# Patient Record
Sex: Female | Born: 1965
Health system: Southern US, Community
[De-identification: ages and names within clinical notes are randomized; demographics above are authoritative.]

## PROBLEM LIST (undated history)

## (undated) DIAGNOSIS — Z992 Dependence on renal dialysis: Secondary | ICD-10-CM

## (undated) DIAGNOSIS — T7840XA Allergy, unspecified, initial encounter: Secondary | ICD-10-CM

## (undated) DIAGNOSIS — K449 Diaphragmatic hernia without obstruction or gangrene: Secondary | ICD-10-CM

## (undated) DIAGNOSIS — J449 Chronic obstructive pulmonary disease, unspecified: Secondary | ICD-10-CM

## (undated) DIAGNOSIS — E7800 Pure hypercholesterolemia, unspecified: Secondary | ICD-10-CM

## (undated) DIAGNOSIS — F17200 Nicotine dependence, unspecified, uncomplicated: Secondary | ICD-10-CM

## (undated) DIAGNOSIS — E1140 Type 2 diabetes mellitus with diabetic neuropathy, unspecified: Secondary | ICD-10-CM

## (undated) DIAGNOSIS — F32A Depression, unspecified: Secondary | ICD-10-CM

## (undated) DIAGNOSIS — E118 Type 2 diabetes mellitus with unspecified complications: Secondary | ICD-10-CM

## (undated) DIAGNOSIS — L988 Other specified disorders of the skin and subcutaneous tissue: Secondary | ICD-10-CM

## (undated) DIAGNOSIS — M1990 Unspecified osteoarthritis, unspecified site: Secondary | ICD-10-CM

## (undated) DIAGNOSIS — K7460 Unspecified cirrhosis of liver: Secondary | ICD-10-CM

## (undated) DIAGNOSIS — I340 Nonrheumatic mitral (valve) insufficiency: Secondary | ICD-10-CM

## (undated) DIAGNOSIS — Z972 Presence of dental prosthetic device (complete) (partial): Secondary | ICD-10-CM

## (undated) DIAGNOSIS — N186 End stage renal disease: Secondary | ICD-10-CM

## (undated) DIAGNOSIS — K759 Inflammatory liver disease, unspecified: Secondary | ICD-10-CM

## (undated) DIAGNOSIS — J189 Pneumonia, unspecified organism: Secondary | ICD-10-CM

## (undated) DIAGNOSIS — F419 Anxiety disorder, unspecified: Secondary | ICD-10-CM

## (undated) DIAGNOSIS — D649 Anemia, unspecified: Secondary | ICD-10-CM

## (undated) DIAGNOSIS — F329 Major depressive disorder, single episode, unspecified: Secondary | ICD-10-CM

## (undated) DIAGNOSIS — L409 Psoriasis, unspecified: Secondary | ICD-10-CM

## (undated) DIAGNOSIS — I739 Peripheral vascular disease, unspecified: Secondary | ICD-10-CM

## (undated) DIAGNOSIS — R079 Chest pain, unspecified: Secondary | ICD-10-CM

## (undated) DIAGNOSIS — I5189 Other ill-defined heart diseases: Secondary | ICD-10-CM

## (undated) DIAGNOSIS — R270 Ataxia, unspecified: Secondary | ICD-10-CM

## (undated) DIAGNOSIS — G43909 Migraine, unspecified, not intractable, without status migrainosus: Secondary | ICD-10-CM

## (undated) DIAGNOSIS — R002 Palpitations: Secondary | ICD-10-CM

## (undated) DIAGNOSIS — N289 Disorder of kidney and ureter, unspecified: Secondary | ICD-10-CM

## (undated) DIAGNOSIS — I10 Essential (primary) hypertension: Secondary | ICD-10-CM

## (undated) HISTORY — DX: Allergy, unspecified, initial encounter: T78.40XA

## (undated) HISTORY — DX: Ataxia, unspecified: R27.0

## (undated) HISTORY — PX: TUBAL LIGATION: SHX77

## (undated) HISTORY — DX: Palpitations: R00.2

## (undated) HISTORY — DX: End stage renal disease: Z99.2

## (undated) HISTORY — DX: Nonrheumatic mitral (valve) insufficiency: I34.0

## (undated) HISTORY — PX: CHOLECYSTECTOMY: SHX55

## (undated) HISTORY — DX: Other ill-defined heart diseases: I51.89

## (undated) HISTORY — DX: Chest pain, unspecified: R07.9

## (undated) HISTORY — DX: Migraine, unspecified, not intractable, without status migrainosus: G43.909

## (undated) HISTORY — PX: OTHER SURGICAL HISTORY: SHX169

## (undated) HISTORY — DX: Diaphragmatic hernia without obstruction or gangrene: K44.9

## (undated) HISTORY — DX: Psoriasis, unspecified: L40.9

## (undated) HISTORY — DX: Type 2 diabetes mellitus with unspecified complications: E11.8

## (undated) HISTORY — DX: Dependence on renal dialysis: N18.6

## (undated) HISTORY — PX: TONSILLECTOMY: SUR1361

---

## 1987-12-22 HISTORY — PX: OTHER SURGICAL HISTORY: SHX169

## 2001-12-21 DIAGNOSIS — K759 Inflammatory liver disease, unspecified: Secondary | ICD-10-CM

## 2001-12-21 HISTORY — DX: Inflammatory liver disease, unspecified: K75.9

## 2009-05-08 ENCOUNTER — Emergency Department: Payer: Self-pay | Admitting: Emergency Medicine

## 2009-07-23 ENCOUNTER — Ambulatory Visit: Payer: Self-pay

## 2010-08-13 ENCOUNTER — Emergency Department: Payer: Self-pay | Admitting: Emergency Medicine

## 2012-09-25 ENCOUNTER — Emergency Department: Payer: Self-pay | Admitting: Unknown Physician Specialty

## 2012-09-25 LAB — URINALYSIS, COMPLETE
Leukocyte Esterase: NEGATIVE
Ph: 5 (ref 4.5–8.0)
Protein: 150
RBC,UR: 25 /HPF (ref 0–5)
Squamous Epithelial: 3

## 2012-09-25 LAB — COMPREHENSIVE METABOLIC PANEL
Albumin: 2.5 g/dL — ABNORMAL LOW (ref 3.4–5.0)
Alkaline Phosphatase: 72 U/L (ref 50–136)
Anion Gap: 7 (ref 7–16)
BUN: 28 mg/dL — ABNORMAL HIGH (ref 7–18)
Bilirubin,Total: 0.3 mg/dL (ref 0.2–1.0)
Calcium, Total: 9 mg/dL (ref 8.5–10.1)
Chloride: 103 mmol/L (ref 98–107)
Co2: 25 mmol/L (ref 21–32)
Creatinine: 1.19 mg/dL (ref 0.60–1.30)
EGFR (African American): >60
EGFR (Non-African Amer.): 54 — ABNORMAL LOW
Osmolality: 294 (ref 275–301)
SGPT (ALT): 81 U/L — ABNORMAL HIGH (ref 12–78)

## 2012-09-25 LAB — CBC
HCT: 29.4 % — ABNORMAL LOW (ref 35.0–47.0)
HGB: 10.3 g/dL — ABNORMAL LOW (ref 12.0–16.0)
MCH: 29.3 pg (ref 26.0–34.0)
MCV: 83 fL (ref 80–100)
Platelet: 186 10*3/uL (ref 150–440)
RBC: 3.54 10*6/uL — ABNORMAL LOW (ref 3.80–5.20)
WBC: 5 10*3/uL (ref 3.6–11.0)

## 2012-09-27 LAB — URINE CULTURE

## 2012-10-01 LAB — CULTURE, BLOOD (SINGLE)

## 2012-11-14 DIAGNOSIS — L4050 Arthropathic psoriasis, unspecified: Secondary | ICD-10-CM | POA: Insufficient documentation

## 2012-12-04 ENCOUNTER — Emergency Department: Payer: Self-pay | Admitting: Emergency Medicine

## 2012-12-04 LAB — COMPREHENSIVE METABOLIC PANEL
Alkaline Phosphatase: 69 U/L (ref 50–136)
Anion Gap: 6 — ABNORMAL LOW (ref 7–16)
Calcium, Total: 8.3 mg/dL — ABNORMAL LOW (ref 8.5–10.1)
Chloride: 105 mmol/L (ref 98–107)
Co2: 21 mmol/L (ref 21–32)
EGFR (African American): >60
EGFR (Non-African Amer.): 52 — ABNORMAL LOW
Osmolality: 286 (ref 275–301)
SGPT (ALT): 80 U/L — ABNORMAL HIGH (ref 12–78)
Sodium: 132 mmol/L — ABNORMAL LOW (ref 136–145)

## 2012-12-04 LAB — CBC
HCT: 32.3 % — ABNORMAL LOW (ref 35.0–47.0)
HGB: 10.8 g/dL — ABNORMAL LOW (ref 12.0–16.0)
MCH: 27.4 pg (ref 26.0–34.0)
MCHC: 33.4 g/dL (ref 32.0–36.0)

## 2012-12-04 LAB — TROPONIN I: Troponin-I: <0.02 ng/mL

## 2012-12-04 LAB — CK TOTAL AND CKMB (NOT AT ARMC): CK, Total: 27 U/L (ref 21–215)

## 2012-12-29 ENCOUNTER — Emergency Department: Payer: Self-pay | Admitting: Unknown Physician Specialty

## 2012-12-29 LAB — URINALYSIS, COMPLETE
Leukocyte Esterase: NEGATIVE
Nitrite: NEGATIVE
Protein: >=500
Specific Gravity: 1.016 (ref 1.003–1.030)
Squamous Epithelial: 6
WBC UR: 3 /HPF (ref 0–5)

## 2012-12-29 LAB — COMPREHENSIVE METABOLIC PANEL
Alkaline Phosphatase: 69 U/L (ref 50–136)
Anion Gap: 4 — ABNORMAL LOW (ref 7–16)
Calcium, Total: 8.7 mg/dL (ref 8.5–10.1)
Creatinine: 1.02 mg/dL (ref 0.60–1.30)
EGFR (African American): >60
EGFR (Non-African Amer.): >60
Glucose: 102 mg/dL — ABNORMAL HIGH (ref 65–99)
Osmolality: 284 (ref 275–301)
Potassium: 5.5 mmol/L — ABNORMAL HIGH (ref 3.5–5.1)
Sodium: 139 mmol/L (ref 136–145)
Total Protein: 7.8 g/dL (ref 6.4–8.2)

## 2012-12-29 LAB — CBC
HCT: 26.3 % — ABNORMAL LOW (ref 35.0–47.0)
HGB: 8.9 g/dL — ABNORMAL LOW (ref 12.0–16.0)
MCH: 27.9 pg (ref 26.0–34.0)
MCHC: 33.7 g/dL (ref 32.0–36.0)
MCV: 83 fL (ref 80–100)
Platelet: 149 10*3/uL — ABNORMAL LOW (ref 150–440)
WBC: 3.2 10*3/uL — ABNORMAL LOW (ref 3.6–11.0)

## 2012-12-30 LAB — PROTIME-INR: Prothrombin Time: 13 secs (ref 11.5–14.7)

## 2012-12-30 LAB — LIPASE, BLOOD: Lipase: 124 U/L (ref 73–393)

## 2012-12-30 LAB — MAGNESIUM: Magnesium: 1.7 mg/dL — ABNORMAL LOW

## 2013-03-30 LAB — COMPREHENSIVE METABOLIC PANEL
Albumin: 1.8 g/dL — ABNORMAL LOW (ref 3.4–5.0)
Alkaline Phosphatase: 80 U/L (ref 50–136)
BUN: 31 mg/dL — ABNORMAL HIGH (ref 7–18)
Chloride: 102 mmol/L (ref 98–107)
Co2: 23 mmol/L (ref 21–32)
Creatinine: 1.38 mg/dL — ABNORMAL HIGH (ref 0.60–1.30)
EGFR (African American): 52 — ABNORMAL LOW
EGFR (Non-African Amer.): 45 — ABNORMAL LOW
Glucose: 547 mg/dL (ref 65–99)
Osmolality: 296 (ref 275–301)
SGOT(AST): 19 U/L (ref 15–37)
Sodium: 132 mmol/L — ABNORMAL LOW (ref 136–145)
Total Protein: 7.1 g/dL (ref 6.4–8.2)

## 2013-03-30 LAB — CBC
HCT: 28.6 % — ABNORMAL LOW (ref 35.0–47.0)
HGB: 9.8 g/dL — ABNORMAL LOW (ref 12.0–16.0)
MCH: 28.7 pg (ref 26.0–34.0)
MCHC: 34.2 g/dL (ref 32.0–36.0)
Platelet: 160 10*3/uL (ref 150–440)
RBC: 3.42 10*6/uL — ABNORMAL LOW (ref 3.80–5.20)
RDW: 13.5 % (ref 11.5–14.5)

## 2013-03-30 LAB — TROPONIN I: Troponin-I: <0.02 ng/mL

## 2013-03-31 ENCOUNTER — Inpatient Hospital Stay: Payer: Self-pay | Admitting: Family Medicine

## 2013-03-31 LAB — URINALYSIS, COMPLETE
Bilirubin,UR: NEGATIVE
Glucose,UR: >=500 mg/dL (ref 0–75)
Ketone: NEGATIVE
Leukocyte Esterase: NEGATIVE
Nitrite: NEGATIVE
Ph: 6 (ref 4.5–8.0)
RBC,UR: 22 /HPF (ref 0–5)
WBC UR: 4 /HPF (ref 0–5)

## 2013-03-31 LAB — PREGNANCY, URINE: Pregnancy Test, Urine: NEGATIVE m[IU]/mL

## 2013-04-01 LAB — CBC WITH DIFFERENTIAL/PLATELET
Basophil #: 0 10*3/uL (ref 0.0–0.1)
Basophil %: 0.9 %
Eosinophil %: 1.8 %
Lymphocyte #: 1 10*3/uL (ref 1.0–3.6)
Lymphocyte %: 28.7 %
MCH: 28.3 pg (ref 26.0–34.0)
MCHC: 34.2 g/dL (ref 32.0–36.0)
Monocyte #: 0.4 x10 3/mm (ref 0.2–0.9)
RBC: 2.85 10*6/uL — ABNORMAL LOW (ref 3.80–5.20)
RDW: 13.2 % (ref 11.5–14.5)
WBC: 3.5 10*3/uL — ABNORMAL LOW (ref 3.6–11.0)

## 2013-04-01 LAB — HEMOGLOBIN A1C: Hemoglobin A1C: 13.1 % — ABNORMAL HIGH (ref 4.2–6.3)

## 2013-04-01 LAB — BASIC METABOLIC PANEL
Calcium, Total: 7.9 mg/dL — ABNORMAL LOW (ref 8.5–10.1)
Chloride: 104 mmol/L (ref 98–107)
Co2: 23 mmol/L (ref 21–32)
Sodium: 133 mmol/L — ABNORMAL LOW (ref 136–145)

## 2013-04-02 LAB — BASIC METABOLIC PANEL
Anion Gap: 7 (ref 7–16)
Chloride: 106 mmol/L (ref 98–107)
Creatinine: 1.28 mg/dL (ref 0.60–1.30)
EGFR (Non-African Amer.): 49 — ABNORMAL LOW
Glucose: 124 mg/dL — ABNORMAL HIGH (ref 65–99)
Osmolality: 276 (ref 275–301)
Sodium: 136 mmol/L (ref 136–145)

## 2013-04-02 LAB — CBC WITH DIFFERENTIAL/PLATELET
Eosinophil #: 0.1 10*3/uL (ref 0.0–0.7)
Eosinophil %: 1.8 %
HCT: 24 % — ABNORMAL LOW (ref 35.0–47.0)
Lymphocyte %: 23.6 %
Monocyte #: 0.4 x10 3/mm (ref 0.2–0.9)
Neutrophil #: 2.1 10*3/uL (ref 1.4–6.5)
Neutrophil %: 63.3 %
Platelet: 182 10*3/uL (ref 150–440)
RBC: 2.89 10*6/uL — ABNORMAL LOW (ref 3.80–5.20)

## 2013-04-03 LAB — BASIC METABOLIC PANEL
Anion Gap: 6 — ABNORMAL LOW (ref 7–16)
Calcium, Total: 8.1 mg/dL — ABNORMAL LOW (ref 8.5–10.1)
Co2: 22 mmol/L (ref 21–32)
EGFR (Non-African Amer.): 54 — ABNORMAL LOW
Glucose: 109 mg/dL — ABNORMAL HIGH (ref 65–99)
Sodium: 138 mmol/L (ref 136–145)

## 2013-04-03 LAB — IRON AND TIBC
Iron Saturation: 28 %
Iron: 54 ug/dL (ref 50–170)
Unbound Iron-Bind.Cap.: 141 ug/dL

## 2013-04-04 LAB — CULTURE, BLOOD (SINGLE)

## 2013-04-13 LAB — CBC WITH DIFFERENTIAL/PLATELET
Basophil #: 0 10*3/uL (ref 0.0–0.1)
Basophil %: 0.8 %
Eosinophil #: 0.1 10*3/uL (ref 0.0–0.7)
Eosinophil %: 1.2 %
HCT: 27.1 % — ABNORMAL LOW (ref 35.0–47.0)
HGB: 9.3 g/dL — ABNORMAL LOW (ref 12.0–16.0)
MCHC: 34.3 g/dL (ref 32.0–36.0)
Monocyte #: 0.4 x10 3/mm (ref 0.2–0.9)
Monocyte %: 6.3 %
Neutrophil %: 68.1 %
RBC: 3.25 10*6/uL — ABNORMAL LOW (ref 3.80–5.20)
RDW: 14.1 % (ref 11.5–14.5)
WBC: 5.8 10*3/uL (ref 3.6–11.0)

## 2013-04-13 LAB — COMPREHENSIVE METABOLIC PANEL
Albumin: 1.8 g/dL — ABNORMAL LOW (ref 3.4–5.0)
Alkaline Phosphatase: 92 U/L (ref 50–136)
Chloride: 107 mmol/L (ref 98–107)
EGFR (African American): 50 — ABNORMAL LOW
EGFR (Non-African Amer.): 43 — ABNORMAL LOW
Glucose: 227 mg/dL — ABNORMAL HIGH (ref 65–99)
Osmolality: 289 (ref 275–301)
Potassium: 4.7 mmol/L (ref 3.5–5.1)

## 2013-04-13 LAB — PRO B NATRIURETIC PEPTIDE: B-Type Natriuretic Peptide: 2289 pg/mL — ABNORMAL HIGH (ref 0–125)

## 2013-04-13 LAB — TROPONIN I: Troponin-I: <0.02 ng/mL

## 2013-04-13 LAB — TSH: Thyroid Stimulating Horm: 2.84 u[IU]/mL

## 2013-04-14 ENCOUNTER — Inpatient Hospital Stay: Payer: Self-pay | Admitting: Internal Medicine

## 2013-04-14 LAB — TROPONIN I: Troponin-I: <0.02 ng/mL

## 2013-04-14 LAB — URINALYSIS, COMPLETE
Bilirubin,UR: NEGATIVE
Glucose,UR: >=500 mg/dL (ref 0–75)
Leukocyte Esterase: NEGATIVE
Nitrite: NEGATIVE
Protein: >=500
RBC,UR: 24 /HPF (ref 0–5)
Specific Gravity: 1.008 (ref 1.003–1.030)

## 2013-04-14 LAB — CK TOTAL AND CKMB (NOT AT ARMC)
CK, Total: 39 U/L (ref 21–215)
CK, Total: 21 U/L (ref 21–215)
CK-MB: 1.6 ng/mL (ref 0.5–3.6)
CK-MB: 0.9 ng/mL (ref 0.5–3.6)
CK-MB: 0.6 ng/mL (ref 0.5–3.6)

## 2013-04-15 LAB — COMPREHENSIVE METABOLIC PANEL
Albumin: 1.6 g/dL — ABNORMAL LOW (ref 3.4–5.0)
Alkaline Phosphatase: 68 U/L (ref 50–136)
Anion Gap: 4 — ABNORMAL LOW (ref 7–16)
BUN: 40 mg/dL — ABNORMAL HIGH (ref 7–18)
Calcium, Total: 7.8 mg/dL — ABNORMAL LOW (ref 8.5–10.1)
Chloride: 104 mmol/L (ref 98–107)
Creatinine: 1.68 mg/dL — ABNORMAL HIGH (ref 0.60–1.30)
EGFR (African American): 41 — ABNORMAL LOW
Glucose: 139 mg/dL — ABNORMAL HIGH (ref 65–99)
Osmolality: 286 (ref 275–301)
Potassium: 4.8 mmol/L (ref 3.5–5.1)
SGOT(AST): 33 U/L (ref 15–37)
SGPT (ALT): 26 U/L (ref 12–78)
Sodium: 137 mmol/L (ref 136–145)

## 2013-04-15 LAB — CBC WITH DIFFERENTIAL/PLATELET
Basophil %: 1.1 %
HCT: 21.4 % — ABNORMAL LOW (ref 35.0–47.0)
HGB: 7.2 g/dL — ABNORMAL LOW (ref 12.0–16.0)
Lymphocyte #: 1.4 10*3/uL (ref 1.0–3.6)
Lymphocyte %: 33.9 %
MCHC: 33.8 g/dL (ref 32.0–36.0)
MCV: 83 fL (ref 80–100)
Monocyte #: 0.3 x10 3/mm (ref 0.2–0.9)
Monocyte %: 7.7 %
Neutrophil #: 2.3 10*3/uL (ref 1.4–6.5)
Platelet: 128 10*3/uL — ABNORMAL LOW (ref 150–440)
RDW: 14.2 % (ref 11.5–14.5)
WBC: 4.1 10*3/uL (ref 3.6–11.0)

## 2013-04-15 LAB — LIPID PANEL
HDL Cholesterol: 31 mg/dL — ABNORMAL LOW (ref 40–60)
Triglycerides: 156 mg/dL (ref 0–200)
VLDL Cholesterol, Calc: 31 mg/dL (ref 5–40)

## 2013-04-16 LAB — HEMOGLOBIN: HGB: 7.7 g/dL — ABNORMAL LOW (ref 12.0–16.0)

## 2013-04-17 LAB — BASIC METABOLIC PANEL
BUN: 31 mg/dL — ABNORMAL HIGH (ref 7–18)
Calcium, Total: 8.6 mg/dL (ref 8.5–10.1)
Co2: 29 mmol/L (ref 21–32)
EGFR (Non-African Amer.): 41 — ABNORMAL LOW
Glucose: 111 mg/dL — ABNORMAL HIGH (ref 65–99)
Osmolality: 287 (ref 275–301)
Potassium: 4.3 mmol/L (ref 3.5–5.1)

## 2013-04-19 LAB — HCG, QUANTITATIVE, PREGNANCY: Beta Hcg, Quant.: <1 m[IU]/mL — ABNORMAL LOW

## 2013-04-20 LAB — CULTURE, BLOOD (SINGLE)

## 2013-06-02 ENCOUNTER — Emergency Department: Payer: Self-pay | Admitting: Emergency Medicine

## 2013-06-02 LAB — URINALYSIS, COMPLETE
Bilirubin,UR: NEGATIVE
Glucose,UR: 50 mg/dL (ref 0–75)
Hyaline Cast: 14
Ketone: NEGATIVE
Leukocyte Esterase: NEGATIVE
Nitrite: NEGATIVE
Protein: >=500
RBC,UR: 7 /HPF (ref 0–5)

## 2013-06-02 LAB — CBC WITH DIFFERENTIAL/PLATELET
Basophil #: 0.1 10*3/uL (ref 0.0–0.1)
Basophil %: 1 %
Eosinophil #: 0.1 10*3/uL (ref 0.0–0.7)
MCH: 28 pg (ref 26.0–34.0)
MCHC: 34 g/dL (ref 32.0–36.0)
MCV: 82 fL (ref 80–100)
Monocyte #: 0.4 x10 3/mm (ref 0.2–0.9)
Neutrophil #: 2.5 10*3/uL (ref 1.4–6.5)
Platelet: 203 10*3/uL (ref 150–440)
RBC: 3.78 10*6/uL — ABNORMAL LOW (ref 3.80–5.20)
RDW: 14.2 % (ref 11.5–14.5)
WBC: 6 10*3/uL (ref 3.6–11.0)

## 2013-06-02 LAB — COMPREHENSIVE METABOLIC PANEL
Alkaline Phosphatase: 63 U/L (ref 50–136)
Anion Gap: 7 (ref 7–16)
BUN: 51 mg/dL — ABNORMAL HIGH (ref 7–18)
Bilirubin,Total: 0.4 mg/dL (ref 0.2–1.0)
Calcium, Total: 9.1 mg/dL (ref 8.5–10.1)
Chloride: 108 mmol/L — ABNORMAL HIGH (ref 98–107)
Co2: 22 mmol/L (ref 21–32)
EGFR (African American): 39 — ABNORMAL LOW
EGFR (Non-African Amer.): 34 — ABNORMAL LOW
Glucose: 198 mg/dL — ABNORMAL HIGH (ref 65–99)
Potassium: 5.4 mmol/L — ABNORMAL HIGH (ref 3.5–5.1)
SGOT(AST): 36 U/L (ref 15–37)
Sodium: 137 mmol/L (ref 136–145)
Total Protein: 8 g/dL (ref 6.4–8.2)

## 2013-06-02 LAB — LIPASE, BLOOD: Lipase: 168 U/L (ref 73–393)

## 2013-08-24 DIAGNOSIS — E113599 Type 2 diabetes mellitus with proliferative diabetic retinopathy without macular edema, unspecified eye: Secondary | ICD-10-CM | POA: Insufficient documentation

## 2013-08-24 DIAGNOSIS — E11311 Type 2 diabetes mellitus with unspecified diabetic retinopathy with macular edema: Secondary | ICD-10-CM | POA: Insufficient documentation

## 2013-08-24 DIAGNOSIS — E113493 Type 2 diabetes mellitus with severe nonproliferative diabetic retinopathy without macular edema, bilateral: Secondary | ICD-10-CM | POA: Insufficient documentation

## 2013-08-24 DIAGNOSIS — H35033 Hypertensive retinopathy, bilateral: Secondary | ICD-10-CM | POA: Insufficient documentation

## 2013-12-21 DIAGNOSIS — J189 Pneumonia, unspecified organism: Secondary | ICD-10-CM

## 2013-12-21 HISTORY — DX: Pneumonia, unspecified organism: J18.9

## 2014-10-14 LAB — CBC
HCT: 29.8 % — AB (ref 35.0–47.0)
HGB: 9.7 g/dL — ABNORMAL LOW (ref 12.0–16.0)
MCH: 28 pg (ref 26.0–34.0)
MCHC: 32.5 g/dL (ref 32.0–36.0)
MCV: 86 fL (ref 80–100)
Platelet: 207 10*3/uL (ref 150–440)
RBC: 3.46 10*6/uL — ABNORMAL LOW (ref 3.80–5.20)
RDW: 14.5 % (ref 11.5–14.5)
WBC: 10.9 10*3/uL (ref 3.6–11.0)

## 2014-10-14 LAB — BASIC METABOLIC PANEL
Anion Gap: 11 (ref 7–16)
BUN: 82 mg/dL — AB (ref 7–18)
Calcium, Total: 7.9 mg/dL — ABNORMAL LOW (ref 8.5–10.1)
Chloride: 108 mmol/L — ABNORMAL HIGH (ref 98–107)
Co2: 18 mmol/L — ABNORMAL LOW (ref 21–32)
Creatinine: 4.22 mg/dL — ABNORMAL HIGH (ref 0.60–1.30)
EGFR (Non-African Amer.): 12 — ABNORMAL LOW
GFR CALC AF AMER: 14 — AB
Glucose: 240 mg/dL — ABNORMAL HIGH (ref 65–99)
OSMOLALITY: 306 (ref 275–301)
POTASSIUM: 5 mmol/L (ref 3.5–5.1)
Sodium: 137 mmol/L (ref 136–145)

## 2014-10-14 LAB — TROPONIN I: Troponin-I: <0.02 ng/mL

## 2014-10-15 ENCOUNTER — Inpatient Hospital Stay: Payer: Self-pay | Admitting: Internal Medicine

## 2014-10-15 LAB — COMPREHENSIVE METABOLIC PANEL
ALBUMIN: 2.6 g/dL — AB (ref 3.4–5.0)
Alkaline Phosphatase: 60 U/L
Anion Gap: 9 (ref 7–16)
BUN: 78 mg/dL — ABNORMAL HIGH (ref 7–18)
Bilirubin,Total: 0.2 mg/dL (ref 0.2–1.0)
CALCIUM: 7.4 mg/dL — AB (ref 8.5–10.1)
CHLORIDE: 110 mmol/L — AB (ref 98–107)
Co2: 18 mmol/L — ABNORMAL LOW (ref 21–32)
Creatinine: 4.02 mg/dL — ABNORMAL HIGH (ref 0.60–1.30)
EGFR (Non-African Amer.): 13 — ABNORMAL LOW
GFR CALC AF AMER: 15 — AB
GLUCOSE: 363 mg/dL — AB (ref 65–99)
Osmolality: 312 (ref 275–301)
Potassium: 5.4 mmol/L — ABNORMAL HIGH (ref 3.5–5.1)
SGOT(AST): 46 U/L — ABNORMAL HIGH (ref 15–37)
SGPT (ALT): 54 U/L
Sodium: 137 mmol/L (ref 136–145)
Total Protein: 7.4 g/dL (ref 6.4–8.2)

## 2014-10-15 LAB — LIPID PANEL
Cholesterol: 157 mg/dL (ref 0–200)
HDL: 49 mg/dL (ref 40–60)
Ldl Cholesterol, Calc: 91 mg/dL (ref 0–100)
Triglycerides: 83 mg/dL (ref 0–200)
VLDL CHOLESTEROL, CALC: 17 mg/dL (ref 5–40)

## 2014-10-15 LAB — PROTIME-INR
INR: 1.1
Prothrombin Time: 14.1 secs (ref 11.5–14.7)

## 2014-10-15 LAB — IRON AND TIBC
IRON SATURATION: 36 %
Iron Bind.Cap.(Total): 278 ug/dL (ref 250–450)
Iron: 99 ug/dL (ref 50–170)
UNBOUND IRON-BIND. CAP.: 179 ug/dL

## 2014-10-15 LAB — HEMOGLOBIN A1C: Hemoglobin A1C: 7.6 % — ABNORMAL HIGH (ref 4.2–6.3)

## 2014-10-15 LAB — PHOSPHORUS: Phosphorus: 5 mg/dL — ABNORMAL HIGH (ref 2.5–4.9)

## 2014-10-15 LAB — APTT: ACTIVATED PTT: 47.2 s — AB (ref 23.6–35.9)

## 2014-10-15 LAB — FERRITIN: FERRITIN (ARMC): 48 ng/mL (ref 8–388)

## 2014-10-16 LAB — CBC WITH DIFFERENTIAL/PLATELET
BASOS ABS: 0 10*3/uL (ref 0.0–0.1)
Basophil %: 0.1 %
EOS PCT: 0 %
Eosinophil #: 0 10*3/uL (ref 0.0–0.7)
HCT: 28.7 % — AB (ref 35.0–47.0)
HGB: 8.9 g/dL — ABNORMAL LOW (ref 12.0–16.0)
LYMPHS ABS: 0.8 10*3/uL — AB (ref 1.0–3.6)
Lymphocyte %: 5 %
MCH: 27.6 pg (ref 26.0–34.0)
MCHC: 31.2 g/dL — ABNORMAL LOW (ref 32.0–36.0)
MCV: 88 fL (ref 80–100)
MONOS PCT: 4.7 %
Monocyte #: 0.7 x10 3/mm (ref 0.2–0.9)
NEUTROS ABS: 14 10*3/uL — AB (ref 1.4–6.5)
Neutrophil %: 90.2 %
PLATELETS: 202 10*3/uL (ref 150–440)
RBC: 3.24 10*6/uL — ABNORMAL LOW (ref 3.80–5.20)
RDW: 14.8 % — AB (ref 11.5–14.5)
WBC: 15.5 10*3/uL — ABNORMAL HIGH (ref 3.6–11.0)

## 2014-10-16 LAB — BASIC METABOLIC PANEL
Anion Gap: 9 (ref 7–16)
BUN: 81 mg/dL — AB (ref 7–18)
Calcium, Total: 7.9 mg/dL — ABNORMAL LOW (ref 8.5–10.1)
Chloride: 115 mmol/L — ABNORMAL HIGH (ref 98–107)
Co2: 17 mmol/L — ABNORMAL LOW (ref 21–32)
Creatinine: 3.88 mg/dL — ABNORMAL HIGH (ref 0.60–1.30)
EGFR (Non-African Amer.): 13 — ABNORMAL LOW
GFR CALC AF AMER: 16 — AB
GLUCOSE: 112 mg/dL — AB (ref 65–99)
Osmolality: 306 (ref 275–301)
Potassium: 5.7 mmol/L — ABNORMAL HIGH (ref 3.5–5.1)
Sodium: 141 mmol/L (ref 136–145)

## 2014-10-16 LAB — POTASSIUM: Potassium: 4.8 mmol/L (ref 3.5–5.1)

## 2014-10-17 LAB — BASIC METABOLIC PANEL
ANION GAP: 12 (ref 7–16)
BUN: 84 mg/dL — AB (ref 7–18)
Calcium, Total: 7.7 mg/dL — ABNORMAL LOW (ref 8.5–10.1)
Chloride: 113 mmol/L — ABNORMAL HIGH (ref 98–107)
Co2: 18 mmol/L — ABNORMAL LOW (ref 21–32)
Creatinine: 3.53 mg/dL — ABNORMAL HIGH (ref 0.60–1.30)
EGFR (African American): 18 — ABNORMAL LOW
EGFR (Non-African Amer.): 15 — ABNORMAL LOW
Glucose: 128 mg/dL — ABNORMAL HIGH (ref 65–99)
Osmolality: 312 (ref 275–301)
POTASSIUM: 4.1 mmol/L (ref 3.5–5.1)
Sodium: 143 mmol/L (ref 136–145)

## 2014-10-17 LAB — CBC WITH DIFFERENTIAL/PLATELET
BASOS ABS: 0 10*3/uL (ref 0.0–0.1)
Basophil %: 0.2 %
EOS PCT: 0 %
Eosinophil #: 0 10*3/uL (ref 0.0–0.7)
HCT: 29.3 % — AB (ref 35.0–47.0)
HGB: 9.2 g/dL — ABNORMAL LOW (ref 12.0–16.0)
LYMPHS PCT: 4.8 %
Lymphocyte #: 0.7 10*3/uL — ABNORMAL LOW (ref 1.0–3.6)
MCH: 27.1 pg (ref 26.0–34.0)
MCHC: 31.3 g/dL — AB (ref 32.0–36.0)
MCV: 87 fL (ref 80–100)
MONO ABS: 0.6 x10 3/mm (ref 0.2–0.9)
Monocyte %: 4 %
Neutrophil #: 13.3 10*3/uL — ABNORMAL HIGH (ref 1.4–6.5)
Neutrophil %: 91 %
Platelet: 200 10*3/uL (ref 150–440)
RBC: 3.37 10*6/uL — AB (ref 3.80–5.20)
RDW: 14.3 % (ref 11.5–14.5)
WBC: 14.6 10*3/uL — ABNORMAL HIGH (ref 3.6–11.0)

## 2014-10-17 LAB — PROTEIN ELECTROPHORESIS(ARMC)

## 2014-10-17 LAB — EXPECTORATED SPUTUM ASSESSMENT W GRAM STAIN, RFLX TO RESP C

## 2014-10-18 LAB — BASIC METABOLIC PANEL
Anion Gap: 9 (ref 7–16)
BUN: 94 mg/dL — ABNORMAL HIGH (ref 7–18)
Calcium, Total: 7.6 mg/dL — ABNORMAL LOW (ref 8.5–10.1)
Chloride: 106 mmol/L (ref 98–107)
Co2: 23 mmol/L (ref 21–32)
Creatinine: 3.64 mg/dL — ABNORMAL HIGH (ref 0.60–1.30)
GFR CALC AF AMER: 17 — AB
GFR CALC NON AF AMER: 14 — AB
Glucose: 282 mg/dL — ABNORMAL HIGH (ref 65–99)
OSMOLALITY: 315 (ref 275–301)
Potassium: 3.9 mmol/L (ref 3.5–5.1)
Sodium: 138 mmol/L (ref 136–145)

## 2014-10-18 LAB — URINALYSIS, COMPLETE
Bilirubin,UR: NEGATIVE
Glucose,UR: >=500 mg/dL (ref 0–75)
KETONE: NEGATIVE
NITRITE: NEGATIVE
Ph: 5 (ref 4.5–8.0)
Protein: >=500
RBC,UR: 6 /HPF (ref 0–5)
SPECIFIC GRAVITY: 1.016 (ref 1.003–1.030)
Squamous Epithelial: 14

## 2014-10-18 LAB — PROTEIN / CREATININE RATIO, URINE
Creatinine, Urine: 54.6 mg/dL (ref 30.0–125.0)
PROTEIN/CREAT. RATIO: 7088 mg/g{creat} — AB (ref 0–200)
Protein, Random Urine: 387 mg/dL — ABNORMAL HIGH (ref 0–12)

## 2014-10-19 LAB — BASIC METABOLIC PANEL
ANION GAP: 10 (ref 7–16)
BUN: 104 mg/dL — AB (ref 7–18)
CALCIUM: 7.5 mg/dL — AB (ref 8.5–10.1)
Chloride: 102 mmol/L (ref 98–107)
Co2: 24 mmol/L (ref 21–32)
Creatinine: 3.73 mg/dL — ABNORMAL HIGH (ref 0.60–1.30)
EGFR (African American): 17 — ABNORMAL LOW
GFR CALC NON AF AMER: 14 — AB
Glucose: 296 mg/dL — ABNORMAL HIGH (ref 65–99)
Osmolality: 316 (ref 275–301)
Potassium: 3.9 mmol/L (ref 3.5–5.1)
SODIUM: 136 mmol/L (ref 136–145)

## 2014-10-19 LAB — CK: CK, Total: 425 U/L — ABNORMAL HIGH

## 2014-10-20 LAB — BASIC METABOLIC PANEL
Anion Gap: 12 (ref 7–16)
BUN: 114 mg/dL — ABNORMAL HIGH (ref 7–18)
CALCIUM: 7.7 mg/dL — AB (ref 8.5–10.1)
CHLORIDE: 103 mmol/L (ref 98–107)
CREATININE: 3.81 mg/dL — AB (ref 0.60–1.30)
Co2: 25 mmol/L (ref 21–32)
EGFR (African American): 16 — ABNORMAL LOW
GFR CALC NON AF AMER: 13 — AB
Glucose: 145 mg/dL — ABNORMAL HIGH (ref 65–99)
Osmolality: 318 (ref 275–301)
Potassium: 4 mmol/L (ref 3.5–5.1)
Sodium: 140 mmol/L (ref 136–145)

## 2014-10-20 LAB — PHOSPHORUS: PHOSPHORUS: 6.7 mg/dL — AB (ref 2.5–4.9)

## 2014-10-20 LAB — CULTURE, BLOOD (SINGLE)

## 2014-10-21 LAB — BASIC METABOLIC PANEL
ANION GAP: 6 — AB (ref 7–16)
BUN: 104 mg/dL — AB (ref 7–18)
CALCIUM: 7.6 mg/dL — AB (ref 8.5–10.1)
CHLORIDE: 104 mmol/L (ref 98–107)
CO2: 27 mmol/L (ref 21–32)
Creatinine: 3.55 mg/dL — ABNORMAL HIGH (ref 0.60–1.30)
GLUCOSE: 190 mg/dL — AB (ref 65–99)
OSMOLALITY: 312 (ref 275–301)
Potassium: 3.9 mmol/L (ref 3.5–5.1)
Sodium: 137 mmol/L (ref 136–145)

## 2014-10-21 LAB — CBC WITH DIFFERENTIAL/PLATELET
Basophil #: 0 10*3/uL (ref 0.0–0.1)
Basophil %: 0.2 %
Eosinophil #: 0 10*3/uL (ref 0.0–0.7)
Eosinophil %: 0.1 %
HCT: 28.4 % — ABNORMAL LOW (ref 35.0–47.0)
HGB: 9.3 g/dL — AB (ref 12.0–16.0)
LYMPHS ABS: 0.4 10*3/uL — AB (ref 1.0–3.6)
Lymphocyte %: 2.7 %
MCH: 28.3 pg (ref 26.0–34.0)
MCHC: 32.8 g/dL (ref 32.0–36.0)
MCV: 86 fL (ref 80–100)
MONO ABS: 0.3 x10 3/mm (ref 0.2–0.9)
MONOS PCT: 2.5 %
NEUTROS PCT: 94.5 %
Neutrophil #: 13.2 10*3/uL — ABNORMAL HIGH (ref 1.4–6.5)
Platelet: 127 10*3/uL — ABNORMAL LOW (ref 150–440)
RBC: 3.29 10*6/uL — AB (ref 3.80–5.20)
RDW: 14.2 % (ref 11.5–14.5)
WBC: 14 10*3/uL — ABNORMAL HIGH (ref 3.6–11.0)

## 2014-10-21 LAB — PLATELET COUNT: Platelet: 126 10*3/uL — ABNORMAL LOW (ref 150–440)

## 2014-10-21 LAB — PHOSPHORUS: PHOSPHORUS: 5.9 mg/dL — AB (ref 2.5–4.9)

## 2014-10-22 LAB — CBC WITH DIFFERENTIAL/PLATELET
BASOS PCT: 0.7 %
Basophil #: 0.1 10*3/uL (ref 0.0–0.1)
EOS ABS: 0.1 10*3/uL (ref 0.0–0.7)
Eosinophil %: 0.5 %
HCT: 27.5 % — ABNORMAL LOW (ref 35.0–47.0)
HGB: 8.9 g/dL — ABNORMAL LOW (ref 12.0–16.0)
Lymphocyte #: 0.4 10*3/uL — ABNORMAL LOW (ref 1.0–3.6)
Lymphocyte %: 3.5 %
MCH: 27.9 pg (ref 26.0–34.0)
MCHC: 32.5 g/dL (ref 32.0–36.0)
MCV: 86 fL (ref 80–100)
MONOS PCT: 4.9 %
Monocyte #: 0.6 x10 3/mm (ref 0.2–0.9)
NEUTROS ABS: 10.3 10*3/uL — AB (ref 1.4–6.5)
NEUTROS PCT: 90.4 %
Platelet: 106 10*3/uL — ABNORMAL LOW (ref 150–440)
RBC: 3.21 10*6/uL — AB (ref 3.80–5.20)
RDW: 13.9 % (ref 11.5–14.5)
WBC: 11.4 10*3/uL — AB (ref 3.6–11.0)

## 2014-10-22 LAB — UR PROT ELECTROPHORESIS, URINE RANDOM

## 2014-10-22 LAB — PHOSPHORUS: PHOSPHORUS: 4 mg/dL (ref 2.5–4.9)

## 2014-10-23 LAB — RENAL FUNCTION PANEL
ALBUMIN: 2 g/dL — AB (ref 3.4–5.0)
Anion Gap: 4 — ABNORMAL LOW (ref 7–16)
BUN: 47 mg/dL — AB (ref 7–18)
CALCIUM: 7.2 mg/dL — AB (ref 8.5–10.1)
CHLORIDE: 101 mmol/L (ref 98–107)
Co2: 34 mmol/L — ABNORMAL HIGH (ref 21–32)
Creatinine: 1.94 mg/dL — ABNORMAL HIGH (ref 0.60–1.30)
EGFR (Non-African Amer.): 29 — ABNORMAL LOW
GFR CALC AF AMER: 35 — AB
Glucose: 170 mg/dL — ABNORMAL HIGH (ref 65–99)
Osmolality: 294 (ref 275–301)
PHOSPHORUS: 3.6 mg/dL (ref 2.5–4.9)
POTASSIUM: 4 mmol/L (ref 3.5–5.1)
Sodium: 139 mmol/L (ref 136–145)

## 2014-10-23 LAB — CBC WITH DIFFERENTIAL/PLATELET
BASOS ABS: 0 10*3/uL (ref 0.0–0.1)
BASOS PCT: 0.1 %
BASOS PCT: 0.4 %
Basophil #: 0.1 10*3/uL (ref 0.0–0.1)
EOS ABS: 0 10*3/uL (ref 0.0–0.7)
EOS ABS: 0.1 10*3/uL (ref 0.0–0.7)
Eosinophil %: 0.1 %
Eosinophil %: 0.6 %
HCT: 27.7 % — ABNORMAL LOW (ref 35.0–47.0)
HCT: 26.4 % — ABNORMAL LOW (ref 35.0–47.0)
HGB: 8.9 g/dL — ABNORMAL LOW (ref 12.0–16.0)
HGB: 8.6 g/dL — AB (ref 12.0–16.0)
LYMPHS PCT: 5.7 %
Lymphocyte #: 0.7 10*3/uL — ABNORMAL LOW (ref 1.0–3.6)
Lymphocyte #: 1 10*3/uL (ref 1.0–3.6)
Lymphocyte %: 8 %
MCH: 28.1 pg (ref 26.0–34.0)
MCH: 28.3 pg (ref 26.0–34.0)
MCHC: 32.3 g/dL (ref 32.0–36.0)
MCHC: 32.5 g/dL (ref 32.0–36.0)
MCV: 87 fL (ref 80–100)
MCV: 87 fL (ref 80–100)
Monocyte #: 0.8 x10 3/mm (ref 0.2–0.9)
Monocyte #: 0.9 x10 3/mm (ref 0.2–0.9)
Monocyte %: 5.9 %
Monocyte %: 6.8 %
NEUTROS ABS: 11.5 10*3/uL — AB (ref 1.4–6.5)
NEUTROS PCT: 84.2 %
Neutrophil #: 11 10*3/uL — ABNORMAL HIGH (ref 1.4–6.5)
Neutrophil %: 88.2 %
Platelet: 115 10*3/uL — ABNORMAL LOW (ref 150–440)
Platelet: 112 10*3/uL — ABNORMAL LOW (ref 150–440)
RBC: 3.18 10*6/uL — AB (ref 3.80–5.20)
RBC: 3.04 10*6/uL — ABNORMAL LOW (ref 3.80–5.20)
RDW: 14 % (ref 11.5–14.5)
RDW: 14.1 % (ref 11.5–14.5)
WBC: 13 10*3/uL — ABNORMAL HIGH (ref 3.6–11.0)
WBC: 13.1 10*3/uL — ABNORMAL HIGH (ref 3.6–11.0)

## 2014-10-26 LAB — CBC WITH DIFFERENTIAL/PLATELET
BASOS PCT: 0.1 %
Basophil #: 0 10*3/uL (ref 0.0–0.1)
EOS PCT: 1.5 %
Eosinophil #: 0.2 10*3/uL (ref 0.0–0.7)
HCT: 22.4 % — ABNORMAL LOW (ref 35.0–47.0)
HGB: 7.3 g/dL — ABNORMAL LOW (ref 12.0–16.0)
LYMPHS PCT: 10.1 %
Lymphocyte #: 1.3 10*3/uL (ref 1.0–3.6)
MCH: 28.5 pg (ref 26.0–34.0)
MCHC: 32.3 g/dL (ref 32.0–36.0)
MCV: 88 fL (ref 80–100)
MONO ABS: 1.3 x10 3/mm — AB (ref 0.2–0.9)
Monocyte %: 10.2 %
NEUTROS ABS: 9.9 10*3/uL — AB (ref 1.4–6.5)
Neutrophil %: 78.1 %
PLATELETS: 95 10*3/uL — AB (ref 150–440)
RBC: 2.55 10*6/uL — AB (ref 3.80–5.20)
RDW: 13.9 % (ref 11.5–14.5)
WBC: 12.7 10*3/uL — ABNORMAL HIGH (ref 3.6–11.0)

## 2014-10-26 LAB — RENAL FUNCTION PANEL
ANION GAP: 4 — AB (ref 7–16)
Albumin: 1.6 g/dL — ABNORMAL LOW (ref 3.4–5.0)
BUN: 38 mg/dL — AB (ref 7–18)
CREATININE: 2.39 mg/dL — AB (ref 0.60–1.30)
Calcium, Total: 6.9 mg/dL — CL (ref 8.5–10.1)
Chloride: 100 mmol/L (ref 98–107)
Co2: 33 mmol/L — ABNORMAL HIGH (ref 21–32)
EGFR (African American): 28 — ABNORMAL LOW
GFR CALC NON AF AMER: 23 — AB
GLUCOSE: 138 mg/dL — AB (ref 65–99)
Osmolality: 285 (ref 275–301)
Phosphorus: 2.5 mg/dL (ref 2.5–4.9)
Potassium: 4.6 mmol/L (ref 3.5–5.1)
Sodium: 137 mmol/L (ref 136–145)

## 2014-10-27 LAB — RENAL FUNCTION PANEL
ANION GAP: 3 — AB (ref 7–16)
Albumin: 1.8 g/dL — ABNORMAL LOW (ref 3.4–5.0)
BUN: 36 mg/dL — AB (ref 7–18)
Calcium, Total: 7.4 mg/dL — ABNORMAL LOW (ref 8.5–10.1)
Chloride: 99 mmol/L (ref 98–107)
Co2: 31 mmol/L (ref 21–32)
Creatinine: 2.62 mg/dL — ABNORMAL HIGH (ref 0.60–1.30)
GFR CALC AF AMER: 25 — AB
GFR CALC NON AF AMER: 21 — AB
Glucose: 99 mg/dL (ref 65–99)
Osmolality: 275 (ref 275–301)
Phosphorus: 2.7 mg/dL (ref 2.5–4.9)
Potassium: 4.5 mmol/L (ref 3.5–5.1)
Sodium: 133 mmol/L — ABNORMAL LOW (ref 136–145)

## 2014-10-27 LAB — CREATININE CLEARANCE, URINE, 24 HOUR
COLLECTION HOURS: 24 h
CREATININE, URINE: 46.4 mg/dL (ref 30.0–125.0)
Creatinine Clearance: 17 mL/min — ABNORMAL LOW (ref 80–130)
Creatinine, Serum: 2.62 mg/dL — ABNORMAL HIGH (ref 0.60–1.30)
Total Volume: 1650 mL

## 2014-10-28 LAB — PLATELET COUNT: Platelet: 125 10*3/uL — ABNORMAL LOW (ref 150–440)

## 2014-10-29 LAB — CBC WITH DIFFERENTIAL/PLATELET
Basophil #: 0.1 10*3/uL (ref 0.0–0.1)
Basophil %: 0.6 %
Eosinophil #: 0.2 10*3/uL (ref 0.0–0.7)
Eosinophil %: 1.9 %
HCT: 23 % — ABNORMAL LOW (ref 35.0–47.0)
HGB: 7.6 g/dL — AB (ref 12.0–16.0)
LYMPHS PCT: 11 %
Lymphocyte #: 1 10*3/uL (ref 1.0–3.6)
MCH: 28.7 pg (ref 26.0–34.0)
MCHC: 33 g/dL (ref 32.0–36.0)
MCV: 87 fL (ref 80–100)
Monocyte #: 0.9 x10 3/mm (ref 0.2–0.9)
Monocyte %: 10.1 %
Neutrophil #: 7 10*3/uL — ABNORMAL HIGH (ref 1.4–6.5)
Neutrophil %: 76.4 %
Platelet: 134 10*3/uL — ABNORMAL LOW (ref 150–440)
RBC: 2.64 10*6/uL — AB (ref 3.80–5.20)
RDW: 13.2 % (ref 11.5–14.5)
WBC: 9.2 10*3/uL (ref 3.6–11.0)

## 2014-10-29 LAB — RENAL FUNCTION PANEL
Albumin: 1.8 g/dL — ABNORMAL LOW (ref 3.4–5.0)
Anion Gap: 5 — ABNORMAL LOW (ref 7–16)
BUN: 35 mg/dL — AB (ref 7–18)
CALCIUM: 7.3 mg/dL — AB (ref 8.5–10.1)
CO2: 30 mmol/L (ref 21–32)
Chloride: 102 mmol/L (ref 98–107)
Creatinine: 2.72 mg/dL — ABNORMAL HIGH (ref 0.60–1.30)
EGFR (Non-African Amer.): 20 — ABNORMAL LOW
GFR CALC AF AMER: 24 — AB
Glucose: 95 mg/dL (ref 65–99)
Osmolality: 282 (ref 275–301)
Phosphorus: 2.5 mg/dL (ref 2.5–4.9)
Potassium: 4.8 mmol/L (ref 3.5–5.1)
SODIUM: 137 mmol/L (ref 136–145)

## 2014-10-31 LAB — BASIC METABOLIC PANEL
ANION GAP: 4 — AB (ref 7–16)
BUN: 35 mg/dL — ABNORMAL HIGH (ref 7–18)
CO2: 27 mmol/L (ref 21–32)
CREATININE: 2.8 mg/dL — AB (ref 0.60–1.30)
Calcium, Total: 7.7 mg/dL — ABNORMAL LOW (ref 8.5–10.1)
Chloride: 102 mmol/L (ref 98–107)
Glucose: 203 mg/dL — ABNORMAL HIGH (ref 65–99)
OSMOLALITY: 280 (ref 275–301)
POTASSIUM: 4.7 mmol/L (ref 3.5–5.1)
Sodium: 133 mmol/L — ABNORMAL LOW (ref 136–145)

## 2014-11-20 HISTORY — PX: AV FISTULA PLACEMENT: SHX1204

## 2015-02-09 ENCOUNTER — Inpatient Hospital Stay: Payer: Self-pay | Admitting: Internal Medicine

## 2015-02-28 ENCOUNTER — Ambulatory Visit: Payer: Self-pay | Admitting: Vascular Surgery

## 2015-04-12 NOTE — H&P (Signed)
PATIENT NAME:  [name], [place] MR#:  [id number] DATE OF BIRTH:  09-02-1966  DATE OF ADMISSION:  04/14/2013  PRIMARY CARE PHYSICIAN:  The patient is following at [facility].  HEPATOLOGY:  Following at [facility].  REFERRING PHYSICIAN:  Dr. Charlesetta Ivory.  CHIEF COMPLAINT:  Lower extremity edema.   HISTORY OF PRESENT ILLNESS:  This is a 49 year old female with significant past medical history of chronic hepatitis C, anemia, diabetes mellitus insulin-dependent, recently discharged from [facility] where she was here for facial cellulitis, discharged on by mouth antibiotic where she did finish her course as an outpatient, the patient has been complaining of lower extremity edema over the last few days, which prompted her to come to the ED, as well she had some complaints of chest pain described as dull, intermittent, no relieving or provoking factor, currently she is chest pain-free.  As well, had some mild shortness of breath, mainly exertional, as well had complaints of some back pain, the patient has no history of congestive heart failure in the past, the patient was found to have elevated BNP level, as well she had evidence of vascular congestion on her chest x-ray, so she was given 40 IV Lasix in ED, as well patient was to have uncontrolled blood pressure with systolic blood pressure in the low 200s, she reports she had history of hypertension, but she is not taking any medications for that, patient's EKG did not show any significant ST or T wave changes, the patient reports she finished her Augmentin yesterday, and she does not have any nasal discharge or drainage, the patient is known to have history of chronic hepatitis C where she is following with Dr. Freddrick March at [facility] hepatology, where she reports she has MRI of her abdomen scheduled this Saturday.   PAST MEDICAL HISTORY: 1.  Chronic hepatitis C, following at [facility] hepatology.  2.  Insulin-dependent diabetes mellitus.   3.  Chronic anemia.   PAST SURGICAL HISTORY: 1.  Cholecystectomy.  2.  C-section.   ALLERGIES:  ACETAMINOPHEN.   HOME MEDICATIONS: 1.  Lantus 30 units subQ at bedtime.  2.  Oxycodone 5 mg every 4 hours as needed.  3.  Regular insulin 5 to 10 units 2 times a day before meals, sliding scale.  4.  Promethazine 25 mg oral every 6 hours as needed.  5.  Doxycycline 100 mg oral 2 times a day for a total of 7 days where she is supposed to finish this by now.  6.  lidoderm 5% topical film.  7.  Baclofen 10 mg oral 3 times a day.  8.  Fluticasone nasal as needed.  9.  Sodium chloride nasal two sprays every 4 hours as needed.   SOCIAL HISTORY:  The patient lives with her boyfriend.  She quit smoking last month.  Denies any alcohol or illicit drug use.    FAMILY HISTORY:  The patient reports significant for heart disease in both of her parents.   REVIEW OF SYSTEMS: CONSTITUTIONAL:  The patient complains of having chills, but she was afebrile in ED, complain of recent weight gain as well.  EYES:  Denies inflammation and glaucoma, eye pain or redness, or any blurred vision.  EARS, NOSE, THROAT:  Denies tinnitus, ear pain, epistaxis, discharge or hearing loss.  RESPIRATORY:  Denies cough, wheezing, hemoptysis, painful respiratory.  Has complaints of shortness of breath.  CARDIOVASCULAR:  Complains of chest pain.  Denies any arrhythmia, palpitations, syncope.  Has complaints of worsening lower  extremity edema.  GASTROINTESTINAL:  Denies any nausea, vomiting, diarrhea.  Had mild complaints of abdominal pain.  Denies melena, rectal bleed, hemorrhoids, constipation.  GENITOURINARY:  Denies dysuria, hematuria, renal colic.  ENDOCRINE:  Denies polyuria, polydipsia, heat or cold intolerance.  HEMATOLOGY:  Has history of chronic anemia.  Denies easy bruising or bleeding diathesis.  INTEGUMENTARY:  Denies any acne, has a skin rash just followed up recently in her right lower extremity, as well has a  pruritic rash in her left elbow.  MUSCULOSKELETAL:  Denies any gout, arthritis.  Has complaints of chronic lower back pain.  NEUROLOGIC:  Denies any seizures, headache, epilepsy, tremors or vertigo.  PSYCHIATRIC:  Has history of anxiety and insomnia and depression.  Denies substance or alcohol abuse.   PHYSICAL EXAMINATION: VITAL SIGNS:  Temperature 98.6, pulse 92, respiratory rate 18, blood pressure 196/91, saturating 100% on room air.  GENERAL:  Well-nourished female who looks comfortable in bed in no apparent distress.  HEENT:  Head atraumatic, normocephalic.  Pupils equal, reactive to light.  Pink conjunctivae.  Anicteric sclerae.  Moist oral mucosa.  NECK:  Supple.  No thyromegaly.  No JVD.  CHEST:  Good air entry bilaterally.  No wheezing, rales, rhonchi.  CARDIOVASCULAR:  S1, S2 heard.  No rubs, murmurs, gallops.  ABDOMEN:  Soft.  Has mild tenderness in the right lower quadrant, but no rebound, no guarding, bowel sounds present.  EXTREMITIES:  Edema +2 to +3 bilaterally, no clubbing.  No cyanosis, dorsal pedis and tibialis pulses +2 bilaterally.  PSYCHIATRIC:  Appropriate affect.  Awake, alert x 3.  Intact judgment and insight.  NEUROLOGIC:  Cranial nerves grossly intact.  Motor 5 out of 5.  SKIN:  Has rash in the right upper extremity, as well has psoriatic lesions in the left elbow and mid back area.   PERTINENT LABORATORY DATA:  ProBNP 2289, glucose 227, BUN 34, creatinine 1.44, sodium 137, potassium 4.7, chloride 107, CO2 25.  Troponin less than 0.02.  TSH 2.84, total protein 7, albumin 1.8, total bili 0.3, alkaline phosphatase 92, AST 42, ALT 31.  White blood cell 5.8, hemoglobin 9.3, hematocrit 27.1, platelets 169.  Urinalysis negative for nitrites and leukocyte esterase and white blood cells.  Chest x-ray showing mild cardiomegaly with vascular congestion.  EKG showing ventricular rate of 96 with normal sinus rhythm with no evidence of ST or T wave changes.   ASSESSMENT AND  PLAN: 1.  Acute congestive heart failure, this is new onset.  No previous history of congestive heart failure, this appears to be exacerbated by uncontrolled blood pressure, this is most likely systolic, we will obtain 2-D echo, we will consult cardiology service, we will continue to cycle patient's troponin, we will have patient on low-dose IV Lasix 20 mg every 12 hours given her acute renal failure.  We will monitor her closely.  We will have strict ins and outs and daily weights, we will avoid starting Ace inhibitor at this point given her acute renal failure, as well we will avoid beta-blockers as it is new onset acute congestive heart failure.  2.  Hypertension, uncontrolled.  We will start the patient on Imdur.  We will have her on as needed hydralazine as well, at one point when she is more stable we will start her on beta-blockers and ACE inhibitor.  3.  History of hepatitis C, the patient is following with [facility] hepatology, was instructed to keep her appointment, the patient having hypoalbuminemia, most likely related to her liver  disease, as well her hypoalbuminemia is very likely contributing to her lower extremity edema and third spacing.  4.  Chest pain, currently patient is chest pain-free.  We will give her 324 of aspirin, we will check lipid panel.  We will continue to cycle her troponins.  5.  Diabetes mellitus.  We will continue patient on Lantus and insulin sliding scale.  6.  Acute renal failure.  We will monitor closely, especially patient is on IV Lasix.  7.  Anemia.  The patient is known to have history of chronic anemia and her hemoglobin actually is improving from previous value, as it went up from 8.1 to 9.3.  8.  Deep vein thrombosis prophylaxis.  SubQ heparin.   CODE STATUS:  FULL CODE.   Total time spent on admission and patient care 55 minutes.    ____________________________ Albertine Patricia, MD dse:ea D: 04/14/2013 01:55:09 ET T: 04/14/2013 03:17:35  ET JOB#: [id number]  cc: Albertine Patricia, MD, <Dictator> Syris Brookens Graciela Husbands MD ELECTRONICALLY SIGNED 04/16/2013 0:21

## 2015-04-12 NOTE — H&P (Signed)
PATIENT NAME:  [name], [name] MR#:  [id number] DATE OF BIRTH:  [birth year]  DATE OF ADMISSION:  03/31/2013  [facility]   HEPATOLOGY:  At [facility]  REFERRING PHYSICIAN:  Dr. Renard Hamper.   CHIEF COMPLAINT:  Facial pain and swollen eyes.   HISTORY OF PRESENT ILLNESS:  The patient is a 49 year old Caucasian female with a past medical history of chronic hepatitis C, anemia, diabetes mellitus, has been experiencing severe pain in the face for the past five days.  She has stuffy nose and then pain in the maxillary area and the forehead area.  Her eyes were swollen and somewhat erythematous.  She denies any blurry vision, but her eye vision is somewhat foggy.  The patient comes to the ER for these complaints and she had CT of the face done in the ER which has revealed acute sinusitis, but no evidence of periorbital abscess.  CT head revealed maxillary and ethmoidal sinusitis.  There is no postseptal edema.  The patient was given Unasyn after obtaining cultures by the ER physician and hospitalist team is called to admit the patient.  The patient has received morphine as well as Toradol for pain and she is reporting that Toradol is helping the pain much better and she is requesting for Toradol again.  No family members are at bedside.  The patient is in tears as she has some transportation issues as well as financial issues.  Denies any fever, but feels warm.   PAST MEDICAL HISTORY:  Chronic hepatitis C regarding which she follows with [facility] hepatologist, insulin-dependent diabetes mellitus, chronic hepatitis C, chronic anemia.   PAST SURGICAL HISTORY:  Cholecystectomy, C-section.   ALLERGIES:  The patient is allergic to ACETAMINOPHEN.   HOME MEDICATIONS:  Lantus 30 units subcutaneous at bedtime, but patient has been not taking for the past several days because of her severe leg cramps and patient believes that those legs cramps are from taking high-dose Lantus.  She is also on insulin  sliding scale.   PSYCHOSOCIAL HISTORY:  Lives with her boyfriend.  She used to smoke, but quit smoking for the past few days.  Denies any alcohol or illicit drug usage.   FAMILY HISTORY:  Both parents have heart problems.   REVIEW OF SYSTEMS:  CONSTITUTIONAL:  Denies any fever, but complaining of weakness, pain in the forehead and maxillary area.  No weight loss.  HEENT:  Her vision is somewhat foggy, but denies any double vision.  No glaucoma or cataracts.  Eyelids are swollen and erythematous.  Extraocular movements are intact.  No scleral icterus.  No conjunctival injection, but pupils are equally reacting to light and accommodation.  Diffuse maxillary and frontal tenderness is present.  Positive ethmoid tenderness.  Moist mucous membranes.  Positive postnasal drip.  RESPIRATORY:  No cough, wheezing, hemoptysis, COPD.  Denies any dyspnea.  CARDIOVASCULAR:  No chest pain, palpitations, syncope.  GASTROINTESTINAL:  No nausea, vomiting, diarrhea, no abdominal pain.  GENITOURINARY:  No dysuria or hematuria.  GYNECOLOGIC AND BREAST:  No breast mass or vaginal discharge. ENDOCRINE:  No polyuria, nocturia, thyroid problems, but complaining of muscle cramps after taking her long-acting insulin Lantus.  HEMATOLOGIC AND LYMPHATIC:  No anemia, easy bruising or bleeding.  INTEGUMENTARY:  No acne, rash, lesions.  MUSCULOSKELETAL:  No pain in the neck, back, shoulder.  Denies any gout.  NEUROLOGIC:  No CVA, TIA.  Denies ataxia, vertigo, dementia.  PSYCHIATRIC:  No history of insomnia, ADD, OCD, bipolar disorder.  PHYSICAL EXAMINATION: VITAL SIGNS:  Temperature 98.6, pulse 93, respirations are 18 to 20, blood pressure is 166/85, pulse ox 100%.  GENERAL APPEARANCE:  Not under acute distress, moderately built and moderately nourished.  HEENT:  Normocephalic, atraumatic.  Pupils are equally reacting to light and accommodation.  No scleral icterus.  No conjunctival injection, but eyelids are swollen and  erythematous.  Extraocular movements are intact.  Positive ethmoidal, maxillary and sinus tenderness.  No angioedema.  Dry mucous membranes.  Positive postnasal drip.  No pharyngeal exudates.  NECK:  Supple.  No JVD.  No thyromegaly.  Range of motion is intact.  LUNGS:  Clear to auscultation bilaterally.  No accessory muscle usage.  No anterior chest wall tenderness.  CARDIAC:  S1, S2 normal.  Regular rate and rhythm.  No murmurs.  GASTROINTESTINAL:  Soft.  Bowel sounds are positive in all four quadrants.  Nontender, nondistended.  No masses felt.  No hepatosplenomegaly.  NEUROLOGIC:  Awake, alert, oriented x 3.  Cranial nerves II through XII are grossly intact.  Motor and sensory are intact.  Reflexes are 2+.  SKIN:  Warm to touch.  Normal turgor.  No erythema, lesions, rashes noticed.   EXTREMITIES:  No edema.  No cyanosis.  No clubbing.  Peripheral pulses are 2+.  PSYCHIATRIC:  Flat mood and affect.   LABORATORY DATA AND IMAGING STUDIES:  CT of the maxillofacial area has revealed no evidence of periorbital abscess.  No more than minimal periorbital edema laterally.  No intraconal or extraconal masses are demonstrated.  Fluid and soft tissue density within the left maxillary and ethmoidal sinuses is consistent with acute sinusitis.  No abnormality of the bony orbits or the facial bones is demonstrated.  There is minimal preseptal edema on the left as compared to the right.  No post septal edema.  The globes appear intact.  WBC 3.9, hemoglobin 9.8, hematocrit 28.6, platelets 160, MCV 84.  Troponin less than 0.02.  LFTs, total protein 7.1.  Serum albumin 1.8, bilirubin total 0.2, alkaline phosphatase 8.0, AST 19, ALT 21.  Blood glucose is 547, BUN 31, creatinine 1.38, sodium is 132, potassium 4.8, chloride 102, anion gap is 7, serum osmolality 296, calcium 8.1.   ASSESSMENT AND PLAN:  The patient is a 49 year old Caucasian female presenting to the ER with a chief complaint of five day history of facial  pain and edematous eyelids for the past two days, will be admitted with the following assessment and plan.  1.  Acute preseptal cellulitis and acute sinusitis.  Cultures were sent out.  Unasyn one dose IV was given in the ER.  We will provide the patient Rocephin 1 gram IV every day.  We will give her morphine and Toradol alternatively for pain.  The patient prefers taking Toradol, in  view of AKI  we will monitor renal function closely.  2.  Acute kidney injury.  We will provide her IV fluids.  3.  Insulin-dependent diabetes mellitus.  The Lantus dose was decreased from 30 units to 15 units as patient is reluctant to take Lantus on daily basis and claiming that the Lantus is giving her severe muscle cramps.  We will give her 15 units of subQ Lantus at bedtime.  4.  Chronic hepatitis C.  The patient is to follow up with [facility] hepatologist as scheduled.  5.  Chronic anemia, probably from chronic diseases like hepatitis C.  Regarding this, patient will be following at [facility].  6.  SHE IS FULL  CODE.  TOTAL TIME SPENT ON THE ADMISSION:  Is 50 minutes.      ____________________________ Nicholes Mango, MD ag:ea D: 03/31/2013 01:42:54 ET T: 03/31/2013 04:08:38 ET JOB#: [id number]  cc: Nicholes Mango, MD, <Dictator> [place] MD ELECTRONICALLY SIGNED 04/02/2013 6:08

## 2015-04-12 NOTE — Discharge Summary (Signed)
PATIENT NAME:  [name], [name] MR#:  [id number] DATE OF BIRTH:  [birth year]  DATE OF ADMISSION:  04/14/2013 DATE OF DISCHARGE:  04/19/2013  PRIMARY CARE PHYSICIAN: At [facility].   DISCHARGE DIAGNOSES:  1. Acute on chronic diastolic congestive heart failure.  2. Accelerated hypertension.  3. Chronic neuropathy.  4. Hepatitis C with chronic abdominal pain.  5. Severe hypoalbuminemia.  6. Diabetes mellitus type 2.  7. Irritable bowel syndrome.  8. Chronic anemia.   IMAGING STUDIES DONE: Include: 1. A chest x-ray which showed pulmonary edema.  2. Echocardiogram showed ejection fraction of 60 to 65% with impaired relaxation pattern with left ventricular diastolic filling. Mild to moderate mitral valve regurgitation, moderately increased left ventricular posterior wall thickness.   CONSULTS: Dr. Humphrey Rolls of Cardiology.   ADMITTING HISTORY AND PHYSICAL: Please see detailed H and P dictated on 04/14/2013. In brief, a 49 year old female patient with past history of chronic hepatitis C, chronic abdominal pain, diabetes mellitus, anemia who presented to the hospital with lower extremity edema and some chest pain. The patient was found to be in diastolic CHF and was admitted to the hospitalist service.   1. Acute on chronic diastolic CHF. The patient had significant edema which has resolved. The patient has diuresed a total of 12 liters by the time of discharge, has been decreased to Lasix 20 mg once a day, is doing well, breathing on room air without any problems breathing or chest pain. Her cardiac enzymes are normal. The patient was seen by Dr. Humphrey Rolls who thought her chest pain was secondary to CHF.  2. The patient does have chronic abdominal pain with hepatitis C. She was scheduled to get an MRI of the abdomen at [facility], but patient was admitted to the hospital here. An MRI was attempted to get here in the hospital, but as this was an outpatient procedure, the patient has been given a slip to get an MRI of  the abdomen as outpatient in [facility] where her hepatology doctors are present.  3. Neuropathy. The patient has had chronic neuropathy which is unchanged.   The patient was seen by physical therapy who initially recommended rehab. But later on, the patient improved well, has ambulated about 400 feet with a walker independently and is being discharged home with home health for physical therapy.   Today, patient does have some mild abdominal tenderness on palpation which is chronic, no nausea, vomiting, or diarrhea and is being discharged back home to follow up with her hepatology doctors at [facility].   DISCHARGE MEDICATIONS: Include: 1. Insulin regular 5 to 10 units twice a day a sliding scale.  2. Lantus 30 units subcutaneous once a day at bedtime.  3. Oxycodone 5 mg oral every 4 hours as needed for pain.  4. Lidocaine 5% topical film, topically to affected area once a day.  5. Baclofen 10 mg oral 2 times a day.  6. Sodium chloride nasal spray every hour as needed for dryness.  7. Fluticasone two sprays once a day.  8. Promethazine 25 mg oral every 6 hours as needed for nausea and vomiting.  9. Isosorbide mononitrate 30 mg oral once a day.  10. Lisinopril 20 mg oral once a day.  11. Omeprazole 20 mg oral once a day.  12. Lasix 20 mg oral once a day.  13. Metoprolol succinate 25 mg oral once a day.   DISCHARGE INSTRUCTIONS: The patient will be on a carbohydrate-controlled diet, activity as tolerated with physical therapy which has been  set up through [facility]. Follow up with [facility] and primary care physician in 1 to 2 weeks. Follow up at [facility] for her MRI of the abdomen for which a slip has been given.   TIME SPENT: On day of discharge in discharge activity was 53 minutes.    ____________________________ Leia Alf Anella Nakata, MD srs:es D: 04/19/2013 14:00:48 ET T: 04/19/2013 14:25:58 ET JOB#: [id number]  cc: Alveta Heimlich R. Yina Riviere, MD, <Dictator> [place] MD ELECTRONICALLY SIGNED  05/03/2013 13:54

## 2015-04-12 NOTE — Discharge Summary (Signed)
PATIENT NAMEANDRALYN, [name] MR#:  [id number] DATE OF BIRTH:  February 28, 1966  DATE OF ADMISSION:  03/31/2013 DATE OF DISCHARGE:  04/03/2013  REASON FOR ADMISSION: Facial pain and swollen eyes on the right side, likely cellulitis.   DISCHARGE DIAGNOSES: 1.  Facial cellulitis.  2.  Sinusitis.  3.  Dental pain.  4.  Weakness and numbness of the lower extremities from thoracic spine down. 5.  Hepatitis C.  6.  Uncontrolled insulin-dependent diabetes.  7.  Constipation.  8.  Acute kidney injury.  9.  Chronic disease anemia.   MEDICATIONS AT DISCHARGE:  Insulin 5 to 10 units twice daily depending on blood sugars, Lantus 30 units once a day at bedtime, oxycodone 5 mg every 4 hours as needed for pain, lidocaine 5% topical film every day, baclofen 10 mg 3 times a day p.r.n. back pain, sodium chloride nasal spray, fluticasone nasal spray and amoxicillin with clavulanic one twice daily for 10 days, promethazine 25 mg every 6 hours as needed for vomiting.   FOLLOW UP:  Follow up with primary care physician at [facility] in 1 to 2 weeks and with the hepatologist at [facility]. The patient was given a referral for the psychiatry clinic as the patient needs psychiatric services as well.   HOSPITAL COURSE: The 49 year old female with history of hepatitis C anemia, diabetes, complaining of severe pain for 5 days prior to admission, admitted on 03/31/2013 with a swollen face and some erythema around the eyes. She denied any blurry vision or double vision. CT scan of the face was done revealing acute sinusitis with no evidence of periorbital abscess.  the sinusitis was located in her maxillary and ethmoidal sinuses.  There was no postseptal edema. The patient was given Unasyn and admitted for evaluation.  The patient was also given Toradol for pain. The patient had significant and very symptomatic problems. The patient was complaining of pain all along that was relieved with Dilaudid. The patient has some significant  emotional issues. Psychiatry consultation was not obtained, but she was sent to see outpatient psychiatry. The patient states that mostly her depression and anxiety comes from her medical condition. The patient also has not been seen by a psychiatrist. We were not prescribing any psychiatric medications due to the fact that the patient is very noncompliant and I was afraid that she would not follow up with anyone if we prescribed medications here or if we do a big evaluation and start new medications.  If the patient does not follow up, she is at risk of having complications from that.   The patient was treated with antibiotics for this acute sinusitis.  She developed acute kidney injury during this hospitalization.  The patient was taking Toradol. She was not eating, got dehydrated. Her creatinine went up to 1.61; initially was 1.38.  At discharge creatinine is 1.8. Her blood sugars were around 130s to 200s, her electrolytes were overall within normal limits except for slightly decreased sodium is 7.9. Her hemoglobin A1c was 13. Her LFTs were overall normal with low albumin of 1.8, AST 19, alkaline phosphatase 80. White count 3.9, hemoglobin 9.8, at discharge it was 8.1 due to dilution.  Her urinalysis showed mixed bacteria. Urinalysis has 22 leukocytes.   EKG: Normal sinus.  MRI of the thoracic spine was done showing only mild chronic appearing anterior height los of the T11 and T12 vertebral bodies. There are small trace pleural effusions. The patient was started on baclofen and she had significant relief of  the back pain and she was able to move her legs much more.  I think most of her issues have to do with psychiatry.  Again, we did not prescribe any medications during this hospitalization, with the fear of having non-compliance of the patient.  The patient is referred to outpatient psychiatrist.   CONDITION ON DISCHARGE: The patient was discharged in good condition.   TIME SPENT: I spent about 45  minutes with this patient.    ____________________________ [place] Sink, MD rsg:ct D: 04/04/2013 07:28:18 ET T: 04/04/2013 08:07:37 ET JOB#: [id number]  cc:  Sink, MD, <Dictator> [place] MD ELECTRONICALLY SIGNED 04/07/2013 13:33

## 2015-04-12 NOTE — Consult Note (Signed)
PATIENT NAME:  [name], WUNDERLICH MR#:  [id number] DATE OF BIRTH:  08-Feb-1966  DATE OF CONSULTATION:  04/14/2013  CONSULTING PHYSICIAN:  Dionisio David, MD  INDICATION FOR THE CONSULTATION: Congestive heart failure.   HISTORY OF PRESENT ILLNESS: This is a 49 year old white female with a past medical history of chronic hepatitis C, anemia, diabetes mellitus, insulin requiring, and hypertension who came into the Emergency Room with swelling of the legs and shortness of breath. She describes it as that her whole body was swollen. She states she was just here until the 15th of this month and was discharged after being treated for CHF, but as soon as she went home, she started getting swelling of the legs and puffiness of her face and shortness of breath and some tightness in the chest. Thus, she came back to the Emergency Room.   PAST MEDICAL HISTORY: She has history of chronic hepatitis C, is followed by [facility], insulin-dependent diabetes mellitus, chronic anemia, history of cholecystectomy, C-section.   MEDICATIONS:  1. Insulin Lantus 30 units subcutaneous at bedtime.  2. Oxycodone.  3. Regular insulin 5 to 10 units 2 times before meal and with a sliding scale.   SOCIAL HISTORY: She quit smoking a month ago. Denies EtOH abuse.   FAMILY HISTORY: She says that both of her parents had coronary artery disease.   PHYSICAL EXAMINATION:  VITAL SIGNS: Her temperature is 98.3, pulse is 88, respirations 18, blood pressure 146/78 and saturation was 98.  NECK: Revealed positive JVD.  LUNGS: There are a few crepitations at the bases.  HEART: Regular rate and rhythm. Normal S1, S2. No audible murmur.  ABDOMEN: Soft, nontender, positive bowel sounds.  EXTREMITIES: 1+ pedal edema.  NEUROLOGIC: She appears to be intact.   DIAGNOSTIC STUDIES: EKG shows normal sinus rhythm, 96 beats per minute, minimal criteria for LVH, otherwise unremarkable. No ST elevation or ST depressions. Her  initial and the second troponin were negative, less than 0.02. Her glucose was elevated at 227, BUN was 34, creatinine was 1.44. BNP was 2289. She had also a chest x-ray done which showed mild infiltrate in the right lung field.   ASSESSMENT AND PLAN: The patient has congestive heart failure, most likely systolic dysfunction, with history of diabetes and hypertension. She is getting an echocardiogram; will look at that. In the meantime, agree with starting Lasix. Once LV function is known, will consider putting the patient on ACE inhibitor and carvedilol. Also consider getting renal consult because of the renal insufficiency. She has a long-standing history of diabetes, and further evaluation should be done while she is in the hospital.   Thank you very much for the referral.   ____________________________ Dionisio David, MD sak:OSi D: 04/14/2013 08:40:31 ET T: 04/14/2013 09:01:58 ET JOB#: [id number]  cc: Dionisio David, MD, <Dictator> Dionisio David MD ELECTRONICALLY SIGNED 04/21/2013 8:58

## 2015-04-13 NOTE — Consult Note (Signed)
PATIENT NAME:  [name], [name] MR#:  [id number] DATE OF BIRTH:  August 06, 1966  DATE OF CONSULTATION:  10/18/2014  REFERRING PHYSICIAN:  Munsoor Darin Engels., MD CONSULTING PHYSICIAN:  Meda Klinefelter., MD  REASON FOR CONSULTATION:  Positive ANA.   HISTORY OF PRESENT ILLNESS:  This is a 49 year old white female with complicated story. She said she has had greater than 20 years of diabetes. She has had retinopathy, neuropathy, gastroparesis, and renal disease. She also has hepatitis C with hepatosplenomegaly and has not had treatment. She is not thought to have significant cirrhosis.    For the last 2 years, she has had weakness in her legs. She has to use a walker, episodically drives, and sometimes uses a cane, may have to pick up her legs with her hands. Feet are numb. She has been diagnosed with neuropathy and has been on gabapentin and other agents. She has not seen a neurologist. She has not had EMGs.   She has longstanding psoriasis. When she was living in [place], she received Remicade, but she thought it was for rheumatoid arthritis. She was seen by rheumatology in [facility] and thought not to have rheumatoid arthritis.   She had recent cough. She has known gastroparesis. She has been on Reglan. She had hospitalization with worsening cough requiring steroids and antibiotics. Creatinine is worse, going up to 4, now at 3.6. Urinalysis showed significant proteinuria, but no significant cells. She had a hemoglobin down to 8.9, elevated PTH, and low vitamin D.   Part of the evaluation of her renal insufficiency was drawing a FANA, which had DNA of 10 but RNP of greater than 8. She has not had Raynaud's. She has no interstitial lung disease. Complement is normal. She has not had photosensitivity. There has been no significant alopecia or oral ulcers. No serositis.   PAST MEDICAL HISTORY:  Hepatitis C, diabetes, retinopathy, neuropathy, renal insufficiency, chronic anemia, depression,  cirrhosis, hypertension, congestive heart failure.   SOCIAL HISTORY:  No cigarettes or alcohol.   FAMILY HISTORY:  Negative for any connective tissue diseases.   REVIEW OF SYSTEMS:  No recent photosensitivity rash, abdominal pain, some nausea. She does complain of seeing things and hearing things. She said she has had this before, which she related to medicines.   PAST SURGICAL HISTORY:  Status post cholecystectomy, tonsillectomy, C-section.  PHYSICAL EXAMINATION:  GENERAL:  Ill-appearing, pale female who is sitting on the side of the bed. Raspy cough.  VITAL SIGNS:  Temperature 97, blood pressure 166/92, pulse 77, oxygen saturation 95% on room air.  HEENT:  Sclerae are pale. Oropharynx without oral lesions.  NECK:  Mildly prominent thyroid. Good carotid upstroke.  CHEST:  Rhonchi in both bases and upper lobes.  CARDIOVASCULAR:  No significant murmur.  EXTREMITIES:  Trace lower extremity edema.  MUSCULOSKELETAL:  Good range of his neck and shoulders. Hands with mild thickening across the right hand MCPs. PIPs are mildly tender. No sclerodactyly. No discoid lesions. No telangiectasias. Mild decreased range of motion of both hips. Knees without effusions. Ankles and toes without synovitis.  NEUROLOGIC:  She cannot raise her hips or her legs against any power or against gravity. She has 4/5 plantar and dorsiflexors bilaterally. No reflexes in the lower extremities. She has -5/5 triceps and biceps. She has 4/5 grip. She has -5/5 neck flexors. Decreased pin in the lower extremities.   IMPRESSION: 1.  Positive ANA and RNP without obvious connective tissue disease. She does not have Raynaud's or interstitial  lung disease that is usually associated with mixed connective tissue disease. Renal failure and proteinuria are uncommon with positive RNP antibody. Urine is not nephritic but rather nephrotic.  2.  Renal insufficiency with worsening, cannot rule out membranous nephropathy. It may all be related  to diabetes.  3.  Longstanding diabetes and multiple complications.  4.  Profound lower extremity weakness, most likely diabetic myopathy and neuropathy but cannot rule out other etiologies.  5.  Anemia.  6.  Hyperparathyroid.  7.  Cirrhosis.  8.  Hepatitis C with splenomegaly.  9.  Hallucinations, question medications in the setting of worsening renal function.   RECOMMENDATIONS:   1.  Would leave it to nephrology as to whether they think further renal evaluation to look for underlying causes is necessary. Would be a poor candidate for any immunosuppressive agent.  2.  Recommend neurology opinion regarding her weakness. She is at high risk for falling. Should have EMGs and neurology evaluation.  3.  Hepatitis C has been evaluated and followup is planned.  4.  Could have serologies checked at a later time. May be multifactorial, but no evidence of needing another agent at this point.    ____________________________ Meda Klinefelter., MD gwk:nb D: 10/18/2014 18:40:39 ET T: 10/18/2014 22:19:34 ET JOB#: [id number]  cc: Meda Klinefelter., MD, <Dictator> Ovidio Hanger MD ELECTRONICALLY SIGNED 10/19/2014 9:19

## 2015-04-13 NOTE — H&P (Signed)
PATIENT NAME:  [name], [name] MR#:  [id number] DATE OF BIRTH:  March 06, 1966  DATE OF ADMISSION:  10/15/2014  REFERRING PHYSICIAN:  Lurline Hare, MD  PRIMARY CARE PHYSICIAN:  Josephine Cables, MD at [place]:  Juluis Mire, MD   CHIEF COMPLAINT:  Cough with sputum and shortness of breath with wheezing, ongoing since September.   HISTORY OF PRESENT ILLNESS:  [name] is a 49 year old female with past medical history significant for hypertension, diabetes mellitus on insulin, history of congestive heart failure secondary due to diastolic dysfunction in 123456, CKD stage III, chronic anemia, hepatitis C, depression, psoriasis, history of rheumatoid arthritis with last Remicade in 2007, history of neuropathy and splenomegaly, who presents to the Emergency Room with the complaints of ongoing cough with sputum associated with shortness of breath and wheezing since September. The patient stated that she has been having some cough with sputum and wheezing with shortness of breath ongoing since September, for which she has seen her primary care doctor at the [facility] clinic and was told that she has allergies with reactive airway disease and was treated with various anti-allergy medications, but without any relief. The patient continued to have the symptoms. Hence, she went to see her primary care doctor last week and she was evaluated and was told she has some bronchitis with some reactive airway disease. Hence, she was put on p.o. Levaquin, p.o. steroids, and nebulizers, which the patient has been using for the past 4 to 5 days but symptoms continue to worsen; hence, she came to the Emergency Room for further evaluation. Denies any fever. No chills. Diffuse chest pain secondary due to coughing present, otherwise no left-sided chest pain. She does have some nausea, but no vomiting. No diarrhea. No abdominal pain. No dysuria or hematuria.   In the Emergency Room, the patient was evaluated by  the ED physician and was found to have significant wheezing with bilateral rhonchi and was given IV Solu-Medrol and nebulizers and hospitalist service was consulted for further evaluation and management. The patient does not carry a diagnosis of COPD, but she was told during her last visit last week that she might have some COPD secondary due to her chronic active smoking.   PAST MEDICAL HISTORY:   1.  Hypertension, history of congestive heart failure secondary due to diastolic dysfunction in 123456, ejection fraction at the time of 60 to 65%.  2.  Diabetes mellitus on insulin.  3.  CKD stage III.  4.  Chronic anemia.  5.  Hepatitis C being followed by [facility] hepatology, planning to start hepatitis C treatment in November of this year.  6.  History of depression.  7.  History of psoriasis.  8.  Chronic neuropathy.  9.  Splenomegaly of unclear etiology.  10.  History of rheumatoid arthritis, for which she received Remicade. The last dose was in 2007, currently being managed by p.r.n. Advil.   ALLERGIES:  1.  ACETAMINOPHEN.  2.  CINNAMON, GARLIC, ONIONS.    HOME MEDICATIONS:   1.  Baclofen 10 mg tablet half tablet orally 3 times a day.  2.  Insulin regular 5 to 10 units 2 times a day with meals as needed.  3.  Lantus 20 units at bedtime subcutaneously.  4.  Furosemide 20 mg 1 tablet orally daily.  5.  Metoprolol succinate 25 mg 1 tablet orally once a day.  6.  Omeprazole 20 mg 1 tablet orally daily.  7.  Oxycodone 5 mg 1  tablet orally every 4 hours as needed for pain.  8.  Promethazine 25 mg tablet 1 tablet orally q. 6 hours.  9.  Cardizem CD 120 mg p.o. q. 12 hours.   PAST SURGICAL HISTORY:  1.  Status post cholecystectomy.  2.  Status post tonsillectomy.  3.  Status post C-section.   SOCIAL HISTORY:  She is single and unemployed. History of smoking, chronic for the past many years, presently she states she smokes about 6 cigarettes per day. Denies any alcohol or substance abuse.    FAMILY HISTORY:  Significant for heart disease with her mother, father, and grandparents on both sides.    REVIEW OF SYSTEMS: CONSTITUTIONAL:  Denies any fever or chills, but she does have some fatigue and general weakness. No excessive weight gain or weight loss lately.  EYES:  Negative for blurred vision or double vision. No pain. No redness. No inflammation.  EARS, NOSE, AND THROAT:  Negative for tinnitus, ear pain, hearing loss, epistaxis, nasal discharge, or difficulty swallowing.  RESPIRATORY:  Ongoing cough with expectoration of light yellow sputum associated with some diffuse wheezing since September of this year, which worsened in the past one week. She has seen her primary care doctor last Thursday and was told she has some bronchitis with some reactive airway disease and was prescribed oral antibiotics, oral prednisone, and inhalers. Diffuse chest pain secondary due to chronic coughing present.  CARDIOVASCULAR:  No orthopnea. No pedal edema. No palpitations. No syncope or loss of consciousness.  GASTROINTESTINAL:  She does have chronic nausea, but no vomiting or diarrhea. No abdominal pain. Negative for hematemesis or melena. She does have a history of chronic hepatitis C, for which she is being followed at [facility] hematology clinic.   GENITOURINARY:  Denies any dysuria, hematuria, frequency, or urgency.  ENDOCRINE:  Denies any polyuria or polydipsia. No heat or cold intolerance.  HEMATOLOGIC AND LYMPHATIC:  History of chronic anemia present. No easy bruising. No bleeding.  INTEGUMENTARY:  Chronic psoriatic rash over both elbows and scalp present, for which she uses topical steroid creams.  MUSCULOSKELETAL:  Chronic back pain present. She uses pain medications. She does have a history of rheumatoid arthritis.  NEUROLOGICAL:  Negative for any focal weakness or numbness. No history of CVA, TIA, or seizure disorder.  PSYCHIATRIC:  History of chronic depression, stable on medications.    PHYSICAL EXAMINATION: VITAL SIGNS:  Temperature 98 degrees Fahrenheit, pulse rate on arrival in the Emergency Room was 123 per minute and currently is 109 per minute, respirations on arrival 26 per minute and now 20 per minute, blood pressure on arrival 218/101 and currently 193/101, pulse oximetry 96% on room air. GENERAL:  Well-developed, well-nourished, young lady. Alert, awake, oriented x 3, and in no acute respiratory distress at this time. Pleasant and cooperative.  HEAD:  Atraumatic, normocephalic.  EYES:  Pupils are equal and reactive to light and accommodation. No conjunctival pallor. No scleral icterus. Extraocular movements are intact.  NOSE:  No nasal lesions. No drainage.  EARS:  No drainage. No external lesions.  ORAL CAVITY:  No mucosal lesions. No exudates. No masses.  NECK:  Supple. No JVD. No thyromegaly. No carotid bruit. Range of motion of neck is normal.  RESPIRATORY:  Bilateral diffuse rhonchi present. Transmitted sounds present, not using accessory muscles of respiration.  CARDIOVASCULAR:  S1, S2 regular. Tachycardia is present. No murmurs appreciated. Peripheral pulses are equal at carotid, femoral, and pedal pulses. No peripheral edema.  GASTROINTESTINAL:  Abdomen is soft,  obese, and nontender. No organomegaly appreciated.  Bowel sounds are present and equal in all 4 quadrants. No tenderness. No rigidity. No guarding.  GENITOURINARY:  Deferred.  MUSCULOSKELETAL:  Range of motion is adequate in all areas. Tone and strength are equal bilaterally.  SKIN:  Psoriatic lesions present on bilateral elbows posteriorly and on the scalp present.  LYMPH NODES:  Negative for cervical lymphadenopathy.  VASCULAR:  Good dorsalis pedis and posterior tibial pulses.  NEUROLOGICAL:  Alert, awake, and oriented x 3. Cranial nerves II through XII are grossly intact. Deep tendon reflexes are 2+ and symmetrical bilaterally. Motor strength is 5/5 in both upper and lower extremities.   PSYCHIATRIC:  Judgment and insight are adequate. Alert and oriented x 3. Memory and mood are within normal limits.   LABORATORY DATA:  Serum glucose 240, BUN 82, creatinine 4.22, sodium 137, potassium 5.0, chloride 108, bicarbonate 18. Troponin is less than 0.02. CBC: WBC of 10.9, hemoglobin 9.7, hematocrit 29.8, platelet count 207, MCV 86.   IMAGING:  Chest x-ray: Normal heart size and mediastinal contours. No acute infiltrates or edema. No active disease.   EKG:  Sinus tachycardia with a ventricular rate of 118 beats per minute. No acute ST-T changes.  ASSESSMENT AND PLAN:  A 49 year old female with past medical history of multiple medical problems, who presents with ongoing cough with expectoration of light yellow sputum with wheezing ongoing since September of this year with further worsening in the last 3 to 5 days.   1.  Cough with sputum and shortness of breath with wheezing since September 2015 with worsening symptoms for the past 3 to 5 days. Acute bronchitis with likely chronic obstructive pulmonary disease exacerbation (no prior documented chronic obstructive pulmonary disease). Rule out community-acquired pneumonia. Plan:  Blood and sputum cultures, IV antibiotics of Rocephin and azithromycin, IV steroids of Solu-Medrol, vigorous DuoNebs, consider PFTs and pulmonary consult as needed.  2.  Acute renal failure on chronic kidney disease stage III, likely secondary due to dehydration on top of diabetic kidney disease. Plan:  Gentle IV hydration. Hold Lasix for now. Will get abdominal sonogram and then follow BMP. Avoid nephrotoxic agents.   3.  History of hypertension with accelerated hypertension. Hydralazine IV p.r.n. for blood pressure control. Continue home medications. Monitor BP closely.  4.  Diabetes mellitus on insulin. Continue Lantus 10 units at bedtime and sliding scale insulin, check A1C and fasting lipids.   5.  History of congestive heart failure with diastolic dysfunction in  123456, echocardiogram done at that time with ejection fraction of 60 to 65%, currently stable. Monitor.  6.  Chronic anemia, hemoglobin 9.4. Monitor hemoglobin and hematocrit closely.  7.  History of hepatitis C, under care of [facility] hepatology clinic, planning for initiation of hepatitis C treatment in November 2015. Check LFTs and protime and check her liver sonogram and monitor.   8.  History of neuropathy on chronic pain medications, controlled. Continue same.   9.  History of rheumatoid arthritis and used Remicade in the past. Last use of Remicade was in 2007. Since then, using prn otc nsaids since she did not have any health insurance. Plan:  Advised not to take any nonsteroidal anti-inflammatory drugs in view of acute-on-chronic kidney disease.  10.  History of psoriasis on topical steroid creams, stable. Continue same.  11.  History of splenomegaly, etiology not known. Most likely secondary to hepatitis C. Check abdominal sonogram and follow up accordingly.   12.  Active smoker. Counseled to quit and  offered nicotine replacement therapy. The patient is not decided.  13.  Deep vein thrombosis prophylaxis with subcutaneous heparin.  14.  Gastrointestinal prophylaxis with Protonix.   CODE STATUS:  Full code.   TIME SPENT:  55 minutes.    ____________________________ Juluis Mire, MD enr:nb D: 10/15/2014 01:46:16 ET T: 10/15/2014 02:25:54 ET JOB#: [id number]  cc: Juluis Mire, MD, <Dictator> Josephine Cables, MD  Juluis Mire MD ELECTRONICALLY SIGNED 11/16/2014 19:28

## 2015-04-13 NOTE — Consult Note (Signed)
Brief Consult Note: Patient was seen by consultant.   Consult note dictated.   Comments: 1 Pos Fana,RNP without obvious mixedconnective tissue disease(no Raynauds or interstitial lung disease)2. renal failure/nephrosis, most likely diabetic, less likely nephritis 3 profound LE weakness, chronic. most likely diabetic neuropathy/myopathy but cannot r/o other causes 4. longstanding diabetes with multiple manifestations 5 Hep C ,spleenomegaly 6 hallucinations,auditory and visual,?meds ?metabolic 7 cough,upper airway sounds . ?chronic aspiration from gastroparesis 8 psoriasis 9 s/p remicade  Rec: will need neuro eval of her LE weakness. renal does not suspect nephritis so no renal bx.  Electronic Signatures: Leeanne Mannan., Eugene Gavia (MD)  (Signed 29-Oct-15 18:29)  Authored: Brief Consult Note   Last Updated: 29-Oct-15 18:29 by Leeanne Mannan., Eugene Gavia (MD)

## 2015-04-13 NOTE — Consult Note (Signed)
PATIENT NAME:  [name], [name] MR#:  [id number] DATE OF BIRTH:  November 23, 1966  DATE OF CONSULTATION:  10/20/2014  REFERRING PHYSICIAN:   Munsoor Lilian Kapur, MD, from nephrology CONSULTING PHYSICIAN:  Algernon Huxley, MD  REASON FOR CONSULTATION: Renal failure with need for dialysis catheter.   HISTORY OF PRESENT ILLNESS: This is a 49 year old female with a past medical history significant for congestive heart failure, diabetes, hypertension, and stage 3 chronic kidney disease. She is admitted with worsening shortness of breath, cough, and wheezing over several weeks' time. This has been going on to some degree back into September. She was found to be in acute renal failure. Despite appropriate medical management over the past several days, her BUN and creatinine continue to climb and her BUN is now 114. She is developing some uremia. She is very weak and lethargic and provides a very poor history today. By appearance of the medical record, this is a change and has worsened since her admission. She can still answer some questions correctly but is unable to do much movement on her own and requires 2 nurses to actually get her from the chair to the bed. By report, earlier in the week, she was actually walking with assistance. For these reasons, she seems to be developing worsening uremia and we will need to start dialysis and we are asked to place a dialysis catheter. She has had chronic kidney disease for many years. She has had no previous dialysis treatments. She has been treated for acute bronchitis with steroids in hopes of improving her shortness of breath, but this continues to worsen.  PAST MEDICAL HISTORY: Significant for: 1. Hypertension.  2. Congestive heart failure.  3. Diabetes on insulin.  4. Stage III chronic kidney disease.  5. Hepatitis C.  6. Depression.  7. Psoriasis.  8. Chronic anemia.  9. Neuropathy.  10. Rheumatoid arthritis.  11. Splenomegaly.   ALLERGIES: ACETAMINOPHEN, GARLIC,  ONIONS, AND CINNAMON.  HOME MEDICATIONS: Include: 1. Baclofen 10 mg half tablets t.i.d.  2. Insulin 5 to 10 units 2 times daily with meals as needed.  3. Lantus 20 units at bedtime subcutaneously.  4. Lasix 20 mg daily.  5. Metoprolol 25 mg daily.  6. Omeprazole 20 mg daily.  7. Oxycodone as needed for pain.  8. Cardizem CD 120 mg b.i.d.  9. Promethazine 25 mg every 6 hours as needed.   PAST SURGICAL HISTORY: Includes: 1. Cholecystectomy.  2. Tonsillectomy.  3. C-section.   SOCIAL HISTORY: She is single and unemployed. She smokes and has for many years, less than 1 pack per day. Denies alcohol or substance abuse.   FAMILY HISTORY: Significant for heart disease in the mother and the father.   REVIEW OF SYSTEMS: Somewhat difficult to obtain as she is quite lethargic. GENERAL: She has fatigue and lethargy. No outpatient weight loss or gain.  EYES: No blurred or double vision.  EARS: No tinnitus or ear pain.  CARDIAC: Positive for shortness of breath with minimal activity. No palpitations.  RESPIRATORY: Cough, sputum and wheeze as per HPI. GASTROINTESTINAL: Chronic nausea, and chronic hepatitis C.  GENITOURINARY: Positive for acute renal failure.  ENDOCRINE: No heat or cold intolerance. PSYCHIATRIC: History of depression, stable on medicine.  NEUROLOGIC: No history of TIA, stroke or seizure.  MUSCULOSKELETAL: She has chronic back pain.  SKIN: Chronic psoriasis. IMMUNE: Chronic rheumatoid arthritis, stable.  HEMATOLOGIC: Chronic anemia.   PHYSICAL EXAMINATION:  GENERAL: This is an obese white female, somewhat lethargic and  has a difficult  time cooperating with movements and exam. VITAL SIGNS: Temperature is 97.5 and a pulse of 69, blood pressure is 143/85, saturations are 98% on room air. She had been on room air. HEENT: Normocephalic and atraumatic. Sclera are non-icteric. Conjunctivae are clear. Ears: Normal external appearance. Hearing appears to be intact.  NECK: Supple  without adenopathy or JVD.  HEART: Regular rate and rhythm without murmurs, rubs, or gallops.  LUNGS: She has diminished breath sounds with crackles in the bases.  ABDOMEN: Soft, nondistended, nontender.  EXTREMITIES: Show mild to moderate lower extremity edema. Good capillary refill.  NEUROLOGIC: Follows commands. Generalized weakness is present. PSYCHIATRIC: Somewhat lethargic and slow to respond.  SKIN: Shows psoriasis lesions on her elbows and scalp. VASCULAR: She does have palpable pedal pulses bilaterally.   LABORATORY EVALUATIONS: Show a sodium of 140, potassium 4, chloride 103, CO2 is 25, BUN is 114, creatinine is 3.8 which is a clearance of 13, glucose is 145. Her calcium is 7.7. She had not had a CBC in several days.   ASSESSMENT AND PLAN: This is a 49 year old female with chronic kidney disease stage III and acute renal failure. She needs dialysis now for progressive urinary symptoms and volume overload and shortness of breath. A temporary dialysis catheter was placed at bedside without difficulty. If she does not improve her renal function and requires permanent dialysis access, we will be happy to place a PermCath next week and she should probably consider outpatient vein mapping for fistula creation as well.  This is a level 4 consultation.    ____________________________ Algernon Huxley, MD jsd:jh D: 10/21/2014 09:16:00 ET T: 10/21/2014 14:36:46 ET JOB#: [id number]  cc: Algernon Huxley, MD, <Dictator> Algernon Huxley MD ELECTRONICALLY SIGNED 10/24/2014 8:57

## 2015-04-13 NOTE — Consult Note (Signed)
PATIENT NAME:  [name], [name] MR#:  [id number] DATE OF BIRTH:  06/22/1966  DATE OF CONSULTATION:  10/21/2014  REFERRING PHYSICIAN:   CONSULTING PHYSICIAN:  Joncarlo Friberg K. Torre Schaumburg, MD  AGE: 49.  SEX: Female.  RACE: White.  SUBJECTIVE: The patient was seen in consultation in room #210 [facility]. The patient is a 49 year old white female, not employed and last worked 4 years ago and she is trying to get on disability for "neuropathy, diabetic and congestive heart failure." The patient is separated since 2001 and moved to [place] from [place] to live with her boyfriend who is 31 years old and is a Set designer. Two sons that are 51 years and 34 years old having been living with them and recently the 73 year old was asked to move out by her boyfriend because he was found having sex in the house. The patient was asked to be seen in consultation because of "psychosis and hallucinating."  CHIEF COMPLAINT: "Feeling depressed and feeling bothered."  HISTORY OF PRESENT ILLNESS: The patient reports that she has been feeling depressed about her current situation, also being in chronic pain secondary to neuropathy related to diabetes. Nursing staff reports that she has been yelling and shouting and hallucinating and was given Xanax last night to calm down because she is having sleep problems.  PAST PSYCHIATRIC HISTORY: History of suicide attempt on 2 occasions when she tried to overdose on pills and was taken to [facility] [place] [facility] where they did gastric lavage and gave her a followup appointment with a local psychiatrist. The patient went a few times and did not keep her followup appointment with the psychiatrist. The patient reports that she was tried on Zoloft and other serotonergic medications and Cymbalta which has not helped her with her depression.  ALCOHOL AND DRUGS: Denied drinking alcohol. Denies street or prescription drug abuse.  MENTAL STATUS: The patient is alert and oriented to place,  person and time. She knew it was 10/21/2014, she was at [facility]. She knew capital of [place], [place] and, name of the current president. Cognition intact. General knowledge of information is fair. Affect is flat. Mood restricted. Admits feeling depressed. Admits feeling low and down, feeling hopeless and helpless, worthless and useless about her physical problems and also her 43 year old son moving out. Denies suicidal or homicidal appearance and wants to get help and contract for safety. The patient reports that she is chronic pain secondary to neuropathy which causes her to stay depressed. Recently, she has been having problems with auditory hallucinations and she has been hearing things like drips going on. She can hear drops of water falling, like dripping. In addition, she has been seeing cats and dogs around. Contracts for safety and wants to get better. Insight and judgment guarded. Impulse control is fair.  IMPRESSION: Major depressive disorder, recurrent, with psychosis. The patient reports that whenever she takes baclofen caused her nerve damage which causes her to have hallucinations.  Probably, the patient is having substance-induced psychosis which is secondary to baclofen, that is baclofen-induced psychosis.  RECOMMENDATION: Consider stopping baclofen and watch if the hallucinations will improve.  MEDICATIONS TO BE RECOMMENDED: Please try to discontinue Xanax because of tolerance and addiction forming properties. Consider starting the patient on Klonopin 0.5 mg p.o. twice a day and 1 mg p.o. at bedtime which will help her with her pain and also with helping her rest at night. Effexor XR 75 mg p.o. daily which can be slowly increased to 150  mg p.o. daily which will help her with her mood and also with her pain that is chronic and anxiety. Consider putting the patient on Neurontin for her neuropathy and that will also help her with her mood and feeling better. Start Neurontin 300 mg 3  times a day and slowly increase it, even up to 600 mg 3 times a day.  FOLLOWUP: With her local mental health clinic and psychiatry at the time of discharge.   ____________________________ Wallace Cullens. Franchot Mimes, MD skc:TT D: 10/21/2014 [id number] ET T: 10/21/2014 16:39:51 ET JOB#: [id number]  cc: Arlyn Leak K. Franchot Mimes, MD, <Dictator> Dewain Penning MD ELECTRONICALLY SIGNED 10/22/2014 8:23

## 2015-04-13 NOTE — Discharge Summary (Signed)
PATIENT NAMEVIRDELL, Wilson MR#:  [id number] DATE OF BIRTH:  1966-07-26  DATE OF ADMISSION:  10/15/2014 DATE OF DISCHARGE:  10/31/2014  ADMITTING DIAGNOSES: 1.  Acute bronchitis with chronic obstructive pulmonary disease exacerbation.  2.  Acute renal failure on chronic kidney disease stage III, likely from dehydration.  3.  History of hypertension with accelerated hypertension.  4.  Diabetes mellitus, on insulin.  5.  Congestive heart failure with diastolic dysfunction, ejection fraction 60 to 65%.  6.  Chronic anemia.  7.  Chronic hepatitis C. 8.  History of rheumatoid arthritis.  9.  History of psoriasis.   DISCHARGE DIAGNOSES: 1.  Acute kidney injury on chronic renal insufficiency stage IV, nonoliguric, secondary to acute tubular necrosis.  2.  Acute bronchitis and chronic obstructive pulmonary disease exacerbation, resolved, completed antibiotic course on 10/23/2014.   SECONDARY DISCHARGE DIAGNOSES:  1.  Essential hypertension, well controlled. 2.  Chronic diastolic congestive heart failure.  3.  Gastroparesis.  4.  Diabetes mellitus with nephropathy.  5.  Chronic lower extremity weakness.  6.  Hallucinations.  7.  Chronic diarrhea.   CONSULTATIONS:  Nephrology, vascular surgery, psychiatry, and rheumatology, Dr. Jefm Bryant.   PROCEDURES:  Permacath placement was done on 10/24/2014, status post removal of permacath on 10/29/2014.   BRIEF HISTORY AND PHYSICAL AND HOSPITAL COURSE:  The patient is a 49 year old female who came in to the ED with a chief complaint of cough with sputum and shortness of breath with wheezing that started since September. Please review history and physical for details. Also, please review the interim discharge summary dictated by Dr. Vianne Bulls on 10/24/2014.   1.  Acute-on-chronic renal failure. The patient's baseline creatinine is 1.7. She has chronic kidney disease stage III. At the time of admission, creatinine was at 4.22. Her creatinine is not getting  better. She had a temporary dialysis catheter in the right groin, and hemodialysis was started. After 3 sessions of dialysis, permacath was placed on 10/24/2014. The procedure was done by vascular surgeon, Dr. Lucky Cowboy. The patient was followed up by nephrology. Creatinine was closely monitored. A 24-hour urine creatinine and protein was checked. The patient's renal function slowly improved, and dialysis was discontinued. Subsequently, permacath was removed on 10/29/2014. Nephrology recommended the patient to follow up with [facility] as an outpatient.  2.  The patient was experiencing hallucinations, regarding which psychiatry was consulted. It was thought to be from steroids, and they were tapered. Baclofen was discontinued, and the patient was started on Klonopin for anxiety and Effexor for depression. The patient was taking Neurontin, which made her edematous, and she had tried Lyrica with no significant improvement, so Neurontin and Lyrica were discontinued, though they were recommended by the psychiatrist.  3.  The patient had positive ANA levels, regarding which the patient was seen by Dr. Jefm Bryant. He said it was assumed to be from underlying kidney problem and hepatitis C. According to Dr. Scharlene Gloss recommendation, the patient is a poor candidate for immunosuppressive therapy.  4.  Acute bronchitis and chronic obstructive pulmonary disease exacerbation. The patient was treated with IV steroids. She has completed a course of IV Rocephin and Zithromax. She has received nebulizer treatments. The patient was counseled to quit smoking. Subsequently, steroids were changed to p.o. and were tapered and stopped subsequently in view of her hallucinations, as recommended by psychiatry. Overall, the patient's clinical situation improved at a slower pace with prolonged hospital course. She was being seen by physical therapy for deconditioning. They have recommended skilled  nursing facility. The patient has refused to  go to a skilled nursing inpatient facility and said she prefers going home with home health. Case management was consulted, and home health was arranged.  5.  The patient was complaining of chronic diarrhea. Cholestyramine was added to the regimen, and her diarrhea significantly improved according to the previous hospitalist note.  6.  Diabetes. As the patient was having hypoglycemia in the morning, bedtime snack was increased and long-acting insulin dose was decreased.   MEDICATIONS AT THE TIME OF DISCHARGE:  Regular insulin 2 times a day per sliding scale, clobetasol topical cream applied to affected area 2 times a day, diltiazem extended-release 120 mg once every 12 hours, calcium 2 capsules p.o. 3 times a day with meals, Reglan 10 mg 1 tablet p.o. 4 times a day before meals, oxycodone 10 mg 1 tablet p.o. 3 to 4 times a day for pain, Colace 100 mg p.o. 2 times a day as needed for constipation, ProAir 2 puff inhalation 3 to 4 times a day as needed for shortness of breath, Lantus 15 units subcutaneously once daily, venlafaxine  1 capsule p.o. once daily, cholestyramine 4 grams orally once a day as needed for loose stools, alprazolam 0.25 mg 1 tablet p.o. every 8 hours as needed for anxiety, Klonopin 0.5 mg 1 tablet p.o. 2 times a day, metoprolol tartrate 25 mg 1 tablet 2 times a day, hydralazine 100 mg p.o. every 8 hours.   ACTIVITY:  As tolerated.   FOLLOWUP:  Follow up with [facility] in one week, primary care physician in a week, and rheumatology, Dr. Jefm Bryant, in 1 to 2 weeks, as well as outpatient followup with psychiatry in 1 to 2 weeks.   CODE STATUS:  She is a full code.   DIET:  Low sodium, diabetic.   LABORATORY DATA AND IMAGING STUDIES:  Accu-Chek was 145 on November 11. Glucose was 203, BUN 35, creatinine 2.80, sodium 133, potassium was normal, chloride was normal, anion gap was at 4, and calcium was 7.7.   The diagnosis and plan of care was discussed with the patient. She verbalized  understanding of the plan.  TOTAL TIME SPENT ON THE DISCHARGE:  45 minutes.    ____________________________ Nicholes Mango, MD ag:nb D: 11/04/2014 22:11:52 ET T: 11/04/2014 23:05:41 ET JOB#: [id number]  [id number] Tongela Encinas, MD, <Dictator> [facility] Eugene Gavia Leeanne Mannan., MD Unknown PCP     Nicholes Mango MD ELECTRONICALLY SIGNED 11/07/2014 22:10

## 2015-04-13 NOTE — Op Note (Signed)
PATIENT NAME:  [name], [name] MR#:  [id number] DATE OF BIRTH:  [birth year]  DATE OF PROCEDURE:  10/20/2014  PREOPERATIVE DIAGNOSIS:   Acute on chronic renal failure with need for dialysis.    POSTOPERATIVE DIAGNOSIS:  Acute on chronic renal failure with need for dialysis.    PROCEDURES:  1. Ultrasound guidance for vascular access to the right femoral vein.  2. Placement of non-tunneled dialysis catheter into the right femoral vein.  SURGEON: Algernon Huxley, MD    ANESTHESIA: Local.   BLOOD LOSS: Minimal.   INDICATION FOR PROCEDURE: A 49 year old female who was admitted several days prior with multiple issues. She has worsening acute on chronic renal failure. She has stage III chronic kidney disease at baseline. Her BUN is now in excess of 100. She has volume overload and shortness of breath and needs dialysis. We are asked to place a dialysis catheter. Risks and benefits were discussed. Informed consent was obtained.    DESCRIPTION OF THE PROCEDURE: The patient's right groin was sterilely prepped and draped and a sterile surgical field was created. The right femoral vein was visualized with ultrasound and found to be patent. It was then accessed under direct ultrasound guidance without difficulty with a Seldinger needle and a permanent image was recorded. A J-wire was placed after skin nick and dilatation. The 30 cm DuoGlide dialysis catheter was then placed over the wire and the wire was removed. All lumens withdrew blood well and flushed easily with sterile saline. It was secured to the skin with 3 nylon sutures. A sterile dressing was placed. The patient tolerated the procedure well and remained in his bed in a stable condition.      ____________________________ Algernon Huxley, MD jsd:AT D: 10/22/2014 15:11:00 ET T: 10/22/2014 23:07:11 ET JOB#: [id number]  cc: Algernon Huxley, MD, <Dictator> Algernon Huxley MD ELECTRONICALLY SIGNED 10/24/2014 8:57

## 2015-04-13 NOTE — Op Note (Signed)
PATIENT NAME:  [name], DEMARSE MR#:  [id number] DATE OF BIRTH:  1966/04/18  DATE OF PROCEDURE:  10/24/2014  PREOPERATIVE DIAGNOSES: 1.  End-stage renal disease.  2.  Morbid obesity.  3.  Hepatitis C.  4.  Diabetes.   POSTOPERATIVE DIAGNOSES: 1.  End-stage renal disease.  2.  Morbid obesity.  3.  Hepatitis C.  4.  Diabetes.   PROCEDURES: 1.  Ultrasound guidance for vascular access to right internal jugular vein.  2.  Fluoroscopic guidance for placement of catheter.  3. Placement of a 19 cm tip-to-cuff tunneled hemodialysis catheter via the right internal jugular vein.   SURGEON:  Algernon Huxley, MD  ANESTHESIA: Local with sedation.   BLOOD LOSS: 25 mL.   INDICATION FOR PROCEDURE: A 49 year old female with history of chronic kidney disease who now has progressed to end-stage renal failure.  She needs a PermCath for permanent dialysis access.  The risks and benefits were discussed, informed consent is obtained.    DESCRIPTION OF THE PROCEDURE: The patient was brought to the vascular and interventional radiology suite. The patient's right neck and chest were sterilely prepped and draped and a sterile surgical field was created. The right internal jugular vein was visualized with ultrasound and found to be patent. It was then accessed under direct ultrasound guidance and a permanent image was recorded. A wire was placed. After a skin nick and dilatation, the peel-away sheath was placed over the wire.   I then turned my attention to an area under the clavicle. Approximately 2 fingerbreadths below the clavicle a small counter incision was created and we tunneled from the subclavicular incision to the access site. Using fluoroscopic guidance, a 19 cm tip-to-cuff tunneled hemodialysis catheter was selected, tunneled from the subclavicular incision to the access site. It was then placed through the peel-away sheath and the peel-away sheath was removed. The catheter tips were parked in the right atrium.  The appropriate distal connectors were placed. It withdrew blood well and flushed easily with heparinized saline and a concentrated heparin solution was then placed. It was secured to the chest wall with 2 Prolene sutures. The access incision was closed with a single 4-0 Monocryl. A 4-0 Monocryl pursestring suture was placed around the exit site. Sterile dressings were placed.   The patient tolerated the procedure well and was taken to the recovery room in stable condition.  .       ____________________________ Algernon Huxley, MD jsd:jp D: 10/24/2014 10:56:34 ET T: 10/24/2014 12:15:04 ET JOB#: [id number]  cc: Algernon Huxley, MD, <Dictator> Algernon Huxley MD ELECTRONICALLY SIGNED 11/05/2014 10:12

## 2015-04-13 NOTE — Op Note (Signed)
PATIENT NAME:  [name], [name] MR#:  [id number] DATE OF BIRTH:  1966-07-16  DATE OF PROCEDURE:  10/29/2014  PREOPERATIVE DIAGNOSIS: Chronic kidney disease with recovery  of renal function, no longer needing dialysis.   POSTOPERATIVE DIAGNOSIS: Chronic kidney disease with recovery  of renal function, no longer needing dialysis.   PROCEDURE: Removal of right internal jugular vein PermCath.  SURGEON:  Algernon Huxley, M.D.    ANESTHESIA:  Local.  ESTIMATED BLOOD LOSS:  Minimal.  INDICATION FOR PROCEDURE: This is a 49 year old female who was admitted with acute on chronic renal failure and was felt to have progressed to end-stage renal disease; however, after relief of an obstruction, her renal function has improved to the point where she no longer needs dialysis, and is felt to just be severe chronic kidney disease no needing dialysis and not end-stage renal disease as was initially thought. For this reason, she no longer needs her PermCath.   Risks and benefits were discussed and informed consent was obtained.   DESCRIPTION OF PROCEDURE:  The patient's right neck, chest, and existing catheter were sterilely prepped and draped.  The area around the catheter was anesthetized copiously with 1% Lidocaine. The catheter was dissected out with curved hemostats until the cuff was freed from the surrounding fibrous sheath.  The fibrous sheath was transected and the catheter was then removed in its entirety using gentle traction.  Pressure was held and sterile dressings placed.    The patient tolerated the procedure well and was taken to the recovery room in stable condition.   ____________________________ Algernon Huxley, MD jsd:JT D: 10/29/2014 12:42:31 ET T: 10/29/2014 14:25:06 ET JOB#: [id number]  cc: Algernon Huxley, MD, <Dictator> Algernon Huxley MD ELECTRONICALLY SIGNED 11/05/2014 10:12

## 2015-04-13 NOTE — Consult Note (Signed)
Patient with worssening ARFnow over 100.  Will place temp cath today  Electronic Signatures: Algernon Huxley (MD)  (Signed on 31-Oct-15 11:19)  Authored  Last Updated: 31-Oct-15 11:19 by Algernon Huxley (MD)

## 2015-04-13 NOTE — Consult Note (Signed)
PATIENT NAME:  [name], [name] MR#:  [id number] DATE OF BIRTH:  Sep 30, 1966  DATE OF CONSULTATION:  10/15/2014  REQUESTING PHYSICIAN:  Srikar R. Sudini, MD CONSULTING PHYSICIAN:  Zyairah Wacha Lilian Kapur, MD  REASON FOR CONSULTATION:  Acute renal failure in the setting of chronic kidney disease stage 3.    HISTORY OF PRESENT ILLNESS: The patient is a pleasant 49 year old Caucasian female with past medical history of hypertension, chronic diastolic heart failure, diabetes mellitus type 2, chronic kidney disease stage 3, hepatitis C, followed by [facility], depression, psoriasis, chronic neuropathy, splenomegaly, history of rheumatoid arthritis, who presented to [facility] with complaints of shortness of breath. The patient reports that she has had ongoing shortness of breath since September; however, recently it has worsened. Over the past week, she has had increasing shortness of breath as well as wheezing. She was admitted for COPD. We have been asked to see her for evaluation and management of acute renal failure in the setting of chronic kidney disease, stage 3.  She does not have an outpatient nephrologist. Her baseline creatinine is 1.75 with an eGFR of 34. Upon presentation, creatinine was 4.22. Today, creatinine is down to 4.0 with an eGFR of 13. The patient also appears to have anemia with a hemoglobin of 9.7. She states that she intermittently takes Advil. She had some periods of nausea and vomiting at home as well. She was taking Lasix despite poor p.o. intake at home. Urinalysis was performed and demonstrates proteinuria in the past with a urine protein greater than 500 mg/dL.   PAST MEDICAL HISTORY: 1.  Hypertension.  2.  Diastolic congestive heart failure.  3.  Diabetes mellitus type 2.  4.  Anemia of chronic kidney disease.  5.  Chronic kidney disease, stage 3.  6.  Hepatitis C, followed by [facility] hepatology.  7.  Depression.  8.  Psoriasis.  10.  Chronic neuropathy.  11.   History of splenomegaly.  12.  History of rheumatoid arthritis, status post treatment with Remicade.   ALLERGIES: ACETAMINOPHEN, CINNAMON, GARLIC, ONIONS.   CURRENT INPATIENT MEDICATIONS: Include: 1.  Normal saline 0.9 at 100 mL per hour. 2.  Azithromycin 500 mg IV q. 24 hours.  3.  Baclofen 10 mg p.o. t.i.d.  4.  Ceftriaxone 1 gram IV q. 24 hours.  5.  Diltiazem 120 mg p.o. q. 12 hours.  6.  Hydralazine 10 mg IV q. 4 hours p.r.n. systolic blood pressure greater than 180.  7.  Levemir 20 units subcutaneous at bedtime.  8.  Sliding scale insulin.  9.  Solu-Medrol 60 mg IV q. 6 hours.  10.  Metoprolol 25 mg p.o. b.i.d.  11.  Oxycodone 5 mg p.o. q. 4 hours p.r.n. pain.  12.  Protonix 40 mg daily.  13.  Phenergan 25 mg p.o. q. 6 hours p.r.n.   SOCIAL HISTORY: The patient is currently separated. She is unemployed now. She used to work as a Doctor, hospital. She has a history of tobacco abuse, which is active. Denies alcohol or illicit drug use.   FAMILY HISTORY: The patient's mother had history of diabetes, hypertension, and coronary artery disease. The patient's father also has history of coronary artery disease.   REVIEW OF SYSTEMS:  CONSTITUTIONAL: Denies fevers, chills, but has significant malaise and fatigue.  EYES: Denies diplopia, blurry vision.  HEENT: Denies headaches, hearing loss, tinnitus, epistaxis.  CARDIOVASCULAR: Denies chest pain, palpitations, PND.  RESPIRATORY: Has ongoing shortness of breath, cough, and wheezing.  GASTROINTESTINAL: Has had  some recent nausea and vomiting, but denies hematemesis or bloody stools.  GENITOURINARY: Denies frequency, urgency, or dysuria.  MUSCULOSKELETAL: Denies joint pain, swelling or redness.  INTEGUMENTARY: Denies skin rashes or lesions that are acute. She does have chronic psoriasis.  NEUROLOGIC: Denies focal extremity weakness. Does have history of peripheral neuropathy.  PSYCHIATRIC: Has history of depression.  ENDOCRINE: Denies  polyuria, polydipsia. Does have history of diabetes mellitus.  HEMATOLOGIC AND LYMPHATIC: Denies easy bruisability, bleeding, or swollen lymph nodes.  ALLERGY AND IMMUNOLOGIC: Denies seasonal allergies or history of immunodeficiency.   PHYSICAL EXAMINATION: VITAL SIGNS: Temperature 97.4, pulse 104, respirations 16, blood pressure 202/102.  HEENT: Normocephalic, atraumatic. Extraocular movements are intact. Pupils equal, round, reactive to light. No scleral icterus. Conjunctivae are pale. No epistaxis noted. Gross hearing intact. Oral mucosa is dry.  NECK: Supple without JVD or lymphadenopathy.  LUNGS: Demonstrate bilateral wheezing with slightly increased work of breathing.  CARDIOVASCULAR: S1 and S2, slightly tachycardic. No murmurs or rubs appreciated.  ABDOMEN: Soft, nontender, nondistended. Bowel sounds positive. No rebound or guarding. No gross organomegaly appreciated.  EXTREMITIES: No clubbing, cyanosis, or edema noted at this time.  SKIN: Warm and dry. No rashes noted.  MUSCULOSKELETAL: No joint redness, swelling, or tenderness appreciated.  NEUROLOGIC: The patient is awake, alert, and oriented to time, person, and place. Strength is 5/5 in both upper and lower extremities.  GENITOURINARY: No suprapubic tenderness is noted at this time.  PSYCHIATRIC: The patient with appropriate affect and appears to have good insight into her current illness.   LABORATORY DATA: Sodium 137, potassium 5.4, chloride 110,  CO2 of 18, BUN 78, creatinine 4.02, glucose 363, total protein 7.4, albumin 2.6, total bilirubin 0.2, alkaline phosphatase 60, AST 40, ALT 54. CBC shows WBC 10.9, hemoglobin 9.7, hematocrit 29.8, platelets 207,000. INR 1.1. Blood cultures x 2 sets are negative.   IMPRESSION: This is a 49 year old Caucasian female with past medical history of hypertension, diabetes mellitus, anemia of chronic kidney disease, chronic kidney disease stage 3, hepatitis C, followed by [facility], depression, psoriasis, history of chronic neuropathy, splenomegaly, history of rheumatoid arthritis, who presented to [facility] with increasing shortness of breath.   PROBLEM LIST: 1.  Acute renal failure secondary to acute tubular necrosis.  2.  Chronic kidney disease, stage 3. 3.  Anemia of chronic kidney disease.  4.  Metabolic acidosis.  5.  Mild hyperkalemia. 6.  Acute chronic obstructive pulmonary disease exacerbation.   PLAN: The patient presents with acute renal failure. This appears to be secondary to acute tubular necrosis. The patient had poor p.o. intake recently and kept taking furosemide. Intermittently, she has taken Advil as well. Therefore, suspect acute tubular necrosis. Agree with IV fluid hydration. Also agree with abdominal ultrasound, which will include the kidneys. In addition, will check SPEP, UPEP, ANA, ANCA antibodies, GBM antibodies, C3, C4, urinalysis, and urine protein to creatinine ratio. We will also screen the patient for secondary hyperparathyroidism. Would certainly avoid any nephrotoxins at this time. We will follow renal function as she continues to be hydrated. No urgent indication to administer Epogen; however, we will need to follow hemoglobin over the course of the hospitalization. Hopefully, the metabolic acidosis should improve as renal function improves. We will also plan to follow up serum potassium tomorrow as it is slightly elevated at present. In regards to hypertension, I agree with p.r.n. hydralazine use.   I would like to thank Dr. Darvin Neighbours for this kind referral. Further plan  as the patient progresses.    ____________________________ Tama High, MD mnl:LT D: 10/15/2014 15:58:00 ET T: 10/15/2014 16:18:59 ET JOB#: [id number]  cc: Tama High, MD, <Dictator> Tama High MD ELECTRONICALLY SIGNED 10/25/2014 10:56

## 2015-04-21 NOTE — Op Note (Signed)
PATIENT NAME:  [name], [name] MR#:  [id number] DATE OF BIRTH:  1966-06-04  DATE OF PROCEDURE:  02/28/2015  PREOPERATIVE DIAGNOSES: 1.  End-stage renal disease.  2.  Psoriasis.  3.  Hypertension.   POSTOPERATIVE DIAGNOSES:   1.  End-stage renal disease.  2.  Psoriasis.  3.  Hypertension.   PROCEDURE:  Left brachiocephalic arteriovenous fistula creation.   SURGEON:  Algernon Huxley, MD   ANESTHESIA: General.   ESTIMATED BLOOD LOSS: Minimal.   INDICATION FOR PROCEDURE: This is a 49 year old female who has need for permanent dialysis access as she has now transition to end-stage renal disease. Her preoperative vein mapping suggested no usable superficial veins, so the initial plan was for an AV graft with AV fistula placement if the vein was found to be usable at the time of surgery. Risks and benefits were discussed. Informed consent was obtained.   DESCRIPTION OF PROCEDURE: The patient was brought to the operative suite and after an adequate level of general anesthesia was obtained, the left arm was sterilely prepped and draped, and a sterile surgical field was created. An incision was created at the antecubital fossa and on her original incision it was clear that the cephalic vein was a sizable vein and appeared to be greater than 3 mm and usable for fistula creation. This was soft and pliable and did not feel scarred or thrombotic in any way.   This was dissected out, a couple of venous branch were ligated and divided between silk ties and this was marked for orientation and then dissected out the brachial artery, which was small at the antecubital fossa but usable for fistula creation. This was not heavily diseased. I encircled this with vessel loops proximally and distally and prepared for it for an anastomosis. The patient was given 3000 units of intravenous heparin for systemic anticoagulation. Control was pulled up on the vessel loops and anterior wall arteriotomy was created with an 11 blade  and extended with Potts scissors. The vein was then ligated distally with 2-0 silk tie and cut and beveled to an appropriate length to match the arteriotomy.   The anastomosis was then created with a running 6-0 Prolene suture in the usual fashion. The vessel was flushed and de-aired prior to releasing control, and on release, a single 6-0 Prolene patch suture was used for hemostasis and hemostasis was achieved.   Surgicel was placed. The wound was then closed with a 3-0 Vicryl and a 4-0 Monocryl. Dermabond was placed as a dressing. The patient was then awakened from anesthesia and taken to the recovery room in stable condition, having tolerated the procedure well.    ____________________________ Algernon Huxley, MD jsd:nt D: 02/28/2015 13:57:33 ET T: 02/28/2015 22:29:48 ET JOB#: [id number]  cc: Algernon Huxley, MD, <Dictator> Algernon Huxley MD ELECTRONICALLY SIGNED 03/11/2015 14:59

## 2015-04-21 NOTE — Discharge Summary (Signed)
PATIENT NAMEJULLIANNA, Wilson MR#:  [id number] DATE OF BIRTH:  [birth year]  DATE OF ADMISSION:  02/09/2015 DATE OF DISCHARGE:  02/15/2015  DISCHARGE DIAGNOSES:  1.  End stage renal disease started on hemodialysis.  2.  Hypertension.  3.  Weakness.  4.  Diabetes.   MEDICATIONS ON DISCHARGE:  1.  Insulin regular 5-10 units injectable 2 times a day subcutaneous as needed for sliding scale.  2.  Cardiac extended-release 120 mg 1 capsule 2 times a day.  3.  Calcium acetate 667 mg oral capsule 2 capsules 3 times a day.  4.  Reglan 10 mg oral tablet, 1 tablet 4 times a day as needed before meals and at bedtime.  5.  Colace 100 mg 1 capsule 2 times a day as needed for constipation.  6.  Cholestyramine 4 grams oral as needed for loose stool.  7.  Hydralazine 100 mg oral tablet every 8 hours.  8.  Lantus 25 units subcutaneous once a day.  9.  Metoprolol 25 mg 2 times a day.  10.  Oxycodone 10 mg oral tablet 3 times a day as needed for pain.  11.  Venlafaxine 75 mg oral capsule, 2 capsules oral once a day.  12.  Alprazolam 0.5 mg oral tablet every 6 hours as needed for anxiety.   FOLLOW UP:  She is advised to have a follow up appointment with nephrology, Dr. [name] and continue her dialysis.    HISTORY OF PRESENT ILLNESS: As per 02/09/2015 she is a 49 year old female with history of type 2 diabetes, osteoarthritis, hepatitis C and pneumatic fever, has chronic renal failure followed by Dr. Holley Raring in the office, but not on dialysis. She was seen by family physician the previous day. At that time labs were drawn. She was subsequently called in and told to go to the Emergency Room because her potassium was too high. In the Emergency Room she was noted to have acute on chronic renal failure with hyperkalemia, so admitted for further evaluation. She also had some diarrhea, nausea and vomiting but no fever.   HOSPITAL COURSE AND STAY:  1.  Acute on chronic renal failure. The patient was started on hemodialysis  as she had chronic renal failure and now worsening. She was labeled as end stage renal disease by nephrologist and started on dialysis. PermCath was placed and we arranged for dialysis as outpatient and she was discharged after that.  2.  Severe hypertension. This was controlled with Cardizem and hydralazine oral, increased dose of metoprolol and blood pressure came under control.  3.  Hyperkalemia.  Needed hemodialysis initially and then remained stable.  4.  Type 2 diabetes. Continued Lantus insulin sliding scale coverage and it remained under control.  5.  Mild hyponatremia, continued monitoring.  6.  Urinary tract infection started on Levaquin and she got 5 days of therapy so we stopped it on discharge.  7.  Blurred vision.  Retinopathy in the left eye, has had a long time and the symptoms resolved so no further work-up was done and she was advised to follow in the office.  8.  Generalized weakness. Physical therapy evaluation.  CONSULTATIONS IN THE HOSPITAL: Nephrology with Dr. Holley Raring.  LABORATORY DATA: WBC 10,000, hemoglobin 11, platelet count is 187,000, and MCV is 83, glucose 212, creatinine 3.68, sodium 132, potassium is 6.6, chloride 105, and CO2 is 20. The next day on 02/10/2015 on follow-up her creatinine was 3.46, potassium 5.2. Urinalysis was positive, 45 wbc's and 1+ leukocyte esterase.  Chest x-ray, PA and lateral, show new right jugular catheter. No active cardiopulmonary disease.   TOTAL TIME SPENT ON THIS DISCHARGE: 40 minutes.   ____________________________ Ceasar Lund Anselm Jungling, MD vgv:mc D: 02/19/2015 20:58:33 ET T: 02/20/2015 08:02:56 ET JOB#: [id number]  cc: Ceasar Lund. Anselm Jungling, MD, <Dictator> Dr. Ivin Poot [facility] MD ELECTRONICALLY SIGNED 03/04/2015 13:00

## 2015-04-21 NOTE — Consult Note (Signed)
Referring Physician:  Idelle Crouch   Primary Care Physician:  Darol Destine, [address], [place], [place] [address], [place] [phone]  Reason for Consult: Admit Date: 10-Feb-2015  Chief Complaint: weakness  Reason for Consult: leg weakness   History of Present Illness: History of Present Illness:   seen at request of Dr. Juleen China for weakness.  49 yo RHD F presents to [facility] secondary to hyperkalemia.  She states that she has had progressive weakness and numbness for the past 4 years.  She notes that this weakness and numbness worsened in October 2015 when she started getting renal problems.  She denies back pain but is noted to have arthritis from rheumatoid.  She denies muscle cramps.  She does report occasional dizziness but that is much better recently.  Her weakness does not fluctuate.  Nothing makes her weakness better or worse.  ROS:  General weakness  fatigue   HEENT no complaints   Lungs no complaints   Cardiac no complaints   GI no complaints   GU no complaints   Musculoskeletal back pain   Extremities no complaints   Skin rash   Neuro numbness/tingling   Endocrine no complaints   Psych depression   Past Medical/Surgical Hx:  CHF:   Depression:   psoriasis:   Arthritis:   Hep C:   Rheumatic Fever:   Diabetes:   Tonsillectomy:   Gall Bladder:   Past Medical/ Surgical Hx:  Past Medical History reviewed by me as above   Past Surgical History reviewed by me as above   Home Medications: Medication Instructions Last Modified Date/Time  hydrALAZINE 100 mg oral tablet 1 tab(s) orally every 8 hours 20-Feb-16 18:56  cholestyramine 4 gram(s) orally once a day, As Needed, loose stool , As needed, loose stool 20-Feb-16 18:56  insulin regular 5-10 unit(s) injectable 2 times a day (with meals), As Needed per sliding scale 20-Feb-16 18:56  diltiazem 12 hour extended release 120 mg/12 hours oral capsule, extended release 1 cap(s)  orally every 12 hours 20-Feb-16 18:56  calcium acetate 667 mg oral capsule 2 cap(s) orally 3 times a day with each meal 20-Feb-16 18:56  Reglan 10 mg oral tablet 1 tab(s) orally 4 times a day (before meals and at bedtime) 20-Feb-16 18:56  Colace sodium 100 mg oral capsule 1 cap(s) orally 2 times a day, As Needed - for Constipation 20-Feb-16 18:56  insulin regular human recombinant 100 units/mL injectable solution 5 to 10 unit(s) injectable 2 times a day (with meals), As Needed per sliding scale. 20-Feb-16 18:56  Lantus 100 units/mL subcutaneous solution 25 unit(s) subcutaneous once a day (at bedtime) 20-Feb-16 18:56  Metoprolol Tartrate 25 mg oral tablet 1 tab(s) orally 2 times a day 20-Feb-16 18:56  oxyCODONE 10 mg oral tablet 1 tab(s) orally up to 3 to 4 times a day as needed for pain. 20-Feb-16 18:56  venlafaxine 75 mg oral capsule, extended release 2 caps ($Remov'150mg'mOaFVT$ ) orally once a day (in the morning) 20-Feb-16 18:56   Allergies:  Acetaminophen: Anaphylaxis  Allergies:  Allergies NKDA   Social/Family History: Employment Status: disabled  Lives With: children  Living Arrangements: house  Social History: denies tob, no EtOh, no illicits  Family History: no seizures, no strokes, no neuropathy   Vital Signs: **Vital Signs.:   21-Feb-16 14:35  Vital Signs Type Pre Medication  Pulse Pulse 82  Systolic BP Systolic BP 884  Diastolic BP (mmHg) Diastolic BP (mmHg) 78  Mean BP 103  Physical Exam: General: obese, NAD  HEENT: normocephalic, sclera nonicteric, oropharynx clear  Neck: supple, no JVD, no bruits  Chest: CTA B, no wheezing, good movement  Cardiac: RRR, no murmurs, no edema, 2+ pulses  Extremities: no C/C/E, FROM, diffuse rashes   Neurologic Exam: Mental Status: alert and oriented x 3, normal speech and language, follows complex commands  Cranial Nerves: PERRLA, EOMI, nl VF, face symmetric, tongue midline, shoulder shrug equal  Motor Exam: 5-/5 B UE, 4/5 B LE, nl tone, no  fasciculations  Deep Tendon Reflexes: 0/4 B, plantars mute  Sensory Exam: decreased temp and pin and vibration below waist slightly more on L than R, no sensory level  Coordination: FTN and HTS WNL   Lab Results: Hepatic:  21-Feb-16 04:50   Bilirubin, Total 0.2  Alkaline Phosphatase  45  SGPT (ALT) 36  SGOT (AST) 26  Total Protein, Serum  6.1  Albumin, Serum  2.3  Routine Chem:  20-Feb-16 15:25   Result Comment POTASSIUM/BUN/AST - Slight hemolysis, interpret results with  - caution. POTASSIUM - RESULTS VERIFIED BY REPEAT TESTING.  - READ-BACK PROCESS PERFORMED.  - NOTIFIED OF CRITICAL VALUE  - CALLED LAUREN CLOUDEN AT 8469 ON 02/09/15  - SNJ  Result(s) reported on 09 Feb 2015 at 03:42PM.    22:36   Phosphorus, Serum 4.7  21-Feb-16 04:50   Glucose, Serum  117  BUN  49  Creatinine (comp)  3.46  Sodium, Serum 140  Potassium, Serum  5.2  Chloride, Serum  113  CO2, Serum  19  Calcium (Total), Serum  7.7  Osmolality (calc) 293  eGFR (African American)  18  eGFR (Non-African American)  15 (eGFR values <69mL/min/1.73 m2 may be an indication of chronic kidney disease (CKD). Calculated eGFR, using the MRDR Study equation, is useful in  patients with stable renal function. The eGFR calculation will not be reliable in acutely ill patients when serum creatinine is changing rapidly. It is not useful in patients on dialysis. The eGFR calculation may not be applicable to patients at the low and high extremes of body sizes, pregnant women, and vegetarians.)  Anion Gap 8  Routine Hem:  21-Feb-16 04:50   WBC (CBC) 7.5  RBC (CBC)  3.06  Hemoglobin (CBC)  8.5  Hematocrit (CBC)  25.8  Platelet Count (CBC)  125  MCV 84  MCH 27.9  MCHC 33.1  RDW  14.7  Neutrophil % 49.4  Lymphocyte % 39.2  Monocyte % 8.3  Eosinophil % 2.5  Basophil % 0.6  Neutrophil # 3.7  Lymphocyte # 2.9  Monocyte # 0.6  Eosinophil # 0.2  Basophil # 0.0 (Result(s) reported on 10 Feb 2015 at 05:54AM.)    Impression/Recommendations: Recommendations:   prior notes reviewed by me reviewed by me   Peripheral neuropathy-  etiology is likely multifactorial from uncontrolled DM, uremia and probably Hep C as well.  This is a chronic problem over the past four years and is expected to gradually worsen and not improve.  Most of additional w/u can be done as outpatient. check SPEP/UPEP, heavy metals, B12/folate and TSH EMG/NCS with Dr. Manuella Ghazi in 2-3 weeks f/u with Dr. Manuella Ghazi in [facility] in 3-4 weeks will sign off, please call with questions  Electronic Signatures: Jamison Neighbor (MD)  (Signed 21-Feb-16 19:27)  Authored: REFERRING PHYSICIAN, Primary Care Physician, Consult, History of Present Illness, Review of Systems, PAST MEDICAL/SURGICAL HISTORY, HOME MEDICATIONS, ALLERGIES, Social/Family History, NURSING VITAL SIGNS, Physical Exam-, LAB RESULTS, Recommendations   Last  Updated: 21-Feb-16 19:27 by Jamison Neighbor (MD)

## 2015-04-21 NOTE — Op Note (Signed)
PATIENT NAME:  [name], [name] MR#:  [id number] DATE OF BIRTH:  1966-04-05  DATE OF PROCEDURE:  02/11/2015  PREOPERATIVE DIAGNOSES:   1.  End-stage renal disease.  2.  Hypertension. 3.  Diabetes. 4.  Hepatitis C.  POSTOPERATIVE DIAGNOSES: 1.  End-stage renal disease.  2.  Hypertension. 3.  Diabetes. 4.  Hepatitis C.  PROCEDURES: 1.  Ultrasound guidance for vascular access to right internal jugular vein.  2.  Fluoroscopic guidance for placement of catheter.  3. Placement of a 19 cm tip-to-cuff tunneled hemodialysis catheter via the right internal jugular vein.   SURGEON:  Algernon Huxley, MD  ANESTHESIA: Local with sedation.   BLOOD LOSS: Minimal.    INDICATION FOR PROCEDURE: A 49 year old female who is readmitted with severe hyperkalemia and chronic kidney disease now presents with progressive end-stage renal failure. She needs a PermCath for durable dialysis access as she will get permanent AV fistula graft as an outpatient. She has previously had renal disease that was thought to be end-stage; however, recovered. This time, however, it is felt she will not recover and this will be in end-stage renal disease.   DESCRIPTION OF THE PROCEDURE: The patient was brought to the vascular and interventional radiology suite. The patient's right neck and chest were sterilely prepped and draped and a sterile surgical field was created. The right internal jugular vein was visualized with ultrasound and found to be patent. It was then accessed under direct ultrasound guidance and a permanent image was recorded. A wire was placed. After a skin nick and dilatation, the peel-away sheath was placed over the wire.   I then turned my attention to an area under the clavicle. Approximately 2 fingerbreadths below the clavicle a small counter incision was created and we tunneled from the subclavicular incision to the access site. Using fluoroscopic guidance, a 19 cm tip-to-cuff tunneled hemodialysis catheter was  selected, tunneled from the subclavicular incision to the access site. It was then placed through the peel-away sheath and the peel-away sheath was removed. The catheter tips were parked in the right atrium. The appropriate distal connectors were placed. It withdrew blood well and flushed easily with heparinized saline and a concentrated heparin solution was then placed. It was secured to the chest wall with 2 Prolene sutures. The access incision was closed with a single 4-0 Monocryl. A 4-0 Monocryl pursestring suture was placed around the exit site. Sterile dressings were placed.   The patient tolerated the procedure well and was taken to the recovery room in stable condition.    ____________________________ Algernon Huxley, MD jsd:mc D: 02/11/2015 10:22:47 ET T: 02/11/2015 10:49:10 ET JOB#: [id number]  cc: Algernon Huxley, MD, <Dictator> Algernon Huxley MD ELECTRONICALLY SIGNED 02/28/2015 10:35

## 2015-04-21 NOTE — H&P (Signed)
PATIENT NAME:  [name], [name] MR#:  [id number] DATE OF BIRTH:  24-Nov-1966  DATE OF ADMISSION:  02/09/2015  REFERRING PHYSICIAN: Dr. Thomasene Lot.  FAMILY PHYSICIAN: Dr. Mancel Bale at [facility].   NEPHROLOGIST: Dr. Holley Raring.   REASON FOR ADMISSION: Acute on chronic renal failure with hyperkalemia.   HISTORY OF PRESENT ILLNESS: The patient is a 49 year old female with a history of type 2 diabetes, osteoarthritis, hepatitis C, and rheumatic fever. Has chronic renal failure followed by Dr. Holley Raring, but is not dialysis dependent at this time. She was seen by her family physician yesterday, at which time labs were drawn. She was subsequently called and told to go to the Emergency Room because her potassium was too high. In the Emergency Room, the patient was noted to have acute on chronic renal failure with persistent hyperkalemia. She is now admitted for further evaluation. She has been having some diarrhea, but no nausea or vomiting. No fever.   PAST MEDICAL HISTORY:  1.  Stage III chronic kidney disease.  2.  Anxiety/depression.  3.  Psoriasis.  4.  Osteoarthritis.  5.  History of hepatitis C.  6.  Type 2 diabetes.  7.  History of rheumatic fever.  8.  History of congestive heart failure.  9.  Status post cholecystectomy.   MEDICATIONS:  1.  Effexor-XR 75 mg p.o. daily.  2.  Reglan 10 mg p.o. before meals and at bedtime.  3.  Albuterol 2 puffs q. 4 hours p.r.n. shortness of breath.  4.  Oxycodone 10 mg p.o. q. 6 hours p.r.n. pain.  5.  Lopressor 25 mg p.o. b.i.d.  6.  Lantus 25 units subcutaneous at bedtime.  7.  Sliding scale insulin as directed.  8.  Hydralazine 100 mg p.o. t.i.d.  9.  Cardizem CD 120 mg p.o. b.i.d.  10.  Klonopin 0.5 mg p.o. b.i.d.  11.  Calcium acetate 667 mg 2 capsules t.i.d.   ALLERGIES: ACETAMINOPHEN.   SOCIAL HISTORY: The patient continues to smoke at least 1 pack per day. Denies alcohol abuse.   FAMILY HISTORY: Positive for diabetes, stroke, coronary artery  disease, and breast cancer.   REVIEW OF SYSTEMS:  CONSTITUTIONAL: No fever or change in weight.  EYES: No blurred or double vision. No glaucoma.  EARS, NOSE, THROAT: No tinnitus or hearing loss. No nasal discharge or bleeding. No difficulty swallowing.  RESPIRATORY: No cough or wheezing. Denies hemoptysis. No painful respiration.  CARDIOVASCULAR: No chest pain or orthopnea. No palpitations or syncope.  GASTROINTESTINAL: No nausea, vomiting, although she has had some mild diarrhea which apparently has been chronic. No abdominal pain.  GENITOURINARY: No dysuria or hematuria. No incontinence. ENDOCRINE: No polyuria or polydipsia. No heat or cold intolerance.  HEMATOLOGICAL: The patient denies anemia, easy bruising, or bleeding. LYMPHATIC: No swollen glands.  MUSCULOSKELETAL: The patient denies pain in her neck, shoulders, knees, or hips. She has chronic back pain. No gout.  NEUROLOGICAL: No numbness or migraines. Denies stroke or seizures.  PSYCHIATRIC: The patient denies anxiety, insomnia, or depression.  PHYSICAL EXAMINATION:  GENERAL: The patient is in no acute distress.  VITAL SIGNS: Initially remarkable for blood pressure 163/113 with a heart rate of 67, and a respiratory rate of 16 with a temperature of 98 and saturation of 98% on room air.  HEENT: Normocephalic, atraumatic. Pupils equal, round, and reactive to light and accommodation. Extraocular movements are intact. Sclerae are anicteric. Conjunctivae are clear. Oropharynx is clear.  NECK: Supple without JVD. No lymphadenopathy or thyromegaly noted.  LUNGS: Clear  to auscultation and percussion without wheezes, rales, or rhonchi. No dullness. Respiratory effort is normal.  CARDIAC: Reveals a regular rate and rhythm. Normal S1 and S2. No significant rubs or gallops. PMI is nondisplaced. Chest wall is nontender.  ABDOMEN: Soft, nontender with normoactive bowel sounds. No organomegaly or masses were appreciated. No hernias or bruits were  noted.  EXTREMITIES: Without clubbing, cyanosis, or edema. Pulses were 2+ bilaterally.  SKIN: Warm and dry without rash or lesions. NEUROLOGIC: Revealed cranial nerves II through XII grossly intact. Deep tendon reflexes were symmetric. Motor and sensory exams nonfocal.  PSYCHIATRIC: Revealed a patient who is alert and oriented to person, place, and time. She was cooperative and used good judgment.   LABORATORY DATA: EKG revealed sinus rhythm at 69 beats per minute with no acute ischemic changes. Her white count was 10.0 with a hemoglobin of 11.0. Glucose 212 with a BUN of 55, creatinine of 3.63, and a GFR of 14. Her sodium was 132 with a potassium of 6.6.   ASSESSMENT:  1.  Acute on chronic renal failure.  2.  Accelerated hypertension.  3.  Hyperkalemia.  4.  Type 2 diabetes.  5.  Mild hyponatremia.   PLAN: The patient will be admitted to telemetry because of her hyperkalemia. She was given Kayexalate and will be started on IV fluids. We will follow her sugars with Accu-Cheks before meals and at bedtime and add sliding scale insulin as needed. We will repeat a renal panel in 6 hours after initiation of therapy and repeat it once again in the morning. We will consult nephrology at this time. We will optimize her blood pressure regimen. She will be placed on a sodium restricted, carbohydrate-controlled diet for now. Further treatment and evaluation will depend upon the patient's progress.   TOTAL TIME SPENT ON THIS PATIENT: 50 minutes.    ____________________________ Leonie Douglas Doy Hutching, MD jds:ts D: 02/09/2015 18:02:50 ET T: 02/09/2015 18:20:46 ET JOB#: [id number]  cc: Leonie Douglas. Doy Hutching, MD, <Dictator> Dr. Mancel Bale, [facility] Draedyn Weidinger D Ziara Thelander MD ELECTRONICALLY SIGNED 02/10/2015 11:01

## 2015-04-24 ENCOUNTER — Other Ambulatory Visit: Payer: Self-pay | Admitting: Vascular Surgery

## 2015-04-24 DIAGNOSIS — N186 End stage renal disease: Secondary | ICD-10-CM

## 2015-05-03 ENCOUNTER — Other Ambulatory Visit: Payer: Self-pay | Admitting: Radiology

## 2015-05-06 ENCOUNTER — Ambulatory Visit: Admission: RE | Admit: 2015-05-06 | Payer: Self-pay | Source: Ambulatory Visit

## 2015-05-14 ENCOUNTER — Ambulatory Visit: Admission: RE | Admit: 2015-05-14 | Payer: Medicare Other | Source: Ambulatory Visit | Admitting: Vascular Surgery

## 2015-05-14 ENCOUNTER — Encounter: Admission: RE | Payer: Self-pay | Source: Ambulatory Visit

## 2015-05-14 SURGERY — A/V SHUNTOGRAM/FISTULAGRAM
Anesthesia: Moderate Sedation

## 2015-06-11 ENCOUNTER — Encounter: Admission: RE | Payer: Self-pay | Source: Ambulatory Visit

## 2015-06-11 ENCOUNTER — Ambulatory Visit: Admission: RE | Admit: 2015-06-11 | Payer: Medicare Other | Source: Ambulatory Visit | Admitting: Vascular Surgery

## 2015-06-11 SURGERY — DIALYSIS/PERMA CATHETER REMOVAL
Anesthesia: Moderate Sedation

## 2015-06-11 SURGERY — A/V SHUNTOGRAM/FISTULAGRAM
Anesthesia: Moderate Sedation

## 2015-06-11 NOTE — H&P (Signed)
[facility] Admission History & Physical  MRN : [id number]  [name] is a 49 y.o. (January 06, 1966) female who presents with chief complaint of complication of dialysis fistula.  History of Present Illness: Patient is sent in by the dialysis center for problems with her access. They've been having difficulties with prolonged bleeding following dialysis as well as poor dialysis parameters. Her last duplex ultrasound dated 04/16/2015 demonstrates a moderate to severe stenosis at the venous cannulation portion as well as a moderate to severe stenosis at the subclavian cephalic confluence.  Patient denies fever chills. She denies arm pain.  No current facility-administered medications for this encounter.   No current outpatient prescriptions on file.    No past medical history on file.  No past surgical history on file.  Social History History  Substance Use Topics  . Smoking status: Not on file  . Smokeless tobacco: Not on file  . Alcohol Use: Not on file    Family History No family history on file. no porphyria no neurofibromatosis  Allergies not on file   REVIEW OF SYSTEMS (Negative unless checked)  Constitutional: [] Weight loss  [] Fever  [] Chills Cardiac: [] Chest pain   [] Chest pressure   [] Palpitations   [] Shortness of breath when laying flat   [] Shortness of breath at rest   [] Shortness of breath with exertion. Vascular:  [] Pain in legs with walking   [] Pain in legs at rest   [] Pain in legs when laying flat   [] Claudication   [] Pain in feet when walking  [] Pain in feet at rest  [] Pain in feet when laying flat   [] History of DVT   [] Phlebitis   [] Swelling in legs   [] Varicose veins   [] Non-healing ulcers Pulmonary:   [] Uses home oxygen   [] Productive cough   [] Hemoptysis   [] Wheeze  [] COPD   [] Asthma Neurologic:  [] Dizziness  [] Blackouts   [] Seizures   [] History of stroke   [] History of TIA  [] Aphasia   [] Temporary blindness   [] Dysphagia   [] Weakness  or numbness in arms   [] Weakness or numbness in legs Musculoskeletal:  [] Arthritis   [] Joint swelling   [] Joint pain   [] Low back pain Hematologic:  [] Easy bruising  [] Easy bleeding   [] Hypercoagulable state   [] Anemic  [] Hepatitis Gastrointestinal:  [] Blood in stool   [] Vomiting blood  [] Gastroesophageal reflux/heartburn   [] Difficulty swallowing. Genitourinary:  [] Chronic kidney disease   [] Difficult urination  [] Frequent urination  [] Burning with urination   [] Blood in urine Skin:  [] Rashes   [] Ulcers   [] Wounds Psychological:  [] History of anxiety   []  History of major depression.  Physical Examination  There were no vitals filed for this visit. There is no height or weight on file to calculate BMI.  Head: [place]/AT, No temporalis wasting.  Ear/Nose/Throat: Hearing grossly intact, nares w/o erythema or drainage, oropharynx w/o Erythema/Exudate, Mallampati score: Class II.  Dentition poor.  Eyes: PERRLA, EOMI.  Neck: Supple, no nuchal rigidity.  No bruit or JVD.  Pulmonary:  Good air movement, clear to auscultation bilaterally, no increased work of respiration or use of accessory muscles  Cardiac: RRR, normal S1, S2, no Murmurs, rubs or gallops. Vascular: Left arm AV fistula consistent with a brachiocephalic mild to moderate aneurysmal duration at the cannulation sites with an associated narrowing causing marked pulsatility no evidence of infection or skin breakdown palpable thrill audible bruit Gastrointestinal: soft, non-tender/non-distended. No guarding/reflex. No masses, surgical incisions, or scars. Musculoskeletal: M/S 5/5 throughout.  No deformity  or atrophy. Neurologic: CN 2-12 intact. Pain and light touch intact in extremities.  Symmetrical.  Speech is fluent. Motor exam as listed above. Psychiatric: Judgment intact, Mood & affect appropriate for pt's clinical situation. Dermatologic: No rashes or ulcers noted.  No cellulitis or open wounds. Lymph : No Cervical, Axillary, or Inguinal  lymphadenopathy.  CBC Lab Results  Component Value Date   WBC 9.2 10/29/2014   HGB 7.6* 10/29/2014   HCT 23.0* 10/29/2014   MCV 87 10/29/2014   PLT 134* 10/29/2014    BMET    Component Value Date/Time   NA 133* 10/31/2014 0955   K 4.7 10/31/2014 0955   CL 102 10/31/2014 0955   CO2 27 10/31/2014 0955   GLUCOSE 203* 10/31/2014 0955   BUN 35* 10/31/2014 0955   CREATININE 2.80* 10/31/2014 0955   CALCIUM 7.7* 10/31/2014 0955   GFRNONAA 34* 06/02/2013 1405   GFRAA 39* 06/02/2013 1405   CrCl cannot be calculated (Unknown ideal weight.).  COAG Lab Results  Component Value Date   INR 1.1 10/15/2014   INR 0.9 12/29/2012    Assessment/Plan Complications of the AV dialysis device with poor dialysis function prolonged bleeding sent by dialysis Center.  Plan: Patient will undergo contrast injection of the left arm fistula with hope for intervention. The risks and benefits are reviewed all questions are answered patient agrees to proceed. Goal of procedures to provide her with a functional  access   Daylen Lipsky, Dolores Lory, MD  06/11/2015 10:26 AM

## 2015-07-02 ENCOUNTER — Other Ambulatory Visit: Payer: Self-pay

## 2015-07-02 ENCOUNTER — Ambulatory Visit: Payer: Medicare Other | Admitting: Licensed Clinical Social Worker

## 2015-07-25 ENCOUNTER — Encounter
Admission: RE | Disposition: A | Discharge status: Home / Self Care | Payer: Self-pay | Source: Ambulatory Visit | Attending: Vascular Surgery

## 2015-07-25 ENCOUNTER — Ambulatory Visit: Admit: 2015-07-25 | Payer: Self-pay | Admitting: Vascular Surgery

## 2015-07-25 ENCOUNTER — Ambulatory Visit
Admission: RE | Admit: 2015-07-25 | Discharge: 2015-07-25 | Disposition: A | Discharge status: Home / Self Care | Payer: Medicare Other | Source: Ambulatory Visit | Attending: Vascular Surgery | Admitting: Vascular Surgery

## 2015-07-25 ENCOUNTER — Encounter: Payer: Self-pay | Admitting: *Deleted

## 2015-07-25 DIAGNOSIS — N186 End stage renal disease: Secondary | ICD-10-CM | POA: Diagnosis not present

## 2015-07-25 DIAGNOSIS — Z79899 Other long term (current) drug therapy: Secondary | ICD-10-CM | POA: Diagnosis not present

## 2015-07-25 DIAGNOSIS — J439 Emphysema, unspecified: Secondary | ICD-10-CM | POA: Insufficient documentation

## 2015-07-25 DIAGNOSIS — Z992 Dependence on renal dialysis: Secondary | ICD-10-CM | POA: Diagnosis not present

## 2015-07-25 DIAGNOSIS — F329 Major depressive disorder, single episode, unspecified: Secondary | ICD-10-CM | POA: Insufficient documentation

## 2015-07-25 DIAGNOSIS — T82858A Stenosis of vascular prosthetic devices, implants and grafts, initial encounter: Secondary | ICD-10-CM | POA: Insufficient documentation

## 2015-07-25 DIAGNOSIS — F17200 Nicotine dependence, unspecified, uncomplicated: Secondary | ICD-10-CM | POA: Diagnosis not present

## 2015-07-25 DIAGNOSIS — E780 Pure hypercholesterolemia: Secondary | ICD-10-CM | POA: Insufficient documentation

## 2015-07-25 DIAGNOSIS — I120 Hypertensive chronic kidney disease with stage 5 chronic kidney disease or end stage renal disease: Secondary | ICD-10-CM | POA: Insufficient documentation

## 2015-07-25 DIAGNOSIS — F419 Anxiety disorder, unspecified: Secondary | ICD-10-CM | POA: Insufficient documentation

## 2015-07-25 DIAGNOSIS — I509 Heart failure, unspecified: Secondary | ICD-10-CM | POA: Insufficient documentation

## 2015-07-25 HISTORY — DX: Chronic obstructive pulmonary disease, unspecified: J44.9

## 2015-07-25 HISTORY — PX: PERIPHERAL VASCULAR CATHETERIZATION: SHX172C

## 2015-07-25 HISTORY — DX: Major depressive disorder, single episode, unspecified: F32.9

## 2015-07-25 HISTORY — DX: Essential (primary) hypertension: I10

## 2015-07-25 HISTORY — DX: Inflammatory liver disease, unspecified: K75.9

## 2015-07-25 HISTORY — DX: Anxiety disorder, unspecified: F41.9

## 2015-07-25 HISTORY — DX: Anemia, unspecified: D64.9

## 2015-07-25 HISTORY — DX: Depression, unspecified: F32.A

## 2015-07-25 HISTORY — DX: Unspecified osteoarthritis, unspecified site: M19.90

## 2015-07-25 SURGERY — DIALYSIS/PERMA CATHETER REMOVAL
Anesthesia: Moderate Sedation

## 2015-07-25 SURGERY — A/V SHUNTOGRAM/FISTULAGRAM
Anesthesia: Moderate Sedation

## 2015-07-25 MED ORDER — HEPARIN (PORCINE) IN NACL 2-0.9 UNIT/ML-% IJ SOLN
INTRAMUSCULAR | Status: AC
  Filled 2015-07-25: qty 500

## 2015-07-25 MED ORDER — CEFAZOLIN SODIUM 1-5 GM-% IV SOLN
INTRAVENOUS | Status: AC
  Filled 2015-07-25: qty 50

## 2015-07-25 MED ORDER — LIDOCAINE-EPINEPHRINE (PF) 1 %-1:200000 IJ SOLN
INTRAMUSCULAR | Status: AC
  Filled 2015-07-25: qty 30

## 2015-07-25 MED ORDER — SODIUM CHLORIDE 0.9 % IV SOLN
INTRAVENOUS | Status: DC
  Administered 2015-07-25: 12:00:00 via INTRAVENOUS

## 2015-07-25 MED ORDER — IOHEXOL 300 MG/ML  SOLN
INTRAMUSCULAR | Status: DC | PRN
  Administered 2015-07-25: 65 mL

## 2015-07-25 MED ORDER — MIDAZOLAM HCL 2 MG/2ML IJ SOLN
INTRAMUSCULAR | Status: DC | PRN
  Administered 2015-07-25 (×3): 1 mg via INTRAVENOUS
  Administered 2015-07-25: 2 mg via INTRAVENOUS

## 2015-07-25 MED ORDER — HEPARIN SODIUM (PORCINE) 1000 UNIT/ML IJ SOLN
INTRAMUSCULAR | Status: AC
  Filled 2015-07-25: qty 1

## 2015-07-25 MED ORDER — FENTANYL CITRATE (PF) 100 MCG/2ML IJ SOLN
INTRAMUSCULAR | Status: AC
  Filled 2015-07-25: qty 2

## 2015-07-25 MED ORDER — HEPARIN SODIUM (PORCINE) 1000 UNIT/ML IJ SOLN
INTRAMUSCULAR | Status: DC | PRN
  Administered 2015-07-25: 3000 [IU] via INTRAVENOUS

## 2015-07-25 MED ORDER — FENTANYL CITRATE (PF) 100 MCG/2ML IJ SOLN
INTRAMUSCULAR | Status: DC | PRN
  Administered 2015-07-25: 50 ug via INTRAVENOUS
  Administered 2015-07-25: 25 ug via INTRAVENOUS
  Administered 2015-07-25 (×2): 50 ug via INTRAVENOUS

## 2015-07-25 MED ORDER — CEFAZOLIN SODIUM 1-5 GM-% IV SOLN
1.0000 g | Freq: Once | INTRAVENOUS | Status: AC
  Administered 2015-07-25: 1 g via INTRAVENOUS

## 2015-07-25 MED ORDER — MIDAZOLAM HCL 5 MG/5ML IJ SOLN
INTRAMUSCULAR | Status: AC
  Filled 2015-07-25: qty 5

## 2015-07-25 SURGICAL SUPPLY — 15 items
BALLN DORADO 9X40X80 (BALLOONS) ×4
BALLN LUTONIX DCB 7X40X130 (BALLOONS) ×4
BALLN LUTONIX DCB 7X60X130 (BALLOONS) ×4
BALLOON DORADO 9X40X80 (BALLOONS) ×2 IMPLANT
BALLOON LUTONIX DCB 7X40X130 (BALLOONS) ×2 IMPLANT
BALLOON LUTONIX DCB 7X60X130 (BALLOONS) ×2 IMPLANT
CATH KUMPE (CATHETERS) ×2
CATH SLIP 5FR 0.38 X 40 KMP (CATHETERS) ×2 IMPLANT
DEVICE PRESTO INFLATION (MISCELLANEOUS) ×4 IMPLANT
DRAPE BRACHIAL (DRAPES) ×4 IMPLANT
KIT 5FR STIFF NT/TG (MISCELLANEOUS) ×4 IMPLANT
PACK ANGIOGRAPHY (CUSTOM PROCEDURE TRAY) ×4 IMPLANT
SHEATH BRITE TIP 6FRX5.5 (SHEATH) ×4 IMPLANT
TOWEL OR 17X26 4PK STRL BLUE (TOWEL DISPOSABLE) ×4 IMPLANT
WIRE MAGIC TOR.035 180C (WIRE) ×4 IMPLANT

## 2015-07-25 NOTE — Discharge Instructions (Signed)

## 2015-07-25 NOTE — CV Procedure (Signed)
[facility]    OPERATIVE NOTE   PROCEDURE: 1.   Left brachiocephalic arteriovenous fistula cannulation under ultrasound guidance in both an antegrade and retrograde fashion 2.   left arm fistulagram including central venogram 3.   Percutaneous transluminal angioplasty of perianastomotic stenosis with 7 mm diameter drug-coated angioplasty balloon 4.   Percutaneous transluminal angioplasty of mid upper arm cephalic vein stenosis with 7 mm diameter drug-coated angioplasty balloon and 9 mm diameter high pressure angioplasty balloon  PRE-OPERATIVE DIAGNOSIS: 1. ESRD 2. Poorly functional left brachiocephalic AVF with poor maturation and inability to access the fistula and run it properly  POST-OPERATIVE DIAGNOSIS: same as above   SURGEON: Leotis Pain, MD  ANESTHESIA: local with MCS  ESTIMATED BLOOD LOSS: 25 cc  FINDING(S): 1. 65-70% perianastomotic stenosis and 60-65% mid upper arm cephalic vein stenosis separate and distinct.  SPECIMEN(S):  None  CONTRAST: 35 cc  INDICATIONS: [name] is a 49 y.o. female who presents with malfunctioning  left brachiocephalic arteriovenous fistula with poor maturation and inability to successfully cannulate the fistula and use it for dialysis regularly.  The patient is scheduled for  left arm fistulagram.  The patient is aware the risks include but are not limited to: bleeding, infection, thrombosis of the cannulated access, and possible anaphylactic reaction to the contrast.  The patient is aware of the risks of the procedure and elects to proceed forward.  DESCRIPTION: After full informed written consent was obtained, the patient was brought back to the angiography suite and placed supine upon the angiography table.  The patient was connected to monitoring equipment.  The  left arm was prepped and draped in the standard fashion for a percutaneous access intervention.  Under ultrasound guidance, the  left arteriovenous fistula  was cannulated with a micropuncture needle under direct ultrasound guidance in an antegrade fashion near the arterial access site and a permanent image was performed.  The microwire was advanced into the fistula and the needle was exchanged for the a microsheath.  I then upsized to a 6 Fr Sheath and imaging was performed.  Hand injections were completed to image the access including the central venous system. This demonstrated about a 60-65% stenosis in the mid upper arm cephalic vein near the venous access site and near a large lateral branch as well as a 65-70% stenosis in the perianastomotic segment of the fistula just into the cephalic vein.  Based on the images, this patient will need treatment for both areas to improve the flow and make this a more usable fistula. A separate access will be required to treat the perianastomotic stenosis in a retrograde fashion. I then gave the patient 3000 units of intravenous heparin.  I then crossed the mid upper arm cephalic vein stenosis with a Magic Tourqe wire through the antegrade sheath.  Based on the imaging, a 7 mm x 6 cm  Lutonix drug-coated angioplasty balloon was selected.  The balloon was centered around the mid upper arm cephalic vein stenosis and inflated to 12 ATM for 1 minute(s).  This was slightly undersized but was the largest drug-coated balloon available. I then upsized to a 9 mm diameter by 4 cm length high pressure angioplasty balloon inflated to 22 atm for 1 minute. On completion imaging, a 15-20 % residual stenosis was present.  The sheath was then removed with a 4-0 Monocryl pursestring suture placed and pressure held.  I then turned my attention to gaining retrograde access to treat the perianastomotic stenosis.  Ultrasound was used to visualize the cephalic vein in the proximal to mid upper arm more proximal to the area that was just treated. Ultrasound was used to access the cephalic vein in a retrograde fashion with a micropuncture needle and a  permanent image was recorded. A micropuncture wire and sheath were then placed and we upsized to a 6 [place] sheath. I navigated through the perianastomotic stenosis with a Kumpe catheter and the Magic torque wire. I parked the Magic torque wire into the radial artery. I then treated the perianastomotic stenosis with the distal tip of the balloon just into the brachial artery encompassing the lesion in the initial part of the cephalic vein portion of the fistula as well. A 7 mm diameter by 4 cm Lutonix drug-coated angioplasty balloon was selected. It was inflated to 10-12 atm for 1 minute deflated. Completion angiogram performed through the Kumpe catheter at the anastomosis demonstrated about a 20% residual stenosis that was not flow limiting.   Based on the completion imaging, no further intervention is necessary.  The wire and balloon were removed from the sheath.  A 4-0 Monocryl purse-string suture was sewn around the sheath.  The sheath was removed while tying down the suture.  A sterile bandage was applied to the puncture site.  COMPLICATIONS: None  CONDITION: Stable   ,  07/25/2015 2:11 PM

## 2015-07-25 NOTE — H&P (Addendum)
[facility] History & Physical Update  The patient was interviewed and re-examined.  The patient's previous History and Physical has been reviewed and is unchanged.  There is no change in the plan of care. We plan to proceed with the scheduled procedure.  Arless Vineyard, MD  07/25/2015, 11:02 AM  After discussions further with the patient, she has only used her fistula a few times for access without using the catheter and she has had multiple infiltrations and difficulties with access with her fistula. A fistulagram would be more prudent at this point then a removal of her catheter and we will perform this today. Risks and benefits were discussed.

## 2015-07-26 ENCOUNTER — Encounter: Payer: Self-pay | Admitting: Vascular Surgery

## 2015-08-13 ENCOUNTER — Ambulatory Visit: Payer: Medicare Other | Admitting: Physical Therapy

## 2015-08-15 ENCOUNTER — Ambulatory Visit: Payer: Medicare Other | Attending: Nephrology | Admitting: Physical Therapy

## 2015-08-15 ENCOUNTER — Encounter: Payer: Medicare Other | Admitting: Physical Therapy

## 2015-08-15 ENCOUNTER — Encounter: Payer: Self-pay | Admitting: Physical Therapy

## 2015-08-15 DIAGNOSIS — M6281 Muscle weakness (generalized): Secondary | ICD-10-CM | POA: Diagnosis present

## 2015-08-15 DIAGNOSIS — R262 Difficulty in walking, not elsewhere classified: Secondary | ICD-10-CM | POA: Insufficient documentation

## 2015-08-15 NOTE — Therapy (Signed)
[facility] [address]. [place], [place], [address] Phone: [phone]   Fax:  [phone]  Physical Therapy Evaluation  Patient Details  Name: [name] MRN: [id number] Date of Birth: Sep 26, 1966 Referring Provider:  Anthonette Legato, MD  Encounter Date: 08/15/2015      PT End of Session - 08/15/15 1415    Visit Number 1   Number of Visits 8   Date for PT Re-Evaluation 09/12/15   Authorization - Visit Number 1   Authorization - Number of Visits 10   PT Start Time [id number]   PT Stop Time 1326   PT Time Calculation (min) 55 min   Equipment Utilized During Treatment Gait belt   Activity Tolerance Patient tolerated treatment well;Patient limited by fatigue   Behavior During Therapy [facility] for tasks assessed/performed      Past Medical History  Diagnosis Date  . Hypertension   . CHF (congestive heart failure)   . COPD (chronic obstructive pulmonary disease)   . Diabetes mellitus without complication   . Depression   . Anxiety   . Chronic kidney disease   . Arthritis   . Anemia   . Hepatitis     Past Surgical History  Procedure Laterality Date  . Cyst removed  from rt hand    . Tonsillectomy    . Appendectomy    . Rt. tubal and ovary removed    . Cholecystectomy    . Peripheral vascular catheterization N/A 07/25/2015    Procedure: A/V Shuntogram/Fistulagram;  Surgeon: Algernon Huxley, MD;  Location: [place] [facility];  Service: Cardiovascular;  Laterality: N/A;  . Peripheral vascular catheterization Left 07/25/2015    Procedure: A/V Shunt Intervention;  Surgeon: Algernon Huxley, MD;  Location: [place] [facility];  Service: Cardiovascular;  Laterality: Left;    There were no vitals filed for this visit.  Visit Diagnosis:  Muscle weakness (generalized)  Difficulty walking      Subjective Assessment - 08/15/15 1240    Subjective Pt reports feeling tired after dialysis yesterday. Pt states she often spends the day after  dialysis in bed not doing much. Pt reports relying on her boyfriend for all mobility and help with daily ADLs. Pt reports having 4 steps with no handrails to enter her house and being unable to do them since Sept 2015. Pt states she has chronic low back pain and right sided pain at an 8/10 with sitting.    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     Currently in Pain? Yes   Pain Score 8    Pain Location Back   Pain Orientation Right;Lower   Pain Type Chronic pain   Pain Onset More than a month ago   Pain Frequency Intermittent            [facility] PT Assessment - 08/15/15 0001    Assessment   Medical Diagnosis Muscle weakness   Onset Date/Surgical Date 08/21/14   Balance Screen   Has the patient fallen in the past 6 months Yes   How many times? 1x/week   Has the patient had a decrease in activity level because of a fear of falling?  Yes   Is the patient reluctant to leave their home because of a fear of falling?  Yes      B LE muscle strength grossly 4/5 MMT  except hip flexion/DF/hip abduction/glut med grossly 3/5 MMT. ROM: WFL B LE   LEFS: 13/80 BERG: 34/56  Grip: L (26.8#), R (36.8#) Objective: See HEP.  Nustep 5 min. L5 (cool down) with B UE/LE (no charge).          Plan - 2015/09/08 1416    Clinical Impression Statement Pt is a 49 y.o. F referred to PT for strengthening exercises. Pt reports falling once and week with walking or turning and having dizziness/ataxia over the past four years. Pt presents with chronic low back and right sided pain (8/10). Pt reports her pain decreasing with movevement throughout PT tx session. Pt's LE AROM is WFL. Muscle tightness noted with SLR, hip stretching, hip ER and piriformis stretching. Pt's MMT: B hip flexion, dorsiflexion, glut med, hip abduction 3/5; B knee flexion and extension 4/5. BERG: 34/56  due to inability to obtain any position without UE support. LEFS: 13/80. Pt ambulates with a SPC, forward flexed trunk, decreased hip extension and decreased heel strike. Pt ambulates with a decreased cadence and decreased step length bilaterally. Pt presents with a rounded shoulder, forward head and forward flexed posture; pt is able to correct with verbal cues but unable to maintain corrections. Pt will benefit from short term skilled PT to address balance, gait, strength and endurance deficits in order to become safer and more independent with ADLs and functional mobility.    Pt will benefit from skilled therapeutic intervention in order to improve on the following deficits Abnormal gait;Decreased activity tolerance;Decreased balance;Difficulty walking;Dizziness;Pain;Impaired flexibility;Improper body mechanics;Postural dysfunction;Decreased strength;Decreased mobility;Decreased endurance   Rehab Potential Fair   PT Frequency 2x / week   PT Duration 4 weeks   PT Treatment/Interventions ADLs/Self Care Home Management;Cryotherapy;Moist Heat;Manual techniques;Therapeutic exercise;Balance training;Therapeutic activities;Functional mobility training;Stair training;Gait training;Neuromuscular re-education   PT Next Visit Plan progress standing balance challenges outside base of support/strengthening for LE/gait with rollator/ complete a TUG and 5 time sit to stand test   PT Home Exercise Plan see handout    Recommended Other Services any form of activity even if only for 10 mins.   Consulted and Agree with Plan of Care Patient          G-Codes - 08-Sep-2015 1531    Functional Assessment Tool Used LEFS/ Berg/ clinical judgement/ pain/ gait    Functional Limitation Mobility: Walking and moving around   Mobility: Walking and Moving Around Current Status [phone]) At least 60 percent but less than 80 percent impaired, limited or restricted   Mobility: Walking and Moving Around Goal Status [phone]) At least 20  percent but less than 40 percent impaired, limited or restricted       Problem List There are no active problems to display for this patient.  Pura Spice, PT, DPT # [id number]   09-08-15, 3:34 PM  [facility] [address] [place], [place], [address] Phone: [phone]   Fax:  [phone]

## 2015-08-20 ENCOUNTER — Ambulatory Visit: Payer: Medicare Other | Admitting: Physical Therapy

## 2015-08-20 ENCOUNTER — Encounter: Payer: Medicare Other | Admitting: Physical Therapy

## 2015-08-22 ENCOUNTER — Encounter: Payer: Medicare Other | Admitting: Physical Therapy

## 2015-08-22 ENCOUNTER — Ambulatory Visit: Payer: Medicare Other | Admitting: Physical Therapy

## 2015-08-29 ENCOUNTER — Encounter: Payer: Medicare Other | Admitting: Physical Therapy

## 2015-08-29 ENCOUNTER — Ambulatory Visit: Payer: Medicare Other | Admitting: Physical Therapy

## 2015-09-03 ENCOUNTER — Ambulatory Visit: Payer: Medicare Other | Attending: Nephrology | Admitting: Physical Therapy

## 2015-09-05 ENCOUNTER — Ambulatory Visit: Payer: Medicare Other | Admitting: Physical Therapy

## 2015-10-04 ENCOUNTER — Encounter: Payer: Self-pay | Admitting: *Deleted

## 2015-10-04 ENCOUNTER — Other Ambulatory Visit
Admission: RE | Admit: 2015-10-04 | Discharge: 2015-10-04 | Disposition: A | Discharge status: Home / Self Care | Payer: Medicare Other | Source: Ambulatory Visit | Attending: Internal Medicine | Admitting: Internal Medicine

## 2015-10-04 DIAGNOSIS — N186 End stage renal disease: Secondary | ICD-10-CM | POA: Diagnosis present

## 2015-10-04 LAB — POTASSIUM: Potassium: 3.8 mmol/L (ref 3.5–5.1)

## 2015-10-07 ENCOUNTER — Encounter
Admission: RE | Disposition: A | Discharge status: Home / Self Care | Payer: Self-pay | Source: Ambulatory Visit | Attending: Vascular Surgery

## 2015-10-07 ENCOUNTER — Ambulatory Visit
Admission: RE | Admit: 2015-10-07 | Discharge: 2015-10-07 | Disposition: A | Discharge status: Home / Self Care | Payer: Medicare Other | Source: Ambulatory Visit | Attending: Vascular Surgery | Admitting: Vascular Surgery

## 2015-10-07 ENCOUNTER — Encounter: Payer: Self-pay | Admitting: *Deleted

## 2015-10-07 DIAGNOSIS — I509 Heart failure, unspecified: Secondary | ICD-10-CM | POA: Diagnosis not present

## 2015-10-07 DIAGNOSIS — F17200 Nicotine dependence, unspecified, uncomplicated: Secondary | ICD-10-CM | POA: Diagnosis not present

## 2015-10-07 DIAGNOSIS — Y832 Surgical operation with anastomosis, bypass or graft as the cause of abnormal reaction of the patient, or of later complication, without mention of misadventure at the time of the procedure: Secondary | ICD-10-CM | POA: Insufficient documentation

## 2015-10-07 DIAGNOSIS — I120 Hypertensive chronic kidney disease with stage 5 chronic kidney disease or end stage renal disease: Secondary | ICD-10-CM | POA: Diagnosis not present

## 2015-10-07 DIAGNOSIS — N186 End stage renal disease: Secondary | ICD-10-CM | POA: Diagnosis not present

## 2015-10-07 DIAGNOSIS — T82858A Stenosis of vascular prosthetic devices, implants and grafts, initial encounter: Secondary | ICD-10-CM | POA: Diagnosis present

## 2015-10-07 DIAGNOSIS — J449 Chronic obstructive pulmonary disease, unspecified: Secondary | ICD-10-CM | POA: Diagnosis not present

## 2015-10-07 DIAGNOSIS — M1990 Unspecified osteoarthritis, unspecified site: Secondary | ICD-10-CM | POA: Insufficient documentation

## 2015-10-07 DIAGNOSIS — Z886 Allergy status to analgesic agent status: Secondary | ICD-10-CM | POA: Diagnosis not present

## 2015-10-07 DIAGNOSIS — Z992 Dependence on renal dialysis: Secondary | ICD-10-CM | POA: Insufficient documentation

## 2015-10-07 DIAGNOSIS — E1122 Type 2 diabetes mellitus with diabetic chronic kidney disease: Secondary | ICD-10-CM | POA: Insufficient documentation

## 2015-10-07 DIAGNOSIS — F419 Anxiety disorder, unspecified: Secondary | ICD-10-CM | POA: Diagnosis not present

## 2015-10-07 DIAGNOSIS — E7800 Pure hypercholesterolemia, unspecified: Secondary | ICD-10-CM | POA: Insufficient documentation

## 2015-10-07 DIAGNOSIS — J439 Emphysema, unspecified: Secondary | ICD-10-CM | POA: Diagnosis not present

## 2015-10-07 DIAGNOSIS — F329 Major depressive disorder, single episode, unspecified: Secondary | ICD-10-CM | POA: Diagnosis not present

## 2015-10-07 DIAGNOSIS — D649 Anemia, unspecified: Secondary | ICD-10-CM | POA: Diagnosis not present

## 2015-10-07 DIAGNOSIS — K759 Inflammatory liver disease, unspecified: Secondary | ICD-10-CM | POA: Insufficient documentation

## 2015-10-07 HISTORY — DX: Pure hypercholesterolemia, unspecified: E78.00

## 2015-10-07 HISTORY — PX: PERIPHERAL VASCULAR CATHETERIZATION: SHX172C

## 2015-10-07 HISTORY — DX: Nicotine dependence, unspecified, uncomplicated: F17.200

## 2015-10-07 LAB — POTASSIUM (ARMC VASCULAR LAB ONLY): Potassium (ARMC vascular lab): 4.9

## 2015-10-07 SURGERY — A/V SHUNTOGRAM/FISTULAGRAM
Anesthesia: Moderate Sedation

## 2015-10-07 MED ORDER — FENTANYL CITRATE (PF) 100 MCG/2ML IJ SOLN
INTRAMUSCULAR | Status: DC | PRN
  Administered 2015-10-07 (×4): 50 ug via INTRAVENOUS

## 2015-10-07 MED ORDER — IOHEXOL 300 MG/ML  SOLN
INTRAMUSCULAR | Status: DC | PRN
  Administered 2015-10-07: 35 mL via INTRAVENOUS

## 2015-10-07 MED ORDER — FENTANYL CITRATE (PF) 100 MCG/2ML IJ SOLN
INTRAMUSCULAR | Status: AC
  Filled 2015-10-07: qty 2

## 2015-10-07 MED ORDER — CEFAZOLIN SODIUM 1-5 GM-% IV SOLN
INTRAVENOUS | Status: AC
  Filled 2015-10-07: qty 50

## 2015-10-07 MED ORDER — HEPARIN SODIUM (PORCINE) 1000 UNIT/ML IJ SOLN
INTRAMUSCULAR | Status: DC | PRN
  Administered 2015-10-07: 3000 [IU] via INTRAVENOUS

## 2015-10-07 MED ORDER — MIDAZOLAM HCL 2 MG/2ML IJ SOLN
INTRAMUSCULAR | Status: DC | PRN
  Administered 2015-10-07 (×2): 1 mg via INTRAVENOUS

## 2015-10-07 MED ORDER — HYDROMORPHONE HCL 1 MG/ML IJ SOLN: 1.0000 mg | INTRAMUSCULAR | Status: DC | PRN

## 2015-10-07 MED ORDER — LIDOCAINE-EPINEPHRINE (PF) 1 %-1:200000 IJ SOLN
INTRAMUSCULAR | Status: AC
  Filled 2015-10-07: qty 30

## 2015-10-07 MED ORDER — HEPARIN (PORCINE) IN NACL 2-0.9 UNIT/ML-% IJ SOLN
INTRAMUSCULAR | Status: AC
  Filled 2015-10-07: qty 1000

## 2015-10-07 MED ORDER — DEXTROSE 50 % IV SOLN: 0.5000 | Freq: Once | INTRAVENOUS | Status: DC | PRN

## 2015-10-07 MED ORDER — CEFAZOLIN SODIUM 1-5 GM-% IV SOLN: 1.0000 g | Freq: Once | INTRAVENOUS | Status: DC

## 2015-10-07 MED ORDER — MIDAZOLAM HCL 5 MG/5ML IJ SOLN
INTRAMUSCULAR | Status: AC
  Filled 2015-10-07: qty 5

## 2015-10-07 MED ORDER — ONDANSETRON HCL 4 MG/2ML IJ SOLN: 4.0000 mg | INTRAMUSCULAR | Status: DC | PRN

## 2015-10-07 MED ORDER — HEPARIN SODIUM (PORCINE) 1000 UNIT/ML IJ SOLN
INTRAMUSCULAR | Status: AC
Start: 2015-10-07 — End: 2015-10-07
  Filled 2015-10-07: qty 1

## 2015-10-07 MED ORDER — ATROPINE SULFATE 0.1 MG/ML IJ SOLN: 0.5000 mg | Freq: Once | INTRAMUSCULAR | Status: DC | PRN

## 2015-10-07 MED ORDER — SODIUM CHLORIDE 0.9 % IV SOLN: INTRAVENOUS | Status: DC

## 2015-10-07 MED ORDER — HEPARIN SODIUM (PORCINE) 10000 UNIT/ML IJ SOLN
INTRAMUSCULAR | Status: AC
  Filled 2015-10-07: qty 1

## 2015-10-07 SURGICAL SUPPLY — 17 items
BALLN DORADO 8X60X80 (BALLOONS) ×5
BALLN LUTONIX DCB 7X60X130 (BALLOONS) ×10
BALLOON DORADO 8X60X80 (BALLOONS) ×3 IMPLANT
BALLOON LUTONIX DCB 7X60X130 (BALLOONS) ×6 IMPLANT
BIOPATCH RED 1 DISK 7.0 (GAUZE/BANDAGES/DRESSINGS) ×4 IMPLANT
BIOPATCH RED 1IN DISK 7.0MM (GAUZE/BANDAGES/DRESSINGS) ×1
CANNULA 5F STIFF (CANNULA) ×5 IMPLANT
CATH PALINDROME-P 19CM W/VT (CATHETERS) ×5 IMPLANT
DEVICE PRESTO INFLATION (MISCELLANEOUS) ×5 IMPLANT
DRAPE BRACHIAL (DRAPES) ×5 IMPLANT
GUIDEWIRE SUPER STIFF .035X180 (WIRE) ×5 IMPLANT
PACK ANGIOGRAPHY (CUSTOM PROCEDURE TRAY) ×5 IMPLANT
PREP CHG 10.5 TEAL (MISCELLANEOUS) ×10 IMPLANT
SHEATH BRITE TIP 6FRX5.5 (SHEATH) ×10 IMPLANT
SUT MNCRL AB 4-0 PS2 18 (SUTURE) ×5 IMPLANT
SUT PROLENE 0 CT 1 30 (SUTURE) ×5 IMPLANT
WIRE MAGIC TORQUE 260C (WIRE) ×5 IMPLANT

## 2015-10-07 NOTE — Discharge Instructions (Signed)

## 2015-10-07 NOTE — Op Note (Signed)
[facility]    OPERATIVE NOTE   PROCEDURE: 1.    Left brachiocephalic arteriovenous fistula cannulation under ultrasound guidance 2.    left arm fistulagram including central venogram 3.    Percutaneous transluminal angioplasty of cephalic vein subclavian vein confluence with 7 mm diameter by 6 cm length Lutonix drug-coated angioplasty balloon 4.    Percutaneous transluminal angioplasty of the cephalic vein in the mid upper arm at the access site with 7 mm diameter by 6 cm length Lutonix drug-coated angioplasty balloon and 8 mm diameter by 6 cm length high pressure angioplasty balloon  PRE-OPERATIVE DIAGNOSIS: 1. ESRD 2. Poorly functional  Left brachiocephalic AVF  POST-OPERATIVE DIAGNOSIS: same as above   SURGEON: Leotis Pain, MD  ANESTHESIA: local with MCS  ESTIMATED BLOOD LOSS:  25 cc  FINDING(S): 1.  napkin ring like stenosis of about 75-88% at the cephalic vein subclavian vein confluence, 75-80% stenosis in the mid upper arm cephalic vein at what was likely the venous access site. Both improved with angioplasty  SPECIMEN(S):  None  CONTRAST:  35 cc  INDICATIONS: [name] is a 49 y.o. female who presents with malfunctioning  Left brachiocephalic arteriovenous fistula.  The patient is scheduled for left arm fistulagram.  The patient is aware the risks include but are not limited to: bleeding, infection, thrombosis of the cannulated access, and possible anaphylactic reaction to the contrast.  The patient is aware of the risks of the procedure and elects to proceed forward.  DESCRIPTION: After full informed written consent was obtained, the patient was brought back to the angiography suite and placed supine upon the angiography table.  The patient was connected to monitoring equipment.  The left arm was prepped and draped in the standard fashion for a percutaneous access intervention.  Under ultrasound guidance, the initial portion of the left  brachiocephalic arteriovenous fistula was cannulated with a micropuncture needle under direct ultrasound guidance in an antegrade fashion and a permanent image was performed.  The microwire was advanced into the fistula and the needle was exchanged for the a microsheath.  I then upsized to a 6 Fr Sheath and imaging was performed.  Hand injections were completed to image the access including the central venous system. This demonstrated  A napkin ring like stenosis at the cephalic vein subclavian vein confluence of about 70-75% as well as a stenosis just as bad if not a little worse in the 75-80% range in the mid upper arm cephalic vein at what was likely the venous access site.  Based on the images, this patient will need intervention to these 2 areas to improve the fistula function. I then gave the patient 3000 units of intravenous heparin.  I then crossed the stenoses with a Magic Tourqe wire.  Based on the imaging, a  7 mm x  6 cm   Lutonix drug-coated angioplasty balloon was selected.  The balloon was centered around the cephalic vein subclavian vein confluence stenosis and inflated to  10 ATM for 1 minute(s).  On completion imaging, a 20-25% residual stenosis was present.    I then turned my attention to the mid upper arm cephalic vein stenosis. I initially used a new 7 mm diameter by 6 cm length Lutonix drug-coated angioplasty balloon and inflated this to 12 atm for 1 minute. This was slightly undersized and greater than 50% residual stenosis appeared to be present after the initial angioplasty. I then upsized to an 8 mm diameter by 6 cm  length high pressure angioplasty balloon. I inflated this to 20 atm for 1 minute. Completion angiogram following this showed about a 25% residual stenosis which was not flow limiting.   Based on the completion imaging, no further intervention is necessary.  The wire and balloon were removed from the sheath.  A 4-0 Monocryl purse-string suture was sewn around the sheath.   The sheath was removed while tying down the suture.  A sterile bandage was applied to the puncture site.  The catheter exchanged for the poorly functioning catheter will be dictated separately  COMPLICATIONS: None  CONDITION: Stable   [name]  10/07/2015 4:27 PM

## 2015-10-07 NOTE — Op Note (Signed)
OPERATIVE NOTE    PRE-OPERATIVE DIAGNOSIS: 1. ESRD 2. Non-functional permcath  POST-OPERATIVE DIAGNOSIS: same as above  PROCEDURE: 1. Fluoroscopic guidance for placement of catheter 2. Placement of a 19 cm tip to cuff tunneled hemodialysis catheter via the right internal jugular vein and removal or previous catheter  SURGEON: DEW,JASON, MD  ANESTHESIA:  Local/MCS  ESTIMATED BLOOD LOSS: minimal cc  FINDING(S): none  SPECIMEN(S):  None  INDICATIONS:   Patient is a 49 y.o.female who presents with non-functional dialysis catheter and ESRD.  The patient needs long term dialysis access for their ESRD, and a Permcath is necessary.  Risks and benefits are discussed and informed consent is obtained.    DESCRIPTION: After obtaining full informed written consent, the patient was brought back to the vascular suited. The patient's existing catheter, neck and chest were sterilely prepped and draped in a sterile surgical field was created.  The existing catheter was dissected free from the fibrous sheath securing the cuff with hemostats and blunt dissection.  A wire was placed. The existing catheter was then removed and the wire used to keep venous access. I selected a 19 cm tip to cuff tunneled dialysis catheter.  Using fluoroscopic guidance the catheter tips were parked in the right atrium. The appropriate distal connectors were placed. It withdrew blood well and flushed easily with heparinized saline and a concentrated heparin solution was then placed. It was secured to the chest wall with 2 Prolene sutures. A 4-0 Monocryl pursestring suture was placed around the exit site. Sterile dressings were placed. The patient tolerated the procedure well and was taken to the recovery room in stable condition.  COMPLICATIONS: None  CONDITION: Stable  DEW,JASON 10/07/2015 4:32 PM

## 2015-10-07 NOTE — H&P (Signed)
[facility] Admission History & Physical  MRN : [id number]  [name] is a 49 y.o. (1966/01/11) female who presents with chief complaint of No chief complaint on file. Marland Kitchen  History of Present Illness: Patient is sent from her dialysis center for difficulties with her dialysis access. Her AV fistula has not been reliably used access, and despite previous interventions, she has remained catheter dependent. At this point, her catheter is also not working well and the dialysis center has requested this be exchanged as it is running very sluggishly. It has been present for many months. She has no other complaints today.  No current facility-administered medications for this encounter.    Past Medical History  Diagnosis Date  . Hypertension   . CHF (congestive heart failure) ([place])   . COPD (chronic obstructive pulmonary disease) ([place])   . Diabetes mellitus without complication ([place])   . Depression   . Anxiety   . Chronic kidney disease   . Arthritis   . Anemia   . Hepatitis   . Tobacco dependence   . Emphysema of lung ([place])   . Hypercholesterolemia     Past Surgical History  Procedure Laterality Date  . Cyst removed  from rt hand    . Tonsillectomy    . Appendectomy    . Rt. tubal and ovary removed    . Cholecystectomy    . Peripheral vascular catheterization N/A 07/25/2015    Procedure: A/V Shuntogram/Fistulagram;  Surgeon: Algernon Huxley, MD;  Location: [place] [facility];  Service: Cardiovascular;  Laterality: N/A;  . Peripheral vascular catheterization Left 07/25/2015    Procedure: A/V Shunt Intervention;  Surgeon: Algernon Huxley, MD;  Location: [place] [facility];  Service: Cardiovascular;  Laterality: Left;    Social History Social History  Substance Use Topics  . Smoking status: Current Every Day Smoker -- 0.50 packs/day for 17 years    Types: Cigarettes  . Smokeless tobacco: None  . Alcohol Use: No  No IVDU  Family History No bleeding  disorders, clotting disorders, or autoimmune diseases  Allergies  Allergen Reactions  . Tylenol [Acetaminophen] Anaphylaxis     REVIEW OF SYSTEMS (Negative unless checked)  Constitutional: [] Weight loss  [] Fever  [] Chills Cardiac: [] Chest pain   [] Chest pressure   [] Palpitations   [] Shortness of breath when laying flat   [] Shortness of breath at rest   [] Shortness of breath with exertion. Vascular:  [] Pain in legs with walking   [] Pain in legs at rest   [] Pain in legs when laying flat   [] Claudication   [] Pain in feet when walking  [] Pain in feet at rest  [] Pain in feet when laying flat   [] History of DVT   [] Phlebitis   [] Swelling in legs   [] Varicose veins   [] Non-healing ulcers Pulmonary:   [] Uses home oxygen   [] Productive cough   [] Hemoptysis   [] Wheeze  [] COPD   [] Asthma Neurologic:  [] Dizziness  [] Blackouts   [] Seizures   [] History of stroke   [] History of TIA  [] Aphasia   [] Temporary blindness   [] Dysphagia   [] Weakness or numbness in arms   [] Weakness or numbness in legs Musculoskeletal:  [] Arthritis   [] Joint swelling   [] Joint pain   [] Low back pain Hematologic:  [] Easy bruising  [] Easy bleeding   [] Hypercoagulable state   [] Anemic  [] Hepatitis Gastrointestinal:  [] Blood in stool   [] Vomiting blood  [] Gastroesophageal reflux/heartburn   [] Difficulty swallowing. Genitourinary:  [x] Chronic kidney  disease   [] Difficult urination  [] Frequent urination  [] Burning with urination   [] Blood in urine Skin:  [] Rashes   [] Ulcers   [] Wounds Psychological:  [] History of anxiety   []  History of major depression.  Physical Examination  Filed Vitals:   10/07/15 1314  BP: 177/82  Pulse: 72  Temp: 98.9 F (37.2 C)  Resp: 16  Height: 5\' 5"  (1.651 m)  Weight: 88.451 kg (195 lb)  SpO2: 98%   Body mass index is 32.45 kg/(m^2). Gen: WD/WN, NAD Head: [place]/AT, No temporalis wasting. Prominent temp pulse not noted. Ear/Nose/Throat: Hearing grossly intact, nares w/o erythema or drainage,  oropharynx w/o Erythema/Exudate,  Eyes: PERRLA, EOMI.  Neck: Supple, no nuchal rigidity.  No JVD.  Pulmonary:  Good air movement, no use of accessory muscles.  Cardiac: RRR, normal S1, S2, no Murmurs, rubs or gallops. Vascular: thrill present in AVF, catheter in place Vessel Right Left  Radial Palpable Palpable                                   Gastrointestinal: soft, non-tender/non-distended. No guarding/reflex.  Musculoskeletal: M/S 5/5 throughout.  Extremities without ischemic changes.  No deformity or atrophy.  Neurologic: CN 2-12 intact. Pain and light touch intact in extremities.  Symmetrical.  Speech is fluent. Motor exam as listed above. Psychiatric: Judgment intact, Mood & affect appropriate for pt's clinical situation. Dermatologic: No rashes or ulcers noted.  No cellulitis or open wounds. Lymph : No Cervical, Axillary, or Inguinal lymphadenopathy.    CBC Lab Results  Component Value Date   WBC 9.2 10/29/2014   HGB 7.6* 10/29/2014   HCT 23.0* 10/29/2014   MCV 87 10/29/2014   PLT 134* 10/29/2014    BMET    Component Value Date/Time   NA 133* 10/31/2014 0955   K 3.8 10/04/2015 1306   K 4.7 10/31/2014 0955   CL 102 10/31/2014 0955   CO2 27 10/31/2014 0955   GLUCOSE 203* 10/31/2014 0955   BUN 35* 10/31/2014 0955   CREATININE 2.80* 10/31/2014 0955   CALCIUM 7.7* 10/31/2014 0955   GFRNONAA 20* 10/29/2014 0429   GFRNONAA 34* 06/02/2013 1405   GFRAA 24* 10/29/2014 0429   GFRAA 39* 06/02/2013 1405   CrCl cannot be calculated (Patient has no serum creatinine result on file.).  COAG Lab Results  Component Value Date   INR 1.1 10/15/2014   INR 0.9 12/29/2012    Radiology No results found.   Assessment/Plan 1. ESRD. Having difficulties with access. 2. Dysfunction of dialysis access. Fistula has not been reliably usable. Catheter has still been necessary to be used and it is now not running well and needs to be exchanged. We'll try to get her fistula  better, but will also exchanged for catheter for a more usable device. 3. Hypertension. Stable. Continue outpatient medicines 4. Diabetes. Stable. Continue outpatient medicines   Shanae Luo, MD  10/07/2015 1:28 PM

## 2015-10-08 ENCOUNTER — Encounter: Payer: Self-pay | Admitting: Vascular Surgery

## 2015-10-11 ENCOUNTER — Emergency Department
Admission: EM | Admit: 2015-10-11 | Discharge: 2015-10-12 | Disposition: A | Discharge status: Home / Self Care | Payer: Medicare Other | Attending: Emergency Medicine | Admitting: Emergency Medicine

## 2015-10-11 ENCOUNTER — Encounter: Payer: Self-pay | Admitting: Emergency Medicine

## 2015-10-11 ENCOUNTER — Other Ambulatory Visit: Payer: Self-pay

## 2015-10-11 DIAGNOSIS — I120 Hypertensive chronic kidney disease with stage 5 chronic kidney disease or end stage renal disease: Secondary | ICD-10-CM | POA: Insufficient documentation

## 2015-10-11 DIAGNOSIS — F329 Major depressive disorder, single episode, unspecified: Secondary | ICD-10-CM | POA: Insufficient documentation

## 2015-10-11 DIAGNOSIS — Y998 Other external cause status: Secondary | ICD-10-CM | POA: Diagnosis not present

## 2015-10-11 DIAGNOSIS — T450X2A Poisoning by antiallergic and antiemetic drugs, intentional self-harm, initial encounter: Secondary | ICD-10-CM | POA: Insufficient documentation

## 2015-10-11 DIAGNOSIS — Y9389 Activity, other specified: Secondary | ICD-10-CM | POA: Insufficient documentation

## 2015-10-11 DIAGNOSIS — Z792 Long term (current) use of antibiotics: Secondary | ICD-10-CM | POA: Diagnosis not present

## 2015-10-11 DIAGNOSIS — Z720 Tobacco use: Secondary | ICD-10-CM | POA: Diagnosis not present

## 2015-10-11 DIAGNOSIS — Y9289 Other specified places as the place of occurrence of the external cause: Secondary | ICD-10-CM | POA: Insufficient documentation

## 2015-10-11 DIAGNOSIS — N186 End stage renal disease: Secondary | ICD-10-CM

## 2015-10-11 DIAGNOSIS — E119 Type 2 diabetes mellitus without complications: Secondary | ICD-10-CM | POA: Diagnosis not present

## 2015-10-11 DIAGNOSIS — Z79899 Other long term (current) drug therapy: Secondary | ICD-10-CM | POA: Insufficient documentation

## 2015-10-11 DIAGNOSIS — T50902A Poisoning by unspecified drugs, medicaments and biological substances, intentional self-harm, initial encounter: Secondary | ICD-10-CM

## 2015-10-11 LAB — COMPREHENSIVE METABOLIC PANEL
ALT: 8 U/L — AB (ref 14–54)
ANION GAP: 9 (ref 5–15)
AST: 17 U/L (ref 15–41)
Albumin: 3 g/dL — ABNORMAL LOW (ref 3.5–5.0)
Alkaline Phosphatase: 57 U/L (ref 38–126)
BUN: 34 mg/dL — AB (ref 6–20)
CALCIUM: 8 mg/dL — AB (ref 8.9–10.3)
CO2: 23 mmol/L (ref 22–32)
Chloride: 105 mmol/L (ref 101–111)
Creatinine, Ser: 4.62 mg/dL — ABNORMAL HIGH (ref 0.44–1.00)
GFR, EST AFRICAN AMERICAN: 12 mL/min — AB (ref >60)
GFR, EST NON AFRICAN AMERICAN: 10 mL/min — AB (ref >60)
GLUCOSE: 121 mg/dL — AB (ref 65–99)
POTASSIUM: 4 mmol/L (ref 3.5–5.1)
SODIUM: 137 mmol/L (ref 135–145)
TOTAL PROTEIN: 7 g/dL (ref 6.5–8.1)
Total Bilirubin: <0.1 mg/dL — ABNORMAL LOW (ref 0.3–1.2)

## 2015-10-11 LAB — CBC
HCT: 29.9 % — ABNORMAL LOW (ref 35.0–47.0)
Hemoglobin: 9.6 g/dL — ABNORMAL LOW (ref 12.0–16.0)
MCH: 26.3 pg (ref 26.0–34.0)
MCHC: 32.1 g/dL (ref 32.0–36.0)
MCV: 82 fL (ref 80.0–100.0)
PLATELETS: 149 10*3/uL — AB (ref 150–440)
RBC: 3.65 MIL/uL — ABNORMAL LOW (ref 3.80–5.20)
RDW: 21.9 % — AB (ref 11.5–14.5)
WBC: 6 10*3/uL (ref 3.6–11.0)

## 2015-10-11 LAB — ACETAMINOPHEN LEVEL

## 2015-10-11 LAB — URINE DRUG SCREEN, QUALITATIVE (ARMC ONLY)
Amphetamines, Ur Screen: NOT DETECTED
BARBITURATES, UR SCREEN: NOT DETECTED
BENZODIAZEPINE, UR SCRN: POSITIVE — AB
CANNABINOID 50 NG, UR ~~LOC~~: NOT DETECTED
Cocaine Metabolite,Ur ~~LOC~~: NOT DETECTED
MDMA (Ecstasy)Ur Screen: NOT DETECTED
METHADONE SCREEN, URINE: NOT DETECTED
OPIATE, UR SCREEN: NOT DETECTED
Phencyclidine (PCP) Ur S: NOT DETECTED
TRICYCLIC, UR SCREEN: POSITIVE — AB

## 2015-10-11 LAB — GLUCOSE, CAPILLARY
GLUCOSE-CAPILLARY: 72 mg/dL (ref 65–99)
Glucose-Capillary: 134 mg/dL — ABNORMAL HIGH (ref 65–99)
Glucose-Capillary: 97 mg/dL (ref 65–99)

## 2015-10-11 LAB — ETHANOL

## 2015-10-11 LAB — SALICYLATE LEVEL: Salicylate Lvl: <4 mg/dL (ref 2.8–30.0)

## 2015-10-11 MED ORDER — SODIUM CHLORIDE 0.9 % IV BOLUS (SEPSIS): 1000.0000 mL | Freq: Once | INTRAVENOUS | Status: DC

## 2015-10-11 MED ORDER — HEPARIN SODIUM (PORCINE) 1000 UNIT/ML IJ SOLN
1000.0000 [IU] | Freq: Once | INTRAMUSCULAR | Status: AC
  Administered 2015-10-11: 3200 [IU]

## 2015-10-11 NOTE — ED Notes (Signed)
BEHAVIORAL HEALTH ROUNDING Patient sleeping: No. Patient alert and oriented: yes Behavior appropriate: Yes.  ;  Nutrition and fluids offered: Yes  Toileting and hygiene offered: Yes  Sitter present: yes Law enforcement present: Yes  

## 2015-10-11 NOTE — ED Notes (Signed)
Pt denies SI/HI, states "I never wanted to hurt myself in the first place, I just wanted to go to sleep and make it stop".

## 2015-10-11 NOTE — Progress Notes (Signed)
Post hd tx 

## 2015-10-11 NOTE — ED Provider Notes (Signed)
Patient is currently medically stable. She was dialyzed successfully. She still pending psychiatric disposition.  [name], MD 10/11/15 [phone]

## 2015-10-11 NOTE — ED Notes (Addendum)
Pt brought to ER by [facility] from home. Per EMS pt took intentional OD of 25 hydroxzine 25mg  and 5 amitriptyline 10 mg between 0430 and 0500 this morning. Pt states +SI, -HI, -AH, -VH, and -paranoia. Per pt "I have no quality of life and no caregiver because he only comes home to have me wash his clothes, cook his meals and goes back to her house to sleep at night". Pt reprots that she takes Dialysis pt with dialysis on MWF. Pt denies ETOH use, but does smoke about 1 pack ogf cigarettes per day VS per EMS 149/80, HR 70's, BG 104 Per EMS no widening of QRS on 12 lead EKG

## 2015-10-11 NOTE — ED Notes (Addendum)
BEHAVIORAL HEALTH ROUNDING Patient sleeping: No. Patient alert and oriented: yes Behavior appropriate: Yes.  ;  Nutrition and fluids offered: Yes  Toileting and hygiene offered: Yes  Sitter present: yes Law enforcement present: Yes  

## 2015-10-11 NOTE — ED Provider Notes (Signed)
[facility] Emergency Department Provider Note  ____________________________________________  Time seen: 6:25 AM  I have reviewed the triage vital signs and the nursing notes.   HISTORY  Chief Complaint No chief complaint on file.      HPI [name] is a 49 y.o. female presents with history of intentional overdose suicide attempt at 4:30 AM this morning. Patient states that she took approximately 20 tablets of hydroxyzine 25 mg tablets and 5 amitriptyline 10 mg tablets. Patient admits to history of depression with recent familial stress namely separation from her significant other.    Past Medical History  Diagnosis Date  . Hypertension   . CHF (congestive heart failure) ([place])   . COPD (chronic obstructive pulmonary disease) ([place])   . Diabetes mellitus without complication ([place])   . Depression   . Anxiety   . Chronic kidney disease   . Arthritis   . Anemia   . Hepatitis   . Tobacco dependence   . Emphysema of lung ([place])   . Hypercholesterolemia     There are no active problems to display for this patient.   Past Surgical History  Procedure Laterality Date  . Cyst removed  from rt hand    . Tonsillectomy    . Appendectomy    . Rt. tubal and ovary removed    . Cholecystectomy    . Peripheral vascular catheterization N/A 07/25/2015    Procedure: A/V Shuntogram/Fistulagram;  Surgeon: Algernon Huxley, MD;  Location: [place] [facility];  Service: Cardiovascular;  Laterality: N/A;  . Peripheral vascular catheterization Left 07/25/2015    Procedure: A/V Shunt Intervention;  Surgeon: Algernon Huxley, MD;  Location: [place] [facility];  Service: Cardiovascular;  Laterality: Left;  . Peripheral vascular catheterization Left 10/07/2015    Procedure: A/V Shuntogram/Fistulagram;  Surgeon: Algernon Huxley, MD;  Location: [place] [facility];  Service: Cardiovascular;  Laterality: Left;  . Peripheral vascular catheterization N/A 10/07/2015    Procedure: A/V  Shunt Intervention;  Surgeon: Algernon Huxley, MD;  Location: [place] [facility];  Service: Cardiovascular;  Laterality: N/A;  . Peripheral vascular catheterization  10/07/2015    Procedure: Dialysis/Perma Catheter Insertion;  Surgeon: Algernon Huxley, MD;  Location: [place] CV LAB;  Service: Cardiovascular;;    Current Outpatient Rx  Name  Route  Sig  Dispense  Refill  . amitriptyline (ELAVIL) 10 MG tablet   Oral   Take by mouth at bedtime.      2   . B-D INS SYRINGE 0.5CC/30GX1/2" 30G X 1/2" 0.5 ML MISC      USE AS DIRECTED FOR INSULIN      3     Dispense as written.   . calcium acetate (PHOSLO) 667 MG capsule      TAKE 2 CAPSULES BY MOUTH WITH EACH MEAL      5   . CVS STOOL SOFTENER 100 MG capsule      TAKE ONE CAPSULE BY MOUTH TWICE A DAY AS NEEDED FOR CONSTIPATION      3     Dispense as written.   . diltiazem (CARDIZEM SR) 120 MG 12 hr capsule      TAKE ONE CAPSULE BY MOUTH EVERY 12 HOURS      6   . hydrALAZINE (APRESOLINE) 100 MG tablet   Oral   Take 100 mg by mouth every 8 (eight) hours.      3   . hydrOXYzine (ATARAX/VISTARIL) 25 MG tablet  Oral   Take 25 mg by mouth every 6 (six) hours as needed.      0   . metoprolol tartrate (LOPRESSOR) 25 MG tablet   Oral   Take 25 mg by mouth 2 (two) times daily.      3   . NOVOLIN R 100 UNIT/ML injection      INJECT 5-10 UNITS SUBCUTANEOUSLY TWICE A DAY WITH MEAL PER SLIDING SCALE      2     Dispense as written.   Marland Kitchen omeprazole (PRILOSEC) 20 MG capsule   Oral   Take 20 mg by mouth daily.      2   . Oxycodone HCl 10 MG TABS      TAKE 1 TABLET BY MOUTH 3 TIMES A DAY FOR PAIN      0   . sulfamethoxazole-trimethoprim (BACTRIM DS,SEPTRA DS) 800-160 MG per tablet   Oral   Take 1 tablet by mouth daily.      0   . venlafaxine XR (EFFEXOR-XR) 75 MG 24 hr capsule   Oral   Take 150 mg by mouth daily.      3     Allergies Tylenol  No family history on file.  Social History Social  History  Substance Use Topics  . Smoking status: Current Every Day Smoker -- 0.50 packs/day for 17 years    Types: Cigarettes  . Smokeless tobacco: Not on file  . Alcohol Use: No    Review of Systems  Constitutional: Negative for fever. Eyes: Negative for visual changes. ENT: Negative for sore throat. Cardiovascular: Negative for chest pain. Respiratory: Negative for shortness of breath. Gastrointestinal: Negative for abdominal pain, vomiting and diarrhea. Genitourinary: Negative for dysuria. Musculoskeletal: Negative for back pain. Skin: Negative for rash. Neurological: Negative for headaches, focal weakness or numbness. Psychiatric:Positive depression and suicide attempt  10-point ROS otherwise negative.  ____________________________________________   PHYSICAL EXAM:  VITAL SIGNS: ED Triage Vitals  Enc Vitals Group     BP --      Pulse --      Resp --      Temp --      Temp src --      SpO2 --      Weight --      Height --      Head Cir --      Peak Flow --      Pain Score --      Pain Loc --      Pain Edu? --      Excl. in [place]? --      Constitutional: Alert and oriented. Well appearing and in no distress.tearful Eyes: Conjunctivae are normal. PERRL. Normal extraocular movements. ENT   Head: Normocephalic and atraumatic.   Nose: No congestion/rhinnorhea.   Mouth/Throat: Mucous membranes are moist.   Neck: No stridor. Hematological/Lymphatic/Immunilogical: No cervical lymphadenopathy. Cardiovascular: Normal rate, regular rhythm. Normal and symmetric distal pulses are present in all extremities. No murmurs, rubs, or gallops. Respiratory: Normal respiratory effort without tachypnea nor retractions. Breath sounds are clear and equal bilaterally. No wheezes/rales/rhonchi. Gastrointestinal: Soft and nontender. No distention. There is no CVA tenderness. Genitourinary: deferred Musculoskeletal: Nontender with normal range of motion in all extremities.  No joint effusions.  No lower extremity tenderness nor edema. Neurologic:  Normal speech and language. No gross focal neurologic deficits are appreciated. Speech is normal.  Skin:  Skin is warm, dry and intact. No rash noted. Psychiatric: Positive for depression and suicidal ideation.  Speech and behavior are normal. Patient exhibits appropriate insight and judgment.  ____________________________________________    LABS (pertinent positives/negatives)  Labs Reviewed  COMPREHENSIVE METABOLIC PANEL - Abnormal; Notable for the following:    Glucose, Bld 121 (*)    BUN 34 (*)    Creatinine, Ser 4.62 (*)    Calcium 8.0 (*)    Albumin 3.0 (*)    ALT 8 (*)    Total Bilirubin <0.1 (*)    GFR calc non Af Amer 10 (*)    GFR calc Af Amer 12 (*)    All other components within normal limits  ACETAMINOPHEN LEVEL - Abnormal; Notable for the following:    Acetaminophen (Tylenol), Serum <10 (*)    All other components within normal limits  CBC - Abnormal; Notable for the following:    RBC 3.65 (*)    Hemoglobin 9.6 (*)    HCT 29.9 (*)    RDW 21.9 (*)    Platelets 149 (*)    All other components within normal limits  GLUCOSE, CAPILLARY - Abnormal; Notable for the following:    Glucose-Capillary 134 (*)    All other components within normal limits  URINE DRUG SCREEN, QUALITATIVE ([facility] ONLY) - Abnormal; Notable for the following:    Tricyclic, Ur Screen POSITIVE (*)    Benzodiazepine, Ur Scrn POSITIVE (*)    All other components within normal limits  GLUCOSE, CAPILLARY - Abnormal; Notable for the following:    Glucose-Capillary 55 (*)    All other components within normal limits  GLUCOSE, CAPILLARY - Abnormal; Notable for the following:    Glucose-Capillary 118 (*)    All other components within normal limits  GLUCOSE, CAPILLARY - Abnormal; Notable for the following:    Glucose-Capillary 210 (*)    All other components within normal limits  GLUCOSE, CAPILLARY - Abnormal; Notable for the  following:    Glucose-Capillary 181 (*)    All other components within normal limits  GLUCOSE, CAPILLARY - Abnormal; Notable for the following:    Glucose-Capillary 150 (*)    All other components within normal limits  URINE CULTURE  ETHANOL  SALICYLATE LEVEL  HEPATITIS B SURFACE ANTIGEN  HEPATITIS B SURFACE ANTIBODY, QUANTITATIVE  GLUCOSE, CAPILLARY  GLUCOSE, CAPILLARY      ____________________________________________   EKG  ED ECG REPORT I, Jaevion Goto, [place], the attending physician, personally viewed and interpreted this ECG.   Date: 10/15/2015  EKG Time: 9:55 AM  Rate: 71  Rhythm: Normal sinus rhythm  Axis: None  Intervals: Normal  ST&T Change: None     INITIAL IMPRESSION / ASSESSMENT AND PLAN / ED COURSE  Pertinent labs & imaging results that were available during my care of the patient were reviewed by me and considered in my medical decision making (see chart for details).    ____________________________________________   FINAL CLINICAL IMPRESSION(S) / ED DIAGNOSES  Final diagnoses:  Overdose, intentional self-harm, initial encounter ([place])  End stage renal disease ([place])      Gregor Hams, MD 10/15/15 [phone]

## 2015-10-11 NOTE — Progress Notes (Signed)
  Subjective:   Patient is admitted to the ER under involuntary commitment for attempted suicide yesterday with hydroxyzine and amitriptyline. At present she is alert, oriented. No complaints of shortness of breath. She recently had declot of her fistula. Currently PermCath is being utilized for dialysis.  Objective:  Vital signs in last 24 hours:  Temp:  [98.3 F (36.8 C)] 98.3 F (36.8 C) (10/21 0625) Pulse Rate:  [67-72] 67 (10/21 1100) Resp:  [18] 18 (10/21 1100) BP: (127-151)/(59-84) 127/65 mmHg (10/21 1100) SpO2:  [94 %-96 %] 95 % (10/21 1100)  Weight change:  There were no vitals filed for this visit.  Intake/Output:   No intake or output data in the 24 hours ending 10/11/15 1200   Physical Exam: General:  no acute distress, laying in the bed   HEENT  anicteric, moist mucous membranes   Neck  supple, no masses   Pulm/lungs  normal respiratory effort, clear to auscultation   CVS/Heart  regular rhythm, no rub or gallop   Abdomen:   soft, nontender, nondistended   Extremities:  no peripheral edema   Neurologic:  alert, oriented   Skin:  no acute rashes   Access:  right IJ PermCath, left arm AV fistula with evidence of infiltration        Basic Metabolic Panel:   Recent Labs Lab 10/04/15 1306 10/11/15 0647  NA  --  137  K 3.8 4.0  CL  --  105  CO2  --  23  GLUCOSE  --  121*  BUN  --  34*  CREATININE  --  4.62*  CALCIUM  --  8.0*     CBC:  Recent Labs Lab 10/11/15 0647  WBC 6.0  HGB 9.6*  HCT 29.9*  MCV 82.0  PLT 149*      Microbiology:  No results found for this or any previous visit (from the past 720 hour(s)).  Coagulation Studies: No results for input(s): LABPROT, INR in the last 72 hours.  Urinalysis: No results for input(s): COLORURINE, LABSPEC, PHURINE, GLUCOSEU, HGBUR, BILIRUBINUR, KETONESUR, PROTEINUR, UROBILINOGEN, NITRITE, LEUKOCYTESUR in the last 72 hours.  Invalid input(s): APPERANCEUR    Imaging: No results  found.   Medications:       Assessment/ Plan:  49 y.o. female with medical history of hypertension, diastolic heart failure, diabetes mellitus type 2, and anemia of chronic kidney disease,  hepatitis C, depression, psoriasis, peripheral neuropathy, ESRD  1. End-stage renal disease. [employer] dialysis. Monday, Wednesday, Friday second shift.CCKA 2. Anemia of chronic kidney disease 3. Secondary hyperparathyroidism 4. Depression, suicide attempt. Psychiatry evaluation is pending. Plan: Dialysis today and continue schedule Low-dose Procrit with dialysis Monitor phosphorus    LOS:  Shemeka Wardle 10/21/201612:00 PM

## 2015-10-11 NOTE — ED Provider Notes (Signed)
-----------------------------------------   7:15 AM on 10/11/2015 -----------------------------------------  Patient is calm, resting comfortably at this time. Vital signs are normal. No distress. Continue to await psychiatric screening labs and continue to observe the patient closely in the emergency room.  ----------------------------------------- 7:38 AM on 10/11/2015 -----------------------------------------  Patient's labs note significant renal insufficiency, however able to review labs from outside hospital system and the patient's previous GFR on last check was 16. She does have chronic kidney disease, and is on 3 day weekly dialysis, due today. At this time she is fully awake and alert, no hypoxia, no trouble breathing, no evidence of acute volume overload. Her potassium is normal. I have placed a consult to nephrology to assist with her dialysis and renal needs while the patient is here at the hospital.  EKG reviewed and interpreted by me EKG time 7:32 AM Ventricular rate 70 QRS 75 QTc 488 No terminal R wave elevation, no QRS widening, no evidence of acute toxicity noted although the QT is borderline prolonged at 488. We will plan to repeat an EKG for the patient in one hour for QT monitoring.  Delman Kitten, MD 10/11/15 [phone]

## 2015-10-11 NOTE — ED Notes (Signed)
Report from [place], rn. Pt sleeping.

## 2015-10-11 NOTE — ED Notes (Signed)
This RN spoke with poison control

## 2015-10-11 NOTE — ED Notes (Signed)
Phone given to pt for incoming phone call, pt agreeable to take phone call

## 2015-10-11 NOTE — ED Notes (Signed)
Pt to be disconnected from cardiac monitoring and discontinue 1:1 sitter per MD order.

## 2015-10-11 NOTE — ED Notes (Signed)
Pt transported to dialysis with officer and 1:1 sitter.

## 2015-10-11 NOTE — ED Notes (Signed)
Spoke with dialysis nurse, not ready for pt to be transported down at the moment.
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
## 2015-10-11 NOTE — ED Notes (Addendum)
Pt had three visitors drive from [place] to see pt.  Pt and visitors made aware of visitation policies.  Visitors were allowed to come back one at a time for 10 minutes at this time.

## 2015-10-11 NOTE — ED Notes (Signed)
ENVIRONMENTAL ASSESSMENT Potentially harmful objects out of patient reach: No. Personal belongings secured: Yes.   Patient dressed in hospital provided attire only: Yes.   Plastic bags out of patient reach: Yes.   Patient care equipment (cords, cables, call bells, lines, and drains) shortened, removed, or accounted for: Yes.   Equipment and supplies removed from bottom of stretcher: Yes.   Potentially toxic materials out of patient reach: Yes.   Sharps container removed or out of patient reach: Yes.     Pt being cardiac monitored.  1:1 sitter at bedside with patient.

## 2015-10-12 DIAGNOSIS — T450X2A Poisoning by antiallergic and antiemetic drugs, intentional self-harm, initial encounter: Secondary | ICD-10-CM | POA: Diagnosis not present

## 2015-10-12 LAB — GLUCOSE, CAPILLARY
GLUCOSE-CAPILLARY: 210 mg/dL — AB (ref 65–99)
GLUCOSE-CAPILLARY: 181 mg/dL — AB (ref 65–99)
Glucose-Capillary: 118 mg/dL — ABNORMAL HIGH (ref 65–99)
Glucose-Capillary: 55 mg/dL — ABNORMAL LOW (ref 65–99)
Glucose-Capillary: 150 mg/dL — ABNORMAL HIGH (ref 65–99)

## 2015-10-12 LAB — HEPATITIS B SURFACE ANTIGEN: HEP B S AG: NEGATIVE

## 2015-10-12 LAB — HEPATITIS B SURFACE ANTIBODY, QUANTITATIVE: Hepatitis B-Post: 10.7 m[IU]/mL (ref >9.9)

## 2015-10-12 MED ORDER — AMITRIPTYLINE HCL 10 MG PO TABS
5.0000 mg | ORAL_TABLET | Freq: Every day | ORAL | Status: DC
  Filled 2015-10-12: qty 0.5

## 2015-10-12 MED ORDER — DULOXETINE HCL 60 MG PO CPEP: 60.0000 mg | ORAL_CAPSULE | Freq: Every day | ORAL | Status: DC

## 2015-10-12 MED ORDER — IRBESARTAN 150 MG PO TABS: 150.0000 mg | ORAL_TABLET | Freq: Every day | ORAL | Status: DC

## 2015-10-12 MED ORDER — METOPROLOL TARTRATE 25 MG PO TABS
25.0000 mg | ORAL_TABLET | Freq: Two times a day (BID) | ORAL | Status: DC
  Administered 2015-10-12: 25 mg via ORAL
  Filled 2015-10-12: qty 1

## 2015-10-12 MED ORDER — DILTIAZEM HCL ER COATED BEADS 120 MG PO CP24
120.0000 mg | ORAL_CAPSULE | Freq: Every day | ORAL | Status: DC
  Filled 2015-10-12: qty 1

## 2015-10-12 MED ORDER — HYDROXYZINE HCL 25 MG PO TABS: 50.0000 mg | ORAL_TABLET | Freq: Every day | ORAL | Status: DC

## 2015-10-12 MED ORDER — DULOXETINE HCL 60 MG PO CPEP
60.0000 mg | ORAL_CAPSULE | Freq: Every day | ORAL | Status: DC
  Administered 2015-10-12: 60 mg via ORAL
  Filled 2015-10-12: qty 1

## 2015-10-12 MED ORDER — DILTIAZEM HCL ER 60 MG PO CP12
120.0000 mg | ORAL_CAPSULE | Freq: Once | ORAL | Status: DC
  Filled 2015-10-12: qty 2

## 2015-10-12 MED ORDER — DILTIAZEM HCL ER COATED BEADS 120 MG PO CP24
120.0000 mg | ORAL_CAPSULE | Freq: Once | ORAL | Status: DC
  Filled 2015-10-12: qty 1

## 2015-10-12 MED ORDER — SEVELAMER CARBONATE 800 MG PO TABS
1600.0000 mg | ORAL_TABLET | Freq: Three times a day (TID) | ORAL | Status: DC
  Administered 2015-10-12 (×4): 1600 mg via ORAL
  Filled 2015-10-12 (×4): qty 2

## 2015-10-12 MED ORDER — INSULIN GLARGINE 100 UNIT/ML ~~LOC~~ SOLN
15.0000 [IU] | Freq: Every day | SUBCUTANEOUS | Status: DC
  Administered 2015-10-12: 15 [IU] via SUBCUTANEOUS
  Filled 2015-10-12 (×2): qty 0.15

## 2015-10-12 MED ORDER — DILTIAZEM HCL 30 MG PO TABS
60.0000 mg | ORAL_TABLET | Freq: Four times a day (QID) | ORAL | Status: DC
  Administered 2015-10-12 (×2): 60 mg via ORAL
  Filled 2015-10-12 (×2): qty 2

## 2015-10-12 MED ORDER — MOMETASONE FURO-FORMOTEROL FUM 100-5 MCG/ACT IN AERO
2.0000 | INHALATION_SPRAY | Freq: Two times a day (BID) | RESPIRATORY_TRACT | Status: DC
  Administered 2015-10-12: 2 via RESPIRATORY_TRACT
  Filled 2015-10-12: qty 8.8

## 2015-10-12 MED ORDER — HYDRALAZINE HCL 25 MG PO TABS
100.0000 mg | ORAL_TABLET | Freq: Once | ORAL | Status: AC
  Administered 2015-10-12: 100 mg via ORAL
  Filled 2015-10-12: qty 4

## 2015-10-12 MED ORDER — PANTOPRAZOLE SODIUM 20 MG PO TBEC: 20.0000 mg | DELAYED_RELEASE_TABLET | Freq: Every day | ORAL | Status: DC

## 2015-10-12 MED ORDER — VENLAFAXINE HCL ER 75 MG PO CP24
150.0000 mg | ORAL_CAPSULE | Freq: Every day | ORAL | Status: DC
  Administered 2015-10-12: 150 mg via ORAL
  Filled 2015-10-12: qty 1

## 2015-10-12 MED ORDER — HYDRALAZINE HCL 25 MG PO TABS
100.0000 mg | ORAL_TABLET | Freq: Two times a day (BID) | ORAL | Status: DC
Start: 2015-10-12 — End: 2015-10-12
  Administered 2015-10-12: 100 mg via ORAL
  Filled 2015-10-12: qty 4

## 2015-10-12 MED ORDER — INSULIN ASPART 100 UNIT/ML ~~LOC~~ SOLN: 0.0000 [IU] | Freq: Every day | SUBCUTANEOUS | Status: DC

## 2015-10-12 MED ORDER — METOPROLOL TARTRATE 25 MG PO TABS
25.0000 mg | ORAL_TABLET | Freq: Once | ORAL | Status: AC
  Administered 2015-10-12: 25 mg via ORAL
  Filled 2015-10-12: qty 1

## 2015-10-12 MED ORDER — RENA-VITE PO TABS
1.0000 | ORAL_TABLET | Freq: Every day | ORAL | Status: DC
  Filled 2015-10-12: qty 1

## 2015-10-12 MED ORDER — INSULIN ASPART 100 UNIT/ML ~~LOC~~ SOLN
0.0000 [IU] | Freq: Three times a day (TID) | SUBCUTANEOUS | Status: DC
  Administered 2015-10-12: 3 [IU] via SUBCUTANEOUS
  Administered 2015-10-12: 1 [IU] via SUBCUTANEOUS
  Administered 2015-10-12: 2 [IU] via SUBCUTANEOUS
  Filled 2015-10-12: qty 1
  Filled 2015-10-12: qty 2
  Filled 2015-10-12: qty 3
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
## 2015-10-12 NOTE — ED Provider Notes (Signed)
-----------------------------------------   4:19 PM on 10/12/2015 -----------------------------------------   Blood pressure 166/71, pulse 88, temperature 98.4 F (36.9 C), temperature source Oral, resp. rate 18, SpO2 97 %.  The patient had no acute events since last update.  Calm and cooperative at this time.  Case discussed with psychiatry Dr. Dillard Cannon in the ED at 3:30 PM. She reports the patient is psychiatrically stable and not a danger to herself or others. Dr. Dillard Cannon recommends starting the patient on Cymbalta 60 mg once daily and has ordered a dose for the patient here in the emergency department. We'll go ahead and provide a prescription for this medication and Dr. Lennox Grumbles recommendation. We'll give patient contact and for a follow-up with [facility] for continued evaluation. Patient remains medically stable in the emergency department and is suitable for outpatient follow-up and discharge.   Carrie Mew, MD 10/12/15 [phone]

## 2015-10-12 NOTE — ED Provider Notes (Signed)
-----------------------------------------   2:49 PM on 10/12/2015 -----------------------------------------   Blood pressure 166/71, pulse 88, temperature 98.4 F (36.9 C), temperature source Oral, resp. rate 18, SpO2 97 %.  The patient had no acute events since last update.  Calm and cooperative at this time.  Disposition is pending per Psychiatry/Behavioral Medicine team recommendations.  Dr. [name] of nephrology continuing to follow, did make some adjustments patient's medications today.     Delman Kitten, MD 10/12/15 [phone]

## 2015-10-12 NOTE — Consult Note (Signed)
[facility] Psychiatry Consult   Reason for Consult:  Follow up Referring Physician:  ER Patient Identification: MADORA BARLETTA MRN:  [id number] Principal Diagnosis: <principal problem not specified> Diagnosis:  There are no active problems to display for this patient.   Total Time spent with patient: 1 hour  Subjective:   PAULLETTE MCKAIN is a 49 y.o. female patient admitted with a long H/O depression and is bing followed by PCP for the same.  HPI:  Pt was depressed as her boy friend of over 52 yrs has a new girl friend. And she took too many Hydroxyzines and Amitryptyline tabs as she wanted to sleep as she could not get enough rest.  Past Psychiatric History: No previous h/O Inpt to psychiatry. No H/O suicide attempts.  Risk to Self: Suicidal Ideation: Yes-Currently Present Suicidal Intent: Yes-Currently Present Is patient at risk for suicide?: Yes Suicidal Plan?: Yes-Currently Present Specify Current Suicidal Plan: Pt took an overdoes of her prescription meds Access to Means: Yes Specify Access to Suicidal Means: Pt has access to her prescriptions What has been your use of drugs/alcohol within the last 12 months?: None reported How many times?: 0 Other Self Harm Risks: None reported Triggers for Past Attempts: Family contact Intentional Self Injurious Behavior: None Risk to Others: Homicidal Ideation: No Thoughts of Harm to Others: No Current Homicidal Intent: No Current Homicidal Plan: No Access to Homicidal Means: No Identified Victim: None reported History of harm to others?: No Assessment of Violence: None Noted Violent Behavior Description: N/A Does patient have access to weapons?: No Criminal Charges Pending?: No Does patient have a court date: No Prior Inpatient Therapy: Prior Inpatient Therapy: No Prior Therapy Dates: N/a Prior Therapy Facilty/Provider(s): N/A Reason for Treatment: N/A Prior Outpatient Therapy: Prior Outpatient Therapy: No Prior Therapy  Dates: N/A Prior Therapy Facilty/Provider(s): n/A Reason for Treatment: N/A Does patient have an ACCT team?: No Does patient have Intensive In-House Services?  : No Does patient have [facility] services? : No Does patient have P4CC services?: No  Past Medical History:  Past Medical History  Diagnosis Date  . Hypertension   . CHF (congestive heart failure) ([place])   . COPD (chronic obstructive pulmonary disease) ([place])   . Diabetes mellitus without complication ([place])   . Depression   . Anxiety   . Chronic kidney disease   . Arthritis   . Anemia   . Hepatitis   . Tobacco dependence   . Emphysema of lung ([place])   . Hypercholesterolemia     Past Surgical History  Procedure Laterality Date  . Cyst removed  from rt hand    . Tonsillectomy    . Appendectomy    . Rt. tubal and ovary removed    . Cholecystectomy    . Peripheral vascular catheterization N/A 07/25/2015    Procedure: A/V Shuntogram/Fistulagram;  Surgeon: Algernon Huxley, MD;  Location: [facility] [facility];  Service: Cardiovascular;  Laterality: N/A;  . Peripheral vascular catheterization Left 07/25/2015    Procedure: A/V Shunt Intervention;  Surgeon: Algernon Huxley, MD;  Location: [place] [facility];  Service: Cardiovascular;  Laterality: Left;  . Peripheral vascular catheterization Left 10/07/2015    Procedure: A/V Shuntogram/Fistulagram;  Surgeon: Algernon Huxley, MD;  Location: [place] [facility];  Service: Cardiovascular;  Laterality: Left;  . Peripheral vascular catheterization N/A 10/07/2015    Procedure: A/V Shunt Intervention;  Surgeon: Algernon Huxley, MD;  Location: [place] [facility];  Service: Cardiovascular;  Laterality: N/A;  .  Peripheral vascular catheterization  10/07/2015    Procedure: Dialysis/Perma Catheter Insertion;  Surgeon: Algernon Huxley, MD;  Location: [facility] [facility];  Service: Cardiovascular;;   Family History: History reviewed. No pertinent family history. Family Psychiatric  History: None Social History:   History  Alcohol Use No     History  Drug Use No    Social History   Social History  . Marital Status: Legally Separated    Spouse Name: N/A  . Number of Children: N/A  . Years of Education: N/A   Social History Main Topics  . Smoking status: Current Every Day Smoker -- 0.50 packs/day for 17 years    Types: Cigarettes  . Smokeless tobacco: None  . Alcohol Use: No  . Drug Use: No  . Sexual Activity: Not Asked   Other Topics Concern  . None   Social History Narrative   Additional Social History:    History of alcohol / drug use?: No history of alcohol / drug abuse                     Allergies:   Allergies  Allergen Reactions  . Cinnamon Anaphylaxis  . Cinoxate Anaphylaxis  . Tylenol [Acetaminophen] Anaphylaxis  . Garlic Hives  . Onion Hives and Swelling    Labs:  Results for orders placed or performed during the hospital encounter of 10/11/15 (from the past 48 hour(s))  Comprehensive metabolic panel     Status: Abnormal   Collection Time: 10/11/15  6:47 AM  Result Value Ref Range   Sodium 137 135 - 145 mmol/L   Potassium 4.0 3.5 - 5.1 mmol/L   Chloride 105 101 - 111 mmol/L   CO2 23 22 - 32 mmol/L   Glucose, Bld 121 (H) 65 - 99 mg/dL   BUN 34 (H) 6 - 20 mg/dL   Creatinine, Ser 4.62 (H) 0.44 - 1.00 mg/dL   Calcium 8.0 (L) 8.9 - 10.3 mg/dL   Total Protein 7.0 6.5 - 8.1 g/dL   Albumin 3.0 (L) 3.5 - 5.0 g/dL   AST 17 15 - 41 U/L   ALT 8 (L) 14 - 54 U/L   Alkaline Phosphatase 57 38 - 126 U/L   Total Bilirubin <0.1 (L) 0.3 - 1.2 mg/dL   GFR calc non Af Amer 10 (L) >60 mL/min   GFR calc Af Amer 12 (L) >60 mL/min    Comment: (NOTE) The eGFR has been calculated using the CKD EPI equation. This calculation has not been validated in all clinical situations. eGFR's persistently <60 mL/min signify possible Chronic Kidney Disease.    Anion gap 9 5 - 15  Ethanol (ETOH)     Status: None   Collection Time: 10/11/15  6:47 AM  Result Value Ref Range    Alcohol, Ethyl (B) <5 <5 mg/dL    Comment:        LOWEST DETECTABLE LIMIT FOR SERUM ALCOHOL IS 5 mg/dL FOR MEDICAL PURPOSES ONLY   Salicylate level     Status: None   Collection Time: 10/11/15  6:47 AM  Result Value Ref Range   Salicylate Lvl <5.6 2.8 - 30.0 mg/dL  Acetaminophen level     Status: Abnormal   Collection Time: 10/11/15  6:47 AM  Result Value Ref Range   Acetaminophen (Tylenol), Serum <10 (L) 10 - 30 ug/mL    Comment:        THERAPEUTIC CONCENTRATIONS VARY SIGNIFICANTLY. A RANGE OF 10-30 ug/mL MAY BE AN EFFECTIVE  CONCENTRATION FOR MANY PATIENTS. HOWEVER, SOME ARE BEST TREATED AT CONCENTRATIONS OUTSIDE THIS RANGE. ACETAMINOPHEN CONCENTRATIONS >150 ug/mL AT 4 HOURS AFTER INGESTION AND >50 ug/mL AT 12 HOURS AFTER INGESTION ARE OFTEN ASSOCIATED WITH TOXIC REACTIONS.   CBC     Status: Abnormal   Collection Time: 10/11/15  6:47 AM  Result Value Ref Range   WBC 6.0 3.6 - 11.0 K/uL   RBC 3.65 (L) 3.80 - 5.20 MIL/uL   Hemoglobin 9.6 (L) 12.0 - 16.0 g/dL   HCT 29.9 (L) 35.0 - 47.0 %   MCV 82.0 80.0 - 100.0 fL   MCH 26.3 26.0 - 34.0 pg   MCHC 32.1 32.0 - 36.0 g/dL   RDW 21.9 (H) 11.5 - 14.5 %   Platelets 149 (L) 150 - 440 K/uL  Glucose, capillary     Status: Abnormal   Collection Time: 10/11/15 11:22 AM  Result Value Ref Range   Glucose-Capillary 134 (H) 65 - 99 mg/dL   Comment 1 Notify RN   Hepatitis B surface antigen     Status: None   Collection Time: 10/11/15  4:17 PM  Result Value Ref Range   Hepatitis B Surface Ag Negative Negative    Comment: (NOTE) Performed At: [facility] [place], [place] [id number] Lindon Romp MD [id number]   Hepatitis B surface antibody     Status: None   Collection Time: 10/11/15  4:17 PM  Result Value Ref Range   Hepatitis B-Post 10.7 Immunity>9.9 mIU/mL    Comment: (NOTE) **Verified by repeat analysis**  Status of Immunity                     Anti-HBs Level  ------------------                      -------------- Inconsistent with Immunity                   0.0 - 9.9 Consistent with Immunity                          >9.9 Performed At: [facility] [place], [place] [id number] Lindon Romp MD [id number]   Glucose, capillary     Status: None   Collection Time: 10/11/15  7:18 PM  Result Value Ref Range   Glucose-Capillary 72 65 - 99 mg/dL  Urine Drug Screen, Qualitative ([facility] only)     Status: Abnormal   Collection Time: 10/11/15  8:46 PM  Result Value Ref Range   Tricyclic, Ur Screen POSITIVE (A) NONE DETECTED   Amphetamines, Ur Screen NONE DETECTED NONE DETECTED   MDMA (Ecstasy)Ur Screen NONE DETECTED NONE DETECTED   Cocaine Metabolite,Ur [place] NONE DETECTED NONE DETECTED   Opiate, Ur Screen NONE DETECTED NONE DETECTED   Phencyclidine (PCP) Ur S NONE DETECTED NONE DETECTED   Cannabinoid 50 Ng, Ur [place] NONE DETECTED NONE DETECTED   Barbiturates, Ur Screen NONE DETECTED NONE DETECTED   Benzodiazepine, Ur Scrn POSITIVE (A) NONE DETECTED   Methadone Scn, Ur NONE DETECTED NONE DETECTED    Comment: (NOTE) 016  Tricyclics, urine               Cutoff 1000 ng/mL 200  Amphetamines, urine             Cutoff 1000 ng/mL 300  MDMA (Ecstasy), urine           Cutoff 500 ng/mL 400  Cocaine Metabolite, urine       Cutoff 300 ng/mL 500  Opiate, urine                   Cutoff 300 ng/mL 600  Phencyclidine (PCP), urine      Cutoff 25 ng/mL 700  Cannabinoid, urine              Cutoff 50 ng/mL 800  Barbiturates, urine             Cutoff 200 ng/mL 900  Benzodiazepine, urine           Cutoff 200 ng/mL 1000 Methadone, urine                Cutoff 300 ng/mL 1100 1200 The urine drug screen provides only a preliminary, unconfirmed 1300 analytical test result and should not be used for non-medical 1400 purposes. Clinical consideration and professional judgment should 1500 be applied to any positive drug screen result due to possible 1600 interfering substances. A more  specific alternate chemical method 1700 must be used in order to obtain a confirmed analytical result.  1800 Gas chromato graphy / mass spectrometry (GC/MS) is the preferred 1900 confirmatory method.   Urine culture     Status: None (Preliminary result)   Collection Time: 10/11/15  8:46 PM  Result Value Ref Range   Specimen Description URINE, CLEAN CATCH    Special Requests NONE    Culture NO GROWTH < 12 HOURS    Report Status PENDING   Glucose, capillary     Status: None   Collection Time: 10/11/15 11:15 PM  Result Value Ref Range   Glucose-Capillary 97 65 - 99 mg/dL  Glucose, capillary     Status: Abnormal   Collection Time: 10/12/15  5:44 AM  Result Value Ref Range   Glucose-Capillary 55 (L) 65 - 99 mg/dL  Glucose, capillary     Status: Abnormal   Collection Time: 10/12/15  6:25 AM  Result Value Ref Range   Glucose-Capillary 118 (H) 65 - 99 mg/dL  Glucose, capillary     Status: Abnormal   Collection Time: 10/12/15  7:54 AM  Result Value Ref Range   Glucose-Capillary 210 (H) 65 - 99 mg/dL  Glucose, capillary     Status: Abnormal   Collection Time: 10/12/15 11:38 AM  Result Value Ref Range   Glucose-Capillary 181 (H) 65 - 99 mg/dL    Current Facility-Administered Medications  Medication Dose Route Frequency Provider Last Rate Last Dose  . amitriptyline (ELAVIL) tablet 5 mg  5 mg Oral QHS Hinda Kehr, MD      . diltiazem (CARDIZEM CD) 24 hr capsule 120 mg  120 mg Oral QPC supper Murlean Iba, MD      . hydrALAZINE (APRESOLINE) tablet 100 mg  100 mg Oral BID Hinda Kehr, MD   100 mg at 10/12/15 1506  . hydrOXYzine (ATARAX/VISTARIL) tablet 50 mg  50 mg Oral QHS Hinda Kehr, MD   50 mg at 10/12/15 0253  . insulin aspart (novoLOG) injection 0-5 Units  0-5 Units Subcutaneous QHS Lytle Butte, MD   0 Units at 10/12/15 0207  . insulin aspart (novoLOG) injection 0-9 Units  0-9 Units Subcutaneous TID WC Lytle Butte, MD   2 Units at 10/12/15 1147  . insulin glargine (LANTUS)  injection 15 Units  15 Units Subcutaneous Daily Hinda Kehr, MD   15 Units at 10/12/15 1151  . irbesartan (AVAPRO) tablet 150 mg  150 mg Oral QPC supper  Murlean Iba, MD      . metoprolol tartrate (LOPRESSOR) tablet 25 mg  25 mg Oral BID Hinda Kehr, MD   25 mg at 10/12/15 1148  . mometasone-formoterol (DULERA) 100-5 MCG/ACT inhaler 2 puff  2 puff Inhalation BID Hinda Kehr, MD   2 puff at 10/12/15 [phone]  . multivitamin (RENA-VIT) tablet 1 tablet  1 tablet Oral QHS Hinda Kehr, MD      . sevelamer carbonate (RENVELA) tablet 1,600 mg  1,600 mg Oral TID WC Hinda Kehr, MD   1,600 mg at 10/12/15 1150  . venlafaxine XR (EFFEXOR-XR) 24 hr capsule 150 mg  150 mg Oral Daily Hinda Kehr, MD   150 mg at 10/12/15 1148   Current Outpatient Prescriptions  Medication Sig Dispense Refill  . amitriptyline (ELAVIL) 10 MG tablet Take 5 mg by mouth at bedtime.    Marland Kitchen b complex-vitamin c-folic acid (NEPHRO-VITE) 0.8 MG TABS tablet Take 1 tablet by mouth daily.    Marland Kitchen diltiazem (CARDIZEM SR) 120 MG 12 hr capsule Take 120 mg by mouth 2 (two) times daily.    . Fluticasone-Salmeterol (ADVAIR) 250-50 MCG/DOSE AEPB Inhale 1 puff into the lungs 2 (two) times daily.    . hydrALAZINE (APRESOLINE) 100 MG tablet Take 100 mg by mouth 2 (two) times daily.    . hydrOXYzine (ATARAX/VISTARIL) 25 MG tablet Take 50 mg by mouth at bedtime.    . insulin glargine (LANTUS) 100 UNIT/ML injection Inject 15 Units into the skin daily.    . insulin regular (NOVOLIN R,HUMULIN R) 100 units/mL injection Inject 5-10 Units into the skin 3 (three) times daily before meals. Pt uses per sliding scale.    . metoprolol tartrate (LOPRESSOR) 25 MG tablet Take 25 mg by mouth 2 (two) times daily.    . Oxycodone HCl 10 MG TABS Take 10 mg by mouth 4 (four) times daily as needed (for pain).    Marland Kitchen sevelamer carbonate (RENVELA) 800 MG tablet Take 1,600 mg by mouth 3 (three) times daily with meals.    . venlafaxine XR (EFFEXOR-XR) 75 MG 24 hr capsule Take 150  mg by mouth daily.    Marland Kitchen docusate sodium (COLACE) 100 MG capsule Take 100 mg by mouth as needed for mild constipation.    Marland Kitchen omeprazole (PRILOSEC) 20 MG capsule Take 20 mg by mouth daily.    Marland Kitchen triamcinolone cream (KENALOG) 0.1 % Apply 1 application topically as needed (for psoriasis).      Musculoskeletal: Strength & Muscle Tone: within normal limits Gait & Station: normal Patient leans: N/A  Psychiatric Specialty Exam: Review of Systems  All other systems reviewed and are negative.   Blood pressure 166/71, pulse 88, temperature 98.4 F (36.9 C), temperature source Oral, resp. rate 18, SpO2 97 %.There is no weight on file to calculate BMI.  General Appearance: Casual  Eye Contact::  Fair  Speech:  Clear and Coherent  Volume:  Normal  Mood:  Anxious, Depressed, Irritable and adequate.  Affect:  Appropriate  Thought Process:  Circumstantial  Orientation:  Full (Time, Place, and Person)  Thought Content:  NA  Suicidal Thoughts:  No  Homicidal Thoughts:  No  Memory:  Immediate;   Fair Recent;   Fair Remote;   Fair guarded.  Judgement:  Fair  Insight:  Fair  Psychomotor Activity:    Concentration:  Fair  Recall:  [employer] of Knowledge:Fair  Language: Fair  Akathisia:  No  Handed:  Right  AIMS (if indicated):  Assets:  Communication Skills Desire for Improvement Physical Health  ADL's:  Intact  Cognition: WNL  Sleep:      Treatment Plan Summary: Plan D/C IVC and D/C Home with follow up appt at local [facility].  Disposition: No evidence of imminent risk to self or others at present.    Camie Patience K 10/12/2015 4:00 PM

## 2015-10-12 NOTE — ED Notes (Signed)
Pt with less shaking, no sweating noted. Pt states "i feel better now."

## 2015-10-12 NOTE — ED Notes (Signed)
Pt up to restroom with cane. Pt sweating, shaking. States 'i think my blood sugar is low." blood sugar checked, 55. Pt provided with orange juice and meal tray. Pt states "i need to take my renvela with this to bind the phosphorus." dr. Karma Greaser notified or blood sugar and need for renvela.

## 2015-10-12 NOTE — Discharge Instructions (Signed)
Chronic Kidney Disease Chronic kidney disease happens when the kidneys are damaged over a long period. The kidneys are two organs that do many important jobs in the body. These jobs include:  Removing wastes and extra fluids from the blood.  Making hormones that help to keep the body healthy.  Making sure that the body has the right amount of fluids and chemicals. Chronic kidney disease may be caused by many things. The kidney damage occurs slowly. If too much damage occurs, the kidneys may stop working the way that they should. This is dangerous. Treatment can help to slow down the damage and keep it from getting worse. HOME CARE  Follow your diet as told by your doctor. You may need to limit the amount of salt (sodium) and protein that you eat each day.  Take medicines only as told by your doctor. Do not take any new medicines unless your doctor approves it.  Quit smoking if you smoke. Talk to your doctor about programs that may help you quit smoking.  Have your blood pressure checked regularly and keep track of the results.  Start or keep doing an exercise plan.  Get shots (immunizations) as told by your doctor.  Take vitamins and minerals as told by your doctor.  Keep all follow-up visits as told by your doctor. This is important. GET HELP RIGHT AWAY IF:   Your symptoms get worse.  You have new symptoms.  You have symptoms of end-stage kidney disease. These include:  Headaches.  Skin that is darker or lighter than normal.  Numbness in the hands or feet.  Easy bruising.  Frequent hiccups.  Stopping of menstrual periods in women.  You have a fever.  You are making very little pee (urine).  You have pain or bleeding when you pee.   This information is not intended to replace advice given to you by your health care provider. Make sure you discuss any questions you have with your health care provider.   Document Released: 03/03/2010 Document Revised: 08/28/2015  Document Reviewed: 08/05/2012 Elsevier Interactive Patient Education [employer].  Drug Overdose Drug overdose happens when you take too much of a drug. An overdose can occur with illegal drugs, prescription drugs, or over-the-counter (OTC) drugs. The effects of drug overdose can be mild, dangerous, or even deadly. CAUSES Drug overdose may be caused by:  Taking too much of a drug on purpose.  Taking too much of a drug by accident.  An error made by a health care provider who prescribes a drug.  An error made by a pharmacist who fills the prescription order. Drugs that commonly cause overdose include:  Mental health drugs.  Pain medicines.  Illegal drugs.  OTC cough and cold medicines.  Heart medicines.  Seizure medicines. RISK FACTORS Drug overdose is more likely in:  Children. They may be attracted to colorful pills. Because of children's small size, even a small amount of a drug can be dangerous.  Elderly people. They may be taking many different drugs. Elderly people may have difficulty reading labels or remembering when they last took their medicine. The risk of drug overdose is also higher for someone who:  Takes illegal drugs.  Takes a drug and drinks alcohol.  Has a mental health condition. SYMPTOMS Signs and symptoms of drug overdose depend on the drug and the amount that was taken. Common danger signs include:  Behavior changes.  Sleepiness.  Slowed breathing.  Nausea and vomiting.  Seizures.  Changes in eye  pupil size (very large or very small). If there are signs of very low blood pressure from a drug overdose (shock), emergency treatment is required. These signs include:  Cold and clammy skin.  Pale skin.  Blue lips.  Very slow breathing.  Extreme sleepiness.  Loss of consciousness. DIAGNOSIS Drug overdose may be diagnosed based on your symptoms. It is important that you tell your health care provider:  All of the drugs that  you have taken.  When you took the drugs.  Whether you were drinking alcohol. Your health care provider will do a physical exam. This exam may include:  Checking and monitoring your heart rate and rhythm, your temperature, and your blood pressure (vital signs).  Checking your breathing and oxygen level. You may also have tests, including:   Urine tests to check for drugs in your system.  Blood tests to check for:  Drugs in your system.  Signs of an imbalance of your blood minerals (electrolytes).  Liver damage.  Kidney damage. TREATMENT Supporting your vital signs and your breathing is the first step in treating a drug overdose. Treatment may also include:  Receiving fluids and electrolytes through an IV tube.  Having a breathing tube (endotracheal tube) inserted in your airway to help you breathe.  Having a tube passed through your nose and into your stomach (nasogastric tube) to wash out your stomach.  Medicines. You may get medicines to:  Make you vomit.  Absorb any medicine that is left in your digestive system (activated charcoal).  Block or reverse the effect of the drug that caused the overdose.  Having your blood filtered through an artificial kidney machine (hemodialysis). You may need this if your overdose is severe or if you have kidney failure.  Having ongoing counseling and mental health support if you intentionally overdosed or used an illegal drug. HOME CARE INSTRUCTIONS  Take medicines only as directed by your health care provider. Always ask your health care provider to discuss the possible side effects of any new drug that you start taking.  Keep a list of all of the drugs that you take, including over-the-counter medicines. Bring this list with you to all of your medical visits.  Read the drug inserts that come with your medicines.  Do not use illegal drugs.  Do not drink alcohol when taking drugs.  Store all medicines in safety containers  that are out of the reach of children.  Keep the phone number of your local poison control center near your phone or on your cell phone.  Get help if you are struggling with alcohol or drug use.  Get help if you are struggling with depression or another mental health problem.  Keep all follow-up visits as directed by your health care provider. This is important. SEEK MEDICAL CARE IF:  Your symptoms return.  You develop any new signs or symptoms when you are taking medicines. SEEK IMMEDIATE MEDICAL CARE IF:  You think that you or someone else may have taken too much of a drug. The hotline of the [facility] is 716-829-1262.  You or someone else is having symptoms of a drug overdose.  You have serious thoughts about hurting yourself or others.  You have chest pain.  You have difficulty breathing.  You have a loss of consciousness. Drug overdose is an emergency. Do not wait to see if the symptoms will go away. Get medical help right away. Call your local emergency services (911 in the [place] Do  not drive yourself to the hospital.   This information is not intended to replace advice given to you by your health care provider. Make sure you discuss any questions you have with your health care provider.   Document Released: 04/23/2015 Document Reviewed: 04/23/2015 Elsevier Interactive Patient Education [employer].

## 2015-10-12 NOTE — ED Notes (Addendum)
Dr. Karma Greaser notified of pt's blood pressure 193/82. Order for cardiazem  120mg , hydralazine 100mg  and metoprolol 25mg  received all to be given po.

## 2015-10-12 NOTE — ED Notes (Signed)
Pt awakes easily for cardiazem administration and vital sign check. Pt denies needs at this time, bed readjusted for comfort.

## 2015-10-12 NOTE — ED Notes (Signed)
Blood sugar up to 118. Pt states no longer feels shaky, skin pwd. resps unlabored.

## 2015-10-12 NOTE — ED Provider Notes (Signed)
-----------------------------------------   5:57 AM on 10/12/2015 -----------------------------------------   Blood pressure 144/66, pulse 86, temperature 98.4 F (36.9 C), temperature source Oral, resp. rate 18, SpO2 96 %.  The patient had no acute events since last update.  Calm and cooperative at this time.  Her nurse, April, put in for her regular blood pressure medications as she was noted to be substantially hypertensive even after dialysis today.  The patient also became significantly hypoglycemic with diaphoresis, weakness, and a fingerstick blood sugar in the 40s.  She was given something to eat and recovered quickly.  She is pending psych disposition.  There is no indication for medical admission at this time.    Hinda Kehr, MD 10/12/15 [phone]

## 2015-10-12 NOTE — ED Notes (Signed)
Pt up to commode to have diarrhea. Pt with  Soiled underwear. New underwear and pants provided.

## 2015-10-12 NOTE — Progress Notes (Signed)
  Subjective:   Patient is admitted to the ER under involuntary commitment for attempted suicide on thursday with hydroxyzine and amitriptyline. At present she is alert, oriented. No complaints of shortness of breath. She recently had declot of her fistula. Currently PermCath is being utilized for dialysis.  Patient underwent dialysis on Friday. Tolerated well. No complications Today she still continues to feel depressed about her home situation  Objective:  Vital signs in last 24 hours:  Temp:  [98.1 F (36.7 C)-98.4 F (36.9 C)] 98.4 F (36.9 C) (10/21 1930) Pulse Rate:  [73-101] 88 (10/22 1142) Resp:  [13-28] 18 (10/22 0050) BP: (142-193)/(66-84) 166/71 mmHg (10/22 1142) SpO2:  [94 %-99 %] 97 % (10/22 1142)  Weight change:  There were no vitals filed for this visit.  Intake/Output:    Intake/Output Summary (Last 24 hours) at 10/12/15 1318 Last data filed at 10/11/15 1853  Gross per 24 hour  Intake      0 ml  Output   1000 ml  Net  -1000 ml     Physical Exam: General:  no acute distress, laying in the bed   HEENT  anicteric, moist mucous membranes   Neck  supple, no masses   Pulm/lungs  normal respiratory effort, clear to auscultation   CVS/Heart  regular rhythm, no rub or gallop   Abdomen:   soft, nontender, nondistended   Extremities:  no peripheral edema   Neurologic:  alert, oriented   Skin:  no acute rashes   Access:  right IJ PermCath, left arm AV fistula with evidence of infiltration        Basic Metabolic Panel:   Recent Labs Lab 10/11/15 0647  NA 137  K 4.0  CL 105  CO2 23  GLUCOSE 121*  BUN 34*  CREATININE 4.62*  CALCIUM 8.0*     CBC:  Recent Labs Lab 10/11/15 0647  WBC 6.0  HGB 9.6*  HCT 29.9*  MCV 82.0  PLT 149*      Microbiology:  Recent Results (from the past 720 hour(s))  Urine culture     Status: None (Preliminary result)   Collection Time: 10/11/15  8:46 PM  Result Value Ref Range Status   Specimen Description  URINE, CLEAN CATCH  Final   Special Requests NONE  Final   Culture NO GROWTH < 12 HOURS  Final   Report Status PENDING  Incomplete    Coagulation Studies: No results for input(s): LABPROT, INR in the last 72 hours.  Urinalysis: No results for input(s): COLORURINE, LABSPEC, PHURINE, GLUCOSEU, HGBUR, BILIRUBINUR, KETONESUR, PROTEINUR, UROBILINOGEN, NITRITE, LEUKOCYTESUR in the last 72 hours.  Invalid input(s): APPERANCEUR    Imaging: No results found.   Medications:       Assessment/ Plan:  49 y.o. female with medical history of hypertension, diastolic heart failure, diabetes mellitus type 2, and anemia of chronic kidney disease,  hepatitis C, depression, psoriasis, peripheral neuropathy, ESRD  1. Hypertension 2. End-stage renal disease. [employer] dialysis. Monday, Wednesday, Friday second shift.[facility] 3. Anemia of chronic kidney disease 4. Secondary hyperparathyroidism 5 Depression, suicide attempt. Psychiatry evaluation is ongoing. Plan: Multiple medications are being given for hypertension. We'll change Cardizem to sustained release to be given in the evening  Add Avapro. May increase dose as needed Dialysis today and continue schedule Low-dose Procrit with dialysis Monitor phosphorus Discussed case with the ER team and ER social worker to work on assisting her with alternative housing   LOS:  Katina Remick 10/22/20161:18 PM

## 2015-10-13 ENCOUNTER — Encounter: Payer: Self-pay | Admitting: Nephrology

## 2015-10-13 LAB — URINE CULTURE

## 2015-11-26 ENCOUNTER — Other Ambulatory Visit: Payer: Self-pay | Admitting: Vascular Surgery

## 2015-11-28 ENCOUNTER — Encounter
Admission: RE | Disposition: A | Discharge status: Home / Self Care | Payer: Self-pay | Source: Ambulatory Visit | Attending: Vascular Surgery

## 2015-11-28 ENCOUNTER — Ambulatory Visit
Admission: RE | Admit: 2015-11-28 | Discharge: 2015-11-28 | Disposition: A | Discharge status: Home / Self Care | Payer: Medicare Other | Source: Ambulatory Visit | Attending: Vascular Surgery | Admitting: Vascular Surgery

## 2015-11-28 SURGERY — DIALYSIS/PERMA CATHETER REMOVAL
Anesthesia: Moderate Sedation

## 2015-11-28 NOTE — H&P (Signed)
  [facility] History & Physical Update  The patient was interviewed and re-examined.  The patient's previous History and Physical has been reviewed and is unchanged.  There is no change in the plan of care. We plan to proceed with the scheduled procedure.  Carlean Crowl, MD  11/28/2015, 8:39 AM

## 2015-12-17 ENCOUNTER — Ambulatory Visit
Admission: RE | Admit: 2015-12-17 | Discharge: 2015-12-17 | Disposition: A | Discharge status: Home / Self Care | Payer: Medicare Other | Source: Ambulatory Visit | Attending: Vascular Surgery | Admitting: Vascular Surgery

## 2015-12-17 ENCOUNTER — Encounter
Admission: RE | Disposition: A | Discharge status: Home / Self Care | Payer: Self-pay | Source: Ambulatory Visit | Attending: Vascular Surgery

## 2015-12-17 ENCOUNTER — Encounter: Payer: Self-pay | Admitting: Vascular Surgery

## 2015-12-17 DIAGNOSIS — E7800 Pure hypercholesterolemia, unspecified: Secondary | ICD-10-CM | POA: Insufficient documentation

## 2015-12-17 DIAGNOSIS — F329 Major depressive disorder, single episode, unspecified: Secondary | ICD-10-CM | POA: Insufficient documentation

## 2015-12-17 DIAGNOSIS — F17200 Nicotine dependence, unspecified, uncomplicated: Secondary | ICD-10-CM | POA: Insufficient documentation

## 2015-12-17 DIAGNOSIS — N186 End stage renal disease: Secondary | ICD-10-CM | POA: Diagnosis not present

## 2015-12-17 DIAGNOSIS — Z992 Dependence on renal dialysis: Secondary | ICD-10-CM | POA: Diagnosis not present

## 2015-12-17 DIAGNOSIS — Y832 Surgical operation with anastomosis, bypass or graft as the cause of abnormal reaction of the patient, or of later complication, without mention of misadventure at the time of the procedure: Secondary | ICD-10-CM | POA: Diagnosis not present

## 2015-12-17 DIAGNOSIS — I509 Heart failure, unspecified: Secondary | ICD-10-CM | POA: Insufficient documentation

## 2015-12-17 DIAGNOSIS — I120 Hypertensive chronic kidney disease with stage 5 chronic kidney disease or end stage renal disease: Secondary | ICD-10-CM | POA: Insufficient documentation

## 2015-12-17 DIAGNOSIS — Z452 Encounter for adjustment and management of vascular access device: Secondary | ICD-10-CM | POA: Insufficient documentation

## 2015-12-17 DIAGNOSIS — J439 Emphysema, unspecified: Secondary | ICD-10-CM | POA: Insufficient documentation

## 2015-12-17 DIAGNOSIS — E1122 Type 2 diabetes mellitus with diabetic chronic kidney disease: Secondary | ICD-10-CM | POA: Diagnosis not present

## 2015-12-17 HISTORY — PX: PERIPHERAL VASCULAR CATHETERIZATION: SHX172C

## 2015-12-17 SURGERY — DIALYSIS/PERMA CATHETER REMOVAL
Anesthesia: Moderate Sedation

## 2015-12-17 MED ORDER — LIDOCAINE HCL (PF) 1 % IJ SOLN
INTRAMUSCULAR | Status: DC | PRN
  Administered 2015-12-17: 20 mL via INTRADERMAL

## 2015-12-17 SURGICAL SUPPLY — 4 items
FCP FG STRG 5.5XNS LF DISP (INSTRUMENTS) ×1
FORCEPS FG STRG 5.5XNS LF DISP (INSTRUMENTS) ×1 IMPLANT
FORCEPS KELLY 5.5 STR (INSTRUMENTS) ×2
TRAY LACERAT/PLASTIC (MISCELLANEOUS) ×3 IMPLANT

## 2015-12-17 NOTE — Op Note (Signed)
  OPERATIVE NOTE   PROCEDURE: 1. Removal of a right internal jugular tunneled dialysis catheter  PRE-OPERATIVE DIAGNOSIS: Complication of dialysis catheter, End stage renal disease  POST-OPERATIVE DIAGNOSIS: Same  SURGEON: Teniyah Seivert, Dolores Lory, M.D.  ANESTHESIA: Local anesthetic with 1% lidocaine with epinephrine   ESTIMATED BLOOD LOSS: Minimal   FINDING(S): 1. Catheter intact   SPECIMEN(S):  Catheter  INDICATIONS:   [name] is a 49 y.o. female who presents with nonfunctioning right IJ catheter with a working left arm AV access.  The patient has undergone placement of an extremity access which is working and this has been successfully cannulated without difficulty.  therefore is undergoing removal of his tunneled catheter which is no longer needed to avoid septic complications.   DESCRIPTION: After obtaining full informed written consent, the patient was positioned supine. The right internal jugular catheter and surrounding area is prepped and draped in a sterile fashion. The cuff was localized by palpation and noted to be less than 3 cm from the exit site. After appropriate timeout is called, 1% lidocaine with epinephrine is infiltrated into the surrounding tissues around the cuff. Small transverse incision is created at the exit site with an 11 blade scalpel and the dissection was carried up along the catheter to expose the cuff of the tunneled catheter.  The catheter cuff is then freed from the surrounding attachments and adhesions. Once the catheter has been freed circumferentially it is removed in 1 piece. Light pressure was held at the base of the neck.   Antibiotic ointment and a sterile dressing is applied to the exit site. Patient tolerated procedure well and there were no complications.  COMPLICATIONS: None  CONDITION: Unchanged  Fadil Macmaster, Dolores Lory, M.D. [facility] Office: [phone]  12/17/2015,11:17 AM

## 2015-12-17 NOTE — H&P (Signed)
[facility] History & Physical Update  The patient was interviewed and re-examined.  The patient's previous History and Physical has been reviewed and is unchanged.  There is no change in the plan of care. We plan to proceed with the scheduled procedure.  Masiel Gentzler, Dolores Lory, MD  12/17/2015, 11:19 AM

## 2015-12-22 DIAGNOSIS — Z992 Dependence on renal dialysis: Secondary | ICD-10-CM | POA: Diagnosis not present

## 2015-12-22 DIAGNOSIS — N186 End stage renal disease: Secondary | ICD-10-CM | POA: Diagnosis not present

## 2015-12-23 DIAGNOSIS — N186 End stage renal disease: Secondary | ICD-10-CM | POA: Diagnosis not present

## 2015-12-23 DIAGNOSIS — D509 Iron deficiency anemia, unspecified: Secondary | ICD-10-CM | POA: Diagnosis not present

## 2015-12-23 DIAGNOSIS — D631 Anemia in chronic kidney disease: Secondary | ICD-10-CM | POA: Diagnosis not present

## 2015-12-23 DIAGNOSIS — Z992 Dependence on renal dialysis: Secondary | ICD-10-CM | POA: Diagnosis not present

## 2015-12-27 DIAGNOSIS — N186 End stage renal disease: Secondary | ICD-10-CM | POA: Diagnosis not present

## 2015-12-27 DIAGNOSIS — D631 Anemia in chronic kidney disease: Secondary | ICD-10-CM | POA: Diagnosis not present

## 2015-12-27 DIAGNOSIS — D509 Iron deficiency anemia, unspecified: Secondary | ICD-10-CM | POA: Diagnosis not present

## 2015-12-27 DIAGNOSIS — Z992 Dependence on renal dialysis: Secondary | ICD-10-CM | POA: Diagnosis not present

## 2015-12-30 DIAGNOSIS — D631 Anemia in chronic kidney disease: Secondary | ICD-10-CM | POA: Diagnosis not present

## 2015-12-30 DIAGNOSIS — N186 End stage renal disease: Secondary | ICD-10-CM | POA: Diagnosis not present

## 2015-12-30 DIAGNOSIS — D509 Iron deficiency anemia, unspecified: Secondary | ICD-10-CM | POA: Diagnosis not present

## 2015-12-30 DIAGNOSIS — Z992 Dependence on renal dialysis: Secondary | ICD-10-CM | POA: Diagnosis not present

## 2016-01-01 DIAGNOSIS — N186 End stage renal disease: Secondary | ICD-10-CM | POA: Diagnosis not present

## 2016-01-01 DIAGNOSIS — D631 Anemia in chronic kidney disease: Secondary | ICD-10-CM | POA: Diagnosis not present

## 2016-01-01 DIAGNOSIS — Z992 Dependence on renal dialysis: Secondary | ICD-10-CM | POA: Diagnosis not present

## 2016-01-01 DIAGNOSIS — D509 Iron deficiency anemia, unspecified: Secondary | ICD-10-CM | POA: Diagnosis not present

## 2016-01-03 DIAGNOSIS — N186 End stage renal disease: Secondary | ICD-10-CM | POA: Diagnosis not present

## 2016-01-03 DIAGNOSIS — D631 Anemia in chronic kidney disease: Secondary | ICD-10-CM | POA: Diagnosis not present

## 2016-01-03 DIAGNOSIS — Z992 Dependence on renal dialysis: Secondary | ICD-10-CM | POA: Diagnosis not present

## 2016-01-03 DIAGNOSIS — D509 Iron deficiency anemia, unspecified: Secondary | ICD-10-CM | POA: Diagnosis not present

## 2016-01-06 DIAGNOSIS — D509 Iron deficiency anemia, unspecified: Secondary | ICD-10-CM | POA: Diagnosis not present

## 2016-01-06 DIAGNOSIS — N186 End stage renal disease: Secondary | ICD-10-CM | POA: Diagnosis not present

## 2016-01-06 DIAGNOSIS — D631 Anemia in chronic kidney disease: Secondary | ICD-10-CM | POA: Diagnosis not present

## 2016-01-06 DIAGNOSIS — Z992 Dependence on renal dialysis: Secondary | ICD-10-CM | POA: Diagnosis not present

## 2016-01-08 DIAGNOSIS — Z992 Dependence on renal dialysis: Secondary | ICD-10-CM | POA: Diagnosis not present

## 2016-01-08 DIAGNOSIS — D631 Anemia in chronic kidney disease: Secondary | ICD-10-CM | POA: Diagnosis not present

## 2016-01-08 DIAGNOSIS — N186 End stage renal disease: Secondary | ICD-10-CM | POA: Diagnosis not present

## 2016-01-08 DIAGNOSIS — D509 Iron deficiency anemia, unspecified: Secondary | ICD-10-CM | POA: Diagnosis not present

## 2016-01-10 DIAGNOSIS — D631 Anemia in chronic kidney disease: Secondary | ICD-10-CM | POA: Diagnosis not present

## 2016-01-10 DIAGNOSIS — N186 End stage renal disease: Secondary | ICD-10-CM | POA: Diagnosis not present

## 2016-01-10 DIAGNOSIS — D509 Iron deficiency anemia, unspecified: Secondary | ICD-10-CM | POA: Diagnosis not present

## 2016-01-10 DIAGNOSIS — Z992 Dependence on renal dialysis: Secondary | ICD-10-CM | POA: Diagnosis not present

## 2016-01-15 DIAGNOSIS — N186 End stage renal disease: Secondary | ICD-10-CM | POA: Diagnosis not present

## 2016-01-15 DIAGNOSIS — D631 Anemia in chronic kidney disease: Secondary | ICD-10-CM | POA: Diagnosis not present

## 2016-01-15 DIAGNOSIS — D509 Iron deficiency anemia, unspecified: Secondary | ICD-10-CM | POA: Diagnosis not present

## 2016-01-15 DIAGNOSIS — Z992 Dependence on renal dialysis: Secondary | ICD-10-CM | POA: Diagnosis not present

## 2016-01-17 DIAGNOSIS — D631 Anemia in chronic kidney disease: Secondary | ICD-10-CM | POA: Diagnosis not present

## 2016-01-17 DIAGNOSIS — Z992 Dependence on renal dialysis: Secondary | ICD-10-CM | POA: Diagnosis not present

## 2016-01-17 DIAGNOSIS — D509 Iron deficiency anemia, unspecified: Secondary | ICD-10-CM | POA: Diagnosis not present

## 2016-01-17 DIAGNOSIS — N186 End stage renal disease: Secondary | ICD-10-CM | POA: Diagnosis not present

## 2016-01-20 DIAGNOSIS — D631 Anemia in chronic kidney disease: Secondary | ICD-10-CM | POA: Diagnosis not present

## 2016-01-20 DIAGNOSIS — D509 Iron deficiency anemia, unspecified: Secondary | ICD-10-CM | POA: Diagnosis not present

## 2016-01-20 DIAGNOSIS — N186 End stage renal disease: Secondary | ICD-10-CM | POA: Diagnosis not present

## 2016-01-20 DIAGNOSIS — Z992 Dependence on renal dialysis: Secondary | ICD-10-CM | POA: Diagnosis not present

## 2016-01-22 DIAGNOSIS — Z992 Dependence on renal dialysis: Secondary | ICD-10-CM | POA: Diagnosis not present

## 2016-01-22 DIAGNOSIS — D509 Iron deficiency anemia, unspecified: Secondary | ICD-10-CM | POA: Diagnosis not present

## 2016-01-22 DIAGNOSIS — D631 Anemia in chronic kidney disease: Secondary | ICD-10-CM | POA: Diagnosis not present

## 2016-01-22 DIAGNOSIS — N186 End stage renal disease: Secondary | ICD-10-CM | POA: Diagnosis not present

## 2016-01-27 DIAGNOSIS — D631 Anemia in chronic kidney disease: Secondary | ICD-10-CM | POA: Diagnosis not present

## 2016-01-27 DIAGNOSIS — Z992 Dependence on renal dialysis: Secondary | ICD-10-CM | POA: Diagnosis not present

## 2016-01-27 DIAGNOSIS — D509 Iron deficiency anemia, unspecified: Secondary | ICD-10-CM | POA: Diagnosis not present

## 2016-01-27 DIAGNOSIS — N186 End stage renal disease: Secondary | ICD-10-CM | POA: Diagnosis not present

## 2016-01-30 DIAGNOSIS — R197 Diarrhea, unspecified: Secondary | ICD-10-CM | POA: Diagnosis not present

## 2016-01-30 DIAGNOSIS — G894 Chronic pain syndrome: Secondary | ICD-10-CM | POA: Diagnosis not present

## 2016-01-30 DIAGNOSIS — I10 Essential (primary) hypertension: Secondary | ICD-10-CM | POA: Diagnosis not present

## 2016-01-30 DIAGNOSIS — E119 Type 2 diabetes mellitus without complications: Secondary | ICD-10-CM | POA: Diagnosis not present

## 2016-01-30 DIAGNOSIS — N186 End stage renal disease: Secondary | ICD-10-CM | POA: Diagnosis not present

## 2016-01-31 DIAGNOSIS — D509 Iron deficiency anemia, unspecified: Secondary | ICD-10-CM | POA: Diagnosis not present

## 2016-01-31 DIAGNOSIS — N186 End stage renal disease: Secondary | ICD-10-CM | POA: Diagnosis not present

## 2016-01-31 DIAGNOSIS — Z992 Dependence on renal dialysis: Secondary | ICD-10-CM | POA: Diagnosis not present

## 2016-01-31 DIAGNOSIS — D631 Anemia in chronic kidney disease: Secondary | ICD-10-CM | POA: Diagnosis not present

## 2016-02-03 DIAGNOSIS — D509 Iron deficiency anemia, unspecified: Secondary | ICD-10-CM | POA: Diagnosis not present

## 2016-02-03 DIAGNOSIS — Z992 Dependence on renal dialysis: Secondary | ICD-10-CM | POA: Diagnosis not present

## 2016-02-03 DIAGNOSIS — D631 Anemia in chronic kidney disease: Secondary | ICD-10-CM | POA: Diagnosis not present

## 2016-02-03 DIAGNOSIS — N186 End stage renal disease: Secondary | ICD-10-CM | POA: Diagnosis not present

## 2016-02-05 DIAGNOSIS — N186 End stage renal disease: Secondary | ICD-10-CM | POA: Diagnosis not present

## 2016-02-05 DIAGNOSIS — D509 Iron deficiency anemia, unspecified: Secondary | ICD-10-CM | POA: Diagnosis not present

## 2016-02-05 DIAGNOSIS — D631 Anemia in chronic kidney disease: Secondary | ICD-10-CM | POA: Diagnosis not present

## 2016-02-05 DIAGNOSIS — Z992 Dependence on renal dialysis: Secondary | ICD-10-CM | POA: Diagnosis not present

## 2016-02-07 DIAGNOSIS — D509 Iron deficiency anemia, unspecified: Secondary | ICD-10-CM | POA: Diagnosis not present

## 2016-02-07 DIAGNOSIS — Z992 Dependence on renal dialysis: Secondary | ICD-10-CM | POA: Diagnosis not present

## 2016-02-07 DIAGNOSIS — N186 End stage renal disease: Secondary | ICD-10-CM | POA: Diagnosis not present

## 2016-02-07 DIAGNOSIS — D631 Anemia in chronic kidney disease: Secondary | ICD-10-CM | POA: Diagnosis not present

## 2016-02-07 DIAGNOSIS — E113512 Type 2 diabetes mellitus with proliferative diabetic retinopathy with macular edema, left eye: Secondary | ICD-10-CM | POA: Diagnosis not present

## 2016-02-07 DIAGNOSIS — H4311 Vitreous hemorrhage, right eye: Secondary | ICD-10-CM | POA: Diagnosis not present

## 2016-02-10 DIAGNOSIS — D631 Anemia in chronic kidney disease: Secondary | ICD-10-CM | POA: Diagnosis not present

## 2016-02-10 DIAGNOSIS — N186 End stage renal disease: Secondary | ICD-10-CM | POA: Diagnosis not present

## 2016-02-10 DIAGNOSIS — D509 Iron deficiency anemia, unspecified: Secondary | ICD-10-CM | POA: Diagnosis not present

## 2016-02-10 DIAGNOSIS — Z992 Dependence on renal dialysis: Secondary | ICD-10-CM | POA: Diagnosis not present

## 2016-02-12 DIAGNOSIS — N186 End stage renal disease: Secondary | ICD-10-CM | POA: Diagnosis not present

## 2016-02-12 DIAGNOSIS — Z992 Dependence on renal dialysis: Secondary | ICD-10-CM | POA: Diagnosis not present

## 2016-02-12 DIAGNOSIS — D631 Anemia in chronic kidney disease: Secondary | ICD-10-CM | POA: Diagnosis not present

## 2016-02-12 DIAGNOSIS — D509 Iron deficiency anemia, unspecified: Secondary | ICD-10-CM | POA: Diagnosis not present

## 2016-02-14 DIAGNOSIS — Z992 Dependence on renal dialysis: Secondary | ICD-10-CM | POA: Diagnosis not present

## 2016-02-14 DIAGNOSIS — D631 Anemia in chronic kidney disease: Secondary | ICD-10-CM | POA: Diagnosis not present

## 2016-02-14 DIAGNOSIS — D509 Iron deficiency anemia, unspecified: Secondary | ICD-10-CM | POA: Diagnosis not present

## 2016-02-14 DIAGNOSIS — N186 End stage renal disease: Secondary | ICD-10-CM | POA: Diagnosis not present

## 2016-02-17 DIAGNOSIS — D631 Anemia in chronic kidney disease: Secondary | ICD-10-CM | POA: Diagnosis not present

## 2016-02-17 DIAGNOSIS — N186 End stage renal disease: Secondary | ICD-10-CM | POA: Diagnosis not present

## 2016-02-17 DIAGNOSIS — D509 Iron deficiency anemia, unspecified: Secondary | ICD-10-CM | POA: Diagnosis not present

## 2016-02-17 DIAGNOSIS — Z992 Dependence on renal dialysis: Secondary | ICD-10-CM | POA: Diagnosis not present

## 2016-02-19 DIAGNOSIS — Z992 Dependence on renal dialysis: Secondary | ICD-10-CM | POA: Diagnosis not present

## 2016-02-19 DIAGNOSIS — N2581 Secondary hyperparathyroidism of renal origin: Secondary | ICD-10-CM | POA: Diagnosis not present

## 2016-02-19 DIAGNOSIS — N186 End stage renal disease: Secondary | ICD-10-CM | POA: Diagnosis not present

## 2016-02-19 DIAGNOSIS — T82858A Stenosis of vascular prosthetic devices, implants and grafts, initial encounter: Secondary | ICD-10-CM | POA: Diagnosis not present

## 2016-02-19 DIAGNOSIS — D631 Anemia in chronic kidney disease: Secondary | ICD-10-CM | POA: Diagnosis not present

## 2016-02-19 DIAGNOSIS — I871 Compression of vein: Secondary | ICD-10-CM | POA: Diagnosis not present

## 2016-02-19 DIAGNOSIS — D509 Iron deficiency anemia, unspecified: Secondary | ICD-10-CM | POA: Diagnosis not present

## 2016-02-21 DIAGNOSIS — N186 End stage renal disease: Secondary | ICD-10-CM | POA: Diagnosis not present

## 2016-02-21 DIAGNOSIS — D631 Anemia in chronic kidney disease: Secondary | ICD-10-CM | POA: Diagnosis not present

## 2016-02-21 DIAGNOSIS — Z992 Dependence on renal dialysis: Secondary | ICD-10-CM | POA: Diagnosis not present

## 2016-02-21 DIAGNOSIS — N2581 Secondary hyperparathyroidism of renal origin: Secondary | ICD-10-CM | POA: Diagnosis not present

## 2016-02-21 DIAGNOSIS — E113511 Type 2 diabetes mellitus with proliferative diabetic retinopathy with macular edema, right eye: Secondary | ICD-10-CM | POA: Diagnosis not present

## 2016-02-21 DIAGNOSIS — D509 Iron deficiency anemia, unspecified: Secondary | ICD-10-CM | POA: Diagnosis not present

## 2016-02-24 DIAGNOSIS — N186 End stage renal disease: Secondary | ICD-10-CM | POA: Diagnosis not present

## 2016-02-24 DIAGNOSIS — D631 Anemia in chronic kidney disease: Secondary | ICD-10-CM | POA: Diagnosis not present

## 2016-02-24 DIAGNOSIS — N2581 Secondary hyperparathyroidism of renal origin: Secondary | ICD-10-CM | POA: Diagnosis not present

## 2016-02-24 DIAGNOSIS — D509 Iron deficiency anemia, unspecified: Secondary | ICD-10-CM | POA: Diagnosis not present

## 2016-02-24 DIAGNOSIS — Z992 Dependence on renal dialysis: Secondary | ICD-10-CM | POA: Diagnosis not present

## 2016-02-26 DIAGNOSIS — Z992 Dependence on renal dialysis: Secondary | ICD-10-CM | POA: Diagnosis not present

## 2016-02-26 DIAGNOSIS — D509 Iron deficiency anemia, unspecified: Secondary | ICD-10-CM | POA: Diagnosis not present

## 2016-02-26 DIAGNOSIS — D631 Anemia in chronic kidney disease: Secondary | ICD-10-CM | POA: Diagnosis not present

## 2016-02-26 DIAGNOSIS — N186 End stage renal disease: Secondary | ICD-10-CM | POA: Diagnosis not present

## 2016-02-26 DIAGNOSIS — N2581 Secondary hyperparathyroidism of renal origin: Secondary | ICD-10-CM | POA: Diagnosis not present

## 2016-02-28 DIAGNOSIS — N2581 Secondary hyperparathyroidism of renal origin: Secondary | ICD-10-CM | POA: Diagnosis not present

## 2016-02-28 DIAGNOSIS — Z992 Dependence on renal dialysis: Secondary | ICD-10-CM | POA: Diagnosis not present

## 2016-02-28 DIAGNOSIS — D509 Iron deficiency anemia, unspecified: Secondary | ICD-10-CM | POA: Diagnosis not present

## 2016-02-28 DIAGNOSIS — D631 Anemia in chronic kidney disease: Secondary | ICD-10-CM | POA: Diagnosis not present

## 2016-02-28 DIAGNOSIS — N186 End stage renal disease: Secondary | ICD-10-CM | POA: Diagnosis not present

## 2016-03-02 DIAGNOSIS — D509 Iron deficiency anemia, unspecified: Secondary | ICD-10-CM | POA: Diagnosis not present

## 2016-03-02 DIAGNOSIS — D631 Anemia in chronic kidney disease: Secondary | ICD-10-CM | POA: Diagnosis not present

## 2016-03-02 DIAGNOSIS — Z992 Dependence on renal dialysis: Secondary | ICD-10-CM | POA: Diagnosis not present

## 2016-03-02 DIAGNOSIS — N2581 Secondary hyperparathyroidism of renal origin: Secondary | ICD-10-CM | POA: Diagnosis not present

## 2016-03-02 DIAGNOSIS — N186 End stage renal disease: Secondary | ICD-10-CM | POA: Diagnosis not present

## 2016-03-04 DIAGNOSIS — D631 Anemia in chronic kidney disease: Secondary | ICD-10-CM | POA: Diagnosis not present

## 2016-03-04 DIAGNOSIS — D509 Iron deficiency anemia, unspecified: Secondary | ICD-10-CM | POA: Diagnosis not present

## 2016-03-04 DIAGNOSIS — N186 End stage renal disease: Secondary | ICD-10-CM | POA: Diagnosis not present

## 2016-03-04 DIAGNOSIS — Z992 Dependence on renal dialysis: Secondary | ICD-10-CM | POA: Diagnosis not present

## 2016-03-04 DIAGNOSIS — N2581 Secondary hyperparathyroidism of renal origin: Secondary | ICD-10-CM | POA: Diagnosis not present

## 2016-03-06 DIAGNOSIS — D509 Iron deficiency anemia, unspecified: Secondary | ICD-10-CM | POA: Diagnosis not present

## 2016-03-06 DIAGNOSIS — N186 End stage renal disease: Secondary | ICD-10-CM | POA: Diagnosis not present

## 2016-03-06 DIAGNOSIS — D631 Anemia in chronic kidney disease: Secondary | ICD-10-CM | POA: Diagnosis not present

## 2016-03-06 DIAGNOSIS — N2581 Secondary hyperparathyroidism of renal origin: Secondary | ICD-10-CM | POA: Diagnosis not present

## 2016-03-06 DIAGNOSIS — Z992 Dependence on renal dialysis: Secondary | ICD-10-CM | POA: Diagnosis not present

## 2016-03-09 DIAGNOSIS — Z992 Dependence on renal dialysis: Secondary | ICD-10-CM | POA: Diagnosis not present

## 2016-03-09 DIAGNOSIS — D509 Iron deficiency anemia, unspecified: Secondary | ICD-10-CM | POA: Diagnosis not present

## 2016-03-09 DIAGNOSIS — D631 Anemia in chronic kidney disease: Secondary | ICD-10-CM | POA: Diagnosis not present

## 2016-03-09 DIAGNOSIS — N186 End stage renal disease: Secondary | ICD-10-CM | POA: Diagnosis not present

## 2016-03-09 DIAGNOSIS — N2581 Secondary hyperparathyroidism of renal origin: Secondary | ICD-10-CM | POA: Diagnosis not present

## 2016-03-11 DIAGNOSIS — N186 End stage renal disease: Secondary | ICD-10-CM | POA: Diagnosis not present

## 2016-03-11 DIAGNOSIS — D509 Iron deficiency anemia, unspecified: Secondary | ICD-10-CM | POA: Diagnosis not present

## 2016-03-11 DIAGNOSIS — Z992 Dependence on renal dialysis: Secondary | ICD-10-CM | POA: Diagnosis not present

## 2016-03-11 DIAGNOSIS — N2581 Secondary hyperparathyroidism of renal origin: Secondary | ICD-10-CM | POA: Diagnosis not present

## 2016-03-11 DIAGNOSIS — E113511 Type 2 diabetes mellitus with proliferative diabetic retinopathy with macular edema, right eye: Secondary | ICD-10-CM | POA: Diagnosis not present

## 2016-03-11 DIAGNOSIS — D631 Anemia in chronic kidney disease: Secondary | ICD-10-CM | POA: Diagnosis not present

## 2016-03-13 DIAGNOSIS — D509 Iron deficiency anemia, unspecified: Secondary | ICD-10-CM | POA: Diagnosis not present

## 2016-03-13 DIAGNOSIS — Z992 Dependence on renal dialysis: Secondary | ICD-10-CM | POA: Diagnosis not present

## 2016-03-13 DIAGNOSIS — N186 End stage renal disease: Secondary | ICD-10-CM | POA: Diagnosis not present

## 2016-03-13 DIAGNOSIS — N2581 Secondary hyperparathyroidism of renal origin: Secondary | ICD-10-CM | POA: Diagnosis not present

## 2016-03-13 DIAGNOSIS — D631 Anemia in chronic kidney disease: Secondary | ICD-10-CM | POA: Diagnosis not present

## 2016-03-16 DIAGNOSIS — N186 End stage renal disease: Secondary | ICD-10-CM | POA: Diagnosis not present

## 2016-03-16 DIAGNOSIS — N2581 Secondary hyperparathyroidism of renal origin: Secondary | ICD-10-CM | POA: Diagnosis not present

## 2016-03-16 DIAGNOSIS — D631 Anemia in chronic kidney disease: Secondary | ICD-10-CM | POA: Diagnosis not present

## 2016-03-16 DIAGNOSIS — D509 Iron deficiency anemia, unspecified: Secondary | ICD-10-CM | POA: Diagnosis not present

## 2016-03-16 DIAGNOSIS — Z992 Dependence on renal dialysis: Secondary | ICD-10-CM | POA: Diagnosis not present

## 2016-03-18 DIAGNOSIS — N186 End stage renal disease: Secondary | ICD-10-CM | POA: Diagnosis not present

## 2016-03-18 DIAGNOSIS — D509 Iron deficiency anemia, unspecified: Secondary | ICD-10-CM | POA: Diagnosis not present

## 2016-03-18 DIAGNOSIS — D631 Anemia in chronic kidney disease: Secondary | ICD-10-CM | POA: Diagnosis not present

## 2016-03-18 DIAGNOSIS — Z992 Dependence on renal dialysis: Secondary | ICD-10-CM | POA: Diagnosis not present

## 2016-03-18 DIAGNOSIS — N2581 Secondary hyperparathyroidism of renal origin: Secondary | ICD-10-CM | POA: Diagnosis not present

## 2016-03-19 DIAGNOSIS — K529 Noninfective gastroenteritis and colitis, unspecified: Secondary | ICD-10-CM | POA: Diagnosis not present

## 2016-03-19 DIAGNOSIS — R1310 Dysphagia, unspecified: Secondary | ICD-10-CM | POA: Diagnosis not present

## 2016-03-19 DIAGNOSIS — R079 Chest pain, unspecified: Secondary | ICD-10-CM | POA: Diagnosis not present

## 2016-03-19 DIAGNOSIS — N186 End stage renal disease: Secondary | ICD-10-CM | POA: Diagnosis not present

## 2016-03-19 DIAGNOSIS — B182 Chronic viral hepatitis C: Secondary | ICD-10-CM | POA: Diagnosis not present

## 2016-03-19 DIAGNOSIS — Z992 Dependence on renal dialysis: Secondary | ICD-10-CM | POA: Diagnosis not present

## 2016-03-20 DIAGNOSIS — N186 End stage renal disease: Secondary | ICD-10-CM | POA: Diagnosis not present

## 2016-03-20 DIAGNOSIS — E113512 Type 2 diabetes mellitus with proliferative diabetic retinopathy with macular edema, left eye: Secondary | ICD-10-CM | POA: Diagnosis not present

## 2016-03-20 DIAGNOSIS — Z992 Dependence on renal dialysis: Secondary | ICD-10-CM | POA: Diagnosis not present

## 2016-03-20 DIAGNOSIS — D509 Iron deficiency anemia, unspecified: Secondary | ICD-10-CM | POA: Diagnosis not present

## 2016-03-20 DIAGNOSIS — N2581 Secondary hyperparathyroidism of renal origin: Secondary | ICD-10-CM | POA: Diagnosis not present

## 2016-03-20 DIAGNOSIS — D631 Anemia in chronic kidney disease: Secondary | ICD-10-CM | POA: Diagnosis not present

## 2016-03-21 DIAGNOSIS — Z992 Dependence on renal dialysis: Secondary | ICD-10-CM | POA: Diagnosis not present

## 2016-03-21 DIAGNOSIS — N186 End stage renal disease: Secondary | ICD-10-CM | POA: Diagnosis not present

## 2016-03-23 DIAGNOSIS — N186 End stage renal disease: Secondary | ICD-10-CM | POA: Diagnosis not present

## 2016-03-23 DIAGNOSIS — D509 Iron deficiency anemia, unspecified: Secondary | ICD-10-CM | POA: Diagnosis not present

## 2016-03-23 DIAGNOSIS — Z992 Dependence on renal dialysis: Secondary | ICD-10-CM | POA: Diagnosis not present

## 2016-03-23 DIAGNOSIS — D631 Anemia in chronic kidney disease: Secondary | ICD-10-CM | POA: Diagnosis not present

## 2016-03-25 DIAGNOSIS — Z992 Dependence on renal dialysis: Secondary | ICD-10-CM | POA: Diagnosis not present

## 2016-03-25 DIAGNOSIS — N186 End stage renal disease: Secondary | ICD-10-CM | POA: Diagnosis not present

## 2016-03-25 DIAGNOSIS — D631 Anemia in chronic kidney disease: Secondary | ICD-10-CM | POA: Diagnosis not present

## 2016-03-25 DIAGNOSIS — D509 Iron deficiency anemia, unspecified: Secondary | ICD-10-CM | POA: Diagnosis not present

## 2016-03-27 DIAGNOSIS — E1122 Type 2 diabetes mellitus with diabetic chronic kidney disease: Secondary | ICD-10-CM | POA: Diagnosis not present

## 2016-03-27 DIAGNOSIS — E1140 Type 2 diabetes mellitus with diabetic neuropathy, unspecified: Secondary | ICD-10-CM | POA: Diagnosis not present

## 2016-03-27 DIAGNOSIS — N186 End stage renal disease: Secondary | ICD-10-CM | POA: Diagnosis not present

## 2016-03-27 DIAGNOSIS — M150 Primary generalized (osteo)arthritis: Secondary | ICD-10-CM | POA: Diagnosis not present

## 2016-03-27 DIAGNOSIS — E11319 Type 2 diabetes mellitus with unspecified diabetic retinopathy without macular edema: Secondary | ICD-10-CM | POA: Diagnosis not present

## 2016-03-27 DIAGNOSIS — D631 Anemia in chronic kidney disease: Secondary | ICD-10-CM | POA: Diagnosis not present

## 2016-03-27 DIAGNOSIS — Z794 Long term (current) use of insulin: Secondary | ICD-10-CM | POA: Diagnosis not present

## 2016-03-27 DIAGNOSIS — D509 Iron deficiency anemia, unspecified: Secondary | ICD-10-CM | POA: Diagnosis not present

## 2016-03-27 DIAGNOSIS — Z992 Dependence on renal dialysis: Secondary | ICD-10-CM | POA: Diagnosis not present

## 2016-03-30 DIAGNOSIS — D509 Iron deficiency anemia, unspecified: Secondary | ICD-10-CM | POA: Diagnosis not present

## 2016-03-30 DIAGNOSIS — N186 End stage renal disease: Secondary | ICD-10-CM | POA: Diagnosis not present

## 2016-03-30 DIAGNOSIS — Z992 Dependence on renal dialysis: Secondary | ICD-10-CM | POA: Diagnosis not present

## 2016-03-30 DIAGNOSIS — D631 Anemia in chronic kidney disease: Secondary | ICD-10-CM | POA: Diagnosis not present

## 2016-04-01 DIAGNOSIS — N186 End stage renal disease: Secondary | ICD-10-CM | POA: Diagnosis not present

## 2016-04-01 DIAGNOSIS — Z992 Dependence on renal dialysis: Secondary | ICD-10-CM | POA: Diagnosis not present

## 2016-04-01 DIAGNOSIS — D631 Anemia in chronic kidney disease: Secondary | ICD-10-CM | POA: Diagnosis not present

## 2016-04-01 DIAGNOSIS — D509 Iron deficiency anemia, unspecified: Secondary | ICD-10-CM | POA: Diagnosis not present

## 2016-04-03 DIAGNOSIS — N186 End stage renal disease: Secondary | ICD-10-CM | POA: Diagnosis not present

## 2016-04-03 DIAGNOSIS — D631 Anemia in chronic kidney disease: Secondary | ICD-10-CM | POA: Diagnosis not present

## 2016-04-03 DIAGNOSIS — D509 Iron deficiency anemia, unspecified: Secondary | ICD-10-CM | POA: Diagnosis not present

## 2016-04-03 DIAGNOSIS — Z992 Dependence on renal dialysis: Secondary | ICD-10-CM | POA: Diagnosis not present

## 2016-04-06 ENCOUNTER — Emergency Department
Admission: EM | Admit: 2016-04-06 | Discharge: 2016-04-06 | Disposition: A | Discharge status: Home / Self Care | Payer: Medicare Other | Attending: Emergency Medicine | Admitting: Emergency Medicine

## 2016-04-06 ENCOUNTER — Emergency Department: Payer: Medicare Other

## 2016-04-06 ENCOUNTER — Encounter: Payer: Self-pay | Admitting: Emergency Medicine

## 2016-04-06 DIAGNOSIS — D631 Anemia in chronic kidney disease: Secondary | ICD-10-CM | POA: Diagnosis not present

## 2016-04-06 DIAGNOSIS — Z79899 Other long term (current) drug therapy: Secondary | ICD-10-CM | POA: Diagnosis not present

## 2016-04-06 DIAGNOSIS — F17210 Nicotine dependence, cigarettes, uncomplicated: Secondary | ICD-10-CM | POA: Diagnosis not present

## 2016-04-06 DIAGNOSIS — E119 Type 2 diabetes mellitus without complications: Secondary | ICD-10-CM | POA: Insufficient documentation

## 2016-04-06 DIAGNOSIS — R0789 Other chest pain: Secondary | ICD-10-CM | POA: Diagnosis present

## 2016-04-06 DIAGNOSIS — N186 End stage renal disease: Secondary | ICD-10-CM | POA: Diagnosis not present

## 2016-04-06 DIAGNOSIS — Z7984 Long term (current) use of oral hypoglycemic drugs: Secondary | ICD-10-CM | POA: Insufficient documentation

## 2016-04-06 DIAGNOSIS — R079 Chest pain, unspecified: Secondary | ICD-10-CM | POA: Diagnosis not present

## 2016-04-06 DIAGNOSIS — I132 Hypertensive heart and chronic kidney disease with heart failure and with stage 5 chronic kidney disease, or end stage renal disease: Secondary | ICD-10-CM | POA: Diagnosis not present

## 2016-04-06 DIAGNOSIS — D509 Iron deficiency anemia, unspecified: Secondary | ICD-10-CM | POA: Diagnosis not present

## 2016-04-06 DIAGNOSIS — R05 Cough: Secondary | ICD-10-CM | POA: Diagnosis not present

## 2016-04-06 DIAGNOSIS — F329 Major depressive disorder, single episode, unspecified: Secondary | ICD-10-CM | POA: Insufficient documentation

## 2016-04-06 DIAGNOSIS — J449 Chronic obstructive pulmonary disease, unspecified: Secondary | ICD-10-CM | POA: Diagnosis not present

## 2016-04-06 DIAGNOSIS — Z794 Long term (current) use of insulin: Secondary | ICD-10-CM | POA: Insufficient documentation

## 2016-04-06 DIAGNOSIS — I509 Heart failure, unspecified: Secondary | ICD-10-CM | POA: Diagnosis not present

## 2016-04-06 DIAGNOSIS — J189 Pneumonia, unspecified organism: Secondary | ICD-10-CM | POA: Insufficient documentation

## 2016-04-06 DIAGNOSIS — Z992 Dependence on renal dialysis: Secondary | ICD-10-CM | POA: Diagnosis not present

## 2016-04-06 LAB — COMPREHENSIVE METABOLIC PANEL
ALK PHOS: 59 U/L (ref 38–126)
ALT: 23 U/L (ref 14–54)
ANION GAP: 9 (ref 5–15)
AST: 28 U/L (ref 15–41)
Albumin: 3.6 g/dL (ref 3.5–5.0)
BUN: 24 mg/dL — ABNORMAL HIGH (ref 6–20)
CALCIUM: 8.3 mg/dL — AB (ref 8.9–10.3)
CO2: 22 mmol/L (ref 22–32)
Chloride: 103 mmol/L (ref 101–111)
Creatinine, Ser: 3.48 mg/dL — ABNORMAL HIGH (ref 0.44–1.00)
GFR calc non Af Amer: 14 mL/min — ABNORMAL LOW (ref >60)
GFR, EST AFRICAN AMERICAN: 16 mL/min — AB (ref >60)
Glucose, Bld: 115 mg/dL — ABNORMAL HIGH (ref 65–99)
Potassium: 3.5 mmol/L (ref 3.5–5.1)
SODIUM: 134 mmol/L — AB (ref 135–145)
Total Bilirubin: 0.7 mg/dL (ref 0.3–1.2)
Total Protein: 8.3 g/dL — ABNORMAL HIGH (ref 6.5–8.1)

## 2016-04-06 LAB — CBC
HCT: 32.2 % — ABNORMAL LOW (ref 35.0–47.0)
HEMOGLOBIN: 10.8 g/dL — AB (ref 12.0–16.0)
MCH: 30.3 pg (ref 26.0–34.0)
MCHC: 33.4 g/dL (ref 32.0–36.0)
MCV: 90.6 fL (ref 80.0–100.0)
PLATELETS: 168 10*3/uL (ref 150–440)
RBC: 3.55 MIL/uL — ABNORMAL LOW (ref 3.80–5.20)
RDW: 15.6 % — ABNORMAL HIGH (ref 11.5–14.5)
WBC: 5.3 10*3/uL (ref 3.6–11.0)

## 2016-04-06 LAB — TROPONIN I: Troponin I: <0.03 ng/mL (ref <0.031)

## 2016-04-06 MED ORDER — LEVOFLOXACIN 750 MG PO TABS: 750.0000 mg | ORAL_TABLET | Freq: Every day | ORAL | Status: AC

## 2016-04-06 NOTE — ED Notes (Signed)
Pt arrived from dialysis via EMS for complaints of CP. Pt completed 2 hours of treatment. Pt reports 1.5 kg fluid pulled off. EMS reports NSR and 130/60

## 2016-04-06 NOTE — ED Provider Notes (Signed)
[facility] Emergency Department Provider Note  ____________________________________________    I have reviewed the triage vital signs and the nursing notes.   HISTORY  Chief Complaint Chest Pain    HPI [name] is a 50 y.o. female who presents with chest pain. Patient is a history of end-stage renal disease, she receives dialysis Monday Wednesday and Fridays. Today in the middle of dialysis she developed left-sided chest discomfort. She reports she has had this several times in the past and attributed to having fluid drawn off too quickly. Today the nurse opted to call EMS. Patient reports her pain has resolved en route to the ED. No shortness of breath. No fevers or chills.     Past Medical History  Diagnosis Date  . Hypertension   . CHF (congestive heart failure) ([place])   . COPD (chronic obstructive pulmonary disease) ([place])   . Diabetes mellitus without complication ([place])   . Depression   . Anxiety   . Chronic kidney disease   . Arthritis   . Anemia   . Hepatitis   . Tobacco dependence   . Emphysema of lung ([place])   . Hypercholesterolemia   . End stage renal disease ([place])     There are no active problems to display for this patient.   Past Surgical History  Procedure Laterality Date  . Cyst removed  from rt hand    . Tonsillectomy    . Appendectomy    . Rt. tubal and ovary removed    . Cholecystectomy    . Peripheral vascular catheterization N/A 07/25/2015    Procedure: A/V Shuntogram/Fistulagram;  Surgeon: Algernon Huxley, MD;  Location: [facility];  Service: Cardiovascular;  Laterality: N/A;  . Peripheral vascular catheterization Left 07/25/2015    Procedure: A/V Shunt Intervention;  Surgeon: Algernon Huxley, MD;  Location: [facility];  Service: Cardiovascular;  Laterality: Left;  . Peripheral vascular catheterization Left 10/07/2015    Procedure: A/V Shuntogram/Fistulagram;  Surgeon: Algernon Huxley, MD;  Location: [facility];  Service: Cardiovascular;  Laterality: Left;  . Peripheral vascular catheterization N/A 10/07/2015    Procedure: A/V Shunt Intervention;  Surgeon: Algernon Huxley, MD;  Location: [facility];  Service: Cardiovascular;  Laterality: N/A;  . Peripheral vascular catheterization  10/07/2015    Procedure: Dialysis/Perma Catheter Insertion;  Surgeon: Algernon Huxley, MD;  Location: [facility];  Service: Cardiovascular;;  . Peripheral vascular catheterization N/A 12/17/2015    Procedure: Dialysis/Perma Catheter Removal;  Surgeon: Katha Cabal, MD;  Location: [facility];  Service: Cardiovascular;  Laterality: N/A;    Current Outpatient Rx  Name  Route  Sig  Dispense  Refill  . amitriptyline (ELAVIL) 10 MG tablet   Oral   Take 5 mg by mouth at bedtime. Reported on 12/17/2015         . b complex-vitamin c-folic acid (NEPHRO-VITE) 0.8 MG TABS tablet   Oral   Take 1 tablet by mouth daily.         Marland Kitchen diltiazem (CARDIZEM SR) 120 MG 12 hr capsule   Oral   Take 120 mg by mouth 2 (two) times daily.         Marland Kitchen docusate sodium (COLACE) 100 MG capsule   Oral   Take 100 mg by mouth as needed for mild constipation.         . DULoxetine (CYMBALTA) 60 MG capsule   Oral  Take 1 capsule (60 mg total) by mouth daily. Patient not taking: Reported on 12/17/2015   30 capsule   0   . Fluticasone-Salmeterol (ADVAIR) 250-50 MCG/DOSE AEPB   Inhalation   Inhale 1 puff into the lungs 2 (two) times daily.         . hydrALAZINE (APRESOLINE) 100 MG tablet   Oral   Take 100 mg by mouth 2 (two) times daily.         . hydrOXYzine (ATARAX/VISTARIL) 25 MG tablet   Oral   Take 50 mg by mouth at bedtime.         . insulin glargine (LANTUS) 100 UNIT/ML injection   Subcutaneous   Inject 15 Units into the skin daily.         . insulin regular (NOVOLIN R,HUMULIN R) 100 units/mL injection   Subcutaneous   Inject 5-10 Units into the skin 3 (three) times daily  before meals. Pt uses per sliding scale.         . metoprolol tartrate (LOPRESSOR) 25 MG tablet   Oral   Take 25 mg by mouth 2 (two) times daily.         Marland Kitchen omeprazole (PRILOSEC) 20 MG capsule   Oral   Take 20 mg by mouth daily.         . Oxycodone HCl 10 MG TABS   Oral   Take 10 mg by mouth 4 (four) times daily as needed (for pain).         Marland Kitchen sevelamer carbonate (RENVELA) 800 MG tablet   Oral   Take 1,600 mg by mouth 3 (three) times daily with meals.         . triamcinolone cream (KENALOG) 0.1 %   Topical   Apply 1 application topically as needed (for psoriasis).         . venlafaxine XR (EFFEXOR-XR) 75 MG 24 hr capsule   Oral   Take 150 mg by mouth daily.           Allergies Cinnamon; Cinoxate; Tylenol; Garlic; and Onion  No family history on file.  Social History Social History  Substance Use Topics  . Smoking status: Current Every Day Smoker -- 0.50 packs/day for 17 years    Types: Cigarettes  . Smokeless tobacco: None  . Alcohol Use: No    Review of Systems  Constitutional: Negative for fever. Eyes: Negative for redness ENT: Negative forNeck pain Cardiovascular:Chest pain as above Respiratory: Negative for shortness of breath. Gastrointestinal: Negative for abdominal pain, no nausea or vomiting  Musculoskeletal: Negative for back pain. Skin: Negative for rash. Neurological: Negative for focal weakness Psychiatric: no anxiety    ____________________________________________   PHYSICAL EXAM:  VITAL SIGNS: ED Triage Vitals  Enc Vitals Group     BP 04/06/16 1408 149/76 mmHg     Pulse Rate 04/06/16 1408 69     Resp 04/06/16 1408 18     Temp 04/06/16 1408 98 F (36.7 C)     Temp Source 04/06/16 1408 Oral     SpO2 04/06/16 1408 97 %     Weight 04/06/16 1408 194 lb 0.1 oz (88 kg)     Height 04/06/16 1408 5\' 5"  (1.651 m)     Head Cir --      Peak Flow --      Pain Score 04/06/16 1408 0     Pain Loc --      Pain Edu? --      Excl.  in  GC? --      Constitutional: Alert and oriented. Well appearing and in no distress.  Eyes: Conjunctivae are normal. No erythema or injection ENT   Head: Normocephalic and atraumatic.   Mouth/Throat: Mucous membranes are moist. Cardiovascular: Normal rate, regular rhythm. Normal and symmetric distal pulses are present in the upper extremities. Respiratory: Normal respiratory effort without tachypnea nor retractions. Breath sounds are clear and equal bilaterally.  Gastrointestinal: Soft and non-tender in all quadrants. No distention. There is no CVA tenderness. Genitourinary: deferred Musculoskeletal: Nontender with normal range of motion in all extremities. No lower extremity tenderness nor edema. Neurologic:  Normal speech and language. No gross focal neurologic deficits are appreciated. Skin:  Skin is warm, dry and intact. No rash noted. Psychiatric: Mood and affect are normal. Patient exhibits appropriate insight and judgment.  ____________________________________________    LABS (pertinent positives/negatives)  Labs Reviewed  CBC - Abnormal; Notable for the following:    RBC 3.55 (*)    Hemoglobin 10.8 (*)    HCT 32.2 (*)    RDW 15.6 (*)    All other components within normal limits  COMPREHENSIVE METABOLIC PANEL  TROPONIN I    ____________________________________________   EKG   ED ECG REPORT I, Lavonia Drafts, the attending physician, personally viewed and interpreted this ECG.  Date: 04/06/2016 EKG Time: 2:12 PM Rate: 69 Rhythm: normal sinus rhythm QRS Axis: normal Intervals: normal ST/T Wave abnormalities: Nonspecific Conduction Disturbances: none                             ____________________________________________    RADIOLOGY  Chest x-ray concerning for pneumonia  ____________________________________________   PROCEDURES  Procedure(s) performed: none  Critical Care performed:  none  ____________________________________________   INITIAL IMPRESSION / ASSESSMENT AND PLAN / ED COURSE  Pertinent labs & imaging results that were available during my care of the patient were reviewed by me and considered in my medical decision making (see chart for details).  Patient well-appearing and in no distress. She has no chest pain the emergency department. EKG is nonspecific. Labs pending.  Chest x-ray suspicious for pneumonia. Normal white blood cell count and no fever, no hypoxia, no tachycardia. Patient feels well and is anxious to go home. She reports she will follow-up closely with her PCP and is to return to the emergency Department if any worsening of her symptoms.  ____________________________________________   FINAL CLINICAL IMPRESSION(S) / ED DIAGNOSES  Final diagnoses:  Community acquired pneumonia          Lavonia Drafts, MD 04/06/16 1513

## 2016-04-06 NOTE — ED Provider Notes (Signed)
In the process of the patient's discharge which was performed by Dr. Corky Downs. The nurse states that the patient describes some return of her chest pain. She currently has discharge paperwork and her evaluation here was negative. A repeat EKG was performed: ED ECG REPORT I, Daymon Larsen, the attending physician, personally viewed and interpreted this ECG.  Date: 04/06/2016 EKG Time: 1516 Rate: 70 Rhythm: normal sinus rhythm QRS Axis: normal Intervals: normal ST/T Wave abnormalities: Slight ST abnormality  Conduction Disturbances: none Narrative Interpretation: unremarkable No acute ischemic changes are noted No significant change  I advised the nurse to proceed with discharge and felt further evaluation was not necessary.  Daymon Larsen, MD 04/06/16 [phone]

## 2016-04-06 NOTE — ED Notes (Signed)
AAOx3.  Skin warm and dry. NAD.  No SOB/ DOE. 

## 2016-04-06 NOTE — ED Notes (Addendum)
Chest pain returned.  Repeat 12-lead EKG done.  Dr.Quigley alerted and to reviewed ekg, no change from previous EKG.  OK to discharge patient.

## 2016-04-06 NOTE — ED Notes (Signed)
Patient transported to X-ray 

## 2016-04-06 NOTE — Discharge Instructions (Signed)

## 2016-04-08 DIAGNOSIS — N186 End stage renal disease: Secondary | ICD-10-CM | POA: Diagnosis not present

## 2016-04-08 DIAGNOSIS — D631 Anemia in chronic kidney disease: Secondary | ICD-10-CM | POA: Diagnosis not present

## 2016-04-08 DIAGNOSIS — E1140 Type 2 diabetes mellitus with diabetic neuropathy, unspecified: Secondary | ICD-10-CM | POA: Diagnosis not present

## 2016-04-08 DIAGNOSIS — D509 Iron deficiency anemia, unspecified: Secondary | ICD-10-CM | POA: Diagnosis not present

## 2016-04-08 DIAGNOSIS — Z992 Dependence on renal dialysis: Secondary | ICD-10-CM | POA: Diagnosis not present

## 2016-04-08 DIAGNOSIS — E1122 Type 2 diabetes mellitus with diabetic chronic kidney disease: Secondary | ICD-10-CM | POA: Diagnosis not present

## 2016-04-08 DIAGNOSIS — M150 Primary generalized (osteo)arthritis: Secondary | ICD-10-CM | POA: Diagnosis not present

## 2016-04-08 DIAGNOSIS — E11319 Type 2 diabetes mellitus with unspecified diabetic retinopathy without macular edema: Secondary | ICD-10-CM | POA: Diagnosis not present

## 2016-04-10 DIAGNOSIS — E1122 Type 2 diabetes mellitus with diabetic chronic kidney disease: Secondary | ICD-10-CM | POA: Diagnosis not present

## 2016-04-10 DIAGNOSIS — E11319 Type 2 diabetes mellitus with unspecified diabetic retinopathy without macular edema: Secondary | ICD-10-CM | POA: Diagnosis not present

## 2016-04-10 DIAGNOSIS — E1140 Type 2 diabetes mellitus with diabetic neuropathy, unspecified: Secondary | ICD-10-CM | POA: Diagnosis not present

## 2016-04-10 DIAGNOSIS — N186 End stage renal disease: Secondary | ICD-10-CM | POA: Diagnosis not present

## 2016-04-10 DIAGNOSIS — Z992 Dependence on renal dialysis: Secondary | ICD-10-CM | POA: Diagnosis not present

## 2016-04-10 DIAGNOSIS — M150 Primary generalized (osteo)arthritis: Secondary | ICD-10-CM | POA: Diagnosis not present

## 2016-04-13 DIAGNOSIS — D631 Anemia in chronic kidney disease: Secondary | ICD-10-CM | POA: Diagnosis not present

## 2016-04-13 DIAGNOSIS — N186 End stage renal disease: Secondary | ICD-10-CM | POA: Diagnosis not present

## 2016-04-13 DIAGNOSIS — D509 Iron deficiency anemia, unspecified: Secondary | ICD-10-CM | POA: Diagnosis not present

## 2016-04-13 DIAGNOSIS — Z992 Dependence on renal dialysis: Secondary | ICD-10-CM | POA: Diagnosis not present

## 2016-04-15 DIAGNOSIS — Z992 Dependence on renal dialysis: Secondary | ICD-10-CM | POA: Diagnosis not present

## 2016-04-15 DIAGNOSIS — N186 End stage renal disease: Secondary | ICD-10-CM | POA: Diagnosis not present

## 2016-04-15 DIAGNOSIS — D631 Anemia in chronic kidney disease: Secondary | ICD-10-CM | POA: Diagnosis not present

## 2016-04-15 DIAGNOSIS — D509 Iron deficiency anemia, unspecified: Secondary | ICD-10-CM | POA: Diagnosis not present

## 2016-04-17 DIAGNOSIS — D509 Iron deficiency anemia, unspecified: Secondary | ICD-10-CM | POA: Diagnosis not present

## 2016-04-17 DIAGNOSIS — E113511 Type 2 diabetes mellitus with proliferative diabetic retinopathy with macular edema, right eye: Secondary | ICD-10-CM | POA: Diagnosis not present

## 2016-04-17 DIAGNOSIS — N186 End stage renal disease: Secondary | ICD-10-CM | POA: Diagnosis not present

## 2016-04-17 DIAGNOSIS — D631 Anemia in chronic kidney disease: Secondary | ICD-10-CM | POA: Diagnosis not present

## 2016-04-17 DIAGNOSIS — E113512 Type 2 diabetes mellitus with proliferative diabetic retinopathy with macular edema, left eye: Secondary | ICD-10-CM | POA: Diagnosis not present

## 2016-04-17 DIAGNOSIS — Z992 Dependence on renal dialysis: Secondary | ICD-10-CM | POA: Diagnosis not present

## 2016-04-20 DIAGNOSIS — Z992 Dependence on renal dialysis: Secondary | ICD-10-CM | POA: Diagnosis not present

## 2016-04-20 DIAGNOSIS — D631 Anemia in chronic kidney disease: Secondary | ICD-10-CM | POA: Diagnosis not present

## 2016-04-20 DIAGNOSIS — D509 Iron deficiency anemia, unspecified: Secondary | ICD-10-CM | POA: Diagnosis not present

## 2016-04-20 DIAGNOSIS — N186 End stage renal disease: Secondary | ICD-10-CM | POA: Diagnosis not present

## 2016-04-24 DIAGNOSIS — D509 Iron deficiency anemia, unspecified: Secondary | ICD-10-CM | POA: Diagnosis not present

## 2016-04-24 DIAGNOSIS — Z992 Dependence on renal dialysis: Secondary | ICD-10-CM | POA: Diagnosis not present

## 2016-04-24 DIAGNOSIS — N186 End stage renal disease: Secondary | ICD-10-CM | POA: Diagnosis not present

## 2016-04-24 DIAGNOSIS — D631 Anemia in chronic kidney disease: Secondary | ICD-10-CM | POA: Diagnosis not present

## 2016-04-27 DIAGNOSIS — R7309 Other abnormal glucose: Secondary | ICD-10-CM | POA: Diagnosis not present

## 2016-04-27 DIAGNOSIS — N950 Postmenopausal bleeding: Secondary | ICD-10-CM | POA: Diagnosis not present

## 2016-04-27 DIAGNOSIS — G894 Chronic pain syndrome: Secondary | ICD-10-CM | POA: Diagnosis not present

## 2016-04-27 DIAGNOSIS — E119 Type 2 diabetes mellitus without complications: Secondary | ICD-10-CM | POA: Diagnosis not present

## 2016-04-27 DIAGNOSIS — I10 Essential (primary) hypertension: Secondary | ICD-10-CM | POA: Diagnosis not present

## 2016-04-28 ENCOUNTER — Other Ambulatory Visit: Payer: Self-pay | Admitting: Obstetrics and Gynecology

## 2016-04-28 DIAGNOSIS — N950 Postmenopausal bleeding: Secondary | ICD-10-CM

## 2016-04-30 DIAGNOSIS — M150 Primary generalized (osteo)arthritis: Secondary | ICD-10-CM | POA: Diagnosis not present

## 2016-04-30 DIAGNOSIS — E1122 Type 2 diabetes mellitus with diabetic chronic kidney disease: Secondary | ICD-10-CM | POA: Diagnosis not present

## 2016-04-30 DIAGNOSIS — E1140 Type 2 diabetes mellitus with diabetic neuropathy, unspecified: Secondary | ICD-10-CM | POA: Diagnosis not present

## 2016-04-30 DIAGNOSIS — Z992 Dependence on renal dialysis: Secondary | ICD-10-CM | POA: Diagnosis not present

## 2016-04-30 DIAGNOSIS — E11319 Type 2 diabetes mellitus with unspecified diabetic retinopathy without macular edema: Secondary | ICD-10-CM | POA: Diagnosis not present

## 2016-04-30 DIAGNOSIS — N186 End stage renal disease: Secondary | ICD-10-CM | POA: Diagnosis not present

## 2016-05-01 DIAGNOSIS — D509 Iron deficiency anemia, unspecified: Secondary | ICD-10-CM | POA: Diagnosis not present

## 2016-05-01 DIAGNOSIS — E11319 Type 2 diabetes mellitus with unspecified diabetic retinopathy without macular edema: Secondary | ICD-10-CM | POA: Diagnosis not present

## 2016-05-01 DIAGNOSIS — D631 Anemia in chronic kidney disease: Secondary | ICD-10-CM | POA: Diagnosis not present

## 2016-05-01 DIAGNOSIS — N186 End stage renal disease: Secondary | ICD-10-CM | POA: Diagnosis not present

## 2016-05-01 DIAGNOSIS — M150 Primary generalized (osteo)arthritis: Secondary | ICD-10-CM | POA: Diagnosis not present

## 2016-05-01 DIAGNOSIS — E1140 Type 2 diabetes mellitus with diabetic neuropathy, unspecified: Secondary | ICD-10-CM | POA: Diagnosis not present

## 2016-05-01 DIAGNOSIS — E1122 Type 2 diabetes mellitus with diabetic chronic kidney disease: Secondary | ICD-10-CM | POA: Diagnosis not present

## 2016-05-01 DIAGNOSIS — Z992 Dependence on renal dialysis: Secondary | ICD-10-CM | POA: Diagnosis not present

## 2016-05-04 DIAGNOSIS — D631 Anemia in chronic kidney disease: Secondary | ICD-10-CM | POA: Diagnosis not present

## 2016-05-04 DIAGNOSIS — N186 End stage renal disease: Secondary | ICD-10-CM | POA: Diagnosis not present

## 2016-05-04 DIAGNOSIS — D509 Iron deficiency anemia, unspecified: Secondary | ICD-10-CM | POA: Diagnosis not present

## 2016-05-04 DIAGNOSIS — Z992 Dependence on renal dialysis: Secondary | ICD-10-CM | POA: Diagnosis not present

## 2016-05-05 DIAGNOSIS — Z992 Dependence on renal dialysis: Secondary | ICD-10-CM | POA: Diagnosis not present

## 2016-05-05 DIAGNOSIS — N186 End stage renal disease: Secondary | ICD-10-CM | POA: Diagnosis not present

## 2016-05-05 DIAGNOSIS — M150 Primary generalized (osteo)arthritis: Secondary | ICD-10-CM | POA: Diagnosis not present

## 2016-05-05 DIAGNOSIS — E11319 Type 2 diabetes mellitus with unspecified diabetic retinopathy without macular edema: Secondary | ICD-10-CM | POA: Diagnosis not present

## 2016-05-05 DIAGNOSIS — E1122 Type 2 diabetes mellitus with diabetic chronic kidney disease: Secondary | ICD-10-CM | POA: Diagnosis not present

## 2016-05-05 DIAGNOSIS — E1140 Type 2 diabetes mellitus with diabetic neuropathy, unspecified: Secondary | ICD-10-CM | POA: Diagnosis not present

## 2016-05-06 DIAGNOSIS — Z992 Dependence on renal dialysis: Secondary | ICD-10-CM | POA: Diagnosis not present

## 2016-05-06 DIAGNOSIS — N186 End stage renal disease: Secondary | ICD-10-CM | POA: Diagnosis not present

## 2016-05-06 DIAGNOSIS — D631 Anemia in chronic kidney disease: Secondary | ICD-10-CM | POA: Diagnosis not present

## 2016-05-06 DIAGNOSIS — D509 Iron deficiency anemia, unspecified: Secondary | ICD-10-CM | POA: Diagnosis not present

## 2016-05-07 ENCOUNTER — Other Ambulatory Visit: Payer: Self-pay | Admitting: Obstetrics and Gynecology

## 2016-05-07 DIAGNOSIS — E1122 Type 2 diabetes mellitus with diabetic chronic kidney disease: Secondary | ICD-10-CM | POA: Diagnosis not present

## 2016-05-07 DIAGNOSIS — M150 Primary generalized (osteo)arthritis: Secondary | ICD-10-CM | POA: Diagnosis not present

## 2016-05-07 DIAGNOSIS — N186 End stage renal disease: Secondary | ICD-10-CM | POA: Diagnosis not present

## 2016-05-07 DIAGNOSIS — N950 Postmenopausal bleeding: Secondary | ICD-10-CM

## 2016-05-07 DIAGNOSIS — Z992 Dependence on renal dialysis: Secondary | ICD-10-CM | POA: Diagnosis not present

## 2016-05-07 DIAGNOSIS — E1140 Type 2 diabetes mellitus with diabetic neuropathy, unspecified: Secondary | ICD-10-CM | POA: Diagnosis not present

## 2016-05-07 DIAGNOSIS — E11319 Type 2 diabetes mellitus with unspecified diabetic retinopathy without macular edema: Secondary | ICD-10-CM | POA: Diagnosis not present

## 2016-05-08 DIAGNOSIS — N186 End stage renal disease: Secondary | ICD-10-CM | POA: Diagnosis not present

## 2016-05-08 DIAGNOSIS — D631 Anemia in chronic kidney disease: Secondary | ICD-10-CM | POA: Diagnosis not present

## 2016-05-08 DIAGNOSIS — Z992 Dependence on renal dialysis: Secondary | ICD-10-CM | POA: Diagnosis not present

## 2016-05-08 DIAGNOSIS — D509 Iron deficiency anemia, unspecified: Secondary | ICD-10-CM | POA: Diagnosis not present

## 2016-05-11 ENCOUNTER — Emergency Department
Admission: EM | Admit: 2016-05-11 | Discharge: 2016-05-11 | Disposition: A | Discharge status: Home / Self Care | Payer: Medicare Other | Attending: Emergency Medicine | Admitting: Emergency Medicine

## 2016-05-11 DIAGNOSIS — F1410 Cocaine abuse, uncomplicated: Secondary | ICD-10-CM | POA: Insufficient documentation

## 2016-05-11 DIAGNOSIS — F4325 Adjustment disorder with mixed disturbance of emotions and conduct: Secondary | ICD-10-CM

## 2016-05-11 DIAGNOSIS — E1122 Type 2 diabetes mellitus with diabetic chronic kidney disease: Secondary | ICD-10-CM | POA: Diagnosis not present

## 2016-05-11 DIAGNOSIS — F918 Other conduct disorders: Secondary | ICD-10-CM | POA: Diagnosis not present

## 2016-05-11 DIAGNOSIS — N186 End stage renal disease: Secondary | ICD-10-CM | POA: Insufficient documentation

## 2016-05-11 DIAGNOSIS — I132 Hypertensive heart and chronic kidney disease with heart failure and with stage 5 chronic kidney disease, or end stage renal disease: Secondary | ICD-10-CM | POA: Insufficient documentation

## 2016-05-11 DIAGNOSIS — Z7951 Long term (current) use of inhaled steroids: Secondary | ICD-10-CM | POA: Insufficient documentation

## 2016-05-11 DIAGNOSIS — F329 Major depressive disorder, single episode, unspecified: Secondary | ICD-10-CM | POA: Diagnosis not present

## 2016-05-11 DIAGNOSIS — R45851 Suicidal ideations: Secondary | ICD-10-CM | POA: Diagnosis present

## 2016-05-11 DIAGNOSIS — Z992 Dependence on renal dialysis: Secondary | ICD-10-CM | POA: Insufficient documentation

## 2016-05-11 DIAGNOSIS — F17210 Nicotine dependence, cigarettes, uncomplicated: Secondary | ICD-10-CM | POA: Diagnosis not present

## 2016-05-11 DIAGNOSIS — I509 Heart failure, unspecified: Secondary | ICD-10-CM | POA: Diagnosis not present

## 2016-05-11 DIAGNOSIS — Z794 Long term (current) use of insulin: Secondary | ICD-10-CM | POA: Diagnosis not present

## 2016-05-11 DIAGNOSIS — Z79891 Long term (current) use of opiate analgesic: Secondary | ICD-10-CM | POA: Diagnosis not present

## 2016-05-11 DIAGNOSIS — Z79899 Other long term (current) drug therapy: Secondary | ICD-10-CM | POA: Diagnosis not present

## 2016-05-11 DIAGNOSIS — F341 Dysthymic disorder: Secondary | ICD-10-CM

## 2016-05-11 DIAGNOSIS — R4589 Other symptoms and signs involving emotional state: Secondary | ICD-10-CM

## 2016-05-11 DIAGNOSIS — R739 Hyperglycemia, unspecified: Secondary | ICD-10-CM

## 2016-05-11 LAB — URINE DRUG SCREEN, QUALITATIVE (ARMC ONLY)
Amphetamines, Ur Screen: NOT DETECTED
BARBITURATES, UR SCREEN: NOT DETECTED
BENZODIAZEPINE, UR SCRN: NOT DETECTED
CANNABINOID 50 NG, UR ~~LOC~~: NOT DETECTED
Cocaine Metabolite,Ur ~~LOC~~: POSITIVE — AB
MDMA (Ecstasy)Ur Screen: NOT DETECTED
METHADONE SCREEN, URINE: NOT DETECTED
OPIATE, UR SCREEN: NOT DETECTED
Phencyclidine (PCP) Ur S: NOT DETECTED
TRICYCLIC, UR SCREEN: POSITIVE — AB

## 2016-05-11 LAB — CBC WITH DIFFERENTIAL/PLATELET
BASOS PCT: 1 %
Basophils Absolute: 0 10*3/uL (ref 0–0.1)
EOS ABS: 0.1 10*3/uL (ref 0–0.7)
Eosinophils Relative: 2 %
HCT: 33.2 % — ABNORMAL LOW (ref 35.0–47.0)
Hemoglobin: 11.2 g/dL — ABNORMAL LOW (ref 12.0–16.0)
LYMPHS ABS: 1.3 10*3/uL (ref 1.0–3.6)
Lymphocytes Relative: 21 %
MCH: 31 pg (ref 26.0–34.0)
MCHC: 33.7 g/dL (ref 32.0–36.0)
MCV: 91.9 fL (ref 80.0–100.0)
MONO ABS: 0.4 10*3/uL (ref 0.2–0.9)
MONOS PCT: 7 %
Neutro Abs: 4.2 10*3/uL (ref 1.4–6.5)
Neutrophils Relative %: 69 %
Platelets: 175 10*3/uL (ref 150–440)
RBC: 3.61 MIL/uL — ABNORMAL LOW (ref 3.80–5.20)
RDW: 15.2 % — AB (ref 11.5–14.5)
WBC: 6.1 10*3/uL (ref 3.6–11.0)

## 2016-05-11 LAB — BASIC METABOLIC PANEL
Anion gap: 12 (ref 5–15)
BUN: 46 mg/dL — AB (ref 6–20)
CALCIUM: 8.1 mg/dL — AB (ref 8.9–10.3)
CO2: 22 mmol/L (ref 22–32)
CREATININE: 6.05 mg/dL — AB (ref 0.44–1.00)
Chloride: 100 mmol/L — ABNORMAL LOW (ref 101–111)
GFR calc Af Amer: 8 mL/min — ABNORMAL LOW (ref >60)
GFR calc non Af Amer: 7 mL/min — ABNORMAL LOW (ref >60)
GLUCOSE: 203 mg/dL — AB (ref 65–99)
Potassium: 4.3 mmol/L (ref 3.5–5.1)
Sodium: 134 mmol/L — ABNORMAL LOW (ref 135–145)

## 2016-05-11 MED ORDER — OXYCODONE HCL 5 MG PO TABS
10.0000 mg | ORAL_TABLET | Freq: Once | ORAL | Status: AC
  Administered 2016-05-11: 10 mg via ORAL
  Filled 2016-05-11: qty 2

## 2016-05-11 NOTE — ED Notes (Signed)
Computer downtime , see paper chart

## 2016-05-11 NOTE — ED Provider Notes (Signed)
-----------------------------------------   7:26 PM on 05/11/2016 -----------------------------------------   Blood pressure 159/68, pulse 84, temperature 98.1 F (36.7 C), temperature source Oral, resp. rate 18, SpO2 96 %.  The patient had no acute events since last update.  Calm and cooperative at this time.  Dr. Weber Cooks is seen and evaluated the patient and has cleared her for discharge. She missed her dialysis today. I spoke with Dr. Holley Raring of the nephrology service who recommended that she call her dialysis center tomorrow for make appointment. I explained this to the patient and she'll be calling the dialysis center tomorrow. She does not have any suicidal or homicidal ideation at this time.     Orbie Pyo, MD 05/11/16 [phone]
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
## 2016-05-13 DIAGNOSIS — D509 Iron deficiency anemia, unspecified: Secondary | ICD-10-CM | POA: Diagnosis not present

## 2016-05-13 DIAGNOSIS — N186 End stage renal disease: Secondary | ICD-10-CM | POA: Diagnosis not present

## 2016-05-13 DIAGNOSIS — D631 Anemia in chronic kidney disease: Secondary | ICD-10-CM | POA: Diagnosis not present

## 2016-05-13 DIAGNOSIS — Z992 Dependence on renal dialysis: Secondary | ICD-10-CM | POA: Diagnosis not present

## 2016-05-15 DIAGNOSIS — N186 End stage renal disease: Secondary | ICD-10-CM | POA: Diagnosis not present

## 2016-05-15 DIAGNOSIS — D509 Iron deficiency anemia, unspecified: Secondary | ICD-10-CM | POA: Diagnosis not present

## 2016-05-15 DIAGNOSIS — Z992 Dependence on renal dialysis: Secondary | ICD-10-CM | POA: Diagnosis not present

## 2016-05-15 DIAGNOSIS — D631 Anemia in chronic kidney disease: Secondary | ICD-10-CM | POA: Diagnosis not present

## 2016-05-18 DIAGNOSIS — D631 Anemia in chronic kidney disease: Secondary | ICD-10-CM | POA: Diagnosis not present

## 2016-05-18 DIAGNOSIS — Z992 Dependence on renal dialysis: Secondary | ICD-10-CM | POA: Diagnosis not present

## 2016-05-18 DIAGNOSIS — D509 Iron deficiency anemia, unspecified: Secondary | ICD-10-CM | POA: Diagnosis not present

## 2016-05-18 DIAGNOSIS — N186 End stage renal disease: Secondary | ICD-10-CM | POA: Diagnosis not present

## 2016-05-19 ENCOUNTER — Ambulatory Visit: Payer: Medicare Other

## 2016-05-20 DIAGNOSIS — D509 Iron deficiency anemia, unspecified: Secondary | ICD-10-CM | POA: Diagnosis not present

## 2016-05-20 DIAGNOSIS — N186 End stage renal disease: Secondary | ICD-10-CM | POA: Diagnosis not present

## 2016-05-20 DIAGNOSIS — D631 Anemia in chronic kidney disease: Secondary | ICD-10-CM | POA: Diagnosis not present

## 2016-05-20 DIAGNOSIS — Z992 Dependence on renal dialysis: Secondary | ICD-10-CM | POA: Diagnosis not present

## 2016-05-22 DIAGNOSIS — N2581 Secondary hyperparathyroidism of renal origin: Secondary | ICD-10-CM | POA: Diagnosis not present

## 2016-05-22 DIAGNOSIS — D631 Anemia in chronic kidney disease: Secondary | ICD-10-CM | POA: Diagnosis not present

## 2016-05-22 DIAGNOSIS — D509 Iron deficiency anemia, unspecified: Secondary | ICD-10-CM | POA: Diagnosis not present

## 2016-05-22 DIAGNOSIS — N186 End stage renal disease: Secondary | ICD-10-CM | POA: Diagnosis not present

## 2016-05-22 DIAGNOSIS — Z992 Dependence on renal dialysis: Secondary | ICD-10-CM | POA: Diagnosis not present

## 2016-05-25 DIAGNOSIS — N2581 Secondary hyperparathyroidism of renal origin: Secondary | ICD-10-CM | POA: Diagnosis not present

## 2016-05-25 DIAGNOSIS — D509 Iron deficiency anemia, unspecified: Secondary | ICD-10-CM | POA: Diagnosis not present

## 2016-05-25 DIAGNOSIS — D631 Anemia in chronic kidney disease: Secondary | ICD-10-CM | POA: Diagnosis not present

## 2016-05-25 DIAGNOSIS — N186 End stage renal disease: Secondary | ICD-10-CM | POA: Diagnosis not present

## 2016-05-25 DIAGNOSIS — Z992 Dependence on renal dialysis: Secondary | ICD-10-CM | POA: Diagnosis not present

## 2016-05-27 DIAGNOSIS — Z992 Dependence on renal dialysis: Secondary | ICD-10-CM | POA: Diagnosis not present

## 2016-05-27 DIAGNOSIS — N2581 Secondary hyperparathyroidism of renal origin: Secondary | ICD-10-CM | POA: Diagnosis not present

## 2016-05-27 DIAGNOSIS — D509 Iron deficiency anemia, unspecified: Secondary | ICD-10-CM | POA: Diagnosis not present

## 2016-05-27 DIAGNOSIS — N186 End stage renal disease: Secondary | ICD-10-CM | POA: Diagnosis not present

## 2016-05-27 DIAGNOSIS — D631 Anemia in chronic kidney disease: Secondary | ICD-10-CM | POA: Diagnosis not present

## 2016-05-29 DIAGNOSIS — N2581 Secondary hyperparathyroidism of renal origin: Secondary | ICD-10-CM | POA: Diagnosis not present

## 2016-05-29 DIAGNOSIS — N186 End stage renal disease: Secondary | ICD-10-CM | POA: Diagnosis not present

## 2016-05-29 DIAGNOSIS — D631 Anemia in chronic kidney disease: Secondary | ICD-10-CM | POA: Diagnosis not present

## 2016-05-29 DIAGNOSIS — Z992 Dependence on renal dialysis: Secondary | ICD-10-CM | POA: Diagnosis not present

## 2016-05-29 DIAGNOSIS — D509 Iron deficiency anemia, unspecified: Secondary | ICD-10-CM | POA: Diagnosis not present

## 2016-05-31 ENCOUNTER — Emergency Department
Admission: EM | Admit: 2016-05-31 | Discharge: 2016-05-31 | Disposition: A | Discharge status: Home / Self Care | Payer: Medicare Other | Attending: Emergency Medicine | Admitting: Emergency Medicine

## 2016-05-31 ENCOUNTER — Encounter: Payer: Self-pay | Admitting: Emergency Medicine

## 2016-05-31 DIAGNOSIS — Y92481 Parking lot as the place of occurrence of the external cause: Secondary | ICD-10-CM | POA: Insufficient documentation

## 2016-05-31 DIAGNOSIS — Y9301 Activity, walking, marching and hiking: Secondary | ICD-10-CM | POA: Insufficient documentation

## 2016-05-31 DIAGNOSIS — F17210 Nicotine dependence, cigarettes, uncomplicated: Secondary | ICD-10-CM | POA: Insufficient documentation

## 2016-05-31 DIAGNOSIS — Z794 Long term (current) use of insulin: Secondary | ICD-10-CM | POA: Insufficient documentation

## 2016-05-31 DIAGNOSIS — T25221A Burn of second degree of right foot, initial encounter: Secondary | ICD-10-CM | POA: Diagnosis not present

## 2016-05-31 DIAGNOSIS — E1122 Type 2 diabetes mellitus with diabetic chronic kidney disease: Secondary | ICD-10-CM | POA: Diagnosis not present

## 2016-05-31 DIAGNOSIS — I509 Heart failure, unspecified: Secondary | ICD-10-CM | POA: Diagnosis not present

## 2016-05-31 DIAGNOSIS — Y999 Unspecified external cause status: Secondary | ICD-10-CM | POA: Insufficient documentation

## 2016-05-31 DIAGNOSIS — F329 Major depressive disorder, single episode, unspecified: Secondary | ICD-10-CM | POA: Insufficient documentation

## 2016-05-31 DIAGNOSIS — T25222A Burn of second degree of left foot, initial encounter: Secondary | ICD-10-CM | POA: Diagnosis not present

## 2016-05-31 DIAGNOSIS — S90822A Blister (nonthermal), left foot, initial encounter: Secondary | ICD-10-CM

## 2016-05-31 DIAGNOSIS — I132 Hypertensive heart and chronic kidney disease with heart failure and with stage 5 chronic kidney disease, or end stage renal disease: Secondary | ICD-10-CM | POA: Insufficient documentation

## 2016-05-31 DIAGNOSIS — Z79899 Other long term (current) drug therapy: Secondary | ICD-10-CM | POA: Diagnosis not present

## 2016-05-31 DIAGNOSIS — S99921A Unspecified injury of right foot, initial encounter: Secondary | ICD-10-CM | POA: Diagnosis present

## 2016-05-31 DIAGNOSIS — X30XXXA Exposure to excessive natural heat, initial encounter: Secondary | ICD-10-CM | POA: Insufficient documentation

## 2016-05-31 DIAGNOSIS — S90821A Blister (nonthermal), right foot, initial encounter: Secondary | ICD-10-CM

## 2016-05-31 DIAGNOSIS — N186 End stage renal disease: Secondary | ICD-10-CM | POA: Insufficient documentation

## 2016-05-31 DIAGNOSIS — M1990 Unspecified osteoarthritis, unspecified site: Secondary | ICD-10-CM | POA: Insufficient documentation

## 2016-05-31 MED ORDER — SILVER SULFADIAZINE 1 % EX CREA: TOPICAL_CREAM | CUTANEOUS | Status: AC

## 2016-05-31 MED ORDER — TRAMADOL HCL 50 MG PO TABS
ORAL_TABLET | ORAL | Status: AC
  Administered 2016-05-31: 50 mg via ORAL
  Filled 2016-05-31: qty 1

## 2016-05-31 MED ORDER — BACITRACIN ZINC 500 UNIT/GM EX OINT
TOPICAL_OINTMENT | Freq: Every day | CUTANEOUS | Status: DC
  Administered 2016-05-31: 1 via TOPICAL
  Filled 2016-05-31: qty 0.9

## 2016-05-31 MED ORDER — TRAMADOL HCL 50 MG PO TABS: 50.0000 mg | ORAL_TABLET | Freq: Two times a day (BID) | ORAL | Status: DC

## 2016-05-31 MED ORDER — TRAMADOL HCL 50 MG PO TABS
50.0000 mg | ORAL_TABLET | Freq: Once | ORAL | Status: AC
  Administered 2016-05-31: 50 mg via ORAL

## 2016-05-31 NOTE — ED Provider Notes (Signed)
[facility] Emergency Department Provider Note ____________________________________________  Time seen: [id number]  I have reviewed the triage vital signs and the nursing notes.  HISTORY  Chief Complaint  Foot Burn  HPI [name] is a 50 y.o. female presents to the ED for evaluation for blisters to the bottom of the feet. She describes walking across the black[place] parking lot of her hotel to the car, barefoot. She is a known diabetic with neuropathy,ESRD, HTN, COPD and CHF. She claims she did not initially realize the parking lot was that hot and that her feet had peeled. She rates her pain at 10/10 in triage.   Past Medical History  Diagnosis Date  . Hypertension   . CHF (congestive heart failure) ([place])   . COPD (chronic obstructive pulmonary disease) ([place])   . Diabetes mellitus without complication ([place])   . Depression   . Anxiety   . Chronic kidney disease   . Arthritis   . Anemia   . Hepatitis   . Tobacco dependence   . Emphysema of lung ([place])   . Hypercholesterolemia   . End stage renal disease [facility])     Patient Active Problem List   Diagnosis Date Noted  . Adjustment disorder with mixed disturbance of emotions and conduct 05/11/2016  . Dysthymia 05/11/2016    Past Surgical History  Procedure Laterality Date  . Cyst removed  from rt hand    . Tonsillectomy    . Rt. tubal and ovary removed    . Cholecystectomy    . Peripheral vascular catheterization N/A 07/25/2015    Procedure: A/V Shuntogram/Fistulagram;  Surgeon: Algernon Huxley, MD;  Location: [place] [facility];  Service: Cardiovascular;  Laterality: N/A;  . Peripheral vascular catheterization Left 07/25/2015    Procedure: A/V Shunt Intervention;  Surgeon: Algernon Huxley, MD;  Location: [place] [facility];  Service: Cardiovascular;  Laterality: Left;  . Peripheral vascular catheterization Left 10/07/2015    Procedure: A/V Shuntogram/Fistulagram;  Surgeon: Algernon Huxley, MD;  Location: [place] [facility];  Service: Cardiovascular;  Laterality: Left;  . Peripheral vascular catheterization N/A 10/07/2015    Procedure: A/V Shunt Intervention;  Surgeon: Algernon Huxley, MD;  Location: [place] [facility];  Service: Cardiovascular;  Laterality: N/A;  . Peripheral vascular catheterization  10/07/2015    Procedure: Dialysis/Perma Catheter Insertion;  Surgeon: Algernon Huxley, MD;  Location: [place] [facility];  Service: Cardiovascular;;  . Peripheral vascular catheterization N/A 12/17/2015    Procedure: Dialysis/Perma Catheter Removal;  Surgeon: Katha Cabal, MD;  Location: [place] [facility];  Service: Cardiovascular;  Laterality: N/A;    Current Outpatient Rx  Name  Route  Sig  Dispense  Refill  . amitriptyline (ELAVIL) 10 MG tablet   Oral   Take 5 mg by mouth at bedtime. Reported on 12/17/2015         . b complex-vitamin c-folic acid (NEPHRO-VITE) 0.8 MG TABS tablet   Oral   Take 1 tablet by mouth daily.         Marland Kitchen diltiazem (CARDIZEM SR) 120 MG 12 hr capsule   Oral   Take 120 mg by mouth 2 (two) times daily.         Marland Kitchen docusate sodium (COLACE) 100 MG capsule   Oral   Take 100 mg by mouth as needed for mild constipation.         . DULoxetine (CYMBALTA) 60 MG capsule   Oral   Take  1 capsule (60 mg total) by mouth daily. Patient not taking: Reported on 12/17/2015   30 capsule   0   . Fluticasone-Salmeterol (ADVAIR) 250-50 MCG/DOSE AEPB   Inhalation   Inhale 1 puff into the lungs 2 (two) times daily.         . hydrALAZINE (APRESOLINE) 100 MG tablet   Oral   Take 100 mg by mouth 2 (two) times daily.         . hydrOXYzine (ATARAX/VISTARIL) 25 MG tablet   Oral   Take 50 mg by mouth at bedtime.         . insulin glargine (LANTUS) 100 UNIT/ML injection   Subcutaneous   Inject 15 Units into the skin daily.         . insulin regular (NOVOLIN R,HUMULIN R) 100 units/mL injection   Subcutaneous   Inject 5-10 Units into the skin 3 (three) times daily  before meals. Pt uses per sliding scale.         . metoprolol tartrate (LOPRESSOR) 25 MG tablet   Oral   Take 25 mg by mouth 2 (two) times daily.         Marland Kitchen omeprazole (PRILOSEC) 20 MG capsule   Oral   Take 20 mg by mouth daily.         . Oxycodone HCl 10 MG TABS   Oral   Take 10 mg by mouth 4 (four) times daily as needed (for pain).         Marland Kitchen sevelamer carbonate (RENVELA) 800 MG tablet   Oral   Take 1,600 mg by mouth 3 (three) times daily with meals.         . silver sulfADIAZINE (SILVADENE) 1 % cream      Apply to affected area daily   50 g   0   . traMADol (ULTRAM) 50 MG tablet   Oral   Take 1 tablet (50 mg total) by mouth 2 (two) times daily.   10 tablet   0   . triamcinolone cream (KENALOG) 0.1 %   Topical   Apply 1 application topically as needed (for psoriasis).         . venlafaxine XR (EFFEXOR-XR) 75 MG 24 hr capsule   Oral   Take 150 mg by mouth daily.           Allergies Cinnamon; Cinoxate; Tylenol; Garlic; and Onion  History reviewed. No pertinent family history.  Social History Social History  Substance Use Topics  . Smoking status: Current Every Day Smoker -- 0.50 packs/day for 17 years    Types: Cigarettes  . Smokeless tobacco: None  . Alcohol Use: No   Review of Systems  Constitutional: Negative for fever. Respiratory: Negative for shortness of breath. Gastrointestinal: Negative for abdominal pain, vomiting and diarrhea. Musculoskeletal: Negative for back pain. Skin: Negative for rash. Bilateral plantar surface foot blisters as above. Neurological: Negative for headaches, focal weakness or numbness. ____________________________________________  PHYSICAL EXAM:  VITAL SIGNS: ED Triage Vitals  Enc Vitals Group     BP 05/31/16 1631 164/84 mmHg     Pulse Rate 05/31/16 1631 88     Resp 05/31/16 1631 18     Temp 05/31/16 1631 98.3 F (36.8 C)     Temp Source 05/31/16 1631 Oral     SpO2 05/31/16 1631 100 %     Weight  05/31/16 1631 191 lb 12.8 oz (87 kg)     Height 05/31/16 1631 5\' 5"  (1.651 m)  Head Cir --      Peak Flow --      Pain Score 05/31/16 1631 10     Pain Loc --      Pain Edu? --      Excl. in [place]? --    Constitutional: Alert and oriented. Well appearing and in no distress. Head: Normocephalic and atraumatic. Cardiovascular: Normal distal pulses and capillary refill.   Respiratory: Normal respiratory effort.  Musculoskeletal: Nontender with normal range of motion in all extremities.  Neurologic:  Antalgic gait without ataxia. Normal speech and language. No gross focal neurologic deficits are appreciated. Skin:  Skin is warm, dry and intact. No rash noted. The plantar surface of the left foot is notable for a large superficial blister bed with blister cap absent, over the MCPs of the forefoot. A scant amount of serous drainage is noted. No significant erythema, induration, purulence is noted. A similar, non-intact blister is noted to the right foot plantar surface.  ____________________________________________  PROCEDURES  Bacitracin ointment Wound dressing Ultram 50 mg PO ____________________________________________  INITIAL IMPRESSION / ASSESSMENT AND PLAN / ED COURSE  Patient with a non-intact, superficial blister without infection to the plantar surface of the left foot and an intact blister to the plantar surface of the right foot. Both blister are cleansed and dressed with sterile gauze and about ointment. The patient is discharged with additional supplies for home care. She is also provided with a prescription for Silvadene and #10 Ultram for pain. She will follow with primary care provider in 2-3 days for wound check as needed. ____________________________________________  FINAL CLINICAL IMPRESSION(S) / ED DIAGNOSES  Final diagnoses:  Blister of foot without infection, left, initial encounter  Blister of foot without infection, right, initial encounter     Melvenia Needles, PA-C 05/31/16 1820  Delman Kitten, MD 05/31/16 [place]
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
## 2016-05-31 NOTE — Discharge Instructions (Signed)
Blisters A blister is a fluid-filled sac that forms between layers of skin. Blisters often form in areas where skin rubs against other skin or rubs against something else. The most common areas for blisters are the hands and feet. CAUSES A blister can be caused by:  An injury.  A burn.  An allergic reaction.  An infection.  Exposure to irritating chemicals.  Friction. Friction blisters often result from:  Sports.  Repetitive activities.  Shoes that are too tight or too loose. SIGNS AND SYMPTOMS A blister is often round and looks like a bump. It may itch or be painful to the touch. The liquid in a blister is clear or bloody. Before a blister forms, the skin may become red, feel warm, itch, or be painful to the touch. DIAGNOSIS A blister can usually be diagnosed from its appearance. TREATMENT Treatment involves protecting the area where the blister has formed until the skin has healed. If something is likely to rub against the blister, apply a bandage (dressing) with a hole in the middle over the blister. Most blisters break open, dry up, and go away on their own within 10 days. Rarely, blisters that are very painful may be drained before they break open on their own. Draining of a blister should only be done by a health care provider under sterile conditions. HOME CARE INSTRUCTIONS  Protect the area where the blister has formed as directed by your health care provider.  Do not open or pop your blister, because it could become infected.  If the blister is very painful, ask your health care provider whether you should have it drained.  If the blister breaks open on its own:  Do not remove the loose skin that is over the blister.  Wash the blister area with soap and water every day.  After washing the blister area, you may apply an antibiotic cream or ointment and cover the area with a bandage. PREVENTION Taking these steps can help to prevent blisters that are caused by  friction:  Wear comfortable shoes that fit well.  Always wear socks with shoes.  Wear extra socks or use tape, bandages, or pads over blister-prone areas as needed.  Wear protective gear, such as gloves, when participating in sports or activities that can cause blisters.  Use powders as needed to keep your feet dry. SEEK MEDICAL CARE IF:  You have increased redness, swelling, or pain in the blister area.  A puslike discharge is coming from the blister area.  You have a fever.  You have chills.   This information is not intended to replace advice given to you by your health care provider. Make sure you discuss any questions you have with your health care provider.   Document Released: 01/14/2005 Document Revised: 12/28/2014 Document Reviewed: 07/07/2014 Elsevier Interactive Patient Education 2016 [place] the dressings daily. Keep the wound and dressing clean and dry. Follow-up with your provider in 3-5 days for wound check.

## 2016-06-03 DIAGNOSIS — Z992 Dependence on renal dialysis: Secondary | ICD-10-CM | POA: Diagnosis not present

## 2016-06-03 DIAGNOSIS — D509 Iron deficiency anemia, unspecified: Secondary | ICD-10-CM | POA: Diagnosis not present

## 2016-06-03 DIAGNOSIS — N2581 Secondary hyperparathyroidism of renal origin: Secondary | ICD-10-CM | POA: Diagnosis not present

## 2016-06-03 DIAGNOSIS — D631 Anemia in chronic kidney disease: Secondary | ICD-10-CM | POA: Diagnosis not present

## 2016-06-03 DIAGNOSIS — N186 End stage renal disease: Secondary | ICD-10-CM | POA: Diagnosis not present

## 2016-06-05 DIAGNOSIS — Z992 Dependence on renal dialysis: Secondary | ICD-10-CM | POA: Diagnosis not present

## 2016-06-05 DIAGNOSIS — D509 Iron deficiency anemia, unspecified: Secondary | ICD-10-CM | POA: Diagnosis not present

## 2016-06-05 DIAGNOSIS — N186 End stage renal disease: Secondary | ICD-10-CM | POA: Diagnosis not present

## 2016-06-05 DIAGNOSIS — D631 Anemia in chronic kidney disease: Secondary | ICD-10-CM | POA: Diagnosis not present

## 2016-06-05 DIAGNOSIS — N2581 Secondary hyperparathyroidism of renal origin: Secondary | ICD-10-CM | POA: Diagnosis not present

## 2016-06-06 ENCOUNTER — Emergency Department: Payer: Medicare Other

## 2016-06-06 ENCOUNTER — Encounter: Payer: Self-pay | Admitting: Emergency Medicine

## 2016-06-06 ENCOUNTER — Inpatient Hospital Stay
Admission: EM | Admit: 2016-06-06 | Discharge: 2016-06-10 | DRG: 602 | Discharge status: Against Medical Advice | Payer: Medicare Other | Attending: Internal Medicine | Admitting: Internal Medicine

## 2016-06-06 DIAGNOSIS — I120 Hypertensive chronic kidney disease with stage 5 chronic kidney disease or end stage renal disease: Secondary | ICD-10-CM | POA: Diagnosis not present

## 2016-06-06 DIAGNOSIS — R197 Diarrhea, unspecified: Secondary | ICD-10-CM | POA: Diagnosis present

## 2016-06-06 DIAGNOSIS — I132 Hypertensive heart and chronic kidney disease with heart failure and with stage 5 chronic kidney disease, or end stage renal disease: Secondary | ICD-10-CM | POA: Diagnosis present

## 2016-06-06 DIAGNOSIS — D631 Anemia in chronic kidney disease: Secondary | ICD-10-CM | POA: Diagnosis not present

## 2016-06-06 DIAGNOSIS — N2581 Secondary hyperparathyroidism of renal origin: Secondary | ICD-10-CM | POA: Diagnosis present

## 2016-06-06 DIAGNOSIS — E1142 Type 2 diabetes mellitus with diabetic polyneuropathy: Secondary | ICD-10-CM | POA: Diagnosis present

## 2016-06-06 DIAGNOSIS — Z9049 Acquired absence of other specified parts of digestive tract: Secondary | ICD-10-CM | POA: Diagnosis not present

## 2016-06-06 DIAGNOSIS — Z8249 Family history of ischemic heart disease and other diseases of the circulatory system: Secondary | ICD-10-CM | POA: Diagnosis not present

## 2016-06-06 DIAGNOSIS — J449 Chronic obstructive pulmonary disease, unspecified: Secondary | ICD-10-CM | POA: Diagnosis present

## 2016-06-06 DIAGNOSIS — S90822A Blister (nonthermal), left foot, initial encounter: Secondary | ICD-10-CM | POA: Diagnosis present

## 2016-06-06 DIAGNOSIS — N186 End stage renal disease: Secondary | ICD-10-CM | POA: Diagnosis present

## 2016-06-06 DIAGNOSIS — S92355A Nondisplaced fracture of fifth metatarsal bone, left foot, initial encounter for closed fracture: Secondary | ICD-10-CM | POA: Diagnosis not present

## 2016-06-06 DIAGNOSIS — Z886 Allergy status to analgesic agent status: Secondary | ICD-10-CM | POA: Diagnosis not present

## 2016-06-06 DIAGNOSIS — Z794 Long term (current) use of insulin: Secondary | ICD-10-CM

## 2016-06-06 DIAGNOSIS — T25222D Burn of second degree of left foot, subsequent encounter: Secondary | ICD-10-CM

## 2016-06-06 DIAGNOSIS — I10 Essential (primary) hypertension: Secondary | ICD-10-CM | POA: Diagnosis not present

## 2016-06-06 DIAGNOSIS — L03116 Cellulitis of left lower limb: Secondary | ICD-10-CM | POA: Diagnosis not present

## 2016-06-06 DIAGNOSIS — R079 Chest pain, unspecified: Secondary | ICD-10-CM | POA: Diagnosis not present

## 2016-06-06 DIAGNOSIS — F329 Major depressive disorder, single episode, unspecified: Secondary | ICD-10-CM | POA: Diagnosis present

## 2016-06-06 DIAGNOSIS — Z79899 Other long term (current) drug therapy: Secondary | ICD-10-CM

## 2016-06-06 DIAGNOSIS — F17210 Nicotine dependence, cigarettes, uncomplicated: Secondary | ICD-10-CM | POA: Diagnosis present

## 2016-06-06 DIAGNOSIS — Z992 Dependence on renal dialysis: Secondary | ICD-10-CM | POA: Diagnosis not present

## 2016-06-06 DIAGNOSIS — Z9889 Other specified postprocedural states: Secondary | ICD-10-CM | POA: Diagnosis not present

## 2016-06-06 DIAGNOSIS — M1990 Unspecified osteoarthritis, unspecified site: Secondary | ICD-10-CM | POA: Diagnosis present

## 2016-06-06 DIAGNOSIS — IMO0001 Reserved for inherently not codable concepts without codable children: Secondary | ICD-10-CM

## 2016-06-06 DIAGNOSIS — Z7951 Long term (current) use of inhaled steroids: Secondary | ICD-10-CM

## 2016-06-06 DIAGNOSIS — I1311 Hypertensive heart and chronic kidney disease without heart failure, with stage 5 chronic kidney disease, or end stage renal disease: Secondary | ICD-10-CM | POA: Diagnosis not present

## 2016-06-06 DIAGNOSIS — E119 Type 2 diabetes mellitus without complications: Secondary | ICD-10-CM

## 2016-06-06 DIAGNOSIS — E1122 Type 2 diabetes mellitus with diabetic chronic kidney disease: Secondary | ICD-10-CM | POA: Diagnosis present

## 2016-06-06 DIAGNOSIS — R339 Retention of urine, unspecified: Secondary | ICD-10-CM | POA: Diagnosis present

## 2016-06-06 DIAGNOSIS — R338 Other retention of urine: Secondary | ICD-10-CM | POA: Diagnosis not present

## 2016-06-06 DIAGNOSIS — S92352A Displaced fracture of fifth metatarsal bone, left foot, initial encounter for closed fracture: Secondary | ICD-10-CM | POA: Diagnosis not present

## 2016-06-06 DIAGNOSIS — I5032 Chronic diastolic (congestive) heart failure: Secondary | ICD-10-CM | POA: Diagnosis present

## 2016-06-06 DIAGNOSIS — Z90721 Acquired absence of ovaries, unilateral: Secondary | ICD-10-CM

## 2016-06-06 DIAGNOSIS — K5900 Constipation, unspecified: Secondary | ICD-10-CM | POA: Diagnosis not present

## 2016-06-06 DIAGNOSIS — L0390 Cellulitis, unspecified: Secondary | ICD-10-CM

## 2016-06-06 DIAGNOSIS — S92353A Displaced fracture of fifth metatarsal bone, unspecified foot, initial encounter for closed fracture: Secondary | ICD-10-CM | POA: Diagnosis present

## 2016-06-06 DIAGNOSIS — L0291 Cutaneous abscess, unspecified: Secondary | ICD-10-CM

## 2016-06-06 LAB — LACTIC ACID, PLASMA
LACTIC ACID, VENOUS: 0.9 mmol/L (ref 0.5–2.0)
Lactic Acid, Venous: 1 mmol/L (ref 0.5–2.0)

## 2016-06-06 LAB — GLUCOSE, CAPILLARY
GLUCOSE-CAPILLARY: 108 mg/dL — AB (ref 65–99)
GLUCOSE-CAPILLARY: 129 mg/dL — AB (ref 65–99)

## 2016-06-06 LAB — COMPREHENSIVE METABOLIC PANEL
ALK PHOS: 58 U/L (ref 38–126)
ALT: 25 U/L (ref 14–54)
AST: 32 U/L (ref 15–41)
Albumin: 3.3 g/dL — ABNORMAL LOW (ref 3.5–5.0)
Anion gap: 11 (ref 5–15)
BILIRUBIN TOTAL: 0.3 mg/dL (ref 0.3–1.2)
BUN: 32 mg/dL — ABNORMAL HIGH (ref 6–20)
CALCIUM: 7.8 mg/dL — AB (ref 8.9–10.3)
CO2: 24 mmol/L (ref 22–32)
CREATININE: 4.34 mg/dL — AB (ref 0.44–1.00)
Chloride: 98 mmol/L — ABNORMAL LOW (ref 101–111)
GFR calc non Af Amer: 11 mL/min — ABNORMAL LOW (ref >60)
GFR, EST AFRICAN AMERICAN: 13 mL/min — AB (ref >60)
Glucose, Bld: 314 mg/dL — ABNORMAL HIGH (ref 65–99)
Potassium: 4.2 mmol/L (ref 3.5–5.1)
SODIUM: 133 mmol/L — AB (ref 135–145)
Total Protein: 8 g/dL (ref 6.5–8.1)

## 2016-06-06 LAB — CBC WITH DIFFERENTIAL/PLATELET
Basophils Absolute: 0.1 10*3/uL (ref 0–0.1)
Basophils Relative: 2 %
EOS ABS: 0.1 10*3/uL (ref 0–0.7)
Eosinophils Relative: 2 %
HEMATOCRIT: 31.2 % — AB (ref 35.0–47.0)
HEMOGLOBIN: 10.4 g/dL — AB (ref 12.0–16.0)
LYMPHS ABS: 1 10*3/uL (ref 1.0–3.6)
LYMPHS PCT: 17 %
MCH: 30.7 pg (ref 26.0–34.0)
MCHC: 33.4 g/dL (ref 32.0–36.0)
MCV: 91.9 fL (ref 80.0–100.0)
Monocytes Absolute: 0.6 10*3/uL (ref 0.2–0.9)
Monocytes Relative: 10 %
NEUTROS ABS: 4.1 10*3/uL (ref 1.4–6.5)
NEUTROS PCT: 69 %
Platelets: 159 10*3/uL (ref 150–440)
RBC: 3.4 MIL/uL — AB (ref 3.80–5.20)
RDW: 14.5 % (ref 11.5–14.5)
WBC: 5.9 10*3/uL (ref 3.6–11.0)

## 2016-06-06 MED ORDER — VENLAFAXINE HCL ER 75 MG PO CP24
150.0000 mg | ORAL_CAPSULE | Freq: Every day | ORAL | Status: DC
  Administered 2016-06-07 – 2016-06-10 (×3): 150 mg via ORAL
  Filled 2016-06-06 (×3): qty 2

## 2016-06-06 MED ORDER — TRAMADOL HCL 50 MG PO TABS
50.0000 mg | ORAL_TABLET | Freq: Two times a day (BID) | ORAL | Status: DC
  Administered 2016-06-06 – 2016-06-10 (×7): 50 mg via ORAL
  Filled 2016-06-06 (×7): qty 1

## 2016-06-06 MED ORDER — PIPERACILLIN-TAZOBACTAM 3.375 G IVPB 30 MIN
3.3750 g | Freq: Once | INTRAVENOUS | Status: AC
  Administered 2016-06-06: 3.375 g via INTRAVENOUS
  Filled 2016-06-06: qty 50

## 2016-06-06 MED ORDER — HEPARIN SODIUM (PORCINE) 5000 UNIT/ML IJ SOLN
5000.0000 [IU] | Freq: Three times a day (TID) | INTRAMUSCULAR | Status: DC
  Administered 2016-06-06 – 2016-06-07 (×2): 5000 [IU] via SUBCUTANEOUS
  Filled 2016-06-06 (×2): qty 1

## 2016-06-06 MED ORDER — SEVELAMER CARBONATE 800 MG PO TABS
1600.0000 mg | ORAL_TABLET | Freq: Three times a day (TID) | ORAL | Status: DC
  Administered 2016-06-07 – 2016-06-10 (×8): 1600 mg via ORAL
  Filled 2016-06-06 (×10): qty 2

## 2016-06-06 MED ORDER — HYDROXYZINE HCL 25 MG PO TABS
50.0000 mg | ORAL_TABLET | Freq: Every day | ORAL | Status: DC
  Administered 2016-06-06 – 2016-06-09 (×4): 50 mg via ORAL
  Filled 2016-06-06 (×4): qty 2

## 2016-06-06 MED ORDER — INSULIN REGULAR HUMAN 100 UNIT/ML IJ SOLN: 5.0000 [IU] | Freq: Three times a day (TID) | INTRAMUSCULAR | Status: DC

## 2016-06-06 MED ORDER — MORPHINE SULFATE (PF) 2 MG/ML IV SOLN
1.0000 mg | INTRAVENOUS | Status: DC | PRN
  Administered 2016-06-07 – 2016-06-08 (×2): 1 mg via INTRAVENOUS
  Filled 2016-06-06 (×2): qty 1

## 2016-06-06 MED ORDER — OXYCODONE HCL 5 MG PO TABS
10.0000 mg | ORAL_TABLET | Freq: Four times a day (QID) | ORAL | Status: DC | PRN
Start: 2016-06-06 — End: 2016-06-11
  Administered 2016-06-09 – 2016-06-10 (×2): 10 mg via ORAL
  Filled 2016-06-06 (×2): qty 2

## 2016-06-06 MED ORDER — AMITRIPTYLINE HCL 10 MG PO TABS
5.0000 mg | ORAL_TABLET | Freq: Every day | ORAL | Status: DC
  Administered 2016-06-06 – 2016-06-09 (×4): 5 mg via ORAL
  Filled 2016-06-06: qty 0.5
  Filled 2016-06-06 (×3): qty 1

## 2016-06-06 MED ORDER — MORPHINE SULFATE (PF) 4 MG/ML IV SOLN
4.0000 mg | Freq: Once | INTRAVENOUS | Status: AC
  Administered 2016-06-06: 4 mg via INTRAVENOUS
  Filled 2016-06-06: qty 1

## 2016-06-06 MED ORDER — DIPHENHYDRAMINE HCL 25 MG PO CAPS
25.0000 mg | ORAL_CAPSULE | Freq: Once | ORAL | Status: AC
  Administered 2016-06-06: 25 mg via ORAL

## 2016-06-06 MED ORDER — ONDANSETRON HCL 4 MG PO TABS: 4.0000 mg | ORAL_TABLET | Freq: Four times a day (QID) | ORAL | Status: DC | PRN

## 2016-06-06 MED ORDER — VANCOMYCIN HCL IN DEXTROSE 750-5 MG/150ML-% IV SOLN
750.0000 mg | INTRAVENOUS | Status: DC
  Filled 2016-06-06: qty 150

## 2016-06-06 MED ORDER — INSULIN GLARGINE 100 UNIT/ML ~~LOC~~ SOLN
15.0000 [IU] | Freq: Every day | SUBCUTANEOUS | Status: DC
  Administered 2016-06-06 – 2016-06-09 (×4): 15 [IU] via SUBCUTANEOUS
  Filled 2016-06-06 (×5): qty 0.15

## 2016-06-06 MED ORDER — HYDRALAZINE HCL 50 MG PO TABS
100.0000 mg | ORAL_TABLET | Freq: Two times a day (BID) | ORAL | Status: DC
  Administered 2016-06-06 – 2016-06-09 (×6): 100 mg via ORAL
  Filled 2016-06-06 (×6): qty 2

## 2016-06-06 MED ORDER — MOMETASONE FURO-FORMOTEROL FUM 200-5 MCG/ACT IN AERO
2.0000 | INHALATION_SPRAY | Freq: Two times a day (BID) | RESPIRATORY_TRACT | Status: DC
  Administered 2016-06-07 – 2016-06-10 (×8): 2 via RESPIRATORY_TRACT
  Filled 2016-06-06: qty 8.8

## 2016-06-06 MED ORDER — RENA-VITE PO TABS
1.0000 | ORAL_TABLET | Freq: Every day | ORAL | Status: DC
  Administered 2016-06-06 – 2016-06-09 (×4): 1 via ORAL
  Filled 2016-06-06 (×4): qty 1

## 2016-06-06 MED ORDER — PIPERACILLIN-TAZOBACTAM 3.375 G IVPB
3.3750 g | Freq: Two times a day (BID) | INTRAVENOUS | Status: DC
  Administered 2016-06-07 – 2016-06-09 (×5): 3.375 g via INTRAVENOUS
  Filled 2016-06-06 (×8): qty 50

## 2016-06-06 MED ORDER — DILTIAZEM HCL ER 60 MG PO CP12
120.0000 mg | ORAL_CAPSULE | Freq: Two times a day (BID) | ORAL | Status: DC
  Administered 2016-06-06 – 2016-06-09 (×6): 120 mg via ORAL
  Filled 2016-06-06 (×10): qty 2

## 2016-06-06 MED ORDER — INSULIN ASPART 100 UNIT/ML ~~LOC~~ SOLN
0.0000 [IU] | Freq: Three times a day (TID) | SUBCUTANEOUS | Status: DC
  Administered 2016-06-07 – 2016-06-09 (×3): 2 [IU] via SUBCUTANEOUS
  Administered 2016-06-09: 3 [IU] via SUBCUTANEOUS
  Administered 2016-06-10: 1 [IU] via SUBCUTANEOUS
  Filled 2016-06-06: qty 1
  Filled 2016-06-06: qty 2
  Filled 2016-06-06: qty 3
  Filled 2016-06-06 (×2): qty 2

## 2016-06-06 MED ORDER — DIPHENHYDRAMINE HCL 25 MG PO CAPS
ORAL_CAPSULE | ORAL | Status: AC
  Filled 2016-06-06: qty 1

## 2016-06-06 MED ORDER — OXYCODONE HCL 5 MG PO TABS
5.0000 mg | ORAL_TABLET | ORAL | Status: DC | PRN
  Administered 2016-06-07 – 2016-06-10 (×3): 5 mg via ORAL
  Filled 2016-06-06 (×3): qty 1

## 2016-06-06 MED ORDER — VANCOMYCIN HCL IN DEXTROSE 1-5 GM/200ML-% IV SOLN
1000.0000 mg | Freq: Once | INTRAVENOUS | Status: AC
  Administered 2016-06-06: 1000 mg via INTRAVENOUS
  Filled 2016-06-06: qty 200

## 2016-06-06 MED ORDER — VANCOMYCIN HCL 500 MG IV SOLR
500.0000 mg | Freq: Once | INTRAVENOUS | Status: AC
  Administered 2016-06-06: 500 mg via INTRAVENOUS
  Filled 2016-06-06: qty 500

## 2016-06-06 MED ORDER — PANTOPRAZOLE SODIUM 40 MG PO TBEC
40.0000 mg | DELAYED_RELEASE_TABLET | Freq: Every day | ORAL | Status: DC
  Administered 2016-06-07 – 2016-06-10 (×3): 40 mg via ORAL
  Filled 2016-06-06 (×3): qty 1

## 2016-06-06 MED ORDER — OXYCODONE HCL 10 MG PO TABS: 10.0000 mg | ORAL_TABLET | Freq: Four times a day (QID) | ORAL | Status: DC | PRN

## 2016-06-06 MED ORDER — ONDANSETRON HCL 4 MG/2ML IJ SOLN: 4.0000 mg | Freq: Four times a day (QID) | INTRAMUSCULAR | Status: DC | PRN

## 2016-06-06 MED ORDER — ONDANSETRON HCL 4 MG/2ML IJ SOLN
4.0000 mg | INTRAMUSCULAR | Status: AC
  Administered 2016-06-06: 4 mg via INTRAVENOUS
  Filled 2016-06-06: qty 2

## 2016-06-06 NOTE — ED Notes (Signed)
Report from [place], rn.

## 2016-06-06 NOTE — ED Provider Notes (Signed)
[facility] Emergency Department Provider Note  ____________________________________________  Time seen: Approximately 7:31 PM  I have reviewed the triage vital signs and the nursing notes.   HISTORY  Chief Complaint Foot Burn    HPI [name] is a 50 y.o. female with a history of end-stage renal disease on dialysis Monday, Wednesday, Friday, peripheral neuropathy, andinsulin-dependent diabetes who presents with worsening pain and rash on her left foot and left leg.  She was seen approximately one week ago after sustaining a burn on the bottom of her left foot.  She was walking barefoot on hot pavement and did not notice that it was causing an open wound until the degree of the injury of her came her neuropathy and she felt the pain.  She was seen in flex and given Silvadene ointment and was given conservative management recommendations.  However in the week since her visit the wound has gotten steadily worse and now her entire foot is red, edematous, and there is redness spreading up throughout her lower extremity.  There is no purulent discharge.  The pain is severe and constant and aching/burning.  Additionally she describes the development over the last several days of fever and chills with a fever at home up to 100.9 as well as several episodes of nausea and vomiting today.   Past Medical History  Diagnosis Date  . Hypertension   . CHF (congestive heart failure) ([place])   . COPD (chronic obstructive pulmonary disease) ([place])   . Diabetes mellitus without complication ([place])   . Depression   . Anxiety   . Chronic kidney disease   . Arthritis   . Anemia   . Hepatitis   . Tobacco dependence   . Emphysema of lung ([place])   . Hypercholesterolemia   . End stage renal disease [facility])     Patient Active Problem List   Diagnosis Date Noted  . Adjustment disorder with mixed disturbance of emotions and conduct 05/11/2016  . Dysthymia 05/11/2016    Past  Surgical History  Procedure Laterality Date  . Cyst removed  from rt hand    . Tonsillectomy    . Rt. tubal and ovary removed    . Cholecystectomy    . Peripheral vascular catheterization N/A 07/25/2015    Procedure: A/V Shuntogram/Fistulagram;  Surgeon: Algernon Huxley, MD;  Location: [place] [facility];  Service: Cardiovascular;  Laterality: N/A;  . Peripheral vascular catheterization Left 07/25/2015    Procedure: A/V Shunt Intervention;  Surgeon: Algernon Huxley, MD;  Location: [place] [facility];  Service: Cardiovascular;  Laterality: Left;  . Peripheral vascular catheterization Left 10/07/2015    Procedure: A/V Shuntogram/Fistulagram;  Surgeon: Algernon Huxley, MD;  Location: [place] [facility];  Service: Cardiovascular;  Laterality: Left;  . Peripheral vascular catheterization N/A 10/07/2015    Procedure: A/V Shunt Intervention;  Surgeon: Algernon Huxley, MD;  Location: [place] [facility];  Service: Cardiovascular;  Laterality: N/A;  . Peripheral vascular catheterization  10/07/2015    Procedure: Dialysis/Perma Catheter Insertion;  Surgeon: Algernon Huxley, MD;  Location: [place] [facility];  Service: Cardiovascular;;  . Peripheral vascular catheterization N/A 12/17/2015    Procedure: Dialysis/Perma Catheter Removal;  Surgeon: Katha Cabal, MD;  Location: [place] [facility];  Service: Cardiovascular;  Laterality: N/A;    Current Outpatient Rx  Name  Route  Sig  Dispense  Refill  . amitriptyline (ELAVIL) 10 MG tablet   Oral   Take  5 mg by mouth at bedtime. Reported on 12/17/2015         . b complex-vitamin c-folic acid (NEPHRO-VITE) 0.8 MG TABS tablet   Oral   Take 1 tablet by mouth daily.         Marland Kitchen diltiazem (CARDIZEM SR) 120 MG 12 hr capsule   Oral   Take 120 mg by mouth 2 (two) times daily.         Marland Kitchen docusate sodium (COLACE) 100 MG capsule   Oral   Take 100 mg by mouth as needed for mild constipation.         . DULoxetine (CYMBALTA) 60 MG capsule   Oral   Take 1  capsule (60 mg total) by mouth daily. Patient not taking: Reported on 12/17/2015   30 capsule   0   . Fluticasone-Salmeterol (ADVAIR) 250-50 MCG/DOSE AEPB   Inhalation   Inhale 1 puff into the lungs 2 (two) times daily.         . hydrALAZINE (APRESOLINE) 100 MG tablet   Oral   Take 100 mg by mouth 2 (two) times daily.         . hydrOXYzine (ATARAX/VISTARIL) 25 MG tablet   Oral   Take 50 mg by mouth at bedtime.         . insulin glargine (LANTUS) 100 UNIT/ML injection   Subcutaneous   Inject 15 Units into the skin daily.         . insulin regular (NOVOLIN R,HUMULIN R) 100 units/mL injection   Subcutaneous   Inject 5-10 Units into the skin 3 (three) times daily before meals. Pt uses per sliding scale.         . metoprolol tartrate (LOPRESSOR) 25 MG tablet   Oral   Take 25 mg by mouth 2 (two) times daily.         Marland Kitchen omeprazole (PRILOSEC) 20 MG capsule   Oral   Take 20 mg by mouth daily.         . Oxycodone HCl 10 MG TABS   Oral   Take 10 mg by mouth 4 (four) times daily as needed (for pain).         Marland Kitchen sevelamer carbonate (RENVELA) 800 MG tablet   Oral   Take 1,600 mg by mouth 3 (three) times daily with meals.         . silver sulfADIAZINE (SILVADENE) 1 % cream      Apply to affected area daily   50 g   0   . traMADol (ULTRAM) 50 MG tablet   Oral   Take 1 tablet (50 mg total) by mouth 2 (two) times daily.   10 tablet   0   . triamcinolone cream (KENALOG) 0.1 %   Topical   Apply 1 application topically as needed (for psoriasis).         . venlafaxine XR (EFFEXOR-XR) 75 MG 24 hr capsule   Oral   Take 150 mg by mouth daily.           Allergies Cinnamon; Cinoxate; Tylenol; Garlic; and Onion  No family history on file.  Social History Social History  Substance Use Topics  . Smoking status: Current Every Day Smoker -- 0.50 packs/day for 17 years    Types: Cigarettes  . Smokeless tobacco: None  . Alcohol Use: No    Review of  Systems Constitutional: Subjective fever/chills Eyes: No visual changes. ENT: No sore throat. Cardiovascular: Denies chest pain. Respiratory: Denies shortness  of breath. Gastrointestinal: No abdominal pain.  +N/V.  No diarrhea.  No constipation. Genitourinary: Negative for dysuria. Musculoskeletal: Negative for back pain. Skin: Severe blistering at the bottom of the left foot with a red edematous rash radiating proximally to the left lower extremity Neurological: Negative for headaches, focal weakness or numbness.  10-point ROS otherwise negative.  ____________________________________________   PHYSICAL EXAM:  VITAL SIGNS: ED Triage Vitals  Enc Vitals Group     BP 06/06/16 1634 175/91 mmHg     Pulse Rate 06/06/16 1634 106     Resp 06/06/16 1634 20     Temp 06/06/16 1634 98.3 F (36.8 C)     Temp Source 06/06/16 1634 Oral     SpO2 06/06/16 1634 100 %     Weight 06/06/16 1634 191 lb 12.8 oz (87 kg)     Height 06/06/16 1634 5\' 5"  (1.651 m)     Head Cir --      Peak Flow --      Pain Score 06/06/16 1634 8     Pain Loc --      Pain Edu? --      Excl. in [place]? --     Constitutional: Alert and oriented. Well appearing and in no acute distress. Eyes: Conjunctivae are normal. PERRL. EOMI. Head: Atraumatic. Nose: No congestion/rhinnorhea. Mouth/Throat: Mucous membranes are moist.  Oropharynx non-erythematous. Neck: No stridor.  No meningeal signs.   Cardiovascular: Normal rate, regular rhythm. Good peripheral circulation. Grossly normal heart sounds.   Respiratory: Normal respiratory effort.  No retractions. Lungs CTAB. Gastrointestinal: Soft and nontender. No distention.  Musculoskeletal:  Neurologic:  Normal speech and language. No gross focal neurologic deficits are appreciated.  Skin:  Left Foot has some edema and redness consistent with cellulitis and desquamation of the bottom of the foot due to her prior burn wound.  The redness is spreading proximally and then ends at  the anterior ankle but then begins again with a large area of erythema and warmth on the anterior left lower extremity that is consistent either with cellulitis or a vasculitis. Psychiatric: Mood and affect are normal. Speech and behavior are normal.  ____________________________________________   LABS (all labs ordered are listed, but only abnormal results are displayed)  Labs Reviewed  CBC WITH DIFFERENTIAL/PLATELET - Abnormal; Notable for the following:    RBC 3.40 (*)    Hemoglobin 10.4 (*)    HCT 31.2 (*)    All other components within normal limits  COMPREHENSIVE METABOLIC PANEL - Abnormal; Notable for the following:    Sodium 133 (*)    Chloride 98 (*)    Glucose, Bld 314 (*)    BUN 32 (*)    Creatinine, Ser 4.34 (*)    Calcium 7.8 (*)    Albumin 3.3 (*)    GFR calc non Af Amer 11 (*)    GFR calc Af Amer 13 (*)    All other components within normal limits  LACTIC ACID, PLASMA  LACTIC ACID, PLASMA   ____________________________________________  EKG  None ____________________________________________  RADIOLOGY   Dg Foot Complete Left  06/06/2016  CLINICAL DATA:  Patient was walking in the parking lot from the pool to the hotel room. She noticed that she had blisters on both feet and the right foot was worse than the left and the blister had already broke open. There is redness and swelling around the open blister, along the medial and anterior foot. Hx diabetes, neuropathy. EXAM: LEFT FOOT - COMPLETE 3+  VIEW COMPARISON:  None. FINDINGS: There is a fracture of the base of the fifth metatarsal, which may be chronic given this patient's history. No other fractures.  Joints are normally spaced and aligned. There are no areas of bone resorption to suggest osteomyelitis. There are small dorsal plantar calcaneal spurs. IMPRESSION: 1. Fracture, nondisplaced, at the base of the fifth metatarsal of the chronicity. 2. No other fractures.  No dislocation. 3. No evidence of  osteomyelitis. Electronically Signed   By: Lajean Manes M.D.   On: 06/06/2016 20:40    ____________________________________________   PROCEDURES  Procedure(s) performed:   Procedures   ____________________________________________   INITIAL IMPRESSION / ASSESSMENT AND PLAN / ED COURSE  Pertinent labs & imaging results that were available during my care of the patient were reviewed by me and considered in my medical decision making (see chart for details).  The patient has a rapidly worsening infection of the left foot that is spreading up the leg.  Her rash on the left lower anterior extremity appears to be more of a vasculitis than a cellulitis but the foot definitely appears to have cellulitis.  Even though she has no leukocytosis I am very worried about all her comorbidities and the possibility of rapidly worsening infection.  I consider transfer to the burn center, but her main issue now is wound infection.  The burn is subacute and should be treatable as an acute on chronic wound at this point.  Discussed this with the patient and she understands and agrees with the plan to be admitted here.  I took photos of the foot and leg with the [employer] app - they can be found in the Media tab under Chart Review. ____________________________________________  FINAL CLINICAL IMPRESSION(S) / ED DIAGNOSES  Final diagnoses:  Cellulitis of left foot  Left foot burn, second degree, subsequent encounter  Cellulitis of left anterior lower leg  ESRD (end stage renal disease) on dialysis ([employer])  Insulin dependent diabetes mellitus ([place])     MEDICATIONS GIVEN DURING THIS VISIT:  Medications  vancomycin (VANCOCIN) IVPB 1000 mg/200 mL premix (1,000 mg Intravenous New Bag/Given 06/06/16 2010)  piperacillin-tazobactam (ZOSYN) IVPB 3.375 g (0 g Intravenous Stopped 06/06/16 2006)  morphine 4 MG/ML injection 4 mg (4 mg Intravenous Given 06/06/16 1943)  ondansetron (ZOFRAN) injection 4 mg (4 mg  Intravenous Given 06/06/16 1944)     NEW OUTPATIENT MEDICATIONS STARTED DURING THIS VISIT:  New Prescriptions   No medications on file      Note:  This document was prepared using Dragon voice recognition software and may include unintentional dictation errors.   Hinda Kehr, MD 06/06/16 2055

## 2016-06-06 NOTE — ED Notes (Signed)
Admission plan and process explanation provided to pt and pt's daughter at bedside. Pt and daughter verbalize understanding. Pt with improved blood pressure post morphine administration.

## 2016-06-06 NOTE — ED Notes (Signed)
States burned bottom of R foot walking on hot pavement one week ago, increased redness and red up leg. Hx dialysis and diabetes.

## 2016-06-06 NOTE — ED Notes (Signed)
Pt complains of "itching" to right arm. No hives or redness noted. No s/s of infiltration noted.

## 2016-06-06 NOTE — Consult Note (Signed)
Pharmacy Antibiotic Note  [name] is a 50 y.o. female admitted on 06/06/2016 with cellulitis.  Pharmacy has been consulted for vancomycin and zosyn dosing. Pt is a ESRD pt on HD WMF Plan: Pt received 1g on vancomycin in ED. Will give an additional 500mg  for a total load dose of 1500mg . Will then give vancomycin 750mg  after HD q MWF. Goal pre HD of 15-25 mcg/ml Will draw level prior to the 3rd HD session 6/23 Zosyn 3.375g q 12 hours  Height: 5\' 5"  (165.1 cm) Weight: 191 lb 12.8 oz (87 kg) IBW/kg (Calculated) : 57  Temp (24hrs), Avg:98.3 F (36.8 C), Min:98.3 F (36.8 C), Max:98.3 F (36.8 C)   Recent Labs Lab 06/06/16 1645 06/06/16 1949  WBC 5.9  --   CREATININE 4.34*  --   LATICACIDVEN  --  1.0    Estimated Creatinine Clearance: 16.7 mL/min (by C-G formula based on Cr of 4.34).    Allergies  Allergen Reactions  . Cinnamon Anaphylaxis  . Cinoxate Anaphylaxis  . Tylenol [Acetaminophen] Anaphylaxis  . Garlic Hives  . Onion Hives and Swelling    Antimicrobials this admission: vancomycin 6/17 >>  zosyn 6/17 >>   Dose adjustments this admission:   Microbiology results:   Thank you for allowing pharmacy to be a part of this patient's care.  Ramond Dial, Pharm.D Clinical Pharmacist   06/06/2016 9:43 PM

## 2016-06-06 NOTE — H&P (Signed)
[facility] [place] NAME: Mitsuyo Schilder    MR#:  [id number]  DATE OF BIRTH:  01/23/1966  DATE OF ADMISSION:  06/06/2016  PRIMARY CARE PHYSICIAN: Tula Nakayama, MD   REQUESTING/REFERRING PHYSICIAN: Dr. Hinda Kehr  CHIEF COMPLAINT:   Chief Complaint  Patient presents with  . Foot Burn  Left lower extremity cellulitis.  HISTORY OF PRESENT ILLNESS:  [name]  is a 50 y.o. female with a known history of End-stage renal disease on hemodialysis, hypertension, COPD, history of CHF, depression/anxiety, osteoarthritis who presented to the hospital due to left lower extremity pain and redness and swelling. Patient apparently was walking barefoot and had a superficial burn to her plantar surface of her left foot and was seen in the ER and placed on Silvadene with a dressing. She apparently continued to walk on it barefoot and then for the past year so she is no significant redness to her foot which is tracking up her left leg associated with significant pain and swelling. She came to the ER for further evaluation and was noted to have a left lower extremity cellulitis. And hospitalist services were contacted further treatment and evaluation. Patient does admit to low-grade fever 100.9 also associated nausea and vomiting but no abdominal pain. She admits to some diarrhea which is chronic for the patient.  PAST MEDICAL HISTORY:   Past Medical History  Diagnosis Date  . Hypertension   . CHF (congestive heart failure) ([place])   . COPD (chronic obstructive pulmonary disease) ([place])   . Diabetes mellitus without complication ([place])   . Depression   . Anxiety   . Chronic kidney disease   . Arthritis   . Anemia   . Hepatitis   . Tobacco dependence   . Emphysema of lung ([place])   . Hypercholesterolemia   . End stage renal disease ([place])     PAST SURGICAL HISTORY:   Past Surgical History  Procedure Laterality Date  . Cyst removed  from rt hand    .  Tonsillectomy    . Rt. tubal and ovary removed    . Cholecystectomy    . Peripheral vascular catheterization N/A 07/25/2015    Procedure: A/V Shuntogram/Fistulagram;  Surgeon: Algernon Huxley, MD;  Location: [facility];  Service: Cardiovascular;  Laterality: N/A;  . Peripheral vascular catheterization Left 07/25/2015    Procedure: A/V Shunt Intervention;  Surgeon: Algernon Huxley, MD;  Location: [facility];  Service: Cardiovascular;  Laterality: Left;  . Peripheral vascular catheterization Left 10/07/2015    Procedure: A/V Shuntogram/Fistulagram;  Surgeon: Algernon Huxley, MD;  Location: [facility];  Service: Cardiovascular;  Laterality: Left;  . Peripheral vascular catheterization N/A 10/07/2015    Procedure: A/V Shunt Intervention;  Surgeon: Algernon Huxley, MD;  Location: [facility];  Service: Cardiovascular;  Laterality: N/A;  . Peripheral vascular catheterization  10/07/2015    Procedure: Dialysis/Perma Catheter Insertion;  Surgeon: Algernon Huxley, MD;  Location: [facility];  Service: Cardiovascular;;  . Peripheral vascular catheterization N/A 12/17/2015    Procedure: Dialysis/Perma Catheter Removal;  Surgeon: Katha Cabal, MD;  Location: [facility];  Service: Cardiovascular;  Laterality: N/A;    SOCIAL HISTORY:   Social History  Substance Use Topics  . Smoking status: Current Every Day Smoker -- 0.50 packs/day for 17 years    Types: Cigarettes  . Smokeless tobacco: Not on file  . Alcohol Use:  No    FAMILY HISTORY:   Family History  Problem Relation Age of Onset  . Hypertension Father   . Hypertension Mother   . Heart disease Mother   . Heart disease Mother     DRUG ALLERGIES:   Allergies  Allergen Reactions  . Cinnamon Anaphylaxis  . Cinoxate Anaphylaxis  . Tylenol [Acetaminophen] Anaphylaxis  . Garlic Hives  . Onion Hives and Swelling    REVIEW OF SYSTEMS:   Review of Systems  Constitutional: Positive for fever.  Negative for weight loss.  HENT: Negative for congestion, nosebleeds and tinnitus.   Eyes: Negative for blurred vision, double vision and redness.  Respiratory: Negative for cough, hemoptysis and shortness of breath.   Cardiovascular: Negative for chest pain, orthopnea, leg swelling and PND.  Gastrointestinal: Positive for nausea and vomiting. Negative for abdominal pain, diarrhea and melena.  Genitourinary: Negative for dysuria, urgency and hematuria.  Musculoskeletal: Negative for joint pain and falls.  Neurological: Negative for dizziness, tingling, sensory change, focal weakness, seizures, weakness and headaches.  Endo/Heme/Allergies: Negative for polydipsia. Does not bruise/bleed easily.  Psychiatric/Behavioral: Negative for depression and memory loss. The patient is not nervous/anxious.     MEDICATIONS AT HOME:   Prior to Admission medications   Medication Sig Start Date End Date Taking? Authorizing Provider  amitriptyline (ELAVIL) 10 MG tablet Take 5 mg by mouth at bedtime. Reported on 12/17/2015   Yes Historical Provider, MD  b complex-vitamin c-folic acid (NEPHRO-VITE) 0.8 MG TABS tablet Take 1 tablet by mouth daily.   Yes Historical Provider, MD  diltiazem (CARDIZEM SR) 120 MG 12 hr capsule Take 120 mg by mouth 2 (two) times daily.   Yes Historical Provider, MD  Fluticasone-Salmeterol (ADVAIR) 250-50 MCG/DOSE AEPB Inhale 1 puff into the lungs 2 (two) times daily.   Yes Historical Provider, MD  hydrALAZINE (APRESOLINE) 100 MG tablet Take 100 mg by mouth 2 (two) times daily.   Yes Historical Provider, MD  hydrOXYzine (ATARAX/VISTARIL) 25 MG tablet Take 50 mg by mouth at bedtime.   Yes Historical Provider, MD  insulin glargine (LANTUS) 100 UNIT/ML injection Inject 15 Units into the skin at bedtime.    Yes Historical Provider, MD  insulin regular (NOVOLIN R,HUMULIN R) 100 units/mL injection Inject 5-10 Units into the skin 3 (three) times daily before meals. Pt uses per sliding scale.    Yes Historical Provider, MD  omeprazole (PRILOSEC) 40 MG capsule Take 40 mg by mouth daily.   Yes Historical Provider, MD  Oxycodone HCl 10 MG TABS Take 10 mg by mouth 4 (four) times daily as needed (for pain).   Yes Historical Provider, MD  sevelamer carbonate (RENVELA) 800 MG tablet Take 1,600 mg by mouth 3 (three) times daily with meals.   Yes Historical Provider, MD  silver sulfADIAZINE (SILVADENE) 1 % cream Apply to affected area daily 05/31/16 06/30/16 Yes Jenise V Bacon Menshew, PA-C  sulfamethoxazole-trimethoprim (BACTRIM DS,SEPTRA DS) 800-160 MG tablet Take 1 tablet by mouth 2 (two) times daily.   Yes Historical Provider, MD  traMADol (ULTRAM) 50 MG tablet Take 1 tablet (50 mg total) by mouth 2 (two) times daily. 05/31/16  Yes Jenise V Bacon Menshew, PA-C  venlafaxine XR (EFFEXOR-XR) 75 MG 24 hr capsule Take 150 mg by mouth daily.   Yes Historical Provider, MD  DULoxetine (CYMBALTA) 60 MG capsule Take 1 capsule (60 mg total) by mouth daily. Patient not taking: Reported on 12/17/2015 10/12/15   Carrie Mew, MD  metoprolol tartrate (LOPRESSOR) 25 MG tablet  Take 25 mg by mouth 2 (two) times daily. Reported on 06/06/2016    Historical Provider, MD  triamcinolone cream (KENALOG) 0.1 % Apply 1 application topically as needed (for psoriasis).    Historical Provider, MD      VITAL SIGNS:  Blood pressure 155/76, pulse 84, temperature 98.3 F (36.8 C), temperature source Oral, resp. rate 16, height 5\' 5"  (1.651 m), weight 87 kg (191 lb 12.8 oz), SpO2 100 %.  PHYSICAL EXAMINATION:  Physical Exam  GENERAL:  50 y.o.-year-old patient lying in the bed in no acute distress.  EYES: Pupils equal, round, reactive to light and accommodation. No scleral icterus. Extraocular muscles intact.  HEENT: Head atraumatic, normocephalic. Oropharynx and nasopharynx clear. No oropharyngeal erythema, moist oral mucosa  NECK:  Supple, no jugular venous distention. No thyroid enlargement, no tenderness.  LUNGS:  Normal breath sounds bilaterally, no wheezing, rales, rhonchi. No use of accessory muscles of respiration.  CARDIOVASCULAR: S1, S2 RRR. No murmurs, rubs, gallops, clicks.  ABDOMEN: Soft, nontender, nondistended. Bowel sounds present. No organomegaly or mass.  EXTREMITIES: Left lower extremity redness and swelling consistent with cellulitis. The open wound well demarcated on the left plantar foot area without acute drainage. Looks like a first to second degree burn. +2 pulses bilaterally no cyanosis bilaterally.  NEUROLOGIC: Cranial nerves II through XII are intact. No focal Motor or sensory deficits appreciated b/l PSYCHIATRIC: The patient is alert and oriented x 3. Good affect.  SKIN: No obvious rash, lesion, or ulcer.   Left upper extremity AV fistula with good bruit and thrill.  LABORATORY PANEL:   CBC  Recent Labs Lab 06/06/16 1645  WBC 5.9  HGB 10.4*  HCT 31.2*  PLT 159   ------------------------------------------------------------------------------------------------------------------  Chemistries   Recent Labs Lab 06/06/16 1645  NA 133*  K 4.2  CL 98*  CO2 24  GLUCOSE 314*  BUN 32*  CREATININE 4.34*  CALCIUM 7.8*  AST 32  ALT 25  ALKPHOS 58  BILITOT 0.3   ------------------------------------------------------------------------------------------------------------------  Cardiac Enzymes No results for input(s): TROPONINI in the last 168 hours. ------------------------------------------------------------------------------------------------------------------  RADIOLOGY:  Dg Foot Complete Left  06/06/2016  CLINICAL DATA:  Patient was walking in the parking lot from the pool to the hotel room. She noticed that she had blisters on both feet and the right foot was worse than the left and the blister had already broke open. There is redness and swelling around the open blister, along the medial and anterior foot. Hx diabetes, neuropathy. EXAM: LEFT FOOT - COMPLETE 3+  VIEW COMPARISON:  None. FINDINGS: There is a fracture of the base of the fifth metatarsal, which may be chronic given this patient's history. No other fractures.  Joints are normally spaced and aligned. There are no areas of bone resorption to suggest osteomyelitis. There are small dorsal plantar calcaneal spurs. IMPRESSION: 1. Fracture, nondisplaced, at the base of the fifth metatarsal of the chronicity. 2. No other fractures.  No dislocation. 3. No evidence of osteomyelitis. Electronically Signed   By: Lajean Manes M.D.   On: 06/06/2016 20:40     IMPRESSION AND PLAN:   49 year old female with past medical history of end-stage renal disease on hemodialysis, diabetic neuropathy, diabetes, anxiety/depression, osteoarthritis, COPD, history of CHF who presents to the hospital due to left lower extremity redness swelling and pain.  1. Left lower extremity cellulitis-still cause of patient's redness swelling and pain. -This is probably secondary to the left plantar wound/ first to second-degree burn. -I will start the patient  on IV vancomycin, Zosyn. Follow cultures. -I will get a wound team consult. Follow clinically. -Patient does have a nondisplaced fracture of the base of the fifth metatarsal but seems chronic. If needed consider podiatry consult.  2. COPD-no acute exacerbation. Continue Dulera.  3. Essential hypertension-continue Cardizem and hydralazine.  4. Diabetes type 2 with renal complications-continue Lantus, NovoLog with meals.  5. Secondary hyperparathyroidism-continue Renvela.  6. End-stage renal disease on hemodialysis-patient gets dialysis on Monday Wednesday Friday. -I will consult nephrology.  7. Depression-continue Effexor, Elavil.   All the records are reviewed and case discussed with ED provider. Management plans discussed with the patient, family and they are in agreement.  CODE STATUS: Full Code  TOTAL TIME TAKING CARE OF THIS PATIENT: 45 minutes.     Henreitta Leber M.D on 06/06/2016 at 9:31 PM  Between 7am to 6pm - Pager - [phone]  After 6pm go to [email or web address] - password [email or web address] [facility]  Office  [phone]  CC: Primary care physician; Tula Nakayama, MD

## 2016-06-07 LAB — BASIC METABOLIC PANEL
ANION GAP: 9 (ref 5–15)
BUN: 35 mg/dL — ABNORMAL HIGH (ref 6–20)
CO2: 24 mmol/L (ref 22–32)
Calcium: 7.5 mg/dL — ABNORMAL LOW (ref 8.9–10.3)
Chloride: 103 mmol/L (ref 101–111)
Creatinine, Ser: 4.81 mg/dL — ABNORMAL HIGH (ref 0.44–1.00)
GFR calc Af Amer: 11 mL/min — ABNORMAL LOW (ref >60)
GFR, EST NON AFRICAN AMERICAN: 10 mL/min — AB (ref >60)
Glucose, Bld: 113 mg/dL — ABNORMAL HIGH (ref 65–99)
POTASSIUM: 4.4 mmol/L (ref 3.5–5.1)
SODIUM: 136 mmol/L (ref 135–145)

## 2016-06-07 LAB — GLUCOSE, CAPILLARY
GLUCOSE-CAPILLARY: 83 mg/dL (ref 65–99)
GLUCOSE-CAPILLARY: 249 mg/dL — AB (ref 65–99)
Glucose-Capillary: 162 mg/dL — ABNORMAL HIGH (ref 65–99)
Glucose-Capillary: 155 mg/dL — ABNORMAL HIGH (ref 65–99)

## 2016-06-07 LAB — TROPONIN I
TROPONIN I: 0.03 ng/mL (ref <0.031)
Troponin I: 0.04 ng/mL — ABNORMAL HIGH (ref <0.031)

## 2016-06-07 LAB — CBC
HEMATOCRIT: 27.7 % — AB (ref 35.0–47.0)
HEMOGLOBIN: 9.3 g/dL — AB (ref 12.0–16.0)
MCH: 30.9 pg (ref 26.0–34.0)
MCHC: 33.5 g/dL (ref 32.0–36.0)
MCV: 92.2 fL (ref 80.0–100.0)
Platelets: 148 10*3/uL — ABNORMAL LOW (ref 150–440)
RBC: 3.01 MIL/uL — ABNORMAL LOW (ref 3.80–5.20)
RDW: 14.8 % — ABNORMAL HIGH (ref 11.5–14.5)
WBC: 4.8 10*3/uL (ref 3.6–11.0)

## 2016-06-07 MED ORDER — NITROGLYCERIN 0.4 MG SL SUBL
0.4000 mg | SUBLINGUAL_TABLET | SUBLINGUAL | Status: DC | PRN
  Administered 2016-06-07 – 2016-06-10 (×4): 0.4 mg via SUBLINGUAL
  Filled 2016-06-07: qty 1
  Filled 2016-06-07: qty 3
  Filled 2016-06-07 (×3): qty 1

## 2016-06-07 MED ORDER — NITROGLYCERIN 0.4 MG SL SUBL: 0.4000 mg | SUBLINGUAL_TABLET | SUBLINGUAL | Status: DC | PRN

## 2016-06-07 MED ORDER — SILVER SULFADIAZINE 1 % EX CREA
TOPICAL_CREAM | Freq: Two times a day (BID) | CUTANEOUS | Status: DC
  Administered 2016-06-07 (×2): via TOPICAL
  Filled 2016-06-07: qty 85

## 2016-06-07 NOTE — Progress Notes (Signed)
Pt complaining of left sided chest pain 7/10 discomforting pressure. MD to place orders.

## 2016-06-07 NOTE — Progress Notes (Signed)
Dr. Marcille Blanco notified of troponin of 0.04. No new orders at this time.

## 2016-06-07 NOTE — Progress Notes (Signed)
DR. Marcille Blanco to order EKG and NITRO

## 2016-06-07 NOTE — Progress Notes (Signed)
[facility] [place] NAME: [name]    MR#:  [id number]  DATE OF BIRTH:  [birth year]  SUBJECTIVE:  CHIEF COMPLAINT:  Patient is reporting some chest discomfort but denies any shortness of breath Gets dialysis on Monday, Wednesday and Friday  REVIEW OF SYSTEMS:  CONSTITUTIONAL: No fever, fatigue or weakness.  EYES: No blurred or double vision.  EARS, NOSE, AND THROAT: No tinnitus or ear pain.  RESPIRATORY: No cough, shortness of breath, wheezing or hemoptysis.  CARDIOVASCULAR:Reporting chest discomfort. No orthopnea, edema.  GASTROINTESTINAL: No nausea, vomiting, diarrhea or abdominal pain.  GENITOURINARY: No dysuria, hematuria.  ENDOCRINE: No polyuria, nocturia,  HEMATOLOGY: No anemia, easy bruising or bleeding SKIN: No rash or lesion. MUSCULOSKELETAL: No joint pain or arthritis.   NEUROLOGIC: No tingling, numbness, weakness.  PSYCHIATRY: No anxiety or depression.   DRUG ALLERGIES:   Allergies  Allergen Reactions  . Cinnamon Anaphylaxis  . Cinoxate Anaphylaxis  . Tylenol [Acetaminophen] Anaphylaxis  . Garlic Hives  . Onion Hives and Swelling    VITALS:  Blood pressure 123/48, pulse 83, temperature 98.8 F (37.1 C), temperature source Oral, resp. rate 18, height 5\' 5"  (1.651 m), weight 87 kg (191 lb 12.8 oz), SpO2 100 %.  PHYSICAL EXAMINATION:  GENERAL:  50 y.o.-year-old patient lying in the bed with no acute distress.  EYES: Pupils equal, round, reactive to light and accommodation. No scleral icterus. Extraocular muscles intact.  HEENT: Head atraumatic, normocephalic. Oropharynx and nasopharynx clear.  NECK:  Supple, no jugular venous distention. No thyroid enlargement, no tenderness.  LUNGS: Normal breath sounds bilaterally, no wheezing, rales,rhonchi or crepitation. No use of accessory muscles of respiration.  CARDIOVASCULAR: S1, S2 normal. No murmurs, rubs, or gallops.  ABDOMEN: Soft, nontender, nondistended. Bowel  sounds present. No organomegaly or mass.  EXTREMITIES: Left lower extremity redness and swelling consistent with cellulitis. The open wound well demarcated on the left plantar foot area without acute drainage. Looks like a first to second degree burn. +2 pulses bilaterally no cyanosis bilaterally.  No pedal edema, cyanosis, or clubbing.  NEUROLOGIC: Cranial nerves II through XII are intact. Muscle strength 5/5 in all extremities. Sensation intact. Gait not checked.  PSYCHIATRIC: The patient is alert and oriented x 3.  SKIN: No obvious rash, lesion, or ulcer.    LABORATORY PANEL:   CBC  Recent Labs Lab 06/07/16 0355  WBC 4.8  HGB 9.3*  HCT 27.7*  PLT 148*   ------------------------------------------------------------------------------------------------------------------  Chemistries   Recent Labs Lab 06/06/16 1645 06/07/16 0355  NA 133* 136  K 4.2 4.4  CL 98* 103  CO2 24 24  GLUCOSE 314* 113*  BUN 32* 35*  CREATININE 4.34* 4.81*  CALCIUM 7.8* 7.5*  AST 32  --   ALT 25  --   ALKPHOS 58  --   BILITOT 0.3  --    ------------------------------------------------------------------------------------------------------------------  Cardiac Enzymes  Recent Labs Lab 06/07/16 0355  TROPONINI 0.04*   ------------------------------------------------------------------------------------------------------------------  RADIOLOGY:  Dg Foot Complete Left  06/06/2016  CLINICAL DATA:  Patient was walking in the parking lot from the pool to the hotel room. She noticed that she had blisters on both feet and the right foot was worse than the left and the blister had already broke open. There is redness and swelling around the open blister, along the medial and anterior foot. Hx diabetes, neuropathy. EXAM: LEFT FOOT - COMPLETE 3+ VIEW COMPARISON:  None. FINDINGS: There is a fracture of the base of  the fifth metatarsal, which may be chronic given this patient's history. No other fractures.   Joints are normally spaced and aligned. There are no areas of bone resorption to suggest osteomyelitis. There are small dorsal plantar calcaneal spurs. IMPRESSION: 1. Fracture, nondisplaced, at the base of the fifth metatarsal of the chronicity. 2. No other fractures.  No dislocation. 3. No evidence of osteomyelitis. Electronically Signed   By: Lajean Manes M.D.   On: 06/06/2016 20:40    EKG:   Orders placed or performed during the hospital encounter of 06/06/16  . EKG 12-Lead  . EKG 12-Lead  . EKG 12-Lead  . EKG 12-Lead    ASSESSMENT AND PLAN:   50 year old female with past medical history of end-stage renal disease on hemodialysis, diabetic neuropathy, diabetes, anxiety/depression, osteoarthritis, COPD, history of CHF who presents to the hospital due to left lower extremity redness swelling and pain.  #. Left lower extremity cellulitis-still cause of patient's redness swelling and pain. -This is probably secondary to the left plantar blister/ first to second-degree burn. -Continue IV vancomycin, Zosyn. Follow cultures. -Pending wound team consult. Follow clinically. -Silvadene cream -Patient does have a nondisplaced fracture of the base of the fifth metatarsal but seems chronic. If needed consider podiatry consult.  #Chest discomfort could be from anxiety Initial troponin is negative. Will check 1 more troponin to rule out acute MI  #. COPD-no acute exacerbation. Continue Dulera.  # Essential hypertension-continue Cardizem and hydralazine.  #. Diabetes type 2 with renal complications-continue Lantus, NovoLog with meals.  #. Secondary hyperparathyroidism-continue Renvela.  #. End-stage renal disease on hemodialysis-patient gets dialysis on Monday Wednesday Friday. Follow-up with nephrology.  #. Depression-continue Effexor, Elavil.  No suicidal or homicidal ideations at this time     All the records are reviewed and case discussed with Care Management/Social  Workerr. Management plans discussed with the patient, family and they are in agreement.  CODE STATUS: fc   TOTAL TIME TAKING CARE OF THIS PATIENT: 36 minutes.   POSSIBLE D/C IN 2-3 DAYS, DEPENDING ON CLINICAL CONDITION.  Note: This dictation was prepared with Dragon dictation along with smaller phrase technology. Any transcriptional errors that result from this process are unintentional.   Nicholes Mango M.D on 06/07/2016 at 3:21 PM  Between 7am to 6pm - Pager - [phone] After 6pm go to [email or web address] - password [email or web address] [facility]  Office  [phone]  CC: Primary care physician; Tula Nakayama, MD

## 2016-06-07 NOTE — Progress Notes (Addendum)
Met with patient and completed assessment. Patient has no further needs at this time and will not go to ALF,SNF or [facility]. She has 2 community LCSW one from [facility] and SW from [facility]) that are assisting her with community linkages and housing applications. Currently she and her son and sons girlfriend live [place] [facility] and split the rent.  Sons girlfriend  transports her to [facility] 3x week for Hemo-dialysis and other appointments.   No further needs    LCSW [phone] 

## 2016-06-07 NOTE — Progress Notes (Signed)
Prime doc paged. 

## 2016-06-07 NOTE — Progress Notes (Signed)
Nitro relieved chest pain.

## 2016-06-07 NOTE — Progress Notes (Signed)
Prim doc paged about troponin 0.04
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
## 2016-06-08 ENCOUNTER — Inpatient Hospital Stay: Payer: Medicare Other

## 2016-06-08 LAB — CBC
HCT: 26.5 % — ABNORMAL LOW (ref 35.0–47.0)
Hemoglobin: 8.7 g/dL — ABNORMAL LOW (ref 12.0–16.0)
MCH: 30.2 pg (ref 26.0–34.0)
MCHC: 32.7 g/dL (ref 32.0–36.0)
MCV: 92.4 fL (ref 80.0–100.0)
PLATELETS: 148 10*3/uL — AB (ref 150–440)
RBC: 2.87 MIL/uL — AB (ref 3.80–5.20)
RDW: 14.9 % — AB (ref 11.5–14.5)
WBC: 5.5 10*3/uL (ref 3.6–11.0)

## 2016-06-08 LAB — BASIC METABOLIC PANEL
ANION GAP: 11 (ref 5–15)
BUN: 47 mg/dL — ABNORMAL HIGH (ref 6–20)
CALCIUM: 7.4 mg/dL — AB (ref 8.9–10.3)
CO2: 22 mmol/L (ref 22–32)
Chloride: 99 mmol/L — ABNORMAL LOW (ref 101–111)
Creatinine, Ser: 5.97 mg/dL — ABNORMAL HIGH (ref 0.44–1.00)
GFR, EST AFRICAN AMERICAN: 9 mL/min — AB (ref >60)
GFR, EST NON AFRICAN AMERICAN: 7 mL/min — AB (ref >60)
GLUCOSE: 208 mg/dL — AB (ref 65–99)
Potassium: 4.8 mmol/L (ref 3.5–5.1)
SODIUM: 132 mmol/L — AB (ref 135–145)

## 2016-06-08 LAB — PHOSPHORUS: Phosphorus: 4.8 mg/dL — ABNORMAL HIGH (ref 2.5–4.6)

## 2016-06-08 LAB — GLUCOSE, CAPILLARY
GLUCOSE-CAPILLARY: 162 mg/dL — AB (ref 65–99)
GLUCOSE-CAPILLARY: 275 mg/dL — AB (ref 65–99)
Glucose-Capillary: 111 mg/dL — ABNORMAL HIGH (ref 65–99)
Glucose-Capillary: 266 mg/dL — ABNORMAL HIGH (ref 65–99)

## 2016-06-08 MED ORDER — ENOXAPARIN SODIUM 30 MG/0.3ML ~~LOC~~ SOLN
30.0000 mg | SUBCUTANEOUS | Status: DC
  Filled 2016-06-08 (×2): qty 0.3

## 2016-06-08 MED ORDER — ALTEPLASE 2 MG IJ SOLR
2.0000 mg | Freq: Once | INTRAMUSCULAR | Status: DC | PRN
  Filled 2016-06-08: qty 2

## 2016-06-08 MED ORDER — LIDOCAINE HCL (PF) 1 % IJ SOLN
5.0000 mL | INTRAMUSCULAR | Status: DC | PRN
  Filled 2016-06-08: qty 5

## 2016-06-08 MED ORDER — SODIUM CHLORIDE 0.9 % IV SOLN: 100.0000 mL | INTRAVENOUS | Status: DC | PRN

## 2016-06-08 MED ORDER — PENTAFLUOROPROP-TETRAFLUOROETH EX AERO
1.0000 "application " | INHALATION_SPRAY | CUTANEOUS | Status: DC | PRN
  Filled 2016-06-08: qty 30

## 2016-06-08 MED ORDER — LIDOCAINE-PRILOCAINE 2.5-2.5 % EX CREA
1.0000 "application " | TOPICAL_CREAM | CUTANEOUS | Status: DC | PRN
  Filled 2016-06-08: qty 5

## 2016-06-08 MED ORDER — SALINE SPRAY 0.65 % NA SOLN
1.0000 | NASAL | Status: DC | PRN
  Administered 2016-06-08: 1 via NASAL
  Filled 2016-06-08: qty 44

## 2016-06-08 MED ORDER — HEPARIN SODIUM (PORCINE) 1000 UNIT/ML DIALYSIS
1000.0000 [IU] | INTRAMUSCULAR | Status: DC | PRN
  Filled 2016-06-08: qty 1

## 2016-06-08 MED ORDER — ALPRAZOLAM 0.5 MG PO TABS
0.5000 mg | ORAL_TABLET | Freq: Three times a day (TID) | ORAL | Status: DC | PRN
  Administered 2016-06-08 – 2016-06-10 (×5): 0.5 mg via ORAL
  Filled 2016-06-08 (×5): qty 1

## 2016-06-08 NOTE — Care Management Note (Signed)
Patient is active at [facility] MWF 2nd shift.  Admission records have been sent and I will update with additional medical records at discharge.  Iran Sizer  Dialysis Coordinator  [phone]

## 2016-06-08 NOTE — Progress Notes (Signed)
Hemodialysis completed. 
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
## 2016-06-08 NOTE — Progress Notes (Signed)
PRE HD   

## 2016-06-08 NOTE — Progress Notes (Signed)
HD TX started  

## 2016-06-08 NOTE — Progress Notes (Signed)
Post hd tx 

## 2016-06-08 NOTE — Progress Notes (Signed)
MD Gouru returned page, MD coming to bedside to assess pt.

## 2016-06-08 NOTE — Progress Notes (Signed)
[place] [facility] [place] NAME: [name]    MR#:  [id number]  DATE OF BIRTH:  03/24/1966  SUBJECTIVE:  CHIEF COMPLAINT:  Patient is reporting some chest discomfort but Anxious, acute MI ruled out with negative troponins Gets dialysis on Monday, Wednesday and Friday  REVIEW OF SYSTEMS:  CONSTITUTIONAL: No fever, fatigue or weakness.  EYES: No blurred or double vision.  EARS, NOSE, AND THROAT: No tinnitus or ear pain.  RESPIRATORY: No cough, shortness of breath, wheezing or hemoptysis.  CARDIOVASCULAR:Reporting chest discomfort. No orthopnea, edema.  GASTROINTESTINAL: No nausea, vomiting, diarrhea or abdominal pain.  GENITOURINARY: No dysuria, hematuria.  ENDOCRINE: No polyuria, nocturia,  HEMATOLOGY: No anemia, easy bruising or bleeding SKIN: No rash or lesion. MUSCULOSKELETAL: No joint pain or arthritis.   NEUROLOGIC: No tingling, numbness, weakness.  PSYCHIATRY: No anxiety or depression.   DRUG ALLERGIES:   Allergies  Allergen Reactions  . Cinnamon Anaphylaxis  . Cinoxate Anaphylaxis  . Tylenol [Acetaminophen] Anaphylaxis  . Garlic Hives  . Onion Hives and Swelling    VITALS:  Blood pressure 165/71, pulse 85, temperature 98.5 F (36.9 C), temperature source Oral, resp. rate 13, height 5\' 5"  (1.651 m), weight 87 kg (191 lb 12.8 oz), SpO2 96 %.  PHYSICAL EXAMINATION:  GENERAL:  50 y.o.-year-old patient lying in the bed with no acute distress.  EYES: Pupils equal, round, reactive to light and accommodation. No scleral icterus. Extraocular muscles intact.  HEENT: Head atraumatic, normocephalic. Oropharynx and nasopharynx clear.  NECK:  Supple, no jugular venous distention. No thyroid enlargement, no tenderness.  LUNGS: Normal breath sounds bilaterally, no wheezing, rales,rhonchi or crepitation. No use of accessory muscles of respiration.  CARDIOVASCULAR: S1, S2 normal. No murmurs, rubs, or gallops.  ABDOMEN: Soft, nontender,  nondistended. Bowel sounds present. No organomegaly or mass.  EXTREMITIES: Left lower extremity redness and swelling consistent with cellulitis. The open wound well demarcated on the left plantar foot area without acute drainage. Looks like a first to second degree burn. +2 pulses bilaterally no cyanosis bilaterally.  No pedal edema, cyanosis, or clubbing.  NEUROLOGIC: Cranial nerves II through XII are intact. Muscle strength 5/5 in all extremities. Sensation intact. Gait not checked.  PSYCHIATRIC: The patient is alert and oriented x 3.  SKIN: No obvious rash, lesion, or ulcer.    LABORATORY PANEL:   CBC  Recent Labs Lab 06/08/16 0355  WBC 5.5  HGB 8.7*  HCT 26.5*  PLT 148*   ------------------------------------------------------------------------------------------------------------------  Chemistries   Recent Labs Lab 06/06/16 1645  06/08/16 0355  NA 133*  < > 132*  K 4.2  < > 4.8  CL 98*  < > 99*  CO2 24  < > 22  GLUCOSE 314*  < > 208*  BUN 32*  < > 47*  CREATININE 4.34*  < > 5.97*  CALCIUM 7.8*  < > 7.4*  AST 32  --   --   ALT 25  --   --   ALKPHOS 58  --   --   BILITOT 0.3  --   --   < > = values in this interval not displayed. ------------------------------------------------------------------------------------------------------------------  Cardiac Enzymes  Recent Labs Lab 06/07/16 1823  TROPONINI 0.03   ------------------------------------------------------------------------------------------------------------------  RADIOLOGY:  Mr Foot Left Wo Contrast  06/08/2016  CLINICAL DATA:  50 y.o. female with a known history of End-stage renal disease on hemodialysis, hypertension, COPD, history of CHF, depression/anxiety, osteoarthritis who presented to the hospital due to  left lower extremity pain and redness. EXAM: MRI OF THE LEFT FOREFOOT WITHOUT CONTRAST TECHNIQUE: Multiplanar, multisequence MR imaging was performed. No intravenous contrast was administered.  COMPARISON:  None. FINDINGS: Patient motion degrades image quality limiting evaluation. Bones/Joint/Cartilage Mild osteoarthritis of the first MTP joint. No marrow signal abnormality. Ununited fracture without similar marrow edema involving the base of the fifth metatarsal. Normal alignment. No joint effusion. Collaterals Collateral ligaments are intact. Tendons Flexor and extensor compartment tendons are intact. Muscles No focal muscle atrophy. Edema and mild expansion of the proximal abductor hallucis muscle (image 14/series 8) which may be neurogenic edema versus mild myositis. Otherwise mild increased T2 signal within the plantar musculature likely neurogenic given the patient's history of diabetes. Soft tissue No fluid collection or hematoma. Mild soft tissue edema along the dorsal lateral aspect of the left foot which may be reactive versus secondary to mild cellulitis. IMPRESSION: 1. Edema and mild expansion of the proximal abductor hallucis muscle (image 14/series 8) which may be neurogenic edema versus mild myositis. 2. Ununited fracture without similar marrow edema involving the base of the fifth metatarsal. 3. Mild osteoarthritis of the first MTP joint. Electronically Signed   By: Kathreen Devoid   On: 06/08/2016 10:24   Dg Foot Complete Left  06/06/2016  CLINICAL DATA:  Patient was walking in the parking lot from the pool to the hotel room. She noticed that she had blisters on both feet and the right foot was worse than the left and the blister had already broke open. There is redness and swelling around the open blister, along the medial and anterior foot. Hx diabetes, neuropathy. EXAM: LEFT FOOT - COMPLETE 3+ VIEW COMPARISON:  None. FINDINGS: There is a fracture of the base of the fifth metatarsal, which may be chronic given this patient's history. No other fractures.  Joints are normally spaced and aligned. There are no areas of bone resorption to suggest osteomyelitis. There are small dorsal plantar  calcaneal spurs. IMPRESSION: 1. Fracture, nondisplaced, at the base of the fifth metatarsal of the chronicity. 2. No other fractures.  No dislocation. 3. No evidence of osteomyelitis. Electronically Signed   By: Lajean Manes M.D.   On: 06/06/2016 20:40    EKG:   Orders placed or performed during the hospital encounter of 06/06/16  . EKG 12-Lead  . EKG 12-Lead  . EKG 12-Lead  . EKG 12-Lead    ASSESSMENT AND PLAN:   50 year old female with past medical history of end-stage renal disease on hemodialysis, diabetic neuropathy, diabetes, anxiety/depression, osteoarthritis, COPD, history of CHF who presents to the hospital due to left lower extremity redness swelling and pain.  #. Left lower extremity cellulitis-still cause of patient's redness swelling and pain. -This is probably secondary to the left plantar blister/ first to second-degree burn. -Continue IV  Zosyn. DC vancomycin  -Cultures never done as documented in the history and physical -Follow-up with wound team . Follow clinically. -Silvadene cream -Patient does have a nondisplaced fracture of the base of the fifth metatarsal but seems chronic. If needed consider podiatry consult.  #Chest discomfort could be from anxiety 2 troponins are negative and ruled out acute MI   #. COPD-no acute exacerbation. Continue Dulera.  # Essential hypertension-continue Cardizem and hydralazine.  #. Diabetes type 2 with renal complications-continue Lantus, NovoLog with meals.  #. Secondary hyperparathyroidism-continue Renvela.  #. End-stage renal disease on hemodialysis-patient gets dialysis on Monday Wednesday Friday. Follow-up with nephrology.  #. Depression-continue Effexor, Elavil.  No suicidal or homicidal  ideations at this time     All the records are reviewed and case discussed with Care Management/Social Workerr. Management plans discussed with the patient, family and they are in agreement.  CODE STATUS: fc   TOTAL TIME  TAKING CARE OF THIS PATIENT: 36 minutes.   POSSIBLE D/C IN 2-3 DAYS, DEPENDING ON CLINICAL CONDITION.  Note: This dictation was prepared with Dragon dictation along with smaller phrase technology. Any transcriptional errors that result from this process are unintentional.   Nicholes Mango M.D on 06/08/2016 at 4:30 PM  Between 7am to 6pm - Pager - [phone] After 6pm go to [email or web address] - password [email or web address] [place]  Office  [phone]  CC: Primary care physician; Tula Nakayama, MD
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
## 2016-06-08 NOTE — Progress Notes (Signed)
Pre-hd tx 

## 2016-06-08 NOTE — Progress Notes (Signed)
Notified Dr Conley Canal of bladder scan results 960. MD will place order to straight cath pt

## 2016-06-08 NOTE — Progress Notes (Signed)
Pt complaining of chest pain in the left breast area up to the shoulder. Pt states she is feeling anxious. (See flowsheet for vitals.) MD Gouru paged, waiting on call back.

## 2016-06-08 NOTE — Progress Notes (Signed)
[facility]  ROUNDING NOTE   Subjective:   Seen and examined on hemodialysis. Tolerating treatment well. UF goal of 1.5 litres.   MRI with no osteomyelitis  Objective:  Vital signs in last 24 hours:  Temp:  [98.3 F (36.8 C)-98.9 F (37.2 C)] 98.7 F (37.1 C) (06/19 0730) Pulse Rate:  [83-91] 90 (06/19 1152) Resp:  [18] 18 (06/19 1152) BP: (123-146)/(48-71) 146/61 mmHg (06/19 1152) SpO2:  [97 %-100 %] 98 % (06/19 1152)  Weight change:  Filed Weights   06/06/16 1634  Weight: 87 kg (191 lb 12.8 oz)    Intake/Output: I/O last 3 completed shifts: In: 480 [P.O.:480] Out: 300 [Urine:300]   Intake/Output this shift:  Total I/O In: 240 [P.O.:240] Out: -   Physical Exam: General: NAD, laying in bed  Head: Normocephalic, atraumatic. Moist oral mucosal membranes  Eyes: Anicteric  Neck: Supple, trachea midline  Lungs:  Clear to auscultation normal effort  Heart: S1S2 no rubs  Abdomen:  Soft, nontender, BS present   Extremities: trace peripheral edema, area of ulceration on left sole, surrounding cellulitis noted, cellulitis also noted in LLE  Neurologic: Nonfocal, moving all four extremities  Skin: Left foot sole as above  Access: LUE AVF    Basic Metabolic Panel:  Recent Labs Lab 06/06/16 1645 06/07/16 0355 06/08/16 0355  NA 133* 136 132*  K 4.2 4.4 4.8  CL 98* 103 99*  CO2 24 24 22   GLUCOSE 314* 113* 208*  BUN 32* 35* 47*  CREATININE 4.34* 4.81* 5.97*  CALCIUM 7.8* 7.5* 7.4*    Liver Function Tests:  Recent Labs Lab 06/06/16 1645  AST 32  ALT 25  ALKPHOS 58  BILITOT 0.3  PROT 8.0  ALBUMIN 3.3*   No results for input(s): LIPASE, AMYLASE in the last 168 hours. No results for input(s): AMMONIA in the last 168 hours.  CBC:  Recent Labs Lab 06/06/16 1645 06/07/16 0355 06/08/16 0355  WBC 5.9 4.8 5.5  NEUTROABS 4.1  --   --   HGB 10.4* 9.3* 8.7*  HCT 31.2* 27.7* 26.5*  MCV 91.9 92.2 92.4  PLT 159 148* 148*    Cardiac  Enzymes:  Recent Labs Lab 06/07/16 0355 06/07/16 1532 06/07/16 1823  TROPONINI 0.04* <0.03 0.03    BNP: Invalid input(s): POCBNP  CBG:  Recent Labs Lab 06/07/16 1114 06/07/16 1621 06/07/16 2039 06/08/16 0720 06/08/16 1135  GLUCAP 162* 155* 249* 162* 275*    Microbiology: Results for orders placed or performed during the hospital encounter of 10/11/15  Urine culture     Status: None   Collection Time: 10/11/15  8:46 PM  Result Value Ref Range Status   Specimen Description URINE, CLEAN CATCH  Final   Special Requests NONE  Final   Culture MULTIPLE SPECIES PRESENT, SUGGEST RECOLLECTION  Final   Report Status 10/13/2015 FINAL  Final    Coagulation Studies: No results for input(s): LABPROT, INR in the last 72 hours.  Urinalysis: No results for input(s): COLORURINE, LABSPEC, PHURINE, GLUCOSEU, HGBUR, BILIRUBINUR, KETONESUR, PROTEINUR, UROBILINOGEN, NITRITE, LEUKOCYTESUR in the last 72 hours.  Invalid input(s): APPERANCEUR    Imaging: Mr Foot Left Wo Contrast  06/08/2016  CLINICAL DATA:  50 y.o. female with a known history of End-stage renal disease on hemodialysis, hypertension, COPD, history of CHF, depression/anxiety, osteoarthritis who presented to the hospital due to left lower extremity pain and redness. EXAM: MRI OF THE LEFT FOREFOOT WITHOUT CONTRAST TECHNIQUE: Multiplanar, multisequence MR imaging was performed. No intravenous contrast was  administered. COMPARISON:  None. FINDINGS: Patient motion degrades image quality limiting evaluation. Bones/Joint/Cartilage Mild osteoarthritis of the first MTP joint. No marrow signal abnormality. Ununited fracture without similar marrow edema involving the base of the fifth metatarsal. Normal alignment. No joint effusion. Collaterals Collateral ligaments are intact. Tendons Flexor and extensor compartment tendons are intact. Muscles No focal muscle atrophy. Edema and mild expansion of the proximal abductor hallucis muscle (image  14/series 8) which may be neurogenic edema versus mild myositis. Otherwise mild increased T2 signal within the plantar musculature likely neurogenic given the patient's history of diabetes. Soft tissue No fluid collection or hematoma. Mild soft tissue edema along the dorsal lateral aspect of the left foot which may be reactive versus secondary to mild cellulitis. IMPRESSION: 1. Edema and mild expansion of the proximal abductor hallucis muscle (image 14/series 8) which may be neurogenic edema versus mild myositis. 2. Ununited fracture without similar marrow edema involving the base of the fifth metatarsal. 3. Mild osteoarthritis of the first MTP joint. Electronically Signed   By: Kathreen Devoid   On: 06/08/2016 10:24   Dg Foot Complete Left  06/06/2016  CLINICAL DATA:  Patient was walking in the parking lot from the pool to the hotel room. She noticed that she had blisters on both feet and the right foot was worse than the left and the blister had already broke open. There is redness and swelling around the open blister, along the medial and anterior foot. Hx diabetes, neuropathy. EXAM: LEFT FOOT - COMPLETE 3+ VIEW COMPARISON:  None. FINDINGS: There is a fracture of the base of the fifth metatarsal, which may be chronic given this patient's history. No other fractures.  Joints are normally spaced and aligned. There are no areas of bone resorption to suggest osteomyelitis. There are small dorsal plantar calcaneal spurs. IMPRESSION: 1. Fracture, nondisplaced, at the base of the fifth metatarsal of the chronicity. 2. No other fractures.  No dislocation. 3. No evidence of osteomyelitis. Electronically Signed   By: Lajean Manes M.D.   On: 06/06/2016 20:40     Medications:     . amitriptyline  5 mg Oral QHS  . diltiazem  120 mg Oral BID  . hydrALAZINE  100 mg Oral BID  . hydrOXYzine  50 mg Oral QHS  . insulin aspart  0-9 Units Subcutaneous TID WC  . insulin glargine  15 Units Subcutaneous QHS  .  mometasone-formoterol  2 puff Inhalation BID  . multivitamin  1 tablet Oral QHS  . pantoprazole  40 mg Oral Daily  . piperacillin-tazobactam (ZOSYN)  IV  3.375 g Intravenous Q12H  . sevelamer carbonate  1,600 mg Oral TID WC  . silver sulfADIAZINE   Topical BID  . traMADol  50 mg Oral BID  . vancomycin  750 mg Intravenous Q M,W,F-HD  . venlafaxine XR  150 mg Oral Daily   sodium chloride, sodium chloride, ALPRAZolam, alteplase, heparin, lidocaine (PF), lidocaine-prilocaine, morphine injection, nitroGLYCERIN, ondansetron **OR** ondansetron (ZOFRAN) IV, oxyCODONE, oxyCODONE, pentafluoroprop-tetrafluoroeth, sodium chloride  Assessment/ Plan:  50 y.o.white female with medical history of hypertension, diastolic heart failure, diabetes mellitus type 2, and anemia of chronic kidney disease, hepatitis C, depression, psoriasis, peripheral neuropathy, ESRD, admission for left foot blister/LLE cellulitis   [facility] MWF  1.  End-stage renal disease on dialysis MWF. Seen and examined on hemodialysis. Tolerating treatment well.   2. Anemia chronic and disease. Hemoglobin 8.7.  - epo with dialysis treatment  3. Secondary hyperparathyroidism. -  Continue  Renvela 1600 mg by mouth 3 times a day with meals   4. Hypertension. Blood pressure at goal - Continue diltiazem and hydralazine.  5. Left foot blister with surrounding cellulitis/left lower extremity cellulitis.  - empiric zosyn and vancomycin.    LOS: [place], Nadean Montanaro 6/19/20172:14 PM

## 2016-06-08 NOTE — Progress Notes (Signed)
Pt complaining of a dry and bloody nose. Spoke with Dr. Manuella Ghazi. Discontinue heparin and may order saline nose spray.

## 2016-06-09 LAB — GLUCOSE, CAPILLARY
GLUCOSE-CAPILLARY: 124 mg/dL — AB (ref 65–99)
GLUCOSE-CAPILLARY: 203 mg/dL — AB (ref 65–99)
Glucose-Capillary: 194 mg/dL — ABNORMAL HIGH (ref 65–99)
Glucose-Capillary: 206 mg/dL — ABNORMAL HIGH (ref 65–99)

## 2016-06-09 LAB — PARATHYROID HORMONE, INTACT (NO CA): PTH: 152 pg/mL — ABNORMAL HIGH (ref 15–65)

## 2016-06-09 LAB — HEPATITIS B SURFACE ANTIGEN: Hepatitis B Surface Ag: NEGATIVE

## 2016-06-09 MED ORDER — BISACODYL 5 MG PO TBEC
10.0000 mg | DELAYED_RELEASE_TABLET | Freq: Every day | ORAL | Status: DC
  Administered 2016-06-09 – 2016-06-10 (×2): 10 mg via ORAL
  Filled 2016-06-09 (×2): qty 2

## 2016-06-09 MED ORDER — HEPARIN SODIUM (PORCINE) 5000 UNIT/ML IJ SOLN
5000.0000 [IU] | Freq: Two times a day (BID) | INTRAMUSCULAR | Status: DC
  Filled 2016-06-09 (×2): qty 1

## 2016-06-09 MED ORDER — SENNOSIDES-DOCUSATE SODIUM 8.6-50 MG PO TABS
1.0000 | ORAL_TABLET | Freq: Two times a day (BID) | ORAL | Status: DC
  Administered 2016-06-09 – 2016-06-10 (×3): 1 via ORAL
  Filled 2016-06-09 (×3): qty 1

## 2016-06-09 MED ORDER — AMOXICILLIN-POT CLAVULANATE 875-125 MG PO TABS
1.0000 | ORAL_TABLET | Freq: Two times a day (BID) | ORAL | Status: DC
  Administered 2016-06-09 – 2016-06-10 (×2): 1 via ORAL
  Filled 2016-06-09 (×2): qty 1

## 2016-06-09 MED ORDER — BISACODYL 10 MG RE SUPP
10.0000 mg | Freq: Once | RECTAL | Status: AC
  Administered 2016-06-09: 10 mg via RECTAL
  Filled 2016-06-09: qty 1

## 2016-06-09 NOTE — Progress Notes (Signed)
Pt unable to void. Bladder scan completed for >400. Will notify md.

## 2016-06-09 NOTE — Progress Notes (Signed)
Spoke with Dr. Margaretmary Eddy. Proceed with i/o cath. Also informed md that pts iv is out and pt does not wish to have another inserted. MD approved. Will change abx to po.

## 2016-06-09 NOTE — Care Management Important Message (Signed)
Important Message  Patient Details  Name: [name] MRN: [id number] Date of Birth: [birth year]   Medicare Important Message Given:  Yes    Juliann Pulse A Thorin Starner 06/09/2016, 2:05 PM

## 2016-06-09 NOTE — Care Management (Signed)
Spoke with patient regarding home health for wound care and she would like to use [facility]. I have notified Corene Cornea with [facility] of this need and patient's living arrangements. RNCM will continue to follow.
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
## 2016-06-09 NOTE — Progress Notes (Signed)
Pt refused heparin because she has a history of hemorrhaging from eyes. Also she would like to have suppository before having SSE administered. Dr Conley Canal notified. Will place order for suppository and SCD's
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
## 2016-06-09 NOTE — Progress Notes (Signed)
[facility]  ROUNDING NOTE   Subjective:   Hemodialysis treatment yesterday. Tolerated treatment well.   Objective:  Vital signs in last 24 hours:  Temp:  [98.4 F (36.9 C)-99.7 F (37.6 C)] 99.7 F (37.6 C) (06/20 0718) Pulse Rate:  [81-96] 87 (06/20 0718) Resp:  [11-19] 18 (06/20 0718) BP: (112-177)/(59-76) 137/62 mmHg (06/20 0718) SpO2:  [95 %-99 %] 97 % (06/20 0718) Weight:  [89.6 kg (197 lb 8.5 oz)-90.039 kg (198 lb 8 oz)] 90.039 kg (198 lb 8 oz) (06/20 0440)  Weight change:  Filed Weights   06/06/16 1634 06/08/16 1756 06/09/16 0440  Weight: 87 kg (191 lb 12.8 oz) 89.6 kg (197 lb 8.5 oz) 90.039 kg (198 lb 8 oz)    Intake/Output: I/O last 3 completed shifts: In: 240 [P.O.:240] Out: 3700 [Urine:2200; Other:1500]   Intake/Output this shift:  Total I/O In: 240 [P.O.:240] Out: -   Physical Exam: General: NAD, laying in bed  Head: Normocephalic, atraumatic. Moist oral mucosal membranes  Eyes: Anicteric  Neck: Supple, trachea midline  Lungs:  Clear to auscultation normal effort  Heart: S1S2 no rubs  Abdomen:  Soft, nontender, BS present   Extremities: trace peripheral edema, area of ulceration on left sole, surrounding cellulitis noted, cellulitis also noted in LLE  Neurologic: Nonfocal, moving all four extremities  Skin: Left foot sole as above  Access: LUE AVF    Basic Metabolic Panel:  Recent Labs Lab 06/06/16 1645 06/07/16 0355 06/08/16 0355 06/08/16 1536  NA 133* 136 132*  --   K 4.2 4.4 4.8  --   CL 98* 103 99*  --   CO2 24 24 22   --   GLUCOSE 314* 113* 208*  --   BUN 32* 35* 47*  --   CREATININE 4.34* 4.81* 5.97*  --   CALCIUM 7.8* 7.5* 7.4*  --   PHOS  --   --   --  4.8*    Liver Function Tests:  Recent Labs Lab 06/06/16 1645  AST 32  ALT 25  ALKPHOS 58  BILITOT 0.3  PROT 8.0  ALBUMIN 3.3*   No results for input(s): LIPASE, AMYLASE in the last 168 hours. No results for input(s): AMMONIA in the last 168  hours.  CBC:  Recent Labs Lab 06/06/16 1645 06/07/16 0355 06/08/16 0355  WBC 5.9 4.8 5.5  NEUTROABS 4.1  --   --   HGB 10.4* 9.3* 8.7*  HCT 31.2* 27.7* 26.5*  MCV 91.9 92.2 92.4  PLT 159 148* 148*    Cardiac Enzymes:  Recent Labs Lab 06/07/16 0355 06/07/16 1532 06/07/16 1823  TROPONINI 0.04* <0.03 0.03    BNP: Invalid input(s): POCBNP  CBG:  Recent Labs Lab 06/08/16 0720 06/08/16 1135 06/08/16 1830 06/08/16 2052 06/09/16 0720  GLUCAP 162* 275* 111* 266* 124*    Microbiology: Results for orders placed or performed during the hospital encounter of 10/11/15  Urine culture     Status: None   Collection Time: 10/11/15  8:46 PM  Result Value Ref Range Status   Specimen Description URINE, CLEAN CATCH  Final   Special Requests NONE  Final   Culture MULTIPLE SPECIES PRESENT, SUGGEST RECOLLECTION  Final   Report Status 10/13/2015 FINAL  Final    Coagulation Studies: No results for input(s): LABPROT, INR in the last 72 hours.  Urinalysis: No results for input(s): COLORURINE, LABSPEC, PHURINE, GLUCOSEU, HGBUR, BILIRUBINUR, KETONESUR, PROTEINUR, UROBILINOGEN, NITRITE, LEUKOCYTESUR in the last 72 hours.  Invalid input(s): APPERANCEUR  Imaging: Mr Foot Left Wo Contrast  06/08/2016  CLINICAL DATA:  50 y.o. female with a known history of End-stage renal disease on hemodialysis, hypertension, COPD, history of CHF, depression/anxiety, osteoarthritis who presented to the hospital due to left lower extremity pain and redness. EXAM: MRI OF THE LEFT FOREFOOT WITHOUT CONTRAST TECHNIQUE: Multiplanar, multisequence MR imaging was performed. No intravenous contrast was administered. COMPARISON:  None. FINDINGS: Patient motion degrades image quality limiting evaluation. Bones/Joint/Cartilage Mild osteoarthritis of the first MTP joint. No marrow signal abnormality. Ununited fracture without similar marrow edema involving the base of the fifth metatarsal. Normal alignment. No  joint effusion. Collaterals Collateral ligaments are intact. Tendons Flexor and extensor compartment tendons are intact. Muscles No focal muscle atrophy. Edema and mild expansion of the proximal abductor hallucis muscle (image 14/series 8) which may be neurogenic edema versus mild myositis. Otherwise mild increased T2 signal within the plantar musculature likely neurogenic given the patient's history of diabetes. Soft tissue No fluid collection or hematoma. Mild soft tissue edema along the dorsal lateral aspect of the left foot which may be reactive versus secondary to mild cellulitis. IMPRESSION: 1. Edema and mild expansion of the proximal abductor hallucis muscle (image 14/series 8) which may be neurogenic edema versus mild myositis. 2. Ununited fracture without similar marrow edema involving the base of the fifth metatarsal. 3. Mild osteoarthritis of the first MTP joint. Electronically Signed   By: Kathreen Devoid   On: 06/08/2016 10:24     Medications:     . amitriptyline  5 mg Oral QHS  . diltiazem  120 mg Oral BID  . heparin subcutaneous  5,000 Units Subcutaneous Q12H  . hydrALAZINE  100 mg Oral BID  . hydrOXYzine  50 mg Oral QHS  . insulin aspart  0-9 Units Subcutaneous TID WC  . insulin glargine  15 Units Subcutaneous QHS  . mometasone-formoterol  2 puff Inhalation BID  . multivitamin  1 tablet Oral QHS  . pantoprazole  40 mg Oral Daily  . piperacillin-tazobactam (ZOSYN)  IV  3.375 g Intravenous Q12H  . sevelamer carbonate  1,600 mg Oral TID WC  . silver sulfADIAZINE   Topical BID  . traMADol  50 mg Oral BID  . venlafaxine XR  150 mg Oral Daily   ALPRAZolam, morphine injection, nitroGLYCERIN, ondansetron **OR** ondansetron (ZOFRAN) IV, oxyCODONE, oxyCODONE, sodium chloride  Assessment/ Plan:  50 y.o.white female with medical history of hypertension, diastolic heart failure, diabetes mellitus type 2, and anemia of chronic kidney disease, hepatitis C, depression, psoriasis, peripheral  neuropathy, ESRD, admission for left foot blister/LLE cellulitis   [facility] MWF  1.  End-stage renal disease on dialysis MWF. - Continue MWF schedule.   2. Anemia chronic and disease.   - epo with dialysis treatment  3. Secondary hyperparathyroidism. -  Continue Renvela 1600 mg by mouth 3 times a day with meals   4. Hypertension. Blood pressure at goal - Continue diltiazem and hydralazine.  5. Left foot blister with surrounding cellulitis/left lower extremity cellulitis.  - empiric zosyn and vancomycin. Will need to transition to PO   LOS: [place], Demba Nigh 6/20/201711:05 AM

## 2016-06-10 ENCOUNTER — Encounter: Payer: Self-pay | Admitting: Radiology

## 2016-06-10 ENCOUNTER — Inpatient Hospital Stay: Payer: Medicare Other

## 2016-06-10 LAB — BASIC METABOLIC PANEL
ANION GAP: 10 (ref 5–15)
BUN: 42 mg/dL — ABNORMAL HIGH (ref 6–20)
CALCIUM: 7.9 mg/dL — AB (ref 8.9–10.3)
CO2: 27 mmol/L (ref 22–32)
CREATININE: 5.35 mg/dL — AB (ref 0.44–1.00)
Chloride: 97 mmol/L — ABNORMAL LOW (ref 101–111)
GFR, EST AFRICAN AMERICAN: 10 mL/min — AB (ref >60)
GFR, EST NON AFRICAN AMERICAN: 8 mL/min — AB (ref >60)
Glucose, Bld: 157 mg/dL — ABNORMAL HIGH (ref 65–99)
Potassium: 4.6 mmol/L (ref 3.5–5.1)
SODIUM: 134 mmol/L — AB (ref 135–145)

## 2016-06-10 LAB — CBC
HEMATOCRIT: 25.8 % — AB (ref 35.0–47.0)
Hemoglobin: 8.8 g/dL — ABNORMAL LOW (ref 12.0–16.0)
MCH: 31.2 pg (ref 26.0–34.0)
MCHC: 34.1 g/dL (ref 32.0–36.0)
MCV: 91.6 fL (ref 80.0–100.0)
PLATELETS: 161 10*3/uL (ref 150–440)
RBC: 2.81 MIL/uL — ABNORMAL LOW (ref 3.80–5.20)
RDW: 14.3 % (ref 11.5–14.5)
WBC: 5.6 10*3/uL (ref 3.6–11.0)

## 2016-06-10 LAB — GLUCOSE, CAPILLARY
Glucose-Capillary: 139 mg/dL — ABNORMAL HIGH (ref 65–99)
Glucose-Capillary: 163 mg/dL — ABNORMAL HIGH (ref 65–99)

## 2016-06-10 MED ORDER — EPOETIN ALFA 10000 UNIT/ML IJ SOLN
10000.0000 [IU] | INTRAMUSCULAR | Status: DC
  Administered 2016-06-10: 10000 [IU] via INTRAVENOUS

## 2016-06-10 MED ORDER — IOPAMIDOL (ISOVUE-370) INJECTION 76%
100.0000 mL | Freq: Once | INTRAVENOUS | Status: AC | PRN
  Administered 2016-06-10: 100 mL via INTRAVENOUS
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
## 2016-06-10 NOTE — Progress Notes (Signed)
Pt. Has decided to leave AMA.  Pt states that if she stays tonight in the hospital then she runs the risk of losing where she lives.  Currently pt. Staying at the [facility] in [place], [place].  Pt is alert and oriented.  Pt. At this time is not complaining of pain.  Pt has daughter at bedside.  Daughter, Dewitt Hoes will be taking pt home in a car [place] [facility].  Dr. Claria Dice, prime doc. Has been paged and is aware of pt. Leaving AMA. Pt has read AMA form and signed.  Pt stated that she does not feel it is important for her to stay.

## 2016-06-10 NOTE — Progress Notes (Signed)
Pre-hd tx 

## 2016-06-10 NOTE — Progress Notes (Signed)
Pt c/o chest pain. Xanax and Nitro SL tab given. MD aware. Chest CT with contrast STAT ordered per Dr. Margaretmary Eddy.

## 2016-06-10 NOTE — Progress Notes (Signed)
Post hd tx 

## 2016-06-10 NOTE — Progress Notes (Signed)
Patient complaining of chest pain. VSS (128/62, 79, 94RA). Will conitnue to monitor patient.

## 2016-06-10 NOTE — Progress Notes (Signed)
[facility]  ROUNDING NOTE   Subjective:   Having chest pain and anxiety.   Objective:  Vital signs in last 24 hours:  Temp:  [97.9 F (36.6 C)-98.4 F (36.9 C)] 98 F (36.7 C) (06/21 1019) Pulse Rate:  [78-85] 79 (06/21 1019) Resp:  [16-20] 16 (06/21 1019) BP: (118-148)/(46-62) 118/46 mmHg (06/21 1019) SpO2:  [95 %-98 %] 96 % (06/21 1019) Weight:  [94.348 kg (208 lb)] 94.348 kg (208 lb) (06/21 0456)  Weight change: 4.748 kg (10 lb 7.5 oz) Filed Weights   06/08/16 1756 06/09/16 0440 06/10/16 0456  Weight: 89.6 kg (197 lb 8.5 oz) 90.039 kg (198 lb 8 oz) 94.348 kg (208 lb)    Intake/Output: I/O last 3 completed shifts: In: 1102 [P.O.:1102] Out: 2800 [Urine:2800]   Intake/Output this shift:  Total I/O In: 240 [P.O.:240] Out: -   Physical Exam: General: NAD, laying in bed  Head: Normocephalic, atraumatic. Moist oral mucosal membranes  Eyes: Anicteric  Neck: Supple, trachea midline  Lungs:  Clear to auscultation normal effort  Heart: S1S2 no rubs  Abdomen:  Soft, nontender, BS present   Extremities: Left foot in clean dressings.   Neurologic: Nonfocal, moving all four extremities  Skin: Left foot sole as above  Access: LUE AVF    Basic Metabolic Panel:  Recent Labs Lab 06/06/16 1645 06/07/16 0355 06/08/16 0355 06/08/16 1536 06/10/16 0602  NA 133* 136 132*  --  134*  K 4.2 4.4 4.8  --  4.6  CL 98* 103 99*  --  97*  CO2 24 24 22   --  27  GLUCOSE 314* 113* 208*  --  157*  BUN 32* 35* 47*  --  42*  CREATININE 4.34* 4.81* 5.97*  --  5.35*  CALCIUM 7.8* 7.5* 7.4*  --  7.9*  PHOS  --   --   --  4.8*  --     Liver Function Tests:  Recent Labs Lab 06/06/16 1645  AST 32  ALT 25  ALKPHOS 58  BILITOT 0.3  PROT 8.0  ALBUMIN 3.3*   No results for input(s): LIPASE, AMYLASE in the last 168 hours. No results for input(s): AMMONIA in the last 168 hours.  CBC:  Recent Labs Lab 06/06/16 1645 06/07/16 0355 06/08/16 0355 06/10/16 0603   WBC 5.9 4.8 5.5 5.6  NEUTROABS 4.1  --   --   --   HGB 10.4* 9.3* 8.7* 8.8*  HCT 31.2* 27.7* 26.5* 25.8*  MCV 91.9 92.2 92.4 91.6  PLT 159 148* 148* 161    Cardiac Enzymes:  Recent Labs Lab 06/07/16 0355 06/07/16 1532 06/07/16 1823  TROPONINI 0.04* <0.03 0.03    BNP: Invalid input(s): POCBNP  CBG:  Recent Labs Lab 06/09/16 1112 06/09/16 1609 06/09/16 2104 06/10/16 0731 06/10/16 1140  GLUCAP 194* 206* 203* 139* 163*    Microbiology: Results for orders placed or performed during the hospital encounter of 10/11/15  Urine culture     Status: None   Collection Time: 10/11/15  8:46 PM  Result Value Ref Range Status   Specimen Description URINE, CLEAN CATCH  Final   Special Requests NONE  Final   Culture MULTIPLE SPECIES PRESENT, SUGGEST RECOLLECTION  Final   Report Status 10/13/2015 FINAL  Final    Coagulation Studies: No results for input(s): LABPROT, INR in the last 72 hours.  Urinalysis: No results for input(s): COLORURINE, LABSPEC, PHURINE, GLUCOSEU, HGBUR, BILIRUBINUR, KETONESUR, PROTEINUR, UROBILINOGEN, NITRITE, LEUKOCYTESUR in the last 72 hours.  Invalid input(s):  APPERANCEUR    Imaging: No results found.   Medications:     . amitriptyline  5 mg Oral QHS  . amoxicillin-clavulanate  1 tablet Oral Q12H  . bisacodyl  10 mg Oral Daily  . diltiazem  120 mg Oral BID  . epoetin (EPOGEN/PROCRIT) injection  10,000 Units Intravenous Q M,W,F-HD  . heparin subcutaneous  5,000 Units Subcutaneous Q12H  . hydrALAZINE  100 mg Oral BID  . hydrOXYzine  50 mg Oral QHS  . insulin aspart  0-9 Units Subcutaneous TID WC  . insulin glargine  15 Units Subcutaneous QHS  . mometasone-formoterol  2 puff Inhalation BID  . multivitamin  1 tablet Oral QHS  . pantoprazole  40 mg Oral Daily  . senna-docusate  1 tablet Oral BID  . sevelamer carbonate  1,600 mg Oral TID WC  . silver sulfADIAZINE   Topical BID  . traMADol  50 mg Oral BID  . venlafaxine XR  150 mg Oral  Daily   ALPRAZolam, morphine injection, nitroGLYCERIN, ondansetron **OR** ondansetron (ZOFRAN) IV, oxyCODONE, oxyCODONE, sodium chloride  Assessment/ Plan:  50 y.o.white female with medical history of hypertension, diastolic heart failure, diabetes mellitus type 2, and anemia of chronic kidney disease, hepatitis C, depression, psoriasis, peripheral neuropathy, ESRD, admission for left foot blister/LLE cellulitis   [facility] MWF  1.  End-stage renal disease on dialysis MWF. - Continue MWF schedule. Dialysis for later today.   2. Anemia chronic and disease: hemoglobin 8.8 - epo with dialysis treatment has been ordered. However if having ischemia, will hold epo.   3. Secondary hyperparathyroidism. -  Continue Renvela 1600 mg by mouth 3 times a day with meals   4. Hypertension. Blood pressure at goal - Continue diltiazem and hydralazine.  5. Left lower extremity cellulitis.  - Augmentin   LOS: [place], [place] 6/21/201712:10 PM

## 2016-06-10 NOTE — Progress Notes (Signed)
Hemodialysis tx started.

## 2016-06-10 NOTE — Progress Notes (Signed)
Hemodialysis completed. 

## 2016-06-10 NOTE — Progress Notes (Addendum)
Pt returned from HD approx. 530pm. Report called by Lavella Lemons, RN.  Pt tolerated HD well, removed 1.5L fluid, epoetin given. VS stable. Dressing changed to left foot, serosanguinous drainage continues. Fitted for post-op shoe per orders, size large.  Pt awaiting results of Chest CT, pending cardiology consult for discharge. Verified with Prime Doc. Pt eager to leave this evening, family at bedside. Explained to pt awaiting results and consult prior to discharge. Pt compliant, will readdress in AM.

## 2016-06-10 NOTE — Progress Notes (Signed)
[place] [facility] [place] NAME: [name]    MR#:  [id number]  DATE OF BIRTH:  Nov 22, 1966  SUBJECTIVE:  CHIEF COMPLAINT:  Patient Had large bowel movement yesterday and her acute urinary retention is resolved  Persistently reporting intermittent episodes of chest pain, though troponins are negative  REVIEW OF SYSTEMS:  CONSTITUTIONAL: No fever, fatigue or weakness.  EYES: No blurred or double vision.  EARS, NOSE, AND THROAT: No tinnitus or ear pain.  RESPIRATORY: No cough, shortness of breath, wheezing or hemoptysis.  CARDIOVASCULAR:Reporting chest discomfort. No orthopnea, edema.  GASTROINTESTINAL: No nausea, vomiting, diarrhea or abdominal pain.  GENITOURINARY: No dysuria, hematuria.  ENDOCRINE: No polyuria, nocturia,  HEMATOLOGY: No anemia, easy bruising or bleeding SKIN: No rash or lesion. MUSCULOSKELETAL: No joint pain or arthritis.   NEUROLOGIC: No tingling, numbness, weakness.  PSYCHIATRY: No anxiety or depression.   DRUG ALLERGIES:   Allergies  Allergen Reactions  . Cinnamon Anaphylaxis  . Cinoxate Anaphylaxis  . Tylenol [Acetaminophen] Anaphylaxis  . Garlic Hives  . Onion Hives and Swelling    VITALS:  Blood pressure 142/71, pulse 79, temperature 98 F (36.7 C), temperature source Oral, resp. rate 13, height 5\' 5"  (1.651 m), weight 94.348 kg (208 lb), SpO2 98 %.  PHYSICAL EXAMINATION:  GENERAL:  50 y.o.-year-old patient lying in the bed with no acute distress.  EYES: Pupils equal, round, reactive to light and accommodation. No scleral icterus. Extraocular muscles intact.  HEENT: Head atraumatic, normocephalic. Oropharynx and nasopharynx clear.  NECK:  Supple, no jugular venous distention. No thyroid enlargement, no tenderness.  LUNGS: Normal breath sounds bilaterally, no wheezing, rales,rhonchi or crepitation. No use of accessory muscles of respiration.  CARDIOVASCULAR: S1, S2 normal. No murmurs, rubs, or gallops.   ABDOMEN: Soft, nontender, nondistended. Bowel sounds present. No organomegaly or mass.  EXTREMITIES: Left lower extremity redness and swelling improved. The open wound well demarcated on the left plantar foot area without acute drainage. Looks like a first to second degree burn. +2 pulses bilaterally no cyanosis bilaterally.  No pedal edema, cyanosis, or clubbing.  NEUROLOGIC: Cranial nerves II through XII are intact. Muscle strength 5/5 in all extremities. Sensation intact. Gait not checked.  PSYCHIATRIC: The patient is alert and oriented x 3.  SKIN: No obvious rash, lesion, or ulcer.    LABORATORY PANEL:   CBC  Recent Labs Lab 06/10/16 0603  WBC 5.6  HGB 8.8*  HCT 25.8*  PLT 161   ------------------------------------------------------------------------------------------------------------------  Chemistries   Recent Labs Lab 06/06/16 1645  06/10/16 0602  NA 133*  < > 134*  K 4.2  < > 4.6  CL 98*  < > 97*  CO2 24  < > 27  GLUCOSE 314*  < > 157*  BUN 32*  < > 42*  CREATININE 4.34*  < > 5.35*  CALCIUM 7.8*  < > 7.9*  AST 32  --   --   ALT 25  --   --   ALKPHOS 58  --   --   BILITOT 0.3  --   --   < > = values in this interval not displayed. ------------------------------------------------------------------------------------------------------------------  Cardiac Enzymes  Recent Labs Lab 06/07/16 1823  TROPONINI 0.03   ------------------------------------------------------------------------------------------------------------------  RADIOLOGY:  Ct Angio Chest Pe W Or Wo Contrast  06/10/2016  CLINICAL DATA:  Chest pain today. EXAM: CT ANGIOGRAPHY CHEST WITH CONTRAST TECHNIQUE: Multidetector CT imaging of the chest was performed using the standard protocol during bolus administration  of intravenous contrast. Multiplanar CT image reconstructions and MIPs were obtained to evaluate the vascular anatomy. CONTRAST:  100 cc Isovue 370. COMPARISON:  PA and lateral chest  04/06/2016. FINDINGS: No pulmonary embolus is identified. Bovine aortic arch is incidentally noted. There is mild cardiomegaly. Trace bilateral pleural effusions are seen, larger on the left. No pericardial effusion. Calcific aortic and coronary atherosclerosis is identified. No axillary, hilar or mediastinal lymphadenopathy. The lungs show mild dependent atelectasis. Visualized upper abdomen demonstrates postoperative change of cholecystectomy. No focal bony abnormality is identified. Review of the MIP images confirms the above findings. IMPRESSION: Negative for pulmonary embolus. Tiny bilateral pleural effusions. Calcific aortic and coronary atherosclerosis. Mild cardiomegaly without edema. Electronically Signed   By: Inge Rise M.D.   On: 06/10/2016 13:06    EKG:   Orders placed or performed during the hospital encounter of 06/06/16  . EKG 12-Lead  . EKG 12-Lead  . EKG 12-Lead  . EKG 12-Lead    ASSESSMENT AND PLAN:   50 year old female with past medical history of end-stage renal disease on hemodialysis, diabetic neuropathy, diabetes, anxiety/depression, osteoarthritis, COPD, history of CHF who presents to the hospital due to left lower extremity redness swelling and pain.  #. Left lower extremity cellulitis-s cause of patient's redness swelling and pain. -This is  secondary to the left plantar blister/ first to second-degree burn. - IV  Zosyn changed to by mouth Augmentin Clinically improving DC vancomycin.   -Cultures never done as documented in the history and physical -Follow-up with wound team . Follow clinically. -Silvadene cream -Patient does have a nondisplaced fracture of the base of the fifth metatarsal but seems chronic. If needed consider podiatry consult. -Consult podiatry for the left sole blister/for possible orthotic shoe   #Acute urinary retention probably from constipation Constipation and subsequently acute urinary retention both are resolved   #Chest  pain-intermittent episodes CT and rhythm is negative for PE Cardiac consult is pending 2 troponins are negative and ruled out acute MI   #. COPD-no acute exacerbation. Continue Dulera.  # Essential hypertension-continue Cardizem and hydralazine.  #. Diabetes type 2 with renal complications-continue Lantus, NovoLog with meals.  #. Secondary hyperparathyroidism-continue Renvela.  #. End-stage renal disease on hemodialysis-patient gets dialysis on Monday Wednesday Friday. Follow-up with nephrology.  #. Depression-continue Effexor, Elavil.  No suicidal or homicidal ideations at this time     All the records are reviewed and case discussed with Care Management/Social Workerr. Management plans discussed with the patient, family and they are in agreement.  CODE STATUS: fc   TOTAL TIME TAKING CARE OF THIS PATIENT: 36 minutes.   POSSIBLE D/C IN 2-3 DAYS, DEPENDING ON CLINICAL CONDITION.  Note: This dictation was prepared with Dragon dictation along with smaller phrase technology. Any transcriptional errors that result from this process are unintentional.   Nicholes Mango M.D on 06/10/2016 at 3:09 PM  Between 7am to 6pm - Pager - [phone] After 6pm go to [email or web address] - password [email or web address]  Office  [phone]  CC: Primary care physician; Tula Nakayama, MD

## 2016-06-12 DIAGNOSIS — E1122 Type 2 diabetes mellitus with diabetic chronic kidney disease: Secondary | ICD-10-CM | POA: Diagnosis not present

## 2016-06-12 DIAGNOSIS — L03116 Cellulitis of left lower limb: Secondary | ICD-10-CM | POA: Diagnosis not present

## 2016-06-12 DIAGNOSIS — M1990 Unspecified osteoarthritis, unspecified site: Secondary | ICD-10-CM | POA: Diagnosis not present

## 2016-06-12 DIAGNOSIS — T25222D Burn of second degree of left foot, subsequent encounter: Secondary | ICD-10-CM | POA: Diagnosis not present

## 2016-06-12 DIAGNOSIS — D649 Anemia, unspecified: Secondary | ICD-10-CM | POA: Diagnosis not present

## 2016-06-12 DIAGNOSIS — F329 Major depressive disorder, single episode, unspecified: Secondary | ICD-10-CM | POA: Diagnosis not present

## 2016-06-12 DIAGNOSIS — I120 Hypertensive chronic kidney disease with stage 5 chronic kidney disease or end stage renal disease: Secondary | ICD-10-CM | POA: Diagnosis not present

## 2016-06-12 DIAGNOSIS — N186 End stage renal disease: Secondary | ICD-10-CM | POA: Diagnosis not present

## 2016-06-12 DIAGNOSIS — J449 Chronic obstructive pulmonary disease, unspecified: Secondary | ICD-10-CM | POA: Diagnosis not present

## 2016-06-15 DIAGNOSIS — D509 Iron deficiency anemia, unspecified: Secondary | ICD-10-CM | POA: Diagnosis not present

## 2016-06-15 DIAGNOSIS — N2581 Secondary hyperparathyroidism of renal origin: Secondary | ICD-10-CM | POA: Diagnosis not present

## 2016-06-15 DIAGNOSIS — Z992 Dependence on renal dialysis: Secondary | ICD-10-CM | POA: Diagnosis not present

## 2016-06-15 DIAGNOSIS — N186 End stage renal disease: Secondary | ICD-10-CM | POA: Diagnosis not present

## 2016-06-15 DIAGNOSIS — D631 Anemia in chronic kidney disease: Secondary | ICD-10-CM | POA: Diagnosis not present

## 2016-06-16 DIAGNOSIS — E1122 Type 2 diabetes mellitus with diabetic chronic kidney disease: Secondary | ICD-10-CM | POA: Diagnosis not present

## 2016-06-16 DIAGNOSIS — N186 End stage renal disease: Secondary | ICD-10-CM | POA: Diagnosis not present

## 2016-06-16 DIAGNOSIS — J449 Chronic obstructive pulmonary disease, unspecified: Secondary | ICD-10-CM | POA: Diagnosis not present

## 2016-06-16 DIAGNOSIS — T25222D Burn of second degree of left foot, subsequent encounter: Secondary | ICD-10-CM | POA: Diagnosis not present

## 2016-06-16 DIAGNOSIS — L03116 Cellulitis of left lower limb: Secondary | ICD-10-CM | POA: Diagnosis not present

## 2016-06-16 DIAGNOSIS — I120 Hypertensive chronic kidney disease with stage 5 chronic kidney disease or end stage renal disease: Secondary | ICD-10-CM | POA: Diagnosis not present

## 2016-06-17 DIAGNOSIS — N186 End stage renal disease: Secondary | ICD-10-CM | POA: Diagnosis not present

## 2016-06-17 DIAGNOSIS — N2581 Secondary hyperparathyroidism of renal origin: Secondary | ICD-10-CM | POA: Diagnosis not present

## 2016-06-17 DIAGNOSIS — D631 Anemia in chronic kidney disease: Secondary | ICD-10-CM | POA: Diagnosis not present

## 2016-06-17 DIAGNOSIS — D509 Iron deficiency anemia, unspecified: Secondary | ICD-10-CM | POA: Diagnosis not present

## 2016-06-17 DIAGNOSIS — Z992 Dependence on renal dialysis: Secondary | ICD-10-CM | POA: Diagnosis not present

## 2016-06-18 DIAGNOSIS — L03116 Cellulitis of left lower limb: Secondary | ICD-10-CM | POA: Diagnosis not present

## 2016-06-18 DIAGNOSIS — L905 Scar conditions and fibrosis of skin: Secondary | ICD-10-CM | POA: Diagnosis not present

## 2016-06-18 DIAGNOSIS — J449 Chronic obstructive pulmonary disease, unspecified: Secondary | ICD-10-CM | POA: Diagnosis not present

## 2016-06-18 DIAGNOSIS — N186 End stage renal disease: Secondary | ICD-10-CM | POA: Diagnosis not present

## 2016-06-18 DIAGNOSIS — E1122 Type 2 diabetes mellitus with diabetic chronic kidney disease: Secondary | ICD-10-CM | POA: Diagnosis not present

## 2016-06-18 DIAGNOSIS — E119 Type 2 diabetes mellitus without complications: Secondary | ICD-10-CM | POA: Diagnosis not present

## 2016-06-18 DIAGNOSIS — T25222D Burn of second degree of left foot, subsequent encounter: Secondary | ICD-10-CM | POA: Diagnosis not present

## 2016-06-18 DIAGNOSIS — I120 Hypertensive chronic kidney disease with stage 5 chronic kidney disease or end stage renal disease: Secondary | ICD-10-CM | POA: Diagnosis not present

## 2016-06-19 DIAGNOSIS — D631 Anemia in chronic kidney disease: Secondary | ICD-10-CM | POA: Diagnosis not present

## 2016-06-19 DIAGNOSIS — N186 End stage renal disease: Secondary | ICD-10-CM | POA: Diagnosis not present

## 2016-06-19 DIAGNOSIS — N2581 Secondary hyperparathyroidism of renal origin: Secondary | ICD-10-CM | POA: Diagnosis not present

## 2016-06-19 DIAGNOSIS — D509 Iron deficiency anemia, unspecified: Secondary | ICD-10-CM | POA: Diagnosis not present

## 2016-06-19 DIAGNOSIS — Z992 Dependence on renal dialysis: Secondary | ICD-10-CM | POA: Diagnosis not present

## 2016-06-22 DIAGNOSIS — N186 End stage renal disease: Secondary | ICD-10-CM | POA: Diagnosis not present

## 2016-06-22 DIAGNOSIS — T25222D Burn of second degree of left foot, subsequent encounter: Secondary | ICD-10-CM | POA: Diagnosis not present

## 2016-06-22 DIAGNOSIS — L0201 Cutaneous abscess of face: Secondary | ICD-10-CM | POA: Diagnosis not present

## 2016-06-22 DIAGNOSIS — E1122 Type 2 diabetes mellitus with diabetic chronic kidney disease: Secondary | ICD-10-CM | POA: Diagnosis not present

## 2016-06-22 DIAGNOSIS — S01159A Open bite of unspecified eyelid and periocular area, initial encounter: Secondary | ICD-10-CM | POA: Diagnosis not present

## 2016-06-22 DIAGNOSIS — B9689 Other specified bacterial agents as the cause of diseases classified elsewhere: Secondary | ICD-10-CM | POA: Diagnosis not present

## 2016-06-22 DIAGNOSIS — D509 Iron deficiency anemia, unspecified: Secondary | ICD-10-CM | POA: Diagnosis not present

## 2016-06-22 DIAGNOSIS — L03116 Cellulitis of left lower limb: Secondary | ICD-10-CM | POA: Diagnosis not present

## 2016-06-22 DIAGNOSIS — Z992 Dependence on renal dialysis: Secondary | ICD-10-CM | POA: Diagnosis not present

## 2016-06-22 DIAGNOSIS — D631 Anemia in chronic kidney disease: Secondary | ICD-10-CM | POA: Diagnosis not present

## 2016-06-22 DIAGNOSIS — J449 Chronic obstructive pulmonary disease, unspecified: Secondary | ICD-10-CM | POA: Diagnosis not present

## 2016-06-22 DIAGNOSIS — I120 Hypertensive chronic kidney disease with stage 5 chronic kidney disease or end stage renal disease: Secondary | ICD-10-CM | POA: Diagnosis not present

## 2016-06-24 DIAGNOSIS — L0201 Cutaneous abscess of face: Secondary | ICD-10-CM | POA: Diagnosis not present

## 2016-06-24 DIAGNOSIS — D631 Anemia in chronic kidney disease: Secondary | ICD-10-CM | POA: Diagnosis not present

## 2016-06-24 DIAGNOSIS — S01159A Open bite of unspecified eyelid and periocular area, initial encounter: Secondary | ICD-10-CM | POA: Diagnosis not present

## 2016-06-24 DIAGNOSIS — Z992 Dependence on renal dialysis: Secondary | ICD-10-CM | POA: Diagnosis not present

## 2016-06-24 DIAGNOSIS — N186 End stage renal disease: Secondary | ICD-10-CM | POA: Diagnosis not present

## 2016-06-24 DIAGNOSIS — D509 Iron deficiency anemia, unspecified: Secondary | ICD-10-CM | POA: Diagnosis not present

## 2016-06-25 DIAGNOSIS — I120 Hypertensive chronic kidney disease with stage 5 chronic kidney disease or end stage renal disease: Secondary | ICD-10-CM | POA: Diagnosis not present

## 2016-06-25 DIAGNOSIS — E1122 Type 2 diabetes mellitus with diabetic chronic kidney disease: Secondary | ICD-10-CM | POA: Diagnosis not present

## 2016-06-25 DIAGNOSIS — L409 Psoriasis, unspecified: Secondary | ICD-10-CM | POA: Diagnosis not present

## 2016-06-25 DIAGNOSIS — I10 Essential (primary) hypertension: Secondary | ICD-10-CM | POA: Diagnosis not present

## 2016-06-25 DIAGNOSIS — G894 Chronic pain syndrome: Secondary | ICD-10-CM | POA: Diagnosis not present

## 2016-06-25 DIAGNOSIS — J449 Chronic obstructive pulmonary disease, unspecified: Secondary | ICD-10-CM | POA: Diagnosis not present

## 2016-06-25 DIAGNOSIS — T25222D Burn of second degree of left foot, subsequent encounter: Secondary | ICD-10-CM | POA: Diagnosis not present

## 2016-06-25 DIAGNOSIS — L905 Scar conditions and fibrosis of skin: Secondary | ICD-10-CM | POA: Diagnosis not present

## 2016-06-25 DIAGNOSIS — N186 End stage renal disease: Secondary | ICD-10-CM | POA: Diagnosis not present

## 2016-06-25 DIAGNOSIS — L03116 Cellulitis of left lower limb: Secondary | ICD-10-CM | POA: Diagnosis not present

## 2016-06-25 DIAGNOSIS — F339 Major depressive disorder, recurrent, unspecified: Secondary | ICD-10-CM | POA: Diagnosis not present

## 2016-06-26 DIAGNOSIS — D631 Anemia in chronic kidney disease: Secondary | ICD-10-CM | POA: Diagnosis not present

## 2016-06-26 DIAGNOSIS — Z992 Dependence on renal dialysis: Secondary | ICD-10-CM | POA: Diagnosis not present

## 2016-06-26 DIAGNOSIS — L0201 Cutaneous abscess of face: Secondary | ICD-10-CM | POA: Diagnosis not present

## 2016-06-26 DIAGNOSIS — N186 End stage renal disease: Secondary | ICD-10-CM | POA: Diagnosis not present

## 2016-06-26 DIAGNOSIS — D509 Iron deficiency anemia, unspecified: Secondary | ICD-10-CM | POA: Diagnosis not present

## 2016-06-26 DIAGNOSIS — S01159A Open bite of unspecified eyelid and periocular area, initial encounter: Secondary | ICD-10-CM | POA: Diagnosis not present

## 2016-06-29 DIAGNOSIS — S01159A Open bite of unspecified eyelid and periocular area, initial encounter: Secondary | ICD-10-CM | POA: Diagnosis not present

## 2016-06-29 DIAGNOSIS — Z992 Dependence on renal dialysis: Secondary | ICD-10-CM | POA: Diagnosis not present

## 2016-06-29 DIAGNOSIS — D631 Anemia in chronic kidney disease: Secondary | ICD-10-CM | POA: Diagnosis not present

## 2016-06-29 DIAGNOSIS — N186 End stage renal disease: Secondary | ICD-10-CM | POA: Diagnosis not present

## 2016-06-29 DIAGNOSIS — L0201 Cutaneous abscess of face: Secondary | ICD-10-CM | POA: Diagnosis not present

## 2016-06-29 DIAGNOSIS — D509 Iron deficiency anemia, unspecified: Secondary | ICD-10-CM | POA: Diagnosis not present

## 2016-06-30 DIAGNOSIS — L03116 Cellulitis of left lower limb: Secondary | ICD-10-CM | POA: Diagnosis not present

## 2016-06-30 DIAGNOSIS — N186 End stage renal disease: Secondary | ICD-10-CM | POA: Diagnosis not present

## 2016-06-30 DIAGNOSIS — J449 Chronic obstructive pulmonary disease, unspecified: Secondary | ICD-10-CM | POA: Diagnosis not present

## 2016-06-30 DIAGNOSIS — T25222D Burn of second degree of left foot, subsequent encounter: Secondary | ICD-10-CM | POA: Diagnosis not present

## 2016-06-30 DIAGNOSIS — E1122 Type 2 diabetes mellitus with diabetic chronic kidney disease: Secondary | ICD-10-CM | POA: Diagnosis not present

## 2016-06-30 DIAGNOSIS — I120 Hypertensive chronic kidney disease with stage 5 chronic kidney disease or end stage renal disease: Secondary | ICD-10-CM | POA: Diagnosis not present

## 2016-07-02 DIAGNOSIS — N186 End stage renal disease: Secondary | ICD-10-CM | POA: Diagnosis not present

## 2016-07-02 DIAGNOSIS — J449 Chronic obstructive pulmonary disease, unspecified: Secondary | ICD-10-CM | POA: Diagnosis not present

## 2016-07-02 DIAGNOSIS — E1122 Type 2 diabetes mellitus with diabetic chronic kidney disease: Secondary | ICD-10-CM | POA: Diagnosis not present

## 2016-07-02 DIAGNOSIS — L03116 Cellulitis of left lower limb: Secondary | ICD-10-CM | POA: Diagnosis not present

## 2016-07-02 DIAGNOSIS — I120 Hypertensive chronic kidney disease with stage 5 chronic kidney disease or end stage renal disease: Secondary | ICD-10-CM | POA: Diagnosis not present

## 2016-07-02 DIAGNOSIS — T25222D Burn of second degree of left foot, subsequent encounter: Secondary | ICD-10-CM | POA: Diagnosis not present

## 2016-07-03 DIAGNOSIS — D509 Iron deficiency anemia, unspecified: Secondary | ICD-10-CM | POA: Diagnosis not present

## 2016-07-03 DIAGNOSIS — S01159A Open bite of unspecified eyelid and periocular area, initial encounter: Secondary | ICD-10-CM | POA: Diagnosis not present

## 2016-07-03 DIAGNOSIS — N186 End stage renal disease: Secondary | ICD-10-CM | POA: Diagnosis not present

## 2016-07-03 DIAGNOSIS — L0201 Cutaneous abscess of face: Secondary | ICD-10-CM | POA: Diagnosis not present

## 2016-07-03 DIAGNOSIS — Z992 Dependence on renal dialysis: Secondary | ICD-10-CM | POA: Diagnosis not present

## 2016-07-03 DIAGNOSIS — D631 Anemia in chronic kidney disease: Secondary | ICD-10-CM | POA: Diagnosis not present

## 2016-07-06 DIAGNOSIS — N186 End stage renal disease: Secondary | ICD-10-CM | POA: Diagnosis not present

## 2016-07-06 DIAGNOSIS — D631 Anemia in chronic kidney disease: Secondary | ICD-10-CM | POA: Diagnosis not present

## 2016-07-06 DIAGNOSIS — L0201 Cutaneous abscess of face: Secondary | ICD-10-CM | POA: Diagnosis not present

## 2016-07-06 DIAGNOSIS — S01159A Open bite of unspecified eyelid and periocular area, initial encounter: Secondary | ICD-10-CM | POA: Diagnosis not present

## 2016-07-06 DIAGNOSIS — Z992 Dependence on renal dialysis: Secondary | ICD-10-CM | POA: Diagnosis not present

## 2016-07-06 DIAGNOSIS — D509 Iron deficiency anemia, unspecified: Secondary | ICD-10-CM | POA: Diagnosis not present

## 2016-07-07 DIAGNOSIS — N186 End stage renal disease: Secondary | ICD-10-CM | POA: Diagnosis not present

## 2016-07-07 DIAGNOSIS — I120 Hypertensive chronic kidney disease with stage 5 chronic kidney disease or end stage renal disease: Secondary | ICD-10-CM | POA: Diagnosis not present

## 2016-07-07 DIAGNOSIS — E1122 Type 2 diabetes mellitus with diabetic chronic kidney disease: Secondary | ICD-10-CM | POA: Diagnosis not present

## 2016-07-07 DIAGNOSIS — T25222D Burn of second degree of left foot, subsequent encounter: Secondary | ICD-10-CM | POA: Diagnosis not present

## 2016-07-07 DIAGNOSIS — L03116 Cellulitis of left lower limb: Secondary | ICD-10-CM | POA: Diagnosis not present

## 2016-07-07 DIAGNOSIS — J449 Chronic obstructive pulmonary disease, unspecified: Secondary | ICD-10-CM | POA: Diagnosis not present

## 2016-07-08 DIAGNOSIS — S01159A Open bite of unspecified eyelid and periocular area, initial encounter: Secondary | ICD-10-CM | POA: Diagnosis not present

## 2016-07-08 DIAGNOSIS — N186 End stage renal disease: Secondary | ICD-10-CM | POA: Diagnosis not present

## 2016-07-08 DIAGNOSIS — D509 Iron deficiency anemia, unspecified: Secondary | ICD-10-CM | POA: Diagnosis not present

## 2016-07-08 DIAGNOSIS — L0201 Cutaneous abscess of face: Secondary | ICD-10-CM | POA: Diagnosis not present

## 2016-07-08 DIAGNOSIS — Z992 Dependence on renal dialysis: Secondary | ICD-10-CM | POA: Diagnosis not present

## 2016-07-08 DIAGNOSIS — D631 Anemia in chronic kidney disease: Secondary | ICD-10-CM | POA: Diagnosis not present

## 2016-07-09 DIAGNOSIS — N186 End stage renal disease: Secondary | ICD-10-CM | POA: Diagnosis not present

## 2016-07-09 DIAGNOSIS — E1122 Type 2 diabetes mellitus with diabetic chronic kidney disease: Secondary | ICD-10-CM | POA: Diagnosis not present

## 2016-07-09 DIAGNOSIS — T25222D Burn of second degree of left foot, subsequent encounter: Secondary | ICD-10-CM | POA: Diagnosis not present

## 2016-07-09 DIAGNOSIS — L03116 Cellulitis of left lower limb: Secondary | ICD-10-CM | POA: Diagnosis not present

## 2016-07-09 DIAGNOSIS — J449 Chronic obstructive pulmonary disease, unspecified: Secondary | ICD-10-CM | POA: Diagnosis not present

## 2016-07-09 DIAGNOSIS — I120 Hypertensive chronic kidney disease with stage 5 chronic kidney disease or end stage renal disease: Secondary | ICD-10-CM | POA: Diagnosis not present

## 2016-07-10 DIAGNOSIS — L0201 Cutaneous abscess of face: Secondary | ICD-10-CM | POA: Diagnosis not present

## 2016-07-10 DIAGNOSIS — Z992 Dependence on renal dialysis: Secondary | ICD-10-CM | POA: Diagnosis not present

## 2016-07-10 DIAGNOSIS — D631 Anemia in chronic kidney disease: Secondary | ICD-10-CM | POA: Diagnosis not present

## 2016-07-10 DIAGNOSIS — D509 Iron deficiency anemia, unspecified: Secondary | ICD-10-CM | POA: Diagnosis not present

## 2016-07-10 DIAGNOSIS — S01159A Open bite of unspecified eyelid and periocular area, initial encounter: Secondary | ICD-10-CM | POA: Diagnosis not present

## 2016-07-10 DIAGNOSIS — N186 End stage renal disease: Secondary | ICD-10-CM | POA: Diagnosis not present

## 2016-07-11 NOTE — Discharge Summary (Signed)
[place] [facility] [facility] [place] NAME: [name]    MR#:  [id number]  DATE OF BIRTH:  1966-07-19  DATE OF ADMISSION:  06/06/2016 ADMITTING PHYSICIAN: Henreitta Leber, MD  DATE OF DISCHARGE: 06/10/2016  9:20 PM  PRIMARY CARE PHYSICIAN: Tula Nakayama, MD    ADMISSION DIAGNOSIS:  ESRD (end stage renal disease) on dialysis ([place]) [N18.6, Z99.2] Cellulitis of left foot [L03.116] Insulin dependent diabetes mellitus ([place]) [E11.9, Z79.4] Left foot burn, second degree, subsequent encounter [T25.222D] Cellulitis of left anterior lower leg [id number]  DISCHARGE DIAGNOSIS:  Active Problems:   Cellulitis acute urinary retention from constipation   SECONDARY DIAGNOSIS:   Past Medical History  Diagnosis Date  . Hypertension   . CHF (congestive heart failure) ([place])   . COPD (chronic obstructive pulmonary disease) ([place])   . Diabetes mellitus without complication ([place])   . Depression   . Anxiety   . Chronic kidney disease   . Arthritis   . Anemia   . Hepatitis   . Tobacco dependence   . Emphysema of lung ([place])   . Hypercholesterolemia   . End stage renal disease [facility])     HOSPITAL COURSE:   50 year old female with past medical history of end-stage renal disease on hemodialysis, diabetic neuropathy, diabetes, anxiety/depression, osteoarthritis, COPD, history of CHF who presents to the hospital due to left lower extremity redness swelling and pain.  #. Left lower extremity cellulitis-s cause of patient's redness swelling and pain. -This is secondary to the left plantar blister/ first to second-degree burn. - IV Zosyn changed to by mouth Augmentin Clinically improving DC vancomycin.  -Cultures never done as documented in the history and physical -Follow-up with wound team . Follow clinically. -Silvadene cream -Patient does have a nondisplaced fracture of the base of the fifth metatarsal but seems chronic. If needed consider podiatry  consult. -Consult podiatry for the left sole blister/for possible orthotic shoe   #Acute urinary retention probably from constipation Constipation and subsequently acute urinary retention both are resolved   #Chest pain-intermittent episodes CT and rhythm is negative for PE Cardiac consult is pending, pt left hospital ama 2 troponins are negative and ruled out acute MI   #. COPD-no acute exacerbation. Continue Dulera.  # Essential hypertension-continue Cardizem and hydralazine.  #. Diabetes type 2 with renal complications-continue Lantus, NovoLog with meals.  #. Secondary hyperparathyroidism-continue Renvela.  #. End-stage renal disease on hemodialysis-patient gets dialysis on Monday Wednesday Friday. Follow-up with nephrology.  #. Depression-continue Effexor, Elavil.  No suicidal or homicidal ideations at this time   DISCHARGE CONDITIONS:   Left hospital ama  CONSULTS OBTAINED:  Treatment Team:  Nicholes Mango, MD   PROCEDURES  none  DRUG ALLERGIES:   Allergies  Allergen Reactions  . Cinnamon Anaphylaxis  . Cinoxate Anaphylaxis  . Tylenol [Acetaminophen] Anaphylaxis  . Garlic Hives  . Onion Hives and Swelling    DISCHARGE MEDICATIONS:   Discharge Medication List as of 06/10/2016  9:51 PM    CONTINUE these medications which have NOT CHANGED   Details  amitriptyline (ELAVIL) 10 MG tablet Take 5 mg by mouth at bedtime. Reported on 12/17/2015, Until Discontinued, Historical Med    b complex-vitamin c-folic acid (NEPHRO-VITE) 0.8 MG TABS tablet Take 1 tablet by mouth daily., Until Discontinued, Historical Med    diltiazem (CARDIZEM SR) 120 MG 12 hr capsule Take 120 mg by mouth 2 (two) times daily., Until Discontinued, Historical Med    Fluticasone-Salmeterol (ADVAIR) 250-50 MCG/DOSE  AEPB Inhale 1 puff into the lungs 2 (two) times daily., Until Discontinued, Historical Med    hydrALAZINE (APRESOLINE) 100 MG tablet Take 100 mg by mouth 2 (two) times daily.,  Until Discontinued, Historical Med    hydrOXYzine (ATARAX/VISTARIL) 25 MG tablet Take 50 mg by mouth at bedtime., Until Discontinued, Historical Med    insulin glargine (LANTUS) 100 UNIT/ML injection Inject 15 Units into the skin at bedtime. , Until Discontinued, Historical Med    insulin regular (NOVOLIN R,HUMULIN R) 100 units/mL injection Inject 5-10 Units into the skin 3 (three) times daily before meals. Pt uses per sliding scale., Until Discontinued, Historical Med    omeprazole (PRILOSEC) 40 MG capsule Take 40 mg by mouth daily., Until Discontinued, Historical Med    Oxycodone HCl 10 MG TABS Take 10 mg by mouth 4 (four) times daily as needed (for pain)., Until Discontinued, Historical Med    sevelamer carbonate (RENVELA) 800 MG tablet Take 1,600 mg by mouth 3 (three) times daily with meals., Until Discontinued, Historical Med    silver sulfADIAZINE (SILVADENE) 1 % cream Apply to affected area daily, Print    sulfamethoxazole-trimethoprim (BACTRIM DS,SEPTRA DS) 800-160 MG tablet Take 1 tablet by mouth 2 (two) times daily., Until Discontinued, Historical Med    traMADol (ULTRAM) 50 MG tablet Take 1 tablet (50 mg total) by mouth 2 (two) times daily., Starting 05/31/2016, Until Discontinued, Print    venlafaxine XR (EFFEXOR-XR) 75 MG 24 hr capsule Take 150 mg by mouth daily., Until Discontinued, Historical Med    DULoxetine (CYMBALTA) 60 MG capsule Take 1 capsule (60 mg total) by mouth daily., Starting 10/12/2015, Until Discontinued, Print    metoprolol tartrate (LOPRESSOR) 25 MG tablet Take 25 mg by mouth 2 (two) times daily. Reported on 06/06/2016, Until Discontinued, Historical Med    triamcinolone cream (KENALOG) 0.1 % Apply 1 application topically as needed (for psoriasis)., Until Discontinued, Historical Med       Pt left the hospital ama  DISCHARGE INSTRUCTIONS:   Pt left the hospital ama  DIET:  Pt left the hospital ama  DISCHARGE CONDITION:   Pt left the hospital  ama ACTIVITY:  Pt left the hospital ama  OXYGEN:  Home Oxygen: no   Oxygen Delivery: no  DISCHARGE LOCATION:  Pt left the hospital ama  If you experience worsening of your admission symptoms, develop shortness of breath, life threatening emergency, suicidal or homicidal thoughts you must seek medical attention immediately by calling 911 or calling your MD immediately  if symptoms less severe.  You Must read complete instructions/literature along with all the possible adverse reactions/side effects for all the Medicines you take and that have been prescribed to you. Take any new Medicines after you have completely understood and accpet all the possible adverse reactions/side effects.   Please note  You were cared for by a hospitalist during your hospital stay. If you have any questions about your discharge medications or the care you received while you were in the hospital after you are discharged, you can call the unit and asked to speak with the hospitalist on call if the hospitalist that took care of you is not available. Once you are discharged, your primary care physician will handle any further medical issues. Please note that NO REFILLS for any discharge medications will be authorized once you are discharged, as it is imperative that you return to your primary care physician (or establish a relationship with a primary care physician if you do not have one)  for your aftercare needs so that they can reassess your need for medications and monitor your lab values.     Today  Chief Complaint  Patient presents with  . Foot Burn   Pt left the hospital ama after my evaluation . Please see progress notes 06/10/16     VITAL SIGNS:  Blood pressure 142/72, pulse 89, temperature 98.4 F (36.9 C), temperature source Oral, resp. rate 18, height 5\' 5"  (1.651 m), weight 94.348 kg (208 lb), SpO2 95 %.  I/O:  No intake or output data in the 24 hours ending 07/11/16 1741  PHYSICAL  EXAMINATION:  GENERAL:  50 y.o.-year-old patient lying in the bed with no acute distress.  EYES: Pupils equal, round, reactive to light and accommodation. No scleral icterus. Extraocular muscles intact.  HEENT: Head atraumatic, normocephalic. Oropharynx and nasopharynx clear.  NECK:  Supple, no jugular venous distention. No thyroid enlargement, no tenderness.  LUNGS: Normal breath sounds bilaterally, no wheezing, rales,rhonchi or crepitation. No use of accessory muscles of respiration.  CARDIOVASCULAR: S1, S2 normal. No murmurs, rubs, or gallops.  ABDOMEN: Soft, non-tender, non-distended. Bowel sounds present. No organomegaly or mass.  EXTREMITIES: cellulitits improving  No pedal edema, cyanosis, or clubbing.  NEUROLOGIC: Cranial nerves II through XII are intact. Muscle strength 5/5 in all extremities. Sensation intact. Gait not checked.  PSYCHIATRIC: The patient is alert and oriented x 3.  SKIN: No obvious rash, lesion, or ulcer.   DATA REVIEW:   CBC No results for input(s): WBC, HGB, HCT, PLT in the last 168 hours.  Chemistries  No results for input(s): NA, K, CL, CO2, GLUCOSE, BUN, CREATININE, CALCIUM, MG, AST, ALT, ALKPHOS, BILITOT in the last 168 hours.  Invalid input(s): GFRCGP  Cardiac Enzymes No results for input(s): TROPONINI in the last 168 hours.  Microbiology Results  Results for orders placed or performed during the hospital encounter of 10/11/15  Urine culture     Status: None   Collection Time: 10/11/15  8:46 PM  Result Value Ref Range Status   Specimen Description URINE, CLEAN CATCH  Final   Special Requests NONE  Final   Culture MULTIPLE SPECIES PRESENT, SUGGEST RECOLLECTION  Final   Report Status 10/13/2015 FINAL  Final    RADIOLOGY:  No results found.  EKG:   Orders placed or performed during the hospital encounter of 06/06/16  . EKG 12-Lead  . EKG 12-Lead  . EKG 12-Lead  . EKG 12-Lead      CODE STATUS:  Code Status History    Date Active Date  Inactive Code Status Order ID Comments User Context   06/06/2016 10:13 PM 06/11/2016 12:51 AM Full Code [id number]  Henreitta Leber, MD Inpatient      Pt left the hospital ama  Note: This dictation was prepared with Dragon dictation along with smaller phrase technology. Any transcriptional errors that result from this process are unintentional.   @M[facility] @  on 07/11/2016 at 5:41 PM  Between 7am to 6pm - Pager - [phone]  After 6pm go to [email or web address] - password [email or web address] [place] [facility]  Office  [phone]  CC: Primary care physician; Tula Nakayama, MD

## 2016-07-12 ENCOUNTER — Emergency Department: Payer: Medicare Other

## 2016-07-12 ENCOUNTER — Inpatient Hospital Stay
Admission: EM | Admit: 2016-07-12 | Discharge: 2016-07-18 | DRG: 602 | Disposition: A | Discharge status: Home Health | Payer: Medicare Other | Attending: Internal Medicine | Admitting: Internal Medicine

## 2016-07-12 ENCOUNTER — Encounter: Payer: Self-pay | Admitting: Emergency Medicine

## 2016-07-12 DIAGNOSIS — H05011 Cellulitis of right orbit: Secondary | ICD-10-CM | POA: Diagnosis not present

## 2016-07-12 DIAGNOSIS — L03213 Periorbital cellulitis: Secondary | ICD-10-CM | POA: Diagnosis present

## 2016-07-12 DIAGNOSIS — H5711 Ocular pain, right eye: Secondary | ICD-10-CM | POA: Diagnosis not present

## 2016-07-12 DIAGNOSIS — B9562 Methicillin resistant Staphylococcus aureus infection as the cause of diseases classified elsewhere: Secondary | ICD-10-CM | POA: Diagnosis present

## 2016-07-12 DIAGNOSIS — H578 Other specified disorders of eye and adnexa: Secondary | ICD-10-CM | POA: Diagnosis not present

## 2016-07-12 DIAGNOSIS — Z720 Tobacco use: Secondary | ICD-10-CM

## 2016-07-12 DIAGNOSIS — L539 Erythematous condition, unspecified: Secondary | ICD-10-CM

## 2016-07-12 DIAGNOSIS — E11621 Type 2 diabetes mellitus with foot ulcer: Secondary | ICD-10-CM | POA: Diagnosis present

## 2016-07-12 DIAGNOSIS — N2581 Secondary hyperparathyroidism of renal origin: Secondary | ICD-10-CM | POA: Diagnosis present

## 2016-07-12 DIAGNOSIS — E1122 Type 2 diabetes mellitus with diabetic chronic kidney disease: Secondary | ICD-10-CM | POA: Diagnosis not present

## 2016-07-12 DIAGNOSIS — F17210 Nicotine dependence, cigarettes, uncomplicated: Secondary | ICD-10-CM | POA: Diagnosis present

## 2016-07-12 DIAGNOSIS — R9431 Abnormal electrocardiogram [ECG] [EKG]: Secondary | ICD-10-CM | POA: Diagnosis not present

## 2016-07-12 DIAGNOSIS — Z794 Long term (current) use of insulin: Secondary | ICD-10-CM

## 2016-07-12 DIAGNOSIS — Z992 Dependence on renal dialysis: Secondary | ICD-10-CM

## 2016-07-12 DIAGNOSIS — L03211 Cellulitis of face: Secondary | ICD-10-CM | POA: Diagnosis present

## 2016-07-12 DIAGNOSIS — E7800 Pure hypercholesterolemia, unspecified: Secondary | ICD-10-CM | POA: Diagnosis present

## 2016-07-12 DIAGNOSIS — R0602 Shortness of breath: Secondary | ICD-10-CM | POA: Diagnosis not present

## 2016-07-12 DIAGNOSIS — H00033 Abscess of eyelid right eye, unspecified eyelid: Secondary | ICD-10-CM

## 2016-07-12 DIAGNOSIS — I5032 Chronic diastolic (congestive) heart failure: Secondary | ICD-10-CM | POA: Diagnosis present

## 2016-07-12 DIAGNOSIS — I132 Hypertensive heart and chronic kidney disease with heart failure and with stage 5 chronic kidney disease, or end stage renal disease: Secondary | ICD-10-CM | POA: Diagnosis not present

## 2016-07-12 DIAGNOSIS — Z79899 Other long term (current) drug therapy: Secondary | ICD-10-CM

## 2016-07-12 DIAGNOSIS — L0201 Cutaneous abscess of face: Principal | ICD-10-CM | POA: Diagnosis present

## 2016-07-12 DIAGNOSIS — E785 Hyperlipidemia, unspecified: Secondary | ICD-10-CM | POA: Diagnosis present

## 2016-07-12 DIAGNOSIS — L97529 Non-pressure chronic ulcer of other part of left foot with unspecified severity: Secondary | ICD-10-CM | POA: Diagnosis present

## 2016-07-12 DIAGNOSIS — R778 Other specified abnormalities of plasma proteins: Secondary | ICD-10-CM | POA: Diagnosis present

## 2016-07-12 DIAGNOSIS — J449 Chronic obstructive pulmonary disease, unspecified: Secondary | ICD-10-CM | POA: Diagnosis present

## 2016-07-12 DIAGNOSIS — E871 Hypo-osmolality and hyponatremia: Secondary | ICD-10-CM | POA: Diagnosis present

## 2016-07-12 DIAGNOSIS — Z8249 Family history of ischemic heart disease and other diseases of the circulatory system: Secondary | ICD-10-CM

## 2016-07-12 DIAGNOSIS — N186 End stage renal disease: Secondary | ICD-10-CM | POA: Diagnosis not present

## 2016-07-12 DIAGNOSIS — E1159 Type 2 diabetes mellitus with other circulatory complications: Secondary | ICD-10-CM

## 2016-07-12 DIAGNOSIS — H5789 Other specified disorders of eye and adnexa: Secondary | ICD-10-CM

## 2016-07-12 DIAGNOSIS — L851 Acquired keratosis [keratoderma] palmaris et plantaris: Secondary | ICD-10-CM | POA: Diagnosis present

## 2016-07-12 DIAGNOSIS — F17219 Nicotine dependence, cigarettes, with unspecified nicotine-induced disorders: Secondary | ICD-10-CM

## 2016-07-12 DIAGNOSIS — Z8614 Personal history of Methicillin resistant Staphylococcus aureus infection: Secondary | ICD-10-CM

## 2016-07-12 DIAGNOSIS — Z87891 Personal history of nicotine dependence: Secondary | ICD-10-CM

## 2016-07-12 DIAGNOSIS — E1142 Type 2 diabetes mellitus with diabetic polyneuropathy: Secondary | ICD-10-CM | POA: Diagnosis not present

## 2016-07-12 DIAGNOSIS — R040 Epistaxis: Secondary | ICD-10-CM | POA: Diagnosis present

## 2016-07-12 DIAGNOSIS — D631 Anemia in chronic kidney disease: Secondary | ICD-10-CM | POA: Diagnosis present

## 2016-07-12 DIAGNOSIS — R220 Localized swelling, mass and lump, head: Secondary | ICD-10-CM | POA: Diagnosis not present

## 2016-07-12 DIAGNOSIS — I10 Essential (primary) hypertension: Secondary | ICD-10-CM

## 2016-07-12 DIAGNOSIS — L538 Other specified erythematous conditions: Secondary | ICD-10-CM | POA: Diagnosis not present

## 2016-07-12 LAB — CBC
HEMATOCRIT: 34.2 % — AB (ref 35.0–47.0)
Hemoglobin: 11.7 g/dL — ABNORMAL LOW (ref 12.0–16.0)
MCH: 31.6 pg (ref 26.0–34.0)
MCHC: 34.1 g/dL (ref 32.0–36.0)
MCV: 92.7 fL (ref 80.0–100.0)
Platelets: 159 10*3/uL (ref 150–440)
RBC: 3.69 MIL/uL — ABNORMAL LOW (ref 3.80–5.20)
RDW: 15 % — AB (ref 11.5–14.5)
WBC: 8.1 10*3/uL (ref 3.6–11.0)

## 2016-07-12 LAB — BASIC METABOLIC PANEL
Anion gap: 12 (ref 5–15)
BUN: 45 mg/dL — AB (ref 6–20)
CHLORIDE: 99 mmol/L — AB (ref 101–111)
CO2: 20 mmol/L — ABNORMAL LOW (ref 22–32)
Calcium: 8.7 mg/dL — ABNORMAL LOW (ref 8.9–10.3)
Creatinine, Ser: 4.88 mg/dL — ABNORMAL HIGH (ref 0.44–1.00)
GFR calc Af Amer: 11 mL/min — ABNORMAL LOW (ref >60)
GFR calc non Af Amer: 9 mL/min — ABNORMAL LOW (ref >60)
GLUCOSE: 155 mg/dL — AB (ref 65–99)
POTASSIUM: 4.4 mmol/L (ref 3.5–5.1)
SODIUM: 131 mmol/L — AB (ref 135–145)

## 2016-07-12 LAB — TROPONIN I
Troponin I: 0.03 ng/mL (ref <0.03)
Troponin I: <0.03 ng/mL (ref <0.03)

## 2016-07-12 MED ORDER — TRAMADOL HCL 50 MG PO TABS: 50.0000 mg | ORAL_TABLET | Freq: Four times a day (QID) | ORAL | 0 refills | Status: DC | PRN

## 2016-07-12 MED ORDER — TRAMADOL HCL 50 MG PO TABS
50.0000 mg | ORAL_TABLET | Freq: Once | ORAL | Status: AC
  Administered 2016-07-12: 50 mg via ORAL

## 2016-07-12 MED ORDER — TRAMADOL HCL 50 MG PO TABS
ORAL_TABLET | ORAL | Status: AC
  Administered 2016-07-12: 50 mg via ORAL
  Filled 2016-07-12: qty 1

## 2016-07-12 MED ORDER — VANCOMYCIN HCL 10 G IV SOLR
1500.0000 mg | Freq: Once | INTRAVENOUS | Status: AC
  Administered 2016-07-12: 1500 mg via INTRAVENOUS
  Filled 2016-07-12: qty 1500

## 2016-07-12 MED ORDER — CLINDAMYCIN HCL 300 MG PO CAPS: 300.0000 mg | ORAL_CAPSULE | Freq: Three times a day (TID) | ORAL | 0 refills | Status: DC

## 2016-07-12 NOTE — ED Triage Notes (Signed)
Pt states she has been playing with a kitten that is her sons and has scratches to right lower arm. Pt states she noticed area under right eye Friday and started having orbital swelling to right eye. Pt c/o pain as well that is not relieved with her prescribed pain medications.  No respiratory distress. Unsure if she has cellulitis from the kitten or if she was bit by an insect.

## 2016-07-12 NOTE — ED Notes (Signed)
Pt taken to MRI by this nurse, antibiotic paused for procedure.  Per Pt's request, pt's daughter-in-law called and given update on pt's care.

## 2016-07-12 NOTE — Discharge Instructions (Signed)
Please seek medical attention for any high fevers, chest pain, shortness of breath, change in behavior, persistent vomiting, bloody stool or any other new or concerning symptoms.  

## 2016-07-12 NOTE — ED Provider Notes (Addendum)
[facility] Emergency Department Provider Note    ____________________________________________   I have reviewed the triage vital signs and the nursing notes.   HISTORY  Chief Complaint Facial Swelling and Chest Pain   History limited by: Not Limited   HPI [name] is a 50 y.o. female  who presents to the emergency department today because of concerns for right eye pain and swelling. Symptoms started this morning. The patient states that the swelling is gone worse throughout the day. He patient states she has had some blurry vision. She states pain is worse with movement of her eye. It is unclear what started this all does have a small lesion to her cheek. In addition the patient had some chest pain today. She states she has been having this on and off frequently. She states she was hospitalized for it in the past and told she had anxiety. Patient has not noticed any fevers.    Past Medical History:  Diagnosis Date  . Anemia   . Anxiety   . Arthritis   . CHF (congestive heart failure) ([place])   . Chronic kidney disease   . COPD (chronic obstructive pulmonary disease) ([place])   . Depression   . Diabetes mellitus without complication ([place])   . Emphysema of lung ([place])   . End stage renal disease ([place])   . Hepatitis   . Hypercholesterolemia   . Hypertension   . Tobacco dependence     Patient Active Problem List   Diagnosis Date Noted  . Cellulitis 06/06/2016  . Adjustment disorder with mixed disturbance of emotions and conduct 05/11/2016  . Dysthymia 05/11/2016    Past Surgical History:  Procedure Laterality Date  . CHOLECYSTECTOMY    . cyst removed  from rt hand    . PERIPHERAL VASCULAR CATHETERIZATION N/A 07/25/2015   Procedure: A/V Shuntogram/Fistulagram;  Surgeon: Algernon Huxley, MD;  Location: [place] [facility];  Service: Cardiovascular;  Laterality: N/A;  . PERIPHERAL VASCULAR CATHETERIZATION Left 07/25/2015   Procedure: A/V Shunt  Intervention;  Surgeon: Algernon Huxley, MD;  Location: [place] [facility];  Service: Cardiovascular;  Laterality: Left;  . PERIPHERAL VASCULAR CATHETERIZATION Left 10/07/2015   Procedure: A/V Shuntogram/Fistulagram;  Surgeon: Algernon Huxley, MD;  Location: [place] [facility];  Service: Cardiovascular;  Laterality: Left;  . PERIPHERAL VASCULAR CATHETERIZATION N/A 10/07/2015   Procedure: A/V Shunt Intervention;  Surgeon: Algernon Huxley, MD;  Location: [place] [facility];  Service: Cardiovascular;  Laterality: N/A;  . PERIPHERAL VASCULAR CATHETERIZATION  10/07/2015   Procedure: Dialysis/Perma Catheter Insertion;  Surgeon: Algernon Huxley, MD;  Location: [place] [facility];  Service: Cardiovascular;;  . PERIPHERAL VASCULAR CATHETERIZATION N/A 12/17/2015   Procedure: Dialysis/Perma Catheter Removal;  Surgeon: Katha Cabal, MD;  Location: [place] [facility];  Service: Cardiovascular;  Laterality: N/A;  . rt. tubal and ovary removed    . TONSILLECTOMY       Allergies Cinnamon; Cinoxate; Tylenol [acetaminophen]; Garlic; and Onion  Family History  Problem Relation Age of Onset  . Hypertension Mother   . Heart disease Mother   . Hypertension Father     Social History Social History  Substance Use Topics  . Smoking status: Current Every Day Smoker    Packs/day: 0.50    Years: 17.00    Types: Cigarettes  . Smokeless tobacco: Never Used  . Alcohol use No    Review of Systems  Constitutional: Negative for fever. Cardiovascular: Negative for chest  pain. Respiratory: Negative for shortness of breath. Gastrointestinal: Negative for abdominal pain, vomiting and diarrhea. Skin: Redness and erythema to right eye. Neurological: Negative for headaches, focal weakness or numbness.  10-point ROS otherwise negative.  ____________________________________________   PHYSICAL EXAM:  VITAL SIGNS: ED Triage Vitals [07/12/16 1812]  Enc Vitals Group     BP (!) 156/73     Pulse Rate 98      Resp 18     Temp 98.3 F (36.8 C)     Temp Source Oral     SpO2 99 %     Weight 190 lb (86.2 kg)     Height 5\' 5"  (1.651 m)     Head Circumference      Peak Flow      Pain Score 10   Constitutional: Alert and oriented. Well appearing and in no distress. Eyes: Conjunctivae are normal. PERRL. Normal extraocular movements. Erythema and swelling to periorbital skin. ENT   Head: Normocephalic and atraumatic.   Nose: No congestion/rhinnorhea.   Mouth/Throat: Mucous membranes are moist.   Neck: No stridor. Hematological/Lymphatic/Immunilogical: No cervical lymphadenopathy. Cardiovascular: Normal rate, regular rhythm.  No murmurs, rubs, or gallops. Respiratory: Normal respiratory effort without tachypnea nor retractions. Breath sounds are clear and equal bilaterally. No wheezes/rales/rhonchi. Gastrointestinal: Soft and nontender. No distention.  Genitourinary: Deferred Musculoskeletal: Normal range of motion in all extremities. No joint effusions.  No lower extremity tenderness nor edema. Neurologic:  Normal speech and language. No gross focal neurologic deficits are appreciated.  Skin:  Skin is warm, dry and intact. No rash noted. Psychiatric: Mood and affect are normal. Speech and behavior are normal. Patient exhibits appropriate insight and judgment.  ____________________________________________    LABS (pertinent positives/negatives)  Labs Reviewed  BASIC METABOLIC PANEL - Abnormal; Notable for the following:       Result Value   Sodium 131 (*)    Chloride 99 (*)    CO2 20 (*)    Glucose, Bld 155 (*)    BUN 45 (*)    Creatinine, Ser 4.88 (*)    Calcium 8.7 (*)    GFR calc non Af Amer 9 (*)    GFR calc Af Amer 11 (*)    All other components within normal limits  CBC - Abnormal; Notable for the following:    RBC 3.69 (*)    Hemoglobin 11.7 (*)    HCT 34.2 (*)    RDW 15.0 (*)    All other components within normal limits  TROPONIN I - Abnormal; Notable for the  following:    Troponin I 0.03 (*)    All other components within normal limits  TROPONIN I     ____________________________________________   EKG  I, Nance Pear, attending physician, personally viewed and interpreted this EKG  EKG Time: 1818 Rate: 92 Rhythm: normal sinus rhythm Axis: normal Intervals: qtc 450 QRS: narrow, LVH ST changes: no st elevation Impression: abnormal ekg   ____________________________________________    RADIOLOGY  CXR IMPRESSION: No active cardiopulmonary disease.  ____________________________________________   PROCEDURES  Procedures  ____________________________________________   INITIAL IMPRESSION / ASSESSMENT AND PLAN / ED COURSE  Pertinent labs & imaging results that were available during my care of the patient were reviewed by me and considered in my medical decision making (see chart for details).  Patient presented to the emergency department today because of concern for redness and swelling around her right eye pain. Patient does have a history of diabetes and chronic kidney disease. She does  have redness and swelling around that eye. She does state it is painful when she moves her eye. But have some concern for orbital cellulitis. Will order CT scan of the orbits. Additionally will check troponin given chest pain  Clinical Course   Second troponin negative. Unfortunately our CT scanner's are down. Will try to arrange patient to get CT scan either here or at another facility. Patient will be given dose of vancomycin.  1045PM Spoke with patient again. She would now like to go home. Will still give dose of antibiotics. I think it is reasonable at this point to try a trail of home antibiotics although did also discuss I could not rule out orbital cellulitis without CT scan. Patient understood. Did discuss return precuations. ____________________________________________   FINAL CLINICAL IMPRESSION(S) / ED  DIAGNOSES  Erythema Cellulitis  Note: This dictation was prepared with Dragon dictation. Any transcriptional errors that result from this process are unintentional    Nance Pear, MD 07/12/16 [place], MD 07/12/16 2246

## 2016-07-12 NOTE — ED Notes (Signed)
Attempted IV access unsuccessful x1. Able to obtain blood sample via butterfly

## 2016-07-12 NOTE — ED Notes (Signed)
Pt's daughter-in law, Dewitt Hoes [phone], called for update, verbal permission to share medical information obtained from pt.  Daughter-in-law request to be updated when decision to admit or discharge made.   Pt and daughter-in-law aware CT scanner at [facility] is down, Dr. Archie Balboa currently trying to arrange for CT scan to be completed at [facility]

## 2016-07-12 NOTE — Progress Notes (Addendum)
Pharmacy Antibiotic Note  [name] is a 50 y.o. female admitted on 07/12/2016 with cellulitis.  Pharmacy has been consulted for Zosyn and vancomycin dosing.  Plan: 1. Zosyn 3.375 gm IV Q12H EI 2. Vancomycin 1.5 gm IV x 1 now followed by vancomycin 750 mg IV Qdialysis MWF based on predialysis random level. Pharmacy will continue to follow and adjust.   Height: 5\' 5"  (165.1 cm) Weight: 190 lb (86.2 kg) IBW/kg (Calculated) : 57  Temp (24hrs), Avg:98.3 F (36.8 C), Min:98.3 F (36.8 C), Max:98.3 F (36.8 C)   Recent Labs Lab 07/12/16 1834  WBC 8.1  CREATININE 4.88*    Estimated Creatinine Clearance: 14.8 mL/min (by C-G formula based on SCr of 4.88 mg/dL).    Allergies  Allergen Reactions  . Cinnamon Anaphylaxis  . Cinoxate Anaphylaxis  . Tylenol [Acetaminophen] Anaphylaxis  . Garlic Hives  . Onion Hives and Swelling    Thank you for allowing pharmacy to be a part of this patient's care.  Laural Benes, Pharm.D., BCPS Clinical Pharmacist 07/12/2016 10:47 PM

## 2016-07-12 NOTE — ED Notes (Signed)
Pt to MRI

## 2016-07-13 ENCOUNTER — Encounter: Payer: Self-pay | Admitting: Internal Medicine

## 2016-07-13 DIAGNOSIS — D631 Anemia in chronic kidney disease: Secondary | ICD-10-CM | POA: Diagnosis not present

## 2016-07-13 DIAGNOSIS — B9562 Methicillin resistant Staphylococcus aureus infection as the cause of diseases classified elsewhere: Secondary | ICD-10-CM | POA: Diagnosis present

## 2016-07-13 DIAGNOSIS — L0201 Cutaneous abscess of face: Secondary | ICD-10-CM | POA: Diagnosis not present

## 2016-07-13 DIAGNOSIS — H5711 Ocular pain, right eye: Secondary | ICD-10-CM | POA: Diagnosis not present

## 2016-07-13 DIAGNOSIS — H05019 Cellulitis of unspecified orbit: Secondary | ICD-10-CM | POA: Diagnosis not present

## 2016-07-13 DIAGNOSIS — E11621 Type 2 diabetes mellitus with foot ulcer: Secondary | ICD-10-CM | POA: Diagnosis present

## 2016-07-13 DIAGNOSIS — E785 Hyperlipidemia, unspecified: Secondary | ICD-10-CM | POA: Diagnosis present

## 2016-07-13 DIAGNOSIS — I5032 Chronic diastolic (congestive) heart failure: Secondary | ICD-10-CM | POA: Diagnosis present

## 2016-07-13 DIAGNOSIS — R748 Abnormal levels of other serum enzymes: Secondary | ICD-10-CM | POA: Diagnosis not present

## 2016-07-13 DIAGNOSIS — L03211 Cellulitis of face: Secondary | ICD-10-CM | POA: Diagnosis present

## 2016-07-13 DIAGNOSIS — R220 Localized swelling, mass and lump, head: Secondary | ICD-10-CM | POA: Diagnosis not present

## 2016-07-13 DIAGNOSIS — N2581 Secondary hyperparathyroidism of renal origin: Secondary | ICD-10-CM | POA: Diagnosis not present

## 2016-07-13 DIAGNOSIS — L03213 Periorbital cellulitis: Secondary | ICD-10-CM | POA: Diagnosis not present

## 2016-07-13 DIAGNOSIS — R778 Other specified abnormalities of plasma proteins: Secondary | ICD-10-CM | POA: Diagnosis present

## 2016-07-13 DIAGNOSIS — E1142 Type 2 diabetes mellitus with diabetic polyneuropathy: Secondary | ICD-10-CM | POA: Diagnosis present

## 2016-07-13 DIAGNOSIS — L851 Acquired keratosis [keratoderma] palmaris et plantaris: Secondary | ICD-10-CM | POA: Diagnosis present

## 2016-07-13 DIAGNOSIS — Z716 Tobacco abuse counseling: Secondary | ICD-10-CM | POA: Diagnosis not present

## 2016-07-13 DIAGNOSIS — I10 Essential (primary) hypertension: Secondary | ICD-10-CM | POA: Diagnosis not present

## 2016-07-13 DIAGNOSIS — H05011 Cellulitis of right orbit: Secondary | ICD-10-CM | POA: Diagnosis not present

## 2016-07-13 DIAGNOSIS — J449 Chronic obstructive pulmonary disease, unspecified: Secondary | ICD-10-CM | POA: Diagnosis not present

## 2016-07-13 DIAGNOSIS — E1122 Type 2 diabetes mellitus with diabetic chronic kidney disease: Secondary | ICD-10-CM | POA: Diagnosis present

## 2016-07-13 DIAGNOSIS — Z8249 Family history of ischemic heart disease and other diseases of the circulatory system: Secondary | ICD-10-CM | POA: Diagnosis not present

## 2016-07-13 DIAGNOSIS — I132 Hypertensive heart and chronic kidney disease with heart failure and with stage 5 chronic kidney disease, or end stage renal disease: Secondary | ICD-10-CM | POA: Diagnosis present

## 2016-07-13 DIAGNOSIS — R079 Chest pain, unspecified: Secondary | ICD-10-CM | POA: Diagnosis not present

## 2016-07-13 DIAGNOSIS — F17210 Nicotine dependence, cigarettes, uncomplicated: Secondary | ICD-10-CM | POA: Diagnosis present

## 2016-07-13 DIAGNOSIS — L97529 Non-pressure chronic ulcer of other part of left foot with unspecified severity: Secondary | ICD-10-CM | POA: Diagnosis present

## 2016-07-13 DIAGNOSIS — Z992 Dependence on renal dialysis: Secondary | ICD-10-CM | POA: Diagnosis not present

## 2016-07-13 DIAGNOSIS — N186 End stage renal disease: Secondary | ICD-10-CM | POA: Diagnosis not present

## 2016-07-13 DIAGNOSIS — R040 Epistaxis: Secondary | ICD-10-CM | POA: Diagnosis present

## 2016-07-13 DIAGNOSIS — E871 Hypo-osmolality and hyponatremia: Secondary | ICD-10-CM | POA: Diagnosis not present

## 2016-07-13 DIAGNOSIS — E7800 Pure hypercholesterolemia, unspecified: Secondary | ICD-10-CM | POA: Diagnosis present

## 2016-07-13 LAB — CBC
HEMATOCRIT: 29.8 % — AB (ref 35.0–47.0)
HEMOGLOBIN: 10.2 g/dL — AB (ref 12.0–16.0)
MCH: 31.7 pg (ref 26.0–34.0)
MCHC: 34.2 g/dL (ref 32.0–36.0)
MCV: 92.7 fL (ref 80.0–100.0)
Platelets: 121 10*3/uL — ABNORMAL LOW (ref 150–440)
RBC: 3.21 MIL/uL — ABNORMAL LOW (ref 3.80–5.20)
RDW: 15.2 % — ABNORMAL HIGH (ref 11.5–14.5)
WBC: 6.3 10*3/uL (ref 3.6–11.0)

## 2016-07-13 LAB — URINE DRUG SCREEN, QUALITATIVE (ARMC ONLY)
Amphetamines, Ur Screen: NOT DETECTED
BARBITURATES, UR SCREEN: NOT DETECTED
Benzodiazepine, Ur Scrn: NOT DETECTED
COCAINE METABOLITE, UR ~~LOC~~: NOT DETECTED
Cannabinoid 50 Ng, Ur ~~LOC~~: NOT DETECTED
MDMA (Ecstasy)Ur Screen: NOT DETECTED
METHADONE SCREEN, URINE: NOT DETECTED
OPIATE, UR SCREEN: POSITIVE — AB
Phencyclidine (PCP) Ur S: NOT DETECTED
Tricyclic, Ur Screen: NOT DETECTED

## 2016-07-13 LAB — GLUCOSE, CAPILLARY
GLUCOSE-CAPILLARY: 101 mg/dL — AB (ref 65–99)
Glucose-Capillary: 198 mg/dL — ABNORMAL HIGH (ref 65–99)
Glucose-Capillary: 151 mg/dL — ABNORMAL HIGH (ref 65–99)

## 2016-07-13 LAB — BASIC METABOLIC PANEL
ANION GAP: 8 (ref 5–15)
BUN: 49 mg/dL — ABNORMAL HIGH (ref 6–20)
CALCIUM: 8.2 mg/dL — AB (ref 8.9–10.3)
CO2: 22 mmol/L (ref 22–32)
Chloride: 103 mmol/L (ref 101–111)
Creatinine, Ser: 5.4 mg/dL — ABNORMAL HIGH (ref 0.44–1.00)
GFR, EST AFRICAN AMERICAN: 10 mL/min — AB (ref >60)
GFR, EST NON AFRICAN AMERICAN: 8 mL/min — AB (ref >60)
Glucose, Bld: 212 mg/dL — ABNORMAL HIGH (ref 65–99)
POTASSIUM: 4.4 mmol/L (ref 3.5–5.1)
SODIUM: 133 mmol/L — AB (ref 135–145)

## 2016-07-13 LAB — HEMOGLOBIN A1C: HEMOGLOBIN A1C: 6.7 % — AB (ref 4.0–6.0)

## 2016-07-13 LAB — MRSA PCR SCREENING: MRSA by PCR: NEGATIVE

## 2016-07-13 LAB — PHOSPHORUS: PHOSPHORUS: 4.3 mg/dL (ref 2.5–4.6)

## 2016-07-13 MED ORDER — NITROGLYCERIN 0.4 MG SL SUBL
0.4000 mg | SUBLINGUAL_TABLET | SUBLINGUAL | Status: DC | PRN
  Administered 2016-07-13 – 2016-07-18 (×6): 0.4 mg via SUBLINGUAL
  Filled 2016-07-13 (×6): qty 1

## 2016-07-13 MED ORDER — HEPARIN SODIUM (PORCINE) 5000 UNIT/ML IJ SOLN
5000.0000 [IU] | Freq: Three times a day (TID) | INTRAMUSCULAR | Status: DC
  Filled 2016-07-13 (×4): qty 1

## 2016-07-13 MED ORDER — LIDOCAINE HCL (PF) 1 % IJ SOLN
5.0000 mL | INTRAMUSCULAR | Status: DC | PRN
  Filled 2016-07-13: qty 5

## 2016-07-13 MED ORDER — PENTAFLUOROPROP-TETRAFLUOROETH EX AERO
1.0000 "application " | INHALATION_SPRAY | CUTANEOUS | Status: DC | PRN
  Filled 2016-07-13: qty 30

## 2016-07-13 MED ORDER — INSULIN ASPART 100 UNIT/ML ~~LOC~~ SOLN
4.0000 [IU] | Freq: Three times a day (TID) | SUBCUTANEOUS | Status: DC
  Administered 2016-07-14 – 2016-07-15 (×4): 4 [IU] via SUBCUTANEOUS
  Administered 2016-07-16: 3 [IU] via SUBCUTANEOUS
  Administered 2016-07-17 – 2016-07-18 (×5): 4 [IU] via SUBCUTANEOUS
  Filled 2016-07-13 (×2): qty 4
  Filled 2016-07-13: qty 3
  Filled 2016-07-13 (×7): qty 4

## 2016-07-13 MED ORDER — ALPRAZOLAM 0.25 MG PO TABS
0.2500 mg | ORAL_TABLET | Freq: Four times a day (QID) | ORAL | Status: DC | PRN
  Administered 2016-07-13 – 2016-07-18 (×7): 0.25 mg via ORAL
  Filled 2016-07-13 (×7): qty 1

## 2016-07-13 MED ORDER — ONDANSETRON HCL 4 MG/2ML IJ SOLN
4.0000 mg | Freq: Once | INTRAMUSCULAR | Status: AC
  Administered 2016-07-13: 4 mg via INTRAVENOUS
  Filled 2016-07-13: qty 2

## 2016-07-13 MED ORDER — NICOTINE POLACRILEX 2 MG MT GUM: 2.0000 mg | CHEWING_GUM | OROMUCOSAL | Status: DC | PRN

## 2016-07-13 MED ORDER — VANCOMYCIN HCL IN DEXTROSE 750-5 MG/150ML-% IV SOLN
750.0000 mg | INTRAVENOUS | Status: DC
  Administered 2016-07-13: 750 mg via INTRAVENOUS
  Filled 2016-07-13 (×2): qty 150

## 2016-07-13 MED ORDER — HEPARIN SODIUM (PORCINE) 1000 UNIT/ML DIALYSIS
1000.0000 [IU] | INTRAMUSCULAR | Status: DC | PRN
  Filled 2016-07-13: qty 1

## 2016-07-13 MED ORDER — PIPERACILLIN-TAZOBACTAM 3.375 G IVPB
3.3750 g | Freq: Two times a day (BID) | INTRAVENOUS | Status: DC
  Administered 2016-07-13 – 2016-07-15 (×5): 3.375 g via INTRAVENOUS
  Filled 2016-07-13 (×6): qty 50

## 2016-07-13 MED ORDER — OXYCODONE HCL 5 MG PO TABS
10.0000 mg | ORAL_TABLET | Freq: Four times a day (QID) | ORAL | Status: DC | PRN
  Administered 2016-07-13 – 2016-07-18 (×14): 10 mg via ORAL
  Filled 2016-07-13 (×18): qty 2

## 2016-07-13 MED ORDER — INSULIN GLARGINE 100 UNIT/ML ~~LOC~~ SOLN
15.0000 [IU] | Freq: Every day | SUBCUTANEOUS | Status: DC
Start: 2016-07-13 — End: 2016-07-16
  Administered 2016-07-13 – 2016-07-15 (×4): 15 [IU] via SUBCUTANEOUS
  Filled 2016-07-13 (×5): qty 0.15

## 2016-07-13 MED ORDER — ONDANSETRON HCL 4 MG/2ML IJ SOLN: 4.0000 mg | Freq: Four times a day (QID) | INTRAMUSCULAR | Status: DC | PRN

## 2016-07-13 MED ORDER — ASPIRIN EC 325 MG PO TBEC
325.0000 mg | DELAYED_RELEASE_TABLET | Freq: Every day | ORAL | Status: DC
  Administered 2016-07-13 – 2016-07-18 (×6): 325 mg via ORAL
  Filled 2016-07-13 (×6): qty 1

## 2016-07-13 MED ORDER — HYDRALAZINE HCL 50 MG PO TABS
100.0000 mg | ORAL_TABLET | Freq: Two times a day (BID) | ORAL | Status: DC
  Administered 2016-07-13 – 2016-07-18 (×11): 100 mg via ORAL
  Filled 2016-07-13 (×11): qty 2

## 2016-07-13 MED ORDER — MORPHINE SULFATE (PF) 2 MG/ML IV SOLN
2.0000 mg | INTRAVENOUS | Status: DC | PRN
  Administered 2016-07-13 – 2016-07-15 (×5): 2 mg via INTRAVENOUS
  Filled 2016-07-13 (×5): qty 1

## 2016-07-13 MED ORDER — INSULIN ASPART 100 UNIT/ML ~~LOC~~ SOLN
0.0000 [IU] | Freq: Every day | SUBCUTANEOUS | Status: DC
  Administered 2016-07-17: 4 [IU] via SUBCUTANEOUS
  Filled 2016-07-13: qty 4

## 2016-07-13 MED ORDER — VENLAFAXINE HCL ER 75 MG PO CP24
150.0000 mg | ORAL_CAPSULE | Freq: Every day | ORAL | Status: DC
  Administered 2016-07-13 – 2016-07-18 (×6): 150 mg via ORAL
  Filled 2016-07-13 (×6): qty 2

## 2016-07-13 MED ORDER — SODIUM CHLORIDE 0.9% FLUSH: 3.0000 mL | INTRAVENOUS | Status: DC | PRN

## 2016-07-13 MED ORDER — DEXTROSE 5 % IV SOLN: 1.0000 g | Freq: Once | INTRAVENOUS | Status: DC

## 2016-07-13 MED ORDER — PANTOPRAZOLE SODIUM 40 MG PO TBEC
40.0000 mg | DELAYED_RELEASE_TABLET | Freq: Every day | ORAL | Status: DC
  Administered 2016-07-13 – 2016-07-18 (×6): 40 mg via ORAL
  Filled 2016-07-13 (×6): qty 1

## 2016-07-13 MED ORDER — NITROGLYCERIN 2 % TD OINT
1.0000 [in_us] | TOPICAL_OINTMENT | Freq: Four times a day (QID) | TRANSDERMAL | Status: DC
  Administered 2016-07-13 – 2016-07-18 (×19): 1 [in_us] via TOPICAL
  Filled 2016-07-13 (×19): qty 1

## 2016-07-13 MED ORDER — PIPERACILLIN-TAZOBACTAM 3.375 G IVPB 30 MIN
3.3750 g | Freq: Once | INTRAVENOUS | Status: AC
  Administered 2016-07-13: 3.375 g via INTRAVENOUS
  Filled 2016-07-13: qty 50

## 2016-07-13 MED ORDER — SODIUM CHLORIDE 0.9 % IV SOLN: 100.0000 mL | INTRAVENOUS | Status: DC | PRN

## 2016-07-13 MED ORDER — SODIUM CHLORIDE 0.9 % IV SOLN: 250.0000 mL | INTRAVENOUS | Status: DC | PRN

## 2016-07-13 MED ORDER — ALTEPLASE 2 MG IJ SOLR
2.0000 mg | Freq: Once | INTRAMUSCULAR | Status: DC | PRN
  Filled 2016-07-13: qty 2

## 2016-07-13 MED ORDER — MORPHINE SULFATE (PF) 4 MG/ML IV SOLN
4.0000 mg | Freq: Once | INTRAVENOUS | Status: AC
  Administered 2016-07-13: 4 mg via INTRAVENOUS
  Filled 2016-07-13: qty 1

## 2016-07-13 MED ORDER — SEVELAMER CARBONATE 800 MG PO TABS
1600.0000 mg | ORAL_TABLET | Freq: Three times a day (TID) | ORAL | Status: DC
  Administered 2016-07-13 – 2016-07-18 (×16): 1600 mg via ORAL
  Filled 2016-07-13 (×16): qty 2

## 2016-07-13 MED ORDER — RENA-VITE PO TABS
1.0000 | ORAL_TABLET | Freq: Every day | ORAL | Status: DC
  Administered 2016-07-13 – 2016-07-17 (×5): 1 via ORAL
  Filled 2016-07-13 (×5): qty 1

## 2016-07-13 MED ORDER — INSULIN ASPART 100 UNIT/ML ~~LOC~~ SOLN
0.0000 [IU] | Freq: Three times a day (TID) | SUBCUTANEOUS | Status: DC
  Administered 2016-07-14: 3 [IU] via SUBCUTANEOUS
  Administered 2016-07-15 – 2016-07-16 (×3): 1 [IU] via SUBCUTANEOUS
  Administered 2016-07-17: 5 [IU] via SUBCUTANEOUS
  Administered 2016-07-17 – 2016-07-18 (×2): 2 [IU] via SUBCUTANEOUS
  Filled 2016-07-13: qty 1
  Filled 2016-07-13: qty 5
  Filled 2016-07-13 (×2): qty 1
  Filled 2016-07-13: qty 2
  Filled 2016-07-13: qty 4
  Filled 2016-07-13: qty 1

## 2016-07-13 MED ORDER — VANCOMYCIN HCL IN DEXTROSE 1-5 GM/200ML-% IV SOLN: 1000.0000 mg | Freq: Once | INTRAVENOUS | Status: DC

## 2016-07-13 MED ORDER — LIDOCAINE-PRILOCAINE 2.5-2.5 % EX CREA
1.0000 "application " | TOPICAL_CREAM | CUTANEOUS | Status: DC | PRN
  Filled 2016-07-13: qty 5

## 2016-07-13 MED ORDER — ONDANSETRON HCL 4 MG PO TABS: 4.0000 mg | ORAL_TABLET | Freq: Four times a day (QID) | ORAL | Status: DC | PRN

## 2016-07-13 MED ORDER — DILTIAZEM HCL ER 60 MG PO CP12
120.0000 mg | ORAL_CAPSULE | Freq: Two times a day (BID) | ORAL | Status: DC
  Administered 2016-07-13 – 2016-07-15 (×6): 120 mg via ORAL
  Filled 2016-07-13 (×8): qty 2

## 2016-07-13 MED ORDER — SENNOSIDES-DOCUSATE SODIUM 8.6-50 MG PO TABS: 1.0000 | ORAL_TABLET | Freq: Every evening | ORAL | Status: DC | PRN

## 2016-07-13 MED ORDER — SODIUM CHLORIDE 0.9% FLUSH
3.0000 mL | Freq: Two times a day (BID) | INTRAVENOUS | Status: DC
  Administered 2016-07-13 – 2016-07-18 (×12): 3 mL via INTRAVENOUS

## 2016-07-13 NOTE — Consult Note (Signed)
ENT Came to see pt but she is in dialysis.  Reviewed MRI scan which doesn't show any significant sinus disease as the source of the orbital cellulitis.  Suspect localized infection, possibly staph, that seems to be responding to Vancomycin.  Will revisit tomorrow.  Margaretha Sheffield 07/13/2016 7:15 pm.

## 2016-07-13 NOTE — Consult Note (Signed)
[facility]  CARDIOLOGY CONSULT NOTE  Patient ID: [name] MRN: [id number] DOB/AGE: [birth year] 50 y.o.  Admit date: 07/12/2016 Referring Physician Dr. Ether Griffins Primary Physician   Primary Cardiologist   Reason for Consultation chest pain  HPI: Patient is a 50 year old female with history of end-stage renal disease on hemodialysis, history of apparent diastolic heart failure with echocardiogram done in April of this year revealing an ejection fraction is 6065% with mild to moderate MR. Patient presented to the hospital with right facial pain is noted to have right facial cellulitis. She was placed on broad-spectrum antibiotics with vancomycin and Zosyn. Her symptoms have improved although she complains of chronic chest discomfort. She has ruled out for myocardial infarction with normal serum troponin. Electrocardiogram reveals sinus rhythm with no ischemia. Pain is somewhat worsened with deep palpation.  Review of Systems  Constitutional: Positive for fever and malaise/fatigue.  HENT: Negative.   Eyes: Positive for pain, discharge and redness.  Respiratory: Negative.   Cardiovascular: Positive for chest pain.  Gastrointestinal: Negative.   Genitourinary: Negative.   Musculoskeletal: Negative.   Skin: Positive for rash.  Neurological: Negative.   Endo/Heme/Allergies: Negative.   Psychiatric/Behavioral: Negative.     Past Medical History:  Diagnosis Date  . Anemia   . Anxiety   . Arthritis   . CHF (congestive heart failure) ([place])   . Chronic kidney disease   . COPD (chronic obstructive pulmonary disease) ([place])   . Depression   . Diabetes mellitus without complication ([place])   . Emphysema of lung ([place])   . End stage renal disease ([place])   . Hepatitis   . Hypercholesterolemia   . Hypertension   . Tobacco dependence     Family History  Problem Relation Age of Onset  . Hypertension Mother   . Heart disease Mother   . Hypertension  Father     Social History   Social History  . Marital status: Legally Separated    Spouse name: N/A  . Number of children: N/A  . Years of education: N/A   Occupational History  . disabled    Social History Main Topics  . Smoking status: Current Every Day Smoker    Packs/day: 0.50    Years: 17.00    Types: Cigarettes  . Smokeless tobacco: Never Used  . Alcohol use No  . Drug use: No  . Sexual activity: Not on file   Other Topics Concern  . Not on file   Social History Narrative  . No narrative on file    Past Surgical History:  Procedure Laterality Date  . CHOLECYSTECTOMY    . cyst removed  from rt hand    . PERIPHERAL VASCULAR CATHETERIZATION N/A 07/25/2015   Procedure: A/V Shuntogram/Fistulagram;  Surgeon: Algernon Huxley, MD;  Location: [facility];  Service: Cardiovascular;  Laterality: N/A;  . PERIPHERAL VASCULAR CATHETERIZATION Left 07/25/2015   Procedure: A/V Shunt Intervention;  Surgeon: Algernon Huxley, MD;  Location: [place] [facility];  Service: Cardiovascular;  Laterality: Left;  . PERIPHERAL VASCULAR CATHETERIZATION Left 10/07/2015   Procedure: A/V Shuntogram/Fistulagram;  Surgeon: Algernon Huxley, MD;  Location: [facility];  Service: Cardiovascular;  Laterality: Left;  . PERIPHERAL VASCULAR CATHETERIZATION N/A 10/07/2015   Procedure: A/V Shunt Intervention;  Surgeon: Algernon Huxley, MD;  Location: [facility];  Service: Cardiovascular;  Laterality: N/A;  . PERIPHERAL VASCULAR CATHETERIZATION  10/07/2015   Procedure: Dialysis/Perma Catheter Insertion;  Surgeon:  Algernon Huxley, MD;  Location: [facility];  Service: Cardiovascular;;  . PERIPHERAL VASCULAR CATHETERIZATION N/A 12/17/2015   Procedure: Dialysis/Perma Catheter Removal;  Surgeon: Katha Cabal, MD;  Location: [facility];  Service: Cardiovascular;  Laterality: N/A;  . rt. tubal and ovary removed    . TONSILLECTOMY       Prescriptions Prior to Admission  Medication  Sig Dispense Refill Last Dose  . b complex-vitamin c-folic acid (NEPHRO-VITE) 0.8 MG TABS tablet Take 1 tablet by mouth daily.   Past Week at Unknown time  . clobetasol ointment (TEMOVATE) [id number] % Apply 1 application topically daily.   Past Week at Unknown time  . diltiazem (CARDIZEM SR) 120 MG 12 hr capsule Take 120 mg by mouth 2 (two) times daily.   Past Week at Unknown time  . hydrALAZINE (APRESOLINE) 100 MG tablet Take 100 mg by mouth 2 (two) times daily.   Past Week at Unknown time  . insulin glargine (LANTUS) 100 UNIT/ML injection Inject 15 Units into the skin at bedtime.    Past Week at Unknown time  . insulin regular (NOVOLIN R,HUMULIN R) 100 units/mL injection Inject 5-10 Units into the skin 3 (three) times daily before meals. Pt uses per sliding scale.   Past Week at Unknown time  . omeprazole (PRILOSEC) 40 MG capsule Take 40 mg by mouth daily.   Past Week at Unknown time  . Oxycodone HCl 10 MG TABS Take 10 mg by mouth 4 (four) times daily as needed (for pain).   Past Week at Unknown time  . sevelamer carbonate (RENVELA) 800 MG tablet Take 1,600 mg by mouth 3 (three) times daily with meals.   Past Week at Unknown time  . venlafaxine XR (EFFEXOR-XR) 75 MG 24 hr capsule Take 150 mg by mouth daily.   Past Week at Unknown time    Physical Exam: Blood pressure (!) 148/67, pulse 84, temperature 98.1 F (36.7 C), temperature source Oral, resp. rate 16, height 5\' 5"  (1.651 m), weight 86.2 kg (190 lb), SpO2 99 %.   Wt Readings from Last 1 Encounters:  07/12/16 86.2 kg (190 lb)     General appearance: alert and cooperative Eyes: positive findings: eyelids/periorbital: Erythema and swelling around her right eye Resp: clear to auscultation bilaterally Chest wall: right sided chest wall tenderness, left sided chest wall tenderness Cardio: regular rate and rhythm GI: soft, non-tender; bowel sounds normal; no masses,  no organomegaly Extremities: extremities normal, atraumatic, no cyanosis or  edema Neurologic: Grossly normal  Labs:   Lab Results  Component Value Date   WBC 6.3 07/13/2016   HGB 10.2 (L) 07/13/2016   HCT 29.8 (L) 07/13/2016   MCV 92.7 07/13/2016   PLT 121 (L) 07/13/2016    Recent Labs Lab 07/13/16 0526  NA 133*  K 4.4  CL 103  CO2 22  BUN 49*  CREATININE 5.40*  CALCIUM 8.2*  GLUCOSE 212*   Lab Results  Component Value Date   CKTOTAL 425 (H) 10/19/2014   CKMB 0.6 04/14/2013   TROPONINI <0.03 07/12/2016      Radiology: No acute cardiopulmonary disease on chest x-ray EKG: EKG shows sinus rhythm with no arrhythmia or ischemia  ASSESSMENT AND PLAN: 50 year old female with history of diastolic heart failure with preserved LV function EF of 60% who was admitted with periorbital cellulitis. Appear to be in heart failure at present. Chest pain is fairly atypical. She does not appear to be ischemic at present she has  ruled out for myocardial infarction. Electrocardiogram is normal. Would continue with aggressive treatment of her periorbital cellulitis with IV antibiotics as you're doing and following for further symptoms. Signed: Teodoro Spray MD, [facility] 07/13/2016, 4:43 PM

## 2016-07-13 NOTE — Progress Notes (Addendum)
[facility] NAME: [name]    MR#:  [id number]  DATE OF BIRTH:  1966/05/23  SUBJECTIVE:  CHIEF COMPLAINT:   Chief Complaint  Patient presents with  . Facial Swelling  . Chest Pain  The patient is 50 year old female with past history significant for history of congestive heart failure, COPD, COPD, diabetes mellitus, end-stage renal disease, on dialysis, hyperlipidemia, who presents to the hospital with complaints of chest pains right facial swelling and redness. On arrival to the hospital patient was noted to have right facial cellulitis, extending to both septal area. Patient was initiated on broad-spectrum antibiotic therapy with vancomycin and Zosyn, her condition improved, swelling and redness is subsiding, as well as pain. Patient is still complaining of some discomfort in the chest, apparently she has she has been having pain in the chest for while, it is chronic.   Review of Systems  Constitutional: Negative for chills, fever and weight loss.  HENT: Negative for congestion.   Eyes: Positive for discharge. Negative for blurred vision and double vision.  Respiratory: Negative for cough, sputum production, shortness of breath and wheezing.   Cardiovascular: Positive for chest pain. Negative for palpitations, orthopnea, leg swelling and PND.  Gastrointestinal: Negative for abdominal pain, blood in stool, constipation, diarrhea, nausea and vomiting.  Genitourinary: Negative for dysuria, frequency, hematuria and urgency.  Musculoskeletal: Negative for falls.  Neurological: Negative for dizziness, tremors, focal weakness and headaches.  Endo/Heme/Allergies: Does not bruise/bleed easily.  Psychiatric/Behavioral: Negative for depression. The patient does not have insomnia.     VITAL SIGNS: Blood pressure (!) 147/58, pulse 84, temperature 98.1 F (36.7 C), temperature source Oral, resp. rate 16, height 5\' 5"  (1.651 m), weight  86.2 kg (190 lb), SpO2 99 %.  PHYSICAL EXAMINATION:   GENERAL:  50 y.o.-year-old patient lying in the bed in mild distress due to discomfort and swelling of right face, periorbital area.  EYES: Pupils equal, round, reactive to light and accommodation. No scleral icterus. Extraocular muscles intact. No ocular discharge significant swelling of upper and lower right lid, periorbital area. Erythema, increased warmth and significant tenderness was noted on palpation, no fluctuations. Patient has small ulceration in the right upper cheek with some surrounding edema, painful to palpation. No purulence was expressed HEENT: Head atraumatic, normocephalic. Oropharynx and nasopharynx clear.  NECK:  Supple, no jugular venous distention. No thyroid enlargement, no tenderness.  LUNGS: Diminished breath sounds bilaterally, no wheezing, rales,rhonchi or crepitation. No use of accessory muscles of respiration.  CARDIOVASCULAR: S1, S2 normal. No murmurs, rubs, or gallops.  ABDOMEN: Soft, nontender, nondistended. Bowel sounds present. No organomegaly or mass.  EXTREMITIES: No pedal edema, cyanosis, or clubbing. Left lower extremity soul callus with some ulceration was noted in the forefoot, no discharge. No purulence or pain on palpation NEUROLOGIC: Cranial nerves II through XII are intact. Muscle strength 5/5 in all extremities. Sensation intact. Gait not checked.  PSYCHIATRIC: The patient is alert and oriented x 3.  SKIN: No obvious rash, lesion, or ulcer.   ORDERS/RESULTS REVIEWED:   CBC  Recent Labs Lab 07/12/16 1834 07/13/16 0526  WBC 8.1 6.3  HGB 11.7* 10.2*  HCT 34.2* 29.8*  PLT 159 121*  MCV 92.7 92.7  MCH 31.6 31.7  MCHC 34.1 34.2  RDW 15.0* 15.2*   ------------------------------------------------------------------------------------------------------------------  Chemistries   Recent Labs Lab 07/12/16 1834 07/13/16 0526  NA 131* 133*  K 4.4 4.4  CL 99* 103  CO2 20* 22  GLUCOSE 155*  212*  BUN 45* 49*  CREATININE 4.88* 5.40*  CALCIUM 8.7* 8.2*   ------------------------------------------------------------------------------------------------------------------ estimated creatinine clearance is 13.4 mL/min (by C-G formula based on SCr of 5.4 mg/dL). ------------------------------------------------------------------------------------------------------------------ No results for input(s): TSH, T4TOTAL, T3FREE, THYROIDAB in the last 72 hours.  Invalid input(s): FREET3  Cardiac Enzymes  Recent Labs Lab 07/12/16 1834 07/12/16 2131  TROPONINI 0.03* <0.03   ------------------------------------------------------------------------------------------------------------------ Invalid input(s): POCBNP ---------------------------------------------------------------------------------------------------------------  RADIOLOGY: Dg Chest 2 View  Result Date: 07/12/2016 CLINICAL DATA:  Patient with shortness of breath. EXAM: CHEST  2 VIEW COMPARISON:  Chest radiograph 04/06/2016; chest CT 06/10/2016 FINDINGS: Normal cardiac and mediastinal contours. No consolidative pulmonary opacities. No pleural effusion or pneumothorax. Thoracic spine degenerative changes. IMPRESSION: No active cardiopulmonary disease. Electronically Signed   By: Lovey Newcomer M.D.   On: 07/12/2016 19:01  Mr Farley Ly Cm  Result Date: 07/13/2016 CLINICAL DATA:  Initial evaluation for acute right eye pain and swelling. Concern for cellulitis. EXAM: MRI OF THE ORBITS WITHOUT CONTRAST TECHNIQUE: Multiplanar, multisequence MR imaging of the orbits was performed. No intravenous contrast was administered. COMPARISON:  None. FINDINGS: Study mildly limited due to lack of IV contrast and motion artifact. Soft tissue swelling with edema present within the right preseptal soft tissues extending into the right face/cheek. Edema primarily involves the right lower eyelid. Findings suspicious for possible acute preseptal cellulitis.  There is mild edema surrounding the right inferior rectus muscle, with involvement of both the intraconal and extraconal fat, suspicious for possible postseptal extension (series 5, image 18). Possible mild edema within the inferior rectus muscle itself. Remainder of the intraconal extraconal fat well-maintained without significant inflammatory changes. Right optic nerve within normal limits. Remainder of the right extraocular muscles within normal limits. Right superior orbital vein of normal size and symmetric with the left. Visualized portions of the cavernous sinus within normal limits. Right globe itself intact and normal in appearance. Lacrimal gland within normal limits. No loculated collection or findings to suggest abscess on this noncontrast examination. No acute abnormality identified about the left globe and orbit. Intraconal and extraconal fat well-preserved. Left-sided extraocular muscles, optic nerve, and superior orbital vein within normal limits. Lacrimal gland normal. No significant soft tissue swelling within the left preseptal soft tissues. Visualized portions of the brain demonstrate no acute abnormality. Major intracranial vascular flow voids are maintained. Visualized parotid glands within normal limits. Visualized paranasal sinuses are largely clear. IMPRESSION: Swelling with edema involving the right preseptal soft tissues and right face, nonspecific, but may reflect acute cellulitis in the correct clinical setting. There is abnormal edema extending posteriorly into the right inferior orbit surrounding the right inferior rectus muscle, suspicious for postseptal extension. No abscess or drainable fluid collection identified on this noncontrast examination. No other complication at this time. Electronically Signed   By: Jeannine Boga M.D.   On: 07/13/2016 00:37   EKG:  Orders placed or performed during the hospital encounter of 07/12/16  . ED EKG within 10 minutes  . ED EKG within  10 minutes  . EKG 12-Lead  . EKG 12-Lead    ASSESSMENT AND PLAN:  Principal Problem:   Orbital cellulitis on right #1. Periorbital and possibly post-septal cellulitis, continue antibiotic therapy, awaiting for ophthalmologist and infectious disease specialist input, improving clinically #2. Left foot ulceration do to prior injury, patient was seen by Santiago Glad, wound care specialist, recommended debridement by podiatrist, wound care follow-up as outpatient, supportive therapy for now #3. Elevated troponin, get echocardiogram, cardiology input, initiate nitroglycerin  topically, continue diltiazem, heparin subcutaneously, aspirin #4 Essential hypertension, continue home medications, adding nitroglycerin glycerin topically  #5 tobacco abuse counselling, d/w patient for 4 minutes, nicotine replacement therapy offered, agreeable.  Management plans discussed with the patient, family and they are in agreement.   DRUG ALLERGIES:  Allergies  Allergen Reactions  . Cinnamon Anaphylaxis  . Cinoxate Anaphylaxis  . Tylenol [Acetaminophen] Anaphylaxis  . Garlic Hives  . Onion Hives and Swelling    CODE STATUS:     Code Status Orders        Start     Ordered   07/13/16 0309  Full code  Continuous     07/13/16 0308    Code Status History    Date Active Date Inactive Code Status Order ID Comments User Context   06/06/2016 10:13 PM 06/11/2016 12:51 AM Full Code [id number]  Henreitta Leber, MD Inpatient      TOTAL TIME TAKING CARE OF THIS PATIENT:40 minutes.    [name] M.D on 07/13/2016 at 1:40 PM  Between 7am to 6pm - Pager - [phone]  After 6pm go to [email or web address] - password [email or web address] [facility]  Office  [phone]  CC: Primary care physician; Tula Nakayama, MD

## 2016-07-13 NOTE — Progress Notes (Signed)
Dialysis started 

## 2016-07-13 NOTE — Progress Notes (Signed)
Post dialysis 

## 2016-07-13 NOTE — ED Provider Notes (Addendum)
-----------------------------------------   1:03 AM on 07/13/2016 -----------------------------------------   Blood pressure (!) 183/82, pulse 95, temperature 98.3 F (36.8 C), temperature source Oral, resp. rate (!) 32, height 5\' 5"  (1.651 m), weight 190 lb (86.2 kg), SpO2 99 %.  Assuming care from Dr. Mariea Clonts.  In short, [name] is a 50 y.o. female with a chief complaint of Facial Swelling and Chest Pain .  Refer to the original H&P for additional details.  The current plan of care is to follow up the results of the MRI.   MRI orbits: Swelling with edema involving the right preseptal soft tissues and right face, nonspecific, but may reflect acute cellulitis in the correct clinical setting. There is abnormal edema extending posteriorly into the right inferior orbit surrounding the right inferior rectus muscle, suspicious for postseptal extension. No abscess or drainable fluid collection identified on this noncontrast examination. No other complication at this time  Due to the results of the MRI it appears as though the patient has orbital cellulitis. She does not have any abscess at this time. She is receiving vancomycin. I will give her a as well as dose of ceftriaxone and she will be admitted to the hospitalist service. The patient will later receive ophthalmologic or otolaryngologic consultation. The patient did receive a dose of morphine for her pain. 4 mg IV as well as Zofran 4 mg IV.   Loney Hering, MD 07/13/16 0105    Loney Hering, MD 07/13/16 [phone]

## 2016-07-13 NOTE — Care Management Note (Signed)
Case Management Note  Patient Details  Name: [name] MRN: [id number] Date of Birth: 10/17/1966  Subjective/Objective:     [name] was admitted 07/12/16 with right eye orbit cellulitis. Hx: ESRD on dialysis, Emphesma, HTN) Reports that her NP is Josephine Cables. Pharmacy=[facility] in [place]. She is currently residing at the [facility] with her son and his girlfriend. No home oxygen. Has a cane, RW, and BSC. Uses [facility] to get to appointments. Currently open to [facility] for RN only. Verified this with Floydene Flock at [facility]. Case management will follow for discharge planning.               Action/Plan:   Expected Discharge Date:                  Expected Discharge Plan:     In-House Referral:     Discharge planning Services     Post Acute Care Choice:    Choice offered to:     DME Arranged:    DME Agency:     HH Arranged:    HH Agency:     Status of Service:     If discussed at [employer] of Stay Meetings, dates discussed:    Additional Comments:  Rito Lecomte A, RN 07/13/2016, 2:51 PM

## 2016-07-13 NOTE — Progress Notes (Signed)
Pre Dialysis 

## 2016-07-13 NOTE — Care Management Important Message (Signed)
Important Message  Patient Details  Name: [name] MRN: [id number] Date of Birth: June 06, 1966   Medicare Important Message Given:  Yes    Josceline Chenard A, RN 07/13/2016, 7:44 AM

## 2016-07-13 NOTE — Progress Notes (Signed)
Dialysis complete

## 2016-07-13 NOTE — H&P (Signed)
[facility] [place] NAME: [name]    MR#:  [id number]  DATE OF BIRTH:  [birth year]  DATE OF ADMISSION:  07/12/2016  PRIMARY CARE PHYSICIAN: Tula Nakayama, MD   REQUESTING/REFERRING PHYSICIAN:   CHIEF COMPLAINT:   Chief Complaint  Patient presents with  . Facial Swelling  . Chest Pain    HISTORY OF PRESENT ILLNESS: [name]  is a 50 y.o. female with a known history of End-stage renal disease on dialysis, congestive heart failure, hypertension, hyperlipidemia, COPD presented to the emergency room because of swelling around the right eye and right cheek area since yesterday morning. Patient woke up Saturday morning and found small skin breakdown in the right eye. Later on it gradually increased in size and became swollen around the right eye and beneath the right eyelid. The movements of the right eye are painful. There is no change in vision in the right eye. Swelling and redness around the right upper eyelid as well as bili the right lower eyelid and upper part of the right cheek area. Tenderness also noted in the lower area. No history of any fever or chills. Patient had similar swelling in the left side of the face in the past when she had a dental abscess. Patient was evaluated in the emergency room she was found to have orbital cellulitis on MRI scan and was given IV vancomycin and IV Rocephin antibiotics. Hospitalist service was consulted for further care of the patient. No complaints of any chest pain. No history of any shortness of breath. No history of any headache dizziness or any blurry vision.  PAST MEDICAL HISTORY:   Past Medical History:  Diagnosis Date  . Anemia   . Anxiety   . Arthritis   . CHF (congestive heart failure) ([place])   . Chronic kidney disease   . COPD (chronic obstructive pulmonary disease) ([place])   . Depression   . Diabetes mellitus without complication ([place])   . Emphysema of lung ([place])   . End  stage renal disease ([place])   . Hepatitis   . Hypercholesterolemia   . Hypertension   . Tobacco dependence     PAST SURGICAL HISTORY: Past Surgical History:  Procedure Laterality Date  . CHOLECYSTECTOMY    . cyst removed  from rt hand    . PERIPHERAL VASCULAR CATHETERIZATION N/A 07/25/2015   Procedure: A/V Shuntogram/Fistulagram;  Surgeon: Algernon Huxley, MD;  Location: [facility];  Service: Cardiovascular;  Laterality: N/A;  . PERIPHERAL VASCULAR CATHETERIZATION Left 07/25/2015   Procedure: A/V Shunt Intervention;  Surgeon: Algernon Huxley, MD;  Location: [facility];  Service: Cardiovascular;  Laterality: Left;  . PERIPHERAL VASCULAR CATHETERIZATION Left 10/07/2015   Procedure: A/V Shuntogram/Fistulagram;  Surgeon: Algernon Huxley, MD;  Location: [facility];  Service: Cardiovascular;  Laterality: Left;  . PERIPHERAL VASCULAR CATHETERIZATION N/A 10/07/2015   Procedure: A/V Shunt Intervention;  Surgeon: Algernon Huxley, MD;  Location: [facility];  Service: Cardiovascular;  Laterality: N/A;  . PERIPHERAL VASCULAR CATHETERIZATION  10/07/2015   Procedure: Dialysis/Perma Catheter Insertion;  Surgeon: Algernon Huxley, MD;  Location: [facility];  Service: Cardiovascular;;  . PERIPHERAL VASCULAR CATHETERIZATION N/A 12/17/2015   Procedure: Dialysis/Perma Catheter Removal;  Surgeon: Katha Cabal, MD;  Location: [facility];  Service: Cardiovascular;  Laterality: N/A;  . rt. tubal and ovary removed    . TONSILLECTOMY  SOCIAL HISTORY:  Social History  Substance Use Topics  . Smoking status: Current Every Day Smoker    Packs/day: 0.50    Years: 17.00    Types: Cigarettes  . Smokeless tobacco: Never Used  . Alcohol use No    FAMILY HISTORY:  Family History  Problem Relation Age of Onset  . Hypertension Mother   . Heart disease Mother   . Hypertension Father     DRUG ALLERGIES:  Allergies  Allergen Reactions  . Cinnamon Anaphylaxis  .  Cinoxate Anaphylaxis  . Tylenol [Acetaminophen] Anaphylaxis  . Garlic Hives  . Onion Hives and Swelling    REVIEW OF SYSTEMS:   CONSTITUTIONAL: No fever, fatigue or weakness.  EYES: No blurred or double vision. Redness and swelling around right eye and tenderness noted too EARS, NOSE, AND THROAT: No tinnitus or ear pain.  RESPIRATORY: No cough, shortness of breath, wheezing or hemoptysis.  CARDIOVASCULAR: No chest pain, orthopnea, edema.  GASTROINTESTINAL: No nausea, vomiting, diarrhea or abdominal pain.  GENITOURINARY: No dysuria, hematuria.  ENDOCRINE: No polyuria, nocturia,  HEMATOLOGY: No anemia, easy bruising or bleeding SKIN:redness and swelling around right eye seen. MUSCULOSKELETAL: No joint pain or arthritis.   NEUROLOGIC: No tingling, numbness, weakness.  PSYCHIATRY: No anxiety or depression.   MEDICATIONS AT HOME:  Prior to Admission medications   Medication Sig Start Date End Date Taking? Authorizing Provider  b complex-vitamin c-folic acid (NEPHRO-VITE) 0.8 MG TABS tablet Take 1 tablet by mouth daily.   Yes Historical Provider, MD  clobetasol ointment (TEMOVATE) [id number] % Apply 1 application topically daily.   Yes Historical Provider, MD  diltiazem (CARDIZEM SR) 120 MG 12 hr capsule Take 120 mg by mouth 2 (two) times daily.   Yes Historical Provider, MD  hydrALAZINE (APRESOLINE) 100 MG tablet Take 100 mg by mouth 2 (two) times daily.   Yes Historical Provider, MD  insulin glargine (LANTUS) 100 UNIT/ML injection Inject 15 Units into the skin at bedtime.    Yes Historical Provider, MD  insulin regular (NOVOLIN R,HUMULIN R) 100 units/mL injection Inject 5-10 Units into the skin 3 (three) times daily before meals. Pt uses per sliding scale.   Yes Historical Provider, MD  omeprazole (PRILOSEC) 40 MG capsule Take 40 mg by mouth daily.   Yes Historical Provider, MD  Oxycodone HCl 10 MG TABS Take 10 mg by mouth 4 (four) times daily as needed (for pain).   Yes Historical Provider, MD   sevelamer carbonate (RENVELA) 800 MG tablet Take 1,600 mg by mouth 3 (three) times daily with meals.   Yes Historical Provider, MD  venlafaxine XR (EFFEXOR-XR) 75 MG 24 hr capsule Take 150 mg by mouth daily.   Yes Historical Provider, MD  clindamycin (CLEOCIN) 300 MG capsule Take 1 capsule (300 mg total) by mouth 3 (three) times daily. 07/12/16 07/22/16  Nance Pear, MD  traMADol (ULTRAM) 50 MG tablet Take 1 tablet (50 mg total) by mouth every 6 (six) hours as needed for severe pain. 07/12/16 07/12/17  Nance Pear, MD      PHYSICAL EXAMINATION:   VITAL SIGNS: Blood pressure (!) 175/82, pulse 92, temperature 98.3 F (36.8 C), temperature source Oral, resp. rate 16, height 5\' 5"  (1.651 m), weight 86.2 kg (190 lb), SpO2 97 %.  GENERAL:  50 y.o.-year-old patient lying in the bed with no acute distress.  EYES: Pupils equal, round, reactive to light and accommodation. No scleral icterus. Extraocular muscles intact. Swelling and redness around right eye. Tenderness also noted. HEENT: Head  atraumatic, normocephalic. Oropharynx and nasopharynx clear.  NECK:  Supple, no jugular venous distention. No thyroid enlargement, no tenderness.  LUNGS: Normal breath sounds bilaterally, no wheezing, rales,rhonchi or crepitation. No use of accessory muscles of respiration.  CARDIOVASCULAR: S1, S2 normal. No murmurs, rubs, or gallops.  ABDOMEN: Soft, nontender, nondistended. Bowel sounds present. No organomegaly or mass.  EXTREMITIES: No pedal edema, cyanosis, or clubbing.  NEUROLOGIC: Cranial nerves II through XII are intact. Muscle strength 5/5 in all extremities. Sensation intact. Gait not checked.  PSYCHIATRIC: The patient is alert and oriented x 3.  SKIN: No obvious rash, lesion, or ulcer.   LABORATORY PANEL:   CBC  Recent Labs Lab 07/12/16 1834  WBC 8.1  HGB 11.7*  HCT 34.2*  PLT 159  MCV 92.7  MCH 31.6  MCHC 34.1  RDW 15.0*    ------------------------------------------------------------------------------------------------------------------  Chemistries   Recent Labs Lab 07/12/16 1834  NA 131*  K 4.4  CL 99*  CO2 20*  GLUCOSE 155*  BUN 45*  CREATININE 4.88*  CALCIUM 8.7*   ------------------------------------------------------------------------------------------------------------------ estimated creatinine clearance is 14.8 mL/min (by C-G formula based on SCr of 4.88 mg/dL). ------------------------------------------------------------------------------------------------------------------ No results for input(s): TSH, T4TOTAL, T3FREE, THYROIDAB in the last 72 hours.  Invalid input(s): FREET3   Coagulation profile No results for input(s): INR, PROTIME in the last 168 hours. ------------------------------------------------------------------------------------------------------------------- No results for input(s): DDIMER in the last 72 hours. -------------------------------------------------------------------------------------------------------------------  Cardiac Enzymes  Recent Labs Lab 07/12/16 1834 07/12/16 2131  TROPONINI 0.03* <0.03   ------------------------------------------------------------------------------------------------------------------ Invalid input(s): POCBNP  ---------------------------------------------------------------------------------------------------------------  Urinalysis    Component Value Date/Time   COLORURINE Yellow 10/18/2014 0445   APPEARANCEUR Cloudy 10/18/2014 0445   LABSPEC 1.016 10/18/2014 0445   PHURINE 5.0 10/18/2014 0445   GLUCOSEU >=500 10/18/2014 0445   HGBUR 2+ 10/18/2014 0445   BILIRUBINUR Negative 10/18/2014 0445   KETONESUR Negative 10/18/2014 0445   PROTEINUR >=500 10/18/2014 0445   NITRITE Negative 10/18/2014 0445   LEUKOCYTESUR 2+ 10/18/2014 0445     RADIOLOGY: Dg Chest 2 View  Result Date: 07/12/2016 CLINICAL DATA:  Patient  with shortness of breath. EXAM: CHEST  2 VIEW COMPARISON:  Chest radiograph 04/06/2016; chest CT 06/10/2016 FINDINGS: Normal cardiac and mediastinal contours. No consolidative pulmonary opacities. No pleural effusion or pneumothorax. Thoracic spine degenerative changes. IMPRESSION: No active cardiopulmonary disease. Electronically Signed   By: Lovey Newcomer M.D.   On: 07/12/2016 19:01  Mr Farley Ly Cm  Result Date: 07/13/2016 CLINICAL DATA:  Initial evaluation for acute right eye pain and swelling. Concern for cellulitis. EXAM: MRI OF THE ORBITS WITHOUT CONTRAST TECHNIQUE: Multiplanar, multisequence MR imaging of the orbits was performed. No intravenous contrast was administered. COMPARISON:  None. FINDINGS: Study mildly limited due to lack of IV contrast and motion artifact. Soft tissue swelling with edema present within the right preseptal soft tissues extending into the right face/cheek. Edema primarily involves the right lower eyelid. Findings suspicious for possible acute preseptal cellulitis. There is mild edema surrounding the right inferior rectus muscle, with involvement of both the intraconal and extraconal fat, suspicious for possible postseptal extension (series 5, image 18). Possible mild edema within the inferior rectus muscle itself. Remainder of the intraconal extraconal fat well-maintained without significant inflammatory changes. Right optic nerve within normal limits. Remainder of the right extraocular muscles within normal limits. Right superior orbital vein of normal size and symmetric with the left. Visualized portions of the cavernous sinus within normal limits. Right globe itself intact and normal in appearance. Lacrimal gland within  normal limits. No loculated collection or findings to suggest abscess on this noncontrast examination. No acute abnormality identified about the left globe and orbit. Intraconal and extraconal fat well-preserved. Left-sided extraocular muscles, optic nerve, and  superior orbital vein within normal limits. Lacrimal gland normal. No significant soft tissue swelling within the left preseptal soft tissues. Visualized portions of the brain demonstrate no acute abnormality. Major intracranial vascular flow voids are maintained. Visualized parotid glands within normal limits. Visualized paranasal sinuses are largely clear. IMPRESSION: Swelling with edema involving the right preseptal soft tissues and right face, nonspecific, but may reflect acute cellulitis in the correct clinical setting. There is abnormal edema extending posteriorly into the right inferior orbit surrounding the right inferior rectus muscle, suspicious for postseptal extension. No abscess or drainable fluid collection identified on this noncontrast examination. No other complication at this time. Electronically Signed   By: Jeannine Boga M.D.   On: 07/13/2016 00:37   EKG: Orders placed or performed during the hospital encounter of 07/12/16  . ED EKG within 10 minutes  . ED EKG within 10 minutes  . EKG 12-Lead  . EKG 12-Lead    IMPRESSION AND PLAN: 50 year old female patient with history of end-stage renal disease on dialysis, COPD, hypertension, heart failure presented to the emergency room with swelling and redness around the right eye. Admitting diagnosis 1. Right eye orbital cellulitis 2. Mild hyponatremia 3. End-stage renal disease on dialysis 4. Emphysema 5. Uncontrolled hypertension Treatment plan Admit patient to medical floor inpatient service Start patient on IV vancomycin and IV Zosyn antibiotic pharmacy to dose based on renal parameters Nephrology consultation for dialysis ENT consultation for orbital cellulitis Follow-up sodium level Pain management DVT prophylaxis subcutaneous heparin No evidence of any abscess noted on MRI of the orbit  All the records are reviewed and case discussed with ED provider. Management plans discussed with the patient, family and they  are in agreement.  CODE STATUS:FULL Code Status History    Date Active Date Inactive Code Status Order ID Comments User Context   06/06/2016 10:13 PM 06/11/2016 12:51 AM Full Code [id number]  Henreitta Leber, MD Inpatient       TOTAL TIME TAKING CARE OF THIS PATIENT: 52 minutes.    Saundra Shelling M.D on 07/13/2016 at 2:04 AM  Between 7am to 6pm - Pager - [phone]  After 6pm go to [email or web address] - password [email or web address] [place] [facility]  Office  [phone]  CC: Primary care physician; Tula Nakayama, MD

## 2016-07-13 NOTE — Progress Notes (Signed)
Pharmacy Antibiotic Note  [name] is a 50 y.o. female admitted on 07/12/2016 with periorbital cellulitis.  Pharmacy has been consulted for Zosyn and vancomycin dosing. Hemodialysis pt- MWF  Plan: 1. Zosyn 3.375 gm IV Q12H EI 2. Vancomycin 1.5 gm IV x 1 now followed by vancomycin 750 mg IV Qdialysis MWF based on predialysis random level. Pharmacy will continue to follow and adjust.   7/24- Patient received Vancomycin Loading dose and regular dose before receiving HD. Will check trough level prior to next HD on Wed 7/26.     Height: 5\' 5"  (165.1 cm) Weight: 190 lb (86.2 kg) IBW/kg (Calculated) : 57  Temp (24hrs), Avg:98.6 F (37 C), Min:98.1 F (36.7 C), Max:99.1 F (37.3 C)   Recent Labs Lab 07/12/16 1834 07/13/16 0526  WBC 8.1 6.3  CREATININE 4.88* 5.40*    Estimated Creatinine Clearance: 13.4 mL/min (by C-G formula based on SCr of 5.4 mg/dL).    Allergies  Allergen Reactions  . Cinnamon Anaphylaxis  . Cinoxate Anaphylaxis  . Tylenol [Acetaminophen] Anaphylaxis  . Garlic Hives  . Onion Hives and Swelling    Thank you for allowing pharmacy to be a part of this patient's care.  Cidney Kirkwood A, Pharm.D., BCPS Clinical Pharmacist 07/13/2016 4:27 PM

## 2016-07-13 NOTE — Consult Note (Signed)
[facility] Nurse wound consult note Reason for Consult:Left plantar foot chronic neuropathic wound, beneath first metatarsal head.    Wound type:Chronic neuropathic Pressure Ulcer POA: Yes Measurement:2 cm x 1.5 cm unable to visualize wound bed due to the presence of devitalized tissue.  Periwound callous, extending 6 cm circumferentially.   Wound bed: devitalized tissue.  Drainage (amount, consistency, odor) Minimal serosanguinous drainage.   Periwound:Hard, intact callous circumferentially Dressing procedure/placement/frequency:Cleanse wound to left plantar foot with NS.  Apply dry dressing.  Wrap with kerlix and tape.  Change daily.  Recommending wound care center after discharge.  Need to trim callous.  Will not follow at this time.  Please re-consult if needed.  Domenic Moras RN BSN [place] Pager [phone]

## 2016-07-13 NOTE — Progress Notes (Signed)
Inpatient Diabetes Program Recommendations  AACE/ADA: New Consensus Statement on Inpatient Glycemic Control (2015)  Target Ranges:  Prepandial:   less than 140 mg/dL      Peak postprandial:   less than 180 mg/dL (1-2 hours)      Critically ill patients:  140 - 180 mg/dL   Results for [name], [name] (MRN [id number]) as of 07/13/2016 10:51  Ref. Range 07/12/2016 18:34 07/13/2016 05:26  Glucose Latest Ref Range: 65 - 99 mg/dL 155 (H) 212 (H)   Review of Glycemic Control  Diabetes history: DM2 Outpatient Diabetes medications: Lantus 15 units QHS Current orders for Inpatient glycemic control: Lantus 15 units QHS  Inpatient Diabetes Program Recommendations: Correction (SSI): Please order CBGs with Novolog correction scale ACHS. HgbA1C: Please consider ordering an A1C to evaluate glycemic control over the past 2-3 months.  Thanks, Barnie Alderman, RN, MSN, CDE Diabetes Coordinator Inpatient Diabetes Program [phone] (Team Pager from [place] to [place]) [phone] (AP office) [phone] [facility] office) [phone] [facility] office)

## 2016-07-13 NOTE — Progress Notes (Signed)
Pt complained of chest pain.  Dr Clayton Bibles notified.  Ordered to give 1 nitro sublingual and 1 inch of nitro paste.  Chest pain relieved as of 11:52.  Straight cath done per order.  250 ml clear yellow urine noted. Sample sent to lab.  Vs 150/63, 81, 96%, 18

## 2016-07-14 ENCOUNTER — Inpatient Hospital Stay
Admit: 2016-07-14 | Discharge: 2016-07-14 | Disposition: A | Discharge status: Home / Self Care | Payer: Medicare Other | Attending: Internal Medicine | Admitting: Internal Medicine

## 2016-07-14 ENCOUNTER — Inpatient Hospital Stay: Payer: Medicare Other

## 2016-07-14 LAB — HEPATITIS B SURFACE ANTIGEN: HEP B S AG: NEGATIVE

## 2016-07-14 LAB — ECHOCARDIOGRAM COMPLETE
HEIGHTINCHES: 65 in
Weight: 3078.4 oz

## 2016-07-14 LAB — GLUCOSE, CAPILLARY
GLUCOSE-CAPILLARY: 123 mg/dL — AB (ref 65–99)
GLUCOSE-CAPILLARY: 95 mg/dL (ref 65–99)
GLUCOSE-CAPILLARY: 78 mg/dL (ref 65–99)
Glucose-Capillary: 126 mg/dL — ABNORMAL HIGH (ref 65–99)

## 2016-07-14 LAB — HEPATITIS B SURFACE ANTIBODY, QUANTITATIVE: HEPATITIS B-POST: 15.5 m[IU]/mL

## 2016-07-14 LAB — PARATHYROID HORMONE, INTACT (NO CA): PTH: 114 pg/mL — ABNORMAL HIGH (ref 15–65)

## 2016-07-14 MED ORDER — DIPHENHYDRAMINE HCL 25 MG PO CAPS
25.0000 mg | ORAL_CAPSULE | Freq: Three times a day (TID) | ORAL | Status: DC | PRN
Start: 2016-07-14 — End: 2016-07-18
  Administered 2016-07-14 (×2): 25 mg via ORAL
  Filled 2016-07-14 (×2): qty 1

## 2016-07-14 MED ORDER — DOCUSATE SODIUM 100 MG PO CAPS
100.0000 mg | ORAL_CAPSULE | Freq: Two times a day (BID) | ORAL | Status: DC
  Administered 2016-07-14 – 2016-07-18 (×9): 100 mg via ORAL
  Filled 2016-07-14 (×9): qty 1

## 2016-07-14 NOTE — Progress Notes (Signed)
*  PRELIMINARY RESULTS* Echocardiogram 2D Echocardiogram has been performed.  [name] 07/14/2016, 9:51 AM

## 2016-07-14 NOTE — Consult Note (Signed)
Subjective: Pt with 26 year history of DM. H/o PDR OU with PRP OU and know retinal and preretinal heme OD . C/o right facial pain and swelling for 3 days. Has improved with IV vanc and Zosyn. Mild visual disturbance OD.  Objective: Vital signs in last 24 hours: Temp:  [98.1 F (36.7 C)-99.1 F (37.3 C)] 98.8 F (37.1 C) (07/25 0456) Pulse Rate:  [82-91] 89 (07/25 0456) Resp:  [12-20] 17 (07/25 0456) BP: (125-173)/(55-79) 132/62 (07/25 1018) SpO2:  [91 %-99 %] 93 % (07/25 0456) Weight:  [87.3 kg (192 lb 6.4 oz)-89.6 kg (197 lb 8.5 oz)] 87.3 kg (192 lb 6.4 oz) (07/24 2037)    Exam  :  NCV J3 OD  J8 OS with central vision missing as before.  Pupils w/o APD.  IOP: ne resistance to retropulsion OU. Globes soft OU. Motility normal with only mild discomfort in extreme gaze. No Diplopia. Upper and lower lids OD erythematous and somewhat tender. Small central eschar RLL of unclear significance.   Anterior segment normal OU x mild cataracts,. Fundus exam reveal extensive PRP laser scars OU. Marked BDR OU in all quadrants. Active PDR OD with retinal heme inferiorly. Optic  nerves normal Ou.   Recent Labs  07/12/16 1834 07/13/16 0526  WBC 8.1 6.3  HGB 11.7* 10.2*  HCT 34.2* 29.8*  NA 131* 133*  K 4.4 4.4  CL 99* 103  CO2 20* 22  BUN 45* 49*  CREATININE 4.88* 5.40*    Studies/Results: Dg Chest 2 View  Result Date: 07/12/2016 CLINICAL DATA:  Patient with shortness of breath. EXAM: CHEST  2 VIEW COMPARISON:  Chest radiograph 04/06/2016; chest CT 06/10/2016 FINDINGS: Normal cardiac and mediastinal contours. No consolidative pulmonary opacities. No pleural effusion or pneumothorax. Thoracic spine degenerative changes. IMPRESSION: No active cardiopulmonary disease. Electronically Signed   By: Lovey Newcomer M.D.   On: 07/12/2016 19:01  Mr Farley Ly Cm  Result Date: 07/13/2016 CLINICAL DATA:  Initial evaluation for acute right eye pain and swelling. Concern for cellulitis. EXAM: MRI OF THE  ORBITS WITHOUT CONTRAST TECHNIQUE: Multiplanar, multisequence MR imaging of the orbits was performed. No intravenous contrast was administered. COMPARISON:  None. FINDINGS: Study mildly limited due to lack of IV contrast and motion artifact. Soft tissue swelling with edema present within the right preseptal soft tissues extending into the right face/cheek. Edema primarily involves the right lower eyelid. Findings suspicious for possible acute preseptal cellulitis. There is mild edema surrounding the right inferior rectus muscle, with involvement of both the intraconal and extraconal fat, suspicious for possible postseptal extension (series 5, image 18). Possible mild edema within the inferior rectus muscle itself. Remainder of the intraconal extraconal fat well-maintained without significant inflammatory changes. Right optic nerve within normal limits. Remainder of the right extraocular muscles within normal limits. Right superior orbital vein of normal size and symmetric with the left. Visualized portions of the cavernous sinus within normal limits. Right globe itself intact and normal in appearance. Lacrimal gland within normal limits. No loculated collection or findings to suggest abscess on this noncontrast examination. No acute abnormality identified about the left globe and orbit. Intraconal and extraconal fat well-preserved. Left-sided extraocular muscles, optic nerve, and superior orbital vein within normal limits. Lacrimal gland normal. No significant soft tissue swelling within the left preseptal soft tissues. Visualized portions of the brain demonstrate no acute abnormality. Major intracranial vascular flow voids are maintained. Visualized parotid glands within normal limits. Visualized paranasal sinuses are largely clear. IMPRESSION: Swelling with edema  involving the right preseptal soft tissues and right face, nonspecific, but may reflect acute cellulitis in the correct clinical setting. There is  abnormal edema extending posteriorly into the right inferior orbit surrounding the right inferior rectus muscle, suspicious for postseptal extension. No abscess or drainable fluid collection identified on this noncontrast examination. No other complication at this time. Electronically Signed   By: Jeannine Boga M.D.   On: 07/13/2016 00:37    Assessment/Plan: Based on clinical exam and MRI this is predominantly a preseptal cellulitis with possible mild extension to the right orbit. Would proceede with follow up imaging as ordered and watch closely for further improvement. If stalled improvement, or worsening clinically or by imaging , consider biopsy with ENT to rule out fungal infection.    I will follow up in one day.  LOS: 1 day   [name] 7/25/201711:26 AM

## 2016-07-14 NOTE — Progress Notes (Signed)
[facility]  ROUNDING NOTE   Subjective:  Patient seen at bedside. Presents now with right periorbital edema and cellulitis. Believes that she may have been scratched by her cat. She completed hemodialysis yesterday.   Objective:  Vital signs in last 24 hours:  Temp:  [98.1 F (36.7 C)-99.1 F (37.3 C)] 98.8 F (37.1 C) (07/25 0456) Pulse Rate:  [83-91] 89 (07/25 0456) Resp:  [12-20] 17 (07/25 0456) BP: (125-173)/(55-79) 132/62 (07/25 1018) SpO2:  [91 %-97 %] 93 % (07/25 0456) Weight:  [87.3 kg (192 lb 6.4 oz)-89.6 kg (197 lb 8.5 oz)] 87.3 kg (192 lb 6.4 oz) (07/24 2037)  Weight change: 3.416 kg (7 lb 8.5 oz) Filed Weights   07/13/16 1650 07/13/16 2007 07/13/16 2037  Weight: 89.6 kg (197 lb 8.5 oz) 88.1 kg (194 lb 3.6 oz) 87.3 kg (192 lb 6.4 oz)    Intake/Output: I/O last 3 completed shifts: In: 1200 [P.O.:1005; IV Piggyback:195] Out: [id number] [Urine:250; Other:1500]   Intake/Output this shift:  Total I/O In: 540 [P.O.:540] Out: 500 [Urine:500]  Physical Exam: General: No acute distress, sitting up in bed  Head: Normocephalic, atraumatic. Moist oral mucosal membranes  Eyes: Right periorbital cellulitis  Neck: Supple, trachea midline  Lungs:  Clear to auscultation, normal effort  Heart: S1S2 no rubs  Abdomen:  Soft, nontender, bowel sounds present  Extremities: Trace peripheral edema.  Neurologic: Nonfocal, moving all four extremities  Skin: No lesions  Access: Left upper extremity AV fistula    Basic Metabolic Panel:  Recent Labs Lab 07/12/16 1834 07/13/16 0526  NA 131* 133*  K 4.4 4.4  CL 99* 103  CO2 20* 22  GLUCOSE 155* 212*  BUN 45* 49*  CREATININE 4.88* 5.40*  CALCIUM 8.7* 8.2*  PHOS  --  4.3    Liver Function Tests: No results for input(s): AST, ALT, ALKPHOS, BILITOT, PROT, ALBUMIN in the last 168 hours. No results for input(s): LIPASE, AMYLASE in the last 168 hours. No results for input(s): AMMONIA in the last 168  hours.  CBC:  Recent Labs Lab 07/12/16 1834 07/13/16 0526  WBC 8.1 6.3  HGB 11.7* 10.2*  HCT 34.2* 29.8*  MCV 92.7 92.7  PLT 159 121*    Cardiac Enzymes:  Recent Labs Lab 07/12/16 1834 07/12/16 2131  TROPONINI 0.03* <0.03    BNP: Invalid input(s): POCBNP  CBG:  Recent Labs Lab 07/13/16 0318 07/13/16 1641 07/13/16 2105 07/14/16 0741 07/14/16 1212  GLUCAP 101* 198* 151* 123* 95    Microbiology: Results for orders placed or performed during the hospital encounter of 07/12/16  MRSA PCR Screening     Status: None   Collection Time: 07/13/16  5:26 AM  Result Value Ref Range Status   MRSA by PCR NEGATIVE NEGATIVE Final    Comment:        The GeneXpert MRSA Assay (FDA approved for NASAL specimens only), is one component of a comprehensive MRSA colonization surveillance program. It is not intended to diagnose MRSA infection nor to guide or monitor treatment for MRSA infections.     Coagulation Studies: No results for input(s): LABPROT, INR in the last 72 hours.  Urinalysis: No results for input(s): COLORURINE, LABSPEC, PHURINE, GLUCOSEU, HGBUR, BILIRUBINUR, KETONESUR, PROTEINUR, UROBILINOGEN, NITRITE, LEUKOCYTESUR in the last 72 hours.  Invalid input(s): APPERANCEUR    Imaging: Dg Chest 2 View  Result Date: 07/12/2016 CLINICAL DATA:  Patient with shortness of breath. EXAM: CHEST  2 VIEW COMPARISON:  Chest radiograph 04/06/2016; chest CT 06/10/2016 FINDINGS: Normal  cardiac and mediastinal contours. No consolidative pulmonary opacities. No pleural effusion or pneumothorax. Thoracic spine degenerative changes. IMPRESSION: No active cardiopulmonary disease. Electronically Signed   By: Lovey Newcomer M.D.   On: 07/12/2016 19:01  Ct Maxillofacial Wo Contrast  Result Date: 07/14/2016 CLINICAL DATA:  Right orbital cellulitis. Right facial swelling and pain for 3 days. EXAM: CT MAXILLOFACIAL WITHOUT CONTRAST TECHNIQUE: Multidetector CT imaging of the maxillofacial  structures was performed. Multiplanar CT image reconstructions were also generated. A small metallic BB was placed on the right temple in order to reliably differentiate right from left. COMPARISON:  Orbital MRI 07/12/2016 FINDINGS: IV contrast could not be administered due to diminished renal function. Right orbital preseptal soft tissue swelling is again seen extending into the right facial soft tissues, overall moderate in degree and similar to the recent prior MRI. Extraconal and mild intraconal edema about the inferior rectus muscle on the prior MRI is not well seen by CT. Certainly, there is no evidence of progressive postseptal infection. No abscess is identified on this unenhanced study. The globes appear intact. The left orbit is unremarkable. Minimal mucosal thickening is noted in the ethmoid sinuses. The mastoid air cells are clear. No acute osseous abnormality is identified. Calcified atherosclerosis is noted at the skullbase. Mild cerebral atrophy is partially visualized. IMPRESSION: Unchanged, moderate right preseptal/ facial edema consistent with cellulitis. No evidence of significant postseptal involvement by CT. Electronically Signed   By: Logan Bores M.D.   On: 07/14/2016 12:34  Mr Farley Ly Cm  Result Date: 07/13/2016 CLINICAL DATA:  Initial evaluation for acute right eye pain and swelling. Concern for cellulitis. EXAM: MRI OF THE ORBITS WITHOUT CONTRAST TECHNIQUE: Multiplanar, multisequence MR imaging of the orbits was performed. No intravenous contrast was administered. COMPARISON:  None. FINDINGS: Study mildly limited due to lack of IV contrast and motion artifact. Soft tissue swelling with edema present within the right preseptal soft tissues extending into the right face/cheek. Edema primarily involves the right lower eyelid. Findings suspicious for possible acute preseptal cellulitis. There is mild edema surrounding the right inferior rectus muscle, with involvement of both the  intraconal and extraconal fat, suspicious for possible postseptal extension (series 5, image 18). Possible mild edema within the inferior rectus muscle itself. Remainder of the intraconal extraconal fat well-maintained without significant inflammatory changes. Right optic nerve within normal limits. Remainder of the right extraocular muscles within normal limits. Right superior orbital vein of normal size and symmetric with the left. Visualized portions of the cavernous sinus within normal limits. Right globe itself intact and normal in appearance. Lacrimal gland within normal limits. No loculated collection or findings to suggest abscess on this noncontrast examination. No acute abnormality identified about the left globe and orbit. Intraconal and extraconal fat well-preserved. Left-sided extraocular muscles, optic nerve, and superior orbital vein within normal limits. Lacrimal gland normal. No significant soft tissue swelling within the left preseptal soft tissues. Visualized portions of the brain demonstrate no acute abnormality. Major intracranial vascular flow voids are maintained. Visualized parotid glands within normal limits. Visualized paranasal sinuses are largely clear. IMPRESSION: Swelling with edema involving the right preseptal soft tissues and right face, nonspecific, but may reflect acute cellulitis in the correct clinical setting. There is abnormal edema extending posteriorly into the right inferior orbit surrounding the right inferior rectus muscle, suspicious for postseptal extension. No abscess or drainable fluid collection identified on this noncontrast examination. No other complication at this time. Electronically Signed   By: Pincus Badder.D.  On: 07/13/2016 00:37    Medications:     . aspirin EC  325 mg Oral Daily  . diltiazem  120 mg Oral BID  . docusate sodium  100 mg Oral BID  . heparin  5,000 Units Subcutaneous Q8H  . hydrALAZINE  100 mg Oral BID  . insulin aspart   0-5 Units Subcutaneous QHS  . insulin aspart  0-9 Units Subcutaneous TID WC  . insulin aspart  4 Units Subcutaneous TID WC  . insulin glargine  15 Units Subcutaneous QHS  . multivitamin  1 tablet Oral QHS  . nitroGLYCERIN  1 inch Topical Q6H  . pantoprazole  40 mg Oral Q breakfast  . piperacillin-tazobactam (ZOSYN)  IV  3.375 g Intravenous Q12H  . sevelamer carbonate  1,600 mg Oral TID WC  . sodium chloride flush  3 mL Intravenous Q12H  . vancomycin  750 mg Intravenous Q M,W,F-HD  . venlafaxine XR  150 mg Oral Daily   sodium chloride, sodium chloride, sodium chloride, ALPRAZolam, alteplase, diphenhydrAMINE, heparin, lidocaine (PF), lidocaine-prilocaine, morphine injection, nicotine polacrilex, nitroGLYCERIN, ondansetron **OR** ondansetron (ZOFRAN) IV, oxyCODONE, pentafluoroprop-tetrafluoroeth, senna-docusate, sodium chloride flush  Assessment/ Plan:  49 y.o. female  with medical history of hypertension, diastolic heart failure, diabetes mellitus type 2, and anemia of chronic kidney disease, hepatitis C, depression, psoriasis, peripheral neuropathy, ESRD, admission for left foot blister/LLE cellulitis, right periorbital cellulitis  [facility] MWF  1.  End-stage renal disease on dialysis MWF. - Patient completed hemodialysis yesterday.  No acute indication for dialysis today.  We will plan for dialysis again tomorrow.  2. Anemia chronic and disease: hemoglobin up to 10.2 as compared to last admission.  Restart Epogen with dialysis.  3. Secondary hyperparathyroidism. -  Continue Renvela 1600 mg by mouth 3 times a day with meals, phosphorus is well-controlled at 4.3.  4. Hypertension. Blood pressure currently 132/62 - Continue diltiazem and hydralazine.  5. Right periorbital cellulitis.  Patient receiving vancomycin and Zosyn.  She is being monitored actively by ENT and ophthalmology.   LOS: 1 Melroy Bougher 7/25/20171:15 PM

## 2016-07-14 NOTE — Consult Note (Signed)
[name], [place] [id number] [birth year] Olivia Grist, MD  Reason for Consult: Right periorbital cellulitis  HPI: The patient is a 50 year old white female who small inflamed area and right cheek that she felt was a pimple. She scratched the head of it off and dried squeeze it but nothing would come out. The next day she had more swelling around her right cheek and extended around the entire right periorbita. She was admitted to the hospital 2 days ago and had a MRI scan of her orbit. He did not show any evidence of opacified sinuses that could be a source of her orbital infection. He did show cellulitis of the cheeks but no evidence of abscess. She has been on IV vancomycin and Zosyn. She feels like swelling, down the better although still stings and is somewhat firm. She had a culture done of her nasal cavity at admission and this was negative for MRSA. She has not had a history of MRSA that she knows of  Allergies:  Allergies  Allergen Reactions  . Cinnamon Anaphylaxis  . Cinoxate Anaphylaxis  . Tylenol [Acetaminophen] Anaphylaxis  . Garlic Hives  . Onion Hives and Swelling    ROS: Review of systems normal other than 12 systems except per HPI.  PMH:  Past Medical History:  Diagnosis Date  . Anemia   . Anxiety   . Arthritis   . CHF (congestive heart failure) ([place])   . Chronic kidney disease   . COPD (chronic obstructive pulmonary disease) ([place])   . Depression   . Diabetes mellitus without complication ([place])   . Emphysema of lung ([place])   . End stage renal disease ([place])   . Hepatitis   . Hypercholesterolemia   . Hypertension   . Tobacco dependence     FH:  Family History  Problem Relation Age of Onset  . Hypertension Mother   . Heart disease Mother   . Hypertension Father     SH:  Social History   Social History  . Marital status: Legally Separated    Spouse name: N/A  . Number of children: N/A  . Years of education: N/A   Occupational History  . disabled     Social History Main Topics  . Smoking status: Current Every Day Smoker    Packs/day: 0.50    Years: 17.00    Types: Cigarettes  . Smokeless tobacco: Never Used  . Alcohol use No  . Drug use: No  . Sexual activity: Not on file   Other Topics Concern  . Not on file   Social History Narrative  . No narrative on file    PSH:  Past Surgical History:  Procedure Laterality Date  . CHOLECYSTECTOMY    . cyst removed  from rt hand    . PERIPHERAL VASCULAR CATHETERIZATION N/A 07/25/2015   Procedure: A/V Shuntogram/Fistulagram;  Surgeon: Algernon Huxley, MD;  Location: [place] [facility];  Service: Cardiovascular;  Laterality: N/A;  . PERIPHERAL VASCULAR CATHETERIZATION Left 07/25/2015   Procedure: A/V Shunt Intervention;  Surgeon: Algernon Huxley, MD;  Location: [place] [facility];  Service: Cardiovascular;  Laterality: Left;  . PERIPHERAL VASCULAR CATHETERIZATION Left 10/07/2015   Procedure: A/V Shuntogram/Fistulagram;  Surgeon: Algernon Huxley, MD;  Location: [place];  Service: Cardiovascular;  Laterality: Left;  . PERIPHERAL VASCULAR CATHETERIZATION N/A 10/07/2015   Procedure: A/V Shunt Intervention;  Surgeon: Algernon Huxley, MD;  Location: [place] [facility];  Service: Cardiovascular;  Laterality: N/A;  . PERIPHERAL VASCULAR  CATHETERIZATION  10/07/2015   Procedure: Dialysis/Perma Catheter Insertion;  Surgeon: Algernon Huxley, MD;  Location: [place] [facility];  Service: Cardiovascular;;  . PERIPHERAL VASCULAR CATHETERIZATION N/A 12/17/2015   Procedure: Dialysis/Perma Catheter Removal;  Surgeon: Katha Cabal, MD;  Location: [place] [facility];  Service: Cardiovascular;  Laterality: N/A;  . rt. tubal and ovary removed    . TONSILLECTOMY      Physical  Exam: Well-developed and well-nourished white female CN 2-12 grossly intact and symmetric. Skin of her face shows redness involving the right malar region and the upper and lower lids. The swelling is mostly gone on the lids but  still has an area that is raised in more indurated overlying the right zygoma. This has a scab over the top of it. Nasal cavity without polyps or purulence. External nose and ears without masses or lesions. Neck supple with no masses or lesions. No lymphadenopathy palpated.   The scab was lifted off of the wound to the right cheek. Once opened up there was a small ball of green pus on the inside of it and some in the inside of wound. This was cultured and sent for aerobic culture. She can wash the wound with soap and water.   A/P: Patient has orbital cellulitis secondary to a localized skin infection that she was trying to squeeze out and spread the infection subcutaneously. I still suspect this is more likely staph aureus then any other bacteria, and the vancomycin is likely controlling this well. She'll continue to unroofed the scab the next several days as long as there is any sign of drainage underneath it and cellulitis directly around it. We'll continue to wash her face with soap and water. I sent a culture for aerobic culture to see if we can grow something even though she's been on the vancomycin for the last 48 hours. As long as this continues to settle down and she does not need specific follow-up, and no further surgery is necessary. Call me if problems.   [name] H 07/14/2016 6:31 PM

## 2016-07-14 NOTE — Progress Notes (Signed)
[name] [facility] [place] NAME: [name]    MR#:  [id number]  DATE OF BIRTH:  June 05, 1966  SUBJECTIVE:  CHIEF COMPLAINT:   Chief Complaint  Patient presents with  . Facial Swelling  . Chest Pain  The patient is 50 year old female with past history significant for history of congestive heart failure, COPD, COPD, diabetes mellitus, end-stage renal disease, on dialysis, hyperlipidemia, who presents to the hospital with complaints of chest pains right facial swelling and redness. On arrival to the hospital patient was noted to have right facial cellulitis, extending to both septal area. Patient was initiated on broad-spectrum antibiotic therapy with vancomycin and Zosyn, her condition improved, swelling and redness is subsiding, as well as pain. No chest pain today. Echocardiogram is pending. Discussed with ID, Dr. Graylon Good in regards to antibiotic therapy, IV vancomycin was recommended to be continued for at least 24 more hours.  Review of Systems  Constitutional: Negative for chills, fever and weight loss.  HENT: Negative for congestion.   Eyes: Positive for discharge. Negative for blurred vision and double vision.  Respiratory: Negative for cough, sputum production, shortness of breath and wheezing.   Cardiovascular: Positive for chest pain. Negative for palpitations, orthopnea, leg swelling and PND.  Gastrointestinal: Negative for abdominal pain, blood in stool, constipation, diarrhea, nausea and vomiting.  Genitourinary: Negative for dysuria, frequency, hematuria and urgency.  Musculoskeletal: Negative for falls.  Neurological: Negative for dizziness, tremors, focal weakness and headaches.  Endo/Heme/Allergies: Does not bruise/bleed easily.  Psychiatric/Behavioral: Negative for depression. The patient does not have insomnia.     VITAL SIGNS: Blood pressure 132/62, pulse 89, temperature 98.8 F (37.1 C), temperature source Oral, resp. rate  17, height 5\' 5"  (1.651 m), weight 87.3 kg (192 lb 6.4 oz), SpO2 93 %.  PHYSICAL EXAMINATION:   GENERAL:  50 y.o.-year-old patient lying in the bed in mild distress due to discomfort and swelling of right face, periorbital area.  EYES: Pupils equal, round, reactive to light and accommodation. No scleral icterus. Extraocular muscles intact. No ocular discharge significant swelling of upper and lower right lid, periorbital area. Erythema, increased warmth and significant induration was noted on palpation, no fluctuations. Patient has small ulceration/?necrotic area in the right upper cheek with surrounding edema, painful to palpation. No purulence was expressed HEENT: Head atraumatic, normocephalic. Oropharynx and nasopharynx clear.  NECK:  Supple, no jugular venous distention. No thyroid enlargement, no tenderness.  LUNGS: Better air entrance bilaterally, no wheezing, rales,rhonchi or crepitation. No use of accessory muscles of respiration.  CARDIOVASCULAR: S1, S2 normal. No murmurs, rubs, or gallops.  ABDOMEN: Soft, nontender, nondistended. Bowel sounds present. No organomegaly or mass.  EXTREMITIES: No pedal edema, cyanosis, or clubbing. Left lower extremity pedal callus with some ulceration was noted in the forefoot, no discharge. No purulence or pain on palpation NEUROLOGIC: Cranial nerves II through XII are intact. Muscle strength 5/5 in all extremities. Sensation intact. Gait not checked.  PSYCHIATRIC: The patient is alert and oriented x 3.  SKIN: No obvious rash, lesion, or ulcer.   ORDERS/RESULTS REVIEWED:   CBC  Recent Labs Lab 07/12/16 1834 07/13/16 0526  WBC 8.1 6.3  HGB 11.7* 10.2*  HCT 34.2* 29.8*  PLT 159 121*  MCV 92.7 92.7  MCH 31.6 31.7  MCHC 34.1 34.2  RDW 15.0* 15.2*   ------------------------------------------------------------------------------------------------------------------  Chemistries   Recent Labs Lab 07/12/16 1834 07/13/16 0526  NA 131* 133*  K  4.4 4.4  CL 99* 103  CO2 20* 22  GLUCOSE 155* 212*  BUN 45* 49*  CREATININE 4.88* 5.40*  CALCIUM 8.7* 8.2*   ------------------------------------------------------------------------------------------------------------------ estimated creatinine clearance is 13.4 mL/min (by C-G formula based on SCr of 5.4 mg/dL). ------------------------------------------------------------------------------------------------------------------ No results for input(s): TSH, T4TOTAL, T3FREE, THYROIDAB in the last 72 hours.  Invalid input(s): FREET3  Cardiac Enzymes  Recent Labs Lab 07/12/16 1834 07/12/16 2131  TROPONINI 0.03* <0.03   ------------------------------------------------------------------------------------------------------------------ Invalid input(s): POCBNP ---------------------------------------------------------------------------------------------------------------  RADIOLOGY: Dg Chest 2 View  Result Date: 07/12/2016 CLINICAL DATA:  Patient with shortness of breath. EXAM: CHEST  2 VIEW COMPARISON:  Chest radiograph 04/06/2016; chest CT 06/10/2016 FINDINGS: Normal cardiac and mediastinal contours. No consolidative pulmonary opacities. No pleural effusion or pneumothorax. Thoracic spine degenerative changes. IMPRESSION: No active cardiopulmonary disease. Electronically Signed   By: Lovey Newcomer M.D.   On: 07/12/2016 19:01  Mr Farley Ly Cm  Result Date: 07/13/2016 CLINICAL DATA:  Initial evaluation for acute right eye pain and swelling. Concern for cellulitis. EXAM: MRI OF THE ORBITS WITHOUT CONTRAST TECHNIQUE: Multiplanar, multisequence MR imaging of the orbits was performed. No intravenous contrast was administered. COMPARISON:  None. FINDINGS: Study mildly limited due to lack of IV contrast and motion artifact. Soft tissue swelling with edema present within the right preseptal soft tissues extending into the right face/cheek. Edema primarily involves the right lower eyelid. Findings  suspicious for possible acute preseptal cellulitis. There is mild edema surrounding the right inferior rectus muscle, with involvement of both the intraconal and extraconal fat, suspicious for possible postseptal extension (series 5, image 18). Possible mild edema within the inferior rectus muscle itself. Remainder of the intraconal extraconal fat well-maintained without significant inflammatory changes. Right optic nerve within normal limits. Remainder of the right extraocular muscles within normal limits. Right superior orbital vein of normal size and symmetric with the left. Visualized portions of the cavernous sinus within normal limits. Right globe itself intact and normal in appearance. Lacrimal gland within normal limits. No loculated collection or findings to suggest abscess on this noncontrast examination. No acute abnormality identified about the left globe and orbit. Intraconal and extraconal fat well-preserved. Left-sided extraocular muscles, optic nerve, and superior orbital vein within normal limits. Lacrimal gland normal. No significant soft tissue swelling within the left preseptal soft tissues. Visualized portions of the brain demonstrate no acute abnormality. Major intracranial vascular flow voids are maintained. Visualized parotid glands within normal limits. Visualized paranasal sinuses are largely clear. IMPRESSION: Swelling with edema involving the right preseptal soft tissues and right face, nonspecific, but may reflect acute cellulitis in the correct clinical setting. There is abnormal edema extending posteriorly into the right inferior orbit surrounding the right inferior rectus muscle, suspicious for postseptal extension. No abscess or drainable fluid collection identified on this noncontrast examination. No other complication at this time. Electronically Signed   By: Jeannine Boga M.D.   On: 07/13/2016 00:37   EKG:  Orders placed or performed during the hospital encounter of  07/12/16  . ED EKG within 10 minutes  . ED EKG within 10 minutes  . EKG 12-Lead  . EKG 12-Lead    ASSESSMENT AND PLAN:  Principal Problem:   Orbital cellulitis on right #1. Periorbital and possibly post-septal cellulitis, continue Intravenous broad-spectrum antibiotic therapy, the patient was seen by ENT and ophthalmologist, no sinus disease was noted, suspected localized disease, questionable cat Scratch, the patient was also seen by ophthalmologist, CT of maxillofacial area was ordered for today, pending results, I discussed patient's case with Dr. Graylon Good,  [facility] infectious disease specialist, I sent her images of patient's facial area, awaiting input, recommended to continue antibiotic therapy for next 24 hours, overall clinically, patient is improving . Microbiology is negative so far #2. Left foot ulceration do to prior injury, patient was seen by Santiago Glad, wound care specialist, recommended debridement by podiatrist, wound care follow-up as outpatient, supportive therapy for now #3. Elevated troponin, pending echocardiogram results, appreciate cardiology input, continue nitroglycerin topically, continue diltiazem, heparin subcutaneously, aspirin, blood pressure has improved, could be decreased stress and less pain with therapy.  #4 Essential hypertension, continue home medications, continue nitroglycerin topically , improved blood pressure readings #5 tobacco abuse counselling, d/w patient for 4 minutes yesterday, nicotine replacement therapy offered, agreeable.  Management plans discussed with the patient, family and they are in agreement.   DRUG ALLERGIES:  Allergies  Allergen Reactions  . Cinnamon Anaphylaxis  . Cinoxate Anaphylaxis  . Tylenol [Acetaminophen] Anaphylaxis  . Garlic Hives  . Onion Hives and Swelling    CODE STATUS:     Code Status Orders        Start     Ordered   07/13/16 0309  Full code  Continuous     07/13/16 0308    Code Status History    Date  Active Date Inactive Code Status Order ID Comments User Context   06/06/2016 10:13 PM 06/11/2016 12:51 AM Full Code [id number]  Henreitta Leber, MD Inpatient      TOTAL TIME TAKING CARE OF THIS PATIENT:45 minutes.  Discussed with Dr. Graylon Good, ID, off Juan Quam M.D on 07/14/2016 at 12:26 PM  Between 7am to 6pm - Pager - [phone]  After 6pm go to [email or web address] - password [email or web address] [facility]  Office  [phone]  CC: Primary care physician; Tula Nakayama, MD

## 2016-07-15 LAB — GLUCOSE, CAPILLARY
GLUCOSE-CAPILLARY: 132 mg/dL — AB (ref 65–99)
GLUCOSE-CAPILLARY: 69 mg/dL (ref 65–99)
Glucose-Capillary: 147 mg/dL — ABNORMAL HIGH (ref 65–99)

## 2016-07-15 LAB — VANCOMYCIN, TROUGH: VANCOMYCIN TR: 13 ug/mL — AB (ref 15–20)

## 2016-07-15 MED ORDER — EPOETIN ALFA 10000 UNIT/ML IJ SOLN
4000.0000 [IU] | INTRAMUSCULAR | Status: DC
  Administered 2016-07-15 – 2016-07-17 (×2): 4000 [IU] via INTRAVENOUS

## 2016-07-15 MED ORDER — VANCOMYCIN HCL IN DEXTROSE 1-5 GM/200ML-% IV SOLN
1000.0000 mg | INTRAVENOUS | Status: DC
  Administered 2016-07-15 – 2016-07-17 (×2): 1000 mg via INTRAVENOUS
  Filled 2016-07-15 (×2): qty 200

## 2016-07-15 MED ORDER — DILTIAZEM HCL ER 90 MG PO CP12
180.0000 mg | ORAL_CAPSULE | Freq: Two times a day (BID) | ORAL | Status: DC
  Administered 2016-07-15 – 2016-07-18 (×5): 180 mg via ORAL
  Filled 2016-07-15 (×6): qty 2

## 2016-07-15 MED ORDER — CEPASTAT 14.5 MG MT LOZG
1.0000 | LOZENGE | OROMUCOSAL | Status: DC | PRN
  Filled 2016-07-15: qty 9

## 2016-07-15 MED ORDER — MENTHOL 3 MG MT LOZG
1.0000 | LOZENGE | OROMUCOSAL | Status: DC | PRN
  Filled 2016-07-15: qty 9

## 2016-07-15 NOTE — Progress Notes (Signed)
PRE HD ASSESSMENT 

## 2016-07-15 NOTE — Progress Notes (Signed)
Post HD  

## 2016-07-15 NOTE — Progress Notes (Signed)
HD TX started  

## 2016-07-15 NOTE — Progress Notes (Signed)
[facility]  ROUNDING NOTE   Subjective:  Patient appears to have worsening of the erythema around her right eye. ENT continues to follow closely. She is due for hemodialysis today. Patient remains on broad-spectrum antibiotic therapy.   Objective:  Vital signs in last 24 hours:  Temp:  [98.1 F (36.7 C)-98.7 F (37.1 C)] 98.7 F (37.1 C) (07/26 0505) Pulse Rate:  [77-91] 80 (07/26 1059) Resp:  [20-28] 20 (07/26 1059) BP: (133-150)/(62-68) 133/63 (07/26 1059) SpO2:  [95 %-97 %] 97 % (07/26 1059)  Weight change:  Filed Weights   07/13/16 1650 07/13/16 2007 07/13/16 2037  Weight: 89.6 kg (197 lb 8.5 oz) 88.1 kg (194 lb 3.6 oz) 87.3 kg (192 lb 6.4 oz)    Intake/Output: I/O last 3 completed shifts: In: [id number] [P.O.:1220] Out: 2300 [Urine:800; Other:1500]   Intake/Output this shift:  Total I/O In: 240 [P.O.:240] Out: 700 [Urine:700]  Physical Exam: General: No acute distress, sitting up in bed  Head: Normocephalic, atraumatic. Moist oral mucosal membranes  Eyes: Right periorbital cellulitis, increasing erythema  Neck: Supple, trachea midline  Lungs:  Clear to auscultation, normal effort  Heart: S1S2 no rubs  Abdomen:  Soft, nontender, bowel sounds present  Extremities: Trace peripheral edema.  Neurologic: Nonfocal, moving all four extremities  Skin: No lesions  Access: Left upper extremity AV fistula    Basic Metabolic Panel:  Recent Labs Lab 07/12/16 1834 07/13/16 0526  NA 131* 133*  K 4.4 4.4  CL 99* 103  CO2 20* 22  GLUCOSE 155* 212*  BUN 45* 49*  CREATININE 4.88* 5.40*  CALCIUM 8.7* 8.2*  PHOS  --  4.3    Liver Function Tests: No results for input(s): AST, ALT, ALKPHOS, BILITOT, PROT, ALBUMIN in the last 168 hours. No results for input(s): LIPASE, AMYLASE in the last 168 hours. No results for input(s): AMMONIA in the last 168 hours.  CBC:  Recent Labs Lab 07/12/16 1834 07/13/16 0526  WBC 8.1 6.3  HGB 11.7* 10.2*  HCT 34.2*  29.8*  MCV 92.7 92.7  PLT 159 121*    Cardiac Enzymes:  Recent Labs Lab 07/12/16 1834 07/12/16 2131  TROPONINI 0.03* <0.03    BNP: Invalid input(s): POCBNP  CBG:  Recent Labs Lab 07/14/16 1212 07/14/16 1636 07/14/16 2136 07/15/16 0751 07/15/16 1123  GLUCAP 95 78 126* 132* 61    Microbiology: Results for orders placed or performed during the hospital encounter of 07/12/16  MRSA PCR Screening     Status: None   Collection Time: 07/13/16  5:26 AM  Result Value Ref Range Status   MRSA by PCR NEGATIVE NEGATIVE Final    Comment:        The GeneXpert MRSA Assay (FDA approved for NASAL specimens only), is one component of a comprehensive MRSA colonization surveillance program. It is not intended to diagnose MRSA infection nor to guide or monitor treatment for MRSA infections.   Aerobic Culture (superficial specimen)     Status: None (Preliminary result)   Collection Time: 07/14/16  6:28 PM  Result Value Ref Range Status   Specimen Description FACE  Final   Special Requests NONE  Final   Gram Stain   Final    RARE WBC PRESENT, PREDOMINANTLY PMN NO ORGANISMS SEEN Performed at [facility]    Culture PENDING  Incomplete   Report Status PENDING  Incomplete    Coagulation Studies: No results for input(s): LABPROT, INR in the last 72 hours.  Urinalysis: No results for input(s):  COLORURINE, LABSPEC, [place], GLUCOSEU, HGBUR, BILIRUBINUR, KETONESUR, PROTEINUR, UROBILINOGEN, NITRITE, LEUKOCYTESUR in the last 72 hours.  Invalid input(s): APPERANCEUR    Imaging: Ct Maxillofacial Wo Contrast  Result Date: 07/14/2016 CLINICAL DATA:  Right orbital cellulitis. Right facial swelling and pain for 3 days. EXAM: CT MAXILLOFACIAL WITHOUT CONTRAST TECHNIQUE: Multidetector CT imaging of the maxillofacial structures was performed. Multiplanar CT image reconstructions were also generated. A small metallic BB was placed on the right temple in order to reliably  differentiate right from left. COMPARISON:  Orbital MRI 07/12/2016 FINDINGS: IV contrast could not be administered due to diminished renal function. Right orbital preseptal soft tissue swelling is again seen extending into the right facial soft tissues, overall moderate in degree and similar to the recent prior MRI. Extraconal and mild intraconal edema about the inferior rectus muscle on the prior MRI is not well seen by CT. Certainly, there is no evidence of progressive postseptal infection. No abscess is identified on this unenhanced study. The globes appear intact. The left orbit is unremarkable. Minimal mucosal thickening is noted in the ethmoid sinuses. The mastoid air cells are clear. No acute osseous abnormality is identified. Calcified atherosclerosis is noted at the skullbase. Mild cerebral atrophy is partially visualized. IMPRESSION: Unchanged, moderate right preseptal/ facial edema consistent with cellulitis. No evidence of significant postseptal involvement by CT. Electronically Signed   By: Logan Bores M.D.   On: 07/14/2016 12:34    Medications:     . aspirin EC  325 mg Oral Daily  . diltiazem  120 mg Oral BID  . docusate sodium  100 mg Oral BID  . heparin  5,000 Units Subcutaneous Q8H  . hydrALAZINE  100 mg Oral BID  . insulin aspart  0-5 Units Subcutaneous QHS  . insulin aspart  0-9 Units Subcutaneous TID WC  . insulin aspart  4 Units Subcutaneous TID WC  . insulin glargine  15 Units Subcutaneous QHS  . multivitamin  1 tablet Oral QHS  . nitroGLYCERIN  1 inch Topical Q6H  . pantoprazole  40 mg Oral Q breakfast  . piperacillin-tazobactam (ZOSYN)  IV  3.375 g Intravenous Q12H  . sevelamer carbonate  1,600 mg Oral TID WC  . sodium chloride flush  3 mL Intravenous Q12H  . vancomycin  750 mg Intravenous Q M,W,F-HD  . venlafaxine XR  150 mg Oral Daily   sodium chloride, sodium chloride, sodium chloride, ALPRAZolam, alteplase, diphenhydrAMINE, heparin, lidocaine (PF),  lidocaine-prilocaine, menthol-cetylpyridinium, morphine injection, nicotine polacrilex, nitroGLYCERIN, ondansetron **OR** ondansetron (ZOFRAN) IV, oxyCODONE, pentafluoroprop-tetrafluoroeth, senna-docusate, sodium chloride flush  Assessment/ Plan:  50 y.o. female  with medical history of hypertension, diastolic heart failure, diabetes mellitus type 2, and anemia of chronic kidney disease, hepatitis C, depression, psoriasis, peripheral neuropathy, ESRD, admission for left foot blister/LLE cellulitis, right periorbital cellulitis  [facility] MWF  1.  End-stage renal disease on dialysis MWF. - Patient due for hemodialysis today. Orders have been prepared.  2. Anemia chronic and disease: Hemoglobin 10.2 at last check.  Restart on Epogen 4000 units IV with dialysis.  3. Secondary hyperparathyroidism. -   follow-up phosphorus today. Continue Renvela 2 tablets by mouth 3 times a day with meals.  4. Hypertension. Blood pressure currently 133/63. - Continue diltiazem and hydralazine.  5. Right periorbital cellulitis.  This appears to be slightly worse today. Erythema has increased. ENT was following. The patient is on vancomycin and Zosyn. Recommend continued clinical monitoring and low threshold for reimaging as necessary. Defer treatment and management to  hospitalist.   LOS: 2 Anecia Nusbaum 7/26/201711:23 AM

## 2016-07-15 NOTE — Progress Notes (Signed)
[facility] [place] NAME: [name]    MR#:  [id number]  DATE OF BIRTH:  Feb 24, 1966  SUBJECTIVE:  CHIEF COMPLAINT:   Chief Complaint  Patient presents with  . Facial Swelling  . Chest Pain  The patient is 50 year old female with past history significant for history of congestive heart failure, COPD, COPD, diabetes mellitus, end-stage renal disease, on dialysis, hyperlipidemia, who presents to the hospital with complaints of chest pains right facial swelling and redness. On arrival to the hospital patient was noted to have right facial cellulitis, extending to both septal area. Patient was initiated on broad-spectrum antibiotic therapy with vancomycin and Zosyn, her condition improved, swelling and redness is subsiding, as well as pain. No chest pain today. Echocardiogram was normal. Discussed with ID, Dr. Graylon Good in regards to antibiotic therapy yesterday, IV vancomycin was recommended to be continued . Today, patient complains of more pain and soreness in the right cheek, possibly more swelling after attempt to express pus yesterday. No discharge was noted. Wound culture did not show any bacteria, but leukocytes only. Patient is afebrile  Review of Systems  Constitutional: Negative for chills, fever and weight loss.  HENT: Negative for congestion.   Eyes: Negative for blurred vision, double vision and discharge.  Respiratory: Negative for cough, sputum production, shortness of breath and wheezing.   Cardiovascular: Negative for chest pain, palpitations, orthopnea, leg swelling and PND.  Gastrointestinal: Negative for abdominal pain, blood in stool, constipation, diarrhea, nausea and vomiting.  Genitourinary: Negative for dysuria, frequency, hematuria and urgency.  Musculoskeletal: Negative for falls.  Neurological: Negative for dizziness, tremors, focal weakness and headaches.  Endo/Heme/Allergies: Does not bruise/bleed easily.   Psychiatric/Behavioral: Negative for depression. The patient does not have insomnia.     VITAL SIGNS: Blood pressure (!) 145/67, pulse 82, temperature 98.6 F (37 C), temperature source Oral, resp. rate 14, height 5\' 5"  (1.651 m), weight 87.3 kg (192 lb 6.4 oz), SpO2 97 %.  PHYSICAL EXAMINATION:   GENERAL:  50 y.o.-year-old patient lying in the bed in mild distress due to discomfort and swelling of right face, periorbital area, which, by comparison to photograph yesterday, is improving.  EYES: Pupils equal, round, reactive to light and accommodation. No scleral icterus. Extraocular muscles intact. No ocular discharge significant swelling of upper and lower right lid, periorbital area. Erythema, increased warmth and induration was noted on palpation, no fluctuations, induration is resolving. Patient has small ulceration/?necrotic area in the right upper cheek with surrounding edema, painful to palpation, induration seemed to be resolving. No purulence was expressed. Erythema is resolving as compared to photograph from yesterday HEENT: Head atraumatic, normocephalic. Oropharynx and nasopharynx clear.  NECK:  Supple, no jugular venous distention. No thyroid enlargement, no tenderness.  LUNGS: Good air entrance bilaterally, no wheezing, rales,rhonchi or crepitation. No use of accessory muscles of respiration.  CARDIOVASCULAR: S1, S2 normal. No murmurs, rubs, or gallops.  ABDOMEN: Soft, nontender, nondistended. Bowel sounds present. No organomegaly or mass.  EXTREMITIES: No pedal edema, cyanosis, or clubbing. Left lower extremity pedal callus with some ulceration was noted in the forefoot, no discharge. No purulence or pain on palpation NEUROLOGIC: Cranial nerves II through XII are intact. Muscle strength 5/5 in all extremities. Sensation intact. Gait not checked.  PSYCHIATRIC: The patient is alert and oriented x 3.  SKIN: No obvious rash, lesion, or ulcer.   ORDERS/RESULTS REVIEWED:    CBC  Recent Labs Lab 07/12/16 1834 07/13/16 0526  WBC 8.1  6.3  HGB 11.7* 10.2*  HCT 34.2* 29.8*  PLT 159 121*  MCV 92.7 92.7  MCH 31.6 31.7  MCHC 34.1 34.2  RDW 15.0* 15.2*   ------------------------------------------------------------------------------------------------------------------  Chemistries   Recent Labs Lab 07/12/16 1834 07/13/16 0526  NA 131* 133*  K 4.4 4.4  CL 99* 103  CO2 20* 22  GLUCOSE 155* 212*  BUN 45* 49*  CREATININE 4.88* 5.40*  CALCIUM 8.7* 8.2*   ------------------------------------------------------------------------------------------------------------------ estimated creatinine clearance is 13.4 mL/min (by C-G formula based on SCr of 5.4 mg/dL). ------------------------------------------------------------------------------------------------------------------ No results for input(s): TSH, T4TOTAL, T3FREE, THYROIDAB in the last 72 hours.  Invalid input(s): FREET3  Cardiac Enzymes  Recent Labs Lab 07/12/16 1834 07/12/16 2131  TROPONINI 0.03* <0.03   ------------------------------------------------------------------------------------------------------------------ Invalid input(s): POCBNP ---------------------------------------------------------------------------------------------------------------  RADIOLOGY: Ct Maxillofacial Wo Contrast  Result Date: 07/14/2016 CLINICAL DATA:  Right orbital cellulitis. Right facial swelling and pain for 3 days. EXAM: CT MAXILLOFACIAL WITHOUT CONTRAST TECHNIQUE: Multidetector CT imaging of the maxillofacial structures was performed. Multiplanar CT image reconstructions were also generated. A small metallic BB was placed on the right temple in order to reliably differentiate right from left. COMPARISON:  Orbital MRI 07/12/2016 FINDINGS: IV contrast could not be administered due to diminished renal function. Right orbital preseptal soft tissue swelling is again seen extending into the right facial soft  tissues, overall moderate in degree and similar to the recent prior MRI. Extraconal and mild intraconal edema about the inferior rectus muscle on the prior MRI is not well seen by CT. Certainly, there is no evidence of progressive postseptal infection. No abscess is identified on this unenhanced study. The globes appear intact. The left orbit is unremarkable. Minimal mucosal thickening is noted in the ethmoid sinuses. The mastoid air cells are clear. No acute osseous abnormality is identified. Calcified atherosclerosis is noted at the skullbase. Mild cerebral atrophy is partially visualized. IMPRESSION: Unchanged, moderate right preseptal/ facial edema consistent with cellulitis. No evidence of significant postseptal involvement by CT. Electronically Signed   By: Logan Bores M.D.   On: 07/14/2016 12:34   EKG:  Orders placed or performed during the hospital encounter of 07/12/16  . ED EKG within 10 minutes  . ED EKG within 10 minutes  . EKG 12-Lead  . EKG 12-Lead    ASSESSMENT AND PLAN:  Principal Problem:   Orbital cellulitis on right #1. Periorbital and possibly post-septal cellulitis, continue Intravenous Vancomycin, discontinue Zosyn, the patient was seen by ENT and ophthalmologist, no sinus disease was noted, suspected localized disease, , CT of maxillofacial area yesterday was unremarkable, no new changes. Ophthalmologist is concerned about possible fungal infection, recommended skin/subcutaneous tissue biopsy by ENT no improvement in the next 24-36 hours, discussed with Dr.Porfilio today , attempted to discuss with Dr. Kathyrn Sheriff , paged. Microbiology is negative so far #2. Left foot ulceration do to prior injury, patient was seen by Santiago Glad, wound care specialist, recommended debridement by podiatrist, wound care follow-up as outpatient, supportive therapy for now #3. Elevated troponin, echocardiogram is normal, appreciate cardiology input, continue nitroglycerin topically, continue diltiazem,  heparin subcutaneously, aspirin, no further testing was recommended #4 Essential hypertension, advance home medications, continue nitroglycerin topically , improved blood pressure readings #5 tobacco abuse counselling, d/w patient for 4 minutes yesterday, nicotine replacement therapy offered, agreeable.  Management plans discussed with the patient, family and they are in agreement.   DRUG ALLERGIES:  Allergies  Allergen Reactions  . Cinnamon Anaphylaxis  . Cinoxate Anaphylaxis  . Tylenol [Acetaminophen] Anaphylaxis  . Garlic  Hives  . Onion Hives and Swelling    CODE STATUS:     Code Status Orders        Start     Ordered   07/13/16 0309  Full code  Continuous     07/13/16 0308    Code Status History    Date Active Date Inactive Code Status Order ID Comments User Context   06/06/2016 10:13 PM 06/11/2016 12:51 AM Full Code [id number]  Henreitta Leber, MD Inpatient      TOTAL TIME TAKING CARE OF THIS PATIENT:40 minutes.    [name] M.D on 07/15/2016 at 1:49 PM  Between 7am to 6pm - Pager - [phone]  After 6pm go to [email or web address] - password [email or web address] [facility]  Office  [phone]  CC: Primary care physician; Tula Nakayama, MD

## 2016-07-15 NOTE — Progress Notes (Signed)
PRE HD TX 

## 2016-07-15 NOTE — Progress Notes (Signed)
Post HD ASSESSMENT

## 2016-07-15 NOTE — Consult Note (Signed)
  Subjective: Pt reports increased pain and swelling RLL. General malaise. Vision about the same.  Objective: Vital signs in last 24 hours: Temp:  [98.1 F (36.7 C)-98.7 F (37.1 C)] 98.7 F (37.1 C) (07/26 0505) Pulse Rate:  [77-91] 80 (07/26 1059) Resp:  [20-28] 20 (07/26 1059) BP: (133-150)/(62-68) 133/63 (07/26 1059) SpO2:  [95 %-97 %] 97 % (07/26 1059)    Exam. Increased tenderness and general spreading of firm subcutaneous lesion. Increased erythema and some spreading toward the lateral canthus. NVA J3 as before. Pupils w/o APD. IOP 15/14 by [facility].  Motility normal.    Recent Labs  07/12/16 1834 07/13/16 0526  WBC 8.1 6.3  HGB 11.7* 10.2*  HCT 34.2* 29.8*  NA 131* 133*  K 4.4 4.4  CL 99* 103  CO2 20* 22  BUN 45* 49*  CREATININE 4.88* 5.40*    Studies/Results: Ct Maxillofacial Wo Contrast  Result Date: 07/14/2016 CLINICAL DATA:  Right orbital cellulitis. Right facial swelling and pain for 3 days. EXAM: CT MAXILLOFACIAL WITHOUT CONTRAST TECHNIQUE: Multidetector CT imaging of the maxillofacial structures was performed. Multiplanar CT image reconstructions were also generated. A small metallic BB was placed on the right temple in order to reliably differentiate right from left. COMPARISON:  Orbital MRI 07/12/2016 FINDINGS: IV contrast could not be administered due to diminished renal function. Right orbital preseptal soft tissue swelling is again seen extending into the right facial soft tissues, overall moderate in degree and similar to the recent prior MRI. Extraconal and mild intraconal edema about the inferior rectus muscle on the prior MRI is not well seen by CT. Certainly, there is no evidence of progressive postseptal infection. No abscess is identified on this unenhanced study. The globes appear intact. The left orbit is unremarkable. Minimal mucosal thickening is noted in the ethmoid sinuses. The mastoid air cells are clear. No acute osseous abnormality is  identified. Calcified atherosclerosis is noted at the skullbase. Mild cerebral atrophy is partially visualized. IMPRESSION: Unchanged, moderate right preseptal/ facial edema consistent with cellulitis. No evidence of significant postseptal involvement by CT. Electronically Signed   By: Logan Bores M.D.   On: 07/14/2016 12:34    Assessment/Plan: Specimen sent to path mostly PMNs. No organisms seen.    I am concerned for possible fungal infection given enlarging lesion in right cheek despite appropriate ABX.   Will discuss additional path specimen with primary team/ ENT.   Continue ABX for now.   LOS: 2 days   Brittish Bolinger LOUIS 7/26/201712:36 PM

## 2016-07-15 NOTE — Progress Notes (Signed)
HD TX ended  

## 2016-07-15 NOTE — Progress Notes (Signed)
cbg 143 at 1700.  Info did not transfer to epic.

## 2016-07-15 NOTE — Progress Notes (Signed)
Pharmacy Antibiotic Note  [name] is a 50 y.o. female admitted on 07/12/2016 with periorbital cellulitis.  Pharmacy has been consulted for Zosyn and vancomycin dosing. Hemodialysis pt- MWF  Plan: 1. Zosyn 3.375 gm IV Q12H EI  2. Vancomycin level resulted @ 13 mcg/ml. Will increase dose to Vancomycin 1 g IV qdailysis MWF.     Height: 5\' 5"  (165.1 cm) Weight: 192 lb 6.4 oz (87.3 kg) IBW/kg (Calculated) : 57  Temp (24hrs), Avg:98.5 F (36.9 C), Min:98.1 F (36.7 C), Max:98.7 F (37.1 C)   Recent Labs Lab 07/12/16 1834 07/13/16 0526 07/15/16 1245  WBC 8.1 6.3  --   CREATININE 4.88* 5.40*  --   VANCOTROUGH  --   --  13*    Estimated Creatinine Clearance: 13.4 mL/min (by C-G formula based on SCr of 5.4 mg/dL).    Allergies  Allergen Reactions  . Cinnamon Anaphylaxis  . Cinoxate Anaphylaxis  . Tylenol [Acetaminophen] Anaphylaxis  . Garlic Hives  . Onion Hives and Swelling    Thank you for allowing pharmacy to be a part of this patient's care.  Aradhya Shellenbarger D, Pharm.D., BCPS Clinical Pharmacist 07/15/2016 2:33 PM

## 2016-07-16 ENCOUNTER — Encounter
Admission: EM | Disposition: A | Discharge status: Home Health | Payer: Self-pay | Source: Home / Self Care | Attending: Internal Medicine

## 2016-07-16 HISTORY — PX: INCISION AND DRAINAGE ABSCESS: SHX5864

## 2016-07-16 LAB — GLUCOSE, CAPILLARY
Glucose-Capillary: 92 mg/dL (ref 65–99)
Glucose-Capillary: 123 mg/dL — ABNORMAL HIGH (ref 65–99)
Glucose-Capillary: 78 mg/dL (ref 65–99)
Glucose-Capillary: 119 mg/dL — ABNORMAL HIGH (ref 65–99)

## 2016-07-16 LAB — AEROBIC CULTURE  (SUPERFICIAL SPECIMEN)

## 2016-07-16 LAB — AEROBIC CULTURE W GRAM STAIN (SUPERFICIAL SPECIMEN): Culture: NO GROWTH

## 2016-07-16 SURGERY — INCISION AND DRAINAGE, ABSCESS
Anesthesia: LOCAL | Wound class: Dirty or Infected

## 2016-07-16 MED ORDER — INSULIN GLARGINE 100 UNIT/ML ~~LOC~~ SOLN
7.0000 [IU] | Freq: Once | SUBCUTANEOUS | Status: AC
  Administered 2016-07-16: 7 [IU] via SUBCUTANEOUS
  Filled 2016-07-16: qty 0.07

## 2016-07-16 MED ORDER — MORPHINE SULFATE (PF) 2 MG/ML IV SOLN
2.0000 mg | INTRAVENOUS | Status: DC | PRN
  Administered 2016-07-16: 4 mg via INTRAVENOUS
  Administered 2016-07-17 – 2016-07-18 (×2): 2 mg via INTRAVENOUS
  Filled 2016-07-16: qty 2
  Filled 2016-07-16: qty 1
  Filled 2016-07-16: qty 2
  Filled 2016-07-16: qty 1

## 2016-07-16 MED ORDER — PIPERACILLIN-TAZOBACTAM 3.375 G IVPB
3.3750 g | Freq: Two times a day (BID) | INTRAVENOUS | Status: DC
  Administered 2016-07-16 – 2016-07-18 (×5): 3.375 g via INTRAVENOUS
  Filled 2016-07-16 (×6): qty 50

## 2016-07-16 MED ORDER — INSULIN GLARGINE 100 UNIT/ML ~~LOC~~ SOLN
15.0000 [IU] | Freq: Every day | SUBCUTANEOUS | Status: DC
Start: 2016-07-17 — End: 2016-07-18
  Administered 2016-07-17: 15 [IU] via SUBCUTANEOUS
  Filled 2016-07-16 (×2): qty 0.15

## 2016-07-16 SURGICAL SUPPLY — 16 items
BLADE SURG 15 STRL LF DISP TIS (BLADE) ×1 IMPLANT
BLADE SURG 15 STRL SS (BLADE) ×1
CANISTER SUCT 1200ML W/VALVE (MISCELLANEOUS) ×2 IMPLANT
ELECT CAUTERY BLADE TIP 2.5 (TIP) ×2
ELECT REM PT RETURN 9FT ADLT (ELECTROSURGICAL) ×2
ELECTRODE CAUTERY BLDE TIP 2.5 (TIP) ×1 IMPLANT
ELECTRODE REM PT RTRN 9FT ADLT (ELECTROSURGICAL) ×1 IMPLANT
GLOVE BIO SURGEON STRL SZ7.5 (GLOVE) ×2 IMPLANT
GOWN STRL REUS W/ TWL LRG LVL3 (GOWN DISPOSABLE) ×1 IMPLANT
GOWN STRL REUS W/TWL LRG LVL3 (GOWN DISPOSABLE) ×1
LABEL OR SOLS (LABEL) ×2 IMPLANT
NS IRRIG 500ML POUR BTL (IV SOLUTION) ×2 IMPLANT
PACK HEAD/NECK (MISCELLANEOUS) ×2 IMPLANT
SUCTION FRAZIER HANDLE 10FR (MISCELLANEOUS) ×1
SUCTION TUBE FRAZIER 10FR DISP (MISCELLANEOUS) ×1 IMPLANT
SYR 3ML LL SCALE MARK (SYRINGE) ×2 IMPLANT

## 2016-07-16 NOTE — Progress Notes (Signed)
Pharmacy Antibiotic Note  [name] is a 50 y.o. female admitted on 07/12/2016 with facial abscess.  Pharmacy has been consulted for piperacillin/tazobactam & vancomycin dosing.  This is day #5 of antibiotics  Plan: Continue vancomycin 1000 mg IV with HD on MWF  Resume piperacillin/tazobactam 3.375 g IV q12h EI  Height: 5\' 5"  (165.1 cm) Weight: 188 lb 7.9 oz (85.5 kg) IBW/kg (Calculated) : 57  Temp (24hrs), Avg:98.3 F (36.8 C), Min:98.1 F (36.7 C), Max:98.5 F (36.9 C)   Recent Labs Lab 07/12/16 1834 07/13/16 0526 07/15/16 1245  WBC 8.1 6.3  --   CREATININE 4.88* 5.40*  --   VANCOTROUGH  --   --  13*    Estimated Creatinine Clearance: 13.3 mL/min (by C-G formula based on SCr of 5.4 mg/dL).    Allergies  Allergen Reactions  . Cinnamon Anaphylaxis  . Cinoxate Anaphylaxis  . Tylenol [Acetaminophen] Anaphylaxis  . Garlic Hives  . Onion Hives and Swelling   Antimicrobials this admission: vancomycin 7/23 >>  Piperacillin/tazobactam 7/24 >> 7/26 and resumed on 7/27 >>  Dose adjustments this admission:   Thank you for allowing pharmacy to be a part of this patient's care.  Lenis Noon, PharmD, BCPS 07/16/2016 1:02 PM

## 2016-07-16 NOTE — OR Nursing (Signed)
Patient entered operating room 9. She was transferred to the operating room table and a BP cuff and O2 monitor was placed on the patient. Her vitals were monitored and recorded during the procedure. 11:17 BP 135/68 O2 99 11:21 BP 133/77 O2 98 11:24 BP 142/70 O2 98 11:27 BP 145/73 O2 98 11:30 BP 144/74 O2 98 11:33 BP 154/75 O2 99 11:36 BP 143/71 O2 97 Patient was removed from the monitors and taken to PACU.

## 2016-07-16 NOTE — Op Note (Signed)
..  07/16/2016 11:39 AM    Antonieta Loveless  [id number]   Pre-Op Dx:  Right Facial Abscess and Preseptal Cellulitis  Post-op Dx: same  Proc:  Incision and drainage of right facial abscess with biopsy  Surg:  Samirah Scarpati  Anes:  Local  EBL:  2ccs  Comp:  none  Findings:  Approximately 7 cc's of purulence removed from infraorbital/facial abcess.  Culture for aerobic, anaerobic, and fungus taken.  Separate specimen for off site fungus culture taken as well.  Procedure: With the patient in a comfortable supine position, local anesthesia with 3cc's of 1% Lidocaine with 1:100,000 epinephrine was placed within the area of erythema just below and posterior to her right eye.  The patient was next prepped and draped in a normal fashion.    At this time, the area of crusting was removed with a hemostat which revealed frank purulence and an abscess cavity.  Using a hemostat approximately 7 cc's of purulence was expressed from the abscess cavity.  This extended posteriorly near the canthus, and inferiorly along her maxilla.  After all purlence was expressed, palpation with a hemostat revealed no further septations or cavities.  The wound was irrigated with sterile saline.  The wound was next packed with 1inch iodoform packing and topped with telfa and bacitracin ointment.  The patient was next transferred to PACU in stable condition.    Dispo:   PACU to floor  Plan:  Follow culture results.  Remove packing tomorrow.  Change dressing as needed for drainage.  Crystall Donaldson 07/16/2016 11:39 AM

## 2016-07-16 NOTE — Progress Notes (Signed)
..   07/16/2016 11:02 AM  [name] [id number]    Temp:  [98.1 F (36.7 C)-98.6 F (37 C)] 98.2 F (36.8 C) (07/27 0500) Pulse Rate:  [80-88] 85 (07/27 0500) Resp:  [11-20] 20 (07/27 0500) BP: (127-170)/(61-92) 129/66 (07/27 0906) SpO2:  [95 %-98 %] 96 % (07/27 0500) Weight:  [85.5 kg (188 lb 7.9 oz)] 85.5 kg (188 lb 7.9 oz) (07/26 1530),     Intake/Output Summary (Last 24 hours) at 07/16/16 1102 Last data filed at 07/16/16 0900  Gross per 24 hour  Intake             1088 ml  Output             2700 ml  Net            -1612 ml    Results for orders placed or performed during the hospital encounter of 07/12/16 (from the past 24 hour(s))  Glucose, capillary     Status: None   Collection Time: 07/15/16 11:23 AM  Result Value Ref Range   Glucose-Capillary 69 65 - 99 mg/dL  Vancomycin, trough     Status: Abnormal   Collection Time: 07/15/16 12:45 PM  Result Value Ref Range   Vancomycin Tr 13 (L) 15 - 20 ug/mL  Glucose, capillary     Status: Abnormal   Collection Time: 07/15/16  9:30 PM  Result Value Ref Range   Glucose-Capillary 147 (H) 65 - 99 mg/dL  Glucose, capillary     Status: None   Collection Time: 07/16/16  7:31 AM  Result Value Ref Range   Glucose-Capillary 92 65 - 99 mg/dL   Comment 1 Notify RN     SUBJECTIVE:  Worsening swelling this am now with increased pain.  Discussed with Dr. George Ina who is concerned could be fungal infection and would like biopsy.   OBJECTIVE:  Gen- NAD Face- right sided facial induration and fluctuance and erythema and edema surrounding right eye  IMPRESSION:  Right facial and preseptal cellulitis  PLAN:  I&D of right eye with biopsy for fungal culture.  Risks and benefits discussed with patient.  [name] 07/16/2016, 11:02 AM

## 2016-07-16 NOTE — Progress Notes (Signed)
[facility]  ROUNDING NOTE   Subjective:  Cellulitis surrounding the right eye appears worse. There also appears to be an area of fluctuance. ENT currently following. Patient completed dialysis yesterday.   Objective:  Vital signs in last 24 hours:  Temp:  [98.1 F (36.7 C)-98.6 F (37 C)] 98.2 F (36.8 C) (07/27 0500) Pulse Rate:  [80-88] 85 (07/27 0500) Resp:  [11-20] 20 (07/27 0500) BP: (127-170)/(61-92) 129/66 (07/27 0906) SpO2:  [95 %-98 %] 96 % (07/27 0500) Weight:  [85.5 kg (188 lb 7.9 oz)] 85.5 kg (188 lb 7.9 oz) (07/26 1530)  Weight change:  Filed Weights   07/13/16 2007 07/13/16 2037 07/15/16 1530  Weight: 88.1 kg (194 lb 3.6 oz) 87.3 kg (192 lb 6.4 oz) 85.5 kg (188 lb 7.9 oz)    Intake/Output: I/O last 3 completed shifts: In: [id number] [P.O.:1078; IV Piggyback:250] Out: [id number] [Urine:1200; Other:2500]   Intake/Output this shift:  Total I/O In: 240 [P.O.:240] Out: -   Physical Exam: General: No acute distress, sitting up in bed  Head: Normocephalic, atraumatic. Moist oral mucosal membranes  Eyes: Right periorbital cellulitis, increasing erythema  Neck: Supple, trachea midline  Lungs:  Clear to auscultation, normal effort  Heart: S1S2 no rubs  Abdomen:  Soft, nontender, bowel sounds present  Extremities: Trace peripheral edema.  Neurologic: Nonfocal, moving all four extremities  Skin: No lesions  Access: Left upper extremity AV fistula    Basic Metabolic Panel:  Recent Labs Lab 07/12/16 1834 07/13/16 0526  NA 131* 133*  K 4.4 4.4  CL 99* 103  CO2 20* 22  GLUCOSE 155* 212*  BUN 45* 49*  CREATININE 4.88* 5.40*  CALCIUM 8.7* 8.2*  PHOS  --  4.3    Liver Function Tests: No results for input(s): AST, ALT, ALKPHOS, BILITOT, PROT, ALBUMIN in the last 168 hours. No results for input(s): LIPASE, AMYLASE in the last 168 hours. No results for input(s): AMMONIA in the last 168 hours.  CBC:  Recent Labs Lab 07/12/16 1834 07/13/16 0526   WBC 8.1 6.3  HGB 11.7* 10.2*  HCT 34.2* 29.8*  MCV 92.7 92.7  PLT 159 121*    Cardiac Enzymes:  Recent Labs Lab 07/12/16 1834 07/12/16 2131  TROPONINI 0.03* <0.03    BNP: Invalid input(s): POCBNP  CBG:  Recent Labs Lab 07/14/16 2136 07/15/16 0751 07/15/16 1123 07/15/16 2130 07/16/16 0731  GLUCAP 126* 132* 69 147* 92    Microbiology: Results for orders placed or performed during the hospital encounter of 07/12/16  MRSA PCR Screening     Status: None   Collection Time: 07/13/16  5:26 AM  Result Value Ref Range Status   MRSA by PCR NEGATIVE NEGATIVE Final    Comment:        The GeneXpert MRSA Assay (FDA approved for NASAL specimens only), is one component of a comprehensive MRSA colonization surveillance program. It is not intended to diagnose MRSA infection nor to guide or monitor treatment for MRSA infections.   Aerobic Culture (superficial specimen)     Status: None (Preliminary result)   Collection Time: 07/14/16  6:28 PM  Result Value Ref Range Status   Specimen Description FACE  Final   Special Requests NONE  Final   Gram Stain   Final    RARE WBC PRESENT, PREDOMINANTLY PMN NO ORGANISMS SEEN    Culture   Final    NO GROWTH < 24 HOURS Performed at [facility]    Report Status PENDING  Incomplete  Coagulation Studies: No results for input(s): LABPROT, INR in the last 72 hours.  Urinalysis: No results for input(s): COLORURINE, LABSPEC, PHURINE, GLUCOSEU, HGBUR, BILIRUBINUR, KETONESUR, PROTEINUR, UROBILINOGEN, NITRITE, LEUKOCYTESUR in the last 72 hours.  Invalid input(s): APPERANCEUR    Imaging: Ct Maxillofacial Wo Contrast  Result Date: 07/14/2016 CLINICAL DATA:  Right orbital cellulitis. Right facial swelling and pain for 3 days. EXAM: CT MAXILLOFACIAL WITHOUT CONTRAST TECHNIQUE: Multidetector CT imaging of the maxillofacial structures was performed. Multiplanar CT image reconstructions were also generated. A small metallic BB  was placed on the right temple in order to reliably differentiate right from left. COMPARISON:  Orbital MRI 07/12/2016 FINDINGS: IV contrast could not be administered due to diminished renal function. Right orbital preseptal soft tissue swelling is again seen extending into the right facial soft tissues, overall moderate in degree and similar to the recent prior MRI. Extraconal and mild intraconal edema about the inferior rectus muscle on the prior MRI is not well seen by CT. Certainly, there is no evidence of progressive postseptal infection. No abscess is identified on this unenhanced study. The globes appear intact. The left orbit is unremarkable. Minimal mucosal thickening is noted in the ethmoid sinuses. The mastoid air cells are clear. No acute osseous abnormality is identified. Calcified atherosclerosis is noted at the skullbase. Mild cerebral atrophy is partially visualized. IMPRESSION: Unchanged, moderate right preseptal/ facial edema consistent with cellulitis. No evidence of significant postseptal involvement by CT. Electronically Signed   By: Logan Bores M.D.   On: 07/14/2016 12:34    Medications:     . aspirin EC  325 mg Oral Daily  . diltiazem  180 mg Oral BID  . docusate sodium  100 mg Oral BID  . epoetin (EPOGEN/PROCRIT) injection  4,000 Units Intravenous Q M,W,F-HD  . heparin  5,000 Units Subcutaneous Q8H  . hydrALAZINE  100 mg Oral BID  . insulin aspart  0-5 Units Subcutaneous QHS  . insulin aspart  0-9 Units Subcutaneous TID WC  . insulin aspart  4 Units Subcutaneous TID WC  . insulin glargine  15 Units Subcutaneous QHS  . multivitamin  1 tablet Oral QHS  . nitroGLYCERIN  1 inch Topical Q6H  . pantoprazole  40 mg Oral Q breakfast  . sevelamer carbonate  1,600 mg Oral TID WC  . sodium chloride flush  3 mL Intravenous Q12H  . vancomycin  1,000 mg Intravenous Q M,W,F-HD  . venlafaxine XR  150 mg Oral Daily   sodium chloride, ALPRAZolam, diphenhydrAMINE,  menthol-cetylpyridinium, morphine injection, nicotine polacrilex, nitroGLYCERIN, ondansetron **OR** ondansetron (ZOFRAN) IV, oxyCODONE, senna-docusate, sodium chloride flush  Assessment/ Plan:  50 y.o. female  with medical history of hypertension, diastolic heart failure, diabetes mellitus type 2, and anemia of chronic kidney disease, hepatitis C, depression, psoriasis, peripheral neuropathy, ESRD, admission for left foot blister/LLE cellulitis, right periorbital cellulitis  [facility] MWF  1.  End-stage renal disease on dialysis MWF. - Patient completed hemodialysis yesterday. No acute indication for dialysis today. We will plan for dialysis again tomorrow.  2. Anemia chronic and disease: Continue Epogen 4000 units IV with dialysis.  3. Secondary hyperparathyroidism. -   phosphorus was not drawn yesterday. Consider retrying tomorrow. Continue Renvela 2 tablets by mouth 3 times a day with meals.  4. Hypertension. Blood pressure 129/66 this a.m. indicating good control. - Continue diltiazem and hydralazine.  5. Right periorbital cellulitis.  Area of fluctuance noted under her right eye. Increasing erythema also noted. May need incision and drainage  however defer to ENT and ophthalmology. Continue vancomycin and Zosyn.   LOS: 3 [name] 7/27/201711:01 AM

## 2016-07-16 NOTE — Progress Notes (Signed)
[place] [facility] [place] NAME: [name]    MR#:  [id number]  DATE OF BIRTH:  09-07-1966  SUBJECTIVE:  CHIEF COMPLAINT:   Chief Complaint  Patient presents with  . Facial Swelling  . Chest Pain  The patient is 50 year old female with past history significant for history of congestive heart failure, COPD, COPD, diabetes mellitus, end-stage renal disease, on dialysis, hyperlipidemia, who presents to the hospital with complaints of chest pains right facial swelling and redness. On arrival to the hospital patient was noted to have right facial cellulitis, extending to both septal area. Patient was initiated on broad-spectrum antibiotic therapy with vancomycin and Zosyn, her condition improved, swelling and redness is subsiding, as well as pain. No chest pain today. Echocardiogram was normal. Discussed with ID, Dr. Graylon Good in regards to antibiotic therapy yesterday, IV vancomycin was recommended to be continued . Today the patient complains of more pain and Swelling in the right cheek, extending to the neck on the right. Upon further evaluation, it appears that patient has an abscess formation. Called  Dr. Pryor Ochoa, the patient was taken to the  operating room and incised and drained the abscess. Cultures for bacterial organisms as well as fungal are taken, as well as biopsy.   Review of Systems  Constitutional: Negative for chills, fever and weight loss.  HENT: Negative for congestion.   Eyes: Negative for blurred vision, double vision and discharge.  Respiratory: Negative for cough, sputum production, shortness of breath and wheezing.   Cardiovascular: Negative for chest pain, palpitations, orthopnea, leg swelling and PND.  Gastrointestinal: Negative for abdominal pain, blood in stool, constipation, diarrhea, nausea and vomiting.  Genitourinary: Negative for dysuria, frequency, hematuria and urgency.  Musculoskeletal: Negative for falls.   Neurological: Negative for dizziness, tremors, focal weakness and headaches.  Endo/Heme/Allergies: Does not bruise/bleed easily.  Psychiatric/Behavioral: Negative for depression. The patient does not have insomnia.     VITAL SIGNS: Blood pressure 140/64, pulse 79, temperature 98.4 F (36.9 C), temperature source Oral, resp. rate 17, height 5\' 5"  (1.651 m), weight 85.5 kg (188 lb 7.9 oz), SpO2 97 %.  PHYSICAL EXAMINATION:   GENERAL:  50 y.o.-year-old patient lying in the bed in mild distress due to discomfort and increased swelling of right face, periorbital area. Periorbital erythema and swelling extending to cheek, jaw and the neck on the right  EYES: Pupils equal, round, reactive to light and accommodation. No scleral icterus. Extraocular muscles intact. Purulent ocular discharge, significant swelling of upper and lower right lid. Patient does have an abscess formation on the cheek on the right with fluctuation subcutaneously with yellow subcutaneous fluid collection, swelling is extending to right cheek as well as neck, submandibular area, significant tenderness to palpation, increased warmth HEENT: Head atraumatic, normocephalic. Oropharynx and nasopharynx clear.  NECK:  Supple, no jugular venous distention. No thyroid enlargement, no tenderness.  LUNGS: Good air entrance bilaterally, no wheezing, rales,rhonchi or crepitation. No use of accessory muscles of respiration.  CARDIOVASCULAR: S1, S2 normal. No murmurs, rubs, or gallops.  ABDOMEN: Soft, nontender, nondistended. Bowel sounds present. No organomegaly or mass.  EXTREMITIES: No pedal edema, cyanosis, or clubbing. Left lower extremity pedal callus with some ulceration was noted in the forefoot, no discharge. No purulence or pain on palpation NEUROLOGIC: Cranial nerves II through XII are intact. Muscle strength 5/5 in all extremities. Sensation intact. Gait not checked.  PSYCHIATRIC: The patient is alert and oriented x 3.  SKIN: No  obvious rash,  lesion, or ulcer.   ORDERS/RESULTS REVIEWED:   CBC  Recent Labs Lab 07/12/16 1834 07/13/16 0526  WBC 8.1 6.3  HGB 11.7* 10.2*  HCT 34.2* 29.8*  PLT 159 121*  MCV 92.7 92.7  MCH 31.6 31.7  MCHC 34.1 34.2  RDW 15.0* 15.2*   ------------------------------------------------------------------------------------------------------------------  Chemistries   Recent Labs Lab 07/12/16 1834 07/13/16 0526  NA 131* 133*  K 4.4 4.4  CL 99* 103  CO2 20* 22  GLUCOSE 155* 212*  BUN 45* 49*  CREATININE 4.88* 5.40*  CALCIUM 8.7* 8.2*   ------------------------------------------------------------------------------------------------------------------ estimated creatinine clearance is 13.3 mL/min (by C-G formula based on SCr of 5.4 mg/dL). ------------------------------------------------------------------------------------------------------------------ No results for input(s): TSH, T4TOTAL, T3FREE, THYROIDAB in the last 72 hours.  Invalid input(s): FREET3  Cardiac Enzymes  Recent Labs Lab 07/12/16 1834 07/12/16 2131  TROPONINI 0.03* <0.03   ------------------------------------------------------------------------------------------------------------------ Invalid input(s): POCBNP ---------------------------------------------------------------------------------------------------------------  RADIOLOGY: No results found.  EKG:  Orders placed or performed during the hospital encounter of 07/12/16  . ED EKG within 10 minutes  . ED EKG within 10 minutes  . EKG 12-Lead  . EKG 12-Lead    ASSESSMENT AND PLAN:  Principal Problem:   Orbital cellulitis on right #1. Periorbital and possibly post-septal cellulitis, Facial abscess, status post incision and drainage 27th of July 2017 by Dr. Pryor Ochoa, continue Intravenous Vancomycin, Resume Zosyn, the patient was seen by ENT and ophthalmologist, no sinus disease was noted, suspected localized disease,  Microbiology is negative  so far, intraoperative cultures were taking including fungal, awaiting for culture results, adjust antibiotics depending on culture results #2. Left foot ulceration do to prior injury, patient was seen by Santiago Glad, wound care specialist, recommended debridement by podiatrist, wound care follow-up as outpatient, supportive therapy for now #3. Elevated troponin, echocardiogram was normal, appreciate cardiology input, continue nitroglycerin topically, continue diltiazem, now on advanced Cardizem doses, continue heparin subcutaneously, aspirin, no further testing was recommended #4 Essential hypertension, advanced Cardizem, , continue nitroglycerin topically , improved blood pressure readings #5 tobacco abuse counselling, d/w patient for 4 minutes yesterday, nicotine replacement therapy offered, agreeable.  Management plans discussed with the patient, family and they are in agreement.   DRUG ALLERGIES:  Allergies  Allergen Reactions  . Cinnamon Anaphylaxis  . Cinoxate Anaphylaxis  . Tylenol [Acetaminophen] Anaphylaxis  . Garlic Hives  . Onion Hives and Swelling    CODE STATUS:     Code Status Orders        Start     Ordered   07/13/16 0309  Full code  Continuous     07/13/16 0308    Code Status History    Date Active Date Inactive Code Status Order ID Comments User Context   06/06/2016 10:13 PM 06/11/2016 12:51 AM Full Code [id number]  Henreitta Leber, MD Inpatient      TOTAL TIME TAKING CARE OF THIS PATIENT:40 minutes.  Discussed with Dr. Vilinda Flake M.D on 07/16/2016 at 12:48 PM  Between 7am to 6pm - Pager - [phone]  After 6pm go to [email or web address] - password [email or web address] [facility]  Office  [phone]  CC: Primary care physician; Tula Nakayama, MD

## 2016-07-16 NOTE — Progress Notes (Signed)
Prime doctor was notified that PRN pain medication was not decreasing her pain in her right eye. New orders to place morphine 2-4 mg Q4 PRN for moderate to severe pain. Will continue to monitor pt.  Angus Seller

## 2016-07-16 NOTE — Care Management Important Message (Signed)
Important Message  Patient Details  Name: ANERI DEBRUHL MRN: [id number] Date of Birth: [birth year]   Medicare Important Message Given:  Yes    Beverly Sessions, RN 07/16/2016, 12:00 PM

## 2016-07-17 ENCOUNTER — Encounter: Payer: Self-pay | Admitting: Otolaryngology

## 2016-07-17 DIAGNOSIS — R079 Chest pain, unspecified: Secondary | ICD-10-CM | POA: Diagnosis not present

## 2016-07-17 LAB — BASIC METABOLIC PANEL
Anion gap: 9 (ref 5–15)
BUN: 40 mg/dL — ABNORMAL HIGH (ref 6–20)
CHLORIDE: 94 mmol/L — AB (ref 101–111)
CO2: 28 mmol/L (ref 22–32)
CREATININE: 4.92 mg/dL — AB (ref 0.44–1.00)
Calcium: 8.2 mg/dL — ABNORMAL LOW (ref 8.9–10.3)
GFR calc non Af Amer: 9 mL/min — ABNORMAL LOW (ref >60)
GFR, EST AFRICAN AMERICAN: 11 mL/min — AB (ref >60)
Glucose, Bld: 162 mg/dL — ABNORMAL HIGH (ref 65–99)
POTASSIUM: 5.1 mmol/L (ref 3.5–5.1)
Sodium: 131 mmol/L — ABNORMAL LOW (ref 135–145)

## 2016-07-17 LAB — GLUCOSE, CAPILLARY
GLUCOSE-CAPILLARY: 281 mg/dL — AB (ref 65–99)
GLUCOSE-CAPILLARY: 199 mg/dL — AB (ref 65–99)
GLUCOSE-CAPILLARY: 107 mg/dL — AB (ref 65–99)
GLUCOSE-CAPILLARY: 344 mg/dL — AB (ref 65–99)
Glucose-Capillary: 250 mg/dL — ABNORMAL HIGH (ref 65–99)

## 2016-07-17 LAB — CBC
HEMATOCRIT: 30.9 % — AB (ref 35.0–47.0)
HEMOGLOBIN: 10.4 g/dL — AB (ref 12.0–16.0)
MCH: 31.3 pg (ref 26.0–34.0)
MCHC: 33.7 g/dL (ref 32.0–36.0)
MCV: 92.9 fL (ref 80.0–100.0)
PLATELETS: 145 10*3/uL — AB (ref 150–440)
RBC: 3.32 MIL/uL — AB (ref 3.80–5.20)
RDW: 14.4 % (ref 11.5–14.5)
WBC: 5 10*3/uL (ref 3.6–11.0)

## 2016-07-17 LAB — PHOSPHORUS: Phosphorus: 5.1 mg/dL — ABNORMAL HIGH (ref 2.5–4.6)

## 2016-07-17 MED ORDER — IBUPROFEN 200 MG PO TABS
200.0000 mg | ORAL_TABLET | Freq: Four times a day (QID) | ORAL | Status: DC | PRN
  Filled 2016-07-17: qty 1

## 2016-07-17 NOTE — Progress Notes (Signed)
[place] at [place] NAME: [name]    MR#:  [id number]  DATE OF BIRTH:  March 22, 1966  SUBJECTIVE:  CHIEF COMPLAINT:   Chief Complaint  Patient presents with  . Facial Swelling  . Chest Pain  The patient is 50 year old female with past history significant for history of congestive heart failure, COPD, COPD, diabetes mellitus, end-stage renal disease, on dialysis, hyperlipidemia, who presents to the hospital with complaints of chest pains right facial swelling and redness. On arrival to the hospital patient was noted to have right facial cellulitis, extending to both septal area. Patient was initiated on broad-spectrum antibiotic therapy with vancomycin and Zosyn, her condition improved, swelling and redness is subsiding, as well as pain. No chest pain today. Echocardiogram was normal.  Patient developed abscess yesterday, requiring incision and drainage by Dr. Pryor Ochoa in operating room, wound cultures are taken, revealing severe gram-negative rods, ID and sensitivities are pending, patient is back on vancomycin and Zosyn. She feels better today, less swelling and pain of right facial area, periorbital area. Patient was seen by ENT earlier today, continue drainage was noted, but improvement of the wound. Review of Systems  Constitutional: Negative for chills, fever and weight loss.  HENT: Negative for congestion.   Eyes: Negative for blurred vision, double vision and discharge.  Respiratory: Negative for cough, sputum production, shortness of breath and wheezing.   Cardiovascular: Negative for chest pain, palpitations, orthopnea, leg swelling and PND.  Gastrointestinal: Negative for abdominal pain, blood in stool, constipation, diarrhea, nausea and vomiting.  Genitourinary: Negative for dysuria, frequency, hematuria and urgency.  Musculoskeletal: Negative for falls.  Neurological: Negative for dizziness, tremors, focal weakness and headaches.   Endo/Heme/Allergies: Does not bruise/bleed easily.  Psychiatric/Behavioral: Negative for depression. The patient does not have insomnia.     VITAL SIGNS: Blood pressure 138/66, pulse 82, temperature 98.1 F (36.7 C), temperature source Oral, resp. rate 20, height 5\' 5"  (1.651 m), weight 85.5 kg (188 lb 7.9 oz), SpO2 95 %.  PHYSICAL EXAMINATION:   GENERAL:  50 y.o.-year-old patient lying in the bed in no distress , some residual swelling of right face, periorbital area was noted, however, no significant erythema   EYES: Pupils equal, round, reactive to light and accommodation. No scleral icterus. Extraocular muscles intact. Dressing displaced on the right cheek, upon opening abscess was noted to be draining purulent discharge, swelling has subsided, no erythema. Some fluctuations still noted on palpation HEENT: Head atraumatic, normocephalic. Oropharynx and nasopharynx clear.  NECK:  Supple, no jugular venous distention. No thyroid enlargement, no tenderness.  LUNGS: Good air entrance bilaterally, no wheezing, rales,rhonchi or crepitation. No use of accessory muscles of respiration.  CARDIOVASCULAR: S1, S2 normal. No murmurs, rubs, or gallops.  ABDOMEN: Soft, nontender, nondistended. Bowel sounds present. No organomegaly or mass.  EXTREMITIES: No pedal edema, cyanosis, or clubbing. Left lower extremity pedal callus with some ulceration was noted in the forefoot, no discharge. No purulence or pain on palpation NEUROLOGIC: Cranial nerves II through XII are intact. Muscle strength 5/5 in all extremities. Sensation intact. Gait not checked.  PSYCHIATRIC: The patient is alert and oriented x 3.  SKIN: No obvious rash, lesion, or ulcer.   ORDERS/RESULTS REVIEWED:   CBC  Recent Labs Lab 07/12/16 1834 07/13/16 0526 07/17/16 0435  WBC 8.1 6.3 5.0  HGB 11.7* 10.2* 10.4*  HCT 34.2* 29.8* 30.9*  PLT 159 121* 145*  MCV 92.7 92.7 92.9  MCH 31.6 31.7 31.3  MCHC  34.1 34.2 33.7  RDW 15.0* 15.2*  14.4   ------------------------------------------------------------------------------------------------------------------  Chemistries   Recent Labs Lab 07/12/16 1834 07/13/16 0526 07/17/16 0435  NA 131* 133* 131*  K 4.4 4.4 5.1  CL 99* 103 94*  CO2 20* 22 28  GLUCOSE 155* 212* 162*  BUN 45* 49* 40*  CREATININE 4.88* 5.40* 4.92*  CALCIUM 8.7* 8.2* 8.2*   ------------------------------------------------------------------------------------------------------------------ estimated creatinine clearance is 14.6 mL/min (by C-G formula based on SCr of 4.92 mg/dL). ------------------------------------------------------------------------------------------------------------------ No results for input(s): TSH, T4TOTAL, T3FREE, THYROIDAB in the last 72 hours.  Invalid input(s): FREET3  Cardiac Enzymes  Recent Labs Lab 07/12/16 1834 07/12/16 2131  TROPONINI 0.03* <0.03   ------------------------------------------------------------------------------------------------------------------ Invalid input(s): POCBNP ---------------------------------------------------------------------------------------------------------------  RADIOLOGY: No results found.  EKG:  Orders placed or performed during the hospital encounter of 07/12/16  . ED EKG within 10 minutes  . ED EKG within 10 minutes  . EKG 12-Lead  . EKG 12-Lead  . EKG 12-Lead  . EKG 12-Lead  . EKG 12-Lead  . EKG 12-Lead  . EKG 12-Lead  . EKG 12-Lead    ASSESSMENT AND PLAN:  Principal Problem:   Orbital cellulitis on right #1. Periorbital and possibly post-septal cellulitis, Facial abscess, status post incision and drainage 27th of July 2017 by Dr. Pryor Ochoa, Gram stain reveals  gram-negative rods, ID and sensitivities are still pending, cultures are growing Staphylococcus aureus, continue Intravenous Vancomycin, Zosyn. Patient is improving after procedure, awaiting for culture results, adjust antibiotics depending on culture  results #2. Left foot ulceration do to prior injury, patient was seen by Santiago Glad, wound care specialist, recommended debridement by podiatrist, wound care follow-up as outpatient, supportive therapy for now #3. Elevated troponin, echocardiogram was normal, appreciate cardiology input, continue nitroglycerin topically, continue diltiazem, now on advanced Cardizem doses, continue heparin subcutaneously, aspirin, no further testing was recommended #4 Essential hypertension, better blood pressure control on advanced Cardizem,  continue nitroglycerin topically while in the hospital #5 tobacco abuse counselling, d/w patient for 4 minutes yesterday, nicotine replacement therapy offered, agreeable.  #6 End stage renal disease, dialysis per schedule, appreciate nephrologist input  Management plans discussed with the patient, family and they are in agreement.   DRUG ALLERGIES:  Allergies  Allergen Reactions  . Cinnamon Anaphylaxis  . Cinoxate Anaphylaxis  . Tylenol [Acetaminophen] Anaphylaxis  . Garlic Hives  . Onion Hives and Swelling    CODE STATUS:     Code Status Orders        Start     Ordered   07/13/16 0309  Full code  Continuous     07/13/16 0308    Code Status History    Date Active Date Inactive Code Status Order ID Comments User Context   06/06/2016 10:13 PM 06/11/2016 12:51 AM Full Code [id number]  Henreitta Leber, MD Inpatient      TOTAL TIME TAKING CARE OF THIS PATIENT:30 minutes.    [name] M.D on 07/17/2016 at 1:09 PM  Between 7am to 6pm - Pager - [phone]  After 6pm go to [email or web address] - password [email or web address] [facility]  Office  [phone]  CC: Primary care physician; Tula Nakayama, MD

## 2016-07-17 NOTE — Progress Notes (Signed)
HD tx end  

## 2016-07-17 NOTE — Progress Notes (Signed)
POST HD VITALS

## 2016-07-17 NOTE — Progress Notes (Signed)
HD POST ASSESSMENT

## 2016-07-17 NOTE — Progress Notes (Signed)
Pre HD  

## 2016-07-17 NOTE — Progress Notes (Signed)
.. 07/17/2016 8:02 AM  [name] [id number]  Post-Op Day 1    Temp:  [98 F (36.7 C)-98.4 F (36.9 C)] 98.1 F (36.7 C) (07/28 0359) Pulse Rate:  [79-82] 82 (07/28 0359) Resp:  [17-21] 20 (07/28 0359) BP: (128-140)/(59-69) 138/66 (07/28 0359) SpO2:  [95 %-99 %] 95 % (07/28 0359),     Intake/Output Summary (Last 24 hours) at 07/17/16 0802 Last data filed at 07/17/16 0200  Gross per 24 hour  Intake              933 ml  Output              625 ml  Net              308 ml    Results for orders placed or performed during the hospital encounter of 07/12/16 (from the past 24 hour(s))  Anaerobic culture     Status: None (Preliminary result)   Collection Time: 07/16/16 11:29 AM  Result Value Ref Range   Specimen Description      ABSCESS RIGHT FACE Performed at [facility]    Special Requests NONE    Gram Stain PENDING    Culture PENDING    Report Status PENDING   Aerobic Culture (superficial specimen)     Status: None (Preliminary result)   Collection Time: 07/16/16 11:29 AM  Result Value Ref Range   Specimen Description ABSCESS RIGHT FACE    Special Requests NONE    Gram Stain      RARE WBC PRESENT, PREDOMINANTLY PMN RARE GRAM NEGATIVE RODS Performed at [facility]    Culture PENDING    Report Status PENDING   Glucose, capillary     Status: Abnormal   Collection Time: 07/16/16 11:52 AM  Result Value Ref Range   Glucose-Capillary 123 (H) 65 - 99 mg/dL   Comment 1 Notify RN   Glucose, capillary     Status: None   Collection Time: 07/16/16  4:44 PM  Result Value Ref Range   Glucose-Capillary 78 65 - 99 mg/dL   Comment 1 Notify RN   Glucose, capillary     Status: Abnormal   Collection Time: 07/16/16  9:21 PM  Result Value Ref Range   Glucose-Capillary 119 (H) 65 - 99 mg/dL  Basic metabolic panel     Status: Abnormal   Collection Time: 07/17/16  4:35 AM  Result Value Ref Range   Sodium 131 (L) 135 - 145 mmol/L   Potassium 5.1 3.5 - 5.1  mmol/L   Chloride 94 (L) 101 - 111 mmol/L   CO2 28 22 - 32 mmol/L   Glucose, Bld 162 (H) 65 - 99 mg/dL   BUN 40 (H) 6 - 20 mg/dL   Creatinine, Ser 4.92 (H) 0.44 - 1.00 mg/dL   Calcium 8.2 (L) 8.9 - 10.3 mg/dL   GFR calc non Af Amer 9 (L) >60 mL/min   GFR calc Af Amer 11 (L) >60 mL/min   Anion gap 9 5 - 15  CBC     Status: Abnormal   Collection Time: 07/17/16  4:35 AM  Result Value Ref Range   WBC 5.0 3.6 - 11.0 K/uL   RBC 3.32 (L) 3.80 - 5.20 MIL/uL   Hemoglobin 10.4 (L) 12.0 - 16.0 g/dL   HCT 30.9 (L) 35.0 - 47.0 %   MCV 92.9 80.0 - 100.0 fL   MCH 31.3 26.0 - 34.0 pg   MCHC 33.7 32.0 - 36.0 g/dL  RDW 14.4 11.5 - 14.5 %   Platelets 145 (L) 150 - 440 K/uL  Glucose, capillary     Status: Abnormal   Collection Time: 07/17/16  7:51 AM  Result Value Ref Range   Glucose-Capillary 250 (H) 65 - 99 mg/dL    SUBJECTIVE:  No acute events.  Reports improved pain this a.m.  Reports can see better out of right eye.  OBJECTIVE:  GEN-  NAD FACE-  Dressing changed and packing removed.  Continued drainage with purulence.  Improved erythema and edema and induration  IMPRESSION:  S/p I&D of right facial abscess  PLAN:  Gram stain with rare Gram negative rods.  Follow culture results.  Continue prn dressing changes.  [name] 07/17/2016, 8:02 AM

## 2016-07-17 NOTE — Progress Notes (Signed)
Pharmacy Antibiotic Note  [name] is a 50 y.o. female admitted on 07/12/2016 with facial abscess.  Pharmacy has been consulted for piperacillin/tazobactam & vancomycin dosing.  This is day #6 of antibiotics  Plan: Continue vancomycin 1000 mg IV with HD on MWF. Vancomycin trough 7/31 with third HD session. Goal trough 15-25 mcg/ml.  Continue piperacillin/tazobactam 3.375 g IV q12h EI  Height: 5\' 5"  (165.1 cm) Weight: 188 lb 7.9 oz (85.5 kg) IBW/kg (Calculated) : 57  Temp (24hrs), Avg:98 F (36.7 C), Min:97.8 F (36.6 C), Max:98.1 F (36.7 C)   Recent Labs Lab 07/12/16 1834 07/13/16 0526 07/15/16 1245 07/17/16 0435  WBC 8.1 6.3  --  5.0  CREATININE 4.88* 5.40*  --  4.92*  VANCOTROUGH  --   --  13*  --     Estimated Creatinine Clearance: 14.6 mL/min (by C-G formula based on SCr of 4.92 mg/dL).    Allergies  Allergen Reactions  . Cinnamon Anaphylaxis  . Cinoxate Anaphylaxis  . Tylenol [Acetaminophen] Anaphylaxis  . Garlic Hives  . Onion Hives and Swelling   Antimicrobials this admission: vancomycin 7/23 >>  Piperacillin/tazobactam 7/24 >> 7/26 and resumed on 7/27 >>  Dose adjustments this admission:   Thank you for allowing pharmacy to be a part of this patient's care.  Ulice Dash D, PharmD, BCPS 07/17/2016 2:20 PM

## 2016-07-17 NOTE — Progress Notes (Signed)
[facility]  ROUNDING NOTE   Subjective:  Patient had I&D of right sided facial abscess yesterday. 7 cc of purulent material was recovered. Erythema is significantly less today. Patient due for hemodialysis today.   Objective:  Vital signs in last 24 hours:  Temp:  [98 F (36.7 C)-98.4 F (36.9 C)] 98.1 F (36.7 C) (07/28 0359) Pulse Rate:  [79-82] 82 (07/28 0359) Resp:  [17-21] 20 (07/28 0359) BP: (128-140)/(59-69) 138/66 (07/28 0359) SpO2:  [95 %-99 %] 95 % (07/28 0359)  Weight change:  Filed Weights   07/13/16 2007 07/13/16 2037 07/15/16 1530  Weight: 88.1 kg (194 lb 3.6 oz) 87.3 kg (192 lb 6.4 oz) 85.5 kg (188 lb 7.9 oz)    Intake/Output: I/O last 3 completed shifts: In: [id number] [P.O.:958; IV Piggyback:93] Out: [id number] [Urine:825]   Intake/Output this shift:  No intake/output data recorded.  Physical Exam: General: No acute distress, sitting up in bed  Head: Normocephalic, atraumatic. Moist oral mucosal membranes  Eyes: Status post incision and drainage beneath the right eye. Decreased erythema surrounding the right eye.   Neck: Supple, trachea midline  Lungs:  Clear to auscultation, normal effort  Heart: S1S2 no rubs  Abdomen:  Soft, nontender, bowel sounds present  Extremities: Trace peripheral edema.  Neurologic: Nonfocal, moving all four extremities  Skin: No lesions  Access: Left upper extremity AV fistula    Basic Metabolic Panel:  Recent Labs Lab 07/12/16 1834 07/13/16 0526 07/17/16 0435  NA 131* 133* 131*  K 4.4 4.4 5.1  CL 99* 103 94*  CO2 20* 22 28  GLUCOSE 155* 212* 162*  BUN 45* 49* 40*  CREATININE 4.88* 5.40* 4.92*  CALCIUM 8.7* 8.2* 8.2*  PHOS  --  4.3  --     Liver Function Tests: No results for input(s): AST, ALT, ALKPHOS, BILITOT, PROT, ALBUMIN in the last 168 hours. No results for input(s): LIPASE, AMYLASE in the last 168 hours. No results for input(s): AMMONIA in the last 168 hours.  CBC:  Recent Labs Lab  07/12/16 1834 07/13/16 0526 07/17/16 0435  WBC 8.1 6.3 5.0  HGB 11.7* 10.2* 10.4*  HCT 34.2* 29.8* 30.9*  MCV 92.7 92.7 92.9  PLT 159 121* 145*    Cardiac Enzymes:  Recent Labs Lab 07/12/16 1834 07/12/16 2131  TROPONINI 0.03* <0.03    BNP: Invalid input(s): POCBNP  CBG:  Recent Labs Lab 07/16/16 1152 07/16/16 1644 07/16/16 2121 07/17/16 0751 07/17/16 0951  GLUCAP 123* 78 119* 250* 281*    Microbiology: Results for orders placed or performed during the hospital encounter of 07/12/16  MRSA PCR Screening     Status: None   Collection Time: 07/13/16  5:26 AM  Result Value Ref Range Status   MRSA by PCR NEGATIVE NEGATIVE Final    Comment:        The GeneXpert MRSA Assay (FDA approved for NASAL specimens only), is one component of a comprehensive MRSA colonization surveillance program. It is not intended to diagnose MRSA infection nor to guide or monitor treatment for MRSA infections.   Aerobic Culture (superficial specimen)     Status: None   Collection Time: 07/14/16  6:28 PM  Result Value Ref Range Status   Specimen Description FACE  Final   Special Requests NONE  Final   Gram Stain   Final    RARE WBC PRESENT, PREDOMINANTLY PMN NO ORGANISMS SEEN    Culture   Final    NO GROWTH 2 DAYS Performed at [facility]    Report Status 07/16/2016 FINAL  Final  Anaerobic culture     Status: None (Preliminary result)   Collection Time: 07/16/16 11:29 AM  Result Value Ref Range Status   Specimen Description   Final    ABSCESS RIGHT FACE Performed at [facility]    Special Requests NONE  Final   Gram Stain PENDING  Incomplete   Culture PENDING  Incomplete   Report Status PENDING  Incomplete  Aerobic Culture (superficial specimen)     Status: None (Preliminary result)   Collection Time: 07/16/16 11:29 AM  Result Value Ref Range Status   Specimen Description ABSCESS RIGHT FACE  Final   Special Requests NONE  Final   Gram Stain   Final     RARE WBC PRESENT, PREDOMINANTLY PMN RARE GRAM NEGATIVE RODS Performed at [facility]    Culture PENDING  Incomplete   Report Status PENDING  Incomplete    Coagulation Studies: No results for input(s): LABPROT, INR in the last 72 hours.  Urinalysis: No results for input(s): COLORURINE, LABSPEC, PHURINE, GLUCOSEU, HGBUR, BILIRUBINUR, KETONESUR, PROTEINUR, UROBILINOGEN, NITRITE, LEUKOCYTESUR in the last 72 hours.  Invalid input(s): APPERANCEUR    Imaging: No results found.   Medications:     . aspirin EC  325 mg Oral Daily  . diltiazem  180 mg Oral BID  . docusate sodium  100 mg Oral BID  . epoetin (EPOGEN/PROCRIT) injection  4,000 Units Intravenous Q M,W,F-HD  . heparin  5,000 Units Subcutaneous Q8H  . hydrALAZINE  100 mg Oral BID  . insulin aspart  0-5 Units Subcutaneous QHS  . insulin aspart  0-9 Units Subcutaneous TID WC  . insulin aspart  4 Units Subcutaneous TID WC  . insulin glargine  15 Units Subcutaneous QHS  . multivitamin  1 tablet Oral QHS  . nitroGLYCERIN  1 inch Topical Q6H  . pantoprazole  40 mg Oral Q breakfast  . piperacillin-tazobactam (ZOSYN)  IV  3.375 g Intravenous Q12H  . sevelamer carbonate  1,600 mg Oral TID WC  . sodium chloride flush  3 mL Intravenous Q12H  . vancomycin  1,000 mg Intravenous Q M,W,F-HD  . venlafaxine XR  150 mg Oral Daily   sodium chloride, ALPRAZolam, diphenhydrAMINE, menthol-cetylpyridinium, morphine injection, nicotine polacrilex, nitroGLYCERIN, ondansetron **OR** ondansetron (ZOFRAN) IV, oxyCODONE, senna-docusate, sodium chloride flush  Assessment/ Plan:  50 y.o. female  with medical history of hypertension, diastolic heart failure, diabetes mellitus type 2, and anemia of chronic kidney disease, hepatitis C, depression, psoriasis, peripheral neuropathy, ESRD, admission for left foot blister/LLE cellulitis, right periorbital cellulitis  [facility] MWF  1.  End-stage renal disease on dialysis MWF. -  Patient due for hemodialysis today. Orders have been prepared.  2. Anemia chronic and disease: Hemoglobin 10.4 today. Continue Epogen 4000 units IV with dialysis.  3. Secondary hyperparathyroidism. -   recheck phosphorus today. Continue Renvela for now.  4. Hypertension. Blood pressure currently 138/66. - Continue diltiazem and hydralazine.  5. Right periorbital cellulitis with right-sided facial abscess status post incision and drainage 07/16/2016. Erythema significantly improved after drainage of right-sided facial abscess. Aerobic, anaerobic, and fungal cultures were taken. Continue Zosyn and vancomycin for now.  LOS: 4 [name] 7/28/201710:18 AM

## 2016-07-17 NOTE — Progress Notes (Signed)
Start of hd tx  

## 2016-07-17 NOTE — Progress Notes (Signed)
PRE HD ASSESSMENT 

## 2016-07-18 DIAGNOSIS — L851 Acquired keratosis [keratoderma] palmaris et plantaris: Secondary | ICD-10-CM

## 2016-07-18 DIAGNOSIS — Z8614 Personal history of Methicillin resistant Staphylococcus aureus infection: Secondary | ICD-10-CM

## 2016-07-18 DIAGNOSIS — I10 Essential (primary) hypertension: Secondary | ICD-10-CM

## 2016-07-18 DIAGNOSIS — E1159 Type 2 diabetes mellitus with other circulatory complications: Secondary | ICD-10-CM

## 2016-07-18 DIAGNOSIS — I152 Hypertension secondary to endocrine disorders: Secondary | ICD-10-CM

## 2016-07-18 DIAGNOSIS — Z87891 Personal history of nicotine dependence: Secondary | ICD-10-CM

## 2016-07-18 DIAGNOSIS — F17219 Nicotine dependence, cigarettes, with unspecified nicotine-induced disorders: Secondary | ICD-10-CM

## 2016-07-18 DIAGNOSIS — L97529 Non-pressure chronic ulcer of other part of left foot with unspecified severity: Secondary | ICD-10-CM

## 2016-07-18 DIAGNOSIS — Z992 Dependence on renal dialysis: Secondary | ICD-10-CM

## 2016-07-18 DIAGNOSIS — N186 End stage renal disease: Secondary | ICD-10-CM

## 2016-07-18 DIAGNOSIS — Z720 Tobacco use: Secondary | ICD-10-CM

## 2016-07-18 LAB — GLUCOSE, CAPILLARY
GLUCOSE-CAPILLARY: 173 mg/dL — AB (ref 65–99)
Glucose-Capillary: 103 mg/dL — ABNORMAL HIGH (ref 65–99)
Glucose-Capillary: 322 mg/dL — ABNORMAL HIGH (ref 65–99)

## 2016-07-18 MED ORDER — MENTHOL 3 MG MT LOZG: 1.0000 | LOZENGE | OROMUCOSAL | 12 refills | Status: DC | PRN

## 2016-07-18 MED ORDER — NICOTINE POLACRILEX 2 MG MT GUM: 2.0000 mg | CHEWING_GUM | OROMUCOSAL | 0 refills | Status: DC | PRN

## 2016-07-18 MED ORDER — SALINE SPRAY 0.65 % NA SOLN
1.0000 | NASAL | Status: DC | PRN
  Filled 2016-07-18: qty 44

## 2016-07-18 MED ORDER — SALINE SPRAY 0.65 % NA SOLN: 1.0000 | NASAL | 5 refills | Status: DC | PRN

## 2016-07-18 MED ORDER — DILTIAZEM HCL ER 90 MG PO CP12: 180.0000 mg | ORAL_CAPSULE | Freq: Two times a day (BID) | ORAL | 6 refills | Status: DC

## 2016-07-18 MED ORDER — DOCUSATE SODIUM 100 MG PO CAPS: 100.0000 mg | ORAL_CAPSULE | Freq: Two times a day (BID) | ORAL | 0 refills | Status: DC

## 2016-07-18 MED ORDER — SULFAMETHOXAZOLE-TRIMETHOPRIM 800-160 MG PO TABS: 1.0000 | ORAL_TABLET | Freq: Every day | ORAL | 0 refills | Status: DC

## 2016-07-18 MED ORDER — GENTAMICIN SULFATE 0.1 % EX OINT: TOPICAL_OINTMENT | Freq: Four times a day (QID) | CUTANEOUS | 0 refills | Status: DC

## 2016-07-18 MED ORDER — SULFAMETHOXAZOLE-TRIMETHOPRIM 800-160 MG PO TABS: 1.0000 | ORAL_TABLET | Freq: Two times a day (BID) | ORAL | Status: DC

## 2016-07-18 MED ORDER — SULFAMETHOXAZOLE-TRIMETHOPRIM 800-160 MG PO TABS
1.0000 | ORAL_TABLET | Freq: Every day | ORAL | Status: DC
  Administered 2016-07-18: 1 via ORAL
  Filled 2016-07-18: qty 1

## 2016-07-18 MED ORDER — GENTAMICIN SULFATE 0.1 % EX OINT
TOPICAL_OINTMENT | Freq: Four times a day (QID) | CUTANEOUS | Status: DC
  Filled 2016-07-18: qty 15

## 2016-07-18 NOTE — Progress Notes (Signed)
Pt c/o chest discomfort/ requesting nitro/ relieved with one nitro tablet/ no distress/ MD paged to make aware/ will continue to monitor.

## 2016-07-18 NOTE — Discharge Summary (Signed)
[place] [facility] [place] NAME: [name]    MR#:  [id number]  DATE OF BIRTH:  1966/01/18  DATE OF ADMISSION:  07/12/2016 ADMITTING PHYSICIAN: Saundra Shelling, MD  DATE OF DISCHARGE: No discharge date for patient encounter.  PRIMARY CARE PHYSICIAN: Tula Nakayama, MD     ADMISSION DIAGNOSIS:  Cellulitis of face [L03.211] Erythema [L53.9] Swelling of eye, right [H57.8] Orbital cellulitis, right [H05.011] Facial cellulitis [id number] Preseptal cellulitis, right [H00.033] Acute right eye pain [H57.11]  DISCHARGE DIAGNOSIS:  Principal Problem:   Orbital cellulitis on right Active Problems:   MRSA (methicillin resistant Staphylococcus aureus) infection   Status post incision and drainage   Elevated troponin   Essential hypertension   Tobacco abuse   Chronic ulcer of left foot (HCC)   Acquired palmar and plantar hyperkeratosis   Epistaxis   ESRD on dialysis ([place])   SECONDARY DIAGNOSIS:   Past Medical History:  Diagnosis Date  . Anemia   . Anxiety   . Arthritis   . CHF (congestive heart failure) ([place])   . Chronic kidney disease   . COPD (chronic obstructive pulmonary disease) ([place])   . Depression   . Diabetes mellitus without complication ([place])   . Emphysema of lung ([place])   . End stage renal disease ([place])   . Hepatitis   . Hypercholesterolemia   . Hypertension   . Tobacco dependence     .pro HOSPITAL COURSE:  The patient is 50 year old female with past history significant for history of congestive heart failure, COPD, COPD, diabetes mellitus, end-stage renal disease, on dialysis, hyperlipidemia, who presents to the hospital with complaints of chest pains,  right facial swelling and redness. On arrival to the hospital patient was noted to have right facial cellulitis, possibly extending to postseptal area of right eye. Patient was initiated on broad-spectrum antibiotic therapy with vancomycin and Zosyn, her condition  improved, swelling and redness is subsided, as well as pain. Patient was seen by ENT physicians, they tried to obtain purulent discharge from facial wound, in order to culture, however, with manipulation the  Patient developed facial abscess, requiring incision and drainage by Dr. Pryor Ochoa in the operating room, wound culture revealed MRSA. Dr. Pryor Ochoa followed patient in the hospital, he felt that her wound was improving, incision and drainage site continued to be open and draining, he felt there was no need for further surgical intervention, he recommended to continue vancomycin intravenously, also gentamicin ointment on nose as well as on the wound. He recommended to continue as needed dressing of the wound. This was Indicated with Dr. Holley Raring, who is going to arrange vancomycin as outpatient to be delivered after hemodialysis. It is recommended to continue antibiotics for at least 10 more days or longer depending on clinical improvement. In regards to chest pains, patient had intermittent chest pains while in the hospital, she was seen by Dr. Ubaldo Glassing, cardiologist, who felt that it was atypical pain, unlikely ischemic, patient ruled out for MI, her electrocardiogram was normal, no further intervention was recommended, an echocardiogram was performed, it was normal.  Discussion by problem: #1. Periorbital and possibly post-septal cellulitis, facial abscess, status post incision and drainage 27th of July 2017 by Dr. Pryor Ochoa, wound culture revealed MRSA, patient was advised to continue Intravenous Vancomycin with dialysis, discussed with nephrologist who is going to arrange vancomycin as outpatient to be continued for 10 days or longer depending on patient's clinical progress. The patient has improved significantly after procedure, she  was followed by ENT physician, who felt that patient's wound is improving as well. #2. Left foot ulceration dew to prior injury, patient was seen by Santiago Glad, wound care specialist,  recommended debridement by podiatrist, wound care follow-up as outpatient, supportive therapy for now #3. Elevated troponin, echocardiogram was normal, the patient was seen by cardiologist, no further interventions were recommended, continue diltiazem, now on advanced Cardizem doses. Patient continued to have some chest pains while in the hospital, however, those were atypical, likely musculoskeletal, supportive therapy was recommended, when necessary Advil.  #4 Essential hypertension, better blood pressure control on advanced Cardizem. Follow-up with primary care physician and advanced medications even higher if needed  #5 tobacco abuse counselling, d/w patient for 4 minutes before, nicotine replacement therapy offered, agreeable.  #6 End stage renal disease, dialysis per schedule, appreciate nephrologist input DISCHARGE CONDITIONS:   Stable  CONSULTS OBTAINED:  Treatment Team:  Teodoro Spray, MD Munsoor Holley Raring, MD Margaretha Sheffield, MD Birder Robson, MD  DRUG ALLERGIES:   Allergies  Allergen Reactions  . Cinnamon Anaphylaxis  . Cinoxate Anaphylaxis  . Tylenol [Acetaminophen] Anaphylaxis  . Garlic Hives  . Onion Hives and Swelling    DISCHARGE MEDICATIONS:   Current Discharge Medication List    START taking these medications   Details  docusate sodium (COLACE) 100 MG capsule Take 1 capsule (100 mg total) by mouth 2 (two) times daily. Qty: 10 capsule, Refills: 0    gentamicin ointment (GARAMYCIN) 0.1 % Apply topically 4 (four) times daily. Qty: 15 g, Refills: 0    menthol-cetylpyridinium (CEPACOL) 3 MG lozenge Take 1 lozenge (3 mg total) by mouth as needed for sore throat. Qty: 100 tablet, Refills: 12    nicotine polacrilex (NICORETTE) 2 MG gum Take 1 each (2 mg total) by mouth as needed for smoking cessation. Qty: 100 tablet, Refills: 0    sodium chloride (OCEAN) 0.65 % SOLN nasal spray Place 1 spray into both nostrils as needed for congestion. Qty: 1 Bottle, Refills: 5     traMADol (ULTRAM) 50 MG tablet Take 1 tablet (50 mg total) by mouth every 6 (six) hours as needed for severe pain. Qty: 20 tablet, Refills: 0      CONTINUE these medications which have CHANGED   Details  diltiazem (CARDIZEM SR) 90 MG 12 hr capsule Take 2 capsules (180 mg total) by mouth 2 (two) times daily. Qty: 60 capsule, Refills: 6      CONTINUE these medications which have NOT CHANGED   Details  b complex-vitamin c-folic acid (NEPHRO-VITE) 0.8 MG TABS tablet Take 1 tablet by mouth daily.    clobetasol ointment (TEMOVATE) [id number] % Apply 1 application topically daily.    hydrALAZINE (APRESOLINE) 100 MG tablet Take 100 mg by mouth 2 (two) times daily.    insulin glargine (LANTUS) 100 UNIT/ML injection Inject 15 Units into the skin at bedtime.     insulin regular (NOVOLIN R,HUMULIN R) 100 units/mL injection Inject 5-10 Units into the skin 3 (three) times daily before meals. Pt uses per sliding scale.    omeprazole (PRILOSEC) 40 MG capsule Take 40 mg by mouth daily.    Oxycodone HCl 10 MG TABS Take 10 mg by mouth 4 (four) times daily as needed (for pain).    sevelamer carbonate (RENVELA) 800 MG tablet Take 1,600 mg by mouth 3 (three) times daily with meals.    venlafaxine XR (EFFEXOR-XR) 75 MG 24 hr capsule Take 150 mg by mouth daily.  DISCHARGE INSTRUCTIONS:    Patient is to follow-up with primary care physician, nephrologist as outpatient. She is to receive vancomycin intravenously with dialysis until infection subsides. Patient is to follow up with wound care center, she would benefit from podiatrist evaluation  If you experience worsening of your admission symptoms, develop shortness of breath, life threatening emergency, suicidal or homicidal thoughts you must seek medical attention immediately by calling 911 or calling your MD immediately  if symptoms less severe.  You Must read complete instructions/literature along with all the possible adverse reactions/side  effects for all the Medicines you take and that have been prescribed to you. Take any new Medicines after you have completely understood and accept all the possible adverse reactions/side effects.   Please note  You were cared for by a hospitalist during your hospital stay. If you have any questions about your discharge medications or the care you received while you were in the hospital after you are discharged, you can call the unit and asked to speak with the hospitalist on call if the hospitalist that took care of you is not available. Once you are discharged, your primary care physician will handle any further medical issues. Please note that NO REFILLS for any discharge medications will be authorized once you are discharged, as it is imperative that you return to your primary care physician (or establish a relationship with a primary care physician if you do not have one) for your aftercare needs so that they can reassess your need for medications and monitor your lab values.    Today   CHIEF COMPLAINT:   Chief Complaint  Patient presents with  . Facial Swelling  . Chest Pain    HISTORY OF PRESENT ILLNESS:  [name]  is a 50 y.o. female with a known history of congestive heart failure, COPD, COPD, diabetes mellitus, end-stage renal disease, on dialysis, hyperlipidemia, who presents to the hospital with complaints of chest pains,  right facial swelling and redness. On arrival to the hospital patient was noted to have right facial cellulitis, possibly extending to postseptal area of right eye. Patient was initiated on broad-spectrum antibiotic therapy with vancomycin and Zosyn, her condition improved, swelling and redness is subsided, as well as pain. Patient was seen by ENT physicians, they tried to obtain purulent discharge from facial wound, in order to culture, however, with manipulation the  Patient developed facial abscess, requiring incision and drainage by Dr. Pryor Ochoa in the operating  room, wound culture revealed MRSA. Dr. Pryor Ochoa followed patient in the hospital, he felt that her wound was improving, incision and drainage site continued to be open and draining, he felt there was no need for further surgical intervention, he recommended to continue vancomycin intravenously, also gentamicin ointment on nose as well as on the wound. He recommended to continue as needed dressing of the wound. This was Indicated with Dr. Holley Raring, who is going to arrange vancomycin as outpatient to be delivered after hemodialysis. It is recommended to continue antibiotics for at least 10 more days or longer depending on clinical improvement. In regards to chest pains, patient had intermittent chest pains while in the hospital, she was seen by Dr. Ubaldo Glassing, cardiologist, who felt that it was atypical pain, unlikely ischemic, patient ruled out for MI, her electrocardiogram was normal, no further intervention was recommended, an echocardiogram was performed, it was normal.  Discussion by problem: #1. Periorbital and possibly post-septal cellulitis, facial abscess, status post incision and drainage 27th of July 2017 by Dr.  Vaught, wound culture revealed MRSA, patient was advised to continue Intravenous Vancomycin with dialysis, discussed with nephrologist who is going to arrange vancomycin as outpatient to be continued for 10 days or longer depending on patient's clinical progress. The patient has improved significantly after procedure, she was followed by ENT physician, who felt that patient's wound is improving as well. #2. Left foot ulceration dew to prior injury, patient was seen by Santiago Glad, wound care specialist, recommended debridement by podiatrist, wound care follow-up as outpatient, supportive therapy for now #3. Elevated troponin, echocardiogram was normal, the patient was seen by cardiologist, no further interventions were recommended, continue diltiazem, now on advanced Cardizem doses. Patient continued to have  some chest pains while in the hospital, however, those were atypical, likely musculoskeletal, supportive therapy was recommended, when necessary Advil.  #4 Essential hypertension, better blood pressure control on advanced Cardizem. Follow-up with primary care physician and advanced medications even higher if needed  #5 tobacco abuse counselling, d/w patient for 4 minutes before, nicotine replacement therapy offered, agreeable.  #6 End stage renal disease, dialysis per schedule, appreciate nephrologist input   VITAL SIGNS:  Blood pressure 139/68, pulse 81, temperature 98.3 F (36.8 C), temperature source Oral, resp. rate 20, height 5\' 5"  (1.651 m), weight 88.1 kg (194 lb 3.6 oz), SpO2 99 %.  I/O:   Intake/Output Summary (Last 24 hours) at 07/18/16 1358 Last data filed at 07/17/16 1806  Gross per 24 hour  Intake                0 ml  Output             1300 ml  Net            -1300 ml    PHYSICAL EXAMINATION:  GENERAL:  50 y.o.-year-old patient lying in the bed with no acute distress.  EYES: Pupils equal, round, reactive to light and accommodation. No scleral icterus. Extraocular muscles intact.  HEENT: Head atraumatic, normocephalic. Oropharynx and nasopharynx clear.  NECK:  Supple, no jugular venous distention. No thyroid enlargement, no tenderness.  LUNGS: Normal breath sounds bilaterally, no wheezing, rales,rhonchi or crepitation. No use of accessory muscles of respiration.  CARDIOVASCULAR: S1, S2 normal. No murmurs, rubs, or gallops.  ABDOMEN: Soft, non-tender, non-distended. Bowel sounds present. No organomegaly or mass.  EXTREMITIES: No pedal edema, cyanosis, or clubbing.  NEUROLOGIC: Cranial nerves II through XII are intact. Muscle strength 5/5 in all extremities. Sensation intact. Gait not checked.  PSYCHIATRIC: The patient is alert and oriented x 3.  SKIN: No obvious rash, lesion, or ulcer.   DATA REVIEW:   CBC  Recent Labs Lab 07/17/16 0435  WBC 5.0  HGB 10.4*  HCT  30.9*  PLT 145*    Chemistries   Recent Labs Lab 07/17/16 0435  NA 131*  K 5.1  CL 94*  CO2 28  GLUCOSE 162*  BUN 40*  CREATININE 4.92*  CALCIUM 8.2*    Cardiac Enzymes  Recent Labs Lab 07/12/16 2131  TROPONINI <0.03    Microbiology Results  Results for orders placed or performed during the hospital encounter of 07/12/16  MRSA PCR Screening     Status: None   Collection Time: 07/13/16  5:26 AM  Result Value Ref Range Status   MRSA by PCR NEGATIVE NEGATIVE Final    Comment:        The GeneXpert MRSA Assay (FDA approved for NASAL specimens only), is one component of a comprehensive MRSA colonization surveillance program. It is not  intended to diagnose MRSA infection nor to guide or monitor treatment for MRSA infections.   Aerobic Culture (superficial specimen)     Status: None   Collection Time: 07/14/16  6:28 PM  Result Value Ref Range Status   Specimen Description FACE  Final   Special Requests NONE  Final   Gram Stain   Final    RARE WBC PRESENT, PREDOMINANTLY PMN NO ORGANISMS SEEN    Culture   Final    NO GROWTH 2 DAYS Performed at [facility]    Report Status 07/16/2016 FINAL  Final  Anaerobic culture     Status: None (Preliminary result)   Collection Time: 07/16/16 11:29 AM  Result Value Ref Range Status   Specimen Description   Final    ABSCESS RIGHT FACE Performed at [facility]    Special Requests NONE  Final   Culture   Final    NO ANAEROBES ISOLATED; CULTURE IN PROGRESS FOR 5 DAYS   Report Status PENDING  Incomplete  Aerobic Culture (superficial specimen)     Status: None (Preliminary result)   Collection Time: 07/16/16 11:29 AM  Result Value Ref Range Status   Specimen Description ABSCESS RIGHT FACE  Final   Special Requests NONE  Final   Gram Stain   Final    RARE WBC PRESENT, PREDOMINANTLY PMN RARE GRAM NEGATIVE RODS Performed at [facility]    Culture FEW METHICILLIN RESISTANT STAPHYLOCOCCUS AUREUS   Final   Report Status PENDING  Incomplete   Organism ID, Bacteria METHICILLIN RESISTANT STAPHYLOCOCCUS AUREUS  Final      Susceptibility   Methicillin resistant staphylococcus aureus - MIC*    CIPROFLOXACIN >=8 RESISTANT Resistant     ERYTHROMYCIN >=8 RESISTANT Resistant     GENTAMICIN <=0.5 SENSITIVE Sensitive     OXACILLIN >=4 RESISTANT Resistant     TETRACYCLINE <=1 SENSITIVE Sensitive     VANCOMYCIN 1 SENSITIVE Sensitive     TRIMETH/SULFA <=10 SENSITIVE Sensitive     CLINDAMYCIN <=0.25 SENSITIVE Sensitive     RIFAMPIN <=0.5 SENSITIVE Sensitive     Inducible Clindamycin NEGATIVE Sensitive     * FEW METHICILLIN RESISTANT STAPHYLOCOCCUS AUREUS    RADIOLOGY:  No results found.  EKG:   Orders placed or performed during the hospital encounter of 07/12/16  . ED EKG within 10 minutes  . ED EKG within 10 minutes  . EKG 12-Lead  . EKG 12-Lead  . EKG 12-Lead  . EKG 12-Lead  . EKG 12-Lead  . EKG 12-Lead  . EKG 12-Lead  . EKG 12-Lead      Management plans discussed with the patient, family and they are in agreement.  CODE STATUS:     Code Status Orders        Start     Ordered   07/13/16 0309  Full code  Continuous     07/13/16 0308    Code Status History    Date Active Date Inactive Code Status Order ID Comments User Context   07/13/2016  3:08 AM 07/14/2016  9:10 AM Full Code [id number]  Saundra Shelling, MD Inpatient   06/06/2016 10:13 PM 06/11/2016 12:51 AM Full Code [id number]  Henreitta Leber, MD Inpatient      TOTAL TIME TAKING CARE OF THIS PATIENT: 40 minutes.    [name] M.D on 07/18/2016 at 1:58 PM  Between 7am to 6pm - Pager - [phone]  After 6pm go to [email or web address] - password [email or web address] [facility]  Office  [phone]  CC: Primary care physician; Tula Nakayama, MD

## 2016-07-18 NOTE — Progress Notes (Signed)
Discharge instructions explained to pt/ verbalized an understanding/ iv removed/ rx given to pt/ will transport off unit when ride arrives

## 2016-07-18 NOTE — Care Management Note (Signed)
Case Management Note  Patient Details  Name: [name] MRN: [id number] Date of Birth: April 13, 1966  Subjective/Objective:                 50yo Ms [name] was admitted 07/12/16 with right eye orbit cellulitis. Hx: ESRD on dialysis, Emphesma, HTN) Reports that her NP is Josephine Cables. Pharmacy=[facility] in [place]. She is currently residing at the [place] with her son and his girlfriend. No home oxygen. Has a cane, RW, and BSC. Uses [facility] to get to appointments. Currently open to [facility] for RN only. Verified this with Floydene Flock at [facility]. Case management will follow for discharge planning.                Action/Plan:  Resumption order placed for [facility] RN. Referral faxed to [facility] [phone]. Patient to DC on PO abx today. No further CM needs.   Expected Discharge Date:                  Expected Discharge Plan:  Home/Self Care  In-House Referral:  Clinical Social Work  Discharge planning Services  CM Consult  Post Acute Care Choice:  Home Health, Resumption of Svcs/PTA Provider Choice offered to:  Patient  DME Arranged:  N/A DME Agency:  NA  HH Arranged:  NA HH Agency:  NA  Status of Service:  Completed, signed off  If discussed at [employer] of Stay Meetings, dates discussed:    Additional Comments:  Carles Collet, RN 07/18/2016, 10:35 AM

## 2016-07-18 NOTE — Progress Notes (Signed)
[facility]  ROUNDING NOTE   Subjective:  Patient completed hemodialysis yesterday. She still has a mild bit of drainage from the right-sided facial abscess. Overall her periorbital cellulitis has decreased.   Objective:  Vital signs in last 24 hours:  Temp:  [97.8 F (36.6 C)-99.4 F (37.4 C)] 98.3 F (36.8 C) (07/29 0412) Pulse Rate:  [77-109] 93 (07/29 0412) Resp:  [9-20] 20 (07/29 0412) BP: (129-168)/(61-79) 150/73 (07/29 0412) SpO2:  [95 %-99 %] 99 % (07/29 0412) Weight:  [88.1 kg (194 lb 3.6 oz)-90 kg (198 lb 6.6 oz)] 88.1 kg (194 lb 3.6 oz) (07/28 1806)  Weight change:  Filed Weights   07/15/16 1530 07/17/16 1426 07/17/16 1806  Weight: 85.5 kg (188 lb 7.9 oz) 90 kg (198 lb 6.6 oz) 88.1 kg (194 lb 3.6 oz)    Intake/Output: I/O last 3 completed shifts: In: 213 [P.O.:120; IV Piggyback:93] Out: 1925 [Urine:625; Other:1300]   Intake/Output this shift:  No intake/output data recorded.  Physical Exam: General: No acute distress, sitting up in bed  Head: Normocephalic, atraumatic. Moist oral mucosal membranes  Eyes: Status post incision and drainage beneath the right eye. Decreased erythema surrounding the right eye.   Neck: Supple, trachea midline  Lungs:  Clear to auscultation, normal effort  Heart: S1S2 no rubs  Abdomen:  Soft, nontender, bowel sounds present  Extremities: Trace peripheral edema.  Neurologic: Nonfocal, moving all four extremities  Skin: No lesions  Access: Left upper extremity AV fistula    Basic Metabolic Panel:  Recent Labs Lab 07/12/16 1834 07/13/16 0526 07/17/16 0435  NA 131* 133* 131*  K 4.4 4.4 5.1  CL 99* 103 94*  CO2 20* 22 28  GLUCOSE 155* 212* 162*  BUN 45* 49* 40*  CREATININE 4.88* 5.40* 4.92*  CALCIUM 8.7* 8.2* 8.2*  PHOS  --  4.3 5.1*    Liver Function Tests: No results for input(s): AST, ALT, ALKPHOS, BILITOT, PROT, ALBUMIN in the last 168 hours. No results for input(s): LIPASE, AMYLASE in the last 168  hours. No results for input(s): AMMONIA in the last 168 hours.  CBC:  Recent Labs Lab 07/12/16 1834 07/13/16 0526 07/17/16 0435  WBC 8.1 6.3 5.0  HGB 11.7* 10.2* 10.4*  HCT 34.2* 29.8* 30.9*  MCV 92.7 92.7 92.9  PLT 159 121* 145*    Cardiac Enzymes:  Recent Labs Lab 07/12/16 1834 07/12/16 2131  TROPONINI 0.03* <0.03    BNP: Invalid input(s): POCBNP  CBG:  Recent Labs Lab 07/17/16 1126 07/17/16 1608 07/17/16 2245 07/18/16 0007 07/18/16 0759  GLUCAP 199* 107* 344* 69* 173*    Microbiology: Results for orders placed or performed during the hospital encounter of 07/12/16  MRSA PCR Screening     Status: None   Collection Time: 07/13/16  5:26 AM  Result Value Ref Range Status   MRSA by PCR NEGATIVE NEGATIVE Final    Comment:        The GeneXpert MRSA Assay (FDA approved for NASAL specimens only), is one component of a comprehensive MRSA colonization surveillance program. It is not intended to diagnose MRSA infection nor to guide or monitor treatment for MRSA infections.   Aerobic Culture (superficial specimen)     Status: None   Collection Time: 07/14/16  6:28 PM  Result Value Ref Range Status   Specimen Description FACE  Final   Special Requests NONE  Final   Gram Stain   Final    RARE WBC PRESENT, PREDOMINANTLY PMN NO ORGANISMS SEEN  Culture   Final    NO GROWTH 2 DAYS Performed at [facility]    Report Status 07/16/2016 FINAL  Final  Anaerobic culture     Status: None (Preliminary result)   Collection Time: 07/16/16 11:29 AM  Result Value Ref Range Status   Specimen Description   Final    ABSCESS RIGHT FACE Performed at [facility]    Special Requests NONE  Final   Gram Stain PENDING  Incomplete   Culture PENDING  Incomplete   Report Status PENDING  Incomplete  Aerobic Culture (superficial specimen)     Status: None (Preliminary result)   Collection Time: 07/16/16 11:29 AM  Result Value Ref Range Status   Specimen  Description ABSCESS RIGHT FACE  Final   Special Requests NONE  Final   Gram Stain   Final    RARE WBC PRESENT, PREDOMINANTLY PMN RARE GRAM NEGATIVE RODS Performed at [facility]    Culture FEW METHICILLIN RESISTANT STAPHYLOCOCCUS AUREUS  Final   Report Status PENDING  Incomplete   Organism ID, Bacteria METHICILLIN RESISTANT STAPHYLOCOCCUS AUREUS  Final      Susceptibility   Methicillin resistant staphylococcus aureus - MIC*    CIPROFLOXACIN >=8 RESISTANT Resistant     ERYTHROMYCIN >=8 RESISTANT Resistant     GENTAMICIN <=0.5 SENSITIVE Sensitive     OXACILLIN >=4 RESISTANT Resistant     TETRACYCLINE <=1 SENSITIVE Sensitive     VANCOMYCIN 1 SENSITIVE Sensitive     TRIMETH/SULFA <=10 SENSITIVE Sensitive     CLINDAMYCIN <=0.25 SENSITIVE Sensitive     RIFAMPIN <=0.5 SENSITIVE Sensitive     Inducible Clindamycin NEGATIVE Sensitive     * FEW METHICILLIN RESISTANT STAPHYLOCOCCUS AUREUS    Coagulation Studies: No results for input(s): LABPROT, INR in the last 72 hours.  Urinalysis: No results for input(s): COLORURINE, LABSPEC, PHURINE, GLUCOSEU, HGBUR, BILIRUBINUR, KETONESUR, PROTEINUR, UROBILINOGEN, NITRITE, LEUKOCYTESUR in the last 72 hours.  Invalid input(s): APPERANCEUR    Imaging: No results found.   Medications:     . aspirin EC  325 mg Oral Daily  . diltiazem  180 mg Oral BID  . docusate sodium  100 mg Oral BID  . epoetin (EPOGEN/PROCRIT) injection  4,000 Units Intravenous Q M,W,F-HD  . heparin  5,000 Units Subcutaneous Q8H  . hydrALAZINE  100 mg Oral BID  . insulin aspart  0-5 Units Subcutaneous QHS  . insulin aspart  0-9 Units Subcutaneous TID WC  . insulin aspart  4 Units Subcutaneous TID WC  . insulin glargine  15 Units Subcutaneous QHS  . multivitamin  1 tablet Oral QHS  . nitroGLYCERIN  1 inch Topical Q6H  . pantoprazole  40 mg Oral Q breakfast  . piperacillin-tazobactam (ZOSYN)  IV  3.375 g Intravenous Q12H  . sevelamer carbonate  1,600 mg Oral TID  WC  . sodium chloride flush  3 mL Intravenous Q12H  . vancomycin  1,000 mg Intravenous Q M,W,F-HD  . venlafaxine XR  150 mg Oral Daily   sodium chloride, ALPRAZolam, diphenhydrAMINE, ibuprofen, menthol-cetylpyridinium, morphine injection, nicotine polacrilex, nitroGLYCERIN, ondansetron **OR** ondansetron (ZOFRAN) IV, oxyCODONE, senna-docusate, sodium chloride flush  Assessment/ Plan:  50 y.o. female  with medical history of hypertension, diastolic heart failure, diabetes mellitus type 2, and anemia of chronic kidney disease, hepatitis C, depression, psoriasis, peripheral neuropathy, ESRD, admission for left foot blister/LLE cellulitis, right periorbital cellulitis  [facility] MWF  1.  End-stage renal disease on dialysis MWF. - Patient completed dialysis yesterday.  No acute indication for dialysis today. Next also some Monday.  2. Anemia chronic and disease: Hemoglobin currently 10.4. Continue Epogen with dialysis.  3. Secondary hyperparathyroidism. -   phosphorus 5.1. Continue current dosage of Renvela 1600 mg by mouth 3 times a day.  4. Hypertension. Blood pressure 150/73. - Continue diltiazem and hydralazine.  5. Right periorbital cellulitis with right-sided facial abscess status post incision and drainage 07/16/2016. Erythema significantly improved after drainage of right-sided facial abscess. Aerobic, anaerobic, and fungal cultures were taken.  There is some early growth of staph aureus. Anabolic management per hospitalist. Patient is on vancomycin.  LOS: 5 [name] 7/29/20179:53 AM

## 2016-07-18 NOTE — Progress Notes (Signed)
..   07/18/2016 9:51 AM  [name] [id number]  Post-Op Day 2    Temp:  [97.8 F (36.6 C)-99.4 F (37.4 C)] 98.3 F (36.8 C) (07/29 0412) Pulse Rate:  [77-109] 93 (07/29 0412) Resp:  [9-20] 20 (07/29 0412) BP: (129-168)/(61-79) 150/73 (07/29 0412) SpO2:  [95 %-99 %] 99 % (07/29 0412) Weight:  [88.1 kg (194 lb 3.6 oz)-90 kg (198 lb 6.6 oz)] 88.1 kg (194 lb 3.6 oz) (07/28 1806),     Intake/Output Summary (Last 24 hours) at 07/18/16 0951 Last data filed at 07/17/16 1806  Gross per 24 hour  Intake                0 ml  Output             1300 ml  Net            -1300 ml    Results for orders placed or performed during the hospital encounter of 07/12/16 (from the past 24 hour(s))  Glucose, capillary     Status: Abnormal   Collection Time: 07/17/16 11:26 AM  Result Value Ref Range   Glucose-Capillary 199 (H) 65 - 99 mg/dL  Glucose, capillary     Status: Abnormal   Collection Time: 07/17/16  4:08 PM  Result Value Ref Range   Glucose-Capillary 107 (H) 65 - 99 mg/dL  Glucose, capillary     Status: Abnormal   Collection Time: 07/17/16 10:45 PM  Result Value Ref Range   Glucose-Capillary 344 (H) 65 - 99 mg/dL  Glucose, capillary     Status: Abnormal   Collection Time: 07/18/16 12:07 AM  Result Value Ref Range   Glucose-Capillary 322 (H) 65 - 99 mg/dL  Glucose, capillary     Status: Abnormal   Collection Time: 07/18/16  7:59 AM  Result Value Ref Range   Glucose-Capillary 173 (H) 65 - 99 mg/dL   Comment 1 Notify RN     SUBJECTIVE:  Reports improved pain and swelling.  Continues to have drainage.  Reports no ointment being applied to wound.  Reports some nose bleeds with nos blowing  OBJECTIVE:   GEN- NAD, supine in bed EYE- continued significant improvement of edema and erythema and lower lid swelling FACE-  Continued erythema and edema but more centralized over I&D spot.  I&D patent with small amount of purulent drainage expressed.  Decreased induration over  midface  Culture-  MRSA   IMPRESSION:  MRSA Facial cellulitis/abscess as expected  PLAN: Abscess continues to improve.  I&D site continues to be open and drain.  No need for further surgical intervention as significant improvement and more centralized edema/erythema and continued drainage from previous I&D along with improvement in symptoms. Sensitivities back and continue vancomycin.  Will use Gentamicin ointment on nose as well as wound.  Continue prn dressing changes.    [name] 07/18/2016, 9:51 AM

## 2016-07-19 LAB — AEROBIC CULTURE W GRAM STAIN (SUPERFICIAL SPECIMEN)

## 2016-07-19 LAB — AEROBIC CULTURE  (SUPERFICIAL SPECIMEN)

## 2016-07-20 DIAGNOSIS — Z992 Dependence on renal dialysis: Secondary | ICD-10-CM | POA: Diagnosis not present

## 2016-07-20 DIAGNOSIS — N186 End stage renal disease: Secondary | ICD-10-CM | POA: Diagnosis not present

## 2016-07-20 DIAGNOSIS — L0201 Cutaneous abscess of face: Secondary | ICD-10-CM | POA: Diagnosis not present

## 2016-07-20 DIAGNOSIS — S01159A Open bite of unspecified eyelid and periocular area, initial encounter: Secondary | ICD-10-CM | POA: Diagnosis not present

## 2016-07-20 DIAGNOSIS — D509 Iron deficiency anemia, unspecified: Secondary | ICD-10-CM | POA: Diagnosis not present

## 2016-07-20 DIAGNOSIS — D631 Anemia in chronic kidney disease: Secondary | ICD-10-CM | POA: Diagnosis not present

## 2016-07-21 DIAGNOSIS — N186 End stage renal disease: Secondary | ICD-10-CM | POA: Diagnosis not present

## 2016-07-21 DIAGNOSIS — L03116 Cellulitis of left lower limb: Secondary | ICD-10-CM | POA: Diagnosis not present

## 2016-07-21 DIAGNOSIS — J449 Chronic obstructive pulmonary disease, unspecified: Secondary | ICD-10-CM | POA: Diagnosis not present

## 2016-07-21 DIAGNOSIS — T25222D Burn of second degree of left foot, subsequent encounter: Secondary | ICD-10-CM | POA: Diagnosis not present

## 2016-07-21 DIAGNOSIS — I120 Hypertensive chronic kidney disease with stage 5 chronic kidney disease or end stage renal disease: Secondary | ICD-10-CM | POA: Diagnosis not present

## 2016-07-21 DIAGNOSIS — E1122 Type 2 diabetes mellitus with diabetic chronic kidney disease: Secondary | ICD-10-CM | POA: Diagnosis not present

## 2016-07-21 LAB — GLUCOSE, CAPILLARY: Glucose-Capillary: 143 mg/dL — ABNORMAL HIGH (ref 65–99)

## 2016-07-21 LAB — ANAEROBIC CULTURE

## 2016-07-22 DIAGNOSIS — N186 End stage renal disease: Secondary | ICD-10-CM | POA: Diagnosis not present

## 2016-07-22 DIAGNOSIS — L0201 Cutaneous abscess of face: Secondary | ICD-10-CM | POA: Diagnosis not present

## 2016-07-22 DIAGNOSIS — Z992 Dependence on renal dialysis: Secondary | ICD-10-CM | POA: Diagnosis not present

## 2016-07-22 DIAGNOSIS — D509 Iron deficiency anemia, unspecified: Secondary | ICD-10-CM | POA: Diagnosis not present

## 2016-07-22 DIAGNOSIS — B9689 Other specified bacterial agents as the cause of diseases classified elsewhere: Secondary | ICD-10-CM | POA: Diagnosis not present

## 2016-07-22 DIAGNOSIS — D631 Anemia in chronic kidney disease: Secondary | ICD-10-CM | POA: Diagnosis not present

## 2016-07-24 DIAGNOSIS — Z992 Dependence on renal dialysis: Secondary | ICD-10-CM | POA: Diagnosis not present

## 2016-07-24 DIAGNOSIS — N186 End stage renal disease: Secondary | ICD-10-CM | POA: Diagnosis not present

## 2016-07-24 DIAGNOSIS — B9689 Other specified bacterial agents as the cause of diseases classified elsewhere: Secondary | ICD-10-CM | POA: Diagnosis not present

## 2016-07-24 DIAGNOSIS — L0201 Cutaneous abscess of face: Secondary | ICD-10-CM | POA: Diagnosis not present

## 2016-07-24 DIAGNOSIS — D631 Anemia in chronic kidney disease: Secondary | ICD-10-CM | POA: Diagnosis not present

## 2016-07-24 DIAGNOSIS — D509 Iron deficiency anemia, unspecified: Secondary | ICD-10-CM | POA: Diagnosis not present

## 2016-07-25 DIAGNOSIS — J449 Chronic obstructive pulmonary disease, unspecified: Secondary | ICD-10-CM | POA: Diagnosis not present

## 2016-07-25 DIAGNOSIS — I120 Hypertensive chronic kidney disease with stage 5 chronic kidney disease or end stage renal disease: Secondary | ICD-10-CM | POA: Diagnosis not present

## 2016-07-25 DIAGNOSIS — T25222D Burn of second degree of left foot, subsequent encounter: Secondary | ICD-10-CM | POA: Diagnosis not present

## 2016-07-25 DIAGNOSIS — L03116 Cellulitis of left lower limb: Secondary | ICD-10-CM | POA: Diagnosis not present

## 2016-07-25 DIAGNOSIS — N186 End stage renal disease: Secondary | ICD-10-CM | POA: Diagnosis not present

## 2016-07-25 DIAGNOSIS — E1122 Type 2 diabetes mellitus with diabetic chronic kidney disease: Secondary | ICD-10-CM | POA: Diagnosis not present

## 2016-07-27 DIAGNOSIS — L0201 Cutaneous abscess of face: Secondary | ICD-10-CM | POA: Diagnosis not present

## 2016-07-27 DIAGNOSIS — Z992 Dependence on renal dialysis: Secondary | ICD-10-CM | POA: Diagnosis not present

## 2016-07-27 DIAGNOSIS — N186 End stage renal disease: Secondary | ICD-10-CM | POA: Diagnosis not present

## 2016-07-27 DIAGNOSIS — D631 Anemia in chronic kidney disease: Secondary | ICD-10-CM | POA: Diagnosis not present

## 2016-07-27 DIAGNOSIS — B9689 Other specified bacterial agents as the cause of diseases classified elsewhere: Secondary | ICD-10-CM | POA: Diagnosis not present

## 2016-07-27 DIAGNOSIS — D509 Iron deficiency anemia, unspecified: Secondary | ICD-10-CM | POA: Diagnosis not present

## 2016-07-28 DIAGNOSIS — J449 Chronic obstructive pulmonary disease, unspecified: Secondary | ICD-10-CM | POA: Diagnosis not present

## 2016-07-28 DIAGNOSIS — N186 End stage renal disease: Secondary | ICD-10-CM | POA: Diagnosis not present

## 2016-07-28 DIAGNOSIS — T25222D Burn of second degree of left foot, subsequent encounter: Secondary | ICD-10-CM | POA: Diagnosis not present

## 2016-07-28 DIAGNOSIS — E1122 Type 2 diabetes mellitus with diabetic chronic kidney disease: Secondary | ICD-10-CM | POA: Diagnosis not present

## 2016-07-28 DIAGNOSIS — I120 Hypertensive chronic kidney disease with stage 5 chronic kidney disease or end stage renal disease: Secondary | ICD-10-CM | POA: Diagnosis not present

## 2016-07-28 DIAGNOSIS — L03116 Cellulitis of left lower limb: Secondary | ICD-10-CM | POA: Diagnosis not present

## 2016-07-29 DIAGNOSIS — B9689 Other specified bacterial agents as the cause of diseases classified elsewhere: Secondary | ICD-10-CM | POA: Diagnosis not present

## 2016-07-29 DIAGNOSIS — D631 Anemia in chronic kidney disease: Secondary | ICD-10-CM | POA: Diagnosis not present

## 2016-07-29 DIAGNOSIS — Z992 Dependence on renal dialysis: Secondary | ICD-10-CM | POA: Diagnosis not present

## 2016-07-29 DIAGNOSIS — L0201 Cutaneous abscess of face: Secondary | ICD-10-CM | POA: Diagnosis not present

## 2016-07-29 DIAGNOSIS — N186 End stage renal disease: Secondary | ICD-10-CM | POA: Diagnosis not present

## 2016-07-29 DIAGNOSIS — D509 Iron deficiency anemia, unspecified: Secondary | ICD-10-CM | POA: Diagnosis not present

## 2016-07-30 ENCOUNTER — Ambulatory Visit: Payer: Medicare Other | Admitting: Surgery

## 2016-07-31 DIAGNOSIS — Z992 Dependence on renal dialysis: Secondary | ICD-10-CM | POA: Diagnosis not present

## 2016-07-31 DIAGNOSIS — D631 Anemia in chronic kidney disease: Secondary | ICD-10-CM | POA: Diagnosis not present

## 2016-07-31 DIAGNOSIS — L0201 Cutaneous abscess of face: Secondary | ICD-10-CM | POA: Diagnosis not present

## 2016-07-31 DIAGNOSIS — N186 End stage renal disease: Secondary | ICD-10-CM | POA: Diagnosis not present

## 2016-07-31 DIAGNOSIS — B9689 Other specified bacterial agents as the cause of diseases classified elsewhere: Secondary | ICD-10-CM | POA: Diagnosis not present

## 2016-07-31 DIAGNOSIS — D509 Iron deficiency anemia, unspecified: Secondary | ICD-10-CM | POA: Diagnosis not present

## 2016-08-03 DIAGNOSIS — D631 Anemia in chronic kidney disease: Secondary | ICD-10-CM | POA: Diagnosis not present

## 2016-08-03 DIAGNOSIS — L0201 Cutaneous abscess of face: Secondary | ICD-10-CM | POA: Diagnosis not present

## 2016-08-03 DIAGNOSIS — N186 End stage renal disease: Secondary | ICD-10-CM | POA: Diagnosis not present

## 2016-08-03 DIAGNOSIS — Z992 Dependence on renal dialysis: Secondary | ICD-10-CM | POA: Diagnosis not present

## 2016-08-03 DIAGNOSIS — D509 Iron deficiency anemia, unspecified: Secondary | ICD-10-CM | POA: Diagnosis not present

## 2016-08-03 DIAGNOSIS — B9689 Other specified bacterial agents as the cause of diseases classified elsewhere: Secondary | ICD-10-CM | POA: Diagnosis not present

## 2016-08-04 DIAGNOSIS — T25222D Burn of second degree of left foot, subsequent encounter: Secondary | ICD-10-CM | POA: Diagnosis not present

## 2016-08-04 DIAGNOSIS — E1122 Type 2 diabetes mellitus with diabetic chronic kidney disease: Secondary | ICD-10-CM | POA: Diagnosis not present

## 2016-08-04 DIAGNOSIS — I120 Hypertensive chronic kidney disease with stage 5 chronic kidney disease or end stage renal disease: Secondary | ICD-10-CM | POA: Diagnosis not present

## 2016-08-04 DIAGNOSIS — L03116 Cellulitis of left lower limb: Secondary | ICD-10-CM | POA: Diagnosis not present

## 2016-08-04 DIAGNOSIS — J449 Chronic obstructive pulmonary disease, unspecified: Secondary | ICD-10-CM | POA: Diagnosis not present

## 2016-08-04 DIAGNOSIS — N186 End stage renal disease: Secondary | ICD-10-CM | POA: Diagnosis not present

## 2016-08-05 DIAGNOSIS — Z992 Dependence on renal dialysis: Secondary | ICD-10-CM | POA: Diagnosis not present

## 2016-08-05 DIAGNOSIS — D509 Iron deficiency anemia, unspecified: Secondary | ICD-10-CM | POA: Diagnosis not present

## 2016-08-05 DIAGNOSIS — D631 Anemia in chronic kidney disease: Secondary | ICD-10-CM | POA: Diagnosis not present

## 2016-08-05 DIAGNOSIS — B9689 Other specified bacterial agents as the cause of diseases classified elsewhere: Secondary | ICD-10-CM | POA: Diagnosis not present

## 2016-08-05 DIAGNOSIS — N186 End stage renal disease: Secondary | ICD-10-CM | POA: Diagnosis not present

## 2016-08-05 DIAGNOSIS — L0201 Cutaneous abscess of face: Secondary | ICD-10-CM | POA: Diagnosis not present

## 2016-08-07 DIAGNOSIS — D509 Iron deficiency anemia, unspecified: Secondary | ICD-10-CM | POA: Diagnosis not present

## 2016-08-07 DIAGNOSIS — Z992 Dependence on renal dialysis: Secondary | ICD-10-CM | POA: Diagnosis not present

## 2016-08-07 DIAGNOSIS — N186 End stage renal disease: Secondary | ICD-10-CM | POA: Diagnosis not present

## 2016-08-07 DIAGNOSIS — D631 Anemia in chronic kidney disease: Secondary | ICD-10-CM | POA: Diagnosis not present

## 2016-08-07 DIAGNOSIS — B9689 Other specified bacterial agents as the cause of diseases classified elsewhere: Secondary | ICD-10-CM | POA: Diagnosis not present

## 2016-08-07 DIAGNOSIS — L0201 Cutaneous abscess of face: Secondary | ICD-10-CM | POA: Diagnosis not present

## 2016-08-10 DIAGNOSIS — Z992 Dependence on renal dialysis: Secondary | ICD-10-CM | POA: Diagnosis not present

## 2016-08-10 DIAGNOSIS — D631 Anemia in chronic kidney disease: Secondary | ICD-10-CM | POA: Diagnosis not present

## 2016-08-10 DIAGNOSIS — D509 Iron deficiency anemia, unspecified: Secondary | ICD-10-CM | POA: Diagnosis not present

## 2016-08-10 DIAGNOSIS — B9689 Other specified bacterial agents as the cause of diseases classified elsewhere: Secondary | ICD-10-CM | POA: Diagnosis not present

## 2016-08-10 DIAGNOSIS — N186 End stage renal disease: Secondary | ICD-10-CM | POA: Diagnosis not present

## 2016-08-10 DIAGNOSIS — L0201 Cutaneous abscess of face: Secondary | ICD-10-CM | POA: Diagnosis not present

## 2016-08-11 DIAGNOSIS — Z01810 Encounter for preprocedural cardiovascular examination: Secondary | ICD-10-CM | POA: Diagnosis not present

## 2016-08-11 DIAGNOSIS — E1129 Type 2 diabetes mellitus with other diabetic kidney complication: Secondary | ICD-10-CM | POA: Diagnosis not present

## 2016-08-11 DIAGNOSIS — Z01818 Encounter for other preprocedural examination: Secondary | ICD-10-CM | POA: Diagnosis not present

## 2016-08-11 DIAGNOSIS — E1122 Type 2 diabetes mellitus with diabetic chronic kidney disease: Secondary | ICD-10-CM | POA: Diagnosis not present

## 2016-08-11 DIAGNOSIS — N186 End stage renal disease: Secondary | ICD-10-CM | POA: Diagnosis not present

## 2016-08-11 DIAGNOSIS — Z7682 Awaiting organ transplant status: Secondary | ICD-10-CM | POA: Insufficient documentation

## 2016-08-12 DIAGNOSIS — Z992 Dependence on renal dialysis: Secondary | ICD-10-CM | POA: Diagnosis not present

## 2016-08-12 DIAGNOSIS — B9689 Other specified bacterial agents as the cause of diseases classified elsewhere: Secondary | ICD-10-CM | POA: Diagnosis not present

## 2016-08-12 DIAGNOSIS — L0201 Cutaneous abscess of face: Secondary | ICD-10-CM | POA: Diagnosis not present

## 2016-08-12 DIAGNOSIS — N186 End stage renal disease: Secondary | ICD-10-CM | POA: Diagnosis not present

## 2016-08-12 DIAGNOSIS — D509 Iron deficiency anemia, unspecified: Secondary | ICD-10-CM | POA: Diagnosis not present

## 2016-08-12 DIAGNOSIS — D631 Anemia in chronic kidney disease: Secondary | ICD-10-CM | POA: Diagnosis not present

## 2016-08-14 DIAGNOSIS — L0201 Cutaneous abscess of face: Secondary | ICD-10-CM | POA: Diagnosis not present

## 2016-08-14 DIAGNOSIS — D509 Iron deficiency anemia, unspecified: Secondary | ICD-10-CM | POA: Diagnosis not present

## 2016-08-14 DIAGNOSIS — D631 Anemia in chronic kidney disease: Secondary | ICD-10-CM | POA: Diagnosis not present

## 2016-08-14 DIAGNOSIS — Z992 Dependence on renal dialysis: Secondary | ICD-10-CM | POA: Diagnosis not present

## 2016-08-14 DIAGNOSIS — B9689 Other specified bacterial agents as the cause of diseases classified elsewhere: Secondary | ICD-10-CM | POA: Diagnosis not present

## 2016-08-14 DIAGNOSIS — N186 End stage renal disease: Secondary | ICD-10-CM | POA: Diagnosis not present

## 2016-08-14 LAB — FUNGAL ORGANISM REFLEX

## 2016-08-14 LAB — FUNGUS CULTURE RESULT

## 2016-08-14 LAB — FUNGUS CULTURE WITH STAIN

## 2016-08-18 DIAGNOSIS — T82858A Stenosis of vascular prosthetic devices, implants and grafts, initial encounter: Secondary | ICD-10-CM | POA: Diagnosis not present

## 2016-08-19 DIAGNOSIS — Z992 Dependence on renal dialysis: Secondary | ICD-10-CM | POA: Diagnosis not present

## 2016-08-19 DIAGNOSIS — N186 End stage renal disease: Secondary | ICD-10-CM | POA: Diagnosis not present

## 2016-08-19 DIAGNOSIS — D631 Anemia in chronic kidney disease: Secondary | ICD-10-CM | POA: Diagnosis not present

## 2016-08-19 DIAGNOSIS — L0201 Cutaneous abscess of face: Secondary | ICD-10-CM | POA: Diagnosis not present

## 2016-08-19 DIAGNOSIS — B9689 Other specified bacterial agents as the cause of diseases classified elsewhere: Secondary | ICD-10-CM | POA: Diagnosis not present

## 2016-08-19 DIAGNOSIS — D509 Iron deficiency anemia, unspecified: Secondary | ICD-10-CM | POA: Diagnosis not present

## 2016-08-20 DIAGNOSIS — N186 End stage renal disease: Secondary | ICD-10-CM | POA: Diagnosis not present

## 2016-08-20 DIAGNOSIS — Z992 Dependence on renal dialysis: Secondary | ICD-10-CM | POA: Diagnosis not present

## 2016-08-21 DIAGNOSIS — N186 End stage renal disease: Secondary | ICD-10-CM | POA: Diagnosis not present

## 2016-08-21 DIAGNOSIS — D631 Anemia in chronic kidney disease: Secondary | ICD-10-CM | POA: Diagnosis not present

## 2016-08-21 DIAGNOSIS — N2581 Secondary hyperparathyroidism of renal origin: Secondary | ICD-10-CM | POA: Diagnosis not present

## 2016-08-21 DIAGNOSIS — D509 Iron deficiency anemia, unspecified: Secondary | ICD-10-CM | POA: Diagnosis not present

## 2016-08-21 DIAGNOSIS — Z23 Encounter for immunization: Secondary | ICD-10-CM | POA: Diagnosis not present

## 2016-08-21 DIAGNOSIS — Z992 Dependence on renal dialysis: Secondary | ICD-10-CM | POA: Diagnosis not present

## 2016-08-24 DIAGNOSIS — D509 Iron deficiency anemia, unspecified: Secondary | ICD-10-CM | POA: Diagnosis not present

## 2016-08-24 DIAGNOSIS — N2581 Secondary hyperparathyroidism of renal origin: Secondary | ICD-10-CM | POA: Diagnosis not present

## 2016-08-24 DIAGNOSIS — Z992 Dependence on renal dialysis: Secondary | ICD-10-CM | POA: Diagnosis not present

## 2016-08-24 DIAGNOSIS — N186 End stage renal disease: Secondary | ICD-10-CM | POA: Diagnosis not present

## 2016-08-24 DIAGNOSIS — D631 Anemia in chronic kidney disease: Secondary | ICD-10-CM | POA: Diagnosis not present

## 2016-08-24 DIAGNOSIS — Z23 Encounter for immunization: Secondary | ICD-10-CM | POA: Diagnosis not present

## 2016-08-28 DIAGNOSIS — Z992 Dependence on renal dialysis: Secondary | ICD-10-CM | POA: Diagnosis not present

## 2016-08-28 DIAGNOSIS — D509 Iron deficiency anemia, unspecified: Secondary | ICD-10-CM | POA: Diagnosis not present

## 2016-08-28 DIAGNOSIS — D631 Anemia in chronic kidney disease: Secondary | ICD-10-CM | POA: Diagnosis not present

## 2016-08-28 DIAGNOSIS — N2581 Secondary hyperparathyroidism of renal origin: Secondary | ICD-10-CM | POA: Diagnosis not present

## 2016-08-28 DIAGNOSIS — Z23 Encounter for immunization: Secondary | ICD-10-CM | POA: Diagnosis not present

## 2016-08-28 DIAGNOSIS — N186 End stage renal disease: Secondary | ICD-10-CM | POA: Diagnosis not present

## 2016-08-30 DIAGNOSIS — D509 Iron deficiency anemia, unspecified: Secondary | ICD-10-CM | POA: Diagnosis not present

## 2016-08-30 DIAGNOSIS — N186 End stage renal disease: Secondary | ICD-10-CM | POA: Diagnosis not present

## 2016-08-30 DIAGNOSIS — N2581 Secondary hyperparathyroidism of renal origin: Secondary | ICD-10-CM | POA: Diagnosis not present

## 2016-08-30 DIAGNOSIS — Z992 Dependence on renal dialysis: Secondary | ICD-10-CM | POA: Diagnosis not present

## 2016-08-30 DIAGNOSIS — Z23 Encounter for immunization: Secondary | ICD-10-CM | POA: Diagnosis not present

## 2016-08-30 DIAGNOSIS — D631 Anemia in chronic kidney disease: Secondary | ICD-10-CM | POA: Diagnosis not present

## 2016-09-02 DIAGNOSIS — D509 Iron deficiency anemia, unspecified: Secondary | ICD-10-CM | POA: Diagnosis not present

## 2016-09-02 DIAGNOSIS — N2581 Secondary hyperparathyroidism of renal origin: Secondary | ICD-10-CM | POA: Diagnosis not present

## 2016-09-02 DIAGNOSIS — N186 End stage renal disease: Secondary | ICD-10-CM | POA: Diagnosis not present

## 2016-09-02 DIAGNOSIS — D631 Anemia in chronic kidney disease: Secondary | ICD-10-CM | POA: Diagnosis not present

## 2016-09-02 DIAGNOSIS — Z992 Dependence on renal dialysis: Secondary | ICD-10-CM | POA: Diagnosis not present

## 2016-09-02 DIAGNOSIS — Z23 Encounter for immunization: Secondary | ICD-10-CM | POA: Diagnosis not present

## 2016-09-04 DIAGNOSIS — Z23 Encounter for immunization: Secondary | ICD-10-CM | POA: Diagnosis not present

## 2016-09-04 DIAGNOSIS — N186 End stage renal disease: Secondary | ICD-10-CM | POA: Diagnosis not present

## 2016-09-04 DIAGNOSIS — N2581 Secondary hyperparathyroidism of renal origin: Secondary | ICD-10-CM | POA: Diagnosis not present

## 2016-09-04 DIAGNOSIS — D631 Anemia in chronic kidney disease: Secondary | ICD-10-CM | POA: Diagnosis not present

## 2016-09-04 DIAGNOSIS — Z992 Dependence on renal dialysis: Secondary | ICD-10-CM | POA: Diagnosis not present

## 2016-09-04 DIAGNOSIS — E113512 Type 2 diabetes mellitus with proliferative diabetic retinopathy with macular edema, left eye: Secondary | ICD-10-CM | POA: Diagnosis not present

## 2016-09-04 DIAGNOSIS — D509 Iron deficiency anemia, unspecified: Secondary | ICD-10-CM | POA: Diagnosis not present

## 2016-09-07 DIAGNOSIS — N186 End stage renal disease: Secondary | ICD-10-CM | POA: Diagnosis not present

## 2016-09-07 DIAGNOSIS — Z794 Long term (current) use of insulin: Secondary | ICD-10-CM | POA: Diagnosis not present

## 2016-09-07 DIAGNOSIS — N2581 Secondary hyperparathyroidism of renal origin: Secondary | ICD-10-CM | POA: Diagnosis not present

## 2016-09-07 DIAGNOSIS — Z992 Dependence on renal dialysis: Secondary | ICD-10-CM | POA: Diagnosis not present

## 2016-09-07 DIAGNOSIS — D631 Anemia in chronic kidney disease: Secondary | ICD-10-CM | POA: Diagnosis not present

## 2016-09-07 DIAGNOSIS — E119 Type 2 diabetes mellitus without complications: Secondary | ICD-10-CM | POA: Diagnosis not present

## 2016-09-07 DIAGNOSIS — D509 Iron deficiency anemia, unspecified: Secondary | ICD-10-CM | POA: Diagnosis not present

## 2016-09-07 DIAGNOSIS — Z23 Encounter for immunization: Secondary | ICD-10-CM | POA: Diagnosis not present

## 2016-09-09 DIAGNOSIS — N2581 Secondary hyperparathyroidism of renal origin: Secondary | ICD-10-CM | POA: Diagnosis not present

## 2016-09-09 DIAGNOSIS — Z23 Encounter for immunization: Secondary | ICD-10-CM | POA: Diagnosis not present

## 2016-09-09 DIAGNOSIS — D631 Anemia in chronic kidney disease: Secondary | ICD-10-CM | POA: Diagnosis not present

## 2016-09-09 DIAGNOSIS — Z992 Dependence on renal dialysis: Secondary | ICD-10-CM | POA: Diagnosis not present

## 2016-09-09 DIAGNOSIS — D509 Iron deficiency anemia, unspecified: Secondary | ICD-10-CM | POA: Diagnosis not present

## 2016-09-09 DIAGNOSIS — N186 End stage renal disease: Secondary | ICD-10-CM | POA: Diagnosis not present

## 2016-09-11 DIAGNOSIS — N186 End stage renal disease: Secondary | ICD-10-CM | POA: Diagnosis not present

## 2016-09-11 DIAGNOSIS — N2581 Secondary hyperparathyroidism of renal origin: Secondary | ICD-10-CM | POA: Diagnosis not present

## 2016-09-11 DIAGNOSIS — Z992 Dependence on renal dialysis: Secondary | ICD-10-CM | POA: Diagnosis not present

## 2016-09-11 DIAGNOSIS — D509 Iron deficiency anemia, unspecified: Secondary | ICD-10-CM | POA: Diagnosis not present

## 2016-09-11 DIAGNOSIS — Z23 Encounter for immunization: Secondary | ICD-10-CM | POA: Diagnosis not present

## 2016-09-11 DIAGNOSIS — D631 Anemia in chronic kidney disease: Secondary | ICD-10-CM | POA: Diagnosis not present

## 2016-09-14 DIAGNOSIS — N186 End stage renal disease: Secondary | ICD-10-CM | POA: Diagnosis not present

## 2016-09-14 DIAGNOSIS — N2581 Secondary hyperparathyroidism of renal origin: Secondary | ICD-10-CM | POA: Diagnosis not present

## 2016-09-14 DIAGNOSIS — D631 Anemia in chronic kidney disease: Secondary | ICD-10-CM | POA: Diagnosis not present

## 2016-09-14 DIAGNOSIS — Z992 Dependence on renal dialysis: Secondary | ICD-10-CM | POA: Diagnosis not present

## 2016-09-14 DIAGNOSIS — Z23 Encounter for immunization: Secondary | ICD-10-CM | POA: Diagnosis not present

## 2016-09-14 DIAGNOSIS — D509 Iron deficiency anemia, unspecified: Secondary | ICD-10-CM | POA: Diagnosis not present

## 2016-09-19 DIAGNOSIS — N2581 Secondary hyperparathyroidism of renal origin: Secondary | ICD-10-CM | POA: Diagnosis not present

## 2016-09-19 DIAGNOSIS — N186 End stage renal disease: Secondary | ICD-10-CM | POA: Diagnosis not present

## 2016-09-19 DIAGNOSIS — D631 Anemia in chronic kidney disease: Secondary | ICD-10-CM | POA: Diagnosis not present

## 2016-09-19 DIAGNOSIS — Z992 Dependence on renal dialysis: Secondary | ICD-10-CM | POA: Diagnosis not present

## 2016-09-19 DIAGNOSIS — D509 Iron deficiency anemia, unspecified: Secondary | ICD-10-CM | POA: Diagnosis not present

## 2016-09-19 DIAGNOSIS — Z23 Encounter for immunization: Secondary | ICD-10-CM | POA: Diagnosis not present

## 2016-09-21 DIAGNOSIS — Z992 Dependence on renal dialysis: Secondary | ICD-10-CM | POA: Diagnosis not present

## 2016-09-21 DIAGNOSIS — N186 End stage renal disease: Secondary | ICD-10-CM | POA: Diagnosis not present

## 2016-09-21 DIAGNOSIS — D509 Iron deficiency anemia, unspecified: Secondary | ICD-10-CM | POA: Diagnosis not present

## 2016-09-21 DIAGNOSIS — D631 Anemia in chronic kidney disease: Secondary | ICD-10-CM | POA: Diagnosis not present

## 2016-09-23 DIAGNOSIS — Z992 Dependence on renal dialysis: Secondary | ICD-10-CM | POA: Diagnosis not present

## 2016-09-23 DIAGNOSIS — D631 Anemia in chronic kidney disease: Secondary | ICD-10-CM | POA: Diagnosis not present

## 2016-09-23 DIAGNOSIS — N186 End stage renal disease: Secondary | ICD-10-CM | POA: Diagnosis not present

## 2016-09-23 DIAGNOSIS — D509 Iron deficiency anemia, unspecified: Secondary | ICD-10-CM | POA: Diagnosis not present

## 2016-09-25 DIAGNOSIS — Z992 Dependence on renal dialysis: Secondary | ICD-10-CM | POA: Diagnosis not present

## 2016-09-25 DIAGNOSIS — N186 End stage renal disease: Secondary | ICD-10-CM | POA: Diagnosis not present

## 2016-09-25 DIAGNOSIS — D631 Anemia in chronic kidney disease: Secondary | ICD-10-CM | POA: Diagnosis not present

## 2016-09-25 DIAGNOSIS — D509 Iron deficiency anemia, unspecified: Secondary | ICD-10-CM | POA: Diagnosis not present

## 2016-09-30 DIAGNOSIS — Z992 Dependence on renal dialysis: Secondary | ICD-10-CM | POA: Diagnosis not present

## 2016-09-30 DIAGNOSIS — D509 Iron deficiency anemia, unspecified: Secondary | ICD-10-CM | POA: Diagnosis not present

## 2016-09-30 DIAGNOSIS — D631 Anemia in chronic kidney disease: Secondary | ICD-10-CM | POA: Diagnosis not present

## 2016-09-30 DIAGNOSIS — N186 End stage renal disease: Secondary | ICD-10-CM | POA: Diagnosis not present

## 2016-10-02 DIAGNOSIS — N186 End stage renal disease: Secondary | ICD-10-CM | POA: Diagnosis not present

## 2016-10-02 DIAGNOSIS — D509 Iron deficiency anemia, unspecified: Secondary | ICD-10-CM | POA: Diagnosis not present

## 2016-10-02 DIAGNOSIS — D631 Anemia in chronic kidney disease: Secondary | ICD-10-CM | POA: Diagnosis not present

## 2016-10-02 DIAGNOSIS — Z992 Dependence on renal dialysis: Secondary | ICD-10-CM | POA: Diagnosis not present

## 2016-10-05 DIAGNOSIS — N186 End stage renal disease: Secondary | ICD-10-CM | POA: Diagnosis not present

## 2016-10-05 DIAGNOSIS — Z992 Dependence on renal dialysis: Secondary | ICD-10-CM | POA: Diagnosis not present

## 2016-10-05 DIAGNOSIS — D631 Anemia in chronic kidney disease: Secondary | ICD-10-CM | POA: Diagnosis not present

## 2016-10-05 DIAGNOSIS — D509 Iron deficiency anemia, unspecified: Secondary | ICD-10-CM | POA: Diagnosis not present

## 2016-10-06 DIAGNOSIS — H4311 Vitreous hemorrhage, right eye: Secondary | ICD-10-CM | POA: Diagnosis not present

## 2016-10-07 DIAGNOSIS — N186 End stage renal disease: Secondary | ICD-10-CM | POA: Diagnosis not present

## 2016-10-07 DIAGNOSIS — Z992 Dependence on renal dialysis: Secondary | ICD-10-CM | POA: Diagnosis not present

## 2016-10-07 DIAGNOSIS — D509 Iron deficiency anemia, unspecified: Secondary | ICD-10-CM | POA: Diagnosis not present

## 2016-10-07 DIAGNOSIS — D631 Anemia in chronic kidney disease: Secondary | ICD-10-CM | POA: Diagnosis not present

## 2016-10-09 DIAGNOSIS — H4311 Vitreous hemorrhage, right eye: Secondary | ICD-10-CM | POA: Diagnosis not present

## 2016-10-12 DIAGNOSIS — D631 Anemia in chronic kidney disease: Secondary | ICD-10-CM | POA: Diagnosis not present

## 2016-10-12 DIAGNOSIS — Z992 Dependence on renal dialysis: Secondary | ICD-10-CM | POA: Diagnosis not present

## 2016-10-12 DIAGNOSIS — D509 Iron deficiency anemia, unspecified: Secondary | ICD-10-CM | POA: Diagnosis not present

## 2016-10-12 DIAGNOSIS — N186 End stage renal disease: Secondary | ICD-10-CM | POA: Diagnosis not present

## 2016-10-14 DIAGNOSIS — D631 Anemia in chronic kidney disease: Secondary | ICD-10-CM | POA: Diagnosis not present

## 2016-10-14 DIAGNOSIS — Z992 Dependence on renal dialysis: Secondary | ICD-10-CM | POA: Diagnosis not present

## 2016-10-14 DIAGNOSIS — D509 Iron deficiency anemia, unspecified: Secondary | ICD-10-CM | POA: Diagnosis not present

## 2016-10-14 DIAGNOSIS — N186 End stage renal disease: Secondary | ICD-10-CM | POA: Diagnosis not present

## 2016-10-16 DIAGNOSIS — D631 Anemia in chronic kidney disease: Secondary | ICD-10-CM | POA: Diagnosis not present

## 2016-10-16 DIAGNOSIS — Z992 Dependence on renal dialysis: Secondary | ICD-10-CM | POA: Diagnosis not present

## 2016-10-16 DIAGNOSIS — N186 End stage renal disease: Secondary | ICD-10-CM | POA: Diagnosis not present

## 2016-10-16 DIAGNOSIS — H4311 Vitreous hemorrhage, right eye: Secondary | ICD-10-CM | POA: Diagnosis not present

## 2016-10-16 DIAGNOSIS — D509 Iron deficiency anemia, unspecified: Secondary | ICD-10-CM | POA: Diagnosis not present

## 2016-10-16 DIAGNOSIS — E113511 Type 2 diabetes mellitus with proliferative diabetic retinopathy with macular edema, right eye: Secondary | ICD-10-CM | POA: Diagnosis not present

## 2016-10-19 ENCOUNTER — Emergency Department
Admission: EM | Admit: 2016-10-19 | Discharge: 2016-10-19 | Disposition: A | Discharge status: Home / Self Care | Payer: Medicare Other | Attending: Emergency Medicine | Admitting: Emergency Medicine

## 2016-10-19 ENCOUNTER — Encounter: Payer: Self-pay | Admitting: Emergency Medicine

## 2016-10-19 DIAGNOSIS — Z992 Dependence on renal dialysis: Secondary | ICD-10-CM | POA: Insufficient documentation

## 2016-10-19 DIAGNOSIS — I509 Heart failure, unspecified: Secondary | ICD-10-CM | POA: Insufficient documentation

## 2016-10-19 DIAGNOSIS — R112 Nausea with vomiting, unspecified: Secondary | ICD-10-CM

## 2016-10-19 DIAGNOSIS — Z794 Long term (current) use of insulin: Secondary | ICD-10-CM | POA: Insufficient documentation

## 2016-10-19 DIAGNOSIS — T428X5A Adverse effect of antiparkinsonism drugs and other central muscle-tone depressants, initial encounter: Secondary | ICD-10-CM | POA: Diagnosis not present

## 2016-10-19 DIAGNOSIS — Y69 Unspecified misadventure during surgical and medical care: Secondary | ICD-10-CM | POA: Insufficient documentation

## 2016-10-19 DIAGNOSIS — E1122 Type 2 diabetes mellitus with diabetic chronic kidney disease: Secondary | ICD-10-CM | POA: Insufficient documentation

## 2016-10-19 DIAGNOSIS — T887XXA Unspecified adverse effect of drug or medicament, initial encounter: Secondary | ICD-10-CM | POA: Diagnosis not present

## 2016-10-19 DIAGNOSIS — J449 Chronic obstructive pulmonary disease, unspecified: Secondary | ICD-10-CM | POA: Insufficient documentation

## 2016-10-19 DIAGNOSIS — M549 Dorsalgia, unspecified: Secondary | ICD-10-CM | POA: Insufficient documentation

## 2016-10-19 DIAGNOSIS — Z79899 Other long term (current) drug therapy: Secondary | ICD-10-CM | POA: Diagnosis not present

## 2016-10-19 DIAGNOSIS — I132 Hypertensive heart and chronic kidney disease with heart failure and with stage 5 chronic kidney disease, or end stage renal disease: Secondary | ICD-10-CM | POA: Insufficient documentation

## 2016-10-19 DIAGNOSIS — I120 Hypertensive chronic kidney disease with stage 5 chronic kidney disease or end stage renal disease: Secondary | ICD-10-CM | POA: Diagnosis not present

## 2016-10-19 DIAGNOSIS — D509 Iron deficiency anemia, unspecified: Secondary | ICD-10-CM | POA: Diagnosis not present

## 2016-10-19 DIAGNOSIS — N186 End stage renal disease: Secondary | ICD-10-CM | POA: Insufficient documentation

## 2016-10-19 DIAGNOSIS — G8929 Other chronic pain: Secondary | ICD-10-CM | POA: Diagnosis not present

## 2016-10-19 DIAGNOSIS — D631 Anemia in chronic kidney disease: Secondary | ICD-10-CM | POA: Diagnosis not present

## 2016-10-19 LAB — CBC
HEMATOCRIT: 30.9 % — AB (ref 35.0–47.0)
Hemoglobin: 10.6 g/dL — ABNORMAL LOW (ref 12.0–16.0)
MCH: 33 pg (ref 26.0–34.0)
MCHC: 34.5 g/dL (ref 32.0–36.0)
MCV: 95.7 fL (ref 80.0–100.0)
PLATELETS: 175 10*3/uL (ref 150–440)
RBC: 3.22 MIL/uL — ABNORMAL LOW (ref 3.80–5.20)
RDW: 15.1 % — AB (ref 11.5–14.5)
WBC: 5.8 10*3/uL (ref 3.6–11.0)

## 2016-10-19 LAB — COMPREHENSIVE METABOLIC PANEL
ALBUMIN: 3.9 g/dL (ref 3.5–5.0)
ALT: 96 U/L — AB (ref 14–54)
AST: 70 U/L — AB (ref 15–41)
Alkaline Phosphatase: 78 U/L (ref 38–126)
Anion gap: 13 (ref 5–15)
BUN: 58 mg/dL — AB (ref 6–20)
CHLORIDE: 100 mmol/L — AB (ref 101–111)
CO2: 20 mmol/L — AB (ref 22–32)
CREATININE: 6.45 mg/dL — AB (ref 0.44–1.00)
Calcium: 8.7 mg/dL — ABNORMAL LOW (ref 8.9–10.3)
GFR calc Af Amer: 8 mL/min — ABNORMAL LOW (ref >60)
GFR, EST NON AFRICAN AMERICAN: 7 mL/min — AB (ref >60)
GLUCOSE: 308 mg/dL — AB (ref 65–99)
Potassium: 4.4 mmol/L (ref 3.5–5.1)
Sodium: 133 mmol/L — ABNORMAL LOW (ref 135–145)
Total Bilirubin: 0.2 mg/dL — ABNORMAL LOW (ref 0.3–1.2)
Total Protein: 8.6 g/dL — ABNORMAL HIGH (ref 6.5–8.1)

## 2016-10-19 LAB — URINALYSIS COMPLETE WITH MICROSCOPIC (ARMC ONLY)
BACTERIA UA: NONE SEEN
BILIRUBIN URINE: NEGATIVE
Hgb urine dipstick: NEGATIVE
KETONES UR: NEGATIVE mg/dL
Nitrite: NEGATIVE
PH: 7 (ref 5.0–8.0)
Protein, ur: >500 mg/dL — AB
Specific Gravity, Urine: 1.011 (ref 1.005–1.030)

## 2016-10-19 LAB — LIPASE, BLOOD: Lipase: 28 U/L (ref 11–51)

## 2016-10-19 MED ORDER — PROMETHAZINE HCL 25 MG PO TABS: 25.0000 mg | ORAL_TABLET | ORAL | 1 refills | Status: DC | PRN

## 2016-10-19 MED ORDER — OXYCODONE HCL ER 10 MG PO T12A
10.0000 mg | EXTENDED_RELEASE_TABLET | Freq: Once | ORAL | Status: AC
  Administered 2016-10-19: 10 mg via ORAL

## 2016-10-19 MED ORDER — ONDANSETRON 4 MG PO TBDP
4.0000 mg | ORAL_TABLET | Freq: Once | ORAL | Status: AC
  Administered 2016-10-19: 4 mg via ORAL
  Filled 2016-10-19: qty 1

## 2016-10-19 MED ORDER — OXYCODONE HCL 5 MG PO TABS: 5.0000 mg | ORAL_TABLET | Freq: Three times a day (TID) | ORAL | 0 refills | Status: DC | PRN

## 2016-10-19 MED ORDER — PROMETHAZINE HCL 25 MG PO TABS
25.0000 mg | ORAL_TABLET | Freq: Once | ORAL | Status: AC
Start: 2016-10-19 — End: 2016-10-19
  Administered 2016-10-19: 25 mg via ORAL
  Filled 2016-10-19: qty 1

## 2016-10-19 MED ORDER — OXYCODONE HCL ER 10 MG PO T12A
EXTENDED_RELEASE_TABLET | ORAL | Status: AC
  Administered 2016-10-19: 10 mg via ORAL
  Filled 2016-10-19: qty 1

## 2016-10-19 MED ORDER — OXYCODONE-ACETAMINOPHEN 5-325 MG PO TABS
2.0000 | ORAL_TABLET | Freq: Once | ORAL | Status: AC
  Administered 2016-10-19: 2 via ORAL
  Filled 2016-10-19: qty 2

## 2016-10-19 NOTE — ED Notes (Signed)
Advised MD that pt was allergic to Tylenol - Percocet not given - see new orders

## 2016-10-19 NOTE — ED Notes (Signed)
Pt reports elevated BP and vomiting with diarrhea - diarrhea for 4 weeks and vomiting since Sat am - loose stools occur after she eats - pt has taken Baclofen for pain because she ran out of oxycodone - Baclofen has made her feel like she has ringing in her ears and this is making her nauseous

## 2016-10-19 NOTE — ED Triage Notes (Signed)
Pt reports being put on baclofen on Friday. Pt reports 3 episodes of vomiting since Saturday morning. Also states that she started having ringing in her ears. Pt reports having elevated blood pressure since Friday night. Reports compliance with blood pressure meds. Denies dizziness and HA but states "I could hear my heart beating in my ears."

## 2016-10-19 NOTE — ED Provider Notes (Signed)
[facility] Emergency Department Provider Note        Time seen: ----------------------------------------- 12:12 PM on 10/19/2016 -----------------------------------------    I have reviewed the triage vital signs and the nursing notes.   HISTORY  Chief Complaint Emesis and Hypertension    HPI [name] is a 50 y.o. female who presents to the ER for 3 episodes of vomiting since Saturday morning. Patient states she's been having ringing in her years since starting baclofen. She did have her history of problems with taking baclofen the past. Blood pressures been elevated since Friday night, she reports compliance with medications. She denies dizziness and headache but states she could hear her heartbeat beating in her ears. She states after dialysis on Friday she was above her dry weight, was told to come back on Saturday if her symptoms didn't improve. Patient presents today with nausea, vomiting and chronic pain.   Past Medical History:  Diagnosis Date  . Anemia   . Anxiety   . Arthritis   . CHF (congestive heart failure) ([place])   . Chronic kidney disease   . COPD (chronic obstructive pulmonary disease) ([place])   . Depression   . Diabetes mellitus without complication ([place])   . Emphysema of lung ([place])   . End stage renal disease ([place])   . Hepatitis   . Hypercholesterolemia   . Hypertension   . Tobacco dependence     Patient Active Problem List   Diagnosis Date Noted  . MRSA (methicillin resistant Staphylococcus aureus) infection 07/18/2016  . Status post incision and drainage 07/18/2016  . Chronic ulcer of left foot ([place]) 07/18/2016  . Acquired palmar and plantar hyperkeratosis 07/18/2016  . Elevated troponin 07/18/2016  . Essential hypertension 07/18/2016  . Epistaxis 07/18/2016  . Tobacco abuse 07/18/2016  . ESRD on dialysis ([place]) 07/18/2016  . Orbital cellulitis on right 07/13/2016  . Cellulitis 06/06/2016  . Adjustment disorder with  mixed disturbance of emotions and conduct 05/11/2016  . Dysthymia 05/11/2016    Past Surgical History:  Procedure Laterality Date  . CHOLECYSTECTOMY    . cyst removed  from rt hand    . INCISION AND DRAINAGE ABSCESS N/A 07/16/2016   Procedure: INCISION AND DRAINAGE ABSCESS;  Surgeon: Carloyn Manner, MD;  Location: [facility] ORS;  Service: ENT;  Laterality: N/A;  . PERIPHERAL VASCULAR CATHETERIZATION N/A 07/25/2015   Procedure: A/V Shuntogram/Fistulagram;  Surgeon: Algernon Huxley, MD;  Location: [place] [facility];  Service: Cardiovascular;  Laterality: N/A;  . PERIPHERAL VASCULAR CATHETERIZATION Left 07/25/2015   Procedure: A/V Shunt Intervention;  Surgeon: Algernon Huxley, MD;  Location: [place] [facility];  Service: Cardiovascular;  Laterality: Left;  . PERIPHERAL VASCULAR CATHETERIZATION Left 10/07/2015   Procedure: A/V Shuntogram/Fistulagram;  Surgeon: Algernon Huxley, MD;  Location: [place] [facility];  Service: Cardiovascular;  Laterality: Left;  . PERIPHERAL VASCULAR CATHETERIZATION N/A 10/07/2015   Procedure: A/V Shunt Intervention;  Surgeon: Algernon Huxley, MD;  Location: [place] [facility];  Service: Cardiovascular;  Laterality: N/A;  . PERIPHERAL VASCULAR CATHETERIZATION  10/07/2015   Procedure: Dialysis/Perma Catheter Insertion;  Surgeon: Algernon Huxley, MD;  Location: [place] [facility];  Service: Cardiovascular;;  . PERIPHERAL VASCULAR CATHETERIZATION N/A 12/17/2015   Procedure: Dialysis/Perma Catheter Removal;  Surgeon: Katha Cabal, MD;  Location: [place] [facility];  Service: Cardiovascular;  Laterality: N/A;  . rt. tubal and ovary removed    . TONSILLECTOMY      Allergies Cinnamon; Cinoxate; Tylenol [  acetaminophen]; Garlic; and Onion  Social History Social History  Substance Use Topics  . Smoking status: Current Every Day Smoker    Packs/day: 0.50    Years: 17.00    Types: Cigarettes  . Smokeless tobacco: Never Used  . Alcohol use No    Review of  Systems Constitutional: Negative for fever. Cardiovascular: Negative for chest pain. Respiratory: Negative for shortness of breath. Gastrointestinal: Negative for abdominal pain, Positive for vomiting, chronic diarrhea Genitourinary: Negative for dysuria. Musculoskeletal: Positive for chronic back pain Skin: Negative for rash. Neurological: Negative for headaches, focal weakness or numbness.  10-point ROS otherwise negative.  ____________________________________________   PHYSICAL EXAM:  VITAL SIGNS: ED Triage Vitals  Enc Vitals Group     BP 10/19/16 0934 (!) 204/76     Pulse Rate 10/19/16 0934 83     Resp 10/19/16 0934 16     Temp 10/19/16 0934 98 F (36.7 C)     Temp Source 10/19/16 0934 Oral     SpO2 10/19/16 0934 100 %     Weight 10/19/16 0934 190 lb (86.2 kg)     Height 10/19/16 0934 5\' 5"  (1.651 m)     Head Circumference --      Peak Flow --      Pain Score 10/19/16 0935 8     Pain Loc --      Pain Edu? --      Excl. in [place]? --    Constitutional: Alert and oriented. Well appearing and in no distress. Eyes: Conjunctivae are normal. PERRL. Normal extraocular movements. ENT   Head: Normocephalic and atraumatic.   Nose: No congestion/rhinnorhea.   Mouth/Throat: Mucous membranes are moist.   Neck: No stridor. Cardiovascular: Normal rate, regular rhythm. Systolic murmur noted Respiratory: Normal respiratory effort without tachypnea nor retractions. Breath sounds are clear and equal bilaterally. No wheezes/rales/rhonchi. Gastrointestinal: Soft and nontender. Normal bowel sounds Musculoskeletal: Nontender with normal range of motion in all extremities. No lower extremity tenderness nor edema. Neurologic:  Normal speech and language. No gross focal neurologic deficits are appreciated.  Skin:  Skin is warm, dry and intact. No rash noted. Psychiatric: Mood and affect are normal. Speech and behavior are normal.  ____________________________________________  ED  COURSE:  Pertinent labs & imaging results that were available during my care of the patient were reviewed by me and considered in my medical decision making (see chart for details). Clinical Course  Patient presents to ER for vomiting which is likely multifactorial. She is in no distress. We will assess with basic labs.  Procedures ____________________________________________   LABS (pertinent positives/negatives)  Labs Reviewed  COMPREHENSIVE METABOLIC PANEL - Abnormal; Notable for the following:       Result Value   Sodium 133 (*)    Chloride 100 (*)    CO2 20 (*)    Glucose, Bld 308 (*)    BUN 58 (*)    Creatinine, Ser 6.45 (*)    Calcium 8.7 (*)    Total Protein 8.6 (*)    AST 70 (*)    ALT 96 (*)    Total Bilirubin 0.2 (*)    GFR calc non Af Amer 7 (*)    GFR calc Af Amer 8 (*)    All other components within normal limits  CBC - Abnormal; Notable for the following:    RBC 3.22 (*)    Hemoglobin 10.6 (*)    HCT 30.9 (*)    RDW 15.1 (*)    All other  components within normal limits  URINALYSIS COMPLETEWITH MICROSCOPIC ([facility] ONLY) - Abnormal; Notable for the following:    Color, Urine YELLOW (*)    APPearance CLEAR (*)    Glucose, UA >500 (*)    Protein, ur >500 (*)    Leukocytes, UA TRACE (*)    Squamous Epithelial / LPF 0-5 (*)    All other components within normal limits  LIPASE, BLOOD  ____________________________________________  FINAL ASSESSMENT AND PLAN  Vomiting, adverse medication reaction, chronic back pain  Plan: Patient with labs as dictated above. Patient has been discussed with the dialysis center, she is cleared to go to dialysis this morning. I have given her pain medicine and antiemetics and will give him a short prescription for same. She is stable for outpatient follow-up.   [name], MD   Note: This dictation was prepared with Dragon dictation. Any transcriptional errors that result from this process are unintentional     [name], MD 10/19/16 1216

## 2016-10-20 DIAGNOSIS — Z992 Dependence on renal dialysis: Secondary | ICD-10-CM | POA: Diagnosis not present

## 2016-10-20 DIAGNOSIS — N186 End stage renal disease: Secondary | ICD-10-CM | POA: Diagnosis not present

## 2016-10-20 DIAGNOSIS — R0789 Other chest pain: Secondary | ICD-10-CM | POA: Diagnosis not present

## 2016-10-20 DIAGNOSIS — I10 Essential (primary) hypertension: Secondary | ICD-10-CM | POA: Diagnosis not present

## 2016-10-21 DIAGNOSIS — N186 End stage renal disease: Secondary | ICD-10-CM | POA: Diagnosis not present

## 2016-10-21 DIAGNOSIS — D509 Iron deficiency anemia, unspecified: Secondary | ICD-10-CM | POA: Diagnosis not present

## 2016-10-21 DIAGNOSIS — Z992 Dependence on renal dialysis: Secondary | ICD-10-CM | POA: Diagnosis not present

## 2016-10-21 DIAGNOSIS — D631 Anemia in chronic kidney disease: Secondary | ICD-10-CM | POA: Diagnosis not present

## 2016-10-23 DIAGNOSIS — N186 End stage renal disease: Secondary | ICD-10-CM | POA: Diagnosis not present

## 2016-10-23 DIAGNOSIS — Z992 Dependence on renal dialysis: Secondary | ICD-10-CM | POA: Diagnosis not present

## 2016-10-23 DIAGNOSIS — D509 Iron deficiency anemia, unspecified: Secondary | ICD-10-CM | POA: Diagnosis not present

## 2016-10-23 DIAGNOSIS — D631 Anemia in chronic kidney disease: Secondary | ICD-10-CM | POA: Diagnosis not present

## 2016-10-26 DIAGNOSIS — D509 Iron deficiency anemia, unspecified: Secondary | ICD-10-CM | POA: Diagnosis not present

## 2016-10-26 DIAGNOSIS — D631 Anemia in chronic kidney disease: Secondary | ICD-10-CM | POA: Diagnosis not present

## 2016-10-26 DIAGNOSIS — Z992 Dependence on renal dialysis: Secondary | ICD-10-CM | POA: Diagnosis not present

## 2016-10-26 DIAGNOSIS — N186 End stage renal disease: Secondary | ICD-10-CM | POA: Diagnosis not present

## 2016-10-28 DIAGNOSIS — D631 Anemia in chronic kidney disease: Secondary | ICD-10-CM | POA: Diagnosis not present

## 2016-10-28 DIAGNOSIS — N186 End stage renal disease: Secondary | ICD-10-CM | POA: Diagnosis not present

## 2016-10-28 DIAGNOSIS — D509 Iron deficiency anemia, unspecified: Secondary | ICD-10-CM | POA: Diagnosis not present

## 2016-10-28 DIAGNOSIS — Z992 Dependence on renal dialysis: Secondary | ICD-10-CM | POA: Diagnosis not present

## 2016-10-29 DIAGNOSIS — H903 Sensorineural hearing loss, bilateral: Secondary | ICD-10-CM | POA: Diagnosis not present

## 2016-10-29 DIAGNOSIS — H9319 Tinnitus, unspecified ear: Secondary | ICD-10-CM | POA: Diagnosis not present

## 2016-10-29 DIAGNOSIS — J301 Allergic rhinitis due to pollen: Secondary | ICD-10-CM | POA: Diagnosis not present

## 2016-10-29 DIAGNOSIS — N912 Amenorrhea, unspecified: Secondary | ICD-10-CM | POA: Diagnosis not present

## 2016-10-29 DIAGNOSIS — H6980 Other specified disorders of Eustachian tube, unspecified ear: Secondary | ICD-10-CM | POA: Diagnosis not present

## 2016-10-30 DIAGNOSIS — D631 Anemia in chronic kidney disease: Secondary | ICD-10-CM | POA: Diagnosis not present

## 2016-10-30 DIAGNOSIS — N186 End stage renal disease: Secondary | ICD-10-CM | POA: Diagnosis not present

## 2016-10-30 DIAGNOSIS — Z992 Dependence on renal dialysis: Secondary | ICD-10-CM | POA: Diagnosis not present

## 2016-10-30 DIAGNOSIS — D509 Iron deficiency anemia, unspecified: Secondary | ICD-10-CM | POA: Diagnosis not present

## 2016-11-02 DIAGNOSIS — D509 Iron deficiency anemia, unspecified: Secondary | ICD-10-CM | POA: Diagnosis not present

## 2016-11-02 DIAGNOSIS — Z992 Dependence on renal dialysis: Secondary | ICD-10-CM | POA: Diagnosis not present

## 2016-11-02 DIAGNOSIS — D631 Anemia in chronic kidney disease: Secondary | ICD-10-CM | POA: Diagnosis not present

## 2016-11-02 DIAGNOSIS — N186 End stage renal disease: Secondary | ICD-10-CM | POA: Diagnosis not present

## 2016-11-04 DIAGNOSIS — D509 Iron deficiency anemia, unspecified: Secondary | ICD-10-CM | POA: Diagnosis not present

## 2016-11-04 DIAGNOSIS — N186 End stage renal disease: Secondary | ICD-10-CM | POA: Diagnosis not present

## 2016-11-04 DIAGNOSIS — D631 Anemia in chronic kidney disease: Secondary | ICD-10-CM | POA: Diagnosis not present

## 2016-11-04 DIAGNOSIS — Z992 Dependence on renal dialysis: Secondary | ICD-10-CM | POA: Diagnosis not present

## 2016-11-05 DIAGNOSIS — G8929 Other chronic pain: Secondary | ICD-10-CM | POA: Diagnosis not present

## 2016-11-05 DIAGNOSIS — Z01818 Encounter for other preprocedural examination: Secondary | ICD-10-CM | POA: Diagnosis not present

## 2016-11-05 DIAGNOSIS — Z794 Long term (current) use of insulin: Secondary | ICD-10-CM | POA: Diagnosis not present

## 2016-11-05 DIAGNOSIS — G629 Polyneuropathy, unspecified: Secondary | ICD-10-CM | POA: Diagnosis not present

## 2016-11-05 DIAGNOSIS — M545 Low back pain: Secondary | ICD-10-CM | POA: Diagnosis not present

## 2016-11-05 DIAGNOSIS — I10 Essential (primary) hypertension: Secondary | ICD-10-CM | POA: Diagnosis not present

## 2016-11-05 DIAGNOSIS — N186 End stage renal disease: Secondary | ICD-10-CM | POA: Diagnosis not present

## 2016-11-05 DIAGNOSIS — Z992 Dependence on renal dialysis: Secondary | ICD-10-CM | POA: Diagnosis not present

## 2016-11-05 DIAGNOSIS — Z720 Tobacco use: Secondary | ICD-10-CM | POA: Diagnosis not present

## 2016-11-05 DIAGNOSIS — E1122 Type 2 diabetes mellitus with diabetic chronic kidney disease: Secondary | ICD-10-CM | POA: Diagnosis not present

## 2016-11-06 DIAGNOSIS — D631 Anemia in chronic kidney disease: Secondary | ICD-10-CM | POA: Diagnosis not present

## 2016-11-06 DIAGNOSIS — D509 Iron deficiency anemia, unspecified: Secondary | ICD-10-CM | POA: Diagnosis not present

## 2016-11-06 DIAGNOSIS — Z992 Dependence on renal dialysis: Secondary | ICD-10-CM | POA: Diagnosis not present

## 2016-11-06 DIAGNOSIS — N186 End stage renal disease: Secondary | ICD-10-CM | POA: Diagnosis not present

## 2016-11-08 DIAGNOSIS — D509 Iron deficiency anemia, unspecified: Secondary | ICD-10-CM | POA: Diagnosis not present

## 2016-11-08 DIAGNOSIS — D631 Anemia in chronic kidney disease: Secondary | ICD-10-CM | POA: Diagnosis not present

## 2016-11-08 DIAGNOSIS — Z992 Dependence on renal dialysis: Secondary | ICD-10-CM | POA: Diagnosis not present

## 2016-11-08 DIAGNOSIS — N186 End stage renal disease: Secondary | ICD-10-CM | POA: Diagnosis not present

## 2016-11-10 DIAGNOSIS — R0789 Other chest pain: Secondary | ICD-10-CM | POA: Diagnosis not present

## 2016-11-11 DIAGNOSIS — N186 End stage renal disease: Secondary | ICD-10-CM | POA: Diagnosis not present

## 2016-11-11 DIAGNOSIS — Z992 Dependence on renal dialysis: Secondary | ICD-10-CM | POA: Diagnosis not present

## 2016-11-11 DIAGNOSIS — D509 Iron deficiency anemia, unspecified: Secondary | ICD-10-CM | POA: Diagnosis not present

## 2016-11-11 DIAGNOSIS — D631 Anemia in chronic kidney disease: Secondary | ICD-10-CM | POA: Diagnosis not present

## 2016-11-14 DIAGNOSIS — D631 Anemia in chronic kidney disease: Secondary | ICD-10-CM | POA: Diagnosis not present

## 2016-11-14 DIAGNOSIS — D509 Iron deficiency anemia, unspecified: Secondary | ICD-10-CM | POA: Diagnosis not present

## 2016-11-14 DIAGNOSIS — Z992 Dependence on renal dialysis: Secondary | ICD-10-CM | POA: Diagnosis not present

## 2016-11-14 DIAGNOSIS — N186 End stage renal disease: Secondary | ICD-10-CM | POA: Diagnosis not present

## 2016-11-16 DIAGNOSIS — F329 Major depressive disorder, single episode, unspecified: Secondary | ICD-10-CM | POA: Diagnosis not present

## 2016-11-16 DIAGNOSIS — F17210 Nicotine dependence, cigarettes, uncomplicated: Secondary | ICD-10-CM | POA: Diagnosis not present

## 2016-11-16 DIAGNOSIS — E113511 Type 2 diabetes mellitus with proliferative diabetic retinopathy with macular edema, right eye: Secondary | ICD-10-CM | POA: Diagnosis not present

## 2016-11-16 DIAGNOSIS — E113591 Type 2 diabetes mellitus with proliferative diabetic retinopathy without macular edema, right eye: Secondary | ICD-10-CM | POA: Diagnosis not present

## 2016-11-16 DIAGNOSIS — I509 Heart failure, unspecified: Secondary | ICD-10-CM | POA: Diagnosis not present

## 2016-11-16 DIAGNOSIS — I1311 Hypertensive heart and chronic kidney disease without heart failure, with stage 5 chronic kidney disease, or end stage renal disease: Secondary | ICD-10-CM | POA: Diagnosis not present

## 2016-11-16 DIAGNOSIS — R161 Splenomegaly, not elsewhere classified: Secondary | ICD-10-CM | POA: Diagnosis not present

## 2016-11-16 DIAGNOSIS — H524 Presbyopia: Secondary | ICD-10-CM | POA: Diagnosis not present

## 2016-11-16 DIAGNOSIS — Z794 Long term (current) use of insulin: Secondary | ICD-10-CM | POA: Diagnosis not present

## 2016-11-16 DIAGNOSIS — N186 End stage renal disease: Secondary | ICD-10-CM | POA: Diagnosis not present

## 2016-11-16 DIAGNOSIS — J329 Chronic sinusitis, unspecified: Secondary | ICD-10-CM | POA: Diagnosis not present

## 2016-11-16 DIAGNOSIS — Z992 Dependence on renal dialysis: Secondary | ICD-10-CM | POA: Diagnosis not present

## 2016-11-16 DIAGNOSIS — H52209 Unspecified astigmatism, unspecified eye: Secondary | ICD-10-CM | POA: Diagnosis not present

## 2016-11-16 DIAGNOSIS — H5210 Myopia, unspecified eye: Secondary | ICD-10-CM | POA: Diagnosis not present

## 2016-11-16 DIAGNOSIS — I209 Angina pectoris, unspecified: Secondary | ICD-10-CM | POA: Diagnosis not present

## 2016-11-16 DIAGNOSIS — H4311 Vitreous hemorrhage, right eye: Secondary | ICD-10-CM | POA: Diagnosis not present

## 2016-11-16 DIAGNOSIS — E1122 Type 2 diabetes mellitus with diabetic chronic kidney disease: Secondary | ICD-10-CM | POA: Diagnosis not present

## 2016-11-18 DIAGNOSIS — N186 End stage renal disease: Secondary | ICD-10-CM | POA: Diagnosis not present

## 2016-11-18 DIAGNOSIS — D631 Anemia in chronic kidney disease: Secondary | ICD-10-CM | POA: Diagnosis not present

## 2016-11-18 DIAGNOSIS — D509 Iron deficiency anemia, unspecified: Secondary | ICD-10-CM | POA: Diagnosis not present

## 2016-11-18 DIAGNOSIS — Z992 Dependence on renal dialysis: Secondary | ICD-10-CM | POA: Diagnosis not present

## 2016-11-19 DIAGNOSIS — N186 End stage renal disease: Secondary | ICD-10-CM | POA: Diagnosis not present

## 2016-11-19 DIAGNOSIS — Z992 Dependence on renal dialysis: Secondary | ICD-10-CM | POA: Diagnosis not present

## 2016-11-20 ENCOUNTER — Emergency Department
Admission: EM | Admit: 2016-11-20 | Discharge: 2016-11-21 | Disposition: A | Discharge status: Home / Self Care | Payer: Medicare Other | Attending: Emergency Medicine | Admitting: Emergency Medicine

## 2016-11-20 ENCOUNTER — Emergency Department: Payer: Medicare Other

## 2016-11-20 ENCOUNTER — Encounter: Payer: Self-pay | Admitting: *Deleted

## 2016-11-20 DIAGNOSIS — R1030 Lower abdominal pain, unspecified: Secondary | ICD-10-CM

## 2016-11-20 DIAGNOSIS — F17210 Nicotine dependence, cigarettes, uncomplicated: Secondary | ICD-10-CM | POA: Diagnosis not present

## 2016-11-20 DIAGNOSIS — Z794 Long term (current) use of insulin: Secondary | ICD-10-CM | POA: Insufficient documentation

## 2016-11-20 DIAGNOSIS — N281 Cyst of kidney, acquired: Secondary | ICD-10-CM | POA: Diagnosis not present

## 2016-11-20 DIAGNOSIS — N186 End stage renal disease: Secondary | ICD-10-CM | POA: Diagnosis not present

## 2016-11-20 DIAGNOSIS — N390 Urinary tract infection, site not specified: Secondary | ICD-10-CM

## 2016-11-20 DIAGNOSIS — Z992 Dependence on renal dialysis: Secondary | ICD-10-CM | POA: Insufficient documentation

## 2016-11-20 DIAGNOSIS — J449 Chronic obstructive pulmonary disease, unspecified: Secondary | ICD-10-CM | POA: Insufficient documentation

## 2016-11-20 DIAGNOSIS — I509 Heart failure, unspecified: Secondary | ICD-10-CM | POA: Diagnosis not present

## 2016-11-20 DIAGNOSIS — D631 Anemia in chronic kidney disease: Secondary | ICD-10-CM | POA: Diagnosis not present

## 2016-11-20 DIAGNOSIS — Z79899 Other long term (current) drug therapy: Secondary | ICD-10-CM | POA: Insufficient documentation

## 2016-11-20 DIAGNOSIS — I132 Hypertensive heart and chronic kidney disease with heart failure and with stage 5 chronic kidney disease, or end stage renal disease: Secondary | ICD-10-CM | POA: Insufficient documentation

## 2016-11-20 DIAGNOSIS — D509 Iron deficiency anemia, unspecified: Secondary | ICD-10-CM | POA: Diagnosis not present

## 2016-11-20 LAB — TYPE AND SCREEN
ABO/RH(D): A POS
Antibody Screen: NEGATIVE

## 2016-11-20 LAB — URINALYSIS COMPLETE WITH MICROSCOPIC (ARMC ONLY)
Bilirubin Urine: NEGATIVE
Glucose, UA: >500 mg/dL — AB
HGB URINE DIPSTICK: NEGATIVE
Ketones, ur: NEGATIVE mg/dL
Nitrite: NEGATIVE
SPECIFIC GRAVITY, URINE: 1.01 (ref 1.005–1.030)
pH: 7 (ref 5.0–8.0)

## 2016-11-20 LAB — CBC
HCT: 34.7 % — ABNORMAL LOW (ref 35.0–47.0)
HEMOGLOBIN: 12.1 g/dL (ref 12.0–16.0)
MCH: 33.8 pg (ref 26.0–34.0)
MCHC: 34.7 g/dL (ref 32.0–36.0)
MCV: 97.2 fL (ref 80.0–100.0)
Platelets: 178 10*3/uL (ref 150–440)
RBC: 3.58 MIL/uL — ABNORMAL LOW (ref 3.80–5.20)
RDW: 14.5 % (ref 11.5–14.5)
WBC: 6 10*3/uL (ref 3.6–11.0)

## 2016-11-20 LAB — COMPREHENSIVE METABOLIC PANEL
ALBUMIN: 4.2 g/dL (ref 3.5–5.0)
ALK PHOS: 70 U/L (ref 38–126)
ALT: 96 U/L — AB (ref 14–54)
AST: 73 U/L — AB (ref 15–41)
Anion gap: 13 (ref 5–15)
BUN: 31 mg/dL — AB (ref 6–20)
CALCIUM: 8.1 mg/dL — AB (ref 8.9–10.3)
CO2: 24 mmol/L (ref 22–32)
CREATININE: 3.33 mg/dL — AB (ref 0.44–1.00)
Chloride: 96 mmol/L — ABNORMAL LOW (ref 101–111)
GFR calc Af Amer: 17 mL/min — ABNORMAL LOW (ref >60)
GFR calc non Af Amer: 15 mL/min — ABNORMAL LOW (ref >60)
GLUCOSE: 176 mg/dL — AB (ref 65–99)
Potassium: 4.7 mmol/L (ref 3.5–5.1)
SODIUM: 133 mmol/L — AB (ref 135–145)
Total Bilirubin: 0.4 mg/dL (ref 0.3–1.2)
Total Protein: 9.4 g/dL — ABNORMAL HIGH (ref 6.5–8.1)

## 2016-11-20 LAB — LIPASE, BLOOD: Lipase: 48 U/L (ref 11–51)

## 2016-11-20 MED ORDER — MORPHINE SULFATE (PF) 4 MG/ML IV SOLN
4.0000 mg | Freq: Once | INTRAVENOUS | Status: AC
  Administered 2016-11-20: 4 mg via INTRAVENOUS
  Filled 2016-11-20: qty 1

## 2016-11-20 MED ORDER — DEXTROSE 5 % IV SOLN: 1.0000 g | Freq: Once | INTRAVENOUS | Status: DC

## 2016-11-20 MED ORDER — CEFTRIAXONE SODIUM-DEXTROSE 1-3.74 GM-% IV SOLR
INTRAVENOUS | Status: AC
  Filled 2016-11-20: qty 50

## 2016-11-20 MED ORDER — TRAMADOL HCL 50 MG PO TABS: 50.0000 mg | ORAL_TABLET | Freq: Four times a day (QID) | ORAL | 0 refills | Status: DC | PRN

## 2016-11-20 MED ORDER — CEPHALEXIN 500 MG PO CAPS: 500.0000 mg | ORAL_CAPSULE | Freq: Three times a day (TID) | ORAL | 0 refills | Status: DC

## 2016-11-20 MED ORDER — CEFTRIAXONE SODIUM-DEXTROSE 1-3.74 GM-% IV SOLR
1.0000 g | Freq: Once | INTRAVENOUS | Status: AC
  Administered 2016-11-20: 1 g via INTRAVENOUS

## 2016-11-20 MED ORDER — IOPAMIDOL (ISOVUE-300) INJECTION 61%
100.0000 mL | Freq: Once | INTRAVENOUS | Status: AC | PRN
  Administered 2016-11-20: 100 mL via INTRAVENOUS

## 2016-11-20 MED ORDER — IOPAMIDOL (ISOVUE-300) INJECTION 61%
30.0000 mL | Freq: Once | INTRAVENOUS | Status: AC | PRN
  Administered 2016-11-20: 30 mL via ORAL

## 2016-11-20 NOTE — ED Provider Notes (Signed)
[facility] Emergency Department Provider Note   ____________________________________________   I have reviewed the triage vital signs and the nursing notes.   HISTORY  Chief Complaint Abdominal Pain and GI Bleeding   History limited by: Not Limited   HPI [name] is a 50 y.o. female with history of end-stage renal disease on dialysis who presents today at the request of the dialysis nurse because of concerns for "internal bleeding". Patient states that she thinks that she has been having abdominal pain. It has been bad for about a week and a half. It is located in the suprapubic region. She has noticed some increase in her incontinence although she has had previous issues with incontinence. Has been some black tarry stool. In addition patient has had some nausea. Patient denies any fevers.   Past Medical History:  Diagnosis Date  . Anemia   . Anxiety   . Arthritis   . CHF (congestive heart failure) ([name])   . Chronic kidney disease   . COPD (chronic obstructive pulmonary disease) ([name])   . Depression   . Diabetes mellitus without complication ([name])   . Emphysema of lung ([name])   . End stage renal disease ([name])   . Hepatitis   . Hypercholesterolemia   . Hypertension   . Tobacco dependence     Patient Active Problem List   Diagnosis Date Noted  . MRSA (methicillin resistant Staphylococcus aureus) infection 07/18/2016  . Status post incision and drainage 07/18/2016  . Chronic ulcer of left foot ([name]) 07/18/2016  . Acquired palmar and plantar hyperkeratosis 07/18/2016  . Elevated troponin 07/18/2016  . Essential hypertension 07/18/2016  . Epistaxis 07/18/2016  . Tobacco abuse 07/18/2016  . ESRD on dialysis ([name]) 07/18/2016  . Orbital cellulitis on right 07/13/2016  . Cellulitis 06/06/2016  . Adjustment disorder with mixed disturbance of emotions and conduct 05/11/2016  . Dysthymia 05/11/2016    Past Surgical History:  Procedure  Laterality Date  . CHOLECYSTECTOMY    . cyst removed  from rt hand    . INCISION AND DRAINAGE ABSCESS N/A 07/16/2016   Procedure: INCISION AND DRAINAGE ABSCESS;  Surgeon: Carloyn Manner, MD;  Location: [facility] ORS;  Service: ENT;  Laterality: N/A;  . PERIPHERAL VASCULAR CATHETERIZATION N/A 07/25/2015   Procedure: A/V Shuntogram/Fistulagram;  Surgeon: Algernon Huxley, MD;  Location: [name];  Service: Cardiovascular;  Laterality: N/A;  . PERIPHERAL VASCULAR CATHETERIZATION Left 07/25/2015   Procedure: A/V Shunt Intervention;  Surgeon: Algernon Huxley, MD;  Location: [name];  Service: Cardiovascular;  Laterality: Left;  . PERIPHERAL VASCULAR CATHETERIZATION Left 10/07/2015   Procedure: A/V Shuntogram/Fistulagram;  Surgeon: Algernon Huxley, MD;  Location: [name];  Service: Cardiovascular;  Laterality: Left;  . PERIPHERAL VASCULAR CATHETERIZATION N/A 10/07/2015   Procedure: A/V Shunt Intervention;  Surgeon: Algernon Huxley, MD;  Location: [name];  Service: Cardiovascular;  Laterality: N/A;  . PERIPHERAL VASCULAR CATHETERIZATION  10/07/2015   Procedure: Dialysis/Perma Catheter Insertion;  Surgeon: Algernon Huxley, MD;  Location: [name];  Service: Cardiovascular;;  . PERIPHERAL VASCULAR CATHETERIZATION N/A 12/17/2015   Procedure: Dialysis/Perma Catheter Removal;  Surgeon: Katha Cabal, MD;  Location: [name];  Service: Cardiovascular;  Laterality: N/A;  . rt. tubal and ovary removed    . TONSILLECTOMY      Prior to Admission medications   Medication Sig Start Date End Date Taking? Authorizing Provider  b complex-vitamin c-folic acid (  NEPHRO-VITE) 0.8 MG TABS tablet Take 1 tablet by mouth daily.    Historical Provider, MD  clobetasol ointment (TEMOVATE) 3.66 % Apply 1 application topically daily.    Historical Provider, MD  diltiazem (CARDIZEM SR) 90 MG 12 hr capsule Take 2 capsules (180 mg total) by mouth 2 (two) times daily. 07/18/16    [name], MD  docusate sodium (COLACE) 100 MG capsule Take 1 capsule (100 mg total) by mouth 2 (two) times daily. 07/18/16   [name], MD  gentamicin ointment (GARAMYCIN) 0.1 % Apply topically 4 (four) times daily. 07/18/16   [name], MD  hydrALAZINE (APRESOLINE) 100 MG tablet Take 100 mg by mouth 2 (two) times daily.    Historical Provider, MD  insulin glargine (LANTUS) 100 UNIT/ML injection Inject 15 Units into the skin at bedtime.     Historical Provider, MD  insulin regular (NOVOLIN R,HUMULIN R) 100 units/mL injection Inject 5-10 Units into the skin 3 (three) times daily before meals. Pt uses per sliding scale.    Historical Provider, MD  menthol-cetylpyridinium (CEPACOL) 3 MG lozenge Take 1 lozenge (3 mg total) by mouth as needed for sore throat. 07/18/16   [name], MD  nicotine polacrilex (NICORETTE) 2 MG gum Take 1 each (2 mg total) by mouth as needed for smoking cessation. 07/18/16   [name], MD  omeprazole (PRILOSEC) 40 MG capsule Take 40 mg by mouth daily.    Historical Provider, MD  oxyCODONE (ROXICODONE) 5 MG immediate release tablet Take 1 tablet (5 mg total) by mouth every 8 (eight) hours as needed. 10/19/16 10/19/17  [name], MD  Oxycodone HCl 10 MG TABS Take 10 mg by mouth 4 (four) times daily as needed (for pain).    Historical Provider, MD  promethazine (PHENERGAN) 25 MG tablet Take 1 tablet (25 mg total) by mouth every 4 (four) hours as needed for nausea or vomiting. 10/19/16   [name], MD  sevelamer carbonate (RENVELA) 800 MG tablet Take 1,600 mg by mouth 3 (three) times daily with meals.    Historical Provider, MD  sodium chloride (OCEAN) 0.65 % SOLN nasal spray Place 1 spray into both nostrils as needed for congestion. 07/18/16   [name], MD  traMADol (ULTRAM) 50 MG tablet Take 1 tablet (50 mg total) by mouth every 6 (six) hours as needed for severe pain. 07/12/16 07/12/17  Nance Pear, MD  venlafaxine XR (EFFEXOR-XR) 75 MG  24 hr capsule Take 150 mg by mouth daily.    Historical Provider, MD    Allergies Cinnamon; Cinoxate; Tylenol [acetaminophen]; Garlic; and Onion  Family History  Problem Relation Age of Onset  . Hypertension Mother   . Heart disease Mother   . Hypertension Father     Social History Social History  Substance Use Topics  . Smoking status: Current Every Day Smoker    Packs/day: 0.50    Years: 17.00    Types: Cigarettes  . Smokeless tobacco: Never Used  . Alcohol use No    Review of Systems  Constitutional: Negative for fever. Cardiovascular: Negative for chest pain. Respiratory: Negative for shortness of breath. Gastrointestinal: Positive for suprapubic pain. Genitourinary: Negative for dysuria. Musculoskeletal: Positive for back pain. Skin: Negative for rash. Neurological: Negative for headaches, focal weakness or numbness.  10-point ROS otherwise negative.  ____________________________________________   PHYSICAL EXAM:  VITAL SIGNS: ED Triage Vitals  Enc Vitals Group     BP 11/20/16 1940 131/79     Pulse Rate 11/20/16 1940  83     Resp 11/20/16 1940 (!) 22     Temp 11/20/16 1940 98.1 F (36.7 C)     Temp Source 11/20/16 1940 Oral     SpO2 11/20/16 1940 99 %     Weight 11/20/16 1941 203 lb 0.7 oz (92.1 kg)     Height 11/20/16 1941 5\' 6"  (1.676 m)     Head Circumference --      Peak Flow --      Pain Score 11/20/16 1941 10   Constitutional: Alert and oriented. Well appearing and in no distress. Eyes: Conjunctivae are normal. Normal extraocular movements. ENT   Head: Normocephalic and atraumatic.   Nose: No congestion/rhinnorhea.   Mouth/Throat: Mucous membranes are moist.   Neck: No stridor. Hematological/Lymphatic/Immunilogical: No cervical lymphadenopathy. Cardiovascular: Normal rate, regular rhythm.  No murmurs, rubs, or gallops. Respiratory: Normal respiratory effort without tachypnea nor retractions. Breath sounds are clear and equal  bilaterally. No wheezes/rales/rhonchi. Gastrointestinal: Soft. No distention. Tender to palpation in the lower abdomen without rebound or guarding.  Genitourinary: Deferred Musculoskeletal: Normal range of motion in all extremities. No lower extremity edema. Neurologic:  Normal speech and language. No gross focal neurologic deficits are appreciated.  Skin:  Skin is warm, dry and intact. No rash noted. Psychiatric: Mood and affect are normal. Speech and behavior are normal. Patient exhibits appropriate insight and judgment.  ____________________________________________    LABS (pertinent positives/negatives)  Labs Reviewed  COMPREHENSIVE METABOLIC PANEL - Abnormal; Notable for the following:       Result Value   Sodium 133 (*)    Chloride 96 (*)    Glucose, Bld 176 (*)    BUN 31 (*)    Creatinine, Ser 3.33 (*)    Calcium 8.1 (*)    Total Protein 9.4 (*)    AST 73 (*)    ALT 96 (*)    GFR calc non Af Amer 15 (*)    GFR calc Af Amer 17 (*)    All other components within normal limits  CBC - Abnormal; Notable for the following:    RBC 3.58 (*)    HCT 34.7 (*)    All other components within normal limits  URINALYSIS COMPLETEWITH MICROSCOPIC ([facility] ONLY) - Abnormal; Notable for the following:    Color, Urine YELLOW (*)    APPearance HAZY (*)    Glucose, UA >500 (*)    Protein, ur >500 (*)    Leukocytes, UA 1+ (*)    Bacteria, UA RARE (*)    Squamous Epithelial / LPF 6-30 (*)    All other components within normal limits  URINE CULTURE  LIPASE, BLOOD  POC OCCULT BLOOD, ED  TYPE AND SCREEN     ____________________________________________   EKG  None  ____________________________________________    RADIOLOGY  CT abd.pel IMPRESSION: 1. No acute abnormality seen to explain the patient's symptoms. 2. Mild splenomegaly. 3. Right renal cyst noted. 4. Scattered aortic atherosclerosis. Mild mural thrombus along the superior mesenteric artery, with likely mild luminal  narrowing.  ____________________________________________   PROCEDURES  Procedures  ____________________________________________   INITIAL IMPRESSION / ASSESSMENT AND PLAN / ED COURSE  Pertinent labs & imaging results that were available during my care of the patient were reviewed by me and considered in my medical decision making (see chart for details).  She presented to the emergency department today with superior pubic abdominal pain. On exam she was tender. CT scan will be obtained to evaluate for any infection, obstruction, hernia.  Additionally will check blood work and urine.  Clinical Course    CT abd/pel without concerning findings. Ua consistent with UTI.  ____________________________________________   FINAL CLINICAL IMPRESSION(S) / ED DIAGNOSES  Final diagnoses:  Lower abdominal pain  Urinary tract infection without hematuria, site unspecified     Note: This dictation was prepared with Dragon dictation. Any transcriptional errors that result from this process are unintentional    Nance Pear, MD 11/20/16 2337

## 2016-11-20 NOTE — Discharge Instructions (Signed)
Please seek medical attention for any high fevers, chest pain, shortness of breath, change in behavior, persistent vomiting, bloody stool or any other new or concerning symptoms.  

## 2016-11-20 NOTE — ED Notes (Signed)
Patient completed both bottles of oral contrast, CT notified.

## 2016-11-20 NOTE — ED Notes (Signed)
Patient transported to CT 

## 2016-11-20 NOTE — ED Notes (Signed)
ED Provider at bedside. 

## 2016-11-20 NOTE — ED Notes (Signed)
Oxygen saturating not coming over to the computer, power cycling the monitor to see if it fixes it.

## 2016-11-20 NOTE — ED Notes (Signed)
Returned from CT.

## 2016-11-20 NOTE — ED Triage Notes (Signed)
Pt c/o lower abdominal pain and sticky, tarry stools. Pt is dialysis pt and is normally incontinent of urine and stool. Pt was told her H & H was low during dialysis today. Pt c/o worsening chronic back pain, nausea and loss of appetite. Pt states her dry weight has been increased recently. Pt has positive thrill and bruit.

## 2016-11-23 DIAGNOSIS — D509 Iron deficiency anemia, unspecified: Secondary | ICD-10-CM | POA: Diagnosis not present

## 2016-11-23 DIAGNOSIS — N186 End stage renal disease: Secondary | ICD-10-CM | POA: Diagnosis not present

## 2016-11-23 DIAGNOSIS — D631 Anemia in chronic kidney disease: Secondary | ICD-10-CM | POA: Diagnosis not present

## 2016-11-23 DIAGNOSIS — Z992 Dependence on renal dialysis: Secondary | ICD-10-CM | POA: Diagnosis not present

## 2016-11-23 LAB — URINE CULTURE: Culture: >=100000 — AB

## 2016-11-25 DIAGNOSIS — D509 Iron deficiency anemia, unspecified: Secondary | ICD-10-CM | POA: Diagnosis not present

## 2016-11-25 DIAGNOSIS — N186 End stage renal disease: Secondary | ICD-10-CM | POA: Diagnosis not present

## 2016-11-25 DIAGNOSIS — D631 Anemia in chronic kidney disease: Secondary | ICD-10-CM | POA: Diagnosis not present

## 2016-11-25 DIAGNOSIS — Z992 Dependence on renal dialysis: Secondary | ICD-10-CM | POA: Diagnosis not present

## 2016-11-26 DIAGNOSIS — M542 Cervicalgia: Secondary | ICD-10-CM | POA: Insufficient documentation

## 2016-11-26 DIAGNOSIS — R262 Difficulty in walking, not elsewhere classified: Secondary | ICD-10-CM | POA: Insufficient documentation

## 2016-11-26 DIAGNOSIS — E1142 Type 2 diabetes mellitus with diabetic polyneuropathy: Secondary | ICD-10-CM | POA: Diagnosis not present

## 2016-11-26 DIAGNOSIS — E1140 Type 2 diabetes mellitus with diabetic neuropathy, unspecified: Secondary | ICD-10-CM | POA: Insufficient documentation

## 2016-11-27 DIAGNOSIS — D631 Anemia in chronic kidney disease: Secondary | ICD-10-CM | POA: Diagnosis not present

## 2016-11-27 DIAGNOSIS — D509 Iron deficiency anemia, unspecified: Secondary | ICD-10-CM | POA: Diagnosis not present

## 2016-11-27 DIAGNOSIS — N186 End stage renal disease: Secondary | ICD-10-CM | POA: Diagnosis not present

## 2016-11-27 DIAGNOSIS — Z992 Dependence on renal dialysis: Secondary | ICD-10-CM | POA: Diagnosis not present

## 2016-11-30 DIAGNOSIS — Z992 Dependence on renal dialysis: Secondary | ICD-10-CM | POA: Diagnosis not present

## 2016-11-30 DIAGNOSIS — D631 Anemia in chronic kidney disease: Secondary | ICD-10-CM | POA: Diagnosis not present

## 2016-11-30 DIAGNOSIS — D509 Iron deficiency anemia, unspecified: Secondary | ICD-10-CM | POA: Diagnosis not present

## 2016-11-30 DIAGNOSIS — N186 End stage renal disease: Secondary | ICD-10-CM | POA: Diagnosis not present

## 2016-12-02 ENCOUNTER — Other Ambulatory Visit: Payer: Self-pay | Admitting: Neurology

## 2016-12-02 DIAGNOSIS — D631 Anemia in chronic kidney disease: Secondary | ICD-10-CM | POA: Diagnosis not present

## 2016-12-02 DIAGNOSIS — Z992 Dependence on renal dialysis: Secondary | ICD-10-CM | POA: Diagnosis not present

## 2016-12-02 DIAGNOSIS — D509 Iron deficiency anemia, unspecified: Secondary | ICD-10-CM | POA: Diagnosis not present

## 2016-12-02 DIAGNOSIS — M542 Cervicalgia: Secondary | ICD-10-CM

## 2016-12-02 DIAGNOSIS — N186 End stage renal disease: Secondary | ICD-10-CM | POA: Diagnosis not present

## 2016-12-04 DIAGNOSIS — Z992 Dependence on renal dialysis: Secondary | ICD-10-CM | POA: Diagnosis not present

## 2016-12-04 DIAGNOSIS — D509 Iron deficiency anemia, unspecified: Secondary | ICD-10-CM | POA: Diagnosis not present

## 2016-12-04 DIAGNOSIS — D631 Anemia in chronic kidney disease: Secondary | ICD-10-CM | POA: Diagnosis not present

## 2016-12-04 DIAGNOSIS — N186 End stage renal disease: Secondary | ICD-10-CM | POA: Diagnosis not present

## 2016-12-07 DIAGNOSIS — Z992 Dependence on renal dialysis: Secondary | ICD-10-CM | POA: Diagnosis not present

## 2016-12-07 DIAGNOSIS — N186 End stage renal disease: Secondary | ICD-10-CM | POA: Diagnosis not present

## 2016-12-07 DIAGNOSIS — D631 Anemia in chronic kidney disease: Secondary | ICD-10-CM | POA: Diagnosis not present

## 2016-12-07 DIAGNOSIS — D509 Iron deficiency anemia, unspecified: Secondary | ICD-10-CM | POA: Diagnosis not present

## 2016-12-09 DIAGNOSIS — N186 End stage renal disease: Secondary | ICD-10-CM | POA: Diagnosis not present

## 2016-12-09 DIAGNOSIS — D509 Iron deficiency anemia, unspecified: Secondary | ICD-10-CM | POA: Diagnosis not present

## 2016-12-09 DIAGNOSIS — D631 Anemia in chronic kidney disease: Secondary | ICD-10-CM | POA: Diagnosis not present

## 2016-12-09 DIAGNOSIS — Z992 Dependence on renal dialysis: Secondary | ICD-10-CM | POA: Diagnosis not present

## 2016-12-11 DIAGNOSIS — D631 Anemia in chronic kidney disease: Secondary | ICD-10-CM | POA: Diagnosis not present

## 2016-12-11 DIAGNOSIS — Z992 Dependence on renal dialysis: Secondary | ICD-10-CM | POA: Diagnosis not present

## 2016-12-11 DIAGNOSIS — D509 Iron deficiency anemia, unspecified: Secondary | ICD-10-CM | POA: Diagnosis not present

## 2016-12-11 DIAGNOSIS — N186 End stage renal disease: Secondary | ICD-10-CM | POA: Diagnosis not present

## 2016-12-13 DIAGNOSIS — D631 Anemia in chronic kidney disease: Secondary | ICD-10-CM | POA: Diagnosis not present

## 2016-12-13 DIAGNOSIS — Z992 Dependence on renal dialysis: Secondary | ICD-10-CM | POA: Diagnosis not present

## 2016-12-13 DIAGNOSIS — N186 End stage renal disease: Secondary | ICD-10-CM | POA: Diagnosis not present

## 2016-12-13 DIAGNOSIS — D509 Iron deficiency anemia, unspecified: Secondary | ICD-10-CM | POA: Diagnosis not present

## 2016-12-15 ENCOUNTER — Ambulatory Visit (INDEPENDENT_AMBULATORY_CARE_PROVIDER_SITE_OTHER): Payer: Medicare Other | Admitting: Gastroenterology

## 2016-12-15 ENCOUNTER — Encounter: Payer: Self-pay | Admitting: Gastroenterology

## 2016-12-15 ENCOUNTER — Other Ambulatory Visit: Payer: Self-pay

## 2016-12-15 VITALS — BP 190/85 | HR 87 | Temp 98.2°F | Ht 66.0 in | Wt 206.2 lb

## 2016-12-15 DIAGNOSIS — B182 Chronic viral hepatitis C: Secondary | ICD-10-CM

## 2016-12-15 DIAGNOSIS — R1084 Generalized abdominal pain: Secondary | ICD-10-CM | POA: Diagnosis not present

## 2016-12-15 DIAGNOSIS — R1310 Dysphagia, unspecified: Secondary | ICD-10-CM | POA: Diagnosis not present

## 2016-12-15 DIAGNOSIS — I8290 Acute embolism and thrombosis of unspecified vein: Secondary | ICD-10-CM | POA: Diagnosis not present

## 2016-12-15 DIAGNOSIS — R1319 Other dysphagia: Secondary | ICD-10-CM

## 2016-12-15 DIAGNOSIS — R197 Diarrhea, unspecified: Secondary | ICD-10-CM

## 2016-12-15 MED ORDER — RANITIDINE HCL 150 MG PO TABS: 150.0000 mg | ORAL_TABLET | Freq: Two times a day (BID) | ORAL | 1 refills | Status: DC

## 2016-12-15 NOTE — Addendum Note (Signed)
Addended byGlennie Isle E on: 12/15/2016 10:06 AM   Modules accepted: Orders

## 2016-12-15 NOTE — Progress Notes (Signed)
Gastroenterology Consultation  Referring Provider:     Josephine Cables, MD Primary Care Physician:  Tula Nakayama, MD Primary Gastroenterologist:  Dr. Jonathon Bellows  Reason for Consultation:     Abdominal pain         HPI:   [name] is a 50 y.o. y/o female referred for consultation & management  by Dr. Mancel Bale, Druscilla Brownie, MD.    She was seen at the ER on 11/20/16 when she was referred at the request of her dialysis nurse for "internal bleeding". At that time was having abdominal pain for about 10 days in the suprapubic region . She also had some nausea, possibly some tarry black stool. She underwent a CT scan of her abdomen that showed mild splenomegaly , aortic atherosclerosis , mild mural thrombus along SMA with likely luminal narrowing.She was treated for an UTI.    At that visit her Hb was 12.1 which is higher than her prior in 09/2016 at 10/6 . Urine culture was positive for E coli .    She says she used to be seen at [facility] and per prior notes was seen in 02/2016 for chronic diarrhea , hepatitis C , dysphagia. She has been on oxycodone for 5 years prescribed for back pain. She was suggested an EGD+colonoscopy but could not get it done soon by GI and hence wishes to transfer care.  Abdominal pain: Onset: years, all over her abdomen , when she eats, usually occurs as she is eating , then she needs to rush to the rest room, has a bowel movement , still has pain after the bowel movement  Nature of pain: sharp  Aggravating factors: eating  Relieving factors :nothing  Weight loss: no  NSAID use: no  PPI use :omeprazole once in a blue moon,  Gall bladder surgery: cholecystectomy 2006 for a "different kind of pain"  Diarrhea :  Onset: years    Number of bowel movements a day : takes imodium daily , range from 4-5 times to none, says she has a bowel movement every 3 days.Last night was her last bowel movement     Color : "normal brown"  Consistency:  Mashed  potatoes   Shape of stool:  normal   Weight loss:  None  Prior colonoscopy:  Never had one   Artificial sugars/sodas/chewing gum:  Uses sweet and low sometimes    Bloating:  Yes   Gas:  Yes , not related to diarrhea Antibiotic use: no   Dysphagia: Onset and any progression: years  Frequency: once a week, center of her chest  Foods affected : solids -more affects meat  Prior episodes of impaction: no  History of asthma/allergy : no  History of heartburn/Reflux : no  Weight loss : no   Prior EGD: no  PPI/H2 blocker use : occasional use.   Hepatitis C  Diagnosed in 2003- last used cocaine 5 years back , has had professional tatoos, has been incarcerated, no Armed forces logistics/support/administrative officer.Not been treated in the past . Does not drink alcohol presently , no excess use in the past . No family history of liver disease.       Past Medical History:  Diagnosis Date  . Anemia   . Anxiety   . Arthritis   . CHF (congestive heart failure) ([place])   . Chronic kidney disease   . COPD (chronic obstructive pulmonary disease) ([place])   . Depression   . Diabetes mellitus without complication ([place])   .  Emphysema of lung ([place])   . End stage renal disease ([place])   . Hepatitis   . Hypercholesterolemia   . Hypertension   . Tobacco dependence     Past Surgical History:  Procedure Laterality Date  . CHOLECYSTECTOMY    . cyst removed  from rt hand    . INCISION AND DRAINAGE ABSCESS N/A 07/16/2016   Procedure: INCISION AND DRAINAGE ABSCESS;  Surgeon: Carloyn Manner, MD;  Location: [facility];  Service: ENT;  Laterality: N/A;  . PERIPHERAL VASCULAR CATHETERIZATION N/A 07/25/2015   Procedure: A/V Shuntogram/Fistulagram;  Surgeon: Algernon Huxley, MD;  Location: [facility];  Service: Cardiovascular;  Laterality: N/A;  . PERIPHERAL VASCULAR CATHETERIZATION Left 07/25/2015   Procedure: A/V Shunt Intervention;  Surgeon: Algernon Huxley, MD;  Location: [facility];  Service: Cardiovascular;  Laterality: Left;    . PERIPHERAL VASCULAR CATHETERIZATION Left 10/07/2015   Procedure: A/V Shuntogram/Fistulagram;  Surgeon: Algernon Huxley, MD;  Location: [facility];  Service: Cardiovascular;  Laterality: Left;  . PERIPHERAL VASCULAR CATHETERIZATION N/A 10/07/2015   Procedure: A/V Shunt Intervention;  Surgeon: Algernon Huxley, MD;  Location: [facility];  Service: Cardiovascular;  Laterality: N/A;  . PERIPHERAL VASCULAR CATHETERIZATION  10/07/2015   Procedure: Dialysis/Perma Catheter Insertion;  Surgeon: Algernon Huxley, MD;  Location: [facility];  Service: Cardiovascular;;  . PERIPHERAL VASCULAR CATHETERIZATION N/A 12/17/2015   Procedure: Dialysis/Perma Catheter Removal;  Surgeon: Katha Cabal, MD;  Location: [facility];  Service: Cardiovascular;  Laterality: N/A;  . rt. tubal and ovary removed    . TONSILLECTOMY      Prior to Admission medications   Medication Sig Start Date End Date Taking? Authorizing Provider  b complex-vitamin c-folic acid (NEPHRO-VITE) 0.8 MG TABS tablet Take 1 tablet by mouth daily.    Historical Provider, MD  BD SAFETYGLIDE INSULIN SYRINGE 29G X 1/2" 0.5 ML MISC  11/26/16   Historical Provider, MD  calcium acetate (PHOSLO) 667 MG capsule  11/26/16   Historical Provider, MD  clobetasol ointment (TEMOVATE) 7.86 % Apply 1 application topically daily.    Historical Provider, MD  cyclopentolate (CYCLODRYL,CYCLOGYL) 1 % ophthalmic solution Administer 1 drop to the right eye Two (2) times a day.    Historical Provider, MD  diltiazem (CARDIZEM SR) 120 MG 12 hr capsule  12/03/16   Historical Provider, MD  diltiazem (CARDIZEM SR) 90 MG 12 hr capsule Take 2 capsules (180 mg total) by mouth 2 (two) times daily. 07/18/16   [name], MD  docusate sodium (COLACE) 100 MG capsule Take 1 capsule (100 mg total) by mouth 2 (two) times daily. 07/18/16   [name], MD  gentamicin ointment (GARAMYCIN) 0.1 % Apply topically 4 (four) times daily. 07/18/16   [name],  MD  hydrALAZINE (APRESOLINE) 100 MG tablet Take 100 mg by mouth 2 (two) times daily.    Historical Provider, MD  insulin glargine (LANTUS) 100 UNIT/ML injection Inject 15 Units into the skin at bedtime.     Historical Provider, MD  insulin regular (NOVOLIN R,HUMULIN R) 100 units/mL injection Inject 5-10 Units into the skin 3 (three) times daily before meals. Pt uses per sliding scale.    Historical Provider, MD  loperamide (IMODIUM A-D) 2 MG tablet Take by mouth.    Historical Provider, MD  metoprolol tartrate (LOPRESSOR) 25 MG tablet  09/17/16   Historical Provider, MD  moxifloxacin (VIGAMOX) 0.5 % ophthalmic solution Administer 1 drop to the  right eye Four (4) times a day.    Historical Provider, MD  nicotine polacrilex (NICORETTE) 2 MG gum Take 1 each (2 mg total) by mouth as needed for smoking cessation. 07/18/16   [name], MD  omeprazole (PRILOSEC) 40 MG capsule Take 40 mg by mouth daily.    Historical Provider, MD  oxyCODONE (ROXICODONE) 5 MG immediate release tablet Take 1 tablet (5 mg total) by mouth every 8 (eight) hours as needed. 10/19/16 10/19/17  [name], MD  Oxycodone HCl 10 MG TABS Take 10 mg by mouth 4 (four) times daily as needed (for pain).    Historical Provider, MD  promethazine (PHENERGAN) 25 MG tablet Take 1 tablet (25 mg total) by mouth every 4 (four) hours as needed for nausea or vomiting. 10/19/16   [name], MD  sevelamer carbonate (RENVELA) 800 MG tablet Take 1,600 mg by mouth 3 (three) times daily with meals.    Historical Provider, MD  sodium chloride (OCEAN) 0.65 % SOLN nasal spray Place 1 spray into both nostrils as needed for congestion. 07/18/16   [name], MD  traMADol (ULTRAM) 50 MG tablet Take 1 tablet (50 mg total) by mouth every 6 (six) hours as needed for severe pain. 07/12/16 07/12/17  Nance Pear, MD  traMADol (ULTRAM) 50 MG tablet Take 1 tablet (50 mg total) by mouth every 6 (six) hours as needed. 11/20/16 11/20/17  Nance Pear, MD  venlafaxine XR (EFFEXOR-XR) 75 MG 24 hr capsule Take 150 mg by mouth daily.    Historical Provider, MD    Family History  Problem Relation Age of Onset  . Hypertension Mother   . Heart disease Mother   . Hypertension Father      Social History  Substance Use Topics  . Smoking status: Current Every Day Smoker    Packs/day: 0.50    Years: 17.00    Types: Cigarettes  . Smokeless tobacco: Never Used  . Alcohol use No    Allergies as of 12/15/2016 - Review Complete 12/15/2016  Allergen Reaction Noted  . Cinnamon Anaphylaxis 10/11/2015  . Cinoxate Anaphylaxis 10/11/2015  . Tylenol [acetaminophen] Anaphylaxis 07/25/2015  . Ciprofloxacin Diarrhea 11/05/2016  . Garlic Hives 25/42/7062  . Onion Hives and Swelling 10/11/2015    Review of Systems:    All systems reviewed and negative except where noted in HPI.   Physical Exam:  BP (!) 190/85   Pulse 87   Temp 98.2 F (36.8 C) (Oral)   Ht 5\' 6"  (1.676 m)   Wt 206 lb 3.2 oz (93.5 kg)   BMI 33.28 kg/m  No LMP recorded. Patient is postmenopausal. Psych:  Alert and cooperative. Normal mood and affect. General:   Alert,  Well-developed, well-nourished, pleasant and cooperative in NAD Head:  Normocephalic and atraumatic. Eyes:  Sclera clear, no icterus.   Conjunctiva pink. Ears:  Normal auditory acuity. Nose:  No deformity, discharge, or lesions. Mouth:  No deformity or lesions,oropharynx pink & moist. Neck:  Supple; no masses or thyromegaly. Lungs:  Respirations even and unlabored.  Clear throughout to auscultation.   No wheezes, crackles, or rhonchi. No acute distress. Heart:  Regular rate and rhythm; no murmurs, clicks, rubs, or gallops. Abdomen:  Normal bowel sounds.  No bruits.  Soft, non-tender and non-distended without masses, hepatosplenomegaly or hernias noted.  No guarding or rebound tenderness.    Msk:  Symmetrical without gross deformities. Good, equal movement & strength bilaterally. Pulses:  Normal  pulses noted. Extremities:  No  clubbing or edema.  No cyanosis. Neurologic:  Alert and oriented x3;  grossly normal neurologically. Skin:  Intact without significant lesions or rashes. No jaundice. Lymph Nodes:  No significant cervical adenopathy. Psych:  Alert and cooperative. Normal mood and affect.  Imaging Studies: Ct Abdomen Pelvis W Contrast  Result Date: 11/20/2016 CLINICAL DATA:  Acute onset of lower abdominal pain and sticky tarry stools. Incontinence. Chronic back pain, nausea and loss of appetite. Initial encounter. EXAM: CT ABDOMEN AND PELVIS WITH CONTRAST TECHNIQUE: Multidetector CT imaging of the abdomen and pelvis was performed using the standard protocol following bolus administration of intravenous contrast. CONTRAST:  187mL ISOVUE-300 IOPAMIDOL (ISOVUE-300) INJECTION 61% COMPARISON:  CT of the abdomen and pelvis from 12/30/2012, and right upper quadrant ultrasound performed 12/05/2012 FINDINGS: Lower chest: The visualized lung bases are grossly clear. The visualized portions of the mediastinum are unremarkable. Hepatobiliary: The liver is unremarkable in appearance. Minimal calcification is noted about the inferior tip of the liver. The patient is status post cholecystectomy, with clips noted at the gallbladder fossa. The common bile duct remains normal in caliber. Pancreas: The pancreas is within normal limits. Spleen: The spleen is mildly enlarged, measuring 13.2 cm in length. Adrenals/Urinary Tract: The adrenal glands are grossly unremarkable. Nonspecific perinephric stranding is noted bilaterally. A right renal cyst is seen. There is no evidence of hydronephrosis. No renal or ureteral stones are identified. Stomach/Bowel: The stomach is unremarkable in appearance. The small bowel is within normal limits. The appendix is normal in caliber, without evidence of appendicitis. The colon is unremarkable in appearance. Vascular/Lymphatic: Scattered calcification is seen along the abdominal  aorta and its branches. There is mild associated mural thrombus along the superior mesenteric artery, with likely mild luminal narrowing. The inferior vena cava is grossly unremarkable. No retroperitoneal lymphadenopathy is seen. No pelvic sidewall lymphadenopathy is identified. Reproductive: The bladder is mildly distended and within normal limits. The uterus is grossly unremarkable in appearance. The ovaries are relatively symmetric. No suspicious adnexal masses are seen. Other: No additional soft tissue abnormalities are seen. Musculoskeletal: No acute osseous abnormalities are identified. The visualized musculature is unremarkable in appearance. IMPRESSION: 1. No acute abnormality seen to explain the patient's symptoms. 2. Mild splenomegaly. 3. Right renal cyst noted. 4. Scattered aortic atherosclerosis. Mild mural thrombus along the superior mesenteric artery, with likely mild luminal narrowing. Electronically Signed   By: Garald Balding M.D.   On: 11/20/2016 23:06    Assessment and Plan:   [name] is a 50 y.o. y/o female has been referred for abdominal pain. When she came in she had multiple issues which we needed to discuss,   1. Abdominal pain : Long standing , post prandial , ?related to chronic opiod use. Plan for EGD, commence on Zantac BID  2. Mural thrombus in SMA with post prandial pain seen incidentally on recent CT scan and not had a follow up - suggest vascular surgery consult  3. Dysphagia- empirically treat for reflux and evaluate for stricture with EGD  4. Diarrhea/constipation - likely secondary to opiod use will perform colonoscopy after cardiac clearance.   5. Hepatitis C- Treatment naive on dialysis- will obtain lab work and then discuss treatment options. Will obtain Fibrosure to evaluate for cirrhosis. Urine toxicology screen.   I have discussed alternative options, risks & benefits,  which include, but are not limited to, bleeding, infection, perforation,respiratory  complication & drug reaction.  The patient agrees with this plan & written consent will be obtained.  Follow up in 2 months.   Dr Jonathon Bellows MD

## 2016-12-15 NOTE — Patient Instructions (Addendum)
Hepatitis C Hepatitis C is a viral infection of the liver. It can lead to scarring of the liver (cirrhosis), liver failure, or liver cancer. Hepatitis C may go undetected for months or years because people with the infection may not have symptoms, or they may have only mild symptoms. What are the causes? Hepatitis C is caused by the hepatitis C virus (HCV). The virus can be passed from one person to another through:  Blood.  Contaminated needles, such as those used for tattooing, body piercing, acupuncture, or injecting drugs.  Having unprotected sex with an infected person.  Childbirth.  Blood transfusions or organ transplants done in the [place] before 1992. What increases the risk? Risk factors for hepatitis C include:  Having unprotected sex with an infected person.  Using illegal drugs. What are the signs or symptoms? Symptoms of hepatitis C may include:  Fatigue.  Loss of appetite.  Nausea.  Vomiting.  Abdominal pain.  Dark yellow urine.  Yellowish skin and eyes (jaundice).  Itching of the skin.  Clay-colored bowel movements.  Joint pain. Symptoms are not always present. How is this diagnosed? Hepatitis C is diagnosed with blood tests. Other types of tests may also be done to check how your liver is functioning. How is this treated? Your health care provider may perform noninvasive tests or a liver biopsy to help determine the best course of treatment. Treatment for hepatitis C may include one or more medicines. Your health care provider may check you for a recurring infection or other liver conditions every 6-12 months after treatment. Follow these instructions at home:  Rest as needed.  Take all medicines as directed by your health care provider.  Do not take any medicine unless approved by your health care provider. This includes over-the-counter medicine and birth control pills.  Do not drink alcohol.  Do not have sex until approved by your  health care provider.  Do not share toothbrushes, nail clippers, razors, or needles. How is this prevented? There is no vaccine for hepatitis C. The only way to prevent the disease is to reduce the risk of exposure to the virus. This may be done by:  Practicing safe sex and using condoms.  Avoiding illegal drugs. Contact a health care provider if:  You have a fever.  You develop abdominal pain.  You develop dark urine.  You have clay-colored bowel movements.  You develop joint pains. Get help right away if:  You have increasing fatigue or weakness.  You lose your appetite.  You feel nauseous or vomit.  You develop jaundice or your jaundice gets worse.  You bruise or bleed easily. This information is not intended to replace advice given to you by your health care provider. Make sure you discuss any questions you have with your health care provider. Document Released: 12/04/2000 Document Revised: 05/14/2016 Document Reviewed: 03/21/2014 Elsevier Interactive Patient Education  2017 [employer].

## 2016-12-16 ENCOUNTER — Other Ambulatory Visit
Admission: RE | Admit: 2016-12-16 | Discharge: 2016-12-16 | Disposition: A | Discharge status: Home / Self Care | Payer: Medicare Other | Source: Ambulatory Visit | Attending: Gastroenterology | Admitting: Gastroenterology

## 2016-12-16 DIAGNOSIS — B182 Chronic viral hepatitis C: Secondary | ICD-10-CM | POA: Diagnosis not present

## 2016-12-16 DIAGNOSIS — I8290 Acute embolism and thrombosis of unspecified vein: Secondary | ICD-10-CM | POA: Insufficient documentation

## 2016-12-16 DIAGNOSIS — R1084 Generalized abdominal pain: Secondary | ICD-10-CM | POA: Insufficient documentation

## 2016-12-16 DIAGNOSIS — Z79891 Long term (current) use of opiate analgesic: Secondary | ICD-10-CM | POA: Diagnosis not present

## 2016-12-16 DIAGNOSIS — Z79899 Other long term (current) drug therapy: Secondary | ICD-10-CM | POA: Diagnosis not present

## 2016-12-16 LAB — HEPATIC FUNCTION PANEL
ALT: 87 U/L — ABNORMAL HIGH (ref 14–54)
AST: 65 U/L — ABNORMAL HIGH (ref 15–41)
Albumin: 4.2 g/dL (ref 3.5–5.0)
Alkaline Phosphatase: 71 U/L (ref 38–126)
Total Bilirubin: 0.4 mg/dL (ref 0.3–1.2)
Total Protein: 9.1 g/dL — ABNORMAL HIGH (ref 6.5–8.1)

## 2016-12-16 LAB — URINE DRUG SCREEN, QUALITATIVE (ARMC ONLY)
Amphetamines, Ur Screen: NOT DETECTED
BARBITURATES, UR SCREEN: NOT DETECTED
Benzodiazepine, Ur Scrn: NOT DETECTED
CANNABINOID 50 NG, UR ~~LOC~~: NOT DETECTED
COCAINE METABOLITE, UR ~~LOC~~: NOT DETECTED
MDMA (Ecstasy)Ur Screen: NOT DETECTED
Methadone Scn, Ur: NOT DETECTED
Opiate, Ur Screen: NOT DETECTED
Phencyclidine (PCP) Ur S: NOT DETECTED
TRICYCLIC, UR SCREEN: NOT DETECTED

## 2016-12-16 LAB — C-REACTIVE PROTEIN

## 2016-12-16 LAB — TSH: TSH: 1.182 u[IU]/mL (ref 0.350–4.500)

## 2016-12-16 LAB — PROTIME-INR
INR: 1.04
Prothrombin Time: 13.6 seconds (ref 11.4–15.2)

## 2016-12-17 ENCOUNTER — Ambulatory Visit
Admission: RE | Admit: 2016-12-17 | Discharge: 2016-12-17 | Disposition: A | Discharge status: Home / Self Care | Payer: Medicare Other | Source: Ambulatory Visit | Attending: Neurology | Admitting: Neurology

## 2016-12-17 DIAGNOSIS — M542 Cervicalgia: Secondary | ICD-10-CM

## 2016-12-17 DIAGNOSIS — M50221 Other cervical disc displacement at C4-C5 level: Secondary | ICD-10-CM | POA: Insufficient documentation

## 2016-12-17 DIAGNOSIS — M4802 Spinal stenosis, cervical region: Secondary | ICD-10-CM | POA: Diagnosis not present

## 2016-12-17 LAB — HEPATITIS C ANTIBODY: HCV Ab: >11 s/co ratio — ABNORMAL HIGH (ref 0.0–0.9)

## 2016-12-17 LAB — HEPATITIS A ANTIBODY, TOTAL: HEP A TOTAL AB: NEGATIVE

## 2016-12-17 LAB — HEPATITIS B E ANTIBODY: HEP B E AB: NEGATIVE

## 2016-12-17 LAB — HEPATITIS B CORE ANTIBODY, TOTAL: HEP B C TOTAL AB: NEGATIVE

## 2016-12-17 LAB — HEPATITIS B SURFACE ANTIGEN: Hepatitis B Surface Ag: NEGATIVE

## 2016-12-17 LAB — HEPATITIS B SURFACE ANTIBODY, QUANTITATIVE: Hepatitis B-Post: 12.1 m[IU]/mL (ref >9.9)

## 2016-12-18 ENCOUNTER — Ambulatory Visit (INDEPENDENT_AMBULATORY_CARE_PROVIDER_SITE_OTHER): Payer: Medicare Other | Admitting: Vascular Surgery

## 2016-12-18 ENCOUNTER — Encounter (INDEPENDENT_AMBULATORY_CARE_PROVIDER_SITE_OTHER): Payer: Self-pay | Admitting: Vascular Surgery

## 2016-12-18 ENCOUNTER — Other Ambulatory Visit
Admission: RE | Admit: 2016-12-18 | Discharge: 2016-12-18 | Disposition: A | Discharge status: Home / Self Care | Payer: Medicare Other | Source: Ambulatory Visit | Attending: Gastroenterology | Admitting: Gastroenterology

## 2016-12-18 VITALS — BP 188/90 | HR 95 | Resp 17

## 2016-12-18 DIAGNOSIS — D509 Iron deficiency anemia, unspecified: Secondary | ICD-10-CM | POA: Diagnosis not present

## 2016-12-18 DIAGNOSIS — D631 Anemia in chronic kidney disease: Secondary | ICD-10-CM | POA: Diagnosis not present

## 2016-12-18 DIAGNOSIS — E118 Type 2 diabetes mellitus with unspecified complications: Secondary | ICD-10-CM | POA: Diagnosis not present

## 2016-12-18 DIAGNOSIS — I10 Essential (primary) hypertension: Secondary | ICD-10-CM

## 2016-12-18 DIAGNOSIS — E1165 Type 2 diabetes mellitus with hyperglycemia: Secondary | ICD-10-CM

## 2016-12-18 DIAGNOSIS — E1122 Type 2 diabetes mellitus with diabetic chronic kidney disease: Secondary | ICD-10-CM | POA: Insufficient documentation

## 2016-12-18 DIAGNOSIS — N186 End stage renal disease: Secondary | ICD-10-CM

## 2016-12-18 DIAGNOSIS — Z992 Dependence on renal dialysis: Secondary | ICD-10-CM

## 2016-12-18 DIAGNOSIS — I771 Stricture of artery: Secondary | ICD-10-CM | POA: Diagnosis not present

## 2016-12-18 DIAGNOSIS — Z720 Tobacco use: Secondary | ICD-10-CM

## 2016-12-18 DIAGNOSIS — K551 Chronic vascular disorders of intestine: Secondary | ICD-10-CM | POA: Insufficient documentation

## 2016-12-18 LAB — CELIAC DISEASE PANEL
Endomysial Ab, IgA: NEGATIVE
IgA: 228 mg/dL (ref 87–352)
Tissue Transglutaminase Ab, IgA: <2 U/mL (ref 0–3)

## 2016-12-18 NOTE — Assessment & Plan Note (Signed)
blood pressure control important in reducing the progression of atherosclerotic disease. On appropriate oral medications.  

## 2016-12-18 NOTE — Progress Notes (Signed)
MRN : [id number]  [name] is a 50 y.o. ([birth year]) female who presents with chief complaint of  Chief Complaint  Patient presents with  . Follow-up  .  History of Present Illness: Patient returns today in follow up For a new problem at the request of a gastroenterologist. She needs an endoscopy as part of her transplant workup, and her gastroenterologist that she could not have one until her vascular system was further interrogated. She had been complaining of abdominal pain, food fear, and some diarrhea for many months. This is intermittent, but severe. Nothing in particular seemed to bring it on and nothing has really improved this. She reports no inciting event or causative factor. She denies fever or chills. She had a CT scan performed which I have independently reviewed. Although no clear cause of her abdominal symptoms was seen, there was abnormality within the superior mesenteric artery. Although this was somewhat difficult to characterize on this not CT angiogram, some atherosclerotic plaque and potentially some mural thrombus present. The determination of flow limitation was difficult to discern from this study. Also of note, the patient's left brachiocephalic A-V fistula is working well for dialysis. We placed this in performed interventions for this in the past. This has not required any therapy for over a year.  Current Outpatient Prescriptions  Medication Sig Dispense Refill  . b complex-vitamin c-folic acid (NEPHRO-VITE) 0.8 MG TABS tablet Take 1 tablet by mouth daily.    . BD SAFETYGLIDE INSULIN SYRINGE 29G X 1/2" 0.5 ML MISC     . calcium acetate (PHOSLO) 667 MG capsule     . clobetasol ointment (TEMOVATE) 7.25 % Apply 1 application topically daily.    . cyclopentolate (CYCLODRYL,CYCLOGYL) 1 % ophthalmic solution Administer 1 drop to the right eye Two (2) times a day.    . diltiazem (CARDIZEM SR) 120 MG 12 hr capsule     . diltiazem (CARDIZEM SR) 90 MG 12 hr capsule  Take 2 capsules (180 mg total) by mouth 2 (two) times daily. 60 capsule 6  . hydrALAZINE (APRESOLINE) 100 MG tablet Take 100 mg by mouth 2 (two) times daily.    . insulin glargine (LANTUS) 100 UNIT/ML injection Inject 15 Units into the skin at bedtime.     . insulin regular (NOVOLIN R,HUMULIN R) 100 units/mL injection Inject 5-10 Units into the skin 3 (three) times daily before meals. Pt uses per sliding scale.    . loperamide (IMODIUM A-D) 2 MG tablet Take by mouth.    . promethazine (PHENERGAN) 25 MG tablet Take 1 tablet (25 mg total) by mouth every 4 (four) hours as needed for nausea or vomiting. 30 tablet 1  . ranitidine (ZANTAC) 150 MG tablet Take 1 tablet (150 mg total) by mouth 2 (two) times daily. 60 tablet 1  . sevelamer carbonate (RENVELA) 800 MG tablet Take 1,600 mg by mouth 3 (three) times daily with meals.    . docusate sodium (COLACE) 100 MG capsule Take 1 capsule (100 mg total) by mouth 2 (two) times daily. (Patient not taking: Reported on 12/18/2016) 10 capsule 0  . gentamicin ointment (GARAMYCIN) 0.1 % Apply topically 4 (four) times daily. (Patient not taking: Reported on 12/18/2016) 15 g 0  . metoprolol tartrate (LOPRESSOR) 25 MG tablet     . moxifloxacin (VIGAMOX) 0.5 % ophthalmic solution Administer 1 drop to the right eye Four (4) times a day.    . nicotine polacrilex (NICORETTE) 2 MG gum Take 1 each (2 mg  total) by mouth as needed for smoking cessation. (Patient not taking: Reported on 12/18/2016) 100 tablet 0  . omeprazole (PRILOSEC) 40 MG capsule Take 40 mg by mouth daily.    Marland Kitchen oxyCODONE (ROXICODONE) 5 MG immediate release tablet Take 1 tablet (5 mg total) by mouth every 8 (eight) hours as needed. (Patient not taking: Reported on 12/18/2016) 20 tablet 0  . Oxycodone HCl 10 MG TABS Take 10 mg by mouth 4 (four) times daily as needed (for pain).    . sodium chloride (OCEAN) 0.65 % SOLN nasal spray Place 1 spray into both nostrils as needed for congestion. (Patient not taking:  Reported on 12/18/2016) 1 Bottle 5  . traMADol (ULTRAM) 50 MG tablet Take 1 tablet (50 mg total) by mouth every 6 (six) hours as needed for severe pain. (Patient not taking: Reported on 12/18/2016) 20 tablet 0  . traMADol (ULTRAM) 50 MG tablet Take 1 tablet (50 mg total) by mouth every 6 (six) hours as needed. (Patient not taking: Reported on 12/18/2016) 15 tablet 0  . venlafaxine XR (EFFEXOR-XR) 75 MG 24 hr capsule Take 150 mg by mouth daily.     No current facility-administered medications for this visit.     Past Medical History:  Diagnosis Date  . Anemia   . Anxiety   . Arthritis   . CHF (congestive heart failure) (HCC)   . Chronic kidney disease   . COPD (chronic obstructive pulmonary disease) (HCC)   . Depression   . Diabetes mellitus without complication (HCC)   . Emphysema of lung (HCC)   . End stage renal disease (HCC)   . Hepatitis   . Hypercholesterolemia   . Hypertension   . Tobacco dependence     Past Surgical History:  Procedure Laterality Date  . CHOLECYSTECTOMY    . cyst removed  from rt hand    . INCISION AND DRAINAGE ABSCESS N/A 07/16/2016   Procedure: INCISION AND DRAINAGE ABSCESS;  Surgeon: Bud Face, MD;  Location: [facility];  Service: ENT;  Laterality: N/A;  . PERIPHERAL VASCULAR CATHETERIZATION N/A 07/25/2015   Procedure: A/V Shuntogram/Fistulagram;  Surgeon: Annice Needy, MD;  Location: [facility];  Service: Cardiovascular;  Laterality: N/A;  . PERIPHERAL VASCULAR CATHETERIZATION Left 07/25/2015   Procedure: A/V Shunt Intervention;  Surgeon: Annice Needy, MD;  Location: [facility];  Service: Cardiovascular;  Laterality: Left;  . PERIPHERAL VASCULAR CATHETERIZATION Left 10/07/2015   Procedure: A/V Shuntogram/Fistulagram;  Surgeon: Annice Needy, MD;  Location: [facility];  Service: Cardiovascular;  Laterality: Left;  . PERIPHERAL VASCULAR CATHETERIZATION N/A 10/07/2015   Procedure: A/V Shunt Intervention;  Surgeon: Annice Needy,  MD;  Location: [facility];  Service: Cardiovascular;  Laterality: N/A;  . PERIPHERAL VASCULAR CATHETERIZATION  10/07/2015   Procedure: Dialysis/Perma Catheter Insertion;  Surgeon: Annice Needy, MD;  Location: [facility];  Service: Cardiovascular;;  . PERIPHERAL VASCULAR CATHETERIZATION N/A 12/17/2015   Procedure: Dialysis/Perma Catheter Removal;  Surgeon: Renford Dills, MD;  Location: [facility];  Service: Cardiovascular;  Laterality: N/A;  . rt. tubal and ovary removed    . TONSILLECTOMY      Social History Social History  Substance Use Topics  . Smoking status: Current Every Day Smoker    Packs/day: 0.50    Years: 17.00    Types: Cigarettes  . Smokeless tobacco: Never Used  . Alcohol use No  No IV drug use  Family History Family  History  Problem Relation Age of Onset  . Hypertension Mother   . Heart disease Mother   . Hypertension Father   No bleeding or clotting disorders  Allergies  Allergen Reactions  . Cinnamon Anaphylaxis  . Cinoxate Anaphylaxis  . Tylenol [Acetaminophen] Anaphylaxis  . Ciprofloxacin Diarrhea  . Garlic Hives  . Onion Hives and Swelling     REVIEW OF SYSTEMS (Negative unless checked)  Constitutional: '[]'$ Weight loss  '[]'$ Fever  '[]'$ Chills Cardiac: '[]'$ Chest pain   '[]'$ Chest pressure   '[x]'$ Palpitations   '[]'$ Shortness of breath when laying flat   '[]'$ Shortness of breath at rest   '[]'$ Shortness of breath with exertion. Vascular:  '[]'$ Pain in legs with walking   '[]'$ Pain in legs at rest   '[]'$ Pain in legs when laying flat   '[]'$ Claudication   '[]'$ Pain in feet when walking  '[]'$ Pain in feet at rest  '[]'$ Pain in feet when laying flat   '[]'$ History of DVT   '[]'$ Phlebitis   '[x]'$ Swelling in legs   '[]'$ Varicose veins   '[]'$ Non-healing ulcers Pulmonary:   '[]'$ Uses home oxygen   '[]'$ Productive cough   '[]'$ Hemoptysis   '[]'$ Wheeze  '[]'$ COPD   '[]'$ Asthma Neurologic:  '[]'$ Dizziness  '[]'$ Blackouts   '[]'$ Seizures   '[]'$ History of stroke   '[]'$ History of TIA  '[]'$ Aphasia   '[]'$ Temporary blindness    '[]'$ Dysphagia   '[]'$ Weakness or numbness in arms   '[]'$ Weakness or numbness in legs Musculoskeletal:  '[]'$ Arthritis   '[]'$ Joint swelling   '[]'$ Joint pain   '[]'$ Low back pain Hematologic:  '[]'$ Easy bruising  '[]'$ Easy bleeding   '[]'$ Hypercoagulable state   '[]'$ Anemic   Gastrointestinal:  '[]'$ Blood in stool   '[]'$ Vomiting blood  '[]'$ Gastroesophageal reflux/heartburn   '[x]'$ Abdominal pain Genitourinary:  '[x]'$ Chronic kidney disease   '[]'$ Difficult urination  '[]'$ Frequent urination  '[]'$ Burning with urination   '[]'$ Hematuria Skin:  '[]'$ Rashes   '[]'$ Ulcers   '[]'$ Wounds Psychological:  '[]'$ History of anxiety   '[]'$  History of major depression.  Physical Examination  BP (!) 188/90   Pulse 95   Resp 17  Gen:  WD/WN, NAD Head: [place]/AT, No temporalis wasting. Ear/Nose/Throat: Hearing grossly intact, nares w/o erythema or drainage, trachea midline Eyes: Conjunctiva clear. Sclera non-icteric Neck: Supple.  No JVD.  Pulmonary:  Good air movement, no use of accessory muscles.  Cardiac: RRR, normal S1, S2 Vascular: Thrill and bruit are present in left arm AV fistula Vessel Right Left  Radial Palpable Palpable                                   Gastrointestinal: soft, non-tender/non-distended. No guarding/reflex.  Musculoskeletal: M/S 5/5 throughout.  No deformity or atrophy. Mild lower extremity edema Neurologic: Sensation grossly intact in extremities.  Symmetrical.  Speech is fluent.  Psychiatric: Judgment intact, Mood & affect appropriate for pt's clinical situation. Dermatologic: No rashes or ulcers noted.  No cellulitis or open wounds. Lymph : No Cervical, Axillary, or Inguinal lymphadenopathy.      Labs Recent Results (from the past 2160 hour(s))  Lipase, blood     Status: None   Collection Time: 10/19/16  9:40 AM  Result Value Ref Range   Lipase 28 11 - 51 U/L  Comprehensive metabolic panel     Status: Abnormal   Collection Time: 10/19/16  9:40 AM  Result Value Ref Range   Sodium 133 (L) 135 - 145 mmol/L   Potassium 4.4 3.5  - 5.1 mmol/L   Chloride 100 (L) 101 - 111 mmol/L  CO2 20 (L) 22 - 32 mmol/L   Glucose, Bld 308 (H) 65 - 99 mg/dL   BUN 58 (H) 6 - 20 mg/dL   Creatinine, Ser 6.45 (H) 0.44 - 1.00 mg/dL   Calcium 8.7 (L) 8.9 - 10.3 mg/dL   Total Protein 8.6 (H) 6.5 - 8.1 g/dL   Albumin 3.9 3.5 - 5.0 g/dL   AST 70 (H) 15 - 41 U/L   ALT 96 (H) 14 - 54 U/L   Alkaline Phosphatase 78 38 - 126 U/L   Total Bilirubin 0.2 (L) 0.3 - 1.2 mg/dL   GFR calc non Af Amer 7 (L) >60 mL/min   GFR calc Af Amer 8 (L) >60 mL/min    Comment: (NOTE) The eGFR has been calculated using the CKD EPI equation. This calculation has not been validated in all clinical situations. eGFR's persistently <60 mL/min signify possible Chronic Kidney Disease.    Anion gap 13 5 - 15  CBC     Status: Abnormal   Collection Time: 10/19/16  9:40 AM  Result Value Ref Range   WBC 5.8 3.6 - 11.0 K/uL   RBC 3.22 (L) 3.80 - 5.20 MIL/uL   Hemoglobin 10.6 (L) 12.0 - 16.0 g/dL   HCT 30.9 (L) 35.0 - 47.0 %   MCV 95.7 80.0 - 100.0 fL   MCH 33.0 26.0 - 34.0 pg   MCHC 34.5 32.0 - 36.0 g/dL   RDW 15.1 (H) 11.5 - 14.5 %   Platelets 175 150 - 440 K/uL  Urinalysis complete, with microscopic     Status: Abnormal   Collection Time: 10/19/16  9:42 AM  Result Value Ref Range   Color, Urine YELLOW (A) YELLOW   APPearance CLEAR (A) CLEAR   Glucose, UA >500 (A) NEGATIVE mg/dL   Bilirubin Urine NEGATIVE NEGATIVE   Ketones, ur NEGATIVE NEGATIVE mg/dL   Specific Gravity, Urine 1.011 1.005 - 1.030   Hgb urine dipstick NEGATIVE NEGATIVE   pH 7.0 5.0 - 8.0   Protein, ur >500 (A) NEGATIVE mg/dL   Nitrite NEGATIVE NEGATIVE   Leukocytes, UA TRACE (A) NEGATIVE   RBC / HPF 0-5 0 - 5 RBC/hpf   WBC, UA 6-30 0 - 5 WBC/hpf   Bacteria, UA NONE SEEN NONE SEEN   Squamous Epithelial / LPF 0-5 (A) NONE SEEN  Comprehensive metabolic panel     Status: Abnormal   Collection Time: 11/20/16  7:56 PM  Result Value Ref Range   Sodium 133 (L) 135 - 145 mmol/L   Potassium  4.7 3.5 - 5.1 mmol/L   Chloride 96 (L) 101 - 111 mmol/L   CO2 24 22 - 32 mmol/L   Glucose, Bld 176 (H) 65 - 99 mg/dL   BUN 31 (H) 6 - 20 mg/dL   Creatinine, Ser 3.33 (H) 0.44 - 1.00 mg/dL   Calcium 8.1 (L) 8.9 - 10.3 mg/dL   Total Protein 9.4 (H) 6.5 - 8.1 g/dL   Albumin 4.2 3.5 - 5.0 g/dL   AST 73 (H) 15 - 41 U/L   ALT 96 (H) 14 - 54 U/L   Alkaline Phosphatase 70 38 - 126 U/L   Total Bilirubin 0.4 0.3 - 1.2 mg/dL   GFR calc non Af Amer 15 (L) >60 mL/min   GFR calc Af Amer 17 (L) >60 mL/min    Comment: (NOTE) The eGFR has been calculated using the CKD EPI equation. This calculation has not been validated in all clinical situations. eGFR's persistently <60 mL/min signify  possible Chronic Kidney Disease.    Anion gap 13 5 - 15  CBC     Status: Abnormal   Collection Time: 11/20/16  7:56 PM  Result Value Ref Range   WBC 6.0 3.6 - 11.0 K/uL   RBC 3.58 (L) 3.80 - 5.20 MIL/uL   Hemoglobin 12.1 12.0 - 16.0 g/dL   HCT 70.9 (L) 64.3 - 83.8 %   MCV 97.2 80.0 - 100.0 fL   MCH 33.8 26.0 - 34.0 pg   MCHC 34.7 32.0 - 36.0 g/dL   RDW 18.4 03.7 - 54.3 %   Platelets 178 150 - 440 K/uL  Type and screen [facility]     Status: None   Collection Time: 11/20/16  7:56 PM  Result Value Ref Range   ABO/RH(D) A POS    Antibody Screen NEG    Sample Expiration 11/23/2016   Lipase, blood     Status: None   Collection Time: 11/20/16  7:56 PM  Result Value Ref Range   Lipase 48 11 - 51 U/L  Urinalysis complete, with microscopic     Status: Abnormal   Collection Time: 11/20/16  7:56 PM  Result Value Ref Range   Color, Urine YELLOW (A) YELLOW   APPearance HAZY (A) CLEAR   Glucose, UA >500 (A) NEGATIVE mg/dL   Bilirubin Urine NEGATIVE NEGATIVE   Ketones, ur NEGATIVE NEGATIVE mg/dL   Specific Gravity, Urine 1.010 1.005 - 1.030   Hgb urine dipstick NEGATIVE NEGATIVE   pH 7.0 5.0 - 8.0   Protein, ur >500 (A) NEGATIVE mg/dL   Nitrite NEGATIVE NEGATIVE   Leukocytes, UA 1+ (A)  NEGATIVE   RBC / HPF 0-5 0 - 5 RBC/hpf   WBC, UA 6-30 0 - 5 WBC/hpf   Bacteria, UA RARE (A) NONE SEEN   Squamous Epithelial / LPF 6-30 (A) NONE SEEN  Urine culture     Status: Abnormal   Collection Time: 11/20/16  7:56 PM  Result Value Ref Range   Specimen Description URINE, CLEAN CATCH    Special Requests NONE    Culture >=100,000 COLONIES/mL ESCHERICHIA COLI (A)    Report Status 11/23/2016 FINAL    Organism ID, Bacteria ESCHERICHIA COLI (A)       Susceptibility   Escherichia coli - MIC*    AMPICILLIN >=32 RESISTANT Resistant     CEFAZOLIN <=4 SENSITIVE Sensitive     CEFTRIAXONE <=1 SENSITIVE Sensitive     CIPROFLOXACIN >=4 RESISTANT Resistant     GENTAMICIN <=1 SENSITIVE Sensitive     IMIPENEM <=0.25 SENSITIVE Sensitive     NITROFURANTOIN <=16 SENSITIVE Sensitive     TRIMETH/SULFA >=320 RESISTANT Resistant     AMPICILLIN/SULBACTAM 16 INTERMEDIATE Intermediate     PIP/TAZO <=4 SENSITIVE Sensitive     Extended ESBL NEGATIVE Sensitive     * >=100,000 COLONIES/mL ESCHERICHIA COLI  Urine Drug Screen, Qualitative ([facility] only)     Status: None   Collection Time: 12/16/16  1:00 PM  Result Value Ref Range   Tricyclic, Ur Screen NONE DETECTED NONE DETECTED   Amphetamines, Ur Screen NONE DETECTED NONE DETECTED   MDMA (Ecstasy)Ur Screen NONE DETECTED NONE DETECTED   Cocaine Metabolite,Ur [place] NONE DETECTED NONE DETECTED   Opiate, Ur Screen NONE DETECTED NONE DETECTED   Phencyclidine (PCP) Ur S NONE DETECTED NONE DETECTED   Cannabinoid 50 Ng, Ur [place] NONE DETECTED NONE DETECTED   Barbiturates, Ur Screen NONE DETECTED NONE DETECTED   Benzodiazepine, Ur Scrn NONE DETECTED NONE  DETECTED   Methadone Scn, Ur NONE DETECTED NONE DETECTED    Comment: (NOTE) 026  Tricyclics, urine               Cutoff 1000 ng/mL 200  Amphetamines, urine             Cutoff 1000 ng/mL 300  MDMA (Ecstasy), urine           Cutoff 500 ng/mL 400  Cocaine Metabolite, urine       Cutoff 300 ng/mL 500  Opiate, urine                    Cutoff 300 ng/mL 600  Phencyclidine (PCP), urine      Cutoff 25 ng/mL 700  Cannabinoid, urine              Cutoff 50 ng/mL 800  Barbiturates, urine             Cutoff 200 ng/mL 900  Benzodiazepine, urine           Cutoff 200 ng/mL 1000 Methadone, urine                Cutoff 300 ng/mL 1100 1200 The urine drug screen provides only a preliminary, unconfirmed 1300 analytical test result and should not be used for non-medical 1400 purposes. Clinical consideration and professional judgment should 1500 be applied to any positive drug screen result due to possible 1600 interfering substances. A more specific alternate chemical method 1700 must be used in order to obtain a confirmed analytical result.  1800 Gas chromato graphy / mass spectrometry (GC/MS) is the preferred 1900 confirmatory method.   TSH     Status: None   Collection Time: 12/16/16  1:21 PM  Result Value Ref Range   TSH 1.182 0.350 - 4.500 uIU/mL    Comment: Performed by a 3rd Generation assay with a functional sensitivity of <=0.01 uIU/mL.  Celiac Disease Panel     Status: None   Collection Time: 12/16/16  1:21 PM  Result Value Ref Range   Endomysial Ab, IgA Negative Negative   Tissue Transglutaminase Ab, IgA <2 0 - 3 U/mL    Comment: (NOTE)                              Negative        0 -  3                              Weak Positive   4 - 10                              Positive           >10 Tissue Transglutaminase (tTG) has been identified as the endomysial antigen.  Studies have demonstr- ated that endomysial IgA antibodies have over 99% specificity for gluten sensitive enteropathy.    IgA 228 87 - 352 mg/dL    Comment: (NOTE) Performed At: [facility] [place], [place] [id number] Lindon Romp MD DX:[id number]   C-reactive protein     Status: None   Collection Time: 12/16/16  1:21 PM  Result Value Ref Range   CRP <0.8 <1.0 mg/dL    Comment: Performed at [facility]  Hepatitis B e antibody     Status: None  Collection Time: 12/16/16  1:21 PM  Result Value Ref Range   Hep B E Ab Negative Negative    Comment: (NOTE) Performed At: [facility] [address] [place], [place] [id number] Lindon Romp MD UU:[id number]   Hepatic function panel     Status: Abnormal   Collection Time: 12/16/16  1:21 PM  Result Value Ref Range   Total Protein 9.1 (H) 6.5 - 8.1 g/dL   Albumin 4.2 3.5 - 5.0 g/dL   AST 65 (H) 15 - 41 U/L   ALT 87 (H) 14 - 54 U/L   Alkaline Phosphatase 71 38 - 126 U/L   Total Bilirubin 0.4 0.3 - 1.2 mg/dL   Bilirubin, Direct <0.1 (L) 0.1 - 0.5 mg/dL   Indirect Bilirubin NOT CALCULATED 0.3 - 0.9 mg/dL  Hepatitis A antibody, total     Status: None   Collection Time: 12/16/16  1:21 PM  Result Value Ref Range   Hep A Total Ab Negative Negative    Comment: (NOTE) Performed At: [facility] [address] [place], [place] [id number] Lindon Romp MD YI:[id number]   Hepatitis B core antibody, total     Status: None   Collection Time: 12/16/16  1:21 PM  Result Value Ref Range   Hep B Core Total Ab Negative Negative    Comment: (NOTE) Performed At: [facility] [place], [place] [id number] Lindon Romp MD BE:[id number]   Hepatitis B surface antibody     Status: None   Collection Time: 12/16/16  1:21 PM  Result Value Ref Range   Hepatitis B-Post 12.1 Immunity>9.9 mIU/mL    Comment: (NOTE)  Status of Immunity                     Anti-HBs Level  ------------------                     -------------- Inconsistent with Immunity                   0.0 - 9.9 Consistent with Immunity                          >9.9 Performed At: [facility] [place], [place] [id number] Lindon Romp MD YM:[id number]   Hepatitis B surface antigen     Status: None   Collection Time: 12/16/16  1:21 PM  Result Value Ref Range   Hepatitis B Surface Ag Negative Negative     Comment: (NOTE) Performed At: [facility] [place], [place] [id number] Lindon Romp MD PR:[id number]   Hepatitis C antibody     Status: Abnormal   Collection Time: 12/16/16  1:21 PM  Result Value Ref Range   HCV Ab >11.0 (H) 0.0 - 0.9 s/co ratio    Comment: (NOTE)                                  Negative:     < 0.8                             Indeterminate: 0.8 - 0.9  Positive:     > 0.9 The CDC recommends that a positive HCV antibody result be followed up with a HCV Nucleic Acid Amplification test (113451). Performed At: [facility] [address] [place], [place] [id number] Mila Homer MD JI:[id number]   Protime-INR     Status: None   Collection Time: 12/16/16  1:21 PM  Result Value Ref Range   Prothrombin Time 13.6 11.4 - 15.2 seconds   INR 1.04     Radiology Mr Cervical Spine Wo Contrast  Result Date: 12/17/2016 CLINICAL DATA:  Recurrent headache EXAM: MRI CERVICAL SPINE WITHOUT CONTRAST TECHNIQUE: Multiplanar, multisequence MR imaging of the cervical spine was performed. No intravenous contrast was administered. COMPARISON:  None. FINDINGS: Alignment: Physiologic. Vertebrae: No fracture, evidence of discitis, or bone lesion. Cord: Normal signal and morphology. Posterior Fossa, vertebral arteries, paraspinal tissues: Negative. Disc levels: Discs: Mild degenerative disc disease and disc height loss at C5-6. C2-3: No significant disc bulge. No neural foraminal stenosis. No central canal stenosis. C3-4: No significant disc bulge. No neural foraminal stenosis. No central canal stenosis. C4-5: Right foraminal disc protrusion with moderate stenosis. No neural foraminal stenosis. No central canal stenosis. C5-6: Broad central disc protrusion mildly impressing on the ventral cervical spinal cord. Moderate spinal stenosis. No neural foraminal stenosis. C6-7: Mild broad-based disc bulge. No neural foraminal stenosis.  No central canal stenosis. C7-T1: No significant disc bulge. No neural foraminal stenosis. No central canal stenosis. IMPRESSION: 1. At C4-5 there is a right foraminal disc protrusion with moderate foraminal stenosis. 2. At C5-6 there is a broad central disc protrusion mildly impressing on the ventral cervical spinal cord. Moderate spinal stenosis. Electronically Signed   By: Elige Ko   On: 12/17/2016 12:36   Ct Abdomen Pelvis W Contrast  Result Date: 11/20/2016 CLINICAL DATA:  Acute onset of lower abdominal pain and sticky tarry stools. Incontinence. Chronic back pain, nausea and loss of appetite. Initial encounter. EXAM: CT ABDOMEN AND PELVIS WITH CONTRAST TECHNIQUE: Multidetector CT imaging of the abdomen and pelvis was performed using the standard protocol following bolus administration of intravenous contrast. CONTRAST:  ISOVUE-300 IOPAMIDOL (ISOVUE-300) INJECTION 61% COMPARISON:  CT of the abdomen and pelvis from 12/30/2012, and right upper quadrant ultrasound performed 12/05/2012 FINDINGS: Lower chest: The visualized lung bases are grossly clear. The visualized portions of the mediastinum are unremarkable. Hepatobiliary: The liver is unremarkable in appearance. Minimal calcification is noted about the inferior tip of the liver. The patient is status post cholecystectomy, with clips noted at the gallbladder fossa. The common bile duct remains normal in caliber. Pancreas: The pancreas is within normal limits. Spleen: The spleen is mildly enlarged, measuring 13.2 cm in length. Adrenals/Urinary Tract: The adrenal glands are grossly unremarkable. Nonspecific perinephric stranding is noted bilaterally. A right renal cyst is seen. There is no evidence of hydronephrosis. No renal or ureteral stones are identified. Stomach/Bowel: The stomach is unremarkable in appearance. The small bowel is within normal limits. The appendix is normal in caliber, without evidence of appendicitis. The colon is  unremarkable in appearance. Vascular/Lymphatic: Scattered calcification is seen along the abdominal aorta and its branches. There is mild associated mural thrombus along the superior mesenteric artery, with likely mild luminal narrowing. The inferior vena cava is grossly unremarkable. No retroperitoneal lymphadenopathy is seen. No pelvic sidewall lymphadenopathy is identified. Reproductive: The bladder is mildly distended and within normal limits. The uterus is grossly unremarkable in appearance. The ovaries are relatively symmetric. No suspicious adnexal masses are seen. Other: No  additional soft tissue abnormalities are seen. Musculoskeletal: No acute osseous abnormalities are identified. The visualized musculature is unremarkable in appearance. IMPRESSION: 1. No acute abnormality seen to explain the patient's symptoms. 2. Mild splenomegaly. 3. Right renal cyst noted. 4. Scattered aortic atherosclerosis. Mild mural thrombus along the superior mesenteric artery, with likely mild luminal narrowing. Electronically Signed   By: Garald Balding M.D.   On: 11/20/2016 23:06     Assessment/Plan  Tobacco abuse Smoking cessation recommended  Essential hypertension blood pressure control important in reducing the progression of atherosclerotic disease. On appropriate oral medications.   Diabetes ([place]) blood glucose control important in reducing the progression of atherosclerotic disease. Also, involved in wound healing. On appropriate medications.   ESRD on dialysis [facility]) Access is currently working well. No current need for invasive therapy or further evaluation.  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 Although no clear cause of her abdominal symptoms was seen, there was abnormality within the superior mesenteric artery. Although this was somewhat difficult to characterize on this not CT angiogram, some atherosclerotic plaque and potentially some mural  thrombus present. The determination of flow limitation was difficult to discern from this study. It is not entirely clear if this may be contributed factor to her abdominal symptoms, but I do believe this needs further characterization. I had a long discussion about the importance of the SMA and of chronic mesenteric ischemia. We will obtain a mesenteric duplex at the patient's convenience in the near future. I will see her back following that study to discuss the results and determine further treatment options.    Leotis Pain, MD  12/18/2016 3:18 PM    This note was created with Dragon medical transcription system.  Any errors from dictation are purely unintentional

## 2016-12-18 NOTE — Assessment & Plan Note (Signed)
Access is currently working well. No current need for invasive therapy or further evaluation.

## 2016-12-18 NOTE — Patient Instructions (Signed)
Chronic Mesenteric Ischemia Mesenteric ischemia is a deficiency of blood in an area of the intestine supplied by an artery that supports the intestine. Chronic mesenteric ischemia, also called intestinal angina, is a long-term condition. It happens when an artery or vein that supports the intestine gradually becomes blocked or narrow, restricting the blood supply to the intestine. When the blood supply to the intestine is severely restricted, the intestines cannot function properly because needed oxygen cannot reach them.  CAUSES   Fatty deposits that build up in an artery or vein but have not yet restricted blood flow entirely.  Differences in some people's anatomy.  Rapid weight loss.  Weakened areas in blood vessel walls (aneurysms).  Swelling and inflammation of blood vessels (such as from fibromuscular dysplasia and arteritis).  Disorders of blood clotting.  Scarring and fibrosis of blood vessels after radiation therapy.  Blood vessel problems after drug use, such as use of cocaine. RISK FACTORS  Being female.  Being over age 61 with a history of coronary or vascular disease.  Smoking.  Congestive heart failure.  Diabetes.  High cholesterol.  High blood pressure (hypertension). SIGNS AND SYMPTOMS   Severe stomachache. Some people become fearful of eating because of pain.   Abdominal pain or cramps that develop about 30 minutes after a meal.   Abdominal pain after eating that becomes worse over time.   Diarrhea.   Nausea.   Vomiting.   Bloating.   Weight loss. DIAGNOSIS  Chronic mesenteric ischemia is often diagnosed after the person's history is taken, a physical exam is done, and tests are taken. Tests may include:  Ultrasounds.  CT scans.  Angiography. This is an imaging test that uses a dye to obtain a picture of blood flow to the intestine.  Endoscopy. This involves putting a scope through the mouth, down the throat, and into the stomach and  intestine to view the intestinal wall and take small tissue samples (biopsies).  Tonometry. In this test a tiny probe is passed through the mouth and into the stomach or intestine and left in place for 24 hours or more. It measures the output of carbon dioxide by the affected tissues. TREATMENT  Treatment may include:   Medicines to reduce blood clotting and increase blood flow.   Surgery to remove the blockage, repair arteries or veins, and restore blood flow. This may involve:   Angioplasty. This is surgery to widen the affected artery, reduce the blockage, and sometimes insert a small, mesh tube (stent).   Bypass surgery. This may be performed to bypass the blockage and reconnect healthy arteries or veins.   A stent in the affected area to help keep blocked arteries open. HOME CARE INSTRUCTIONS  Only take over-the-counter or prescription medicines as directed by your health care provider.   Keep all follow-up appointments as directed by your health care provider.   Prevent the condition from occurring by:  Doing regular exercise.  Keeping a healthy weight.  Keeping a healthy diet.  Managing cholesterol levels.  Keeping blood pressure and heart rhythm problems under control.  Not smoking. SEEK IMMEDIATE MEDICAL CARE IF:  You have severe abdominal pain.   You notice blood in your stool.   You have nausea, vomiting, or diarrhea.   You have a fever. MAKE SURE YOU:  Understand these instructions.  Will watch your condition.  Will get help right away if you are not doing well or get worse. This information is not intended to replace advice given to  you by your health care provider. Make sure you discuss any questions you have with your health care provider. Document Released: 07/27/2011 Document Revised: 08/09/2013 Document Reviewed: 06/07/2013 Elsevier Interactive Patient Education  2017 [employer].

## 2016-12-18 NOTE — Assessment & Plan Note (Signed)
Smoking cessation recommended 

## 2016-12-18 NOTE — Assessment & Plan Note (Signed)
She had a CT scan performed which I have independently reviewed. Although no clear cause of her abdominal symptoms was seen, there was abnormality within the superior mesenteric artery. Although this was somewhat difficult to characterize on this not CT angiogram, some atherosclerotic plaque and potentially some mural thrombus present. The determination of flow limitation was difficult to discern from this study. It is not entirely clear if this may be contributed factor to her abdominal symptoms, but I do believe this needs further characterization. I had a long discussion about the importance of the SMA and of chronic mesenteric ischemia. We will obtain a mesenteric duplex at the patient's convenience in the near future. I will see her back following that study to discuss the results and determine further treatment options.

## 2016-12-18 NOTE — Assessment & Plan Note (Signed)
blood glucose control important in reducing the progression of atherosclerotic disease. Also, involved in wound healing. On appropriate medications.  

## 2016-12-20 DIAGNOSIS — Z992 Dependence on renal dialysis: Secondary | ICD-10-CM | POA: Diagnosis not present

## 2016-12-20 DIAGNOSIS — N186 End stage renal disease: Secondary | ICD-10-CM | POA: Diagnosis not present

## 2016-12-21 ENCOUNTER — Emergency Department
Admission: EM | Admit: 2016-12-21 | Discharge: 2016-12-21 | Disposition: A | Discharge status: Home / Self Care | Payer: Medicare Other | Attending: Emergency Medicine | Admitting: Emergency Medicine

## 2016-12-21 ENCOUNTER — Emergency Department: Payer: Medicare Other

## 2016-12-21 DIAGNOSIS — Z794 Long term (current) use of insulin: Secondary | ICD-10-CM | POA: Diagnosis not present

## 2016-12-21 DIAGNOSIS — J811 Chronic pulmonary edema: Secondary | ICD-10-CM | POA: Insufficient documentation

## 2016-12-21 DIAGNOSIS — I509 Heart failure, unspecified: Secondary | ICD-10-CM | POA: Diagnosis not present

## 2016-12-21 DIAGNOSIS — R109 Unspecified abdominal pain: Secondary | ICD-10-CM

## 2016-12-21 DIAGNOSIS — H15001 Unspecified scleritis, right eye: Secondary | ICD-10-CM | POA: Insufficient documentation

## 2016-12-21 DIAGNOSIS — N186 End stage renal disease: Secondary | ICD-10-CM | POA: Insufficient documentation

## 2016-12-21 DIAGNOSIS — D631 Anemia in chronic kidney disease: Secondary | ICD-10-CM | POA: Diagnosis not present

## 2016-12-21 DIAGNOSIS — Z992 Dependence on renal dialysis: Secondary | ICD-10-CM | POA: Insufficient documentation

## 2016-12-21 DIAGNOSIS — J449 Chronic obstructive pulmonary disease, unspecified: Secondary | ICD-10-CM | POA: Insufficient documentation

## 2016-12-21 DIAGNOSIS — E875 Hyperkalemia: Secondary | ICD-10-CM | POA: Diagnosis not present

## 2016-12-21 DIAGNOSIS — R1084 Generalized abdominal pain: Secondary | ICD-10-CM | POA: Insufficient documentation

## 2016-12-21 DIAGNOSIS — R0602 Shortness of breath: Secondary | ICD-10-CM | POA: Diagnosis not present

## 2016-12-21 DIAGNOSIS — G8929 Other chronic pain: Secondary | ICD-10-CM | POA: Insufficient documentation

## 2016-12-21 DIAGNOSIS — I132 Hypertensive heart and chronic kidney disease with heart failure and with stage 5 chronic kidney disease, or end stage renal disease: Secondary | ICD-10-CM | POA: Diagnosis not present

## 2016-12-21 DIAGNOSIS — F17210 Nicotine dependence, cigarettes, uncomplicated: Secondary | ICD-10-CM | POA: Insufficient documentation

## 2016-12-21 DIAGNOSIS — Z79899 Other long term (current) drug therapy: Secondary | ICD-10-CM | POA: Insufficient documentation

## 2016-12-21 DIAGNOSIS — D509 Iron deficiency anemia, unspecified: Secondary | ICD-10-CM | POA: Diagnosis not present

## 2016-12-21 DIAGNOSIS — R05 Cough: Secondary | ICD-10-CM | POA: Diagnosis not present

## 2016-12-21 LAB — CBC
HCT: 35.8 % (ref 35.0–47.0)
Hemoglobin: 12.2 g/dL (ref 12.0–16.0)
MCH: 32.7 pg (ref 26.0–34.0)
MCHC: 34 g/dL (ref 32.0–36.0)
MCV: 96.1 fL (ref 80.0–100.0)
PLATELETS: 144 10*3/uL — AB (ref 150–440)
RBC: 3.72 MIL/uL — ABNORMAL LOW (ref 3.80–5.20)
RDW: 13.5 % (ref 11.5–14.5)
WBC: 6.5 10*3/uL (ref 3.6–11.0)

## 2016-12-21 LAB — TROPONIN I: TROPONIN I: 0.04 ng/mL — AB (ref <0.03)

## 2016-12-21 LAB — BASIC METABOLIC PANEL
Anion gap: 13 (ref 5–15)
BUN: 103 mg/dL — AB (ref 6–20)
CALCIUM: 8 mg/dL — AB (ref 8.9–10.3)
CO2: 18 mmol/L — ABNORMAL LOW (ref 22–32)
CREATININE: 8 mg/dL — AB (ref 0.44–1.00)
Chloride: 105 mmol/L (ref 101–111)
GFR calc Af Amer: 6 mL/min — ABNORMAL LOW (ref >60)
GFR, EST NON AFRICAN AMERICAN: 5 mL/min — AB (ref >60)
GLUCOSE: 98 mg/dL (ref 65–99)
Potassium: 6.1 mmol/L — ABNORMAL HIGH (ref 3.5–5.1)
SODIUM: 136 mmol/L (ref 135–145)

## 2016-12-21 LAB — H. PYLORI ANTIGEN, STOOL: H. Pylori Stool Ag, Eia: NEGATIVE

## 2016-12-21 LAB — COMPREHENSIVE METABOLIC PANEL
ALT: 67 U/L — AB (ref 14–54)
AST: 49 U/L — AB (ref 15–41)
Albumin: 3.8 g/dL (ref 3.5–5.0)
Alkaline Phosphatase: 66 U/L (ref 38–126)
Anion gap: 11 (ref 5–15)
BILIRUBIN TOTAL: 0.9 mg/dL (ref 0.3–1.2)
BUN: 102 mg/dL — AB (ref 6–20)
CHLORIDE: 106 mmol/L (ref 101–111)
CO2: 19 mmol/L — ABNORMAL LOW (ref 22–32)
CREATININE: 7.81 mg/dL — AB (ref 0.44–1.00)
Calcium: 8.1 mg/dL — ABNORMAL LOW (ref 8.9–10.3)
GFR calc Af Amer: 6 mL/min — ABNORMAL LOW (ref >60)
GFR calc non Af Amer: 5 mL/min — ABNORMAL LOW (ref >60)
GLUCOSE: 130 mg/dL — AB (ref 65–99)
Potassium: 6 mmol/L — ABNORMAL HIGH (ref 3.5–5.1)
Sodium: 136 mmol/L (ref 135–145)
TOTAL PROTEIN: 8.2 g/dL — AB (ref 6.5–8.1)

## 2016-12-21 LAB — LACTIC ACID, PLASMA: LACTIC ACID, VENOUS: 0.5 mmol/L (ref 0.5–1.9)

## 2016-12-21 LAB — HEPATITIS B E ANTIGEN: Hep B E Ag: NEGATIVE

## 2016-12-21 LAB — LIPASE, BLOOD: Lipase: 29 U/L (ref 11–51)

## 2016-12-21 MED ORDER — PREDNISONE 20 MG PO TABS: 60.0000 mg | ORAL_TABLET | Freq: Every day | ORAL | 0 refills | Status: DC

## 2016-12-21 MED ORDER — ALBUTEROL SULFATE (2.5 MG/3ML) 0.083% IN NEBU
2.5000 mg | INHALATION_SOLUTION | Freq: Once | RESPIRATORY_TRACT | Status: AC
  Administered 2016-12-21: 2.5 mg via RESPIRATORY_TRACT
  Filled 2016-12-21: qty 3

## 2016-12-21 MED ORDER — ONDANSETRON HCL 4 MG/2ML IJ SOLN
4.0000 mg | Freq: Once | INTRAMUSCULAR | Status: AC
  Administered 2016-12-21: 4 mg via INTRAVENOUS
  Filled 2016-12-21: qty 2

## 2016-12-21 MED ORDER — OXYCODONE HCL 5 MG PO TABS
5.0000 mg | ORAL_TABLET | Freq: Once | ORAL | Status: AC
  Administered 2016-12-21: 5 mg via ORAL
  Filled 2016-12-21: qty 1

## 2016-12-21 MED ORDER — PREDNISONE 20 MG PO TABS
60.0000 mg | ORAL_TABLET | Freq: Once | ORAL | Status: AC
Start: 2016-12-21 — End: 2016-12-21
  Administered 2016-12-21: 60 mg via ORAL
  Filled 2016-12-21: qty 3

## 2016-12-21 MED ORDER — ONDANSETRON 4 MG PO TBDP: 4.0000 mg | ORAL_TABLET | Freq: Three times a day (TID) | ORAL | 0 refills | Status: DC | PRN

## 2016-12-21 MED ORDER — SODIUM BICARBONATE 8.4 % IV SOLN
50.0000 meq | Freq: Once | INTRAVENOUS | Status: AC
Start: 2016-12-21 — End: 2016-12-21
  Administered 2016-12-21: 50 meq via INTRAVENOUS
  Filled 2016-12-21: qty 50

## 2016-12-21 NOTE — ED Notes (Signed)
Pt arrives from her son's home with the complaint of feeling like she is distended, holding extra fluid and having a hard time breathing, pt is speaking without difficulty, pt states that she has not been able to complete her last round of dialysis due to social issues with her living conditions, pt states that she is scheduled this am at 1100 to have dialysis, but she will not be able to get there, pt states that she has difficulty walking and her son is unable to help her tomorrow get to her appt. Pt states that her abd feels full of pressure and states that it is increasing her difficulty with breathing, pt is tearful due to her social circumstances but able to speak in complete sentences without difficulty

## 2016-12-21 NOTE — ED Triage Notes (Signed)
Reports feeling bloated and short of breath.  Reports last full dialysis treatment was on 12/24.  States did a 2 hour treatment this past Friday, but had other appointment she needed to go to.

## 2016-12-21 NOTE — ED Notes (Signed)
Pt states she has a chair time for dialysis today at 1100. Pt's child shakes head yes when asked if they can take her to dialysis today. Pt states "but then I can't get home, I guess there's no point in coming to the emergency department anymore if you guys can't fix my problem."

## 2016-12-21 NOTE — ED Notes (Signed)
Dr. Dahlia Client back in to assess.

## 2016-12-21 NOTE — ED Notes (Signed)
Pt states has been told she would be discharged since [place] am. Dahlia Client MD notified. BMP drawn per MD order. Pt states has medical transportation to dialysis at 1000 am. Pt has scheduled dialysis at 1100 am. Pending BMP results. Plan to d/c home to have scheduled dialysis.

## 2016-12-21 NOTE — ED Notes (Signed)
Pt assisted to wheelchair upon arrival; c/o shortness of breath; says she's only had "2 hours of dialysis" this week because she's had "other doctors appointments and other things going on at home"; pt says she feels "all swollen up"; talking in complete coherent sentences

## 2016-12-21 NOTE — ED Notes (Signed)
Report to hunter, rn.  

## 2016-12-21 NOTE — Discharge Instructions (Signed)
Please go to your dialysis appointment at 11 AM. While you do have some mild pulmonary edema which is fluid in her lungs if you do not receive dialysis it could worsen. Your potassium is also mildly elevated and if he did not receive dialysis it could worsen. Also please follow up at the [facility] for your eye pain as you may need further intervention from your eye doctor. Please return with any other concerns that he may have or any worsening symptoms.

## 2016-12-21 NOTE — ED Notes (Signed)
Pt requesting ice chips. Pt states "i've been in this room for like four hours and the doctor said she was running some tests. I can't stand this waiting." explanation of delay provided to pt who verbalizes understanding, ice chips provided. Bed adjusted for comfort. Family with pt. Call bell at right side. Blood pressure obtained and pt encouraged to leave monitoring equipment in place.

## 2016-12-21 NOTE — ED Notes (Signed)
Pt states she is not going to take kayexelate because she does not "want to poop myself in dialysis today." pt states "that medicine better not have any tylenol in it." pt assured oxycodone does not have tylenol in it. Pt states "i'm going to the pain management doctor on Tuesday so there better be a paper record of that oxycodone for them." lights dimmed for comfort. Family remains at bedside. Pt states "this is ridiculous I don't understand why my nephrologist told me to come to the emergency department for dialysis if you're not going to do it now."

## 2016-12-21 NOTE — ED Notes (Signed)
Pt updated on delay. Pt verbalizes understanding.  

## 2016-12-21 NOTE — ED Provider Notes (Signed)
[facility] Emergency Department Provider Note   ____________________________________________   First MD Initiated Contact with Patient 12/21/16 [phone]     (approximate)  I have reviewed the triage vital signs and the nursing notes.   HISTORY  Chief Complaint Shortness of Breath and Abdominal Pain    HPI [name] is a 51 y.o. female who comes into the hospital today with difficulty breathing. The patient also had some some stomach swelling, abdominal pain, fever and vomiting. The patient is a dialysis patient and receives dialysis typically Monday Wednesdays and Fridays. The patient reports though that this week she hasn't been able to go as often as she needed to. She reports that she was feeling weak and couldn't get up by herself. She also reports that she had other things to do and other appointments to make. The patient reports that she missed dialysis on Wednesday and only could get it for 2 hours on Friday because she had a doctor's appointment. The patient reports that she is trying to get on the transplant list for a kidney but she's trying to get things taken care of. The patient reports that she's had some abdominal pain and it was found that she either has a blood clot or plaque in one of the vessels in her stomach. She reports that Dr. due evaluated her on Friday and wants to ultrasound to check this area. She reports that she had some vomiting starting about 5:30 AM with some dry heaves and she just felt weak. The patient reports that she feels bloated and she felt short of breath. She reports that she needs dialysis and she decided to come in here to the emergency department for evaluation. She reports that her kidney doctors tell her that whenever she feels back to come into the ER to get checked out. The patient has no chest pain, no dizziness or lightheadedness. She does have some right eye pain which she reports drains occasionally. She had surgery  on her eye about a month ago but reports that she hasn't followed up in some time.   Past Medical History:  Diagnosis Date  . Anemia   . Anxiety   . Arthritis   . CHF (congestive heart failure) ([place])   . Chronic kidney disease   . COPD (chronic obstructive pulmonary disease) ([place])   . Depression   . Diabetes mellitus without complication ([place])   . Emphysema of lung ([place])   . End stage renal disease ([place])   . Hepatitis   . Hypercholesterolemia   . Hypertension   . Tobacco dependence     Patient Active Problem List   Diagnosis Date Noted  . Diabetes ([place]) 12/18/2016  . SMA stenosis ([place]) 12/18/2016  . Atypical chest pain 10/20/2016  . Kidney transplant candidate 08/11/2016  . MRSA (methicillin resistant Staphylococcus aureus) infection 07/18/2016  . Status post incision and drainage 07/18/2016  . Chronic ulcer of left foot ([place]) 07/18/2016  . Acquired palmar and plantar hyperkeratosis 07/18/2016  . Elevated troponin 07/18/2016  . Essential hypertension 07/18/2016  . Epistaxis 07/18/2016  . Tobacco abuse 07/18/2016  . ESRD on dialysis ([place]) 07/18/2016  . Orbital cellulitis on right 07/13/2016  . Cellulitis 06/06/2016  . Adjustment disorder with mixed disturbance of emotions and conduct 05/11/2016  . Dysthymia 05/11/2016  . Diabetic macular edema ([place]) 08/24/2013  . Hypertensive retinopathy of both eyes 08/24/2013  . Non-proliferative diabetic retinopathy, severe, both eyes ([place]) 08/24/2013  . Psoriatic arthropathy ([place]) 11/14/2012  Past Surgical History:  Procedure Laterality Date  . CHOLECYSTECTOMY    . cyst removed  from rt hand    . INCISION AND DRAINAGE ABSCESS N/A 07/16/2016   Procedure: INCISION AND DRAINAGE ABSCESS;  Surgeon: Carloyn Manner, MD;  Location: [facility] ORS;  Service: ENT;  Laterality: N/A;  . PERIPHERAL VASCULAR CATHETERIZATION N/A 07/25/2015   Procedure: A/V Shuntogram/Fistulagram;  Surgeon: Algernon Huxley, MD;  Location: [place] [facility];   Service: Cardiovascular;  Laterality: N/A;  . PERIPHERAL VASCULAR CATHETERIZATION Left 07/25/2015   Procedure: A/V Shunt Intervention;  Surgeon: Algernon Huxley, MD;  Location: [place] [facility];  Service: Cardiovascular;  Laterality: Left;  . PERIPHERAL VASCULAR CATHETERIZATION Left 10/07/2015   Procedure: A/V Shuntogram/Fistulagram;  Surgeon: Algernon Huxley, MD;  Location: [place] [facility];  Service: Cardiovascular;  Laterality: Left;  . PERIPHERAL VASCULAR CATHETERIZATION N/A 10/07/2015   Procedure: A/V Shunt Intervention;  Surgeon: Algernon Huxley, MD;  Location: [place] [facility];  Service: Cardiovascular;  Laterality: N/A;  . PERIPHERAL VASCULAR CATHETERIZATION  10/07/2015   Procedure: Dialysis/Perma Catheter Insertion;  Surgeon: Algernon Huxley, MD;  Location: [place] [facility];  Service: Cardiovascular;;  . PERIPHERAL VASCULAR CATHETERIZATION N/A 12/17/2015   Procedure: Dialysis/Perma Catheter Removal;  Surgeon: Katha Cabal, MD;  Location: [place] [facility];  Service: Cardiovascular;  Laterality: N/A;  . rt. tubal and ovary removed    . TONSILLECTOMY      Prior to Admission medications   Medication Sig Start Date End Date Taking? Authorizing Provider  b complex-vitamin c-folic acid (NEPHRO-VITE) 0.8 MG TABS tablet Take 1 tablet by mouth daily.   Yes Historical Provider, MD  BD SAFETYGLIDE INSULIN SYRINGE 29G X 1/2" 0.5 ML MISC  11/26/16  Yes Historical Provider, MD  calcium acetate (PHOSLO) 667 MG capsule Take 2,001 mg by mouth 3 (three) times daily with meals.  11/26/16  Yes Historical Provider, MD  clobetasol ointment (TEMOVATE) 8.33 % Apply 1 application topically daily.   Yes Historical Provider, MD  diltiazem (CARDIZEM SR) 90 MG 12 hr capsule Take 2 capsules (180 mg total) by mouth 2 (two) times daily. 07/18/16  Yes [name], MD  hydrALAZINE (APRESOLINE) 100 MG tablet Take 100 mg by mouth 2 (two) times daily.   Yes Historical Provider, MD  insulin glargine (LANTUS) 100  UNIT/ML injection Inject 15 Units into the skin at bedtime.    Yes Historical Provider, MD  insulin regular (NOVOLIN R,HUMULIN R) 100 units/mL injection Inject 5-10 Units into the skin 3 (three) times daily before meals. Pt uses per sliding scale.   Yes Historical Provider, MD  loperamide (IMODIUM A-D) 2 MG tablet Take by mouth.   Yes Historical Provider, MD  omeprazole (PRILOSEC) 40 MG capsule Take 40 mg by mouth daily.   Yes Historical Provider, MD  prednisoLONE acetate (PRED FORTE) 1 % ophthalmic suspension Place 1 drop into the right eye daily.   Yes Historical Provider, MD  promethazine (PHENERGAN) 25 MG tablet Take 1 tablet (25 mg total) by mouth every 4 (four) hours as needed for nausea or vomiting. 10/19/16  Yes [name], MD  sevelamer carbonate (RENVELA) 800 MG tablet Take 1,600 mg by mouth 3 (three) times daily with meals.   Yes Historical Provider, MD  cyclopentolate (CYCLODRYL,CYCLOGYL) 1 % ophthalmic solution Administer 1 drop to the right eye Two (2) times a day.    Historical Provider, MD  docusate sodium (COLACE) 100 MG capsule Take 1 capsule (100 mg  total) by mouth 2 (two) times daily. Patient not taking: Reported on 12/21/2016 07/18/16   [name], MD  gentamicin ointment (GARAMYCIN) 0.1 % Apply topically 4 (four) times daily. Patient not taking: Reported on 12/21/2016 07/18/16   [name], MD  nicotine polacrilex (NICORETTE) 2 MG gum Take 1 each (2 mg total) by mouth as needed for smoking cessation. Patient not taking: Reported on 12/21/2016 07/18/16   [name], MD  ondansetron (ZOFRAN ODT) 4 MG disintegrating tablet Take 1 tablet (4 mg total) by mouth every 8 (eight) hours as needed for nausea or vomiting. 12/21/16   Loney Hering, MD  oxyCODONE (ROXICODONE) 5 MG immediate release tablet Take 1 tablet (5 mg total) by mouth every 8 (eight) hours as needed. Patient not taking: Reported on 12/21/2016 10/19/16 10/19/17  [name], MD  predniSONE (DELTASONE)  20 MG tablet Take 3 tablets (60 mg total) by mouth daily. 12/21/16   Loney Hering, MD  ranitidine (ZANTAC) 150 MG tablet Take 1 tablet (150 mg total) by mouth 2 (two) times daily. 12/15/16 01/15/17  Jonathon Bellows, MD  sodium chloride (OCEAN) 0.65 % SOLN nasal spray Place 1 spray into both nostrils as needed for congestion. Patient not taking: Reported on 12/21/2016 07/18/16   [name], MD  traMADol (ULTRAM) 50 MG tablet Take 1 tablet (50 mg total) by mouth every 6 (six) hours as needed for severe pain. Patient not taking: Reported on 12/21/2016 07/12/16 07/12/17  Nance Pear, MD  traMADol (ULTRAM) 50 MG tablet Take 1 tablet (50 mg total) by mouth every 6 (six) hours as needed. Patient not taking: Reported on 12/21/2016 11/20/16 11/20/17  Nance Pear, MD    Allergies Cinnamon; Cinoxate; Tylenol [acetaminophen]; Ciprofloxacin; Garlic; and Onion  Family History  Problem Relation Age of Onset  . Hypertension Mother   . Heart disease Mother   . Hypertension Father     Social History Social History  Substance Use Topics  . Smoking status: Current Every Day Smoker    Packs/day: 0.50    Years: 17.00    Types: Cigarettes  . Smokeless tobacco: Never Used  . Alcohol use No    Review of Systems Constitutional: No fever/chills Eyes: Right third vision and intermittent eye drainage ENT: No sore throat. Cardiovascular: Denies chest pain. Respiratory:  shortness of breath. Gastrointestinal:  abdominal pain, nausea, vomiting.  No diarrhea.  No constipation. Genitourinary: Negative for dysuria. Musculoskeletal: Negative for back pain. Skin: Negative for rash. Neurological: Negative for headaches, focal weakness or numbness.  10-point ROS otherwise negative.  ____________________________________________   PHYSICAL EXAM:  VITAL SIGNS: ED Triage Vitals  Enc Vitals Group     BP 12/21/16 0131 (!) 178/91     Pulse Rate 12/21/16 0131 100     Resp 12/21/16 0131 (!) 22     Temp 12/21/16  0131 97.9 F (36.6 C)     Temp Source 12/21/16 0131 Oral     SpO2 12/21/16 0131 95 %     Weight --      Height --      Head Circumference --      Peak Flow --      Pain Score 12/21/16 0129 7     Pain Loc --      Pain Edu? --      Excl. in [place]? --     Constitutional: Alert and oriented. Well appearing and in Mild distress. Eyes: Erythema to the right lateral eye in the sclera Conjunctivae are  normal. PERRL. EOMI. Head: Atraumatic. Nose: No congestion/rhinnorhea. Mouth/Throat: Mucous membranes are moist.  Oropharynx non-erythematous. Cardiovascular: Normal rate, regular rhythm. Grossly normal heart sounds.  Good peripheral circulation. Respiratory: Normal respiratory effort.  No retractions. Lungs CTAB. Gastrointestinal: Soft with diffuse tenderness to palpation. No distention. Positive bowel sounds Musculoskeletal: No lower extremity tenderness nor edema.   Neurologic:  Normal speech and language.  Skin:  Skin is warm, dry and intact.  Psychiatric: Mood and affect are normal.   ____________________________________________   LABS (all labs ordered are listed, but only abnormal results are displayed)  Labs Reviewed  COMPREHENSIVE METABOLIC PANEL - Abnormal; Notable for the following:       Result Value   Potassium 6.0 (*)    CO2 19 (*)    Glucose, Bld 130 (*)    BUN 102 (*)    Creatinine, Ser 7.81 (*)    Calcium 8.1 (*)    Total Protein 8.2 (*)    AST 49 (*)    ALT 67 (*)    GFR calc non Af Amer 5 (*)    GFR calc Af Amer 6 (*)    All other components within normal limits  CBC - Abnormal; Notable for the following:    RBC 3.72 (*)    Platelets 144 (*)    All other components within normal limits  TROPONIN I - Abnormal; Notable for the following:    Troponin I 0.04 (*)    All other components within normal limits  BASIC METABOLIC PANEL - Abnormal; Notable for the following:    Potassium 6.1 (*)    CO2 18 (*)    BUN 103 (*)    Creatinine, Ser 8.00 (*)    Calcium  8.0 (*)    GFR calc non Af Amer 5 (*)    GFR calc Af Amer 6 (*)    All other components within normal limits  LIPASE, BLOOD  LACTIC ACID, PLASMA   ____________________________________________  EKG  ED ECG REPORT I, Loney Hering, the attending physician, personally viewed and interpreted this ECG.   Date: 12/21/2016  EKG Time: 141  Rate: 100  Rhythm: normal sinus rhythm  Axis: normal  Intervals:none  ST&T Change: none  ____________________________________________  RADIOLOGY  CXR ____________________________________________   PROCEDURES  Procedure(s) performed: None  Procedures  Critical Care performed: No  ____________________________________________   INITIAL IMPRESSION / ASSESSMENT AND PLAN / ED COURSE  Pertinent labs & imaging results that were available during my care of the patient were reviewed by me and considered in my medical decision making (see chart for details).  This is a 51 year old female with a history of end-stage renal disease on dialysis who missed some dialysis this week. She is complaining of some shortness of breath. I did perform a chest x-ray which showed some interstitial prominence which is more concerning for pulmonary edema than pneumonia. I also checked the patient's blood work and she does have an elevated potassium. Her potassium is 6. She is not hypoxic and not in any respiratory distress. I did give the patient a dose of sodium bicarbonate as well as an albuterol treatment. I offered to give the patient some Kayexalate but the patient refused that she reports she doesn't want to be in the bathroom all day. The patient is due to have dialysis today at 11 AM. After looking at the patient's blood work I discussed with her that I feel she can go to her dialysis appointment as she does not  meet criteria currently for admission. The patient became upset and she reports that whenever she comes no one does anything for her. After looking  at the patient's eye though I was concerned that she does have some scleritis. I did give the patient a dose of prednisone and I told her that she absolutely needs to follow-up with her eye doctor in the next day or 2. The patient's son is with her and he reports that he can take her to dialysis but the patient reports that her Lucianne Lei will, at 10 AM. I did repeat the patient's BMP and after multiple hours her potassium was 6.1. I continued to encourage the patient to follow-up to have her dialysis That will help her shortness of breath. The patient reports that she will go to dialysis today. The patient did have a lactic acid to evaluate her abdominal pain and was normal. The patient does have some insufficiency but she is currently being followed by Dr. due and does need to follow back up. She reports that her abdominal pain is the same as it has always been. The patient otherwise has no other complaints. She'll be discharged home to follow-up and receive dialysis.  Clinical Course as of Dec 22 839  Mon Dec 21, 2016  0354 Mild interstitial prominence can be seen with atypical pneumonia, pulmonary edema. Scattered atelectasis.   DG Chest 2 View [AW]    Clinical Course User Index [AW] Loney Hering, MD     ____________________________________________   FINAL CLINICAL IMPRESSION(S) / ED DIAGNOSES  Final diagnoses:  Shortness of breath  Chronic pulmonary edema  Scleritis of right eye  Chronic abdominal pain  Hyperkalemia      NEW MEDICATIONS STARTED DURING THIS VISIT:  New Prescriptions   ONDANSETRON (ZOFRAN ODT) 4 MG DISINTEGRATING TABLET    Take 1 tablet (4 mg total) by mouth every 8 (eight) hours as needed for nausea or vomiting.   PREDNISONE (DELTASONE) 20 MG TABLET    Take 3 tablets (60 mg total) by mouth daily.     Note:  This document was prepared using Dragon voice recognition software and may include unintentional dictation errors.    Loney Hering, MD 12/21/16  [phone]

## 2016-12-21 NOTE — ED Notes (Signed)
Pt nauseated per ed tech. Will notify md at next chance.

## 2016-12-21 NOTE — ED Notes (Signed)
Pt continually removing blood pressure cuff. Pt again reminded to please keep monitoring equipment in place.

## 2016-12-21 NOTE — ED Notes (Signed)
Critical troponin of 0.04 called from lab. Dr. Dahlia Client notified. Pt provided with ice pack for forehead per request. Pt states she thinks her right eye is infected, md notified regarding this. Pt states nausea is improved.

## 2016-12-21 NOTE — ED Notes (Signed)
Pt requesting po fluids. md consulted and states pt may have po fluids. Diet sprite provided. Temperature adjusted in room for pt comfort.

## 2016-12-21 NOTE — ED Notes (Signed)
Report from brandy, rn. 

## 2016-12-23 DIAGNOSIS — D631 Anemia in chronic kidney disease: Secondary | ICD-10-CM | POA: Diagnosis not present

## 2016-12-23 DIAGNOSIS — D509 Iron deficiency anemia, unspecified: Secondary | ICD-10-CM | POA: Diagnosis not present

## 2016-12-23 DIAGNOSIS — N186 End stage renal disease: Secondary | ICD-10-CM | POA: Diagnosis not present

## 2016-12-23 DIAGNOSIS — Z992 Dependence on renal dialysis: Secondary | ICD-10-CM | POA: Diagnosis not present

## 2016-12-25 DIAGNOSIS — D631 Anemia in chronic kidney disease: Secondary | ICD-10-CM | POA: Diagnosis not present

## 2016-12-25 DIAGNOSIS — D509 Iron deficiency anemia, unspecified: Secondary | ICD-10-CM | POA: Diagnosis not present

## 2016-12-25 DIAGNOSIS — N186 End stage renal disease: Secondary | ICD-10-CM | POA: Diagnosis not present

## 2016-12-25 DIAGNOSIS — Z992 Dependence on renal dialysis: Secondary | ICD-10-CM | POA: Diagnosis not present

## 2016-12-28 DIAGNOSIS — D631 Anemia in chronic kidney disease: Secondary | ICD-10-CM | POA: Diagnosis not present

## 2016-12-28 DIAGNOSIS — Z992 Dependence on renal dialysis: Secondary | ICD-10-CM | POA: Diagnosis not present

## 2016-12-28 DIAGNOSIS — D509 Iron deficiency anemia, unspecified: Secondary | ICD-10-CM | POA: Diagnosis not present

## 2016-12-28 DIAGNOSIS — N186 End stage renal disease: Secondary | ICD-10-CM | POA: Diagnosis not present

## 2016-12-28 LAB — HCV RT-PCR, QUANT (NON-GRAPH)
HCV LOG10: UNDETERMINED {Log_copies}/mL
HCV QUANTITATIVE BASELINE: UNDETERMINED IU/mL
HEPATITIS C QUANTITATION: UNDETERMINED {copies}/mL
IU log10: UNDETERMINED Log10 IU/mL

## 2016-12-28 LAB — HEPATITIS C GENOTYPE

## 2016-12-30 DIAGNOSIS — N186 End stage renal disease: Secondary | ICD-10-CM | POA: Diagnosis not present

## 2016-12-30 DIAGNOSIS — Z992 Dependence on renal dialysis: Secondary | ICD-10-CM | POA: Diagnosis not present

## 2016-12-30 DIAGNOSIS — D509 Iron deficiency anemia, unspecified: Secondary | ICD-10-CM | POA: Diagnosis not present

## 2016-12-30 DIAGNOSIS — D631 Anemia in chronic kidney disease: Secondary | ICD-10-CM | POA: Diagnosis not present

## 2016-12-31 ENCOUNTER — Ambulatory Visit (INDEPENDENT_AMBULATORY_CARE_PROVIDER_SITE_OTHER): Payer: Medicare Other

## 2016-12-31 ENCOUNTER — Encounter (INDEPENDENT_AMBULATORY_CARE_PROVIDER_SITE_OTHER): Payer: Self-pay

## 2016-12-31 ENCOUNTER — Ambulatory Visit (INDEPENDENT_AMBULATORY_CARE_PROVIDER_SITE_OTHER): Payer: Medicare Other | Admitting: Vascular Surgery

## 2016-12-31 ENCOUNTER — Encounter (INDEPENDENT_AMBULATORY_CARE_PROVIDER_SITE_OTHER): Payer: Self-pay | Admitting: Vascular Surgery

## 2016-12-31 VITALS — BP 142/78 | HR 79 | Resp 16 | Ht 66.0 in | Wt 204.0 lb

## 2016-12-31 DIAGNOSIS — Z992 Dependence on renal dialysis: Secondary | ICD-10-CM | POA: Diagnosis not present

## 2016-12-31 DIAGNOSIS — K551 Chronic vascular disorders of intestine: Secondary | ICD-10-CM

## 2016-12-31 DIAGNOSIS — N186 End stage renal disease: Secondary | ICD-10-CM | POA: Diagnosis not present

## 2016-12-31 DIAGNOSIS — Z720 Tobacco use: Secondary | ICD-10-CM

## 2016-12-31 DIAGNOSIS — I771 Stricture of artery: Secondary | ICD-10-CM

## 2016-12-31 DIAGNOSIS — R1084 Generalized abdominal pain: Secondary | ICD-10-CM

## 2016-12-31 NOTE — Progress Notes (Signed)
Subjective:    Patient ID: [name], female    DOB: 07-17-1966, 51 y.o.   MRN: [id number] Chief Complaint  Patient presents with  . Re-evaluation    Ultrasound follow up   Patient last seen on 12/18/16 at the request of her gastroenterologist. She needs an endoscopy as part of her transplant workup, and her gastroenterologist stated she could not have one until her vascular system was further interrogated. She continues to experience abdominal pain, food fear, and some diarrhea for many months. This is intermittent, but severe. Nothing in particular seemed to bring it on and nothing has really improved this. She reports no inciting event or causative factor. She denies fever or chills. She had a CT scan performed which was independently reviewed by Dr. Lucky Cowboy. Although no clear cause of her abdominal symptoms was seen, there was abnormality within the superior mesenteric artery. Although this was somewhat difficult to characterize as this was not a CT angiogram, some atherosclerotic plaque and potentially some mural thrombus is possibly present. The determination of flow limitation was difficult to discern from this study. Today, the patient underwent a mesenteric duplex which was limited due to bowel gas however was notable for no mesenteric artery stenosis. Denies fever.    Review of Systems Constitutional: [] Weight loss  [] Fever  [] Chills Cardiac: [] Chest pain   [] Chest pressure   [x] Palpitations   [] Shortness of breath when laying flat   [] Shortness of breath at rest   [] Shortness of breath with exertion. Vascular:  [] Pain in legs with walking   [] Pain in legs at rest   [] Pain in legs when laying flat   [] Claudication   [] Pain in feet when walking  [] Pain in feet at rest  [] Pain in feet when laying flat   [] History of DVT   [] Phlebitis   [x] Swelling in legs   [] Varicose veins   [] Non-healing ulcers Pulmonary:   [] Uses home oxygen   [] Productive cough   [] Hemoptysis   [] Wheeze  [] COPD    [] Asthma Neurologic:  [] Dizziness  [] Blackouts   [] Seizures   [] History of stroke   [] History of TIA  [] Aphasia   [] Temporary blindness   [] Dysphagia   [] Weakness or numbness in arms   [] Weakness or numbness in legs Musculoskeletal:  [] Arthritis   [] Joint swelling   [] Joint pain   [] Low back pain Hematologic:  [] Easy bruising  [] Easy bleeding   [] Hypercoagulable state   [] Anemic   Gastrointestinal:  [] Blood in stool   [] Vomiting blood  [] Gastroesophageal reflux/heartburn   [x] Abdominal pain Genitourinary:  [x] Chronic kidney disease   [] Difficult urination  [] Frequent urination  [] Burning with urination   [] Hematuria Skin:  [] Rashes   [] Ulcers   [] Wounds Psychological:  [] History of anxiety   []  History of major depression    Objective:   Physical Exam Gen:  WD/WN, NAD Head: [place]/AT, No temporalis wasting. Ear/Nose/Throat: Hearing grossly intact, nares w/o erythema or drainage, trachea midline Eyes: Conjunctiva clear. Sclera non-icteric Neck: Supple.  No JVD.  Pulmonary:  Good air movement, no use of accessory muscles.  Cardiac: RRR, normal S1, S2 Vascular: Thrill and bruit are present in left arm AV fistula Vessel Right Left  Radial Palpable Palpable                                   Gastrointestinal: soft, non-tender/non-distended. No guarding/reflex.  Musculoskeletal: M/S 5/5 throughout.  No deformity or atrophy. Mild lower  extremity edema Neurologic: Sensation grossly intact in extremities.  Symmetrical.  Speech is fluent.  Psychiatric: Judgment intact, Mood & affect appropriate for pt's clinical situation. Dermatologic: No rashes or ulcers noted.  No cellulitis or open wounds. Lymph : No Cervical, Axillary, or Inguinal lymphadenopathy.  BP (!) 142/78 (BP Location: Right Arm)   Pulse 79   Resp 16   Ht 5\' 6"  (1.676 m)   Wt 204 lb (92.5 kg)   BMI 32.93 kg/m   Past Medical History:  Diagnosis Date  . Anemia   . Anxiety   . Arthritis   . CHF  (congestive heart failure) ([place])   . Chronic kidney disease   . COPD (chronic obstructive pulmonary disease) ([place])   . Depression   . Diabetes mellitus without complication ([place])   . Emphysema of lung ([place])   . End stage renal disease ([place])   . Hepatitis   . Hypercholesterolemia   . Hypertension   . Tobacco dependence     Social History   Social History  . Marital status: Legally Separated    Spouse name: N/A  . Number of children: N/A  . Years of education: N/A   Occupational History  . disabled    Social History Main Topics  . Smoking status: Current Every Day Smoker    Packs/day: 0.50    Years: 17.00    Types: Cigarettes  . Smokeless tobacco: Never Used  . Alcohol use No  . Drug use: No  . Sexual activity: Not on file   Other Topics Concern  . Not on file   Social History Narrative  . No narrative on file    Past Surgical History:  Procedure Laterality Date  . CHOLECYSTECTOMY    . cyst removed  from rt hand    . INCISION AND DRAINAGE ABSCESS N/A 07/16/2016   Procedure: INCISION AND DRAINAGE ABSCESS;  Surgeon: Carloyn Manner, MD;  Location: [facility]S;  Service: ENT;  Laterality: N/A;  . PERIPHERAL VASCULAR CATHETERIZATION N/A 07/25/2015   Procedure: A/V Shuntogram/Fistulagram;  Surgeon: Algernon Huxley, MD;  Location: [place] [facility];  Service: Cardiovascular;  Laterality: N/A;  . PERIPHERAL VASCULAR CATHETERIZATION Left 07/25/2015   Procedure: A/V Shunt Intervention;  Surgeon: Algernon Huxley, MD;  Location: [place] [facility];  Service: Cardiovascular;  Laterality: Left;  . PERIPHERAL VASCULAR CATHETERIZATION Left 10/07/2015   Procedure: A/V Shuntogram/Fistulagram;  Surgeon: Algernon Huxley, MD;  Location: [place] [facility];  Service: Cardiovascular;  Laterality: Left;  . PERIPHERAL VASCULAR CATHETERIZATION N/A 10/07/2015   Procedure: A/V Shunt Intervention;  Surgeon: Algernon Huxley, MD;  Location: [place] [facility];  Service: Cardiovascular;  Laterality: N/A;  .  PERIPHERAL VASCULAR CATHETERIZATION  10/07/2015   Procedure: Dialysis/Perma Catheter Insertion;  Surgeon: Algernon Huxley, MD;  Location: [place] [facility];  Service: Cardiovascular;;  . PERIPHERAL VASCULAR CATHETERIZATION N/A 12/17/2015   Procedure: Dialysis/Perma Catheter Removal;  Surgeon: Katha Cabal, MD;  Location: [place] [facility];  Service: Cardiovascular;  Laterality: N/A;  . rt. tubal and ovary removed    . TONSILLECTOMY      Family History  Problem Relation Age of Onset  . Hypertension Mother   . Heart disease Mother   . Hypertension Father     Allergies  Allergen Reactions  . Cinnamon Anaphylaxis  . Cinoxate Anaphylaxis  . Tylenol [Acetaminophen] Anaphylaxis  . Ciprofloxacin Diarrhea  . Garlic Hives  . Onion Hives and Swelling  Assessment & Plan:  Patient last seen on 12/18/16 at the request of her gastroenterologist. She needs an endoscopy as part of her transplant workup, and her gastroenterologist stated she could not have one until her vascular system was further interrogated. She continues to experience abdominal pain, food fear, and some diarrhea for many months. This is intermittent, but severe. Nothing in particular seemed to bring it on and nothing has really improved this. She reports no inciting event or causative factor. She denies fever or chills. She had a CT scan performed which was independently reviewed by Dr. Lucky Cowboy. Although no clear cause of her abdominal symptoms was seen, there was abnormality within the superior mesenteric artery. Although this was somewhat difficult to characterize as this was not a CT angiogram, some atherosclerotic plaque and potentially some mural thrombus is possibly present. The determination of flow limitation was difficult to discern from this study. Today, the patient underwent a mesenteric duplex which was limited due to bowel gas however was notable for no mesenteric artery stenosis. Denies fever.   1. Tobacco abuse  - Stable I have discussed (approximately 5 minutes) with the patient the role of tobacco in the pathogenesis of atherosclerosis and its effect on the progression of the disease, impact on the durability of interventions and its limitations on the formation of collateral pathways. I have recommended absolute tobacco cessation. I have discussed various options available for assistance with tobacco cessation including over the counter methods (Nicotine gum, patch and lozenges). We also discussed prescription options (Chantix, Nicotine Inhaler / Nasal Spray). The patient is not interested in pursuing any prescription tobacco cessation options at this time. The patient voices their understanding.   2. Generalized abdominal pain - Stable Patient still symptomatic with conflicted imaging reports.  Recommend mesenteric angiogram to clarify it patient has mesenteric stenosis and if so can be treated at that time.  Procedure, risks and benefits explained to patient.  All questions answered.  Patient agrees to proceed.  3. ESRD on dialysis ([place]) - Stable Working without issues.   Current Outpatient Prescriptions on File Prior to Visit  Medication Sig Dispense Refill  . b complex-vitamin c-folic acid (NEPHRO-VITE) 0.8 MG TABS tablet Take 1 tablet by mouth daily.    . BD SAFETYGLIDE INSULIN SYRINGE 29G X 1/2" 0.5 ML MISC     . calcium acetate (PHOSLO) 667 MG capsule Take 2,001 mg by mouth 3 (three) times daily with meals.     . clobetasol ointment (TEMOVATE) 2.77 % Apply 1 application topically daily.    . cyclopentolate (CYCLODRYL,CYCLOGYL) 1 % ophthalmic solution Administer 1 drop to the right eye Two (2) times a day.    . diltiazem (CARDIZEM SR) 90 MG 12 hr capsule Take 2 capsules (180 mg total) by mouth 2 (two) times daily. 60 capsule 6  . docusate sodium (COLACE) 100 MG capsule Take 1 capsule (100 mg total) by mouth 2 (two) times daily. 10 capsule 0  . gentamicin ointment (GARAMYCIN) 0.1 % Apply  topically 4 (four) times daily. 15 g 0  . hydrALAZINE (APRESOLINE) 100 MG tablet Take 100 mg by mouth 2 (two) times daily.    . insulin glargine (LANTUS) 100 UNIT/ML injection Inject 15 Units into the skin at bedtime.     . insulin regular (NOVOLIN R,HUMULIN R) 100 units/mL injection Inject 5-10 Units into the skin 3 (three) times daily before meals. Pt uses per sliding scale.    . loperamide (IMODIUM A-D) 2 MG tablet Take by mouth.    Marland Kitchen  nicotine polacrilex (NICORETTE) 2 MG gum Take 1 each (2 mg total) by mouth as needed for smoking cessation. 100 tablet 0  . omeprazole (PRILOSEC) 40 MG capsule Take 40 mg by mouth daily.    . ondansetron (ZOFRAN ODT) 4 MG disintegrating tablet Take 1 tablet (4 mg total) by mouth every 8 (eight) hours as needed for nausea or vomiting. 20 tablet 0  . prednisoLONE acetate (PRED FORTE) 1 % ophthalmic suspension Place 1 drop into the right eye daily.    . predniSONE (DELTASONE) 20 MG tablet Take 3 tablets (60 mg total) by mouth daily. 12 tablet 0  . promethazine (PHENERGAN) 25 MG tablet Take 1 tablet (25 mg total) by mouth every 4 (four) hours as needed for nausea or vomiting. 30 tablet 1  . ranitidine (ZANTAC) 150 MG tablet Take 1 tablet (150 mg total) by mouth 2 (two) times daily. 60 tablet 1  . sevelamer carbonate (RENVELA) 800 MG tablet Take 1,600 mg by mouth 3 (three) times daily with meals.    . sodium chloride (OCEAN) 0.65 % SOLN nasal spray Place 1 spray into both nostrils as needed for congestion. 1 Bottle 5  . traMADol (ULTRAM) 50 MG tablet Take 1 tablet (50 mg total) by mouth every 6 (six) hours as needed. 15 tablet 0  . oxyCODONE (ROXICODONE) 5 MG immediate release tablet Take 1 tablet (5 mg total) by mouth every 8 (eight) hours as needed. (Patient not taking: Reported on 12/31/2016) 20 tablet 0  . traMADol (ULTRAM) 50 MG tablet Take 1 tablet (50 mg total) by mouth every 6 (six) hours as needed for severe pain. (Patient not taking: Reported on 12/31/2016) 20  tablet 0   No current facility-administered medications on file prior to visit.     There are no Patient Instructions on file for this visit. No Follow-up on file.   Donette Mainwaring A Nhyira Leano, PA-C

## 2017-01-01 DIAGNOSIS — Z992 Dependence on renal dialysis: Secondary | ICD-10-CM | POA: Diagnosis not present

## 2017-01-01 DIAGNOSIS — D509 Iron deficiency anemia, unspecified: Secondary | ICD-10-CM | POA: Diagnosis not present

## 2017-01-01 DIAGNOSIS — D631 Anemia in chronic kidney disease: Secondary | ICD-10-CM | POA: Diagnosis not present

## 2017-01-01 DIAGNOSIS — N186 End stage renal disease: Secondary | ICD-10-CM | POA: Diagnosis not present

## 2017-01-04 ENCOUNTER — Telehealth: Payer: Self-pay

## 2017-01-04 ENCOUNTER — Other Ambulatory Visit: Payer: Self-pay

## 2017-01-04 DIAGNOSIS — N186 End stage renal disease: Secondary | ICD-10-CM | POA: Diagnosis not present

## 2017-01-04 DIAGNOSIS — R748 Abnormal levels of other serum enzymes: Secondary | ICD-10-CM

## 2017-01-04 DIAGNOSIS — D509 Iron deficiency anemia, unspecified: Secondary | ICD-10-CM | POA: Diagnosis not present

## 2017-01-04 DIAGNOSIS — Z992 Dependence on renal dialysis: Secondary | ICD-10-CM | POA: Diagnosis not present

## 2017-01-04 DIAGNOSIS — D631 Anemia in chronic kidney disease: Secondary | ICD-10-CM | POA: Diagnosis not present

## 2017-01-04 NOTE — Telephone Encounter (Signed)
-----   Message from Jonathon Bellows, MD sent at 12/29/2016  8:21 AM EST ----- Blood qty was insufficient for viral load - needs repeat

## 2017-01-04 NOTE — Telephone Encounter (Signed)
Left vm letting pt know she needs to have lab repeat as the blood qty was insufficient. Advised to contact me with any questions or head to [facility] outpatient lab.

## 2017-01-05 ENCOUNTER — Other Ambulatory Visit (INDEPENDENT_AMBULATORY_CARE_PROVIDER_SITE_OTHER): Payer: Self-pay | Admitting: Vascular Surgery

## 2017-01-07 ENCOUNTER — Encounter
Admission: RE | Admit: 2017-01-07 | Discharge: 2017-01-07 | Disposition: A | Discharge status: Home / Self Care | Payer: Medicare Other | Source: Ambulatory Visit | Attending: Vascular Surgery | Admitting: Vascular Surgery

## 2017-01-07 HISTORY — DX: Peripheral vascular disease, unspecified: I73.9

## 2017-01-07 HISTORY — DX: Pneumonia, unspecified organism: J18.9

## 2017-01-07 NOTE — Pre-Procedure Instructions (Addendum)
Pt called to say her ride is unable to bring her to the hospital for her PAT visit and can we do her interview on the phone.  Pt instructed to come in early the day of procedure, check in at admission desk then report to [place].  Let the staff in [place] know that pt was unable to come to PAT visit today and that her lab work needs to be drawn.

## 2017-01-08 ENCOUNTER — Inpatient Hospital Stay: Admission: RE | Admit: 2017-01-08 | Payer: Medicare Other | Source: Ambulatory Visit

## 2017-01-08 ENCOUNTER — Emergency Department: Payer: Medicare Other

## 2017-01-08 ENCOUNTER — Encounter: Payer: Self-pay | Admitting: Emergency Medicine

## 2017-01-08 ENCOUNTER — Inpatient Hospital Stay
Admission: EM | Admit: 2017-01-08 | Discharge: 2017-01-12 | DRG: 640 | Disposition: A | Discharge status: Home / Self Care | Payer: Medicare Other | Attending: Internal Medicine | Admitting: Internal Medicine

## 2017-01-08 DIAGNOSIS — Z79891 Long term (current) use of opiate analgesic: Secondary | ICD-10-CM | POA: Diagnosis not present

## 2017-01-08 DIAGNOSIS — Z992 Dependence on renal dialysis: Secondary | ICD-10-CM

## 2017-01-08 DIAGNOSIS — R1084 Generalized abdominal pain: Secondary | ICD-10-CM | POA: Diagnosis not present

## 2017-01-08 DIAGNOSIS — N186 End stage renal disease: Secondary | ICD-10-CM | POA: Diagnosis not present

## 2017-01-08 DIAGNOSIS — B182 Chronic viral hepatitis C: Secondary | ICD-10-CM | POA: Diagnosis present

## 2017-01-08 DIAGNOSIS — J439 Emphysema, unspecified: Secondary | ICD-10-CM | POA: Diagnosis present

## 2017-01-08 DIAGNOSIS — E7800 Pure hypercholesterolemia, unspecified: Secondary | ICD-10-CM | POA: Diagnosis present

## 2017-01-08 DIAGNOSIS — J449 Chronic obstructive pulmonary disease, unspecified: Secondary | ICD-10-CM | POA: Diagnosis not present

## 2017-01-08 DIAGNOSIS — N2581 Secondary hyperparathyroidism of renal origin: Secondary | ICD-10-CM | POA: Diagnosis not present

## 2017-01-08 DIAGNOSIS — F17210 Nicotine dependence, cigarettes, uncomplicated: Secondary | ICD-10-CM | POA: Diagnosis present

## 2017-01-08 DIAGNOSIS — R079 Chest pain, unspecified: Secondary | ICD-10-CM | POA: Diagnosis not present

## 2017-01-08 DIAGNOSIS — R197 Diarrhea, unspecified: Secondary | ICD-10-CM | POA: Diagnosis present

## 2017-01-08 DIAGNOSIS — E1143 Type 2 diabetes mellitus with diabetic autonomic (poly)neuropathy: Secondary | ICD-10-CM | POA: Diagnosis present

## 2017-01-08 DIAGNOSIS — E1122 Type 2 diabetes mellitus with diabetic chronic kidney disease: Secondary | ICD-10-CM | POA: Diagnosis present

## 2017-01-08 DIAGNOSIS — Z91018 Allergy to other foods: Secondary | ICD-10-CM

## 2017-01-08 DIAGNOSIS — E1142 Type 2 diabetes mellitus with diabetic polyneuropathy: Secondary | ICD-10-CM | POA: Diagnosis present

## 2017-01-08 DIAGNOSIS — K3184 Gastroparesis: Secondary | ICD-10-CM | POA: Diagnosis present

## 2017-01-08 DIAGNOSIS — E11649 Type 2 diabetes mellitus with hypoglycemia without coma: Secondary | ICD-10-CM | POA: Diagnosis present

## 2017-01-08 DIAGNOSIS — D631 Anemia in chronic kidney disease: Secondary | ICD-10-CM | POA: Diagnosis present

## 2017-01-08 DIAGNOSIS — G8929 Other chronic pain: Secondary | ICD-10-CM | POA: Diagnosis present

## 2017-01-08 DIAGNOSIS — Z9115 Patient's noncompliance with renal dialysis: Secondary | ICD-10-CM

## 2017-01-08 DIAGNOSIS — E875 Hyperkalemia: Principal | ICD-10-CM | POA: Diagnosis present

## 2017-01-08 DIAGNOSIS — Z888 Allergy status to other drugs, medicaments and biological substances status: Secondary | ICD-10-CM

## 2017-01-08 DIAGNOSIS — Z794 Long term (current) use of insulin: Secondary | ICD-10-CM

## 2017-01-08 DIAGNOSIS — E1151 Type 2 diabetes mellitus with diabetic peripheral angiopathy without gangrene: Secondary | ICD-10-CM | POA: Diagnosis present

## 2017-01-08 DIAGNOSIS — J069 Acute upper respiratory infection, unspecified: Secondary | ICD-10-CM | POA: Diagnosis not present

## 2017-01-08 DIAGNOSIS — Z79899 Other long term (current) drug therapy: Secondary | ICD-10-CM

## 2017-01-08 DIAGNOSIS — Z7952 Long term (current) use of systemic steroids: Secondary | ICD-10-CM

## 2017-01-08 DIAGNOSIS — E119 Type 2 diabetes mellitus without complications: Secondary | ICD-10-CM | POA: Diagnosis not present

## 2017-01-08 DIAGNOSIS — I120 Hypertensive chronic kidney disease with stage 5 chronic kidney disease or end stage renal disease: Secondary | ICD-10-CM | POA: Diagnosis not present

## 2017-01-08 DIAGNOSIS — R0602 Shortness of breath: Secondary | ICD-10-CM | POA: Diagnosis not present

## 2017-01-08 DIAGNOSIS — R109 Unspecified abdominal pain: Secondary | ICD-10-CM | POA: Diagnosis not present

## 2017-01-08 DIAGNOSIS — R1110 Vomiting, unspecified: Secondary | ICD-10-CM | POA: Diagnosis not present

## 2017-01-08 DIAGNOSIS — Z886 Allergy status to analgesic agent status: Secondary | ICD-10-CM

## 2017-01-08 LAB — BASIC METABOLIC PANEL
Anion gap: 14 (ref 5–15)
BUN: 106 mg/dL — ABNORMAL HIGH (ref 6–20)
CHLORIDE: 102 mmol/L (ref 101–111)
CO2: 18 mmol/L — AB (ref 22–32)
CREATININE: 8.04 mg/dL — AB (ref 0.44–1.00)
Calcium: 7 mg/dL — ABNORMAL LOW (ref 8.9–10.3)
GFR calc non Af Amer: 5 mL/min — ABNORMAL LOW (ref >60)
GFR, EST AFRICAN AMERICAN: 6 mL/min — AB (ref >60)
Glucose, Bld: 109 mg/dL — ABNORMAL HIGH (ref 65–99)
Potassium: 6.4 mmol/L (ref 3.5–5.1)
Sodium: 134 mmol/L — ABNORMAL LOW (ref 135–145)

## 2017-01-08 LAB — COMPREHENSIVE METABOLIC PANEL WITH GFR
ALT: 115 U/L — ABNORMAL HIGH (ref 14–54)
AST: 94 U/L — ABNORMAL HIGH (ref 15–41)
Albumin: 3.8 g/dL (ref 3.5–5.0)
Alkaline Phosphatase: 69 U/L (ref 38–126)
Anion gap: 15 (ref 5–15)
BUN: 102 mg/dL — ABNORMAL HIGH (ref 6–20)
CO2: 18 mmol/L — ABNORMAL LOW (ref 22–32)
Calcium: 7.1 mg/dL — ABNORMAL LOW (ref 8.9–10.3)
Chloride: 102 mmol/L (ref 101–111)
Creatinine, Ser: 7.85 mg/dL — ABNORMAL HIGH (ref 0.44–1.00)
GFR calc Af Amer: 6 mL/min — ABNORMAL LOW
GFR calc non Af Amer: 5 mL/min — ABNORMAL LOW
Glucose, Bld: 85 mg/dL (ref 65–99)
Potassium: 6.6 mmol/L (ref 3.5–5.1)
Sodium: 135 mmol/L (ref 135–145)
Total Bilirubin: 0.8 mg/dL (ref 0.3–1.2)
Total Protein: 8.3 g/dL — ABNORMAL HIGH (ref 6.5–8.1)

## 2017-01-08 LAB — MRSA PCR SCREENING: MRSA by PCR: NEGATIVE

## 2017-01-08 LAB — INFLUENZA PANEL BY PCR (TYPE A & B)
INFLBPCR: NEGATIVE
Influenza A By PCR: NEGATIVE

## 2017-01-08 LAB — CBC
HCT: 35.5 % (ref 35.0–47.0)
Hemoglobin: 11.9 g/dL — ABNORMAL LOW (ref 12.0–16.0)
MCH: 31.8 pg (ref 26.0–34.0)
MCHC: 33.6 g/dL (ref 32.0–36.0)
MCV: 94.7 fL (ref 80.0–100.0)
Platelets: 146 10*3/uL — ABNORMAL LOW (ref 150–440)
RBC: 3.75 MIL/uL — ABNORMAL LOW (ref 3.80–5.20)
RDW: 13.9 % (ref 11.5–14.5)
WBC: 8.2 10*3/uL (ref 3.6–11.0)

## 2017-01-08 LAB — GLUCOSE, CAPILLARY
GLUCOSE-CAPILLARY: 67 mg/dL (ref 65–99)
GLUCOSE-CAPILLARY: 96 mg/dL (ref 65–99)
Glucose-Capillary: 56 mg/dL — ABNORMAL LOW (ref 65–99)

## 2017-01-08 LAB — LACTIC ACID, PLASMA: LACTIC ACID, VENOUS: 0.5 mmol/L (ref 0.5–1.9)

## 2017-01-08 LAB — LIPASE, BLOOD: Lipase: 31 U/L (ref 11–51)

## 2017-01-08 MED ORDER — METOCLOPRAMIDE HCL 5 MG/5ML PO SOLN
10.0000 mg | Freq: Three times a day (TID) | ORAL | Status: DC
  Administered 2017-01-09 – 2017-01-11 (×12): 10 mg via ORAL
  Filled 2017-01-08 (×17): qty 10

## 2017-01-08 MED ORDER — SEVELAMER CARBONATE 800 MG PO TABS: 1600.0000 mg | ORAL_TABLET | Freq: Two times a day (BID) | ORAL | Status: DC | PRN

## 2017-01-08 MED ORDER — SODIUM CHLORIDE 0.9% FLUSH: 3.0000 mL | INTRAVENOUS | Status: DC | PRN

## 2017-01-08 MED ORDER — LOPERAMIDE HCL 2 MG PO CAPS
2.0000 mg | ORAL_CAPSULE | Freq: Every day | ORAL | Status: DC
  Administered 2017-01-09 – 2017-01-10 (×2): 2 mg via ORAL
  Filled 2017-01-08 (×2): qty 1

## 2017-01-08 MED ORDER — OXYCODONE HCL 5 MG PO TABS: 5.0000 mg | ORAL_TABLET | Freq: Four times a day (QID) | ORAL | Status: DC | PRN

## 2017-01-08 MED ORDER — DILTIAZEM HCL ER 90 MG PO CP12: 180.0000 mg | ORAL_CAPSULE | Freq: Two times a day (BID) | ORAL | Status: DC

## 2017-01-08 MED ORDER — INSULIN GLARGINE 100 UNIT/ML ~~LOC~~ SOLN
15.0000 [IU] | Freq: Every day | SUBCUTANEOUS | Status: DC
  Filled 2017-01-08: qty 0.15

## 2017-01-08 MED ORDER — HYDRALAZINE HCL 50 MG PO TABS
100.0000 mg | ORAL_TABLET | Freq: Two times a day (BID) | ORAL | Status: DC
  Administered 2017-01-09 – 2017-01-11 (×6): 100 mg via ORAL
  Filled 2017-01-08 (×6): qty 2

## 2017-01-08 MED ORDER — MORPHINE SULFATE (PF) 4 MG/ML IV SOLN
4.0000 mg | Freq: Once | INTRAVENOUS | Status: AC
  Administered 2017-01-08: 4 mg via INTRAVENOUS
  Filled 2017-01-08: qty 1

## 2017-01-08 MED ORDER — PIPERACILLIN-TAZOBACTAM 3.375 G IVPB 30 MIN
3.3750 g | Freq: Once | INTRAVENOUS | Status: DC
Start: 2017-01-08 — End: 2017-01-08

## 2017-01-08 MED ORDER — SODIUM CHLORIDE 0.9% FLUSH
3.0000 mL | Freq: Two times a day (BID) | INTRAVENOUS | Status: DC
  Administered 2017-01-09 – 2017-01-11 (×6): 3 mL via INTRAVENOUS

## 2017-01-08 MED ORDER — OXYCODONE HCL 5 MG PO TABS
5.0000 mg | ORAL_TABLET | ORAL | Status: DC | PRN
  Administered 2017-01-08 – 2017-01-10 (×3): 5 mg via ORAL
  Filled 2017-01-08 (×3): qty 1

## 2017-01-08 MED ORDER — TRAMADOL HCL 50 MG PO TABS: 50.0000 mg | ORAL_TABLET | Freq: Four times a day (QID) | ORAL | Status: DC | PRN

## 2017-01-08 MED ORDER — INSULIN ASPART 100 UNIT/ML ~~LOC~~ SOLN
0.0000 [IU] | Freq: Three times a day (TID) | SUBCUTANEOUS | Status: DC
  Administered 2017-01-09: 3 [IU] via SUBCUTANEOUS
  Administered 2017-01-09: 1 [IU] via SUBCUTANEOUS
  Administered 2017-01-09 – 2017-01-10 (×2): 2 [IU] via SUBCUTANEOUS
  Administered 2017-01-11: 1 [IU] via SUBCUTANEOUS
  Filled 2017-01-08: qty 2
  Filled 2017-01-08: qty 1
  Filled 2017-01-08: qty 3
  Filled 2017-01-08: qty 2
  Filled 2017-01-08: qty 1

## 2017-01-08 MED ORDER — SODIUM CHLORIDE 0.9 % IV SOLN
1.0000 g | Freq: Once | INTRAVENOUS | Status: AC
  Administered 2017-01-08: 1 g via INTRAVENOUS
  Filled 2017-01-08 (×2): qty 10

## 2017-01-08 MED ORDER — ONDANSETRON HCL 4 MG PO TABS: 4.0000 mg | ORAL_TABLET | Freq: Four times a day (QID) | ORAL | Status: DC | PRN

## 2017-01-08 MED ORDER — PROMETHAZINE HCL 25 MG/ML IJ SOLN
25.0000 mg | Freq: Once | INTRAMUSCULAR | Status: AC
  Administered 2017-01-08: 25 mg via INTRAVENOUS
  Filled 2017-01-08: qty 1

## 2017-01-08 MED ORDER — RENA-VITE PO TABS
1.0000 | ORAL_TABLET | Freq: Every day | ORAL | Status: DC
  Administered 2017-01-09 – 2017-01-11 (×4): 1 via ORAL
  Filled 2017-01-08 (×4): qty 1

## 2017-01-08 MED ORDER — ONDANSETRON 4 MG PO TBDP: 4.0000 mg | ORAL_TABLET | Freq: Three times a day (TID) | ORAL | Status: DC | PRN

## 2017-01-08 MED ORDER — ONDANSETRON HCL 4 MG/2ML IJ SOLN: 4.0000 mg | Freq: Four times a day (QID) | INTRAMUSCULAR | Status: DC | PRN

## 2017-01-08 MED ORDER — SODIUM BICARBONATE 8.4 % IV SOLN
50.0000 meq | Freq: Once | INTRAVENOUS | Status: AC
  Administered 2017-01-08: 50 meq via INTRAVENOUS
  Filled 2017-01-08: qty 50

## 2017-01-08 MED ORDER — OXYCODONE HCL ER 10 MG PO T12A
10.0000 mg | EXTENDED_RELEASE_TABLET | Freq: Two times a day (BID) | ORAL | Status: DC
  Administered 2017-01-09 – 2017-01-11 (×5): 10 mg via ORAL
  Filled 2017-01-08 (×5): qty 1

## 2017-01-08 MED ORDER — HEPARIN SODIUM (PORCINE) 5000 UNIT/ML IJ SOLN
5000.0000 [IU] | Freq: Three times a day (TID) | INTRAMUSCULAR | Status: DC
  Filled 2017-01-08 (×4): qty 1

## 2017-01-08 MED ORDER — CALCIUM ACETATE (PHOS BINDER) 667 MG PO CAPS
2001.0000 mg | ORAL_CAPSULE | Freq: Three times a day (TID) | ORAL | Status: DC
Start: 2017-01-08 — End: 2017-01-12
  Administered 2017-01-09 – 2017-01-12 (×8): 2001 mg via ORAL
  Filled 2017-01-08 (×9): qty 3

## 2017-01-08 MED ORDER — FAMOTIDINE 20 MG PO TABS
20.0000 mg | ORAL_TABLET | Freq: Every day | ORAL | Status: DC
  Administered 2017-01-09 – 2017-01-10 (×2): 20 mg via ORAL
  Filled 2017-01-08 (×2): qty 1

## 2017-01-08 MED ORDER — CEFTRIAXONE SODIUM-DEXTROSE 1-3.74 GM-% IV SOLR
1.0000 g | INTRAVENOUS | Status: DC
  Filled 2017-01-08: qty 50

## 2017-01-08 MED ORDER — DEXTROSE 5 % IV SOLN
500.0000 mg | INTRAVENOUS | Status: DC
  Filled 2017-01-08: qty 500

## 2017-01-08 MED ORDER — PREDNISONE 20 MG PO TABS: 60.0000 mg | ORAL_TABLET | Freq: Every day | ORAL | Status: DC

## 2017-01-08 MED ORDER — PROMETHAZINE HCL 25 MG PO TABS: 25.0000 mg | ORAL_TABLET | ORAL | Status: DC | PRN

## 2017-01-08 MED ORDER — SODIUM CHLORIDE 0.9 % IV SOLN: 250.0000 mL | INTRAVENOUS | Status: DC | PRN

## 2017-01-08 MED ORDER — ALPRAZOLAM 0.25 MG PO TABS
0.2500 mg | ORAL_TABLET | Freq: Once | ORAL | Status: AC
Start: 2017-01-09 — End: 2017-01-09
  Administered 2017-01-09: 0.25 mg via ORAL
  Filled 2017-01-08: qty 1

## 2017-01-08 MED ORDER — DILTIAZEM HCL ER 60 MG PO CP12
120.0000 mg | ORAL_CAPSULE | Freq: Two times a day (BID) | ORAL | Status: DC
  Administered 2017-01-09 – 2017-01-11 (×6): 120 mg via ORAL
  Filled 2017-01-08 (×8): qty 2

## 2017-01-08 MED ORDER — SODIUM CHLORIDE 0.9 % IV BOLUS (SEPSIS)
500.0000 mL | Freq: Once | INTRAVENOUS | Status: AC
  Administered 2017-01-08: 500 mL via INTRAVENOUS

## 2017-01-08 MED ORDER — DEXTROSE 5 % IV SOLN: 1.0000 g | INTRAVENOUS | Status: DC

## 2017-01-08 NOTE — Progress Notes (Signed)
[facility]  ROUNDING NOTE   Subjective:   [name] admitted to [facility] on 01/08/2017 for Fever  Patient has potassium of 6.4. Last dialysis was Wednesday.   Objective:  Vital signs in last 24 hours:  Temp:  [98.1 F (36.7 C)] 98.1 F (36.7 C) (01/19 1132) Pulse Rate:  [94] 94 (01/19 1132) Resp:  [20] 20 (01/19 1132) BP: (149)/(73) 149/73 (01/19 1132) SpO2:  [96 %] 96 % (01/19 1132) Weight:  [92.5 kg (204 lb)] 92.5 kg (204 lb) (01/19 1133)  Weight change:  Filed Weights   01/08/17 1133  Weight: 92.5 kg (204 lb)    Intake/Output: No intake/output data recorded.   Intake/Output this shift:  No intake/output data recorded.  Physical Exam: General: NAD, laying in bed  Head: Normocephalic, atraumatic. Moist oral mucosal membranes  Eyes: Anicteric, PERRL  Neck: Supple, trachea midline  Lungs:  Clear to auscultation  Heart: Regular rate and rhythm  Abdomen:  Soft, nontender,   Extremities:  no peripheral edema.  Neurologic: Nonfocal, moving all four extremities  Skin: No lesions  Access: Left arm AVF    Basic Metabolic Panel:  Recent Labs Lab 01/08/17 1134 01/08/17 1316  NA 135 134*  K 6.6* 6.4*  CL 102 102  CO2 18* 18*  GLUCOSE 85 109*  BUN 102* 106*  CREATININE 7.85* 8.04*  CALCIUM 7.1* 7.0*    Liver Function Tests:  Recent Labs Lab 01/08/17 1134  AST 94*  ALT 115*  ALKPHOS 69  BILITOT 0.8  PROT 8.3*  ALBUMIN 3.8    Recent Labs Lab 01/08/17 1134  LIPASE 31   No results for input(s): AMMONIA in the last 168 hours.  CBC:  Recent Labs Lab 01/08/17 1134  WBC 8.2  HGB 11.9*  HCT 35.5  MCV 94.7  PLT 146*    Cardiac Enzymes: No results for input(s): CKTOTAL, CKMB, CKMBINDEX, TROPONINI in the last 168 hours.  BNP: Invalid input(s): POCBNP  CBG: No results for input(s): GLUCAP in the last 168 hours.  Microbiology: Results for orders placed or performed during the hospital encounter of 11/20/16  Urine culture      Status: Abnormal   Collection Time: 11/20/16  7:56 PM  Result Value Ref Range Status   Specimen Description URINE, CLEAN CATCH  Final   Special Requests NONE  Final   Culture >=100,000 COLONIES/mL ESCHERICHIA COLI (A)  Final   Report Status 11/23/2016 FINAL  Final   Organism ID, Bacteria ESCHERICHIA COLI (A)  Final      Susceptibility   Escherichia coli - MIC*    AMPICILLIN >=32 RESISTANT Resistant     CEFAZOLIN <=4 SENSITIVE Sensitive     CEFTRIAXONE <=1 SENSITIVE Sensitive     CIPROFLOXACIN >=4 RESISTANT Resistant     GENTAMICIN <=1 SENSITIVE Sensitive     IMIPENEM <=0.25 SENSITIVE Sensitive     NITROFURANTOIN <=16 SENSITIVE Sensitive     TRIMETH/SULFA >=320 RESISTANT Resistant     AMPICILLIN/SULBACTAM 16 INTERMEDIATE Intermediate     PIP/TAZO <=4 SENSITIVE Sensitive     Extended ESBL NEGATIVE Sensitive     * >=100,000 COLONIES/mL ESCHERICHIA COLI    Coagulation Studies: No results for input(s): LABPROT, INR in the last 72 hours.  Urinalysis: No results for input(s): COLORURINE, LABSPEC, PHURINE, GLUCOSEU, HGBUR, BILIRUBINUR, KETONESUR, PROTEINUR, UROBILINOGEN, NITRITE, LEUKOCYTESUR in the last 72 hours.  Invalid input(s): APPERANCEUR    Imaging: Dg Chest 2 View  Result Date: 01/08/2017 CLINICAL DATA:  Short of breath EXAM:  CHEST  2 VIEW COMPARISON:  12/21/2016 FINDINGS: Interval development of mild patchy airspace disease in the right middle lobe and lingula. Possible pneumonia. Heart size upper normal. Negative for heart failure or edema. No pleural effusion. IMPRESSION: Interval development of mild airspace disease in the right middle lobe and lingula, possible pneumonia. Electronically Signed   By: Franchot Gallo M.D.   On: 01/08/2017 15:27     Medications:   . azithromycin     . calcium acetate  2,001 mg Oral TID WC  . cefTRIAXone  1 g Intravenous Q24H  . diltiazem  120 mg Oral BID  . [START ON 01/09/2017] famotidine  20 mg Oral Daily  . hydrALAZINE  100 mg  Oral BID  . insulin aspart  0-9 Units Subcutaneous TID WC  . insulin glargine  15 Units Subcutaneous QHS  . [START ON 01/09/2017] loperamide  2 mg Oral Daily  . metoCLOPramide  10 mg Oral TID AC & HS  . multivitamin  1 tablet Oral QHS  . oxyCODONE  10 mg Oral Q12H  . sevelamer carbonate  1,600 mg Oral See admin instructions   ondansetron (ZOFRAN) IV, ondansetron, oxyCODONE, promethazine  Assessment/ Plan:  [name] is a 51 y.o. white female with medical history of hypertension, diastolic heart failure, diabetes mellitus type 2, and anemia of chronic kidney disease, hepatitis C, depression, psoriasis, peripheral neuropathy, ESRD, admission for left foot blister/LLE cellulitis   [facility] MWF  1.  End-stage renal disease on dialysis MWF.with hyperkalemia.  - Continue MWF schedule.  - Dialysis for later today. Orders prepared.   2. Anemia chronic and disease: hemoglobin 11.9 - hold epo   3. Secondary hyperparathyroidism with hyperphosphatemia. Outpatient phos 8.1.  -  Continue Renvela 1600 mg by mouth 3 times a day with meals   4. Hypertension. Blood pressure at goal - Continue diltiazem and hydralazine.   LOS: 0 [name] 1/19/20185:32 PM

## 2017-01-08 NOTE — Progress Notes (Signed)
Hemodialysis completed. 

## 2017-01-08 NOTE — Progress Notes (Signed)
Hemodialysis started

## 2017-01-08 NOTE — Progress Notes (Signed)
Post hd tx 
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
## 2017-01-08 NOTE — ED Triage Notes (Signed)
Pt dialysis patient, has missed last tx. Pt also c/o abdominal pain, fever and cough.

## 2017-01-08 NOTE — H&P (Signed)
[facility] NAME: [name]    MR#:  [id number]  DATE OF BIRTH:  09/27/1966  DATE OF ADMISSION:  01/08/2017  PRIMARY CARE PHYSICIAN: Tula Nakayama, MD   REQUESTING/REFERRING PHYSICIAN: Shirlyn Goltz M.D.  CHIEF COMPLAINT:   Chief Complaint  Patient presents with  . Fever  . Emesis  . Nausea  . Diarrhea    HISTORY OF PRESENT ILLNESS: [name]  is a 51 y.o. female with a known history of  End-stage renal disease, gastroparesis, COPD, hepatitis C, essential hypertension was presenting to the ED with nausea vomiting. Patient reports that she believes her dialysis on Wednesday because the dialysis center was closed due to weather. The ER physician discussed the case with the on call nephrologist who recommends admission and hemodialysis today. Patient denies any diarrhea. Complains of fever and body aches. PAST MEDICAL HISTORY:   Past Medical History:  Diagnosis Date  . Anemia   . Anxiety   . Arthritis   . CHF (congestive heart failure) (HCC)    history of, has been cleared.  . Chronic kidney disease   . COPD (chronic obstructive pulmonary disease) ([place])   . Depression   . Diabetes mellitus without complication ([place])   . Emphysema of lung ([place])   . End stage renal disease ([place])   . Headache    migraines  . Hepatitis 2003   Hep C  . Hypercholesterolemia    pt denies  . Hypertension   . Peripheral vascular disease ([place])   . Pneumonia 2015  . Tobacco dependence     PAST SURGICAL HISTORY: Past Surgical History:  Procedure Laterality Date  . AV FISTULA PLACEMENT Left 11/2014  . CHOLECYSTECTOMY    . cyst removed  from left hand Left 1989  . INCISION AND DRAINAGE ABSCESS N/A 07/16/2016   Procedure: INCISION AND DRAINAGE ABSCESS;  Surgeon: Carloyn Manner, MD;  Location: [facility];  Service: ENT;  Laterality: N/A;  . PERIPHERAL VASCULAR CATHETERIZATION N/A 07/25/2015   Procedure: A/V Shuntogram/Fistulagram;  Surgeon:  Algernon Huxley, MD;  Location: [place] [facility];  Service: Cardiovascular;  Laterality: N/A;  . PERIPHERAL VASCULAR CATHETERIZATION Left 07/25/2015   Procedure: A/V Shunt Intervention;  Surgeon: Algernon Huxley, MD;  Location: [place] [facility];  Service: Cardiovascular;  Laterality: Left;  . PERIPHERAL VASCULAR CATHETERIZATION Left 10/07/2015   Procedure: A/V Shuntogram/Fistulagram;  Surgeon: Algernon Huxley, MD;  Location: [place] [facility];  Service: Cardiovascular;  Laterality: Left;  . PERIPHERAL VASCULAR CATHETERIZATION N/A 10/07/2015   Procedure: A/V Shunt Intervention;  Surgeon: Algernon Huxley, MD;  Location: [place] [facility];  Service: Cardiovascular;  Laterality: N/A;  . PERIPHERAL VASCULAR CATHETERIZATION  10/07/2015   Procedure: Dialysis/Perma Catheter Insertion;  Surgeon: Algernon Huxley, MD;  Location: [place] [facility];  Service: Cardiovascular;;  . PERIPHERAL VASCULAR CATHETERIZATION N/A 12/17/2015   Procedure: Dialysis/Perma Catheter Removal;  Surgeon: Katha Cabal, MD;  Location: [place] [facility];  Service: Cardiovascular;  Laterality: N/A;  . rt. tubal and ovary removed    . TONSILLECTOMY      SOCIAL HISTORY:  Social History  Substance Use Topics  . Smoking status: Current Every Day Smoker    Packs/day: 0.25    Years: 17.00    Types: Cigarettes  . Smokeless tobacco: Never Used  . Alcohol use No    FAMILY HISTORY:  Family History  Problem Relation Age of Onset  . Hypertension  Mother   . Heart disease Mother   . Hypertension Father     DRUG ALLERGIES:  Allergies  Allergen Reactions  . Cinnamon Anaphylaxis  . Cinoxate Anaphylaxis  . Tylenol [Acetaminophen] Anaphylaxis  . Ciprofloxacin Diarrhea  . Garlic Hives  . Onion Hives and Swelling  . Prednisone Other (See Comments)    Vaginal blisters & oral blisters    REVIEW OF SYSTEMS:   CONSTITUTIONAL: Positive fever, positive positive fatigue and weakness.  EYES: No blurred or double vision.  EARS,  NOSE, AND THROAT: No tinnitus or ear pain.  RESPIRATORY: No cough, shortness of breath, wheezing or hemoptysis.  CARDIOVASCULAR: No chest pain, orthopnea, edema.  GASTROINTESTINAL: Positive nausea positive vomiting,no diarrhea or abdominal pain.  GENITOURINARY: No dysuria, hematuria.  ENDOCRINE: No polyuria, nocturia,  HEMATOLOGY: No anemia, easy bruising or bleeding SKIN: No rash or lesion. MUSCULOSKELETAL: No joint pain or arthritis.   NEUROLOGIC: No tingling, numbness, weakness.  PSYCHIATRY: No anxiety or depression.   MEDICATIONS AT HOME:  Prior to Admission medications   Medication Sig Start Date End Date Taking? Authorizing Provider  b complex-vitamin c-folic acid (NEPHRO-VITE) 0.8 MG TABS tablet Take 1 tablet by mouth daily. On dialysis days takes in evening and on non-dialysis days morning    Historical Provider, MD  calcium acetate (PHOSLO) 667 MG capsule Take 2,001 mg by mouth 3 (three) times daily with meals.  11/26/16   Historical Provider, MD  clobetasol ointment (TEMOVATE) 4.03 % Apply 1 application topically daily as needed (for psorasis).     Historical Provider, MD  diltiazem (CARDIZEM SR) 120 MG 12 hr capsule Take 120 mg by mouth 2 (two) times daily.  12/03/16   Historical Provider, MD  diltiazem (CARDIZEM SR) 90 MG 12 hr capsule Take 2 capsules (180 mg total) by mouth 2 (two) times daily. Patient not taking: Reported on 01/05/2017 07/18/16   [name], MD  hydrALAZINE (APRESOLINE) 100 MG tablet Take 100 mg by mouth 2 (two) times daily.    Historical Provider, MD  insulin glargine (LANTUS) 100 UNIT/ML injection Inject 15 Units into the skin at bedtime.     Historical Provider, MD  insulin regular (NOVOLIN R,HUMULIN R) 100 units/mL injection Inject 5-10 Units into the skin 3 (three) times daily before meals. Pt uses per sliding scale.    Historical Provider, MD  loperamide (IMODIUM A-D) 2 MG tablet Take 2 mg by mouth daily.     Historical Provider, MD  ondansetron (ZOFRAN  ODT) 4 MG disintegrating tablet Take 1 tablet (4 mg total) by mouth every 8 (eight) hours as needed for nausea or vomiting. Patient not taking: Reported on 01/05/2017 12/21/16   Loney Hering, MD  oxyCODONE (ROXICODONE) 5 MG immediate release tablet Take 1 tablet (5 mg total) by mouth every 8 (eight) hours as needed. Patient not taking: Reported on 12/31/2016 10/19/16 10/19/17  [name], MD  OXYCONTIN 10 MG 12 hr tablet Take 10 mg by mouth every 12 (twelve) hours.  12/23/16   Historical Provider, MD  predniSONE (DELTASONE) 20 MG tablet Take 3 tablets (60 mg total) by mouth daily. Patient not taking: Reported on 01/05/2017 12/21/16   Loney Hering, MD  promethazine (PHENERGAN) 25 MG tablet Take 1 tablet (25 mg total) by mouth every 4 (four) hours as needed for nausea or vomiting. 10/19/16   [name], MD  ranitidine (ZANTAC) 150 MG tablet Take 1 tablet (150 mg total) by mouth 2 (two) times daily. 12/15/16 01/15/17  Kiran  Vicente Males, MD  sevelamer carbonate (RENVELA) 800 MG tablet Take 1,600 mg by mouth See admin instructions. 2 tablets with meals and snacks.    Historical Provider, MD  traMADol (ULTRAM) 50 MG tablet Take 1 tablet (50 mg total) by mouth every 6 (six) hours as needed. Patient not taking: Reported on 01/05/2017 11/20/16 11/20/17  Nance Pear, MD      PHYSICAL EXAMINATION:   VITAL SIGNS: Blood pressure (!) 149/73, pulse 94, temperature 98.1 F (36.7 C), temperature source Oral, resp. rate 20, height 5\' 6"  (1.676 m), weight 204 lb (92.5 kg), SpO2 96 %.  GENERAL:  51 y.o.-year-old patient lying in the bed with no acute distress.  EYES: Pupils equal, round, reactive to light and accommodation. No scleral icterus. Extraocular muscles intact.  HEENT: Head atraumatic, normocephalic. Oropharynx and nasopharynx clear.  NECK:  Supple, no jugular venous distention. No thyroid enlargement, no tenderness.  LUNGS: Normal breath sounds bilaterally, no wheezing, rales,rhonchi or  crepitation. No use of accessory muscles of respiration.  CARDIOVASCULAR: S1, S2 normal. No murmurs, rubs, or gallops.  ABDOMEN: Soft, nontender, nondistended. Bowel sounds present. No organomegaly or mass.  EXTREMITIES: No pedal edema, cyanosis, or clubbing.  NEUROLOGIC: Cranial nerves II through XII are intact. Muscle strength 5/5 in all extremities. Sensation intact. Gait not checked.  PSYCHIATRIC: The patient is alert and oriented x 3.  SKIN: No obvious rash, lesion, or ulcer.   LABORATORY PANEL:   CBC  Recent Labs Lab 01/08/17 1134  WBC 8.2  HGB 11.9*  HCT 35.5  PLT 146*  MCV 94.7  MCH 31.8  MCHC 33.6  RDW 13.9   ------------------------------------------------------------------------------------------------------------------  Chemistries   Recent Labs Lab 01/08/17 1134 01/08/17 1316  NA 135 134*  K 6.6* 6.4*  CL 102 102  CO2 18* 18*  GLUCOSE 85 109*  BUN 102* 106*  CREATININE 7.85* 8.04*  CALCIUM 7.1* 7.0*  AST 94*  --   ALT 115*  --   ALKPHOS 69  --   BILITOT 0.8  --    ------------------------------------------------------------------------------------------------------------------ estimated creatinine clearance is 9.4 mL/min (by C-G formula based on SCr of 8.04 mg/dL (H)). ------------------------------------------------------------------------------------------------------------------ No results for input(s): TSH, T4TOTAL, T3FREE, THYROIDAB in the last 72 hours.  Invalid input(s): FREET3   Coagulation profile No results for input(s): INR, PROTIME in the last 168 hours. ------------------------------------------------------------------------------------------------------------------- No results for input(s): DDIMER in the last 72 hours. -------------------------------------------------------------------------------------------------------------------  Cardiac Enzymes No results for input(s): CKMB, TROPONINI, MYOGLOBIN in the last 168  hours.  Invalid input(s): CK ------------------------------------------------------------------------------------------------------------------ Invalid input(s): POCBNP  ---------------------------------------------------------------------------------------------------------------  Urinalysis    Component Value Date/Time   COLORURINE YELLOW (A) 11/20/2016 1956   APPEARANCEUR HAZY (A) 11/20/2016 1956   APPEARANCEUR Cloudy 10/18/2014 0445   LABSPEC 1.010 11/20/2016 1956   LABSPEC 1.016 10/18/2014 0445   PHURINE 7.0 11/20/2016 1956   GLUCOSEU >500 (A) 11/20/2016 1956   GLUCOSEU >=500 10/18/2014 0445   HGBUR NEGATIVE 11/20/2016 1956   BILIRUBINUR NEGATIVE 11/20/2016 1956   BILIRUBINUR Negative 10/18/2014 0445   KETONESUR NEGATIVE 11/20/2016 1956   PROTEINUR >500 (A) 11/20/2016 1956   NITRITE NEGATIVE 11/20/2016 1956   LEUKOCYTESUR 1+ (A) 11/20/2016 1956   LEUKOCYTESUR 2+ 10/18/2014 0445     RADIOLOGY: No results found.  EKG: Orders placed or performed during the hospital encounter of 01/08/17  . EKG 12-Lead  . EKG 12-Lead    IMPRESSION AND PLAN: Patient is a 51 year old end-stage renal disease patient coming in with nausea vomiting noted to have hyperkalemia  1. Hyperkalemia due to missed dialysis Plan for hemodialysis today  2. Nausea vomiting likely related to her gastroparesis We'll try antiemetics  3. Diabetes type 2 will place on sliding scale insulin and Lantus  4. Fever body aches checked a flu  5. Essential hypertension continue Cardizem and hydralazine  6. Miscellaneous heparin for DVT prophylaxis  All the records are reviewed and case discussed with ED provider. Management plans discussed with the patient, family and they are in agreement.  CODE STATUS: Code Status History    Date Active Date Inactive Code Status Order ID Comments User Context   07/13/2016  3:08 AM 07/14/2016  9:10 AM Full Code [id number]  Saundra Shelling, MD Inpatient   06/06/2016  10:13 PM 06/11/2016 12:51 AM Full Code [id number]  Henreitta Leber, MD Inpatient       TOTAL TIME TAKING CARE OF THIS PATIENT: 21minutes.    Dustin Flock M.D on 01/08/2017 at 3:17 PM  Between 7am to 6pm - Pager - [phone]  After 6pm go to [email or web address] - password [email or web address] [facility]  Office  [phone]  CC: Primary care physician; Tula Nakayama, MD

## 2017-01-08 NOTE — Progress Notes (Signed)
Pre-hd tx 

## 2017-01-08 NOTE — ED Notes (Signed)
Patient states "i came here because I only need dialysis and I have gastroporesis"

## 2017-01-08 NOTE — ED Triage Notes (Signed)
Pt presents with abd pain, n/v/d. Pt missed dialysis due to the weather.

## 2017-01-09 LAB — HEPATITIS B SURFACE ANTIGEN: Hepatitis B Surface Ag: NEGATIVE

## 2017-01-09 LAB — GLUCOSE, CAPILLARY
GLUCOSE-CAPILLARY: 108 mg/dL — AB (ref 65–99)
GLUCOSE-CAPILLARY: 133 mg/dL — AB (ref 65–99)
GLUCOSE-CAPILLARY: 147 mg/dL — AB (ref 65–99)
GLUCOSE-CAPILLARY: 211 mg/dL — AB (ref 65–99)
GLUCOSE-CAPILLARY: 67 mg/dL (ref 65–99)
GLUCOSE-CAPILLARY: 88 mg/dL (ref 65–99)
Glucose-Capillary: 35 mg/dL — CL (ref 65–99)
Glucose-Capillary: 188 mg/dL — ABNORMAL HIGH (ref 65–99)
Glucose-Capillary: 65 mg/dL (ref 65–99)

## 2017-01-09 LAB — BASIC METABOLIC PANEL
ANION GAP: 11 (ref 5–15)
BUN: 44 mg/dL — AB (ref 6–20)
CALCIUM: 7.8 mg/dL — AB (ref 8.9–10.3)
CO2: 29 mmol/L (ref 22–32)
Chloride: 97 mmol/L — ABNORMAL LOW (ref 101–111)
Creatinine, Ser: 4.49 mg/dL — ABNORMAL HIGH (ref 0.44–1.00)
GFR calc Af Amer: 12 mL/min — ABNORMAL LOW (ref >60)
GFR calc non Af Amer: 10 mL/min — ABNORMAL LOW (ref >60)
GLUCOSE: 41 mg/dL — AB (ref 65–99)
Potassium: 4.7 mmol/L (ref 3.5–5.1)
Sodium: 137 mmol/L (ref 135–145)

## 2017-01-09 LAB — CBC
HEMATOCRIT: 34.9 % — AB (ref 35.0–47.0)
Hemoglobin: 12 g/dL (ref 12.0–16.0)
MCH: 32.5 pg (ref 26.0–34.0)
MCHC: 34.3 g/dL (ref 32.0–36.0)
MCV: 94.7 fL (ref 80.0–100.0)
Platelets: 135 10*3/uL — ABNORMAL LOW (ref 150–440)
RBC: 3.69 MIL/uL — ABNORMAL LOW (ref 3.80–5.20)
RDW: 13.5 % (ref 11.5–14.5)
WBC: 5.2 10*3/uL (ref 3.6–11.0)

## 2017-01-09 LAB — HEPATITIS B CORE ANTIBODY, TOTAL: HEP B C TOTAL AB: NEGATIVE

## 2017-01-09 LAB — HEPATITIS B SURFACE ANTIBODY,QUALITATIVE: Hep B S Ab: NONREACTIVE

## 2017-01-09 MED ORDER — AZITHROMYCIN 250 MG PO TABS
500.0000 mg | ORAL_TABLET | ORAL | Status: DC
  Administered 2017-01-10 – 2017-01-11 (×2): 500 mg via ORAL
  Filled 2017-01-09 (×2): qty 2

## 2017-01-09 MED ORDER — CEFTRIAXONE SODIUM-DEXTROSE 1-3.74 GM-% IV SOLR
1.0000 g | INTRAVENOUS | Status: DC
  Administered 2017-01-09 – 2017-01-11 (×3): 1 g via INTRAVENOUS
  Filled 2017-01-09 (×4): qty 50

## 2017-01-09 NOTE — Progress Notes (Signed)
Patient ID: [name], female   DOB: 1966-01-08, 51 y.o.   MRN: [id number]  [place] PROGRESS NOTE  KATLYNN NASER EGB:[id number] DOB: 1966-05-22 DOA: 01/08/2017 PCP: Tula Nakayama, MD  HPI/Subjective: Patient not feeling well. Feels weak. Still having diarrhea. No further nausea or vomiting. Always has abdominal pain.  Objective: Vitals:   01/09/17 0758 01/09/17 1430  BP: (!) 131/59 (!) 111/45  Pulse: 87 79  Resp: 16   Temp: 98.3 F (36.8 C) 98.1 F (36.7 C)    Filed Weights   01/08/17 1815 01/08/17 1920 01/08/17 2300  Weight: 95.3 kg (210 lb) 95.3 kg (210 lb) 93 kg (205 lb 0.4 oz)    ROS: Review of Systems  Constitutional: Negative for chills and fever.  HENT: Positive for congestion.   Eyes: Negative for blurred vision.  Respiratory: Positive for cough. Negative for shortness of breath.   Cardiovascular: Negative for chest pain.  Gastrointestinal: Positive for abdominal pain and diarrhea. Negative for constipation, nausea and vomiting.  Genitourinary: Negative for dysuria.  Musculoskeletal: Negative for joint pain.  Neurological: Negative for dizziness and headaches.   Exam: Physical Exam  Constitutional: She is oriented to person, place, and time.  HENT:  Nose: No mucosal edema.  Mouth/Throat: No oropharyngeal exudate or posterior oropharyngeal edema.  Eyes: Conjunctivae, EOM and lids are normal. Pupils are equal, round, and reactive to light.  Neck: No JVD present. Carotid bruit is not present. No edema present. No thyroid mass and no thyromegaly present.  Cardiovascular: S1 normal and S2 normal.  Exam reveals no gallop.   No murmur heard. Pulses:      Dorsalis pedis pulses are 2+ on the right side, and 2+ on the left side.  Respiratory: No respiratory distress. She has decreased breath sounds in the right lower field and the left lower field. She has no wheezes. She has no rhonchi. She has no rales.  GI: Soft. Bowel sounds are normal. There is  tenderness.  Musculoskeletal:       Right ankle: She exhibits swelling.       Left ankle: She exhibits swelling.  Lymphadenopathy:    She has no cervical adenopathy.  Neurological: She is alert and oriented to person, place, and time. No cranial nerve deficit.  Skin: Skin is warm. No rash noted. Nails show no clubbing.  Psychiatric: She has a normal mood and affect.      Data Reviewed: Basic Metabolic Panel:  Recent Labs Lab 01/08/17 1134 01/08/17 1316 01/09/17 0520  NA 135 134* 137  K 6.6* 6.4* 4.7  CL 102 102 97*  CO2 18* 18* 29  GLUCOSE 85 109* 41*  BUN 102* 106* 44*  CREATININE 7.85* 8.04* 4.49*  CALCIUM 7.1* 7.0* 7.8*   Liver Function Tests:  Recent Labs Lab 01/08/17 1134  AST 94*  ALT 115*  ALKPHOS 69  BILITOT 0.8  PROT 8.3*  ALBUMIN 3.8    Recent Labs Lab 01/08/17 1134  LIPASE 31   No results for input(s): AMMONIA in the last 168 hours. CBC:  Recent Labs Lab 01/08/17 1134 01/09/17 0520  WBC 8.2 5.2  HGB 11.9* 12.0  HCT 35.5 34.9*  MCV 94.7 94.7  PLT 146* 135*    CBG:  Recent Labs Lab 01/09/17 0044 01/09/17 0614 01/09/17 0634 01/09/17 0756 01/09/17 1208  GLUCAP 133* 35* 88 188* 211*    Recent Results (from the past 240 hour(s))  MRSA PCR Screening     Status: None   Collection  Time: 01/08/17  6:21 PM  Result Value Ref Range Status   MRSA by PCR NEGATIVE NEGATIVE Final    Comment:        The GeneXpert MRSA Assay (FDA approved for NASAL specimens only), is one component of a comprehensive MRSA colonization surveillance program. It is not intended to diagnose MRSA infection nor to guide or monitor treatment for MRSA infections.   Blood culture (routine x 2)     Status: None (Preliminary result)   Collection Time: 01/08/17  6:35 PM  Result Value Ref Range Status   Specimen Description BLOOD RIGHT HAND  Final   Special Requests   Final    BOTTLES DRAWN AEROBIC AND ANAEROBIC 11CCAERO,9CCANA   Culture NO GROWTH < 12 HOURS   Final   Report Status PENDING  Incomplete  Blood culture (routine x 2)     Status: None (Preliminary result)   Collection Time: 01/08/17  6:54 PM  Result Value Ref Range Status   Specimen Description BLOOD RIGHT ARM  Final   Special Requests   Final    BOTTLES DRAWN AEROBIC AND ANAEROBIC 10CCAERO,13CCANA   Culture NO GROWTH < 12 HOURS  Final   Report Status PENDING  Incomplete     Studies: Dg Chest 2 View  Result Date: 01/08/2017 CLINICAL DATA:  Short of breath EXAM: CHEST  2 VIEW COMPARISON:  12/21/2016 FINDINGS: Interval development of mild patchy airspace disease in the right middle lobe and lingula. Possible pneumonia. Heart size upper normal. Negative for heart failure or edema. No pleural effusion. IMPRESSION: Interval development of mild airspace disease in the right middle lobe and lingula, possible pneumonia. Electronically Signed   By: Franchot Gallo M.D.   On: 01/08/2017 15:27    Scheduled Meds: . azithromycin  500 mg Intravenous Q24H  . calcium acetate  2,001 mg Oral TID WC  . cefTRIAXone  1 g Intravenous Q24H  . diltiazem  120 mg Oral BID  . famotidine  20 mg Oral Daily  . heparin  5,000 Units Subcutaneous Q8H  . hydrALAZINE  100 mg Oral BID  . insulin aspart  0-9 Units Subcutaneous TID WC  . loperamide  2 mg Oral Daily  . metoCLOPramide  10 mg Oral TID AC & HS  . multivitamin  1 tablet Oral QHS  . oxyCODONE  10 mg Oral Q12H  . sodium chloride flush  3 mL Intravenous Q12H    Assessment/Plan:  1. Hyperkalemia. Treated on admission with dialysis. This has improved. 2. Upper respiratory tract infection placed on Zithromax and Rocephin 3. Diarrhea. Send off stool studies with C. Difficile 4. Type 2 diabetes mellitus with hypoglycemia this morning. Hold Lantus and just use sliding scale 5. Essential hypertension on hydralazine and Cardizem 6. Chronic abdominal pain. Dr. Lucky Cowboy is supposed to do an angiogram on Monday  Code Status:     Code Status Orders         Start     Ordered   01/08/17 1819  Full code  Continuous     01/08/17 1818    Code Status History    Date Active Date Inactive Code Status Order ID Comments User Context   07/13/2016  3:08 AM 07/14/2016  9:10 AM Full Code [id number]  Saundra Shelling, MD Inpatient   06/06/2016 10:13 PM 06/11/2016 12:51 AM Full Code [id number]  Henreitta Leber, MD Inpatient     Disposition Plan: Possible discharge Monday night after procedure and dialysis  Consultants:  Nephrology  Antibiotics:  Rocephin  Zithromax  Time spent: 28 minutes  Loletha Grayer  [employer]

## 2017-01-09 NOTE — Progress Notes (Signed)
CONCERNING: Antibiotic IV to Oral Route Change Policy  RECOMMENDATION: This patient is receiving azithromycin by the intravenous route.  Based on criteria approved by the Pharmacy and Therapeutics Committee, the antibiotic(s) is/are being converted to the equivalent oral dose form(s).   DESCRIPTION: These criteria include:  Patient being treated for a respiratory tract infection, urinary tract infection, cellulitis or clostridium difficile associated diarrhea if on metronidazole  The patient is not neutropenic and does not exhibit a GI malabsorption state  The patient is eating (either orally or via tube) and/or has been taking other orally administered medications for a least 24 hours  The patient is improving clinically and has a Tmax < 100.5  If you have questions about this conversion, please contact the Pharmacy Department  []   [phone] )  [facility] [x]   [phone] )  [facility] []   ( [phone] )  [facility] []   [phone] )  [facility] []   [phone] )  [facility]   MLS 01/09/2017
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
## 2017-01-09 NOTE — Plan of Care (Signed)
Problem: Nutritional: Goal: Ability to make healthy dietary choices will improve Outcome: Progressing Patient was receptive to discussion about her renal diet

## 2017-01-09 NOTE — Progress Notes (Signed)
At [id number] pt asked RN to check her sugar because she felt like she had cold sweats. CBG at 0614 was 35, hypoglycemic protocol was initiated. RN gave pt I can of ginger ale, pt specifically asked for grape juice and peanut butter. 160 oz of grape juice was given to pt. CBG was rechecked at 0634 and was 88. Will continue to monitor pt.   Marykathleen Russi [employer]

## 2017-01-10 ENCOUNTER — Encounter: Payer: Self-pay | Admitting: Radiology

## 2017-01-10 ENCOUNTER — Inpatient Hospital Stay: Payer: Medicare Other

## 2017-01-10 ENCOUNTER — Other Ambulatory Visit: Payer: Self-pay

## 2017-01-10 LAB — GLUCOSE, CAPILLARY
GLUCOSE-CAPILLARY: 242 mg/dL — AB (ref 65–99)
Glucose-Capillary: 102 mg/dL — ABNORMAL HIGH (ref 65–99)
Glucose-Capillary: 157 mg/dL — ABNORMAL HIGH (ref 65–99)
Glucose-Capillary: 111 mg/dL — ABNORMAL HIGH (ref 65–99)

## 2017-01-10 LAB — CBC
HCT: 30.6 % — ABNORMAL LOW (ref 35.0–47.0)
Hemoglobin: 10.5 g/dL — ABNORMAL LOW (ref 12.0–16.0)
MCH: 32.1 pg (ref 26.0–34.0)
MCHC: 34.4 g/dL (ref 32.0–36.0)
MCV: 93.4 fL (ref 80.0–100.0)
PLATELETS: 124 10*3/uL — AB (ref 150–440)
RBC: 3.28 MIL/uL — AB (ref 3.80–5.20)
RDW: 13.6 % (ref 11.5–14.5)
WBC: 6.3 10*3/uL (ref 3.6–11.0)

## 2017-01-10 LAB — DIFFERENTIAL
Basophils Absolute: 0 10*3/uL (ref 0–0.1)
Basophils Relative: 1 %
Eosinophils Absolute: 0.1 10*3/uL (ref 0–0.7)
Eosinophils Relative: 2 %
LYMPHS PCT: 22 %
Lymphs Abs: 1.4 10*3/uL (ref 1.0–3.6)
Monocytes Absolute: 0.9 10*3/uL (ref 0.2–0.9)
Monocytes Relative: 14 %
NEUTROS ABS: 3.9 10*3/uL (ref 1.4–6.5)
Neutrophils Relative %: 61 %

## 2017-01-10 LAB — URINALYSIS, COMPLETE (UACMP) WITH MICROSCOPIC
Bacteria, UA: NONE SEEN
Bilirubin Urine: NEGATIVE
GLUCOSE, UA: 150 mg/dL — AB
HGB URINE DIPSTICK: NEGATIVE
KETONES UR: NEGATIVE mg/dL
LEUKOCYTES UA: NEGATIVE
Nitrite: NEGATIVE
Protein, ur: 100 mg/dL — AB
SQUAMOUS EPITHELIAL / LPF: NONE SEEN
Specific Gravity, Urine: 1.01 (ref 1.005–1.030)
pH: 8 (ref 5.0–8.0)

## 2017-01-10 LAB — RENAL FUNCTION PANEL
ANION GAP: 11 (ref 5–15)
Albumin: 3 g/dL — ABNORMAL LOW (ref 3.5–5.0)
BUN: 68 mg/dL — ABNORMAL HIGH (ref 6–20)
CHLORIDE: 93 mmol/L — AB (ref 101–111)
CO2: 26 mmol/L (ref 22–32)
CREATININE: 6.33 mg/dL — AB (ref 0.44–1.00)
Calcium: 7 mg/dL — ABNORMAL LOW (ref 8.9–10.3)
GFR calc non Af Amer: 7 mL/min — ABNORMAL LOW (ref >60)
GFR, EST AFRICAN AMERICAN: 8 mL/min — AB (ref >60)
Glucose, Bld: 205 mg/dL — ABNORMAL HIGH (ref 65–99)
POTASSIUM: 5.3 mmol/L — AB (ref 3.5–5.1)
Phosphorus: 6.2 mg/dL — ABNORMAL HIGH (ref 2.5–4.6)
Sodium: 130 mmol/L — ABNORMAL LOW (ref 135–145)

## 2017-01-10 MED ORDER — OXYCODONE HCL 5 MG PO TABS
10.0000 mg | ORAL_TABLET | ORAL | Status: DC | PRN
  Administered 2017-01-11: 10 mg via ORAL
  Filled 2017-01-10: qty 2

## 2017-01-10 MED ORDER — ALPRAZOLAM 0.25 MG PO TABS
0.2500 mg | ORAL_TABLET | Freq: Three times a day (TID) | ORAL | Status: DC | PRN
  Administered 2017-01-10 – 2017-01-11 (×3): 0.25 mg via ORAL
  Filled 2017-01-10 (×2): qty 1

## 2017-01-10 MED ORDER — ONDANSETRON HCL 4 MG/2ML IJ SOLN: 4.0000 mg | Freq: Four times a day (QID) | INTRAMUSCULAR | Status: DC | PRN

## 2017-01-10 MED ORDER — SODIUM CHLORIDE 0.9 % IV SOLN: INTRAVENOUS | Status: DC

## 2017-01-10 MED ORDER — METHYLPREDNISOLONE SODIUM SUCC 125 MG IJ SOLR: 125.0000 mg | INTRAMUSCULAR | Status: DC | PRN

## 2017-01-10 MED ORDER — CEFAZOLIN IN D5W 1 GM/50ML IV SOLN
1.0000 g | INTRAVENOUS | Status: AC
  Administered 2017-01-11: 1 g via INTRAVENOUS
  Filled 2017-01-10: qty 50

## 2017-01-10 MED ORDER — HYDROMORPHONE HCL 1 MG/ML IJ SOLN: 1.0000 mg | Freq: Once | INTRAMUSCULAR | Status: DC

## 2017-01-10 MED ORDER — FAMOTIDINE 20 MG PO TABS: 40.0000 mg | ORAL_TABLET | ORAL | Status: DC | PRN

## 2017-01-10 MED ORDER — IOPAMIDOL (ISOVUE-370) INJECTION 76%
75.0000 mL | Freq: Once | INTRAVENOUS | Status: AC | PRN
  Administered 2017-01-10: 75 mL via INTRAVENOUS

## 2017-01-10 NOTE — Progress Notes (Signed)
Pre hd info 

## 2017-01-10 NOTE — Plan of Care (Signed)
Problem: Fluid Volume: Goal: Compliance with measures to maintain balanced fluid volume will improve Outcome: Progressing Educated patient of the need to keep track of her fluid intake

## 2017-01-10 NOTE — Progress Notes (Signed)
Hd start 

## 2017-01-10 NOTE — Progress Notes (Signed)
Post hd assessment 

## 2017-01-10 NOTE — Progress Notes (Signed)
[facility]  ROUNDING NOTE   Subjective:   Emergent hemodialysis Friday night for hyperkalemia.  This morning with chest pain and shortness of breath. CT angiogram of chest done. No PE. However continues to have shortness of breath and anxiety.   Objective:  Vital signs in last 24 hours:  Temp:  [98.1 F (36.7 C)-98.9 F (37.2 C)] 98.7 F (37.1 C) (01/21 0808) Pulse Rate:  [79-90] 90 (01/21 0808) Resp:  [16-19] 16 (01/21 0808) BP: (111-151)/(45-67) 138/55 (01/21 0808) SpO2:  [96 %-97 %] 97 % (01/21 0808)  Weight change:  Filed Weights   01/08/17 1815 01/08/17 1920 01/08/17 2300  Weight: 95.3 kg (210 lb) 95.3 kg (210 lb) 93 kg (205 lb 0.4 oz)    Intake/Output: I/O last 3 completed shifts: In: 360 [P.O.:360] Out: 3300 [Urine:800; Other:2500]   Intake/Output this shift:  No intake/output data recorded.  Physical Exam: General: NAD, laying in bed  Head: Normocephalic, atraumatic. Moist oral mucosal membranes  Eyes: Anicteric, PERRL  Neck: Supple, trachea midline  Lungs:  Bilateral crackles  Heart: Regular rate and rhythm  Abdomen:  Soft, nontender,   Extremities:  no peripheral edema.  Neurologic: Nonfocal, moving all four extremities  Skin: No lesions  Access: Left arm AVF    Basic Metabolic Panel:  Recent Labs Lab 01/08/17 1134 01/08/17 1316 01/09/17 0520  NA 135 134* 137  K 6.6* 6.4* 4.7  CL 102 102 97*  CO2 18* 18* 29  GLUCOSE 85 109* 41*  BUN 102* 106* 44*  CREATININE 7.85* 8.04* 4.49*  CALCIUM 7.1* 7.0* 7.8*    Liver Function Tests:  Recent Labs Lab 01/08/17 1134  AST 94*  ALT 115*  ALKPHOS 69  BILITOT 0.8  PROT 8.3*  ALBUMIN 3.8    Recent Labs Lab 01/08/17 1134  LIPASE 31   No results for input(s): AMMONIA in the last 168 hours.  CBC:  Recent Labs Lab 01/08/17 1134 01/09/17 0520  WBC 8.2 5.2  HGB 11.9* 12.0  HCT 35.5 34.9*  MCV 94.7 94.7  PLT 146* 135*    Cardiac Enzymes: No results for input(s):  CKTOTAL, CKMB, CKMBINDEX, TROPONINI in the last 168 hours.  BNP: Invalid input(s): POCBNP  CBG:  Recent Labs Lab 01/09/17 1638 01/09/17 2150 01/09/17 2155 01/09/17 2224 01/10/17 0806  GLUCAP 147* 65 67 108* 102*    Microbiology: Results for orders placed or performed during the hospital encounter of 01/08/17  MRSA PCR Screening     Status: None   Collection Time: 01/08/17  6:21 PM  Result Value Ref Range Status   MRSA by PCR NEGATIVE NEGATIVE Final    Comment:        The GeneXpert MRSA Assay (FDA approved for NASAL specimens only), is one component of a comprehensive MRSA colonization surveillance program. It is not intended to diagnose MRSA infection nor to guide or monitor treatment for MRSA infections.   Blood culture (routine x 2)     Status: None (Preliminary result)   Collection Time: 01/08/17  6:35 PM  Result Value Ref Range Status   Specimen Description BLOOD RIGHT HAND  Final   Special Requests   Final    BOTTLES DRAWN AEROBIC AND ANAEROBIC 11CCAERO,9CCANA   Culture NO GROWTH 2 DAYS  Final   Report Status PENDING  Incomplete  Blood culture (routine x 2)     Status: None (Preliminary result)   Collection Time: 01/08/17  6:54 PM  Result Value Ref Range Status   Specimen Description  BLOOD RIGHT ARM  Final   Special Requests   Final    BOTTLES DRAWN AEROBIC AND ANAEROBIC [place]   Culture NO GROWTH 2 DAYS  Final   Report Status PENDING  Incomplete    Coagulation Studies: No results for input(s): LABPROT, INR in the last 72 hours.  Urinalysis:  Recent Labs  01/10/17 0650  COLORURINE YELLOW*  LABSPEC 1.010  PHURINE 8.0  GLUCOSEU 150*  HGBUR NEGATIVE  BILIRUBINUR NEGATIVE  KETONESUR NEGATIVE  PROTEINUR 100*  NITRITE NEGATIVE  LEUKOCYTESUR NEGATIVE      Imaging: Dg Chest 2 View  Result Date: 01/08/2017 CLINICAL DATA:  Short of breath EXAM: CHEST  2 VIEW COMPARISON:  12/21/2016 FINDINGS: Interval development of mild patchy airspace  disease in the right middle lobe and lingula. Possible pneumonia. Heart size upper normal. Negative for heart failure or edema. No pleural effusion. IMPRESSION: Interval development of mild airspace disease in the right middle lobe and lingula, possible pneumonia. Electronically Signed   By: Franchot Gallo M.D.   On: 01/08/2017 15:27   Ct Angio Chest Pe W Or Wo Contrast  Result Date: 01/10/2017 CLINICAL DATA:  Sudden onset of chest pain. EXAM: CT ANGIOGRAPHY CHEST WITH CONTRAST TECHNIQUE: Multidetector CT imaging of the chest was performed using the standard protocol during bolus administration of intravenous contrast. Multiplanar CT image reconstructions and MIPs were obtained to evaluate the vascular anatomy. CONTRAST:  75 cc of Isovue 370 COMPARISON:  06/10/2016 FINDINGS: Cardiovascular: Aortic atherosclerosis. Calcifications in the RCA, LAD and left circumflex coronary artery noted. No pericardial effusion. The main pulmonary artery is patent without saddle embolus. No lobar or segmental pulmonary artery filling defects identified. Mediastinum/Nodes: Prominent mediastinal and hilar lymph nodes identified. Index left hilar node measures 1.2 cm, image 124 of series 5. Thyroid gland, trachea, and esophagus demonstrate no significant findings. Lungs/Pleura: There is no pleural fluid identified. Subsegmental atelectasis is noted within both lower lobes. No airspace consolidation. Upper Abdomen: No acute abnormality noted. Musculoskeletal: There is degenerative disc disease noted within the thoracic spine. There is no aggressive lytic or sclerotic bone lesions identified. Review of the MIP images confirms the above findings. IMPRESSION: 1. No evidence for acute pulmonary embolus. 2. Aortic atherosclerosis and 3 vessel coronary artery calcification. 3. Subsegmental atelectasis is noted within both lung bases. Electronically Signed   By: Kerby Moors M.D.   On: 01/10/2017 10:41     Medications:    .  azithromycin  500 mg Oral Q24H  . calcium acetate  2,001 mg Oral TID WC  . cefTRIAXone  1 g Intravenous Q24H  . diltiazem  120 mg Oral BID  . famotidine  20 mg Oral Daily  . heparin  5,000 Units Subcutaneous Q8H  . hydrALAZINE  100 mg Oral BID  . insulin aspart  0-9 Units Subcutaneous TID WC  . loperamide  2 mg Oral Daily  . metoCLOPramide  10 mg Oral TID AC & HS  . multivitamin  1 tablet Oral QHS  . oxyCODONE  10 mg Oral Q12H  . sodium chloride flush  3 mL Intravenous Q12H   sodium chloride, ALPRAZolam, ondansetron **OR** ondansetron (ZOFRAN) IV, ondansetron, oxyCODONE, oxyCODONE, promethazine, sevelamer carbonate, sodium chloride flush  Assessment/ Plan:  Ms. LEEANNE BUTTERS is a 51 y.o. white female with medical history of hypertension, diastolic heart failure, diabetes mellitus type 2, and anemia of chronic kidney disease, hepatitis C, depression, psoriasis, peripheral neuropathy, ESRD, admission for left foot blister/LLE cellulitis   [facility]  MWF  1.  End-stage renal disease on dialysis MWF. with hyperkalemia on admission.  - Emergent dialysis again today for pulmonary edema.  - Angiogram is scheduled for tomorrow. Consult vascular surgery.  - Dialysis today and then again tomorrow after vascular procedure.   2. Anemia chronic and disease: hemoglobin 12 - hold epo   3. Secondary hyperparathyroidism with hyperphosphatemia. Outpatient phos 8.1.  - Continue Renvela 1600 mg by mouth 3 times a day with meals   4. Hypertension. Blood pressure at goal - Continue diltiazem and hydralazine.   LOS: 2 Ashanti Littles 1/21/201811:39 AM

## 2017-01-10 NOTE — Plan of Care (Signed)
Problem: Nutritional: Goal: Ability to make healthy dietary choices will improve Outcome: Progressing Re-enforced education on healthy dietary choices

## 2017-01-10 NOTE — Progress Notes (Signed)
Pt unable to void and feels pressure, bladder scan > 945ml, orders taken

## 2017-01-10 NOTE — Progress Notes (Signed)
Post hd vitals 

## 2017-01-10 NOTE — Progress Notes (Signed)
  End of hd 

## 2017-01-10 NOTE — Consult Note (Signed)
Patient well known to service.  Patient with abdominal pain of unknown etiology. Possible concern of mesenteric arterial pathology.  Admitted for hyperkalemia,chest pain, nausea, vomiting and diarrhea. Urgent HD.  Scheduled for mesenteric angio tomorrow per Dr. Dew(Please see note from 12/31/2016).  If patient otherwise stable without further chest pain or other symptomology will tentatively plan to proceed with angio on 01/11/2017.   NPO after midnight for now. Discussed with Dr. [name].

## 2017-01-10 NOTE — Progress Notes (Signed)
Pre hd assessment  

## 2017-01-10 NOTE — Progress Notes (Signed)
Patient ID: [name], female   DOB: May 27, 1966, 51 y.o.   MRN: [id number]  [facility] PROGRESS NOTE  [name] VQQ:[id number] DOB: Jun 12, 1966 DOA: 01/08/2017 PCP: Tula Nakayama, MD  HPI/Subjective: Called to see the patient this morning about chest pain. EKG reviewed and unremarkable. Chest pain described as under her right breast and severe pain in nature and worse with a deep breath. It's been going on since 4 in the morning.  Objective: Vitals:   01/10/17 1311 01/10/17 1403  BP: (!) 123/48 (!) 123/48  Pulse: 85 85  Resp: 18   Temp: 98.8 F (37.1 C) 98.8 F (37.1 C)    Filed Weights   01/08/17 1815 01/08/17 1920 01/08/17 2300  Weight: 95.3 kg (210 lb) 95.3 kg (210 lb) 93 kg (205 lb 0.4 oz)    ROS: Review of Systems  Constitutional: Negative for chills and fever.  HENT: Positive for congestion.   Eyes: Negative for blurred vision.  Respiratory: Positive for cough. Negative for shortness of breath.   Cardiovascular: Positive for chest pain.  Gastrointestinal: Positive for abdominal pain and diarrhea. Negative for constipation, nausea and vomiting.  Genitourinary: Negative for dysuria.  Musculoskeletal: Negative for joint pain.  Neurological: Negative for dizziness and headaches.   Exam: Physical Exam  Constitutional: She is oriented to person, place, and time.  HENT:  Nose: No mucosal edema.  Mouth/Throat: No oropharyngeal exudate or posterior oropharyngeal edema.  Eyes: Conjunctivae, EOM and lids are normal. Pupils are equal, round, and reactive to light.  Neck: No JVD present. Carotid bruit is not present. No edema present. No thyroid mass and no thyromegaly present.  Cardiovascular: S1 normal and S2 normal.  Exam reveals no gallop.   No murmur heard. Pulses:      Dorsalis pedis pulses are 2+ on the right side, and 2+ on the left side.  Respiratory: No respiratory distress. She has decreased breath sounds in the right lower field and the left lower  field. She has no wheezes. She has no rhonchi. She has no rales.  GI: Soft. Bowel sounds are normal. There is tenderness.  Musculoskeletal:       Right ankle: She exhibits swelling.       Left ankle: She exhibits swelling.  Lymphadenopathy:    She has no cervical adenopathy.  Neurological: She is alert and oriented to person, place, and time. No cranial nerve deficit.  Skin: Skin is warm. No rash noted. Nails show no clubbing.  Psychiatric: She has a normal mood and affect.      Data Reviewed: Basic Metabolic Panel:  Recent Labs Lab 01/08/17 1134 01/08/17 1316 01/09/17 0520  NA 135 134* 137  K 6.6* 6.4* 4.7  CL 102 102 97*  CO2 18* 18* 29  GLUCOSE 85 109* 41*  BUN 102* 106* 44*  CREATININE 7.85* 8.04* 4.49*  CALCIUM 7.1* 7.0* 7.8*   Liver Function Tests:  Recent Labs Lab 01/08/17 1134  AST 94*  ALT 115*  ALKPHOS 69  BILITOT 0.8  PROT 8.3*  ALBUMIN 3.8    Recent Labs Lab 01/08/17 1134  LIPASE 31   CBC:  Recent Labs Lab 01/08/17 1134 01/09/17 0520  WBC 8.2 5.2  HGB 11.9* 12.0  HCT 35.5 34.9*  MCV 94.7 94.7  PLT 146* 135*    CBG:  Recent Labs Lab 01/09/17 2150 01/09/17 2155 01/09/17 2224 01/10/17 0806 01/10/17 1149  GLUCAP 65 67 108* 102* 157*    Recent Results (from the past 240  hour(s))  MRSA PCR Screening     Status: None   Collection Time: 01/08/17  6:21 PM  Result Value Ref Range Status   MRSA by PCR NEGATIVE NEGATIVE Final    Comment:        The GeneXpert MRSA Assay (FDA approved for NASAL specimens only), is one component of a comprehensive MRSA colonization surveillance program. It is not intended to diagnose MRSA infection nor to guide or monitor treatment for MRSA infections.   Blood culture (routine x 2)     Status: None (Preliminary result)   Collection Time: 01/08/17  6:35 PM  Result Value Ref Range Status   Specimen Description BLOOD RIGHT HAND  Final   Special Requests   Final    BOTTLES DRAWN AEROBIC AND  ANAEROBIC 11CCAERO,9CCANA   Culture NO GROWTH 2 DAYS  Final   Report Status PENDING  Incomplete  Blood culture (routine x 2)     Status: None (Preliminary result)   Collection Time: 01/08/17  6:54 PM  Result Value Ref Range Status   Specimen Description BLOOD RIGHT ARM  Final   Special Requests   Final    BOTTLES DRAWN AEROBIC AND ANAEROBIC 10CCAERO,13CCANA   Culture NO GROWTH 2 DAYS  Final   Report Status PENDING  Incomplete     Studies: Dg Chest 2 View  Result Date: 01/08/2017 CLINICAL DATA:  Short of breath EXAM: CHEST  2 VIEW COMPARISON:  12/21/2016 FINDINGS: Interval development of mild patchy airspace disease in the right middle lobe and lingula. Possible pneumonia. Heart size upper normal. Negative for heart failure or edema. No pleural effusion. IMPRESSION: Interval development of mild airspace disease in the right middle lobe and lingula, possible pneumonia. Electronically Signed   By: Franchot Gallo M.D.   On: 01/08/2017 15:27   Ct Angio Chest Pe W Or Wo Contrast  Result Date: 01/10/2017 CLINICAL DATA:  Sudden onset of chest pain. EXAM: CT ANGIOGRAPHY CHEST WITH CONTRAST TECHNIQUE: Multidetector CT imaging of the chest was performed using the standard protocol during bolus administration of intravenous contrast. Multiplanar CT image reconstructions and MIPs were obtained to evaluate the vascular anatomy. CONTRAST:  75 cc of Isovue 370 COMPARISON:  06/10/2016 FINDINGS: Cardiovascular: Aortic atherosclerosis. Calcifications in the RCA, LAD and left circumflex coronary artery noted. No pericardial effusion. The main pulmonary artery is patent without saddle embolus. No lobar or segmental pulmonary artery filling defects identified. Mediastinum/Nodes: Prominent mediastinal and hilar lymph nodes identified. Index left hilar node measures 1.2 cm, image 124 of series 5. Thyroid gland, trachea, and esophagus demonstrate no significant findings. Lungs/Pleura: There is no pleural fluid  identified. Subsegmental atelectasis is noted within both lower lobes. No airspace consolidation. Upper Abdomen: No acute abnormality noted. Musculoskeletal: There is degenerative disc disease noted within the thoracic spine. There is no aggressive lytic or sclerotic bone lesions identified. Review of the MIP images confirms the above findings. IMPRESSION: 1. No evidence for acute pulmonary embolus. 2. Aortic atherosclerosis and 3 vessel coronary artery calcification. 3. Subsegmental atelectasis is noted within both lung bases. Electronically Signed   By: Kerby Moors M.D.   On: 01/10/2017 10:41    Scheduled Meds: . azithromycin  500 mg Oral Q24H  . calcium acetate  2,001 mg Oral TID WC  . cefTRIAXone  1 g Intravenous Q24H  . diltiazem  120 mg Oral BID  . famotidine  20 mg Oral Daily  . heparin  5,000 Units Subcutaneous Q8H  . hydrALAZINE  100 mg Oral BID  .  insulin aspart  0-9 Units Subcutaneous TID WC  . loperamide  2 mg Oral Daily  . metoCLOPramide  10 mg Oral TID AC & HS  . multivitamin  1 tablet Oral QHS  . oxyCODONE  10 mg Oral Q12H  . sodium chloride flush  3 mL Intravenous Q12H    Assessment/Plan:  1. Chest pain with shortness of breath. CT angiogram of the chest was ordered stat by me was negative for pulmonary embolism or pneumonia. When necessary pain medications ordered. Doubt this is cardiac in nature. Case discussed with nephrology to dialyze today. 2. Hyperkalemia. Treated on admission with dialysis. This has improved. 3. Upper respiratory tract infection placed on Zithromax and Rocephin 4. Diarrhea. Send off stool studies with C. Difficile. 5. Type 2 diabetes mellitus with hypoglycemia yesterday morning. Hold Lantus and just use sliding scale 6. Essential hypertension on hydralazine and Cardizem 7. Chronic abdominal pain. Dr. Lucky Cowboy is supposed to do an angiogram on Monday. Case discussed with vascular surgery here to let Dr. dew know that the patient is in the hospital. I  will make the patient nothing by mouth after midnight  Code Status:     Code Status Orders        Start     Ordered   01/08/17 1819  Full code  Continuous     01/08/17 1818    Code Status History    Date Active Date Inactive Code Status Order ID Comments User Context   07/13/2016  3:08 AM 07/14/2016  9:10 AM Full Code [id number]  Saundra Shelling, MD Inpatient   06/06/2016 10:13 PM 06/11/2016 12:51 AM Full Code [id number]  Henreitta Leber, MD Inpatient     Disposition Plan: Possible discharge Monday night after procedure and dialysis Versus Tuesday  Consultants:  Nephrology  Antibiotics:  Rocephin  Zithromax  Time spent: 27 minutes  Loletha Grayer  [employer]

## 2017-01-11 ENCOUNTER — Encounter: Payer: Self-pay | Admitting: *Deleted

## 2017-01-11 ENCOUNTER — Other Ambulatory Visit: Payer: Self-pay

## 2017-01-11 ENCOUNTER — Encounter
Admission: EM | Disposition: A | Discharge status: Home / Self Care | Payer: Self-pay | Source: Home / Self Care | Attending: Internal Medicine

## 2017-01-11 ENCOUNTER — Ambulatory Visit: Admission: RE | Admit: 2017-01-11 | Payer: Medicare Other | Source: Ambulatory Visit | Admitting: Vascular Surgery

## 2017-01-11 DIAGNOSIS — B182 Chronic viral hepatitis C: Secondary | ICD-10-CM

## 2017-01-11 DIAGNOSIS — R1084 Generalized abdominal pain: Secondary | ICD-10-CM | POA: Diagnosis not present

## 2017-01-11 HISTORY — PX: PERIPHERAL VASCULAR CATHETERIZATION: SHX172C

## 2017-01-11 LAB — GLUCOSE, CAPILLARY
GLUCOSE-CAPILLARY: 111 mg/dL — AB (ref 65–99)
GLUCOSE-CAPILLARY: 91 mg/dL (ref 65–99)
Glucose-Capillary: 131 mg/dL — ABNORMAL HIGH (ref 65–99)
Glucose-Capillary: 217 mg/dL — ABNORMAL HIGH (ref 65–99)

## 2017-01-11 SURGERY — VISCERAL ANGIOGRAPHY
Anesthesia: Moderate Sedation

## 2017-01-11 MED ORDER — MIDAZOLAM HCL 2 MG/2ML IJ SOLN
INTRAMUSCULAR | Status: DC | PRN
  Administered 2017-01-11: 2 mg via INTRAVENOUS
  Administered 2017-01-11: 0.5 mg via INTRAVENOUS

## 2017-01-11 MED ORDER — DICYCLOMINE HCL 20 MG PO TABS
20.0000 mg | ORAL_TABLET | Freq: Three times a day (TID) | ORAL | Status: DC
  Administered 2017-01-11 – 2017-01-12 (×3): 20 mg via ORAL
  Filled 2017-01-11 (×4): qty 1

## 2017-01-11 MED ORDER — HEPARIN (PORCINE) IN NACL 2-0.9 UNIT/ML-% IJ SOLN
INTRAMUSCULAR | Status: AC
  Filled 2017-01-11: qty 1000

## 2017-01-11 MED ORDER — IOPAMIDOL (ISOVUE-300) INJECTION 61%
INTRAVENOUS | Status: DC | PRN
  Administered 2017-01-11: 40 mL via INTRAVENOUS

## 2017-01-11 MED ORDER — LIDOCAINE-EPINEPHRINE (PF) 2 %-1:200000 IJ SOLN
INTRAMUSCULAR | Status: AC
  Filled 2017-01-11: qty 20

## 2017-01-11 MED ORDER — ALPRAZOLAM 0.5 MG PO TABS
ORAL_TABLET | ORAL | Status: AC
  Filled 2017-01-11: qty 1

## 2017-01-11 MED ORDER — FENTANYL CITRATE (PF) 100 MCG/2ML IJ SOLN
INTRAMUSCULAR | Status: AC
  Filled 2017-01-11: qty 2

## 2017-01-11 MED ORDER — FENTANYL CITRATE (PF) 100 MCG/2ML IJ SOLN
INTRAMUSCULAR | Status: DC | PRN
  Administered 2017-01-11: 50 ug via INTRAVENOUS

## 2017-01-11 MED ORDER — MIDAZOLAM HCL 5 MG/5ML IJ SOLN
INTRAMUSCULAR | Status: AC
  Filled 2017-01-11: qty 5

## 2017-01-11 MED ORDER — HEPARIN SODIUM (PORCINE) 1000 UNIT/ML IJ SOLN
INTRAMUSCULAR | Status: AC
  Filled 2017-01-11: qty 1

## 2017-01-11 MED ORDER — HEPARIN SODIUM (PORCINE) 1000 UNIT/ML IJ SOLN
INTRAMUSCULAR | Status: DC | PRN
  Administered 2017-01-11: 3000 [IU] via INTRAVENOUS

## 2017-01-11 SURGICAL SUPPLY — 11 items
CATH 5FR REUT (CATHETERS) ×2 IMPLANT
CATH C2 65CM (CATHETERS) ×2 IMPLANT
CATH PIG 70CM (CATHETERS) ×2 IMPLANT
DEVICE STARCLOSE SE CLOSURE (Vascular Products) ×2 IMPLANT
DEVICE TORQUE (MISCELLANEOUS) ×2 IMPLANT
GLIDEWIRE ANGLED SS 035X260CM (WIRE) ×2 IMPLANT
PACK ANGIOGRAPHY (CUSTOM PROCEDURE TRAY) ×2 IMPLANT
SHEATH BRITE TIP 5FRX11 (SHEATH) ×2 IMPLANT
SYR MEDRAD MARK V 150ML (SYRINGE) ×2 IMPLANT
TUBING CONTRAST HIGH PRESS 72 (TUBING) ×2 IMPLANT
WIRE J 3MM .035X145CM (WIRE) ×2 IMPLANT

## 2017-01-11 NOTE — Progress Notes (Signed)
Post HD assessment  

## 2017-01-11 NOTE — Progress Notes (Signed)
Patient ID: [name], female   DOB: October 06, 1966, 50 y.o.   MRN: [id number]  [name]  [name]:[id number] DOB: July 17, 1966 DOA: 01/08/2017 PCP: Tula Nakayama, MD  HPI/Subjective: Patient not feeling well. Pain in the groin site from procedure today. Still has not had dialysis yet. Just not feeling well. No bowel movements since diarrhea when she came in.  Objective: Vitals:   01/11/17 1045 01/11/17 1113  BP: (!) 152/72 (!) 143/58  Pulse: 81 79  Resp: 13 16  Temp:  98.1 F (36.7 C)    Filed Weights   01/08/17 2300 01/10/17 1450 01/10/17 1755  Weight: 93 kg (205 lb 0.4 oz) 96.1 kg (211 lb 13.8 oz) 94 kg (207 lb 3.7 oz)    ROS: Review of Systems  Constitutional: Negative for chills and fever.  HENT: Positive for congestion.   Eyes: Negative for blurred vision.  Respiratory: Positive for cough. Negative for shortness of breath.   Cardiovascular: Negative for chest pain.  Gastrointestinal: Positive for abdominal pain. Negative for constipation, diarrhea, nausea and vomiting.  Genitourinary: Negative for dysuria.  Musculoskeletal: Negative for joint pain.  Neurological: Negative for dizziness and headaches.   Exam: Physical Exam  Constitutional: She is oriented to person, place, and time.  HENT:  Nose: No mucosal edema.  Mouth/Throat: No oropharyngeal exudate or posterior oropharyngeal edema.  Eyes: Conjunctivae, EOM and lids are normal. Pupils are equal, round, and reactive to light.  Neck: No JVD present. Carotid bruit is not present. No edema present. No thyroid mass and no thyromegaly present.  Cardiovascular: S1 normal and S2 normal.  Exam reveals no gallop.   No murmur heard. Pulses:      Dorsalis pedis pulses are 2+ on the right side, and 2+ on the left side.  Respiratory: No respiratory distress. She has decreased breath sounds in the right lower field and the left lower field. She has no wheezes. She has no rhonchi. She has no  rales.  GI: Soft. Bowel sounds are normal. There is tenderness.  Musculoskeletal:       Right ankle: She exhibits swelling.       Left ankle: She exhibits swelling.  Lymphadenopathy:    She has no cervical adenopathy.  Neurological: She is alert and oriented to person, place, and time. No cranial nerve deficit.  Skin: Skin is warm. No rash noted. Nails show no clubbing.  Psychiatric: She has a normal mood and affect.      Data Reviewed: Basic Metabolic Panel:  Recent Labs Lab 01/08/17 1134 01/08/17 1316 01/09/17 0520 01/10/17 1455  NA 135 134* 137 130*  K 6.6* 6.4* 4.7 5.3*  CL 102 102 97* 93*  CO2 18* 18* 29 26  GLUCOSE 85 109* 41* 205*  BUN 102* 106* 44* 68*  CREATININE 7.85* 8.04* 4.49* 6.33*  CALCIUM 7.1* 7.0* 7.8* 7.0*  PHOS  --   --   --  6.2*   Liver Function Tests:  Recent Labs Lab 01/08/17 1134 01/10/17 1455  AST 94*  --   ALT 115*  --   ALKPHOS 69  --   BILITOT 0.8  --   PROT 8.3*  --   ALBUMIN 3.8 3.0*    Recent Labs Lab 01/08/17 1134  LIPASE 31   CBC:  Recent Labs Lab 01/08/17 1134 01/09/17 0520 01/10/17 1455  WBC 8.2 5.2 6.3  NEUTROABS  --   --  3.9  HGB 11.9* 12.0 10.5*  HCT 35.5 34.9*  30.6*  MCV 94.7 94.7 93.4  PLT 146* 135* 124*    CBG:  Recent Labs Lab 01/10/17 1149 01/10/17 1812 01/10/17 2132 01/11/17 0739 01/11/17 1136  GLUCAP 157* 111* 242* 111* 91    Recent Results (from the past 240 hour(s))  MRSA PCR Screening     Status: None   Collection Time: 01/08/17  6:21 PM  Result Value Ref Range Status   MRSA by PCR NEGATIVE NEGATIVE Final    Comment:        The GeneXpert MRSA Assay (FDA approved for NASAL specimens only), is one component of a comprehensive MRSA colonization surveillance program. It is not intended to diagnose MRSA infection nor to guide or monitor treatment for MRSA infections.   Blood culture (routine x 2)     Status: None (Preliminary result)   Collection Time: 01/08/17  6:35 PM  Result  Value Ref Range Status   Specimen Description BLOOD RIGHT HAND  Final   Special Requests   Final    BOTTLES DRAWN AEROBIC AND ANAEROBIC 11CCAERO,9CCANA   Culture NO GROWTH 3 DAYS  Final   Report Status PENDING  Incomplete  Blood culture (routine x 2)     Status: None (Preliminary result)   Collection Time: 01/08/17  6:54 PM  Result Value Ref Range Status   Specimen Description BLOOD RIGHT ARM  Final   Special Requests   Final    BOTTLES DRAWN AEROBIC AND ANAEROBIC 10CCAERO,13CCANA   Culture NO GROWTH 3 DAYS  Final   Report Status PENDING  Incomplete     Studies: Ct Angio Chest Pe W Or Wo Contrast  Result Date: 01/10/2017 CLINICAL DATA:  Sudden onset of chest pain. EXAM: CT ANGIOGRAPHY CHEST WITH CONTRAST TECHNIQUE: Multidetector CT imaging of the chest was performed using the standard protocol during bolus administration of intravenous contrast. Multiplanar CT image reconstructions and MIPs were obtained to evaluate the vascular anatomy. CONTRAST:  75 cc of Isovue 370 COMPARISON:  06/10/2016 FINDINGS: Cardiovascular: Aortic atherosclerosis. Calcifications in the RCA, LAD and left circumflex coronary artery noted. No pericardial effusion. The main pulmonary artery is patent without saddle embolus. No lobar or segmental pulmonary artery filling defects identified. Mediastinum/Nodes: Prominent mediastinal and hilar lymph nodes identified. Index left hilar node measures 1.2 cm, image 124 of series 5. Thyroid gland, trachea, and esophagus demonstrate no significant findings. Lungs/Pleura: There is no pleural fluid identified. Subsegmental atelectasis is noted within both lower lobes. No airspace consolidation. Upper Abdomen: No acute abnormality noted. Musculoskeletal: There is degenerative disc disease noted within the thoracic spine. There is no aggressive lytic or sclerotic bone lesions identified. Review of the MIP images confirms the above findings. IMPRESSION: 1. No evidence for acute pulmonary  embolus. 2. Aortic atherosclerosis and 3 vessel coronary artery calcification. 3. Subsegmental atelectasis is noted within both lung bases. Electronically Signed   By: Kerby Moors M.D.   On: 01/10/2017 10:41    Scheduled Meds: . ALPRAZolam      . azithromycin  500 mg Oral Q24H  . calcium acetate  2,001 mg Oral TID WC  . cefTRIAXone  1 g Intravenous Q24H  . diltiazem  120 mg Oral BID  . famotidine  20 mg Oral Daily  . heparin  5,000 Units Subcutaneous Q8H  . hydrALAZINE  100 mg Oral BID  . insulin aspart  0-9 Units Subcutaneous TID WC  . loperamide  2 mg Oral Daily  . metoCLOPramide  10 mg Oral TID AC & HS  . multivitamin  1 tablet Oral QHS  . oxyCODONE  10 mg Oral Q12H  . sodium chloride flush  3 mL Intravenous Q12H    Assessment/Plan:  1. Chest pain with shortness of breath. CT angiogram of the chest was Negative. This pain has resolved. 2. Hyperkalemia. Treated on admission with dialysis. This has improved. 3. Upper respiratory tract infection placed on Zithromax and Rocephin 4. Diarrhea. No further diarrhea since being here but has received Imodium 5. Type 2 diabetes mellitus with hypoglycemia yesterday morning. Hold Lantus and just use sliding scale 6. Essential hypertension on hydralazine and Cardizem 7. Chronic abdominal pain. Angiogram negative. Trial of Bentyl.  Code Status:     Code Status Orders        Start     Ordered   01/08/17 1819  Full code  Continuous     01/08/17 1818    Code Status History    Date Active Date Inactive Code Status Order ID Comments User Context   07/13/2016  3:08 AM 07/14/2016  9:10 AM Full Code [id number]  Saundra Shelling, MD Inpatient   06/06/2016 10:13 PM 06/11/2016 12:51 AM Full Code [id number]  Henreitta Leber, MD Inpatient     Disposition Plan: Possible discharge Tuesday. Still awaiting dialysis today after dye procedure  Consultants:  Nephrology  Antibiotics:  Rocephin  Zithromax  Time spent: 25 minutes  [facility],  [facility]           Patient ID: [name], female   DOB: 17-Oct-1966, 51 y.o.   MRN: [id number]

## 2017-01-11 NOTE — Progress Notes (Signed)
HD completed without issue. UF 2L. Patient tolerated well

## 2017-01-11 NOTE — H&P (Signed)
[facility] History & Physical Update  The patient was interviewed and re-examined.  The patient's previous History and Physical has been reviewed and is unchanged.  There is no change in the plan of care. We plan to proceed with the scheduled procedure.  Leotis Pain, MD  01/11/2017, 8:59 AM

## 2017-01-11 NOTE — Progress Notes (Signed)
HD initiated without issue. No heparin tx. Labs sent per order

## 2017-01-11 NOTE — Progress Notes (Signed)
Pre HD assessment  

## 2017-01-11 NOTE — Progress Notes (Signed)
Pre HD  

## 2017-01-11 NOTE — Op Note (Addendum)
[facility] Percutaneous Study/Intervention Procedural Note   Date:  01/11/2017  Surgeon(s): Leotis Pain, MD  Assistants: none  Pre-operative Diagnosis: 1.  Chronic abdominal pain and weight loss worrisome for chronic mesenteric ischemia. Conflicting findings on CT scan and duplex.   Post-operative diagnosis: Same  Procedure(s) Performed: 1. Ultrasound guidance for vascular access right femoral artery 2. Catheter placement into Celiac artery, SMA, and IMA from right femoral approach 3. Aortogram and selective angiogram of the Celiac, SMA, and IMA 4. StarClose closure device right femoral artery  Contrast: 40 cc  EBL: Minimal  Anesthesia: Approximately 20 minutes of Moderate conscious sedation using 2 mg of Versed and 50 mcg of Fentanyl  Indications: Patient is a 51 y.o. female who has symptoms consistent with mesenteric ischemia. The patient has a CT scan showing calcifications in question stenosis of her celiac and SMA. Although duplex did not show any obvious stenosis, given her continued abdominal pain and weight loss we felt that angiography was indicated at this point. The patient is brought in for angiography for further evaluation and potential treatment. Risks and benefits are discussed and informed consent is obtained  Procedure: The patient was identified and appropriate procedural time out was performed. The patient was then placed supine on the table and prepped and draped in the usual sterile fashion. Moderate conscious sedation was administered during a face to face encounter with the patient throughout the procedure with my supervision of the RN administering medicines and monitoring the patient's vital signs, pulse oximetry, telemetry and mental status throughout from the start of the procedure until the patient was taken to the recovery room. Ultrasound was used to evaluate the  right common femoral artery. It was patent . A digital ultrasound image was acquired. A Seldinger needle was used to access the right common femoral artery under direct ultrasound guidance and a permanent image was performed. A 0.035 J wire was advanced without resistance and a 5Fr sheath was placed. Pigtail catheter was placed into the aorta and an AP aortogram was performed. This demonstrated what appeared to be reasonably good flow in the renal arteries despite her end-stage renal disease, mild tortuosity in the aorta and iliac arteries without stenosis, and what appeared to be flow in the celiac, SMA, and IMA. We transitioned to the lateral projection to image the celiac and SMA. The lateral image demonstrated typical origins of the celiac and SMA without obvious stenosis but further interrogation was performed with selective imaging of both vessels. The patient was given 3000 units of IV heparin.A C2 catheter was used and selectively cannulated the superior mesenteric artery initially. AP and lateral projections were performed showing no significant stenosis with normal flow of the superior mesenteric artery and primary branches. I then cannulated the celiac artery with a C2 catheter and again performed AP and lateral projections. Again, no significant stenosis with normal flow in the celiac artery and its branch vessels was identified. At this point, I switched for a VS 1 catheter and transitioned to a RAO projection to try to image the inferior mesenteric artery. This was seen to have reasonably normal flow and selective imaging was performed after cannulating proximal IMA with the VS 1 catheter. Very mild disease of less than 25% was identified and the IMA without any significant stenosis and brisk flow through the typical IMA branches was identified. At this point, I elected to terminate the procedure. The diagnostic catheter was removed. StarClose closure device was deployed in usual fashion with  excellent hemostatic result. The patient was taken to the recovery room in stable condition having tolerated the procedure well.     Findings:Normal celiac artery, normal SMA, and minimal disease and IMA with brisk flow and no evidence of chronic mesenteric ischemia from large vessel disease.  Disposition: Patient was taken to the recovery room in stable condition having tolerated the procedure well.  Complications: None  Leotis Pain 01/11/2017 10:00 AM   This note was created with Dragon Medical transcription system. Any errors in dictation are purely unintentional.

## 2017-01-11 NOTE — Progress Notes (Signed)
[facility]  ROUNDING NOTE   Subjective:   No  Nausea no SOB - room air Sleepy from pain medication Angiogram is negative for mesenteric ischemia One low grade temp Flu negative   Objective:  Vital signs in last 24 hours:  Temp:  [98 F (36.7 C)-99.3 F (37.4 C)] 98.1 F (36.7 C) (01/22 1113) Pulse Rate:  [77-90] 79 (01/22 1113) Resp:  [8-17] 16 (01/22 1113) BP: (88-170)/(56-82) 143/58 (01/22 1113) SpO2:  [90 %-100 %] 100 % (01/22 1113) Weight:  [94 kg (207 lb 3.7 oz)] 94 kg (207 lb 3.7 oz) (01/21 1755)  Weight change:  Filed Weights   01/08/17 2300 01/10/17 1450 01/10/17 1755  Weight: 93 kg (205 lb 0.4 oz) 96.1 kg (211 lb 13.8 oz) 94 kg (207 lb 3.7 oz)    Intake/Output: I/O last 3 completed shifts: In: 47 [P.O.:480; IV Piggyback:50] Out: 3300 [Urine:800; Other:2500]   Intake/Output this shift:  No intake/output data recorded.  Physical Exam: General: NAD, laying in bed  Head: Normocephalic, atraumatic. Moist oral mucosal membranes  Eyes: Anicteric,   Neck: Supple, trachea midline  Lungs:  Room air, clear b/l  Heart: Regular rate and rhythm  Abdomen:  Soft, nontender,   Extremities:  no peripheral edema.  Neurologic: Nonfocal, moving all four extremities  Skin: No lesions  Access: Left arm AVF    Basic Metabolic Panel:  Recent Labs Lab 01/08/17 1134 01/08/17 1316 01/09/17 0520 01/10/17 1455  NA 135 134* 137 130*  K 6.6* 6.4* 4.7 5.3*  CL 102 102 97* 93*  CO2 18* 18* 29 26  GLUCOSE 85 109* 41* 205*  BUN 102* 106* 44* 68*  CREATININE 7.85* 8.04* 4.49* 6.33*  CALCIUM 7.1* 7.0* 7.8* 7.0*  PHOS  --   --   --  6.2*    Liver Function Tests:  Recent Labs Lab 01/08/17 1134 01/10/17 1455  AST 94*  --   ALT 115*  --   ALKPHOS 69  --   BILITOT 0.8  --   PROT 8.3*  --   ALBUMIN 3.8 3.0*    Recent Labs Lab 01/08/17 1134  LIPASE 31   No results for input(s): AMMONIA in the last 168 hours.  CBC:  Recent Labs Lab  01/08/17 1134 01/09/17 0520 01/10/17 1455  WBC 8.2 5.2 6.3  NEUTROABS  --   --  3.9  HGB 11.9* 12.0 10.5*  HCT 35.5 34.9* 30.6*  MCV 94.7 94.7 93.4  PLT 146* 135* 124*    Cardiac Enzymes: No results for input(s): CKTOTAL, CKMB, CKMBINDEX, TROPONINI in the last 168 hours.  BNP: Invalid input(s): POCBNP  CBG:  Recent Labs Lab 01/10/17 1812 01/10/17 2132 01/11/17 0739 01/11/17 1136 01/11/17 1619  GLUCAP 111* 242* 111* 54 131*    Microbiology: Results for orders placed or performed during the hospital encounter of 01/08/17  MRSA PCR Screening     Status: None   Collection Time: 01/08/17  6:21 PM  Result Value Ref Range Status   MRSA by PCR NEGATIVE NEGATIVE Final    Comment:        The GeneXpert MRSA Assay (FDA approved for NASAL specimens only), is one component of a comprehensive MRSA colonization surveillance program. It is not intended to diagnose MRSA infection nor to guide or monitor treatment for MRSA infections.   Blood culture (routine x 2)     Status: None (Preliminary result)   Collection Time: 01/08/17  6:35 PM  Result Value Ref Range Status  Specimen Description BLOOD RIGHT HAND  Final   Special Requests   Final    BOTTLES DRAWN AEROBIC AND ANAEROBIC 11CCAERO,9CCANA   Culture NO GROWTH 3 DAYS  Final   Report Status PENDING  Incomplete  Blood culture (routine x 2)     Status: None (Preliminary result)   Collection Time: 01/08/17  6:54 PM  Result Value Ref Range Status   Specimen Description BLOOD RIGHT ARM  Final   Special Requests   Final    BOTTLES DRAWN AEROBIC AND ANAEROBIC 10CCAERO,13CCANA   Culture NO GROWTH 3 DAYS  Final   Report Status PENDING  Incomplete    Coagulation Studies: No results for input(s): LABPROT, INR in the last 72 hours.  Urinalysis:  Recent Labs  01/10/17 0650  COLORURINE YELLOW*  LABSPEC 1.010  PHURINE 8.0  GLUCOSEU 150*  HGBUR NEGATIVE  BILIRUBINUR NEGATIVE  KETONESUR NEGATIVE  PROTEINUR 100*   NITRITE NEGATIVE  LEUKOCYTESUR NEGATIVE      Imaging: Ct Angio Chest Pe W Or Wo Contrast  Result Date: 01/10/2017 CLINICAL DATA:  Sudden onset of chest pain. EXAM: CT ANGIOGRAPHY CHEST WITH CONTRAST TECHNIQUE: Multidetector CT imaging of the chest was performed using the standard protocol during bolus administration of intravenous contrast. Multiplanar CT image reconstructions and MIPs were obtained to evaluate the vascular anatomy. CONTRAST:  75 cc of Isovue 370 COMPARISON:  06/10/2016 FINDINGS: Cardiovascular: Aortic atherosclerosis. Calcifications in the RCA, LAD and left circumflex coronary artery noted. No pericardial effusion. The main pulmonary artery is patent without saddle embolus. No lobar or segmental pulmonary artery filling defects identified. Mediastinum/Nodes: Prominent mediastinal and hilar lymph nodes identified. Index left hilar node measures 1.2 cm, image 124 of series 5. Thyroid gland, trachea, and esophagus demonstrate no significant findings. Lungs/Pleura: There is no pleural fluid identified. Subsegmental atelectasis is noted within both lower lobes. No airspace consolidation. Upper Abdomen: No acute abnormality noted. Musculoskeletal: There is degenerative disc disease noted within the thoracic spine. There is no aggressive lytic or sclerotic bone lesions identified. Review of the MIP images confirms the above findings. IMPRESSION: 1. No evidence for acute pulmonary embolus. 2. Aortic atherosclerosis and 3 vessel coronary artery calcification. 3. Subsegmental atelectasis is noted within both lung bases. Electronically Signed   By: Kerby Moors M.D.   On: 01/10/2017 10:41     Medications:    . ALPRAZolam      . azithromycin  500 mg Oral Q24H  . calcium acetate  2,001 mg Oral TID WC  . cefTRIAXone  1 g Intravenous Q24H  . dicyclomine  20 mg Oral TID AC & HS  . diltiazem  120 mg Oral BID  . famotidine  20 mg Oral Daily  . heparin  5,000 Units Subcutaneous Q8H  .  hydrALAZINE  100 mg Oral BID  . insulin aspart  0-9 Units Subcutaneous TID WC  . metoCLOPramide  10 mg Oral TID AC & HS  . multivitamin  1 tablet Oral QHS  . oxyCODONE  10 mg Oral Q12H  . sodium chloride flush  3 mL Intravenous Q12H   sodium chloride, ALPRAZolam, ondansetron **OR** ondansetron (ZOFRAN) IV, ondansetron, oxyCODONE, oxyCODONE, promethazine, sevelamer carbonate, sodium chloride flush  Assessment/ Plan:  [name] is a 51 y.o. white female with medical history of hypertension, diastolic heart failure, diabetes mellitus type 2, and anemia of chronic kidney disease, hepatitis C, depression, psoriasis, peripheral neuropathy, ESRD, admission for left foot blister/LLE cellulitis   [facility] MWF  1.  End-stage renal disease on dialysis MWF. with hyperkalemia on admission.  -  HD later today  2. Anemia chronic and disease: hemoglobin 10.5 - hold epo   3. Secondary hyperparathyroidism with hyperphosphatemia. Outpatient phos 6.2 - Continue Renvela 1600 mg by mouth 3 times a day with meals      LOS: 3 Monty Mccarrell 1/22/20184:25 PM

## 2017-01-12 ENCOUNTER — Encounter: Payer: Self-pay | Admitting: Vascular Surgery

## 2017-01-12 LAB — GLUCOSE, CAPILLARY: Glucose-Capillary: 103 mg/dL — ABNORMAL HIGH (ref 65–99)

## 2017-01-12 MED ORDER — OXYCODONE HCL 5 MG PO TABS: 5.0000 mg | ORAL_TABLET | Freq: Three times a day (TID) | ORAL | 0 refills | Status: DC | PRN

## 2017-01-12 MED ORDER — DICYCLOMINE HCL 20 MG PO TABS: 20.0000 mg | ORAL_TABLET | Freq: Three times a day (TID) | ORAL | 0 refills | Status: DC

## 2017-01-12 MED ORDER — LOPERAMIDE HCL 2 MG PO TABS: 2.0000 mg | ORAL_TABLET | ORAL | 0 refills | Status: DC | PRN

## 2017-01-12 NOTE — Progress Notes (Signed)
01/12/2017 9:45 AM  BP (!) 142/60 (BP Location: Right Arm) Comment: nurse Raquel Sarna) aware  Pulse 78   Temp 98.7 F (37.1 C) (Oral)   Resp 20   Ht 5\' 6"  (1.676 m)   Wt 94 kg (207 lb 3.7 oz)   SpO2 98%   BMI 33.45 kg/m  Patient discharged per MD orders. Discharge instructions reviewed with patient and patient verbalized understanding. IV removed per policy. Prescriptions discussed and given to patient. Awaiting wheelchair to arrive for discharge.  Almedia Balls, RN

## 2017-01-12 NOTE — Discharge Summary (Signed)
[facility] NAME: [name]    MR#:  [id number]  DATE OF BIRTH:  Jun 30, 1966  DATE OF ADMISSION:  01/08/2017 ADMITTING PHYSICIAN: Dustin Flock, MD  DATE OF DISCHARGE: 01/12/2017 10:32 AM  PRIMARY CARE PHYSICIAN: Tula Nakayama, MD    ADMISSION DIAGNOSIS:  Fever Abd Pain   DISCHARGE DIAGNOSIS:  Active Problems:   Hyperkalemia   SECONDARY DIAGNOSIS:   Past Medical History:  Diagnosis Date  . Anemia   . Anxiety   . Arthritis   . CHF (congestive heart failure) (HCC)    history of, has been cleared.  . Chronic kidney disease   . COPD (chronic obstructive pulmonary disease) ([place])   . Depression   . Diabetes mellitus without complication ([place])   . Emphysema of lung ([place])   . End stage renal disease ([place])   . Headache    migraines  . Hepatitis 2003   Hep C  . Hypercholesterolemia    pt denies  . Hypertension   . Peripheral vascular disease ([place])   . Pneumonia 2015  . Tobacco dependence     HOSPITAL COURSE:   1. Hyperkalemia due to missed dialysis. The patient was dialyzed here during the hospital course and potassium improved. 2. Nausea vomiting and diarrhea. This had improved and patient was tolerating diet and had no further diarrhea secondary to Imodium being given. Unfortunately couldn't get stool studies because she had no further bowel movement. 3. Chronic abdominal pain. Angiogram by Dr. dew on Monday was negative. Trial of Bentyl. I explained to the patient that alternating diarrhea and constipation could be IBS. I would try to avoid Imodium and less you really needed. Follow-up with PMD and refer to gastroenterology as outpatient. 4. Type 2 diabetes with hypoglycemia. I was just using sliding scale while here. 5. Upper respiratory tract infection patient was placed on Rocephin and Zithromax. CT scan of the chest did not show pneumonia. Patient finished up antibody course while here. 6. Essential  hypertension on hydralazine and Cardizem 7. End-stage renal disease on dialysis as per nephrology. Patient required dialysis on presentation secondary to hyperkalemia. Patient will follow-up with usual dialysis. Patient had dialysis on Sunday after CT scan and Monday after angiogram. 8. Patient also had an episode of chest pain which I CT scan of the chest and it was negative for pulmonary embolism. No further chest pain after that.  DISCHARGE CONDITIONS:   Satisfactory  CONSULTS OBTAINED:  Treatment Team:  Lavonia Dana, MD  DRUG ALLERGIES:   Allergies  Allergen Reactions  . Cinnamon Anaphylaxis  . Cinoxate Anaphylaxis  . Tylenol [Acetaminophen] Anaphylaxis  . Ciprofloxacin Diarrhea  . Garlic Hives  . Onion Hives and Swelling  . Prednisone Other (See Comments)    Vaginal blisters & oral blisters    DISCHARGE MEDICATIONS:   Discharge Medication List as of 01/12/2017 10:15 AM    START taking these medications   Details  dicyclomine (BENTYL) 20 MG tablet Take 1 tablet (20 mg total) by mouth 4 (four) times daily -  before meals and at bedtime., Starting Tue 01/12/2017, Print      CONTINUE these medications which have CHANGED   Details  loperamide (IMODIUM A-D) 2 MG tablet Take 1 tablet (2 mg total) by mouth every other day as needed for diarrhea or loose stools., Starting Tue 01/12/2017, No Print    oxyCODONE (ROXICODONE) 5 MG immediate release tablet Take 1 tablet (5 mg total) by mouth every  8 (eight) hours as needed., Starting Tue 01/12/2017, Until Wed 01/12/2018, Print      CONTINUE these medications which have NOT CHANGED   Details  b complex-vitamin c-folic acid (NEPHRO-VITE) 0.8 MG TABS tablet Take 1 tablet by mouth daily. On dialysis days takes in evening and on non-dialysis days morning, Historical Med    calcium acetate (PHOSLO) 667 MG capsule Take 2,001 mg by mouth 3 (three) times daily with meals. , Starting Thu 11/26/2016, Historical Med    clobetasol ointment  (TEMOVATE) 7.62 % Apply 1 application topically daily as needed (for psorasis). , Historical Med    diltiazem (CARDIZEM SR) 120 MG 12 hr capsule Take 120 mg by mouth 2 (two) times daily. , Starting Thu 12/03/2016, Historical Med    hydrALAZINE (APRESOLINE) 100 MG tablet Take 100 mg by mouth 2 (two) times daily., Until Discontinued, Historical Med    insulin regular (NOVOLIN R,HUMULIN R) 100 units/mL injection Inject 5-10 Units into the skin 3 (three) times daily before meals. Pt uses per sliding scale., Until Discontinued, Historical Med    oxyCODONE (OXYCONTIN) 10 mg 12 hr tablet Take 10 mg by mouth every 12 (twelve) hours., Historical Med    promethazine (PHENERGAN) 25 MG tablet Take 1 tablet (25 mg total) by mouth every 4 (four) hours as needed for nausea or vomiting., Starting Mon 10/19/2016, Print    ranitidine (ZANTAC) 150 MG tablet Take 1 tablet (150 mg total) by mouth 2 (two) times daily., Starting Tue 12/15/2016, Until Fri 01/15/2017, Normal    sevelamer carbonate (RENVELA) 800 MG tablet Take 1,600 mg by mouth See admin instructions. 2 tablets with meals and snacks., Historical Med      STOP taking these medications     insulin glargine (LANTUS) 100 UNIT/ML injection      ondansetron (ZOFRAN ODT) 4 MG disintegrating tablet      predniSONE (DELTASONE) 20 MG tablet      traMADol (ULTRAM) 50 MG tablet          DISCHARGE INSTRUCTIONS:   Follow-up with PMD one week Follow-up with usual dialysis schedule  If you experience worsening of your admission symptoms, develop shortness of breath, life threatening emergency, suicidal or homicidal thoughts you must seek medical attention immediately by calling 911 or calling your MD immediately  if symptoms less severe.  You Must read complete instructions/literature along with all the possible adverse reactions/side effects for all the Medicines you take and that have been prescribed to you. Take any new Medicines after you have  completely understood and accept all the possible adverse reactions/side effects.   Please note  You were cared for by a hospitalist during your hospital stay. If you have any questions about your discharge medications or the care you received while you were in the hospital after you are discharged, you can call the unit and asked to speak with the hospitalist on call if the hospitalist that took care of you is not available. Once you are discharged, your primary care physician will handle any further medical issues. Please note that NO REFILLS for any discharge medications will be authorized once you are discharged, as it is imperative that you return to your primary care physician (or establish a relationship with a primary care physician if you do not have one) for your aftercare needs so that they can reassess your need for medications and monitor your lab values.    Today   CHIEF COMPLAINT:   Chief Complaint  Patient presents with  .  Fever  . Emesis  . Nausea  . Diarrhea    HISTORY OF PRESENT ILLNESS:  [name]  is a 51 y.o. female with a known history of Presented with nausea vomiting and diarrhea   VITAL SIGNS:  Blood pressure (!) 142/60, pulse 78, temperature 98.7 F (37.1 C), temperature source Oral, resp. rate 20, height 5\' 6"  (1.676 m), weight 94 kg (207 lb 3.7 oz), SpO2 98 %.    PHYSICAL EXAMINATION:  GENERAL:  51 y.o.-year-old patient lying in the bed with no acute distress.  EYES: Pupils equal, round, reactive to light and accommodation. No scleral icterus. Extraocular muscles intact.  HEENT: Head atraumatic, normocephalic. Oropharynx and nasopharynx clear.  NECK:  Supple, no jugular venous distention. No thyroid enlargement, no tenderness.  LUNGS: Normal breath sounds bilaterally, no wheezing, rales,rhonchi or crepitation. No use of accessory muscles of respiration.  CARDIOVASCULAR: S1, S2 normal. No murmurs, rubs, or gallops.  ABDOMEN: Soft, non-tender,  non-distended. Bowel sounds present. No organomegaly or mass.  EXTREMITIES: No pedal edema, cyanosis, or clubbing.  NEUROLOGIC: Cranial nerves II through XII are intact. Muscle strength 5/5 in all extremities. Sensation intact. Gait not checked.  PSYCHIATRIC: The patient is alert and oriented x 3.  SKIN: No obvious rash, lesion, or ulcer.   DATA REVIEW:   CBC  Recent Labs Lab 01/10/17 1455  WBC 6.3  HGB 10.5*  HCT 30.6*  PLT 124*    Chemistries   Recent Labs Lab 01/08/17 1134  01/10/17 1455  NA 135  < > 130*  K 6.6*  < > 5.3*  CL 102  < > 93*  CO2 18*  < > 26  GLUCOSE 85  < > 205*  BUN 102*  < > 68*  CREATININE 7.85*  < > 6.33*  CALCIUM 7.1*  < > 7.0*  AST 94*  --   --   ALT 115*  --   --   ALKPHOS 69  --   --   BILITOT 0.8  --   --   < > = values in this interval not displayed.   Microbiology Results  Results for orders placed or performed during the hospital encounter of 01/08/17  MRSA PCR Screening     Status: None   Collection Time: 01/08/17  6:21 PM  Result Value Ref Range Status   MRSA by PCR NEGATIVE NEGATIVE Final    Comment:        The GeneXpert MRSA Assay (FDA approved for NASAL specimens only), is one component of a comprehensive MRSA colonization surveillance program. It is not intended to diagnose MRSA infection nor to guide or monitor treatment for MRSA infections.   Blood culture (routine x 2)     Status: None (Preliminary result)   Collection Time: 01/08/17  6:35 PM  Result Value Ref Range Status   Specimen Description BLOOD RIGHT HAND  Final   Special Requests   Final    BOTTLES DRAWN AEROBIC AND ANAEROBIC 11CCAERO,9CCANA   Culture NO GROWTH 4 DAYS  Final   Report Status PENDING  Incomplete  Blood culture (routine x 2)     Status: None (Preliminary result)   Collection Time: 01/08/17  6:54 PM  Result Value Ref Range Status   Specimen Description BLOOD RIGHT ARM  Final   Special Requests   Final    BOTTLES DRAWN AEROBIC AND  ANAEROBIC 10CCAERO,13CCANA   Culture NO GROWTH 4 DAYS  Final   Report Status PENDING  Incomplete  Management plans discussed with the patient, And she is in agreement.  CODE STATUS:  Code Status History    Date Active Date Inactive Code Status Order ID Comments User Context   01/08/2017  6:18 PM 01/12/2017  1:32 PM Full Code [id number]  Dustin Flock, MD Inpatient   07/13/2016  3:08 AM 07/14/2016  9:10 AM Full Code [id number]  Saundra Shelling, MD Inpatient   06/06/2016 10:13 PM 06/11/2016 12:51 AM Full Code [id number]  Henreitta Leber, MD Inpatient      TOTAL TIME TAKING CARE OF THIS PATIENT: 35 minutes.    Loletha Grayer M.D on 01/12/2017 at 6:27 PM  Between 7am to 6pm - Pager - [phone]  After 6pm go to [email or web address] - password [email or web address] [facility]  [phone]  CC: Primary care physician; Tula Nakayama, MD

## 2017-01-13 DIAGNOSIS — D631 Anemia in chronic kidney disease: Secondary | ICD-10-CM | POA: Diagnosis not present

## 2017-01-13 DIAGNOSIS — N186 End stage renal disease: Secondary | ICD-10-CM | POA: Diagnosis not present

## 2017-01-13 DIAGNOSIS — Z992 Dependence on renal dialysis: Secondary | ICD-10-CM | POA: Diagnosis not present

## 2017-01-13 DIAGNOSIS — D509 Iron deficiency anemia, unspecified: Secondary | ICD-10-CM | POA: Diagnosis not present

## 2017-01-13 LAB — CULTURE, BLOOD (ROUTINE X 2)
CULTURE: NO GROWTH
Culture: NO GROWTH

## 2017-01-15 ENCOUNTER — Telehealth: Payer: Self-pay

## 2017-01-15 DIAGNOSIS — Z992 Dependence on renal dialysis: Secondary | ICD-10-CM | POA: Diagnosis not present

## 2017-01-15 DIAGNOSIS — D509 Iron deficiency anemia, unspecified: Secondary | ICD-10-CM | POA: Diagnosis not present

## 2017-01-15 DIAGNOSIS — D631 Anemia in chronic kidney disease: Secondary | ICD-10-CM | POA: Diagnosis not present

## 2017-01-15 DIAGNOSIS — N186 End stage renal disease: Secondary | ICD-10-CM | POA: Diagnosis not present

## 2017-01-15 NOTE — Telephone Encounter (Signed)
Pt notified we need the repeat lab for her viral load. Pt will go as soon as she can.

## 2017-01-15 NOTE — Telephone Encounter (Signed)
-----   Message from Jonathon Bellows, MD sent at 12/29/2016  8:21 AM EST ----- Blood qty was insufficient for viral load - needs repeat

## 2017-01-18 DIAGNOSIS — M5416 Radiculopathy, lumbar region: Secondary | ICD-10-CM | POA: Diagnosis not present

## 2017-01-18 DIAGNOSIS — M797 Fibromyalgia: Secondary | ICD-10-CM | POA: Diagnosis not present

## 2017-01-18 DIAGNOSIS — D509 Iron deficiency anemia, unspecified: Secondary | ICD-10-CM | POA: Diagnosis not present

## 2017-01-18 DIAGNOSIS — M5440 Lumbago with sciatica, unspecified side: Secondary | ICD-10-CM | POA: Diagnosis not present

## 2017-01-18 DIAGNOSIS — N186 End stage renal disease: Secondary | ICD-10-CM | POA: Diagnosis not present

## 2017-01-18 DIAGNOSIS — D631 Anemia in chronic kidney disease: Secondary | ICD-10-CM | POA: Diagnosis not present

## 2017-01-18 DIAGNOSIS — Z992 Dependence on renal dialysis: Secondary | ICD-10-CM | POA: Diagnosis not present

## 2017-01-19 DIAGNOSIS — E119 Type 2 diabetes mellitus without complications: Secondary | ICD-10-CM | POA: Diagnosis not present

## 2017-01-19 DIAGNOSIS — Z1211 Encounter for screening for malignant neoplasm of colon: Secondary | ICD-10-CM | POA: Diagnosis not present

## 2017-01-19 DIAGNOSIS — I10 Essential (primary) hypertension: Secondary | ICD-10-CM | POA: Diagnosis not present

## 2017-01-20 DIAGNOSIS — D509 Iron deficiency anemia, unspecified: Secondary | ICD-10-CM | POA: Diagnosis not present

## 2017-01-20 DIAGNOSIS — N186 End stage renal disease: Secondary | ICD-10-CM | POA: Diagnosis not present

## 2017-01-20 DIAGNOSIS — D631 Anemia in chronic kidney disease: Secondary | ICD-10-CM | POA: Diagnosis not present

## 2017-01-20 DIAGNOSIS — Z992 Dependence on renal dialysis: Secondary | ICD-10-CM | POA: Diagnosis not present

## 2017-01-22 DIAGNOSIS — N186 End stage renal disease: Secondary | ICD-10-CM | POA: Diagnosis not present

## 2017-01-22 DIAGNOSIS — D509 Iron deficiency anemia, unspecified: Secondary | ICD-10-CM | POA: Diagnosis not present

## 2017-01-22 DIAGNOSIS — D631 Anemia in chronic kidney disease: Secondary | ICD-10-CM | POA: Diagnosis not present

## 2017-01-22 DIAGNOSIS — Z992 Dependence on renal dialysis: Secondary | ICD-10-CM | POA: Diagnosis not present

## 2017-01-25 DIAGNOSIS — D509 Iron deficiency anemia, unspecified: Secondary | ICD-10-CM | POA: Diagnosis not present

## 2017-01-25 DIAGNOSIS — D631 Anemia in chronic kidney disease: Secondary | ICD-10-CM | POA: Diagnosis not present

## 2017-01-25 DIAGNOSIS — Z992 Dependence on renal dialysis: Secondary | ICD-10-CM | POA: Diagnosis not present

## 2017-01-25 DIAGNOSIS — N186 End stage renal disease: Secondary | ICD-10-CM | POA: Diagnosis not present

## 2017-01-26 LAB — MISC LABCORP TEST (SEND OUT): Labcorp test code: 820189

## 2017-01-27 DIAGNOSIS — Z992 Dependence on renal dialysis: Secondary | ICD-10-CM | POA: Diagnosis not present

## 2017-01-27 DIAGNOSIS — D509 Iron deficiency anemia, unspecified: Secondary | ICD-10-CM | POA: Diagnosis not present

## 2017-01-27 DIAGNOSIS — N186 End stage renal disease: Secondary | ICD-10-CM | POA: Diagnosis not present

## 2017-01-27 DIAGNOSIS — D631 Anemia in chronic kidney disease: Secondary | ICD-10-CM | POA: Diagnosis not present

## 2017-01-29 DIAGNOSIS — N186 End stage renal disease: Secondary | ICD-10-CM | POA: Diagnosis not present

## 2017-01-29 DIAGNOSIS — D509 Iron deficiency anemia, unspecified: Secondary | ICD-10-CM | POA: Diagnosis not present

## 2017-01-29 DIAGNOSIS — Z992 Dependence on renal dialysis: Secondary | ICD-10-CM | POA: Diagnosis not present

## 2017-01-29 DIAGNOSIS — D631 Anemia in chronic kidney disease: Secondary | ICD-10-CM | POA: Diagnosis not present

## 2017-02-01 DIAGNOSIS — D509 Iron deficiency anemia, unspecified: Secondary | ICD-10-CM | POA: Diagnosis not present

## 2017-02-01 DIAGNOSIS — N186 End stage renal disease: Secondary | ICD-10-CM | POA: Diagnosis not present

## 2017-02-01 DIAGNOSIS — D631 Anemia in chronic kidney disease: Secondary | ICD-10-CM | POA: Diagnosis not present

## 2017-02-01 DIAGNOSIS — Z992 Dependence on renal dialysis: Secondary | ICD-10-CM | POA: Diagnosis not present

## 2017-02-03 DIAGNOSIS — D631 Anemia in chronic kidney disease: Secondary | ICD-10-CM | POA: Diagnosis not present

## 2017-02-03 DIAGNOSIS — D509 Iron deficiency anemia, unspecified: Secondary | ICD-10-CM | POA: Diagnosis not present

## 2017-02-03 DIAGNOSIS — N186 End stage renal disease: Secondary | ICD-10-CM | POA: Diagnosis not present

## 2017-02-03 DIAGNOSIS — Z992 Dependence on renal dialysis: Secondary | ICD-10-CM | POA: Diagnosis not present

## 2017-02-05 DIAGNOSIS — Z992 Dependence on renal dialysis: Secondary | ICD-10-CM | POA: Diagnosis not present

## 2017-02-05 DIAGNOSIS — D631 Anemia in chronic kidney disease: Secondary | ICD-10-CM | POA: Diagnosis not present

## 2017-02-05 DIAGNOSIS — N186 End stage renal disease: Secondary | ICD-10-CM | POA: Diagnosis not present

## 2017-02-05 DIAGNOSIS — D509 Iron deficiency anemia, unspecified: Secondary | ICD-10-CM | POA: Diagnosis not present

## 2017-02-08 DIAGNOSIS — D509 Iron deficiency anemia, unspecified: Secondary | ICD-10-CM | POA: Diagnosis not present

## 2017-02-08 DIAGNOSIS — D631 Anemia in chronic kidney disease: Secondary | ICD-10-CM | POA: Diagnosis not present

## 2017-02-08 DIAGNOSIS — N186 End stage renal disease: Secondary | ICD-10-CM | POA: Diagnosis not present

## 2017-02-08 DIAGNOSIS — Z992 Dependence on renal dialysis: Secondary | ICD-10-CM | POA: Diagnosis not present

## 2017-02-10 DIAGNOSIS — Z992 Dependence on renal dialysis: Secondary | ICD-10-CM | POA: Diagnosis not present

## 2017-02-10 DIAGNOSIS — N186 End stage renal disease: Secondary | ICD-10-CM | POA: Diagnosis not present

## 2017-02-10 DIAGNOSIS — D509 Iron deficiency anemia, unspecified: Secondary | ICD-10-CM | POA: Diagnosis not present

## 2017-02-10 DIAGNOSIS — D631 Anemia in chronic kidney disease: Secondary | ICD-10-CM | POA: Diagnosis not present

## 2017-02-12 DIAGNOSIS — D509 Iron deficiency anemia, unspecified: Secondary | ICD-10-CM | POA: Diagnosis not present

## 2017-02-12 DIAGNOSIS — Z992 Dependence on renal dialysis: Secondary | ICD-10-CM | POA: Diagnosis not present

## 2017-02-12 DIAGNOSIS — N186 End stage renal disease: Secondary | ICD-10-CM | POA: Diagnosis not present

## 2017-02-12 DIAGNOSIS — D631 Anemia in chronic kidney disease: Secondary | ICD-10-CM | POA: Diagnosis not present

## 2017-02-15 DIAGNOSIS — D509 Iron deficiency anemia, unspecified: Secondary | ICD-10-CM | POA: Diagnosis not present

## 2017-02-15 DIAGNOSIS — D631 Anemia in chronic kidney disease: Secondary | ICD-10-CM | POA: Diagnosis not present

## 2017-02-15 DIAGNOSIS — Z992 Dependence on renal dialysis: Secondary | ICD-10-CM | POA: Diagnosis not present

## 2017-02-15 DIAGNOSIS — N186 End stage renal disease: Secondary | ICD-10-CM | POA: Diagnosis not present

## 2017-02-17 DIAGNOSIS — D509 Iron deficiency anemia, unspecified: Secondary | ICD-10-CM | POA: Diagnosis not present

## 2017-02-17 DIAGNOSIS — N186 End stage renal disease: Secondary | ICD-10-CM | POA: Diagnosis not present

## 2017-02-17 DIAGNOSIS — D631 Anemia in chronic kidney disease: Secondary | ICD-10-CM | POA: Diagnosis not present

## 2017-02-17 DIAGNOSIS — Z992 Dependence on renal dialysis: Secondary | ICD-10-CM | POA: Diagnosis not present

## 2017-02-19 DIAGNOSIS — Z992 Dependence on renal dialysis: Secondary | ICD-10-CM | POA: Diagnosis not present

## 2017-02-19 DIAGNOSIS — N186 End stage renal disease: Secondary | ICD-10-CM | POA: Diagnosis not present

## 2017-02-19 DIAGNOSIS — N2581 Secondary hyperparathyroidism of renal origin: Secondary | ICD-10-CM | POA: Diagnosis not present

## 2017-02-19 DIAGNOSIS — D509 Iron deficiency anemia, unspecified: Secondary | ICD-10-CM | POA: Diagnosis not present

## 2017-02-19 DIAGNOSIS — D631 Anemia in chronic kidney disease: Secondary | ICD-10-CM | POA: Diagnosis not present

## 2017-02-22 DIAGNOSIS — N186 End stage renal disease: Secondary | ICD-10-CM | POA: Diagnosis not present

## 2017-02-22 DIAGNOSIS — Z992 Dependence on renal dialysis: Secondary | ICD-10-CM | POA: Diagnosis not present

## 2017-02-22 DIAGNOSIS — D631 Anemia in chronic kidney disease: Secondary | ICD-10-CM | POA: Diagnosis not present

## 2017-02-22 DIAGNOSIS — N2581 Secondary hyperparathyroidism of renal origin: Secondary | ICD-10-CM | POA: Diagnosis not present

## 2017-02-22 DIAGNOSIS — D509 Iron deficiency anemia, unspecified: Secondary | ICD-10-CM | POA: Diagnosis not present

## 2017-02-24 DIAGNOSIS — D509 Iron deficiency anemia, unspecified: Secondary | ICD-10-CM | POA: Diagnosis not present

## 2017-02-24 DIAGNOSIS — Z992 Dependence on renal dialysis: Secondary | ICD-10-CM | POA: Diagnosis not present

## 2017-02-24 DIAGNOSIS — N186 End stage renal disease: Secondary | ICD-10-CM | POA: Diagnosis not present

## 2017-02-24 DIAGNOSIS — D631 Anemia in chronic kidney disease: Secondary | ICD-10-CM | POA: Diagnosis not present

## 2017-02-24 DIAGNOSIS — N2581 Secondary hyperparathyroidism of renal origin: Secondary | ICD-10-CM | POA: Diagnosis not present

## 2017-02-26 DIAGNOSIS — Z992 Dependence on renal dialysis: Secondary | ICD-10-CM | POA: Diagnosis not present

## 2017-02-26 DIAGNOSIS — E113512 Type 2 diabetes mellitus with proliferative diabetic retinopathy with macular edema, left eye: Secondary | ICD-10-CM | POA: Diagnosis not present

## 2017-02-26 DIAGNOSIS — N186 End stage renal disease: Secondary | ICD-10-CM | POA: Diagnosis not present

## 2017-02-26 DIAGNOSIS — N2581 Secondary hyperparathyroidism of renal origin: Secondary | ICD-10-CM | POA: Diagnosis not present

## 2017-02-26 DIAGNOSIS — D509 Iron deficiency anemia, unspecified: Secondary | ICD-10-CM | POA: Diagnosis not present

## 2017-02-26 DIAGNOSIS — D631 Anemia in chronic kidney disease: Secondary | ICD-10-CM | POA: Diagnosis not present

## 2017-03-01 DIAGNOSIS — N2581 Secondary hyperparathyroidism of renal origin: Secondary | ICD-10-CM | POA: Diagnosis not present

## 2017-03-01 DIAGNOSIS — N186 End stage renal disease: Secondary | ICD-10-CM | POA: Diagnosis not present

## 2017-03-01 DIAGNOSIS — D509 Iron deficiency anemia, unspecified: Secondary | ICD-10-CM | POA: Diagnosis not present

## 2017-03-01 DIAGNOSIS — D631 Anemia in chronic kidney disease: Secondary | ICD-10-CM | POA: Diagnosis not present

## 2017-03-01 DIAGNOSIS — Z992 Dependence on renal dialysis: Secondary | ICD-10-CM | POA: Diagnosis not present

## 2017-03-05 DIAGNOSIS — D631 Anemia in chronic kidney disease: Secondary | ICD-10-CM | POA: Diagnosis not present

## 2017-03-05 DIAGNOSIS — D509 Iron deficiency anemia, unspecified: Secondary | ICD-10-CM | POA: Diagnosis not present

## 2017-03-05 DIAGNOSIS — Z992 Dependence on renal dialysis: Secondary | ICD-10-CM | POA: Diagnosis not present

## 2017-03-05 DIAGNOSIS — E1140 Type 2 diabetes mellitus with diabetic neuropathy, unspecified: Secondary | ICD-10-CM | POA: Diagnosis not present

## 2017-03-05 DIAGNOSIS — B351 Tinea unguium: Secondary | ICD-10-CM | POA: Diagnosis not present

## 2017-03-05 DIAGNOSIS — N2581 Secondary hyperparathyroidism of renal origin: Secondary | ICD-10-CM | POA: Diagnosis not present

## 2017-03-05 DIAGNOSIS — N186 End stage renal disease: Secondary | ICD-10-CM | POA: Diagnosis not present

## 2017-03-05 DIAGNOSIS — L97521 Non-pressure chronic ulcer of other part of left foot limited to breakdown of skin: Secondary | ICD-10-CM | POA: Diagnosis not present

## 2017-03-05 DIAGNOSIS — Z794 Long term (current) use of insulin: Secondary | ICD-10-CM | POA: Diagnosis not present

## 2017-03-08 DIAGNOSIS — D509 Iron deficiency anemia, unspecified: Secondary | ICD-10-CM | POA: Diagnosis not present

## 2017-03-08 DIAGNOSIS — Z992 Dependence on renal dialysis: Secondary | ICD-10-CM | POA: Diagnosis not present

## 2017-03-08 DIAGNOSIS — N186 End stage renal disease: Secondary | ICD-10-CM | POA: Diagnosis not present

## 2017-03-08 DIAGNOSIS — N2581 Secondary hyperparathyroidism of renal origin: Secondary | ICD-10-CM | POA: Diagnosis not present

## 2017-03-08 DIAGNOSIS — D631 Anemia in chronic kidney disease: Secondary | ICD-10-CM | POA: Diagnosis not present

## 2017-03-10 DIAGNOSIS — Z992 Dependence on renal dialysis: Secondary | ICD-10-CM | POA: Diagnosis not present

## 2017-03-10 DIAGNOSIS — D631 Anemia in chronic kidney disease: Secondary | ICD-10-CM | POA: Diagnosis not present

## 2017-03-10 DIAGNOSIS — D509 Iron deficiency anemia, unspecified: Secondary | ICD-10-CM | POA: Diagnosis not present

## 2017-03-10 DIAGNOSIS — N2581 Secondary hyperparathyroidism of renal origin: Secondary | ICD-10-CM | POA: Diagnosis not present

## 2017-03-10 DIAGNOSIS — N186 End stage renal disease: Secondary | ICD-10-CM | POA: Diagnosis not present

## 2017-03-12 DIAGNOSIS — N2581 Secondary hyperparathyroidism of renal origin: Secondary | ICD-10-CM | POA: Diagnosis not present

## 2017-03-12 DIAGNOSIS — N186 End stage renal disease: Secondary | ICD-10-CM | POA: Diagnosis not present

## 2017-03-12 DIAGNOSIS — Z992 Dependence on renal dialysis: Secondary | ICD-10-CM | POA: Diagnosis not present

## 2017-03-12 DIAGNOSIS — D631 Anemia in chronic kidney disease: Secondary | ICD-10-CM | POA: Diagnosis not present

## 2017-03-12 DIAGNOSIS — D509 Iron deficiency anemia, unspecified: Secondary | ICD-10-CM | POA: Diagnosis not present

## 2017-03-15 DIAGNOSIS — N2581 Secondary hyperparathyroidism of renal origin: Secondary | ICD-10-CM | POA: Diagnosis not present

## 2017-03-15 DIAGNOSIS — D509 Iron deficiency anemia, unspecified: Secondary | ICD-10-CM | POA: Diagnosis not present

## 2017-03-15 DIAGNOSIS — Z992 Dependence on renal dialysis: Secondary | ICD-10-CM | POA: Diagnosis not present

## 2017-03-15 DIAGNOSIS — D631 Anemia in chronic kidney disease: Secondary | ICD-10-CM | POA: Diagnosis not present

## 2017-03-15 DIAGNOSIS — N186 End stage renal disease: Secondary | ICD-10-CM | POA: Diagnosis not present

## 2017-03-17 DIAGNOSIS — D509 Iron deficiency anemia, unspecified: Secondary | ICD-10-CM | POA: Diagnosis not present

## 2017-03-17 DIAGNOSIS — N186 End stage renal disease: Secondary | ICD-10-CM | POA: Diagnosis not present

## 2017-03-17 DIAGNOSIS — Z992 Dependence on renal dialysis: Secondary | ICD-10-CM | POA: Diagnosis not present

## 2017-03-17 DIAGNOSIS — D631 Anemia in chronic kidney disease: Secondary | ICD-10-CM | POA: Diagnosis not present

## 2017-03-17 DIAGNOSIS — N2581 Secondary hyperparathyroidism of renal origin: Secondary | ICD-10-CM | POA: Diagnosis not present

## 2017-03-19 DIAGNOSIS — D631 Anemia in chronic kidney disease: Secondary | ICD-10-CM | POA: Diagnosis not present

## 2017-03-19 DIAGNOSIS — Z992 Dependence on renal dialysis: Secondary | ICD-10-CM | POA: Diagnosis not present

## 2017-03-19 DIAGNOSIS — N2581 Secondary hyperparathyroidism of renal origin: Secondary | ICD-10-CM | POA: Diagnosis not present

## 2017-03-19 DIAGNOSIS — N186 End stage renal disease: Secondary | ICD-10-CM | POA: Diagnosis not present

## 2017-03-19 DIAGNOSIS — D509 Iron deficiency anemia, unspecified: Secondary | ICD-10-CM | POA: Diagnosis not present

## 2017-03-20 DIAGNOSIS — Z992 Dependence on renal dialysis: Secondary | ICD-10-CM | POA: Diagnosis not present

## 2017-03-20 DIAGNOSIS — N186 End stage renal disease: Secondary | ICD-10-CM | POA: Diagnosis not present

## 2017-03-22 DIAGNOSIS — D631 Anemia in chronic kidney disease: Secondary | ICD-10-CM | POA: Diagnosis not present

## 2017-03-22 DIAGNOSIS — D509 Iron deficiency anemia, unspecified: Secondary | ICD-10-CM | POA: Diagnosis not present

## 2017-03-22 DIAGNOSIS — N186 End stage renal disease: Secondary | ICD-10-CM | POA: Diagnosis not present

## 2017-03-22 DIAGNOSIS — Z992 Dependence on renal dialysis: Secondary | ICD-10-CM | POA: Diagnosis not present

## 2017-03-26 DIAGNOSIS — D509 Iron deficiency anemia, unspecified: Secondary | ICD-10-CM | POA: Diagnosis not present

## 2017-03-26 DIAGNOSIS — Z992 Dependence on renal dialysis: Secondary | ICD-10-CM | POA: Diagnosis not present

## 2017-03-26 DIAGNOSIS — N186 End stage renal disease: Secondary | ICD-10-CM | POA: Diagnosis not present

## 2017-03-26 DIAGNOSIS — D631 Anemia in chronic kidney disease: Secondary | ICD-10-CM | POA: Diagnosis not present

## 2017-03-29 DIAGNOSIS — D509 Iron deficiency anemia, unspecified: Secondary | ICD-10-CM | POA: Diagnosis not present

## 2017-03-29 DIAGNOSIS — N186 End stage renal disease: Secondary | ICD-10-CM | POA: Diagnosis not present

## 2017-03-29 DIAGNOSIS — D631 Anemia in chronic kidney disease: Secondary | ICD-10-CM | POA: Diagnosis not present

## 2017-03-29 DIAGNOSIS — Z992 Dependence on renal dialysis: Secondary | ICD-10-CM | POA: Diagnosis not present

## 2017-03-30 DIAGNOSIS — L97521 Non-pressure chronic ulcer of other part of left foot limited to breakdown of skin: Secondary | ICD-10-CM | POA: Diagnosis not present

## 2017-03-31 DIAGNOSIS — D631 Anemia in chronic kidney disease: Secondary | ICD-10-CM | POA: Diagnosis not present

## 2017-03-31 DIAGNOSIS — D509 Iron deficiency anemia, unspecified: Secondary | ICD-10-CM | POA: Diagnosis not present

## 2017-03-31 DIAGNOSIS — Z992 Dependence on renal dialysis: Secondary | ICD-10-CM | POA: Diagnosis not present

## 2017-03-31 DIAGNOSIS — N186 End stage renal disease: Secondary | ICD-10-CM | POA: Diagnosis not present

## 2017-04-02 DIAGNOSIS — D509 Iron deficiency anemia, unspecified: Secondary | ICD-10-CM | POA: Diagnosis not present

## 2017-04-02 DIAGNOSIS — D631 Anemia in chronic kidney disease: Secondary | ICD-10-CM | POA: Diagnosis not present

## 2017-04-02 DIAGNOSIS — Z992 Dependence on renal dialysis: Secondary | ICD-10-CM | POA: Diagnosis not present

## 2017-04-02 DIAGNOSIS — N186 End stage renal disease: Secondary | ICD-10-CM | POA: Diagnosis not present

## 2017-04-05 DIAGNOSIS — D509 Iron deficiency anemia, unspecified: Secondary | ICD-10-CM | POA: Diagnosis not present

## 2017-04-05 DIAGNOSIS — N186 End stage renal disease: Secondary | ICD-10-CM | POA: Diagnosis not present

## 2017-04-05 DIAGNOSIS — D631 Anemia in chronic kidney disease: Secondary | ICD-10-CM | POA: Diagnosis not present

## 2017-04-05 DIAGNOSIS — Z992 Dependence on renal dialysis: Secondary | ICD-10-CM | POA: Diagnosis not present

## 2017-04-09 ENCOUNTER — Encounter: Payer: Self-pay | Admitting: Emergency Medicine

## 2017-04-09 ENCOUNTER — Emergency Department
Admission: EM | Admit: 2017-04-09 | Discharge: 2017-04-09 | Disposition: A | Discharge status: Home / Self Care | Payer: Medicare Other | Attending: Emergency Medicine | Admitting: Emergency Medicine

## 2017-04-09 ENCOUNTER — Emergency Department: Payer: Medicare Other

## 2017-04-09 DIAGNOSIS — I132 Hypertensive heart and chronic kidney disease with heart failure and with stage 5 chronic kidney disease, or end stage renal disease: Secondary | ICD-10-CM | POA: Diagnosis not present

## 2017-04-09 DIAGNOSIS — Z992 Dependence on renal dialysis: Secondary | ICD-10-CM | POA: Diagnosis not present

## 2017-04-09 DIAGNOSIS — I509 Heart failure, unspecified: Secondary | ICD-10-CM | POA: Insufficient documentation

## 2017-04-09 DIAGNOSIS — Z794 Long term (current) use of insulin: Secondary | ICD-10-CM | POA: Insufficient documentation

## 2017-04-09 DIAGNOSIS — Z79899 Other long term (current) drug therapy: Secondary | ICD-10-CM | POA: Diagnosis not present

## 2017-04-09 DIAGNOSIS — R51 Headache: Secondary | ICD-10-CM | POA: Insufficient documentation

## 2017-04-09 DIAGNOSIS — R519 Headache, unspecified: Secondary | ICD-10-CM

## 2017-04-09 DIAGNOSIS — F17210 Nicotine dependence, cigarettes, uncomplicated: Secondary | ICD-10-CM | POA: Insufficient documentation

## 2017-04-09 DIAGNOSIS — E119 Type 2 diabetes mellitus without complications: Secondary | ICD-10-CM | POA: Diagnosis not present

## 2017-04-09 DIAGNOSIS — N186 End stage renal disease: Secondary | ICD-10-CM | POA: Insufficient documentation

## 2017-04-09 DIAGNOSIS — J449 Chronic obstructive pulmonary disease, unspecified: Secondary | ICD-10-CM | POA: Insufficient documentation

## 2017-04-09 DIAGNOSIS — Z9115 Patient's noncompliance with renal dialysis: Secondary | ICD-10-CM | POA: Diagnosis not present

## 2017-04-09 DIAGNOSIS — D509 Iron deficiency anemia, unspecified: Secondary | ICD-10-CM | POA: Diagnosis not present

## 2017-04-09 DIAGNOSIS — D631 Anemia in chronic kidney disease: Secondary | ICD-10-CM | POA: Diagnosis not present

## 2017-04-09 LAB — CBC WITH DIFFERENTIAL/PLATELET
BASOS ABS: 0 10*3/uL (ref 0–0.1)
Basophils Relative: 1 %
Eosinophils Absolute: 0.1 10*3/uL (ref 0–0.7)
Eosinophils Relative: 2 %
HEMATOCRIT: 32.6 % — AB (ref 35.0–47.0)
Hemoglobin: 11.4 g/dL — ABNORMAL LOW (ref 12.0–16.0)
LYMPHS PCT: 16 %
Lymphs Abs: 1 10*3/uL (ref 1.0–3.6)
MCH: 32.2 pg (ref 26.0–34.0)
MCHC: 35.1 g/dL (ref 32.0–36.0)
MCV: 91.9 fL (ref 80.0–100.0)
Monocytes Absolute: 0.4 10*3/uL (ref 0.2–0.9)
Monocytes Relative: 7 %
Neutro Abs: 4.9 10*3/uL (ref 1.4–6.5)
Neutrophils Relative %: 74 %
Platelets: 150 10*3/uL (ref 150–440)
RBC: 3.55 MIL/uL — AB (ref 3.80–5.20)
RDW: 13.9 % (ref 11.5–14.5)
WBC: 6.5 10*3/uL (ref 3.6–11.0)

## 2017-04-09 LAB — COMPREHENSIVE METABOLIC PANEL
ALT: 39 U/L (ref 14–54)
AST: 34 U/L (ref 15–41)
Albumin: 3.8 g/dL (ref 3.5–5.0)
Alkaline Phosphatase: 59 U/L (ref 38–126)
Anion gap: 14 (ref 5–15)
BILIRUBIN TOTAL: 0.8 mg/dL (ref 0.3–1.2)
BUN: 40 mg/dL — AB (ref 6–20)
CO2: 24 mmol/L (ref 22–32)
Calcium: 7.7 mg/dL — ABNORMAL LOW (ref 8.9–10.3)
Chloride: 94 mmol/L — ABNORMAL LOW (ref 101–111)
Creatinine, Ser: 4.43 mg/dL — ABNORMAL HIGH (ref 0.44–1.00)
GFR calc Af Amer: 12 mL/min — ABNORMAL LOW (ref >60)
GFR, EST NON AFRICAN AMERICAN: 11 mL/min — AB (ref >60)
Glucose, Bld: 201 mg/dL — ABNORMAL HIGH (ref 65–99)
Potassium: 3.9 mmol/L (ref 3.5–5.1)
Sodium: 132 mmol/L — ABNORMAL LOW (ref 135–145)
TOTAL PROTEIN: 9 g/dL — AB (ref 6.5–8.1)

## 2017-04-09 MED ORDER — MORPHINE SULFATE (PF) 4 MG/ML IV SOLN
4.0000 mg | Freq: Once | INTRAVENOUS | Status: AC
  Administered 2017-04-09: 4 mg via INTRAVENOUS
  Filled 2017-04-09: qty 1

## 2017-04-09 MED ORDER — OXYCODONE HCL 5 MG PO TABS
10.0000 mg | ORAL_TABLET | Freq: Once | ORAL | Status: AC
  Administered 2017-04-09: 10 mg via ORAL
  Filled 2017-04-09: qty 2

## 2017-04-09 NOTE — ED Provider Notes (Signed)
[facility] Emergency Department Provider Note  ____________________________________________   I have reviewed the triage vital signs and the nursing notes.   HISTORY  Chief Complaint Headache    HPI [name] is a 51 y.o. female who presents today complaining of headache. Patient has had headaches for 1 year. She has them on a very regular basis this is not an unusual headache for her, headache usually happens during dialysis. His been worse since January. She had an MRI of her spine at that time because of its own but maybe this was a pinched neck. Of note, patient was on chronic oxycodone until January when her pain doctor took her off oxycodone. Since that time she's been on other medications including fentanyl but none of them seem to control her pain and she states that this is also making her headache worse. She states she took the fat and "threw it in the trash" today because it was doing "nothing" for her headache. Patient states that her headaches are always worse during dialysis. She states her dialysis doctors are aware of this. She denies depression or SI or HI. She states that she had to have abbreviated dialysis twice and she didn't bother to go on Wednesday because of her headaches which seem to occur pensively around the time of dialysis. She does not usually get them at any other time. They are not thunderclap, they're not associated with any neurologic deficit. Sometimes she has photophobia and light sensitivity. She denies prior history of migraines before last year. She is not seen a neurologist for this. She states no it can help her pain. She states that she has significant pain this time which began gradually today. This is exact same headache she always has. She is requesting IV pain medication. She thinks morphine might help. No family history of aneurysm and no fever no stiff neck.   Past Medical History:  Diagnosis Date  . Anemia   .  Anxiety   . Arthritis   . CHF (congestive heart failure) (HCC)    history of, has been cleared.  . Chronic kidney disease   . COPD (chronic obstructive pulmonary disease) ([place])   . Depression   . Diabetes mellitus without complication ([place])   . Emphysema of lung ([place])   . End stage renal disease ([place])   . Headache    migraines  . Hepatitis 2003   Hep C  . Hypercholesterolemia    pt denies  . Hypertension   . Peripheral vascular disease ([place])   . Pneumonia 2015  . Tobacco dependence     Patient Active Problem List   Diagnosis Date Noted  . Hyperkalemia 01/08/2017  . Generalized abdominal pain 12/31/2016  . Diabetes ([place]) 12/18/2016  . SMA stenosis ([place]) 12/18/2016  . Atypical chest pain 10/20/2016  . Kidney transplant candidate 08/11/2016  . MRSA (methicillin resistant Staphylococcus aureus) infection 07/18/2016  . Status post incision and drainage 07/18/2016  . Chronic ulcer of left foot ([place]) 07/18/2016  . Acquired palmar and plantar hyperkeratosis 07/18/2016  . Elevated troponin 07/18/2016  . Essential hypertension 07/18/2016  . Epistaxis 07/18/2016  . Tobacco abuse 07/18/2016  . ESRD on dialysis ([place]) 07/18/2016  . Orbital cellulitis on right 07/13/2016  . Cellulitis 06/06/2016  . Adjustment disorder with mixed disturbance of emotions and conduct 05/11/2016  . Dysthymia 05/11/2016  . Diabetic macular edema ([place]) 08/24/2013  . Hypertensive retinopathy of both eyes 08/24/2013  . Non-proliferative diabetic retinopathy, severe, both eyes (  [place]) 08/24/2013  . Psoriatic arthropathy ([place]) 11/14/2012    Past Surgical History:  Procedure Laterality Date  . AV FISTULA PLACEMENT Left 11/2014  . CHOLECYSTECTOMY    . cyst removed  from left hand Left 1989  . INCISION AND DRAINAGE ABSCESS N/A 07/16/2016   Procedure: INCISION AND DRAINAGE ABSCESS;  Surgeon: Carloyn Manner, MD;  Location: [facility] ORS;  Service: ENT;  Laterality: N/A;  . PERIPHERAL VASCULAR CATHETERIZATION N/A  07/25/2015   Procedure: A/V Shuntogram/Fistulagram;  Surgeon: Algernon Huxley, MD;  Location: [place] [facility];  Service: Cardiovascular;  Laterality: N/A;  . PERIPHERAL VASCULAR CATHETERIZATION Left 07/25/2015   Procedure: A/V Shunt Intervention;  Surgeon: Algernon Huxley, MD;  Location: [place] [facility];  Service: Cardiovascular;  Laterality: Left;  . PERIPHERAL VASCULAR CATHETERIZATION Left 10/07/2015   Procedure: A/V Shuntogram/Fistulagram;  Surgeon: Algernon Huxley, MD;  Location: [place] [facility];  Service: Cardiovascular;  Laterality: Left;  . PERIPHERAL VASCULAR CATHETERIZATION N/A 10/07/2015   Procedure: A/V Shunt Intervention;  Surgeon: Algernon Huxley, MD;  Location: [place] [facility];  Service: Cardiovascular;  Laterality: N/A;  . PERIPHERAL VASCULAR CATHETERIZATION  10/07/2015   Procedure: Dialysis/Perma Catheter Insertion;  Surgeon: Algernon Huxley, MD;  Location: [place] [facility];  Service: Cardiovascular;;  . PERIPHERAL VASCULAR CATHETERIZATION N/A 12/17/2015   Procedure: Dialysis/Perma Catheter Removal;  Surgeon: Katha Cabal, MD;  Location: [place] [facility];  Service: Cardiovascular;  Laterality: N/A;  . PERIPHERAL VASCULAR CATHETERIZATION N/A 01/11/2017   Procedure: Visceral Angiography;  Surgeon: Algernon Huxley, MD;  Location: [place] [facility];  Service: Cardiovascular;  Laterality: N/A;  . PERIPHERAL VASCULAR CATHETERIZATION N/A 01/11/2017   Procedure: Visceral Artery Intervention;  Surgeon: Algernon Huxley, MD;  Location: [place] [facility];  Service: Cardiovascular;  Laterality: N/A;  . rt. tubal and ovary removed    . TONSILLECTOMY      Prior to Admission medications   Medication Sig Start Date End Date Taking? Authorizing Provider  b complex-vitamin c-folic acid (NEPHRO-VITE) 0.8 MG TABS tablet Take 1 tablet by mouth daily. On dialysis days takes in evening and on non-dialysis days morning    Historical Provider, MD  calcium acetate (PHOSLO) 667 MG capsule Take 2,001  mg by mouth 3 (three) times daily with meals.  11/26/16   Historical Provider, MD  clobetasol ointment (TEMOVATE) 2.84 % Apply 1 application topically daily as needed (for psorasis).     Historical Provider, MD  dicyclomine (BENTYL) 20 MG tablet Take 1 tablet (20 mg total) by mouth 4 (four) times daily -  before meals and at bedtime. 01/12/17   Loletha Grayer, MD  diltiazem (CARDIZEM SR) 120 MG 12 hr capsule Take 120 mg by mouth 2 (two) times daily.  12/03/16   Historical Provider, MD  hydrALAZINE (APRESOLINE) 100 MG tablet Take 100 mg by mouth 2 (two) times daily.    Historical Provider, MD  insulin regular (NOVOLIN R,HUMULIN R) 100 units/mL injection Inject 5-10 Units into the skin 3 (three) times daily before meals. Pt uses per sliding scale.    Historical Provider, MD  loperamide (IMODIUM A-D) 2 MG tablet Take 1 tablet (2 mg total) by mouth every other day as needed for diarrhea or loose stools. 01/12/17   Loletha Grayer, MD  oxyCODONE (OXYCONTIN) 10 mg 12 hr tablet Take 10 mg by mouth every 12 (twelve) hours.    Historical Provider, MD  oxyCODONE (ROXICODONE) 5 MG immediate release tablet Take 1 tablet (  5 mg total) by mouth every 8 (eight) hours as needed. 01/12/17 01/12/18  Loletha Grayer, MD  promethazine (PHENERGAN) 25 MG tablet Take 1 tablet (25 mg total) by mouth every 4 (four) hours as needed for nausea or vomiting. 10/19/16   [name], MD  ranitidine (ZANTAC) 150 MG tablet Take 1 tablet (150 mg total) by mouth 2 (two) times daily. 12/15/16 01/15/17  Jonathon Bellows, MD  sevelamer carbonate (RENVELA) 800 MG tablet Take 1,600 mg by mouth See admin instructions. 2 tablets with meals and snacks.    Historical Provider, MD    Allergies Cinnamon; Cinoxate; Tylenol [acetaminophen]; Ciprofloxacin; Garlic; Onion; and Prednisone  Family History  Problem Relation Age of Onset  . Hypertension Mother   . Heart disease Mother   . Hypertension Father     Social History Social History   Substance Use Topics  . Smoking status: Current Every Day Smoker    Packs/day: 0.25    Years: 17.00    Types: Cigarettes  . Smokeless tobacco: Never Used  . Alcohol use No    Review of Systems Constitutional: No fever/chills Eyes: No visual changes. ENT: No sore throat. No stiff neck no neck pain Cardiovascular: Denies chest pain. Respiratory: Denies shortness of breath. Gastrointestinal:  states vomited once has not vomited here.  No diarrhea.  No constipation. Genitourinary: Negative for dysuria. Musculoskeletal: Negative lower extremity swelling Skin: Negative for rash. Neurological: See history of present illness. Regarding headaches, focal weakness or numbness. 10-point ROS otherwise negative.  ____________________________________________   PHYSICAL EXAM:  VITAL SIGNS: ED Triage Vitals  Enc Vitals Group     BP 04/09/17 1445 (!) 176/81     Pulse Rate 04/09/17 1445 85     Resp 04/09/17 1445 16     Temp 04/09/17 1445 98.2 F (36.8 C)     Temp Source 04/09/17 1445 Oral     SpO2 04/09/17 1443 98 %     Weight --      Height --      Head Circumference --      Peak Flow --      Pain Score 04/09/17 1445 8     Pain Loc --      Pain Edu? --      Excl. in [place]? --     Constitutional: Alert and oriented. Well appearing and in no acute distress.Medically speaking she is nontoxic she is however tearful and upset Eyes: Conjunctivae are normal. PERRL. EOMI. Head: Atraumatic. Nose: No congestion/rhinnorhea. Mouth/Throat: Mucous membranes are moist.  Oropharynx non-erythematous. Neck: No stridor.   Nontender with no meningismus Cardiovascular: Normal rate, regular rhythm. Grossly normal heart sounds.  Good peripheral circulation. Respiratory: Normal respiratory effort.  No retractions. Lungs CTAB. Abdominal: Soft and nontender. No distention. No guarding no rebound Back:  There is no focal tenderness or step off.  there is no midline tenderness there are no lesions noted.  there is no CVA tenderness Musculoskeletal: No lower extremity tenderness, no upper extremity tenderness. No joint effusions, no DVT signs strong distal pulses no edema Neurologic:  Cranial nerves II through XII are grossly intact 5 out of 5 strength bilateral upper and lower extremity. Finger to nose within normal limits heel to shin within normal limits, speech is normal with no word finding difficulty or dysarthria, reflexes symmetric, pupils are equally round and reactive to light, there is no pronator drift, sensation is normal, vision is intact to confrontation, gait is deferred, there is no nystagmus, normal neurologic exam  Skin:  Skin is warm, dry and intact. No rash noted. Psychiatric: Mood and affect are anxious Speech and behavior are normal.  ____________________________________________   LABS (all labs ordered are listed, but only abnormal results are displayed)  Labs Reviewed  CBC WITH DIFFERENTIAL/PLATELET - Abnormal; Notable for the following:       Result Value   RBC 3.55 (*)    Hemoglobin 11.4 (*)    HCT 32.6 (*)    All other components within normal limits  COMPREHENSIVE METABOLIC PANEL - Abnormal; Notable for the following:    Sodium 132 (*)    Chloride 94 (*)    Glucose, Bld 201 (*)    BUN 40 (*)    Creatinine, Ser 4.43 (*)    Calcium 7.7 (*)    Total Protein 9.0 (*)    GFR calc non Af Amer 11 (*)    GFR calc Af Amer 12 (*)    All other components within normal limits   ____________________________________________  EKG  I personally interpreted any EKGs ordered by me or triage EKG obtained to evaluate for possible hyperkalemia shows sinus rhythm and borderline long QT normal axis, no acute ST elevation or depression no peaked T waves or acute ischemic changes ____________________________________________  RADIOLOGY  I reviewed any imaging ordered by me or triage that were performed during my shift and, if possible, patient and/or family made aware of any  abnormal findings. ____________________________________________   PROCEDURES  Procedure(s) performed: None  Procedures  Critical Care performed: None  ____________________________________________   INITIAL IMPRESSION / ASSESSMENT AND PLAN / ED COURSE  Pertinent labs & imaging results that were available during my care of the patient were reviewed by me and considered in my medical decision making (see chart for details).  Patient here with chronic recurrent headaches that seem to be related to her narcotic pain medication dosing. Even though she states she did not have full dialysis today, her potassium is 3.9 which is reassuring. She is neurologically intact. Nothing to suggest  bleed or thrombosis or aneurysmal event. This is not the worst headache of life, chronic recurrent headaches for this patient. I did do a CT scan as I don't see any recent imaging of her brain which is reassuringly normal. I don't think an LP is warranted. I don't think she is having bleed and I don't think she has meningitis. We did give her morphine, pain is very much more managed and she is much more comfortable. There is no indication for emergent dialysis at this time. I did discuss with Dr. [name], her nephrologist, who feels that the patient is safe for discharge and will follow closely as an outpatient. We will refer her to neurology. We will reassess her pain prior to discharge. Patient does have chronic pain issues which makes it somewhat more difficult to interpret her disruptions of her pain and maybe increases her pain sensitivity. Also, patient is very interested in obtaining narcotic pain medication for this chronic recurrent headache which is problematic given that she has a pain specialist. She remains neurologically intact at this time. Return precautions and follow-up given and understood    ____________________________________________   FINAL CLINICAL IMPRESSION(S) / ED DIAGNOSES  Final  diagnoses:  None      This chart was dictated using voice recognition software.  Despite best efforts to proofread,  errors can occur which can change meaning.      Schuyler Amor, MD 04/09/17 [phone]

## 2017-04-09 NOTE — ED Notes (Signed)
AAOx3.  Skin warm and dry.  NAD.  MAE equally and strong. Ambualtes with easy and steady gait.

## 2017-04-09 NOTE — ED Triage Notes (Signed)
Pt brought in by [facility] from dialysis. Pt c/o headache x 1 year on and off, worse over the past 2 weeks. Pt has been unable to complete any dialysis treatments this week due to headache, states that last full dialysis treatment was "maybe Friday". Pt states that headaches have been an ongoing issues, she has discussed this with her PCP and kidney MD. Pt vomited x 1 today and is also sensitive to light.

## 2017-04-09 NOTE — ED Notes (Signed)
To ct scan

## 2017-04-12 DIAGNOSIS — Z992 Dependence on renal dialysis: Secondary | ICD-10-CM | POA: Diagnosis not present

## 2017-04-12 DIAGNOSIS — D631 Anemia in chronic kidney disease: Secondary | ICD-10-CM | POA: Diagnosis not present

## 2017-04-12 DIAGNOSIS — D509 Iron deficiency anemia, unspecified: Secondary | ICD-10-CM | POA: Diagnosis not present

## 2017-04-12 DIAGNOSIS — N186 End stage renal disease: Secondary | ICD-10-CM | POA: Diagnosis not present

## 2017-04-14 DIAGNOSIS — D631 Anemia in chronic kidney disease: Secondary | ICD-10-CM | POA: Diagnosis not present

## 2017-04-14 DIAGNOSIS — N186 End stage renal disease: Secondary | ICD-10-CM | POA: Diagnosis not present

## 2017-04-14 DIAGNOSIS — Z992 Dependence on renal dialysis: Secondary | ICD-10-CM | POA: Diagnosis not present

## 2017-04-14 DIAGNOSIS — D509 Iron deficiency anemia, unspecified: Secondary | ICD-10-CM | POA: Diagnosis not present

## 2017-04-16 DIAGNOSIS — D509 Iron deficiency anemia, unspecified: Secondary | ICD-10-CM | POA: Diagnosis not present

## 2017-04-16 DIAGNOSIS — Z992 Dependence on renal dialysis: Secondary | ICD-10-CM | POA: Diagnosis not present

## 2017-04-16 DIAGNOSIS — D631 Anemia in chronic kidney disease: Secondary | ICD-10-CM | POA: Diagnosis not present

## 2017-04-16 DIAGNOSIS — N186 End stage renal disease: Secondary | ICD-10-CM | POA: Diagnosis not present

## 2017-04-19 DIAGNOSIS — Z992 Dependence on renal dialysis: Secondary | ICD-10-CM | POA: Diagnosis not present

## 2017-04-19 DIAGNOSIS — D509 Iron deficiency anemia, unspecified: Secondary | ICD-10-CM | POA: Diagnosis not present

## 2017-04-19 DIAGNOSIS — N186 End stage renal disease: Secondary | ICD-10-CM | POA: Diagnosis not present

## 2017-04-19 DIAGNOSIS — D631 Anemia in chronic kidney disease: Secondary | ICD-10-CM | POA: Diagnosis not present

## 2017-04-21 DIAGNOSIS — N186 End stage renal disease: Secondary | ICD-10-CM | POA: Diagnosis not present

## 2017-04-21 DIAGNOSIS — D509 Iron deficiency anemia, unspecified: Secondary | ICD-10-CM | POA: Diagnosis not present

## 2017-04-21 DIAGNOSIS — D631 Anemia in chronic kidney disease: Secondary | ICD-10-CM | POA: Diagnosis not present

## 2017-04-21 DIAGNOSIS — Z992 Dependence on renal dialysis: Secondary | ICD-10-CM | POA: Diagnosis not present

## 2017-04-26 DIAGNOSIS — Z992 Dependence on renal dialysis: Secondary | ICD-10-CM | POA: Diagnosis not present

## 2017-04-26 DIAGNOSIS — D631 Anemia in chronic kidney disease: Secondary | ICD-10-CM | POA: Diagnosis not present

## 2017-04-26 DIAGNOSIS — N186 End stage renal disease: Secondary | ICD-10-CM | POA: Diagnosis not present

## 2017-04-26 DIAGNOSIS — D509 Iron deficiency anemia, unspecified: Secondary | ICD-10-CM | POA: Diagnosis not present

## 2017-04-28 DIAGNOSIS — Z992 Dependence on renal dialysis: Secondary | ICD-10-CM | POA: Diagnosis not present

## 2017-04-28 DIAGNOSIS — N186 End stage renal disease: Secondary | ICD-10-CM | POA: Diagnosis not present

## 2017-04-28 DIAGNOSIS — D509 Iron deficiency anemia, unspecified: Secondary | ICD-10-CM | POA: Diagnosis not present

## 2017-04-28 DIAGNOSIS — D631 Anemia in chronic kidney disease: Secondary | ICD-10-CM | POA: Diagnosis not present

## 2017-04-30 DIAGNOSIS — N186 End stage renal disease: Secondary | ICD-10-CM | POA: Diagnosis not present

## 2017-04-30 DIAGNOSIS — D509 Iron deficiency anemia, unspecified: Secondary | ICD-10-CM | POA: Diagnosis not present

## 2017-04-30 DIAGNOSIS — D631 Anemia in chronic kidney disease: Secondary | ICD-10-CM | POA: Diagnosis not present

## 2017-04-30 DIAGNOSIS — Z992 Dependence on renal dialysis: Secondary | ICD-10-CM | POA: Diagnosis not present

## 2017-05-03 DIAGNOSIS — Z992 Dependence on renal dialysis: Secondary | ICD-10-CM | POA: Diagnosis not present

## 2017-05-03 DIAGNOSIS — N186 End stage renal disease: Secondary | ICD-10-CM | POA: Diagnosis not present

## 2017-05-03 DIAGNOSIS — Z1159 Encounter for screening for other viral diseases: Secondary | ICD-10-CM | POA: Diagnosis not present

## 2017-05-03 DIAGNOSIS — Z794 Long term (current) use of insulin: Secondary | ICD-10-CM | POA: Diagnosis not present

## 2017-05-03 DIAGNOSIS — E119 Type 2 diabetes mellitus without complications: Secondary | ICD-10-CM | POA: Diagnosis not present

## 2017-05-03 DIAGNOSIS — N2581 Secondary hyperparathyroidism of renal origin: Secondary | ICD-10-CM | POA: Diagnosis not present

## 2017-05-03 DIAGNOSIS — D509 Iron deficiency anemia, unspecified: Secondary | ICD-10-CM | POA: Diagnosis not present

## 2017-05-03 DIAGNOSIS — D631 Anemia in chronic kidney disease: Secondary | ICD-10-CM | POA: Diagnosis not present

## 2017-05-05 DIAGNOSIS — D509 Iron deficiency anemia, unspecified: Secondary | ICD-10-CM | POA: Diagnosis not present

## 2017-05-05 DIAGNOSIS — Z992 Dependence on renal dialysis: Secondary | ICD-10-CM | POA: Diagnosis not present

## 2017-05-05 DIAGNOSIS — D631 Anemia in chronic kidney disease: Secondary | ICD-10-CM | POA: Diagnosis not present

## 2017-05-05 DIAGNOSIS — N2581 Secondary hyperparathyroidism of renal origin: Secondary | ICD-10-CM | POA: Diagnosis not present

## 2017-05-05 DIAGNOSIS — N186 End stage renal disease: Secondary | ICD-10-CM | POA: Diagnosis not present

## 2017-05-07 DIAGNOSIS — N2581 Secondary hyperparathyroidism of renal origin: Secondary | ICD-10-CM | POA: Diagnosis not present

## 2017-05-07 DIAGNOSIS — D631 Anemia in chronic kidney disease: Secondary | ICD-10-CM | POA: Diagnosis not present

## 2017-05-07 DIAGNOSIS — Z992 Dependence on renal dialysis: Secondary | ICD-10-CM | POA: Diagnosis not present

## 2017-05-07 DIAGNOSIS — D509 Iron deficiency anemia, unspecified: Secondary | ICD-10-CM | POA: Diagnosis not present

## 2017-05-07 DIAGNOSIS — N186 End stage renal disease: Secondary | ICD-10-CM | POA: Diagnosis not present

## 2017-05-10 DIAGNOSIS — N2581 Secondary hyperparathyroidism of renal origin: Secondary | ICD-10-CM | POA: Diagnosis not present

## 2017-05-10 DIAGNOSIS — N186 End stage renal disease: Secondary | ICD-10-CM | POA: Diagnosis not present

## 2017-05-10 DIAGNOSIS — Z992 Dependence on renal dialysis: Secondary | ICD-10-CM | POA: Diagnosis not present

## 2017-05-10 DIAGNOSIS — D631 Anemia in chronic kidney disease: Secondary | ICD-10-CM | POA: Diagnosis not present

## 2017-05-10 DIAGNOSIS — D509 Iron deficiency anemia, unspecified: Secondary | ICD-10-CM | POA: Diagnosis not present

## 2017-05-14 DIAGNOSIS — Z992 Dependence on renal dialysis: Secondary | ICD-10-CM | POA: Diagnosis not present

## 2017-05-14 DIAGNOSIS — N2581 Secondary hyperparathyroidism of renal origin: Secondary | ICD-10-CM | POA: Diagnosis not present

## 2017-05-14 DIAGNOSIS — N186 End stage renal disease: Secondary | ICD-10-CM | POA: Diagnosis not present

## 2017-05-14 DIAGNOSIS — D631 Anemia in chronic kidney disease: Secondary | ICD-10-CM | POA: Diagnosis not present

## 2017-05-14 DIAGNOSIS — D509 Iron deficiency anemia, unspecified: Secondary | ICD-10-CM | POA: Diagnosis not present

## 2017-05-17 DIAGNOSIS — D509 Iron deficiency anemia, unspecified: Secondary | ICD-10-CM | POA: Diagnosis not present

## 2017-05-17 DIAGNOSIS — D631 Anemia in chronic kidney disease: Secondary | ICD-10-CM | POA: Diagnosis not present

## 2017-05-17 DIAGNOSIS — Z992 Dependence on renal dialysis: Secondary | ICD-10-CM | POA: Diagnosis not present

## 2017-05-17 DIAGNOSIS — N186 End stage renal disease: Secondary | ICD-10-CM | POA: Diagnosis not present

## 2017-05-17 DIAGNOSIS — N2581 Secondary hyperparathyroidism of renal origin: Secondary | ICD-10-CM | POA: Diagnosis not present

## 2017-05-19 ENCOUNTER — Encounter: Payer: Self-pay | Admitting: Emergency Medicine

## 2017-05-19 ENCOUNTER — Inpatient Hospital Stay
Admission: EM | Admit: 2017-05-19 | Discharge: 2017-05-21 | DRG: 252 | Disposition: A | Discharge status: Home / Self Care | Payer: Medicare Other | Attending: Internal Medicine | Admitting: Internal Medicine

## 2017-05-19 ENCOUNTER — Inpatient Hospital Stay: Payer: Medicare Other

## 2017-05-19 DIAGNOSIS — N2581 Secondary hyperparathyroidism of renal origin: Secondary | ICD-10-CM | POA: Diagnosis not present

## 2017-05-19 DIAGNOSIS — I871 Compression of vein: Secondary | ICD-10-CM | POA: Diagnosis present

## 2017-05-19 DIAGNOSIS — D631 Anemia in chronic kidney disease: Secondary | ICD-10-CM | POA: Diagnosis not present

## 2017-05-19 DIAGNOSIS — Z881 Allergy status to other antibiotic agents status: Secondary | ICD-10-CM | POA: Diagnosis not present

## 2017-05-19 DIAGNOSIS — Z794 Long term (current) use of insulin: Secondary | ICD-10-CM | POA: Diagnosis not present

## 2017-05-19 DIAGNOSIS — Z79891 Long term (current) use of opiate analgesic: Secondary | ICD-10-CM | POA: Diagnosis not present

## 2017-05-19 DIAGNOSIS — D509 Iron deficiency anemia, unspecified: Secondary | ICD-10-CM | POA: Diagnosis not present

## 2017-05-19 DIAGNOSIS — E669 Obesity, unspecified: Secondary | ICD-10-CM | POA: Diagnosis present

## 2017-05-19 DIAGNOSIS — E1122 Type 2 diabetes mellitus with diabetic chronic kidney disease: Secondary | ICD-10-CM | POA: Diagnosis present

## 2017-05-19 DIAGNOSIS — N186 End stage renal disease: Secondary | ICD-10-CM | POA: Diagnosis present

## 2017-05-19 DIAGNOSIS — G894 Chronic pain syndrome: Secondary | ICD-10-CM | POA: Diagnosis not present

## 2017-05-19 DIAGNOSIS — E119 Type 2 diabetes mellitus without complications: Secondary | ICD-10-CM

## 2017-05-19 DIAGNOSIS — J449 Chronic obstructive pulmonary disease, unspecified: Secondary | ICD-10-CM

## 2017-05-19 DIAGNOSIS — M549 Dorsalgia, unspecified: Secondary | ICD-10-CM | POA: Diagnosis present

## 2017-05-19 DIAGNOSIS — Z886 Allergy status to analgesic agent status: Secondary | ICD-10-CM

## 2017-05-19 DIAGNOSIS — Z6832 Body mass index (BMI) 32.0-32.9, adult: Secondary | ICD-10-CM | POA: Diagnosis not present

## 2017-05-19 DIAGNOSIS — T82590A Other mechanical complication of surgically created arteriovenous fistula, initial encounter: Secondary | ICD-10-CM | POA: Diagnosis present

## 2017-05-19 DIAGNOSIS — R0602 Shortness of breath: Secondary | ICD-10-CM | POA: Diagnosis not present

## 2017-05-19 DIAGNOSIS — IMO0001 Reserved for inherently not codable concepts without codable children: Secondary | ICD-10-CM

## 2017-05-19 DIAGNOSIS — R109 Unspecified abdominal pain: Secondary | ICD-10-CM | POA: Diagnosis present

## 2017-05-19 DIAGNOSIS — Z888 Allergy status to other drugs, medicaments and biological substances status: Secondary | ICD-10-CM

## 2017-05-19 DIAGNOSIS — F17210 Nicotine dependence, cigarettes, uncomplicated: Secondary | ICD-10-CM | POA: Diagnosis present

## 2017-05-19 DIAGNOSIS — Y838 Other surgical procedures as the cause of abnormal reaction of the patient, or of later complication, without mention of misadventure at the time of the procedure: Secondary | ICD-10-CM | POA: Diagnosis present

## 2017-05-19 DIAGNOSIS — E1142 Type 2 diabetes mellitus with diabetic polyneuropathy: Secondary | ICD-10-CM | POA: Diagnosis present

## 2017-05-19 DIAGNOSIS — T8249XA Other complication of vascular dialysis catheter, initial encounter: Secondary | ICD-10-CM | POA: Diagnosis not present

## 2017-05-19 DIAGNOSIS — Z91018 Allergy to other foods: Secondary | ICD-10-CM

## 2017-05-19 DIAGNOSIS — F419 Anxiety disorder, unspecified: Secondary | ICD-10-CM | POA: Diagnosis present

## 2017-05-19 DIAGNOSIS — E7800 Pure hypercholesterolemia, unspecified: Secondary | ICD-10-CM | POA: Diagnosis present

## 2017-05-19 DIAGNOSIS — T82898A Other specified complication of vascular prosthetic devices, implants and grafts, initial encounter: Secondary | ICD-10-CM | POA: Diagnosis present

## 2017-05-19 DIAGNOSIS — M25512 Pain in left shoulder: Secondary | ICD-10-CM | POA: Diagnosis not present

## 2017-05-19 DIAGNOSIS — I70208 Unspecified atherosclerosis of native arteries of extremities, other extremity: Secondary | ICD-10-CM | POA: Diagnosis present

## 2017-05-19 DIAGNOSIS — J439 Emphysema, unspecified: Secondary | ICD-10-CM | POA: Diagnosis present

## 2017-05-19 DIAGNOSIS — Z992 Dependence on renal dialysis: Secondary | ICD-10-CM

## 2017-05-19 DIAGNOSIS — I10 Essential (primary) hypertension: Secondary | ICD-10-CM | POA: Diagnosis not present

## 2017-05-19 DIAGNOSIS — T82868A Thrombosis of vascular prosthetic devices, implants and grafts, initial encounter: Secondary | ICD-10-CM | POA: Diagnosis not present

## 2017-05-19 DIAGNOSIS — I120 Hypertensive chronic kidney disease with stage 5 chronic kidney disease or end stage renal disease: Secondary | ICD-10-CM | POA: Diagnosis not present

## 2017-05-19 LAB — BASIC METABOLIC PANEL
Anion gap: 12 (ref 5–15)
BUN: 24 mg/dL — ABNORMAL HIGH (ref 6–20)
CALCIUM: 8.2 mg/dL — AB (ref 8.9–10.3)
CO2: 26 mmol/L (ref 22–32)
CREATININE: 3.23 mg/dL — AB (ref 0.44–1.00)
Chloride: 96 mmol/L — ABNORMAL LOW (ref 101–111)
GFR calc non Af Amer: 15 mL/min — ABNORMAL LOW (ref >60)
GFR, EST AFRICAN AMERICAN: 18 mL/min — AB (ref >60)
Glucose, Bld: 162 mg/dL — ABNORMAL HIGH (ref 65–99)
Potassium: 3.6 mmol/L (ref 3.5–5.1)
SODIUM: 134 mmol/L — AB (ref 135–145)

## 2017-05-19 LAB — CBC WITH DIFFERENTIAL/PLATELET
BASOS PCT: 1 %
Basophils Absolute: 0.1 10*3/uL (ref 0–0.1)
EOS ABS: 0.1 10*3/uL (ref 0–0.7)
EOS PCT: 1 %
HCT: 31 % — ABNORMAL LOW (ref 35.0–47.0)
Hemoglobin: 10.7 g/dL — ABNORMAL LOW (ref 12.0–16.0)
Lymphocytes Relative: 16 %
Lymphs Abs: 1.2 10*3/uL (ref 1.0–3.6)
MCH: 32.3 pg (ref 26.0–34.0)
MCHC: 34.5 g/dL (ref 32.0–36.0)
MCV: 93.8 fL (ref 80.0–100.0)
MONO ABS: 0.5 10*3/uL (ref 0.2–0.9)
MONOS PCT: 6 %
Neutro Abs: 5.8 10*3/uL (ref 1.4–6.5)
Neutrophils Relative %: 76 %
Platelets: 150 10*3/uL (ref 150–440)
RBC: 3.3 MIL/uL — ABNORMAL LOW (ref 3.80–5.20)
RDW: 13.1 % (ref 11.5–14.5)
WBC: 7.6 10*3/uL (ref 3.6–11.0)

## 2017-05-19 LAB — PROTIME-INR
INR: 0.94
Prothrombin Time: 12.6 s (ref 11.4–15.2)

## 2017-05-19 LAB — MRSA PCR SCREENING: MRSA BY PCR: NEGATIVE

## 2017-05-19 LAB — GLUCOSE, CAPILLARY: Glucose-Capillary: 115 mg/dL — ABNORMAL HIGH (ref 65–99)

## 2017-05-19 LAB — APTT: APTT: 56 s — AB (ref 24–36)

## 2017-05-19 MED ORDER — MORPHINE SULFATE (PF) 4 MG/ML IV SOLN
4.0000 mg | Freq: Once | INTRAVENOUS | Status: AC
  Administered 2017-05-19: 4 mg via INTRAVENOUS

## 2017-05-19 MED ORDER — INSULIN ASPART 100 UNIT/ML ~~LOC~~ SOLN: 0.0000 [IU] | Freq: Every day | SUBCUTANEOUS | Status: DC

## 2017-05-19 MED ORDER — DILTIAZEM HCL ER 60 MG PO CP12
120.0000 mg | ORAL_CAPSULE | Freq: Two times a day (BID) | ORAL | Status: DC
  Administered 2017-05-19 – 2017-05-21 (×2): 120 mg via ORAL
  Filled 2017-05-19 (×6): qty 2

## 2017-05-19 MED ORDER — LISINOPRIL 20 MG PO TABS
20.0000 mg | ORAL_TABLET | Freq: Every day | ORAL | Status: DC
  Administered 2017-05-19: 20 mg via ORAL
  Filled 2017-05-19: qty 1

## 2017-05-19 MED ORDER — POLYETHYLENE GLYCOL 3350 17 G PO PACK: 17.0000 g | PACK | Freq: Every day | ORAL | Status: DC | PRN

## 2017-05-19 MED ORDER — CALCIUM ACETATE (PHOS BINDER) 667 MG PO CAPS
1334.0000 mg | ORAL_CAPSULE | Freq: Three times a day (TID) | ORAL | Status: DC
  Administered 2017-05-21: 1334 mg via ORAL
  Filled 2017-05-19: qty 2

## 2017-05-19 MED ORDER — MORPHINE SULFATE (PF) 4 MG/ML IV SOLN
INTRAVENOUS | Status: AC
  Filled 2017-05-19: qty 1

## 2017-05-19 MED ORDER — ONDANSETRON HCL 4 MG PO TABS: 4.0000 mg | ORAL_TABLET | Freq: Four times a day (QID) | ORAL | Status: DC | PRN

## 2017-05-19 MED ORDER — ONDANSETRON HCL 4 MG/2ML IJ SOLN
4.0000 mg | Freq: Four times a day (QID) | INTRAMUSCULAR | Status: DC | PRN
  Administered 2017-05-20: 4 mg via INTRAVENOUS
  Filled 2017-05-19: qty 2

## 2017-05-19 MED ORDER — MORPHINE SULFATE (PF) 4 MG/ML IV SOLN
4.0000 mg | Freq: Once | INTRAVENOUS | Status: AC
Start: 2017-05-19 — End: 2017-05-19
  Administered 2017-05-19: 4 mg via INTRAVENOUS
  Filled 2017-05-19: qty 1

## 2017-05-19 MED ORDER — INSULIN ASPART 100 UNIT/ML ~~LOC~~ SOLN
0.0000 [IU] | Freq: Three times a day (TID) | SUBCUTANEOUS | Status: DC
  Administered 2017-05-20: 3 [IU] via SUBCUTANEOUS
  Administered 2017-05-20: 1 [IU] via SUBCUTANEOUS
  Filled 2017-05-19: qty 1
  Filled 2017-05-19: qty 3

## 2017-05-19 MED ORDER — ALBUTEROL SULFATE (2.5 MG/3ML) 0.083% IN NEBU: 2.5000 mg | INHALATION_SOLUTION | RESPIRATORY_TRACT | Status: DC | PRN

## 2017-05-19 MED ORDER — PROMETHAZINE HCL 25 MG PO TABS
25.0000 mg | ORAL_TABLET | ORAL | Status: DC | PRN
  Filled 2017-05-19: qty 1

## 2017-05-19 MED ORDER — OXYCODONE HCL 5 MG PO TABS
10.0000 mg | ORAL_TABLET | ORAL | Status: DC | PRN
  Administered 2017-05-19 – 2017-05-20 (×4): 10 mg via ORAL
  Filled 2017-05-19 (×3): qty 2

## 2017-05-19 MED ORDER — MORPHINE SULFATE (PF) 4 MG/ML IV SOLN
4.0000 mg | INTRAVENOUS | Status: DC | PRN
  Administered 2017-05-19 – 2017-05-21 (×4): 4 mg via INTRAVENOUS
  Filled 2017-05-19 (×3): qty 1

## 2017-05-19 MED ORDER — OXYCODONE HCL 5 MG PO TABS
ORAL_TABLET | ORAL | Status: AC
  Filled 2017-05-19: qty 2

## 2017-05-19 MED ORDER — ONDANSETRON HCL 4 MG/2ML IJ SOLN
4.0000 mg | INTRAMUSCULAR | Status: AC
  Administered 2017-05-19: 4 mg via INTRAVENOUS
  Filled 2017-05-19: qty 2

## 2017-05-19 MED ORDER — TRAMADOL HCL 50 MG PO TABS: 50.0000 mg | ORAL_TABLET | Freq: Four times a day (QID) | ORAL | Status: DC | PRN

## 2017-05-19 NOTE — ED Triage Notes (Signed)
Pt arrived via EMS from dialysis for reports of severe left shoulder, neck and ear pain. EMS reports warm to touch and swollen above dialysis access and cold to touch distally. EMS 134/76, NSR. Dr. Karma Greaser at bedside. Left radial and ulnar pulses auscultated with doppler.

## 2017-05-19 NOTE — ED Notes (Signed)
pts left arm elevated on pillows, ice pack applied.

## 2017-05-19 NOTE — Progress Notes (Signed)
Chaplain received an order to visit with pt in room 228. Chaplain spoke with pt for awhile. Pt was in great pain awaiting her pain meds to kick in. Pt was crying, and Chaplain provided emotional support as well as prayer.    05/19/17 2025  Clinical Encounter Type  Visited With Patient  Visit Type Initial;Spiritual support  Referral From Nurse  Consult/Referral To Chaplain  Spiritual Encounters  Spiritual Needs Prayer

## 2017-05-19 NOTE — ED Provider Notes (Signed)
[facility] Emergency Department Provider Note  ____________________________________________   First MD Initiated Contact with Patient 05/19/17 1536     (approximate)  I have reviewed the triage vital signs and the nursing notes.   HISTORY  Chief Complaint Shoulder Pain; Neck Pain; and Problem with dialysis access    HPI [name] is a 51 y.o. female with extensive chronic medical history including end-stage renal disease on dialysis Monday Wednesday Friday, diabetes, COPD, CHF, hepatitis, tobacco dependence, anxiety who presents by EMS from dialysis for evaluation of severe pain in her left upper extremity radiating into her neck and decreased sensation and perfusion to the distal left upper extremity.  She reports that her dialysis site infiltrated 2 days ago but that they felt the issue was resolved.  She reports that the pain started gradually as soon as dialysis started this morning but approximately 10 minutes prior to arrival in the ED the pain became severe and she reported acute onset of mild numbness and tingling in the fourth and fifth digits of her left hand with pain radiating from the shoulder.  Additionally the pain is sharp and aching and radiating from the left arm into the left side of her neck.  She is also complaining of pain in her ears.  She denies chest pain and shortness of breath.  She denies fever/chills, nausea, vomiting, abdominal pain.  She reports all of her pain as 10 out of 10 and nothing makes it better or worse.  She has had issues with infiltration of her dialysis site in the past but nothing this severe.  She also has swelling in the left upper arm.  She is still able to move her fingers without difficulty.  Past Medical History:  Diagnosis Date  . Anemia   . Anxiety   . Arthritis   . CHF (congestive heart failure) (HCC)    history of, has been cleared.  . Chronic kidney disease   . COPD (chronic obstructive pulmonary  disease) ([place])   . Depression   . Diabetes mellitus without complication ([place])   . Emphysema of lung ([place])   . End stage renal disease ([place])   . Headache    migraines  . Hepatitis 2003   Hep C  . Hypercholesterolemia    pt denies  . Hypertension   . Peripheral vascular disease ([place])   . Pneumonia 2015  . Tobacco dependence     Patient Active Problem List   Diagnosis Date Noted  . Dialysis AV fistula malfunction ([place]) 05/19/2017  . Hyperkalemia 01/08/2017  . Generalized abdominal pain 12/31/2016  . Diabetes ([place]) 12/18/2016  . SMA stenosis ([place]) 12/18/2016  . Atypical chest pain 10/20/2016  . Kidney transplant candidate 08/11/2016  . MRSA (methicillin resistant Staphylococcus aureus) infection 07/18/2016  . Status post incision and drainage 07/18/2016  . Chronic ulcer of left foot ([place]) 07/18/2016  . Acquired palmar and plantar hyperkeratosis 07/18/2016  . Elevated troponin 07/18/2016  . Essential hypertension 07/18/2016  . Epistaxis 07/18/2016  . Tobacco abuse 07/18/2016  . ESRD on dialysis ([place]) 07/18/2016  . Orbital cellulitis on right 07/13/2016  . Cellulitis 06/06/2016  . Adjustment disorder with mixed disturbance of emotions and conduct 05/11/2016  . Dysthymia 05/11/2016  . Diabetic macular edema ([place]) 08/24/2013  . Hypertensive retinopathy of both eyes 08/24/2013  . Non-proliferative diabetic retinopathy, severe, both eyes ([place]) 08/24/2013  . Psoriatic arthropathy ([place]) 11/14/2012    Past Surgical History:  Procedure Laterality Date  .  AV FISTULA PLACEMENT Left 11/2014  . CHOLECYSTECTOMY    . cyst removed  from left hand Left 1989  . INCISION AND DRAINAGE ABSCESS N/A 07/16/2016   Procedure: INCISION AND DRAINAGE ABSCESS;  Surgeon: Carloyn Manner, MD;  Location: [facility] ORS;  Service: ENT;  Laterality: N/A;  . PERIPHERAL VASCULAR CATHETERIZATION N/A 07/25/2015   Procedure: A/V Shuntogram/Fistulagram;  Surgeon: Algernon Huxley, MD;  Location: [place] [facility];   Service: Cardiovascular;  Laterality: N/A;  . PERIPHERAL VASCULAR CATHETERIZATION Left 07/25/2015   Procedure: A/V Shunt Intervention;  Surgeon: Algernon Huxley, MD;  Location: [place] [facility];  Service: Cardiovascular;  Laterality: Left;  . PERIPHERAL VASCULAR CATHETERIZATION Left 10/07/2015   Procedure: A/V Shuntogram/Fistulagram;  Surgeon: Algernon Huxley, MD;  Location: [place] [facility];  Service: Cardiovascular;  Laterality: Left;  . PERIPHERAL VASCULAR CATHETERIZATION N/A 10/07/2015   Procedure: A/V Shunt Intervention;  Surgeon: Algernon Huxley, MD;  Location: [place] [facility];  Service: Cardiovascular;  Laterality: N/A;  . PERIPHERAL VASCULAR CATHETERIZATION  10/07/2015   Procedure: Dialysis/Perma Catheter Insertion;  Surgeon: Algernon Huxley, MD;  Location: [place] [facility];  Service: Cardiovascular;;  . PERIPHERAL VASCULAR CATHETERIZATION N/A 12/17/2015   Procedure: Dialysis/Perma Catheter Removal;  Surgeon: Katha Cabal, MD;  Location: [place] [facility];  Service: Cardiovascular;  Laterality: N/A;  . PERIPHERAL VASCULAR CATHETERIZATION N/A 01/11/2017   Procedure: Visceral Angiography;  Surgeon: Algernon Huxley, MD;  Location: [place] [facility];  Service: Cardiovascular;  Laterality: N/A;  . PERIPHERAL VASCULAR CATHETERIZATION N/A 01/11/2017   Procedure: Visceral Artery Intervention;  Surgeon: Algernon Huxley, MD;  Location: [place] [facility];  Service: Cardiovascular;  Laterality: N/A;  . rt. tubal and ovary removed    . TONSILLECTOMY      Prior to Admission medications   Medication Sig Start Date End Date Taking? Authorizing Provider  b complex-vitamin c-folic acid (NEPHRO-VITE) 0.8 MG TABS tablet Take 1 tablet by mouth daily. On dialysis days takes in evening and on non-dialysis days morning   Yes [provider]  calcium acetate (PHOSLO) 667 MG capsule Take 1,334 mg by mouth 3 (three) times daily with meals.    Yes [provider]  clobetasol ointment  (TEMOVATE) 3.53 % Apply 1 application topically daily as needed (for psorasis).    Yes [provider]  diltiazem (CARDIZEM SR) 120 MG 12 hr capsule Take 120 mg by mouth 2 (two) times daily.  12/03/16  Yes [provider]  lisinopril (PRINIVIL,ZESTRIL) 20 MG tablet Take 20 mg by mouth at bedtime.   Yes [provider]  promethazine (PHENERGAN) 25 MG tablet Take 1 tablet (25 mg total) by mouth every 4 (four) hours as needed for nausea or vomiting. 10/19/16  Yes [name], MD    Allergies Cinnamon; Cinoxate; Tylenol [acetaminophen]; Ciprofloxacin; Garlic; Onion; and Prednisone  Family History  Problem Relation Age of Onset  . Hypertension Mother   . Heart disease Mother   . Hypertension Father     Social History Social History  Substance Use Topics  . Smoking status: Current Every Day Smoker    Packs/day: 0.25    Years: 17.00    Types: Cigarettes  . Smokeless tobacco: Never Used  . Alcohol use No    Review of Systems Constitutional: No fever/chills Eyes: No visual changes. ENT: Pain in bilateral ears Cardiovascular: Denies chest pain.  Does report some tightness in her chest Respiratory: Denies shortness of breath. Gastrointestinal:  No abdominal pain.  No nausea, no vomiting.  No diarrhea.  No constipation. Genitourinary: Negative for dysuria. Musculoskeletal: Swelling in the left upper extremity around the dialysis site with pain radiating up into her shoulder and left side of her neck as well as down into her hand.  She has numbness and a sensation of cold down into the fourth and fifth digit of her left hand Integumentary: Negative for rash. Neurological: Numbness and tingling into the ulnar aspect of her left upper extremity down into the hand.  No weakness in her extremities.   ____________________________________________   PHYSICAL EXAM:  VITAL SIGNS: ED Triage Vitals  Enc Vitals Group     BP --      Pulse Rate 05/19/17 1534  (!) 102     Resp 05/19/17 1534 20     Temp 05/19/17 1534 98.8 F (37.1 C)     Temp Source 05/19/17 1534 Oral     SpO2 05/19/17 1534 98 %     Weight 05/19/17 1534 93 kg (205 lb 0.4 oz)     Height 05/19/17 1534 1.702 m (5\' 7" )     Head Circumference --      Peak Flow --      Pain Score 05/19/17 1532 10     Pain Loc --      Pain Edu? --      Excl. in [place]? --     Constitutional: Alert and oriented. The patient appears to be in severe pain Eyes: Conjunctivae are normal.  Head: Atraumatic. Nose: No congestion/rhinnorhea. Mouth/Throat: Mucous membranes are moist. Neck: No stridor.  No meningeal signs.  No bruits.  No swelling Cardiovascular: Borderline tachycardia, regular rhythm. Good peripheral circulation. Grossly normal heart sounds.  No anterior chest swelling Respiratory: Normal respiratory effort.  No retractions. Lungs CTAB. Gastrointestinal: Obese.  Soft and nontender. No distention.  Musculoskeletal: Swelling with what appears to be some ecchymosis and the left upper extremity above the catheter site but with soft and easily compressible compartments.  She has no palpable or visible swelling up through her shoulder, anterior chest, or left side of her neck.  Radial pulse is easily palpable in the right upper extremity.  It is not palpable in the left upper extremity but with the Doppler it is easily auscultated, as is the ulnar artery on the affected side (left).  The patient has no swelling in the extremity distal to the dialysis site.  The palpable temperature of the left arm is normal and equal to the right until the hand, and the ulnar distribution of the left hand is objectively cooler to the touch than the rest of her left hand and When compared to the right hand. Neurologic:  Normal speech and language. No gross focal neurologic deficits are appreciated although the patient reports numbness in the ulnar distribution of the left hand Skin:  Skin is warm, dry and intact. No rash  noted. Psychiatric: Mood and affect are anxious and sobbing, otherwise normal  ____________________________________________   LABS (all labs ordered are listed, but only abnormal results are displayed)  Labs Reviewed  CBC WITH DIFFERENTIAL/PLATELET - Abnormal; Notable for the following:       Result Value   RBC 3.30 (*)    Hemoglobin 10.7 (*)    HCT 31.0 (*)    All other components within normal limits  BASIC METABOLIC PANEL - Abnormal; Notable for the following:    Sodium 134 (*)    Chloride 96 (*)  Glucose, Bld 162 (*)    BUN 24 (*)    Creatinine, Ser 3.23 (*)    Calcium 8.2 (*)    GFR calc non Af Amer 15 (*)    GFR calc Af Amer 18 (*)    All other components within normal limits  APTT - Abnormal; Notable for the following:    aPTT 56 (*)    All other components within normal limits  PROTIME-INR  HEMOGLOBIN T1X  BASIC METABOLIC PANEL  CBC   ____________________________________________  EKG  ED ECG REPORT I, Laini Urick, the attending physician, personally viewed and interpreted this ECG.  Date: 05/19/2017 EKG Time: 15:36 Rate: 105 Rhythm: Sinus tachycardia QRS Axis: normal Intervals: normal ST/T Wave abnormalities: Non-specific ST segment / T-wave changes, but no evidence of acute ischemia. Conduction Disturbances: none Narrative Interpretation: No evidence of acute ischemia  ____________________________________________  RADIOLOGY   Dg Chest 2 View  Result Date: 05/19/2017 CLINICAL DATA:  51 y/o  F; shortness of breath. EXAM: CHEST  2 VIEW COMPARISON:  04/09/2017 chest radiograph FINDINGS: The heart size and mediastinal contours are within normal limits. Both lungs are clear. Mild degenerative changes of the thoracic spine. IMPRESSION: No active cardiopulmonary disease. Electronically Signed   By: Kristine Garbe M.D.   On: 05/19/2017 17:38    ____________________________________________   PROCEDURES  Critical Care performed:  No   Procedure(s) performed:   Procedures   ____________________________________________   INITIAL IMPRESSION / ASSESSMENT AND PLAN / ED COURSE  Pertinent labs & imaging results that were available during my care of the patient were reviewed by me and considered in my medical decision making (see chart for details).  I paged vascular surgery immediately after I was able to Doppler ulnar and radial pulses in the left upper extremity but I am concerned about the probability that she is having swelling most likely due to infiltration of her dialysis site that is leading to neurovascular compromise and likely leading to a steal syndrome. Imaging unlikely to be helpful at this time, deferring to Vascular.  Analgesia, basic labs, paging Dr. Lucky Cowboy.  Clinical Course as of May 19 1854  Wed May 19, 2017  1600 Spoke by phone with Dr. Lucky Cowboy with vascular surgery.  He knows the patient well.  I explained to him the clinical signs and symptoms with which we are dealing.  He advised that there is no imaging that would be particularly helpful right now.  He feels the neurovascular compromise is most likely due to infiltration of the dialysis site.  He recommended elevation of the extremity, ice packs may be useful, and admission to the hospitalist service to manage her chronic medical issues and he will consult and plan to take her to the operating room tomorrow possibly for a permacath and angiography.  I updated the patient.  He recommended against starting her on heparin as bleeding may be part of the issue at this time.  [CF]  1610 Of note, I marked with a pen on her left wrist where we Dopplered the radial and ulnar pulses.  [CF]    Clinical Course User Index [CF] Hinda Kehr, MD    ____________________________________________  FINAL CLINICAL IMPRESSION(S) / ED DIAGNOSES  Final diagnoses:  Steal syndrome as complication of dialysis access, initial encounter ([place])  Problem with dialysis access,  initial encounter ([place])  ESRD (end stage renal disease) on dialysis ([place])  Insulin dependent diabetes mellitus ([place])  Chronic obstructive pulmonary disease, unspecified COPD type ([place])  MEDICATIONS GIVEN DURING THIS VISIT:  Medications  insulin aspart (novoLOG) injection 0-9 Units (not administered)  insulin aspart (novoLOG) injection 0-5 Units (not administered)  oxyCODONE (Oxy IR/ROXICODONE) immediate release tablet 10 mg (10 mg Oral Given 05/19/17 1753)  polyethylene glycol (MIRALAX / GLYCOLAX) packet 17 g (not administered)  ondansetron (ZOFRAN) tablet 4 mg (not administered)    Or  ondansetron (ZOFRAN) injection 4 mg (not administered)  albuterol (PROVENTIL) (2.5 MG/3ML) 0.083% nebulizer solution 2.5 mg (not administered)  morphine 4 MG/ML injection 4 mg (4 mg Intravenous Given 05/19/17 1849)  traMADol (ULTRAM) tablet 50 mg (not administered)  morphine 4 MG/ML injection 4 mg (4 mg Intravenous Given 05/19/17 1555)  ondansetron (ZOFRAN) injection 4 mg (4 mg Intravenous Given 05/19/17 1554)  morphine 4 MG/ML injection 4 mg (4 mg Intravenous Given 05/19/17 1631)     NEW OUTPATIENT MEDICATIONS STARTED DURING THIS VISIT:  New Prescriptions   No medications on file    Modified Medications   No medications on file    Discontinued Medications   DICYCLOMINE (BENTYL) 20 MG TABLET    Take 1 tablet (20 mg total) by mouth 4 (four) times daily -  before meals and at bedtime.   HYDRALAZINE (APRESOLINE) 100 MG TABLET    Take 100 mg by mouth 2 (two) times daily.   INSULIN REGULAR (NOVOLIN R,HUMULIN R) 100 UNITS/ML INJECTION    Inject 5-10 Units into the skin 3 (three) times daily before meals. Pt uses per sliding scale.   LOPERAMIDE (IMODIUM A-D) 2 MG TABLET    Take 1 tablet (2 mg total) by mouth every other day as needed for diarrhea or loose stools.   OXYCODONE (OXYCONTIN) 10 MG 12 HR TABLET    Take 10 mg by mouth every 12 (twelve) hours.   OXYCODONE (ROXICODONE) 5 MG IMMEDIATE RELEASE  TABLET    Take 1 tablet (5 mg total) by mouth every 8 (eight) hours as needed.   RANITIDINE (ZANTAC) 150 MG TABLET    Take 1 tablet (150 mg total) by mouth 2 (two) times daily.   SEVELAMER CARBONATE (RENVELA) 800 MG TABLET    Take 1,600 mg by mouth See admin instructions. 2 tablets with meals and snacks.     Note:  This document was prepared using Dragon voice recognition software and may include unintentional dictation errors.    Hinda Kehr, MD 05/19/17 [phone]

## 2017-05-19 NOTE — H&P (Signed)
[facility] NAME: [name]    MR#:  [id number]  DATE OF BIRTH:  1966/10/12  DATE OF ADMISSION:  05/19/2017  PRIMARY CARE PHYSICIAN: Josephine Cables, MD   REQUESTING/REFERRING PHYSICIAN: Dr. Karma Greaser  CHIEF COMPLAINT:   Chief Complaint  Patient presents with  . Shoulder Pain  . Neck Pain  . Problem with dialysis access    HISTORY OF PRESENT ILLNESS:  [name]  is a 51 y.o. female with a known history of ESRD, AVF, Hypertension, diabetes, chronic pain syndrome presents to the emergency room sent in from dialysis center after she had acute onset of pain around the AV fistula site with swelling. She had infiltration around the fistula site on Monday. This was monitored. Today this caught worse with swelling around the fistula site with cold and beyond her fistula on the left arm with some tingling and numbness. No cyanosis or gangrene. Vascular surgery was contacted by emergency room. Admission to the hospital and angiogram tomorrow morning. Will need angiogram earlier if there is any acute worsening. Patient does have chronic pain syndrome on oxycodone. She is to go to the pain clinic but has stopped going there as they did not want the medications that she requested.  Today patient has multiple complaints which are all related to pain in her back stomach and extremities. Most of my time in her room was spent discussing regarding her chronic pain, pain medications and complaints regarding the pain clinic.  PAST MEDICAL HISTORY:   Past Medical History:  Diagnosis Date  . Anemia   . Anxiety   . Arthritis   . CHF (congestive heart failure) (HCC)    history of, has been cleared.  . Chronic kidney disease   . COPD (chronic obstructive pulmonary disease) ([place])   . Depression   . Diabetes mellitus without complication ([place])   . Emphysema of lung ([place])   . End stage renal disease ([place])   . Headache    migraines  . Hepatitis  2003   Hep C  . Hypercholesterolemia    pt denies  . Hypertension   . Peripheral vascular disease ([place])   . Pneumonia 2015  . Tobacco dependence     PAST SURGICAL HISTORY:   Past Surgical History:  Procedure Laterality Date  . AV FISTULA PLACEMENT Left 11/2014  . CHOLECYSTECTOMY    . cyst removed  from left hand Left 1989  . INCISION AND DRAINAGE ABSCESS N/A 07/16/2016   Procedure: INCISION AND DRAINAGE ABSCESS;  Surgeon: Carloyn Manner, MD;  Location: [facility];  Service: ENT;  Laterality: N/A;  . PERIPHERAL VASCULAR CATHETERIZATION N/A 07/25/2015   Procedure: A/V Shuntogram/Fistulagram;  Surgeon: Algernon Huxley, MD;  Location: [place];  Service: Cardiovascular;  Laterality: N/A;  . PERIPHERAL VASCULAR CATHETERIZATION Left 07/25/2015   Procedure: A/V Shunt Intervention;  Surgeon: Algernon Huxley, MD;  Location: [place] [facility];  Service: Cardiovascular;  Laterality: Left;  . PERIPHERAL VASCULAR CATHETERIZATION Left 10/07/2015   Procedure: A/V Shuntogram/Fistulagram;  Surgeon: Algernon Huxley, MD;  Location: [facility];  Service: Cardiovascular;  Laterality: Left;  . PERIPHERAL VASCULAR CATHETERIZATION N/A 10/07/2015   Procedure: A/V Shunt Intervention;  Surgeon: Algernon Huxley, MD;  Location: [place] [facility];  Service: Cardiovascular;  Laterality: N/A;  . PERIPHERAL VASCULAR CATHETERIZATION  10/07/2015   Procedure: Dialysis/Perma Catheter Insertion;  Surgeon: Algernon Huxley, MD;  Location: [name] [facility];  Service:  Cardiovascular;;  . PERIPHERAL VASCULAR CATHETERIZATION N/A 12/17/2015   Procedure: Dialysis/Perma Catheter Removal;  Surgeon: Katha Cabal, MD;  Location: [facility];  Service: Cardiovascular;  Laterality: N/A;  . PERIPHERAL VASCULAR CATHETERIZATION N/A 01/11/2017   Procedure: Visceral Angiography;  Surgeon: Algernon Huxley, MD;  Location: [place] [facility] [place] [facility];  Service: Cardiovascular;  Laterality: N/A;  . PERIPHERAL VASCULAR CATHETERIZATION  N/A 01/11/2017   Procedure: Visceral Artery Intervention;  Surgeon: Algernon Huxley, MD;  Location: [facility];  Service: Cardiovascular;  Laterality: N/A;  . rt. tubal and ovary removed    . TONSILLECTOMY      SOCIAL HISTORY:   Social History  Substance Use Topics  . Smoking status: Current Every Day Smoker    Packs/day: 0.25    Years: 17.00    Types: Cigarettes  . Smokeless tobacco: Never Used  . Alcohol use No    FAMILY HISTORY:   Family History  Problem Relation Age of Onset  . Hypertension Mother   . Heart disease Mother   . Hypertension Father     DRUG ALLERGIES:   Allergies  Allergen Reactions  . Cinnamon Anaphylaxis  . Cinoxate Anaphylaxis  . Tylenol [Acetaminophen] Anaphylaxis  . Ciprofloxacin Diarrhea  . Garlic Hives  . Onion Hives and Swelling  . Prednisone Other (See Comments)    Vaginal blisters & oral blisters    REVIEW OF SYSTEMS:   Review of Systems  Constitutional: Positive for malaise/fatigue. Negative for chills and fever.  HENT: Negative for sore throat.   Eyes: Negative for blurred vision, double vision and pain.  Respiratory: Negative for cough, hemoptysis, shortness of breath and wheezing.   Cardiovascular: Negative for chest pain, palpitations, orthopnea and leg swelling.  Gastrointestinal: Negative for abdominal pain, constipation, diarrhea, heartburn, nausea and vomiting.  Genitourinary: Negative for dysuria and hematuria.  Musculoskeletal: Positive for back pain. Negative for joint pain.  Skin: Negative for rash.  Neurological: Positive for tingling and weakness. Negative for sensory change, speech change, focal weakness and headaches.  Endo/Heme/Allergies: Does not bruise/bleed easily.  Psychiatric/Behavioral: Negative for depression. The patient is not nervous/anxious.    MEDICATIONS AT HOME:   Prior to Admission medications   Medication Sig Start Date End Date Taking? Authorizing Provider  b complex-vitamin c-folic acid  (NEPHRO-VITE) 0.8 MG TABS tablet Take 1 tablet by mouth daily. On dialysis days takes in evening and on non-dialysis days morning   Yes [provider]  calcium acetate (PHOSLO) 667 MG capsule Take 1,334 mg by mouth 3 (three) times daily with meals.    Yes [provider]  clobetasol ointment (TEMOVATE) 3.53 % Apply 1 application topically daily as needed (for psorasis).    Yes [provider]  diltiazem (CARDIZEM SR) 120 MG 12 hr capsule Take 120 mg by mouth 2 (two) times daily.  12/03/16  Yes [provider]  lisinopril (PRINIVIL,ZESTRIL) 20 MG tablet Take 20 mg by mouth at bedtime.   Yes [provider]  promethazine (PHENERGAN) 25 MG tablet Take 1 tablet (25 mg total) by mouth every 4 (four) hours as needed for nausea or vomiting. 10/19/16  Yes [name], MD     VITAL SIGNS:  Pulse (!) 102, temperature 98.8 F (37.1 C), temperature source Oral, resp. rate 20, height 5\' 7"  (1.702 m), weight 93 kg (205 lb 0.4 oz), SpO2 98 %.  PHYSICAL EXAMINATION:  Physical Exam  GENERAL:  51 y.o.-year-old patient lying in the bed with no acute  distress.  EYES: Pupils equal, round, reactive to light and accommodation. No scleral icterus. Extraocular muscles intact.  HEENT: Head atraumatic, normocephalic. Oropharynx and nasopharynx clear. No oropharyngeal erythema, moist oral mucosa  NECK:  Supple, no jugular venous distention. No thyroid enlargement, no tenderness.  LUNGS: Normal breath sounds bilaterally, no wheezing, rales, rhonchi. No use of accessory muscles of respiration.  CARDIOVASCULAR: S1, S2 normal. No murmurs, rubs, or gallops.  ABDOMEN: Soft, nondistended. Bowel sounds present. No organomegaly or mass. Diffuse tenderness that is chronic EXTREMITIES: No pedal edema, cyanosis, or clubbing. + 2 pedal & radial pulses b/l.   NEUROLOGIC: Cranial nerves II through XII are intact. No focal Motor or sensory deficits appreciated b/l PSYCHIATRIC: The  patient is alert and oriented x 3. Extremely anxious and tearful. SKIN: No obvious rash, lesion, or ulcer.  AV fistula site on left upper extremities tender. Diffuse swelling around the site. Cold left hand with sensations intact. Tender.  LABORATORY PANEL:   CBC  Recent Labs Lab 05/19/17 1554  WBC 7.6  HGB 10.7*  HCT 31.0*  PLT 150   ------------------------------------------------------------------------------------------------------------------  Chemistries  No results for input(s): NA, K, CL, CO2, GLUCOSE, BUN, CREATININE, CALCIUM, MG, AST, ALT, ALKPHOS, BILITOT in the last 168 hours.  Invalid input(s): GFRCGP ------------------------------------------------------------------------------------------------------------------  Cardiac Enzymes No results for input(s): TROPONINI in the last 168 hours. ------------------------------------------------------------------------------------------------------------------  RADIOLOGY:  No results found.   IMPRESSION AND PLAN:   * AV fistula dysfunction with likely a bleeding around the site. Does have some vascular compromise distally. Vascular surgery aware. Patient will need angiogram tomorrow morning along with the PermCath placement. No anticoagulation at this time Pain medications added for pain control. She did receive dialysis today. [facility] consult nephrology for further dialysis needs  * Hypertension. Continue medications  * Diabetes mellitus. Home medications and sliding scale insulin.  * End stage renal disease on hemodialysis. Nephrology consult  * Chronic pain syndrome. Pain medications added through IV in addition to oxycodone that she takes at home.  * DVT prophylaxis with SCDs.  All the records are reviewed and case discussed with ED provider. Management plans discussed with the patient, family and they are in agreement.  CODE STATUS: FULL CODE  TOTAL TIME TAKING CARE OF THIS PATIENT: 45 minutes.   Hillary Bow R M.D on 05/19/2017 at 5:01 PM  Between 7am to 6pm - Pager - [phone]  After 6pm go to [email or web address] - password [email or web address] [facility]  Office  [phone]  CC: Primary care physician; Josephine Cables, MD  Note: This dictation was prepared with Dragon dictation along with smaller phrase technology. Any transcriptional errors that result from this process are unintentional.

## 2017-05-20 ENCOUNTER — Encounter: Payer: Self-pay | Admitting: *Deleted

## 2017-05-20 ENCOUNTER — Encounter
Admission: EM | Disposition: A | Discharge status: Home / Self Care | Payer: Self-pay | Source: Home / Self Care | Attending: Internal Medicine

## 2017-05-20 DIAGNOSIS — I10 Essential (primary) hypertension: Secondary | ICD-10-CM

## 2017-05-20 DIAGNOSIS — N186 End stage renal disease: Secondary | ICD-10-CM

## 2017-05-20 DIAGNOSIS — T82868A Thrombosis of vascular prosthetic devices, implants and grafts, initial encounter: Secondary | ICD-10-CM

## 2017-05-20 DIAGNOSIS — E119 Type 2 diabetes mellitus without complications: Secondary | ICD-10-CM

## 2017-05-20 DIAGNOSIS — Z992 Dependence on renal dialysis: Secondary | ICD-10-CM | POA: Diagnosis not present

## 2017-05-20 HISTORY — PX: DIALYSIS/PERMA CATHETER INSERTION: CATH118288

## 2017-05-20 LAB — CBC
HCT: 27.3 % — ABNORMAL LOW (ref 35.0–47.0)
Hemoglobin: 9.5 g/dL — ABNORMAL LOW (ref 12.0–16.0)
MCH: 33.1 pg (ref 26.0–34.0)
MCHC: 34.9 g/dL (ref 32.0–36.0)
MCV: 94.8 fL (ref 80.0–100.0)
PLATELETS: 126 10*3/uL — AB (ref 150–440)
RBC: 2.87 MIL/uL — AB (ref 3.80–5.20)
RDW: 13.1 % (ref 11.5–14.5)
WBC: 8 10*3/uL (ref 3.6–11.0)

## 2017-05-20 LAB — HEMOGLOBIN A1C
Hgb A1c MFr Bld: 8 % — ABNORMAL HIGH (ref 4.8–5.6)
Mean Plasma Glucose: 183 mg/dL

## 2017-05-20 LAB — BASIC METABOLIC PANEL
Anion gap: 10 (ref 5–15)
BUN: 34 mg/dL — AB (ref 6–20)
CHLORIDE: 97 mmol/L — AB (ref 101–111)
CO2: 27 mmol/L (ref 22–32)
CREATININE: 4.43 mg/dL — AB (ref 0.44–1.00)
Calcium: 7.5 mg/dL — ABNORMAL LOW (ref 8.9–10.3)
GFR calc non Af Amer: 11 mL/min — ABNORMAL LOW (ref >60)
GFR, EST AFRICAN AMERICAN: 12 mL/min — AB (ref >60)
Glucose, Bld: 249 mg/dL — ABNORMAL HIGH (ref 65–99)
Potassium: 4.5 mmol/L (ref 3.5–5.1)
Sodium: 134 mmol/L — ABNORMAL LOW (ref 135–145)

## 2017-05-20 LAB — GLUCOSE, CAPILLARY
GLUCOSE-CAPILLARY: 121 mg/dL — AB (ref 65–99)
GLUCOSE-CAPILLARY: 147 mg/dL — AB (ref 65–99)
Glucose-Capillary: 234 mg/dL — ABNORMAL HIGH (ref 65–99)
Glucose-Capillary: 134 mg/dL — ABNORMAL HIGH (ref 65–99)

## 2017-05-20 SURGERY — DIALYSIS/PERMA CATHETER INSERTION
Anesthesia: Moderate Sedation

## 2017-05-20 MED ORDER — HEPARIN SODIUM (PORCINE) 1000 UNIT/ML IJ SOLN
INTRAMUSCULAR | Status: AC
  Filled 2017-05-20: qty 1

## 2017-05-20 MED ORDER — LIDOCAINE-EPINEPHRINE (PF) 2 %-1:200000 IJ SOLN
INTRAMUSCULAR | Status: AC
  Filled 2017-05-20: qty 20

## 2017-05-20 MED ORDER — FENTANYL CITRATE (PF) 100 MCG/2ML IJ SOLN
INTRAMUSCULAR | Status: AC
  Filled 2017-05-20: qty 2

## 2017-05-20 MED ORDER — MIDAZOLAM HCL 2 MG/2ML IJ SOLN
INTRAMUSCULAR | Status: AC
  Filled 2017-05-20: qty 2

## 2017-05-20 MED ORDER — CEFAZOLIN SODIUM-DEXTROSE 2-4 GM/100ML-% IV SOLN
2.0000 g | Freq: Once | INTRAVENOUS | Status: AC
  Administered 2017-05-20: 2 g via INTRAVENOUS

## 2017-05-20 MED ORDER — IOPAMIDOL (ISOVUE-300) INJECTION 61%
INTRAVENOUS | Status: DC | PRN
  Administered 2017-05-20: 25 mL via INTRAVENOUS

## 2017-05-20 MED ORDER — SODIUM CHLORIDE 0.9 % IV SOLN: INTRAVENOUS | Status: DC

## 2017-05-20 MED ORDER — MIDAZOLAM HCL 2 MG/2ML IJ SOLN
INTRAMUSCULAR | Status: DC | PRN
  Administered 2017-05-20: 2 mg via INTRAVENOUS

## 2017-05-20 MED ORDER — FENTANYL CITRATE (PF) 100 MCG/2ML IJ SOLN
INTRAMUSCULAR | Status: DC | PRN
  Administered 2017-05-20: 50 ug via INTRAVENOUS

## 2017-05-20 MED ORDER — HEPARIN SODIUM (PORCINE) 1000 UNIT/ML IJ SOLN
INTRAMUSCULAR | Status: DC | PRN
  Administered 2017-05-20: 3000 [IU] via INTRAVENOUS

## 2017-05-20 MED ORDER — MIDODRINE HCL 5 MG PO TABS
5.0000 mg | ORAL_TABLET | Freq: Three times a day (TID) | ORAL | Status: DC
  Administered 2017-05-21: 5 mg via ORAL
  Filled 2017-05-20 (×2): qty 1

## 2017-05-20 MED ORDER — SODIUM CHLORIDE 0.9 % IV BOLUS (SEPSIS)
250.0000 mL | Freq: Once | INTRAVENOUS | Status: AC
  Administered 2017-05-20: 250 mL via INTRAVENOUS

## 2017-05-20 MED ORDER — CEFAZOLIN SODIUM-DEXTROSE 1-4 GM/50ML-% IV SOLN
INTRAVENOUS | Status: AC
  Filled 2017-05-20: qty 100

## 2017-05-20 MED ORDER — CEFAZOLIN SODIUM-DEXTROSE 2-4 GM/100ML-% IV SOLN: 2.0000 g | INTRAVENOUS | Status: AC

## 2017-05-20 MED ORDER — PROMETHAZINE HCL 25 MG/ML IJ SOLN
25.0000 mg | Freq: Three times a day (TID) | INTRAMUSCULAR | Status: DC | PRN
  Administered 2017-05-20: 25 mg via INTRAVENOUS
  Filled 2017-05-20: qty 1

## 2017-05-20 SURGICAL SUPPLY — 14 items
BALLN LUTONIX DCB 5X60X130 (BALLOONS) ×3
BALLN LUTONIX DCB 7X60X130 (BALLOONS) ×3
BALLOON LUTONIX DCB 5X60X130 (BALLOONS) ×1 IMPLANT
BALLOON LUTONIX DCB 7X60X130 (BALLOONS) ×1 IMPLANT
CANNULA 5F STIFF (CANNULA) ×3 IMPLANT
CATH BEACON 5 .035 40 KMP TP (CATHETERS) ×1 IMPLANT
CATH BEACON 5 .038 40 KMP TP (CATHETERS) ×2
DEVICE PRESTO INFLATION (MISCELLANEOUS) ×3 IMPLANT
DRAPE BRACHIAL (DRAPES) ×3 IMPLANT
PACK ANGIOGRAPHY (CUSTOM PROCEDURE TRAY) ×3 IMPLANT
SHEATH BRITE TIP 6FRX5.5 (SHEATH) ×3 IMPLANT
SUT MNCRL AB 4-0 PS2 18 (SUTURE) ×3 IMPLANT
TOWEL OR 17X26 4PK STRL BLUE (TOWEL DISPOSABLE) ×3 IMPLANT
WIRE MAGIC TOR.035 180C (WIRE) ×3 IMPLANT

## 2017-05-20 NOTE — Op Note (Signed)
[facility]    OPERATIVE NOTE   PROCEDURE: 1.   Left arteriovenous fistula cannulation under ultrasound guidance in both a retrograde and antegrade fashion 2.   Left arm fistulagram including central venogram 3.   Catheter placement brachial artery with left upper extremity angiogram to evaluate arterial perfusion 4.   Percutaneous transluminal angioplasty of the perianastomotic cephalic vein stenosis with 5 mm diameter by 6 cm length Lutonix drug-coated angioplasty balloon 5.   Percutaneous transluminal angioplasty of the cephalic vein subclavian vein confluence with 7 mm diameter by 6 mm in length Lutonix drug-coated angioplasty balloon  PRE-OPERATIVE DIAGNOSIS: 1. ESRD 2. Poorly functional right brachiocephalic AVF  POST-OPERATIVE DIAGNOSIS: same as above   SURGEON: Leotis Pain, MD  ANESTHESIA: local with MCS  ESTIMATED BLOOD LOSS: 20 cc  FINDING(S): 1. Approximately 70% stenosis of the cephalic vein just beyond the anastomosis. There was an 85-90% stenosis at the cephalic vein subclavian vein confluence. A Kumpe catheter was advanced into the brachial artery beyond the anastomosis to evaluate the distal perfusion. There was evidence of some degree of steal syndrome with sluggish flow with a propensity to retrograde flow through the fistula, but the radial and ulnar arteries did appear to be patent without significant stenosis they were just small.  SPECIMEN(S):  None  CONTRAST: 25 cc  FLUORO TIME: 3.4 minutes  MODERATE CONSCIOUS SEDATION TIME: Approximately 30 minutes with 2 mg of Versed and 50 mcg of Fentanyl   INDICATIONS: [name] is a 51 y.o. female who presents with malfunctioning left brachiocephalic arteriovenous fistula.  The patient is scheduled for left arm fistulagram. There was also some concern for steal syndrome, so plan to evaluate her left arm arterial anatomy distal to the fistula was also in place. The patient is aware the risks  include but are not limited to: bleeding, infection, thrombosis of the cannulated access, and possible anaphylactic reaction to the contrast.  The patient is aware of the risks of the procedure and elects to proceed forward.  DESCRIPTION: After full informed written consent was obtained, the patient was brought back to the angiography suite and placed supine upon the angiography table.  The patient was connected to monitoring equipment. Moderate conscious sedation was administered with a face to face encounter with the patient throughout the procedure with my supervision of the RN administering medicines and monitoring the patient's vital signs and mental status throughout from the start of the procedure until the patient was taken to the recovery room. The left arm was prepped and draped in the standard fashion for a percutaneous access intervention.  Under ultrasound guidance, the left brachiocephalic arteriovenous fistula was cannulated with a micropuncture needle under direct ultrasound guidance in a retrograde fashion near the venous access site and a permanent image was performed.  The microwire was advanced into the fistula and the needle was exchanged for the a microsheath.  I then upsized to a 6 Fr Sheath and imaging was performed.  Hand injections were completed to image the access including the central venous system. Advancement of the Kumpe catheter to the brachial artery was performed to evaluate the arterial circuit distal to the fistula. This demonstrated approximately 70% stenosis of the cephalic vein just beyond the anastomosis. There was an 85-90% stenosis at the cephalic vein subclavian vein confluence. A Kumpe catheter was advanced into the brachial artery beyond the anastomosis to evaluate the distal perfusion. There was evidence of some degree of steal syndrome with sluggish flow with  a propensity to retrograde flow through the fistula, but the radial and ulnar arteries did appear to be  patent without significant stenosis they were just small..  Based on the images, this patient will need intervention to both areas. I then gave the patient 3000 units of intravenous heparin.  I then crossed the perianastomotic stenosis with a Magic Tourqe wire.  Based on the imaging, a 5 mm x 6 cm  Lutonix drug-coated angioplasty balloon was selected.  The balloon was centered around the perianastomotic stenosis and inflated to 12 ATM for 1 minute(s).  On completion imaging, a 20-25 % residual stenosis was present.   I then removed this sheath and reaccessed the fistula in an antegrade fashion. This was done a direct ultrasound guidance and a permanent image was recorded. I then crossed the stenosis at the cephalic vein subclavian vein confluence without difficulty. This lesion was then treated with a 7 mm diameter by 6 cm length Lutonix drug-coated angioplasty balloon inflated to 14 atm for 1 minute. Completion angiogram showed only about a 20% residual stenosis with brisk flow centrally.  Based on the completion imaging, no further intervention is necessary.  The wire and balloon were removed from the sheath.  A 4-0 Monocryl purse-string suture was sewn around the sheath.  The sheath was removed while tying down the suture.  A sterile bandage was applied to the puncture site.  COMPLICATIONS: None  CONDITION: Stable   Leotis Pain  05/20/2017 4:55 PM   This note was created with Dragon Medical transcription system. Any errors in dictation are purely unintentional.

## 2017-05-20 NOTE — Progress Notes (Signed)
Dr. Vianne Bulls notified of low blood pressure and received order for Midodrine 5 mg po TID.

## 2017-05-20 NOTE — Progress Notes (Signed)
Dr. Marcille Blanco notified of persistant n/v, phenergan ordered po; requested IV phenergan. Acknowledged, new order written. Syenna Nazir K, RN3:15 AM,05/20/2017

## 2017-05-20 NOTE — Progress Notes (Signed)
[facility], [place] 05/20/17  Subjective:  Patient admitted to evaluate pain over dialysis access that radiates to left shoulder and neck She states the fistula was infiltrated on Monday.  On Wednesday, she completed half of the dialysis but then had to stop early due to worsening pain.  She had swelling over the fistula.  She placed ice which has helped reduce the swelling.  She scheduled for an angiogram this afternoon.   Objective:  Vital signs in last 24 hours:  Temp:  [98.4 F (36.9 C)-99 F (37.2 C)] 98.4 F (36.9 C) (05/31 0646) Pulse Rate:  [68-110] 68 (05/31 0646) Resp:  [16-32] 18 (05/31 0646) BP: (108-213)/(53-97) 108/53 (05/31 0646) SpO2:  [96 %-100 %] 100 % (05/31 0646) Weight:  [93 kg (205 lb 0.4 oz)-94.3 kg (208 lb)] 94.3 kg (208 lb) (05/31 0500)  Weight change:  Filed Weights   05/19/17 1534 05/20/17 0500  Weight: 93 kg (205 lb 0.4 oz) 94.3 kg (208 lb)    Intake/Output:    Intake/Output Summary (Last 24 hours) at 05/20/17 1345 Last data filed at 05/20/17 1036  Gross per 24 hour  Intake                0 ml  Output              350 ml  Net             -350 ml     Physical Exam: General: No acute distress, laying in the bed  HEENT Anicteric, moist oral mucous membranes  Neck supple  Pulm/lungs Normal breathing effort, clear to auscultation  CVS/Heart Regular, no rub, 2/6 systolic murmur  Abdomen:  Soft, nontender  Extremities: No edema  Neurologic: Alert, oriented  Skin: No acute rashes  Access: Left upper arm AV fistula with some swelling       Basic Metabolic Panel:   Recent Labs Lab 05/19/17 1554 05/20/17 0413  NA 134* 134*  K 3.6 4.5  CL 96* 97*  CO2 26 27  GLUCOSE 162* 249*  BUN 24* 34*  CREATININE 3.23* 4.43*  CALCIUM 8.2* 7.5*     CBC:  Recent Labs Lab 05/19/17 1554 05/20/17 0413  WBC 7.6 8.0  NEUTROABS 5.8  --   HGB 10.7* 9.5*  HCT 31.0* 27.3*  MCV 93.8 94.8  PLT 150 126*     Lab  Results  Component Value Date   HEPBSAG Negative 01/08/2017   HEPBSAB Non Reactive 01/08/2017      Microbiology:  Recent Results (from the past 240 hour(s))  MRSA PCR Screening     Status: None   Collection Time: 05/19/17  9:36 PM  Result Value Ref Range Status   MRSA by PCR NEGATIVE NEGATIVE Final    Comment:        The GeneXpert MRSA Assay (FDA approved for NASAL specimens only), is one component of a comprehensive MRSA colonization surveillance program. It is not intended to diagnose MRSA infection nor to guide or monitor treatment for MRSA infections.     Coagulation Studies:  Recent Labs  05/19/17 1554  LABPROT 12.6  INR 0.94    Urinalysis: No results for input(s): COLORURINE, LABSPEC, PHURINE, GLUCOSEU, HGBUR, BILIRUBINUR, KETONESUR, PROTEINUR, UROBILINOGEN, NITRITE, LEUKOCYTESUR in the last 72 hours.  Invalid input(s): APPERANCEUR    Imaging: Dg Chest 2 View  Result Date: 05/19/2017 CLINICAL DATA:  51 y/o  F; shortness of breath. EXAM: CHEST  2 VIEW COMPARISON:  04/09/2017 chest radiograph  FINDINGS: The heart size and mediastinal contours are within normal limits. Both lungs are clear. Mild degenerative changes of the thoracic spine. IMPRESSION: No active cardiopulmonary disease. Electronically Signed   By: Kristine Garbe M.D.   On: 05/19/2017 17:38     Medications:    . calcium acetate  1,334 mg Oral TID WC  . diltiazem  120 mg Oral BID  . insulin aspart  0-5 Units Subcutaneous QHS  . insulin aspart  0-9 Units Subcutaneous TID WC  . lisinopril  20 mg Oral QHS   albuterol, morphine injection, ondansetron **OR** ondansetron (ZOFRAN) IV, oxyCODONE, polyethylene glycol, promethazine, traMADol  Assessment/ Plan:  51 y.o.Caucasian female with medical history of hypertension, diastolic heart failure, diabetes mellitus type 2, and anemia of chronic kidney disease, hepatitis C, depression, psoriasis, peripheral neuropathy, ESRD, history of left  foot blister/LLE cellulitis   [facility],MWF  1. End-stage renal disease on dialysis MWF.   -  HD scheduled for tomorrow  2. Anemia chronic and disease: hemoglobin 9.5 - EPO with HD  3. Secondary hyperparathyroidism with hyperphosphatemia.   - Continue home dose of binder  4.  Complication of dialysis access Angiogram planned for today.  If access is not usable, may need PermCath for a few days to weeks   LOS: 1 Jerimie Mancuso 5/31/20181:45 PM  [facility] [place], [place]

## 2017-05-20 NOTE — Progress Notes (Signed)
[place] [facility] [place] NAME: [name]    MR#:  [id number]  DATE OF BIRTH:  [birth year]  SUBJECTIVE: Admitted for pain, swelling around her fistula site with infiltration, swollen left arm. Received dialysis yesterday, patient started to notice swelling on Monday which got worse yesterday.   CHIEF COMPLAINT:   Chief Complaint  Patient presents with  . Shoulder Pain  . Neck Pain  . Problem with dialysis access    REVIEW OF SYSTEMS:    Review of Systems  Constitutional: Negative for chills and fever.  HENT: Negative for hearing loss.   Eyes: Negative for blurred vision, double vision and photophobia.  Respiratory: Negative for cough, hemoptysis and shortness of breath.   Cardiovascular: Negative for palpitations, orthopnea and leg swelling.  Gastrointestinal: Negative for abdominal pain, diarrhea and vomiting.  Genitourinary: Negative for dysuria and urgency.  Musculoskeletal: Positive for myalgias. Negative for neck pain.  Skin: Negative for rash.  Neurological: Negative for dizziness, focal weakness, seizures, weakness and headaches.  Psychiatric/Behavioral: Negative for memory loss. The patient does not have insomnia.    noted to have swelling in the left AV fistula site. Patient left hand is warm to touch, no tingling or numbness in the left arm. Left hand is warm to touch as well. Nutrition:  Tolerating Diet: Tolerating PT:      DRUG ALLERGIES:   Allergies  Allergen Reactions  . Cinnamon Anaphylaxis  . Cinoxate Anaphylaxis  . Tylenol [Acetaminophen] Anaphylaxis  . Ciprofloxacin Diarrhea  . Garlic Hives  . Onion Hives and Swelling  . Prednisone Other (See Comments)    Vaginal blisters & oral blisters    VITALS:  Blood pressure (!) 108/53, pulse 68, temperature 98.4 F (36.9 C), temperature source Oral, resp. rate 18, height 5\' 7"  (1.702 m), weight 94.3 kg (208 lb), SpO2 100 %.  PHYSICAL EXAMINATION:    Physical Exam  GENERAL:  51-y.o.-year-old patient lying in the bed with no acute distress.  EYES: Pupils equal, round, reactive to light and accommodation. No scleral icterus. Extraocular muscles intact.  HEENT: Head atraumatic, normocephalic. Oropharynx and nasopharynx clear.  NECK:  Supple, no jugular venous distention. No thyroid enlargement, no tenderness.  LUNGS: Normal breath sounds bilaterally, no wheezing, rales,rhonchi or crepitation. No use of accessory muscles of respiration.  CARDIOVASCULAR: S1, S2 normal. No murmurs, rubs, or gallops.  ABDOMEN: Soft, nontender, nondistended. Bowel sounds present. No organomegaly or mass.  EXTREMITIES: No pedal edema, cyanosis, or clubbing.  NEUROLOGIC: Cranial nerves II through XII are intact. Muscle strength 5/5 in all extremities. Sensation intact. Gait not checked.  PSYCHIATRIC: The patient is alert and oriented x 3.  SKIN: No obvious rash, lesion, or ulcer.    LABORATORY PANEL:   CBC  Recent Labs Lab 05/20/17 0413  WBC 8.0  HGB 9.5*  HCT 27.3*  PLT 126*   ------------------------------------------------------------------------------------------------------------------  Chemistries   Recent Labs Lab 05/20/17 0413  NA 134*  K 4.5  CL 97*  CO2 27  GLUCOSE 249*  BUN 34*  CREATININE 4.43*  CALCIUM 7.5*   ------------------------------------------------------------------------------------------------------------------  Cardiac Enzymes No results for input(s): TROPONINI in the last 168 hours. ------------------------------------------------------------------------------------------------------------------  RADIOLOGY:  Dg Chest 2 View  Result Date: 05/19/2017 CLINICAL DATA:  51 y/o  F; shortness of breath. EXAM: CHEST  2 VIEW COMPARISON:  04/09/2017 chest radiograph FINDINGS: The heart size and mediastinal contours are within normal limits. Both lungs are clear. Mild degenerative changes of the thoracic spine.  IMPRESSION: No active cardiopulmonary disease. Electronically Signed   By: Kristine Garbe M.D.   On: 05/19/2017 17:38     ASSESSMENT AND PLAN:   Active Problems:   Dialysis AV fistula malfunction (HCC)   #1 AV fistula malfunction with swelling, occlusion. Vascular surgery is aware and the patient the need for angiogram of AV fistula to see if it can be declotted. At this time no evidence of acute occlusion with compromise of the circulation. Also discuss with nephrology Dr. Murlean Iba n and he is aware of this. #2 ESRD on hemodialysis Monday, Wednesday, Friday. Discussed with nephrology about dialysis tomorrow, patient is fistula cannot be declotted patient and his permacath.  #3 chronic pain syndrome.   #4. diabetes mellitus type 2: Continue insulin with coverage.    All the records are reviewed and case discussed with Care Management/Social Workerr. Management plans discussed with the patient, family and they are in agreement.  CODE STATUS: full  TOTAL TIME TAKING CARE OF THIS PATIENT: 34minutes.   POSSIBLE D/C IN 1-2DAYS, DEPENDING ON CLINICAL CONDITION.   Epifanio Lesches M.D on 05/20/2017 at 9:33 AM  Between 7am to 6pm - Pager - [phone]  After 6pm go to [email or web address] - password [email or web address] [facility]  [facility]  Office  [phone]  CC: Primary care physician; Josephine Cables, MD

## 2017-05-20 NOTE — Consult Note (Signed)
[facility] Vascular Consult Note  MRN : [id number]  [name] is a 51 y.o. (02/18/1966) female who presents with chief complaint of  Chief Complaint  Patient presents with  . Shoulder Pain  . Neck Pain  . Problem with dialysis access  .  History of Present Illness: I am asked by Dr. Karma Greaser in the ED and Dr. [name] from nephrology to see the patient to assess her access. She had what sounds like a significant infiltration with dialysis yesterday and was sent to the emergency room with significant swelling in the left arm as well as some numbness and tingling in her left hand. Prior to this, she was not really having much pain in her hand or other problems. Her fistula had been her access for many months now. She denies any fever or chills. She has been having a fair bit of pain in the left arm since this episode.  Current Facility-Administered Medications  Medication Dose Route Frequency Provider Last Rate Last Dose  . 0.9 %  sodium chloride infusion   Intravenous Continuous Kamareon Sciandra, Erskine Squibb, MD      . albuterol (PROVENTIL) (2.5 MG/3ML) 0.083% nebulizer solution 2.5 mg  2.5 mg Nebulization Q2H PRN Sudini, Alveta Heimlich, MD      . calcium acetate (PHOSLO) capsule 1,334 mg  1,334 mg Oral TID WC Sudini, Srikar, MD      . diltiazem (CARDIZEM SR) 12 hr capsule 120 mg  120 mg Oral BID Hillary Bow, MD   Stopped at 05/20/17 1028  . insulin aspart (novoLOG) injection 0-5 Units  0-5 Units Subcutaneous QHS Sudini, Srikar, MD      . insulin aspart (novoLOG) injection 0-9 Units  0-9 Units Subcutaneous TID WC Hillary Bow, MD   1 Units at 05/20/17 1231  . midodrine (PROAMATINE) tablet 5 mg  5 mg Oral TID WC Epifanio Lesches, MD      . morphine 4 MG/ML injection 4 mg  4 mg Intravenous Q3H PRN Hillary Bow, MD   4 mg at 05/20/17 0020  . ondansetron (ZOFRAN) tablet 4 mg  4 mg Oral Q6H PRN Hillary Bow, MD       Or  . ondansetron (ZOFRAN) injection 4 mg  4 mg Intravenous Q6H  PRN Hillary Bow, MD   4 mg at 05/20/17 0006  . oxyCODONE (Oxy IR/ROXICODONE) immediate release tablet 10 mg  10 mg Oral Q4H PRN Hillary Bow, MD   10 mg at 05/20/17 1235  . polyethylene glycol (MIRALAX / GLYCOLAX) packet 17 g  17 g Oral Daily PRN Sudini, Alveta Heimlich, MD      . promethazine (PHENERGAN) injection 25 mg  25 mg Intravenous Q8H PRN Harrie Foreman, MD   25 mg at 05/20/17 0340  . sodium chloride 0.9 % bolus 250 mL  250 mL Intravenous Once Murlean Iba, MD      . traMADol (ULTRAM) tablet 50 mg  50 mg Oral Q6H PRN Hillary Bow, MD        Past Medical History:  Diagnosis Date  . Anemia   . Anxiety   . Arthritis   . CHF (congestive heart failure) (HCC)    history of, has been cleared.  . Chronic kidney disease   . COPD (chronic obstructive pulmonary disease) ([place])   . Depression   . Diabetes mellitus without complication ([place])   . Emphysema of lung ([place])   . End stage renal disease ([place])   . Headache    migraines  .  Hepatitis 2003   Hep C  . Hypercholesterolemia    pt denies  . Hypertension   . Peripheral vascular disease ([place])   . Pneumonia 2015  . Tobacco dependence     Past Surgical History:  Procedure Laterality Date  . AV FISTULA PLACEMENT Left 11/2014  . CHOLECYSTECTOMY    . cyst removed  from left hand Left 1989  . INCISION AND DRAINAGE ABSCESS N/A 07/16/2016   Procedure: INCISION AND DRAINAGE ABSCESS;  Surgeon: Carloyn Manner, MD;  Location: [facility] ORS;  Service: ENT;  Laterality: N/A;  . PERIPHERAL VASCULAR CATHETERIZATION N/A 07/25/2015   Procedure: A/V Shuntogram/Fistulagram;  Surgeon: Algernon Huxley, MD;  Location: [place] [facility];  Service: Cardiovascular;  Laterality: N/A;  . PERIPHERAL VASCULAR CATHETERIZATION Left 07/25/2015   Procedure: A/V Shunt Intervention;  Surgeon: Algernon Huxley, MD;  Location: [place] [facility];  Service: Cardiovascular;  Laterality: Left;  . PERIPHERAL VASCULAR CATHETERIZATION Left 10/07/2015   Procedure: A/V  Shuntogram/Fistulagram;  Surgeon: Algernon Huxley, MD;  Location: [place] [facility];  Service: Cardiovascular;  Laterality: Left;  . PERIPHERAL VASCULAR CATHETERIZATION N/A 10/07/2015   Procedure: A/V Shunt Intervention;  Surgeon: Algernon Huxley, MD;  Location: [place] [facility];  Service: Cardiovascular;  Laterality: N/A;  . PERIPHERAL VASCULAR CATHETERIZATION  10/07/2015   Procedure: Dialysis/Perma Catheter Insertion;  Surgeon: Algernon Huxley, MD;  Location: [place] [facility];  Service: Cardiovascular;;  . PERIPHERAL VASCULAR CATHETERIZATION N/A 12/17/2015   Procedure: Dialysis/Perma Catheter Removal;  Surgeon: Katha Cabal, MD;  Location: [place] [facility];  Service: Cardiovascular;  Laterality: N/A;  . PERIPHERAL VASCULAR CATHETERIZATION N/A 01/11/2017   Procedure: Visceral Angiography;  Surgeon: Algernon Huxley, MD;  Location: [place] [facility];  Service: Cardiovascular;  Laterality: N/A;  . PERIPHERAL VASCULAR CATHETERIZATION N/A 01/11/2017   Procedure: Visceral Artery Intervention;  Surgeon: Algernon Huxley, MD;  Location: [place] [facility];  Service: Cardiovascular;  Laterality: N/A;  . rt. tubal and ovary removed    . TONSILLECTOMY      Social History Social History  Substance Use Topics  . Smoking status: Current Every Day Smoker    Packs/day: 0.25    Years: 17.00    Types: Cigarettes  . Smokeless tobacco: Never Used  . Alcohol use No    Family History Family History  Problem Relation Age of Onset  . Hypertension Mother   . Heart disease Mother   . Hypertension Father     Allergies  Allergen Reactions  . Cinnamon Anaphylaxis  . Cinoxate Anaphylaxis  . Tylenol [Acetaminophen] Anaphylaxis  . Ciprofloxacin Diarrhea  . Garlic Hives  . Onion Hives and Swelling  . Prednisone Other (See Comments)    Vaginal blisters & oral blisters     REVIEW OF SYSTEMS (Negative unless checked)  Constitutional: [] Weight loss  [] Fever  [] Chills Cardiac: [x] Chest pain   [x] Chest  pressure   [] Palpitations   [] Shortness of breath when laying flat   [] Shortness of breath at rest   [] Shortness of breath with exertion. Vascular:  [] Pain in legs with walking   [] Pain in legs at rest   [] Pain in legs when laying flat   [] Claudication   [] Pain in feet when walking  [] Pain in feet at rest  [] Pain in feet when laying flat   [] History of DVT   [] Phlebitis   [] Swelling in legs   [] Varicose veins   [] Non-healing ulcers Pulmonary:   [] Uses home oxygen   []   Productive cough   [] Hemoptysis   [] Wheeze  [] COPD   [] Asthma Neurologic:  [] Dizziness  [] Blackouts   [] Seizures   [] History of stroke   [] History of TIA  [] Aphasia   [] Temporary blindness   [] Dysphagia   [] Weakness or numbness in arms   [] Weakness or numbness in legs Musculoskeletal:  [] Arthritis   [] Joint swelling   [] Joint pain   [] Low back pain Hematologic:  [x] Easy bruising  [] Easy bleeding   [] Hypercoagulable state   [] Anemic  [] Hepatitis Gastrointestinal:  [] Blood in stool   [] Vomiting blood  [] Gastroesophageal reflux/heartburn   [] Difficulty swallowing. Genitourinary:  [x] Chronic kidney disease   [] Difficult urination  [] Frequent urination  [] Burning with urination   [] Blood in urine Skin:  [] Rashes   [] Ulcers   [] Wounds Psychological:  [] History of anxiety   []  History of major depression.  Physical Examination  Vitals:   05/19/17 2137 05/20/17 0500 05/20/17 0646 05/20/17 1405  BP: (!) 147/64  (!) 108/53 (!) 79/45  Pulse: (!) 108  68 65  Resp:   18 18  Temp:   98.4 F (36.9 C) 98 F (36.7 C)  TempSrc:   Oral Oral  SpO2:   100% 99%  Weight:  94.3 kg (208 lb)    Height:       Body mass index is 32.58 kg/m. Gen:  WD/WN, NAD Head: [place]/AT, No temporalis wasting. Prominent temp pulse not noted. Ear/Nose/Throat: Hearing grossly intact, nares w/o erythema or drainage, oropharynx w/o Erythema/Exudate Eyes: Sclera non-icteric, conjunctiva clear Neck: Trachea midline.  No JVD.  Pulmonary:  Good air movement, respirations  not labored, equal bilaterally.  Cardiac: RRR, normal S1, S2. Vascular: thrill in AVF but moderate bruising is present around area with moderate bruising Vessel Right Left  Radial Palpable Palpable                                   Gastrointestinal: soft, non-tender/non-distended.  Musculoskeletal: M/S 5/5 throughout.  Extremities without ischemic changes.  No deformity or atrophy. No edema. Neurologic: Sensation grossly intact in extremities.  Symmetrical.  Speech is fluent. Motor exam as listed above. Psychiatric: Judgment intact, Mood & affect appropriate for pt's clinical situation. Dermatologic: No rashes or ulcers noted.  No cellulitis or open wounds.       CBC Lab Results  Component Value Date   WBC 8.0 05/20/2017   HGB 9.5 (L) 05/20/2017   HCT 27.3 (L) 05/20/2017   MCV 94.8 05/20/2017   PLT 126 (L) 05/20/2017    BMET    Component Value Date/Time   NA 134 (L) 05/20/2017 0413   NA 133 (L) 10/31/2014 0955   K 4.5 05/20/2017 0413   K 4.7 10/31/2014 0955   CL 97 (L) 05/20/2017 0413   CL 102 10/31/2014 0955   CO2 27 05/20/2017 0413   CO2 27 10/31/2014 0955   GLUCOSE 249 (H) 05/20/2017 0413   GLUCOSE 203 (H) 10/31/2014 0955   BUN 34 (H) 05/20/2017 0413   BUN 35 (H) 10/31/2014 0955   CREATININE 4.43 (H) 05/20/2017 0413   CREATININE 2.80 (H) 10/31/2014 0955   CALCIUM 7.5 (L) 05/20/2017 0413   CALCIUM 7.7 (L) 10/31/2014 0955   GFRNONAA 11 (L) 05/20/2017 0413   GFRNONAA 20 (L) 10/29/2014 0429   GFRNONAA 34 (L) 06/02/2013 1405   GFRAA 12 (L) 05/20/2017 0413   GFRAA 24 (L) 10/29/2014 0429   GFRAA 39 (L) 06/02/2013 1405  Estimated Creatinine Clearance: 17.5 mL/min (A) (by C-G formula based on SCr of 4.43 mg/dL (H)).  COAG Lab Results  Component Value Date   INR 0.94 05/19/2017   INR 1.04 12/16/2016   INR 1.1 10/15/2014    Radiology Dg Chest 2 View  Result Date: 05/19/2017 CLINICAL DATA:  51 y/o  F; shortness of breath. EXAM: CHEST  2 VIEW  COMPARISON:  04/09/2017 chest radiograph FINDINGS: The heart size and mediastinal contours are within normal limits. Both lungs are clear. Mild degenerative changes of the thoracic spine. IMPRESSION: No active cardiopulmonary disease. Electronically Signed   By: Kristine Garbe M.D.   On: 05/19/2017 17:38      Assessment/Plan 1. End-stage renal disease with infiltration of left arm access. I think a fistulogram would be prudent to see if there is any correctable problem with the fistula and to evaluate usability. Her hand has changes consistent with some steal syndrome that is only mildly symptomatic. We can do an angiogram at the time of a fistulogram to evaluate her left upper extremity arterial perfusion is well. If her fistula is not going to be usable, a PermCath will need to be placed and that would also be planned. 2. Diabetes. Suboptimal control and blood glucose control important in reducing the progression of atherosclerotic disease. Also, involved in wound healing. On appropriate medications. 3. HTN. Stable.  BP low today.  On outpatient medication.   Leotis Pain, MD  05/20/2017 3:30 PM    This note was created with Dragon medical transcription system.  Any error is purely unintentional

## 2017-05-21 LAB — RENAL FUNCTION PANEL
Albumin: 3 g/dL — ABNORMAL LOW (ref 3.5–5.0)
Anion gap: 14 (ref 5–15)
BUN: 55 mg/dL — ABNORMAL HIGH (ref 6–20)
CALCIUM: 7.2 mg/dL — AB (ref 8.9–10.3)
CO2: 25 mmol/L (ref 22–32)
CREATININE: 6.28 mg/dL — AB (ref 0.44–1.00)
Chloride: 91 mmol/L — ABNORMAL LOW (ref 101–111)
GFR, EST AFRICAN AMERICAN: 8 mL/min — AB (ref >60)
GFR, EST NON AFRICAN AMERICAN: 7 mL/min — AB (ref >60)
Glucose, Bld: 138 mg/dL — ABNORMAL HIGH (ref 65–99)
Phosphorus: 7.6 mg/dL — ABNORMAL HIGH (ref 2.5–4.6)
Potassium: 4.7 mmol/L (ref 3.5–5.1)
SODIUM: 130 mmol/L — AB (ref 135–145)

## 2017-05-21 LAB — CBC
HCT: 25.8 % — ABNORMAL LOW (ref 35.0–47.0)
Hemoglobin: 9.1 g/dL — ABNORMAL LOW (ref 12.0–16.0)
MCH: 33.2 pg (ref 26.0–34.0)
MCHC: 35.2 g/dL (ref 32.0–36.0)
MCV: 94.2 fL (ref 80.0–100.0)
PLATELETS: 134 10*3/uL — AB (ref 150–440)
RBC: 2.73 MIL/uL — ABNORMAL LOW (ref 3.80–5.20)
RDW: 13.4 % (ref 11.5–14.5)
WBC: 8.8 10*3/uL (ref 3.6–11.0)

## 2017-05-21 LAB — PHOSPHORUS: PHOSPHORUS: 8.1 mg/dL — AB (ref 2.5–4.6)

## 2017-05-21 LAB — GLUCOSE, CAPILLARY
GLUCOSE-CAPILLARY: 119 mg/dL — AB (ref 65–99)
Glucose-Capillary: 121 mg/dL — ABNORMAL HIGH (ref 65–99)

## 2017-05-21 MED ORDER — MIDODRINE HCL 5 MG PO TABS: 5.0000 mg | ORAL_TABLET | Freq: Three times a day (TID) | ORAL | 0 refills | Status: DC

## 2017-05-21 MED ORDER — INSULIN ASPART 100 UNIT/ML ~~LOC~~ SOLN: 0.0000 [IU] | Freq: Three times a day (TID) | SUBCUTANEOUS | 11 refills | Status: DC

## 2017-05-21 MED ORDER — OXYCODONE HCL 10 MG PO TABS: 10.0000 mg | ORAL_TABLET | Freq: Four times a day (QID) | ORAL | 0 refills | Status: DC | PRN

## 2017-05-21 MED ORDER — EPOETIN ALFA 10000 UNIT/ML IJ SOLN: 4000.0000 [IU] | Freq: Once | INTRAMUSCULAR | Status: DC

## 2017-05-21 NOTE — Care Management (Signed)
[facility] HD liaison notified of discharge

## 2017-05-21 NOTE — Progress Notes (Signed)
POST DIALYSIS ASSESSMENT 

## 2017-05-21 NOTE — Progress Notes (Signed)
HD COMPLETED  

## 2017-05-21 NOTE — Progress Notes (Signed)
Pre dialysis assessment 

## 2017-05-21 NOTE — Progress Notes (Signed)
05/21/2017  5:55 PM  [name] to be D/C'd Home per MD order.  Discussed prescriptions and follow up appointments with the patient. Prescriptions given to patient, medication list explained in detail. Pt verbalized understanding.  Allergies as of 05/21/2017      Reactions   Cinnamon Anaphylaxis   Cinoxate Anaphylaxis   Tylenol [acetaminophen] Anaphylaxis   Ciprofloxacin Diarrhea   Garlic Hives   Onion Hives, Swelling   Prednisone Other (See Comments)   Vaginal blisters & oral blisters      Medication List    STOP taking these medications   diltiazem 120 MG 12 hr capsule Commonly known as:  CARDIZEM SR     TAKE these medications   b complex-vitamin c-folic acid 0.8 MG Tabs tablet Take 1 tablet by mouth daily. On dialysis days takes in evening and on non-dialysis days morning   calcium acetate 667 MG capsule Commonly known as:  PHOSLO Take 1,334 mg by mouth 3 (three) times daily with meals. Notes to patient:  Last administered with breakfast 05/21/17   clobetasol ointment 0.05 % Commonly known as:  TEMOVATE Apply 1 application topically daily as needed (for psorasis).   insulin aspart 100 UNIT/ML injection Commonly known as:  novoLOG Inject 0-9 Units into the skin 3 (three) times daily with meals. Notes to patient:  Last administered 5/31 afternoon   lisinopril 20 MG tablet Commonly known as:  PRINIVIL,ZESTRIL Take 20 mg by mouth at bedtime. Notes to patient:  Last administered bedtime 5/30   Oxycodone HCl 10 MG Tabs Take 1 tablet (10 mg total) by mouth every 6 (six) hours as needed for moderate pain.   promethazine 25 MG tablet Commonly known as:  PHENERGAN Take 1 tablet (25 mg total) by mouth every 4 (four) hours as needed for nausea or vomiting.       Vitals:   05/21/17 1445 05/21/17 1700  BP: (!) 144/56 (!) 103/58  Pulse: 88 75  Resp: (!) 9   Temp:      Skin clean, dry and intact without evidence of skin break down, no evidence of skin tears noted. IV  catheter discontinued intact. Site without signs and symptoms of complications. Dressing and pressure applied. Pt denies pain at this time. No complaints noted.  An After Visit Summary was printed and given to the patient. Patient escorted via [place], and D/C home via private auto.  Dola Argyle

## 2017-05-21 NOTE — Progress Notes (Signed)
HD STARTED  

## 2017-05-21 NOTE — Discharge Summary (Addendum)
[name], is a 51 y.o. female  DOB 1966/10/27  MRN [id number].  Admission date:  05/19/2017  Admitting Physician  Hillary Bow, MD  Discharge Date:  05/21/2017   Primary MD  Josephine Cables, MD  Recommendations for primary care physician for things to follow:   Follow-up with primary doctor in 1 week Follow with the  nephrology as routine for hemodialysis Monday, Wednesday, Friday.   Admission Diagnosis  SOB (shortness of breath) [R06.02] ESRD (end stage renal disease) on dialysis ([place]) [N18.6, Z99.2] Insulin dependent diabetes mellitus (HCC) [E11.9, Z79.4] Steal syndrome as complication of dialysis access, initial encounter ([place]) [T82.898A] Problem with dialysis access, initial encounter [facility]) [T82.898A] Chronic obstructive pulmonary disease, unspecified COPD type ([place]) [J44.9]   Discharge Diagnosis  SOB (shortness of breath) [R06.02] ESRD (end stage renal disease) on dialysis ([place]) [N18.6, Z99.2] Insulin dependent diabetes mellitus ([place]) [E11.9, Z79.4] Steal syndrome as complication of dialysis access, initial encounter ([place]) [T82.898A] Problem with dialysis access, initial encounter ([place]) [T82.898A] Chronic obstructive pulmonary disease, unspecified COPD type ([place]) [J44.9]    Active Problems:   Dialysis AV fistula malfunction ([place])      Past Medical History:  Diagnosis Date  . Anemia   . Anxiety   . Arthritis   . CHF (congestive heart failure) (HCC)    history of, has been cleared.  . Chronic kidney disease   . COPD (chronic obstructive pulmonary disease) ([place])   . Depression   . Diabetes mellitus without complication ([place])   . Emphysema of lung ([place])   . End stage renal disease ([place])   . Headache    migraines  . Hepatitis 2003   Hep C  . Hypercholesterolemia    pt denies  . Hypertension   .  Peripheral vascular disease ([place])   . Pneumonia 2015  . Tobacco dependence     Past Surgical History:  Procedure Laterality Date  . AV FISTULA PLACEMENT Left 11/2014  . CHOLECYSTECTOMY    . cyst removed  from left hand Left 1989  . DIALYSIS/PERMA CATHETER INSERTION N/A 05/20/2017   Procedure: Dialysis/Perma Catheter Insertion and fistulagram/LUE angiogram;  Surgeon: Algernon Huxley, MD;  Location: [place] [facility];  Service: Cardiovascular;  Laterality: N/A;  . INCISION AND DRAINAGE ABSCESS N/A 07/16/2016   Procedure: INCISION AND DRAINAGE ABSCESS;  Surgeon: Carloyn Manner, MD;  Location: [facility];  Service: ENT;  Laterality: N/A;  . PERIPHERAL VASCULAR CATHETERIZATION N/A 07/25/2015   Procedure: A/V Shuntogram/Fistulagram;  Surgeon: Algernon Huxley, MD;  Location: [place] [facility];  Service: Cardiovascular;  Laterality: N/A;  . PERIPHERAL VASCULAR CATHETERIZATION Left 07/25/2015   Procedure: A/V Shunt Intervention;  Surgeon: Algernon Huxley, MD;  Location: [place] [facility];  Service: Cardiovascular;  Laterality: Left;  . PERIPHERAL VASCULAR CATHETERIZATION Left 10/07/2015   Procedure: A/V Shuntogram/Fistulagram;  Surgeon: Algernon Huxley, MD;  Location: [place] [facility];  Service: Cardiovascular;  Laterality: Left;  . PERIPHERAL VASCULAR CATHETERIZATION N/A 10/07/2015   Procedure: A/V Shunt Intervention;  Surgeon: Algernon Huxley, MD;  Location: [place] [facility];  Service: Cardiovascular;  Laterality: N/A;  . PERIPHERAL VASCULAR CATHETERIZATION  10/07/2015   Procedure: Dialysis/Perma Catheter Insertion;  Surgeon: Algernon Huxley, MD;  Location: [place] [facility];  Service: Cardiovascular;;  . PERIPHERAL VASCULAR CATHETERIZATION N/A 12/17/2015   Procedure: Dialysis/Perma Catheter Removal;  Surgeon: Katha Cabal, MD;  Location: [place] [facility];  Service: Cardiovascular;  Laterality: N/A;  . PERIPHERAL VASCULAR CATHETERIZATION  N/A 01/11/2017   Procedure: Visceral Angiography;  Surgeon:  Algernon Huxley, MD;  Location: [place] [facility];  Service: Cardiovascular;  Laterality: N/A;  . PERIPHERAL VASCULAR CATHETERIZATION N/A 01/11/2017   Procedure: Visceral Artery Intervention;  Surgeon: Algernon Huxley, MD;  Location: [place] [facility];  Service: Cardiovascular;  Laterality: N/A;  . rt. tubal and ovary removed    . TONSILLECTOMY         History of present illness and  Hospital Course:     Kindly see H&P for history of present illness and admission details, please review complete Labs, Consult reports and Test reports for all details in brief  HPI  from the history and physical done on the day of admission 51 year old female patient with ESRD on hemodialysis Monday, Wednesday, Friday admitted on May 30 because of swelling and pain at left arm AV fistula site. Patient noted to have swelling the day before but it got worse.   Hospital Course  Malfunctioning of AV fistula of the left arm. Admitted to medical service,seen  vascular surgery and also nephrology. 4 infiltration of left AV access patient had fistulogram, angioplasty was done by vascular. Patient had some evidence of steal syndrome. Did have dialysis today through t AV fistula and it's working well. Discussed with nephrology and also patient, discharge the patient after dialysis today.  2 hypertension due to ESRD: Started on midodrine, stop Cardizem blood pressure better today, follow up with nephrology for further change in medicines. #3 chronic pain syndrome. Gave limited supply of oxycodone because of her left arm swelling and acute problem with AV fistula. #4 diabetes mellitus type 2; patient on sliding scale with insulin, With Novolin R  5 to 10 units 3 times a day with meals.   Discharge Condition: Able   Follow UP      Discharge Instructions  and  Discharge Medications      Allergies as of 05/21/2017      Reactions   Cinnamon Anaphylaxis   Cinoxate Anaphylaxis   Tylenol [acetaminophen] Anaphylaxis    Ciprofloxacin Diarrhea   Garlic Hives   Onion Hives, Swelling   Prednisone Other (See Comments)   Vaginal blisters & oral blisters      Medication List    STOP taking these medications   diltiazem 120 MG 12 hr capsule Commonly known as:  CARDIZEM SR     TAKE these medications   b complex-vitamin c-folic acid 0.8 MG Tabs tablet Take 1 tablet by mouth daily. On dialysis days takes in evening and on non-dialysis days morning   calcium acetate 667 MG capsule Commonly known as:  PHOSLO Take 1,334 mg by mouth 3 (three) times daily with meals. Notes to patient:  Last administered with breakfast 05/21/17   clobetasol ointment 0.05 % Commonly known as:  TEMOVATE Apply 1 application topically daily as needed (for psorasis).   insulin aspart 100 UNIT/ML injection Commonly known as:  novoLOG Inject 0-9 Units into the skin 3 (three) times daily with meals. Notes to patient:  Last administered 5/31 afternoon   lisinopril 20 MG tablet Commonly known as:  PRINIVIL,ZESTRIL Take 20 mg by mouth at bedtime. Notes to patient:  Last administered bedtime 5/30   Oxycodone HCl 10 MG Tabs Take 1 tablet (10 mg total) by mouth every 6 (six) hours as needed for moderate pain.   promethazine 25 MG tablet Commonly known as:  PHENERGAN Take 1 tablet (25 mg total) by mouth every 4 (four) hours as needed  for nausea or vomiting.         Diet and Activity recommendation: See Discharge Instructions above   Consults obtained - Nephrology, vascular.   Major procedures and Radiology Reports - PLEASE review detailed and final reports for all details, in brief -     Dg Chest 2 View  Result Date: 05/19/2017 CLINICAL DATA:  51 y/o  F; shortness of breath. EXAM: CHEST  2 VIEW COMPARISON:  04/09/2017 chest radiograph FINDINGS: The heart size and mediastinal contours are within normal limits. Both lungs are clear. Mild degenerative changes of the thoracic spine. IMPRESSION: No active cardiopulmonary  disease. Electronically Signed   By: Kristine Garbe M.D.   On: 05/19/2017 17:38    Micro Results    Recent Results (from the past 240 hour(s))  MRSA PCR Screening     Status: None   Collection Time: 05/19/17  9:36 PM  Result Value Ref Range Status   MRSA by PCR NEGATIVE NEGATIVE Final    Comment:        The GeneXpert MRSA Assay (FDA approved for NASAL specimens only), is one component of a comprehensive MRSA colonization surveillance program. It is not intended to diagnose MRSA infection nor to guide or monitor treatment for MRSA infections.        Today   Subjective:   Antonieta Loveless today has no headache,no chest abdominal pain,no new weakness tingling or numbness, feels much better wants to go home today.   Objective:   Blood pressure (!) 144/56, pulse 88, temperature 98.9 F (37.2 C), temperature source Oral, resp. rate (!) 9, height 5\' 7"  (1.702 m), weight 94.4 kg (208 lb 1.8 oz), SpO2 95 %.   Intake/Output Summary (Last 24 hours) at 05/21/17 1553 Last data filed at 05/21/17 1440  Gross per 24 hour  Intake              480 ml  Output             1300 ml  Net             -820 ml    Exam Awake Alert, Oriented x 3, No new F.N deficits, Normal affect [place].AT,PERRAL Supple Neck,No JVD, No cervical lymphadenopathy appriciated.  Symmetrical Chest wall movement, Good air movement bilaterally, CTAB RRR,No Gallops,Rubs or new Murmurs, No Parasternal Heave +ve B.Sounds, Abd Soft, Non tender, No organomegaly appriciated, No rebound -guarding or rigidity. No Cyanosis, Clubbing or edema, No new Rash or bruise  Data Review   CBC w Diff:  Lab Results  Component Value Date   WBC 8.8 05/21/2017   HGB 9.1 (L) 05/21/2017   HGB 7.6 (L) 10/29/2014   HCT 25.8 (L) 05/21/2017   HCT 23.0 (L) 10/29/2014   PLT 134 (L) 05/21/2017   PLT 134 (L) 10/29/2014   LYMPHOPCT 16 05/19/2017   LYMPHOPCT 11.0 10/29/2014   MONOPCT 6 05/19/2017   MONOPCT 10.1 10/29/2014   EOSPCT  1 05/19/2017   EOSPCT 1.9 10/29/2014   BASOPCT 1 05/19/2017   BASOPCT 0.6 10/29/2014    CMP:  Lab Results  Component Value Date   NA 130 (L) 05/21/2017   NA 133 (L) 10/31/2014   K 4.7 05/21/2017   K 4.7 10/31/2014   CL 91 (L) 05/21/2017   CL 102 10/31/2014   CO2 25 05/21/2017   CO2 27 10/31/2014   BUN 55 (H) 05/21/2017   BUN 35 (H) 10/31/2014   CREATININE 6.28 (H) 05/21/2017   CREATININE 2.80 (H) 10/31/2014  PROT 9.0 (H) 04/09/2017   PROT 7.4 10/15/2014   ALBUMIN 3.0 (L) 05/21/2017   ALBUMIN 1.8 (L) 10/29/2014   BILITOT 0.8 04/09/2017   BILITOT 0.2 10/15/2014   ALKPHOS 59 04/09/2017   ALKPHOS 60 10/15/2014   AST 34 04/09/2017   AST 46 (H) 10/15/2014   ALT 39 04/09/2017   ALT 54 10/15/2014  .   Total Time in preparing paper work, data evaluation and todays exam - 38 minutes  Kaizley Aja M.D on 05/21/2017 at 3:53 PM    Note: This dictation was prepared with Dragon dictation along with smaller phrase technology. Any transcriptional errors that result from this process are unintentional.

## 2017-05-21 NOTE — Progress Notes (Signed)
[facility], [place] 05/21/17  Subjective:  Patient underwent left upper arm fistulogram and angioplasty Still reports pain over the shoulder and neck although improved   HEMODIALYSIS FLOWSHEET:  Blood Flow Rate (mL/min): 300 mL/min Arterial Pressure (mmHg): -60 mmHg Venous Pressure (mmHg): 210 mmHg Transmembrane Pressure (mmHg): 60 mmHg Ultrafiltration Rate (mL/min): 430 mL/min Dialysate Flow Rate (mL/min): 600 ml/min Conductivity: Machine : 13.8 Conductivity: Machine : 13.8 Dialysis Fluid Bolus: Normal Saline Bolus Amount (mL): 250 mL     Objective:  Vital signs in last 24 hours:  Temp:  [98 F (36.7 C)-100 F (37.8 C)] 99.4 F (37.4 C) (06/01 1110) Pulse Rate:  [65-85] 79 (06/01 1300) Resp:  [10-26] 12 (06/01 1300) BP: (79-146)/(45-90) 137/54 (06/01 1300) SpO2:  [33 %-100 %] 95 % (06/01 1115) Weight:  [93.7 kg (206 lb 9.6 oz)-95.2 kg (209 lb 14.1 oz)] 95.2 kg (209 lb 14.1 oz) (06/01 1110)  Weight change: 1.348 kg (2 lb 15.6 oz) Filed Weights   05/20/17 1542 05/21/17 0500 05/21/17 1110  Weight: 94.3 kg (208 lb) 93.7 kg (206 lb 9.6 oz) 95.2 kg (209 lb 14.1 oz)    Intake/Output:    Intake/Output Summary (Last 24 hours) at 05/21/17 1314 Last data filed at 05/21/17 0900  Gross per 24 hour  Intake              480 ml  Output              300 ml  Net              180 ml     Physical Exam: General: No acute distress, laying in the bed  HEENT Anicteric, moist oral mucous membranes  Neck supple  Pulm/lungs Normal breathing effort, clear to auscultation  CVS/Heart Regular, no rub, 2/6 systolic murmur  Abdomen:  Soft, nontender  Extremities: No edema  Neurologic: Alert, oriented  Skin: No acute rashes  Access: Left upper arm AV fistula with some swelling       Basic Metabolic Panel:   Recent Labs Lab 05/19/17 1554 05/20/17 0413 05/21/17 1120  NA 134* 134* 130*  K 3.6 4.5 4.7  CL 96* 97* 91*  CO2 26 27 25   GLUCOSE 162*  249* 138*  BUN 24* 34* 55*  CREATININE 3.23* 4.43* 6.28*  CALCIUM 8.2* 7.5* 7.2*  PHOS  --   --  7.6*  8.1*     CBC:  Recent Labs Lab 05/19/17 1554 05/20/17 0413 05/21/17 1120  WBC 7.6 8.0 8.8  NEUTROABS 5.8  --   --   HGB 10.7* 9.5* 9.1*  HCT 31.0* 27.3* 25.8*  MCV 93.8 94.8 94.2  PLT 150 126* 134*      Lab Results  Component Value Date   HEPBSAG Negative 01/08/2017   HEPBSAB Non Reactive 01/08/2017      Microbiology:  Recent Results (from the past 240 hour(s))  MRSA PCR Screening     Status: None   Collection Time: 05/19/17  9:36 PM  Result Value Ref Range Status   MRSA by PCR NEGATIVE NEGATIVE Final    Comment:        The GeneXpert MRSA Assay (FDA approved for NASAL specimens only), is one component of a comprehensive MRSA colonization surveillance program. It is not intended to diagnose MRSA infection nor to guide or monitor treatment for MRSA infections.     Coagulation Studies:  Recent Labs  05/19/17 1554  LABPROT 12.6  INR 0.94    Urinalysis:  No results for input(s): COLORURINE, LABSPEC, [place], GLUCOSEU, HGBUR, BILIRUBINUR, KETONESUR, PROTEINUR, UROBILINOGEN, NITRITE, LEUKOCYTESUR in the last 72 hours.  Invalid input(s): APPERANCEUR    Imaging: Dg Chest 2 View  Result Date: 05/19/2017 CLINICAL DATA:  51 y/o  F; shortness of breath. EXAM: CHEST  2 VIEW COMPARISON:  04/09/2017 chest radiograph FINDINGS: The heart size and mediastinal contours are within normal limits. Both lungs are clear. Mild degenerative changes of the thoracic spine. IMPRESSION: No active cardiopulmonary disease. Electronically Signed   By: Kristine Garbe M.D.   On: 05/19/2017 17:38     Medications:    . calcium acetate  1,334 mg Oral TID WC  . diltiazem  120 mg Oral BID  . insulin aspart  0-5 Units Subcutaneous QHS  . insulin aspart  0-9 Units Subcutaneous TID WC  . midodrine  5 mg Oral TID WC   albuterol, morphine injection, ondansetron **OR**  ondansetron (ZOFRAN) IV, oxyCODONE, polyethylene glycol, promethazine, traMADol  Assessment/ Plan:  51 y.o.Caucasian female with medical history of hypertension, diastolic heart failure, diabetes mellitus type 2, and anemia of chronic kidney disease, hepatitis C, depression, psoriasis, peripheral neuropathy, ESRD, history of left foot blister/LLE cellulitis   [facility],MWF  1. End-stage renal disease on dialysis MWF.   -  Patient seen during dialysis Tolerating well   2. Anemia chronic and disease: hemoglobin 9.1 - EPO with HD  3. Secondary hyperparathyroidism with hyperphosphatemia.   - Continue home dose of binder  4.  Complication of dialysis access Angioplasty done on 5/31. Access working well now   LOS: 2 [facility] 6/1/20181:14 PM  [place], [place]

## 2017-05-21 NOTE — Care Management Important Message (Signed)
Important Message  Patient Details  Name: [name] MRN: [id number] Date of Birth: 04-12-1966   Medicare Important Message Given:  Yes    Beverly Sessions, RN 05/21/2017, 3:12 PM

## 2017-05-24 ENCOUNTER — Emergency Department: Payer: Medicare Other

## 2017-05-24 ENCOUNTER — Emergency Department
Admission: EM | Admit: 2017-05-24 | Discharge: 2017-05-24 | Disposition: A | Discharge status: Home / Self Care | Payer: Medicare Other | Attending: Student in an Organized Health Care Education/Training Program | Admitting: Student in an Organized Health Care Education/Training Program

## 2017-05-24 ENCOUNTER — Emergency Department (HOSPITAL_BASED_OUTPATIENT_CLINIC_OR_DEPARTMENT_OTHER)
Admit: 2017-05-24 | Discharge: 2017-05-24 | Disposition: A | Discharge status: Home / Self Care | Payer: Medicare Other | Attending: Student in an Organized Health Care Education/Training Program | Admitting: Student in an Organized Health Care Education/Training Program

## 2017-05-24 DIAGNOSIS — I120 Hypertensive chronic kidney disease with stage 5 chronic kidney disease or end stage renal disease: Secondary | ICD-10-CM | POA: Diagnosis not present

## 2017-05-24 DIAGNOSIS — F17210 Nicotine dependence, cigarettes, uncomplicated: Secondary | ICD-10-CM | POA: Insufficient documentation

## 2017-05-24 DIAGNOSIS — I313 Pericardial effusion (noninflammatory): Secondary | ICD-10-CM

## 2017-05-24 DIAGNOSIS — R0789 Other chest pain: Secondary | ICD-10-CM | POA: Insufficient documentation

## 2017-05-24 DIAGNOSIS — Z794 Long term (current) use of insulin: Secondary | ICD-10-CM | POA: Diagnosis not present

## 2017-05-24 DIAGNOSIS — I340 Nonrheumatic mitral (valve) insufficiency: Secondary | ICD-10-CM

## 2017-05-24 DIAGNOSIS — J449 Chronic obstructive pulmonary disease, unspecified: Secondary | ICD-10-CM | POA: Diagnosis not present

## 2017-05-24 DIAGNOSIS — J90 Pleural effusion, not elsewhere classified: Secondary | ICD-10-CM | POA: Diagnosis not present

## 2017-05-24 DIAGNOSIS — N186 End stage renal disease: Secondary | ICD-10-CM | POA: Diagnosis not present

## 2017-05-24 DIAGNOSIS — I132 Hypertensive heart and chronic kidney disease with heart failure and with stage 5 chronic kidney disease, or end stage renal disease: Secondary | ICD-10-CM | POA: Insufficient documentation

## 2017-05-24 DIAGNOSIS — R079 Chest pain, unspecified: Secondary | ICD-10-CM

## 2017-05-24 DIAGNOSIS — R0602 Shortness of breath: Secondary | ICD-10-CM | POA: Diagnosis present

## 2017-05-24 DIAGNOSIS — E11319 Type 2 diabetes mellitus with unspecified diabetic retinopathy without macular edema: Secondary | ICD-10-CM | POA: Insufficient documentation

## 2017-05-24 DIAGNOSIS — Z992 Dependence on renal dialysis: Secondary | ICD-10-CM | POA: Diagnosis not present

## 2017-05-24 DIAGNOSIS — I509 Heart failure, unspecified: Secondary | ICD-10-CM | POA: Insufficient documentation

## 2017-05-24 LAB — HEPATIC FUNCTION PANEL
ALBUMIN: 3.3 g/dL — AB (ref 3.5–5.0)
ALK PHOS: 76 U/L (ref 38–126)
ALT: 9 U/L — ABNORMAL LOW (ref 14–54)
AST: 26 U/L (ref 15–41)
Total Bilirubin: 0.4 mg/dL (ref 0.3–1.2)
Total Protein: 8 g/dL (ref 6.5–8.1)

## 2017-05-24 LAB — CBC
HEMATOCRIT: 27.5 % — AB (ref 35.0–47.0)
Hemoglobin: 9.7 g/dL — ABNORMAL LOW (ref 12.0–16.0)
MCH: 33.3 pg (ref 26.0–34.0)
MCHC: 35.2 g/dL (ref 32.0–36.0)
MCV: 94.8 fL (ref 80.0–100.0)
Platelets: 149 10*3/uL — ABNORMAL LOW (ref 150–440)
RBC: 2.9 MIL/uL — AB (ref 3.80–5.20)
RDW: 13.3 % (ref 11.5–14.5)
WBC: 6.3 10*3/uL (ref 3.6–11.0)

## 2017-05-24 LAB — BASIC METABOLIC PANEL
Anion gap: 14 (ref 5–15)
BUN: 65 mg/dL — AB (ref 6–20)
CHLORIDE: 97 mmol/L — AB (ref 101–111)
CO2: 26 mmol/L (ref 22–32)
CREATININE: 7.41 mg/dL — AB (ref 0.44–1.00)
Calcium: 7 mg/dL — ABNORMAL LOW (ref 8.9–10.3)
GFR calc Af Amer: 7 mL/min — ABNORMAL LOW (ref >60)
GFR calc non Af Amer: 6 mL/min — ABNORMAL LOW (ref >60)
Glucose, Bld: 304 mg/dL — ABNORMAL HIGH (ref 65–99)
POTASSIUM: 4.4 mmol/L (ref 3.5–5.1)
SODIUM: 137 mmol/L (ref 135–145)

## 2017-05-24 LAB — TROPONIN I
Troponin I: <0.03 ng/mL (ref <0.03)
Troponin I: <0.03 ng/mL (ref <0.03)

## 2017-05-24 LAB — ECHOCARDIOGRAM COMPLETE
HEIGHTINCHES: 67 in
Weight: 3328 oz

## 2017-05-24 MED ORDER — LORAZEPAM 1 MG PO TABS
1.0000 mg | ORAL_TABLET | Freq: Once | ORAL | Status: AC
Start: 2017-05-24 — End: 2017-05-24
  Administered 2017-05-24: 1 mg via ORAL
  Filled 2017-05-24: qty 1

## 2017-05-24 MED ORDER — FENTANYL CITRATE (PF) 100 MCG/2ML IJ SOLN
100.0000 ug | INTRAMUSCULAR | Status: DC | PRN
  Administered 2017-05-24: 100 ug via INTRAVENOUS
  Filled 2017-05-24: qty 2

## 2017-05-24 MED ORDER — IOPAMIDOL (ISOVUE-370) INJECTION 76%
75.0000 mL | Freq: Once | INTRAVENOUS | Status: AC | PRN
  Administered 2017-05-24: 75 mL via INTRAVENOUS

## 2017-05-24 MED ORDER — HALOPERIDOL LACTATE 5 MG/ML IJ SOLN
5.0000 mg | Freq: Once | INTRAMUSCULAR | Status: AC
  Administered 2017-05-24: 5 mg via INTRAMUSCULAR
  Filled 2017-05-24: qty 1

## 2017-05-24 NOTE — ED Notes (Signed)
Pt ambulated in hallway w/o oxygen. Pts O2 sat remained between 92-95%

## 2017-05-24 NOTE — ED Notes (Signed)
[name] from [facility] at bedside

## 2017-05-24 NOTE — ED Notes (Signed)
This RN called Ronalee Belts, boyfriend, [phone]. Trying to find a ride still.

## 2017-05-24 NOTE — ED Notes (Addendum)
esignature pad not picking up pt signature. Pt verbalized understanding of DC.   Pt given diet ginger ale and wheeled to lobby to wait for cab. Cab was called by this RN. Stated he would be here in 30 minutes.

## 2017-05-24 NOTE — ED Notes (Signed)
This RN asked where pt was stuck for blood last week. Pt states she doesn't know. Asked Mateo Flow RN to look for blood draw and IV start.

## 2017-05-24 NOTE — ED Notes (Signed)
Pt taken to CT via stretcher.

## 2017-05-24 NOTE — ED Triage Notes (Signed)
Pt arrived via ems for c/o chest pain since last Wednesday - the pain is on the left side and radiates into left side of neck, back , and shoulder - pt reports 9/10 pain to ems - she was given asa 324mg  and ntg x2 - she is a dialysis pt and goes M-W-F - she was admitted to the hospital last Wed for issue with fistula and d/c Friday - She reports that she was having same chest pain at that time -0 Pt is diabetic CBG 307

## 2017-05-24 NOTE — ED Notes (Signed)
Attempted to draw blood x2 without success - called lab to draw blood

## 2017-05-24 NOTE — ED Notes (Signed)
This RN called Edison Nasuti, pt son. Pt is talking to Edison Nasuti on the phone trying to find a ride home.

## 2017-05-24 NOTE — ED Provider Notes (Signed)
[facility] Emergency Department Provider Note    First MD Initiated Contact with Patient 05/24/17 1128     (approximate)  I have reviewed the triage vital signs and the nursing notes.   HISTORY  Chief Complaint Chest Pain and Shortness of Breath    HPI [name] is a 51 y.o. female presents with chief complaint of left shoulder pain of neck pain and arm pain that has been present since last Wednesday. Patient was just admitted to the hospital due to infiltration of her AV fistula and was admitted for fistulogram with vascular surgery. Please see medical record for further details on that admission. States that since then she's had persistent discomfort. States that she was primarily concerned that she has a blood clot in her neck that has migrated down to her left chest and that is what is causing the pain. Patient immediately becomes tearful, And explains "I don't know what's wrong with me. "  States the pain has been unchanged since Wednesday. No trauma. No fevers. No worsening shortness of breath. Patient was post a have outpatient dialysis today but did not make it as her chair time was at 1145.   Past Medical History:  Diagnosis Date  . Anemia   . Anxiety   . Arthritis   . CHF (congestive heart failure) (HCC)    history of, has been cleared.  . Chronic kidney disease   . COPD (chronic obstructive pulmonary disease) ([place])   . Depression   . Diabetes mellitus without complication ([place])   . Emphysema of lung ([place])   . End stage renal disease ([place])   . Headache    migraines  . Hepatitis 2003   Hep C  . Hypercholesterolemia    pt denies  . Hypertension   . Peripheral vascular disease ([place])   . Pneumonia 2015  . Tobacco dependence    Family History  Problem Relation Age of Onset  . Hypertension Mother   . Heart disease Mother   . Hypertension Father    Past Surgical History:  Procedure Laterality Date  . AV FISTULA PLACEMENT Left  11/2014  . CHOLECYSTECTOMY    . cyst removed  from left hand Left 1989  . DIALYSIS/PERMA CATHETER INSERTION N/A 05/20/2017   Procedure: Dialysis/Perma Catheter Insertion and fistulagram/LUE angiogram;  Surgeon: Algernon Huxley, MD;  Location: [place] [facility];  Service: Cardiovascular;  Laterality: N/A;  . INCISION AND DRAINAGE ABSCESS N/A 07/16/2016   Procedure: INCISION AND DRAINAGE ABSCESS;  Surgeon: Carloyn Manner, MD;  Location: [facility];  Service: ENT;  Laterality: N/A;  . PERIPHERAL VASCULAR CATHETERIZATION N/A 07/25/2015   Procedure: A/V Shuntogram/Fistulagram;  Surgeon: Algernon Huxley, MD;  Location: [place] [facility];  Service: Cardiovascular;  Laterality: N/A;  . PERIPHERAL VASCULAR CATHETERIZATION Left 07/25/2015   Procedure: A/V Shunt Intervention;  Surgeon: Algernon Huxley, MD;  Location: [place] [facility];  Service: Cardiovascular;  Laterality: Left;  . PERIPHERAL VASCULAR CATHETERIZATION Left 10/07/2015   Procedure: A/V Shuntogram/Fistulagram;  Surgeon: Algernon Huxley, MD;  Location: [place] [facility];  Service: Cardiovascular;  Laterality: Left;  . PERIPHERAL VASCULAR CATHETERIZATION N/A 10/07/2015   Procedure: A/V Shunt Intervention;  Surgeon: Algernon Huxley, MD;  Location: [place] [facility];  Service: Cardiovascular;  Laterality: N/A;  . PERIPHERAL VASCULAR CATHETERIZATION  10/07/2015   Procedure: Dialysis/Perma Catheter Insertion;  Surgeon: Algernon Huxley, MD;  Location: [place] [facility];  Service: Cardiovascular;;  . PERIPHERAL  VASCULAR CATHETERIZATION N/A 12/17/2015   Procedure: Dialysis/Perma Catheter Removal;  Surgeon: Katha Cabal, MD;  Location: [place] [facility];  Service: Cardiovascular;  Laterality: N/A;  . PERIPHERAL VASCULAR CATHETERIZATION N/A 01/11/2017   Procedure: Visceral Angiography;  Surgeon: Algernon Huxley, MD;  Location: [place] [facility];  Service: Cardiovascular;  Laterality: N/A;  . PERIPHERAL VASCULAR CATHETERIZATION N/A 01/11/2017   Procedure:  Visceral Artery Intervention;  Surgeon: Algernon Huxley, MD;  Location: [place] [facility];  Service: Cardiovascular;  Laterality: N/A;  . rt. tubal and ovary removed    . TONSILLECTOMY     Patient Active Problem List   Diagnosis Date Noted  . Dialysis AV fistula malfunction ([place]) 05/19/2017  . Hyperkalemia 01/08/2017  . Generalized abdominal pain 12/31/2016  . Diabetes ([place]) 12/18/2016  . SMA stenosis ([place]) 12/18/2016  . Atypical chest pain 10/20/2016  . Kidney transplant candidate 08/11/2016  . MRSA (methicillin resistant Staphylococcus aureus) infection 07/18/2016  . Status post incision and drainage 07/18/2016  . Chronic ulcer of left foot ([place]) 07/18/2016  . Acquired palmar and plantar hyperkeratosis 07/18/2016  . Elevated troponin 07/18/2016  . Essential hypertension 07/18/2016  . Epistaxis 07/18/2016  . Tobacco abuse 07/18/2016  . ESRD on dialysis ([place]) 07/18/2016  . Orbital cellulitis on right 07/13/2016  . Cellulitis 06/06/2016  . Adjustment disorder with mixed disturbance of emotions and conduct 05/11/2016  . Dysthymia 05/11/2016  . Diabetic macular edema ([place]) 08/24/2013  . Hypertensive retinopathy of both eyes 08/24/2013  . Non-proliferative diabetic retinopathy, severe, both eyes ([place]) 08/24/2013  . Psoriatic arthropathy ([place]) 11/14/2012      Prior to Admission medications   Medication Sig Start Date End Date Taking? Authorizing Provider  b complex-vitamin c-folic acid (NEPHRO-VITE) 0.8 MG TABS tablet Take 1 tablet by mouth daily. On dialysis days takes in evening and on non-dialysis days morning   Yes [provider]  calcium acetate (PHOSLO) 667 MG capsule Take 1,334 mg by mouth 3 (three) times daily with meals.    Yes [provider]  diltiazem (TIAZAC) 120 MG 24 hr capsule Take 120 mg by mouth 2 (two) times daily. 11/22/14  Yes [provider]  insulin aspart (NOVOLOG) 100 UNIT/ML injection Inject 0-9 Units into the skin 3 (three) times  daily with meals. Patient taking differently: Inject 5-10 Units into the skin 3 (three) times daily with meals.  05/21/17  Yes Epifanio Lesches, MD  lisinopril (PRINIVIL,ZESTRIL) 20 MG tablet Take 20 mg by mouth at bedtime.   Yes [provider]  sevelamer carbonate (RENVELA) 800 MG tablet Take 1,600 mg by mouth 3 (three) times daily with meals.   Yes [provider]  clobetasol ointment (TEMOVATE) 7.20 % Apply 1 application topically daily as needed (for psorasis).     [provider]  oxyCODONE 10 MG TABS Take 1 tablet (10 mg total) by mouth every 6 (six) hours as needed for moderate pain. 05/21/17   Epifanio Lesches, MD  promethazine (PHENERGAN) 25 MG tablet Take 1 tablet (25 mg total) by mouth every 4 (four) hours as needed for nausea or vomiting. 10/19/16   [name], MD    Allergies Cinnamon; Cinoxate; Tylenol [acetaminophen]; Ciprofloxacin; Garlic; Onion; and Prednisone    Social History Social History  Substance Use Topics  . Smoking status: Current Every Day Smoker    Packs/day: 0.25    Years: 17.00    Types: Cigarettes  . Smokeless tobacco: Never Used  . Alcohol use No  Review of Systems Patient denies headaches, rhinorrhea, blurry vision, numbness, shortness of breath, chest pain, edema, cough, abdominal pain, nausea, vomiting, diarrhea, dysuria, fevers, rashes or hallucinations unless otherwise stated above in HPI. ____________________________________________   PHYSICAL EXAM:  VITAL SIGNS: Vitals:   05/24/17 1800 05/24/17 1815  BP: (!) 153/62   Pulse: 64 66  Resp: (!) 9 11  Temp:      Constitutional: Alert and oriented. Anxious and tearful Eyes: Conjunctivae are normal.  Head: Atraumatic. Nose: No congestion/rhinnorhea. Mouth/Throat: Mucous membranes are moist.   Neck: No stridor. Painless ROM.  Cardiovascular: Normal rate, regular rhythm. Grossly normal heart sounds.  Good peripheral circulation.  Ecchymosis and  ttp of left arm.  Palpable radial pulses bilaterally, no erythema or evidence of infectious process at this time Respiratory: tachypnea when crying but good air movement throughout, no wheezing or rhonchi Gastrointestinal: Soft and nontender. No distention. No abdominal bruits. No CVA tenderness. Musculoskeletal: No lower extremity tenderness nor edema.  No joint effusions. Neurologic:  Normal speech and language. No gross focal neurologic deficits are appreciated. No facial droop Skin:  Skin is warm, dry and intact. No rash noted. Psychiatric: Mood and affect are normal. Speech and behavior are normal.  ____________________________________________   LABS (all labs ordered are listed, but only abnormal results are displayed)  Results for orders placed or performed during the hospital encounter of 05/24/17 (from the past 24 hour(s))  Basic metabolic panel     Status: Abnormal   Collection Time: 05/24/17  1:09 PM  Result Value Ref Range   Sodium 137 135 - 145 mmol/L   Potassium 4.4 3.5 - 5.1 mmol/L   Chloride 97 (L) 101 - 111 mmol/L   CO2 26 22 - 32 mmol/L   Glucose, Bld 304 (H) 65 - 99 mg/dL   BUN 65 (H) 6 - 20 mg/dL   Creatinine, Ser 7.41 (H) 0.44 - 1.00 mg/dL   Calcium 7.0 (L) 8.9 - 10.3 mg/dL   GFR calc non Af Amer 6 (L) >60 mL/min   GFR calc Af Amer 7 (L) >60 mL/min   Anion gap 14 5 - 15  CBC     Status: Abnormal   Collection Time: 05/24/17  1:09 PM  Result Value Ref Range   WBC 6.3 3.6 - 11.0 K/uL   RBC 2.90 (L) 3.80 - 5.20 MIL/uL   Hemoglobin 9.7 (L) 12.0 - 16.0 g/dL   HCT 27.5 (L) 35.0 - 47.0 %   MCV 94.8 80.0 - 100.0 fL   MCH 33.3 26.0 - 34.0 pg   MCHC 35.2 32.0 - 36.0 g/dL   RDW 13.3 11.5 - 14.5 %   Platelets 149 (L) 150 - 440 K/uL  Troponin I     Status: None   Collection Time: 05/24/17  1:09 PM  Result Value Ref Range   Troponin I <0.03 <0.03 ng/mL  Hepatic function panel     Status: Abnormal   Collection Time: 05/24/17  1:09 PM  Result Value Ref Range   Total  Protein 8.0 6.5 - 8.1 g/dL   Albumin 3.3 (L) 3.5 - 5.0 g/dL   AST 26 15 - 41 U/L   ALT 9 (L) 14 - 54 U/L   Alkaline Phosphatase 76 38 - 126 U/L   Total Bilirubin 0.4 0.3 - 1.2 mg/dL   Bilirubin, Direct <0.1 (L) 0.1 - 0.5 mg/dL   Indirect Bilirubin NOT CALCULATED 0.3 - 0.9 mg/dL  Troponin I     Status: None  Collection Time: 05/24/17  5:27 PM  Result Value Ref Range   Troponin I <0.03 <0.03 ng/mL   ____________________________________________  EKG My review and personal interpretation at Time: 11:33   Indication: chest pain  Rate: 80  Rhythm: sinus Axis: normal Other: normal intervals, no STEMI or depressions ____________________________________________  RADIOLOGY  I personally reviewed all radiographic images ordered to evaluate for the above acute complaints and reviewed radiology reports and findings.  These findings were personally discussed with the patient.  Please see medical record for radiology report.  ____________________________________________   PROCEDURES  Procedure(s) performed:  Procedures    Critical Care performed: no ____________________________________________   INITIAL IMPRESSION / ASSESSMENT AND PLAN / ED COURSE  Pertinent labs & imaging results that were available during my care of the patient were reviewed by me and considered in my medical decision making (see chart for details).  DDX: ACS, pericarditis, esophagitis, boerhaaves, pe, dissection, pna, bronchitis, costochondritis   [name] is a 51 y.o. who presents to the ED with left-sided chest wall pain and left arm pain as described above. Symptoms similar to symptoms for the past several days. Initial EKG shows no evidence of acute ischemia.  She has good pulses throughout therefore do not feel this is clinically consistent with dissection and she has a chest x-ray with a normal mediastinum. She has no carotid bruits. Fistula appears intact and her left arm is soft suggesting against any  compartment syndrome. Patient does seem very anxious therefore provide Ativan.    Clinical Course as of May 24 1826  Mon May 24, 2017  1304 Patient with persistent tachypnea that she does not have any hypoxia. Says the pain is improved with Ativan suggesting some component of anxiety but based on her risk factors will order CT angiogram to evaluate for any evidence of pulmonary embolism as she is not on any anticoagulation.  Given her persistent pain will also give a small dose of IM Haldol which is excellent analgesic properties and will also assist with her component of underlying anxiety.  [PR]  1315 Patient reassessed. Pain significant improved. Did have mild desaturation likely secondary to medications. Patient placed on nasal cannula. Protecting her airway. I did speak with Dr. Holley Raring nephrology who states patient will be stable for outpatient dialysis in 24 hours following contrast load assuming that she has no edema or hyperkalemia.  [PR]  7846 Echo with small pericardial effusion, no hemodynamic compromise.  [PR]  1708 Patient reassessed. States the pain has not really improved. Currently resting. We'll repeat troponin and ambulate to make sure that she has no hypoxia.  [PR]  1818 Repeat troponin is negative.  [PR]  9629 I spoke with Dr. Holley Raring nephrology again he states they do have dialysis chair availability tomorrow morning he agrees that based on her laboratory values at this point that she has no indication for emergent dialysis. Patient is sitting comfortably in her bed. Denies any significant chest pain at this time. Do feel this is likely musculoskeletal related to previous infiltration and bruising for which she was admitted. She has no hypoxia at rest and is able to ambulate without any shortness of breath. Do feel patient is stable for follow-up tomorrow a.m.  Have discussed with the patient and available family all diagnostics and treatments performed thus far and all questions were  answered to the best of my ability. The patient demonstrates understanding and agreement with plan.   [PR]    Clinical Course User Index [PR]  Merlyn Lot, MD     ____________________________________________   FINAL CLINICAL IMPRESSION(S) / ED DIAGNOSES  Final diagnoses:  Chest pain  Left-sided chest wall pain  ESRD (end stage renal disease) ([place])      NEW MEDICATIONS STARTED DURING THIS VISIT:  New Prescriptions   No medications on file     Note:  This document was prepared using Dragon voice recognition software and may include unintentional dictation errors.    Merlyn Lot, MD 05/24/17 [phone]

## 2017-05-24 NOTE — Progress Notes (Signed)
*  PRELIMINARY RESULTS* Echocardiogram 2D Echocardiogram has been performed.  [name] 05/24/2017, 3:34 PM

## 2017-05-25 ENCOUNTER — Telehealth: Payer: Self-pay

## 2017-05-25 DIAGNOSIS — D631 Anemia in chronic kidney disease: Secondary | ICD-10-CM | POA: Diagnosis not present

## 2017-05-25 DIAGNOSIS — Z23 Encounter for immunization: Secondary | ICD-10-CM | POA: Diagnosis not present

## 2017-05-25 DIAGNOSIS — D509 Iron deficiency anemia, unspecified: Secondary | ICD-10-CM | POA: Diagnosis not present

## 2017-05-25 DIAGNOSIS — Z992 Dependence on renal dialysis: Secondary | ICD-10-CM | POA: Diagnosis not present

## 2017-05-25 DIAGNOSIS — N186 End stage renal disease: Secondary | ICD-10-CM | POA: Diagnosis not present

## 2017-05-25 NOTE — Telephone Encounter (Signed)
Tried to call patient, she was seen in ED on 05/24/17 for SOB/CP   Will try again at a later time

## 2017-05-26 DIAGNOSIS — D631 Anemia in chronic kidney disease: Secondary | ICD-10-CM | POA: Diagnosis not present

## 2017-05-26 DIAGNOSIS — Z992 Dependence on renal dialysis: Secondary | ICD-10-CM | POA: Diagnosis not present

## 2017-05-26 DIAGNOSIS — Z23 Encounter for immunization: Secondary | ICD-10-CM | POA: Diagnosis not present

## 2017-05-26 DIAGNOSIS — N186 End stage renal disease: Secondary | ICD-10-CM | POA: Diagnosis not present

## 2017-05-26 DIAGNOSIS — D509 Iron deficiency anemia, unspecified: Secondary | ICD-10-CM | POA: Diagnosis not present

## 2017-05-28 DIAGNOSIS — D509 Iron deficiency anemia, unspecified: Secondary | ICD-10-CM | POA: Diagnosis not present

## 2017-05-28 DIAGNOSIS — N186 End stage renal disease: Secondary | ICD-10-CM | POA: Diagnosis not present

## 2017-05-28 DIAGNOSIS — Z23 Encounter for immunization: Secondary | ICD-10-CM | POA: Diagnosis not present

## 2017-05-28 DIAGNOSIS — D631 Anemia in chronic kidney disease: Secondary | ICD-10-CM | POA: Diagnosis not present

## 2017-05-28 DIAGNOSIS — Z992 Dependence on renal dialysis: Secondary | ICD-10-CM | POA: Diagnosis not present

## 2017-05-31 DIAGNOSIS — Z992 Dependence on renal dialysis: Secondary | ICD-10-CM | POA: Diagnosis not present

## 2017-05-31 DIAGNOSIS — Z23 Encounter for immunization: Secondary | ICD-10-CM | POA: Diagnosis not present

## 2017-05-31 DIAGNOSIS — N186 End stage renal disease: Secondary | ICD-10-CM | POA: Diagnosis not present

## 2017-05-31 DIAGNOSIS — D631 Anemia in chronic kidney disease: Secondary | ICD-10-CM | POA: Diagnosis not present

## 2017-05-31 DIAGNOSIS — D509 Iron deficiency anemia, unspecified: Secondary | ICD-10-CM | POA: Diagnosis not present

## 2017-05-31 NOTE — Telephone Encounter (Signed)
Tried to call patient, she was seen in ED on 05/24/17 for SOB/CP   Will try again at a later time

## 2017-06-04 DIAGNOSIS — D509 Iron deficiency anemia, unspecified: Secondary | ICD-10-CM | POA: Diagnosis not present

## 2017-06-04 DIAGNOSIS — Z23 Encounter for immunization: Secondary | ICD-10-CM | POA: Diagnosis not present

## 2017-06-04 DIAGNOSIS — E113512 Type 2 diabetes mellitus with proliferative diabetic retinopathy with macular edema, left eye: Secondary | ICD-10-CM | POA: Diagnosis not present

## 2017-06-04 DIAGNOSIS — Z992 Dependence on renal dialysis: Secondary | ICD-10-CM | POA: Diagnosis not present

## 2017-06-04 DIAGNOSIS — D631 Anemia in chronic kidney disease: Secondary | ICD-10-CM | POA: Diagnosis not present

## 2017-06-04 DIAGNOSIS — N186 End stage renal disease: Secondary | ICD-10-CM | POA: Diagnosis not present

## 2017-06-07 DIAGNOSIS — D509 Iron deficiency anemia, unspecified: Secondary | ICD-10-CM | POA: Diagnosis not present

## 2017-06-07 DIAGNOSIS — D631 Anemia in chronic kidney disease: Secondary | ICD-10-CM | POA: Diagnosis not present

## 2017-06-07 DIAGNOSIS — N186 End stage renal disease: Secondary | ICD-10-CM | POA: Diagnosis not present

## 2017-06-07 DIAGNOSIS — Z992 Dependence on renal dialysis: Secondary | ICD-10-CM | POA: Diagnosis not present

## 2017-06-07 DIAGNOSIS — Z23 Encounter for immunization: Secondary | ICD-10-CM | POA: Diagnosis not present

## 2017-06-07 NOTE — Telephone Encounter (Signed)
Number was not taking calls  Will try again at a later time

## 2017-06-09 DIAGNOSIS — Z992 Dependence on renal dialysis: Secondary | ICD-10-CM | POA: Diagnosis not present

## 2017-06-09 DIAGNOSIS — D631 Anemia in chronic kidney disease: Secondary | ICD-10-CM | POA: Diagnosis not present

## 2017-06-09 DIAGNOSIS — N186 End stage renal disease: Secondary | ICD-10-CM | POA: Diagnosis not present

## 2017-06-09 DIAGNOSIS — D509 Iron deficiency anemia, unspecified: Secondary | ICD-10-CM | POA: Diagnosis not present

## 2017-06-09 DIAGNOSIS — Z23 Encounter for immunization: Secondary | ICD-10-CM | POA: Diagnosis not present

## 2017-06-11 DIAGNOSIS — D631 Anemia in chronic kidney disease: Secondary | ICD-10-CM | POA: Diagnosis not present

## 2017-06-11 DIAGNOSIS — Z992 Dependence on renal dialysis: Secondary | ICD-10-CM | POA: Diagnosis not present

## 2017-06-11 DIAGNOSIS — D509 Iron deficiency anemia, unspecified: Secondary | ICD-10-CM | POA: Diagnosis not present

## 2017-06-11 DIAGNOSIS — Z23 Encounter for immunization: Secondary | ICD-10-CM | POA: Diagnosis not present

## 2017-06-11 DIAGNOSIS — N186 End stage renal disease: Secondary | ICD-10-CM | POA: Diagnosis not present

## 2017-06-14 DIAGNOSIS — Z23 Encounter for immunization: Secondary | ICD-10-CM | POA: Diagnosis not present

## 2017-06-14 DIAGNOSIS — N186 End stage renal disease: Secondary | ICD-10-CM | POA: Diagnosis not present

## 2017-06-14 DIAGNOSIS — D509 Iron deficiency anemia, unspecified: Secondary | ICD-10-CM | POA: Diagnosis not present

## 2017-06-14 DIAGNOSIS — Z992 Dependence on renal dialysis: Secondary | ICD-10-CM | POA: Diagnosis not present

## 2017-06-14 DIAGNOSIS — D631 Anemia in chronic kidney disease: Secondary | ICD-10-CM | POA: Diagnosis not present

## 2017-06-16 DIAGNOSIS — N186 End stage renal disease: Secondary | ICD-10-CM | POA: Diagnosis not present

## 2017-06-16 DIAGNOSIS — Z23 Encounter for immunization: Secondary | ICD-10-CM | POA: Diagnosis not present

## 2017-06-16 DIAGNOSIS — D631 Anemia in chronic kidney disease: Secondary | ICD-10-CM | POA: Diagnosis not present

## 2017-06-16 DIAGNOSIS — D509 Iron deficiency anemia, unspecified: Secondary | ICD-10-CM | POA: Diagnosis not present

## 2017-06-16 DIAGNOSIS — Z992 Dependence on renal dialysis: Secondary | ICD-10-CM | POA: Diagnosis not present

## 2017-06-18 DIAGNOSIS — D509 Iron deficiency anemia, unspecified: Secondary | ICD-10-CM | POA: Diagnosis not present

## 2017-06-18 DIAGNOSIS — Z992 Dependence on renal dialysis: Secondary | ICD-10-CM | POA: Diagnosis not present

## 2017-06-18 DIAGNOSIS — D631 Anemia in chronic kidney disease: Secondary | ICD-10-CM | POA: Diagnosis not present

## 2017-06-18 DIAGNOSIS — N186 End stage renal disease: Secondary | ICD-10-CM | POA: Diagnosis not present

## 2017-06-18 DIAGNOSIS — Z23 Encounter for immunization: Secondary | ICD-10-CM | POA: Diagnosis not present

## 2017-06-19 DIAGNOSIS — N186 End stage renal disease: Secondary | ICD-10-CM | POA: Diagnosis not present

## 2017-06-19 DIAGNOSIS — Z992 Dependence on renal dialysis: Secondary | ICD-10-CM | POA: Diagnosis not present

## 2017-06-21 DIAGNOSIS — D509 Iron deficiency anemia, unspecified: Secondary | ICD-10-CM | POA: Diagnosis not present

## 2017-06-21 DIAGNOSIS — D631 Anemia in chronic kidney disease: Secondary | ICD-10-CM | POA: Diagnosis not present

## 2017-06-21 DIAGNOSIS — N186 End stage renal disease: Secondary | ICD-10-CM | POA: Diagnosis not present

## 2017-06-21 DIAGNOSIS — Z23 Encounter for immunization: Secondary | ICD-10-CM | POA: Diagnosis not present

## 2017-06-21 DIAGNOSIS — N2581 Secondary hyperparathyroidism of renal origin: Secondary | ICD-10-CM | POA: Diagnosis not present

## 2017-06-21 DIAGNOSIS — Z992 Dependence on renal dialysis: Secondary | ICD-10-CM | POA: Diagnosis not present

## 2017-06-23 DIAGNOSIS — D509 Iron deficiency anemia, unspecified: Secondary | ICD-10-CM | POA: Diagnosis not present

## 2017-06-23 DIAGNOSIS — Z992 Dependence on renal dialysis: Secondary | ICD-10-CM | POA: Diagnosis not present

## 2017-06-23 DIAGNOSIS — N186 End stage renal disease: Secondary | ICD-10-CM | POA: Diagnosis not present

## 2017-06-23 DIAGNOSIS — Z23 Encounter for immunization: Secondary | ICD-10-CM | POA: Diagnosis not present

## 2017-06-23 DIAGNOSIS — D631 Anemia in chronic kidney disease: Secondary | ICD-10-CM | POA: Diagnosis not present

## 2017-06-23 DIAGNOSIS — N2581 Secondary hyperparathyroidism of renal origin: Secondary | ICD-10-CM | POA: Diagnosis not present

## 2017-06-25 DIAGNOSIS — D631 Anemia in chronic kidney disease: Secondary | ICD-10-CM | POA: Diagnosis not present

## 2017-06-25 DIAGNOSIS — Z23 Encounter for immunization: Secondary | ICD-10-CM | POA: Diagnosis not present

## 2017-06-25 DIAGNOSIS — D509 Iron deficiency anemia, unspecified: Secondary | ICD-10-CM | POA: Diagnosis not present

## 2017-06-25 DIAGNOSIS — N186 End stage renal disease: Secondary | ICD-10-CM | POA: Diagnosis not present

## 2017-06-25 DIAGNOSIS — N2581 Secondary hyperparathyroidism of renal origin: Secondary | ICD-10-CM | POA: Diagnosis not present

## 2017-06-25 DIAGNOSIS — Z992 Dependence on renal dialysis: Secondary | ICD-10-CM | POA: Diagnosis not present

## 2017-06-25 LAB — CBC AND DIFFERENTIAL
HEMATOCRIT: 31 — AB (ref 36–46)
Hemoglobin: 9.9 — AB (ref 12.0–16.0)

## 2017-06-25 LAB — BASIC METABOLIC PANEL
Creatinine: 6.8 — AB (ref <=1.1)
Potassium: 4.6 (ref 3.4–5.3)
SODIUM: 133 — AB (ref 137–147)

## 2017-06-28 DIAGNOSIS — N2581 Secondary hyperparathyroidism of renal origin: Secondary | ICD-10-CM | POA: Diagnosis not present

## 2017-06-28 DIAGNOSIS — D631 Anemia in chronic kidney disease: Secondary | ICD-10-CM | POA: Diagnosis not present

## 2017-06-28 DIAGNOSIS — Z992 Dependence on renal dialysis: Secondary | ICD-10-CM | POA: Diagnosis not present

## 2017-06-28 DIAGNOSIS — N186 End stage renal disease: Secondary | ICD-10-CM | POA: Diagnosis not present

## 2017-06-28 DIAGNOSIS — Z23 Encounter for immunization: Secondary | ICD-10-CM | POA: Diagnosis not present

## 2017-06-28 DIAGNOSIS — D509 Iron deficiency anemia, unspecified: Secondary | ICD-10-CM | POA: Diagnosis not present

## 2017-06-29 ENCOUNTER — Ambulatory Visit (INDEPENDENT_AMBULATORY_CARE_PROVIDER_SITE_OTHER): Payer: Medicare Other | Admitting: Nurse Practitioner

## 2017-06-29 ENCOUNTER — Encounter: Payer: Self-pay | Admitting: Nurse Practitioner

## 2017-06-29 VITALS — BP 158/74 | HR 80 | Temp 98.9°F | Ht 67.0 in | Wt 209.6 lb

## 2017-06-29 DIAGNOSIS — Z7689 Persons encountering health services in other specified circumstances: Secondary | ICD-10-CM

## 2017-06-29 DIAGNOSIS — K591 Functional diarrhea: Secondary | ICD-10-CM | POA: Diagnosis not present

## 2017-06-29 DIAGNOSIS — I10 Essential (primary) hypertension: Secondary | ICD-10-CM

## 2017-06-29 DIAGNOSIS — F419 Anxiety disorder, unspecified: Secondary | ICD-10-CM | POA: Diagnosis not present

## 2017-06-29 MED ORDER — BUSPIRONE HCL 5 MG PO TABS: 5.0000 mg | ORAL_TABLET | Freq: Two times a day (BID) | ORAL | 2 refills | Status: DC

## 2017-06-29 MED ORDER — PSYLLIUM 28 % PO PACK: PACK | ORAL | 4 refills | Status: DC

## 2017-06-29 NOTE — Patient Instructions (Addendum)
[name], Thank you for coming in to clinic today.  1. Call Dr. Georgeann Oppenheim office to re-establish for your testing and hepatitis treatment.  2. For your Blood Pressure: - Continue diltiazem 24 hr 120 mg twice daily without change. - START your valsartan 80 mg in evening. - CHECK your BP 2-3 x weekly. GOAL is less than 130/90 and higher than 100/70.  Call if less than 100/70 and we will make changes to your medications.  3. For your diarrhea: - START 1/4 to 1/2 dose of metamucil (psyllium fiber) once daily.  4. For your anxiety: - Start taking buspirone 5 mg once daily x 1 week. - Increase to twice daily and continue.  Follow up if symptom control is not adequate.   Please schedule a follow-up appointment with Cassell Smiles, AGNP to Return in about 2 weeks (around 07/13/2017) for diabetes.  If you have any other questions or concerns, please feel free to call the clinic or send a message through [place]. You may also schedule an earlier appointment if necessary.  Cassell Smiles, DNP, AGNP-BC Adult Gerontology Nurse Practitioner [facility], [facility]    Irritable Bowel Syndrome, Adult Irritable bowel syndrome (IBS) is not one specific disease. It is a group of symptoms that affects the organs responsible for digestion (gastrointestinal or GI tract). To regulate how your GI tract works, your body sends signals back and forth between your intestines and your brain. If you have IBS, there may be a problem with these signals. As a result, your GI tract does not function normally. Your intestines may become more sensitive and overreact to certain things. This is especially true when you eat certain foods or when you are under stress. There are four types of IBS. These may be determined based on the consistency of your stool:  IBS with diarrhea.  IBS with constipation.  Mixed IBS.  Unsubtyped IBS.  It is important to know which type of IBS you have. Some treatments are more  likely to be helpful for certain types of IBS. What are the causes? The exact cause of IBS is not known. What increases the risk? You may have a higher risk of IBS if:  You are a woman.  You are younger than 53 years old.  You have a family history of IBS.  You have mental health problems.  You have had bacterial infection of your GI tract.  What are the signs or symptoms? Symptoms of IBS vary from person to person. The main symptom is abdominal pain or discomfort. Additional symptoms usually include one or more of the following:  Diarrhea, constipation, or both.  Abdominal swelling or bloating.  Feeling full or sick after eating a small or regular-size meal.  Frequent gas.  Mucus in the stool.  A feeling of having more stool left after a bowel movement.  Symptoms tend to come and go. They may be associated with stress, psychiatric conditions, or nothing at all. How is this diagnosed? There is no specific test to diagnose IBS. Your health care provider will make a diagnosis based on a physical exam, medical history, and your symptoms. You may have other tests to rule out other conditions that may be causing your symptoms. These may include:  Blood tests.  X-rays.  CT scan.  Endoscopy and colonoscopy. This is a test in which your GI tract is viewed with a long, thin, flexible tube.  How is this treated? There is no cure for IBS, but treatment can help  relieve symptoms. IBS treatment often includes:  Changes to your diet, such as: ? Eating more fiber. ? Avoiding foods that cause symptoms. ? Drinking more water. ? Eating regular, medium-sized portioned meals.  Medicines. These may include: ? Fiber supplements if you have constipation. ? Medicine to control diarrhea (antidiarrheal medicines). ? Medicine to help control muscle spasms in your GI tract (antispasmodic medicines). ? Medicines to help with any mental health issues, such as antidepressants or  tranquilizers.  Therapy. ? Talk therapy may help with anxiety, depression, or other mental health issues that can make IBS symptoms worse.  Stress reduction. ? Managing your stress can help keep symptoms under control.  Follow these instructions at home:  Take medicines only as directed by your health care provider.  Eat a healthy diet. ? Avoid foods and drinks with added sugar. ? Include more whole grains, fruits, and vegetables gradually into your diet. This may be especially helpful if you have IBS with constipation. ? Avoid any foods and drinks that make your symptoms worse. These may include dairy products and caffeinated or carbonated drinks. ? Do not eat large meals. ? Drink enough fluid to keep your urine clear or pale yellow.  Exercise regularly. Ask your health care provider for recommendations of good activities for you.  Keep all follow-up visits as directed by your health care provider. This is important. Contact a health care provider if:  You have constant pain.  You have trouble or pain with swallowing.  You have worsening diarrhea. Get help right away if:  You have severe and worsening abdominal pain.  You have diarrhea and: ? You have a rash, stiff neck, or severe headache. ? You are irritable, sleepy, or difficult to awaken. ? You are weak, dizzy, or extremely thirsty.  You have bright red blood in your stool or you have black tarry stools.  You have unusual abdominal swelling that is painful.  You vomit continuously.  You vomit blood (hematemesis).  You have both abdominal pain and a fever. This information is not intended to replace advice given to you by your health care provider. Make sure you discuss any questions you have with your health care provider. Document Released: 12/07/2005 Document Revised: 05/08/2016 Document Reviewed: 08/24/2014 Elsevier Interactive Patient Education  2018 [employer].

## 2017-06-29 NOTE — Progress Notes (Signed)
Subjective:    Patient ID: [name], female    DOB: 1966/04/07, 51 y.o.   MRN: [id number]  [name] is a 51 y.o. female presenting on 06/29/2017 for Establish Care (diabetes, anxiety) and Hypertension   HPI Establish Care New Provider Pt last seen by PCP several months ago.  Obtain records from Josephine Cables at [facility].   Anxiety Pt was prescribed escitalopram by nephrologist, but has taken it yet.  She reports significant anxiety w/ dialysis - needles, BP cuff, anxiety, fidgets, if naps - awakes and is worse.  Wants med for anxiety that can be taken when "it comes on."  Chronic Pain No pain management clinic for several months - r/t extended release oxy.  Pt stated wanted only short acting medication.  Had been previously seen at [facility]  Brief Review of Current Medical Problems Ataxia- pt reports frequent falls w/ muscle weakness Gastroparesis: - "no testing" Constipation: Pt reports she was scheduled for colonoscopy to evaluate this in past, but never followed up Diabetes: Pt takes Insulin: 5 units-10 units w/ meals If feels well will take 5 units, feels bad and checks sugar and is high, will take 10 units.  Will need more detailed review at later visit.   Hypertension Pt admits to transient headaches.  Occasional palpitations (more w/ dialysis).  Pt denies lightheadedness, dizziness, changes in vision, chest tightness/pressure, palpitations, leg swelling, sudden loss of speech or loss of consciousness.  Current medications: diltiazem-24 hr 120 mg bid, tolerating well without side effects.  Nephrologist started her on valsartan 80 mg once daily, but she has not taken it yet.   Pt notes last eye exam shows retinal hemorrhage, retinopathy and macular degeneration.  Past Medical History:  Diagnosis Date  . Allergy   . Anemia   . Anxiety    worse when not at home - bowel incontinence  .  Arthritis   . Ataxia   . CHF (congestive heart failure) (HCC)    history of, has been cleared.  . Chronic kidney disease   . COPD (chronic obstructive pulmonary disease) ([place])   . Depression   . Diabetes mellitus without complication ([place])   . Emphysema of lung ([place])   . End stage renal disease ([place])    MWF dialysis  . Headache    migraines  . Hepatitis 2003   Hep C  . Hypercholesterolemia    pt denies  . Hypertension   . Kidney disease   . Peripheral vascular disease ([place])   . Pneumonia 2015  . Psoriasis   . Tobacco dependence    Past Surgical History:  Procedure Laterality Date  . AV FISTULA PLACEMENT Left 11/2014  . CESAREAN SECTION    . CHOLECYSTECTOMY    . cyst removed  from left hand Left 1989  . DIALYSIS/PERMA CATHETER INSERTION N/A 05/20/2017   Procedure: Dialysis/Perma Catheter Insertion and fistulagram/LUE angiogram;  Surgeon: Algernon Huxley, MD;  Location: [place] [facility];  Service: Cardiovascular;  Laterality: N/A;  . INCISION AND DRAINAGE ABSCESS N/A 07/16/2016   Procedure: INCISION AND DRAINAGE ABSCESS;  Surgeon: Carloyn Manner, MD;  Location: [facility];  Service: ENT;  Laterality: N/A;  . PERIPHERAL VASCULAR CATHETERIZATION N/A 07/25/2015   Procedure: A/V Shuntogram/Fistulagram;  Surgeon: Algernon Huxley, MD;  Location: [place] [facility];  Service: Cardiovascular;  Laterality: N/A;  . PERIPHERAL VASCULAR CATHETERIZATION Left 07/25/2015   Procedure: A/V Shunt Intervention;  Surgeon: Corene Cornea  Bunnie Domino, MD;  Location: [place] [facility];  Service: Cardiovascular;  Laterality: Left;  . PERIPHERAL VASCULAR CATHETERIZATION Left 10/07/2015   Procedure: A/V Shuntogram/Fistulagram;  Surgeon: Algernon Huxley, MD;  Location: [place] [facility];  Service: Cardiovascular;  Laterality: Left;  . PERIPHERAL VASCULAR CATHETERIZATION N/A 10/07/2015   Procedure: A/V Shunt Intervention;  Surgeon: Algernon Huxley, MD;  Location: [place] [facility];  Service: Cardiovascular;  Laterality: N/A;    . PERIPHERAL VASCULAR CATHETERIZATION  10/07/2015   Procedure: Dialysis/Perma Catheter Insertion;  Surgeon: Algernon Huxley, MD;  Location: [place] [facility];  Service: Cardiovascular;;  . PERIPHERAL VASCULAR CATHETERIZATION N/A 12/17/2015   Procedure: Dialysis/Perma Catheter Removal;  Surgeon: Katha Cabal, MD;  Location: [place] [facility];  Service: Cardiovascular;  Laterality: N/A;  . PERIPHERAL VASCULAR CATHETERIZATION N/A 01/11/2017   Procedure: Visceral Angiography;  Surgeon: Algernon Huxley, MD;  Location: [place] [facility];  Service: Cardiovascular;  Laterality: N/A;  . PERIPHERAL VASCULAR CATHETERIZATION N/A 01/11/2017   Procedure: Visceral Artery Intervention;  Surgeon: Algernon Huxley, MD;  Location: [place] [facility];  Service: Cardiovascular;  Laterality: N/A;  . rt. tubal and ovary removed    . TONSILLECTOMY    . TUBAL LIGATION     Social History   Social History  . Marital status: Legally Separated    Spouse name: N/A  . Number of children: N/A  . Years of education: N/A   Occupational History  . disabled    Social History Main Topics  . Smoking status: Former Smoker    Packs/day: 0.25    Years: 17.00    Types: Cigarettes    Quit date: 05/21/2017  . Smokeless tobacco: Never Used  . Alcohol use No  . Drug use: No  . Sexual activity: Not on file   Other Topics Concern  . Not on file   Social History Narrative  . No narrative on file   Family History  Problem Relation Age of Onset  . Hypertension Mother   . Heart disease Mother   . Hypertension Father   . Skin cancer Father    Current Outpatient Prescriptions on File Prior to Visit  Medication Sig  . b complex-vitamin c-folic acid (NEPHRO-VITE) 0.8 MG TABS tablet Take 1 tablet by mouth daily. On dialysis days takes in evening and on non-dialysis days morning  . calcium acetate (PHOSLO) 667 MG capsule Take 1,334 mg by mouth 3 (three) times daily with meals.   . clobetasol ointment (TEMOVATE) 0.96 %  Apply 1 application topically daily as needed (for psorasis).   Marland Kitchen diltiazem (TIAZAC) 120 MG 24 hr capsule Take 120 mg by mouth 2 (two) times daily.  . insulin aspart (NOVOLOG) 100 UNIT/ML injection Inject 0-9 Units into the skin 3 (three) times daily with meals. (Patient taking differently: Inject 5-10 Units into the skin 3 (three) times daily with meals. )  . promethazine (PHENERGAN) 25 MG tablet Take 1 tablet (25 mg total) by mouth every 4 (four) hours as needed for nausea or vomiting.  . sevelamer carbonate (RENVELA) 800 MG tablet Take 1,600 mg by mouth 3 (three) times daily with meals.  Marland Kitchen lisinopril (PRINIVIL,ZESTRIL) 20 MG tablet Take 20 mg by mouth at bedtime.   No current facility-administered medications on file prior to visit.     Review of Systems  Constitutional: Positive for fatigue.  HENT: Negative.   Eyes: Positive for visual disturbance.       Central vision loss  of one eye  Respiratory: Positive for shortness of breath.   Cardiovascular: Negative.   Gastrointestinal: Positive for constipation and diarrhea.  Endocrine: Positive for polydipsia and polyphagia.  Genitourinary: Negative for pelvic pain.  Musculoskeletal: Positive for arthralgias, gait problem and joint swelling.       Pt w/ multiple joint arthralgias, gait instability r/t "ataxia," and walks w/ cane  Skin:       Uremic frosts  Allergic/Immunologic: Negative.   Neurological: Positive for weakness.  Hematological: Negative.   Psychiatric/Behavioral: Positive for agitation and dysphoric mood. Negative for suicidal ideas.   Per HPI unless specifically indicated above       Objective:    BP (!) 158/74 (BP Location: Right Arm, Patient Position: Sitting, Cuff Size: Large)   Pulse 80   Temp 98.9 F (37.2 C) (Oral)   Ht 5\' 7"  (1.702 m)   Wt 209 lb 9.6 oz (95.1 kg)   BMI 32.83 kg/m    Wt Readings from Last 3 Encounters:  06/29/17 209 lb 9.6 oz (95.1 kg)  05/24/17 208 lb (94.3 kg)  05/21/17 208 lb 1.8  oz (94.4 kg)    Physical Exam  Constitutional: She is oriented to person, place, and time. She appears well-developed and well-nourished. No distress.  HENT:  Head: Normocephalic and atraumatic.  Neck: Normal range of motion. Neck supple. No JVD present.  Cardiovascular: Normal rate, regular rhythm, S1 normal, S2 normal and normal pulses.   Murmur heard.  Systolic murmur is present with a grade of 2/6  Pulmonary/Chest: Effort normal and breath sounds normal. No respiratory distress.  Abdominal: Soft. She exhibits distension. She exhibits no mass. Bowel sounds are increased. There is no tenderness.  Musculoskeletal: She exhibits no edema.  Antalgic gait.  Pt walking w/ cane  Neurological: She is alert and oriented to person, place, and time.  Skin: Skin is warm, dry and intact. There is pallor.  Dusky appearing skin throughout.  Some pink tones present.  Psychiatric: She has a normal mood and affect. Her behavior is normal. Judgment and thought content normal. Her speech is tangential.   Results for orders placed or performed during the hospital encounter of 96/75/91  Basic metabolic panel  Result Value Ref Range   Sodium 137 135 - 145 mmol/L   Potassium 4.4 3.5 - 5.1 mmol/L   Chloride 97 (L) 101 - 111 mmol/L   CO2 26 22 - 32 mmol/L   Glucose, Bld 304 (H) 65 - 99 mg/dL   BUN 65 (H) 6 - 20 mg/dL   Creatinine, Ser 7.41 (H) 0.44 - 1.00 mg/dL   Calcium 7.0 (L) 8.9 - 10.3 mg/dL   GFR calc non Af Amer 6 (L) >60 mL/min   GFR calc Af Amer 7 (L) >60 mL/min   Anion gap 14 5 - 15  CBC  Result Value Ref Range   WBC 6.3 3.6 - 11.0 K/uL   RBC 2.90 (L) 3.80 - 5.20 MIL/uL   Hemoglobin 9.7 (L) 12.0 - 16.0 g/dL   HCT 27.5 (L) 35.0 - 47.0 %   MCV 94.8 80.0 - 100.0 fL   MCH 33.3 26.0 - 34.0 pg   MCHC 35.2 32.0 - 36.0 g/dL   RDW 13.3 11.5 - 14.5 %   Platelets 149 (L) 150 - 440 K/uL  Troponin I  Result Value Ref Range   Troponin I <0.03 <0.03 ng/mL  Hepatic function panel  Result Value Ref  Range   Total Protein 8.0 6.5 - 8.1 g/dL  Albumin 3.3 (L) 3.5 - 5.0 g/dL   AST 26 15 - 41 U/L   ALT 9 (L) 14 - 54 U/L   Alkaline Phosphatase 76 38 - 126 U/L   Total Bilirubin 0.4 0.3 - 1.2 mg/dL   Bilirubin, Direct <0.1 (L) 0.1 - 0.5 mg/dL   Indirect Bilirubin NOT CALCULATED 0.3 - 0.9 mg/dL  Troponin I  Result Value Ref Range   Troponin I <0.03 <0.03 ng/mL  ECHOCARDIOGRAM COMPLETE  Result Value Ref Range   Weight 3,328 oz   Height 67 in   BP 150/64 mmHg      Assessment & Plan:   Problem List Items Addressed This Visit      Cardiovascular and Mediastinum   Essential hypertension    Uncontrolled.  Pt reports higher BP during dialysis - Per nephrologist diltiazem not effective after dialysis and ordered valsartan 80 mg once daily w/o initiation.  Plan: 1. Pt should start valsartan 80 mg once daily.  If needed, reduce diltiazem dose. 2. Discussed goal BP. 3. Follow up as needed and in 3 months.      Relevant Medications   valsartan (DIOVAN) 80 MG tablet   midodrine (PROAMATINE) 5 MG tablet     Other   Anxiety - Primary    Pt describes situational anxiety worse w/ dialysis treatments 3x weekly.  Pt prefers shorter acting medication over long acting med.  Is concerned about Lexapro side effects.  Plan: 1. Reviewed safety profile of benzos.  Discussed other options would be better choices.  Pt will agree to take buspar. 2. Continue Lexapro.  Discussed side effects and benefits.  Pt agrees to continue, but is still concerned about anxiety during treatments. 3. Start buspirone 5 mg once daily for 1 week.  Increase to twice daily for 1 week.  If effective at lower dose, may take lower dose. 4. Follow up 4-6 weeks.       Relevant Medications   escitalopram (LEXAPRO) 10 MG tablet   busPIRone (BUSPAR) 5 MG tablet    Other Visit Diagnoses    Functional diarrhea     Pt w/ diarrhea (somewhat associated w/ anxiety) and avoids leaving home.   Plan: 1. Start w/ 1/4 to 1/2 dose  metamucil fiber once daily.  Increase as needed until BM more formed, but not constipated. 2. Follow up as needed.    Encounter to establish care     Pt w/ recent close followup at [facility] and w/ dialysis MWF.  Needs new PCP closer to home.      Meds ordered this encounter  Medications  . fentaNYL (DURAGESIC - DOSED MCG/HR) 25 MCG/HR patch    Sig: apply 1 patch and replace every 72 hours    Refill:  0  . DISCONTD: oxyCODONE (OXYCONTIN) 10 mg 12 hr tablet    Sig: Take 10 mg by mouth every 12 (twelve) hours.    Refill:  0  . valsartan (DIOVAN) 80 MG tablet    Refill:  1  . escitalopram (LEXAPRO) 10 MG tablet    Sig: Take 10 mg by mouth every evening.    Refill:  0  . midodrine (PROAMATINE) 5 MG tablet    Sig: Take 5 mg by mouth as needed (when blood pressure is low).  . Nutritional Supplements (NEPRO PO)    Sig: Take by mouth.  . psyllium (METAMUCIL SMOOTH TEXTURE) 28 % packet    Sig: Take 1/4 to 1/2 packet twice daily.  Dispense:  30 packet    Refill:  4    Order Specific Question:   Supervising Provider    Answer:   Olin Hauser [[id number]]  . busPIRone (BUSPAR) 5 MG tablet    Sig: Take 1 tablet (5 mg total) by mouth 2 (two) times daily.    Dispense:  60 tablet    Refill:  2    Order Specific Question:   Supervising Provider    Answer:   Olin Hauser [[id number]]      Follow up plan: Return in about 2 weeks (around 07/13/2017) for diabetes.  Cassell Smiles, DNP, AGPCNP-BC Adult Gerontology Primary Care Nurse Practitioner [facility] 07/02/2017, 9:49 PM

## 2017-06-30 DIAGNOSIS — N2581 Secondary hyperparathyroidism of renal origin: Secondary | ICD-10-CM | POA: Diagnosis not present

## 2017-06-30 DIAGNOSIS — Z23 Encounter for immunization: Secondary | ICD-10-CM | POA: Diagnosis not present

## 2017-06-30 DIAGNOSIS — N186 End stage renal disease: Secondary | ICD-10-CM | POA: Diagnosis not present

## 2017-06-30 DIAGNOSIS — Z992 Dependence on renal dialysis: Secondary | ICD-10-CM | POA: Diagnosis not present

## 2017-06-30 DIAGNOSIS — D509 Iron deficiency anemia, unspecified: Secondary | ICD-10-CM | POA: Diagnosis not present

## 2017-06-30 DIAGNOSIS — D631 Anemia in chronic kidney disease: Secondary | ICD-10-CM | POA: Diagnosis not present

## 2017-07-02 ENCOUNTER — Encounter: Payer: Self-pay | Admitting: Nurse Practitioner

## 2017-07-02 DIAGNOSIS — N186 End stage renal disease: Secondary | ICD-10-CM | POA: Diagnosis not present

## 2017-07-02 DIAGNOSIS — Z23 Encounter for immunization: Secondary | ICD-10-CM | POA: Diagnosis not present

## 2017-07-02 DIAGNOSIS — D631 Anemia in chronic kidney disease: Secondary | ICD-10-CM | POA: Diagnosis not present

## 2017-07-02 DIAGNOSIS — F419 Anxiety disorder, unspecified: Secondary | ICD-10-CM | POA: Insufficient documentation

## 2017-07-02 DIAGNOSIS — Z992 Dependence on renal dialysis: Secondary | ICD-10-CM | POA: Diagnosis not present

## 2017-07-02 DIAGNOSIS — D509 Iron deficiency anemia, unspecified: Secondary | ICD-10-CM | POA: Diagnosis not present

## 2017-07-02 DIAGNOSIS — N2581 Secondary hyperparathyroidism of renal origin: Secondary | ICD-10-CM | POA: Diagnosis not present

## 2017-07-02 NOTE — Progress Notes (Signed)
I have reviewed this encounter including the documentation in this note and/or discussed this patient with the provider, Cassell Smiles, AGPCNP-BC. I am certifying that I agree with the content of this note as supervising physician.  Nobie Putnam, [facility] 07/02/2017, 9:59 PM

## 2017-07-02 NOTE — Assessment & Plan Note (Addendum)
Pt describes situational anxiety worse w/ dialysis treatments 3x weekly.  Pt prefers shorter acting medication over long acting med.  Is concerned about Lexapro side effects.  Plan: 1. Reviewed safety profile of benzos.  Discussed other options would be better choices.  Pt will agree to take buspar. 2. Continue Lexapro.  Discussed side effects and benefits.  Pt agrees to continue, but is still concerned about anxiety during treatments. 3. Start buspirone 5 mg once daily for 1 week.  Increase to twice daily for 1 week.  If effective at lower dose, may take lower dose. 4. Follow up 4-6 weeks.

## 2017-07-02 NOTE — Assessment & Plan Note (Signed)
Uncontrolled.  Pt reports higher BP during dialysis - Per nephrologist diltiazem not effective after dialysis and ordered valsartan 80 mg once daily w/o initiation.  Plan: 1. Pt should start valsartan 80 mg once daily.  If needed, reduce diltiazem dose. 2. Discussed goal BP. 3. Follow up as needed and in 3 months.

## 2017-07-05 DIAGNOSIS — Z992 Dependence on renal dialysis: Secondary | ICD-10-CM | POA: Diagnosis not present

## 2017-07-05 DIAGNOSIS — N2581 Secondary hyperparathyroidism of renal origin: Secondary | ICD-10-CM | POA: Diagnosis not present

## 2017-07-05 DIAGNOSIS — N186 End stage renal disease: Secondary | ICD-10-CM | POA: Diagnosis not present

## 2017-07-05 DIAGNOSIS — Z23 Encounter for immunization: Secondary | ICD-10-CM | POA: Diagnosis not present

## 2017-07-05 DIAGNOSIS — D509 Iron deficiency anemia, unspecified: Secondary | ICD-10-CM | POA: Diagnosis not present

## 2017-07-05 DIAGNOSIS — D631 Anemia in chronic kidney disease: Secondary | ICD-10-CM | POA: Diagnosis not present

## 2017-07-06 DIAGNOSIS — Z794 Long term (current) use of insulin: Secondary | ICD-10-CM | POA: Diagnosis not present

## 2017-07-06 DIAGNOSIS — L851 Acquired keratosis [keratoderma] palmaris et plantaris: Secondary | ICD-10-CM | POA: Diagnosis not present

## 2017-07-06 DIAGNOSIS — E1140 Type 2 diabetes mellitus with diabetic neuropathy, unspecified: Secondary | ICD-10-CM | POA: Diagnosis not present

## 2017-07-06 DIAGNOSIS — B351 Tinea unguium: Secondary | ICD-10-CM | POA: Diagnosis not present

## 2017-07-07 DIAGNOSIS — N2581 Secondary hyperparathyroidism of renal origin: Secondary | ICD-10-CM | POA: Diagnosis not present

## 2017-07-07 DIAGNOSIS — D509 Iron deficiency anemia, unspecified: Secondary | ICD-10-CM | POA: Diagnosis not present

## 2017-07-07 DIAGNOSIS — D631 Anemia in chronic kidney disease: Secondary | ICD-10-CM | POA: Diagnosis not present

## 2017-07-07 DIAGNOSIS — Z23 Encounter for immunization: Secondary | ICD-10-CM | POA: Diagnosis not present

## 2017-07-07 DIAGNOSIS — N186 End stage renal disease: Secondary | ICD-10-CM | POA: Diagnosis not present

## 2017-07-07 DIAGNOSIS — Z992 Dependence on renal dialysis: Secondary | ICD-10-CM | POA: Diagnosis not present

## 2017-07-08 ENCOUNTER — Ambulatory Visit: Payer: Self-pay | Admitting: Nurse Practitioner

## 2017-07-09 DIAGNOSIS — N2581 Secondary hyperparathyroidism of renal origin: Secondary | ICD-10-CM | POA: Diagnosis not present

## 2017-07-09 DIAGNOSIS — D631 Anemia in chronic kidney disease: Secondary | ICD-10-CM | POA: Diagnosis not present

## 2017-07-09 DIAGNOSIS — D509 Iron deficiency anemia, unspecified: Secondary | ICD-10-CM | POA: Diagnosis not present

## 2017-07-09 DIAGNOSIS — Z992 Dependence on renal dialysis: Secondary | ICD-10-CM | POA: Diagnosis not present

## 2017-07-09 DIAGNOSIS — Z23 Encounter for immunization: Secondary | ICD-10-CM | POA: Diagnosis not present

## 2017-07-09 DIAGNOSIS — N186 End stage renal disease: Secondary | ICD-10-CM | POA: Diagnosis not present

## 2017-07-12 DIAGNOSIS — Z23 Encounter for immunization: Secondary | ICD-10-CM | POA: Diagnosis not present

## 2017-07-12 DIAGNOSIS — N186 End stage renal disease: Secondary | ICD-10-CM | POA: Diagnosis not present

## 2017-07-12 DIAGNOSIS — D631 Anemia in chronic kidney disease: Secondary | ICD-10-CM | POA: Diagnosis not present

## 2017-07-12 DIAGNOSIS — Z992 Dependence on renal dialysis: Secondary | ICD-10-CM | POA: Diagnosis not present

## 2017-07-12 DIAGNOSIS — N2581 Secondary hyperparathyroidism of renal origin: Secondary | ICD-10-CM | POA: Diagnosis not present

## 2017-07-12 DIAGNOSIS — D509 Iron deficiency anemia, unspecified: Secondary | ICD-10-CM | POA: Diagnosis not present

## 2017-07-12 LAB — BASIC METABOLIC PANEL
CREATININE: 6.4 — AB (ref <=1.1)
SODIUM: 100 — AB (ref 137–147)

## 2017-07-12 LAB — HEMOGLOBIN A1C: Hemoglobin A1C: 8.3

## 2017-07-12 LAB — CBC AND DIFFERENTIAL
HCT: 37 (ref 36–46)
HEMOGLOBIN: 11.2 — AB (ref 12.0–16.0)

## 2017-07-14 DIAGNOSIS — N2581 Secondary hyperparathyroidism of renal origin: Secondary | ICD-10-CM | POA: Diagnosis not present

## 2017-07-14 DIAGNOSIS — D631 Anemia in chronic kidney disease: Secondary | ICD-10-CM | POA: Diagnosis not present

## 2017-07-14 DIAGNOSIS — Z992 Dependence on renal dialysis: Secondary | ICD-10-CM | POA: Diagnosis not present

## 2017-07-14 DIAGNOSIS — N186 End stage renal disease: Secondary | ICD-10-CM | POA: Diagnosis not present

## 2017-07-14 DIAGNOSIS — D509 Iron deficiency anemia, unspecified: Secondary | ICD-10-CM | POA: Diagnosis not present

## 2017-07-14 DIAGNOSIS — Z23 Encounter for immunization: Secondary | ICD-10-CM | POA: Diagnosis not present

## 2017-07-16 DIAGNOSIS — Z23 Encounter for immunization: Secondary | ICD-10-CM | POA: Diagnosis not present

## 2017-07-16 DIAGNOSIS — N186 End stage renal disease: Secondary | ICD-10-CM | POA: Diagnosis not present

## 2017-07-16 DIAGNOSIS — N2581 Secondary hyperparathyroidism of renal origin: Secondary | ICD-10-CM | POA: Diagnosis not present

## 2017-07-16 DIAGNOSIS — D631 Anemia in chronic kidney disease: Secondary | ICD-10-CM | POA: Diagnosis not present

## 2017-07-16 DIAGNOSIS — Z992 Dependence on renal dialysis: Secondary | ICD-10-CM | POA: Diagnosis not present

## 2017-07-16 DIAGNOSIS — D509 Iron deficiency anemia, unspecified: Secondary | ICD-10-CM | POA: Diagnosis not present

## 2017-07-19 DIAGNOSIS — Z992 Dependence on renal dialysis: Secondary | ICD-10-CM | POA: Diagnosis not present

## 2017-07-19 DIAGNOSIS — Z23 Encounter for immunization: Secondary | ICD-10-CM | POA: Diagnosis not present

## 2017-07-19 DIAGNOSIS — D509 Iron deficiency anemia, unspecified: Secondary | ICD-10-CM | POA: Diagnosis not present

## 2017-07-19 DIAGNOSIS — N2581 Secondary hyperparathyroidism of renal origin: Secondary | ICD-10-CM | POA: Diagnosis not present

## 2017-07-19 DIAGNOSIS — N186 End stage renal disease: Secondary | ICD-10-CM | POA: Diagnosis not present

## 2017-07-19 DIAGNOSIS — D631 Anemia in chronic kidney disease: Secondary | ICD-10-CM | POA: Diagnosis not present

## 2017-07-20 ENCOUNTER — Encounter: Payer: Self-pay | Admitting: Nurse Practitioner

## 2017-07-20 ENCOUNTER — Other Ambulatory Visit: Payer: Self-pay

## 2017-07-20 ENCOUNTER — Ambulatory Visit (INDEPENDENT_AMBULATORY_CARE_PROVIDER_SITE_OTHER): Payer: Medicare Other | Admitting: Nurse Practitioner

## 2017-07-20 VITALS — BP 153/69 | HR 83 | Temp 98.3°F | Ht 67.0 in | Wt 203.6 lb

## 2017-07-20 DIAGNOSIS — E1169 Type 2 diabetes mellitus with other specified complication: Secondary | ICD-10-CM

## 2017-07-20 DIAGNOSIS — I10 Essential (primary) hypertension: Secondary | ICD-10-CM

## 2017-07-20 DIAGNOSIS — K580 Irritable bowel syndrome with diarrhea: Secondary | ICD-10-CM | POA: Diagnosis not present

## 2017-07-20 DIAGNOSIS — Z992 Dependence on renal dialysis: Secondary | ICD-10-CM | POA: Diagnosis not present

## 2017-07-20 DIAGNOSIS — M545 Low back pain: Secondary | ICD-10-CM

## 2017-07-20 DIAGNOSIS — G8929 Other chronic pain: Secondary | ICD-10-CM | POA: Diagnosis not present

## 2017-07-20 DIAGNOSIS — N186 End stage renal disease: Secondary | ICD-10-CM | POA: Diagnosis not present

## 2017-07-20 DIAGNOSIS — Z794 Long term (current) use of insulin: Secondary | ICD-10-CM

## 2017-07-20 DIAGNOSIS — F341 Dysthymic disorder: Secondary | ICD-10-CM | POA: Diagnosis not present

## 2017-07-20 NOTE — Patient Instructions (Addendum)
Carman, Thank you for coming in to clinic today.  1. For your blood pressure: - BP goals are less than 130/80 and higher than 100/60 - Take diltiazem 120 mg one tablet twice daily and start taking irbesartan 150 mg  2. For your blood sugars: - Lantus 15 units once daily (same time each day). - Humalog (until switch - continue NovolinR):  Breakfast 2 units Lunch 3-5 units depending on carbs (1 serving 3 units, 3 servings or more 5 units) Dinner 3-5 units like lunch  Check blood sugar before meals and before dosing mealtime insulin.  If less than 100, do not give any insulin.  Keep your log of sugars and blood pressures and bring to clinic at next visit.  - We are also [facility] you [facility]  Otila Kluver will call you to schedule.  For your emotional support animal paperwork:  Visit [email or web address] Let me know what you need for paperwork and fax or mail to clinic.  Please schedule a follow-up appointment with Cassell Smiles, AGNP. Return in about 4 weeks (around 08/17/2017) for diabetes.  If you have any other questions or concerns, please feel free to call the clinic or send a message through [place]. You may also schedule an earlier appointment if necessary.  You will receive a survey after today's visit either digitally by e-mail or paper by [employer]. Your experiences and feedback matter to [place].  Please respond so we know how we are doing as we provide care for you.   Cassell Smiles, DNP, AGNP-BC Adult Gerontology Nurse Practitioner [place]

## 2017-07-20 NOTE — Assessment & Plan Note (Signed)
Uncontrolled.  Pt reports higher BP during dialysis.  Has not started valsartan r/t recall.  Now has prescription from nephrologist for irbesartan, but has not started it either.  Instead, has taken diltiazem 24 hr tablet 1 in am, 2 in pm per nephrology.  Plan: 1. Pt should start irbesartan 150 mg once daily.  Reduce diltiazem dose to 1 in am and 1 in pm after starting. 2. Discussed goal BP. 3. Follow up as needed and in 3 months.  Call if any difficulties w/ medication.

## 2017-07-20 NOTE — Assessment & Plan Note (Addendum)
Uncontrolled w/ A1c increase in 2 months from 8.0 in May 2018 to 8.3% in July 2018. Pt self resumed lantus insulin.  Is also taking NovolinR.  Presumed reactive hyperglycemia after initiating these insulin doses w/ range of am CBG 79-292.  Plan: 1. Change mealtime insulin dose to humalog 3-5 units each meal depending on size of meal and carbohydrates consumed (specific instructions provided to pt). - Hold humalog if preprandial glucose is less than 100. 2. Change lantus to 15 units once daily. 3. Reviewed need to check CBG prior to meals.  Keep log and bring to clinic. 4. Reviewed hypoglycemia, need to dose only insulin that is prescribed.  May need endocrinology appointment in future if no improvement w/ close follow up here. 5. Diabetes Education referral placed - discuss carb counting (pt states is currently dosing mealtime insulin based on what she thinks she is eating for carb servings. 6. Follow up 4 weeks.

## 2017-07-20 NOTE — Assessment & Plan Note (Signed)
Pt requests pain clinic referral

## 2017-07-20 NOTE — Progress Notes (Signed)
I have reviewed this encounter including the documentation in this note and/or discussed this patient with the provider, Cassell Smiles, AGPCNP-BC. I am certifying that I agree with the content of this note as supervising physician.  Nobie Putnam, [facility] 07/20/2017, 5:35 PM

## 2017-07-20 NOTE — Progress Notes (Signed)
Subjective:    Patient ID: [name], female    DOB: November 15, 1966, 51 y.o.   MRN: [id number]  WESTON FULCO is a 51 y.o. female presenting on 07/20/2017 for Hypertension (pt would like to get her animal certified as a service animal.) and Back Pain (chronic neck, back pain. would like a referral to pain management )   HPI Hypertension She is checking BP at home.  Readings 150/90 117/79 at dialysis   Current medications: diltiazem 120 mg (2 tablets at night and 1 tablet day) w/ instructions by Dr. [name] to change valsartan to irbesartan 150 mg nightly.  She is not taking the irbesartan yet.  Otherwise tolerating well without side effects.    Pt denies lightheadedness, dizziness, changes in vision, chest tightness/pressure, palpitations, leg swelling, sudden loss of speech or loss of consciousness.  She is symptomatic with headache, had one incident of near falling w/ loss of balance.   Diabetes Blood sugars had increased to > 300 on regular basis, so pt self resumed Lantus at former PCP prescribed doses. Pt is taking lantus 15 units am and pm as she had been doing prior to hospitalization. hgb A1c = 8.3% July 2018  Started lantus and has had fasting am cbg 79-292 (suspect overnight hypoglycemia and reactive hyperglycemia). Pt on mealtime insulin: NovolinR-  Breakfast: 5 units for small meal, 7 for bowl of cereal. Lunch: 5 - 7 Dinner: 10 units largest meal.  Humalog had been more effective in past according to patient.   She  has symptoms of hypoglycemia near falling over in shower once day, paresthesia of the feet, visual disturbances and headaches. She denies polydipsia, polyphagia, polyuria, diaphoresis, shakiness and chills.  Clinical course has been worsening.  She  reports no regular exercise routine, but has just started walking daily to develop regular exercise routine. Her diet is moderate in salt, moderate in fat, and moderate in carbohydrates. Weight trend:  stable  PREVENTION: Eye exam current (within one year): yes Foot exam current (within one year): unknown  Recent Labs  05/19/17 1554  HGBA1C 8.0*    IBS Significantly improved with metamucil.  She takes two teaspoons in am and pm w/ water and only has 1-2 soft formed bowel movements daily.  She no longer has explosive diarrhea or urgency that prevents leaving her home.  Service Animal request Needs form to keep emotional support animal (cat) in her apartment.  Pt notes significant improvement of her anxiety with the animal in her home.  Back Pain Wants new pain clinic - Dr. Chancy Milroy - [facility].  Pt specifically states she does not want extended release opioids if not in constant pain.    Social History  Substance Use Topics  . Smoking status: Former Smoker    Packs/day: 0.25    Years: 17.00    Types: Cigarettes    Quit date: 05/21/2017  . Smokeless tobacco: Never Used  . Alcohol use No    Review of Systems Per HPI unless specifically indicated above     Objective:    BP (!) 153/69 (BP Location: Right Arm, Patient Position: Sitting, Cuff Size: Large)   Pulse 83   Temp 98.3 F (36.8 C) (Oral)   Ht 5\' 7"  (1.702 m)   Wt 203 lb 9.6 oz (92.4 kg)   SpO2 100%   BMI 31.89 kg/m   Wt Readings from Last 3 Encounters:  07/20/17 203 lb 9.6 oz (92.4 kg)  06/29/17 209 lb 9.6 oz (  95.1 kg)  05/24/17 208 lb (94.3 kg)    Physical Exam  General - obese, well-appearing, NAD HEENT - Normocephalic, atraumatic Neck - supple, non-tender, no LAD, no thyromegaly Heart - RRR, +5-6 holosystolic murmur heard over second intercostal space left sternal border Lungs - Clear throughout all lobes, no wheezing, crackles, or rhonchi. Normal work of breathing. Extremeties - non-tender, no edema, cap refill < 2 seconds, peripheral pulses intact +2 bilaterally, AV fistula left upper arm +bruit/thrill Skin - warm, dry, uremic frosts Neuro - awake, alert, oriented x3, antalgic gait pt  ambulates w/ cane Psych - Normal mood and affect, normal behavior    Results for orders placed or performed in visit on 07/01/17  CBC and differential  Result Value Ref Range   Hemoglobin 9.9 (A) 12.0 - 16.0   HCT 31 (A) 36 - 46  Basic metabolic panel  Result Value Ref Range   Creatinine 6.8 (A) 0.5 - 1.1   Potassium 4.6 3.4 - 5.3   Sodium 133 (A) 137 - 147      Assessment & Plan:   Problem List Items Addressed This Visit      Cardiovascular and Mediastinum   Essential hypertension    Uncontrolled.  Pt reports higher BP during dialysis.  Has not started valsartan r/t recall.  Now has prescription from nephrologist for irbesartan, but has not started it either.  Instead, has taken diltiazem 24 hr tablet 1 in am, 2 in pm per nephrology.  Plan: 1. Pt should start irbesartan 150 mg once daily.  Reduce diltiazem dose to 1 in am and 1 in pm after starting. 2. Discussed goal BP. 3. Follow up as needed and in 3 months.  Call if any difficulties w/ medication.        Digestive   Irritable bowel syndrome (IBS)    Significantly improved. Pt now able to leave home and have improved quality of life.  Taking metamucil 1/2 dose bid. No constipation.  Still frequent bowel movement, but soft formed without urgency.  Plan: 1. Continue metamucil 2. Follow up as needed.        Endocrine   Diabetes mellitus type 2, uncontrolled, with complications ([place]) - Primary    Uncontrolled w/ A1c increase in 2 months from 8.0 in May 2018 to 8.3% in July 2018. Pt self resumed lantus insulin.  Is also taking NovolinR.  Presumed reactive hyperglycemia after initiating these insulin doses w/ range of am CBG 79-292.  Plan: 1. Change mealtime insulin dose to humalog 3-5 units each meal depending on size of meal and carbohydrates consumed (specific instructions provided to pt). - Hold humalog if preprandial glucose is less than 100. 2. Change lantus to 15 units once daily. 3. Reviewed need to check CBG  prior to meals.  Keep log and bring to clinic. 4. Reviewed hypoglycemia, need to dose only insulin that is prescribed.  May need endocrinology appointment in future if no improvement w/ close follow up here. 5. Diabetes Education referral placed - discuss carb counting (pt states is currently dosing mealtime insulin based on what she thinks she is eating for carb servings. 6. Follow up 4 weeks.        Other   Dysthymia    Pt w/ anxiety and depression.  Requests form to be completed for emotional support animal to keep cat in home.  Plan: 1. Provided website for pt to do some research.  Willing to complete any form she needs completed. If letter sufficient, will  provide this also. 2. Follow up as needed.      Chronic bilateral low back pain    Pt requests pain clinic referral.  Plan: Referral placed to [facility] pain clinic      Relevant Medications   Oxycodone HCl 10 MG TABS   Other Relevant Orders   Ambulatory referral to Pain Clinic      Meds ordered this encounter  Medications  . Oxycodone HCl 10 MG TABS    Sig: take 1 tablet by mouth every 6 hours if needed for pain    Refill:  0     Follow up plan: Return in about 4 weeks (around 08/17/2017) for diabetes.   Cassell Smiles, DNP, AGPCNP-BC Adult Gerontology Primary Care Nurse Practitioner [facility] 07/20/2017, 12:40 PM

## 2017-07-20 NOTE — Assessment & Plan Note (Signed)
Pt w/ anxiety and depression.  Requests form to be completed for emotional support animal to keep cat in home.  Plan: 1. Provided website for pt to do some research.  Willing to complete any form she needs completed. If letter sufficient, will provide this also. 2. Follow up as needed.

## 2017-07-20 NOTE — Assessment & Plan Note (Addendum)
Significantly improved. Pt now able to leave home and have improved quality of life.  Taking metamucil 1/2 dose bid. No constipation.  Still frequent bowel movement, but soft formed without urgency.  Plan: 1. Continue metamucil 2. Follow up as needed.

## 2017-07-21 DIAGNOSIS — N186 End stage renal disease: Secondary | ICD-10-CM | POA: Diagnosis not present

## 2017-07-21 DIAGNOSIS — N2581 Secondary hyperparathyroidism of renal origin: Secondary | ICD-10-CM | POA: Diagnosis not present

## 2017-07-21 DIAGNOSIS — Z992 Dependence on renal dialysis: Secondary | ICD-10-CM | POA: Diagnosis not present

## 2017-07-21 DIAGNOSIS — D509 Iron deficiency anemia, unspecified: Secondary | ICD-10-CM | POA: Diagnosis not present

## 2017-07-21 DIAGNOSIS — D631 Anemia in chronic kidney disease: Secondary | ICD-10-CM | POA: Diagnosis not present

## 2017-07-21 DIAGNOSIS — Z23 Encounter for immunization: Secondary | ICD-10-CM | POA: Diagnosis not present

## 2017-07-26 DIAGNOSIS — Z992 Dependence on renal dialysis: Secondary | ICD-10-CM | POA: Diagnosis not present

## 2017-07-26 DIAGNOSIS — D631 Anemia in chronic kidney disease: Secondary | ICD-10-CM | POA: Diagnosis not present

## 2017-07-26 DIAGNOSIS — N2581 Secondary hyperparathyroidism of renal origin: Secondary | ICD-10-CM | POA: Diagnosis not present

## 2017-07-26 DIAGNOSIS — N186 End stage renal disease: Secondary | ICD-10-CM | POA: Diagnosis not present

## 2017-07-26 DIAGNOSIS — D509 Iron deficiency anemia, unspecified: Secondary | ICD-10-CM | POA: Diagnosis not present

## 2017-07-26 DIAGNOSIS — Z23 Encounter for immunization: Secondary | ICD-10-CM | POA: Diagnosis not present

## 2017-07-28 DIAGNOSIS — D509 Iron deficiency anemia, unspecified: Secondary | ICD-10-CM | POA: Diagnosis not present

## 2017-07-28 DIAGNOSIS — Z23 Encounter for immunization: Secondary | ICD-10-CM | POA: Diagnosis not present

## 2017-07-28 DIAGNOSIS — N2581 Secondary hyperparathyroidism of renal origin: Secondary | ICD-10-CM | POA: Diagnosis not present

## 2017-07-28 DIAGNOSIS — N186 End stage renal disease: Secondary | ICD-10-CM | POA: Diagnosis not present

## 2017-07-28 DIAGNOSIS — D631 Anemia in chronic kidney disease: Secondary | ICD-10-CM | POA: Diagnosis not present

## 2017-07-28 DIAGNOSIS — Z992 Dependence on renal dialysis: Secondary | ICD-10-CM | POA: Diagnosis not present

## 2017-07-30 DIAGNOSIS — D509 Iron deficiency anemia, unspecified: Secondary | ICD-10-CM | POA: Diagnosis not present

## 2017-07-30 DIAGNOSIS — D631 Anemia in chronic kidney disease: Secondary | ICD-10-CM | POA: Diagnosis not present

## 2017-07-30 DIAGNOSIS — Z23 Encounter for immunization: Secondary | ICD-10-CM | POA: Diagnosis not present

## 2017-07-30 DIAGNOSIS — N2581 Secondary hyperparathyroidism of renal origin: Secondary | ICD-10-CM | POA: Diagnosis not present

## 2017-07-30 DIAGNOSIS — Z992 Dependence on renal dialysis: Secondary | ICD-10-CM | POA: Diagnosis not present

## 2017-07-30 DIAGNOSIS — N186 End stage renal disease: Secondary | ICD-10-CM | POA: Diagnosis not present

## 2017-08-02 DIAGNOSIS — Z23 Encounter for immunization: Secondary | ICD-10-CM | POA: Diagnosis not present

## 2017-08-02 DIAGNOSIS — N2581 Secondary hyperparathyroidism of renal origin: Secondary | ICD-10-CM | POA: Diagnosis not present

## 2017-08-02 DIAGNOSIS — D509 Iron deficiency anemia, unspecified: Secondary | ICD-10-CM | POA: Diagnosis not present

## 2017-08-02 DIAGNOSIS — Z992 Dependence on renal dialysis: Secondary | ICD-10-CM | POA: Diagnosis not present

## 2017-08-02 DIAGNOSIS — N186 End stage renal disease: Secondary | ICD-10-CM | POA: Diagnosis not present

## 2017-08-02 DIAGNOSIS — D631 Anemia in chronic kidney disease: Secondary | ICD-10-CM | POA: Diagnosis not present

## 2017-08-04 ENCOUNTER — Telehealth: Payer: Self-pay | Admitting: Nurse Practitioner

## 2017-08-04 DIAGNOSIS — N186 End stage renal disease: Secondary | ICD-10-CM | POA: Diagnosis not present

## 2017-08-04 DIAGNOSIS — D509 Iron deficiency anemia, unspecified: Secondary | ICD-10-CM | POA: Diagnosis not present

## 2017-08-04 DIAGNOSIS — D631 Anemia in chronic kidney disease: Secondary | ICD-10-CM | POA: Diagnosis not present

## 2017-08-04 DIAGNOSIS — N2581 Secondary hyperparathyroidism of renal origin: Secondary | ICD-10-CM | POA: Diagnosis not present

## 2017-08-04 DIAGNOSIS — Z992 Dependence on renal dialysis: Secondary | ICD-10-CM | POA: Diagnosis not present

## 2017-08-04 DIAGNOSIS — Z23 Encounter for immunization: Secondary | ICD-10-CM | POA: Diagnosis not present

## 2017-08-04 NOTE — Telephone Encounter (Signed)
Pt has a question about insulin.  Her call back number is [phone]

## 2017-08-04 NOTE — Telephone Encounter (Signed)
Prescription was called in to pt pharmacy.

## 2017-08-06 DIAGNOSIS — N2581 Secondary hyperparathyroidism of renal origin: Secondary | ICD-10-CM | POA: Diagnosis not present

## 2017-08-06 DIAGNOSIS — Z23 Encounter for immunization: Secondary | ICD-10-CM | POA: Diagnosis not present

## 2017-08-06 DIAGNOSIS — Z992 Dependence on renal dialysis: Secondary | ICD-10-CM | POA: Diagnosis not present

## 2017-08-06 DIAGNOSIS — D631 Anemia in chronic kidney disease: Secondary | ICD-10-CM | POA: Diagnosis not present

## 2017-08-06 DIAGNOSIS — N186 End stage renal disease: Secondary | ICD-10-CM | POA: Diagnosis not present

## 2017-08-06 DIAGNOSIS — D509 Iron deficiency anemia, unspecified: Secondary | ICD-10-CM | POA: Diagnosis not present

## 2017-08-09 DIAGNOSIS — Z992 Dependence on renal dialysis: Secondary | ICD-10-CM | POA: Diagnosis not present

## 2017-08-09 DIAGNOSIS — D509 Iron deficiency anemia, unspecified: Secondary | ICD-10-CM | POA: Diagnosis not present

## 2017-08-09 DIAGNOSIS — N186 End stage renal disease: Secondary | ICD-10-CM | POA: Diagnosis not present

## 2017-08-09 DIAGNOSIS — Z23 Encounter for immunization: Secondary | ICD-10-CM | POA: Diagnosis not present

## 2017-08-09 DIAGNOSIS — D631 Anemia in chronic kidney disease: Secondary | ICD-10-CM | POA: Diagnosis not present

## 2017-08-09 DIAGNOSIS — N2581 Secondary hyperparathyroidism of renal origin: Secondary | ICD-10-CM | POA: Diagnosis not present

## 2017-08-10 ENCOUNTER — Ambulatory Visit
Payer: Medicare Other | Attending: Student in an Organized Health Care Education/Training Program | Admitting: Student in an Organized Health Care Education/Training Program

## 2017-08-10 ENCOUNTER — Other Ambulatory Visit: Payer: Self-pay | Admitting: Student in an Organized Health Care Education/Training Program

## 2017-08-10 ENCOUNTER — Encounter: Payer: Self-pay | Admitting: Student in an Organized Health Care Education/Training Program

## 2017-08-10 VITALS — BP 162/75 | HR 100 | Temp 98.0°F | Resp 18 | Ht 67.0 in | Wt 208.0 lb

## 2017-08-10 DIAGNOSIS — M542 Cervicalgia: Secondary | ICD-10-CM | POA: Diagnosis not present

## 2017-08-10 DIAGNOSIS — M4802 Spinal stenosis, cervical region: Secondary | ICD-10-CM | POA: Insufficient documentation

## 2017-08-10 DIAGNOSIS — M791 Myalgia: Secondary | ICD-10-CM | POA: Diagnosis not present

## 2017-08-10 DIAGNOSIS — Z79899 Other long term (current) drug therapy: Secondary | ICD-10-CM | POA: Insufficient documentation

## 2017-08-10 DIAGNOSIS — Z888 Allergy status to other drugs, medicaments and biological substances status: Secondary | ICD-10-CM | POA: Insufficient documentation

## 2017-08-10 DIAGNOSIS — Z9049 Acquired absence of other specified parts of digestive tract: Secondary | ICD-10-CM | POA: Insufficient documentation

## 2017-08-10 DIAGNOSIS — K589 Irritable bowel syndrome without diarrhea: Secondary | ICD-10-CM | POA: Insufficient documentation

## 2017-08-10 DIAGNOSIS — N186 End stage renal disease: Secondary | ICD-10-CM | POA: Insufficient documentation

## 2017-08-10 DIAGNOSIS — L4050 Arthropathic psoriasis, unspecified: Secondary | ICD-10-CM | POA: Diagnosis not present

## 2017-08-10 DIAGNOSIS — M47812 Spondylosis without myelopathy or radiculopathy, cervical region: Secondary | ICD-10-CM | POA: Insufficient documentation

## 2017-08-10 DIAGNOSIS — M47816 Spondylosis without myelopathy or radiculopathy, lumbar region: Secondary | ICD-10-CM

## 2017-08-10 DIAGNOSIS — Z992 Dependence on renal dialysis: Secondary | ICD-10-CM | POA: Diagnosis not present

## 2017-08-10 DIAGNOSIS — R51 Headache: Secondary | ICD-10-CM | POA: Insufficient documentation

## 2017-08-10 DIAGNOSIS — E118 Type 2 diabetes mellitus with unspecified complications: Secondary | ICD-10-CM | POA: Diagnosis not present

## 2017-08-10 DIAGNOSIS — Z881 Allergy status to other antibiotic agents status: Secondary | ICD-10-CM | POA: Insufficient documentation

## 2017-08-10 DIAGNOSIS — I509 Heart failure, unspecified: Secondary | ICD-10-CM | POA: Insufficient documentation

## 2017-08-10 DIAGNOSIS — I132 Hypertensive heart and chronic kidney disease with heart failure and with stage 5 chronic kidney disease, or end stage renal disease: Secondary | ICD-10-CM | POA: Insufficient documentation

## 2017-08-10 DIAGNOSIS — E1151 Type 2 diabetes mellitus with diabetic peripheral angiopathy without gangrene: Secondary | ICD-10-CM | POA: Insufficient documentation

## 2017-08-10 DIAGNOSIS — E1122 Type 2 diabetes mellitus with diabetic chronic kidney disease: Secondary | ICD-10-CM | POA: Diagnosis not present

## 2017-08-10 DIAGNOSIS — R1084 Generalized abdominal pain: Secondary | ICD-10-CM | POA: Diagnosis not present

## 2017-08-10 DIAGNOSIS — E11621 Type 2 diabetes mellitus with foot ulcer: Secondary | ICD-10-CM | POA: Insufficient documentation

## 2017-08-10 DIAGNOSIS — E1142 Type 2 diabetes mellitus with diabetic polyneuropathy: Secondary | ICD-10-CM | POA: Diagnosis not present

## 2017-08-10 DIAGNOSIS — L97529 Non-pressure chronic ulcer of other part of left foot with unspecified severity: Secondary | ICD-10-CM | POA: Diagnosis not present

## 2017-08-10 DIAGNOSIS — M5030 Other cervical disc degeneration, unspecified cervical region: Secondary | ICD-10-CM

## 2017-08-10 DIAGNOSIS — Z940 Kidney transplant status: Secondary | ICD-10-CM | POA: Insufficient documentation

## 2017-08-10 DIAGNOSIS — F329 Major depressive disorder, single episode, unspecified: Secondary | ICD-10-CM | POA: Diagnosis not present

## 2017-08-10 DIAGNOSIS — Z9889 Other specified postprocedural states: Secondary | ICD-10-CM | POA: Insufficient documentation

## 2017-08-10 DIAGNOSIS — E7800 Pure hypercholesterolemia, unspecified: Secondary | ICD-10-CM | POA: Insufficient documentation

## 2017-08-10 DIAGNOSIS — M50222 Other cervical disc displacement at C5-C6 level: Secondary | ICD-10-CM | POA: Insufficient documentation

## 2017-08-10 DIAGNOSIS — E11311 Type 2 diabetes mellitus with unspecified diabetic retinopathy with macular edema: Secondary | ICD-10-CM | POA: Diagnosis not present

## 2017-08-10 DIAGNOSIS — E1165 Type 2 diabetes mellitus with hyperglycemia: Secondary | ICD-10-CM | POA: Insufficient documentation

## 2017-08-10 DIAGNOSIS — E875 Hyperkalemia: Secondary | ICD-10-CM | POA: Diagnosis not present

## 2017-08-10 DIAGNOSIS — M7918 Myalgia, other site: Secondary | ICD-10-CM

## 2017-08-10 DIAGNOSIS — G894 Chronic pain syndrome: Secondary | ICD-10-CM | POA: Insufficient documentation

## 2017-08-10 DIAGNOSIS — J449 Chronic obstructive pulmonary disease, unspecified: Secondary | ICD-10-CM | POA: Insufficient documentation

## 2017-08-10 DIAGNOSIS — Z794 Long term (current) use of insulin: Secondary | ICD-10-CM | POA: Insufficient documentation

## 2017-08-10 DIAGNOSIS — D649 Anemia, unspecified: Secondary | ICD-10-CM | POA: Insufficient documentation

## 2017-08-10 MED ORDER — DICLOFENAC SODIUM 1 % TD GEL: 4.0000 g | Freq: Four times a day (QID) | TRANSDERMAL | 2 refills | Status: AC

## 2017-08-10 NOTE — Patient Instructions (Signed)
It was nice meeting you today. Today we did the following: #1 referral to physical therapy/aquatic therapy. Discuss obtaining a TENS unit from them. #2. Lumbar MRI to evaluate your worsening low back and leg pain. Next line #3.  #3. Toxicology blood test #4. Referral to pain psychology regarding chronic opioid therapy. #5. Prescription for Voltaren gel to be applied to neck and low back region. #6. Follow-up with me after evaluation from pain psych. We will review your lumbar MRI and discuss treatment plan at that time including interventional procedures and medication management.

## 2017-08-10 NOTE — Progress Notes (Signed)
Safety precautions to be maintained throughout the outpatient stay will include: orient to surroundings, keep bed in low position, maintain call bell within reach at all times, provide assistance with transfer out of bed and ambulation.  

## 2017-08-10 NOTE — Progress Notes (Signed)
Patient's Name: [name]  MRN: [id number]  Referring Provider: Mikey Wilson, *  DOB: November 30, 1966  PCP: Olivia College, NP  DOS: 08/10/2017  Note by: Olivia Santa, MD  Service setting: Ambulatory outpatient  Specialty: Interventional Pain Management  Location: [name]  Visit type: Initial Patient Evaluation  Patient type: New Patient   Primary Reason(s) for Visit: Encounter for initial evaluation of one or more chronic problems (new to examiner) potentially causing chronic pain, and posing a threat to normal musculoskeletal function. (Level of risk: High) CC: Abdominal Pain (right) and Neck Pain  HPI  [name] is a 51 y.o. year old, female patient, who comes today to see [place] for the first time for an initial evaluation of her chronic pain. She has Adjustment disorder with mixed disturbance of emotions and conduct; Dysthymia; History of MRSA infection; Chronic ulcer of left foot ([name]); Acquired palmar and plantar hyperkeratosis; Essential hypertension; Tobacco abuse; ESRD (end stage renal disease) on dialysis ([name]); Diabetic macular edema ([name]); Hypertensive retinopathy of both eyes; Kidney transplant candidate; Non-proliferative diabetic retinopathy, severe, both eyes ([name]); Psoriatic arthropathy ([name]); Diabetes mellitus type 2, uncontrolled, with complications ([name]); SMA stenosis ([name]); Generalized abdominal pain; Hyperkalemia; Dialysis AV fistula malfunction ([name]); Difficulty walking; Diabetic neuropathy ([name]); Neck pain; Anxiety; Irritable bowel syndrome (IBS); and Chronic bilateral low back pain on her problem list. Today she comes in for evaluation of her Abdominal Pain (right) and Neck Pain  Pain Assessment: Location: Right Abdomen Radiating: radiates around to back Onset: More than a month ago Duration: Chronic pain Quality: Aching, Cramping (nauseating) Severity: 10-Worst pain ever/10 (self-reported pain score)  Note: Reported level is  compatible with observation.                   Effect on ADL:   Timing: Constant Modifying factors: oxycodone worked   Onset and Duration: Present longer than 3 months Cause of pain: Unknown Severity: Getting worse, NAS-11 at its worse: 10/10, NAS-11 at its best: 5/10, NAS-11 now: 10/10 and NAS-11 on the average: 9/10 Timing: Not influenced by the time of the day and During activity or exercise Aggravating Factors: Prolonged sitting, Prolonged standing and Walking Alleviating Factors: Hot packs, Lying down, Medications, Using a brace and Warm showers or baths Associated Problems: Fatigue, Inability to control bladder (urine), Nausea, Numbness, Vomiting , Weakness, Pain that wakes patient up and Pain that does not allow patient to sleep Quality of Pain: Aching, Agonizing, Cramping, Disabling, Exhausting, Feeling of constriction, Feeling of weight and Sickening Previous Examinations or Tests: MRI scan Previous Treatments: Narcotic medications  The patient comes into the clinics today for the first time for a chronic pain management evaluation.  51 year old female with a past medical history of end-stage renal disease on dialysis Monday Wednesday Friday (end-stage renal disease diagnosed February 2016) who presents with left-sided neck and right upper quadrant pain. Patient's past medical history is also significant for depression, history of MRSA infection, diabetic polyneuropathy, peripheral vascular disease. Patient is tried gabapentin and Lyrica in the past which resulted in manic-like symptoms. She cannot tolerate NSAIDs. She has tried long-acting medications in the past including fentanyl patch, OxyContin which were not effective for her pain and resulted in severe nausea and cognition disturbances.  Today I took the time to provide the patient with information regarding my pain practice. The patient was informed that my practice is divided into two sections: an interventional pain management  section, as well as a completely separate  and distinct medication management section. I explained that I have procedure days for my interventional therapies, and evaluation days for follow-ups and medication management. Because of the amount of documentation required during both, they are kept separated. This means that there is the possibility that she may be scheduled for a procedure on one day, and medication management the next. I have also informed her that because of staffing and Wilson limitations, I no longer take patients for medication management only. To illustrate the reasons for this, I gave the patient the example of surgeons, and how inappropriate it would be to refer a patient to his/her care, just to write for the post-surgical antibiotics on a surgery done by a different surgeon.   Because interventional pain management is my board-certified specialty, the patient was informed that joining my practice means that they are open to any and all interventional therapies. I made it clear that this does not mean that they will be forced to have any procedures done. What this means is that I believe interventional therapies to be essential part of the diagnosis and proper management of chronic pain conditions. Therefore, patients not interested in these interventional alternatives will be better served under the care of a different practitioner.  The patient was also made aware of my Comprehensive Pain Management Safety Guidelines where by joining my practice, they limit all of their nerve blocks and joint injections to those done by our practice, for as long as we are retained to manage their care.   Historic Controlled Substance Pharmacotherapy Review  PMP and historical list of controlled substances: Oxycodone 10 mg, quantity 20, last fill 05/21/2017 Highest opioid analgesic regimen found: Oxycodone 10 mg, quantity 125, last fill 07/10/2016 Current opioid analgesics: None MME/day: 0  mg/day Medications: The patient did not bring the medication(s) to the appointment, as requested in our "New Patient Package" Pharmacodynamics: Desired effects: Analgesia: The patient reports >50% benefit. Reported improvement in function: The patient reports medication allows her to accomplish basic ADLs. Clinically meaningful improvement in function (CMIF): Sustained CMIF goals met Perceived effectiveness: Described as relatively effective, allowing for increase in activities of daily living (ADL) Undesirable effects: Side-effects or Adverse reactions: None reported Historical Monitoring: The patient  reports that she does not use drugs. List of all UDS Test(s): Lab Results  Component Value Date   MDMA NONE DETECTED 12/16/2016   MDMA NONE DETECTED 07/13/2016   MDMA NONE DETECTED 05/11/2016   MDMA NONE DETECTED 10/11/2015   COCAINSCRNUR NONE DETECTED 12/16/2016   COCAINSCRNUR NONE DETECTED 07/13/2016   COCAINSCRNUR POSITIVE (A) 05/11/2016   COCAINSCRNUR NONE DETECTED 10/11/2015   PCPSCRNUR NONE DETECTED 12/16/2016   PCPSCRNUR NONE DETECTED 07/13/2016   PCPSCRNUR NONE DETECTED 05/11/2016   PCPSCRNUR NONE DETECTED 10/11/2015   THCU NONE DETECTED 12/16/2016   THCU NONE DETECTED 07/13/2016   THCU NONE DETECTED 05/11/2016   THCU NONE DETECTED 10/11/2015   ETH <5 10/11/2015   List of all Serum Drug Screening Test(s):  No results found for: AMPHSCRSER, BARBSCRSER, BENZOSCRSER, COCAINSCRSER, PCPSCRSER, PCPQUANT, THCSCRSER, CANNABQUANT, OPIATESCRSER, OXYSCRSER, PROPOXSCRSER Historical Background Evaluation: [place] PDMP: Six (6) year initial data search conducted.             [place] Department of public safety, offender search: Editor, commissioning Information) Non-contributory Risk Assessment Profile: Aberrant behavior: None observed or detected today Risk factors for fatal opioid overdose: None identified today Fatal overdose hazard ratio (HR): Calculation deferred Non-fatal overdose hazard ratio (HR):  Calculation deferred Risk of opioid abuse or dependence:  0.7-3.0% with doses ? 36 MME/day and 6.1-26% with doses ? 120 MME/day. Substance use disorder (SUD) risk level: Pending results of Medical Psychology Evaluation for SUD Opioid risk tool (ORT) (Total Score): 1     Opioid Risk Tool - 08/10/17 0908      Family History of Substance Abuse   Alcohol Negative   Illegal Drugs Negative   Rx Drugs Negative     Personal History of Substance Abuse   Alcohol Negative   Illegal Drugs Negative   Rx Drugs Negative     Age   Age between 13-45 years  No     History of Preadolescent Sexual Abuse   History of Preadolescent Sexual Abuse Negative or Female     Psychological Disease   Psychological Disease Negative   Depression Positive     Total Score   Opioid Risk Tool Scoring 1   Opioid Risk Interpretation Low Risk     ORT Scoring interpretation table:  Score <3 = Low Risk for SUD  Score between 4-7 = Moderate Risk for SUD  Score >8 = High Risk for Opioid Abuse    Pharmacologic Plan: Pending ordered tests and/or consults            Initial impression: Pending review of available data and ordered tests.  Meds   Current Meds  Medication Sig  . b complex-vitamin c-folic acid (NEPHRO-VITE) 0.8 MG TABS tablet Take 1 tablet by mouth daily. On dialysis days takes in evening and on non-dialysis days morning  . calcium acetate (PHOSLO) 667 MG capsule Take 1,334 mg by mouth 3 (three) times daily with meals.   . clobetasol ointment (TEMOVATE) 2.09 % Apply 1 application topically daily as needed (for psorasis).   Marland Kitchen diltiazem (TIAZAC) 120 MG 24 hr capsule Take 120 mg by mouth 2 (two) times daily.  . insulin aspart (NOVOLOG) 100 UNIT/ML injection Inject 0-9 Units into the skin 3 (three) times daily with meals.  . insulin glargine (LANTUS) 100 UNIT/ML injection Inject 15 Units into the skin at bedtime.  . midodrine (PROAMATINE) 5 MG tablet Take 5 mg by mouth as needed (when blood pressure is  low).  . promethazine (PHENERGAN) 25 MG tablet Take 1 tablet (25 mg total) by mouth every 4 (four) hours as needed for nausea or vomiting.  . psyllium (METAMUCIL SMOOTH TEXTURE) 28 % packet Take 1/4 to 1/2 packet twice daily.  . sevelamer carbonate (RENVELA) 800 MG tablet Take 1,600 mg by mouth 3 (three) times daily with meals.    Imaging Review  Cervical Imaging: Cervical MR wo contrast:  Results for orders placed during the hospital encounter of 12/17/16  MR CERVICAL SPINE WO CONTRAST   Narrative CLINICAL DATA:  Recurrent headache  EXAM: MRI CERVICAL SPINE WITHOUT CONTRAST  TECHNIQUE: Multiplanar, multisequence MR imaging of the cervical spine was performed. No intravenous contrast was administered.  COMPARISON:  None.  FINDINGS: Alignment: Physiologic.  Vertebrae: No fracture, evidence of discitis, or bone lesion.  Cord: Normal signal and morphology.  Posterior Fossa, vertebral arteries, paraspinal tissues: Negative.  Disc levels:  Discs: Mild degenerative disc disease and disc height loss at C5-6.  C2-3: No significant disc bulge. No neural foraminal stenosis. No central canal stenosis.  C3-4: No significant disc bulge. No neural foraminal stenosis. No central canal stenosis.  C4-5: Right foraminal disc protrusion with moderate stenosis. No neural foraminal stenosis. No central canal stenosis.  C5-6: Broad central disc protrusion mildly impressing on the ventral cervical spinal cord. Moderate spinal  stenosis. No neural foraminal stenosis.  C6-7: Mild broad-based disc bulge. No neural foraminal stenosis. No central canal stenosis.  C7-T1: No significant disc bulge. No neural foraminal stenosis. No central canal stenosis.  IMPRESSION: 1. At C4-5 there is a right foraminal disc protrusion with moderate foraminal stenosis. 2. At C5-6 there is a broad central disc protrusion mildly impressing on the ventral cervical spinal cord. Moderate  spinal stenosis.   Electronically Signed   By: Kathreen Devoid   On: 12/17/2016 12:36      Thoracic Imaging: Thoracic MR wo contrast: No results found for this or any previous visit. Thoracic MR wo contrast:  Results for orders placed in visit on 03/31/13  MR [place] W/O Cm   Narrative    PRIOR REPORT IMPORTED FROM THE [facility] WORKFLOW SYSTEM   REASON FOR EXAM:    weakness and numbness for mid thoracic spine downwards  COMMENTS:   PROCEDURE:     MR  - MR THORACIC SPINE WO  - Apr 01 2013  2:17PM   RESULT:     Comparison: None.   Technique: Standard thoracic spine protocol, without intravenous contrast.   Findings:  The images are degraded by patient motion artifact. There is a small  hemangioma in the T11 vertebral body. There is mild chronic appearing  height  loss of the T11 and T12 vertebral bodies. The spinal cord is normal in  caliber and signal. Minimal posterior disc bulge is seen at T5-T6. There  is  a small left pleural effusion and trace right pleural effusion.   IMPRESSION:  1. Mild chronic appearing anterior height loss of the T11 and T12  vertebral  bodies.  2. Small left, and trace right, pleural effusions.   Dictation Site: 8        Complexity Note: Imaging results reviewed. Results discussed using Layman's terms.               ROS  Cardiovascular History: High blood pressure and Heart murmur Pulmonary or Respiratory History: Smoking and Coughing up mucus (Bronchitis) Neurological History: Abnormal skin sensations (Peripheral Neuropathy), Curved spine and Incontinence:  Urinary Review of Past Neurological Studies:  Results for orders placed or performed during the hospital encounter of 04/09/17  CT HEAD WO CONTRAST   Narrative   CLINICAL DATA:  Headache, on dialysis, vomited today  EXAM: CT HEAD WITHOUT CONTRAST  TECHNIQUE: Contiguous axial images were obtained from the base of the skull through the vertex without intravenous  contrast.  COMPARISON:  07/14/2016  FINDINGS: Brain: No intracranial hemorrhage, mass effect or midline shift. No acute cortical infarction. No mass lesion is noted on this unenhanced scan.  Vascular: Mild atherosclerotic calcifications of carotid siphon.  Skull: No skull fracture.  Sinuses/Orbits: No acute findings.  Other: None  IMPRESSION: No acute intracranial abnormality. No definite acute cortical infarction.   Electronically Signed   By: Lahoma Crocker M.D.   On: 04/09/2017 16:13    Psychological-Psychiatric History: Anxiousness Gastrointestinal History: Alternating episodes iof diarrhea and constipation (IBS-Irritable bowe syndrome) Genitourinary History: Difficulty producing urine (Renal failure) Hematological History: Weakness due to low blood hemoglobin or red blood cell count (Anemia) Endocrine History: High blood sugar requiring insulin (IDDM) Rheumatologic History: No reported rheumatological signs and symptoms such as fatigue, joint pain, tenderness, swelling, redness, heat, stiffness, decreased range of motion, with or without associated rash Musculoskeletal History: Negative for myasthenia gravis, muscular dystrophy, multiple sclerosis or malignant hyperthermia Work History: Disabled  Allergies  Ms. Pedrosa is  allergic to cinnamon; cinoxate; garlic; tylenol [acetaminophen]; ciprofloxacin; onion; and prednisone.  Laboratory Chemistry  Inflammation Markers (CRP: Acute Phase) (ESR: Chronic Phase) Lab Results  Component Value Date   CRP <0.8 12/16/2016                 Renal Function Markers Lab Results  Component Value Date   BUN 65 (H) 05/24/2017   CREATININE 6.4 (A) 07/12/2017   GFRAA 7 (L) 05/24/2017   GFRNONAA 6 (L) 05/24/2017                 Hepatic Function Markers Lab Results  Component Value Date   AST 26 05/24/2017   ALT 9 (L) 05/24/2017   ALBUMIN 3.3 (L) 05/24/2017   ALKPHOS 76 05/24/2017   HCVAB >11.0 (H) 12/16/2016                  Electrolytes Lab Results  Component Value Date   NA 100 (A) 07/12/2017   K 4.6 06/25/2017   CL 97 (L) 05/24/2017   CALCIUM 7.0 (L) 05/24/2017   MG 1.7 (L) 12/29/2012                 Neuropathy Markers No results found for: [id number]               Bone Pathology Markers Lab Results  Component Value Date   ALKPHOS 76 05/24/2017   CALCIUM 7.0 (L) 05/24/2017                 Coagulation Parameters Lab Results  Component Value Date   INR 0.94 05/19/2017   LABPROT 12.6 05/19/2017   APTT 56 (H) 05/19/2017   PLT 149 (L) 05/24/2017                 Cardiovascular Markers Lab Results  Component Value Date   BNP 2,289 (H) 04/13/2013   HGB 11.2 (A) 07/12/2017   HCT 37 07/12/2017                 Note: Lab results reviewed.  [place]  Drug: Ms. Fannin  reports that she does not use drugs. Alcohol:  reports that she does not drink alcohol. Tobacco:  reports that she quit smoking about 2 months ago. Her smoking use included Cigarettes. She has a 4.25 pack-year smoking history. She has never used smokeless tobacco. Medical:  has a past medical history of Allergy; Anemia; Anxiety; Arthritis; Ataxia; CHF (congestive heart failure) ([place]); Chronic kidney disease; COPD (chronic obstructive pulmonary disease) ([place]); Depression; Diabetes mellitus without complication ([place]); Emphysema of lung ([place]); End stage renal disease ([place]); Headache; Hepatitis (2003); Hypercholesterolemia; Hypertension; Kidney disease; Peripheral vascular disease ([place]); Pneumonia (2015); Psoriasis; and Tobacco dependence. Family: family history includes Heart disease in her mother; Hypertension in her father and mother; Skin cancer in her father.  Past Surgical History:  Procedure Laterality Date  . AV FISTULA PLACEMENT Left 11/2014  . CESAREAN SECTION    . CHOLECYSTECTOMY    . cyst removed  from left hand Left 1989  . DIALYSIS/PERMA CATHETER INSERTION N/A 05/20/2017   Procedure: Dialysis/Perma Catheter Insertion and  fistulagram/LUE angiogram;  Surgeon: Algernon Huxley, MD;  Location: [facility];  Service: Cardiovascular;  Laterality: N/A;  . INCISION AND DRAINAGE ABSCESS N/A 07/16/2016   Procedure: INCISION AND DRAINAGE ABSCESS;  Surgeon: Carloyn Manner, MD;  Location: [facility];  Service: ENT;  Laterality: N/A;  . PERIPHERAL VASCULAR CATHETERIZATION N/A 07/25/2015   Procedure: A/V Shuntogram/Fistulagram;  Surgeon: Corene Cornea  Bunnie Domino, MD;  Location: [facility];  Service: Cardiovascular;  Laterality: N/A;  . PERIPHERAL VASCULAR CATHETERIZATION Left 07/25/2015   Procedure: A/V Shunt Intervention;  Surgeon: Algernon Huxley, MD;  Location: [facility];  Service: Cardiovascular;  Laterality: Left;  . PERIPHERAL VASCULAR CATHETERIZATION Left 10/07/2015   Procedure: A/V Shuntogram/Fistulagram;  Surgeon: Algernon Huxley, MD;  Location: [facility];  Service: Cardiovascular;  Laterality: Left;  . PERIPHERAL VASCULAR CATHETERIZATION N/A 10/07/2015   Procedure: A/V Shunt Intervention;  Surgeon: Algernon Huxley, MD;  Location: [facility];  Service: Cardiovascular;  Laterality: N/A;  . PERIPHERAL VASCULAR CATHETERIZATION  10/07/2015   Procedure: Dialysis/Perma Catheter Insertion;  Surgeon: Algernon Huxley, MD;  Location: [facility];  Service: Cardiovascular;;  . PERIPHERAL VASCULAR CATHETERIZATION N/A 12/17/2015   Procedure: Dialysis/Perma Catheter Removal;  Surgeon: Katha Cabal, MD;  Location: [facility];  Service: Cardiovascular;  Laterality: N/A;  . PERIPHERAL VASCULAR CATHETERIZATION N/A 01/11/2017   Procedure: Visceral Angiography;  Surgeon: Algernon Huxley, MD;  Location: [facility];  Service: Cardiovascular;  Laterality: N/A;  . PERIPHERAL VASCULAR CATHETERIZATION N/A 01/11/2017   Procedure: Visceral Artery Intervention;  Surgeon: Algernon Huxley, MD;  Location: [facility];  Service: Cardiovascular;  Laterality: N/A;  . rt. tubal and ovary removed    .  TONSILLECTOMY    . TUBAL LIGATION     Active Ambulatory Problems    Diagnosis Date Noted  . Adjustment disorder with mixed disturbance of emotions and conduct 05/11/2016  . Dysthymia 05/11/2016  . History of MRSA infection 07/18/2016  . Chronic ulcer of left foot ([place]) 07/18/2016  . Acquired palmar and plantar hyperkeratosis 07/18/2016  . Essential hypertension 07/18/2016  . Tobacco abuse 07/18/2016  . ESRD (end stage renal disease) on dialysis ([place]) 07/18/2016  . Diabetic macular edema ([place]) 08/24/2013  . Hypertensive retinopathy of both eyes 08/24/2013  . Kidney transplant candidate 08/11/2016  . Non-proliferative diabetic retinopathy, severe, both eyes ([place]) 08/24/2013  . Psoriatic arthropathy ([place]) 11/14/2012  . Diabetes mellitus type 2, uncontrolled, with complications ([place]) 82/42/3536  . SMA stenosis ([place]) 12/18/2016  . Generalized abdominal pain 12/31/2016  . Hyperkalemia 01/08/2017  . Dialysis AV fistula malfunction ([place]) 05/19/2017  . Difficulty walking 11/26/2016  . Diabetic neuropathy ([place]) 11/26/2016  . Neck pain 11/26/2016  . Anxiety 07/02/2017  . Irritable bowel syndrome (IBS) 07/20/2017  . Chronic bilateral low back pain 07/20/2017   Resolved Ambulatory Problems    Diagnosis Date Noted  . No Resolved Ambulatory Problems   Past Medical History:  Diagnosis Date  . Allergy   . Anemia   . Anxiety   . Arthritis   . Ataxia   . CHF (congestive heart failure) ([place])   . Chronic kidney disease   . COPD (chronic obstructive pulmonary disease) ([place])   . Depression   . Diabetes mellitus without complication ([place])   . Emphysema of lung ([place])   . End stage renal disease ([place])   . Headache   . Hepatitis 2003  . Hypercholesterolemia   . Hypertension   . Kidney disease   . Peripheral vascular disease ([place])   . Pneumonia 2015  . Psoriasis   . Tobacco dependence    Constitutional Exam  General appearance: Well nourished, well developed, and well hydrated. In no  apparent acute distress Vitals:   08/10/17 0856  BP: (!) 162/75  Pulse: 100  Resp: 18  Temp:  53 F (36.7 C)  SpO2: 99%  Weight: 208 lb (94.3 kg)  Height: '5\' 7"'$  (1.702 m)   BMI Assessment: Estimated body mass index is 32.58 kg/m as calculated from the following:   Height as of this encounter: '5\' 7"'$  (1.702 m).   Weight as of this encounter: 208 lb (94.3 kg).  BMI interpretation table: BMI level Category Range association with higher incidence of chronic pain  <18 kg/m2 Underweight   18.5-24.9 kg/m2 Ideal body weight   25-29.9 kg/m2 Overweight Increased incidence by 20%  30-34.9 kg/m2 Obese (Class I) Increased incidence by 68%  35-39.9 kg/m2 Severe obesity (Class II) Increased incidence by 136%  >40 kg/m2 Extreme obesity (Class III) Increased incidence by 254%   BMI Readings from Last 4 Encounters:  08/10/17 32.58 kg/m  07/20/17 31.89 kg/m  06/29/17 32.83 kg/m  05/24/17 32.58 kg/m   Wt Readings from Last 4 Encounters:  08/10/17 208 lb (94.3 kg)  07/20/17 203 lb 9.6 oz (92.4 kg)  06/29/17 209 lb 9.6 oz (95.1 kg)  05/24/17 208 lb (94.3 kg)  Psych/Mental status: Alert, oriented x 3 (person, place, & time)       Eyes: PERLA Respiratory: No evidence of acute respiratory distress  Cervical Spine Area Exam  Skin & Axial Inspection: No masses, redness, edema, swelling, or associated skin lesions Alignment: Symmetrical Functional ROM: Unrestricted ROM      Stability: No instability detected Muscle Tone/Strength: Functionally intact. No obvious neuro-muscular anomalies detected. Sensory (Neurological): Unimpaired Palpation: No palpable anomalies              Upper Extremity (UE) Exam    Side: Right upper extremity  Side: Left upper extremity  Skin & Extremity Inspection: Skin color, temperature, and hair growth are WNL. No peripheral edema or cyanosis. No masses, redness, swelling, asymmetry, or associated skin lesions. No contractures.  Skin & Extremity Inspection: Skin  color, temperature, and hair growth are WNL. No peripheral edema or cyanosis. No masses, redness, swelling, asymmetry, or associated skin lesions. No contractures.  Functional ROM: Unrestricted ROM          Functional ROM: Unrestricted ROM          Muscle Tone/Strength: Functionally intact. No obvious neuro-muscular anomalies detected.  Muscle Tone/Strength: Functionally intact. No obvious neuro-muscular anomalies detected.  Sensory (Neurological): Unimpaired  Sensory (Neurological): Unimpaired  Palpation: No palpable anomalies              Palpation: No palpable anomalies              Specialized Test(s): Deferred         Specialized Test(s): Deferred          Thoracic Spine Area Exam  Skin & Axial Inspection: No masses, redness, or swelling Alignment: Symmetrical Functional ROM: Unrestricted ROM Stability: No instability detected Muscle Tone/Strength: Functionally intact. No obvious neuro-muscular anomalies detected. Sensory (Neurological): Unimpaired Muscle strength & Tone: No palpable anomalies  Lumbar Spine Area Exam  Skin & Axial Inspection: No masses, redness, or swelling Alignment: Symmetrical Functional ROM: Unrestricted ROM      Stability: No instability detected Muscle Tone/Strength: Functionally intact. No obvious neuro-muscular anomalies detected. Sensory (Neurological): Unimpaired Palpation: No palpable anomalies       Provocative Tests: Lumbar Hyperextension and rotation test: evaluation deferred today       Lumbar Lateral bending test: evaluation deferred today       Patrick's Maneuver: evaluation deferred today  Gait & Posture Assessment  Ambulation: Unassisted Gait: Relatively normal for age and body habitus Posture: WNL   Lower Extremity Exam    Side: Right lower extremity  Side: Left lower extremity  Skin & Extremity Inspection: Skin color, temperature, and hair growth are WNL. No peripheral edema or cyanosis. No masses, redness, swelling,  asymmetry, or associated skin lesions. No contractures.  Skin & Extremity Inspection: Skin color, temperature, and hair growth are WNL. No peripheral edema or cyanosis. No masses, redness, swelling, asymmetry, or associated skin lesions. No contractures.  Functional ROM: Unrestricted ROM          Functional ROM: Unrestricted ROM          Muscle Tone/Strength: Functionally intact. No obvious neuro-muscular anomalies detected.  Muscle Tone/Strength: Functionally intact. No obvious neuro-muscular anomalies detected.  Sensory (Neurological): Unimpaired  Sensory (Neurological): Unimpaired  Palpation: No palpable anomalies  Palpation: No palpable anomalies   Assessment  Primary Diagnosis & Pertinent Problem List: The primary encounter diagnosis was DDD (degenerative disc disease), cervical. Diagnoses of Spondylosis of cervical region without myelopathy or radiculopathy, Cervicalgia, Uncontrolled type 2 diabetes mellitus with complication, unspecified whether long term insulin use ([place]), Diabetic polyneuropathy associated with type 2 diabetes mellitus ([place]), Lumbar spondylosis, Myofascial pain, and Chronic pain syndrome were also pertinent to this visit.  Visit Diagnosis (New problems to examiner): 1. DDD (degenerative disc disease), cervical   2. Spondylosis of cervical region without myelopathy or radiculopathy   3. Cervicalgia   4. Uncontrolled type 2 diabetes mellitus with complication, unspecified whether long term insulin use ([place])   5. Diabetic polyneuropathy associated with type 2 diabetes mellitus ([facility])   6. Lumbar spondylosis   7. Myofascial pain   8. Chronic pain syndrome    Plan of Care (Initial workup plan)  Note: Please be advised that as per protocol, today's visit has been an evaluation only. We have not taken over the patient's controlled substance management.  51 year old female with a complex past medical history including anxiety, end-stage kidney disease on dialysis, depression,  diabetes, migraines, hepatitis C, peripheral vascular disease who presents with chronic neck, right upper quadrant, and back pain. Patient previously seen by Dr. Chancy Milroy of [place] [facility]. She was previously on fentanyl patch at 23 mics has also tried oxycodone and OxyContin in the past which were not effective and resulted in nausea and mental status changes.  Patient's cervical MRI shows C4-C5 right foraminal disc protrusion and moderate foraminal stenosis and C5-C6 broad central disc protrusion compressing the ventral cervical spinal cord with moderate spinal canal stenosis.  Patient also endorses low back pain and worsening leg pain. She feels that her lower extremities are more deconditioned and she's been getting weaker. For this I will obtain a lumbar MRI. No red flags including bowel or bladder incontinence.  Plan: #1. Referral to physical therapy specifically aquatic therapy. Have also recommended she obtain TENS unit after PT evaluation. #2. Lumbar MRI without contrast given worsening low back pain and leg pain in the contest end-stage renal disease on dialysis. #3. Blood toxicology test. Expect this to be negative for any controlled substances. #4. Referral to pain psychology regarding appropriateness of chronic opioid therapy. #5. We'll turn gel that the patient can apply to her neck and right upper quadrant region.  Ordered Lab-work, Procedure(s), Referral(s), & Consult(s): Orders Placed This Encounter  Procedures  . MR LUMBAR SPINE WO CONTRAST  . Drug Screen 10 W/Conf, Serum  . Ambulatory referral to Physical Therapy  .  Ambulatory referral to Psychology   Pharmacotherapy (current): Medications ordered:  Meds ordered this encounter  Medications  . diclofenac sodium (VOLTAREN) 1 % GEL    Sig: Apply 4 g topically 4 (four) times daily.    Dispense:  100 g    Refill:  2    Do not add this medication to the electronic "Automatic Refill" notification system. Patient may  have prescription filled one day early if pharmacy is closed on scheduled refill date.   Medications administered during this visit: Ms. Vicario had no medications administered during this visit.   Pharmacological management options:  Opioid Analgesics: The patient was informed that there is no guarantee that she would be a candidate for opioid analgesics. The decision will be made following CDC guidelines. This decision will be based on the results of diagnostic studies, as well as Ms. Dalzell's risk profile. Depending UDS and pain psychology, consider oxycodone 5 mg 3 times a day when necessary   Membrane stabilizer: Tried and failed gabapentin and Lyrica resulted in severe nausea   Muscle relaxant: To be determined at a later time  NSAID: Medically contraindicated  Other analgesic(s): To be determined at a later time will try Voltaren gel today. Patient limited on analgesics given end-stage renal disease on dialysis.    Interventional management options: Ms. Schauer was informed that there is no guarantee that she would be a candidate for interventional therapies. The decision will be based on the results of diagnostic studies, as well as Ms. Cuny's risk profile.  Procedure(s) under consideration:  -Left C4, C5, C6, C7 cervical medial branch nerve blocks with goal radio frequency ablation -Cervical epidural steroid injection -Right transversus abdominis plane block with ultrasound guidance -Lumbar medial branch nerve blocks with goal radio frequency ablation -SI joint injection.  -Trigger point injections for myofascial pain of trapezius and cervical paraspinals    Provider-requested follow-up: Return in about 3 weeks (around 08/31/2017) for After Psychological evaluation.  Future Appointments Date Time Provider [place]  08/11/2017 9:00 AM Shotwell, Guadalupe Maple, RN [facility] None  08/19/2017 8:00 AM Olivia College, NP [facility] None  08/24/2017 11:00 AM [facility]    Primary Care Physician: Olivia College, NP Location: [facility] Note by: [name], M.D, Date: 08/10/2017; Time: 11:21 AM  Patient Instructions  It was nice meeting you today. Today we did the following: #1 referral to physical therapy/aquatic therapy. Discuss obtaining a TENS unit from them. #2. Lumbar MRI to evaluate your worsening low back and leg pain. Next line #3.  #3. Toxicology blood test #4. Referral to pain psychology regarding chronic opioid therapy. #5. Prescription for Voltaren gel to be applied to neck and low back region. #6. Follow-up with me after evaluation from pain psych. We will review your lumbar MRI and discuss treatment plan at that time including interventional procedures and medication management.

## 2017-08-11 ENCOUNTER — Encounter: Payer: Self-pay | Admitting: *Deleted

## 2017-08-11 ENCOUNTER — Encounter: Payer: Medicare Other | Attending: Nurse Practitioner | Admitting: *Deleted

## 2017-08-11 VITALS — BP 172/90 | Ht 67.0 in | Wt 206.4 lb

## 2017-08-11 DIAGNOSIS — Z713 Dietary counseling and surveillance: Secondary | ICD-10-CM | POA: Insufficient documentation

## 2017-08-11 DIAGNOSIS — Z23 Encounter for immunization: Secondary | ICD-10-CM | POA: Diagnosis not present

## 2017-08-11 DIAGNOSIS — E119 Type 2 diabetes mellitus without complications: Secondary | ICD-10-CM | POA: Insufficient documentation

## 2017-08-11 DIAGNOSIS — Z992 Dependence on renal dialysis: Secondary | ICD-10-CM

## 2017-08-11 DIAGNOSIS — D631 Anemia in chronic kidney disease: Secondary | ICD-10-CM | POA: Diagnosis not present

## 2017-08-11 DIAGNOSIS — E1122 Type 2 diabetes mellitus with diabetic chronic kidney disease: Secondary | ICD-10-CM

## 2017-08-11 DIAGNOSIS — Z794 Long term (current) use of insulin: Secondary | ICD-10-CM

## 2017-08-11 DIAGNOSIS — N186 End stage renal disease: Secondary | ICD-10-CM | POA: Diagnosis not present

## 2017-08-11 DIAGNOSIS — N2581 Secondary hyperparathyroidism of renal origin: Secondary | ICD-10-CM | POA: Diagnosis not present

## 2017-08-11 DIAGNOSIS — D509 Iron deficiency anemia, unspecified: Secondary | ICD-10-CM | POA: Diagnosis not present

## 2017-08-11 NOTE — Progress Notes (Signed)
Diabetes Self-Management Education  Visit Type: First/Initial  Appt. Start Time: 0850 Appt. End Time: 1020  08/11/2017  [name], identified by name and date of birth, is a 51 y.o. female with a diagnosis of Diabetes: Type 2.   ASSESSMENT  Blood pressure (!) 172/90, height 5\' 7"  (1.702 m), weight 206 lb 6.4 oz (93.6 kg). Body mass index is 32.33 kg/m.      Diabetes Self-Management Education - 08/11/17 1046      Visit Information   Visit Type First/Initial     Initial Visit   Diabetes Type Type 2   Are you currently following a meal plan? Yes   What type of meal plan do you follow? renal diet   Are you taking your medications as prescribed? Yes   Date Diagnosed 1989     Health Coping   How would you rate your overall health? Poor     Psychosocial Assessment   Patient Belief/Attitude about Diabetes Other (comment)  "diabetes doesn't bother me"   Self-care barriers None   Self-management support Friends   Other persons present Spouse/SO   Patient Concerns Nutrition/Meal planning;Glycemic Control;Weight Control   Special Needs None   Preferred Learning Style Visual   Learning Readiness Ready   How often do you need to have someone help you when you read instructions, pamphlets, or other written materials from your doctor or pharmacy? 1 - Never   What is the last grade level you completed in school? 12th grade     Pre-Education Assessment   Patient understands the diabetes disease and treatment process. Needs Review   Patient understands incorporating nutritional management into lifestyle. Needs Instruction   Patient undertands incorporating physical activity into lifestyle. Needs Review   Patient understands using medications safely. Needs Instruction   Patient understands monitoring blood glucose, interpreting and using results Needs Review   Patient understands prevention, detection, and treatment of acute complications. Needs Review   Patient understands  prevention, detection, and treatment of chronic complications. Needs Review   Patient understands how to develop strategies to address psychosocial issues. Needs Instruction   Patient understands how to develop strategies to promote health/change behavior. Needs Instruction     Complications   Last HgB A1C per patient/outside source 11.2 %  07/12/17   How often do you check your blood sugar? 1-2 times/day   Fasting Blood glucose range (mg/dL) 70-129  Pt reports FBG's 80-120 mg/dL and today was 101 mg/dL.    Postprandial Blood glucose range (mg/dL) --  She reports pre-supper readings less than 200 mg/dL.    Have you had a dilated eye exam in the past 12 months? Yes   Have you had a dental exam in the past 12 months? No   Are you checking your feet? Yes   How many days per week are you checking your feet? 7     Dietary Intake   Breakfast meatloaf sandwich with 1 piece of bread; eggs; cereal and milk; pancakes   Lunch usually skips or eats protein bar on dialysis days   Dinner chicken or fish; pasta, rice, peas, corn, egg plant, squash, broccoli   Snack (evening) dry cereal   Beverage(s) pt reports 32 oz fluid restriction per day - water, unsweetened drinks or those with artificial sweetener     Exercise   Exercise Type Light (walking / raking leaves)  walking, elastic bands   How many days per week to you exercise? 2   How many minutes per day  do you exercise? 30   Total minutes per week of exercise 60     Patient Education   Previous Diabetes Education Yes (please comment)  MD office in [place] state  Explored patient's options for treatment of their diabetes   Nutrition management  Food label reading, portion sizes and measuring food.;Reviewed blood glucose goals for pre and post meals and how to evaluate the patients' food intake on their blood glucose level.;Meal timing in regards to the patients' current diabetes medication.;Meal options for control of blood glucose level  and chronic complications.   Physical activity and exercise  Role of exercise on diabetes management, blood pressure control and cardiac health.   Medications Taught/reviewed insulin injection, site rotation, insulin storage and needle disposal.;Reviewed patients medication for diabetes, action, purpose, timing of dose and side effects.   Monitoring Purpose and frequency of SMBG.;Taught/discussed recording of test results and interpretation of SMBG.;Identified appropriate SMBG and/or A1C goals.   Acute complications Taught treatment of hypoglycemia - the 15 rule.   Chronic complications Relationship between chronic complications and blood glucose control;Identified and discussed with patient  current chronic complications   Psychosocial adjustment Identified and addressed patients feelings and concerns about diabetes   Personal strategies to promote health Review risk of smoking and offered smoking cessation     Individualized Goals (developed by patient)   Reducing Risk Improve blood sugars Lose weight Quit smoking     Outcomes   Expected Outcomes Demonstrated interest in learning. Expect positive outcomes   Future DMSE 2 wks      Individualized Plan for Diabetes Self-Management Training:   Learning Objective:  Patient will have a greater understanding of diabetes self-management. Patient education plan is to attend individual and/or group sessions per assessed needs and concerns.   Plan:   Patient Instructions  Check blood sugars 3 x day before each meal  every day Bring blood sugar records to the next class Exercise: Continue walking and using elastic bands for   30  minutes 1-2 days a week and gradually increase to 30 minutes 5 x week Eat 3 meals day,  1-2  snacks a day Space meals 4-6 hours apart Don't skip meals Quit smoking Carry fast acting glucose and a snack at all times Rotate injection sites   Expected Outcomes:  Demonstrated interest in learning. Expect positive  outcomes  Education material provided:  General Meal Planning Guidelines Simple Meal Plan 3 Day Food Record Glucose tablets Symptoms, causes and treatments of Hypoglycemia  If problems or questions, patient to contact team via:  Johny Drilling, [place], [place], CDE [phone]  Future DSME appointment: 2 wks  August 26, 2017 for Diabetes Class 1

## 2017-08-11 NOTE — Patient Instructions (Signed)
Check blood sugars 3 x day before each meal  every day Bring blood sugar records to the next class  Exercise: Continue walking and using elastic bands for   30  minutes 1-2 days a week and gradually increase to 30 minutes 5 x week  Eat 3 meals day,  1-2  snacks a day Space meals 4-6 hours apart Don't skip meals  Quit smoking  Carry fast acting glucose and a snack at all times Rotate injection sites  Return for classes on:

## 2017-08-12 LAB — DRUG SCREEN 10 W/CONF, SERUM
AMPHETAMINES, IA: NEGATIVE ng/mL
BARBITURATES, IA: NEGATIVE ug/mL
Benzodiazepines, IA: NEGATIVE ng/mL
Cocaine & Metabolite, IA: NEGATIVE ng/mL
METHADONE, IA: NEGATIVE ng/mL
OPIATES, IA: NEGATIVE ng/mL
Oxycodones, IA: NEGATIVE ng/mL
Phencyclidine, IA: NEGATIVE ng/mL
Propoxyphene, IA: NEGATIVE ng/mL
THC(MARIJUANA) METABOLITE, IA: NEGATIVE ng/mL

## 2017-08-13 DIAGNOSIS — N186 End stage renal disease: Secondary | ICD-10-CM | POA: Diagnosis not present

## 2017-08-13 DIAGNOSIS — D631 Anemia in chronic kidney disease: Secondary | ICD-10-CM | POA: Diagnosis not present

## 2017-08-13 DIAGNOSIS — N2581 Secondary hyperparathyroidism of renal origin: Secondary | ICD-10-CM | POA: Diagnosis not present

## 2017-08-13 DIAGNOSIS — D509 Iron deficiency anemia, unspecified: Secondary | ICD-10-CM | POA: Diagnosis not present

## 2017-08-13 DIAGNOSIS — Z23 Encounter for immunization: Secondary | ICD-10-CM | POA: Diagnosis not present

## 2017-08-13 DIAGNOSIS — Z992 Dependence on renal dialysis: Secondary | ICD-10-CM | POA: Diagnosis not present

## 2017-08-16 DIAGNOSIS — N186 End stage renal disease: Secondary | ICD-10-CM | POA: Diagnosis not present

## 2017-08-16 DIAGNOSIS — N2581 Secondary hyperparathyroidism of renal origin: Secondary | ICD-10-CM | POA: Diagnosis not present

## 2017-08-16 DIAGNOSIS — D509 Iron deficiency anemia, unspecified: Secondary | ICD-10-CM | POA: Diagnosis not present

## 2017-08-16 DIAGNOSIS — Z992 Dependence on renal dialysis: Secondary | ICD-10-CM | POA: Diagnosis not present

## 2017-08-16 DIAGNOSIS — Z23 Encounter for immunization: Secondary | ICD-10-CM | POA: Diagnosis not present

## 2017-08-16 DIAGNOSIS — D631 Anemia in chronic kidney disease: Secondary | ICD-10-CM | POA: Diagnosis not present

## 2017-08-18 DIAGNOSIS — N2581 Secondary hyperparathyroidism of renal origin: Secondary | ICD-10-CM | POA: Diagnosis not present

## 2017-08-18 DIAGNOSIS — D631 Anemia in chronic kidney disease: Secondary | ICD-10-CM | POA: Diagnosis not present

## 2017-08-18 DIAGNOSIS — N186 End stage renal disease: Secondary | ICD-10-CM | POA: Diagnosis not present

## 2017-08-18 DIAGNOSIS — Z23 Encounter for immunization: Secondary | ICD-10-CM | POA: Diagnosis not present

## 2017-08-18 DIAGNOSIS — Z992 Dependence on renal dialysis: Secondary | ICD-10-CM | POA: Diagnosis not present

## 2017-08-18 DIAGNOSIS — D509 Iron deficiency anemia, unspecified: Secondary | ICD-10-CM | POA: Diagnosis not present

## 2017-08-19 ENCOUNTER — Encounter: Payer: Self-pay | Admitting: Nurse Practitioner

## 2017-08-19 ENCOUNTER — Ambulatory Visit (INDEPENDENT_AMBULATORY_CARE_PROVIDER_SITE_OTHER): Payer: Medicare Other | Admitting: Nurse Practitioner

## 2017-08-19 VITALS — BP 159/69 | HR 79 | Temp 98.4°F | Ht 67.0 in | Wt 206.0 lb

## 2017-08-19 DIAGNOSIS — F064 Anxiety disorder due to known physiological condition: Secondary | ICD-10-CM | POA: Diagnosis not present

## 2017-08-19 DIAGNOSIS — E1140 Type 2 diabetes mellitus with diabetic neuropathy, unspecified: Secondary | ICD-10-CM | POA: Diagnosis not present

## 2017-08-19 DIAGNOSIS — R152 Fecal urgency: Secondary | ICD-10-CM | POA: Diagnosis not present

## 2017-08-19 DIAGNOSIS — K580 Irritable bowel syndrome with diarrhea: Secondary | ICD-10-CM | POA: Diagnosis not present

## 2017-08-19 DIAGNOSIS — E118 Type 2 diabetes mellitus with unspecified complications: Secondary | ICD-10-CM | POA: Diagnosis not present

## 2017-08-19 DIAGNOSIS — R159 Full incontinence of feces: Secondary | ICD-10-CM | POA: Insufficient documentation

## 2017-08-19 DIAGNOSIS — E1165 Type 2 diabetes mellitus with hyperglycemia: Secondary | ICD-10-CM

## 2017-08-19 MED ORDER — INSULIN REGULAR HUMAN 100 UNIT/ML IJ SOLN: 2.0000 [IU] | Freq: Three times a day (TID) | INTRAMUSCULAR | 2 refills | Status: DC

## 2017-08-19 MED ORDER — PSYLLIUM 28 % PO PACK: PACK | ORAL | 4 refills | Status: DC

## 2017-08-19 MED ORDER — GABAPENTIN 100 MG PO CAPS: 100.0000 mg | ORAL_CAPSULE | Freq: Three times a day (TID) | ORAL | 3 refills | Status: DC

## 2017-08-19 MED ORDER — GLUCOSE 4 G PO CHEW: 1.0000 | CHEWABLE_TABLET | ORAL | 12 refills | Status: DC | PRN

## 2017-08-19 NOTE — Patient Instructions (Addendum)
[name], Thank you for coming in to clinic today.  1. For your diabetes: - Decrease mealtime insulin to 2-4 units per meal.  2 units with small meal and 4 units with large meal. Because of your gastroparesis, administer your insulin when you sit down to eat.   - For your gastroparesis, try slowing your meal.  2. For your IBS - increase your metamucil to 1 full pack twice daily.  You can  Increase to 2 packs if needed, but may make gastroparesis worse.  3. For your psoriasis: - I will send a referral to either a dermatologist or rheumatologist.  I will call and leave a message to tell you where I have referred you.  Please schedule a follow-up appointment with [name], AGNP. Return in about 2 months (around 10/19/2017) for Diabetes, IBS.  If you have any other questions or concerns, please feel free to call the clinic or send a message through [place]. You may also schedule an earlier appointment if necessary.  You will receive a survey after today's visit either digitally by e-mail or paper by [employer]. Your experiences and feedback matter to [place].  Please respond so we know how we are doing as we provide care for you.    Olivia Smiles, DNP, AGNP-BC Adult Gerontology Nurse Practitioner [place]

## 2017-08-19 NOTE — Progress Notes (Signed)
Subjective:    Patient ID: [name], female    DOB: Jun 23, 1966, 51 y.o.   MRN: [id number]  [name] is a 51 y.o. female presenting on 08/19/2017 for Diabetes   HPI Diabetes Seems to be improving w/ insulin changes. Pt presents today for follow up of Type 2 diabetes mellitus.  She is checking CBG at home with a range of 60-170.  Fasting am 90-136 Breakfast pre-post 125-135; 123-98 Lunch pre-post 135-88; 60-120 Dinner pre-post 124-146; 108-129  Current diabetic medications include: lantus 15 units once daily and novolin R 3 units w/ breakfast and lunch, 5 units with dinner (largest meal of day).   She  has symptoms of nausea r/t gastroparesis, paresthesia of the feet r/t autonomic neuropathy, and foot callus. Has interrupted sleep.  Sleeps 3 hrs each episode, but wakes r/t pain in feet.  Not on gabapentin  Has had one severe low w/ diaphoresis corrected w/ glu tabs.  She denies all other polydipsia, polyphagia, polyuria, headaches, diaphoresis, shakiness, chills and changes in vision.  Clinical course has been improving.  She  reports no regular exercise routine. Her diet is low in salt, moderate in fat, and moderate in carbohydrates. Weight trend: stable  PREVENTION: Eye exam current (within one year): no Foot exam current (within one year): due today Lipid/ASCVD risk reduction - on statin: yes Kidney protection - on ace or arb: yes  Recent Labs  05/19/17 1554 07/12/17  HGBA1C 8.0* 8.3     IBS - worsening w/ diarreha and incontinence again.  Metamucil provided only short term relief. Currently taking 1/2 packet twice daily.  Wears depends for stool incontnence   Social History  Substance Use Topics  . Smoking status: Light Tobacco Smoker    Packs/day: 0.25    Years: 20.00    Types: Cigarettes    Last attempt to quit: 05/21/2017  . Smokeless tobacco: Never Used  . Alcohol use No    Review of Systems Per HPI unless specifically indicated above       Objective:    BP (!) 159/69 (BP Location: Right Arm, Patient Position: Sitting, Cuff Size: Large)   Pulse 79   Temp 98.4 F (36.9 C) (Oral)   Ht 5\' 7"  (1.702 m)   Wt 206 lb (93.4 kg)   BMI 32.26 kg/m   Wt Readings from Last 3 Encounters:  08/19/17 206 lb (93.4 kg)  08/11/17 206 lb 6.4 oz (93.6 kg)  08/10/17 208 lb (94.3 kg)    Physical Exam  General - obese w/ central adiposity, well-appearing, NAD HEENT - Normocephalic, atraumatic, PERRL, EOMI, patent nares w/o congestion, oropharynx clear, MMM Neck - supple, non-tender, no LAD Heart - RRR, no murmurs heard.  Can hear radiating bruit from LUE fistula Lungs - Clear throughout all lobes, no wheezing, crackles, or rhonchi. Normal work of breathing. Abdomen - soft, NTND, no masses, no hepatosplenomegaly, active bowel sounds Extremeties - non-tender, no edema, cap refill < 2 seconds, peripheral pulses intact +2 bilaterally, LUE fistula +bruit/-thrill aneurysmal  Skin - warm, dry, no rashes Neuro - awake, alert, oriented x3, antalgic gait walking w/ single prong cane Psych - Normal mood and affect, normal behavior    Diabetic Foot Exam - Simple   Simple Foot Form Diabetic Foot exam was performed with the following findings:  Yes 08/19/2017  8:30 AM  Visual Inspection See comments:  Yes Sensation Testing Intact to touch and monofilament testing bilaterally:  Yes Pulse Check Posterior Tibialis and Dorsalis pulse  intact bilaterally:  Yes Comments No ulceration, callus present bilateral plantar surface causing increased pain w/ ambulation. Nails long and thick.  Pt established w/ podiatry.  Will call for appointment.     Results for orders placed or performed in visit on 08/10/17  Drug Screen 10 W/Conf, Serum  Result Value Ref Range   Amphetamines, IA Negative Cutoff:50 ng/mL   Barbiturates, IA Negative Cutoff:0.1 ug/mL   Benzodiazepines, IA Negative Cutoff:20 ng/mL   Cocaine & Metabolite, IA Negative Cutoff:25 ng/mL    Phencyclidine, IA Negative Cutoff:8 ng/mL   THC(Marijuana) Metabolite, IA Negative Cutoff:5 ng/mL   Opiates, IA Negative Cutoff:5 ng/mL   Oxycodones, IA Negative Cutoff:5 ng/mL   Methadone, IA Negative Cutoff:25 ng/mL   Propoxyphene, IA Negative Cutoff:50 ng/mL      Assessment & Plan:   Problem List Items Addressed This Visit      Digestive   Irritable bowel syndrome (IBS) - Primary    Now has returned to prior baseline and unable to leave home and continue improved quality of life.  Taking metamucil 1/2 dose bid. Never constipation.  Frequent bowel movement watery w/ significant urgency.  Plan: 1. Continue metamucil. Increase to 1 full packet twice daily.  Caution w/ gastroparesis and return to low dose if worsens symptoms.   2. Pt will be having physical therapy as part of pain management program.  Consider pelvic floor PT for urgency and improving incontinence. 3. Continue w/ GI referral as planned.  Follow up as needed.      Relevant Medications   psyllium (METAMUCIL SMOOTH TEXTURE) 28 % packet   Other Relevant Orders   Ambulatory referral to Gastroenterology     Endocrine   Diabetes mellitus type 2, uncontrolled, with complications ([place])    Improving control of CBG checks.  4 weeks ago had A1c increase from 8.0 in May 2018 to 8.3% in July 2018. Pt feels well and improved over last appointment.  1 severe hypoglycemia episode resolved after glucose tabs.  Less concern for reactive hyperglycemia after review of recent CBG readings from home.  Plan: 1. DECREASE mealtime insulin dose to Novolin R 2-4 units each meal depending on size of meal.  2 units small meal/1 carb serving and 4 units large meal/2-3 carb servings. - Hold humalog if preprandial glucose is less than 100. 2. Continue lantus 15 units once daily. 3. Reviewed need to check CBG prior to meals.  Keep log and bring to clinic. 4. Reviewed hypoglycemia, need to dose only insulin that is prescribed.  May need  endocrinology appointment in future if no improvement w/ close follow up here. 5. Diabetes Education referral initial visit completed.  Classes offered and are scheduled for September. 6. Follow up 2 months and recheck A1c.      Relevant Medications   psyllium (METAMUCIL SMOOTH TEXTURE) 28 % packet   insulin regular (NOVOLIN R,HUMULIN R) 100 units/mL injection   glucose 4 GM chewable tablet   Other Relevant Orders   Ambulatory referral to Gastroenterology   Neuropathy due to type 2 diabetes mellitus ([place])    Uncontrolled. Impacting pt ability to sleep and walk without pain.  Plan: 1. Start gabapentin 100 mg three times daily OR 300 mg at bedtime.  Recommend bedtime dose since symptoms worse at night. 2. Follow up as needed and in 2 months.      Relevant Medications   insulin regular (NOVOLIN R,HUMULIN R) 100 units/mL injection   glucose 4 GM chewable tablet   gabapentin (NEURONTIN) 100  MG capsule     Other   Stool incontinence    Uncontrolled.  See A/P IBS.  Plan: 1. Refill briefs order.  Paper provided during visit, completed and faxed to medical supplier. 2. Follow up as needed.         Meds ordered this encounter  Medications  . psyllium (METAMUCIL SMOOTH TEXTURE) 28 % packet    Sig: Take 1-2 packets twice daily.    Dispense:  60 packet    Refill:  4    Order Specific Question:   Supervising Provider    Answer:   Olin Hauser [[id number]]  . insulin regular (NOVOLIN R,HUMULIN R) 100 units/mL injection    Sig: Inject 0.02-0.04 mLs (2-4 Units total) into the skin 3 (three) times daily with meals.    Dispense:  10 mL    Refill:  2    Order Specific Question:   Supervising Provider    Answer:   Olin Hauser [[id number]]  . glucose 4 GM chewable tablet    Sig: Chew 1 tablet (4 g total) by mouth as needed for low blood sugar.    Dispense:  50 tablet    Refill:  12    Order Specific Question:   Supervising Provider    Answer:   Olin Hauser  [[id number]]  . gabapentin (NEURONTIN) 100 MG capsule    Sig: Take 1 capsule (100 mg total) by mouth 3 (three) times daily. OR 3 capsules at bedtime.    Dispense:  90 capsule    Refill:  3    Order Specific Question:   Supervising Provider    Answer:   Olin Hauser [[id number]]      Follow up plan: Return in about 2 months (around 10/19/2017) for Diabetes, IBS.    Cassell Smiles, DNP, AGPCNP-BC Adult Gerontology Primary Care Nurse Practitioner [facility] 08/20/2017, 1:13 PM

## 2017-08-20 DIAGNOSIS — Z23 Encounter for immunization: Secondary | ICD-10-CM | POA: Diagnosis not present

## 2017-08-20 DIAGNOSIS — N186 End stage renal disease: Secondary | ICD-10-CM | POA: Diagnosis not present

## 2017-08-20 DIAGNOSIS — Z992 Dependence on renal dialysis: Secondary | ICD-10-CM | POA: Diagnosis not present

## 2017-08-20 DIAGNOSIS — N2581 Secondary hyperparathyroidism of renal origin: Secondary | ICD-10-CM | POA: Diagnosis not present

## 2017-08-20 DIAGNOSIS — D509 Iron deficiency anemia, unspecified: Secondary | ICD-10-CM | POA: Diagnosis not present

## 2017-08-20 DIAGNOSIS — D631 Anemia in chronic kidney disease: Secondary | ICD-10-CM | POA: Diagnosis not present

## 2017-08-20 NOTE — Progress Notes (Signed)
I have reviewed this encounter including the documentation in this note and/or discussed this patient with the provider, Cassell Smiles, AGPCNP-BC. I am certifying that I agree with the content of this note as supervising physician.  Nobie Putnam, [facility] 08/20/2017, 2:09 PM

## 2017-08-20 NOTE — Assessment & Plan Note (Addendum)
Uncontrolled.  See A/P IBS.  Plan: 1. Refill briefs order.  Paper provided during visit, completed and faxed to medical supplier. 2. Follow up as needed.

## 2017-08-20 NOTE — Assessment & Plan Note (Signed)
Uncontrolled. Impacting pt ability to sleep and walk without pain.  Plan: 1. Start gabapentin 100 mg three times daily OR 300 mg at bedtime.  Recommend bedtime dose since symptoms worse at night. 2. Follow up as needed and in 2 months.

## 2017-08-20 NOTE — Assessment & Plan Note (Signed)
Now has returned to prior baseline and unable to leave home and continue improved quality of life.  Taking metamucil 1/2 dose bid. Never constipation.  Frequent bowel movement watery w/ significant urgency.  Plan: 1. Continue metamucil. Increase to 1 full packet twice daily.  Caution w/ gastroparesis and return to low dose if worsens symptoms.   2. Pt will be having physical therapy as part of pain management program.  Consider pelvic floor PT for urgency and improving incontinence. 3. Continue w/ GI referral as planned.  Follow up as needed.

## 2017-08-20 NOTE — Assessment & Plan Note (Addendum)
Improving control of CBG checks.  4 weeks ago had A1c increase from 8.0 in May 2018 to 8.3% in July 2018. Pt feels well and improved over last appointment.  1 severe hypoglycemia episode resolved after glucose tabs.  Less concern for reactive hyperglycemia after review of recent CBG readings from home.  Plan: 1. DECREASE mealtime insulin dose to Novolin R 2-4 units each meal depending on size of meal.  2 units small meal/1 carb serving and 4 units large meal/2-3 carb servings. - Hold humalog if preprandial glucose is less than 100. 2. Continue lantus 15 units once daily. 3. Reviewed need to check CBG prior to meals.  Keep log and bring to clinic. 4. Reviewed hypoglycemia, need to dose only insulin that is prescribed.  May need endocrinology appointment in future if no improvement w/ close follow up here. 5. Diabetes Education referral initial visit completed.  Classes offered and are scheduled for September. 6. Follow up 2 months and recheck A1c.

## 2017-08-23 DIAGNOSIS — D631 Anemia in chronic kidney disease: Secondary | ICD-10-CM | POA: Diagnosis not present

## 2017-08-23 DIAGNOSIS — N2581 Secondary hyperparathyroidism of renal origin: Secondary | ICD-10-CM | POA: Diagnosis not present

## 2017-08-23 DIAGNOSIS — N186 End stage renal disease: Secondary | ICD-10-CM | POA: Diagnosis not present

## 2017-08-23 DIAGNOSIS — Z992 Dependence on renal dialysis: Secondary | ICD-10-CM | POA: Diagnosis not present

## 2017-08-23 DIAGNOSIS — D509 Iron deficiency anemia, unspecified: Secondary | ICD-10-CM | POA: Diagnosis not present

## 2017-08-24 ENCOUNTER — Ambulatory Visit: Payer: Medicare Other

## 2017-08-25 DIAGNOSIS — D631 Anemia in chronic kidney disease: Secondary | ICD-10-CM | POA: Diagnosis not present

## 2017-08-25 DIAGNOSIS — D509 Iron deficiency anemia, unspecified: Secondary | ICD-10-CM | POA: Diagnosis not present

## 2017-08-25 DIAGNOSIS — N2581 Secondary hyperparathyroidism of renal origin: Secondary | ICD-10-CM | POA: Diagnosis not present

## 2017-08-25 DIAGNOSIS — Z992 Dependence on renal dialysis: Secondary | ICD-10-CM | POA: Diagnosis not present

## 2017-08-25 DIAGNOSIS — N186 End stage renal disease: Secondary | ICD-10-CM | POA: Diagnosis not present

## 2017-08-26 ENCOUNTER — Ambulatory Visit: Payer: Medicare Other

## 2017-08-27 DIAGNOSIS — N2581 Secondary hyperparathyroidism of renal origin: Secondary | ICD-10-CM | POA: Diagnosis not present

## 2017-08-27 DIAGNOSIS — N186 End stage renal disease: Secondary | ICD-10-CM | POA: Diagnosis not present

## 2017-08-27 DIAGNOSIS — Z992 Dependence on renal dialysis: Secondary | ICD-10-CM | POA: Diagnosis not present

## 2017-08-27 DIAGNOSIS — D509 Iron deficiency anemia, unspecified: Secondary | ICD-10-CM | POA: Diagnosis not present

## 2017-08-27 DIAGNOSIS — D631 Anemia in chronic kidney disease: Secondary | ICD-10-CM | POA: Diagnosis not present

## 2017-08-30 DIAGNOSIS — N2581 Secondary hyperparathyroidism of renal origin: Secondary | ICD-10-CM | POA: Diagnosis not present

## 2017-08-30 DIAGNOSIS — N186 End stage renal disease: Secondary | ICD-10-CM | POA: Diagnosis not present

## 2017-08-30 DIAGNOSIS — D509 Iron deficiency anemia, unspecified: Secondary | ICD-10-CM | POA: Diagnosis not present

## 2017-08-30 DIAGNOSIS — D631 Anemia in chronic kidney disease: Secondary | ICD-10-CM | POA: Diagnosis not present

## 2017-08-30 DIAGNOSIS — Z992 Dependence on renal dialysis: Secondary | ICD-10-CM | POA: Diagnosis not present

## 2017-09-01 ENCOUNTER — Telehealth: Payer: Self-pay | Admitting: Nurse Practitioner

## 2017-09-01 DIAGNOSIS — N2581 Secondary hyperparathyroidism of renal origin: Secondary | ICD-10-CM | POA: Diagnosis not present

## 2017-09-01 DIAGNOSIS — N186 End stage renal disease: Secondary | ICD-10-CM | POA: Diagnosis not present

## 2017-09-01 DIAGNOSIS — Z992 Dependence on renal dialysis: Secondary | ICD-10-CM | POA: Diagnosis not present

## 2017-09-01 DIAGNOSIS — D509 Iron deficiency anemia, unspecified: Secondary | ICD-10-CM | POA: Diagnosis not present

## 2017-09-01 DIAGNOSIS — D631 Anemia in chronic kidney disease: Secondary | ICD-10-CM | POA: Diagnosis not present

## 2017-09-01 NOTE — Telephone Encounter (Signed)
Called pt to follow up.  Did not answer.  At this point, she can stop taking the gabapentin if it is causing side effects.   - Without follow up questions, I cannot recommend any other medications.

## 2017-09-01 NOTE — Telephone Encounter (Signed)
Pt said the gabapentin was causes her joints to be inflamed and hurting.  Her call back number is [phone]

## 2017-09-02 ENCOUNTER — Ambulatory Visit: Payer: Medicare Other

## 2017-09-02 NOTE — Telephone Encounter (Signed)
Have attempted to call again.  Left instructions to stop taking this medication and call back to clinic if needing another medication before seeing pain management clinic.  No further attempts will be made to contact patient. She is good about calling if needing something and has regular medical office contact w/ nephrology/dialysis.

## 2017-09-04 DIAGNOSIS — Z992 Dependence on renal dialysis: Secondary | ICD-10-CM | POA: Diagnosis not present

## 2017-09-04 DIAGNOSIS — N2581 Secondary hyperparathyroidism of renal origin: Secondary | ICD-10-CM | POA: Diagnosis not present

## 2017-09-04 DIAGNOSIS — D509 Iron deficiency anemia, unspecified: Secondary | ICD-10-CM | POA: Diagnosis not present

## 2017-09-04 DIAGNOSIS — D631 Anemia in chronic kidney disease: Secondary | ICD-10-CM | POA: Diagnosis not present

## 2017-09-04 DIAGNOSIS — N186 End stage renal disease: Secondary | ICD-10-CM | POA: Diagnosis not present

## 2017-09-06 ENCOUNTER — Encounter: Payer: Self-pay | Admitting: *Deleted

## 2017-09-08 DIAGNOSIS — Z992 Dependence on renal dialysis: Secondary | ICD-10-CM | POA: Diagnosis not present

## 2017-09-08 DIAGNOSIS — N186 End stage renal disease: Secondary | ICD-10-CM | POA: Diagnosis not present

## 2017-09-08 DIAGNOSIS — D509 Iron deficiency anemia, unspecified: Secondary | ICD-10-CM | POA: Diagnosis not present

## 2017-09-08 DIAGNOSIS — N2581 Secondary hyperparathyroidism of renal origin: Secondary | ICD-10-CM | POA: Diagnosis not present

## 2017-09-08 DIAGNOSIS — D631 Anemia in chronic kidney disease: Secondary | ICD-10-CM | POA: Diagnosis not present

## 2017-09-09 ENCOUNTER — Ambulatory Visit: Payer: Medicare Other

## 2017-09-10 DIAGNOSIS — N2581 Secondary hyperparathyroidism of renal origin: Secondary | ICD-10-CM | POA: Diagnosis not present

## 2017-09-10 DIAGNOSIS — D631 Anemia in chronic kidney disease: Secondary | ICD-10-CM | POA: Diagnosis not present

## 2017-09-10 DIAGNOSIS — N186 End stage renal disease: Secondary | ICD-10-CM | POA: Diagnosis not present

## 2017-09-10 DIAGNOSIS — Z992 Dependence on renal dialysis: Secondary | ICD-10-CM | POA: Diagnosis not present

## 2017-09-10 DIAGNOSIS — D509 Iron deficiency anemia, unspecified: Secondary | ICD-10-CM | POA: Diagnosis not present

## 2017-09-13 DIAGNOSIS — D509 Iron deficiency anemia, unspecified: Secondary | ICD-10-CM | POA: Diagnosis not present

## 2017-09-13 DIAGNOSIS — N2581 Secondary hyperparathyroidism of renal origin: Secondary | ICD-10-CM | POA: Diagnosis not present

## 2017-09-13 DIAGNOSIS — Z992 Dependence on renal dialysis: Secondary | ICD-10-CM | POA: Diagnosis not present

## 2017-09-13 DIAGNOSIS — D631 Anemia in chronic kidney disease: Secondary | ICD-10-CM | POA: Diagnosis not present

## 2017-09-13 DIAGNOSIS — N186 End stage renal disease: Secondary | ICD-10-CM | POA: Diagnosis not present

## 2017-09-13 LAB — CBC AND DIFFERENTIAL
HEMATOCRIT: 37 (ref 36–46)
HEMOGLOBIN: 11.8 — AB (ref 12.0–16.0)

## 2017-09-13 LAB — BASIC METABOLIC PANEL
CREATININE: 8.6 — AB (ref <=1.1)
Sodium: 99 — AB (ref 137–147)

## 2017-09-13 LAB — HEPATIC FUNCTION PANEL: ALK PHOS: 54 (ref 25–125)

## 2017-09-14 ENCOUNTER — Ambulatory Visit (INDEPENDENT_AMBULATORY_CARE_PROVIDER_SITE_OTHER): Payer: Medicare Other | Admitting: Gastroenterology

## 2017-09-14 ENCOUNTER — Other Ambulatory Visit: Payer: Self-pay

## 2017-09-14 VITALS — BP 146/84 | HR 70 | Temp 98.5°F | Ht 67.0 in | Wt 203.6 lb

## 2017-09-14 DIAGNOSIS — B182 Chronic viral hepatitis C: Secondary | ICD-10-CM | POA: Diagnosis not present

## 2017-09-14 DIAGNOSIS — R197 Diarrhea, unspecified: Secondary | ICD-10-CM

## 2017-09-14 DIAGNOSIS — K529 Noninfective gastroenteritis and colitis, unspecified: Secondary | ICD-10-CM | POA: Diagnosis not present

## 2017-09-14 DIAGNOSIS — E8809 Other disorders of plasma-protein metabolism, not elsewhere classified: Secondary | ICD-10-CM | POA: Diagnosis not present

## 2017-09-14 DIAGNOSIS — G8929 Other chronic pain: Secondary | ICD-10-CM | POA: Insufficient documentation

## 2017-09-14 DIAGNOSIS — R159 Full incontinence of feces: Secondary | ICD-10-CM

## 2017-09-14 DIAGNOSIS — R1031 Right lower quadrant pain: Secondary | ICD-10-CM

## 2017-09-14 DIAGNOSIS — K7400 Hepatic fibrosis, unspecified: Secondary | ICD-10-CM

## 2017-09-14 NOTE — Progress Notes (Signed)
Cephas Darby, MD [address]  [place]  [place], [place] [address]  Main: [phone]  Fax: [phone]    Gastroenterology Consultation  Referring Provider:     Mikey College, * Primary Care Physician:  Mikey College, NP Primary Gastroenterologist:  Dr. Cephas Darby Reason for Consultation:     Chronic hepatitis C, chronic liver disease, chronic diarrhea        HPI:   [name] is a 51 y.o. y/o female referred by Dr. Merrilyn Puma, Jerrel Ivory, NP  for consultation & management of Chronic diarrhea, chronic hepatitis C and cirrhosis  She has multiple comorbidities as listed below. She was initially seen by Dr. Vicente Males in 11/2016 in our clinic for management of chronic hepatitis C and chronic diarrhea. At the time he recommended upper endoscopy, colonoscopy and workup for her liver disease. She reported that she moved to [facility] and was lost to follow-up. Prior to this she was also seen in [facility] in 04/2016 for chronic diarrhea, hepatitis C and dysphagia.  She was lost to follow-up at that time as well. Currently, she lives in [place], [place].   She has been having chronic diarrhea for more than a year intermittently. Most recent episode started about 10 days ago, after she received IV iron and Epogen at the time of dialysis. She reports having about 4-5 episodes of runny, nonbloody bowel movements. She describes that her stool was black when the diarrhea episode started, currently her stools are light yellow in color. She reports upper abdominal pain and associated with significant postprandial bloating. She denies urgency or incontinence, fever or chills, nausea or vomiting. She denies any weight loss, concern about weight gain especially in her waist. She denies swelling of legs. He denies hematemesis or coffee-ground emesis. She does have chronic normocytic anemia, and receives Epogen with her dialysis.  Hepatitis C Diagnosed in 2003- last used cocaine 6  years back , has had professional tatoos, has been incarcerated, no Armed forces logistics/support/administrative officer. Does not drink alcohol presently, no excess use in the past . No family history of liver disease. Genotype 1a,  high viral load, treatment nave, chronic liver disease manifested as splenomegaly, mild thrombocytopenia, hypoalbuminemia, fibrosis score 0.66, stage F3 in 07/2014. She is immune to hepatitis A and B. And, HIV nonreactive   Ferritin 45 in 02/2013 Her hemoglobin A1c is 8.3 in 06/2017 H Pylori stool antigen negative in 11/2016 Has been on oxycodone for chronic back pain   GI Procedures:  None  Past Medical History:  Diagnosis Date  . Allergy   . Anemia   . Anxiety    worse when not at home - bowel incontinence  . Arthritis   . Ataxia   . CHF (congestive heart failure) (HCC)    history of, has been cleared.  . Chronic kidney disease   . COPD (chronic obstructive pulmonary disease) ([place])   . Depression   . Diabetes mellitus without complication ([place])   . Emphysema of lung ([place])   . End stage renal disease ([place])    MWF dialysis  . Headache    migraines  . Hepatitis 2003   Hep C  . Hypercholesterolemia    pt denies  . Hypertension   . Kidney disease   . Peripheral vascular disease ([place])   . Pneumonia 2015  . Psoriasis   . Tobacco dependence     Past Surgical History:  Procedure Laterality Date  . AV FISTULA PLACEMENT Left 11/2014  .  CESAREAN SECTION    . CHOLECYSTECTOMY    . cyst removed  from left hand Left 1989  . DIALYSIS/PERMA CATHETER INSERTION N/A 05/20/2017   Procedure: Dialysis/Perma Catheter Insertion and fistulagram/LUE angiogram;  Surgeon: Algernon Huxley, MD;  Location: [facility];  Service: Cardiovascular;  Laterality: N/A;  . INCISION AND DRAINAGE ABSCESS N/A 07/16/2016   Procedure: INCISION AND DRAINAGE ABSCESS;  Surgeon: Carloyn Manner, MD;  Location: [facility];  Service: ENT;  Laterality: N/A;  . PERIPHERAL VASCULAR CATHETERIZATION N/A 07/25/2015    Procedure: A/V Shuntogram/Fistulagram;  Surgeon: Algernon Huxley, MD;  Location: [facility];  Service: Cardiovascular;  Laterality: N/A;  . PERIPHERAL VASCULAR CATHETERIZATION Left 07/25/2015   Procedure: A/V Shunt Intervention;  Surgeon: Algernon Huxley, MD;  Location: [facility];  Service: Cardiovascular;  Laterality: Left;  . PERIPHERAL VASCULAR CATHETERIZATION Left 10/07/2015   Procedure: A/V Shuntogram/Fistulagram;  Surgeon: Algernon Huxley, MD;  Location: [facility];  Service: Cardiovascular;  Laterality: Left;  . PERIPHERAL VASCULAR CATHETERIZATION N/A 10/07/2015   Procedure: A/V Shunt Intervention;  Surgeon: Algernon Huxley, MD;  Location: [facility];  Service: Cardiovascular;  Laterality: N/A;  . PERIPHERAL VASCULAR CATHETERIZATION  10/07/2015   Procedure: Dialysis/Perma Catheter Insertion;  Surgeon: Algernon Huxley, MD;  Location: [facility];  Service: Cardiovascular;;  . PERIPHERAL VASCULAR CATHETERIZATION N/A 12/17/2015   Procedure: Dialysis/Perma Catheter Removal;  Surgeon: Katha Cabal, MD;  Location: [facility];  Service: Cardiovascular;  Laterality: N/A;  . PERIPHERAL VASCULAR CATHETERIZATION N/A 01/11/2017   Procedure: Visceral Angiography;  Surgeon: Algernon Huxley, MD;  Location: [facility];  Service: Cardiovascular;  Laterality: N/A;  . PERIPHERAL VASCULAR CATHETERIZATION N/A 01/11/2017   Procedure: Visceral Artery Intervention;  Surgeon: Algernon Huxley, MD;  Location: [facility];  Service: Cardiovascular;  Laterality: N/A;  . rt. tubal and ovary removed    . TONSILLECTOMY    . TUBAL LIGATION      Prior to Admission medications   Medication Sig Start Date End Date Taking? Authorizing Provider  b complex-vitamin c-folic acid (NEPHRO-VITE) 0.8 MG TABS tablet Take 1 tablet by mouth daily.     [provider]  calcium acetate (PHOSLO) 667 MG capsule Take 1,334 mg by mouth 3 (three) times daily with meals.      [provider]  clobetasol ointment (TEMOVATE) 8.78 % Apply 1 application topically daily as needed (for psorasis).     [provider]  diclofenac sodium (VOLTAREN) 1 % GEL Apply 4 g topically 4 (four) times daily. Patient not taking: Reported on 08/19/2017 08/10/17 11/08/17  Gillis Santa, MD  diltiazem (TIAZAC) 120 MG 24 hr capsule Take 120 mg by mouth 2 (two) times daily. 120 mg in am and 240 mg in the pm 11/22/14   [provider]  gabapentin (NEURONTIN) 100 MG capsule Take 1 capsule (100 mg total) by mouth 3 (three) times daily. OR 3 capsules at bedtime. 08/19/17   Mikey College, NP  glucose 4 GM chewable tablet Chew 1 tablet (4 g total) by mouth as needed for low blood sugar. 08/19/17   Mikey College, NP  insulin glargine (LANTUS) 100 UNIT/ML injection Inject 15 Units into the skin daily.     [provider]  insulin regular (NOVOLIN R,HUMULIN R) 100 units/mL injection Inject 0.02-0.04 mLs (2-4 Units total) into the skin 3 (three) times daily with meals. 08/19/17  Mikey College, NP  midodrine (PROAMATINE) 5 MG tablet Take 5 mg by mouth as needed (when blood pressure is low).    [provider]  promethazine (PHENERGAN) 25 MG tablet Take 1 tablet (25 mg total) by mouth every 4 (four) hours as needed for nausea or vomiting. 10/19/16   [name], MD  psyllium (METAMUCIL SMOOTH TEXTURE) 28 % packet Take 1-2 packets twice daily. 08/19/17   Mikey College, NP  sevelamer carbonate (RENVELA) 800 MG tablet Take 1,600 mg by mouth 3 (three) times daily with meals.    [provider]    Family History  Problem Relation Age of Onset  . Hypertension Mother   . Heart disease Mother   . Hypertension Father   . Skin cancer Father      Social History  Substance Use Topics  . Smoking status: Light Tobacco Smoker    Packs/day: 0.25    Years: 20.00    Types: Cigarettes    Last attempt to quit: 05/21/2017  .  Smokeless tobacco: Never Used  . Alcohol use No    Allergies as of 09/14/2017 - Review Complete 09/14/2017  Allergen Reaction Noted  . Cinnamon Anaphylaxis 10/11/2015  . Garlic Anaphylaxis 24/08/7352  . Tylenol [acetaminophen] Anaphylaxis 07/25/2015  . Ciprofloxacin Diarrhea 11/05/2016  . Onion Hives and Swelling 10/11/2015  . Prednisone Other (See Comments) 01/05/2017    Review of Systems:    All systems reviewed and negative except where noted in HPI.   Physical Exam:  BP (!) 146/84   Pulse 70   Temp 98.5 F (36.9 C) (Oral)   Ht 5\' 7"  (1.702 m)   Wt 203 lb 9.6 oz (92.4 kg)   BMI 31.89 kg/m  No LMP recorded. Patient is postmenopausal.  General:   Alert,  Well-developed, well-nourished, pleasant and cooperative in NAD Head:  Normocephalic and atraumatic. Eyes:  Sclera clear, no icterus.   Conjunctiva pink. Ears:  Normal auditory acuity. Nose:  No deformity, discharge, or lesions. Mouth:  No deformity or lesions,oropharynx pink & moist. Neck:  Supple; no masses or thyromegaly. Lungs:  Respirations even and unlabored.  Clear throughout to auscultation.   No wheezes, crackles, or rhonchi. No acute distress. Heart:  Regular rate and rhythm; no murmurs, clicks, rubs, or gallops. Abdomen:  Normal bowel sounds.  Abdominal obesity, significantly redundant skin, abdominal striae present, no appreciable ascites on exam.  Soft, non-tender and non-distended without masses, hepatosplenomegaly or hernias noted.  No guarding or rebound tenderness.   Rectal: Nor performed Msk:  Symmetrical without gross deformities. Good, equal movement & strength bilaterally. Pulses:  Normal pulses noted. Extremities:  No clubbing or edema.  No cyanosis. Neurologic:  Alert and oriented x3;  grossly normal neurologically. Skin:  Psoriatic rash on her right elbow, fingers, No jaundice. Lymph Nodes:  No significant cervical adenopathy. Psych:  Alert and cooperative. Normal mood and affect.  Imaging  Studies:  Reviewed  Assessment and Plan:   TEENA MANGUS is a 51 y.o. White  female with  ESRD on hemodialysis, diabetes on insulin, chronic hepatitis C, genotype 1a, treatment nave, early cirrhosis, well compensated, and chronic intermittent diarrhea with bloating and epigastric pain.  Chronic diarrhea associated with bloating and abdominal pain: IBS-diarrhea or small intestinal bacterial overgrowth or microscopic colitis. Less likely inflammatory or infectious. - Check stool for C. Difficile and GI pathogen panel - Colonoscopy with biopsies  - If stool studies for infection negative, I will try rifaximin 550 mg 3  times daily for 2 weeks - Imodium as needed  Advanced fibrosis/early cirrhosis: Secondary to chronic hepatitis C Fibrosure from 2015 suggests F3, she does have mild splenomegaly, thrombocytopenia, hypoalbuminemia, hyperbilirubinemia CPT score 6, class A - EGD for variceal screening - No evidence of volume overload, recommend 2 g sodium diet - No evidence of encephalopathy - She does have mild normocytic anemia and thrombocytopenia. No coagulopathy. Ferritin normal - HCC screening - check alpha-fetoprotein, ultrasound liver  Chronic hepatitis C, genotype 1a, viral load 255,801 units in 08/11/2016: Treatment nave - She is immune to hepatitis A and B. Her hep B surface antigen nonreactive, hep B surface antibody reactive, hep B core antibody nonreactive, hepatitis A IgG reactive in 08/11/2016. Hepatitis E antigen and antibody negative - Recheck CBC, AST, ALT, AP, Tbili, DBili, PT/INR - Testing for the presence of resistance-associated substitutions (RASs) prior to starting treatment - HCV fibrosure to assess degree of fibrosis - Further recommendations on treatment after above workup. If she meets the criteria for treatment, she will need daily fixed-dose combination of elbasvir (50 mg)/grazoprevir (100 mg) for 12weeks   Follow up in 2-3 weeks after EGD and  colonoscopy   Cephas Darby, MD

## 2017-09-15 DIAGNOSIS — Z992 Dependence on renal dialysis: Secondary | ICD-10-CM | POA: Diagnosis not present

## 2017-09-15 DIAGNOSIS — D631 Anemia in chronic kidney disease: Secondary | ICD-10-CM | POA: Diagnosis not present

## 2017-09-15 DIAGNOSIS — N186 End stage renal disease: Secondary | ICD-10-CM | POA: Diagnosis not present

## 2017-09-15 DIAGNOSIS — D509 Iron deficiency anemia, unspecified: Secondary | ICD-10-CM | POA: Diagnosis not present

## 2017-09-15 DIAGNOSIS — N2581 Secondary hyperparathyroidism of renal origin: Secondary | ICD-10-CM | POA: Diagnosis not present

## 2017-09-16 ENCOUNTER — Encounter: Payer: Self-pay | Admitting: *Deleted

## 2017-09-17 DIAGNOSIS — D509 Iron deficiency anemia, unspecified: Secondary | ICD-10-CM | POA: Diagnosis not present

## 2017-09-17 DIAGNOSIS — N186 End stage renal disease: Secondary | ICD-10-CM | POA: Diagnosis not present

## 2017-09-17 DIAGNOSIS — Z992 Dependence on renal dialysis: Secondary | ICD-10-CM | POA: Diagnosis not present

## 2017-09-17 DIAGNOSIS — D631 Anemia in chronic kidney disease: Secondary | ICD-10-CM | POA: Diagnosis not present

## 2017-09-17 DIAGNOSIS — N2581 Secondary hyperparathyroidism of renal origin: Secondary | ICD-10-CM | POA: Diagnosis not present

## 2017-09-19 DIAGNOSIS — Z992 Dependence on renal dialysis: Secondary | ICD-10-CM | POA: Diagnosis not present

## 2017-09-19 DIAGNOSIS — N186 End stage renal disease: Secondary | ICD-10-CM | POA: Diagnosis not present

## 2017-09-20 DIAGNOSIS — N2581 Secondary hyperparathyroidism of renal origin: Secondary | ICD-10-CM | POA: Diagnosis not present

## 2017-09-20 DIAGNOSIS — Z992 Dependence on renal dialysis: Secondary | ICD-10-CM | POA: Diagnosis not present

## 2017-09-20 DIAGNOSIS — N186 End stage renal disease: Secondary | ICD-10-CM | POA: Diagnosis not present

## 2017-09-20 DIAGNOSIS — Z23 Encounter for immunization: Secondary | ICD-10-CM | POA: Diagnosis not present

## 2017-09-20 DIAGNOSIS — R110 Nausea: Secondary | ICD-10-CM | POA: Diagnosis not present

## 2017-09-20 DIAGNOSIS — D631 Anemia in chronic kidney disease: Secondary | ICD-10-CM | POA: Diagnosis not present

## 2017-09-20 DIAGNOSIS — D509 Iron deficiency anemia, unspecified: Secondary | ICD-10-CM | POA: Diagnosis not present

## 2017-09-20 DIAGNOSIS — R1110 Vomiting, unspecified: Secondary | ICD-10-CM | POA: Diagnosis not present

## 2017-09-20 NOTE — Progress Notes (Deleted)
1630. Pt left msg on VM.  Unable to have dialysis tomorrow. Dialysis place has no room for her. 1730. Spoke to Dr. Junious Dresser. He advised pt MUST have dialysis on day before procedure or be rescheduled. 1735. Spoke to pt. Advised her that we could reschedule her to Tues (10/9) to better fit her dialysis schedule.  She stated that wouldn't work for her. She stated she will get dialysis tomorrow or call us if she can't.

## 2017-09-21 DIAGNOSIS — R110 Nausea: Secondary | ICD-10-CM | POA: Diagnosis not present

## 2017-09-21 DIAGNOSIS — N186 End stage renal disease: Secondary | ICD-10-CM | POA: Diagnosis not present

## 2017-09-21 DIAGNOSIS — D509 Iron deficiency anemia, unspecified: Secondary | ICD-10-CM | POA: Diagnosis not present

## 2017-09-21 DIAGNOSIS — N2581 Secondary hyperparathyroidism of renal origin: Secondary | ICD-10-CM | POA: Diagnosis not present

## 2017-09-21 DIAGNOSIS — D631 Anemia in chronic kidney disease: Secondary | ICD-10-CM | POA: Diagnosis not present

## 2017-09-21 DIAGNOSIS — Z23 Encounter for immunization: Secondary | ICD-10-CM | POA: Diagnosis not present

## 2017-09-21 NOTE — Discharge Instructions (Signed)
General Anesthesia, Adult, Care After °These instructions provide you with information about caring for yourself after your procedure. Your health care provider may also give you more specific instructions. Your treatment has been planned according to current medical practices, but problems sometimes occur. Call your health care provider if you have any problems or questions after your procedure. °What can I expect after the procedure? °After the procedure, it is common to have: °· Vomiting. °· A sore throat. °· Mental slowness. ° °It is common to feel: °· Nauseous. °· Cold or shivery. °· Sleepy. °· Tired. °· Sore or achy, even in parts of your body where you did not have surgery. ° °Follow these instructions at home: °For at least 24 hours after the procedure: °· Do not: °? Participate in activities where you could fall or become injured. °? Drive. °? Use heavy machinery. °? Drink alcohol. °? Take sleeping pills or medicines that cause drowsiness. °? Make important decisions or sign legal documents. °? Take care of children on your own. °· Rest. °Eating and drinking °· If you vomit, drink water, juice, or soup when you can drink without vomiting. °· Drink enough fluid to keep your urine clear or pale yellow. °· Make sure you have little or no nausea before eating solid foods. °· Follow the diet recommended by your health care provider. °General instructions °· Have a responsible adult stay with you until you are awake and alert. °· Return to your normal activities as told by your health care provider. Ask your health care provider what activities are safe for you. °· Take over-the-counter and prescription medicines only as told by your health care provider. °· If you smoke, do not smoke without supervision. °· Keep all follow-up visits as told by your health care provider. This is important. °Contact a health care provider if: °· You continue to have nausea or vomiting at home, and medicines are not helpful. °· You  cannot drink fluids or start eating again. °· You cannot urinate after 8-12 hours. °· You develop a skin rash. °· You have fever. °· You have increasing redness at the site of your procedure. °Get help right away if: °· You have difficulty breathing. °· You have chest pain. °· You have unexpected bleeding. °· You feel that you are having a life-threatening or urgent problem. °This information is not intended to replace advice given to you by your health care provider. Make sure you discuss any questions you have with your health care provider. °Document Released: 03/15/2001 Document Revised: 05/11/2016 Document Reviewed: 11/21/2015 °Elsevier Interactive Patient Education © 2018 [employer] ° °

## 2017-09-22 ENCOUNTER — Ambulatory Visit
Admission: RE | Admit: 2017-09-22 | Discharge: 2017-09-22 | Disposition: A | Discharge status: Home / Self Care | Payer: Medicare Other | Source: Ambulatory Visit | Attending: Gastroenterology | Admitting: Gastroenterology

## 2017-09-22 ENCOUNTER — Ambulatory Visit: Payer: Self-pay

## 2017-09-22 ENCOUNTER — Other Ambulatory Visit
Admission: RE | Admit: 2017-09-22 | Discharge: 2017-09-22 | Disposition: A | Discharge status: Home / Self Care | Payer: Medicare Other | Source: Ambulatory Visit | Attending: Anesthesiology | Admitting: Anesthesiology

## 2017-09-22 ENCOUNTER — Ambulatory Visit: Payer: Medicare Other | Admitting: Anesthesiology

## 2017-09-22 ENCOUNTER — Encounter
Admission: RE | Disposition: A | Discharge status: Home / Self Care | Payer: Self-pay | Source: Ambulatory Visit | Attending: Gastroenterology

## 2017-09-22 ENCOUNTER — Other Ambulatory Visit: Payer: Self-pay

## 2017-09-22 DIAGNOSIS — K449 Diaphragmatic hernia without obstruction or gangrene: Secondary | ICD-10-CM | POA: Insufficient documentation

## 2017-09-22 DIAGNOSIS — D124 Benign neoplasm of descending colon: Secondary | ICD-10-CM

## 2017-09-22 DIAGNOSIS — R197 Diarrhea, unspecified: Secondary | ICD-10-CM | POA: Diagnosis not present

## 2017-09-22 DIAGNOSIS — K644 Residual hemorrhoidal skin tags: Secondary | ICD-10-CM | POA: Insufficient documentation

## 2017-09-22 DIAGNOSIS — D122 Benign neoplasm of ascending colon: Secondary | ICD-10-CM

## 2017-09-22 DIAGNOSIS — J439 Emphysema, unspecified: Secondary | ICD-10-CM | POA: Insufficient documentation

## 2017-09-22 DIAGNOSIS — G8929 Other chronic pain: Secondary | ICD-10-CM | POA: Diagnosis not present

## 2017-09-22 DIAGNOSIS — Z79899 Other long term (current) drug therapy: Secondary | ICD-10-CM | POA: Insufficient documentation

## 2017-09-22 DIAGNOSIS — Z794 Long term (current) use of insulin: Secondary | ICD-10-CM | POA: Insufficient documentation

## 2017-09-22 DIAGNOSIS — E1151 Type 2 diabetes mellitus with diabetic peripheral angiopathy without gangrene: Secondary | ICD-10-CM | POA: Diagnosis not present

## 2017-09-22 DIAGNOSIS — E1140 Type 2 diabetes mellitus with diabetic neuropathy, unspecified: Secondary | ICD-10-CM | POA: Insufficient documentation

## 2017-09-22 DIAGNOSIS — D125 Benign neoplasm of sigmoid colon: Secondary | ICD-10-CM | POA: Diagnosis not present

## 2017-09-22 DIAGNOSIS — I120 Hypertensive chronic kidney disease with stage 5 chronic kidney disease or end stage renal disease: Secondary | ICD-10-CM | POA: Insufficient documentation

## 2017-09-22 DIAGNOSIS — K2950 Unspecified chronic gastritis without bleeding: Secondary | ICD-10-CM | POA: Diagnosis not present

## 2017-09-22 DIAGNOSIS — K648 Other hemorrhoids: Secondary | ICD-10-CM | POA: Insufficient documentation

## 2017-09-22 DIAGNOSIS — K2930 Chronic superficial gastritis without bleeding: Secondary | ICD-10-CM | POA: Diagnosis not present

## 2017-09-22 DIAGNOSIS — K529 Noninfective gastroenteritis and colitis, unspecified: Secondary | ICD-10-CM | POA: Insufficient documentation

## 2017-09-22 DIAGNOSIS — Z1389 Encounter for screening for other disorder: Secondary | ICD-10-CM | POA: Diagnosis not present

## 2017-09-22 DIAGNOSIS — K219 Gastro-esophageal reflux disease without esophagitis: Secondary | ICD-10-CM | POA: Diagnosis not present

## 2017-09-22 DIAGNOSIS — N186 End stage renal disease: Secondary | ICD-10-CM | POA: Insufficient documentation

## 2017-09-22 DIAGNOSIS — K259 Gastric ulcer, unspecified as acute or chronic, without hemorrhage or perforation: Secondary | ICD-10-CM | POA: Diagnosis not present

## 2017-09-22 DIAGNOSIS — R1031 Right lower quadrant pain: Secondary | ICD-10-CM

## 2017-09-22 DIAGNOSIS — K5289 Other specified noninfective gastroenteritis and colitis: Secondary | ICD-10-CM | POA: Diagnosis not present

## 2017-09-22 DIAGNOSIS — F17210 Nicotine dependence, cigarettes, uncomplicated: Secondary | ICD-10-CM | POA: Insufficient documentation

## 2017-09-22 DIAGNOSIS — R159 Full incontinence of feces: Secondary | ICD-10-CM

## 2017-09-22 DIAGNOSIS — Z992 Dependence on renal dialysis: Secondary | ICD-10-CM | POA: Diagnosis not present

## 2017-09-22 DIAGNOSIS — E1122 Type 2 diabetes mellitus with diabetic chronic kidney disease: Secondary | ICD-10-CM | POA: Diagnosis not present

## 2017-09-22 HISTORY — PX: COLONOSCOPY: SHX5424

## 2017-09-22 HISTORY — DX: Type 2 diabetes mellitus with diabetic neuropathy, unspecified: E11.40

## 2017-09-22 HISTORY — DX: Presence of dental prosthetic device (complete) (partial): Z97.2

## 2017-09-22 HISTORY — PX: POLYPECTOMY: SHX5525

## 2017-09-22 HISTORY — DX: Other specified disorders of the skin and subcutaneous tissue: L98.8

## 2017-09-22 HISTORY — PX: ESOPHAGOGASTRODUODENOSCOPY: SHX5428

## 2017-09-22 LAB — GLUCOSE, CAPILLARY
GLUCOSE-CAPILLARY: 189 mg/dL — AB (ref 65–99)
Glucose-Capillary: 106 mg/dL — ABNORMAL HIGH (ref 65–99)

## 2017-09-22 LAB — POTASSIUM: POTASSIUM: 3.8 mmol/L (ref 3.5–5.1)

## 2017-09-22 SURGERY — COLONOSCOPY
Anesthesia: General | Wound class: Clean Contaminated

## 2017-09-22 MED ORDER — LIDOCAINE HCL (CARDIAC) 20 MG/ML IV SOLN
INTRAVENOUS | Status: DC | PRN
  Administered 2017-09-22: 20 mg via INTRAVENOUS

## 2017-09-22 MED ORDER — LACTATED RINGERS IV SOLN: INTRAVENOUS | Status: DC

## 2017-09-22 MED ORDER — GLUCAGON HCL RDNA (DIAGNOSTIC) 1 MG IJ SOLR
INTRAMUSCULAR | Status: DC | PRN
  Administered 2017-09-22 (×2): 1 mg via INTRAVENOUS

## 2017-09-22 MED ORDER — GLYCOPYRROLATE 0.2 MG/ML IJ SOLN
INTRAMUSCULAR | Status: DC | PRN
  Administered 2017-09-22: 0.1 mg via INTRAVENOUS

## 2017-09-22 MED ORDER — PROPOFOL 10 MG/ML IV BOLUS
INTRAVENOUS | Status: DC | PRN
  Administered 2017-09-22 (×5): 50 mg via INTRAVENOUS
  Administered 2017-09-22: 20 mg via INTRAVENOUS
  Administered 2017-09-22 (×3): 50 mg via INTRAVENOUS
  Administered 2017-09-22: 30 mg via INTRAVENOUS
  Administered 2017-09-22: 50 mg via INTRAVENOUS
  Administered 2017-09-22: 80 mg via INTRAVENOUS
  Administered 2017-09-22: 50 mg via INTRAVENOUS
  Administered 2017-09-22 (×2): 20 mg via INTRAVENOUS
  Administered 2017-09-22: 30 mg via INTRAVENOUS
  Administered 2017-09-22: 50 mg via INTRAVENOUS
  Administered 2017-09-22: 30 mg via INTRAVENOUS

## 2017-09-22 MED ORDER — LACTATED RINGERS IV SOLN
INTRAVENOUS | Status: DC
  Administered 2017-09-22: 09:00:00 via INTRAVENOUS

## 2017-09-22 SURGICAL SUPPLY — 23 items
CANISTER SUCT 1200ML W/VALVE (MISCELLANEOUS) ×4 IMPLANT
CLIP HMST 235XBRD CATH ROT (MISCELLANEOUS) IMPLANT
CLIP RESOLUTION 360 11X235 (MISCELLANEOUS)
FCP ESCP3.2XJMB 240X2.8X (MISCELLANEOUS)
FORCEPS BIOP RAD 4 LRG CAP 4 (CUTTING FORCEPS) ×4 IMPLANT
FORCEPS BIOP RJ4 240 W/NDL (MISCELLANEOUS)
FORCEPS ESCP3.2XJMB 240X2.8X (MISCELLANEOUS) IMPLANT
GOWN CVR UNV OPN BCK APRN NK (MISCELLANEOUS) ×4 IMPLANT
GOWN ISOL THUMB LOOP REG UNIV (MISCELLANEOUS) ×4
INJECTOR VARIJECT VIN23 (MISCELLANEOUS) IMPLANT
KIT DEFENDO VALVE AND CONN (KITS) IMPLANT
KIT ENDO PROCEDURE OLY (KITS) ×4 IMPLANT
MARKER SPOT ENDO TATTOO 5ML (MISCELLANEOUS) IMPLANT
PAD GROUND ADULT SPLIT (MISCELLANEOUS) ×4 IMPLANT
PROBE APC STR FIRE (PROBE) IMPLANT
RETRIEVER NET ROTH 2.5X230 LF (MISCELLANEOUS) IMPLANT
SNARE SHORT THROW 13M SML OVAL (MISCELLANEOUS) ×4 IMPLANT
SNARE SHORT THROW 30M LRG OVAL (MISCELLANEOUS) IMPLANT
SNARE SNG USE RND 15MM (INSTRUMENTS) ×4 IMPLANT
SPOT EX ENDOSCOPIC TATTOO (MISCELLANEOUS)
TRAP ETRAP POLY (MISCELLANEOUS) ×4 IMPLANT
VARIJECT INJECTOR VIN23 (MISCELLANEOUS)
WATER STERILE IRR 250ML POUR (IV SOLUTION) ×4 IMPLANT

## 2017-09-22 NOTE — Anesthesia Procedure Notes (Signed)
Procedure Name: MAC Date/Time: 09/22/2017 10:46 AM Performed by: Janna Arch Pre-anesthesia Checklist: Patient identified, Emergency Drugs available, Suction available and Patient being monitored Patient Re-evaluated:Patient Re-evaluated prior to induction Oxygen Delivery Method: Nasal cannula

## 2017-09-22 NOTE — Anesthesia Preprocedure Evaluation (Signed)
Anesthesia Evaluation  Patient identified by MRN, date of birth, ID band Patient awake    Reviewed: Allergy & Precautions, H&P , NPO status , Patient's Chart, lab work & pertinent test results, reviewed documented beta blocker date and time   Airway Mallampati: II  TM Distance: >3 FB Neck ROM: full    Dental no notable dental hx.    Pulmonary COPD, Current Smoker,    Pulmonary exam normal breath sounds clear to auscultation       Cardiovascular Exercise Tolerance: Good hypertension, negative cardio ROS   Rhythm:regular Rate:Normal     Neuro/Psych  Headaches, PSYCHIATRIC DISORDERS (anxiety)    GI/Hepatic negative GI ROS, Neg liver ROS,   Endo/Other  diabetes, Type 2  Renal/GU ESRF and DialysisRenal diseaseLast HD 10/2, K 3.8  negative genitourinary   Musculoskeletal   Abdominal   Peds  Hematology negative hematology ROS (+)   Anesthesia Other Findings   Reproductive/Obstetrics negative OB ROS                             Anesthesia Physical Anesthesia Plan  ASA: IV  Anesthesia Plan: General   Post-op Pain Management:    Induction:   PONV Risk Score and Plan:   Airway Management Planned:   Additional Equipment:   Intra-op Plan:   Post-operative Plan:   Informed Consent: I have reviewed the patients History and Physical, chart, labs and discussed the procedure including the risks, benefits and alternatives for the proposed anesthesia with the patient or authorized representative who has indicated his/her understanding and acceptance.   Dental Advisory Given  Plan Discussed with: CRNA  Anesthesia Plan Comments:         Anesthesia Quick Evaluation

## 2017-09-22 NOTE — Telephone Encounter (Signed)
Bowel and  Dietary Habits  1. Constipation: YES 2. Diarrhea: YES 3.Straining for BM: NO 4.Bowel Movement Time: 35mins or more 5.Prolapse: YES 6. "Still Not Done" feeling:  YES 7. Fiber? NO used to take Metamucil 8.6-8 glasses of water: YES  SYMPTOMS  1. Bleeding 2. Pressure or Swelling 3. Itching 4. Pain 5. Burning  Additional Questions  1. Latex Allergy: NO 2. Pregnant: NO 3. ED Meds: NO 4. Blood Thinners: NO 5. Diagnosis of Crohns, Proctitis, Portal Hypertension, Anal or Rectal Cancer: NO 6. Immunosuppressants or Radiation: NO 7. Antibiotics required: NO

## 2017-09-22 NOTE — Op Note (Signed)
[facility] Gastroenterology Patient Name: [name] Procedure Date: 09/22/2017 9:42 AM MRN: [id number] Account #: [id number] Date of Birth: 02-Aug-1966 Admit Type: Outpatient Age: 51 Room: [facility] OR ROOM 01 Gender: Female Note Status: Finalized Procedure:            Upper GI endoscopy Indications:          Cirrhosis rule out esophageal varices Providers:            Lin Landsman MD, MD Referring MD:         Olin Hauser (Referring MD) Medicines:            Monitored Anesthesia Care Complications:        No immediate complications. Estimated blood loss:                        Minimal. Procedure:            Pre-Anesthesia Assessment:                       - Prior to the procedure, a History and Physical was                        performed, and patient medications and allergies were                        reviewed. The patient is competent. The risks and                        benefits of the procedure and the sedation options and                        risks were discussed with the patient. All questions                        were answered and informed consent was obtained.                        Patient identification and proposed procedure were                        verified by the physician, the nurse, the                        anesthesiologist, the anesthetist and the technician in                        the pre-procedure area in the procedure room. Mental                        Status Examination: alert and oriented. Airway                        Examination: normal oropharyngeal airway and neck                        mobility. Respiratory Examination: clear to                        auscultation. CV Examination: normal. Prophylactic  Antibiotics: The patient does not require prophylactic                        antibiotics. Prior Anticoagulants: The patient has                        taken no previous anticoagulant or  antiplatelet agents.                        ASA Grade Assessment: IV - A patient with severe                        systemic disease that is a constant threat to life.                        After reviewing the risks and benefits, the patient was                        deemed in satisfactory condition to undergo the                        procedure. The anesthesia plan was to use monitored                        anesthesia care (MAC). Immediately prior to                        administration of medications, the patient was                        re-assessed for adequacy to receive sedatives. The                        heart rate, respiratory rate, oxygen saturations, blood                        pressure, adequacy of pulmonary ventilation, and                        response to care were monitored throughout the                        procedure. The physical status of the patient was                        re-assessed after the procedure.                       After obtaining informed consent, the endoscope was                        passed under direct vision. Throughout the procedure,                        the patient's blood pressure, pulse, and oxygen                        saturations were monitored continuously. The Olympus  GIF H180J Endoscope (V#:[id number]) was introduced through                        the mouth, and advanced to the second part of duodenum.                        The upper GI endoscopy was accomplished without                        difficulty. The patient tolerated the procedure well. Findings:      The duodenal bulb and second portion of the duodenum were normal.       Biopsies for histology were taken with a cold forceps for evaluation of       celiac disease.      A few dispersed, diminutive non-bleeding erosions were found in the       gastric body and in the gastric antrum. There were stigmata of recent       bleeding. Biopsies were  taken with a cold forceps for Helicobacter       pylori testing. No gastric varices      A 1 cm hiatal hernia was present.      The Z-line was irregular. This was biopsied with a cold forceps for       histology.      The examined esophagus was normal. No varices Impression:           - Normal duodenal bulb and second portion of the                        duodenum. Biopsied.                       - Non-bleeding erosive gastropathy. Biopsied.                       - 1 cm hiatal hernia.                       - Z-line irregular. Biopsied.                       - Normal esophagus. Recommendation:       - Await pathology results.                       - Discharge patient to home.                       - Resume previous diet.                       - Continue present medications.                       - Repeat upper endoscopy in 2 years for surveillance.                       - Return to my office as previously scheduled. Procedure Code(s):    --- Professional ---                       [phone], Esophagogastroduodenoscopy, flexible, transoral;  with biopsy, single or multiple Diagnosis Code(s):    --- Professional ---                       K31.89, Other diseases of stomach and duodenum                       K44.9, Diaphragmatic hernia without obstruction or                        gangrene                       K22.8, Other specified diseases of esophagus                       K74.60, Unspecified cirrhosis of liver CPT copyright 2016 American Medical Association. All rights reserved. The codes documented in this report are preliminary and upon coder review may  be revised to meet current compliance requirements. Dr. Ulyess Mort Lin Landsman MD, MD 09/22/2017 10:07:42 AM This report has been signed electronically. Number of Addenda: 0 Note Initiated On: 09/22/2017 9:42 AM      [facility]

## 2017-09-22 NOTE — Anesthesia Postprocedure Evaluation (Signed)
Anesthesia Post Note  Patient: [name]  Procedure(s) Performed: COLONOSCOPY (N/A ) ESOPHAGOGASTRODUODENOSCOPY (EGD) (N/A ) POLYPECTOMY  Patient location during evaluation: PACU Anesthesia Type: General Level of consciousness: awake and alert Pain management: pain level controlled Vital Signs Assessment: post-procedure vital signs reviewed and stable Respiratory status: spontaneous breathing, nonlabored ventilation, respiratory function stable and patient connected to nasal cannula oxygen Cardiovascular status: blood pressure returned to baseline and stable Postop Assessment: no apparent nausea or vomiting Anesthetic complications: no    Alisa Graff

## 2017-09-22 NOTE — H&P (Signed)
Cephas Darby, MD [address]  [place]  [place], [place] [address]  Main: [phone]  Fax: [phone] Pager: [phone]  Primary Care Physician:  Mikey College, NP Primary Gastroenterologist:  Dr. Cephas Darby  Pre-Procedure History & Physical: HPI:  [name] is a 51 y.o. female is here for an endoscopy and colonoscopy.   Past Medical History:  Diagnosis Date  . Allergy   . Anemia   . Anxiety    worse when not at home - bowel incontinence  . Arthritis   . Ataxia   . CHF (congestive heart failure) (HCC)    history of, has been cleared.  . Chronic kidney disease   . COPD (chronic obstructive pulmonary disease) ([place])   . Depression   . Diabetes mellitus without complication ([place])   . Emphysema of lung ([place])   . End stage renal disease ([place])    MWF dialysis  . Fistula    left upper arm  . Headache    migraines  . Hepatitis 2003   Hep C  . Hypercholesterolemia    pt denies  . Hypertension   . Kidney disease   . Neuropathy, diabetic (HCC)    lower legs  . Peripheral vascular disease ([place])   . Pneumonia 2015  . Psoriasis   . Tobacco dependence   . Wears dentures    full upper, partial lower    Past Surgical History:  Procedure Laterality Date  . AV FISTULA PLACEMENT Left 11/2014  . CESAREAN SECTION    . CHOLECYSTECTOMY    . cyst removed  from left hand Left 1989  . DIALYSIS/PERMA CATHETER INSERTION N/A 05/20/2017   Procedure: Dialysis/Perma Catheter Insertion and fistulagram/LUE angiogram;  Surgeon: Algernon Huxley, MD;  Location: [place] [facility];  Service: Cardiovascular;  Laterality: N/A;  . INCISION AND DRAINAGE ABSCESS N/A 07/16/2016   Procedure: INCISION AND DRAINAGE ABSCESS;  Surgeon: Carloyn Manner, MD;  Location: [facility] ORS;  Service: ENT;  Laterality: N/A;  . PERIPHERAL VASCULAR CATHETERIZATION N/A 07/25/2015   Procedure: A/V Shuntogram/Fistulagram;  Surgeon: Algernon Huxley, MD;  Location: [place] [facility];  Service:  Cardiovascular;  Laterality: N/A;  . PERIPHERAL VASCULAR CATHETERIZATION Left 07/25/2015   Procedure: A/V Shunt Intervention;  Surgeon: Algernon Huxley, MD;  Location: [place] [facility];  Service: Cardiovascular;  Laterality: Left;  . PERIPHERAL VASCULAR CATHETERIZATION Left 10/07/2015   Procedure: A/V Shuntogram/Fistulagram;  Surgeon: Algernon Huxley, MD;  Location: [place] [facility];  Service: Cardiovascular;  Laterality: Left;  . PERIPHERAL VASCULAR CATHETERIZATION N/A 10/07/2015   Procedure: A/V Shunt Intervention;  Surgeon: Algernon Huxley, MD;  Location: [place] [facility];  Service: Cardiovascular;  Laterality: N/A;  . PERIPHERAL VASCULAR CATHETERIZATION  10/07/2015   Procedure: Dialysis/Perma Catheter Insertion;  Surgeon: Algernon Huxley, MD;  Location: [place] [facility];  Service: Cardiovascular;;  . PERIPHERAL VASCULAR CATHETERIZATION N/A 12/17/2015   Procedure: Dialysis/Perma Catheter Removal;  Surgeon: Katha Cabal, MD;  Location: [place] [facility];  Service: Cardiovascular;  Laterality: N/A;  . PERIPHERAL VASCULAR CATHETERIZATION N/A 01/11/2017   Procedure: Visceral Angiography;  Surgeon: Algernon Huxley, MD;  Location: [place] [facility];  Service: Cardiovascular;  Laterality: N/A;  . PERIPHERAL VASCULAR CATHETERIZATION N/A 01/11/2017   Procedure: Visceral Artery Intervention;  Surgeon: Algernon Huxley, MD;  Location: [place] [facility];  Service: Cardiovascular;  Laterality: N/A;  . rt. tubal and ovary removed    . TONSILLECTOMY    .  TUBAL LIGATION      Prior to Admission medications   Medication Sig Start Date End Date Taking? Authorizing Provider  b complex-vitamin c-folic acid (NEPHRO-VITE) 0.8 MG TABS tablet Take 1 tablet by mouth daily.    Yes [provider]  calcium acetate (PHOSLO) 667 MG capsule Take 1,334 mg by mouth 3 (three) times daily with meals.    Yes [provider]  clobetasol ointment (TEMOVATE) 7.89 % Apply 1 application topically daily as  needed (for psorasis).    Yes [provider]  diclofenac sodium (VOLTAREN) 1 % GEL Apply 4 g topically 4 (four) times daily. 08/10/17 11/08/17 Yes Gillis Santa, MD  diltiazem (TIAZAC) 120 MG 24 hr capsule Take 120 mg by mouth 2 (two) times daily. 120 mg in am and 240 mg in the pm 11/22/14  Yes [provider]  glucose 4 GM chewable tablet Chew 1 tablet (4 g total) by mouth as needed for low blood sugar. 08/19/17  Yes Mikey College, NP  insulin glargine (LANTUS) 100 UNIT/ML injection Inject 15 Units into the skin daily.    Yes [provider]  insulin regular (NOVOLIN R,HUMULIN R) 100 units/mL injection Inject 0.02-0.04 mLs (2-4 Units total) into the skin 3 (three) times daily with meals. 08/19/17  Yes Mikey College, NP  midodrine (PROAMATINE) 5 MG tablet Take 5 mg by mouth as needed (when blood pressure is low).   Yes [provider]  promethazine (PHENERGAN) 25 MG tablet Take 1 tablet (25 mg total) by mouth every 4 (four) hours as needed for nausea or vomiting. 10/19/16  Yes [name], MD  sevelamer carbonate (RENVELA) 800 MG tablet Take 1,600 mg by mouth 3 (three) times daily with meals.   Yes [provider]  gabapentin (NEURONTIN) 100 MG capsule Take 1 capsule (100 mg total) by mouth 3 (three) times daily. OR 3 capsules at bedtime. Patient not taking: Reported on 09/14/2017 08/19/17   Mikey College, NP    Allergies as of 09/14/2017 - Review Complete 09/14/2017  Allergen Reaction Noted  . Cinnamon Anaphylaxis 10/11/2015  . Garlic Anaphylaxis 38/09/1750  . Tylenol [acetaminophen] Anaphylaxis 07/25/2015  . Ciprofloxacin Diarrhea 11/05/2016  . Onion Hives and Swelling 10/11/2015  . Prednisone Other (See Comments) 01/05/2017    Family History  Problem Relation Age of Onset  . Hypertension Mother   . Heart disease Mother   . Hypertension Father   . Skin cancer Father     Social History   Social History  .  Marital status: Legally Separated    Spouse name: N/A  . Number of children: N/A  . Years of education: N/A   Occupational History  . disabled    Social History Main Topics  . Smoking status: Light Tobacco Smoker    Packs/day: 0.25    Years: 20.00    Types: Cigarettes    Last attempt to quit: 05/21/2017  . Smokeless tobacco: Never Used  . Alcohol use No  . Drug use: No  . Sexual activity: Not on file   Other Topics Concern  . Not on file   Social History Narrative  . No narrative on file    Review of Systems: See HPI, otherwise negative ROS  Physical Exam: BP (!) 195/81   Pulse 82   Temp 98.8 F (37.1 C) (Temporal)   Resp 18   Ht 5\' 7"  (1.702 m)   Wt 203 lb (92.1 kg)   SpO2 100%   BMI  31.79 kg/m  General:   Alert,  pleasant and cooperative in NAD Head:  Normocephalic and atraumatic. Neck:  Supple; no masses or thyromegaly. Lungs:  Clear throughout to auscultation.    Heart:  Regular rate and rhythm. Abdomen:  Soft, nontender and nondistended. Normal bowel sounds, without guarding, and without rebound.   Neurologic:  Alert and  oriented x4;  grossly normal neurologically.  Impression/Plan: [name] is here for an endoscopy and colonoscopy to be performed for variceal screening and chronic diarrhea  Risks, benefits, limitations, and alternatives regarding  endoscopy and colonoscopy have been reviewed with the patient.  Questions have been answered.  All parties agreeable.   Sherri Sear, MD  09/22/2017, 9:13 AM

## 2017-09-22 NOTE — Transfer of Care (Signed)
Immediate Anesthesia Transfer of Care Note  Patient: [name]  Procedure(s) Performed: COLONOSCOPY (N/A ) ESOPHAGOGASTRODUODENOSCOPY (EGD) (N/A ) POLYPECTOMY  Patient Location: PACU  Anesthesia Type: General  Level of Consciousness: awake, alert  and patient cooperative  Airway and Oxygen Therapy: Patient Spontanous Breathing and Patient connected to supplemental oxygen  Post-op Assessment: Post-op Vital signs reviewed, Patient's Cardiovascular Status Stable, Respiratory Function Stable, Patent Airway and No signs of Nausea or vomiting  Post-op Vital Signs: Reviewed and stable  Complications: No apparent anesthesia complications

## 2017-09-22 NOTE — Op Note (Signed)
[facility] Gastroenterology Patient Name: [name] Procedure Date: 09/22/2017 9:44 AM MRN: [id number] Account #: [id number] Date of Birth: [birth year] Admit Type: Outpatient Age: 51 Room: [facility] OR ROOM 01 Gender: Female Note Status: Finalized Procedure:            Colonoscopy Indications:          Chronic diarrhea Providers:            Lin Landsman MD, MD Referring MD:         Olin Hauser (Referring MD) Medicines:            Monitored Anesthesia Care Complications:        No immediate complications. Estimated blood loss: None. Procedure:            Pre-Anesthesia Assessment:                       - Prior to the procedure, a History and Physical was                        performed, and patient medications and allergies were                        reviewed. The patient is competent. The risks and                        benefits of the procedure and the sedation options and                        risks were discussed with the patient. All questions                        were answered and informed consent was obtained.                        Patient identification and proposed procedure were                        verified by the physician, the nurse, the                        anesthesiologist, the anesthetist and the technician in                        the pre-procedure area in the procedure room. Mental                        Status Examination: alert and oriented. Airway                        Examination: normal oropharyngeal airway and neck                        mobility. Respiratory Examination: clear to                        auscultation. CV Examination: normal. Prophylactic                        Antibiotics: The patient does not require prophylactic  antibiotics. Prior Anticoagulants: The patient has                        taken no previous anticoagulant or antiplatelet agents.                        ASA Grade  Assessment: IV - A patient with severe                        systemic disease that is a constant threat to life.                        After reviewing the risks and benefits, the patient was                        deemed in satisfactory condition to undergo the                        procedure. The anesthesia plan was to use monitored                        anesthesia care (MAC). Immediately prior to                        administration of medications, the patient was                        re-assessed for adequacy to receive sedatives. The                        heart rate, respiratory rate, oxygen saturations, blood                        pressure, adequacy of pulmonary ventilation, and                        response to care were monitored throughout the                        procedure. The physical status of the patient was                        re-assessed after the procedure.                       After obtaining informed consent, the colonoscope was                        passed under direct vision. Throughout the procedure,                        the patient's blood pressure, pulse, and oxygen                        saturations were monitored continuously. The [place]o [id number]) was introduced through the  anus and advanced to the the cecum, identified by                        appendiceal orifice and ileocecal valve. The                        colonoscopy was performed without difficulty. The                        patient tolerated the procedure well. The quality of                        the bowel preparation was evaluated using the BBPS                        [facility] Bowel Preparation Scale) with scores of: Right                        Colon = 3, Transverse Colon = 3 and Left Colon = 3                        (entire mucosa seen well with no residual staining,                        small fragments of stool or  opaque liquid). The total                        BBPS score equals 9. Findings:      A 7 mm polyp was found in the ascending colon. The polyp was flat. The       polyp was removed with a hot snare. Resection and retrieval were       complete.      A 7 mm polyp was found in the descending colon. The polyp was flat. The       polyp was removed with a hot snare. Resection and retrieval were       complete.      A 15 mm polyp was found in the sigmoid colon. The polyp was       pedunculated. The polyp was removed with a hot snare. Resection and       retrieval were complete.      Normal mucosa was found in the entire colon. Biopsies for histology were       taken with a cold forceps from the entire colon for evaluation of       microscopic colitis.      Non-bleeding external internal hemorrhoids were found during       retroflexion. The hemorrhoids were large.      The perianal exam findings include non-thrombosed external hemorrhoids. Impression:           - One 7 mm polyp in the ascending colon, removed with a                        hot snare. Resected and retrieved.                       - One 7 mm polyp in the descending colon, removed with  a hot snare. Resected and retrieved.                       - One 15 mm polyp in the sigmoid colon, removed with a                        hot snare. Resected and retrieved.                       - Normal mucosa in the entire examined colon. Biopsied.                       - Non-bleeding external internal hemorrhoids.                       - Non-thrombosed external hemorrhoids found on perianal                        exam. Recommendation:       - Discharge patient to home.                       - Resume previous diet today.                       - Continue present medications.                       - Await pathology results.                       - Repeat colonoscopy in 3 years for surveillance based                        on  pathology results.                       - Return to my office as previously scheduled.                       - Refer to hemorrhoid clinic for banding Procedure Code(s):    --- Professional ---                       [phone], Colonoscopy, flexible; with removal of tumor(s),                        polyp(s), or other lesion(s) by snare technique                       [id number], 59, Colonoscopy, flexible; with biopsy, single                        or multiple Diagnosis Code(s):    --- Professional ---                       D12.2, Benign neoplasm of ascending colon                       D12.5, Benign neoplasm of sigmoid colon                       D12.4, Benign neoplasm of descending colon  K64.4, Residual hemorrhoidal skin tags                       K64.8, Other hemorrhoids                       K52.9, Noninfective gastroenteritis and colitis,                        unspecified CPT copyright 2016 American Medical Association. All rights reserved. The codes documented in this report are preliminary and upon coder review may  be revised to meet current compliance requirements. Dr. Ulyess Mort Lin Landsman MD, MD 09/22/2017 11:03:55 AM This report has been signed electronically. Number of Addenda: 0 Note Initiated On: 09/22/2017 9:44 AM Scope Withdrawal Time: 0 hours 39 minutes 37 seconds  Total Procedure Duration: 0 hours 43 minutes 32 seconds       [facility]

## 2017-09-23 ENCOUNTER — Encounter: Payer: Self-pay | Admitting: Gastroenterology

## 2017-09-24 ENCOUNTER — Encounter: Payer: Self-pay | Admitting: Gastroenterology

## 2017-09-24 DIAGNOSIS — D509 Iron deficiency anemia, unspecified: Secondary | ICD-10-CM | POA: Diagnosis not present

## 2017-09-24 DIAGNOSIS — N2581 Secondary hyperparathyroidism of renal origin: Secondary | ICD-10-CM | POA: Diagnosis not present

## 2017-09-24 DIAGNOSIS — Z23 Encounter for immunization: Secondary | ICD-10-CM | POA: Diagnosis not present

## 2017-09-24 DIAGNOSIS — R110 Nausea: Secondary | ICD-10-CM | POA: Diagnosis not present

## 2017-09-24 DIAGNOSIS — D631 Anemia in chronic kidney disease: Secondary | ICD-10-CM | POA: Diagnosis not present

## 2017-09-24 DIAGNOSIS — N186 End stage renal disease: Secondary | ICD-10-CM | POA: Diagnosis not present

## 2017-09-27 DIAGNOSIS — N186 End stage renal disease: Secondary | ICD-10-CM | POA: Diagnosis not present

## 2017-09-27 DIAGNOSIS — Z23 Encounter for immunization: Secondary | ICD-10-CM | POA: Diagnosis not present

## 2017-09-27 DIAGNOSIS — D631 Anemia in chronic kidney disease: Secondary | ICD-10-CM | POA: Diagnosis not present

## 2017-09-27 DIAGNOSIS — N2581 Secondary hyperparathyroidism of renal origin: Secondary | ICD-10-CM | POA: Diagnosis not present

## 2017-09-27 DIAGNOSIS — D509 Iron deficiency anemia, unspecified: Secondary | ICD-10-CM | POA: Diagnosis not present

## 2017-09-27 DIAGNOSIS — R110 Nausea: Secondary | ICD-10-CM | POA: Diagnosis not present

## 2017-09-28 ENCOUNTER — Encounter: Payer: Self-pay | Admitting: Gastroenterology

## 2017-09-28 ENCOUNTER — Ambulatory Visit
Admission: RE | Admit: 2017-09-28 | Discharge: 2017-09-28 | Disposition: A | Discharge status: Home / Self Care | Payer: Medicare Other | Source: Ambulatory Visit | Attending: Gastroenterology | Admitting: Gastroenterology

## 2017-09-28 ENCOUNTER — Ambulatory Visit
Admission: RE | Admit: 2017-09-28 | Discharge: 2017-09-28 | Disposition: A | Discharge status: Home / Self Care | Payer: Medicare Other | Source: Ambulatory Visit | Attending: Student in an Organized Health Care Education/Training Program | Admitting: Student in an Organized Health Care Education/Training Program

## 2017-09-28 DIAGNOSIS — M5125 Other intervertebral disc displacement, thoracolumbar region: Secondary | ICD-10-CM | POA: Insufficient documentation

## 2017-09-28 DIAGNOSIS — K769 Liver disease, unspecified: Secondary | ICD-10-CM | POA: Diagnosis not present

## 2017-09-28 DIAGNOSIS — K7400 Hepatic fibrosis, unspecified: Secondary | ICD-10-CM

## 2017-09-28 DIAGNOSIS — Z9049 Acquired absence of other specified parts of digestive tract: Secondary | ICD-10-CM | POA: Diagnosis not present

## 2017-09-28 DIAGNOSIS — M47816 Spondylosis without myelopathy or radiculopathy, lumbar region: Secondary | ICD-10-CM

## 2017-09-28 DIAGNOSIS — M5127 Other intervertebral disc displacement, lumbosacral region: Secondary | ICD-10-CM | POA: Insufficient documentation

## 2017-09-28 DIAGNOSIS — M549 Dorsalgia, unspecified: Secondary | ICD-10-CM | POA: Diagnosis not present

## 2017-09-28 DIAGNOSIS — S3992XA Unspecified injury of lower back, initial encounter: Secondary | ICD-10-CM | POA: Diagnosis not present

## 2017-09-29 ENCOUNTER — Telehealth: Payer: Self-pay

## 2017-09-29 DIAGNOSIS — Z23 Encounter for immunization: Secondary | ICD-10-CM | POA: Diagnosis not present

## 2017-09-29 DIAGNOSIS — R110 Nausea: Secondary | ICD-10-CM | POA: Diagnosis not present

## 2017-09-29 DIAGNOSIS — N2581 Secondary hyperparathyroidism of renal origin: Secondary | ICD-10-CM | POA: Diagnosis not present

## 2017-09-29 DIAGNOSIS — N186 End stage renal disease: Secondary | ICD-10-CM | POA: Diagnosis not present

## 2017-09-29 DIAGNOSIS — D509 Iron deficiency anemia, unspecified: Secondary | ICD-10-CM | POA: Diagnosis not present

## 2017-09-29 DIAGNOSIS — D631 Anemia in chronic kidney disease: Secondary | ICD-10-CM | POA: Diagnosis not present

## 2017-09-29 NOTE — Telephone Encounter (Signed)
Not able to leave message because "All circuits ar busy".  Will try again.  Thanks [employer]

## 2017-10-01 ENCOUNTER — Encounter: Payer: Self-pay | Admitting: Gastroenterology

## 2017-10-01 ENCOUNTER — Other Ambulatory Visit: Payer: Self-pay

## 2017-10-01 ENCOUNTER — Ambulatory Visit (INDEPENDENT_AMBULATORY_CARE_PROVIDER_SITE_OTHER): Payer: Medicare Other | Admitting: Gastroenterology

## 2017-10-01 VITALS — BP 158/83 | HR 73 | Temp 98.2°F | Ht 64.0 in

## 2017-10-01 DIAGNOSIS — R748 Abnormal levels of other serum enzymes: Secondary | ICD-10-CM

## 2017-10-01 DIAGNOSIS — B182 Chronic viral hepatitis C: Secondary | ICD-10-CM

## 2017-10-01 DIAGNOSIS — K7460 Unspecified cirrhosis of liver: Secondary | ICD-10-CM

## 2017-10-01 DIAGNOSIS — K529 Noninfective gastroenteritis and colitis, unspecified: Secondary | ICD-10-CM | POA: Diagnosis not present

## 2017-10-01 DIAGNOSIS — K7400 Hepatic fibrosis, unspecified: Secondary | ICD-10-CM

## 2017-10-01 NOTE — Progress Notes (Signed)
Cephas Darby, MD [address]  [place], [place] [address]  Main: [phone]  Fax: [phone]    Gastroenterology Consultation  Referring Provider:     Mikey College, * Primary Care Physician:  Mikey College, NP Primary Gastroenterologist:  Dr. Cephas Darby Reason for Consultation:     Chronic hepatitis C, chronic liver disease, chronic diarrhea        HPI:   [name] is a 51 y.o. y/o female referred by Dr. Merrilyn Puma, Jerrel Ivory, NP  for consultation & management of Chronic diarrhea, chronic hepatitis C and cirrhosis  She has multiple comorbidities as listed below. She was initially seen by Dr. Vicente Males in 11/2016 in our clinic for management of chronic hepatitis C and chronic diarrhea. At the time he recommended upper endoscopy, colonoscopy and workup for her liver disease. She reported that she moved to [facility] and was lost to follow-up. Prior to this she was also seen in [facility] clinic in 04/2016 for chronic diarrhea, hepatitis C and dysphagia.  She was lost to follow-up at that time as well. Currently, she lives in [place], [place].   She has been having chronic diarrhea for more than a year intermittently. Most recent episode started about 10 days ago, after she received IV iron and Epogen at the time of dialysis. She reports having about 4-5 episodes of runny, nonbloody bowel movements. She describes that her stool was black when the diarrhea episode started, currently her stools are light yellow in color. She reports upper abdominal pain and associated with significant postprandial bloating. She denies urgency or incontinence, fever or chills, nausea or vomiting. She denies any weight loss, concern about weight gain especially in her waist. She denies swelling of legs. He denies hematemesis or coffee-ground emesis. She does have chronic normocytic anemia, and receives Epogen with her dialysis.  Hepatitis C Diagnosed in 2003- last used cocaine 6  years back , has had professional tatoos, has been incarcerated, no Armed forces logistics/support/administrative officer. Does not drink alcohol presently, no excess use in the past . No family history of liver disease. Genotype 1a,  high viral load, treatment nave, chronic liver disease manifested as splenomegaly, mild thrombocytopenia, hypoalbuminemia, fibrosis score 0.66, stage F3 in 07/2014. She is immune to hepatitis A and B. And, HIV nonreactive   Ferritin 45 in 02/2013 Her hemoglobin A1c is 8.3 in 06/2017 H Pylori stool antigen negative in 11/2016 Has been on oxycodone for chronic back pain   Follow up visit 10/01/2017 She is here for a follow-up earlier than she is scheduled with me. She did not get the labs that I ordered from last visit. She tried to go to the lab on 09/28/2017 but she had an accident in her pants secondary to fecal incontinence and had to leave right to the. She continues to have intermittent nonbloody diarrhea associated with urgency and incontinence. She took Imodium 2 pills 2 days ago. Did not have bowel movement yet. Her colon biopsies revealed focal active colitis with no background of chronicity. She cut back on smoking to 2 cigarettes a day   GI Procedures:  EGD 09/22/2017 - Normal duodenal bulb and second portion of the duodenum. Biopsied. - Non-bleeding erosive gastropathy. Biopsied. - 1 cm hiatal hernia. - Z-line irregular. Biopsied. - Normal esophagus. Colonoscopy 09/22/2017    Past Medical History:  Diagnosis Date  . Allergy   . Anemia   . Anxiety    worse when not at home - bowel  incontinence  . Arthritis   . Ataxia   . CHF (congestive heart failure) (HCC)    history of, has been cleared.  . Chronic kidney disease   . COPD (chronic obstructive pulmonary disease) ([facility])   . Depression   . Diabetes mellitus without complication ([facility])   . Emphysema of lung ([facility])   . End stage renal disease ([facility])    MWF dialysis  . Fistula    left upper arm  . Headache    migraines  .  Hepatitis 2003   Hep C  . Hypercholesterolemia    pt denies  . Hypertension   . Kidney disease   . Neuropathy, diabetic (HCC)    lower legs  . Peripheral vascular disease ([facility])   . Pneumonia 2015  . Psoriasis   . Tobacco dependence   . Wears dentures    full upper, partial lower    Past Surgical History:  Procedure Laterality Date  . AV FISTULA PLACEMENT Left 11/2014  . CESAREAN SECTION    . CHOLECYSTECTOMY    . COLONOSCOPY N/A 09/22/2017   Procedure: COLONOSCOPY;  Surgeon: Lin Landsman, MD;  Location: [facility];  Service: Endoscopy;  Laterality: N/A;  . cyst removed  from left hand Left 1989  . DIALYSIS/PERMA CATHETER INSERTION N/A 05/20/2017   Procedure: Dialysis/Perma Catheter Insertion and fistulagram/LUE angiogram;  Surgeon: Algernon Huxley, MD;  Location: [facility];  Service: Cardiovascular;  Laterality: N/A;  . ESOPHAGOGASTRODUODENOSCOPY N/A 09/22/2017   Procedure: ESOPHAGOGASTRODUODENOSCOPY (EGD);  Surgeon: Lin Landsman, MD;  Location: [facility];  Service: Endoscopy;  Laterality: N/A;  . INCISION AND DRAINAGE ABSCESS N/A 07/16/2016   Procedure: INCISION AND DRAINAGE ABSCESS;  Surgeon: Carloyn Manner, MD;  Location: [facility];  Service: ENT;  Laterality: N/A;  . PERIPHERAL VASCULAR CATHETERIZATION N/A 07/25/2015   Procedure: A/V Shuntogram/Fistulagram;  Surgeon: Algernon Huxley, MD;  Location: [facility];  Service: Cardiovascular;  Laterality: N/A;  . PERIPHERAL VASCULAR CATHETERIZATION Left 07/25/2015   Procedure: A/V Shunt Intervention;  Surgeon: Algernon Huxley, MD;  Location: [facility];  Service: Cardiovascular;  Laterality: Left;  . PERIPHERAL VASCULAR CATHETERIZATION Left 10/07/2015   Procedure: A/V Shuntogram/Fistulagram;  Surgeon: Algernon Huxley, MD;  Location: [facility];  Service: Cardiovascular;  Laterality: Left;  . PERIPHERAL VASCULAR CATHETERIZATION N/A 10/07/2015   Procedure: A/V Shunt Intervention;   Surgeon: Algernon Huxley, MD;  Location: [facility];  Service: Cardiovascular;  Laterality: N/A;  . PERIPHERAL VASCULAR CATHETERIZATION  10/07/2015   Procedure: Dialysis/Perma Catheter Insertion;  Surgeon: Algernon Huxley, MD;  Location: [facility];  Service: Cardiovascular;;  . PERIPHERAL VASCULAR CATHETERIZATION N/A 12/17/2015   Procedure: Dialysis/Perma Catheter Removal;  Surgeon: Katha Cabal, MD;  Location: [facility];  Service: Cardiovascular;  Laterality: N/A;  . PERIPHERAL VASCULAR CATHETERIZATION N/A 01/11/2017   Procedure: Visceral Angiography;  Surgeon: Algernon Huxley, MD;  Location: [facility];  Service: Cardiovascular;  Laterality: N/A;  . PERIPHERAL VASCULAR CATHETERIZATION N/A 01/11/2017   Procedure: Visceral Artery Intervention;  Surgeon: Algernon Huxley, MD;  Location: [facility];  Service: Cardiovascular;  Laterality: N/A;  . POLYPECTOMY  09/22/2017   Procedure: POLYPECTOMY;  Surgeon: Lin Landsman, MD;  Location: [facility];  Service: Endoscopy;;  . rt. tubal and ovary removed    . TONSILLECTOMY    . TUBAL LIGATION      Prior to Admission  medications   Medication Sig Start Date End Date Taking? Authorizing Provider  b complex-vitamin c-folic acid (NEPHRO-VITE) 0.8 MG TABS tablet Take 1 tablet by mouth daily.     [provider]  calcium acetate (PHOSLO) 667 MG capsule Take 1,334 mg by mouth 3 (three) times daily with meals.     [provider]  clobetasol ointment (TEMOVATE) 9.48 % Apply 1 application topically daily as needed (for psorasis).     [provider]  diclofenac sodium (VOLTAREN) 1 % GEL Apply 4 g topically 4 (four) times daily. Patient not taking: Reported on 08/19/2017 08/10/17 11/08/17  Gillis Santa, MD  diltiazem (TIAZAC) 120 MG 24 hr capsule Take 120 mg by mouth 2 (two) times daily. 120 mg in am and 240 mg in the pm 11/22/14   [provider]  gabapentin (NEURONTIN) 100 MG  capsule Take 1 capsule (100 mg total) by mouth 3 (three) times daily. OR 3 capsules at bedtime. 08/19/17   Mikey College, NP  glucose 4 GM chewable tablet Chew 1 tablet (4 g total) by mouth as needed for low blood sugar. 08/19/17   Mikey College, NP  insulin glargine (LANTUS) 100 UNIT/ML injection Inject 15 Units into the skin daily.     [provider]  insulin regular (NOVOLIN R,HUMULIN R) 100 units/mL injection Inject 0.02-0.04 mLs (2-4 Units total) into the skin 3 (three) times daily with meals. 08/19/17   Mikey College, NP  midodrine (PROAMATINE) 5 MG tablet Take 5 mg by mouth as needed (when blood pressure is low).    [provider]  promethazine (PHENERGAN) 25 MG tablet Take 1 tablet (25 mg total) by mouth every 4 (four) hours as needed for nausea or vomiting. 10/19/16   [name], MD  psyllium (METAMUCIL SMOOTH TEXTURE) 28 % packet Take 1-2 packets twice daily. 08/19/17   Mikey College, NP  sevelamer carbonate (RENVELA) 800 MG tablet Take 1,600 mg by mouth 3 (three) times daily with meals.    [provider]    Family History  Problem Relation Age of Onset  . Hypertension Mother   . Heart disease Mother   . Hypertension Father   . Skin cancer Father      Social History  Substance Use Topics  . Smoking status: Light Tobacco Smoker    Packs/day: 0.25    Years: 20.00    Types: Cigarettes    Last attempt to quit: 05/21/2017  . Smokeless tobacco: Never Used  . Alcohol use No    Allergies as of 10/01/2017 - Review Complete 10/01/2017  Allergen Reaction Noted  . Cinnamon Anaphylaxis 10/11/2015  . Garlic Anaphylaxis 54/62/7035  . Tylenol [acetaminophen] Anaphylaxis 07/25/2015  . Ciprofloxacin Diarrhea 11/05/2016  . Onion Hives and Swelling 10/11/2015  . Prednisone Other (See Comments) 01/05/2017    Review of Systems:    All systems reviewed and negative except where noted in HPI.   Physical Exam:  BP (!)  158/83   Pulse 73   Temp 98.2 F (36.8 C) (Oral)   Ht '5\' 4"'$  (1.626 m)  No LMP recorded. Patient is postmenopausal.  General:   Alert,  Well-developed, well-nourished, pleasant and cooperative in NAD Head:  Normocephalic and atraumatic. Eyes:  Sclera clear, no icterus.   Conjunctiva pink. Ears:  Normal auditory acuity. Nose:  No deformity, discharge, or lesions. Mouth:  No deformity or lesions,oropharynx pink & moist. Neck:  Supple; no masses or thyromegaly. Lungs:  Respirations even and  unlabored.  Clear throughout to auscultation.   No wheezes, crackles, or rhonchi. No acute distress. Heart:  Regular rate and rhythm; no murmurs, clicks, rubs, or gallops. Abdomen:  Normal bowel sounds.  Abdominal obesity, significantly redundant skin, abdominal striae present, no appreciable ascites on exam.  Soft, non-tender and non-distended without masses, hepatosplenomegaly or hernias noted.  No guarding or rebound tenderness.   Rectal: Nor performed Msk:  Symmetrical without gross deformities. Good, equal movement & strength bilaterally. Pulses:  Normal pulses noted. Extremities:  No clubbing or edema.  No cyanosis. Neurologic:  Alert and oriented x3;  grossly normal neurologically. Skin:  Psoriatic rash on her right elbow, fingers, No jaundice. Lymph Nodes:  No significant cervical adenopathy. Psych:  Alert and cooperative. Normal mood and affect.  Imaging Studies:  Reviewed  Assessment and Plan:   RONNETTE RUMP is a 51 y.o. White  female with  ESRD on hemodialysis, diabetes on insulin, chronic hepatitis C, genotype 1a, treatment nave, early cirrhosis, well compensated, and chronic intermittent diarrhea with bloating and epigastric pain.  Chronic diarrhea associated with bloating and abdominal pain: Colonic mucosa appeared normal but random colon biopsies revealed mild focal active colitis. Differentials include early IBD or infectious etiology or nonspecific or secondary to bowel prep -  Check stool for C. Difficile and GI pathogen panel, pending - Check ESR, CRP, fecal CALprotectin, pancreatic fecal elastase, fecal lactoferrin pending - Imodium as needed - Further recommendations after the above workup  Advanced fibrosis/early cirrhosis: Secondary to chronic hepatitis C Fibrosure from 2015 suggests F3, she does have mild splenomegaly, thrombocytopenia, hypoalbuminemia, hyperbilirubinemia CPT score 6, class A Complete secondary liver disease workup. She was ANA positive in the past. Antimitochondrial antibodies, anti-smooth muscle antibodies were negative. Ferritin normal. She is immune to hepatitis A and B. Check ceruloplasmin, alpha-1 antitrypsin levels - EGD for variceal screening, up to date 09/22/2017. Repeat EGD in 2020 - No evidence of volume overload, recommend 2 g sodium diet - No evidence of encephalopathy - She does have mild normocytic anemia and thrombocytopenia. No coagulopathy. Ferritin normal - HCC screening - check alpha-fetoprotein pending, ultrasound liver 09/28/2017 up-to-date, no liver lesions  Chronic hepatitis C, genotype 1a, viral load 255,801 units in 08/11/2016: Treatment nave, negative cryoglobulin levels - She is immune to hepatitis A and B. Her hep B surface antigen nonreactive, hep B surface antibody reactive, hep B core antibody nonreactive, hepatitis A IgG reactive in 08/11/2016. Hepatitis E antigen and antibody negative - Recheck CBC, AST, ALT, AP, Tbili, DBili, PT/INR - Testing for the presence of resistance-associated substitutions (RASs) prior to starting treatment - HCV fibrosure to assess degree of fibrosis - Further recommendations on treatment after above workup. If she meets the criteria for treatment, she will need daily fixed-dose combination of elbasvir (50 mg)/grazoprevir (100 mg) for 12weeks with history of end-stage renal disease  Large symptomatic hemorrhoids: She is scheduled for outpatient ligation of internal hemorrhoids on  11/01/2017  Health maintenance: Immune to hepatitis A and B vaccine Received Pneumovax on 09/2012 Recommend annual influenza vaccine Recommend zoster vaccine Colon cancer screening: Colonoscopy on 09/22/2017 revealed tubular adenomas, size greater than 10 mm. Repeat colonoscopy in 09/2020   Follow up in 4 weeks   Cephas Darby, MD

## 2017-10-02 DIAGNOSIS — Z23 Encounter for immunization: Secondary | ICD-10-CM | POA: Diagnosis not present

## 2017-10-02 DIAGNOSIS — D509 Iron deficiency anemia, unspecified: Secondary | ICD-10-CM | POA: Diagnosis not present

## 2017-10-02 DIAGNOSIS — D631 Anemia in chronic kidney disease: Secondary | ICD-10-CM | POA: Diagnosis not present

## 2017-10-02 DIAGNOSIS — N2581 Secondary hyperparathyroidism of renal origin: Secondary | ICD-10-CM | POA: Diagnosis not present

## 2017-10-02 DIAGNOSIS — R110 Nausea: Secondary | ICD-10-CM | POA: Diagnosis not present

## 2017-10-02 DIAGNOSIS — N186 End stage renal disease: Secondary | ICD-10-CM | POA: Diagnosis not present

## 2017-10-04 DIAGNOSIS — D631 Anemia in chronic kidney disease: Secondary | ICD-10-CM | POA: Diagnosis not present

## 2017-10-04 DIAGNOSIS — N186 End stage renal disease: Secondary | ICD-10-CM | POA: Diagnosis not present

## 2017-10-04 DIAGNOSIS — Z23 Encounter for immunization: Secondary | ICD-10-CM | POA: Diagnosis not present

## 2017-10-04 DIAGNOSIS — R110 Nausea: Secondary | ICD-10-CM | POA: Diagnosis not present

## 2017-10-04 DIAGNOSIS — N2581 Secondary hyperparathyroidism of renal origin: Secondary | ICD-10-CM | POA: Diagnosis not present

## 2017-10-04 DIAGNOSIS — D509 Iron deficiency anemia, unspecified: Secondary | ICD-10-CM | POA: Diagnosis not present

## 2017-10-05 ENCOUNTER — Ambulatory Visit
Payer: Medicare Other | Attending: Student in an Organized Health Care Education/Training Program | Admitting: Student in an Organized Health Care Education/Training Program

## 2017-10-05 ENCOUNTER — Encounter: Payer: Self-pay | Admitting: Student in an Organized Health Care Education/Training Program

## 2017-10-05 VITALS — BP 209/86 | HR 87 | Temp 98.4°F | Resp 18 | Ht 67.0 in | Wt 205.0 lb

## 2017-10-05 DIAGNOSIS — K589 Irritable bowel syndrome without diarrhea: Secondary | ICD-10-CM | POA: Diagnosis not present

## 2017-10-05 DIAGNOSIS — M542 Cervicalgia: Secondary | ICD-10-CM | POA: Diagnosis not present

## 2017-10-05 DIAGNOSIS — F419 Anxiety disorder, unspecified: Secondary | ICD-10-CM | POA: Insufficient documentation

## 2017-10-05 DIAGNOSIS — M5116 Intervertebral disc disorders with radiculopathy, lumbar region: Secondary | ICD-10-CM | POA: Insufficient documentation

## 2017-10-05 DIAGNOSIS — K449 Diaphragmatic hernia without obstruction or gangrene: Secondary | ICD-10-CM | POA: Insufficient documentation

## 2017-10-05 DIAGNOSIS — L97529 Non-pressure chronic ulcer of other part of left foot with unspecified severity: Secondary | ICD-10-CM | POA: Diagnosis not present

## 2017-10-05 DIAGNOSIS — G43909 Migraine, unspecified, not intractable, without status migrainosus: Secondary | ICD-10-CM | POA: Diagnosis not present

## 2017-10-05 DIAGNOSIS — I739 Peripheral vascular disease, unspecified: Secondary | ICD-10-CM | POA: Insufficient documentation

## 2017-10-05 DIAGNOSIS — M50121 Cervical disc disorder at C4-C5 level with radiculopathy: Secondary | ICD-10-CM | POA: Insufficient documentation

## 2017-10-05 DIAGNOSIS — R197 Diarrhea, unspecified: Secondary | ICD-10-CM | POA: Insufficient documentation

## 2017-10-05 DIAGNOSIS — F17210 Nicotine dependence, cigarettes, uncomplicated: Secondary | ICD-10-CM | POA: Insufficient documentation

## 2017-10-05 DIAGNOSIS — I132 Hypertensive heart and chronic kidney disease with heart failure and with stage 5 chronic kidney disease, or end stage renal disease: Secondary | ICD-10-CM | POA: Insufficient documentation

## 2017-10-05 DIAGNOSIS — Z8614 Personal history of Methicillin resistant Staphylococcus aureus infection: Secondary | ICD-10-CM | POA: Diagnosis not present

## 2017-10-05 DIAGNOSIS — E1142 Type 2 diabetes mellitus with diabetic polyneuropathy: Secondary | ICD-10-CM

## 2017-10-05 DIAGNOSIS — Z992 Dependence on renal dialysis: Secondary | ICD-10-CM | POA: Diagnosis not present

## 2017-10-05 DIAGNOSIS — M4802 Spinal stenosis, cervical region: Secondary | ICD-10-CM | POA: Diagnosis not present

## 2017-10-05 DIAGNOSIS — E1122 Type 2 diabetes mellitus with diabetic chronic kidney disease: Secondary | ICD-10-CM | POA: Insufficient documentation

## 2017-10-05 DIAGNOSIS — M4726 Other spondylosis with radiculopathy, lumbar region: Secondary | ICD-10-CM | POA: Insufficient documentation

## 2017-10-05 DIAGNOSIS — E11311 Type 2 diabetes mellitus with unspecified diabetic retinopathy with macular edema: Secondary | ICD-10-CM | POA: Insufficient documentation

## 2017-10-05 DIAGNOSIS — M5010 Cervical disc disorder with radiculopathy, unspecified cervical region: Secondary | ICD-10-CM | POA: Diagnosis not present

## 2017-10-05 DIAGNOSIS — M5125 Other intervertebral disc displacement, thoracolumbar region: Secondary | ICD-10-CM | POA: Insufficient documentation

## 2017-10-05 DIAGNOSIS — M47816 Spondylosis without myelopathy or radiculopathy, lumbar region: Secondary | ICD-10-CM | POA: Diagnosis not present

## 2017-10-05 DIAGNOSIS — M5412 Radiculopathy, cervical region: Secondary | ICD-10-CM

## 2017-10-05 DIAGNOSIS — J449 Chronic obstructive pulmonary disease, unspecified: Secondary | ICD-10-CM | POA: Insufficient documentation

## 2017-10-05 DIAGNOSIS — I10 Essential (primary) hypertension: Secondary | ICD-10-CM | POA: Diagnosis not present

## 2017-10-05 DIAGNOSIS — M5030 Other cervical disc degeneration, unspecified cervical region: Secondary | ICD-10-CM | POA: Diagnosis not present

## 2017-10-05 DIAGNOSIS — F4325 Adjustment disorder with mixed disturbance of emotions and conduct: Secondary | ICD-10-CM | POA: Insufficient documentation

## 2017-10-05 DIAGNOSIS — M5416 Radiculopathy, lumbar region: Secondary | ICD-10-CM

## 2017-10-05 DIAGNOSIS — F329 Major depressive disorder, single episode, unspecified: Secondary | ICD-10-CM | POA: Diagnosis not present

## 2017-10-05 DIAGNOSIS — D649 Anemia, unspecified: Secondary | ICD-10-CM | POA: Insufficient documentation

## 2017-10-05 DIAGNOSIS — M47812 Spondylosis without myelopathy or radiculopathy, cervical region: Secondary | ICD-10-CM | POA: Diagnosis not present

## 2017-10-05 DIAGNOSIS — I509 Heart failure, unspecified: Secondary | ICD-10-CM | POA: Insufficient documentation

## 2017-10-05 DIAGNOSIS — B1920 Unspecified viral hepatitis C without hepatic coma: Secondary | ICD-10-CM | POA: Diagnosis not present

## 2017-10-05 DIAGNOSIS — L4050 Arthropathic psoriasis, unspecified: Secondary | ICD-10-CM | POA: Insufficient documentation

## 2017-10-05 DIAGNOSIS — Z794 Long term (current) use of insulin: Secondary | ICD-10-CM | POA: Insufficient documentation

## 2017-10-05 DIAGNOSIS — N189 Chronic kidney disease, unspecified: Secondary | ICD-10-CM | POA: Insufficient documentation

## 2017-10-05 DIAGNOSIS — E875 Hyperkalemia: Secondary | ICD-10-CM | POA: Insufficient documentation

## 2017-10-05 DIAGNOSIS — E7800 Pure hypercholesterolemia, unspecified: Secondary | ICD-10-CM | POA: Insufficient documentation

## 2017-10-05 MED ORDER — OXYCODONE HCL 5 MG PO TABS: 5.0000 mg | ORAL_TABLET | Freq: Three times a day (TID) | ORAL | 0 refills | Status: DC | PRN
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
## 2017-10-05 NOTE — Progress Notes (Signed)
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 radiculopathy   7. Lumbar radiculopathy    51 year old female with a complex past medical history including anxiety, end-stage kidney disease on dialysis, depression, diabetes, migraines, hepatitis C, peripheral vascular disease who presents with chronic neck, right upper quadrant, and back pain. Patient previously seen by Dr. Humphrey Rolls of [place] [facility]. She was previously on fentanyl patch at 23 mics has also tried oxycodone and OxyContin in the past which were not effective and resulted in  nausea and mental status changes. Patient's cervical MRI shows C4-C5 right foraminal disc protrusion and moderate foraminal stenosis and C5-C6 broad central disc protrusion compressing the ventral cervical spinal cord with moderate spinal canal stenosis. Lumbar MRI shows disc protrusion at L5-S1. Patient low risk for substance abuse disorder per pain psych evaluation.  At today's visit we'll review the patient's lumbar and  cervical MRI. I offered her lumbar and cervical epidural steroid injections. Patient states that she is concerned about her blood glucose control and wants to avoid any steroid therapy at the moment so that she can try to get her blood glucose better controlled. This is reasonable. I will place a when necessary order for a lumbar and cervical epidural steroid injection that the patient can call and request when she feels that her neck or low back pain are flaring up to the extent that she needs an injection. Patient is not on any blood thinners. Will consider doing a right C7-T1 ESI for neck and shoulder pain and a L5-S1 ESI for low back and leg pain.  Patient will also complete opioid agreement. We will start oxycodone 5 mg up to 3 times a day as needed for breakthrough pain.  Plan: -Opioid agreement with our clinic -Oxycodone 5 mg 3 times a day when necessary, quantity 90 for one month -When necessary right C7-T1 ESI for cervical radiculopathy -When necessary L5-S1 ESI for lumbar radiculopathy -follow-up one month. right-sided neck pain can consider right C4-C5 C6-C7 medial branch nerve block diagnostic.   Plan of Care  Pharmacotherapy (Medications Ordered): Meds ordered this encounter  Medications  . oxyCODONE (OXY IR/ROXICODONE) 5 MG immediate release tablet    Sig: Take 1 tablet (5 mg total) by mouth 3 (three) times daily as needed for severe pain. For chronic pain To last 30 days from fill date    Dispense:  90 tablet    Refill:  0    Do not place this medication, or any other prescription from our practice, on "Automatic Refill". Patient may have prescription filled one day early if pharmacy is closed on scheduled refill date.   Lab-work, procedure(s), and/or referral(s): Orders Placed This Encounter  Procedures  . Cervical Epidural Injection  . Lumbar Epidural Injection    Pharmacological management options:  Opioid Analgesics: The patient was informed that there is no  guarantee that she would be a candidate for opioid analgesics. The decision will be made following CDC guidelines. This decision will be based on the results of diagnostic studies, as well as Ms. Bellomo's risk profile. Depending UDS and pain psychology, consider oxycodone 5 mg 3 times a day when necessary   Membrane stabilizer: Tried and failed gabapentin and Lyrica resulted in severe nausea   Muscle relaxant: To be determined at a later time  NSAID: Medically contraindicated  Other analgesic(s): To be determined at a later time will try Voltaren gel today. Patient limited on analgesics given end-stage renal disease on dialysis.    Interventional management options: Ms. Harries was informed that there is no guarantee that she would be a candidate for interventional therapies. The decision will be based on the results of diagnostic studies, as well as Ms. Kawano's risk profile.  Procedure(s) under consideration:  -bilateralC4, C5, C6, C7 cervical medial branch nerve blocks with goal radio frequency ablation -right C7-T1Cervical epidural steroid injection -L5-S1 epidural steroid -Right transversus abdominis plane block with ultrasound guidance -Lumbar medial branch nerve blocks with goal radio frequency ablation -SI joint injection.  -Trigger point  injections for myofascial pain of trapezius and cervical paraspinals      Provider-requested follow-up: Return in about 4 weeks (around 11/02/2017) for Medication Management.  Future Appointments Date Time Provider [place]  10/26/2017 11:00 AM Mikey College, NP [facility] None  11/01/2017 10:00 AM Lin Landsman, MD [facility] None  11/09/2017 8:30 AM Gillis Santa, MD [facility] None    Primary Care Physician: Mikey College, NP Location: [facility] Facility Note by: Gillis Santa, M.D Date: 10/05/2017; Time: 1:18 PM  Patient Instructions  1. Sign opioid agreement 2. 1 month Rx for Oxycodone 5 mg  up to three times a day as needed 3. Standing order for cervical and lumbar epidural steroid injection- can call to schedule when pain flare  You have been given a script for oxycodone today to last until 11-04-17.  I have given you pre procedure instructions without sedation       . Preparing for your procedure (without sedation) Instructions: . Oral Intake: Do not eat or drink anything for at least 3 hours prior to your procedure. . Transportation: Unless otherwise stated by your physician, you may drive yourself after the procedure. . Blood Pressure Medicine: Take your blood pressure medicine with a sip of water the morning of the procedure. . Insulin: Take only  of your normal insulin dose. . Preventing infections: Shower with an antibacterial soap the morning of your procedure. . Build-up your immune system: Take 1000 mg of Vitamin C with every meal (3 times a day) the day prior to your procedure. . Pregnancy: If you are pregnant, call and cancel the procedure. . Sickness: If you have a cold, fever, or any active infections, call and cancel the procedure. . Arrival: You must be in the facility at least 30 minutes prior to your scheduled procedure. . Children: Do not bring any children with you. . Dress appropriately: Bring dark clothing that you would not mind if they get stained. . Valuables: Do not bring any jewelry or valuables. Procedure appointments are reserved for interventional treatments only. Marland Kitchen No Prescription Refills. . No medication changes will be discussed during procedure appointments. No disability issues will be discussed.

## 2017-10-05 NOTE — Patient Instructions (Addendum)
1. Sign opioid agreement 2. 1 month Rx for Oxycodone 5 mg up to three times a day as needed 3. Standing order for cervical and lumbar epidural steroid injection- can call to schedule when pain flare  You have been given a script for oxycodone today to last until 11-04-17.  I have given you pre procedure instructions without sedation       . Preparing for your procedure (without sedation) Instructions: . Oral Intake: Do not eat or drink anything for at least 3 hours prior to your procedure. . Transportation: Unless otherwise stated by your physician, you may drive yourself after the procedure. . Blood Pressure Medicine: Take your blood pressure medicine with a sip of water the morning of the procedure. . Insulin: Take only  of your normal insulin dose. . Preventing infections: Shower with an antibacterial soap the morning of your procedure. . Build-up your immune system: Take 1000 mg of Vitamin C with every meal (3 times a day) the day prior to your procedure. . Pregnancy: If you are pregnant, call and cancel the procedure. . Sickness: If you have a cold, fever, or any active infections, call and cancel the procedure. . Arrival: You must be in the facility at least 30 minutes prior to your scheduled procedure. . Children: Do not bring any children with you. . Dress appropriately: Bring dark clothing that you would not mind if they get stained. . Valuables: Do not bring any jewelry or valuables. Procedure appointments are reserved for interventional treatments only. Marland Kitchen No Prescription Refills. . No medication changes will be discussed during procedure appointments. No disability issues will be discussed.

## 2017-10-05 NOTE — Progress Notes (Signed)
Safety precautions to be maintained throughout the outpatient stay will include: orient to surroundings, keep bed in low position, maintain call bell within reach at all times, provide assistance with transfer out of bed and ambulation.  

## 2017-10-08 DIAGNOSIS — N186 End stage renal disease: Secondary | ICD-10-CM | POA: Diagnosis not present

## 2017-10-08 DIAGNOSIS — Z23 Encounter for immunization: Secondary | ICD-10-CM | POA: Diagnosis not present

## 2017-10-08 DIAGNOSIS — D631 Anemia in chronic kidney disease: Secondary | ICD-10-CM | POA: Diagnosis not present

## 2017-10-08 DIAGNOSIS — R110 Nausea: Secondary | ICD-10-CM | POA: Diagnosis not present

## 2017-10-08 DIAGNOSIS — D509 Iron deficiency anemia, unspecified: Secondary | ICD-10-CM | POA: Diagnosis not present

## 2017-10-08 DIAGNOSIS — N2581 Secondary hyperparathyroidism of renal origin: Secondary | ICD-10-CM | POA: Diagnosis not present

## 2017-10-11 ENCOUNTER — Telehealth: Payer: Self-pay

## 2017-10-11 DIAGNOSIS — D631 Anemia in chronic kidney disease: Secondary | ICD-10-CM | POA: Diagnosis not present

## 2017-10-11 DIAGNOSIS — Z794 Long term (current) use of insulin: Secondary | ICD-10-CM | POA: Diagnosis not present

## 2017-10-11 DIAGNOSIS — N186 End stage renal disease: Secondary | ICD-10-CM | POA: Diagnosis not present

## 2017-10-11 DIAGNOSIS — Z23 Encounter for immunization: Secondary | ICD-10-CM | POA: Diagnosis not present

## 2017-10-11 DIAGNOSIS — N2581 Secondary hyperparathyroidism of renal origin: Secondary | ICD-10-CM | POA: Diagnosis not present

## 2017-10-11 DIAGNOSIS — R110 Nausea: Secondary | ICD-10-CM | POA: Diagnosis not present

## 2017-10-11 DIAGNOSIS — D509 Iron deficiency anemia, unspecified: Secondary | ICD-10-CM | POA: Diagnosis not present

## 2017-10-11 DIAGNOSIS — E119 Type 2 diabetes mellitus without complications: Secondary | ICD-10-CM | POA: Diagnosis not present

## 2017-10-11 NOTE — Telephone Encounter (Signed)
Patient called to notify us that she had received Xanax .25 mg  from her PCP to help with anxiety and dialysis.  Spoke with Dr Holley Raring and he states he will talk about it with her at her next visit.  States it is OK for her to fill the xanax now.  Patient notified.

## 2017-10-12 ENCOUNTER — Other Ambulatory Visit
Admission: RE | Admit: 2017-10-12 | Discharge: 2017-10-12 | Disposition: A | Discharge status: Home / Self Care | Payer: Medicare Other | Source: Ambulatory Visit | Attending: Gastroenterology | Admitting: Gastroenterology

## 2017-10-12 ENCOUNTER — Ambulatory Visit: Payer: Medicare Other | Admitting: Gastroenterology

## 2017-10-12 DIAGNOSIS — B351 Tinea unguium: Secondary | ICD-10-CM | POA: Diagnosis not present

## 2017-10-12 DIAGNOSIS — B182 Chronic viral hepatitis C: Secondary | ICD-10-CM | POA: Diagnosis not present

## 2017-10-12 DIAGNOSIS — L851 Acquired keratosis [keratoderma] palmaris et plantaris: Secondary | ICD-10-CM | POA: Diagnosis not present

## 2017-10-12 DIAGNOSIS — K740 Hepatic fibrosis: Secondary | ICD-10-CM | POA: Insufficient documentation

## 2017-10-12 DIAGNOSIS — E1140 Type 2 diabetes mellitus with diabetic neuropathy, unspecified: Secondary | ICD-10-CM | POA: Diagnosis not present

## 2017-10-12 DIAGNOSIS — Z794 Long term (current) use of insulin: Secondary | ICD-10-CM | POA: Diagnosis not present

## 2017-10-12 LAB — CBC WITH DIFFERENTIAL/PLATELET
Basophils Absolute: 0.1 10*3/uL (ref 0–0.1)
Basophils Relative: 1 %
Eosinophils Absolute: 0.1 10*3/uL (ref 0–0.7)
Eosinophils Relative: 2 %
HCT: 33.5 % — ABNORMAL LOW (ref 35.0–47.0)
HEMOGLOBIN: 11.1 g/dL — AB (ref 12.0–16.0)
LYMPHS ABS: 1.3 10*3/uL (ref 1.0–3.6)
LYMPHS PCT: 23 %
MCH: 30.9 pg (ref 26.0–34.0)
MCHC: 33.1 g/dL (ref 32.0–36.0)
MCV: 93.4 fL (ref 80.0–100.0)
Monocytes Absolute: 0.6 10*3/uL (ref 0.2–0.9)
Monocytes Relative: 11 %
NEUTROS PCT: 63 %
Neutro Abs: 3.4 10*3/uL (ref 1.4–6.5)
Platelets: 178 10*3/uL (ref 150–440)
RBC: 3.58 MIL/uL — AB (ref 3.80–5.20)
RDW: 15.5 % — ABNORMAL HIGH (ref 11.5–14.5)
WBC: 5.4 10*3/uL (ref 3.6–11.0)

## 2017-10-12 LAB — PROTIME-INR
INR: 1.01
Prothrombin Time: 13.2 seconds (ref 11.4–15.2)

## 2017-10-12 LAB — HEPATIC FUNCTION PANEL
ALBUMIN: 3.6 g/dL (ref 3.5–5.0)
ALT: 52 U/L (ref 14–54)
AST: 40 U/L (ref 15–41)
Alkaline Phosphatase: 50 U/L (ref 38–126)
BILIRUBIN TOTAL: 0.8 mg/dL (ref 0.3–1.2)
Bilirubin, Direct: 0.1 mg/dL (ref 0.1–0.5)
Indirect Bilirubin: 0.7 mg/dL (ref 0.3–0.9)
Total Protein: 8.4 g/dL — ABNORMAL HIGH (ref 6.5–8.1)

## 2017-10-12 LAB — SEDIMENTATION RATE: Sed Rate: 117 mm/hr — ABNORMAL HIGH (ref 0–30)

## 2017-10-13 ENCOUNTER — Emergency Department: Payer: Medicare Other

## 2017-10-13 ENCOUNTER — Encounter: Payer: Self-pay | Admitting: Emergency Medicine

## 2017-10-13 ENCOUNTER — Emergency Department
Admission: EM | Admit: 2017-10-13 | Discharge: 2017-10-13 | Disposition: A | Discharge status: Home / Self Care | Payer: Medicare Other | Attending: Emergency Medicine | Admitting: Emergency Medicine

## 2017-10-13 ENCOUNTER — Encounter: Payer: Self-pay | Admitting: Gastroenterology

## 2017-10-13 DIAGNOSIS — E113393 Type 2 diabetes mellitus with moderate nonproliferative diabetic retinopathy without macular edema, bilateral: Secondary | ICD-10-CM | POA: Insufficient documentation

## 2017-10-13 DIAGNOSIS — Z79899 Other long term (current) drug therapy: Secondary | ICD-10-CM | POA: Diagnosis not present

## 2017-10-13 DIAGNOSIS — E1140 Type 2 diabetes mellitus with diabetic neuropathy, unspecified: Secondary | ICD-10-CM | POA: Insufficient documentation

## 2017-10-13 DIAGNOSIS — Z940 Kidney transplant status: Secondary | ICD-10-CM | POA: Insufficient documentation

## 2017-10-13 DIAGNOSIS — R079 Chest pain, unspecified: Secondary | ICD-10-CM | POA: Diagnosis not present

## 2017-10-13 DIAGNOSIS — M79601 Pain in right arm: Secondary | ICD-10-CM | POA: Diagnosis not present

## 2017-10-13 DIAGNOSIS — M542 Cervicalgia: Secondary | ICD-10-CM | POA: Diagnosis not present

## 2017-10-13 DIAGNOSIS — F17210 Nicotine dependence, cigarettes, uncomplicated: Secondary | ICD-10-CM | POA: Insufficient documentation

## 2017-10-13 DIAGNOSIS — I509 Heart failure, unspecified: Secondary | ICD-10-CM | POA: Diagnosis not present

## 2017-10-13 DIAGNOSIS — E1151 Type 2 diabetes mellitus with diabetic peripheral angiopathy without gangrene: Secondary | ICD-10-CM | POA: Diagnosis not present

## 2017-10-13 DIAGNOSIS — M5412 Radiculopathy, cervical region: Secondary | ICD-10-CM | POA: Diagnosis not present

## 2017-10-13 DIAGNOSIS — I132 Hypertensive heart and chronic kidney disease with heart failure and with stage 5 chronic kidney disease, or end stage renal disease: Secondary | ICD-10-CM | POA: Diagnosis not present

## 2017-10-13 DIAGNOSIS — N186 End stage renal disease: Secondary | ICD-10-CM | POA: Insufficient documentation

## 2017-10-13 DIAGNOSIS — Z992 Dependence on renal dialysis: Secondary | ICD-10-CM | POA: Diagnosis not present

## 2017-10-13 DIAGNOSIS — E1122 Type 2 diabetes mellitus with diabetic chronic kidney disease: Secondary | ICD-10-CM | POA: Diagnosis not present

## 2017-10-13 DIAGNOSIS — J449 Chronic obstructive pulmonary disease, unspecified: Secondary | ICD-10-CM | POA: Diagnosis not present

## 2017-10-13 DIAGNOSIS — Z794 Long term (current) use of insulin: Secondary | ICD-10-CM | POA: Diagnosis not present

## 2017-10-13 DIAGNOSIS — M25511 Pain in right shoulder: Secondary | ICD-10-CM | POA: Diagnosis not present

## 2017-10-13 LAB — TROPONIN I

## 2017-10-13 LAB — BASIC METABOLIC PANEL
Anion gap: 16 — ABNORMAL HIGH (ref 5–15)
BUN: 65 mg/dL — AB (ref 6–20)
CALCIUM: 7.4 mg/dL — AB (ref 8.9–10.3)
CO2: 22 mmol/L (ref 22–32)
CREATININE: 6.89 mg/dL — AB (ref 0.44–1.00)
Chloride: 96 mmol/L — ABNORMAL LOW (ref 101–111)
GFR calc Af Amer: 7 mL/min — ABNORMAL LOW (ref >60)
GFR, EST NON AFRICAN AMERICAN: 6 mL/min — AB (ref >60)
Glucose, Bld: 200 mg/dL — ABNORMAL HIGH (ref 65–99)
Potassium: 4.9 mmol/L (ref 3.5–5.1)
SODIUM: 134 mmol/L — AB (ref 135–145)

## 2017-10-13 LAB — HEPATIC FUNCTION PANEL
ALK PHOS: 49 U/L (ref 38–126)
ALT: 61 U/L — ABNORMAL HIGH (ref 14–54)
AST: 53 U/L — ABNORMAL HIGH (ref 15–41)
Albumin: 3.7 g/dL (ref 3.5–5.0)
Bilirubin, Direct: <0.1 mg/dL — ABNORMAL LOW (ref 0.1–0.5)
TOTAL PROTEIN: 8.5 g/dL — AB (ref 6.5–8.1)
Total Bilirubin: 0.8 mg/dL (ref 0.3–1.2)

## 2017-10-13 LAB — CBC
HCT: 31.9 % — ABNORMAL LOW (ref 35.0–47.0)
Hemoglobin: 10.9 g/dL — ABNORMAL LOW (ref 12.0–16.0)
MCH: 32 pg (ref 26.0–34.0)
MCHC: 34.2 g/dL (ref 32.0–36.0)
MCV: 93.4 fL (ref 80.0–100.0)
PLATELETS: 161 10*3/uL (ref 150–440)
RBC: 3.41 MIL/uL — ABNORMAL LOW (ref 3.80–5.20)
RDW: 15.3 % — AB (ref 11.5–14.5)
WBC: 6.2 10*3/uL (ref 3.6–11.0)

## 2017-10-13 LAB — AFP TUMOR MARKER: AFP, Serum, Tumor Marker: 1.7 ng/mL (ref 0.0–8.3)

## 2017-10-13 LAB — LIPASE, BLOOD: LIPASE: 22 U/L (ref 11–51)

## 2017-10-13 LAB — ANTINUCLEAR ANTIBODIES, IFA: ANTINUCLEAR ANTIBODIES, IFA: NEGATIVE

## 2017-10-13 MED ORDER — MORPHINE SULFATE (PF) 4 MG/ML IV SOLN
4.0000 mg | Freq: Once | INTRAVENOUS | Status: AC
  Administered 2017-10-13: 4 mg via INTRAVENOUS
  Filled 2017-10-13: qty 1

## 2017-10-13 MED ORDER — LORAZEPAM 2 MG/ML IJ SOLN
1.0000 mg | Freq: Once | INTRAMUSCULAR | Status: AC
Start: 2017-10-13 — End: 2017-10-13
  Administered 2017-10-13: 1 mg via INTRAVENOUS
  Filled 2017-10-13: qty 1

## 2017-10-13 MED ORDER — KETOROLAC TROMETHAMINE 30 MG/ML IJ SOLN: 15.0000 mg | INTRAMUSCULAR | Status: DC

## 2017-10-13 MED ORDER — HYDROMORPHONE HCL 1 MG/ML IJ SOLN
1.0000 mg | Freq: Once | INTRAMUSCULAR | Status: AC
  Administered 2017-10-13: 1 mg via INTRAVENOUS

## 2017-10-13 MED ORDER — HYDROMORPHONE HCL 1 MG/ML IJ SOLN
INTRAMUSCULAR | Status: AC
  Filled 2017-10-13: qty 1

## 2017-10-13 MED ORDER — ONDANSETRON HCL 4 MG/2ML IJ SOLN
4.0000 mg | Freq: Once | INTRAMUSCULAR | Status: AC
  Administered 2017-10-13: 4 mg via INTRAVENOUS
  Filled 2017-10-13: qty 2

## 2017-10-13 NOTE — ED Provider Notes (Signed)
Clinical Course as of Oct 14 1843  Wed Oct 13, 2017  1530 Care d/w [name] to facilitate make-up HD tomorrow. He  Will contact her HD facility  [PS]  1843 MRI shows progression of c spine degenerative disease and radiculopathy.  D/w nsgy Izora Ribas, will have clinic contact her tomorrow for follow up. She has 7am appt at HD courtesy of Dr. [name]. Pt reports she can get a ride home and can find a way to HD in the morning.   [PS]    Clinical Course User Index [PS] Carrie Mew, MD     ----------------------------------------- 6:45 PM on 10/13/2017 -----------------------------------------  Patient reports inability to take NSAIDs or Tylenol or steroids. Unfortunately she has no treatment options for pharmacologic management of her pain except for opioids. She is already in a pain management clinic. I will discharge without prescriptions to outpatient follow-up.  Final diagnoses:  Pain of right upper extremity  Cervical radiculopathy at C5      Carrie Mew, MD 10/13/17 1845

## 2017-10-13 NOTE — ED Notes (Signed)

## 2017-10-13 NOTE — ED Notes (Signed)
Upon reassessment of pain pt states " arm is better, but my stomach hurts", pt tearful. MD made aware

## 2017-10-13 NOTE — ED Provider Notes (Signed)
[facility] Emergency Department Provider Note  ____________________________________________   First MD Initiated Contact with Patient 10/13/17 1107     (approximate)  I have reviewed the triage vital signs and the nursing notes.   HISTORY  Chief Complaint Extremity Pain   HPI [name] is a 51 y.o. female with a history of CHF, COPD as well as end-stage renal disease on dialysis was presenting to the emergency department today with right upper extremity pain that is worsening over the past month. She says the pain started as an ache in her bicep and is spread to the right side of the neck and now radiates all the way down the left hand. Says that her hand feels hot and cold and tingly occasionally. Denies any chest pain or shortness of breath. Says that she was supposed to have a CT of the neck/cervical spine ordered by her nephrologist. Concern seems to be for radiculopathy. Patient taking Percocet at home without pain relief.Patient last dialyzed this past Monday and is scheduled for dialysis today but came to the emergency department instead.   Past Medical History:  Diagnosis Date  . Allergy   . Anemia   . Anxiety    worse when not at home - bowel incontinence  . Arthritis   . Ataxia   . CHF (congestive heart failure) (HCC)    history of, has been cleared.  . Chronic kidney disease   . COPD (chronic obstructive pulmonary disease) ([place])   . Depression   . Diabetes mellitus without complication ([place])   . Emphysema of lung ([place])   . End stage renal disease ([place])    MWF dialysis  . Fistula    left upper arm  . Headache    migraines  . Hepatitis 2003   Hep C  . Hiatal hernia   . Hypercholesterolemia    pt denies  . Hypertension   . Kidney disease   . Neuropathy, diabetic (HCC)    lower legs  . Peripheral vascular disease ([place])   . Pneumonia 2015  . Psoriasis   . Tobacco dependence   . Wears dentures    full upper, partial lower     Patient Active Problem List   Diagnosis Date Noted  . Abdominal pain, chronic, right lower quadrant 09/14/2017  . Diarrhea 09/14/2017  . Incontinence of feces 09/14/2017  . Stool incontinence 08/19/2017  . Irritable bowel syndrome (IBS) 07/20/2017  . Chronic bilateral low back pain 07/20/2017  . Anxiety 07/02/2017  . Dialysis AV fistula malfunction ([place]) 05/19/2017  . Hyperkalemia 01/08/2017  . Generalized abdominal pain 12/31/2016  . Diabetes mellitus type 2, uncontrolled, with complications ([place]) 53/61/4431  . SMA stenosis ([place]) 12/18/2016  . Difficulty walking 11/26/2016  . Neuropathy due to type 2 diabetes mellitus ([place]) 11/26/2016  . Neck pain 11/26/2016  . Kidney transplant candidate 08/11/2016  . History of MRSA infection 07/18/2016  . Chronic ulcer of left foot ([place]) 07/18/2016  . Acquired palmar and plantar hyperkeratosis 07/18/2016  . Essential hypertension 07/18/2016  . Tobacco abuse 07/18/2016  . ESRD (end stage renal disease) on dialysis ([place]) 07/18/2016  . Adjustment disorder with mixed disturbance of emotions and conduct 05/11/2016  . Dysthymia 05/11/2016  . Diabetic macular edema ([place]) 08/24/2013  . Hypertensive retinopathy of both eyes 08/24/2013  . Non-proliferative diabetic retinopathy, severe, both eyes ([place]) 08/24/2013  . Psoriatic arthropathy ([place]) 11/14/2012    Past Surgical History:  Procedure Laterality Date  . AV FISTULA PLACEMENT  Left 11/2014  . CESAREAN SECTION    . CHOLECYSTECTOMY    . COLONOSCOPY N/A 09/22/2017   Procedure: COLONOSCOPY;  Surgeon: Lin Landsman, MD;  Location: [place];  Service: Endoscopy;  Laterality: N/A;  . cyst removed  from left hand Left 1989  . DIALYSIS/PERMA CATHETER INSERTION N/A 05/20/2017   Procedure: Dialysis/Perma Catheter Insertion and fistulagram/LUE angiogram;  Surgeon: Algernon Huxley, MD;  Location: [place] [facility];  Service: Cardiovascular;  Laterality: N/A;  . ESOPHAGOGASTRODUODENOSCOPY  N/A 09/22/2017   Procedure: ESOPHAGOGASTRODUODENOSCOPY (EGD);  Surgeon: Lin Landsman, MD;  Location: [place];  Service: Endoscopy;  Laterality: N/A;  . INCISION AND DRAINAGE ABSCESS N/A 07/16/2016   Procedure: INCISION AND DRAINAGE ABSCESS;  Surgeon: Carloyn Manner, MD;  Location: [facility];  Service: ENT;  Laterality: N/A;  . PERIPHERAL VASCULAR CATHETERIZATION N/A 07/25/2015   Procedure: A/V Shuntogram/Fistulagram;  Surgeon: Algernon Huxley, MD;  Location: [place] [facility];  Service: Cardiovascular;  Laterality: N/A;  . PERIPHERAL VASCULAR CATHETERIZATION Left 07/25/2015   Procedure: A/V Shunt Intervention;  Surgeon: Algernon Huxley, MD;  Location: [place] [facility];  Service: Cardiovascular;  Laterality: Left;  . PERIPHERAL VASCULAR CATHETERIZATION Left 10/07/2015   Procedure: A/V Shuntogram/Fistulagram;  Surgeon: Algernon Huxley, MD;  Location: [place] [facility];  Service: Cardiovascular;  Laterality: Left;  . PERIPHERAL VASCULAR CATHETERIZATION N/A 10/07/2015   Procedure: A/V Shunt Intervention;  Surgeon: Algernon Huxley, MD;  Location: [place] [facility];  Service: Cardiovascular;  Laterality: N/A;  . PERIPHERAL VASCULAR CATHETERIZATION  10/07/2015   Procedure: Dialysis/Perma Catheter Insertion;  Surgeon: Algernon Huxley, MD;  Location: [place] [facility];  Service: Cardiovascular;;  . PERIPHERAL VASCULAR CATHETERIZATION N/A 12/17/2015   Procedure: Dialysis/Perma Catheter Removal;  Surgeon: Katha Cabal, MD;  Location: [place] [facility];  Service: Cardiovascular;  Laterality: N/A;  . PERIPHERAL VASCULAR CATHETERIZATION N/A 01/11/2017   Procedure: Visceral Angiography;  Surgeon: Algernon Huxley, MD;  Location: [place] [facility];  Service: Cardiovascular;  Laterality: N/A;  . PERIPHERAL VASCULAR CATHETERIZATION N/A 01/11/2017   Procedure: Visceral Artery Intervention;  Surgeon: Algernon Huxley, MD;  Location: [place] [facility];  Service: Cardiovascular;  Laterality: N/A;  .  POLYPECTOMY  09/22/2017   Procedure: POLYPECTOMY;  Surgeon: Lin Landsman, MD;  Location: [place];  Service: Endoscopy;;  . rt. tubal and ovary removed    . TONSILLECTOMY    . TUBAL LIGATION      Prior to Admission medications   Medication Sig Start Date End Date Taking? Authorizing Provider  b complex-vitamin c-folic acid (NEPHRO-VITE) 0.8 MG TABS tablet Take 1 tablet by mouth daily.    Yes [provider]  calcium acetate (PHOSLO) 667 MG capsule Take 1,334 mg by mouth 3 (three) times daily with meals.    Yes [provider]  diclofenac sodium (VOLTAREN) 1 % GEL Apply 4 g topically 4 (four) times daily. 08/10/17 11/08/17 Yes Gillis Santa, MD  diltiazem (TIAZAC) 120 MG 24 hr capsule Take 240 mg by mouth 2 (two) times daily.  11/22/14  Yes [provider]  insulin glargine (LANTUS) 100 UNIT/ML injection Inject 15 Units into the skin daily.    Yes [provider]  insulin regular (HUMULIN R) 100 units/mL injection Inject as directed Three (3) times a day with a meal.   Yes [provider]  oxyCODONE (OXY IR/ROXICODONE) 5 MG immediate release tablet Take 1 tablet (5 mg total) by  mouth 3 (three) times daily as needed for severe pain. For chronic pain To last 30 days from fill date 10/05/17  Yes Gillis Santa, MD  promethazine (PHENERGAN) 25 MG tablet Take 1 tablet (25 mg total) by mouth every 4 (four) hours as needed for nausea or vomiting. 10/19/16  Yes [name], MD  sevelamer carbonate (RENVELA) 800 MG tablet Take 1,600 mg by mouth 3 (three) times daily with meals.   Yes [provider]  clobetasol ointment (TEMOVATE) 1.61 % Apply 1 application topically daily as needed (for psorasis).     [provider]  gabapentin (NEURONTIN) 100 MG capsule Take 1 capsule (100 mg total) by mouth 3 (three) times daily. OR 3 capsules at bedtime. Patient not taking: Reported on 10/05/2017 08/19/17   Mikey College, NP    glucose 4 GM chewable tablet Chew 1 tablet (4 g total) by mouth as needed for low blood sugar. 08/19/17   Mikey College, NP  insulin regular (NOVOLIN R,HUMULIN R) 100 units/mL injection Inject 0.02-0.04 mLs (2-4 Units total) into the skin 3 (three) times daily with meals. Patient not taking: Reported on 10/13/2017 08/19/17   Mikey College, NP  loperamide (IMODIUM A-D) 2 MG tablet Take 2 mg by mouth 3 (three) times daily as needed.     [provider]  midodrine (PROAMATINE) 5 MG tablet Take 5 mg by mouth as needed (when blood pressure is low).    [provider]    Allergies Cinnamon; Cinoxate; Garlic; Onion; Tylenol [acetaminophen]; Ciprofloxacin; and Prednisone  Family History  Problem Relation Age of Onset  . Hypertension Mother   . Heart disease Mother   . Hypertension Father   . Skin cancer Father     Social History Social History  Substance Use Topics  . Smoking status: Light Tobacco Smoker    Packs/day: 0.25    Years: 20.00    Types: Cigarettes    Last attempt to quit: 05/21/2017  . Smokeless tobacco: Never Used  . Alcohol use No    Review of Systems  Constitutional: No fever/chills Eyes: No visual changes. ENT: No sore throat. Cardiovascular: Denies chest pain. Respiratory: Denies shortness of breath. Gastrointestinal: No abdominal pain.  No nausea, no vomiting.  No diarrhea.  No constipation. Genitourinary: Negative for dysuria. Musculoskeletal: Negative for back pain. Skin: Negative for rash. Neurological: Negative for headaches, focal weakness or numbness.   ____________________________________________   PHYSICAL EXAM:  VITAL SIGNS: ED Triage Vitals  Enc Vitals Group     BP 10/13/17 1110 (!) 178/89     Pulse Rate 10/13/17 1110 84     Resp 10/13/17 1107 16     Temp 10/13/17 1110 98.6 F (37 C)     Temp Source 10/13/17 1107 Oral     SpO2 10/13/17 1110 100 %     Weight 10/13/17 1107 205 lb (93 kg)     Height 10/13/17  1107 5\' 7"  (1.702 m)     Head Circumference --      Peak Flow --      Pain Score 10/13/17 1107 10     Pain Loc --      Pain Edu? --      Excl. in [place]? --     Constitutional: Alert and oriented. Well appearing and in no acute distress. Eyes: Conjunctivae are normal.  Head: Atraumatic. Nose: No congestion/rhinnorhea. Mouth/Throat: Mucous membranes are moist.  Neck: No stridor.   Cardiovascular: Normal rate, regular rhythm. Grossly normal  heart sounds.  Good peripheral circulation with palpable thrill to left upper extremity dialysis fistula as well as palpable radial pulse the right wrist. Respiratory: Normal respiratory effort.  No retractions. Lungs CTAB. Gastrointestinal: Soft and nontender. No distention. No CVA tenderness. Musculoskeletal: No lower extremity tenderness nor edema.  No joint effusions. Right upper extremity with sensation is intact to light touch. 5 out of 5 strength. However, there is pain to the right trapezius muscle as well as the right arm with range of motion.  Neurologic:  Normal speech and language. No gross focal neurologic deficits are appreciated. Skin:  Skin is warm, dry and intact. No rash noted. Psychiatric: Mood and affect are normal. Speech and behavior are normal.  ____________________________________________   LABS (all labs ordered are listed, but only abnormal results are displayed)  Labs Reviewed  BASIC METABOLIC PANEL - Abnormal; Notable for the following:       Result Value   Sodium 134 (*)    Chloride 96 (*)    Glucose, Bld 200 (*)    BUN 65 (*)    Creatinine, Ser 6.89 (*)    Calcium 7.4 (*)    GFR calc non Af Amer 6 (*)    GFR calc Af Amer 7 (*)    Anion gap 16 (*)    All other components within normal limits  CBC - Abnormal; Notable for the following:    RBC 3.41 (*)    Hemoglobin 10.9 (*)    HCT 31.9 (*)    RDW 15.3 (*)    All other components within normal limits  HEPATIC FUNCTION PANEL - Abnormal; Notable for the  following:    Total Protein 8.5 (*)    AST 53 (*)    ALT 61 (*)    Bilirubin, Direct <0.1 (*)    All other components within normal limits  TROPONIN I  LIPASE, BLOOD   ____________________________________________  EKG  ED ECG REPORT I, [place],  Youlanda Roys, the attending physician, personally viewed and interpreted this ECG.   Date: 10/13/2017  EKG Time: 1151  Rate: 83  Rhythm: normal sinus rhythm  Axis: Normal  Intervals:none  ST&T Change: No ST segment elevation or depression. No abnormal T-wave inversion.  ____________________________________________  RADIOLOGY   ____________________________________________   PROCEDURES  Procedure(s) performed:   Procedures  Critical Care performed:   ____________________________________________   INITIAL IMPRESSION / ASSESSMENT AND PLAN / ED COURSE  Pertinent labs & imaging results that were available during my care of the patient were reviewed by me and considered in my medical decision making (see chart for details).  DDX: Musculoskeletal pain, radiculopathy, occlusion of artery, muscle strain, ACS  As part of my medical decision making, I reviewed the following data within the [place] Notes from prior ED visits    Clinical Course as of Oct 13 1537  Wed Oct 13, 2017  1530 Care d/w [name] to facilitate make-up HD tomorrow. He  Will contact her HD facility  [PS]    Clinical Course User Index [PS] Carrie Mew, MD   ----------------------------------------- 3:10 PM on 10/13/2017 ----------------------------------------- Patient was slight improvement in right upper extremity pain. Had a period of abdominal pain which resolved spontaneously. We will obtain an MRI of the patient's cervical spine. Given Ativan for possible muscle spasm component.  Holding steroids at this time because the patient reports that she has had both oral and groin lesions following both ophthalmic and oral  steroids.   ____________________________________________  FINAL CLINICAL IMPRESSION(S) / ED DIAGNOSES  right upper extremity pain    NEW MEDICATIONS STARTED DURING THIS VISIT:  New Prescriptions   No medications on file     Note:  This document was prepared using Dragon voice recognition software and may include unintentional dictation errors.     Orbie Pyo, MD 10/13/17 1539

## 2017-10-13 NOTE — Progress Notes (Signed)
Patient scheduled for outpatient dialysis at [facility] center tomorrow at 6 AM

## 2017-10-13 NOTE — ED Notes (Signed)
Pt calling out , crying upon entering room states " can't take the pain" , MD made aware, verbal orders given for chest pain protocols and pain meds, see MAR

## 2017-10-13 NOTE — Discharge Instructions (Signed)
Your MRI showed worsening compression of your nerves in the neck due to spinal degeneration. Follow up with neurosurgery for further evaluation. Continue taking your pain medications to help alleviate these symptoms.  A heating pad may help.

## 2017-10-13 NOTE — ED Triage Notes (Signed)
Pt to ED via EMS from home with c/o RT arm pain x60month, states decreased sensation at times. PT denies CP. PT is dialysis pt, last dialyzed on 10/22, pt mon-wed-fri. Pt A&OX4, VS stable

## 2017-10-15 DIAGNOSIS — D631 Anemia in chronic kidney disease: Secondary | ICD-10-CM | POA: Diagnosis not present

## 2017-10-15 DIAGNOSIS — N2581 Secondary hyperparathyroidism of renal origin: Secondary | ICD-10-CM | POA: Diagnosis not present

## 2017-10-15 DIAGNOSIS — D509 Iron deficiency anemia, unspecified: Secondary | ICD-10-CM | POA: Diagnosis not present

## 2017-10-15 DIAGNOSIS — R110 Nausea: Secondary | ICD-10-CM | POA: Diagnosis not present

## 2017-10-15 DIAGNOSIS — N186 End stage renal disease: Secondary | ICD-10-CM | POA: Diagnosis not present

## 2017-10-15 DIAGNOSIS — Z23 Encounter for immunization: Secondary | ICD-10-CM | POA: Diagnosis not present

## 2017-10-18 DIAGNOSIS — N186 End stage renal disease: Secondary | ICD-10-CM | POA: Diagnosis not present

## 2017-10-18 DIAGNOSIS — Z23 Encounter for immunization: Secondary | ICD-10-CM | POA: Diagnosis not present

## 2017-10-18 DIAGNOSIS — D509 Iron deficiency anemia, unspecified: Secondary | ICD-10-CM | POA: Diagnosis not present

## 2017-10-18 DIAGNOSIS — N2581 Secondary hyperparathyroidism of renal origin: Secondary | ICD-10-CM | POA: Diagnosis not present

## 2017-10-18 DIAGNOSIS — D631 Anemia in chronic kidney disease: Secondary | ICD-10-CM | POA: Diagnosis not present

## 2017-10-18 DIAGNOSIS — R110 Nausea: Secondary | ICD-10-CM | POA: Diagnosis not present

## 2017-10-20 DIAGNOSIS — Z23 Encounter for immunization: Secondary | ICD-10-CM | POA: Diagnosis not present

## 2017-10-20 DIAGNOSIS — D509 Iron deficiency anemia, unspecified: Secondary | ICD-10-CM | POA: Diagnosis not present

## 2017-10-20 DIAGNOSIS — D631 Anemia in chronic kidney disease: Secondary | ICD-10-CM | POA: Diagnosis not present

## 2017-10-20 DIAGNOSIS — N2581 Secondary hyperparathyroidism of renal origin: Secondary | ICD-10-CM | POA: Diagnosis not present

## 2017-10-20 DIAGNOSIS — R110 Nausea: Secondary | ICD-10-CM | POA: Diagnosis not present

## 2017-10-20 DIAGNOSIS — Z992 Dependence on renal dialysis: Secondary | ICD-10-CM | POA: Diagnosis not present

## 2017-10-20 DIAGNOSIS — N186 End stage renal disease: Secondary | ICD-10-CM | POA: Diagnosis not present

## 2017-10-21 ENCOUNTER — Other Ambulatory Visit: Payer: Self-pay

## 2017-10-21 ENCOUNTER — Other Ambulatory Visit
Admission: RE | Admit: 2017-10-21 | Discharge: 2017-10-21 | Disposition: A | Discharge status: Home / Self Care | Payer: Medicare Other | Source: Ambulatory Visit | Attending: Gastroenterology | Admitting: Gastroenterology

## 2017-10-21 DIAGNOSIS — K529 Noninfective gastroenteritis and colitis, unspecified: Secondary | ICD-10-CM | POA: Diagnosis not present

## 2017-10-21 LAB — LACTOFERRIN, FECAL, QUALITATIVE: Lactoferrin, Fecal, Qual: NEGATIVE

## 2017-10-22 LAB — MISC LABCORP TEST (SEND OUT): Labcorp test code: 1982

## 2017-10-23 DIAGNOSIS — R1110 Vomiting, unspecified: Secondary | ICD-10-CM | POA: Diagnosis not present

## 2017-10-23 DIAGNOSIS — N186 End stage renal disease: Secondary | ICD-10-CM | POA: Diagnosis not present

## 2017-10-23 DIAGNOSIS — N2581 Secondary hyperparathyroidism of renal origin: Secondary | ICD-10-CM | POA: Diagnosis not present

## 2017-10-23 DIAGNOSIS — R110 Nausea: Secondary | ICD-10-CM | POA: Diagnosis not present

## 2017-10-23 DIAGNOSIS — Z992 Dependence on renal dialysis: Secondary | ICD-10-CM | POA: Diagnosis not present

## 2017-10-23 DIAGNOSIS — D509 Iron deficiency anemia, unspecified: Secondary | ICD-10-CM | POA: Diagnosis not present

## 2017-10-23 DIAGNOSIS — D631 Anemia in chronic kidney disease: Secondary | ICD-10-CM | POA: Diagnosis not present

## 2017-10-26 ENCOUNTER — Ambulatory Visit: Payer: Self-pay | Admitting: Nurse Practitioner

## 2017-10-27 DIAGNOSIS — Z992 Dependence on renal dialysis: Secondary | ICD-10-CM | POA: Diagnosis not present

## 2017-10-27 DIAGNOSIS — N186 End stage renal disease: Secondary | ICD-10-CM | POA: Diagnosis not present

## 2017-10-27 DIAGNOSIS — R110 Nausea: Secondary | ICD-10-CM | POA: Diagnosis not present

## 2017-10-27 DIAGNOSIS — N2581 Secondary hyperparathyroidism of renal origin: Secondary | ICD-10-CM | POA: Diagnosis not present

## 2017-10-27 DIAGNOSIS — D631 Anemia in chronic kidney disease: Secondary | ICD-10-CM | POA: Diagnosis not present

## 2017-10-27 DIAGNOSIS — D509 Iron deficiency anemia, unspecified: Secondary | ICD-10-CM | POA: Diagnosis not present

## 2017-10-29 DIAGNOSIS — N2581 Secondary hyperparathyroidism of renal origin: Secondary | ICD-10-CM | POA: Diagnosis not present

## 2017-10-29 DIAGNOSIS — N186 End stage renal disease: Secondary | ICD-10-CM | POA: Diagnosis not present

## 2017-10-29 DIAGNOSIS — D631 Anemia in chronic kidney disease: Secondary | ICD-10-CM | POA: Diagnosis not present

## 2017-10-29 DIAGNOSIS — Z992 Dependence on renal dialysis: Secondary | ICD-10-CM | POA: Diagnosis not present

## 2017-10-29 DIAGNOSIS — R110 Nausea: Secondary | ICD-10-CM | POA: Diagnosis not present

## 2017-10-29 DIAGNOSIS — D509 Iron deficiency anemia, unspecified: Secondary | ICD-10-CM | POA: Diagnosis not present

## 2017-11-01 ENCOUNTER — Ambulatory Visit: Payer: Medicare Other | Admitting: Gastroenterology

## 2017-11-01 DIAGNOSIS — R110 Nausea: Secondary | ICD-10-CM | POA: Diagnosis not present

## 2017-11-01 DIAGNOSIS — Z992 Dependence on renal dialysis: Secondary | ICD-10-CM | POA: Diagnosis not present

## 2017-11-01 DIAGNOSIS — N186 End stage renal disease: Secondary | ICD-10-CM | POA: Diagnosis not present

## 2017-11-01 DIAGNOSIS — D509 Iron deficiency anemia, unspecified: Secondary | ICD-10-CM | POA: Diagnosis not present

## 2017-11-01 DIAGNOSIS — D631 Anemia in chronic kidney disease: Secondary | ICD-10-CM | POA: Diagnosis not present

## 2017-11-01 DIAGNOSIS — N2581 Secondary hyperparathyroidism of renal origin: Secondary | ICD-10-CM | POA: Diagnosis not present

## 2017-11-03 DIAGNOSIS — Z992 Dependence on renal dialysis: Secondary | ICD-10-CM | POA: Diagnosis not present

## 2017-11-03 DIAGNOSIS — N2581 Secondary hyperparathyroidism of renal origin: Secondary | ICD-10-CM | POA: Diagnosis not present

## 2017-11-03 DIAGNOSIS — R110 Nausea: Secondary | ICD-10-CM | POA: Diagnosis not present

## 2017-11-03 DIAGNOSIS — D631 Anemia in chronic kidney disease: Secondary | ICD-10-CM | POA: Diagnosis not present

## 2017-11-03 DIAGNOSIS — D509 Iron deficiency anemia, unspecified: Secondary | ICD-10-CM | POA: Diagnosis not present

## 2017-11-03 DIAGNOSIS — N186 End stage renal disease: Secondary | ICD-10-CM | POA: Diagnosis not present

## 2017-11-04 ENCOUNTER — Ambulatory Visit: Payer: Medicare Other | Admitting: Student in an Organized Health Care Education/Training Program

## 2017-11-05 DIAGNOSIS — R110 Nausea: Secondary | ICD-10-CM | POA: Diagnosis not present

## 2017-11-05 DIAGNOSIS — Z992 Dependence on renal dialysis: Secondary | ICD-10-CM | POA: Diagnosis not present

## 2017-11-05 DIAGNOSIS — D631 Anemia in chronic kidney disease: Secondary | ICD-10-CM | POA: Diagnosis not present

## 2017-11-05 DIAGNOSIS — N2581 Secondary hyperparathyroidism of renal origin: Secondary | ICD-10-CM | POA: Diagnosis not present

## 2017-11-05 DIAGNOSIS — D509 Iron deficiency anemia, unspecified: Secondary | ICD-10-CM | POA: Diagnosis not present

## 2017-11-05 DIAGNOSIS — N186 End stage renal disease: Secondary | ICD-10-CM | POA: Diagnosis not present

## 2017-11-08 DIAGNOSIS — N2581 Secondary hyperparathyroidism of renal origin: Secondary | ICD-10-CM | POA: Diagnosis not present

## 2017-11-08 DIAGNOSIS — D509 Iron deficiency anemia, unspecified: Secondary | ICD-10-CM | POA: Diagnosis not present

## 2017-11-08 DIAGNOSIS — N186 End stage renal disease: Secondary | ICD-10-CM | POA: Diagnosis not present

## 2017-11-08 DIAGNOSIS — R110 Nausea: Secondary | ICD-10-CM | POA: Diagnosis not present

## 2017-11-08 DIAGNOSIS — D631 Anemia in chronic kidney disease: Secondary | ICD-10-CM | POA: Diagnosis not present

## 2017-11-08 DIAGNOSIS — Z992 Dependence on renal dialysis: Secondary | ICD-10-CM | POA: Diagnosis not present

## 2017-11-09 ENCOUNTER — Ambulatory Visit (INDEPENDENT_AMBULATORY_CARE_PROVIDER_SITE_OTHER): Payer: Medicare Other | Admitting: Gastroenterology

## 2017-11-09 ENCOUNTER — Encounter: Payer: Self-pay | Admitting: Gastroenterology

## 2017-11-09 ENCOUNTER — Other Ambulatory Visit: Payer: Self-pay

## 2017-11-09 ENCOUNTER — Telehealth: Payer: Self-pay

## 2017-11-09 ENCOUNTER — Ambulatory Visit: Payer: Medicare Other | Admitting: Student in an Organized Health Care Education/Training Program

## 2017-11-09 VITALS — BP 145/86 | HR 81 | Temp 98.3°F | Ht 67.0 in | Wt 206.0 lb

## 2017-11-09 DIAGNOSIS — B182 Chronic viral hepatitis C: Secondary | ICD-10-CM | POA: Diagnosis not present

## 2017-11-09 DIAGNOSIS — K7469 Other cirrhosis of liver: Secondary | ICD-10-CM | POA: Diagnosis not present

## 2017-11-09 NOTE — Progress Notes (Signed)
Cephas Darby, MD [address]  [place]  [place], [place] [address]  Main: [phone]  Fax: [phone]    Gastroenterology Consultation  Referring Provider:     Mikey College, * Primary Care Physician:  Mikey College, NP Primary Gastroenterologist:  Dr. Cephas Darby Reason for Consultation:     Chronic hepatitis C, chronic liver disease, chronic diarrhea        HPI:   [name] is a 51 y.o. y/o female referred by Dr. Merrilyn Puma, Jerrel Ivory, NP  for consultation & management of Chronic diarrhea, chronic hepatitis C and cirrhosis  She has multiple comorbidities as listed below. She was initially seen by Dr. Vicente Males in 11/2016 in our clinic for management of chronic hepatitis C and chronic diarrhea. At the time he recommended upper endoscopy, colonoscopy and workup for her liver disease. She reported that she moved to [facility] and was lost to follow-up. Prior to this she was also seen in [facility] clinic in 04/2016 for chronic diarrhea, hepatitis C and dysphagia.  She was lost to follow-up at that time as well. Currently, she lives in [place], [place].   She has been having chronic diarrhea for more than a year intermittently. Most recent episode started about 10 days ago, after she received IV iron and Epogen at the time of dialysis. She reports having about 4-5 episodes of runny, nonbloody bowel movements. She describes that her stool was black when the diarrhea episode started, currently her stools are light yellow in color. She reports upper abdominal pain and associated with significant postprandial bloating. She denies urgency or incontinence, fever or chills, nausea or vomiting. She denies any weight loss, concern about weight gain especially in her waist. She denies swelling of legs. He denies hematemesis or coffee-ground emesis. She does have chronic normocytic anemia, and receives Epogen with her dialysis.  Hepatitis C Diagnosed in 2003- last used cocaine 6  years back , has had professional tatoos, has been incarcerated, no Armed forces logistics/support/administrative officer. Does not drink alcohol presently, no excess use in the past . No family history of liver disease. Genotype 1a,  high viral load, treatment nave, chronic liver disease manifested as splenomegaly, mild thrombocytopenia, hypoalbuminemia, fibrosis score 0.66, stage F3 in 07/2014. She is immune to hepatitis A and B. And, HIV nonreactive   Ferritin 45 in 02/2013 Her hemoglobin A1c is 8.3 in 06/2017 H Pylori stool antigen negative in 11/2016 Has been on oxycodone for chronic back pain   Follow up visit 10/01/2017 She is here for a follow-up earlier than she is scheduled with me. She did not get the labs that I ordered from last visit. She tried to go to the lab on 09/28/2017 but she had an accident in her pants secondary to fecal incontinence and had to leave right to the. She continues to have intermittent nonbloody diarrhea associated with urgency and incontinence. She took Imodium 2 pills 2 days ago. Did not have bowel movement yet. Her colon biopsies revealed focal active colitis with no background of chronicity. She cut back on smoking to 2 cigarettes a day  Follow-up visit 11/09/17: Overall, she feels good.  She denies having diarrhea anymore.  She is no longer on Imodium.  Currently, she is constipated since starting phosphate binders that contains calcium in it.  She is waiting to start hepatitis C treatment.  She got significantly cut back on smoking cigarettes.  She smoked only 4 cigarettes last week.     GI Procedures:  EGD 09/22/2017 - Normal duodenal bulb and second portion of the duodenum. Biopsied. - Non-bleeding erosive gastropathy. Biopsied. - 1 cm hiatal hernia. - Z-line irregular. Biopsied. - Normal esophagus. Colonoscopy 09/22/2017    Past Medical History:  Diagnosis Date  . Allergy   . Anemia   . Anxiety    worse when not at home - bowel incontinence  . Arthritis   . Ataxia   . CHF  (congestive heart failure) (HCC)    history of, has been cleared.  . Chronic kidney disease   . COPD (chronic obstructive pulmonary disease) ([place])   . Depression   . Diabetes mellitus without complication ([place])   . Emphysema of lung ([place])   . End stage renal disease ([place])    MWF dialysis  . Fistula    left upper arm  . Headache    migraines  . Hepatitis 2003   Hep C  . Hiatal hernia   . Hypercholesterolemia    pt denies  . Hypertension   . Kidney disease   . Neuropathy, diabetic (HCC)    lower legs  . Peripheral vascular disease ([place])   . Pneumonia 2015  . Psoriasis   . Tobacco dependence   . Wears dentures    full upper, partial lower    Past Surgical History:  Procedure Laterality Date  . A/V Shunt Intervention N/A 10/07/2015   Performed by Algernon Huxley, MD at [facility]  . A/V Shunt Intervention Left 07/25/2015   Performed by Algernon Huxley, MD at [facility]  . A/V Shuntogram/Fistulagram Left 10/07/2015   Performed by Algernon Huxley, MD at [facility]  . A/V Shuntogram/Fistulagram N/A 07/25/2015   Performed by Algernon Huxley, MD at [facility]  . AV FISTULA PLACEMENT Left 11/2014  . CESAREAN SECTION    . CHOLECYSTECTOMY    . COLONOSCOPY N/A 09/22/2017   Performed by Lin Landsman, MD at [facility]  . cyst removed  from left hand Left 1989  . Dialysis/Perma Catheter Insertion  10/07/2015   Performed by Algernon Huxley, MD at [facility]  . Dialysis/Perma Catheter Insertion and fistulagram/LUE angiogram N/A 05/20/2017   Performed by Algernon Huxley, MD at [facility]  . Dialysis/Perma Catheter Removal N/A 12/17/2015   Performed by Katha Cabal, MD at [facility]  . ESOPHAGOGASTRODUODENOSCOPY (EGD) N/A 09/22/2017   Performed by Lin Landsman, MD at [facility]  . INCISION AND DRAINAGE ABSCESS N/A 07/16/2016   Performed by Carloyn Manner, MD at [facility]  . POLYPECTOMY  09/22/2017     Performed by Lin Landsman, MD at [facility]  . rt. tubal and ovary removed    . TONSILLECTOMY    . TUBAL LIGATION    . Visceral Angiography N/A 01/11/2017   Performed by Algernon Huxley, MD at [facility]  . Visceral Artery Intervention N/A 01/11/2017   Performed by Algernon Huxley, MD at [facility]     Current Outpatient Medications:  .  b complex-vitamin c-folic acid (NEPHRO-VITE) 0.8 MG TABS tablet, Take 1 tablet by mouth daily. , Disp: , Rfl:  .  calcium acetate (PHOSLO) 667 MG capsule, Take 1,334 mg by mouth 3 (three) times daily with meals. , Disp: , Rfl:  .  clobetasol ointment (TEMOVATE) 4.76 %, Apply 1 application topically daily as needed (for psorasis). ,  Disp: , Rfl:  .  diltiazem (TIAZAC) 120 MG 24 hr capsule, Take 240 mg by mouth 2 (two) times daily. , Disp: , Rfl:  .  escitalopram (LEXAPRO) 10 MG tablet, Take by mouth., Disp: , Rfl:  .  gabapentin (NEURONTIN) 100 MG capsule, Take 1 capsule (100 mg total) by mouth 3 (three) times daily. OR 3 capsules at bedtime., Disp: 90 capsule, Rfl: 3 .  glucose 4 GM chewable tablet, Chew 1 tablet (4 g total) by mouth as needed for low blood sugar., Disp: 50 tablet, Rfl: 12 .  hydrALAZINE (APRESOLINE) 100 MG tablet, Take 100 mg by mouth., Disp: , Rfl:  .  insulin glargine (LANTUS) 100 UNIT/ML injection, Inject 15 Units into the skin daily. , Disp: , Rfl:  .  insulin regular (HUMULIN R) 100 units/mL injection, Inject as directed Three (3) times a day with a meal., Disp: , Rfl:  .  insulin regular (NOVOLIN R,HUMULIN R) 100 units/mL injection, Inject 0.02-0.04 mLs (2-4 Units total) into the skin 3 (three) times daily with meals., Disp: 10 mL, Rfl: 2 .  loperamide (IMODIUM A-D) 2 MG tablet, Take 2 mg by mouth 3 (three) times daily as needed. , Disp: , Rfl:  .  midodrine (PROAMATINE) 5 MG tablet, Take 5 mg by mouth as needed (when blood pressure is low)., Disp: , Rfl:  .  moxifloxacin (VIGAMOX) 0.5 % ophthalmic  solution, Administer 1 drop to the right eye Four (4) times a day., Disp: , Rfl:  .  oxyCODONE (OXY IR/ROXICODONE) 5 MG immediate release tablet, Take 1 tablet (5 mg total) by mouth 3 (three) times daily as needed for severe pain. For chronic pain To last 30 days from fill date, Disp: 90 tablet, Rfl: 0 .  promethazine (PHENERGAN) 25 MG tablet, Take 1 tablet (25 mg total) by mouth every 4 (four) hours as needed for nausea or vomiting., Disp: 30 tablet, Rfl: 1 .  sevelamer carbonate (RENVELA) 800 MG tablet, Take 1,600 mg by mouth 3 (three) times daily with meals., Disp: , Rfl:    Family History  Problem Relation Age of Onset  . Hypertension Mother   . Heart disease Mother   . Hypertension Father   . Skin cancer Father      Social History   Tobacco Use  . Smoking status: Light Tobacco Smoker    Packs/day: 0.25    Years: 20.00    Pack years: 5.00    Types: Cigarettes    Last attempt to quit: 05/21/2017    Years since quitting: 0.4  . Smokeless tobacco: Never Used  Substance Use Topics  . Alcohol use: No  . Drug use: No    Allergies as of 11/09/2017 - Review Complete 11/09/2017  Allergen Reaction Noted  . Cinnamon Anaphylaxis 10/11/2015  . Cinoxate Anaphylaxis 10/11/2015  . Garlic Anaphylaxis and Hives 11/28/2014  . Onion Hives and Swelling 11/28/2014  . Tylenol [acetaminophen] Anaphylaxis 07/25/2015  . Ciprofloxacin Diarrhea 11/05/2016  . Prednisone Other (See Comments) 01/05/2017    Review of Systems:    All systems reviewed and negative except where noted in HPI.   Physical Exam:  BP (!) 145/86   Pulse 81   Temp 98.3 F (36.8 C) (Oral)   Ht '5\' 7"'$  (1.702 m)   Wt 206 lb (93.4 kg)   BMI 32.26 kg/m  No LMP recorded. Patient is postmenopausal.  General:   Alert,  Well-developed, well-nourished, pleasant and cooperative in NAD Head:  Normocephalic and atraumatic. Eyes:  Sclera clear, no icterus.   Conjunctiva pink. Ears:  Normal auditory acuity. Nose:  No deformity,  discharge, or lesions. Mouth:  No deformity or lesions,oropharynx pink & moist. Neck:  Supple; no masses or thyromegaly. Lungs:  Respirations even and unlabored.  Clear throughout to auscultation.   No wheezes, crackles, or rhonchi. No acute distress. Heart:  Regular rate and rhythm; no murmurs, clicks, rubs, or gallops. Abdomen:  Normal bowel sounds.  Abdominal obesity, significantly redundant skin, abdominal striae present, no appreciable ascites on exam.  Soft, non-tender and non-distended without masses, hepatosplenomegaly or hernias noted.  No guarding or rebound tenderness.   Rectal: Nor performed Msk:  Symmetrical without gross deformities. Good, equal movement & strength bilaterally. Pulses:  Normal pulses noted. Extremities:  No clubbing or edema.  No cyanosis, left upper arm AV fistula, bruit palpable. Neurologic:  Alert and oriented x3;  grossly normal neurologically. Skin:  Psoriatic rash on her right elbow, fingers, No jaundice. Lymph Nodes:  No significant cervical adenopathy. Psych:  Alert and cooperative. Normal mood and affect.  Imaging Studies:  Reviewed  Assessment and Plan:   CARALINE DEUTSCHMAN is a 51 y.o. White  female with  ESRD on hemodialysis, diabetes on insulin, chronic hepatitis C, genotype 1a, treatment nave, early cirrhosis, well compensated, and chronic intermittent diarrhea with bloating and epigastric pain.  Chronic diarrhea associated with bloating and abdominal pain: Symptoms resolved after starting phosphate binders as the contain calcium. Colonic mucosa appeared normal but random colon biopsies revealed mild focal active colitis on colonoscopy in 09/2017. Differentials include early IBD or infectious etiology or nonspecific or secondary to bowel prep - Her ESR was significantly elevated - Fecal lactoferrin normal, she did not get the rest of the stool studies that I ordered as she is constipated - Monitor for now as she is asymptomatic  Constipation: -  Advised her to avoid constipation, add high-fiber in her diet, fiber supplement such as psyllium, and stool softeners like Colace or MiraLAX to have volume movements 1-2 daily in the setting of cirrhosis  Advanced fibrosis/early cirrhosis: Secondary to chronic hepatitis C Fibrosure from 2015 suggests F3, she does have mild splenomegaly, thrombocytopenia, hypoalbuminemia, hyperbilirubinemia CPT score 6, class A Completed secondary liver disease workup and negative for ANA, Antimitochondrial antibodies, anti-smooth muscle antibodies. Ferritin normal. She is immune to hepatitis A and B. Alpha-1 antitrypsin levels are normal - EGD for variceal screening, up to date 09/22/2017. Repeat EGD in 2020 - No evidence of volume overload, recommend 2 g sodium diet - No evidence of encephalopathy - She does have mild normocytic anemia and thrombocytopenia. No coagulopathy. Ferritin normal - HCC screening - alpha-fetoprotein normal, ultrasound liver 09/28/2017 up-to-date, no liver lesions  Chronic hepatitis C, genotype 1a, viral load 255,801 units in 08/11/2016: Treatment nave, negative cryoglobulin levels - She is immune to hepatitis A and B. Her hep B surface antigen nonreactive, hep B surface antibody reactive, hep B core antibody nonreactive, hepatitis A IgG reactive in 08/11/2016. Hepatitis E antigen and antibody negative - Recommend MAVYRET for 12weeks with history of end-stage renal disease  Large hemorrhoids -currently no symptoms Hemorrhoid banding in future if needed  Health maintenance: Immune to hepatitis A and B vaccine Received Pneumovax on 09/2012 Recommend annual influenza vaccine Recommend zoster vaccine Colon cancer screening: Colonoscopy on 09/22/2017 revealed tubular adenomas, size greater than 10 mm. Repeat colonoscopy in 09/2020 Check Vit D levels in next visit   Follow up in 45month  RCephas Darby MD

## 2017-11-09 NOTE — Telephone Encounter (Signed)
Pts referral for Hep C med has been sent to [facility] for authorization review process.  Thanks [employer]

## 2017-11-10 DIAGNOSIS — D631 Anemia in chronic kidney disease: Secondary | ICD-10-CM | POA: Diagnosis not present

## 2017-11-10 DIAGNOSIS — Z992 Dependence on renal dialysis: Secondary | ICD-10-CM | POA: Diagnosis not present

## 2017-11-10 DIAGNOSIS — D509 Iron deficiency anemia, unspecified: Secondary | ICD-10-CM | POA: Diagnosis not present

## 2017-11-10 DIAGNOSIS — R110 Nausea: Secondary | ICD-10-CM | POA: Diagnosis not present

## 2017-11-10 DIAGNOSIS — N2581 Secondary hyperparathyroidism of renal origin: Secondary | ICD-10-CM | POA: Diagnosis not present

## 2017-11-10 DIAGNOSIS — N186 End stage renal disease: Secondary | ICD-10-CM | POA: Diagnosis not present

## 2017-11-12 DIAGNOSIS — N186 End stage renal disease: Secondary | ICD-10-CM | POA: Diagnosis not present

## 2017-11-12 DIAGNOSIS — D509 Iron deficiency anemia, unspecified: Secondary | ICD-10-CM | POA: Diagnosis not present

## 2017-11-12 DIAGNOSIS — Z992 Dependence on renal dialysis: Secondary | ICD-10-CM | POA: Diagnosis not present

## 2017-11-12 DIAGNOSIS — N2581 Secondary hyperparathyroidism of renal origin: Secondary | ICD-10-CM | POA: Diagnosis not present

## 2017-11-12 DIAGNOSIS — R110 Nausea: Secondary | ICD-10-CM | POA: Diagnosis not present

## 2017-11-12 DIAGNOSIS — D631 Anemia in chronic kidney disease: Secondary | ICD-10-CM | POA: Diagnosis not present

## 2017-11-15 ENCOUNTER — Ambulatory Visit: Payer: Medicare Other | Admitting: Student in an Organized Health Care Education/Training Program

## 2017-11-15 DIAGNOSIS — Z992 Dependence on renal dialysis: Secondary | ICD-10-CM | POA: Diagnosis not present

## 2017-11-15 DIAGNOSIS — R110 Nausea: Secondary | ICD-10-CM | POA: Diagnosis not present

## 2017-11-15 DIAGNOSIS — N2581 Secondary hyperparathyroidism of renal origin: Secondary | ICD-10-CM | POA: Diagnosis not present

## 2017-11-15 DIAGNOSIS — D631 Anemia in chronic kidney disease: Secondary | ICD-10-CM | POA: Diagnosis not present

## 2017-11-15 DIAGNOSIS — N186 End stage renal disease: Secondary | ICD-10-CM | POA: Diagnosis not present

## 2017-11-15 DIAGNOSIS — D509 Iron deficiency anemia, unspecified: Secondary | ICD-10-CM | POA: Diagnosis not present

## 2017-11-16 DIAGNOSIS — Z992 Dependence on renal dialysis: Secondary | ICD-10-CM | POA: Diagnosis not present

## 2017-11-16 DIAGNOSIS — R110 Nausea: Secondary | ICD-10-CM | POA: Diagnosis not present

## 2017-11-16 DIAGNOSIS — N2581 Secondary hyperparathyroidism of renal origin: Secondary | ICD-10-CM | POA: Diagnosis not present

## 2017-11-16 DIAGNOSIS — N186 End stage renal disease: Secondary | ICD-10-CM | POA: Diagnosis not present

## 2017-11-16 DIAGNOSIS — D631 Anemia in chronic kidney disease: Secondary | ICD-10-CM | POA: Diagnosis not present

## 2017-11-16 DIAGNOSIS — D509 Iron deficiency anemia, unspecified: Secondary | ICD-10-CM | POA: Diagnosis not present

## 2017-11-17 ENCOUNTER — Encounter: Payer: Self-pay | Admitting: Student in an Organized Health Care Education/Training Program

## 2017-11-17 ENCOUNTER — Ambulatory Visit
Payer: Medicare Other | Attending: Student in an Organized Health Care Education/Training Program | Admitting: Student in an Organized Health Care Education/Training Program

## 2017-11-17 ENCOUNTER — Other Ambulatory Visit: Payer: Self-pay

## 2017-11-17 VITALS — BP 170/81 | HR 77 | Temp 98.4°F | Resp 18 | Ht 67.0 in | Wt 203.0 lb

## 2017-11-17 DIAGNOSIS — R079 Chest pain, unspecified: Secondary | ICD-10-CM | POA: Insufficient documentation

## 2017-11-17 DIAGNOSIS — F341 Dysthymic disorder: Secondary | ICD-10-CM | POA: Diagnosis not present

## 2017-11-17 DIAGNOSIS — M542 Cervicalgia: Secondary | ICD-10-CM

## 2017-11-17 DIAGNOSIS — Z881 Allergy status to other antibiotic agents status: Secondary | ICD-10-CM | POA: Insufficient documentation

## 2017-11-17 DIAGNOSIS — E1151 Type 2 diabetes mellitus with diabetic peripheral angiopathy without gangrene: Secondary | ICD-10-CM | POA: Insufficient documentation

## 2017-11-17 DIAGNOSIS — M47816 Spondylosis without myelopathy or radiculopathy, lumbar region: Secondary | ICD-10-CM | POA: Diagnosis not present

## 2017-11-17 DIAGNOSIS — N186 End stage renal disease: Secondary | ICD-10-CM | POA: Diagnosis not present

## 2017-11-17 DIAGNOSIS — M5127 Other intervertebral disc displacement, lumbosacral region: Secondary | ICD-10-CM | POA: Insufficient documentation

## 2017-11-17 DIAGNOSIS — M5412 Radiculopathy, cervical region: Secondary | ICD-10-CM

## 2017-11-17 DIAGNOSIS — K589 Irritable bowel syndrome without diarrhea: Secondary | ICD-10-CM | POA: Diagnosis not present

## 2017-11-17 DIAGNOSIS — M4802 Spinal stenosis, cervical region: Secondary | ICD-10-CM | POA: Diagnosis not present

## 2017-11-17 DIAGNOSIS — M4722 Other spondylosis with radiculopathy, cervical region: Secondary | ICD-10-CM | POA: Diagnosis not present

## 2017-11-17 DIAGNOSIS — F419 Anxiety disorder, unspecified: Secondary | ICD-10-CM | POA: Diagnosis not present

## 2017-11-17 DIAGNOSIS — E1142 Type 2 diabetes mellitus with diabetic polyneuropathy: Secondary | ICD-10-CM | POA: Diagnosis not present

## 2017-11-17 DIAGNOSIS — I132 Hypertensive heart and chronic kidney disease with heart failure and with stage 5 chronic kidney disease, or end stage renal disease: Secondary | ICD-10-CM | POA: Insufficient documentation

## 2017-11-17 DIAGNOSIS — E11621 Type 2 diabetes mellitus with foot ulcer: Secondary | ICD-10-CM | POA: Diagnosis not present

## 2017-11-17 DIAGNOSIS — R159 Full incontinence of feces: Secondary | ICD-10-CM | POA: Insufficient documentation

## 2017-11-17 DIAGNOSIS — L97529 Non-pressure chronic ulcer of other part of left foot with unspecified severity: Secondary | ICD-10-CM | POA: Insufficient documentation

## 2017-11-17 DIAGNOSIS — K449 Diaphragmatic hernia without obstruction or gangrene: Secondary | ICD-10-CM | POA: Insufficient documentation

## 2017-11-17 DIAGNOSIS — Z888 Allergy status to other drugs, medicaments and biological substances status: Secondary | ICD-10-CM | POA: Insufficient documentation

## 2017-11-17 DIAGNOSIS — M5126 Other intervertebral disc displacement, lumbar region: Secondary | ICD-10-CM | POA: Insufficient documentation

## 2017-11-17 DIAGNOSIS — Z940 Kidney transplant status: Secondary | ICD-10-CM | POA: Insufficient documentation

## 2017-11-17 DIAGNOSIS — E11311 Type 2 diabetes mellitus with unspecified diabetic retinopathy with macular edema: Secondary | ICD-10-CM | POA: Insufficient documentation

## 2017-11-17 DIAGNOSIS — Z79899 Other long term (current) drug therapy: Secondary | ICD-10-CM | POA: Insufficient documentation

## 2017-11-17 DIAGNOSIS — Z992 Dependence on renal dialysis: Secondary | ICD-10-CM | POA: Diagnosis not present

## 2017-11-17 DIAGNOSIS — L4050 Arthropathic psoriasis, unspecified: Secondary | ICD-10-CM | POA: Insufficient documentation

## 2017-11-17 DIAGNOSIS — Z9049 Acquired absence of other specified parts of digestive tract: Secondary | ICD-10-CM | POA: Insufficient documentation

## 2017-11-17 DIAGNOSIS — G43909 Migraine, unspecified, not intractable, without status migrainosus: Secondary | ICD-10-CM | POA: Diagnosis not present

## 2017-11-17 DIAGNOSIS — E875 Hyperkalemia: Secondary | ICD-10-CM | POA: Insufficient documentation

## 2017-11-17 DIAGNOSIS — M5030 Other cervical disc degeneration, unspecified cervical region: Secondary | ICD-10-CM

## 2017-11-17 DIAGNOSIS — M47812 Spondylosis without myelopathy or radiculopathy, cervical region: Secondary | ICD-10-CM

## 2017-11-17 DIAGNOSIS — E1122 Type 2 diabetes mellitus with diabetic chronic kidney disease: Secondary | ICD-10-CM | POA: Diagnosis not present

## 2017-11-17 DIAGNOSIS — M5010 Cervical disc disorder with radiculopathy, unspecified cervical region: Secondary | ICD-10-CM | POA: Diagnosis not present

## 2017-11-17 DIAGNOSIS — Z91018 Allergy to other foods: Secondary | ICD-10-CM | POA: Insufficient documentation

## 2017-11-17 DIAGNOSIS — G9520 Unspecified cord compression: Secondary | ICD-10-CM | POA: Diagnosis not present

## 2017-11-17 DIAGNOSIS — Z794 Long term (current) use of insulin: Secondary | ICD-10-CM | POA: Insufficient documentation

## 2017-11-17 DIAGNOSIS — Z9889 Other specified postprocedural states: Secondary | ICD-10-CM | POA: Insufficient documentation

## 2017-11-17 DIAGNOSIS — B1920 Unspecified viral hepatitis C without hepatic coma: Secondary | ICD-10-CM | POA: Insufficient documentation

## 2017-11-17 DIAGNOSIS — Z808 Family history of malignant neoplasm of other organs or systems: Secondary | ICD-10-CM | POA: Insufficient documentation

## 2017-11-17 DIAGNOSIS — F4325 Adjustment disorder with mixed disturbance of emotions and conduct: Secondary | ICD-10-CM | POA: Insufficient documentation

## 2017-11-17 DIAGNOSIS — Z886 Allergy status to analgesic agent status: Secondary | ICD-10-CM | POA: Insufficient documentation

## 2017-11-17 DIAGNOSIS — Z8249 Family history of ischemic heart disease and other diseases of the circulatory system: Secondary | ICD-10-CM | POA: Insufficient documentation

## 2017-11-17 DIAGNOSIS — F17210 Nicotine dependence, cigarettes, uncomplicated: Secondary | ICD-10-CM | POA: Insufficient documentation

## 2017-11-17 MED ORDER — OXYCODONE HCL 5 MG PO TABS: 5.0000 mg | ORAL_TABLET | Freq: Three times a day (TID) | ORAL | 0 refills | Status: DC | PRN

## 2017-11-17 NOTE — Progress Notes (Signed)
Patient's Name: [name]  MRN: [id number]  Referring Provider: Mikey College, *  DOB: 17-Nov-1966  PCP: Mikey College, NP  DOS: 11/17/2017  Note by: Gillis Santa, MD  Service setting: Ambulatory outpatient  Specialty: Interventional Pain Management  Location: [facility] (AMB) Pain Management Facility    Patient type: Established   Primary Reason(s) for Visit: Encounter for prescription drug management. (Level of risk: moderate)  CC: Neck Pain and Back Pain (lower)  HPI  [name] is a 51 y.o. year old, female patient, who comes today for a medication management evaluation. She has Adjustment disorder with mixed disturbance of emotions and conduct; Dysthymia; History of MRSA infection; Chronic ulcer of left foot ([place]); Acquired palmar and plantar hyperkeratosis; Essential hypertension; Tobacco abuse; ESRD (end stage renal disease) on dialysis ([place]); Diabetic macular edema ([place]); Hypertensive retinopathy of both eyes; Kidney transplant candidate; Non-proliferative diabetic retinopathy, severe, both eyes ([place]); Psoriatic arthropathy ([place]); Diabetes mellitus type 2, uncontrolled, with complications ([place]); SMA stenosis ([place]); Generalized abdominal pain; Hyperkalemia; Dialysis AV fistula malfunction ([place]); Difficulty walking; Neuropathy due to type 2 diabetes mellitus ([place]); Neck pain; Anxiety; Irritable bowel syndrome (IBS); Chronic bilateral low back pain; Stool incontinence; Abdominal pain, chronic, right lower quadrant; Diarrhea; and Incontinence of feces on their problem list. Her primarily concern today is the Neck Pain and Back Pain (lower)  Pain Assessment: Location:   Neck Radiating: entire right arm Onset: More than a month ago Duration: Chronic pain Quality: Aching, Heaviness, Pressure, Tingling Severity: 10-Worst pain ever/10 (self-reported pain score)  Note: Reported level is inconsistent with clinical observations. Clinically the patient looks like a 3/10 A 3/10 is viewed as  "Moderate" and described as significantly interfering with activities of daily living (ADL). It becomes difficult to feed, bathe, get dressed, get on and off the toilet or to perform personal hygiene functions. Difficult to get in and out of bed or a chair without assistance. Very distracting. With effort, it can be ignored when deeply involved in activities.       When using our objective Pain Scale, levels between 6 and 10/10 are said to belong in an emergency room, as it progressively worsens from a 6/10, described as severely limiting, requiring emergency care not usually available at an outpatient pain management facility. At a 6/10 level, communication becomes difficult and requires great effort. Assistance to reach the emergency department may be required. Facial flushing and profuse sweating along with potentially dangerous increases in heart rate and blood pressure will be evident. Effect on ADL:   Timing: Constant Modifying factors: medications  [name] was last scheduled for an appointment on 11/15/2017 for medication management. During today's appointment we reviewed [name]'s chronic pain status, as well as her outpatient medication regimen.  Since the patient's last visit with me, patient had a visit to the emergency department for worsening right upper extremity pain.  MRI obtained in ED showed interval progression of cervical spondylosis and central canal stenosis most pronounced at C5-C6 that has worsened from her previous cervical MRI done in December 2017.  The patient  reports that she does not use drugs. Her body mass index is 31.79 kg/m.  Further details on both, my assessment(s), as well as the proposed treatment plan, please see below.  Controlled Substance Pharmacotherapy Assessment REMS (Risk Evaluation and Mitigation Strategy)  Analgesic: Oxycodone 5 mg 3 times daily as needed, quantity 25-monthMME/day: Approximately 20-25 mg/day.  WLandis Martins RN  11/17/2017   2:02 PM  Sign  at close encounter Nursing Pain Medication Assessment:  Safety precautions to be maintained throughout the outpatient stay will include: orient to surroundings, keep bed in low position, maintain call bell within reach at all times, provide assistance with transfer out of bed and ambulation.  Medication Inspection Compliance: Pill count conducted under aseptic conditions, in front of the patient. Neither the pills nor the bottle was removed from the patient's sight at any time. Once count was completed pills were immediately returned to the patient in their original bottle.  Medication: Oxycodone IR Pill/Patch Count: 0 of 90 pills remain Pill/Patch Appearance: Markings consistent with prescribed medication Bottle Appearance: Standard pharmacy container. Clearly labeled. Filled Date:10/16/ 2018 Last Medication intake:  Today   Pharmacokinetics: Liberation and absorption (onset of action): WNL Distribution (time to peak effect): WNL Metabolism and excretion (duration of action): WNL         Pharmacodynamics: Desired effects: Analgesia: [name] reports >50% benefit. Functional ability: Patient reports that medication allows her to accomplish basic ADLs Clinically meaningful improvement in function (CMIF): Sustained CMIF goals met Perceived effectiveness: Described as relatively effective, allowing for increase in activities of daily living (ADL) Undesirable effects: Side-effects or Adverse reactions: None reported Monitoring: [place] PMP: Online review of the past 33-monthperiod conducted. Compliant with practice rules and regulations Last UDS on record: No results found for: SUMMARY UDS interpretation: Compliant          Medication Assessment Form: Reviewed. Patient indicates being compliant with therapy Treatment compliance: Compliant Risk Assessment Profile: Aberrant behavior: See prior evaluations. None observed or detected today Comorbid factors increasing risk of  overdose: See prior notes. No additional risks detected today Risk of substance use disorder (SUD): Low  ORT Scoring interpretation table:  Score <3 = Low Risk for SUD  Score between 4-7 = Moderate Risk for SUD  Score >8 = High Risk for Opioid Abuse   Risk Mitigation Strategies:  Patient Counseling: Covered Patient-Prescriber Agreement (PPA): Present and active  Notification to other healthcare providers: Done  Pharmacologic Plan: No change in therapy, at this time  Laboratory Chemistry  Inflammation Markers (CRP: Acute Phase) (ESR: Chronic Phase) Lab Results  Component Value Date   CRP <0.8 12/16/2016   ESRSEDRATE 117 (H) 10/12/2017                 Rheumatology Markers Lab Results  Component Value Date   ANA Negative 10/12/2017                Renal Function Markers Lab Results  Component Value Date   BUN 65 (H) 10/13/2017   CREATININE 6.89 (H) 10/13/2017   GFRAA 7 (L) 10/13/2017   GFRNONAA 6 (L) 10/13/2017                 Hepatic Function Markers Lab Results  Component Value Date   AST 53 (H) 10/13/2017   ALT 61 (H) 10/13/2017   ALBUMIN 3.7 10/13/2017   ALKPHOS 49 10/13/2017   HCVAB >11.0 (H) 12/16/2016                 Electrolytes Lab Results  Component Value Date   NA 134 (L) 10/13/2017   K 4.9 10/13/2017   CL 96 (L) 10/13/2017   CALCIUM 7.4 (L) 10/13/2017   MG 1.7 (L) 12/29/2012                 Neuropathy Markers Lab Results  Component Value Date   HGBA1C 8.3 07/12/2017  Bone Pathology Markers No results found for: Marveen Reeks, [id number], [id number], 25OHVITD1, 25OHVITD2, 25OHVITD3, TESTOFREE, TESTOSTERONE               Coagulation Parameters Lab Results  Component Value Date   INR 1.01 10/12/2017   LABPROT 13.2 10/12/2017   APTT 56 (H) 05/19/2017   PLT 161 10/13/2017                 Cardiovascular Markers Lab Results  Component Value Date   BNP 2,289 (H) 04/13/2013   CKTOTAL 425 (H) 10/19/2014   CKMB 0.6  04/14/2013   TROPONINI <0.03 10/13/2017   HGB 10.9 (L) 10/13/2017   HCT 31.9 (L) 10/13/2017                 CA Markers No results found for: CEA, CA125, LABCA2               Note: Lab results reviewed.  Recent Diagnostic Imaging Results  MR CERVICAL SPINE WO CONTRAST CLINICAL DATA:  Right arm pain for 1 month. Occasional reduced sensation.  EXAM: MRI CERVICAL SPINE WITHOUT CONTRAST  TECHNIQUE: Multiplanar, multisequence MR imaging of the cervical spine was performed. No intravenous contrast was administered.  COMPARISON:  MRI cervical spine from 12/17/2016  FINDINGS: Despite efforts by the technologist and patient, motion artifact is present on today's exam and could not be eliminated. This reduces exam sensitivity and specificity.  Alignment: No vertebral subluxation is observed.  Vertebrae: The congenitally short pedicles in the cervical spine. No significant vertebral marrow edema is identified. Loss of intervertebral disc height at the C5-6 level. New mild degenerative spurring at the craniocervical the junction. The  Cord: No obvious cord abnormality, but the cord is indistinct due to motion artifact and poor signal to noise ratio.  Posterior Fossa, vertebral arteries, paraspinal tissues: Unremarkable  Disc levels:  C2-3:  Unremarkable  C3-4:  Unremarkable  C4-5: Moderate right and borderline left foraminal stenosis with mild central narrowing of the thecal sac due to right paracentral disc protrusion, disc bulge, uncinate spurring, and short pedicles. Appearance worsened from prior.  C5-6: Moderate bilateral foraminal stenosis and moderate right eccentric central narrowing of the thecal sac due to right paracentral on lateral recess disc protrusion, disc bulge, and uncinate spurring. The these findings have worsened compared to prior exam.  C6-7: Mild bilateral foraminal stenosis and mild central narrowing of the thecal sac due to disc bulge and  uncinate spurring along with short pedicles.  C7-T1:  No impingement.  Minimal disc bulge.  IMPRESSION: 1. Congenitally short pedicles with worsening cervical spondylosis and degenerative disc disease, causing moderate impingement at C4-5 and C5-6. The central narrowing of the thecal sac at C5-6 has worsened compared to the prior exam, with AP diameter of the thecal sac at 7 mm currently, previously 8 mm. 2. Despite efforts by the technologist and patient, motion artifact is present on today's exam and could not be eliminated. This reduces exam sensitivity and specificity.  Electronically Signed   By: Van Clines M.D.   On: 10/13/2017 15:37 DG Chest 2 View CLINICAL DATA:  Left arm pain for 2 months, increased recently.  EXAM: CHEST  2 VIEW  COMPARISON:  CT chest and chest radiograph 05/24/2017.  FINDINGS: Trachea is midline. Heart size stable. Lungs are clear. No pleural fluid. Mild degenerative changes in the spine.  IMPRESSION: No acute findings.  Electronically Signed   By: Lorin Picket M.D.   On: 10/13/2017 13:45  Complexity Note: Imaging  results reviewed. Results shared with [name], using Layman's terms.                         Meds   Current Outpatient Medications:  .  b complex-vitamin c-folic acid (NEPHRO-VITE) 0.8 MG TABS tablet, Take 1 tablet by mouth daily. , Disp: , Rfl:  .  calcium acetate (PHOSLO) 667 MG capsule, Take 1,334 mg by mouth 3 (three) times daily with meals. , Disp: , Rfl:  .  clobetasol ointment (TEMOVATE) 1.96 %, Apply 1 application topically daily as needed (for psorasis). , Disp: , Rfl:  .  diltiazem (TIAZAC) 120 MG 24 hr capsule, Take 240 mg by mouth 2 (two) times daily. , Disp: , Rfl:  .  insulin glargine (LANTUS) 100 UNIT/ML injection, Inject 15 Units into the skin daily. , Disp: , Rfl:  .  insulin regular (HUMULIN R) 100 units/mL injection, Inject as directed Three (3) times a day with a meal., Disp: , Rfl:  .  loperamide  (IMODIUM A-D) 2 MG tablet, Take 2 mg by mouth 3 (three) times daily as needed. , Disp: , Rfl:  .  midodrine (PROAMATINE) 5 MG tablet, Take 5 mg by mouth as needed (when blood pressure is low)., Disp: , Rfl:  .  oxyCODONE (OXY IR/ROXICODONE) 5 MG immediate release tablet, Take 1 tablet (5 mg total) by mouth 3 (three) times daily as needed for severe pain. For chronic pain To fill on or after: 11/17/17, 12/16/17 To last 30 days from fill date, Disp: 90 tablet, Rfl: 0 .  promethazine (PHENERGAN) 25 MG tablet, Take 1 tablet (25 mg total) by mouth every 4 (four) hours as needed for nausea or vomiting., Disp: 30 tablet, Rfl: 1 .  sevelamer carbonate (RENVELA) 800 MG tablet, Take 1,600 mg by mouth 3 (three) times daily with meals., Disp: , Rfl:  .  escitalopram (LEXAPRO) 10 MG tablet, Take by mouth., Disp: , Rfl:  .  gabapentin (NEURONTIN) 100 MG capsule, Take 1 capsule (100 mg total) by mouth 3 (three) times daily. OR 3 capsules at bedtime. (Patient not taking: Reported on 11/17/2017), Disp: 90 capsule, Rfl: 3 .  glucose 4 GM chewable tablet, Chew 1 tablet (4 g total) by mouth as needed for low blood sugar. (Patient not taking: Reported on 11/17/2017), Disp: 50 tablet, Rfl: 12 .  hydrALAZINE (APRESOLINE) 100 MG tablet, Take 100 mg by mouth., Disp: , Rfl:  .  insulin regular (NOVOLIN R,HUMULIN R) 100 units/mL injection, Inject 0.02-0.04 mLs (2-4 Units total) into the skin 3 (three) times daily with meals. (Patient not taking: Reported on 11/17/2017), Disp: 10 mL, Rfl: 2 .  moxifloxacin (VIGAMOX) 0.5 % ophthalmic solution, Administer 1 drop to the right eye Four (4) times a day., Disp: , Rfl:   ROS  Constitutional: Denies any fever or chills Gastrointestinal: No reported hemesis, hematochezia, vomiting, or acute GI distress Musculoskeletal: Denies any acute onset joint swelling, redness, loss of ROM, or weakness Neurological: No reported episodes of acute onset apraxia, aphasia, dysarthria, agnosia, amnesia,  paralysis, loss of coordination, or loss of consciousness  Allergies  [name] is allergic to cinnamon; garlic; onion; tylenol [acetaminophen]; ciprofloxacin; and prednisone.  [place]  Drug: [name]  reports that she does not use drugs. Alcohol:  reports that she does not drink alcohol. Tobacco:  reports that she has been smoking cigarettes.  She has a 5.00 pack-year smoking history. she has never used smokeless tobacco. Medical:  has  a past medical history of Allergy, Anemia, Anxiety, Arthritis, Ataxia, CHF (congestive heart failure) ([place]), Chronic kidney disease, COPD (chronic obstructive pulmonary disease) ([place]), Depression, Diabetes mellitus without complication ([place]), Emphysema of lung ([place]), End stage renal disease ([place]), Fistula, Headache, Hepatitis (2003), Hiatal hernia, Hypercholesterolemia, Hypertension, Kidney disease, Neuropathy, diabetic ([place]), Peripheral vascular disease ([place]), Pneumonia (2015), Psoriasis, Tobacco dependence, and Wears dentures. Surgical: [name]  has a past surgical history that includes Tonsillectomy; rt. tubal and ovary removed; Cholecystectomy; Cardiac catheterization (N/A, 07/25/2015); Cardiac catheterization (Left, 07/25/2015); Cardiac catheterization (Left, 10/07/2015); Cardiac catheterization (N/A, 10/07/2015); Cardiac catheterization (10/07/2015); Cardiac catheterization (N/A, 12/17/2015); Incision and drainage abscess (N/A, 07/16/2016); cyst removed  from left hand (Left, 1989); AV fistula placement (Left, 11/2014); Cardiac catheterization (N/A, 01/11/2017); Cardiac catheterization (N/A, 01/11/2017); DIALYSIS/PERMA CATHETER INSERTION (N/A, 05/20/2017); Cesarean section; Tubal ligation; Colonoscopy (N/A, 09/22/2017); Esophagogastroduodenoscopy (N/A, 09/22/2017); and polypectomy (09/22/2017). Family: family history includes Heart disease in her mother; Hypertension in her father and mother; Skin cancer in her father.  Constitutional Exam  General appearance: Well  nourished, well developed, and well hydrated. In no apparent acute distress Vitals:   11/17/17 1355  BP: (!) 170/81  Pulse: 77  Resp: 18  Temp: 98.4 F (36.9 C)  TempSrc: Oral  SpO2: 100%  Weight: 203 lb (92.1 kg)  Height: '5\' 7"'$  (1.702 m)   BMI Assessment: Estimated body mass index is 31.79 kg/m as calculated from the following:   Height as of this encounter: '5\' 7"'$  (1.702 m).   Weight as of this encounter: 203 lb (92.1 kg).  BMI interpretation table: BMI level Category Range association with higher incidence of chronic pain  <18 kg/m2 Underweight   18.5-24.9 kg/m2 Ideal body weight   25-29.9 kg/m2 Overweight Increased incidence by 20%  30-34.9 kg/m2 Obese (Class I) Increased incidence by 68%  35-39.9 kg/m2 Severe obesity (Class II) Increased incidence by 136%  >40 kg/m2 Extreme obesity (Class III) Increased incidence by 254%   BMI Readings from Last 4 Encounters:  11/17/17 31.79 kg/m  11/09/17 32.26 kg/m  10/13/17 32.11 kg/m  10/05/17 32.11 kg/m   Wt Readings from Last 4 Encounters:  11/17/17 203 lb (92.1 kg)  11/09/17 206 lb (93.4 kg)  10/13/17 205 lb (93 kg)  10/05/17 205 lb (93 kg)  Psych/Mental status: Alert, oriented x 3 (person, place, & time)       Eyes: PERLA Respiratory: No evidence of acute respiratory distress  Cervical Spine Area Exam  Skin & Axial Inspection: No masses, redness, edema, swelling, or associated skin lesions Alignment: Asymmetric Functional ROM: Decreased ROM, to the right Stability: No instability detected Muscle Tone/Strength: Functionally intact. No obvious neuro-muscular anomalies detected. Sensory (Neurological): Dermatomal pain pattern Palpation: Complains of area being tender to palpation             Positive Spurling's on the right.  Limited right cervical extension secondary to pain and onset of paresthesias. Upper Extremity (UE) Exam    Side: Right upper extremity  Side: Left upper extremity  Skin & Extremity Inspection:  Skin color, temperature, and hair growth are WNL. No peripheral edema or cyanosis. No masses, redness, swelling, asymmetry, or associated skin lesions. No contractures.  Skin & Extremity Inspection: Skin color, temperature, and hair growth are WNL. No peripheral edema or cyanosis. No masses, redness, swelling, asymmetry, or associated skin lesions. No contractures.  Functional ROM: Unrestricted ROM          Functional ROM: Unrestricted ROM  Muscle Tone/Strength: Functionally intact. No obvious neuro-muscular anomalies detected.  Muscle Tone/Strength: Functionally intact. No obvious neuro-muscular anomalies detected.  Sensory (Neurological): Unimpaired          Sensory (Neurological): Unimpaired          Palpation: No palpable anomalies              Palpation: No palpable anomalies              Specialized Test(s): Deferred         Specialized Test(s): Deferred          Thoracic Spine Area Exam  Skin & Axial Inspection: No masses, redness, or swelling Alignment: Symmetrical Functional ROM: Unrestricted ROM Stability: No instability detected Muscle Tone/Strength: Functionally intact. No obvious neuro-muscular anomalies detected. Sensory (Neurological): Unimpaired Muscle strength & Tone: No palpable anomalies  Lumbar Spine Area Exam  Skin & Axial Inspection: No masses, redness, or swelling Alignment: Symmetrical Functional ROM: Unrestricted ROM      Stability: No instability detected Muscle Tone/Strength: Functionally intact. No obvious neuro-muscular anomalies detected. Sensory (Neurological): Unimpaired Palpation: No palpable anomalies       Provocative Tests: Lumbar Hyperextension and rotation test: evaluation deferred today       Lumbar Lateral bending test: evaluation deferred today       Patrick's Maneuver: evaluation deferred today                    Gait & Posture Assessment  Ambulation: Unassisted Gait: Relatively normal for age and body habitus Posture: WNL   Lower  Extremity Exam    Side: Right lower extremity  Side: Left lower extremity  Skin & Extremity Inspection: Skin color, temperature, and hair growth are WNL. No peripheral edema or cyanosis. No masses, redness, swelling, asymmetry, or associated skin lesions. No contractures.  Skin & Extremity Inspection: Skin color, temperature, and hair growth are WNL. No peripheral edema or cyanosis. No masses, redness, swelling, asymmetry, or associated skin lesions. No contractures.  Functional ROM: Unrestricted ROM          Functional ROM: Unrestricted ROM          Muscle Tone/Strength: Functionally intact. No obvious neuro-muscular anomalies detected.  Muscle Tone/Strength: Functionally intact. No obvious neuro-muscular anomalies detected.  Sensory (Neurological): Unimpaired  Sensory (Neurological): Unimpaired  Palpation: No palpable anomalies  Palpation: No palpable anomalies   Assessment  Primary Diagnosis & Pertinent Problem List: The primary encounter diagnosis was DDD (degenerative disc disease), cervical. Diagnoses of Spondylosis of cervical region without myelopathy or radiculopathy, Cervicalgia, Lumbar spondylosis, and Cervical radiculopathy were also pertinent to this visit.  Status Diagnosis  Worsening Worsening Worsening 1. DDD (degenerative disc disease), cervical   2. Spondylosis of cervical region without myelopathy or radiculopathy   3. Cervicalgia   4. Lumbar spondylosis   5. Cervical radiculopathy      51 year old female with a complex past medical history including anxiety, end-stage kidney disease on dialysis, depression, diabetes, migraines, hepatitis C, peripheral vascular disease who presents with chronic neck, right upper quadrant, andback pain. Patient previously seen by Dr. Humphrey Rolls of [place] [facility]. She was previously on fentanyl patch at 23 mics has also tried oxycodone and OxyContin in the past which were not effective and resulted in nausea and mental status changes.  Patient's cervical MRI shows C4-C5 right foraminal disc protrusion and moderate foraminal stenosis and C5-C6 broad central disc protrusion compressing the ventral cervical spinal cord with moderate spinal canal stenosis. Lumbar MRI shows disc  protrusion at L5-S1. Patient low risk for substance abuse disorder per pain psych evaluation.  Since the patient's last visit, she did have a visit to the emergency department for worsening right extremity pain.  MRI of her cervical spine was completed and showed interval disease progression and worsening ventral cervical spinal cord compression at C5-C6.  I had another discussion with the patient about cervical epidural steroid injections for her cervical radiculopathy versus cervical facet blocks for cervical spondylosis.  I suggested starting with cervical epidural steroid injection since her radicular symptoms seem to be worsening. Patient states that she is concerned about her blood glucose control and wants to avoid any steroid therapy at the moment so that she can try to get her blood glucose better controlled.  When I offered to attempt the left cervical facet blocks without steroid, the patient stated that she would think about it and let me know.  I will send the patient to Dr. Cari Caraway with neurosurgery to discuss patient's cervical stenosis most pronounced at C5-C6 and discuss if surgery is indicated.  I recommended the patient try interventional therapies with cervical epidural steroid injection at right C7-T1 for her cervical radicular symptoms and/or a right sided cervical facet block for her cervical spondylosis.  Also we will renew the patient's oxycodone prescription for 2 months.  She can follow-up with me prior to then if she is interested in pursuing interventional therapies for her neck including cervical ESI versus cervical facet blocks.  Plan: -Prescription for oxycodone below for 2 months. -Referral to Dr. Cari Caraway, neurosurgery to discuss  whether surgical intervention would be helpful for her cervical stenosis -Instructed patient to contact me if she would like to proceed with cervical epidural steroid injection or cervical facet blocks. (right C7-T1 ESI for cervical radiculopathy, right-sided neck pain secondary to cervical spondylosis, consider right C4-C5 C6-C7 medial branch nerve block diagnostic) -Follow-up in 2 months for medication management   Plan of Care  Pharmacotherapy (Medications Ordered): Meds ordered this encounter  Medications  . DISCONTD: oxyCODONE (OXY IR/ROXICODONE) 5 MG immediate release tablet    Sig: Take 1 tablet (5 mg total) by mouth 3 (three) times daily as needed for severe pain. For chronic pain To fill on or after: 11/17/17, 12/16/17 To last 30 days from fill date    Dispense:  90 tablet    Refill:  0    Do not place this medication, or any other prescription from our practice, on "Automatic Refill". Patient may have prescription filled one day early if pharmacy is closed on scheduled refill date.  Marland Kitchen oxyCODONE (OXY IR/ROXICODONE) 5 MG immediate release tablet    Sig: Take 1 tablet (5 mg total) by mouth 3 (three) times daily as needed for severe pain. For chronic pain To fill on or after: 11/17/17, 12/16/17 To last 30 days from fill date    Dispense:  90 tablet    Refill:  0    Do not place this medication, or any other prescription from our practice, on "Automatic Refill". Patient may have prescription filled one day early if pharmacy is closed on scheduled refill date.   Lab-work, procedure(s), and/or referral(s): Orders Placed This Encounter  Procedures  . Ambulatory referral to Neurosurgery   Provider-requested follow-up: Return in about 8 weeks (around 01/12/2018) for Medication Management.  Future Appointments  Date Time Provider [place]  11/23/2017 10:30 AM [facility] V[facility] 1 [facility] None  11/23/2017 11:30 AM Ste[place]ayer, Janalyn Harder, PA-C [facility] None  01/11/2018 12:15 PM Maylyn Narvaiz,  Carlus Pavlov, [place] None    Primary Care Physician: Mikey College, NP Location: [facility] Note by: Gillis Santa, M.D Date: 11/17/2017; Time: 4:01 PM  Patient Instructions  1. Review literature on cervical facet blocks 2. Rx for Oxycodone for 2 months 3. Referral to neurosurgery  4 Follow up in 2 months

## 2017-11-17 NOTE — Patient Instructions (Signed)
1. Review literature on cervical facet blocks 2. Rx for Oxycodone for 2 months 3. Referral to neurosurgery  4 Follow up in 2 months

## 2017-11-17 NOTE — Progress Notes (Signed)
Nursing Pain Medication Assessment:  Safety precautions to be maintained throughout the outpatient stay will include: orient to surroundings, keep bed in low position, maintain call bell within reach at all times, provide assistance with transfer out of bed and ambulation.  Medication Inspection Compliance: Pill count conducted under aseptic conditions, in front of the patient. Neither the pills nor the bottle was removed from the patient's sight at any time. Once count was completed pills were immediately returned to the patient in their original bottle.  Medication: Oxycodone IR Pill/Patch Count: 0 of 90 pills remain Pill/Patch Appearance: Markings consistent with prescribed medication Bottle Appearance: Standard pharmacy container. Clearly labeled. Filled Date:10/16/ 2018 Last Medication intake:  Today

## 2017-11-18 ENCOUNTER — Telehealth: Payer: Self-pay

## 2017-11-18 NOTE — Telephone Encounter (Signed)
Mavyret has been approved by [employer]  Authorization # is [id number] 11/16/17-01/11/18.  Contacted Todd with [employer] and will proceed with getting pt medication.  Thanks [employer]

## 2017-11-19 DIAGNOSIS — R110 Nausea: Secondary | ICD-10-CM | POA: Diagnosis not present

## 2017-11-19 DIAGNOSIS — D509 Iron deficiency anemia, unspecified: Secondary | ICD-10-CM | POA: Diagnosis not present

## 2017-11-19 DIAGNOSIS — D631 Anemia in chronic kidney disease: Secondary | ICD-10-CM | POA: Diagnosis not present

## 2017-11-19 DIAGNOSIS — N2581 Secondary hyperparathyroidism of renal origin: Secondary | ICD-10-CM | POA: Diagnosis not present

## 2017-11-19 DIAGNOSIS — N186 End stage renal disease: Secondary | ICD-10-CM | POA: Diagnosis not present

## 2017-11-19 DIAGNOSIS — Z992 Dependence on renal dialysis: Secondary | ICD-10-CM | POA: Diagnosis not present

## 2017-11-22 DIAGNOSIS — R110 Nausea: Secondary | ICD-10-CM | POA: Diagnosis not present

## 2017-11-22 DIAGNOSIS — D631 Anemia in chronic kidney disease: Secondary | ICD-10-CM | POA: Diagnosis not present

## 2017-11-22 DIAGNOSIS — N186 End stage renal disease: Secondary | ICD-10-CM | POA: Diagnosis not present

## 2017-11-22 DIAGNOSIS — Z992 Dependence on renal dialysis: Secondary | ICD-10-CM | POA: Diagnosis not present

## 2017-11-22 DIAGNOSIS — R1110 Vomiting, unspecified: Secondary | ICD-10-CM | POA: Diagnosis not present

## 2017-11-22 DIAGNOSIS — D509 Iron deficiency anemia, unspecified: Secondary | ICD-10-CM | POA: Diagnosis not present

## 2017-11-22 DIAGNOSIS — N2581 Secondary hyperparathyroidism of renal origin: Secondary | ICD-10-CM | POA: Diagnosis not present

## 2017-11-23 ENCOUNTER — Other Ambulatory Visit (INDEPENDENT_AMBULATORY_CARE_PROVIDER_SITE_OTHER): Payer: Self-pay | Admitting: Vascular Surgery

## 2017-11-23 ENCOUNTER — Ambulatory Visit (INDEPENDENT_AMBULATORY_CARE_PROVIDER_SITE_OTHER): Payer: Medicare Other | Admitting: Vascular Surgery

## 2017-11-23 ENCOUNTER — Telehealth: Payer: Self-pay

## 2017-11-23 ENCOUNTER — Other Ambulatory Visit: Payer: Self-pay

## 2017-11-23 ENCOUNTER — Encounter (INDEPENDENT_AMBULATORY_CARE_PROVIDER_SITE_OTHER): Payer: Self-pay | Admitting: Vascular Surgery

## 2017-11-23 ENCOUNTER — Telehealth: Payer: Self-pay | Admitting: Gastroenterology

## 2017-11-23 ENCOUNTER — Other Ambulatory Visit (INDEPENDENT_AMBULATORY_CARE_PROVIDER_SITE_OTHER): Payer: Medicare Other

## 2017-11-23 VITALS — BP 123/78 | HR 77 | Resp 16 | Ht 67.0 in | Wt 206.8 lb

## 2017-11-23 DIAGNOSIS — N186 End stage renal disease: Secondary | ICD-10-CM

## 2017-11-23 DIAGNOSIS — M5030 Other cervical disc degeneration, unspecified cervical region: Secondary | ICD-10-CM | POA: Diagnosis not present

## 2017-11-23 DIAGNOSIS — E118 Type 2 diabetes mellitus with unspecified complications: Secondary | ICD-10-CM | POA: Diagnosis not present

## 2017-11-23 DIAGNOSIS — E1165 Type 2 diabetes mellitus with hyperglycemia: Secondary | ICD-10-CM | POA: Diagnosis not present

## 2017-11-23 DIAGNOSIS — Z720 Tobacco use: Secondary | ICD-10-CM | POA: Diagnosis not present

## 2017-11-23 DIAGNOSIS — M79601 Pain in right arm: Secondary | ICD-10-CM

## 2017-11-23 DIAGNOSIS — Z992 Dependence on renal dialysis: Secondary | ICD-10-CM

## 2017-11-23 DIAGNOSIS — IMO0002 Reserved for concepts with insufficient information to code with codable children: Secondary | ICD-10-CM

## 2017-11-23 MED ORDER — GLECAPREVIR-PIBRENTASVIR 100-40 MG PO TABS: 3.0000 | ORAL_TABLET | Freq: Every day | ORAL | 2 refills | Status: DC

## 2017-11-23 NOTE — Progress Notes (Signed)
Subjective:    Patient ID: [name], female    DOB: June 09, 1966, 51 y.o.   MRN: [id number] Chief Complaint  Patient presents with  . Follow-up   Patient presents with a chief complaint of a poorly functioning left upper extremity dialysis access.  The patient has been experiencing "clots" during dialysis treatments.  The patient is also experiencing difficult cannulations.  The patient denies any left upper extremity pain, edema or ulceration.  The patient underwent a left upper extremity HDA which was notable for a patent left brachiocephalic AV fistula with a hemodynamically significant velocity increase at the subclavian cephalic vein confluence (470) and increased velocity at the antecubital fossa (327).  Patient's flow volume is 1111.  The patient is also complaining of right upper extremity pain.  The patient has noticed a progressive weakness to the right hand.  The patient had a recent MRI which was notable for degenerative disc disease of the cervical spine.  The patient continues to be treated for her chronic pain issues at the [facility] pain clinic.  The patient has an upcoming appointment with a neurosurgeon for her cervical disease.    Review of Systems  Constitutional: Negative.   HENT: Negative.   Eyes: Negative.   Respiratory: Negative.   Cardiovascular:       Right upper extremity pain Left upper extremity dialysis access difficulty  Gastrointestinal: Negative.   Endocrine: Negative.   Genitourinary: Negative.   Musculoskeletal: Negative.   Skin: Negative.   Allergic/Immunologic: Negative.   Neurological: Negative.   Hematological: Negative.   Psychiatric/Behavioral: Negative.       Objective:   Physical Exam  Constitutional: She is oriented to person, place, and time. She appears well-developed and well-nourished. No distress.  HENT:  Head: Normocephalic and atraumatic.  Eyes: Conjunctivae are normal. Pupils are equal, round, and  reactive to light.  Neck: Normal range of motion.  Cardiovascular: Normal rate, regular rhythm, normal heart sounds and intact distal pulses.  Pulses:      Radial pulses are 2+ on the right side, and 2+ on the left side.  Patient with 2+ radial pulses bilaterally. Right upper extremity without edema, pallor or ulceration. Left upper extremity dialysis access site: There are 2 relatively stable in size aneurysms noted.  Skin is intact.  There is no skin threatening.  Pulmonary/Chest: Effort normal and breath sounds normal.  Musculoskeletal: Normal range of motion. She exhibits no edema.  Neurological: She is alert and oriented to person, place, and time.  Skin: Skin is warm and dry. She is not diaphoretic.  Psychiatric: She has a normal mood and affect. Her behavior is normal. Judgment and thought content normal.  Vitals reviewed.  BP 123/78 (BP Location: Right Arm)   Pulse 77   Resp 16   Ht 5\' 7"  (1.702 m)   Wt 206 lb 12.8 oz (93.8 kg)   BMI 32.39 kg/m   Past Medical History:  Diagnosis Date  . Allergy   . Anemia   . Anxiety    worse when not at home - bowel incontinence  . Arthritis   . Ataxia   . CHF (congestive heart failure) (HCC)    history of, has been cleared.  . Chronic kidney disease   . COPD (chronic obstructive pulmonary disease) ([place])   . Depression   . Diabetes mellitus without complication ([place])   . Emphysema of lung ([place])   . End stage renal disease ([place])    MWF dialysis  .  Fistula    left upper arm  . Headache    migraines  . Hepatitis 2003   Hep C  . Hiatal hernia   . Hypercholesterolemia    pt denies  . Hypertension   . Kidney disease   . Neuropathy, diabetic (HCC)    lower legs  . Peripheral vascular disease ([place])   . Pneumonia 2015  . Psoriasis   . Tobacco dependence   . Wears dentures    full upper, partial lower   Social History   Socioeconomic History  . Marital status: Legally Separated    Spouse name: Not on file  . Number of  children: Not on file  . Years of education: Not on file  . Highest education level: Not on file  Social Needs  . Financial resource strain: Not on file  . Food insecurity - worry: Not on file  . Food insecurity - inability: Not on file  . Transportation needs - medical: Not on file  . Transportation needs - non-medical: Not on file  Occupational History  . Occupation: disabled  Tobacco Use  . Smoking status: Light Tobacco Smoker    Packs/day: 0.25    Years: 20.00    Pack years: 5.00    Types: Cigarettes    Last attempt to quit: 05/21/2017    Years since quitting: 0.5  . Smokeless tobacco: Never Used  Substance and Sexual Activity  . Alcohol use: No  . Drug use: No  . Sexual activity: Not on file  Other Topics Concern  . Not on file  Social History Narrative  . Not on file   Past Surgical History:  Procedure Laterality Date  . AV FISTULA PLACEMENT Left 11/2014  . CESAREAN SECTION    . CHOLECYSTECTOMY    . COLONOSCOPY N/A 09/22/2017   Procedure: COLONOSCOPY;  Surgeon: Lin Landsman, MD;  Location: [place];  Service: Endoscopy;  Laterality: N/A;  . cyst removed  from left hand Left 1989  . DIALYSIS/PERMA CATHETER INSERTION N/A 05/20/2017   Procedure: Dialysis/Perma Catheter Insertion and fistulagram/LUE angiogram;  Surgeon: Algernon Huxley, MD;  Location: [place] [facility];  Service: Cardiovascular;  Laterality: N/A;  . ESOPHAGOGASTRODUODENOSCOPY N/A 09/22/2017   Procedure: ESOPHAGOGASTRODUODENOSCOPY (EGD);  Surgeon: Lin Landsman, MD;  Location: [place];  Service: Endoscopy;  Laterality: N/A;  . INCISION AND DRAINAGE ABSCESS N/A 07/16/2016   Procedure: INCISION AND DRAINAGE ABSCESS;  Surgeon: Carloyn Manner, MD;  Location: [facility];  Service: ENT;  Laterality: N/A;  . PERIPHERAL VASCULAR CATHETERIZATION N/A 07/25/2015   Procedure: A/V Shuntogram/Fistulagram;  Surgeon: Algernon Huxley, MD;  Location: [place] [facility];  Service: Cardiovascular;   Laterality: N/A;  . PERIPHERAL VASCULAR CATHETERIZATION Left 07/25/2015   Procedure: A/V Shunt Intervention;  Surgeon: Algernon Huxley, MD;  Location: [place] [facility];  Service: Cardiovascular;  Laterality: Left;  . PERIPHERAL VASCULAR CATHETERIZATION Left 10/07/2015   Procedure: A/V Shuntogram/Fistulagram;  Surgeon: Algernon Huxley, MD;  Location: [place] [facility];  Service: Cardiovascular;  Laterality: Left;  . PERIPHERAL VASCULAR CATHETERIZATION N/A 10/07/2015   Procedure: A/V Shunt Intervention;  Surgeon: Algernon Huxley, MD;  Location: [place] [facility];  Service: Cardiovascular;  Laterality: N/A;  . PERIPHERAL VASCULAR CATHETERIZATION  10/07/2015   Procedure: Dialysis/Perma Catheter Insertion;  Surgeon: Algernon Huxley, MD;  Location: [place] [facility];  Service: Cardiovascular;;  . PERIPHERAL VASCULAR CATHETERIZATION N/A 12/17/2015   Procedure: Dialysis/Perma Catheter Removal;  Surgeon: Dolores Lory  Schnier, MD;  Location: [place] [facility];  Service: Cardiovascular;  Laterality: N/A;  . PERIPHERAL VASCULAR CATHETERIZATION N/A 01/11/2017   Procedure: Visceral Angiography;  Surgeon: Algernon Huxley, MD;  Location: [place] [facility];  Service: Cardiovascular;  Laterality: N/A;  . PERIPHERAL VASCULAR CATHETERIZATION N/A 01/11/2017   Procedure: Visceral Artery Intervention;  Surgeon: Algernon Huxley, MD;  Location: [place] [facility];  Service: Cardiovascular;  Laterality: N/A;  . POLYPECTOMY  09/22/2017   Procedure: POLYPECTOMY;  Surgeon: Lin Landsman, MD;  Location: [place];  Service: Endoscopy;;  . rt. tubal and ovary removed    . TONSILLECTOMY    . TUBAL LIGATION     Family History  Problem Relation Age of Onset  . Hypertension Mother   . Heart disease Mother   . Hypertension Father   . Skin cancer Father    Allergies  Allergen Reactions  . Cinnamon Anaphylaxis  . Garlic Anaphylaxis and Hives  . Onion Hives and Swelling  . Tylenol [Acetaminophen] Anaphylaxis  .  Ciprofloxacin Diarrhea  . Prednisone Other (See Comments)    Vaginal blisters & oral blisters      Assessment & Plan:  Patient presents with a chief complaint of a poorly functioning left upper extremity dialysis access.  The patient has been experiencing "clots" during dialysis treatments.  The patient is also experiencing difficult cannulations.  The patient denies any left upper extremity pain, edema or ulceration.  The patient underwent a left upper extremity HDA which was notable for a patent left brachiocephalic AV fistula with a hemodynamically significant velocity increase at the subclavian cephalic vein confluence (470) and increased velocity at the antecubital fossa (327).  Patient's flow volume is 1111.  The patient is also complaining of right upper extremity pain.  The patient has noticed a progressive weakness to the right hand.  The patient had a recent MRI which was notable for degenerative disc disease of the cervical spine.  The patient continues to be treated for her chronic pain issues at the [facility] pain clinic.  The patient has an upcoming appointment with a neurosurgeon for her cervical disease.   1. ESRD (end stage renal disease) on dialysis ([place]) - Stable Patient with progressively worsening difficulties during dialysis To areas of significant stenosis noted on HDA today Recommend a left upper extremity fistulogram with possible intervention to assess anatomy and possibly correct any areas of narrowing Procedure, risks and benefits declined to the patient Questions answered She is willing to proceed Patient's right upper extremity discomfort is most likely due to her cervical DDD however I will order a right upper extremity arterial duplex to assess for any contributing peripheral artery disease as the patient does have a history of this. Patient is to follow up with me after the arterial duplex  - VAS [place] LOWER EXTREMITY ARTERIAL DUPLEX;  Future  2. DDD (degenerative disc disease), cervical - Stable This is most likely the contributing factor to the patient's right upper extremity pain and weakness The patient has an appointment with a neurosurgeon for further care  3. Tobacco abuse - Stable We had a discussion for approximately 10 minutes regarding the absolute need for smoking cessation due to the deleterious nature of tobacco on the vascular system. We discussed the tobacco use would diminish patency of any intervention, and likely significantly worsen progressio of disease. We discussed multiple agents for quitting including replacement therapy or medications to reduce cravings such as Chantix. The patient  voices their understanding of the importance of smoking cessation.  4. Diabetes mellitus type 2, uncontrolled, with complications ([place]) - Stable Encouraged good control as its slows the progression of atherosclerotic disease  Current Outpatient Medications on File Prior to Visit  Medication Sig Dispense Refill  . b complex-vitamin c-folic acid (NEPHRO-VITE) 0.8 MG TABS tablet Take 1 tablet by mouth daily.     . calcium acetate (PHOSLO) 667 MG capsule Take 1,334 mg by mouth 3 (three) times daily with meals.     . clobetasol ointment (TEMOVATE) 4.62 % Apply 1 application topically daily as needed (for psorasis).     Marland Kitchen diltiazem (TIAZAC) 120 MG 24 hr capsule Take 240 mg by mouth 2 (two) times daily.     Marland Kitchen glucose 4 GM chewable tablet Chew 1 tablet (4 g total) by mouth as needed for low blood sugar. 50 tablet 12  . insulin glargine (LANTUS) 100 UNIT/ML injection Inject 15 Units into the skin daily.     . insulin regular (HUMULIN R) 100 units/mL injection Inject as directed Three (3) times a day with a meal.    . loperamide (IMODIUM A-D) 2 MG tablet Take 2 mg by mouth 3 (three) times daily as needed.     . midodrine (PROAMATINE) 5 MG tablet Take 5 mg by mouth as needed (when blood pressure is low).    Marland Kitchen oxyCODONE (OXY  IR/ROXICODONE) 5 MG immediate release tablet Take 1 tablet (5 mg total) by mouth 3 (three) times daily as needed for severe pain. For chronic pain To fill on or after: 11/17/17, 12/16/17 To last 30 days from fill date 90 tablet 0  . promethazine (PHENERGAN) 25 MG tablet Take 1 tablet (25 mg total) by mouth every 4 (four) hours as needed for nausea or vomiting. 30 tablet 1  . sevelamer carbonate (RENVELA) 800 MG tablet Take 1,600 mg by mouth 3 (three) times daily with meals.    Marland Kitchen escitalopram (LEXAPRO) 10 MG tablet Take by mouth.    . gabapentin (NEURONTIN) 100 MG capsule Take 1 capsule (100 mg total) by mouth 3 (three) times daily. OR 3 capsules at bedtime. (Patient not taking: Reported on 11/17/2017) 90 capsule 3  . hydrALAZINE (APRESOLINE) 100 MG tablet Take 100 mg by mouth.    . insulin regular (NOVOLIN R,HUMULIN R) 100 units/mL injection Inject 0.02-0.04 mLs (2-4 Units total) into the skin 3 (three) times daily with meals. (Patient not taking: Reported on 11/17/2017) 10 mL 2  . moxifloxacin (VIGAMOX) 0.5 % ophthalmic solution Administer 1 drop to the right eye Four (4) times a day.     No current facility-administered medications on file prior to visit.    There are no Patient Instructions on file for this visit. No Follow-up on file.  Adis Sturgill A Meron Bocchino, PA-C

## 2017-11-23 NOTE — Telephone Encounter (Signed)
Patient [name] to please call her regarding her Hep C medication.

## 2017-11-23 NOTE — Telephone Encounter (Signed)
Contacted Dorian Pod to see what the next step is to getting patient her medication to her.  Dorian Pod stated that I needed to fax rx to [facility] (completed)  Then they will contact pt, then they will contact

## 2017-11-24 ENCOUNTER — Telehealth: Payer: Self-pay | Admitting: Gastroenterology

## 2017-11-24 ENCOUNTER — Telehealth (INDEPENDENT_AMBULATORY_CARE_PROVIDER_SITE_OTHER): Payer: Self-pay

## 2017-11-24 DIAGNOSIS — D631 Anemia in chronic kidney disease: Secondary | ICD-10-CM | POA: Diagnosis not present

## 2017-11-24 DIAGNOSIS — N186 End stage renal disease: Secondary | ICD-10-CM | POA: Diagnosis not present

## 2017-11-24 DIAGNOSIS — D509 Iron deficiency anemia, unspecified: Secondary | ICD-10-CM | POA: Diagnosis not present

## 2017-11-24 DIAGNOSIS — R110 Nausea: Secondary | ICD-10-CM | POA: Diagnosis not present

## 2017-11-24 DIAGNOSIS — N2581 Secondary hyperparathyroidism of renal origin: Secondary | ICD-10-CM | POA: Diagnosis not present

## 2017-11-24 DIAGNOSIS — Z992 Dependence on renal dialysis: Secondary | ICD-10-CM | POA: Diagnosis not present

## 2017-11-24 NOTE — Telephone Encounter (Signed)
Informed pt that rx for Mavyret has been received by [employer].  They will contact her to discuss billing.  Then they will send first month of medication for her to start to our office.  Thanks [employer]

## 2017-11-24 NOTE — Telephone Encounter (Signed)
Attempted to contact the patient on 11/23/17 to schedule her angio and called again today and left a message with the husband for her to return a call to me.

## 2017-11-24 NOTE — Telephone Encounter (Signed)
Pts call has been returned-left voice message for her to call me back.  Thanks [employer]

## 2017-11-24 NOTE — Telephone Encounter (Signed)
Patient left a voice message that they are upset that they haven't spoke to anyone here. They understand you have left messages but need to start medication. Please call

## 2017-11-25 ENCOUNTER — Telehealth: Payer: Self-pay

## 2017-11-25 NOTE — Telephone Encounter (Signed)
Returned phone call to [facility].  Pts rx for Mavyret was  approved for 8 weeks (therapeutic doseage per insurance) We sent rx in for 12 weeks.  If after 8 weeks of treatment -we should need the additional 4 weeks of therapy we will need to do contact the insurance company for another prior authorization.  The insurance company will contact pt to discuss the next step in delivery of her medication.  Thanks [employer]

## 2017-11-25 NOTE — Telephone Encounter (Signed)
Ok, but please make sure her therapy is uninterrupted. It's very important to eradicate the virus.  Thanks Fleta Borgeson

## 2017-11-26 DIAGNOSIS — R110 Nausea: Secondary | ICD-10-CM | POA: Diagnosis not present

## 2017-11-26 DIAGNOSIS — D631 Anemia in chronic kidney disease: Secondary | ICD-10-CM | POA: Diagnosis not present

## 2017-11-26 DIAGNOSIS — N186 End stage renal disease: Secondary | ICD-10-CM | POA: Diagnosis not present

## 2017-11-26 DIAGNOSIS — N2581 Secondary hyperparathyroidism of renal origin: Secondary | ICD-10-CM | POA: Diagnosis not present

## 2017-11-26 DIAGNOSIS — Z992 Dependence on renal dialysis: Secondary | ICD-10-CM | POA: Diagnosis not present

## 2017-11-26 DIAGNOSIS — D509 Iron deficiency anemia, unspecified: Secondary | ICD-10-CM | POA: Diagnosis not present

## 2017-11-26 NOTE — Telephone Encounter (Signed)
Sounds good will do so.

## 2017-11-29 DIAGNOSIS — N2581 Secondary hyperparathyroidism of renal origin: Secondary | ICD-10-CM | POA: Diagnosis not present

## 2017-11-29 DIAGNOSIS — D631 Anemia in chronic kidney disease: Secondary | ICD-10-CM | POA: Diagnosis not present

## 2017-11-29 DIAGNOSIS — R110 Nausea: Secondary | ICD-10-CM | POA: Diagnosis not present

## 2017-11-29 DIAGNOSIS — Z992 Dependence on renal dialysis: Secondary | ICD-10-CM | POA: Diagnosis not present

## 2017-11-29 DIAGNOSIS — D509 Iron deficiency anemia, unspecified: Secondary | ICD-10-CM | POA: Diagnosis not present

## 2017-11-29 DIAGNOSIS — N186 End stage renal disease: Secondary | ICD-10-CM | POA: Diagnosis not present

## 2017-12-01 ENCOUNTER — Telehealth: Payer: Self-pay

## 2017-12-01 NOTE — Telephone Encounter (Signed)
Contacted pt to confirm she received her medication yesterday.  She was not home to receive the package.  [employer] left her a note to contact them for delivery.  I asked her to contact them ASAP and will schedule an appt for her to come in to discuss medication.    Thanks [employer]

## 2017-12-02 ENCOUNTER — Telehealth: Payer: Self-pay

## 2017-12-02 NOTE — Telephone Encounter (Signed)
Notified Lisabeth Pick that patient has received her medication.  She received it through [facility]. Not [employer] which had called for delivery date confirmation.  Apoligized for the miscommunication and informed Lisabeth Pick that patient will continue to receive medication from [facility].  Thanks [employer] [phone]

## 2017-12-02 NOTE — Telephone Encounter (Signed)
Patient will start [place] today. Informed her if she has any questions regarding the medication to contact our office.  Thanks [employer]

## 2017-12-03 DIAGNOSIS — Z992 Dependence on renal dialysis: Secondary | ICD-10-CM | POA: Diagnosis not present

## 2017-12-03 DIAGNOSIS — N186 End stage renal disease: Secondary | ICD-10-CM | POA: Diagnosis not present

## 2017-12-03 DIAGNOSIS — D509 Iron deficiency anemia, unspecified: Secondary | ICD-10-CM | POA: Diagnosis not present

## 2017-12-03 DIAGNOSIS — N2581 Secondary hyperparathyroidism of renal origin: Secondary | ICD-10-CM | POA: Diagnosis not present

## 2017-12-03 DIAGNOSIS — D631 Anemia in chronic kidney disease: Secondary | ICD-10-CM | POA: Diagnosis not present

## 2017-12-03 DIAGNOSIS — R110 Nausea: Secondary | ICD-10-CM | POA: Diagnosis not present

## 2017-12-06 DIAGNOSIS — D631 Anemia in chronic kidney disease: Secondary | ICD-10-CM | POA: Diagnosis not present

## 2017-12-06 DIAGNOSIS — N186 End stage renal disease: Secondary | ICD-10-CM | POA: Diagnosis not present

## 2017-12-06 DIAGNOSIS — R110 Nausea: Secondary | ICD-10-CM | POA: Diagnosis not present

## 2017-12-06 DIAGNOSIS — N2581 Secondary hyperparathyroidism of renal origin: Secondary | ICD-10-CM | POA: Diagnosis not present

## 2017-12-06 DIAGNOSIS — Z992 Dependence on renal dialysis: Secondary | ICD-10-CM | POA: Diagnosis not present

## 2017-12-06 DIAGNOSIS — D509 Iron deficiency anemia, unspecified: Secondary | ICD-10-CM | POA: Diagnosis not present

## 2017-12-08 DIAGNOSIS — D509 Iron deficiency anemia, unspecified: Secondary | ICD-10-CM | POA: Diagnosis not present

## 2017-12-08 DIAGNOSIS — Z992 Dependence on renal dialysis: Secondary | ICD-10-CM | POA: Diagnosis not present

## 2017-12-08 DIAGNOSIS — D631 Anemia in chronic kidney disease: Secondary | ICD-10-CM | POA: Diagnosis not present

## 2017-12-08 DIAGNOSIS — N186 End stage renal disease: Secondary | ICD-10-CM | POA: Diagnosis not present

## 2017-12-08 DIAGNOSIS — N2581 Secondary hyperparathyroidism of renal origin: Secondary | ICD-10-CM | POA: Diagnosis not present

## 2017-12-08 DIAGNOSIS — R110 Nausea: Secondary | ICD-10-CM | POA: Diagnosis not present

## 2017-12-08 LAB — CBC AND DIFFERENTIAL: HCT: 35 — AB (ref 36–46)

## 2017-12-08 LAB — BASIC METABOLIC PANEL
CREATININE: 6.7 — AB (ref 0.5–1.1)
SODIUM: 136 — AB (ref 137–147)

## 2017-12-09 DIAGNOSIS — M5412 Radiculopathy, cervical region: Secondary | ICD-10-CM | POA: Diagnosis not present

## 2017-12-09 DIAGNOSIS — M542 Cervicalgia: Secondary | ICD-10-CM | POA: Diagnosis not present

## 2017-12-10 DIAGNOSIS — Z992 Dependence on renal dialysis: Secondary | ICD-10-CM | POA: Diagnosis not present

## 2017-12-10 DIAGNOSIS — N186 End stage renal disease: Secondary | ICD-10-CM | POA: Diagnosis not present

## 2017-12-10 DIAGNOSIS — R110 Nausea: Secondary | ICD-10-CM | POA: Diagnosis not present

## 2017-12-10 DIAGNOSIS — N2581 Secondary hyperparathyroidism of renal origin: Secondary | ICD-10-CM | POA: Diagnosis not present

## 2017-12-10 DIAGNOSIS — D631 Anemia in chronic kidney disease: Secondary | ICD-10-CM | POA: Diagnosis not present

## 2017-12-10 DIAGNOSIS — D509 Iron deficiency anemia, unspecified: Secondary | ICD-10-CM | POA: Diagnosis not present

## 2017-12-12 DIAGNOSIS — Z992 Dependence on renal dialysis: Secondary | ICD-10-CM | POA: Diagnosis not present

## 2017-12-12 DIAGNOSIS — D509 Iron deficiency anemia, unspecified: Secondary | ICD-10-CM | POA: Diagnosis not present

## 2017-12-12 DIAGNOSIS — R110 Nausea: Secondary | ICD-10-CM | POA: Diagnosis not present

## 2017-12-12 DIAGNOSIS — N2581 Secondary hyperparathyroidism of renal origin: Secondary | ICD-10-CM | POA: Diagnosis not present

## 2017-12-12 DIAGNOSIS — D631 Anemia in chronic kidney disease: Secondary | ICD-10-CM | POA: Diagnosis not present

## 2017-12-12 DIAGNOSIS — N186 End stage renal disease: Secondary | ICD-10-CM | POA: Diagnosis not present

## 2017-12-14 ENCOUNTER — Other Ambulatory Visit: Payer: Self-pay

## 2017-12-14 ENCOUNTER — Emergency Department: Payer: Medicare Other

## 2017-12-14 ENCOUNTER — Inpatient Hospital Stay
Admission: EM | Admit: 2017-12-14 | Discharge: 2017-12-18 | DRG: 628 | Disposition: A | Discharge status: Home Health | Payer: Medicare Other | Attending: Internal Medicine | Admitting: Internal Medicine

## 2017-12-14 DIAGNOSIS — T82510A Breakdown (mechanical) of surgically created arteriovenous fistula, initial encounter: Secondary | ICD-10-CM | POA: Diagnosis present

## 2017-12-14 DIAGNOSIS — E1122 Type 2 diabetes mellitus with diabetic chronic kidney disease: Secondary | ICD-10-CM | POA: Diagnosis present

## 2017-12-14 DIAGNOSIS — Z794 Long term (current) use of insulin: Secondary | ICD-10-CM

## 2017-12-14 DIAGNOSIS — Z90721 Acquired absence of ovaries, unilateral: Secondary | ICD-10-CM

## 2017-12-14 DIAGNOSIS — I871 Compression of vein: Secondary | ICD-10-CM | POA: Diagnosis present

## 2017-12-14 DIAGNOSIS — Z9049 Acquired absence of other specified parts of digestive tract: Secondary | ICD-10-CM

## 2017-12-14 DIAGNOSIS — R109 Unspecified abdominal pain: Secondary | ICD-10-CM

## 2017-12-14 DIAGNOSIS — N2581 Secondary hyperparathyroidism of renal origin: Secondary | ICD-10-CM | POA: Diagnosis present

## 2017-12-14 DIAGNOSIS — Z8249 Family history of ischemic heart disease and other diseases of the circulatory system: Secondary | ICD-10-CM

## 2017-12-14 DIAGNOSIS — Z9102 Food additives allergy status: Secondary | ICD-10-CM

## 2017-12-14 DIAGNOSIS — N281 Cyst of kidney, acquired: Secondary | ICD-10-CM | POA: Diagnosis not present

## 2017-12-14 DIAGNOSIS — Z888 Allergy status to other drugs, medicaments and biological substances status: Secondary | ICD-10-CM

## 2017-12-14 DIAGNOSIS — Z992 Dependence on renal dialysis: Secondary | ICD-10-CM

## 2017-12-14 DIAGNOSIS — Z8701 Personal history of pneumonia (recurrent): Secondary | ICD-10-CM

## 2017-12-14 DIAGNOSIS — N186 End stage renal disease: Secondary | ICD-10-CM | POA: Diagnosis not present

## 2017-12-14 DIAGNOSIS — Y712 Prosthetic and other implants, materials and accessory cardiovascular devices associated with adverse incidents: Secondary | ICD-10-CM | POA: Diagnosis present

## 2017-12-14 DIAGNOSIS — L409 Psoriasis, unspecified: Secondary | ICD-10-CM | POA: Diagnosis present

## 2017-12-14 DIAGNOSIS — E1142 Type 2 diabetes mellitus with diabetic polyneuropathy: Secondary | ICD-10-CM | POA: Diagnosis present

## 2017-12-14 DIAGNOSIS — E876 Hypokalemia: Secondary | ICD-10-CM | POA: Diagnosis present

## 2017-12-14 DIAGNOSIS — G8929 Other chronic pain: Secondary | ICD-10-CM | POA: Diagnosis present

## 2017-12-14 DIAGNOSIS — Z881 Allergy status to other antibiotic agents status: Secondary | ICD-10-CM

## 2017-12-14 DIAGNOSIS — Z886 Allergy status to analgesic agent status: Secondary | ICD-10-CM

## 2017-12-14 DIAGNOSIS — I953 Hypotension of hemodialysis: Secondary | ICD-10-CM | POA: Diagnosis not present

## 2017-12-14 DIAGNOSIS — Z9851 Tubal ligation status: Secondary | ICD-10-CM

## 2017-12-14 DIAGNOSIS — I132 Hypertensive heart and chronic kidney disease with heart failure and with stage 5 chronic kidney disease, or end stage renal disease: Secondary | ICD-10-CM | POA: Diagnosis not present

## 2017-12-14 DIAGNOSIS — B1920 Unspecified viral hepatitis C without hepatic coma: Secondary | ICD-10-CM | POA: Diagnosis present

## 2017-12-14 DIAGNOSIS — M6281 Muscle weakness (generalized): Secondary | ICD-10-CM | POA: Diagnosis not present

## 2017-12-14 DIAGNOSIS — R600 Localized edema: Secondary | ICD-10-CM | POA: Diagnosis not present

## 2017-12-14 DIAGNOSIS — F17210 Nicotine dependence, cigarettes, uncomplicated: Secondary | ICD-10-CM | POA: Diagnosis present

## 2017-12-14 DIAGNOSIS — R609 Edema, unspecified: Secondary | ICD-10-CM

## 2017-12-14 DIAGNOSIS — Z79899 Other long term (current) drug therapy: Secondary | ICD-10-CM

## 2017-12-14 DIAGNOSIS — Z91018 Allergy to other foods: Secondary | ICD-10-CM

## 2017-12-14 DIAGNOSIS — J9600 Acute respiratory failure, unspecified whether with hypoxia or hypercapnia: Secondary | ICD-10-CM | POA: Diagnosis not present

## 2017-12-14 DIAGNOSIS — F329 Major depressive disorder, single episode, unspecified: Secondary | ICD-10-CM | POA: Diagnosis present

## 2017-12-14 DIAGNOSIS — R791 Abnormal coagulation profile: Secondary | ICD-10-CM | POA: Diagnosis present

## 2017-12-14 DIAGNOSIS — I5032 Chronic diastolic (congestive) heart failure: Secondary | ICD-10-CM | POA: Diagnosis not present

## 2017-12-14 DIAGNOSIS — E8770 Fluid overload, unspecified: Secondary | ICD-10-CM | POA: Diagnosis present

## 2017-12-14 DIAGNOSIS — D631 Anemia in chronic kidney disease: Secondary | ICD-10-CM | POA: Diagnosis present

## 2017-12-14 DIAGNOSIS — E875 Hyperkalemia: Principal | ICD-10-CM | POA: Diagnosis present

## 2017-12-14 DIAGNOSIS — I120 Hypertensive chronic kidney disease with stage 5 chronic kidney disease or end stage renal disease: Secondary | ICD-10-CM | POA: Diagnosis not present

## 2017-12-14 DIAGNOSIS — E7800 Pure hypercholesterolemia, unspecified: Secondary | ICD-10-CM | POA: Diagnosis present

## 2017-12-14 DIAGNOSIS — M1990 Unspecified osteoarthritis, unspecified site: Secondary | ICD-10-CM | POA: Diagnosis present

## 2017-12-14 DIAGNOSIS — E1151 Type 2 diabetes mellitus with diabetic peripheral angiopathy without gangrene: Secondary | ICD-10-CM | POA: Diagnosis present

## 2017-12-14 DIAGNOSIS — J439 Emphysema, unspecified: Secondary | ICD-10-CM | POA: Diagnosis present

## 2017-12-14 LAB — CBC
HCT: 38.5 % (ref 35.0–47.0)
Hemoglobin: 12.9 g/dL (ref 12.0–16.0)
MCH: 33.2 pg (ref 26.0–34.0)
MCHC: 33.6 g/dL (ref 32.0–36.0)
MCV: 98.9 fL (ref 80.0–100.0)
PLATELETS: 191 10*3/uL (ref 150–440)
RBC: 3.89 MIL/uL (ref 3.80–5.20)
RDW: 13.7 % (ref 11.5–14.5)
WBC: 8.5 10*3/uL (ref 3.6–11.0)

## 2017-12-14 NOTE — ED Provider Notes (Signed)
[facility] Emergency Department Provider Note  ____________________________________________   First MD Initiated Contact with Patient 12/14/17 2336     (approximate)  I have reviewed the triage vital signs and the nursing notes.   HISTORY  Chief Complaint Weakness    HPI [name] is a 51 y.o. female with extensive chronic medical issues including end-stage renal disease on hemodialysis Mondays, Wednesdays, and Fridays, who presents for evaluation of a variety of complaints.  Specifically she states that for several days she has had intermittent sharp and aching abdominal pain in the right side of her abdomen which nothing in particular makes it better nor worse.  She has had nausea but no vomiting.  She feels generally weak all over.  She has some chronic pain in her neck and arms which does not seem to be worse than normal.  She is mostly concerned about her abdomen.  She states that she last had dialysis about 3 days ago due to changes from the holiday schedule she does not feel particularly short of breath.  Overall however she describes her symptoms as severe and nothing worse.  Past Medical History:  Diagnosis Date  . Allergy   . Anemia   . Anxiety    worse when not at home - bowel incontinence  . Arthritis   . Ataxia   . CHF (congestive heart failure) (HCC)    history of, has been cleared.  . Chronic kidney disease   . COPD (chronic obstructive pulmonary disease) ([place])   . Depression   . Diabetes mellitus without complication ([place])   . Emphysema of lung ([place])   . End stage renal disease ([place])    MWF dialysis  . Fistula    left upper arm  . Headache    migraines  . Hepatitis 2003   Hep C  . Hiatal hernia   . Hypercholesterolemia    pt denies  . Hypertension   . Kidney disease   . Neuropathy, diabetic (HCC)    lower legs  . Peripheral vascular disease ([place])   . Pneumonia 2015  . Psoriasis   . Tobacco dependence   . Wears  dentures    full upper, partial lower    Patient Active Problem List   Diagnosis Date Noted  . Acute respiratory failure ([place]) 12/15/2017  . DDD (degenerative disc disease), cervical 11/23/2017  . Abdominal pain, chronic, right lower quadrant 09/14/2017  . Diarrhea 09/14/2017  . Incontinence of feces 09/14/2017  . Stool incontinence 08/19/2017  . Irritable bowel syndrome (IBS) 07/20/2017  . Chronic bilateral low back pain 07/20/2017  . Anxiety 07/02/2017  . Dialysis AV fistula malfunction ([place]) 05/19/2017  . Hyperkalemia 01/08/2017  . Generalized abdominal pain 12/31/2016  . Diabetes mellitus type 2, uncontrolled, with complications ([place]) [id number]/9381  . SMA stenosis ([place]) 12/18/2016  . Difficulty walking 11/26/2016  . Neuropathy due to type 2 diabetes mellitus ([place]) 11/26/2016  . Neck pain 11/26/2016  . Kidney transplant candidate 08/11/2016  . History of MRSA infection 07/18/2016  . Chronic ulcer of left foot ([place]) 07/18/2016  . Acquired palmar and plantar hyperkeratosis 07/18/2016  . Essential hypertension 07/18/2016  . Tobacco abuse 07/18/2016  . ESRD (end stage renal disease) on dialysis ([place]) 07/18/2016  . Adjustment disorder with mixed disturbance of emotions and conduct 05/11/2016  . Dysthymia 05/11/2016  . Diabetic macular edema ([place]) 08/24/2013  . Hypertensive retinopathy of both eyes 08/24/2013  . Non-proliferative diabetic retinopathy, severe, both eyes ([place]) 08/24/2013  .  Psoriatic arthropathy ([place]) 11/14/2012    Past Surgical History:  Procedure Laterality Date  . AV FISTULA PLACEMENT Left 11/2014  . CESAREAN SECTION    . CHOLECYSTECTOMY    . COLONOSCOPY N/A 09/22/2017   Procedure: COLONOSCOPY;  Surgeon: Lin Landsman, MD;  Location: [place];  Service: Endoscopy;  Laterality: N/A;  . cyst removed  from left hand Left 1989  . DIALYSIS/PERMA CATHETER INSERTION N/A 05/20/2017   Procedure: Dialysis/Perma Catheter Insertion and fistulagram/LUE  angiogram;  Surgeon: Algernon Huxley, MD;  Location: [place] [facility];  Service: Cardiovascular;  Laterality: N/A;  . ESOPHAGOGASTRODUODENOSCOPY N/A 09/22/2017   Procedure: ESOPHAGOGASTRODUODENOSCOPY (EGD);  Surgeon: Lin Landsman, MD;  Location: [place];  Service: Endoscopy;  Laterality: N/A;  . INCISION AND DRAINAGE ABSCESS N/A 07/16/2016   Procedure: INCISION AND DRAINAGE ABSCESS;  Surgeon: Carloyn Manner, MD;  Location: [facility];  Service: ENT;  Laterality: N/A;  . PERIPHERAL VASCULAR CATHETERIZATION N/A 07/25/2015   Procedure: A/V Shuntogram/Fistulagram;  Surgeon: Algernon Huxley, MD;  Location: [place] [facility];  Service: Cardiovascular;  Laterality: N/A;  . PERIPHERAL VASCULAR CATHETERIZATION Left 07/25/2015   Procedure: A/V Shunt Intervention;  Surgeon: Algernon Huxley, MD;  Location: [place] [facility];  Service: Cardiovascular;  Laterality: Left;  . PERIPHERAL VASCULAR CATHETERIZATION Left 10/07/2015   Procedure: A/V Shuntogram/Fistulagram;  Surgeon: Algernon Huxley, MD;  Location: [place] [facility];  Service: Cardiovascular;  Laterality: Left;  . PERIPHERAL VASCULAR CATHETERIZATION N/A 10/07/2015   Procedure: A/V Shunt Intervention;  Surgeon: Algernon Huxley, MD;  Location: [place] [facility];  Service: Cardiovascular;  Laterality: N/A;  . PERIPHERAL VASCULAR CATHETERIZATION  10/07/2015   Procedure: Dialysis/Perma Catheter Insertion;  Surgeon: Algernon Huxley, MD;  Location: [place] [facility];  Service: Cardiovascular;;  . PERIPHERAL VASCULAR CATHETERIZATION N/A 12/17/2015   Procedure: Dialysis/Perma Catheter Removal;  Surgeon: Katha Cabal, MD;  Location: [place] [facility];  Service: Cardiovascular;  Laterality: N/A;  . PERIPHERAL VASCULAR CATHETERIZATION N/A 01/11/2017   Procedure: Visceral Angiography;  Surgeon: Algernon Huxley, MD;  Location: [place] [facility];  Service: Cardiovascular;  Laterality: N/A;  . PERIPHERAL VASCULAR CATHETERIZATION N/A 01/11/2017    Procedure: Visceral Artery Intervention;  Surgeon: Algernon Huxley, MD;  Location: [place] [facility];  Service: Cardiovascular;  Laterality: N/A;  . POLYPECTOMY  09/22/2017   Procedure: POLYPECTOMY;  Surgeon: Lin Landsman, MD;  Location: [place];  Service: Endoscopy;;  . rt. tubal and ovary removed    . TONSILLECTOMY    . TUBAL LIGATION      Prior to Admission medications   Medication Sig Start Date End Date Taking? Authorizing Provider  b complex-vitamin c-folic acid (NEPHRO-VITE) 0.8 MG TABS tablet Take 1 tablet by mouth daily.    Yes [provider]  calcium acetate (PHOSLO) 667 MG capsule Take 1,334 mg by mouth 3 (three) times daily with meals.    Yes [provider]  diltiazem (TIAZAC) 120 MG 24 hr capsule Take 240 mg by mouth 2 (two) times daily.  11/22/14  Yes [provider]  Glecaprevir-Pibrentasvir (MAVYRET) 100-40 MG TABS Take 3 tablets by mouth daily. 11/23/17  Yes Vanga, Tally Due, MD  insulin glargine (LANTUS) 100 UNIT/ML injection Inject 15 Units into the skin daily.    Yes [provider]  insulin regular (NOVOLIN R,HUMULIN R) 100 units/mL injection Inject 0.02-0.04 mLs (2-4 Units total) into the skin 3 (three) times daily with meals.  Patient taking differently: Inject 3-5 Units into the skin 3 (three) times daily with meals.  08/19/17  Yes Mikey College, NP  midodrine (PROAMATINE) 5 MG tablet Take 5 mg by mouth as needed (when blood pressure is low).   Yes [provider]  oxyCODONE (OXY IR/ROXICODONE) 5 MG immediate release tablet Take 1 tablet (5 mg total) by mouth 3 (three) times daily as needed for severe pain. For chronic pain To fill on or after: 11/17/17, 12/16/17 To last 30 days from fill date 11/17/17  Yes Gillis Santa, MD  promethazine (PHENERGAN) 25 MG tablet Take 1 tablet (25 mg total) by mouth every 4 (four) hours as needed for nausea or vomiting. 10/19/16  Yes [name], MD    sevelamer carbonate (RENVELA) 800 MG tablet Take 1,600 mg by mouth 3 (three) times daily with meals.   Yes [provider]  clobetasol ointment (TEMOVATE) 5.28 % Apply 1 application topically daily as needed (for psorasis).     [provider]  gabapentin (NEURONTIN) 100 MG capsule Take 1 capsule (100 mg total) by mouth 3 (three) times daily. OR 3 capsules at bedtime. Patient not taking: Reported on 11/17/2017 08/19/17   Mikey College, NP  glucose 4 GM chewable tablet Chew 1 tablet (4 g total) by mouth as needed for low blood sugar. 08/19/17   Mikey College, NP  loperamide (IMODIUM A-D) 2 MG tablet Take 2 mg by mouth 3 (three) times daily as needed.     [provider]    Allergies Cinnamon; Garlic; Onion; Tylenol [acetaminophen]; Ciprofloxacin; and Prednisone  Family History  Problem Relation Age of Onset  . Hypertension Mother   . Heart disease Mother   . Hypertension Father   . Skin cancer Father     Social History Social History   Tobacco Use  . Smoking status: Light Tobacco Smoker    Packs/day: 0.25    Years: 20.00    Pack years: 5.00    Types: Cigarettes    Last attempt to quit: 05/21/2017    Years since quitting: 0.5  . Smokeless tobacco: Never Used  Substance Use Topics  . Alcohol use: No  . Drug use: No    Review of Systems Constitutional: No fever/chills.  General malaise and generalized weakness Cardiovascular: Denies chest pain. Respiratory: Denies shortness of breath. Gastrointestinal: +abdominal pain.  Nausea, no vomiting.  Some constipation recently Genitourinary: Negative for dysuria. Musculoskeletal: Negative for neck pain.  Negative for back pain. Integumentary: Negative for rash. Neurological: Some chronic pain in her neck and arms.  No focal weakness nor numbness.   ____________________________________________   PHYSICAL EXAM:  VITAL SIGNS: ED Triage Vitals  Enc Vitals Group     BP 12/14/17 2320 (!)  152/86     Pulse Rate 12/14/17 2320 86     Resp 12/14/17 2320 20     Temp 12/14/17 2320 98 F (36.7 C)     Temp Source 12/14/17 2320 Oral     SpO2 12/14/17 2320 93 %     Weight 12/14/17 2318 96.2 kg (212 lb)     Height 12/14/17 2318 1.702 m (5\' 7" )     Head Circumference --      Peak Flow --      Pain Score 12/14/17 2317 8     Pain Loc --      Pain Edu? --      Excl. in [place]? --     Constitutional: Alert and oriented.  Generally well appearing and in no acute distress. Eyes: Conjunctivae are normal.  Head: Atraumatic. Nose: No congestion/rhinnorhea. Mouth/Throat: Mucous membranes are moist. Neck: No stridor.  No meningeal signs.   Cardiovascular: Normal rate, regular rhythm. Good peripheral circulation. Grossly normal heart sounds. Respiratory: Normal respiratory effort.  No retractions. Lungs CTAB. Gastrointestinal: Soft with minimal diffuse tenderness throughout the abdomen, no distention, no peritonitis Musculoskeletal: No lower extremity tenderness nor edema. No gross deformities of extremities. Neurologic:  Normal speech and language. No gross focal neurologic deficits are appreciated.  Skin:  Skin is warm, dry and intact. No rash noted. Psychiatric: Mood and affect are normal. Speech and behavior are normal.  ____________________________________________   LABS (all labs ordered are listed, but only abnormal results are displayed)  Labs Reviewed  COMPREHENSIVE METABOLIC PANEL - Abnormal; Notable for the following components:      Result Value   Sodium 131 (*)    Potassium 7.1 (*)    Chloride 98 (*)    CO2 20 (*)    Glucose, Bld 183 (*)    BUN 76 (*)    Creatinine, Ser 6.63 (*)    Calcium 8.3 (*)    Total Protein 9.0 (*)    GFR calc non Af Amer 6 (*)    GFR calc Af Amer 7 (*)    All other components within normal limits  GLUCOSE, CAPILLARY - Abnormal; Notable for the following components:   Glucose-Capillary 142 (*)    All other components within normal limits    CBC  LIPASE, BLOOD  TROPONIN I  MAGNESIUM  URINALYSIS, COMPLETE (UACMP) WITH MICROSCOPIC  BASIC METABOLIC PANEL  POTASSIUM  POTASSIUM  POTASSIUM  POTASSIUM  SODIUM, URINE, RANDOM  BASIC METABOLIC PANEL  POTASSIUM  POTASSIUM  POTASSIUM  POTASSIUM  POTASSIUM  SODIUM, URINE, RANDOM  CBC  HIV ANTIBODY (ROUTINE TESTING)  BASIC METABOLIC PANEL   ____________________________________________  EKG  ED ECG REPORT I, Hinda Kehr, the attending physician, personally viewed and interpreted this ECG.  Date: 12/14/2017 EKG Time: 23: 20 Rate: 86 Rhythm: normal sinus rhythm QRS Axis: normal Intervals: normal ST/T Wave abnormalities: Peaked T-waves most notable in V2-V3, also sharp but lower amplitude in leads V4-V6 Narrative Interpretation: no evidence of acute ischemia   ____________________________________________  RADIOLOGY   Ct Abdomen Pelvis Wo Contrast  Result Date: 12/15/2017 CLINICAL DATA:  51 y/o F; generalized weakness and right-sided abdominal pain. History renal failure and hepatitis-C. EXAM: CT ABDOMEN AND PELVIS WITHOUT CONTRAST TECHNIQUE: Multidetector CT imaging of the abdomen and pelvis was performed following the standard protocol without IV contrast. COMPARISON:  11/20/2016 CT abdomen and pelvis. FINDINGS: Lower chest: Septal thickening at the lung bases probably representing interstitial edema. Small left pleural effusion. Hepatobiliary: No focal liver abnormality is seen. Status post cholecystectomy. No biliary dilatation. Stable nonspecific perihepatic calcifications. Pancreas: Unremarkable. No pancreatic ductal dilatation or surrounding inflammatory changes. Spleen: Normal in size without focal abnormality. Adrenals/Urinary Tract: Normal adrenal glands. Stable 27 mm right interpolar kidney cyst. No other focal kidney lesion. No hydronephrosis. Normal bladder. Stomach/Bowel: Stomach is within normal limits. Appendix appears normal. No evidence of bowel wall  thickening, distention, or inflammatory changes. Scattered sigmoid diverticulosis. Vascular/Lymphatic: Aortic atherosclerosis. No enlarged abdominal or pelvic lymph nodes. Reproductive: Uterus and bilateral adnexa are unremarkable. Other: No abdominal wall hernia or abnormality. No abdominopelvic ascites. Musculoskeletal: No acute or significant osseous findings. IMPRESSION: 1. No acute abdominal or pelvic process identified. 2. Interstitial pulmonary edema and small left pleural effusion. 3. Stable right  kidney interpolar cyst. 4. Scattered sigmoid diverticulosis. 5. Aortic atherosclerosis. Electronically Signed   By: Kristine Garbe M.D.   On: 12/15/2017 00:27    ____________________________________________   PROCEDURES  Critical Care performed: Yes, see critical care procedure note(s)   Procedure(s) performed:   .Critical Care Performed by: Hinda Kehr, MD Authorized by: Hinda Kehr, MD   Critical care provider statement:    Critical care time (minutes):  45   Critical care time was exclusive of:  Separately billable procedures and treating other patients   Critical care was necessary to treat or prevent imminent or life-threatening deterioration of the following conditions:  Metabolic crisis and renal failure   Critical care was time spent personally by me on the following activities:  Development of treatment plan with patient or surrogate, discussions with consultants, evaluation of patient's response to treatment, examination of patient, obtaining history from patient or surrogate, ordering and performing treatments and interventions, ordering and review of laboratory studies, ordering and review of radiographic studies, pulse oximetry, re-evaluation of patient's condition and review of old charts      ____________________________________________   INITIAL IMPRESSION / ASSESSMENT AND PLAN / ED COURSE  As part of my medical decision making, I reviewed the following  data within the [facility] notes reviewed and incorporated, Labs reviewed , EKG interpreted , Old EKG reviewed, Old chart reviewed, Discussed with admitting physician (Dr. Manuella Ghazi) and A phone consult was requested and obtained from this/these consultant(s) (nephrology, Dr. Juleen China)    Differential diagnosis includes, but is not limited to, ovarian cyst, ovarian torsion, acute appendicitis, diverticulitis, urinary tract infection/pyelonephritis, endometriosis, bowel obstruction, colitis, renal colic, gastroenteritis, hernia, fibroids, endometriosis, pregnancy related pain including ectopic pregnancy, etc. the patient is reporting some constipation and nausea and I would like to rule out ileus/obstruction as well as any acute intra-abdominal infection but I think this is a low probability.  However given her extensive medical history I think it is worth evaluation.  Lab work is pending; EKG does show peaked T waves so potassium is a concern especially because her last dialysis session was 3 days ago.  I will continue to monitor but the patient does not seem to be in any acute distress at this time.    Clinical Course as of Dec 15 328  Wed Dec 15, 2017  0017 Elevated potassium which is consistent with EKG findings.  I will treat medically for hyperkalemia but continue to obtain the CT scan of her abdomen given the ongoing complaints of abdominal pain.  I have paged Dr. Juleen China with the nephrology service and anticipate admission so that the patient can obtain urgent dialysis for definitive treatment of the hyperkalemia. Potassium: (!!) 7.1 [CF]  0021 Ordered insulin/glucose, albuterol, calcium chloride.  Repaging Dr. Juleen China.  Will discuss with admitting doctor as soon as nephrology is consulted by phone.  [CF]  0028 CT done, awaiting results, but low suspicion of acute intraabdominal pathology  [CF]  0030 Interstitial edema and small pleural effusion, but no acute intraabdominal  findings.  Proceeding with plan. CT Abdomen Pelvis Wo Contrast [CF]  0045 Magnesium: 2.4 [CF]  0054 I spoke in person with Dr. Manuella Ghazi the hospitalist who will admit  [CF]  0151 Spoke with Dr. [name], discussed case, continuing with plan  [CF]    Clinical Course User Index [CF] Hinda Kehr, MD    ____________________________________________  FINAL CLINICAL IMPRESSION(S) / ED DIAGNOSES  Final diagnoses:  Hyperkalemia  Edema, unspecified type  Right  sided abdominal pain  ESRD on hemodialysis ([place])     MEDICATIONS GIVEN DURING THIS VISIT:  Medications  promethazine (PHENERGAN) tablet 25 mg (not administered)  calcium acetate (PHOSLO) capsule 1,334 mg (not administered)  sevelamer carbonate (RENVELA) tablet 1,600 mg (not administered)  midodrine (PROAMATINE) tablet 5 mg (not administered)  insulin glargine (LANTUS) injection 15 Units (not administered)  glucose chewable tablet 4 g (not administered)  diltiazem (CARDIZEM CD) 24 hr capsule 240 mg (240 mg Oral Not Given 12/15/17 0311)  Glecaprevir-Pibrentasvir 100-40 MG TABS 3 tablet (not administered)  heparin injection 5,000 Units (not administered)  traZODone (DESYREL) tablet 25 mg (not administered)  docusate sodium (COLACE) capsule 100 mg (100 mg Oral Not Given 12/15/17 0313)  bisacodyl (DULCOLAX) EC tablet 5 mg (not administered)  ondansetron (ZOFRAN) tablet 4 mg (not administered)    Or  ondansetron (ZOFRAN) injection 4 mg (not administered)  sodium chloride flush (NS) 0.9 % injection 3 mL (3 mLs Intravenous Given 12/15/17 0316)  sodium chloride flush (NS) 0.9 % injection 3 mL (not administered)  0.9 %  sodium chloride infusion (not administered)  calcium chloride 1 g in sodium chloride 0.9 % 100 mL IVPB (0 g Intravenous Stopped 12/15/17 0220)  insulin aspart (novoLOG) injection 5 Units (5 Units Intravenous Given 12/15/17 0304)  dextrose 50 % solution 50 mL (50 mLs Intravenous Given 12/15/17 0304)  sodium bicarbonate  injection 50 mEq (50 mEq Intravenous Given 12/15/17 0232)  diphenhydrAMINE (BENADRYL) 12.5 MG/5ML elixir 12.5 mg (12.5 mg Oral Given 12/15/17 0128)  albuterol (PROVENTIL) (2.5 MG/3ML) 0.083% nebulizer solution 5 mg (5 mg Nebulization Given 12/15/17 0151)  sodium polystyrene (KAYEXALATE) 15 GM/60ML suspension 30 g (30 g Oral Given 12/15/17 0309)     ED Discharge Orders    None       Note:  This document was prepared using Dragon voice recognition software and may include unintentional dictation errors.    Hinda Kehr, MD 12/15/17 [phone]

## 2017-12-14 NOTE — ED Triage Notes (Addendum)
Pt in with generalized weakness all over since today, pt co right sided abd pain. Has hx of hep c and renal failure. FSBS per EMS 211 had dialysis on Sunday.

## 2017-12-15 DIAGNOSIS — T82868A Thrombosis of vascular prosthetic devices, implants and grafts, initial encounter: Secondary | ICD-10-CM | POA: Diagnosis not present

## 2017-12-15 DIAGNOSIS — E8770 Fluid overload, unspecified: Secondary | ICD-10-CM | POA: Diagnosis present

## 2017-12-15 DIAGNOSIS — E1151 Type 2 diabetes mellitus with diabetic peripheral angiopathy without gangrene: Secondary | ICD-10-CM | POA: Diagnosis present

## 2017-12-15 DIAGNOSIS — D631 Anemia in chronic kidney disease: Secondary | ICD-10-CM | POA: Diagnosis not present

## 2017-12-15 DIAGNOSIS — I10 Essential (primary) hypertension: Secondary | ICD-10-CM | POA: Diagnosis not present

## 2017-12-15 DIAGNOSIS — Z992 Dependence on renal dialysis: Secondary | ICD-10-CM | POA: Diagnosis not present

## 2017-12-15 DIAGNOSIS — R109 Unspecified abdominal pain: Secondary | ICD-10-CM | POA: Diagnosis not present

## 2017-12-15 DIAGNOSIS — G8929 Other chronic pain: Secondary | ICD-10-CM | POA: Diagnosis present

## 2017-12-15 DIAGNOSIS — I959 Hypotension, unspecified: Secondary | ICD-10-CM | POA: Diagnosis not present

## 2017-12-15 DIAGNOSIS — N281 Cyst of kidney, acquired: Secondary | ICD-10-CM | POA: Diagnosis not present

## 2017-12-15 DIAGNOSIS — E1142 Type 2 diabetes mellitus with diabetic polyneuropathy: Secondary | ICD-10-CM | POA: Diagnosis present

## 2017-12-15 DIAGNOSIS — E876 Hypokalemia: Secondary | ICD-10-CM | POA: Diagnosis present

## 2017-12-15 DIAGNOSIS — B1920 Unspecified viral hepatitis C without hepatic coma: Secondary | ICD-10-CM | POA: Diagnosis present

## 2017-12-15 DIAGNOSIS — M1990 Unspecified osteoarthritis, unspecified site: Secondary | ICD-10-CM | POA: Diagnosis present

## 2017-12-15 DIAGNOSIS — I5032 Chronic diastolic (congestive) heart failure: Secondary | ICD-10-CM | POA: Diagnosis present

## 2017-12-15 DIAGNOSIS — N186 End stage renal disease: Secondary | ICD-10-CM | POA: Diagnosis not present

## 2017-12-15 DIAGNOSIS — N2581 Secondary hyperparathyroidism of renal origin: Secondary | ICD-10-CM | POA: Diagnosis not present

## 2017-12-15 DIAGNOSIS — M792 Neuralgia and neuritis, unspecified: Secondary | ICD-10-CM | POA: Diagnosis not present

## 2017-12-15 DIAGNOSIS — F329 Major depressive disorder, single episode, unspecified: Secondary | ICD-10-CM | POA: Diagnosis present

## 2017-12-15 DIAGNOSIS — I871 Compression of vein: Secondary | ICD-10-CM | POA: Diagnosis present

## 2017-12-15 DIAGNOSIS — E7800 Pure hypercholesterolemia, unspecified: Secondary | ICD-10-CM | POA: Diagnosis present

## 2017-12-15 DIAGNOSIS — R791 Abnormal coagulation profile: Secondary | ICD-10-CM | POA: Diagnosis present

## 2017-12-15 DIAGNOSIS — J439 Emphysema, unspecified: Secondary | ICD-10-CM | POA: Diagnosis present

## 2017-12-15 DIAGNOSIS — R600 Localized edema: Secondary | ICD-10-CM | POA: Diagnosis not present

## 2017-12-15 DIAGNOSIS — E1122 Type 2 diabetes mellitus with diabetic chronic kidney disease: Secondary | ICD-10-CM | POA: Diagnosis present

## 2017-12-15 DIAGNOSIS — L409 Psoriasis, unspecified: Secondary | ICD-10-CM | POA: Diagnosis present

## 2017-12-15 DIAGNOSIS — E875 Hyperkalemia: Secondary | ICD-10-CM | POA: Diagnosis not present

## 2017-12-15 DIAGNOSIS — I132 Hypertensive heart and chronic kidney disease with heart failure and with stage 5 chronic kidney disease, or end stage renal disease: Secondary | ICD-10-CM | POA: Diagnosis present

## 2017-12-15 DIAGNOSIS — Y712 Prosthetic and other implants, materials and accessory cardiovascular devices associated with adverse incidents: Secondary | ICD-10-CM | POA: Diagnosis present

## 2017-12-15 DIAGNOSIS — T82510A Breakdown (mechanical) of surgically created arteriovenous fistula, initial encounter: Secondary | ICD-10-CM | POA: Diagnosis present

## 2017-12-15 DIAGNOSIS — J9600 Acute respiratory failure, unspecified whether with hypoxia or hypercapnia: Secondary | ICD-10-CM | POA: Diagnosis present

## 2017-12-15 DIAGNOSIS — J9601 Acute respiratory failure with hypoxia: Secondary | ICD-10-CM

## 2017-12-15 DIAGNOSIS — E119 Type 2 diabetes mellitus without complications: Secondary | ICD-10-CM | POA: Diagnosis not present

## 2017-12-15 DIAGNOSIS — I953 Hypotension of hemodialysis: Secondary | ICD-10-CM | POA: Diagnosis not present

## 2017-12-15 LAB — COMPREHENSIVE METABOLIC PANEL
ALT: 16 U/L (ref 14–54)
AST: 23 U/L (ref 15–41)
Albumin: 3.7 g/dL (ref 3.5–5.0)
Alkaline Phosphatase: 60 U/L (ref 38–126)
Anion gap: 13 (ref 5–15)
BUN: 76 mg/dL — AB (ref 6–20)
CHLORIDE: 98 mmol/L — AB (ref 101–111)
CO2: 20 mmol/L — AB (ref 22–32)
CREATININE: 6.63 mg/dL — AB (ref 0.44–1.00)
Calcium: 8.3 mg/dL — ABNORMAL LOW (ref 8.9–10.3)
GFR calc Af Amer: 7 mL/min — ABNORMAL LOW (ref >60)
GFR, EST NON AFRICAN AMERICAN: 6 mL/min — AB (ref >60)
GLUCOSE: 183 mg/dL — AB (ref 65–99)
Potassium: 7.1 mmol/L (ref 3.5–5.1)
Sodium: 131 mmol/L — ABNORMAL LOW (ref 135–145)
Total Bilirubin: 0.9 mg/dL (ref 0.3–1.2)
Total Protein: 9 g/dL — ABNORMAL HIGH (ref 6.5–8.1)

## 2017-12-15 LAB — BASIC METABOLIC PANEL
ANION GAP: 15 (ref 5–15)
BUN: 75 mg/dL — ABNORMAL HIGH (ref 6–20)
CHLORIDE: 98 mmol/L — AB (ref 101–111)
CO2: 21 mmol/L — ABNORMAL LOW (ref 22–32)
Calcium: 8.8 mg/dL — ABNORMAL LOW (ref 8.9–10.3)
Creatinine, Ser: 7.05 mg/dL — ABNORMAL HIGH (ref 0.44–1.00)
GFR, EST AFRICAN AMERICAN: 7 mL/min — AB (ref >60)
GFR, EST NON AFRICAN AMERICAN: 6 mL/min — AB (ref >60)
Glucose, Bld: 213 mg/dL — ABNORMAL HIGH (ref 65–99)
POTASSIUM: 5.6 mmol/L — AB (ref 3.5–5.1)
SODIUM: 134 mmol/L — AB (ref 135–145)

## 2017-12-15 LAB — MRSA PCR SCREENING: MRSA BY PCR: NEGATIVE

## 2017-12-15 LAB — LIPASE, BLOOD: LIPASE: 40 U/L (ref 11–51)

## 2017-12-15 LAB — GLUCOSE, CAPILLARY
GLUCOSE-CAPILLARY: 142 mg/dL — AB (ref 65–99)
GLUCOSE-CAPILLARY: 162 mg/dL — AB (ref 65–99)
Glucose-Capillary: 159 mg/dL — ABNORMAL HIGH (ref 65–99)
Glucose-Capillary: 169 mg/dL — ABNORMAL HIGH (ref 65–99)

## 2017-12-15 LAB — TROPONIN I: Troponin I: <0.03 ng/mL (ref <0.03)

## 2017-12-15 LAB — POTASSIUM
POTASSIUM: 5.7 mmol/L — AB (ref 3.5–5.1)
POTASSIUM: 3.7 mmol/L (ref 3.5–5.1)
POTASSIUM: 3.5 mmol/L (ref 3.5–5.1)

## 2017-12-15 LAB — CBC
HEMATOCRIT: 36 % (ref 35.0–47.0)
Hemoglobin: 12.3 g/dL (ref 12.0–16.0)
MCH: 33.5 pg (ref 26.0–34.0)
MCHC: 34.1 g/dL (ref 32.0–36.0)
MCV: 98.3 fL (ref 80.0–100.0)
PLATELETS: 162 10*3/uL (ref 150–440)
RBC: 3.66 MIL/uL — ABNORMAL LOW (ref 3.80–5.20)
RDW: 13.6 % (ref 11.5–14.5)
WBC: 6.5 10*3/uL (ref 3.6–11.0)

## 2017-12-15 LAB — MAGNESIUM: Magnesium: 2.4 mg/dL (ref 1.7–2.4)

## 2017-12-15 MED ORDER — SODIUM BICARBONATE 8.4 % IV SOLN
50.0000 meq | Freq: Once | INTRAVENOUS | Status: AC
  Administered 2017-12-15: 50 meq via INTRAVENOUS
  Filled 2017-12-15: qty 50

## 2017-12-15 MED ORDER — SODIUM CHLORIDE 0.9% FLUSH
3.0000 mL | Freq: Two times a day (BID) | INTRAVENOUS | Status: DC
  Administered 2017-12-15 – 2017-12-18 (×8): 3 mL via INTRAVENOUS

## 2017-12-15 MED ORDER — OXYCODONE HCL 5 MG PO TABS
5.0000 mg | ORAL_TABLET | Freq: Three times a day (TID) | ORAL | Status: DC | PRN
  Administered 2017-12-15 – 2017-12-18 (×4): 5 mg via ORAL
  Filled 2017-12-15 (×5): qty 1

## 2017-12-15 MED ORDER — GLECAPREVIR-PIBRENTASVIR 100-40 MG PO TABS: 3.0000 | ORAL_TABLET | Freq: Every day | ORAL | Status: DC

## 2017-12-15 MED ORDER — TRAZODONE HCL 50 MG PO TABS
25.0000 mg | ORAL_TABLET | Freq: Every evening | ORAL | Status: DC | PRN
  Filled 2017-12-15: qty 1

## 2017-12-15 MED ORDER — CALCIUM ACETATE (PHOS BINDER) 667 MG PO CAPS
1334.0000 mg | ORAL_CAPSULE | Freq: Three times a day (TID) | ORAL | Status: DC
  Administered 2017-12-15 – 2017-12-18 (×9): 1334 mg via ORAL
  Filled 2017-12-15 (×10): qty 2

## 2017-12-15 MED ORDER — SODIUM POLYSTYRENE SULFONATE 15 GM/60ML PO SUSP
30.0000 g | Freq: Once | ORAL | Status: AC
  Administered 2017-12-15: 30 g via ORAL
  Filled 2017-12-15: qty 120

## 2017-12-15 MED ORDER — SODIUM CHLORIDE 0.9 % IV SOLN
1.0000 g | Freq: Once | INTRAVENOUS | Status: AC
  Administered 2017-12-15: 1 g via INTRAVENOUS
  Filled 2017-12-15: qty 10

## 2017-12-15 MED ORDER — SODIUM CHLORIDE 0.9% FLUSH: 3.0000 mL | INTRAVENOUS | Status: DC | PRN

## 2017-12-15 MED ORDER — ONDANSETRON HCL 4 MG PO TABS: 4.0000 mg | ORAL_TABLET | Freq: Four times a day (QID) | ORAL | Status: DC | PRN

## 2017-12-15 MED ORDER — DIPHENHYDRAMINE HCL 50 MG/ML IJ SOLN
25.0000 mg | Freq: Once | INTRAMUSCULAR | Status: AC
  Administered 2017-12-15: 25 mg via INTRAVENOUS
  Filled 2017-12-15: qty 1

## 2017-12-15 MED ORDER — SEVELAMER CARBONATE 800 MG PO TABS
1600.0000 mg | ORAL_TABLET | Freq: Three times a day (TID) | ORAL | Status: DC
Start: 2017-12-15 — End: 2017-12-19
  Administered 2017-12-15 – 2017-12-18 (×9): 1600 mg via ORAL
  Filled 2017-12-15 (×9): qty 2

## 2017-12-15 MED ORDER — DIPHENHYDRAMINE HCL 25 MG PO CAPS
25.0000 mg | ORAL_CAPSULE | ORAL | Status: DC | PRN
  Filled 2017-12-15: qty 1

## 2017-12-15 MED ORDER — HEPARIN SODIUM (PORCINE) 5000 UNIT/ML IJ SOLN
5000.0000 [IU] | Freq: Three times a day (TID) | INTRAMUSCULAR | Status: DC
  Filled 2017-12-15 (×4): qty 1

## 2017-12-15 MED ORDER — DIPHENHYDRAMINE HCL 12.5 MG/5ML PO ELIX
12.5000 mg | ORAL_SOLUTION | Freq: Once | ORAL | Status: AC
  Administered 2017-12-15: 12.5 mg via ORAL
  Filled 2017-12-15: qty 5

## 2017-12-15 MED ORDER — SODIUM CHLORIDE 0.9 % IV SOLN: 250.0000 mL | INTRAVENOUS | Status: DC | PRN

## 2017-12-15 MED ORDER — ONDANSETRON HCL 4 MG/2ML IJ SOLN: 4.0000 mg | Freq: Four times a day (QID) | INTRAMUSCULAR | Status: DC | PRN

## 2017-12-15 MED ORDER — DILTIAZEM HCL ER COATED BEADS 240 MG PO CP24
240.0000 mg | ORAL_CAPSULE | Freq: Two times a day (BID) | ORAL | Status: DC
  Administered 2017-12-15 – 2017-12-18 (×7): 240 mg via ORAL
  Filled 2017-12-15: qty 2
  Filled 2017-12-15: qty 1
  Filled 2017-12-15 (×3): qty 2
  Filled 2017-12-15: qty 1
  Filled 2017-12-15: qty 2
  Filled 2017-12-15 (×2): qty 1
  Filled 2017-12-15: qty 2
  Filled 2017-12-15 (×4): qty 1
  Filled 2017-12-15: qty 2
  Filled 2017-12-15: qty 1

## 2017-12-15 MED ORDER — BISACODYL 5 MG PO TBEC: 5.0000 mg | DELAYED_RELEASE_TABLET | Freq: Every day | ORAL | Status: DC | PRN

## 2017-12-15 MED ORDER — INSULIN ASPART 100 UNIT/ML ~~LOC~~ SOLN
0.0000 [IU] | Freq: Three times a day (TID) | SUBCUTANEOUS | Status: DC
  Administered 2017-12-15: 3 [IU] via SUBCUTANEOUS
  Filled 2017-12-15: qty 1

## 2017-12-15 MED ORDER — ALBUTEROL SULFATE (2.5 MG/3ML) 0.083% IN NEBU
5.0000 mg | INHALATION_SOLUTION | Freq: Once | RESPIRATORY_TRACT | Status: AC
  Administered 2017-12-15: 5 mg via RESPIRATORY_TRACT

## 2017-12-15 MED ORDER — PROMETHAZINE HCL 25 MG PO TABS: 25.0000 mg | ORAL_TABLET | ORAL | Status: DC | PRN

## 2017-12-15 MED ORDER — INSULIN ASPART 100 UNIT/ML ~~LOC~~ SOLN
SUBCUTANEOUS | Status: AC
  Administered 2017-12-15: 5 [IU] via INTRAVENOUS
  Filled 2017-12-15: qty 1

## 2017-12-15 MED ORDER — DOCUSATE SODIUM 100 MG PO CAPS
100.0000 mg | ORAL_CAPSULE | Freq: Two times a day (BID) | ORAL | Status: DC
  Administered 2017-12-17 – 2017-12-18 (×2): 100 mg via ORAL
  Filled 2017-12-15 (×6): qty 1

## 2017-12-15 MED ORDER — RENA-VITE PO TABS
1.0000 | ORAL_TABLET | Freq: Every day | ORAL | Status: DC
  Administered 2017-12-15 – 2017-12-17 (×3): 1 via ORAL
  Filled 2017-12-15 (×3): qty 1

## 2017-12-15 MED ORDER — ALBUTEROL SULFATE (2.5 MG/3ML) 0.083% IN NEBU
10.0000 mg | INHALATION_SOLUTION | Freq: Once | RESPIRATORY_TRACT | Status: DC
  Filled 2017-12-15: qty 12

## 2017-12-15 MED ORDER — GLUCOSE 4 G PO CHEW
1.0000 | CHEWABLE_TABLET | ORAL | Status: DC | PRN
  Administered 2017-12-16: 12 g via ORAL
  Filled 2017-12-15: qty 1

## 2017-12-15 MED ORDER — DEXTROSE 50 % IV SOLN
1.0000 | Freq: Once | INTRAVENOUS | Status: AC
  Administered 2017-12-15: 50 mL via INTRAVENOUS
  Filled 2017-12-15: qty 50

## 2017-12-15 MED ORDER — INSULIN GLARGINE 100 UNIT/ML ~~LOC~~ SOLN
15.0000 [IU] | Freq: Every day | SUBCUTANEOUS | Status: DC
  Administered 2017-12-15 – 2017-12-18 (×4): 15 [IU] via SUBCUTANEOUS
  Filled 2017-12-15 (×4): qty 0.15

## 2017-12-15 MED ORDER — INSULIN ASPART 100 UNIT/ML IV SOLN
5.0000 [IU] | Freq: Once | INTRAVENOUS | Status: AC
  Administered 2017-12-15: 5 [IU] via INTRAVENOUS
  Filled 2017-12-15: qty 0.05

## 2017-12-15 MED ORDER — MIDODRINE HCL 5 MG PO TABS
5.0000 mg | ORAL_TABLET | ORAL | Status: DC | PRN
  Filled 2017-12-15: qty 1

## 2017-12-15 NOTE — H&P (Addendum)
[place] [facility] [place] NAME: Khandi Kernes    MR#:  [id number]  DATE OF BIRTH:  [birth year]  DATE OF ADMISSION:  12/14/2017  PRIMARY CARE PHYSICIAN: Mikey College, NP   REQUESTING/REFERRING PHYSICIAN: Hinda Kehr, MD  CHIEF COMPLAINT:   Chief Complaint  Patient presents with  . Weakness    HISTORY OF PRESENT ILLNESS:  Marrie Chandra  is a 51 y.o. female with a known history of end-stage renal disease on hemodialysis Mondays, Wednesdays, and Fridays, who presents for evaluation of a variety of complaints.  Specifically she states that for several days she has had intermittent sharp and aching abdominal pain in the right side of her abdomen which nothing in particular makes it better nor worse.  She has had nausea but no vomiting. She is also feeling palpitations and leg heaviness. PAST MEDICAL HISTORY:   Past Medical History:  Diagnosis Date  . Allergy   . Anemia   . Anxiety    worse when not at home - bowel incontinence  . Arthritis   . Ataxia   . CHF (congestive heart failure) (HCC)    history of, has been cleared.  . Chronic kidney disease   . COPD (chronic obstructive pulmonary disease) ([place])   . Depression   . Diabetes mellitus without complication ([place])   . Emphysema of lung ([place])   . End stage renal disease ([place])    MWF dialysis  . Fistula    left upper arm  . Headache    migraines  . Hepatitis 2003   Hep C  . Hiatal hernia   . Hypercholesterolemia    pt denies  . Hypertension   . Kidney disease   . Neuropathy, diabetic (HCC)    lower legs  . Peripheral vascular disease ([place])   . Pneumonia 2015  . Psoriasis   . Tobacco dependence   . Wears dentures    full upper, partial lower    PAST SURGICAL HISTORY:   Past Surgical History:  Procedure Laterality Date  . AV FISTULA PLACEMENT Left 11/2014  . CESAREAN SECTION    . CHOLECYSTECTOMY    . COLONOSCOPY N/A 09/22/2017   Procedure: COLONOSCOPY;  Surgeon:  Lin Landsman, MD;  Location: [facility];  Service: Endoscopy;  Laterality: N/A;  . cyst removed  from left hand Left 1989  . DIALYSIS/PERMA CATHETER INSERTION N/A 05/20/2017   Procedure: Dialysis/Perma Catheter Insertion and fistulagram/LUE angiogram;  Surgeon: Algernon Huxley, MD;  Location: [facility];  Service: Cardiovascular;  Laterality: N/A;  . ESOPHAGOGASTRODUODENOSCOPY N/A 09/22/2017   Procedure: ESOPHAGOGASTRODUODENOSCOPY (EGD);  Surgeon: Lin Landsman, MD;  Location: [place];  Service: Endoscopy;  Laterality: N/A;  . INCISION AND DRAINAGE ABSCESS N/A 07/16/2016   Procedure: INCISION AND DRAINAGE ABSCESS;  Surgeon: Carloyn Manner, MD;  Location: [facility];  Service: ENT;  Laterality: N/A;  . PERIPHERAL VASCULAR CATHETERIZATION N/A 07/25/2015   Procedure: A/V Shuntogram/Fistulagram;  Surgeon: Algernon Huxley, MD;  Location: [place] [facility];  Service: Cardiovascular;  Laterality: N/A;  . PERIPHERAL VASCULAR CATHETERIZATION Left 07/25/2015   Procedure: A/V Shunt Intervention;  Surgeon: Algernon Huxley, MD;  Location: [place] [facility];  Service: Cardiovascular;  Laterality: Left;  . PERIPHERAL VASCULAR CATHETERIZATION Left 10/07/2015   Procedure: A/V Shuntogram/Fistulagram;  Surgeon: Algernon Huxley, MD;  Location: [facility] [facility];  Service: Cardiovascular;  Laterality: Left;  . PERIPHERAL VASCULAR CATHETERIZATION N/A 10/07/2015  Procedure: A/V Shunt Intervention;  Surgeon: Algernon Huxley, MD;  Location: [place] [facility];  Service: Cardiovascular;  Laterality: N/A;  . PERIPHERAL VASCULAR CATHETERIZATION  10/07/2015   Procedure: Dialysis/Perma Catheter Insertion;  Surgeon: Algernon Huxley, MD;  Location: [place] [facility];  Service: Cardiovascular;;  . PERIPHERAL VASCULAR CATHETERIZATION N/A 12/17/2015   Procedure: Dialysis/Perma Catheter Removal;  Surgeon: Katha Cabal, MD;  Location: [place] [facility];  Service: Cardiovascular;  Laterality: N/A;    . PERIPHERAL VASCULAR CATHETERIZATION N/A 01/11/2017   Procedure: Visceral Angiography;  Surgeon: Algernon Huxley, MD;  Location: [place] [facility];  Service: Cardiovascular;  Laterality: N/A;  . PERIPHERAL VASCULAR CATHETERIZATION N/A 01/11/2017   Procedure: Visceral Artery Intervention;  Surgeon: Algernon Huxley, MD;  Location: [place] [facility];  Service: Cardiovascular;  Laterality: N/A;  . POLYPECTOMY  09/22/2017   Procedure: POLYPECTOMY;  Surgeon: Lin Landsman, MD;  Location: [facility];  Service: Endoscopy;;  . rt. tubal and ovary removed    . TONSILLECTOMY    . TUBAL LIGATION      SOCIAL HISTORY:   Social History   Tobacco Use  . Smoking status: Light Tobacco Smoker    Packs/day: 0.25    Years: 20.00    Pack years: 5.00    Types: Cigarettes    Last attempt to quit: 05/21/2017    Years since quitting: 0.5  . Smokeless tobacco: Never Used  Substance Use Topics  . Alcohol use: No    FAMILY HISTORY:   Family History  Problem Relation Age of Onset  . Hypertension Mother   . Heart disease Mother   . Hypertension Father   . Skin cancer Father     DRUG ALLERGIES:   Allergies  Allergen Reactions  . Cinnamon Anaphylaxis  . Garlic Anaphylaxis and Hives  . Onion Hives and Swelling  . Tylenol [Acetaminophen] Anaphylaxis  . Ciprofloxacin Diarrhea  . Prednisone Other (See Comments)    Vaginal blisters & oral blisters    REVIEW OF SYSTEMS:   Review of Systems  Constitutional: Positive for malaise/fatigue. Negative for chills, fever and weight loss.  HENT: Negative for nosebleeds and sore throat.   Eyes: Negative for blurred vision.  Respiratory: Negative for cough, shortness of breath and wheezing.   Cardiovascular: Positive for palpitations. Negative for chest pain, orthopnea, leg swelling and PND.  Gastrointestinal: Positive for abdominal pain. Negative for constipation, diarrhea, heartburn, nausea and vomiting.  Genitourinary: Negative for dysuria and  urgency.  Musculoskeletal: Negative for back pain.  Skin: Negative for rash.  Neurological: Positive for weakness. Negative for dizziness, speech change, focal weakness and headaches.  Endo/Heme/Allergies: Does not bruise/bleed easily.  Psychiatric/Behavioral: Negative for depression.   MEDICATIONS AT HOME:   Prior to Admission medications   Medication Sig Start Date End Date Taking? Authorizing Provider  b complex-vitamin c-folic acid (NEPHRO-VITE) 0.8 MG TABS tablet Take 1 tablet by mouth daily.    Yes [provider]  calcium acetate (PHOSLO) 667 MG capsule Take 1,334 mg by mouth 3 (three) times daily with meals.    Yes [provider]  diltiazem (TIAZAC) 120 MG 24 hr capsule Take 240 mg by mouth 2 (two) times daily.  11/22/14  Yes [provider]  Glecaprevir-Pibrentasvir (MAVYRET) 100-40 MG TABS Take 3 tablets by mouth daily. 11/23/17  Yes Vanga, Tally Due, MD  insulin glargine (LANTUS) 100 UNIT/ML injection Inject 15 Units into the skin daily.    Yes [provider]  insulin regular (NOVOLIN R,HUMULIN R) 100 units/mL injection Inject 0.02-0.04 mLs (2-4 Units total) into the skin 3 (three) times daily with meals. Patient taking differently: Inject 3-5 Units into the skin 3 (three) times daily with meals.  08/19/17  Yes Mikey College, NP  midodrine (PROAMATINE) 5 MG tablet Take 5 mg by mouth as needed (when blood pressure is low).   Yes [provider]  oxyCODONE (OXY IR/ROXICODONE) 5 MG immediate release tablet Take 1 tablet (5 mg total) by mouth 3 (three) times daily as needed for severe pain. For chronic pain To fill on or after: 11/17/17, 12/16/17 To last 30 days from fill date 11/17/17  Yes Gillis Santa, MD  promethazine (PHENERGAN) 25 MG tablet Take 1 tablet (25 mg total) by mouth every 4 (four) hours as needed for nausea or vomiting. 10/19/16  Yes [name], MD  sevelamer carbonate (RENVELA) 800 MG tablet Take 1,600 mg  by mouth 3 (three) times daily with meals.   Yes [provider]  clobetasol ointment (TEMOVATE) 9.62 % Apply 1 application topically daily as needed (for psorasis).     [provider]  gabapentin (NEURONTIN) 100 MG capsule Take 1 capsule (100 mg total) by mouth 3 (three) times daily. OR 3 capsules at bedtime. Patient not taking: Reported on 11/17/2017 08/19/17   Mikey College, NP  glucose 4 GM chewable tablet Chew 1 tablet (4 g total) by mouth as needed for low blood sugar. 08/19/17   Mikey College, NP  loperamide (IMODIUM A-D) 2 MG tablet Take 2 mg by mouth 3 (three) times daily as needed.     [provider]      VITAL SIGNS:  Blood pressure (!) 159/84, pulse 81, temperature 98 F (36.7 C), temperature source Oral, resp. rate (!) 22, height 5\' 7"  (1.702 m), weight 96.2 kg (212 lb), SpO2 92 %.  PHYSICAL EXAMINATION:  Physical Exam  GENERAL:  51 y.o.-year-old patient lying in the bed with no acute distress.  EYES: Pupils equal, round, reactive to light and accommodation. No scleral icterus. Extraocular muscles intact.  HEENT: Head atraumatic, normocephalic. Oropharynx and nasopharynx clear.  NECK:  Supple, no jugular venous distention. No thyroid enlargement, no tenderness.  LUNGS: Normal breath sounds bilaterally, no wheezing, rales,rhonchi or crepitation. No use of accessory muscles of respiration.  CARDIOVASCULAR: S1, S2 normal. No murmurs, rubs, or gallops.  ABDOMEN: Soft, nontender, nondistende d. Bowel sounds present. No organomegaly or mass.  EXTREMITIES: No pedal edema, cyanosis, or clubbing.  NEUROLOGIC: Cranial nerves II through XII are intact. Muscle strength 5/5 in all extremities. Sensation intact. Gait not checked.  PSYCHIATRIC: The patient is alert and oriented x 3.  SKIN: No obvious rash, lesion, or ulcer.  LABORATORY PANEL:   CBC Recent Labs  Lab 12/14/17 2323  WBC 8.5  HGB 12.9  HCT 38.5  PLT 191    ------------------------------------------------------------------------------------------------------------------  Chemistries  Recent Labs  Lab 12/14/17 2323  NA 131*  K 7.1*  CL 98*  CO2 20*  GLUCOSE 183*  BUN 76*  CREATININE 6.63*  CALCIUM 8.3*  MG 2.4  AST 23  ALT 16  ALKPHOS 60  BILITOT 0.9   ------------------------------------------------------------------------------------------------------------------  Cardiac Enzymes Recent Labs  Lab 12/14/17 2323  TROPONINI <0.03   ------------------------------------------------------------------------------------------------------------------  RADIOLOGY:  Ct Abdomen Pelvis Wo Contrast  Result Date: 12/15/2017 CLINICAL DATA:  51 y/o F; generalized weakness and right-sided abdominal pain. History renal failure and hepatitis-C. EXAM: CT ABDOMEN AND PELVIS WITHOUT CONTRAST TECHNIQUE:  Multidetector CT imaging of the abdomen and pelvis was performed following the standard protocol without IV contrast. COMPARISON:  11/20/2016 CT abdomen and pelvis. FINDINGS: Lower chest: Septal thickening at the lung bases probably representing interstitial edema. Small left pleural effusion. Hepatobiliary: No focal liver abnormality is seen. Status post cholecystectomy. No biliary dilatation. Stable nonspecific perihepatic calcifications. Pancreas: Unremarkable. No pancreatic ductal dilatation or surrounding inflammatory changes. Spleen: Normal in size without focal abnormality. Adrenals/Urinary Tract: Normal adrenal glands. Stable 27 mm right interpolar kidney cyst. No other focal kidney lesion. No hydronephrosis. Normal bladder. Stomach/Bowel: Stomach is within normal limits. Appendix appears normal. No evidence of bowel wall thickening, distention, or inflammatory changes. Scattered sigmoid diverticulosis. Vascular/Lymphatic: Aortic atherosclerosis. No enlarged abdominal or pelvic lymph nodes. Reproductive: Uterus and bilateral adnexa are  unremarkable. Other: No abdominal wall hernia or abnormality. No abdominopelvic ascites. Musculoskeletal: No acute or significant osseous findings. IMPRESSION: 1. No acute abdominal or pelvic process identified. 2. Interstitial pulmonary edema and small left pleural effusion. 3. Stable right kidney interpolar cyst. 4. Scattered sigmoid diverticulosis. 5. Aortic atherosclerosis. Electronically Signed   By: Kristine Garbe M.D.   On: 12/15/2017 00:27   IMPRESSION AND PLAN:  51 year old female with a known history of ESRD being admitted for acute hyperkalemia  *Acute hyperkalemia with EKG changes -Potassium was 7.1 -Patient was given calcium chloride with insulin and dextrose -I have also ordered Kayexalate -She will likely need urgent dialysis -discussed with Dr. [name]  *Abdominal pain and leg heaviness - likely due to fluid overload From lack of dialysis -History of the abdomen was negative for any acute pathology  *Hypertension -Continue Cardizem  *Neuropathy -Continue gabapentin  We will admit to stepdown. Case discussed with eICU attending  All the records are reviewed and case discussed with ED provider. Management plans discussed with the patient, nursing and they are in agreement.  CODE STATUS: FULL CODE  TOTAL TIME (CRITICAL CARE) TAKING CARE OF THIS PATIENT: 45 minutes.    Max Sane M.D on 12/15/2017 at 1:34 AM  Between 7am to 6pm - Pager - [phone]  After 6pm go to [email or web address] - password [email or web address] [facility]  [employer] [facility]  Office  [phone]  CC: Primary care physician; Mikey College, NP   Note: This dictation was prepared with Dragon dictation along with smaller phrase technology. Any transcriptional errors that result from this process are unintentional.

## 2017-12-15 NOTE — Progress Notes (Signed)
Talked to Dr. Jerelyn Charles about patient's medication (Mavyret 100-40 mg) that she takes at home for her Hepatitis C and per patient she cannot missed a dose. Medication is also not available and  non-formulary. Medication is very expensive and she will need to restart her course if she missed a dose. Per MD if somebody will bring her medication, we can restart it here. RN will continue to monitor.

## 2017-12-15 NOTE — Progress Notes (Signed)
[facility] Progress Note Patient Name: [name] DOB: [birth year] MRN: [id number]   Date of Service  12/15/2017  HPI/Events of Note  17 F with ESRD presenting with a variety of c/o that are exacerbated by having missed dialysis.  Found to have hyperkalemia and volume overload.  In no resp distress.  On camera check the patient is alert with sats of 98% and RR of 17.   HR is 98 with BP of 168/91  eICU Interventions  Plan per admitting MD  Dialysis Treatment for hyperkalemia PCCM to see Continue to monitor via [facility]     Intervention Category Evaluation Type: New Patient Evaluation  DETERDING,ELIZABETH 12/15/2017, 3:04 AM

## 2017-12-15 NOTE — Progress Notes (Signed)
[place], [place] 12/15/17  Subjective:   Patient known to our practice from outpatient dialysis.  She was last dialyzed on Sunday She reports that yesterday, she started to feel some tingling and weakness in her right arm that progressed to her legs.  She presented to the emergency room for evaluation In the ER, she was found to have a potassium of 7.1 She was given shifting measures and emergency dialysis was requested Repeat potassium at 4 in the morning was 5.6 This morning, patient states that she feels better.  She is sitting up in the bed getting ready to eat breakfast  Objective:  Vital signs in last 24 hours:  Temp:  [98 F (36.7 C)-98.7 F (37.1 C)] 98.7 F (37.1 C) (12/26 0800) Pulse Rate:  [81-101] 98 (12/26 0800) Resp:  [10-25] 10 (12/26 0800) BP: (152-179)/(54-91) 168/54 (12/26 0833) SpO2:  [92 %-100 %] 94 % (12/26 0800) Weight:  [94.8 kg (208 lb 15.9 oz)-96.2 kg (212 lb)] 94.8 kg (208 lb 15.9 oz) (12/26 0457)  Weight change:  Filed Weights   12/14/17 2318 12/15/17 0457  Weight: 96.2 kg (212 lb) 94.8 kg (208 lb 15.9 oz)    Intake/Output:    Intake/Output Summary (Last 24 hours) at 12/15/2017 0842 Last data filed at 12/15/2017 0530 Gross per 24 hour  Intake 100 ml  Output 2 ml  Net 98 ml     Physical Exam: General:  No acute distress, laying in the bed  HEENT  anicteric, moist oral mucous membranes  Neck  supple  Pulm/lungs  lungs are clear to auscultation  CVS/Heart  no rub  Abdomen:   Soft, nontender  Extremities:  No edema  Neurologic:  Alert, oriented  Skin:  No acute rashes  Access:  Left upper arm AV fistula       Basic Metabolic Panel:  Recent Labs  Lab 12/14/17 2323 12/15/17 0353 12/15/17 0614  NA 131* 134*  --   K 7.1* 5.6* 5.7*  CL 98* 98*  --   CO2 20* 21*  --   GLUCOSE 183* 213*  --   BUN 76* 75*  --   CREATININE 6.63* 7.05*  --   CALCIUM 8.3* 8.8*  --   MG 2.4  --   --      CBC: Recent  Labs  Lab 12/14/17 2323 12/15/17 0353  WBC 8.5 6.5  HGB 12.9 12.3  HCT 38.5 36.0  MCV 98.9 98.3  PLT 191 162      Lab Results  Component Value Date   HEPBSAG Negative 01/08/2017   HEPBSAB Non Reactive 01/08/2017      Microbiology:  Recent Results (from the past 240 hour(s))  MRSA PCR Screening     Status: None   Collection Time: 12/15/17  6:36 AM  Result Value Ref Range Status   MRSA by PCR NEGATIVE NEGATIVE Final    Comment:        The GeneXpert MRSA Assay (FDA approved for NASAL specimens only), is one component of a comprehensive MRSA colonization surveillance program. It is not intended to diagnose MRSA infection nor to guide or monitor treatment for MRSA infections. Performed at [facility], [address] [place], [place] [address]     Coagulation Studies: No results for input(s): LABPROT, INR in the last 72 hours.  Urinalysis: No results for input(s): COLORURINE, LABSPEC, PHURINE, GLUCOSEU, HGBUR, BILIRUBINUR, KETONESUR, PROTEINUR, UROBILINOGEN, NITRITE, LEUKOCYTESUR in the last 72 hours.  Invalid input(s): APPERANCEUR  Imaging: Ct Abdomen Pelvis Wo Contrast  Result Date: 12/15/2017 CLINICAL DATA:  51 y/o F; generalized weakness and right-sided abdominal pain. History renal failure and hepatitis-C. EXAM: CT ABDOMEN AND PELVIS WITHOUT CONTRAST TECHNIQUE: Multidetector CT imaging of the abdomen and pelvis was performed following the standard protocol without IV contrast. COMPARISON:  11/20/2016 CT abdomen and pelvis. FINDINGS: Lower chest: Septal thickening at the lung bases probably representing interstitial edema. Small left pleural effusion. Hepatobiliary: No focal liver abnormality is seen. Status post cholecystectomy. No biliary dilatation. Stable nonspecific perihepatic calcifications. Pancreas: Unremarkable. No pancreatic ductal dilatation or surrounding inflammatory changes. Spleen: Normal in size without focal abnormality.  Adrenals/Urinary Tract: Normal adrenal glands. Stable 27 mm right interpolar kidney cyst. No other focal kidney lesion. No hydronephrosis. Normal bladder. Stomach/Bowel: Stomach is within normal limits. Appendix appears normal. No evidence of bowel wall thickening, distention, or inflammatory changes. Scattered sigmoid diverticulosis. Vascular/Lymphatic: Aortic atherosclerosis. No enlarged abdominal or pelvic lymph nodes. Reproductive: Uterus and bilateral adnexa are unremarkable. Other: No abdominal wall hernia or abnormality. No abdominopelvic ascites. Musculoskeletal: No acute or significant osseous findings. IMPRESSION: 1. No acute abdominal or pelvic process identified. 2. Interstitial pulmonary edema and small left pleural effusion. 3. Stable right kidney interpolar cyst. 4. Scattered sigmoid diverticulosis. 5. Aortic atherosclerosis. Electronically Signed   By: Kristine Garbe M.D.   On: 12/15/2017 00:27     Medications:   . sodium chloride     . calcium acetate  1,334 mg Oral TID WC  . diltiazem  240 mg Oral BID  . docusate sodium  100 mg Oral BID  . Glecaprevir-Pibrentasvir  3 tablet Oral Daily  . heparin  5,000 Units Subcutaneous Q8H  . insulin aspart  0-15 Units Subcutaneous TID WC  . insulin glargine  15 Units Subcutaneous Daily  . sevelamer carbonate  1,600 mg Oral TID WC  . sodium chloride flush  3 mL Intravenous Q12H   sodium chloride, bisacodyl, glucose, midodrine, ondansetron **OR** ondansetron (ZOFRAN) IV, promethazine, sodium chloride flush, traZODone  Assessment/ Plan:  51 y.o. female with medical problems of hypertension, diastolic heart failure, diabetes mellitus type 2, and anemia of chronic kidney disease, hepatitis C, depression, psoriasis, peripheral neuropathy, ESRD, history of left foot blister/LLE cellulitis   [facility],MWF  1.  End-stage renal disease 2.  Severe hyperkalemia 3.  Anemia of chronic kidney disease, hemoglobin 12.3 4.   Secondary hyperparathyroidism  Hyperkalemia improved with shifting measures.  However, definitive treatment will be done with dialysis today.  Nursing staff had some difficulty sticking her access therefore smaller needles were used with slower blood flow. Continue sevelamer for phosphorus binding Hold Epogen as hemoglobin is above goal. Repeat potassium tomorrow      LOS: 0 Mirha Brucato [name] 12/26/20188:42 AM  [facility] [place], [place]

## 2017-12-15 NOTE — Progress Notes (Signed)
Pt has remained alert and oriented with c/o itching and intermittent lower back pain. Itching->IV benadryl with moderate relief; lower back pain->home oxy 5mg  ordered for moderate pain.  NSR on cardiac monitor, BP elevated. Dialysis today-UF 1.5 L removed. K 3.5. Vascular has been consulted to evaluate for fistulogram d/t increased access pressures.  Pt has remained on RA; lung sounds clear to auscultation; SpO2 > 95%. Pt with orders to tx to floor.

## 2017-12-15 NOTE — Progress Notes (Signed)
Pt is comfortable on RA with stable hemodynamics. K+ is minimally elevated. I will place orders for transfer to occur after HD completed   Merton Border, MD PCCM service Mobile [phone] Pager [phone] 12/15/2017 8:40 AM

## 2017-12-15 NOTE — Consult Note (Signed)
PULMONARY / CRITICAL CARE MEDICINE   Name: [name] MRN: [id number] DOB: 08/22/1966    ADMISSION DATE:  12/14/2017 CONSULTATION DATE: 12/15/2017  REFERRING MD:  Dr Manuella Ghazi  Reason: Hypokalemia and need for emergent hemodialysis  HISTORY OF PRESENT ILLNESS:   51 year old female with a past medical history as indicated below who presented to the ED with worsening weakness and right-sided abdominal pain.  ED workup showed a potassium level of 7.1.  Patient is being admitted to the ICU for emergent hemodialysis  PAST MEDICAL HISTORY :  She  has a past medical history of Allergy, Anemia, Anxiety, Arthritis, Ataxia, CHF (congestive heart failure) ([place]), Chronic kidney disease, COPD (chronic obstructive pulmonary disease) ([place]), Depression, Diabetes mellitus without complication ([place]), Emphysema of lung ([place]), End stage renal disease ([place]), Fistula, Headache, Hepatitis (2003), Hiatal hernia, Hypercholesterolemia, Hypertension, Kidney disease, Neuropathy, diabetic ([place]), Peripheral vascular disease ([place]), Pneumonia (2015), Psoriasis, Tobacco dependence, and Wears dentures.  PAST SURGICAL HISTORY: She  has a past surgical history that includes Tonsillectomy; rt. tubal and ovary removed; Cholecystectomy; Cardiac catheterization (N/A, 07/25/2015); Cardiac catheterization (Left, 07/25/2015); Cardiac catheterization (Left, 10/07/2015); Cardiac catheterization (N/A, 10/07/2015); Cardiac catheterization (10/07/2015); Cardiac catheterization (N/A, 12/17/2015); Incision and drainage abscess (N/A, 07/16/2016); cyst removed  from left hand (Left, 1989); AV fistula placement (Left, 11/2014); Cardiac catheterization (N/A, 01/11/2017); Cardiac catheterization (N/A, 01/11/2017); DIALYSIS/PERMA CATHETER INSERTION (N/A, 05/20/2017); Cesarean section; Tubal ligation; Colonoscopy (N/A, 09/22/2017); Esophagogastroduodenoscopy (N/A, 09/22/2017); and polypectomy (09/22/2017).  Allergies  Allergen Reactions  . Cinnamon Anaphylaxis   . Garlic Anaphylaxis and Hives  . Onion Hives and Swelling  . Tylenol [Acetaminophen] Anaphylaxis  . Ciprofloxacin Diarrhea  . Prednisone Other (See Comments)    Vaginal blisters & oral blisters    No current facility-administered medications on file prior to encounter.    Current Outpatient Medications on File Prior to Encounter  Medication Sig  . b complex-vitamin c-folic acid (NEPHRO-VITE) 0.8 MG TABS tablet Take 1 tablet by mouth daily.   . calcium acetate (PHOSLO) 667 MG capsule Take 1,334 mg by mouth 3 (three) times daily with meals.   Marland Kitchen diltiazem (TIAZAC) 120 MG 24 hr capsule Take 240 mg by mouth 2 (two) times daily.   . Glecaprevir-Pibrentasvir (MAVYRET) 100-40 MG TABS Take 3 tablets by mouth daily.  . insulin glargine (LANTUS) 100 UNIT/ML injection Inject 15 Units into the skin daily.   . insulin regular (NOVOLIN R,HUMULIN R) 100 units/mL injection Inject 0.02-0.04 mLs (2-4 Units total) into the skin 3 (three) times daily with meals. (Patient taking differently: Inject 3-5 Units into the skin 3 (three) times daily with meals. )  . midodrine (PROAMATINE) 5 MG tablet Take 5 mg by mouth as needed (when blood pressure is low).  Marland Kitchen oxyCODONE (OXY IR/ROXICODONE) 5 MG immediate release tablet Take 1 tablet (5 mg total) by mouth 3 (three) times daily as needed for severe pain. For chronic pain To fill on or after: 11/17/17, 12/16/17 To last 30 days from fill date  . promethazine (PHENERGAN) 25 MG tablet Take 1 tablet (25 mg total) by mouth every 4 (four) hours as needed for nausea or vomiting.  . sevelamer carbonate (RENVELA) 800 MG tablet Take 1,600 mg by mouth 3 (three) times daily with meals.  . clobetasol ointment (TEMOVATE) 5.63 % Apply 1 application topically daily as needed (for psorasis).   . gabapentin (NEURONTIN) 100 MG capsule Take 1 capsule (100 mg total) by mouth 3 (three) times daily. OR 3 capsules at bedtime. (Patient not taking: Reported on  11/17/2017)  . glucose 4 GM  chewable tablet Chew 1 tablet (4 g total) by mouth as needed for low blood sugar.  . loperamide (IMODIUM A-D) 2 MG tablet Take 2 mg by mouth 3 (three) times daily as needed.     FAMILY HISTORY:  Her indicated that her mother is alive. She indicated that her father is alive.   SOCIAL HISTORY: She  reports that she has been smoking cigarettes.  She has a 5.00 pack-year smoking history. she has never used smokeless tobacco. She reports that she does not drink alcohol or use drugs.  REVIEW OF SYSTEMS:   All systems reviewed.  Pertinent positives include mild right upper quadrant abdominal pain, generalized weakness and mild shortness of breath.  All other systems are negative SUBJECTIVE:   VITAL SIGNS: BP (!) 168/54   Pulse 98   Temp 98.7 F (37.1 C) (Oral)   Resp 10   Ht 5\' 7"  (1.702 m)   Wt 94.8 kg (208 lb 15.9 oz)   SpO2 94%   BMI 32.73 kg/m   HEMODYNAMICS:    VENTILATOR SETTINGS:    INTAKE / OUTPUT: I/O last 3 completed shifts: In: 100 [IV Piggyback:100] Out: 2 [Stool:2]  PHYSICAL EXAMINATION: General:  nad Neuro:  CN intact HEENT:  perrla Cardiovascular: RRR, +2 edema Lungs:  Bibasilar crackles; no wheezing Abdomen:  Non-distended, mild RUQ pain with palpation Musculoskeletal:  +rom Skin:  Warm and dry  LABS:  BMET Recent Labs  Lab 12/14/17 2323 12/15/17 0353 12/15/17 0614  NA 131* 134*  --   K 7.1* 5.6* 5.7*  CL 98* 98*  --   CO2 20* 21*  --   BUN 76* 75*  --   CREATININE 6.63* 7.05*  --   GLUCOSE 183* 213*  --     Electrolytes Recent Labs  Lab 12/14/17 2323 12/15/17 0353  CALCIUM 8.3* 8.8*  MG 2.4  --     CBC Recent Labs  Lab 12/14/17 2323 12/15/17 0353  WBC 8.5 6.5  HGB 12.9 12.3  HCT 38.5 36.0  PLT 191 162    Coag's No results for input(s): APTT, INR in the last 168 hours.  Sepsis Markers No results for input(s): LATICACIDVEN, PROCALCITON, O2SATVEN in the last 168 hours.  ABG No results for input(s): PHART, PCO2ART,  PO2ART in the last 168 hours.  Liver Enzymes Recent Labs  Lab 12/14/17 2323  AST 23  ALT 16  ALKPHOS 60  BILITOT 0.9  ALBUMIN 3.7    Cardiac Enzymes Recent Labs  Lab 12/14/17 2323  TROPONINI <0.03    Glucose Recent Labs  Lab 12/15/17 0257  GLUCAP 142*    Imaging Ct Abdomen Pelvis Wo Contrast  Result Date: 12/15/2017 CLINICAL DATA:  51 y/o F; generalized weakness and right-sided abdominal pain. History renal failure and hepatitis-C. EXAM: CT ABDOMEN AND PELVIS WITHOUT CONTRAST TECHNIQUE: Multidetector CT imaging of the abdomen and pelvis was performed following the standard protocol without IV contrast. COMPARISON:  11/20/2016 CT abdomen and pelvis. FINDINGS: Lower chest: Septal thickening at the lung bases probably representing interstitial edema. Small left pleural effusion. Hepatobiliary: No focal liver abnormality is seen. Status post cholecystectomy. No biliary dilatation. Stable nonspecific perihepatic calcifications. Pancreas: Unremarkable. No pancreatic ductal dilatation or surrounding inflammatory changes. Spleen: Normal in size without focal abnormality. Adrenals/Urinary Tract: Normal adrenal glands. Stable 27 mm right interpolar kidney cyst. No other focal kidney lesion. No hydronephrosis. Normal bladder. Stomach/Bowel: Stomach is within normal limits. Appendix appears normal. No evidence of  bowel wall thickening, distention, or inflammatory changes. Scattered sigmoid diverticulosis. Vascular/Lymphatic: Aortic atherosclerosis. No enlarged abdominal or pelvic lymph nodes. Reproductive: Uterus and bilateral adnexa are unremarkable. Other: No abdominal wall hernia or abnormality. No abdominopelvic ascites. Musculoskeletal: No acute or significant osseous findings. IMPRESSION: 1. No acute abdominal or pelvic process identified. 2. Interstitial pulmonary edema and small left pleural effusion. 3. Stable right kidney interpolar cyst. 4. Scattered sigmoid diverticulosis. 5. Aortic  atherosclerosis. Electronically Signed   By: Kristine Garbe M.D.   On: 12/15/2017 00:27    DISCUSSION: 51 year old female with end-stage renal disease on him this is presenting with acute pulmonary edema from volume overload  ASSESSMENT  Acute pulmonary edema End-stage renal disease on hemodialysis Hyperkalemia  PLAN Emergent dialysis per nephrology Supplemental oxygen as needed Monitor and correct electrolyte imbalances Okay to transfer out of the ICU post dialysis if stable   Magdalene S. [facility] ANP-BC Pulmonary and [place] Pager [phone] or [phone] 12/15/2017, 8:53 AM    PCCM ATTENDING ATTESTATION:  I have evaluated patient with the APP [place], reviewed database in its entirety and discussed care plan in detail. In addition, this patient was discussed on multidisciplinary rounds.   I agree with the above findings, assessment and plan   Merton Border, MD PCCM service Mobile [phone] Pager [phone] 12/15/2017 1:07 PM

## 2017-12-15 NOTE — Progress Notes (Signed)
HD tx start 

## 2017-12-15 NOTE — Progress Notes (Signed)
HD tx end  

## 2017-12-15 NOTE — Progress Notes (Signed)
Patient admitted this shift from the ED. On RA, but for emergent dialysis. Had kaexylate, D50 1 amp and 10 U insulin once admitted to the unit, had Ca in ED. K already trending down to 5.6 from 7.1, but was taken before patient had two BMs. Continue to monitor.

## 2017-12-15 NOTE — Progress Notes (Signed)
Pre HD assessment  

## 2017-12-15 NOTE — Progress Notes (Signed)
Post HD assessment  

## 2017-12-16 ENCOUNTER — Inpatient Hospital Stay
Admission: EM | Disposition: A | Discharge status: Home Health | Payer: Self-pay | Source: Home / Self Care | Attending: Internal Medicine

## 2017-12-16 DIAGNOSIS — N186 End stage renal disease: Secondary | ICD-10-CM

## 2017-12-16 DIAGNOSIS — E119 Type 2 diabetes mellitus without complications: Secondary | ICD-10-CM

## 2017-12-16 DIAGNOSIS — R600 Localized edema: Secondary | ICD-10-CM

## 2017-12-16 DIAGNOSIS — T82868A Thrombosis of vascular prosthetic devices, implants and grafts, initial encounter: Secondary | ICD-10-CM

## 2017-12-16 DIAGNOSIS — I10 Essential (primary) hypertension: Secondary | ICD-10-CM

## 2017-12-16 HISTORY — PX: A/V SHUNT INTERVENTION: CATH118220

## 2017-12-16 LAB — BASIC METABOLIC PANEL
ANION GAP: 10 (ref 5–15)
BUN: 44 mg/dL — AB (ref 6–20)
CALCIUM: 8.3 mg/dL — AB (ref 8.9–10.3)
CO2: 29 mmol/L (ref 22–32)
Chloride: 96 mmol/L — ABNORMAL LOW (ref 101–111)
Creatinine, Ser: 4.83 mg/dL — ABNORMAL HIGH (ref 0.44–1.00)
GFR calc Af Amer: 11 mL/min — ABNORMAL LOW (ref >60)
GFR, EST NON AFRICAN AMERICAN: 9 mL/min — AB (ref >60)
GLUCOSE: 60 mg/dL — AB (ref 65–99)
POTASSIUM: 5 mmol/L (ref 3.5–5.1)
Sodium: 135 mmol/L (ref 135–145)

## 2017-12-16 LAB — HIV ANTIBODY (ROUTINE TESTING W REFLEX): HIV SCREEN 4TH GENERATION: NONREACTIVE

## 2017-12-16 LAB — GLUCOSE, CAPILLARY
GLUCOSE-CAPILLARY: 96 mg/dL (ref 65–99)
GLUCOSE-CAPILLARY: 128 mg/dL — AB (ref 65–99)
Glucose-Capillary: 62 mg/dL — ABNORMAL LOW (ref 65–99)
Glucose-Capillary: 118 mg/dL — ABNORMAL HIGH (ref 65–99)
Glucose-Capillary: 145 mg/dL — ABNORMAL HIGH (ref 65–99)
Glucose-Capillary: 257 mg/dL — ABNORMAL HIGH (ref 65–99)

## 2017-12-16 SURGERY — A/V SHUNT INTERVENTION
Anesthesia: Moderate Sedation

## 2017-12-16 MED ORDER — INSULIN ASPART 100 UNIT/ML ~~LOC~~ SOLN
0.0000 [IU] | Freq: Three times a day (TID) | SUBCUTANEOUS | Status: DC
  Administered 2017-12-16 – 2017-12-18 (×3): 1 [IU] via SUBCUTANEOUS
  Administered 2017-12-18: 2 [IU] via SUBCUTANEOUS
  Filled 2017-12-16 (×5): qty 1

## 2017-12-16 MED ORDER — FENTANYL CITRATE (PF) 100 MCG/2ML IJ SOLN
INTRAMUSCULAR | Status: AC
  Filled 2017-12-16: qty 2

## 2017-12-16 MED ORDER — SODIUM CHLORIDE 0.9 % IV SOLN
INTRAVENOUS | Status: DC
  Administered 2017-12-16: 1000 mL via INTRAVENOUS

## 2017-12-16 MED ORDER — LIDOCAINE-EPINEPHRINE (PF) 1 %-1:200000 IJ SOLN
INTRAMUSCULAR | Status: AC
  Filled 2017-12-16: qty 30

## 2017-12-16 MED ORDER — GLECAPREVIR-PIBRENTASVIR 100-40 MG PO TABS
3.0000 | ORAL_TABLET | Freq: Every day | ORAL | Status: DC
  Administered 2017-12-16 – 2017-12-17 (×2): 3 via ORAL
  Filled 2017-12-16 (×3): qty 3

## 2017-12-16 MED ORDER — IOPAMIDOL (ISOVUE-300) INJECTION 61%
INTRAVENOUS | Status: DC | PRN
  Administered 2017-12-16: 25 mL via INTRAVENOUS

## 2017-12-16 MED ORDER — MIDAZOLAM HCL 2 MG/2ML IJ SOLN
INTRAMUSCULAR | Status: AC
  Filled 2017-12-16: qty 4

## 2017-12-16 MED ORDER — HEPARIN SODIUM (PORCINE) 1000 UNIT/ML IJ SOLN
INTRAMUSCULAR | Status: AC
  Filled 2017-12-16: qty 1

## 2017-12-16 MED ORDER — HEPARIN SODIUM (PORCINE) 1000 UNIT/ML IJ SOLN
INTRAMUSCULAR | Status: DC | PRN
  Administered 2017-12-16: 3000 [IU] via INTRAVENOUS

## 2017-12-16 MED ORDER — MIDAZOLAM HCL 2 MG/2ML IJ SOLN
INTRAMUSCULAR | Status: DC | PRN
  Administered 2017-12-16: 1 mg via INTRAVENOUS
  Administered 2017-12-16: 2 mg via INTRAVENOUS

## 2017-12-16 MED ORDER — DIPHENHYDRAMINE HCL 25 MG PO CAPS
25.0000 mg | ORAL_CAPSULE | Freq: Four times a day (QID) | ORAL | Status: DC | PRN
  Administered 2017-12-17 – 2017-12-18 (×3): 25 mg via ORAL
  Filled 2017-12-16 (×3): qty 1

## 2017-12-16 MED ORDER — DEXTROSE 5 % IV SOLN
1.5000 g | INTRAVENOUS | Status: AC
  Administered 2017-12-16: 1.5 g via INTRAVENOUS
  Filled 2017-12-16: qty 1.5

## 2017-12-16 MED ORDER — FENTANYL CITRATE (PF) 100 MCG/2ML IJ SOLN
INTRAMUSCULAR | Status: DC | PRN
  Administered 2017-12-16: 25 ug via INTRAVENOUS
  Administered 2017-12-16: 50 ug via INTRAVENOUS

## 2017-12-16 MED ORDER — OXYCODONE HCL 5 MG PO TABS
10.0000 mg | ORAL_TABLET | Freq: Once | ORAL | Status: AC
  Administered 2017-12-16: 10 mg via ORAL

## 2017-12-16 MED ORDER — OXYCODONE HCL 5 MG PO TABS
ORAL_TABLET | ORAL | Status: AC
  Filled 2017-12-16: qty 2

## 2017-12-16 MED ORDER — INSULIN ASPART 100 UNIT/ML ~~LOC~~ SOLN: 0.0000 [IU] | Freq: Three times a day (TID) | SUBCUTANEOUS | Status: DC

## 2017-12-16 SURGICAL SUPPLY — 13 items
BALLN LUTONIX AV 7X60X75 (BALLOONS) ×3
BALLN ULTRVRSE 8X60X75C (BALLOONS) ×3
BALLOON LUTONIX AV 7X60X75 (BALLOONS) ×1 IMPLANT
BALLOON ULTRVRSE 8X60X75C (BALLOONS) ×1 IMPLANT
CANNULA 5F STIFF (CANNULA) ×3 IMPLANT
DEVICE PRESTO INFLATION (MISCELLANEOUS) ×6 IMPLANT
DRAPE BRACHIAL (DRAPES) ×3 IMPLANT
PACK ANGIOGRAPHY (CUSTOM PROCEDURE TRAY) ×3 IMPLANT
SHEATH BRITE TIP 6FRX5.5 (SHEATH) ×3 IMPLANT
SUT MNCRL AB 4-0 PS2 18 (SUTURE) ×3 IMPLANT
TOWEL OR 17X26 4PK STRL BLUE (TOWEL DISPOSABLE) ×3 IMPLANT
WIRE MAGIC TOR.035 180C (WIRE) ×3 IMPLANT
WIRE NITINOL .018 (WIRE) ×3 IMPLANT

## 2017-12-16 NOTE — H&P (Signed)
[facility] Vascular Consult Note  MRN : [id number]  [name] is a 51 y.o. (1965/12/23) female who presents with chief complaint of  Chief Complaint  Patient presents with  . Weakness  .  History of Present Illness: I am asked to see the patient by Dr. [name] for evaluation of her left arm AV fistula.  They have had issues with difficult access as well as low flow rates and prolonged bleeding.  She has had multiple previous interventions on this access but none recently.  She denies fevers or chills.  She is admitted with weakness which has improved with medical therapy.  Some left arm pain and swelling is chronic but nothing exacerbating recently.  Current Facility-Administered Medications  Medication Dose Route Frequency Provider Last Rate Last Dose  . [MAR Hold] 0.9 %  sodium chloride infusion  250 mL Intravenous PRN Manuella Ghazi, Vipul, MD      . 0.9 %  sodium chloride infusion   Intravenous Continuous Dew, Erskine Squibb, MD      . Doug Sou Hold] bisacodyl (DULCOLAX) EC tablet 5 mg  5 mg Oral Daily PRN Max Sane, MD      . Doug Sou Hold] calcium acetate (PHOSLO) capsule 1,334 mg  1,334 mg Oral TID WC Max Sane, MD   1,334 mg at 12/16/17 0825  . cefUROXime (ZINACEF) 1.5 g in dextrose 5 % 50 mL IVPB  1.5 g Intravenous 30 min Pre-Op Algernon Huxley, MD      . Doug Sou Hold] diltiazem (CARDIZEM CD) 24 hr capsule 240 mg  240 mg Oral BID Max Sane, MD   240 mg at 12/16/17 1042  . [MAR Hold] diphenhydrAMINE (BENADRYL) capsule 25 mg  25 mg Oral Q6H PRN Vaughan Basta, MD      . Doug Sou Hold] docusate sodium (COLACE) capsule 100 mg  100 mg Oral BID Max Sane, MD   Stopped at 12/15/17 2200  . [MAR Hold] Glecaprevir-Pibrentasvir 100-40 MG TABS 3 tablet  3 tablet Oral Daily Pernell Dupre, RPH      . [MAR Hold] glucose chewable tablet 4 g  1 tablet Oral PRN Max Sane, MD   12 g at 12/16/17 0415  . [MAR Hold] heparin injection 5,000 Units  5,000 Units Subcutaneous Q8H Max Sane,  MD      . Doug Sou Hold] insulin aspart (novoLOG) injection 0-9 Units  0-9 Units Subcutaneous TID WC Vaughan Basta, MD      . Doug Sou Hold] insulin glargine (LANTUS) injection 15 Units  15 Units Subcutaneous Daily Max Sane, MD   15 Units at 12/16/17 1041  . [MAR Hold] midodrine (PROAMATINE) tablet 5 mg  5 mg Oral PRN Max Sane, MD      . Doug Sou Hold] multivitamin (RENA-VIT) tablet 1 tablet  1 tablet Oral QHS Wilhelmina Mcardle, MD   1 tablet at 12/15/17 2149  . [MAR Hold] ondansetron (ZOFRAN) tablet 4 mg  4 mg Oral Q6H PRN Max Sane, MD       Or  . Doug Sou Hold] ondansetron (ZOFRAN) injection 4 mg  4 mg Intravenous Q6H PRN Max Sane, MD      . Doug Sou Hold] oxyCODONE (Oxy IR/ROXICODONE) immediate release tablet 5 mg  5 mg Oral TID PRN Vaughan Basta, MD   5 mg at 12/15/17 1822  . [MAR Hold] promethazine (PHENERGAN) tablet 25 mg  25 mg Oral Q4H PRN Max Sane, MD      . Doug Sou Hold] sevelamer carbonate (RENVELA) tablet 1,600 mg  1,600 mg Oral TID WC Max Sane, MD   1,600 mg at 12/16/17 0825  . [MAR Hold] sodium chloride flush (NS) 0.9 % injection 3 mL  3 mL Intravenous Q12H Max Sane, MD   3 mL at 12/16/17 1042  . [MAR Hold] sodium chloride flush (NS) 0.9 % injection 3 mL  3 mL Intravenous PRN Max Sane, MD      . Doug Sou Hold] traZODone (DESYREL) tablet 25 mg  25 mg Oral QHS PRN Max Sane, MD        Past Medical History:  Diagnosis Date  . Allergy   . Anemia   . Anxiety    worse when not at home - bowel incontinence  . Arthritis   . Ataxia   . CHF (congestive heart failure) ([facility])    history of, has been cleared.  . Chronic kidney disease   . COPD (chronic obstructive pulmonary disease) ([place])   . Depression   . Diabetes mellitus without complication ([place])   . Emphysema of lung ([place])   . End stage renal disease ([place])    MWF dialysis  . Fistula    left upper arm  . Headache    migraines  . Hepatitis 2003   Hep C  . Hiatal hernia   . Hypercholesterolemia    pt denies   . Hypertension   . Kidney disease   . Neuropathy, diabetic ([facility])    lower legs  . Peripheral vascular disease ([place])   . Pneumonia 2015  . Psoriasis   . Tobacco dependence   . Wears dentures    full upper, partial lower    Past Surgical History:  Procedure Laterality Date  . AV FISTULA PLACEMENT Left 11/2014  . CESAREAN SECTION    . CHOLECYSTECTOMY    . COLONOSCOPY N/A 09/22/2017   Procedure: COLONOSCOPY;  Surgeon: Lin Landsman, MD;  Location: [facility];  Service: Endoscopy;  Laterality: N/A;  . cyst removed  from left hand Left 1989  . DIALYSIS/PERMA CATHETER INSERTION N/A 05/20/2017   Procedure: Dialysis/Perma Catheter Insertion and fistulagram/LUE angiogram;  Surgeon: Algernon Huxley, MD;  Location: [facility];  Service: Cardiovascular;  Laterality: N/A;  . ESOPHAGOGASTRODUODENOSCOPY N/A 09/22/2017   Procedure: ESOPHAGOGASTRODUODENOSCOPY (EGD);  Surgeon: Lin Landsman, MD;  Location: [facility];  Service: Endoscopy;  Laterality: N/A;  . INCISION AND DRAINAGE ABSCESS N/A 07/16/2016   Procedure: INCISION AND DRAINAGE ABSCESS;  Surgeon: Carloyn Manner, MD;  Location: [facility];  Service: ENT;  Laterality: N/A;  . PERIPHERAL VASCULAR CATHETERIZATION N/A 07/25/2015   Procedure: A/V Shuntogram/Fistulagram;  Surgeon: Algernon Huxley, MD;  Location: [facility];  Service: Cardiovascular;  Laterality: N/A;  . PERIPHERAL VASCULAR CATHETERIZATION Left 07/25/2015   Procedure: A/V Shunt Intervention;  Surgeon: Algernon Huxley, MD;  Location: [facility];  Service: Cardiovascular;  Laterality: Left;  . PERIPHERAL VASCULAR CATHETERIZATION Left 10/07/2015   Procedure: A/V Shuntogram/Fistulagram;  Surgeon: Algernon Huxley, MD;  Location: [facility];  Service: Cardiovascular;  Laterality: Left;  . PERIPHERAL VASCULAR CATHETERIZATION N/A 10/07/2015   Procedure: A/V Shunt Intervention;  Surgeon: Algernon Huxley, MD;  Location: [facility];   Service: Cardiovascular;  Laterality: N/A;  . PERIPHERAL VASCULAR CATHETERIZATION  10/07/2015   Procedure: Dialysis/Perma Catheter Insertion;  Surgeon: Algernon Huxley, MD;  Location: [place] [facility];  Service: Cardiovascular;;  . PERIPHERAL VASCULAR CATHETERIZATION N/A 12/17/2015   Procedure: Dialysis/Perma Catheter Removal;  Surgeon: Belenda Cruise  Eloise Levels, MD;  Location: [facility] [facility];  Service: Cardiovascular;  Laterality: N/A;  . PERIPHERAL VASCULAR CATHETERIZATION N/A 01/11/2017   Procedure: Visceral Angiography;  Surgeon: Algernon Huxley, MD;  Location: [place] [facility];  Service: Cardiovascular;  Laterality: N/A;  . PERIPHERAL VASCULAR CATHETERIZATION N/A 01/11/2017   Procedure: Visceral Artery Intervention;  Surgeon: Algernon Huxley, MD;  Location: [place] [facility];  Service: Cardiovascular;  Laterality: N/A;  . POLYPECTOMY  09/22/2017   Procedure: POLYPECTOMY;  Surgeon: Lin Landsman, MD;  Location: [facility];  Service: Endoscopy;;  . rt. tubal and ovary removed    . TONSILLECTOMY    . TUBAL LIGATION      Social History Social History   Tobacco Use  . Smoking status: Light Tobacco Smoker    Packs/day: 0.25    Years: 20.00    Pack years: 5.00    Types: Cigarettes    Last attempt to quit: 05/21/2017    Years since quitting: 0.5  . Smokeless tobacco: Never Used  Substance Use Topics  . Alcohol use: No  . Drug use: No    Family History Family History  Problem Relation Age of Onset  . Hypertension Mother   . Heart disease Mother   . Hypertension Father   . Skin cancer Father   No bleeding or clotting disorders  Allergies  Allergen Reactions  . Cinnamon Anaphylaxis  . Garlic Anaphylaxis and Hives  . Onion Hives and Swelling  . Tylenol [Acetaminophen] Anaphylaxis  . Ciprofloxacin Diarrhea  . Prednisone Other (See Comments)    Vaginal blisters & oral blisters     REVIEW OF SYSTEMS (Negative unless checked)  Constitutional: [] Weight loss  [] Fever   [] Chills Cardiac: [] Chest pain   [] Chest pressure   [x] Palpitations   [] Shortness of breath when laying flat   [x] Shortness of breath at rest   [x] Shortness of breath with exertion. Vascular:  [] Pain in legs with walking   [] Pain in legs at rest   [] Pain in legs when laying flat   [] Claudication   [] Pain in feet when walking  [] Pain in feet at rest  [] Pain in feet when laying flat   [] History of DVT   [] Phlebitis   [] Swelling in legs   [] Varicose veins   [] Non-healing ulcers Pulmonary:   [] Uses home oxygen   [] Productive cough   [] Hemoptysis   [] Wheeze  [x] COPD   [x] Asthma Neurologic:  [] Dizziness  [] Blackouts   [] Seizures   [] History of stroke   [] History of TIA  [] Aphasia   [] Temporary blindness   [] Dysphagia   [] Weakness or numbness in arms   [x] Weakness or numbness in legs Musculoskeletal:  [x] Arthritis   [] Joint swelling   [] Joint pain   [] Low back pain Hematologic:  [x] Easy bruising  [] Easy bleeding   [] Hypercoagulable state   [x] Anemic  [x] Hepatitis Gastrointestinal:  [] Blood in stool   [] Vomiting blood  [x] Gastroesophageal reflux/heartburn   [] Difficulty swallowing. Genitourinary:  [x] Chronic kidney disease   [] Difficult urination  [] Frequent urination  [] Burning with urination   [] Blood in urine Skin:  [] Rashes   [] Ulcers   [] Wounds Psychological:  [x] History of anxiety   [x]  History of major depression.  Physical Examination  Vitals:   12/16/17 0559 12/16/17 1042 12/16/17 1321 12/16/17 1536  BP: (!) 154/66 (!) 170/81 (!) 165/68 (!) 178/72  Pulse: 73  74 74  Resp: 18  18 20   Temp: (!) 97.5 F (36.4 C)  98.1 F (36.7 C) 98.4 F (36.9 C)  TempSrc: Oral  Oral Oral  SpO2: 98%  100% 97%  Weight:    96.6 kg (213 lb)  Height:    5\' 7"  (1.702 m)   Body mass index is 33.36 kg/m. Gen:  WD/WN, NAD Head: [place]/AT, No temporalis wasting.  Ear/Nose/Throat: Hearing grossly intact, nares w/o erythema or drainage, oropharynx w/o Erythema/Exudate Eyes: Sclera non-icteric, conjunctiva  clear Neck: Trachea midline.  No JVD.  Pulmonary:  Good air movement, respirations not labored, equal bilaterally.  Cardiac: Irregular Vascular: Soft thrill is present in left brachiocephalic AV fistula Vessel Right Left  Radial Palpable Palpable                                    Musculoskeletal: M/S 5/5 throughout.  Extremities without ischemic changes.  No deformity or atrophy.  Mild lower extremity edema. Neurologic: Sensation grossly intact in extremities.  Symmetrical.  Speech is fluent. Motor exam as listed above. Psychiatric: Judgment intact, Mood & affect appropriate for pt's clinical situation. Dermatologic: No rashes or ulcers noted.  No cellulitis or open wounds.       CBC Lab Results  Component Value Date   WBC 6.5 12/15/2017   HGB 12.3 12/15/2017   HCT 36.0 12/15/2017   MCV 98.3 12/15/2017   PLT 162 12/15/2017    BMET    Component Value Date/Time   NA 135 12/16/2017 0352   NA 99 (A) 09/13/2017   NA 133 (L) 10/31/2014 0955   K 5.0 12/16/2017 0352   K 4.7 10/31/2014 0955   CL 96 (L) 12/16/2017 0352   CL 102 10/31/2014 0955   CO2 29 12/16/2017 0352   CO2 27 10/31/2014 0955   GLUCOSE 60 (L) 12/16/2017 0352   GLUCOSE 203 (H) 10/31/2014 0955   BUN 44 (H) 12/16/2017 0352   BUN 35 (H) 10/31/2014 0955   CREATININE 4.83 (H) 12/16/2017 0352   CREATININE 2.80 (H) 10/31/2014 0955   CALCIUM 8.3 (L) 12/16/2017 0352   CALCIUM 7.7 (L) 10/31/2014 0955   GFRNONAA 9 (L) 12/16/2017 0352   GFRNONAA 20 (L) 10/29/2014 0429   GFRNONAA 34 (L) 06/02/2013 1405   GFRAA 11 (L) 12/16/2017 0352   GFRAA 24 (L) 10/29/2014 0429   GFRAA 39 (L) 06/02/2013 1405   Estimated Creatinine Clearance: 16.1 mL/min (A) (by C-G formula based on SCr of 4.83 mg/dL (H)).  COAG Lab Results  Component Value Date   INR 1.01 10/12/2017   INR 0.94 05/19/2017   INR 1.04 12/16/2016    Radiology Ct Abdomen Pelvis Wo Contrast  Result Date: 12/15/2017 CLINICAL DATA:  51 y/o F;  generalized weakness and right-sided abdominal pain. History renal failure and hepatitis-C. EXAM: CT ABDOMEN AND PELVIS WITHOUT CONTRAST TECHNIQUE: Multidetector CT imaging of the abdomen and pelvis was performed following the standard protocol without IV contrast. COMPARISON:  11/20/2016 CT abdomen and pelvis. FINDINGS: Lower chest: Septal thickening at the lung bases probably representing interstitial edema. Small left pleural effusion. Hepatobiliary: No focal liver abnormality is seen. Status post cholecystectomy. No biliary dilatation. Stable nonspecific perihepatic calcifications. Pancreas: Unremarkable. No pancreatic ductal dilatation or surrounding inflammatory changes. Spleen: Normal in size without focal abnormality. Adrenals/Urinary Tract: Normal adrenal glands. Stable 27 mm right interpolar kidney cyst. No other focal kidney lesion. No hydronephrosis. Normal bladder. Stomach/Bowel: Stomach is within normal limits. Appendix appears normal. No evidence of bowel wall thickening, distention, or inflammatory changes. Scattered sigmoid diverticulosis. Vascular/Lymphatic: Aortic atherosclerosis. No  enlarged abdominal or pelvic lymph nodes. Reproductive: Uterus and bilateral adnexa are unremarkable. Other: No abdominal wall hernia or abnormality. No abdominopelvic ascites. Musculoskeletal: No acute or significant osseous findings. IMPRESSION: 1. No acute abdominal or pelvic process identified. 2. Interstitial pulmonary edema and small left pleural effusion. 3. Stable right kidney interpolar cyst. 4. Scattered sigmoid diverticulosis. 5. Aortic atherosclerosis. Electronically Signed   By: Kristine Garbe M.D.   On: 12/15/2017 00:27      Assessment/Plan 1.  Dysfunction of dialysis access.  Asked to see the patient by nephrology and we will perform a fistulogram today. 2.  End-stage renal disease.  Access issues as above.  Has been a recurring issue for her over time. 3.  Diabetes.  Stable on  outpatient medications and blood glucose control important in reducing the progression of atherosclerotic disease. Also, involved in wound healing. On appropriate medications. 4.  Hypertension.  Stable on outpatient medications and blood pressure control important in reducing the progression of atherosclerotic disease. On appropriate oral medications.    Leotis Pain, MD  12/16/2017 4:05 PM    This note was created with Dragon medical transcription system.  Any error is purely unintentional

## 2017-12-16 NOTE — Progress Notes (Signed)
Per MD okay for RN to change sliding scale insulin order.

## 2017-12-16 NOTE — Care Management (Addendum)
Patient is followed at [facility] in [place] MWF for dialysis Uses medicaid transportation.  No issues accessing medical care, obtaining medications. Able to perform her adls. Elvera Bicker with [facility] [employer] aware of admission

## 2017-12-16 NOTE — Progress Notes (Signed)
[facility] [place] NAME: [name]    MR#:  [id number]  DATE OF BIRTH:  03-03-1966  SUBJECTIVE:  CHIEF COMPLAINT:   Chief Complaint  Patient presents with  . Weakness    Came with abd and back pain, fluid overloaded. Felt much better after HD yesterday. Had some problem with her fistula and plan is for her fistulogram today. REVIEW OF SYSTEMS:  CONSTITUTIONAL: No fever, fatigue or weakness.  EYES: No blurred or double vision.  EARS, NOSE, AND THROAT: No tinnitus or ear pain.  RESPIRATORY: No cough, shortness of breath, wheezing or hemoptysis.  CARDIOVASCULAR: No chest pain, orthopnea, edema.  GASTROINTESTINAL: No nausea, vomiting, diarrhea or abdominal pain.  GENITOURINARY: No dysuria, hematuria.  ENDOCRINE: No polyuria, nocturia,  HEMATOLOGY: No anemia, easy bruising or bleeding SKIN: No rash or lesion. MUSCULOSKELETAL: No joint pain or arthritis.   NEUROLOGIC: No tingling, numbness, weakness.  PSYCHIATRY: No anxiety or depression.   ROS  DRUG ALLERGIES:   Allergies  Allergen Reactions  . Cinnamon Anaphylaxis  . Garlic Anaphylaxis and Hives  . Onion Hives and Swelling  . Tylenol [Acetaminophen] Anaphylaxis  . Ciprofloxacin Diarrhea  . Prednisone Other (See Comments)    Vaginal blisters & oral blisters    VITALS:  Blood pressure (!) 168/70, pulse 78, temperature 98 F (36.7 C), temperature source Oral, resp. rate 18, height 5\' 7"  (1.702 m), weight 96.6 kg (213 lb), SpO2 97 %.  PHYSICAL EXAMINATION:  GENERAL:  51 y.o.-year-old patient lying in the bed with no acute distress.  EYES: Pupils equal, round, reactive to light and accommodation. No scleral icterus. Extraocular muscles intact.  HEENT: Head atraumatic, normocephalic. Oropharynx and nasopharynx clear.  NECK:  Supple, no jugular venous distention. No thyroid enlargement, no tenderness.  LUNGS: Normal breath sounds bilaterally, no wheezing, rales,rhonchi or  crepitation. No use of accessory muscles of respiration.  CARDIOVASCULAR: S1, S2 normal. No murmurs, rubs, or gallops.  ABDOMEN: Soft, nontender, nondistended. Bowel sounds present. No organomegaly or mass.  EXTREMITIES: No pedal edema, cyanosis, or clubbing.  NEUROLOGIC: Cranial nerves II through XII are intact. Muscle strength 5/5 in all extremities. Sensation intact. Gait not checked.  PSYCHIATRIC: The patient is alert and oriented x 3.  SKIN: No obvious rash, lesion, or ulcer.   Physical Exam LABORATORY PANEL:   CBC Recent Labs  Lab 12/15/17 0353  WBC 6.5  HGB 12.3  HCT 36.0  PLT 162   ------------------------------------------------------------------------------------------------------------------  Chemistries  Recent Labs  Lab 12/14/17 2323  12/16/17 0352  NA 131*   < > 135  K 7.1*   < > 5.0  CL 98*   < > 96*  CO2 20*   < > 29  GLUCOSE 183*   < > 60*  BUN 76*   < > 44*  CREATININE 6.63*   < > 4.83*  CALCIUM 8.3*   < > 8.3*  MG 2.4  --   --   AST 23  --   --   ALT 16  --   --   ALKPHOS 60  --   --   BILITOT 0.9  --   --    < > = values in this interval not displayed.   ------------------------------------------------------------------------------------------------------------------  Cardiac Enzymes Recent Labs  Lab 12/14/17 2323  TROPONINI <0.03   ------------------------------------------------------------------------------------------------------------------  RADIOLOGY:  Ct Abdomen Pelvis Wo Contrast  Result Date: 12/15/2017 CLINICAL DATA:  51 y/o F; generalized weakness and right-sided abdominal pain. History  renal failure and hepatitis-C. EXAM: CT ABDOMEN AND PELVIS WITHOUT CONTRAST TECHNIQUE: Multidetector CT imaging of the abdomen and pelvis was performed following the standard protocol without IV contrast. COMPARISON:  11/20/2016 CT abdomen and pelvis. FINDINGS: Lower chest: Septal thickening at the lung bases probably representing interstitial  edema. Small left pleural effusion. Hepatobiliary: No focal liver abnormality is seen. Status post cholecystectomy. No biliary dilatation. Stable nonspecific perihepatic calcifications. Pancreas: Unremarkable. No pancreatic ductal dilatation or surrounding inflammatory changes. Spleen: Normal in size without focal abnormality. Adrenals/Urinary Tract: Normal adrenal glands. Stable 27 mm right interpolar kidney cyst. No other focal kidney lesion. No hydronephrosis. Normal bladder. Stomach/Bowel: Stomach is within normal limits. Appendix appears normal. No evidence of bowel wall thickening, distention, or inflammatory changes. Scattered sigmoid diverticulosis. Vascular/Lymphatic: Aortic atherosclerosis. No enlarged abdominal or pelvic lymph nodes. Reproductive: Uterus and bilateral adnexa are unremarkable. Other: No abdominal wall hernia or abnormality. No abdominopelvic ascites. Musculoskeletal: No acute or significant osseous findings. IMPRESSION: 1. No acute abdominal or pelvic process identified. 2. Interstitial pulmonary edema and small left pleural effusion. 3. Stable right kidney interpolar cyst. 4. Scattered sigmoid diverticulosis. 5. Aortic atherosclerosis. Electronically Signed   By: Kristine Garbe M.D.   On: 12/15/2017 00:27    ASSESSMENT AND PLAN:   Active Problems:   Acute respiratory failure ([place])   51 year old female with a known history of ESRD being admitted for acute hyperkalemia  *Acute hyperkalemia with EKG changes -Potassium was 7.1 -Patient was given calcium chloride with insulin and dextrose - Kayexalate - repeat potassium is down, nephro to help with HD>  *Abdominal pain and leg heaviness - likely due to fluid overload From lack of dialysis -CT of the abdomen was negative for any acute pathology  * HD access malfunction   Plan for fistulogram today.  *Hypertension -Continue Cardizem  *Neuropathy -Continue gabapentin  * Chronic back pain   Cont  oxycodone.  All the records are reviewed and case discussed with Care Management/Social Workerr. Management plans discussed with the patient, family and they are in agreement.  CODE STATUS: FUll.  TOTAL TIME TAKING CARE OF THIS PATIENT: 35 minutes.   POSSIBLE D/C IN 1-2 DAYS, DEPENDING ON CLINICAL CONDITION.   Vaughan Basta M.D on 12/16/2017   Between 7am to 6pm - Pager - [phone]  After 6pm go to [email or web address] - password [email or web address] [facility]  Office  [phone]  CC: Primary care physician; Mikey College, NP  Note: This dictation was prepared with Dragon dictation along with smaller phrase technology. Any transcriptional errors that result from this process are unintentional.

## 2017-12-16 NOTE — Op Note (Signed)
[facility]    OPERATIVE NOTE   PROCEDURE: 1.   Left brachiocephalic arteriovenous fistula cannulation under ultrasound guidance 2.   Left arm fistulagram including central venogram 3.   Percutaneous transluminal angioplasty of left mid upper arm cephalic vein stenosis with 7 mm diameter by 6 cm length angioplasty balloon 4.   Percutaneous transluminal angioplasty of the left cephalic vein subclavian vein confluence with 7 mm diameter by 6 cm length Lutonix drug-coated angioplasty balloon and 8 mm diameter by 6 cm length angioplasty balloon  PRE-OPERATIVE DIAGNOSIS: 1. ESRD 2. Poorly functional left brachiocephalic AVF  POST-OPERATIVE DIAGNOSIS: same as above   SURGEON: Leotis Pain, MD  ANESTHESIA: local with MCS  ESTIMATED BLOOD LOSS: 5 cc  FINDING(S): 1. 80-85% stenosis at the cephalic vein subclavian vein confluence, 65-70% stenosis in the mid upper arm cephalic vein between access sites, no other significant stenosis identified throughout the fistula from its anastomosis to the central venous circulation  SPECIMEN(S):  None  CONTRAST: 25 cc  FLUORO TIME: 1.5 minutes  MODERATE CONSCIOUS SEDATION TIME: Approximately 15 minutes with 3 mg of Versed and 75 Mcg of Fentanyl   INDICATIONS: [name] is a 51 y.o. female who presents with malfunctioning left brachiocephalic arteriovenous fistula.  Her nephrologist has reported high pressures and prolonged bleeding.  The patient is scheduled for left arm fistulagram.  The patient is aware the risks include but are not limited to: bleeding, infection, thrombosis of the cannulated access, and possible anaphylactic reaction to the contrast.  The patient is aware of the risks of the procedure and elects to proceed forward.  DESCRIPTION: After full informed written consent was obtained, the patient was brought back to the angiography suite and placed supine upon the angiography table.  The patient was connected to  monitoring equipment. Moderate conscious sedation was administered with a face to face encounter with the patient throughout the procedure with my supervision of the RN administering medicines and monitoring the patient's vital signs and mental status throughout from the start of the procedure until the patient was taken to the recovery room. The left arm was prepped and draped in the standard fashion for a percutaneous access intervention.  Under ultrasound guidance, the left brachiocephalic arteriovenous fistula was cannulated with a micropuncture needle under direct ultrasound guidance and a permanent image was performed.  The microwire was advanced into the fistula and the needle was exchanged for the a microsheath.  I then upsized to a 6 Fr Sheath and imaging was performed.  Hand injections were completed to image the access including the central venous system. This demonstrated 80-85% stenosis at the cephalic vein subclavian vein confluence, 65-70% stenosis in the mid upper arm cephalic vein between access sites, no other significant stenosis identified throughout the fistula from its anastomosis to the central venous circulation.  Based on the images, this patient will need intervention to these areas. I then gave the patient 3000 units of intravenous heparin.  I then crossed the stenoses with a Magic Tourqe wire.  Based on the imaging, a 7 mm x 6 cm Lutonix drug-coated angioplasty balloon was selected.  The balloon was centered around the cephalic vein and subclavian vein confluence stenosis and inflated to 14 ATM for 1 minute(s).  The balloon was then pulled back to the separate and distinct stenosis in the mid upper arm cephalic vein.  The balloon was inflated to 14 atm for 1 minute again.  On completion imaging, a 50 % residual  stenosis was present at the cephalic vein subclavian vein confluence and about a 20-25% stenosis was present in the mid upper arm cephalic vein.  I then upsized to an 8 mm  diameter by 6 cm angioplasty balloon for the cephalic vein subclavian vein confluence.  This was then inflated to 10 atm for 1 minute.  Completion angiogram following this showed about a 25% residual stenosis at the cephalic vein subclavian vein confluence.     Based on the completion imaging, no further intervention is necessary.  The wire and balloon were removed from the sheath.  A 4-0 Monocryl purse-string suture was sewn around the sheath.  The sheath was removed while tying down the suture.  A sterile bandage was applied to the puncture site.  COMPLICATIONS: None  CONDITION: Stable   Leotis Pain  12/16/2017 4:36 PM   This note was created with Dragon Medical transcription system. Any errors in dictation are purely unintentional.

## 2017-12-16 NOTE — Progress Notes (Addendum)
Inpatient Diabetes Program Recommendations  AACE/ADA: New Consensus Statement on Inpatient Glycemic Control (2015)  Target Ranges:  Prepandial:   less than 140 mg/dL      Peak postprandial:   less than 180 mg/dL (1-2 hours)      Critically ill patients:  140 - 180 mg/dL   Lab Results  Component Value Date   GLUCAP 118 (H) 12/16/2017   HGBA1C 8.3 07/12/2017    Review of Glycemic Control  Results for [name], [name] (MRN [id number]) as of 12/16/2017 11:14  Ref. Range 12/15/2017 15:46 12/15/2017 21:13 12/16/2017 04:07 12/16/2017 04:49 12/16/2017 07:32  Glucose-Capillary Latest Ref Range: 65 - 99 mg/dL 159 (H) 169 (H) 62 (L) 96 118 (H)    Diabetes history: Type 2 Outpatient Diabetes medications: Lantus 15 units/day, Novolin R 3-5 units tid   Current orders for Inpatient glycemic control: Lantus 15 units q day, Novolog 0-15 units tid  Inpatient Diabetes Program Recommendations:Low blood sugar at 5am today- renal function poor.  Please consider decreasing Novolog correction to sensitive scale 0-9 units tid and decrease Lantus to 13 units qday, starting tomorrow.   Per ADA recommendations "consider performing an A1C on all patients with diabetes or hyperglycemia admitted to the hospital if not performed in the prior 3 months".  Dr. Anselm Jungling text paged at 11:29am  Discussed my concerns regarding potential low blood sugar with [email or web address] Fitz, RN, BA, MHA, CDE Diabetes Coordinator Inpatient Diabetes Program  [phone] (Team Pager) [phone] ([place]) 12/16/2017 11:26 AM

## 2017-12-16 NOTE — Progress Notes (Signed)
[facility] [place] NAME: [name]    MR#:  [id number]  DATE OF BIRTH:  [birth year]  SUBJECTIVE:  CHIEF COMPLAINT:   Chief Complaint  Patient presents with  . Weakness    Came with abd and back pain, fluid overloaded. Seen during HD, felt better. REVIEW OF SYSTEMS:  CONSTITUTIONAL: No fever, fatigue or weakness.  EYES: No blurred or double vision.  EARS, NOSE, AND THROAT: No tinnitus or ear pain.  RESPIRATORY: No cough, shortness of breath, wheezing or hemoptysis.  CARDIOVASCULAR: No chest pain, orthopnea, edema.  GASTROINTESTINAL: No nausea, vomiting, diarrhea or abdominal pain.  GENITOURINARY: No dysuria, hematuria.  ENDOCRINE: No polyuria, nocturia,  HEMATOLOGY: No anemia, easy bruising or bleeding SKIN: No rash or lesion. MUSCULOSKELETAL: No joint pain or arthritis.   NEUROLOGIC: No tingling, numbness, weakness.  PSYCHIATRY: No anxiety or depression.   ROS  DRUG ALLERGIES:   Allergies  Allergen Reactions  . Cinnamon Anaphylaxis  . Garlic Anaphylaxis and Hives  . Onion Hives and Swelling  . Tylenol [Acetaminophen] Anaphylaxis  . Ciprofloxacin Diarrhea  . Prednisone Other (See Comments)    Vaginal blisters & oral blisters    VITALS:  Blood pressure (!) 154/66, pulse 73, temperature (!) 97.5 F (36.4 C), temperature source Oral, resp. rate 18, height 5\' 7"  (1.702 m), weight 96.8 kg (213 lb 6.5 oz), SpO2 98 %.  PHYSICAL EXAMINATION:  GENERAL:  51 y.o.-year-old patient lying in the bed with no acute distress.  EYES: Pupils equal, round, reactive to light and accommodation. No scleral icterus. Extraocular muscles intact.  HEENT: Head atraumatic, normocephalic. Oropharynx and nasopharynx clear.  NECK:  Supple, no jugular venous distention. No thyroid enlargement, no tenderness.  LUNGS: Normal breath sounds bilaterally, no wheezing, rales,rhonchi or crepitation. No use of accessory muscles of respiration.  CARDIOVASCULAR:  S1, S2 normal. No murmurs, rubs, or gallops.  ABDOMEN: Soft, nontender, nondistended. Bowel sounds present. No organomegaly or mass.  EXTREMITIES: No pedal edema, cyanosis, or clubbing.  NEUROLOGIC: Cranial nerves II through XII are intact. Muscle strength 5/5 in all extremities. Sensation intact. Gait not checked.  PSYCHIATRIC: The patient is alert and oriented x 3.  SKIN: No obvious rash, lesion, or ulcer.   Physical Exam LABORATORY PANEL:   CBC Recent Labs  Lab 12/15/17 0353  WBC 6.5  HGB 12.3  HCT 36.0  PLT 162   ------------------------------------------------------------------------------------------------------------------  Chemistries  Recent Labs  Lab 12/14/17 2323  12/16/17 0352  NA 131*   < > 135  K 7.1*   < > 5.0  CL 98*   < > 96*  CO2 20*   < > 29  GLUCOSE 183*   < > 60*  BUN 76*   < > 44*  CREATININE 6.63*   < > 4.83*  CALCIUM 8.3*   < > 8.3*  MG 2.4  --   --   AST 23  --   --   ALT 16  --   --   ALKPHOS 60  --   --   BILITOT 0.9  --   --    < > = values in this interval not displayed.   ------------------------------------------------------------------------------------------------------------------  Cardiac Enzymes Recent Labs  Lab 12/14/17 2323  TROPONINI <0.03   ------------------------------------------------------------------------------------------------------------------  RADIOLOGY:  Ct Abdomen Pelvis Wo Contrast  Result Date: 12/15/2017 CLINICAL DATA:  51 y/o F; generalized weakness and right-sided abdominal pain. History renal failure and hepatitis-C. EXAM: CT ABDOMEN AND PELVIS WITHOUT CONTRAST  TECHNIQUE: Multidetector CT imaging of the abdomen and pelvis was performed following the standard protocol without IV contrast. COMPARISON:  11/20/2016 CT abdomen and pelvis. FINDINGS: Lower chest: Septal thickening at the lung bases probably representing interstitial edema. Small left pleural effusion. Hepatobiliary: No focal liver abnormality  is seen. Status post cholecystectomy. No biliary dilatation. Stable nonspecific perihepatic calcifications. Pancreas: Unremarkable. No pancreatic ductal dilatation or surrounding inflammatory changes. Spleen: Normal in size without focal abnormality. Adrenals/Urinary Tract: Normal adrenal glands. Stable 27 mm right interpolar kidney cyst. No other focal kidney lesion. No hydronephrosis. Normal bladder. Stomach/Bowel: Stomach is within normal limits. Appendix appears normal. No evidence of bowel wall thickening, distention, or inflammatory changes. Scattered sigmoid diverticulosis. Vascular/Lymphatic: Aortic atherosclerosis. No enlarged abdominal or pelvic lymph nodes. Reproductive: Uterus and bilateral adnexa are unremarkable. Other: No abdominal wall hernia or abnormality. No abdominopelvic ascites. Musculoskeletal: No acute or significant osseous findings. IMPRESSION: 1. No acute abdominal or pelvic process identified. 2. Interstitial pulmonary edema and small left pleural effusion. 3. Stable right kidney interpolar cyst. 4. Scattered sigmoid diverticulosis. 5. Aortic atherosclerosis. Electronically Signed   By: Kristine Garbe M.D.   On: 12/15/2017 00:27    ASSESSMENT AND PLAN:   Active Problems:   Acute respiratory failure ([place])   51 year old female with a known history of ESRD being admitted for acute hyperkalemia  *Acute hyperkalemia with EKG changes -Potassium was 7.1 -Patient was given calcium chloride with insulin and dextrose - Kayexalate - repeat potassium is down, nephro to help with HD>  *Abdominal pain and leg heaviness - likely due to fluid overload From lack of dialysis -CT of the abdomen was negative for any acute pathology  *Hypertension -Continue Cardizem  *Neuropathy -Continue gabapentin    All the records are reviewed and case discussed with Care Management/Social Workerr. Management plans discussed with the patient, family and they are in  agreement.  CODE STATUS: FUll.  TOTAL TIME TAKING CARE OF THIS PATIENT: 35 minutes.     POSSIBLE D/C IN 1-2 DAYS, DEPENDING ON CLINICAL CONDITION.   Vaughan Basta M.D on 12/16/2017   Between 7am to 6pm - Pager - [phone]  After 6pm go to [email or web address] - password [email or web address]  Office  [phone]  CC: Primary care physician; Mikey College, NP  Note: This dictation was prepared with Dragon dictation along with smaller phrase technology. Any transcriptional errors that result from this process are unintentional.

## 2017-12-16 NOTE — Progress Notes (Signed)
Patient's felt clammy and cold and asked for her glucose levels to be checked.  First result was 62, gave apple sauce and 3 glucose tablets; glucose 92 after recheck.

## 2017-12-16 NOTE — Progress Notes (Signed)
[facility], [place] 12/16/17  Subjective:   Patient feels well today Awaiting angioplasty Potassium corrected  Objective:  Vital signs in last 24 hours:  Temp:  [97.5 F (36.4 C)-98.8 F (37.1 C)] 97.8 F (36.6 C) (12/27 1728) Pulse Rate:  [68-99] 74 (12/27 1728) Resp:  [15-20] 18 (12/27 1728) BP: (140-185)/(63-81) 164/63 (12/27 1728) SpO2:  [96 %-100 %] 100 % (12/27 1728) Weight:  [96.6 kg (213 lb)] 96.6 kg (213 lb) (12/27 1536)  Weight change: 1.237 kg (2 lb 11.7 oz) Filed Weights   12/15/17 0902 12/15/17 1511 12/16/17 1536  Weight: 97.4 kg (214 lb 11.7 oz) 96.8 kg (213 lb 6.5 oz) 96.6 kg (213 lb)    Intake/Output:    Intake/Output Summary (Last 24 hours) at 12/16/2017 1729 Last data filed at 12/16/2017 0559 Gross per 24 hour  Intake 420 ml  Output 600 ml  Net -180 ml     Physical Exam: General:  No acute distress, laying in the bed  HEENT  anicteric, moist oral mucous membranes  Neck  supple  Pulm/lungs  lungs are clear to auscultation  CVS/Heart  no rub  Abdomen:   Soft, nontender  Extremities:  No edema  Neurologic:  Alert, oriented  Skin:  No acute rashes  Access:  Left upper arm AV fistula       Basic Metabolic Panel:  Recent Labs  Lab 12/14/17 2323 12/15/17 0353 12/15/17 0614 12/15/17 1357 12/15/17 1450 12/16/17 0352  NA 131* 134*  --   --   --  135  K 7.1* 5.6* 5.7* 3.7 3.5 5.0  CL 98* 98*  --   --   --  96*  CO2 20* 21*  --   --   --  29  GLUCOSE 183* 213*  --   --   --  60*  BUN 76* 75*  --   --   --  44*  CREATININE 6.63* 7.05*  --   --   --  4.83*  CALCIUM 8.3* 8.8*  --   --   --  8.3*  MG 2.4  --   --   --   --   --      CBC: Recent Labs  Lab 12/14/17 2323 12/15/17 0353  WBC 8.5 6.5  HGB 12.9 12.3  HCT 38.5 36.0  MCV 98.9 98.3  PLT 191 162      Lab Results  Component Value Date   HEPBSAG Negative 01/08/2017   HEPBSAB Non Reactive 01/08/2017      Microbiology:  Recent Results  (from the past 240 hour(s))  MRSA PCR Screening     Status: None   Collection Time: 12/15/17  6:36 AM  Result Value Ref Range Status   MRSA by PCR NEGATIVE NEGATIVE Final    Comment:        The GeneXpert MRSA Assay (FDA approved for NASAL specimens only), is one component of a comprehensive MRSA colonization surveillance program. It is not intended to diagnose MRSA infection nor to guide or monitor treatment for MRSA infections. Performed at [facility], [address] [place], [place] [address]     Coagulation Studies: No results for input(s): LABPROT, INR in the last 72 hours.  Urinalysis: No results for input(s): COLORURINE, LABSPEC, PHURINE, GLUCOSEU, HGBUR, BILIRUBINUR, KETONESUR, PROTEINUR, UROBILINOGEN, NITRITE, LEUKOCYTESUR in the last 72 hours.  Invalid input(s): APPERANCEUR    Imaging: Ct Abdomen Pelvis Wo Contrast  Result Date: 12/15/2017 CLINICAL DATA:  51 y/o  F; generalized weakness and right-sided abdominal pain. History renal failure and hepatitis-C. EXAM: CT ABDOMEN AND PELVIS WITHOUT CONTRAST TECHNIQUE: Multidetector CT imaging of the abdomen and pelvis was performed following the standard protocol without IV contrast. COMPARISON:  11/20/2016 CT abdomen and pelvis. FINDINGS: Lower chest: Septal thickening at the lung bases probably representing interstitial edema. Small left pleural effusion. Hepatobiliary: No focal liver abnormality is seen. Status post cholecystectomy. No biliary dilatation. Stable nonspecific perihepatic calcifications. Pancreas: Unremarkable. No pancreatic ductal dilatation or surrounding inflammatory changes. Spleen: Normal in size without focal abnormality. Adrenals/Urinary Tract: Normal adrenal glands. Stable 27 mm right interpolar kidney cyst. No other focal kidney lesion. No hydronephrosis. Normal bladder. Stomach/Bowel: Stomach is within normal limits. Appendix appears normal. No evidence of bowel wall thickening, distention, or  inflammatory changes. Scattered sigmoid diverticulosis. Vascular/Lymphatic: Aortic atherosclerosis. No enlarged abdominal or pelvic lymph nodes. Reproductive: Uterus and bilateral adnexa are unremarkable. Other: No abdominal wall hernia or abnormality. No abdominopelvic ascites. Musculoskeletal: No acute or significant osseous findings. IMPRESSION: 1. No acute abdominal or pelvic process identified. 2. Interstitial pulmonary edema and small left pleural effusion. 3. Stable right kidney interpolar cyst. 4. Scattered sigmoid diverticulosis. 5. Aortic atherosclerosis. Electronically Signed   By: Kristine Garbe M.D.   On: 12/15/2017 00:27     Medications:   . sodium chloride     . calcium acetate  1,334 mg Oral TID WC  . diltiazem  240 mg Oral BID  . docusate sodium  100 mg Oral BID  . Glecaprevir-Pibrentasvir  3 tablet Oral Daily  . heparin  5,000 Units Subcutaneous Q8H  . insulin aspart  0-9 Units Subcutaneous TID WC  . insulin glargine  15 Units Subcutaneous Daily  . multivitamin  1 tablet Oral QHS  . oxyCODONE      . sevelamer carbonate  1,600 mg Oral TID WC  . sodium chloride flush  3 mL Intravenous Q12H   sodium chloride, bisacodyl, diphenhydrAMINE, glucose, midodrine, ondansetron **OR** ondansetron (ZOFRAN) IV, oxyCODONE, promethazine, sodium chloride flush, traZODone  Assessment/ Plan:  51 y.o. female with medical problems of hypertension, diastolic heart failure, diabetes mellitus type 2, and anemia of chronic kidney disease, hepatitis C, depression, psoriasis, peripheral neuropathy, ESRD, history of left foot blister/LLE cellulitis   [facility],MWF  1.  End-stage renal disease 2.  Severe hyperkalemia 3.  Anemia of chronic kidney disease, hemoglobin 12.3 4.  Secondary hyperparathyroidism  Electrolytes and volume status are acceptable Awaiting angioplasty Routine hemodialysis tomorrow      LOS: 1 [name] 12/27/20185:29 PM  [facility] [place], [place]

## 2017-12-17 ENCOUNTER — Encounter: Payer: Self-pay | Admitting: Vascular Surgery

## 2017-12-17 LAB — GLUCOSE, CAPILLARY
GLUCOSE-CAPILLARY: 92 mg/dL (ref 65–99)
GLUCOSE-CAPILLARY: 76 mg/dL (ref 65–99)
GLUCOSE-CAPILLARY: 131 mg/dL — AB (ref 65–99)
GLUCOSE-CAPILLARY: 110 mg/dL — AB (ref 65–99)
Glucose-Capillary: 132 mg/dL — ABNORMAL HIGH (ref 65–99)
Glucose-Capillary: 112 mg/dL — ABNORMAL HIGH (ref 65–99)

## 2017-12-17 NOTE — Progress Notes (Signed)
Per MD okay for RN to put PT consult.

## 2017-12-17 NOTE — Evaluation (Signed)
Physical Therapy Evaluation Patient Details Name: [name] MRN: [id number] DOB: Mar 31, 1966 Today's Date: 12/17/2017   History of Present Illness  51 yo Female came to ED with progressive weakness and LUE fistula problems. Patient has ESRD and is on dialysis. She has been having trouble at her dialysis port site in her UE; PMH significant for HTN and CHF; She denies any recent falls;   Clinical Impression  51 yo Female reports increased weakness in UE/LE over Christmas. She reports having pain in UE and states that she isn't sure if it is from her bulging disc in neck or from something else. However, on Christmas she was unable to raise her arms overhead and was unable to get up out of the chair. She immediately came to ED. Patient was diagnosed with weakness related to ESRD and LUE fistula; Patient reports needing assistance for showers intermittently prior to admittance but was otherwise mod I for self care ADLs. She would use a cane/RW for ambulation; Currently she is mod I for bed mobility; She requires supervision for sit<>Stand transfers using RW. Patient ambulated 80 feet with RW with CGA for safety exhibiting decreased step length and slower gait speed; She exhibits fair balance in standing. Her LE weakness is significant at 3/5 to 3+/5; Patient would benefit from skilled PT intervention to improve strength, balance and gait safety; Recommend home health PT upon discharge to continue addressing goals; Patient agreeable.     Follow Up Recommendations Home health PT    Equipment Recommendations  None recommended by PT    Recommendations for Other Services Rehab consult     Precautions / Restrictions Precautions Precautions: Fall Restrictions Weight Bearing Restrictions: No      Mobility  Bed Mobility Overal bed mobility: Modified Independent             General bed mobility comments: uses bed rails but exhibits good safety awareness when getting in/out of bed;    Transfers Overall transfer level: Needs assistance Equipment used: Rolling walker (2 wheeled) Transfers: Sit to/from Stand Sit to Stand: Supervision         General transfer comment: from bed and regular height surface; requires min VCs for hand placement for safety;   Ambulation/Gait Ambulation/Gait assistance: Min guard Ambulation Distance (Feet): 80 Feet Assistive device: Rolling walker (2 wheeled) Gait Pattern/deviations: Step-through pattern;Decreased step length - right;Decreased step length - left;Shuffle;Decreased dorsiflexion - right;Decreased dorsiflexion - left;Wide base of support Gait velocity: decreased   General Gait Details: slower gait speed; reciprocal gait pattern but decreased step length;   Stairs            Wheelchair Mobility    Modified Rankin (Stroke Patients Only)       Balance Overall balance assessment: Needs assistance Sitting-balance support: No upper extremity supported;Feet supported Sitting balance-Leahy Scale: Good     Standing balance support: Bilateral upper extremity supported Standing balance-Leahy Scale: Fair Standing balance comment: uses RW for balance; able to stand short time without RW but is unsteady;                              Pertinent Vitals/Pain Pain Assessment: 0-10 Pain Score: 9  Pain Location: head, neck and UE Pain Descriptors / Indicators: Aching;Sore;Throbbing Pain Intervention(s): Limited activity within patient's tolerance;Monitored during session;Premedicated before session;Repositioned    Home Living Family/patient expects to be discharged to:: Private residence Living Arrangements: Spouse/significant other Available Help at Discharge: Family(boyfriend) Type of  Home: Apartment Home Access: Level entry     Home Layout: One level Home Equipment: [place] - 2 wheels;Cane - single point      Prior Function Level of Independence: Independent with assistive device(s)          Comments: would use cane/RW at times during ambulation; does need help with shower occasionally; Otherwise reports being mod I for self care ADLs;      Hand Dominance        Extremity/Trunk Assessment   Upper Extremity Assessment Upper Extremity Assessment: Overall WFL for tasks assessed    Lower Extremity Assessment Lower Extremity Assessment: LLE deficits/detail;RLE deficits/detail RLE Deficits / Details: hip grossly 3/5, knee 3+/5 ankle 3+/5; does have numbness/tingling but intact light touch sensation throughout LE except toes;  RLE Sensation: history of peripheral neuropathy LLE Deficits / Details: hip grossly 3/5, knee 3+/5 ankle 3+/5; does have numbness/tingling but intact light touch sensation throughout LE except toes;  LLE Sensation: history of peripheral neuropathy    Cervical / Trunk Assessment Cervical / Trunk Assessment: Kyphotic  Communication   Communication: No difficulties  Cognition Arousal/Alertness: Awake/alert Behavior During Therapy: WFL for tasks assessed/performed Overall Cognitive Status: Within Functional Limits for tasks assessed                                        General Comments General comments (skin integrity, edema, etc.): has bruising in LE; bandages on UE from incision site/IV    Exercises Other Exercises Other Exercises: Instructed patient in seated LAQ bilaterally x10 reps to facilitate better circulation and knee control/strengthening;    Assessment/Plan    PT Assessment Patient needs continued PT services  PT Problem List Decreased strength;Decreased mobility;Decreased safety awareness;Decreased activity tolerance;Decreased knowledge of use of DME;Decreased balance;Obesity       PT Treatment Interventions DME instruction;Therapeutic activities;Gait training;Therapeutic exercise;Patient/family education;Balance training;Functional mobility training;Neuromuscular re-education    PT Goals (Current goals can be  found in the Care Plan section)  Acute Rehab PT Goals Patient Stated Goal: "I want to get stronger"  PT Goal Formulation: With patient Time For Goal Achievement: 12/31/17 Potential to Achieve Goals: Good    Frequency Min 2X/week   Barriers to discharge   None    Co-evaluation               AM-PAC PT "6 Clicks" Daily Activity  Outcome Measure Difficulty turning over in bed (including adjusting bedclothes, sheets and blankets)?: A Little Difficulty moving from lying on back to sitting on the side of the bed? : A Little Difficulty sitting down on and standing up from a chair with arms (e.g., wheelchair, bedside commode, etc,.)?: A Little Help needed moving to and from a bed to chair (including a wheelchair)?: A Little Help needed walking in hospital room?: A Little Help needed climbing 3-5 steps with a railing? : A Lot 6 Click Score: 17    End of Session Equipment Utilized During Treatment: Gait belt Activity Tolerance: Patient tolerated treatment well;No increased pain Patient left: in bed;with bed alarm set;with call bell/phone within reach Nurse Communication: Mobility status;Other (comment)(PT recommendations of HH PT) PT Visit Diagnosis: Unsteadiness on feet (R26.81);Muscle weakness (generalized) (M62.81)    Time: 9242-6834 PT Time Calculation (min) (ACUTE ONLY): 26 min   Charges:   PT Evaluation $PT Eval Low Complexity: 1 Low     PT G Codes:   PT G-Codes **NOT  FOR INPATIENT CLASS** Functional Assessment Tool Used: AM-PAC 6 Clicks Basic Mobility Functional Limitation: Mobility: Walking and moving around Mobility: Walking and Moving Around Current Status [phone]): At least 40 percent but less than 60 percent impaired, limited or restricted Mobility: Walking and Moving Around Goal Status [phone]): At least 20 percent but less than 40 percent impaired, limited or restricted      Tennyson Kallen PT, DPT 12/17/2017, 3:38 PM

## 2017-12-17 NOTE — Progress Notes (Signed)
[place], [place] 12/17/17  Subjective:   Patient seen during dialysis treatment.  Patient had intradialytic drop in blood pressure.  Ultrafiltration was turned off.  Patient was given bolus of 700 cc in increments.  She responded well.  Blood pressure improved.  However, she still continues to feel poorly today.    HEMODIALYSIS FLOWSHEET:  Blood Flow Rate (mL/min): 300 mL/min Arterial Pressure (mmHg): -100 mmHg Venous Pressure (mmHg): 160 mmHg Transmembrane Pressure (mmHg): 50 mmHg Ultrafiltration Rate (mL/min): 50 mL/min Dialysate Flow Rate (mL/min): 800 ml/min Conductivity: Machine : 14.2 Conductivity: Machine : 14.2 Dialysis Fluid Bolus: Normal Saline Bolus Amount (mL): 700 mL  Continued, last delivered somewhere else  Objective:  Vital signs in last 24 hours:  Temp:  [97.7 F (36.5 C)-98.4 F (36.9 C)] 98.4 F (36.9 C) (12/28 0940) Pulse Rate:  [68-78] 74 (12/28 1200) Resp:  [10-24] 14 (12/28 1200) BP: (115-179)/(55-99) 140/61 (12/28 1200) SpO2:  [94 %-100 %] 98 % (12/28 1200) Weight:  [96.6 kg (213 lb)-101.1 kg (222 lb 14.2 oz)] 101.1 kg (222 lb 14.2 oz) (12/28 0940)  Weight change: -0.784 kg (-11.7 oz) Filed Weights   12/15/17 1511 12/16/17 1536 12/17/17 0940  Weight: 96.8 kg (213 lb 6.5 oz) 96.6 kg (213 lb) 101.1 kg (222 lb 14.2 oz)    Intake/Output:    Intake/Output Summary (Last 24 hours) at 12/17/2017 1218 Last data filed at 12/17/2017 2355 Gross per 24 hour  Intake 0 ml  Output 750 ml  Net -750 ml     Physical Exam: General:  No acute distress, laying in the bed  HEENT  anicteric, moist oral mucous membranes  Neck  supple  Pulm/lungs  lungs are clear to auscultation  CVS/Heart  no rub  Abdomen:   Soft, nontender  Extremities:  No edema  Neurologic:  Alert, oriented  Skin:  No acute rashes  Access:  Left upper arm AV fistula       Basic Metabolic Panel:  Recent Labs  Lab 12/14/17 2323 12/15/17 0353  12/15/17 0614 12/15/17 1357 12/15/17 1450 12/16/17 0352  NA 131* 134*  --   --   --  135  K 7.1* 5.6* 5.7* 3.7 3.5 5.0  CL 98* 98*  --   --   --  96*  CO2 20* 21*  --   --   --  29  GLUCOSE 183* 213*  --   --   --  60*  BUN 76* 75*  --   --   --  44*  CREATININE 6.63* 7.05*  --   --   --  4.83*  CALCIUM 8.3* 8.8*  --   --   --  8.3*  MG 2.4  --   --   --   --   --      CBC: Recent Labs  Lab 12/14/17 2323 12/15/17 0353  WBC 8.5 6.5  HGB 12.9 12.3  HCT 38.5 36.0  MCV 98.9 98.3  PLT 191 162      Lab Results  Component Value Date   HEPBSAG Negative 01/08/2017   HEPBSAB Non Reactive 01/08/2017      Microbiology:  Recent Results (from the past 240 hour(s))  MRSA PCR Screening     Status: None   Collection Time: 12/15/17  6:36 AM  Result Value Ref Range Status   MRSA by PCR NEGATIVE NEGATIVE Final    Comment:        The GeneXpert MRSA Assay (FDA approved  for NASAL specimens only), is one component of a comprehensive MRSA colonization surveillance program. It is not intended to diagnose MRSA infection nor to guide or monitor treatment for MRSA infections. Performed at [facility], [address] [place],  [address]     Coagulation Studies: No results for input(s): LABPROT, INR in the last 72 hours.  Urinalysis: No results for input(s): COLORURINE, LABSPEC, PHURINE, GLUCOSEU, HGBUR, BILIRUBINUR, KETONESUR, PROTEINUR, UROBILINOGEN, NITRITE, LEUKOCYTESUR in the last 72 hours.  Invalid input(s): APPERANCEUR    Imaging: No results found.   Medications:   . sodium chloride     . calcium acetate  1,334 mg Oral TID WC  . diltiazem  240 mg Oral BID  . docusate sodium  100 mg Oral BID  . Glecaprevir-Pibrentasvir  3 tablet Oral Daily  . heparin  5,000 Units Subcutaneous Q8H  . insulin aspart  0-9 Units Subcutaneous TID WC  . insulin glargine  15 Units Subcutaneous Daily  . multivitamin  1 tablet Oral QHS  . sevelamer carbonate  1,600  mg Oral TID WC  . sodium chloride flush  3 mL Intravenous Q12H   sodium chloride, bisacodyl, diphenhydrAMINE, glucose, midodrine, ondansetron **OR** ondansetron (ZOFRAN) IV, oxyCODONE, promethazine, sodium chloride flush, traZODone  Assessment/ Plan:  51 y.o. female with medical problems of hypertension, diastolic heart failure, diabetes mellitus type 2, and anemia of chronic kidney disease, hepatitis C, depression, psoriasis, peripheral neuropathy, ESRD, history of left foot blister/LLE cellulitis   [facility]  1.  End-stage renal disease 2.  Severe hyperkalemia 3.  Anemia of chronic kidney disease, hemoglobin 12.3 4.  Secondary hyperparathyroidism  Continue HD  No UF today Hgb acceptable Continue home dose of binders      LOS: 2 Han [place]ne 12/28/201812:18 PM  [place], [place]

## 2017-12-17 NOTE — Progress Notes (Signed)
[place] [facility] [place] NAME: [name]    MR#:  [id number]  DATE OF BIRTH:  06-07-1966  SUBJECTIVE:  CHIEF COMPLAINT:   Chief Complaint  Wilson presents with  . Weakness    Came with abd and back pain, fluid overloaded. Had fistulogram done yesterday- today in HD BP dropped, ultrafiltration stopped and 700 cc fluids given, responded and BP stable. Cont to feel poorly. C/o pains all over. REVIEW OF SYSTEMS:  CONSTITUTIONAL: No fever, positive for fatigue or weakness.  EYES: No blurred or double vision.  EARS, NOSE, AND THROAT: No tinnitus or ear pain.  RESPIRATORY: No cough, shortness of breath, wheezing or hemoptysis.  CARDIOVASCULAR: No chest pain, orthopnea, edema.  GASTROINTESTINAL: No nausea, vomiting, diarrhea or abdominal pain.  GENITOURINARY: No dysuria, hematuria.  ENDOCRINE: No polyuria, nocturia,  HEMATOLOGY: No anemia, easy bruising or bleeding SKIN: No rash or lesion. MUSCULOSKELETAL: No joint pain or arthritis.   NEUROLOGIC: No tingling, numbness, weakness.  PSYCHIATRY: No anxiety or depression.   ROS  DRUG ALLERGIES:   Allergies  Allergen Reactions  . Cinnamon Anaphylaxis  . Garlic Anaphylaxis and Hives  . Onion Hives and Swelling  . Tylenol [Acetaminophen] Anaphylaxis  . Ciprofloxacin Diarrhea  . Prednisone Other (See Comments)    Vaginal blisters & oral blisters    VITALS:  Blood pressure (!) 162/63, pulse 80, temperature 99 F (37.2 C), temperature source Oral, resp. rate 11, height 5\' 7"  (1.702 m), weight 101.1 kg (222 lb 14.2 oz), SpO2 96 %.  PHYSICAL EXAMINATION:  GENERAL:  51 y.o.-year-old Wilson lying in the bed with no acute distress.  EYES: Pupils equal, round, reactive to light and accommodation. No scleral icterus. Extraocular muscles intact.  HEENT: Head atraumatic, normocephalic. Oropharynx and nasopharynx clear.  NECK:  Supple, no jugular venous distention. No thyroid enlargement, no  tenderness.  LUNGS: Normal breath sounds bilaterally, no wheezing, rales,rhonchi or crepitation. No use of accessory muscles of respiration.  CARDIOVASCULAR: S1, S2 normal. No murmurs, rubs, or gallops.  ABDOMEN: Soft, nontender, nondistended. Bowel sounds present. No organomegaly or mass.  EXTREMITIES: No pedal edema, cyanosis, or clubbing.  NEUROLOGIC: Cranial nerves II through XII are intact. Muscle strength 4- 5/5 in all extremities. Sensation intact. Gait not checked.  PSYCHIATRIC: The Wilson is alert and oriented x 3.  SKIN: No obvious rash, lesion, or ulcer.   Physical Exam LABORATORY PANEL:   CBC Recent Labs  Lab 12/15/17 0353  WBC 6.5  HGB 12.3  HCT 36.0  PLT 162   ------------------------------------------------------------------------------------------------------------------  Chemistries  Recent Labs  Lab 12/14/17 2323  12/16/17 0352  NA 131*   < > 135  K 7.1*   < > 5.0  CL 98*   < > 96*  CO2 20*   < > 29  GLUCOSE 183*   < > 60*  BUN 76*   < > 44*  CREATININE 6.63*   < > 4.83*  CALCIUM 8.3*   < > 8.3*  MG 2.4  --   --   AST 23  --   --   ALT 16  --   --   ALKPHOS 60  --   --   BILITOT 0.9  --   --    < > = values in this interval not displayed.   ------------------------------------------------------------------------------------------------------------------  Cardiac Enzymes Recent Labs  Lab 12/14/17 2323  TROPONINI <0.03   ------------------------------------------------------------------------------------------------------------------  RADIOLOGY:  No results found.  ASSESSMENT AND PLAN:  Active Problems:   Acute respiratory failure ([place])   51 year old female with a known history of ESRD being admitted for acute hyperkalemia  *Acute hyperkalemia with EKG changes -Potassium was 7.1 -Wilson was given calcium chloride with insulin and dextrose - Kayexalate - repeat potassium is down, nephro to help with HD>  *Abdominal pain and leg  heaviness - likely due to fluid overload From lack of dialysis -CT of the abdomen was negative for any acute pathology - PT eval.  * HD access malfunction   S/p fistulogram and ballooning.  * hypotension during HD 12/17/17   Feeling very fatigued and have aches and pains all over.   Stopped HD and fluids given.   Monitor today and follow labs, canceled discharged ( as it was planned earlier)  *Hypertension -Continue Cardizem  *Neuropathy -Continue gabapentin  * Chronic back pain   Cont oxycodone.  All the records are reviewed and case discussed with Care Management/Social Workerr. Management plans discussed with the Wilson, family and they are in agreement.  CODE STATUS: FUll.  TOTAL TIME TAKING CARE OF THIS Wilson: 35 minutes.   POSSIBLE D/C IN 1-2 DAYS, DEPENDING ON CLINICAL CONDITION.   Vaughan Basta M.D on 12/17/2017   Between 7am to 6pm - Pager - [phone]  After 6pm go to [email or web address] - password [email or web address] [facility]  Office  [phone]  CC: Primary care physician; Mikey College, NP  Note: This dictation was prepared with Dragon dictation along with smaller phrase technology. Any transcriptional errors that result from this process are unintentional.

## 2017-12-17 NOTE — Progress Notes (Signed)
Pre HD  

## 2017-12-17 NOTE — Progress Notes (Signed)
HD completed

## 2017-12-17 NOTE — Care Management (Signed)
Patient admitted from home with hyperkalemia.  Patient lives at home with spouse. PCP Merrilyn Puma.  PT has assessed patient and recommended home health PT.  Patient agreeable to services.  Patient states that she has used [facility], and would like to use them again.  Heads up referral made to [facility] with [place].  Patient has RW and cane in the home.

## 2017-12-17 NOTE — Progress Notes (Signed)
Post HD  

## 2017-12-17 NOTE — Progress Notes (Signed)
HD start 

## 2017-12-18 LAB — BASIC METABOLIC PANEL
Anion gap: 8 (ref 5–15)
BUN: 43 mg/dL — AB (ref 6–20)
CALCIUM: 7.9 mg/dL — AB (ref 8.9–10.3)
CHLORIDE: 97 mmol/L — AB (ref 101–111)
CO2: 29 mmol/L (ref 22–32)
CREATININE: 4.49 mg/dL — AB (ref 0.44–1.00)
GFR calc Af Amer: 12 mL/min — ABNORMAL LOW (ref >60)
GFR calc non Af Amer: 10 mL/min — ABNORMAL LOW (ref >60)
GLUCOSE: 60 mg/dL — AB (ref 65–99)
Potassium: 5.4 mmol/L — ABNORMAL HIGH (ref 3.5–5.1)
Sodium: 134 mmol/L — ABNORMAL LOW (ref 135–145)

## 2017-12-18 LAB — GLUCOSE, CAPILLARY
GLUCOSE-CAPILLARY: 56 mg/dL — AB (ref 65–99)
GLUCOSE-CAPILLARY: 158 mg/dL — AB (ref 65–99)
Glucose-Capillary: 143 mg/dL — ABNORMAL HIGH (ref 65–99)
Glucose-Capillary: 62 mg/dL — ABNORMAL LOW (ref 65–99)
Glucose-Capillary: 103 mg/dL — ABNORMAL HIGH (ref 65–99)
Glucose-Capillary: 126 mg/dL — ABNORMAL HIGH (ref 65–99)

## 2017-12-18 MED ORDER — INSULIN GLARGINE 100 UNIT/ML ~~LOC~~ SOLN
10.0000 [IU] | Freq: Every day | SUBCUTANEOUS | Status: DC
  Filled 2017-12-18: qty 0.1

## 2017-12-18 MED ORDER — DIPHENHYDRAMINE HCL 25 MG PO CAPS: 25.0000 mg | ORAL_CAPSULE | Freq: Three times a day (TID) | ORAL | 0 refills | Status: DC | PRN

## 2017-12-18 NOTE — Progress Notes (Signed)
Pt left facility - escorted downstairs to her ride via wheelchair with NT. IV d/c'd and discharge instructions given by previous RN.

## 2017-12-18 NOTE — Progress Notes (Signed)
Post HD Tx  

## 2017-12-18 NOTE — Progress Notes (Signed)
HD TX ended  

## 2017-12-18 NOTE — Progress Notes (Signed)
Post HD Assessment 

## 2017-12-18 NOTE — Progress Notes (Signed)
HD Tx started  

## 2017-12-18 NOTE — Discharge Summary (Signed)
[facility] NAME: [name]    MR#:  [id number]  DATE OF BIRTH:  10-Mar-1966  DATE OF ADMISSION:  12/14/2017 ADMITTING PHYSICIAN: Max Sane, MD  DATE OF DISCHARGE: 12/18/2017  PRIMARY CARE PHYSICIAN: Mikey College, NP    ADMISSION DIAGNOSIS:  Hyperkalemia [E87.5] Right sided abdominal pain [R10.9] ESRD on hemodialysis ([place]) [N18.6, Z99.2] Edema, unspecified type [R60.9]  DISCHARGE DIAGNOSIS:  Active Problems:   Acute respiratory failure ([place])   SECONDARY DIAGNOSIS:   Past Medical History:  Diagnosis Date  . Allergy   . Anemia   . Anxiety    worse when not at home - bowel incontinence  . Arthritis   . Ataxia   . CHF (congestive heart failure) (HCC)    history of, has been cleared.  . Chronic kidney disease   . COPD (chronic obstructive pulmonary disease) ([place])   . Depression   . Diabetes mellitus without complication ([place])   . Emphysema of lung ([place])   . End stage renal disease ([place])    MWF dialysis  . Fistula    left upper arm  . Headache    migraines  . Hepatitis 2003   Hep C  . Hiatal hernia   . Hypercholesterolemia    pt denies  . Hypertension   . Kidney disease   . Neuropathy, diabetic (HCC)    lower legs  . Peripheral vascular disease ([place])   . Pneumonia 2015  . Psoriasis   . Tobacco dependence   . Wears dentures    full upper, partial lower    HOSPITAL COURSE:   51 year old female with a known history of ESRD being admitted for acute hyperkalemia  *Acute hyperkalemia with EKG changes -Potassium was 7.1 -Patient was given calcium chloride with insulin and dextrose - Kayexalate - repeat potassium is down, nephro to help with HD.  *Abdominal pain and leg heaviness -likely due to fluid overload From lack of dialysis -CT of the abdomen was negative for any acute pathology - PT eval. Suggest HHA PT  * HD access malfunction   S/p fistulogram and ballooning.  *  hypotension during HD 12/17/17   Feeling very fatigued and have aches and pains all over.   Stopped HD and fluids given.   Monitor today and follow labs, canceled discharged ( as it was planned earlier)   BP stable and felt better next day, walked with help of nurse, ready for d/c.  *Hypertension -Continue Cardizem  *Neuropathy -Continue gabapentin  * Chronic back pain   Cont oxycodone.   DISCHARGE CONDITIONS:   Stable.  CONSULTS OBTAINED:  Treatment Team:  Murlean Iba, MD Schnier, Dolores Lory, MD  DRUG ALLERGIES:   Allergies  Allergen Reactions  . Cinnamon Anaphylaxis  . Garlic Anaphylaxis and Hives  . Onion Hives and Swelling  . Tylenol [Acetaminophen] Anaphylaxis  . Ciprofloxacin Diarrhea  . Prednisone Other (See Comments)    Vaginal blisters & oral blisters    DISCHARGE MEDICATIONS:   Allergies as of 12/18/2017      Reactions   Cinnamon Anaphylaxis   Garlic Anaphylaxis, Hives   Onion Hives, Swelling   Tylenol [acetaminophen] Anaphylaxis   Ciprofloxacin Diarrhea   Prednisone Other (See Comments)   Vaginal blisters & oral blisters      Medication List    TAKE these medications   b complex-vitamin c-folic acid 0.8 MG Tabs tablet Take 1 tablet by mouth daily.   calcium acetate 667 MG  capsule Commonly known as:  PHOSLO Take 1,334 mg by mouth 3 (three) times daily with meals.   clobetasol ointment 0.05 % Commonly known as:  TEMOVATE Apply 1 application topically daily as needed (for psorasis).   diltiazem 120 MG 24 hr capsule Commonly known as:  TIAZAC Take 240 mg by mouth 2 (two) times daily.   diphenhydrAMINE 25 mg capsule Commonly known as:  BENADRYL Take 1 capsule (25 mg total) by mouth every 8 (eight) hours as needed for itching.   gabapentin 100 MG capsule Commonly known as:  NEURONTIN Take 1 capsule (100 mg total) by mouth 3 (three) times daily. OR 3 capsules at bedtime.   Glecaprevir-Pibrentasvir 100-40 MG Tabs Commonly known as:   MAVYRET Take 3 tablets by mouth daily.   glucose 4 GM chewable tablet Chew 1 tablet (4 g total) by mouth as needed for low blood sugar.   IMODIUM A-D 2 MG tablet Generic drug:  loperamide Take 2 mg by mouth 3 (three) times daily as needed.   insulin glargine 100 UNIT/ML injection Commonly known as:  LANTUS Inject 15 Units into the skin daily.   insulin regular 100 units/mL injection Commonly known as:  NOVOLIN R,HUMULIN R Inject 0.02-0.04 mLs (2-4 Units total) into the skin 3 (three) times daily with meals. What changed:  how much to take   midodrine 5 MG tablet Commonly known as:  PROAMATINE Take 5 mg by mouth as needed (when blood pressure is low).   oxyCODONE 5 MG immediate release tablet Commonly known as:  Oxy IR/ROXICODONE Take 1 tablet (5 mg total) by mouth 3 (three) times daily as needed for severe pain. For chronic pain To fill on or after: 11/17/17, 12/16/17 To last 30 days from fill date   promethazine 25 MG tablet Commonly known as:  PHENERGAN Take 1 tablet (25 mg total) by mouth every 4 (four) hours as needed for nausea or vomiting.   sevelamer carbonate 800 MG tablet Commonly known as:  RENVELA Take 1,600 mg by mouth 3 (three) times daily with meals.        DISCHARGE INSTRUCTIONS:    Follow with PMD in 1-2 weeks.  If you experience worsening of your admission symptoms, develop shortness of breath, life threatening emergency, suicidal or homicidal thoughts you must seek medical attention immediately by calling 911 or calling your MD immediately  if symptoms less severe.  You Must read complete instructions/literature along with all the possible adverse reactions/side effects for all the Medicines you take and that have been prescribed to you. Take any new Medicines after you have completely understood and accept all the possible adverse reactions/side effects.   Please note  You were cared for by a hospitalist during your hospital stay. If you have  any questions about your discharge medications or the care you received while you were in the hospital after you are discharged, you can call the unit and asked to speak with the hospitalist on call if the hospitalist that took care of you is not available. Once you are discharged, your primary care physician will handle any further medical issues. Please note that NO REFILLS for any discharge medications will be authorized once you are discharged, as it is imperative that you return to your primary care physician (or establish a relationship with a primary care physician if you do not have one) for your aftercare needs so that they can reassess your need for medications and monitor your lab values.    Today  CHIEF COMPLAINT:   Chief Complaint  Patient presents with  . Weakness    HISTORY OF PRESENT ILLNESS:  [name]  is a 51 y.o. female with a known history of end-stage renal disease on hemodialysis Mondays, Wednesdays, and Fridays, who presents for evaluation of a variety of complaints. Specifically she states that for several days she has had intermittent sharp and aching abdominal pain in the right side of her abdomen which nothing in particular makes it better nor worse. She has had nausea but no vomiting. She is also feeling palpitations and leg heaviness.   VITAL SIGNS:  Blood pressure (!) 143/57, pulse 66, temperature 98.4 F (36.9 C), temperature source Oral, resp. rate 16, height 5\' 7"  (1.702 m), weight 222 lb 14.2 oz (101.1 kg), SpO2 98 %.  I/O:    Intake/Output Summary (Last 24 hours) at 12/18/2017 1100 Last data filed at 12/18/2017 1036 Gross per 24 hour  Intake 120 ml  Output 159 ml  Net -39 ml    PHYSICAL EXAMINATION:   GENERAL:  51 y.o.-year-old patient lying in the bed with no acute distress.  EYES: Pupils equal, round, reactive to light and accommodation. No scleral icterus. Extraocular muscles intact.  HEENT: Head atraumatic, normocephalic. Oropharynx and  nasopharynx clear.  NECK:  Supple, no jugular venous distention. No thyroid enlargement, no tenderness.  LUNGS: Normal breath sounds bilaterally, no wheezing, rales,rhonchi or crepitation. No use of accessory muscles of respiration.  CARDIOVASCULAR: S1, S2 normal. No murmurs, rubs, or gallops.  ABDOMEN: Soft, nontender, nondistended. Bowel sounds present. No organomegaly or mass.  EXTREMITIES: No pedal edema, cyanosis, or clubbing.  NEUROLOGIC: Cranial nerves II through XII are intact. Muscle strength 4- 5/5 in all extremities. Sensation intact. Gait not checked.  PSYCHIATRIC: The patient is alert and oriented x 3.  SKIN: No obvious rash, lesion, or ulcer.     DATA REVIEW:   CBC Recent Labs  Lab 12/15/17 0353  WBC 6.5  HGB 12.3  HCT 36.0  PLT 162    Chemistries  Recent Labs  Lab 12/14/17 2323  12/18/17 0540  NA 131*   < > 134*  K 7.1*   < > 5.4*  CL 98*   < > 97*  CO2 20*   < > 29  GLUCOSE 183*   < > 60*  BUN 76*   < > 43*  CREATININE 6.63*   < > 4.49*  CALCIUM 8.3*   < > 7.9*  MG 2.4  --   --   AST 23  --   --   ALT 16  --   --   ALKPHOS 60  --   --   BILITOT 0.9  --   --    < > = values in this interval not displayed.    Cardiac Enzymes Recent Labs  Lab 12/14/17 2323  TROPONINI <0.03    Microbiology Results  Results for orders placed or performed during the hospital encounter of 12/14/17  MRSA PCR Screening     Status: None   Collection Time: 12/15/17  6:36 AM  Result Value Ref Range Status   MRSA by PCR NEGATIVE NEGATIVE Final    Comment:        The GeneXpert MRSA Assay (FDA approved for NASAL specimens only), is one component of a comprehensive MRSA colonization surveillance program. It is not intended to diagnose MRSA infection nor to guide or monitor treatment for MRSA infections. Performed at [facility], [address] [place],  [place] [address]     RADIOLOGY:  No results found.  EKG:   Orders placed or performed during  the hospital encounter of 12/14/17  . ED EKG  . ED EKG  . EKG 12-Lead  . EKG 12-Lead      Management plans discussed with the patient, family and they are in agreement.  CODE STATUS:     Code Status Orders  (From admission, onward)        Start     Ordered   12/15/17 0300  Full code  Continuous     12/15/17 0259    Code Status History    Date Active Date Inactive Code Status Order ID Comments User Context   05/19/2017 16:57 05/21/2017 21:20 Full Code [id number]  Hillary Bow, MD ED   01/08/2017 18:18 01/12/2017 13:32 Full Code [id number]  Dustin Flock, MD Inpatient   07/13/2016 03:08 07/14/2016 09:10 Full Code [id number]  Saundra Shelling, MD Inpatient   06/06/2016 22:13 06/11/2016 00:51 Full Code [id number]  Henreitta Leber, MD Inpatient      TOTAL TIME TAKING CARE OF THIS PATIENT: 35 minutes.    Vaughan Basta M.D on 12/18/2017 at 11:00 AM  Between 7am to 6pm - Pager - [phone]  After 6pm go to [email or web address] - password [email or web address] [facility]  Office  [phone]  CC: Primary care physician; Mikey College, NP   Note: This dictation was prepared with Dragon dictation along with smaller phrase technology. Any transcriptional errors that result from this process are unintentional.

## 2017-12-18 NOTE — Progress Notes (Signed)
Pre HD Tx  

## 2017-12-18 NOTE — Progress Notes (Signed)
[facility], [place] 12/18/17  Subjective:   Patient is doing well today No acute shortness of breath She is 3 kg over her dry weight  Objective:  Vital signs in last 24 hours:  Temp:  [98.4 F (36.9 C)-99.5 F (37.5 C)] 98.6 F (37 C) (12/29 1151) Pulse Rate:  [66-83] 74 (12/29 1151) Resp:  [11-18] 16 (12/29 0434) BP: (143-180)/(55-71) 180/71 (12/29 1151) SpO2:  [95 %-100 %] 100 % (12/29 1151)  Weight change: 4.484 kg (9 lb 14.2 oz) Filed Weights   12/15/17 1511 12/16/17 1536 12/17/17 0940  Weight: 96.8 kg (213 lb 6.5 oz) 96.6 kg (213 lb) 101.1 kg (222 lb 14.2 oz)    Intake/Output:    Intake/Output Summary (Last 24 hours) at 12/18/2017 1222 Last data filed at 12/18/2017 1036 Gross per 24 hour  Intake 120 ml  Output 159 ml  Net -39 ml     Physical Exam: General:  No acute distress, sitting up in bed  HEENT  anicteric, moist oral mucous membranes  Neck  supple  Pulm/lungs  lungs are clear to auscultation  CVS/Heart  no rub  Abdomen:   Soft, nontender  Extremities:  Trace edema  Neurologic:  Alert, oriented  Skin:  No acute rashes  Access:  Left upper arm AV fistula       Basic Metabolic Panel:  Recent Labs  Lab 12/14/17 2323 12/15/17 0353 12/15/17 0614 12/15/17 1357 12/15/17 1450 12/16/17 0352 12/18/17 0540  NA 131* 134*  --   --   --  135 134*  K 7.1* 5.6* 5.7* 3.7 3.5 5.0 5.4*  CL 98* 98*  --   --   --  96* 97*  CO2 20* 21*  --   --   --  29 29  GLUCOSE 183* 213*  --   --   --  60* 60*  BUN 76* 75*  --   --   --  44* 43*  CREATININE 6.63* 7.05*  --   --   --  4.83* 4.49*  CALCIUM 8.3* 8.8*  --   --   --  8.3* 7.9*  MG 2.4  --   --   --   --   --   --      CBC: Recent Labs  Lab 12/14/17 2323 12/15/17 0353  WBC 8.5 6.5  HGB 12.9 12.3  HCT 38.5 36.0  MCV 98.9 98.3  PLT 191 162      Lab Results  Component Value Date   HEPBSAG Negative 01/08/2017   HEPBSAB Non Reactive 01/08/2017       Microbiology:  Recent Results (from the past 240 hour(s))  MRSA PCR Screening     Status: None   Collection Time: 12/15/17  6:36 AM  Result Value Ref Range Status   MRSA by PCR NEGATIVE NEGATIVE Final    Comment:        The GeneXpert MRSA Assay (FDA approved for NASAL specimens only), is one component of a comprehensive MRSA colonization surveillance program. It is not intended to diagnose MRSA infection nor to guide or monitor treatment for MRSA infections. Performed at [facility], [address] [place], [place] [address]     Coagulation Studies: No results for input(s): LABPROT, INR in the last 72 hours.  Urinalysis: No results for input(s): COLORURINE, LABSPEC, PHURINE, GLUCOSEU, HGBUR, BILIRUBINUR, KETONESUR, PROTEINUR, UROBILINOGEN, NITRITE, LEUKOCYTESUR in the last 72 hours.  Invalid input(s): APPERANCEUR    Imaging: No results found.  Medications:   . sodium chloride     . calcium acetate  1,334 mg Oral TID WC  . diltiazem  240 mg Oral BID  . docusate sodium  100 mg Oral BID  . Glecaprevir-Pibrentasvir  3 tablet Oral Daily  . heparin  5,000 Units Subcutaneous Q8H  . insulin aspart  0-9 Units Subcutaneous TID WC  . [START ON 12/19/2017] insulin glargine  10 Units Subcutaneous Daily  . multivitamin  1 tablet Oral QHS  . sevelamer carbonate  1,600 mg Oral TID WC  . sodium chloride flush  3 mL Intravenous Q12H   sodium chloride, bisacodyl, diphenhydrAMINE, glucose, midodrine, ondansetron **OR** ondansetron (ZOFRAN) IV, oxyCODONE, promethazine, sodium chloride flush, traZODone  Assessment/ Plan:  51 y.o. female with medical problems of hypertension, diastolic heart failure, diabetes mellitus type 2, and anemia of chronic kidney disease, hepatitis C, depression, psoriasis, peripheral neuropathy, ESRD, history of left foot blister/LLE cellulitis, angioplasty of left arm fistula December 27,2018  [facility],MWF.  EDW 94  kg  1.  End-stage renal disease 2.  Severe hyperkalemia 3.  Anemia of chronic kidney disease, hemoglobin 12.3 4.  Secondary hyperparathyroidism  Continue HD  Patient is 2.8 kg over her dry weight.  We will do an extra dialysis treatment for fluid removal prior to discharge today. Hgb acceptable Continue home dose of binders      LOS: 3 Lliam Hoh 12/29/201812:22 PM  [place], [place]

## 2017-12-18 NOTE — Progress Notes (Signed)
Pre HD Assessment  

## 2017-12-18 NOTE — Care Management Note (Signed)
Case Management Note  Patient Details  Name: AKUA BLETHEN MRN: [id number] Date of Birth: [birth year]  Subjective/Objective:     A referral for HH=PT was called to Melene Muller at [facility] per patient choice. Mrs Sokoloski already has a RW at home.                Action/Plan:   Expected Discharge Date:  12/18/17               Expected Discharge Plan:  [place]  In-House Referral:     Discharge planning Services  CM Consult  Post Acute Care Choice:  Home Health Choice offered to:  Patient  DME Arranged:    DME Agency:     HH Arranged:  PT [place]:  [place]  Status of Service:  Completed, signed off  If discussed at [place] of Stay Meetings, dates discussed:    Additional Comments:  Lular Letson A, RN 12/18/2017, 12:46 PM

## 2017-12-20 ENCOUNTER — Telehealth: Payer: Self-pay

## 2017-12-20 ENCOUNTER — Other Ambulatory Visit: Payer: Self-pay

## 2017-12-20 DIAGNOSIS — Z992 Dependence on renal dialysis: Secondary | ICD-10-CM | POA: Diagnosis not present

## 2017-12-20 DIAGNOSIS — N2581 Secondary hyperparathyroidism of renal origin: Secondary | ICD-10-CM | POA: Diagnosis not present

## 2017-12-20 DIAGNOSIS — D509 Iron deficiency anemia, unspecified: Secondary | ICD-10-CM | POA: Diagnosis not present

## 2017-12-20 DIAGNOSIS — N186 End stage renal disease: Secondary | ICD-10-CM | POA: Diagnosis not present

## 2017-12-20 DIAGNOSIS — Z748 Other problems related to care provider dependency: Secondary | ICD-10-CM

## 2017-12-20 DIAGNOSIS — R110 Nausea: Secondary | ICD-10-CM | POA: Diagnosis not present

## 2017-12-20 DIAGNOSIS — D631 Anemia in chronic kidney disease: Secondary | ICD-10-CM | POA: Diagnosis not present

## 2017-12-20 NOTE — Telephone Encounter (Signed)
Transition Care Management Follow-up Telephone Call   Date discharged? 12/18/2017   How have you been since you were released from the hospital? "Still pretty weak"    Do you understand why you were in the hospital? Yes   Do you understand the discharge instructions? yes   Where were you discharged to? Home   Items Reviewed:  Medications reviewed: yes  Allergies reviewed: yes  Dietary changes reviewed: yes  Referrals reviewed: yes   Functional Questionnaire:   Activities of Daily Living (ADLs):   She states they are independent in the following: ambulation, feeding, continence, grooming, toileting and dressing States they require assistance with the following: bathing and hygiene   Any transportation issues/concerns?: Uses [employer] for transportation- possible need for alternative transportation. Will place referral to connected care   Any patient concerns? no   Confirmed importance and date/time of follow-up visits scheduled yes  Provider Appointment booked with Lissa Merlin for 12/27/2017 at 10:20am.   Confirmed with patient if condition begins to worsen call PCP or go to the ER.  Patient was given the office number and encouraged to call back with question or concerns.  : yes

## 2017-12-22 DIAGNOSIS — D631 Anemia in chronic kidney disease: Secondary | ICD-10-CM | POA: Diagnosis not present

## 2017-12-22 DIAGNOSIS — Z992 Dependence on renal dialysis: Secondary | ICD-10-CM | POA: Diagnosis not present

## 2017-12-22 DIAGNOSIS — N2581 Secondary hyperparathyroidism of renal origin: Secondary | ICD-10-CM | POA: Diagnosis not present

## 2017-12-22 DIAGNOSIS — D509 Iron deficiency anemia, unspecified: Secondary | ICD-10-CM | POA: Diagnosis not present

## 2017-12-22 DIAGNOSIS — N186 End stage renal disease: Secondary | ICD-10-CM | POA: Diagnosis not present

## 2017-12-23 DIAGNOSIS — E1151 Type 2 diabetes mellitus with diabetic peripheral angiopathy without gangrene: Secondary | ICD-10-CM | POA: Diagnosis not present

## 2017-12-23 DIAGNOSIS — Z794 Long term (current) use of insulin: Secondary | ICD-10-CM | POA: Diagnosis not present

## 2017-12-23 DIAGNOSIS — I1310 Hypertensive heart and chronic kidney disease without heart failure, with stage 1 through stage 4 chronic kidney disease, or unspecified chronic kidney disease: Secondary | ICD-10-CM | POA: Diagnosis not present

## 2017-12-23 DIAGNOSIS — I509 Heart failure, unspecified: Secondary | ICD-10-CM | POA: Diagnosis not present

## 2017-12-23 DIAGNOSIS — F17210 Nicotine dependence, cigarettes, uncomplicated: Secondary | ICD-10-CM | POA: Diagnosis not present

## 2017-12-23 DIAGNOSIS — B1920 Unspecified viral hepatitis C without hepatic coma: Secondary | ICD-10-CM | POA: Diagnosis not present

## 2017-12-23 DIAGNOSIS — E1122 Type 2 diabetes mellitus with diabetic chronic kidney disease: Secondary | ICD-10-CM | POA: Diagnosis not present

## 2017-12-23 DIAGNOSIS — N186 End stage renal disease: Secondary | ICD-10-CM | POA: Diagnosis not present

## 2017-12-23 DIAGNOSIS — J439 Emphysema, unspecified: Secondary | ICD-10-CM | POA: Diagnosis not present

## 2017-12-23 DIAGNOSIS — Z9181 History of falling: Secondary | ICD-10-CM | POA: Diagnosis not present

## 2017-12-23 DIAGNOSIS — F329 Major depressive disorder, single episode, unspecified: Secondary | ICD-10-CM | POA: Diagnosis not present

## 2017-12-23 DIAGNOSIS — D631 Anemia in chronic kidney disease: Secondary | ICD-10-CM | POA: Diagnosis not present

## 2017-12-23 DIAGNOSIS — E1140 Type 2 diabetes mellitus with diabetic neuropathy, unspecified: Secondary | ICD-10-CM | POA: Diagnosis not present

## 2017-12-23 DIAGNOSIS — F419 Anxiety disorder, unspecified: Secondary | ICD-10-CM | POA: Diagnosis not present

## 2017-12-23 DIAGNOSIS — Z992 Dependence on renal dialysis: Secondary | ICD-10-CM | POA: Diagnosis not present

## 2017-12-23 DIAGNOSIS — L409 Psoriasis, unspecified: Secondary | ICD-10-CM | POA: Diagnosis not present

## 2017-12-24 DIAGNOSIS — N186 End stage renal disease: Secondary | ICD-10-CM | POA: Diagnosis not present

## 2017-12-24 DIAGNOSIS — D631 Anemia in chronic kidney disease: Secondary | ICD-10-CM | POA: Diagnosis not present

## 2017-12-24 DIAGNOSIS — Z992 Dependence on renal dialysis: Secondary | ICD-10-CM | POA: Diagnosis not present

## 2017-12-24 DIAGNOSIS — N2581 Secondary hyperparathyroidism of renal origin: Secondary | ICD-10-CM | POA: Diagnosis not present

## 2017-12-24 DIAGNOSIS — D509 Iron deficiency anemia, unspecified: Secondary | ICD-10-CM | POA: Diagnosis not present

## 2017-12-27 ENCOUNTER — Telehealth: Payer: Self-pay

## 2017-12-27 ENCOUNTER — Ambulatory Visit (INDEPENDENT_AMBULATORY_CARE_PROVIDER_SITE_OTHER): Payer: Medicare Other | Admitting: Nurse Practitioner

## 2017-12-27 ENCOUNTER — Encounter: Payer: Self-pay | Admitting: Nurse Practitioner

## 2017-12-27 VITALS — BP 153/58 | HR 78 | Temp 98.1°F | Ht 67.0 in | Wt 218.0 lb

## 2017-12-27 DIAGNOSIS — E1140 Type 2 diabetes mellitus with diabetic neuropathy, unspecified: Secondary | ICD-10-CM | POA: Diagnosis not present

## 2017-12-27 DIAGNOSIS — N2581 Secondary hyperparathyroidism of renal origin: Secondary | ICD-10-CM | POA: Diagnosis not present

## 2017-12-27 DIAGNOSIS — Z1231 Encounter for screening mammogram for malignant neoplasm of breast: Secondary | ICD-10-CM | POA: Diagnosis not present

## 2017-12-27 DIAGNOSIS — D631 Anemia in chronic kidney disease: Secondary | ICD-10-CM | POA: Diagnosis not present

## 2017-12-27 DIAGNOSIS — L409 Psoriasis, unspecified: Secondary | ICD-10-CM

## 2017-12-27 DIAGNOSIS — E1165 Type 2 diabetes mellitus with hyperglycemia: Secondary | ICD-10-CM | POA: Diagnosis not present

## 2017-12-27 DIAGNOSIS — E118 Type 2 diabetes mellitus with unspecified complications: Secondary | ICD-10-CM

## 2017-12-27 DIAGNOSIS — E875 Hyperkalemia: Secondary | ICD-10-CM | POA: Diagnosis not present

## 2017-12-27 DIAGNOSIS — IMO0002 Reserved for concepts with insufficient information to code with codable children: Secondary | ICD-10-CM

## 2017-12-27 DIAGNOSIS — N186 End stage renal disease: Secondary | ICD-10-CM | POA: Diagnosis not present

## 2017-12-27 DIAGNOSIS — D509 Iron deficiency anemia, unspecified: Secondary | ICD-10-CM | POA: Diagnosis not present

## 2017-12-27 DIAGNOSIS — Z1239 Encounter for other screening for malignant neoplasm of breast: Secondary | ICD-10-CM

## 2017-12-27 DIAGNOSIS — Z992 Dependence on renal dialysis: Secondary | ICD-10-CM | POA: Diagnosis not present

## 2017-12-27 MED ORDER — INSULIN ASPART 100 UNIT/ML ~~LOC~~ SOLN: 1.0000 [IU] | Freq: Three times a day (TID) | SUBCUTANEOUS | 11 refills | Status: DC

## 2017-12-27 MED ORDER — GLUCOSE BLOOD VI STRP: ORAL_STRIP | 12 refills | Status: DC

## 2017-12-27 NOTE — Progress Notes (Addendum)
Subjective:    Patient ID: [name], female    DOB: [birth year], 52 y.o.   MRN: [id number]  SILVANA HOLECEK is a 52 y.o. female presenting on 12/27/2017 for Hospitalization Follow-up (hyperkalemia)  HPI  HOSPITAL FOLLOW-UP VISIT - Hospital/Location: [facility] - Date of Admission: 12-14-2017 - Date of Discharge: 12-18-2017 - Transitions of care telephone call: 12/20/2017  Reason for Admission: Hyperkalemia with EKG changes Primary (+Secondary) Diagnosis: Right sided abdominal pain, ESRD on HD, HD access malfunction, Edema, acute respiratory failure  - Hospital H&P and Discharge Summary have been reviewed - Patient presents today 9 days after recent hospitalization. Pt presented with sharp/aching abdominal pain in R abdomen with nausea and no vomiting.  She was also having palpitations and leg heaviness. She was found to have hyperkalemia, fluid overload, hypertension, and neuropathy.  She was treated with urgent dialysis.  She had difficulty with her HD access, so vascular was consulted and Dr. Lucky Cowboy performed procedure to correct her fistula. - Pt reports feeling generally well since hospitalization.  She has had no trouble with HD fistula access initially after hospitalization and revision of HD access, but did have clot at last treatment with shortening to 2 hours total treatment time.   - [facility]F [facility].  - Change to medications on discharge: Novolog instead of regular insulin and has had single hypoglycemic event.  Overall feels well with this change and hypoglycemic event was corrected quickly.  Diabetes She is checking fasting am CBG at home, but does not have her readings or meter with her today. - Current diabetic medications include: lantus 15 units once daily and novolog 2-4 units tid with meals based on size of meal and carb servings. - She is not currently symptomatic except for paresthesias and neuropathy of feet. - She denies polydipsia,  polyphagia, polyuria, headaches, diaphoresis, shakiness, chills and changes in vision.   - Clinical course has been improving. - She  reports an exercise routine that includes physical therapy and walking, 3-4 days per week. - Her diet is low in salt, low in fat, and low in carbohydrates.  She has made significant changes in her diet since last visit and is improving carbohydrate choices.  With this change, she is noticing lower am CBGs and has had a single hypoglycemic event. - Weight trend: increased  PREVENTION: Eye exam current (within one year): yes Foot exam current (within one year): yes Lipid/ASCVD risk reduction - on statin: none Kidney protection - on ace or arb: none indicated (ESRD) Recent Labs    05/19/17 1554 07/12/17 12/27/17 1035  HGBA1C 8.0* 8.3 6.7*    Foot Pain  Patient has had PT since being home from hospital and notes she has had recommendation by PT to obtain diabetic shoes.  Patient states her gait is altered and she needs additional shoe support.  PT reccommends arch supports as is walking on inside edge of foot.   Pt reports she burned her foot 2 years ago and still having trouble with pain and mobility and ulcer.  Pt's next podiatry appointment is on 02/09/18 - Dr. Cleda Mccreedy. They can provide orthotics, but will not provide diabetic shoe fittings.   Social History   Tobacco Use  . Smoking status: Light Tobacco Smoker    Packs/day: 0.25    Years: 20.00    Pack years: 5.00    Types: Cigarettes    Last attempt to quit: 05/21/2017    Years since quitting: 0.6  .  Smokeless tobacco: Never Used  Substance Use Topics  . Alcohol use: No  . Drug use: No    Review of Systems Per HPI unless specifically indicated above  Outpatient Encounter Medications as of 12/27/2017  Medication Sig  . b complex-vitamin c-folic acid (NEPHRO-VITE) 0.8 MG TABS tablet Take 1 tablet by mouth daily.   . calcium acetate (PHOSLO) 667 MG capsule Take 1,334 mg by mouth 3 (three) times daily  with meals.   . clobetasol ointment (TEMOVATE) 3.15 % Apply 1 application topically daily as needed (for psorasis).   Marland Kitchen diltiazem (TIAZAC) 120 MG 24 hr capsule Take 240 mg by mouth 2 (two) times daily.   Marland Kitchen glucose 4 GM chewable tablet Chew 1 tablet (4 g total) by mouth as needed for low blood sugar.  Marland Kitchen glucose blood (TRUE METRIX BLOOD GLUCOSE TEST) test strip Use as instructed  . insulin aspart (NOVOLOG) 100 UNIT/ML injection Inject 1-2 Units into the skin 3 (three) times daily before meals.  . insulin glargine (LANTUS) 100 UNIT/ML injection Inject 15 Units into the skin daily.   Marland Kitchen loperamide (IMODIUM A-D) 2 MG tablet Take 2 mg by mouth 3 (three) times daily as needed.   . midodrine (PROAMATINE) 5 MG tablet Take 5 mg by mouth as needed (when blood pressure is low).  Marland Kitchen oxyCODONE (OXY IR/ROXICODONE) 5 MG immediate release tablet Take 1 tablet (5 mg total) by mouth 3 (three) times daily as needed for severe pain. For chronic pain To fill on or after: 11/17/17, 12/16/17 To last 30 days from fill date  . promethazine (PHENERGAN) 25 MG tablet Take 1 tablet (25 mg total) by mouth every 4 (four) hours as needed for nausea or vomiting.  . sevelamer carbonate (RENVELA) 800 MG tablet Take 1,600 mg by mouth 3 (three) times daily with meals.  . [DISCONTINUED] diphenhydrAMINE (BENADRYL) 25 mg capsule Take 1 capsule (25 mg total) by mouth every 8 (eight) hours as needed for itching. (Patient not taking: Reported on 12/20/2017)  . [DISCONTINUED] gabapentin (NEURONTIN) 100 MG capsule Take 1 capsule (100 mg total) by mouth 3 (three) times daily. OR 3 capsules at bedtime.  . [DISCONTINUED] Glecaprevir-Pibrentasvir (MAVYRET) 100-40 MG TABS Take 3 tablets by mouth daily.  . [DISCONTINUED] insulin regular (NOVOLIN R,HUMULIN R) 100 units/mL injection Inject 0.02-0.04 mLs (2-4 Units total) into the skin 3 (three) times daily with meals. (Patient taking differently: Inject 3-5 Units into the skin 3 (three) times daily with  meals. )   No facility-administered encounter medications on file as of 12/27/2017.       Objective:    BP (!) 153/58 (BP Location: Right Arm, Patient Position: Sitting, Cuff Size: Large)   Pulse 78   Temp 98.1 F (36.7 C) (Oral)   Ht 5\' 7"  (1.702 m)   Wt 218 lb (98.9 kg)   BMI 34.14 kg/m   Wt Readings from Last 3 Encounters:  12/27/17 218 lb (98.9 kg)  12/18/17 206 lb 12.7 oz (93.8 kg)  11/23/17 206 lb 12.8 oz (93.8 kg)    Physical Exam  General - overweight, well-appearing, NAD HEENT - Normocephalic, atraumatic Neck - supple, non-tender, no LAD, no thyromegaly, no carotid bruit Heart - RRR, no murmurs heard Lungs - Clear throughout all lobes, no wheezing, crackles, or rhonchi. Normal work of breathing. Extremeties - non-tender, no edema, cap refill < 2 seconds, peripheral pulses intact +2 bilaterally Skin - warm, dry, erythematous patches on scalp with silvery scales Neuro - awake, alert, oriented x3, antalgic  gait using single-prong cane Psych - Normal mood and affect, normal behavior   Diabetic Foot Form - Detailed   Diabetic Foot Exam - detailed Diabetic Foot exam was performed with the following findings:  Yes 12/27/2017 11:00 AM  Can the patient see the bottom of their feet?:  No Are the shoes appropriate in style and fit?:  No Is there swelling or and abnormal foot shape?:  No Is there a claw toe deformity?:  Yes (Comment: fallen foot arches) Is there elevated skin temparature?:  No Is there foot or ankle muscle weakness?:  Yes Normal Range of Motion:  Yes Pulse Foot Exam completed.:  Yes  Right posterior Tibialias:  Diminished Left posterior Tibialias:  Diminished  Right Dorsalis Pedis:  Diminished Left Dorsalis Pedis:  Diminished  Semmes-Weinstein Monofilament Test R Site 1-Great Toe:  Neg L Site 1-Great Toe:  Neg        Results for orders placed or performed in visit on 12/27/17  COMPLETE METABOLIC PANEL WITH GFR  Result Value Ref Range   Glucose, Bld 127  (H) 65 - 99 mg/dL   BUN 75 (H) 7 - 25 mg/dL   Creat 7.43 (H) 0.50 - 1.05 mg/dL   GFR, Est Non African American 6 (L) > OR = 60 mL/min/1.5m2   GFR, Est African American 7 (L) > OR = 60 mL/min/1.44m2   BUN/Creatinine Ratio 10 6 - 22 (calc)   Sodium 133 (L) 135 - 146 mmol/L   Potassium 5.2 3.5 - 5.3 mmol/L   Chloride 96 (L) 98 - 110 mmol/L   CO2 22 20 - 32 mmol/L   Calcium 8.7 8.6 - 10.4 mg/dL   Total Protein 8.3 (H) 6.1 - 8.1 g/dL   Albumin 4.0 3.6 - 5.1 g/dL   Globulin 4.3 (H) 1.9 - 3.7 g/dL (calc)   AG Ratio 0.9 (L) 1.0 - 2.5 (calc)   Total Bilirubin 0.5 0.2 - 1.2 mg/dL   Alkaline phosphatase (APISO) 64 33 - 130 U/L   AST 16 10 - 35 U/L   ALT 16 6 - 29 U/L  Hemoglobin A1c  Result Value Ref Range   Hgb A1c MFr Bld 6.7 (H) <5.7 % of total Hgb   Mean Plasma Glucose 146 (calc)   eAG (mmol/L) 8.1 (calc)      Assessment & Plan:   Problem List Items Addressed This Visit      Endocrine   Diabetes mellitus type 2, uncontrolled, with complications ([place]) - Primary    Improving control with A1c at goal after result today = 6.7  - Pt has only had 1 severe hypoglycemia episode, but notes hypoglycemia is occurring more frequently with changes in her diet.    Plan: 1. DECREASE mealtime insulin dose to Novolog 1-2 units each meal depending on size of meal and carb servings.  1 units small meal/1-2 carb servings and 2 units large meal/3 or more  carb servings. - Hold Novolog if preprandial glucose is less than 120. 2. Continue lantus 15 units once daily. 3. Reviewed need to check CBG prior to meals.  Keep log and bring to clinic. 4. Reviewed hypoglycemia, need to dose only insulin that is prescribed.  May need endocrinology appointment in future if no improvement w/ close follow up here. 5. 3 months and recheck A1c.      Relevant Medications   glucose blood (TRUE METRIX BLOOD GLUCOSE TEST) test strip   insulin aspart (NOVOLOG) 100 UNIT/ML injection   Other Relevant Orders  Hemoglobin  A1c (Completed)   Neuropathy due to type 2 diabetes mellitus (HCC)    Uncontrolled. Impacting pt ability to sleep and walk without pain.  Plan: 1. Certification of recommendation of diabetic shoes sent to [facility] medical supply. 2. Continue regular followup with podiatry. 3. Follow up as needed and in 3 months.      Relevant Medications   insulin aspart (NOVOLOG) 100 UNIT/ML injection     Other   Hyperkalemia   Relevant Orders   COMPLETE METABOLIC PANEL WITH GFR (Completed)    Other Visit Diagnoses    Breast cancer screening       Pt without mammogram in last 2 years.  Pt desires screening.  Mammogram ordered.  Instructions provided to pt for scheduling.   Relevant Orders   MM DIGITAL SCREENING BILATERAL   Psoriasis       Pt reports no improvement of her rash.  She requests repeat referral to dermatology as it was not completed 2 months ago when she was last seen. Referral placed   Relevant Orders   Ambulatory referral to Dermatology      Meds ordered this encounter  Medications  . glucose blood (TRUE METRIX BLOOD GLUCOSE TEST) test strip    Sig: Use as instructed    Dispense:  100 each    Refill:  12    Order Specific Question:   Supervising Provider    Answer:   Olin Hauser [[id number]]  . insulin aspart (NOVOLOG) 100 UNIT/ML injection    Sig: Inject 1-2 Units into the skin 3 (three) times daily before meals.    Dispense:  10 mL    Refill:  11    Order Specific Question:   Supervising Provider    Answer:   Olin Hauser [[id number]]    I have reviewed the discharge medication list, and have reconciled the current and discharge medications today.  Follow up plan: Return in about 3 months (around 03/27/2018) for diabetes.  Cassell Smiles, DNP, AGPCNP-BC Adult Gerontology Primary Care Nurse Practitioner [facility] 01/05/2018, 1:56 PM

## 2017-12-27 NOTE — Patient Instructions (Addendum)
[name], Thank you for coming in to clinic today.  1. Reduce evening novolog insulin to 1-2 units only if premeal blood sugar is > 150.  Then, dose based on carb servings.  1 unit for every 2 carb servings. If you are having 3 carb servings, use 2 units. - Continue Lantus 15 units once daily  2. Recheck labs today.  3. If you do not hear from [place] about your shoes by Wednesday, call clinic.   Please schedule a follow-up appointment with Cassell Smiles, AGNP. Return in about 3 months (around 03/27/2018) for diabetes.  If you have any other questions or concerns, please feel free to call the clinic or send a message through [place]. You may also schedule an earlier appointment if necessary.  You will receive a survey after today's visit either digitally by e-mail or paper by [employer]. Your experiences and feedback matter to [place].  Please respond so we know how we are doing as we provide care for you.   Cassell Smiles, DNP, AGNP-BC Adult Gerontology Nurse Practitioner [facility], [facility]   Carbohydrate Counting for Diabetes Mellitus, Adult Carbohydrate counting is a method for keeping track of how many carbohydrates you eat. Eating carbohydrates naturally increases the amount of sugar (glucose) in the blood. Counting how many carbohydrates you eat helps keep your blood glucose within normal limits, which helps you manage your diabetes (diabetes mellitus). It is important to know how many carbohydrates you can safely have in each meal. This is different for every person. A diet and nutrition specialist (registered dietitian) can help you make a meal plan and calculate how many carbohydrates you should have at each meal and snack. Carbohydrates are found in the following foods:  Grains, such as breads and cereals.  Dried beans and soy products.  Starchy vegetables, such as potatoes, peas, and corn.  Fruit and fruit juices.  Milk and yogurt.  Sweets and snack foods, such as  cake, cookies, candy, chips, and soft drinks.  How do I count carbohydrates? There are two ways to count carbohydrates in food. You can use either of the methods or a combination of both. Reading "Nutrition Facts" on packaged food The "Nutrition Facts" list is included on the labels of almost all packaged foods and beverages in the [place] It includes:  The serving size.  Information about nutrients in each serving, including the grams (g) of carbohydrate per serving.  To use the "Nutrition Facts":  Decide how many servings you will have.  Multiply the number of servings by the number of carbohydrates per serving.  The resulting number is the total amount of carbohydrates that you will be having.  Learning standard serving sizes of other foods When you eat foods containing carbohydrates that are not packaged or do not include "Nutrition Facts" on the label, you need to measure the servings in order to count the amount of carbohydrates:  Measure the foods that you will eat with a food scale or measuring cup, if needed.  Decide how many standard-size servings you will eat.  Multiply the number of servings by 15. Most carbohydrate-rich foods have about 15 g of carbohydrates per serving. ? For example, if you eat 8 oz (170 g) of strawberries, you will have eaten 2 servings and 30 g of carbohydrates (2 servings x 15 g = 30 g).  For foods that have more than one food mixed, such as soups and casseroles, you must count the carbohydrates in each food that is  included.  The following list contains standard serving sizes of common carbohydrate-rich foods. Each of these servings has about 15 g of carbohydrates:   hamburger bun or  English muffin.   oz (15 mL) syrup.   oz (14 g) jelly.  1 slice of bread.  1 six-inch tortilla.  3 oz (85 g) cooked rice or pasta.  4 oz (113 g) cooked dried beans.  4 oz (113 g) starchy vegetable, such as peas, corn, or potatoes.  4 oz (113 g) hot  cereal.  4 oz (113 g) mashed potatoes or  of a large baked potato.  4 oz (113 g) canned or frozen fruit.  4 oz (120 mL) fruit juice.  4-6 crackers.  6 chicken nuggets.  6 oz (170 g) unsweetened dry cereal.  6 oz (170 g) plain fat-free yogurt or yogurt sweetened with artificial sweeteners.  8 oz (240 mL) milk.  8 oz (170 g) fresh fruit or one small piece of fruit.  24 oz (680 g) popped popcorn.  Example of carbohydrate counting Sample meal  3 oz (85 g) chicken breast.  6 oz (170 g) brown rice.  4 oz (113 g) corn.  8 oz (240 mL) milk.  8 oz (170 g) strawberries with sugar-free whipped topping. Carbohydrate calculation 1. Identify the foods that contain carbohydrates: ? Rice. ? Corn. ? Milk. ? Strawberries. 2. Calculate how many servings you have of each food: ? 2 servings rice. ? 1 serving corn. ? 1 serving milk. ? 1 serving strawberries. 3. Multiply each number of servings by 15 g: ? 2 servings rice x 15 g = 30 g. ? 1 serving corn x 15 g = 15 g. ? 1 serving milk x 15 g = 15 g. ? 1 serving strawberries x 15 g = 15 g. 4. Add together all of the amounts to find the total grams of carbohydrates eaten: ? 30 g + 15 g + 15 g + 15 g = 75 g of carbohydrates total. This information is not intended to replace advice given to you by your health care provider. Make sure you discuss any questions you have with your health care provider. Document Released: 12/07/2005 Document Revised: 06/26/2016 Document Reviewed: 05/20/2016 Elsevier Interactive Patient Education  [employer].

## 2017-12-27 NOTE — Telephone Encounter (Signed)
I received a phone call from a Cherly Anderson , PT from [facility] requesting a verbal order to continue PT. I informed her that the pt current have an appt scheduled today for f/u hospital visit and after she is seen we can follow back up, because as of right now we are unsure she needs or desires to continue PT or if the order needs to be changed. She verbalize understanding. [phone]

## 2017-12-28 ENCOUNTER — Telehealth: Payer: Self-pay | Admitting: Nurse Practitioner

## 2017-12-28 LAB — COMPLETE METABOLIC PANEL WITH GFR
AG Ratio: 0.9 (calc) — ABNORMAL LOW (ref 1.0–2.5)
ALT: 16 U/L (ref 6–29)
AST: 16 U/L (ref 10–35)
Albumin: 4 g/dL (ref 3.6–5.1)
Alkaline phosphatase (APISO): 64 U/L (ref 33–130)
BUN/Creatinine Ratio: 10 (calc) (ref 6–22)
BUN: 75 mg/dL — ABNORMAL HIGH (ref 7–25)
CO2: 22 mmol/L (ref 20–32)
Calcium: 8.7 mg/dL (ref 8.6–10.4)
Chloride: 96 mmol/L — ABNORMAL LOW (ref 98–110)
Creat: 7.43 mg/dL — ABNORMAL HIGH (ref 0.50–1.05)
GFR, Est African American: 7 mL/min/{1.73_m2} — ABNORMAL LOW (ref >=60)
GFR, Est Non African American: 6 mL/min/{1.73_m2} — ABNORMAL LOW (ref >=60)
Globulin: 4.3 g/dL (calc) — ABNORMAL HIGH (ref 1.9–3.7)
Glucose, Bld: 127 mg/dL — ABNORMAL HIGH (ref 65–99)
Potassium: 5.2 mmol/L (ref 3.5–5.3)
Sodium: 133 mmol/L — ABNORMAL LOW (ref 135–146)
Total Bilirubin: 0.5 mg/dL (ref 0.2–1.2)
Total Protein: 8.3 g/dL — ABNORMAL HIGH (ref 6.1–8.1)

## 2017-12-28 LAB — HEMOGLOBIN A1C
Hgb A1c MFr Bld: 6.7 % of total Hgb — ABNORMAL HIGH (ref <5.7)
Mean Plasma Glucose: 146 (calc)
eAG (mmol/L): 8.1 (calc)

## 2017-12-28 NOTE — Telephone Encounter (Signed)
Rip Harbour with [facility] asked for a call back [phone]

## 2017-12-29 ENCOUNTER — Telehealth: Payer: Self-pay

## 2017-12-29 ENCOUNTER — Other Ambulatory Visit: Payer: Self-pay

## 2017-12-29 DIAGNOSIS — Z992 Dependence on renal dialysis: Secondary | ICD-10-CM | POA: Diagnosis not present

## 2017-12-29 DIAGNOSIS — E1140 Type 2 diabetes mellitus with diabetic neuropathy, unspecified: Secondary | ICD-10-CM | POA: Diagnosis not present

## 2017-12-29 DIAGNOSIS — D509 Iron deficiency anemia, unspecified: Secondary | ICD-10-CM | POA: Diagnosis not present

## 2017-12-29 DIAGNOSIS — N186 End stage renal disease: Secondary | ICD-10-CM | POA: Diagnosis not present

## 2017-12-29 DIAGNOSIS — N2581 Secondary hyperparathyroidism of renal origin: Secondary | ICD-10-CM | POA: Diagnosis not present

## 2017-12-29 DIAGNOSIS — L851 Acquired keratosis [keratoderma] palmaris et plantaris: Secondary | ICD-10-CM | POA: Diagnosis not present

## 2017-12-29 DIAGNOSIS — B351 Tinea unguium: Secondary | ICD-10-CM | POA: Diagnosis not present

## 2017-12-29 DIAGNOSIS — D631 Anemia in chronic kidney disease: Secondary | ICD-10-CM | POA: Diagnosis not present

## 2017-12-29 DIAGNOSIS — Z794 Long term (current) use of insulin: Secondary | ICD-10-CM | POA: Diagnosis not present

## 2017-12-29 MED ORDER — GLECAPREVIR-PIBRENTASVIR 100-40 MG PO TABS: 3.0000 | ORAL_TABLET | Freq: Every day | ORAL | 0 refills | Status: DC

## 2017-12-29 NOTE — Telephone Encounter (Signed)
Rx approved and faxed to [facility]//[email or web address]

## 2017-12-29 NOTE — Telephone Encounter (Signed)
Mavyret 100/40mg  was originally approved for 8 weeks of therapy.  [employer] is wanting to know if 12 weeks of therapy is needed.  Please advise.

## 2017-12-30 ENCOUNTER — Telehealth: Payer: Self-pay | Admitting: Nurse Practitioner

## 2017-12-30 DIAGNOSIS — J439 Emphysema, unspecified: Secondary | ICD-10-CM | POA: Diagnosis not present

## 2017-12-30 DIAGNOSIS — N186 End stage renal disease: Secondary | ICD-10-CM | POA: Diagnosis not present

## 2017-12-30 DIAGNOSIS — E1122 Type 2 diabetes mellitus with diabetic chronic kidney disease: Secondary | ICD-10-CM | POA: Diagnosis not present

## 2017-12-30 DIAGNOSIS — E1140 Type 2 diabetes mellitus with diabetic neuropathy, unspecified: Secondary | ICD-10-CM | POA: Diagnosis not present

## 2017-12-30 DIAGNOSIS — I1310 Hypertensive heart and chronic kidney disease without heart failure, with stage 1 through stage 4 chronic kidney disease, or unspecified chronic kidney disease: Secondary | ICD-10-CM | POA: Diagnosis not present

## 2017-12-30 DIAGNOSIS — I509 Heart failure, unspecified: Secondary | ICD-10-CM | POA: Diagnosis not present

## 2017-12-30 NOTE — Telephone Encounter (Signed)
Pt asked for lab results and diabetic shoes status [phone]

## 2017-12-30 NOTE — Telephone Encounter (Signed)
Atempted to contact the pt, no answer. LMOM to return my call.

## 2017-12-31 DIAGNOSIS — N2581 Secondary hyperparathyroidism of renal origin: Secondary | ICD-10-CM | POA: Diagnosis not present

## 2017-12-31 DIAGNOSIS — J439 Emphysema, unspecified: Secondary | ICD-10-CM | POA: Diagnosis not present

## 2017-12-31 DIAGNOSIS — Z992 Dependence on renal dialysis: Secondary | ICD-10-CM | POA: Diagnosis not present

## 2017-12-31 DIAGNOSIS — I1310 Hypertensive heart and chronic kidney disease without heart failure, with stage 1 through stage 4 chronic kidney disease, or unspecified chronic kidney disease: Secondary | ICD-10-CM | POA: Diagnosis not present

## 2017-12-31 DIAGNOSIS — D509 Iron deficiency anemia, unspecified: Secondary | ICD-10-CM | POA: Diagnosis not present

## 2017-12-31 DIAGNOSIS — E1140 Type 2 diabetes mellitus with diabetic neuropathy, unspecified: Secondary | ICD-10-CM | POA: Diagnosis not present

## 2017-12-31 DIAGNOSIS — I509 Heart failure, unspecified: Secondary | ICD-10-CM | POA: Diagnosis not present

## 2017-12-31 DIAGNOSIS — E1122 Type 2 diabetes mellitus with diabetic chronic kidney disease: Secondary | ICD-10-CM | POA: Diagnosis not present

## 2017-12-31 DIAGNOSIS — D631 Anemia in chronic kidney disease: Secondary | ICD-10-CM | POA: Diagnosis not present

## 2017-12-31 DIAGNOSIS — N186 End stage renal disease: Secondary | ICD-10-CM | POA: Diagnosis not present

## 2017-12-31 NOTE — Telephone Encounter (Signed)
Attempted to contact pt several times (3x), no answer. VM left to return my call.

## 2018-01-03 DIAGNOSIS — N186 End stage renal disease: Secondary | ICD-10-CM | POA: Diagnosis not present

## 2018-01-03 DIAGNOSIS — N2581 Secondary hyperparathyroidism of renal origin: Secondary | ICD-10-CM | POA: Diagnosis not present

## 2018-01-03 DIAGNOSIS — D631 Anemia in chronic kidney disease: Secondary | ICD-10-CM | POA: Diagnosis not present

## 2018-01-03 DIAGNOSIS — D509 Iron deficiency anemia, unspecified: Secondary | ICD-10-CM | POA: Diagnosis not present

## 2018-01-03 DIAGNOSIS — Z992 Dependence on renal dialysis: Secondary | ICD-10-CM | POA: Diagnosis not present

## 2018-01-04 ENCOUNTER — Other Ambulatory Visit (INDEPENDENT_AMBULATORY_CARE_PROVIDER_SITE_OTHER): Payer: Medicare Other

## 2018-01-04 ENCOUNTER — Ambulatory Visit (INDEPENDENT_AMBULATORY_CARE_PROVIDER_SITE_OTHER): Payer: Medicare Other | Admitting: Vascular Surgery

## 2018-01-04 DIAGNOSIS — E1122 Type 2 diabetes mellitus with diabetic chronic kidney disease: Secondary | ICD-10-CM | POA: Diagnosis not present

## 2018-01-04 DIAGNOSIS — I1310 Hypertensive heart and chronic kidney disease without heart failure, with stage 1 through stage 4 chronic kidney disease, or unspecified chronic kidney disease: Secondary | ICD-10-CM | POA: Diagnosis not present

## 2018-01-04 DIAGNOSIS — J439 Emphysema, unspecified: Secondary | ICD-10-CM | POA: Diagnosis not present

## 2018-01-04 DIAGNOSIS — E1140 Type 2 diabetes mellitus with diabetic neuropathy, unspecified: Secondary | ICD-10-CM | POA: Diagnosis not present

## 2018-01-04 DIAGNOSIS — N186 End stage renal disease: Secondary | ICD-10-CM | POA: Diagnosis not present

## 2018-01-04 DIAGNOSIS — I509 Heart failure, unspecified: Secondary | ICD-10-CM | POA: Diagnosis not present

## 2018-01-05 ENCOUNTER — Encounter: Payer: Self-pay | Admitting: Nurse Practitioner

## 2018-01-05 ENCOUNTER — Other Ambulatory Visit: Payer: Self-pay

## 2018-01-05 DIAGNOSIS — D631 Anemia in chronic kidney disease: Secondary | ICD-10-CM | POA: Diagnosis not present

## 2018-01-05 DIAGNOSIS — D509 Iron deficiency anemia, unspecified: Secondary | ICD-10-CM | POA: Diagnosis not present

## 2018-01-05 DIAGNOSIS — N2581 Secondary hyperparathyroidism of renal origin: Secondary | ICD-10-CM | POA: Diagnosis not present

## 2018-01-05 DIAGNOSIS — Z992 Dependence on renal dialysis: Secondary | ICD-10-CM | POA: Diagnosis not present

## 2018-01-05 DIAGNOSIS — N186 End stage renal disease: Secondary | ICD-10-CM | POA: Diagnosis not present

## 2018-01-05 NOTE — Assessment & Plan Note (Signed)
Improving control with A1c at goal after result today = 6.7  - Pt has only had 1 severe hypoglycemia episode, but notes hypoglycemia is occurring more frequently with changes in her diet.    Plan: 1. DECREASE mealtime insulin dose to Novolog 1-2 units each meal depending on size of meal and carb servings.  1 units small meal/1-2 carb servings and 2 units large meal/3 or more  carb servings. - Hold Novolog if preprandial glucose is less than 120. 2. Continue lantus 15 units once daily. 3. Reviewed need to check CBG prior to meals.  Keep log and bring to clinic. 4. Reviewed hypoglycemia, need to dose only insulin that is prescribed.  May need endocrinology appointment in future if no improvement w/ close follow up here. 5. 3 months and recheck A1c.

## 2018-01-05 NOTE — Assessment & Plan Note (Signed)
Uncontrolled. Impacting pt ability to sleep and walk without pain.  Plan: 1. Certification of recommendation of diabetic shoes sent to [facility] medical supply. 2. Continue regular followup with podiatry. 3. Follow up as needed and in 3 months.

## 2018-01-06 DIAGNOSIS — N186 End stage renal disease: Secondary | ICD-10-CM | POA: Diagnosis not present

## 2018-01-06 DIAGNOSIS — E1122 Type 2 diabetes mellitus with diabetic chronic kidney disease: Secondary | ICD-10-CM | POA: Diagnosis not present

## 2018-01-06 DIAGNOSIS — I509 Heart failure, unspecified: Secondary | ICD-10-CM | POA: Diagnosis not present

## 2018-01-06 DIAGNOSIS — I1310 Hypertensive heart and chronic kidney disease without heart failure, with stage 1 through stage 4 chronic kidney disease, or unspecified chronic kidney disease: Secondary | ICD-10-CM | POA: Diagnosis not present

## 2018-01-06 DIAGNOSIS — J439 Emphysema, unspecified: Secondary | ICD-10-CM | POA: Diagnosis not present

## 2018-01-06 DIAGNOSIS — E1140 Type 2 diabetes mellitus with diabetic neuropathy, unspecified: Secondary | ICD-10-CM | POA: Diagnosis not present

## 2018-01-07 ENCOUNTER — Other Ambulatory Visit: Payer: Self-pay

## 2018-01-07 DIAGNOSIS — N186 End stage renal disease: Secondary | ICD-10-CM | POA: Diagnosis not present

## 2018-01-07 DIAGNOSIS — N2581 Secondary hyperparathyroidism of renal origin: Secondary | ICD-10-CM | POA: Diagnosis not present

## 2018-01-07 DIAGNOSIS — D631 Anemia in chronic kidney disease: Secondary | ICD-10-CM | POA: Diagnosis not present

## 2018-01-07 DIAGNOSIS — E1165 Type 2 diabetes mellitus with hyperglycemia: Secondary | ICD-10-CM

## 2018-01-07 DIAGNOSIS — D509 Iron deficiency anemia, unspecified: Secondary | ICD-10-CM | POA: Diagnosis not present

## 2018-01-07 DIAGNOSIS — Z992 Dependence on renal dialysis: Secondary | ICD-10-CM | POA: Diagnosis not present

## 2018-01-07 DIAGNOSIS — IMO0002 Reserved for concepts with insufficient information to code with codable children: Secondary | ICD-10-CM

## 2018-01-07 DIAGNOSIS — E118 Type 2 diabetes mellitus with unspecified complications: Principal | ICD-10-CM

## 2018-01-07 NOTE — Telephone Encounter (Signed)
Please confirm her lancet type and meter brand for strips.

## 2018-01-07 NOTE — Telephone Encounter (Signed)
The pt called requesting that we send her diabetic supplies over to [place]. She contacted her insurance company and they informed her that her supplies will be covered at [employer].

## 2018-01-10 DIAGNOSIS — D631 Anemia in chronic kidney disease: Secondary | ICD-10-CM | POA: Diagnosis not present

## 2018-01-10 DIAGNOSIS — N2581 Secondary hyperparathyroidism of renal origin: Secondary | ICD-10-CM | POA: Diagnosis not present

## 2018-01-10 DIAGNOSIS — D509 Iron deficiency anemia, unspecified: Secondary | ICD-10-CM | POA: Diagnosis not present

## 2018-01-10 DIAGNOSIS — Z992 Dependence on renal dialysis: Secondary | ICD-10-CM | POA: Diagnosis not present

## 2018-01-10 DIAGNOSIS — N186 End stage renal disease: Secondary | ICD-10-CM | POA: Diagnosis not present

## 2018-01-11 ENCOUNTER — Ambulatory Visit
Payer: Medicare Other | Attending: Student in an Organized Health Care Education/Training Program | Admitting: Student in an Organized Health Care Education/Training Program

## 2018-01-11 ENCOUNTER — Encounter: Payer: Self-pay | Admitting: Student in an Organized Health Care Education/Training Program

## 2018-01-11 VITALS — BP 174/90 | HR 91 | Temp 98.2°F | Resp 16 | Ht 67.0 in | Wt 218.0 lb

## 2018-01-11 DIAGNOSIS — M5010 Cervical disc disorder with radiculopathy, unspecified cervical region: Secondary | ICD-10-CM | POA: Diagnosis not present

## 2018-01-11 DIAGNOSIS — E1151 Type 2 diabetes mellitus with diabetic peripheral angiopathy without gangrene: Secondary | ICD-10-CM | POA: Insufficient documentation

## 2018-01-11 DIAGNOSIS — F419 Anxiety disorder, unspecified: Secondary | ICD-10-CM | POA: Insufficient documentation

## 2018-01-11 DIAGNOSIS — Z940 Kidney transplant status: Secondary | ICD-10-CM | POA: Insufficient documentation

## 2018-01-11 DIAGNOSIS — E1165 Type 2 diabetes mellitus with hyperglycemia: Secondary | ICD-10-CM | POA: Insufficient documentation

## 2018-01-11 DIAGNOSIS — G894 Chronic pain syndrome: Secondary | ICD-10-CM

## 2018-01-11 DIAGNOSIS — F4325 Adjustment disorder with mixed disturbance of emotions and conduct: Secondary | ICD-10-CM | POA: Insufficient documentation

## 2018-01-11 DIAGNOSIS — F17210 Nicotine dependence, cigarettes, uncomplicated: Secondary | ICD-10-CM | POA: Insufficient documentation

## 2018-01-11 DIAGNOSIS — F341 Dysthymic disorder: Secondary | ICD-10-CM | POA: Insufficient documentation

## 2018-01-11 DIAGNOSIS — M47816 Spondylosis without myelopathy or radiculopathy, lumbar region: Secondary | ICD-10-CM

## 2018-01-11 DIAGNOSIS — E1122 Type 2 diabetes mellitus with diabetic chronic kidney disease: Secondary | ICD-10-CM | POA: Insufficient documentation

## 2018-01-11 DIAGNOSIS — M5412 Radiculopathy, cervical region: Secondary | ICD-10-CM | POA: Diagnosis not present

## 2018-01-11 DIAGNOSIS — M47812 Spondylosis without myelopathy or radiculopathy, cervical region: Secondary | ICD-10-CM | POA: Diagnosis not present

## 2018-01-11 DIAGNOSIS — L4050 Arthropathic psoriasis, unspecified: Secondary | ICD-10-CM | POA: Diagnosis not present

## 2018-01-11 DIAGNOSIS — N186 End stage renal disease: Secondary | ICD-10-CM | POA: Insufficient documentation

## 2018-01-11 DIAGNOSIS — R159 Full incontinence of feces: Secondary | ICD-10-CM | POA: Insufficient documentation

## 2018-01-11 DIAGNOSIS — H35033 Hypertensive retinopathy, bilateral: Secondary | ICD-10-CM | POA: Diagnosis not present

## 2018-01-11 DIAGNOSIS — E875 Hyperkalemia: Secondary | ICD-10-CM | POA: Insufficient documentation

## 2018-01-11 DIAGNOSIS — M546 Pain in thoracic spine: Secondary | ICD-10-CM | POA: Diagnosis not present

## 2018-01-11 DIAGNOSIS — Z794 Long term (current) use of insulin: Secondary | ICD-10-CM | POA: Insufficient documentation

## 2018-01-11 DIAGNOSIS — E11311 Type 2 diabetes mellitus with unspecified diabetic retinopathy with macular edema: Secondary | ICD-10-CM | POA: Insufficient documentation

## 2018-01-11 DIAGNOSIS — M47892 Other spondylosis, cervical region: Secondary | ICD-10-CM | POA: Insufficient documentation

## 2018-01-11 DIAGNOSIS — E1142 Type 2 diabetes mellitus with diabetic polyneuropathy: Secondary | ICD-10-CM | POA: Diagnosis not present

## 2018-01-11 DIAGNOSIS — K580 Irritable bowel syndrome with diarrhea: Secondary | ICD-10-CM | POA: Insufficient documentation

## 2018-01-11 DIAGNOSIS — K449 Diaphragmatic hernia without obstruction or gangrene: Secondary | ICD-10-CM | POA: Diagnosis not present

## 2018-01-11 DIAGNOSIS — I132 Hypertensive heart and chronic kidney disease with heart failure and with stage 5 chronic kidney disease, or end stage renal disease: Secondary | ICD-10-CM | POA: Diagnosis not present

## 2018-01-11 DIAGNOSIS — E7800 Pure hypercholesterolemia, unspecified: Secondary | ICD-10-CM | POA: Insufficient documentation

## 2018-01-11 DIAGNOSIS — R270 Ataxia, unspecified: Secondary | ICD-10-CM | POA: Insufficient documentation

## 2018-01-11 DIAGNOSIS — D649 Anemia, unspecified: Secondary | ICD-10-CM | POA: Insufficient documentation

## 2018-01-11 DIAGNOSIS — M5030 Other cervical disc degeneration, unspecified cervical region: Secondary | ICD-10-CM | POA: Diagnosis not present

## 2018-01-11 DIAGNOSIS — J449 Chronic obstructive pulmonary disease, unspecified: Secondary | ICD-10-CM | POA: Insufficient documentation

## 2018-01-11 DIAGNOSIS — Z79899 Other long term (current) drug therapy: Secondary | ICD-10-CM | POA: Diagnosis not present

## 2018-01-11 DIAGNOSIS — I509 Heart failure, unspecified: Secondary | ICD-10-CM | POA: Insufficient documentation

## 2018-01-11 DIAGNOSIS — M542 Cervicalgia: Secondary | ICD-10-CM | POA: Diagnosis not present

## 2018-01-11 DIAGNOSIS — R109 Unspecified abdominal pain: Secondary | ICD-10-CM | POA: Insufficient documentation

## 2018-01-11 DIAGNOSIS — M25511 Pain in right shoulder: Secondary | ICD-10-CM | POA: Diagnosis not present

## 2018-01-11 MED ORDER — GLUCOSE BLOOD VI STRP: ORAL_STRIP | 12 refills | Status: DC

## 2018-01-11 MED ORDER — "INSULIN SYRINGE 27G X 1/2"" 1 ML MISC": 1.0000 | Freq: Four times a day (QID) | 4 refills | Status: DC

## 2018-01-11 NOTE — Addendum Note (Signed)
Addended by: Wilson Singer on: 01/11/2018 04:56 PM   Modules accepted: Orders

## 2018-01-11 NOTE — Progress Notes (Signed)
Nursing Pain Medication Assessment:  Safety precautions to be maintained throughout the outpatient stay will include: orient to surroundings, keep bed in low position, maintain call bell within reach at all times, provide assistance with transfer out of bed and ambulation.  Medication Inspection Compliance: Pill count conducted under aseptic conditions, in front of the patient. Neither the pills nor the bottle was removed from the patient's sight at any time. Once count was completed pills were immediately returned to the patient in their original bottle.  Medication: Oxycodone IR Pill/Patch Count: lost pills Pill/Patch Appearance: Markings consistent with prescribed medication Bottle Appearance: Standard pharmacy container. Clearly labeled. Last Medication intake:  ?   Patient states that there was a leak at her apt and she thinks her pills were thrown away.  There was a boyfriend in the home at the time and patient reports that she was hospitalized from 12/14/17 - 12/18/17.  States that it was an unopened pill bottle.
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
## 2018-01-12 DIAGNOSIS — Z992 Dependence on renal dialysis: Secondary | ICD-10-CM | POA: Diagnosis not present

## 2018-01-12 DIAGNOSIS — I509 Heart failure, unspecified: Secondary | ICD-10-CM | POA: Diagnosis not present

## 2018-01-12 DIAGNOSIS — J439 Emphysema, unspecified: Secondary | ICD-10-CM | POA: Diagnosis not present

## 2018-01-12 DIAGNOSIS — N186 End stage renal disease: Secondary | ICD-10-CM | POA: Diagnosis not present

## 2018-01-12 DIAGNOSIS — E1122 Type 2 diabetes mellitus with diabetic chronic kidney disease: Secondary | ICD-10-CM | POA: Diagnosis not present

## 2018-01-12 DIAGNOSIS — I1310 Hypertensive heart and chronic kidney disease without heart failure, with stage 1 through stage 4 chronic kidney disease, or unspecified chronic kidney disease: Secondary | ICD-10-CM | POA: Diagnosis not present

## 2018-01-12 DIAGNOSIS — E1140 Type 2 diabetes mellitus with diabetic neuropathy, unspecified: Secondary | ICD-10-CM | POA: Diagnosis not present

## 2018-01-12 DIAGNOSIS — D631 Anemia in chronic kidney disease: Secondary | ICD-10-CM | POA: Diagnosis not present

## 2018-01-12 DIAGNOSIS — D509 Iron deficiency anemia, unspecified: Secondary | ICD-10-CM | POA: Diagnosis not present

## 2018-01-12 DIAGNOSIS — N2581 Secondary hyperparathyroidism of renal origin: Secondary | ICD-10-CM | POA: Diagnosis not present

## 2018-01-14 DIAGNOSIS — N186 End stage renal disease: Secondary | ICD-10-CM | POA: Diagnosis not present

## 2018-01-14 DIAGNOSIS — D509 Iron deficiency anemia, unspecified: Secondary | ICD-10-CM | POA: Diagnosis not present

## 2018-01-14 DIAGNOSIS — N2581 Secondary hyperparathyroidism of renal origin: Secondary | ICD-10-CM | POA: Diagnosis not present

## 2018-01-14 DIAGNOSIS — Z992 Dependence on renal dialysis: Secondary | ICD-10-CM | POA: Diagnosis not present

## 2018-01-14 DIAGNOSIS — D631 Anemia in chronic kidney disease: Secondary | ICD-10-CM | POA: Diagnosis not present

## 2018-01-17 DIAGNOSIS — N2581 Secondary hyperparathyroidism of renal origin: Secondary | ICD-10-CM | POA: Diagnosis not present

## 2018-01-17 DIAGNOSIS — D509 Iron deficiency anemia, unspecified: Secondary | ICD-10-CM | POA: Diagnosis not present

## 2018-01-17 DIAGNOSIS — D631 Anemia in chronic kidney disease: Secondary | ICD-10-CM | POA: Diagnosis not present

## 2018-01-17 DIAGNOSIS — Z794 Long term (current) use of insulin: Secondary | ICD-10-CM | POA: Diagnosis not present

## 2018-01-17 DIAGNOSIS — E119 Type 2 diabetes mellitus without complications: Secondary | ICD-10-CM | POA: Diagnosis not present

## 2018-01-17 DIAGNOSIS — Z992 Dependence on renal dialysis: Secondary | ICD-10-CM | POA: Diagnosis not present

## 2018-01-17 DIAGNOSIS — N186 End stage renal disease: Secondary | ICD-10-CM | POA: Diagnosis not present

## 2018-01-18 DIAGNOSIS — I1310 Hypertensive heart and chronic kidney disease without heart failure, with stage 1 through stage 4 chronic kidney disease, or unspecified chronic kidney disease: Secondary | ICD-10-CM | POA: Diagnosis not present

## 2018-01-18 DIAGNOSIS — I509 Heart failure, unspecified: Secondary | ICD-10-CM | POA: Diagnosis not present

## 2018-01-18 DIAGNOSIS — N186 End stage renal disease: Secondary | ICD-10-CM | POA: Diagnosis not present

## 2018-01-18 DIAGNOSIS — E1122 Type 2 diabetes mellitus with diabetic chronic kidney disease: Secondary | ICD-10-CM | POA: Diagnosis not present

## 2018-01-18 DIAGNOSIS — J439 Emphysema, unspecified: Secondary | ICD-10-CM | POA: Diagnosis not present

## 2018-01-18 DIAGNOSIS — E1140 Type 2 diabetes mellitus with diabetic neuropathy, unspecified: Secondary | ICD-10-CM | POA: Diagnosis not present

## 2018-01-19 DIAGNOSIS — N186 End stage renal disease: Secondary | ICD-10-CM | POA: Diagnosis not present

## 2018-01-19 DIAGNOSIS — N2581 Secondary hyperparathyroidism of renal origin: Secondary | ICD-10-CM | POA: Diagnosis not present

## 2018-01-19 DIAGNOSIS — Z992 Dependence on renal dialysis: Secondary | ICD-10-CM | POA: Diagnosis not present

## 2018-01-19 DIAGNOSIS — D631 Anemia in chronic kidney disease: Secondary | ICD-10-CM | POA: Diagnosis not present

## 2018-01-19 DIAGNOSIS — D509 Iron deficiency anemia, unspecified: Secondary | ICD-10-CM | POA: Diagnosis not present

## 2018-01-20 DIAGNOSIS — N186 End stage renal disease: Secondary | ICD-10-CM | POA: Diagnosis not present

## 2018-01-20 DIAGNOSIS — Z992 Dependence on renal dialysis: Secondary | ICD-10-CM | POA: Diagnosis not present

## 2018-01-21 DIAGNOSIS — R509 Fever, unspecified: Secondary | ICD-10-CM | POA: Diagnosis not present

## 2018-01-21 DIAGNOSIS — R6883 Chills (without fever): Secondary | ICD-10-CM | POA: Diagnosis not present

## 2018-01-21 DIAGNOSIS — J069 Acute upper respiratory infection, unspecified: Secondary | ICD-10-CM | POA: Diagnosis not present

## 2018-01-21 DIAGNOSIS — D509 Iron deficiency anemia, unspecified: Secondary | ICD-10-CM | POA: Diagnosis not present

## 2018-01-21 DIAGNOSIS — Z992 Dependence on renal dialysis: Secondary | ICD-10-CM | POA: Diagnosis not present

## 2018-01-21 DIAGNOSIS — N2581 Secondary hyperparathyroidism of renal origin: Secondary | ICD-10-CM | POA: Diagnosis not present

## 2018-01-21 DIAGNOSIS — D631 Anemia in chronic kidney disease: Secondary | ICD-10-CM | POA: Diagnosis not present

## 2018-01-21 DIAGNOSIS — N186 End stage renal disease: Secondary | ICD-10-CM | POA: Diagnosis not present

## 2018-01-24 ENCOUNTER — Ambulatory Visit: Payer: Medicare Other | Admitting: Student in an Organized Health Care Education/Training Program

## 2018-01-24 DIAGNOSIS — D631 Anemia in chronic kidney disease: Secondary | ICD-10-CM | POA: Diagnosis not present

## 2018-01-24 DIAGNOSIS — N186 End stage renal disease: Secondary | ICD-10-CM | POA: Diagnosis not present

## 2018-01-24 DIAGNOSIS — D509 Iron deficiency anemia, unspecified: Secondary | ICD-10-CM | POA: Diagnosis not present

## 2018-01-24 DIAGNOSIS — R509 Fever, unspecified: Secondary | ICD-10-CM | POA: Diagnosis not present

## 2018-01-24 DIAGNOSIS — N2581 Secondary hyperparathyroidism of renal origin: Secondary | ICD-10-CM | POA: Diagnosis not present

## 2018-01-24 DIAGNOSIS — Z992 Dependence on renal dialysis: Secondary | ICD-10-CM | POA: Diagnosis not present

## 2018-01-25 ENCOUNTER — Ambulatory Visit (INDEPENDENT_AMBULATORY_CARE_PROVIDER_SITE_OTHER): Payer: Medicare Other | Admitting: Vascular Surgery

## 2018-01-25 ENCOUNTER — Encounter (INDEPENDENT_AMBULATORY_CARE_PROVIDER_SITE_OTHER): Payer: Medicare Other

## 2018-01-25 ENCOUNTER — Other Ambulatory Visit (INDEPENDENT_AMBULATORY_CARE_PROVIDER_SITE_OTHER): Payer: Medicare Other

## 2018-01-26 ENCOUNTER — Other Ambulatory Visit
Admission: RE | Admit: 2018-01-26 | Discharge: 2018-01-26 | Disposition: A | Discharge status: Home / Self Care | Payer: Medicare Other | Source: Ambulatory Visit | Attending: Nephrology | Admitting: Nephrology

## 2018-01-26 DIAGNOSIS — N186 End stage renal disease: Secondary | ICD-10-CM | POA: Diagnosis not present

## 2018-01-26 DIAGNOSIS — D631 Anemia in chronic kidney disease: Secondary | ICD-10-CM | POA: Diagnosis not present

## 2018-01-26 DIAGNOSIS — R509 Fever, unspecified: Secondary | ICD-10-CM | POA: Diagnosis not present

## 2018-01-26 DIAGNOSIS — Z992 Dependence on renal dialysis: Secondary | ICD-10-CM | POA: Diagnosis not present

## 2018-01-26 DIAGNOSIS — D509 Iron deficiency anemia, unspecified: Secondary | ICD-10-CM | POA: Diagnosis not present

## 2018-01-26 DIAGNOSIS — N2581 Secondary hyperparathyroidism of renal origin: Secondary | ICD-10-CM | POA: Diagnosis not present

## 2018-01-26 LAB — INFLUENZA PANEL BY PCR (TYPE A & B)
INFLAPCR: NEGATIVE
Influenza B By PCR: NEGATIVE

## 2018-01-27 ENCOUNTER — Encounter: Payer: Self-pay | Admitting: Nurse Practitioner

## 2018-01-27 ENCOUNTER — Other Ambulatory Visit: Payer: Self-pay

## 2018-01-27 ENCOUNTER — Ambulatory Visit (INDEPENDENT_AMBULATORY_CARE_PROVIDER_SITE_OTHER): Payer: Medicare Other | Admitting: Nurse Practitioner

## 2018-01-27 VITALS — BP 120/82 | HR 79 | Temp 99.3°F | Resp 18 | Ht 67.0 in | Wt 209.6 lb

## 2018-01-27 DIAGNOSIS — Z789 Other specified health status: Secondary | ICD-10-CM | POA: Diagnosis not present

## 2018-01-27 DIAGNOSIS — R42 Dizziness and giddiness: Secondary | ICD-10-CM | POA: Diagnosis not present

## 2018-01-27 DIAGNOSIS — J00 Acute nasopharyngitis [common cold]: Secondary | ICD-10-CM | POA: Diagnosis not present

## 2018-01-27 DIAGNOSIS — Z0289 Encounter for other administrative examinations: Secondary | ICD-10-CM

## 2018-01-27 LAB — GLUCOSE, POCT (MANUAL RESULT ENTRY): POC Glucose: 237 mg/dl — AB (ref 70–99)

## 2018-01-27 MED ORDER — GUAIFENESIN 200 MG PO TABS: 200.0000 mg | ORAL_TABLET | ORAL | 0 refills | Status: DC | PRN

## 2018-01-27 MED ORDER — IPRATROPIUM BROMIDE 0.06 % NA SOLN: 2.0000 | Freq: Four times a day (QID) | NASAL | 0 refills | Status: DC

## 2018-01-27 MED ORDER — LEVOCETIRIZINE DIHYDROCHLORIDE 5 MG PO TABS: 5.0000 mg | ORAL_TABLET | Freq: Every evening | ORAL | 2 refills | Status: DC

## 2018-01-27 MED ORDER — TRIAMCINOLONE ACETONIDE 55 MCG/ACT NA AERO: 2.0000 | INHALATION_SPRAY | Freq: Every day | NASAL | 12 refills | Status: DC

## 2018-01-27 NOTE — Progress Notes (Signed)
Subjective:    Patient ID: [name], female    DOB: Oct 10, 1966, 52 y.o.   MRN: [id number]  [name] is a 52 y.o. female presenting on 01/27/2018 for Form Completion (FL-2 form) and Nasal Congestion (fever 101.7 x 3 days ago, pt was given Vancomycin and influenza test ran yesterday, no results.)   HPI  Vascular Access Still having difficulty with HD access.  Vascular and renal are continuing to follow and manage. - Flow rates vascular specialist- will discuss w/ Dr. Lucky Cowboy. - Clot 3-4 days.  URI Nasal congestion w/ fever 101.7 deg farenheit 3 days ago.  Associated symptoms include mild cough, body aches, rhinorrhea.  Started on vanc x 1 dose during HD.  Neg flu test yesterday at HD.  Denies any nausea, vomiting, diarrhea/constipation different from usual.  Today is slightly improved.  Dizziness - Dizziness with vision changes: Pt with recurrent dizziness and visual changes in last 3-4 days.  Eustachian tube dysfunction is also likely contributing to these symptoms as BP recheck 120/82 and CBG 237 in office during symptomatic episode.  FORM COMPLETION - FL2 ([place]) Indication: Renewal of prior FL2 Initial start date of services / admission: unknown Date of completed form: 02/16/18  - Date of last office visits: 02/16/18  Admitting Diagnosis / Medical Diagnosis List - ICD10 Patient Active Problem List   Diagnosis Date Noted  . Acute respiratory failure ([place]) 12/15/2017  . DDD (degenerative disc disease), cervical 11/23/2017  . Abdominal pain, chronic, right lower quadrant 09/14/2017  . Diarrhea 09/14/2017  . Stool incontinence 08/19/2017  . Irritable bowel syndrome (IBS) 07/20/2017  . Chronic bilateral low back pain 07/20/2017  . Anxiety 07/02/2017  . Dialysis AV fistula malfunction ([place]) 05/19/2017  . Hyperkalemia 01/08/2017  . Diabetes mellitus type 2, uncontrolled, with complications ([place]) 79/89/2119  . SMA stenosis ([place]) 12/18/2016  . Difficulty walking  11/26/2016  . Neuropathy due to type 2 diabetes mellitus ([place]) 11/26/2016  . Neck pain 11/26/2016  . Kidney transplant candidate 08/11/2016  . History of MRSA infection 07/18/2016  . Acquired palmar and plantar hyperkeratosis 07/18/2016  . Essential hypertension 07/18/2016  . Tobacco abuse 07/18/2016  . ESRD (end stage renal disease) on dialysis ([place]) 07/18/2016  . Adjustment disorder with mixed disturbance of emotions and conduct 05/11/2016  . Dysthymia 05/11/2016  . Diabetic macular edema ([place]) 08/24/2013  . Hypertensive retinopathy of both eyes 08/24/2013  . Non-proliferative diabetic retinopathy, severe, both eyes ([place]) 08/24/2013  . Psoriatic arthropathy ([place]) 11/14/2012    Patient Information: - Oriented  - Semi ambulatory - Incontinent (Bowel) - Inappropriate Behavior (None) - Functional Limitations ([place]) - Communication of needs: Verbal  - Respiration: Normal - Personal Care Assistance - Assitsted (bathing, feeding, dressing) - Active - Skin Normal - Nutrition: Renal diet  Outpatient Encounter Medications as of 01/27/2018  Medication Sig Note  . b complex-vitamin c-folic acid (NEPHRO-VITE) 0.8 MG TABS tablet Take 1 tablet by mouth daily.    . calcium acetate (PHOSLO) 667 MG capsule Take 1,334 mg by mouth 3 (three) times daily with meals.    . clobetasol ointment (TEMOVATE) 4.17 % Apply 1 application topically daily as needed (for psorasis).    Marland Kitchen diltiazem (TIAZAC) 120 MG 24 hr capsule Take 240 mg by mouth 2 (two) times daily.    . ferrous sulfate 325 (65 FE) MG tablet Take 325 mg by mouth daily with breakfast.   . Glecaprevir-Pibrentasvir (MAVYRET) 100-40 MG TABS Take 3 tablets by mouth daily.   Marland Kitchen  glucose 4 GM chewable tablet Chew 1 tablet (4 g total) by mouth as needed for low blood sugar.   Marland Kitchen glucose blood (TRUE METRIX BLOOD GLUCOSE TEST) test strip Use as instructed   . insulin aspart (NOVOLOG) 100 UNIT/ML injection Inject 1-2 Units into the skin 3 (three) times  daily before meals.   . insulin glargine (LANTUS) 100 UNIT/ML injection Inject 15 Units into the skin daily.    . Insulin Syringe 27G X 1/2" 1 ML MISC 1 Syringe by Does not apply route 4 (four) times daily.   Marland Kitchen loperamide (IMODIUM A-D) 2 MG tablet Take 2 mg by mouth 3 (three) times daily as needed.    . midodrine (PROAMATINE) 5 MG tablet Take 5 mg by mouth as needed (when blood pressure is low).   Marland Kitchen omeprazole (PRILOSEC) 40 MG capsule Take 1 capsule by mouth daily.   . promethazine (PHENERGAN) 25 MG tablet Take 1 tablet (25 mg total) by mouth every 4 (four) hours as needed for nausea or vomiting.   . sevelamer carbonate (RENVELA) 800 MG tablet Take 1,600 mg by mouth 3 (three) times daily with meals.   Marland Kitchen guaiFENesin 200 MG tablet Take 1 tablet (200 mg total) by mouth every 4 (four) hours as needed for cough or to loosen phlegm.   Marland Kitchen ipratropium (ATROVENT) 0.06 % nasal spray Place 2 sprays into both nostrils 4 (four) times daily.   Marland Kitchen levocetirizine (XYZAL) 5 MG tablet Take 1 tablet (5 mg total) by mouth every evening.   Marland Kitchen oxyCODONE (OXY IR/ROXICODONE) 5 MG immediate release tablet Take 1 tablet (5 mg total) by mouth 3 (three) times daily as needed for severe pain. For chronic pain To fill on or after: 11/17/17, 12/16/17 To last 30 days from fill date (Patient not taking: Reported on 01/27/2018) 01/11/2018: Patient states medications are lost or stolen.  . triamcinolone (NASACORT) 55 MCG/ACT AERO nasal inhaler Place 2 sprays into the nose daily.    No facility-administered encounter medications on file as of 01/27/2018.     Form completed, signed, dated and faxed to social worker, copy given to patient, copy kept for our chart.   Social History   Tobacco Use  . Smoking status: Light Tobacco Smoker    Packs/day: 0.25    Years: 20.00    Pack years: 5.00    Types: Cigarettes    Last attempt to quit: 05/21/2017    Years since quitting: 0.7  . Smokeless tobacco: Never Used  Substance Use Topics  .  Alcohol use: No  . Drug use: No    Review of Systems Per HPI unless specifically indicated above     Objective:    BP 120/82 (BP Location: Right Arm, Patient Position: Sitting, Cuff Size: Normal)   Pulse 79   Temp 99.3 F (37.4 C) (Oral)   Resp 18   Ht 5\' 7"  (1.702 m)   Wt 209 lb 9.6 oz (95.1 kg)   SpO2 100%   BMI 32.83 kg/m   Wt Readings from Last 3 Encounters:  01/27/18 209 lb 9.6 oz (95.1 kg)  01/11/18 218 lb (98.9 kg)  12/27/17 218 lb (98.9 kg)    Physical Exam  Constitutional: She appears well-developed and well-nourished. No distress (mildly).  HENT:  Head: Normocephalic and atraumatic.  Right Ear: Hearing, external ear and ear canal normal. Tympanic membrane is retracted. Tympanic membrane is not injected and not erythematous. No middle ear effusion.  Left Ear: Hearing, external ear and ear canal normal.  Tympanic membrane is retracted. Tympanic membrane is not injected and not erythematous.  No middle ear effusion.  Nose: Mucosal edema and rhinorrhea present. Right sinus exhibits maxillary sinus tenderness and frontal sinus tenderness. Left sinus exhibits maxillary sinus tenderness and frontal sinus tenderness.  Mouth/Throat: Uvula is midline and mucous membranes are normal. Posterior oropharyngeal edema (cobblestoning) present. Oropharyngeal exudate: clear secretions.  Neck: Normal range of motion. Neck supple.  Cardiovascular: Normal rate, regular rhythm, S1 normal, S2 normal, normal heart sounds and intact distal pulses.  Pulmonary/Chest: Effort normal and breath sounds normal. No respiratory distress.  Lymphadenopathy:    She has cervical adenopathy.  Neurological: She is alert. Gait (antalgic - uses single prong cane) abnormal.  Skin: Skin is warm and dry.  Psoriatic rash on elbows - raised, erythematous patches with silvery scales  Psychiatric: She has a normal mood and affect. Her behavior is normal.  Vitals reviewed.    Results for orders placed or  performed in visit on 01/27/18  POCT Glucose (CBG)  Result Value Ref Range   POC Glucose 237 (A) 70 - 99 mg/dl      Assessment & Plan:   Problem List Items Addressed This Visit    None    Visit Diagnoses    Acute nasopharyngitis    -  Primary Acute illness. Fever responsive to NSAIDs and tylenol.  Symptoms not worsening. Consistent with viral illness x 3 days with known sick contacts and no identifiable focal infections of ears, nose, throat.  Plan: 1. Reassurance, likely self-limited with cough lasting up to few weeks - Start atrovent nasal spray decongestant 2 sprays each nostril up to 4 times daily for 5-7 days - Start anti-histamine Xyzal 5mg  daily,  - also can use Nasacort 2 sprays each nostril daily for up to 4-6 weeks - Start Mucinex-DM OTC up to 7-10 days then stop 2. Supportive care with nasal saline, warm herbal tea with honey, 3. Improve hydration 4. Tylenol / Motrin PRN fevers 5. Return criteria given    Relevant Medications   levocetirizine (XYZAL) 5 MG tablet   triamcinolone (NASACORT) 55 MCG/ACT AERO nasal inhaler   guaiFENesin 200 MG tablet   ipratropium (ATROVENT) 0.06 % nasal spray   Dizziness     Likely secondary to URI symptoms.  Pt also felt as if CBG was low, so checked value in clinic during symptoms.  BP normal and CBG hyperglycemic.   - Encouraged pt to continue monitoring symptoms and if persists after URI resolves, may need additional workup.  Lab Results  Component Value Date   UUVOZD 664 (A) 01/27/2018     Relevant Orders   POCT Glucose (CBG) (Completed)   Encounter for completion of form with patient     Pt with need to have FL2 completed for domicilary location for continuation of current housing arrangement.  Form completed today as above.    Problem with vascular access     Pt continues to have difficulty with HD access.  Dr. Lucky Cowboy to followup again w/ pt in next several weeks for ongoing interventions.  Followup as needed.      Meds  ordered this encounter  Medications  . levocetirizine (XYZAL) 5 MG tablet    Sig: Take 1 tablet (5 mg total) by mouth every evening.    Dispense:  30 tablet    Refill:  2    Order Specific Question:   Supervising Provider    Answer:   Olin Hauser [[id number]]  . triamcinolone (NASACORT)  55 MCG/ACT AERO nasal inhaler    Sig: Place 2 sprays into the nose daily.    Dispense:  1 Inhaler    Refill:  12    Order Specific Question:   Supervising Provider    Answer:   Olin Hauser [[id number]]  . guaiFENesin 200 MG tablet    Sig: Take 1 tablet (200 mg total) by mouth every 4 (four) hours as needed for cough or to loosen phlegm.    Dispense:  30 suppository    Refill:  0    Order Specific Question:   Supervising Provider    Answer:   Olin Hauser [[id number]]  . ipratropium (ATROVENT) 0.06 % nasal spray    Sig: Place 2 sprays into both nostrils 4 (four) times daily.    Dispense:  15 mL    Refill:  0    Order Specific Question:   Supervising Provider    Answer:   Olin Hauser [[id number]]      Follow up plan: Return for your cold and infection symptoms if symptoms worsen or fail to improve, for AND as scheduled in April.   Cassell Smiles, DNP, AGPCNP-BC Adult Gerontology Primary Care Nurse Practitioner [facility] 02/16/2018, 8:47 PM

## 2018-01-27 NOTE — Patient Instructions (Addendum)
[name], Thank you for coming in to clinic today.  1. It sounds like you have a Upper Respiratory Virus - this will most likely run it's course in 7 to 10 days. Recommend good hand washing. - Start Atrovent nasal spray decongestant 2 sprays each nostril up to 4 times daily for 5-7 days - Start anti-histamine levocetirizine 5 mg daily.  If not covered may use loratadine 10mg  daily, also can use Flonase 2 sprays each nostril daily for up to 4-6 weeks - If congestion is worse, start OTC Mucinex (or may try Mucinex-DM for cough) up to 7-10 days then stop - Drink plenty of fluids to improve congestion - You may try over the counter Nasal Saline spray (Simply Saline, Ocean Spray) as needed to reduce congestion. - Drink warm herbal tea with honey for sore throat. - Start taking Tylenol extra strength 1 to 2 tablets every 6-8 hours for aches or fever/chills for next few days as needed.  Do not take more than 3,000 mg in 24 hours from all medicines.  May take Ibuprofen as well if tolerated 200-400mg  every 8 hours as needed.  If symptoms significantly worsening with persistent fevers/chills despite tylenol/ibpurofen, nausea, vomiting unable to tolerate food/fluids or medicine, body aches, or shortness of breath, sinus pain pressure or worsening productive cough, then follow-up for re-evaluation, may seek more immediate care at Urgent Care or ED if more concerned for emergency.  Please schedule a follow-up appointment with Cassell Smiles, AGNP. Return for your cold and infection symptoms if symptoms worsen or fail to improve, for AND as scheduled in April.  If you have any other questions or concerns, please feel free to call the clinic or send a message through [place]. You may also schedule an earlier appointment if necessary.  You will receive a survey after today's visit either digitally by e-mail or paper by [employer]. Your experiences and feedback matter to [place].  Please respond so we know how we are doing  as we provide care for you.   Cassell Smiles, DNP, AGNP-BC Adult Gerontology Nurse Practitioner [place]

## 2018-01-28 DIAGNOSIS — D509 Iron deficiency anemia, unspecified: Secondary | ICD-10-CM | POA: Diagnosis not present

## 2018-01-28 DIAGNOSIS — N2581 Secondary hyperparathyroidism of renal origin: Secondary | ICD-10-CM | POA: Diagnosis not present

## 2018-01-28 DIAGNOSIS — Z992 Dependence on renal dialysis: Secondary | ICD-10-CM | POA: Diagnosis not present

## 2018-01-28 DIAGNOSIS — R509 Fever, unspecified: Secondary | ICD-10-CM | POA: Diagnosis not present

## 2018-01-28 DIAGNOSIS — N186 End stage renal disease: Secondary | ICD-10-CM | POA: Diagnosis not present

## 2018-01-28 DIAGNOSIS — D631 Anemia in chronic kidney disease: Secondary | ICD-10-CM | POA: Diagnosis not present

## 2018-01-31 DIAGNOSIS — D631 Anemia in chronic kidney disease: Secondary | ICD-10-CM | POA: Diagnosis not present

## 2018-01-31 DIAGNOSIS — D509 Iron deficiency anemia, unspecified: Secondary | ICD-10-CM | POA: Diagnosis not present

## 2018-01-31 DIAGNOSIS — N186 End stage renal disease: Secondary | ICD-10-CM | POA: Diagnosis not present

## 2018-01-31 DIAGNOSIS — Z992 Dependence on renal dialysis: Secondary | ICD-10-CM | POA: Diagnosis not present

## 2018-01-31 DIAGNOSIS — N2581 Secondary hyperparathyroidism of renal origin: Secondary | ICD-10-CM | POA: Diagnosis not present

## 2018-01-31 DIAGNOSIS — R509 Fever, unspecified: Secondary | ICD-10-CM | POA: Diagnosis not present

## 2018-02-02 DIAGNOSIS — R509 Fever, unspecified: Secondary | ICD-10-CM | POA: Diagnosis not present

## 2018-02-02 DIAGNOSIS — N2581 Secondary hyperparathyroidism of renal origin: Secondary | ICD-10-CM | POA: Diagnosis not present

## 2018-02-02 DIAGNOSIS — Z992 Dependence on renal dialysis: Secondary | ICD-10-CM | POA: Diagnosis not present

## 2018-02-02 DIAGNOSIS — N186 End stage renal disease: Secondary | ICD-10-CM | POA: Diagnosis not present

## 2018-02-02 DIAGNOSIS — D509 Iron deficiency anemia, unspecified: Secondary | ICD-10-CM | POA: Diagnosis not present

## 2018-02-02 DIAGNOSIS — D631 Anemia in chronic kidney disease: Secondary | ICD-10-CM | POA: Diagnosis not present

## 2018-02-05 DIAGNOSIS — N186 End stage renal disease: Secondary | ICD-10-CM | POA: Diagnosis not present

## 2018-02-05 DIAGNOSIS — D631 Anemia in chronic kidney disease: Secondary | ICD-10-CM | POA: Diagnosis not present

## 2018-02-05 DIAGNOSIS — Z992 Dependence on renal dialysis: Secondary | ICD-10-CM | POA: Diagnosis not present

## 2018-02-05 DIAGNOSIS — R509 Fever, unspecified: Secondary | ICD-10-CM | POA: Diagnosis not present

## 2018-02-05 DIAGNOSIS — N2581 Secondary hyperparathyroidism of renal origin: Secondary | ICD-10-CM | POA: Diagnosis not present

## 2018-02-05 DIAGNOSIS — D509 Iron deficiency anemia, unspecified: Secondary | ICD-10-CM | POA: Diagnosis not present

## 2018-02-07 DIAGNOSIS — N186 End stage renal disease: Secondary | ICD-10-CM | POA: Diagnosis not present

## 2018-02-07 DIAGNOSIS — D509 Iron deficiency anemia, unspecified: Secondary | ICD-10-CM | POA: Diagnosis not present

## 2018-02-07 DIAGNOSIS — N2581 Secondary hyperparathyroidism of renal origin: Secondary | ICD-10-CM | POA: Diagnosis not present

## 2018-02-07 DIAGNOSIS — D631 Anemia in chronic kidney disease: Secondary | ICD-10-CM | POA: Diagnosis not present

## 2018-02-07 DIAGNOSIS — R509 Fever, unspecified: Secondary | ICD-10-CM | POA: Diagnosis not present

## 2018-02-07 DIAGNOSIS — Z992 Dependence on renal dialysis: Secondary | ICD-10-CM | POA: Diagnosis not present

## 2018-02-09 DIAGNOSIS — D509 Iron deficiency anemia, unspecified: Secondary | ICD-10-CM | POA: Diagnosis not present

## 2018-02-09 DIAGNOSIS — Z992 Dependence on renal dialysis: Secondary | ICD-10-CM | POA: Diagnosis not present

## 2018-02-09 DIAGNOSIS — D631 Anemia in chronic kidney disease: Secondary | ICD-10-CM | POA: Diagnosis not present

## 2018-02-09 DIAGNOSIS — N186 End stage renal disease: Secondary | ICD-10-CM | POA: Diagnosis not present

## 2018-02-09 DIAGNOSIS — R509 Fever, unspecified: Secondary | ICD-10-CM | POA: Diagnosis not present

## 2018-02-09 DIAGNOSIS — N2581 Secondary hyperparathyroidism of renal origin: Secondary | ICD-10-CM | POA: Diagnosis not present

## 2018-02-10 ENCOUNTER — Telehealth: Payer: Self-pay

## 2018-02-10 DIAGNOSIS — I10 Essential (primary) hypertension: Secondary | ICD-10-CM

## 2018-02-10 DIAGNOSIS — N186 End stage renal disease: Secondary | ICD-10-CM

## 2018-02-10 DIAGNOSIS — L409 Psoriasis, unspecified: Secondary | ICD-10-CM

## 2018-02-10 DIAGNOSIS — R0989 Other specified symptoms and signs involving the circulatory and respiratory systems: Secondary | ICD-10-CM

## 2018-02-10 DIAGNOSIS — T82590D Other mechanical complication of surgically created arteriovenous fistula, subsequent encounter: Secondary | ICD-10-CM

## 2018-02-10 DIAGNOSIS — L4050 Arthropathic psoriasis, unspecified: Secondary | ICD-10-CM

## 2018-02-10 DIAGNOSIS — Z992 Dependence on renal dialysis: Secondary | ICD-10-CM

## 2018-02-10 NOTE — Telephone Encounter (Signed)
The pt called requesting a new referral to a different Dermatologist, because she arrived late to her dermatology appt today at [facility] and they rescheduled her to April,  She is also request a referral to cardiology, because her blood pressure keeps dropping doing her Dialysis treatment. She feel like the dialysis center is taking to long to get her referred. She is a patient of Dr. Lucky Cowboy vein & vascular and really doesn't like seeing him, because she have to travel out of town to be seen at another location.

## 2018-02-10 NOTE — Addendum Note (Signed)
Addended by: Cassell Smiles R on: 02/10/2018 11:23 AM   Modules accepted: Orders

## 2018-02-10 NOTE — Telephone Encounter (Signed)
Referrals made:  1. Dermatology  - recommend she keep April appointment.  It may take that long to get in at another office. 2. Sent new referral to [facility]: Dr. Donnetta Hutching.  She will need to arrange alternative transportation out of [place] to go here, however. 3. Cardiology: [facility] for BP lability  All can be reviewed in last clinic notes.

## 2018-02-11 DIAGNOSIS — D631 Anemia in chronic kidney disease: Secondary | ICD-10-CM | POA: Diagnosis not present

## 2018-02-11 DIAGNOSIS — Z992 Dependence on renal dialysis: Secondary | ICD-10-CM | POA: Diagnosis not present

## 2018-02-11 DIAGNOSIS — R509 Fever, unspecified: Secondary | ICD-10-CM | POA: Diagnosis not present

## 2018-02-11 DIAGNOSIS — N2581 Secondary hyperparathyroidism of renal origin: Secondary | ICD-10-CM | POA: Diagnosis not present

## 2018-02-11 DIAGNOSIS — N186 End stage renal disease: Secondary | ICD-10-CM | POA: Diagnosis not present

## 2018-02-11 DIAGNOSIS — D509 Iron deficiency anemia, unspecified: Secondary | ICD-10-CM | POA: Diagnosis not present

## 2018-02-14 DIAGNOSIS — D509 Iron deficiency anemia, unspecified: Secondary | ICD-10-CM | POA: Diagnosis not present

## 2018-02-14 DIAGNOSIS — N186 End stage renal disease: Secondary | ICD-10-CM | POA: Diagnosis not present

## 2018-02-14 DIAGNOSIS — R509 Fever, unspecified: Secondary | ICD-10-CM | POA: Diagnosis not present

## 2018-02-14 DIAGNOSIS — Z992 Dependence on renal dialysis: Secondary | ICD-10-CM | POA: Diagnosis not present

## 2018-02-14 DIAGNOSIS — D631 Anemia in chronic kidney disease: Secondary | ICD-10-CM | POA: Diagnosis not present

## 2018-02-14 DIAGNOSIS — N2581 Secondary hyperparathyroidism of renal origin: Secondary | ICD-10-CM | POA: Diagnosis not present

## 2018-02-16 ENCOUNTER — Encounter: Payer: Self-pay | Admitting: Nurse Practitioner

## 2018-02-16 DIAGNOSIS — Z992 Dependence on renal dialysis: Secondary | ICD-10-CM | POA: Diagnosis not present

## 2018-02-16 DIAGNOSIS — N186 End stage renal disease: Secondary | ICD-10-CM | POA: Diagnosis not present

## 2018-02-16 DIAGNOSIS — D509 Iron deficiency anemia, unspecified: Secondary | ICD-10-CM | POA: Diagnosis not present

## 2018-02-16 DIAGNOSIS — N2581 Secondary hyperparathyroidism of renal origin: Secondary | ICD-10-CM | POA: Diagnosis not present

## 2018-02-16 DIAGNOSIS — D631 Anemia in chronic kidney disease: Secondary | ICD-10-CM | POA: Diagnosis not present

## 2018-02-16 DIAGNOSIS — R509 Fever, unspecified: Secondary | ICD-10-CM | POA: Diagnosis not present

## 2018-02-17 DIAGNOSIS — Z992 Dependence on renal dialysis: Secondary | ICD-10-CM | POA: Diagnosis not present

## 2018-02-17 DIAGNOSIS — N186 End stage renal disease: Secondary | ICD-10-CM | POA: Diagnosis not present

## 2018-02-18 DIAGNOSIS — Z992 Dependence on renal dialysis: Secondary | ICD-10-CM | POA: Diagnosis not present

## 2018-02-18 DIAGNOSIS — N2581 Secondary hyperparathyroidism of renal origin: Secondary | ICD-10-CM | POA: Diagnosis not present

## 2018-02-18 DIAGNOSIS — N186 End stage renal disease: Secondary | ICD-10-CM | POA: Diagnosis not present

## 2018-02-18 DIAGNOSIS — D509 Iron deficiency anemia, unspecified: Secondary | ICD-10-CM | POA: Diagnosis not present

## 2018-02-18 DIAGNOSIS — D631 Anemia in chronic kidney disease: Secondary | ICD-10-CM | POA: Diagnosis not present

## 2018-02-21 DIAGNOSIS — N2581 Secondary hyperparathyroidism of renal origin: Secondary | ICD-10-CM | POA: Diagnosis not present

## 2018-02-21 DIAGNOSIS — Z992 Dependence on renal dialysis: Secondary | ICD-10-CM | POA: Diagnosis not present

## 2018-02-21 DIAGNOSIS — D509 Iron deficiency anemia, unspecified: Secondary | ICD-10-CM | POA: Diagnosis not present

## 2018-02-21 DIAGNOSIS — N186 End stage renal disease: Secondary | ICD-10-CM | POA: Diagnosis not present

## 2018-02-21 DIAGNOSIS — D631 Anemia in chronic kidney disease: Secondary | ICD-10-CM | POA: Diagnosis not present

## 2018-02-22 DIAGNOSIS — T82858A Stenosis of vascular prosthetic devices, implants and grafts, initial encounter: Secondary | ICD-10-CM | POA: Diagnosis not present

## 2018-02-22 DIAGNOSIS — I871 Compression of vein: Secondary | ICD-10-CM | POA: Diagnosis not present

## 2018-02-22 DIAGNOSIS — N186 End stage renal disease: Secondary | ICD-10-CM | POA: Diagnosis not present

## 2018-02-22 DIAGNOSIS — T82898A Other specified complication of vascular prosthetic devices, implants and grafts, initial encounter: Secondary | ICD-10-CM | POA: Diagnosis not present

## 2018-02-22 DIAGNOSIS — Z992 Dependence on renal dialysis: Secondary | ICD-10-CM | POA: Diagnosis not present

## 2018-02-23 DIAGNOSIS — N2581 Secondary hyperparathyroidism of renal origin: Secondary | ICD-10-CM | POA: Diagnosis not present

## 2018-02-23 DIAGNOSIS — N186 End stage renal disease: Secondary | ICD-10-CM | POA: Diagnosis not present

## 2018-02-23 DIAGNOSIS — D509 Iron deficiency anemia, unspecified: Secondary | ICD-10-CM | POA: Diagnosis not present

## 2018-02-23 DIAGNOSIS — Z992 Dependence on renal dialysis: Secondary | ICD-10-CM | POA: Diagnosis not present

## 2018-02-23 DIAGNOSIS — D631 Anemia in chronic kidney disease: Secondary | ICD-10-CM | POA: Diagnosis not present

## 2018-02-25 ENCOUNTER — Telehealth: Payer: Self-pay | Admitting: Gastroenterology

## 2018-02-25 DIAGNOSIS — D631 Anemia in chronic kidney disease: Secondary | ICD-10-CM | POA: Diagnosis not present

## 2018-02-25 DIAGNOSIS — D509 Iron deficiency anemia, unspecified: Secondary | ICD-10-CM | POA: Diagnosis not present

## 2018-02-25 DIAGNOSIS — N2581 Secondary hyperparathyroidism of renal origin: Secondary | ICD-10-CM | POA: Diagnosis not present

## 2018-02-25 DIAGNOSIS — N186 End stage renal disease: Secondary | ICD-10-CM | POA: Diagnosis not present

## 2018-02-25 DIAGNOSIS — Z992 Dependence on renal dialysis: Secondary | ICD-10-CM | POA: Diagnosis not present

## 2018-02-25 NOTE — Telephone Encounter (Signed)
Returning message Patient needs to schedule with Dr. Marius Ditch. Patient finished her [place] and needs a follow up appt.

## 2018-02-28 DIAGNOSIS — D631 Anemia in chronic kidney disease: Secondary | ICD-10-CM | POA: Diagnosis not present

## 2018-02-28 DIAGNOSIS — N186 End stage renal disease: Secondary | ICD-10-CM | POA: Diagnosis not present

## 2018-02-28 DIAGNOSIS — D509 Iron deficiency anemia, unspecified: Secondary | ICD-10-CM | POA: Diagnosis not present

## 2018-02-28 DIAGNOSIS — Z992 Dependence on renal dialysis: Secondary | ICD-10-CM | POA: Diagnosis not present

## 2018-02-28 DIAGNOSIS — N2581 Secondary hyperparathyroidism of renal origin: Secondary | ICD-10-CM | POA: Diagnosis not present

## 2018-03-02 DIAGNOSIS — Z992 Dependence on renal dialysis: Secondary | ICD-10-CM | POA: Diagnosis not present

## 2018-03-02 DIAGNOSIS — N186 End stage renal disease: Secondary | ICD-10-CM | POA: Diagnosis not present

## 2018-03-02 DIAGNOSIS — D509 Iron deficiency anemia, unspecified: Secondary | ICD-10-CM | POA: Diagnosis not present

## 2018-03-02 DIAGNOSIS — N2581 Secondary hyperparathyroidism of renal origin: Secondary | ICD-10-CM | POA: Diagnosis not present

## 2018-03-02 DIAGNOSIS — D631 Anemia in chronic kidney disease: Secondary | ICD-10-CM | POA: Diagnosis not present

## 2018-03-04 DIAGNOSIS — N186 End stage renal disease: Secondary | ICD-10-CM | POA: Diagnosis not present

## 2018-03-04 DIAGNOSIS — Z992 Dependence on renal dialysis: Secondary | ICD-10-CM | POA: Diagnosis not present

## 2018-03-04 DIAGNOSIS — D631 Anemia in chronic kidney disease: Secondary | ICD-10-CM | POA: Diagnosis not present

## 2018-03-04 DIAGNOSIS — N2581 Secondary hyperparathyroidism of renal origin: Secondary | ICD-10-CM | POA: Diagnosis not present

## 2018-03-04 DIAGNOSIS — D509 Iron deficiency anemia, unspecified: Secondary | ICD-10-CM | POA: Diagnosis not present

## 2018-03-07 DIAGNOSIS — N186 End stage renal disease: Secondary | ICD-10-CM | POA: Diagnosis not present

## 2018-03-07 DIAGNOSIS — N2581 Secondary hyperparathyroidism of renal origin: Secondary | ICD-10-CM | POA: Diagnosis not present

## 2018-03-07 DIAGNOSIS — Z992 Dependence on renal dialysis: Secondary | ICD-10-CM | POA: Diagnosis not present

## 2018-03-07 DIAGNOSIS — D509 Iron deficiency anemia, unspecified: Secondary | ICD-10-CM | POA: Diagnosis not present

## 2018-03-07 DIAGNOSIS — D631 Anemia in chronic kidney disease: Secondary | ICD-10-CM | POA: Diagnosis not present

## 2018-03-09 DIAGNOSIS — Z992 Dependence on renal dialysis: Secondary | ICD-10-CM | POA: Diagnosis not present

## 2018-03-09 DIAGNOSIS — D631 Anemia in chronic kidney disease: Secondary | ICD-10-CM | POA: Diagnosis not present

## 2018-03-09 DIAGNOSIS — N186 End stage renal disease: Secondary | ICD-10-CM | POA: Diagnosis not present

## 2018-03-09 DIAGNOSIS — N2581 Secondary hyperparathyroidism of renal origin: Secondary | ICD-10-CM | POA: Diagnosis not present

## 2018-03-09 DIAGNOSIS — D509 Iron deficiency anemia, unspecified: Secondary | ICD-10-CM | POA: Diagnosis not present

## 2018-03-10 DIAGNOSIS — L97521 Non-pressure chronic ulcer of other part of left foot limited to breakdown of skin: Secondary | ICD-10-CM | POA: Diagnosis not present

## 2018-03-11 DIAGNOSIS — D631 Anemia in chronic kidney disease: Secondary | ICD-10-CM | POA: Diagnosis not present

## 2018-03-11 DIAGNOSIS — N2581 Secondary hyperparathyroidism of renal origin: Secondary | ICD-10-CM | POA: Diagnosis not present

## 2018-03-11 DIAGNOSIS — N186 End stage renal disease: Secondary | ICD-10-CM | POA: Diagnosis not present

## 2018-03-11 DIAGNOSIS — Z992 Dependence on renal dialysis: Secondary | ICD-10-CM | POA: Diagnosis not present

## 2018-03-11 DIAGNOSIS — D509 Iron deficiency anemia, unspecified: Secondary | ICD-10-CM | POA: Diagnosis not present

## 2018-03-14 DIAGNOSIS — D631 Anemia in chronic kidney disease: Secondary | ICD-10-CM | POA: Diagnosis not present

## 2018-03-14 DIAGNOSIS — H4312 Vitreous hemorrhage, left eye: Secondary | ICD-10-CM | POA: Diagnosis not present

## 2018-03-14 DIAGNOSIS — D509 Iron deficiency anemia, unspecified: Secondary | ICD-10-CM | POA: Diagnosis not present

## 2018-03-14 DIAGNOSIS — E113512 Type 2 diabetes mellitus with proliferative diabetic retinopathy with macular edema, left eye: Secondary | ICD-10-CM | POA: Diagnosis not present

## 2018-03-14 DIAGNOSIS — N2581 Secondary hyperparathyroidism of renal origin: Secondary | ICD-10-CM | POA: Diagnosis not present

## 2018-03-14 DIAGNOSIS — Z992 Dependence on renal dialysis: Secondary | ICD-10-CM | POA: Diagnosis not present

## 2018-03-14 DIAGNOSIS — N186 End stage renal disease: Secondary | ICD-10-CM | POA: Diagnosis not present

## 2018-03-14 LAB — HM DIABETES EYE EXAM

## 2018-03-16 DIAGNOSIS — D631 Anemia in chronic kidney disease: Secondary | ICD-10-CM | POA: Diagnosis not present

## 2018-03-16 DIAGNOSIS — N2581 Secondary hyperparathyroidism of renal origin: Secondary | ICD-10-CM | POA: Diagnosis not present

## 2018-03-16 DIAGNOSIS — Z992 Dependence on renal dialysis: Secondary | ICD-10-CM | POA: Diagnosis not present

## 2018-03-16 DIAGNOSIS — N186 End stage renal disease: Secondary | ICD-10-CM | POA: Diagnosis not present

## 2018-03-16 DIAGNOSIS — D509 Iron deficiency anemia, unspecified: Secondary | ICD-10-CM | POA: Diagnosis not present

## 2018-03-18 DIAGNOSIS — D509 Iron deficiency anemia, unspecified: Secondary | ICD-10-CM | POA: Diagnosis not present

## 2018-03-18 DIAGNOSIS — N186 End stage renal disease: Secondary | ICD-10-CM | POA: Diagnosis not present

## 2018-03-18 DIAGNOSIS — Z992 Dependence on renal dialysis: Secondary | ICD-10-CM | POA: Diagnosis not present

## 2018-03-18 DIAGNOSIS — N2581 Secondary hyperparathyroidism of renal origin: Secondary | ICD-10-CM | POA: Diagnosis not present

## 2018-03-18 DIAGNOSIS — D631 Anemia in chronic kidney disease: Secondary | ICD-10-CM | POA: Diagnosis not present

## 2018-03-20 DIAGNOSIS — N186 End stage renal disease: Secondary | ICD-10-CM | POA: Diagnosis not present

## 2018-03-20 DIAGNOSIS — Z992 Dependence on renal dialysis: Secondary | ICD-10-CM | POA: Diagnosis not present

## 2018-03-21 ENCOUNTER — Other Ambulatory Visit (HOSPITAL_COMMUNITY): Payer: Medicare Other

## 2018-03-21 ENCOUNTER — Encounter (HOSPITAL_COMMUNITY): Payer: Medicare Other

## 2018-03-21 DIAGNOSIS — D631 Anemia in chronic kidney disease: Secondary | ICD-10-CM | POA: Diagnosis not present

## 2018-03-21 DIAGNOSIS — D509 Iron deficiency anemia, unspecified: Secondary | ICD-10-CM | POA: Diagnosis not present

## 2018-03-21 DIAGNOSIS — Z992 Dependence on renal dialysis: Secondary | ICD-10-CM | POA: Diagnosis not present

## 2018-03-21 DIAGNOSIS — R1110 Vomiting, unspecified: Secondary | ICD-10-CM | POA: Diagnosis not present

## 2018-03-21 DIAGNOSIS — N186 End stage renal disease: Secondary | ICD-10-CM | POA: Diagnosis not present

## 2018-03-21 DIAGNOSIS — N2581 Secondary hyperparathyroidism of renal origin: Secondary | ICD-10-CM | POA: Diagnosis not present

## 2018-03-21 DIAGNOSIS — R110 Nausea: Secondary | ICD-10-CM | POA: Diagnosis not present

## 2018-03-21 HISTORY — PX: STENT PLACEMENT VASCULAR (ARMC HX): HXRAD1737

## 2018-03-22 ENCOUNTER — Ambulatory Visit (INDEPENDENT_AMBULATORY_CARE_PROVIDER_SITE_OTHER): Payer: Medicare Other | Admitting: Gastroenterology

## 2018-03-22 ENCOUNTER — Encounter: Payer: Self-pay | Admitting: Gastroenterology

## 2018-03-22 ENCOUNTER — Encounter: Payer: Medicare Other | Admitting: Vascular Surgery

## 2018-03-22 VITALS — BP 101/70 | HR 69 | Temp 98.4°F | Ht 67.0 in | Wt 214.6 lb

## 2018-03-22 DIAGNOSIS — T829XXA Unspecified complication of cardiac and vascular prosthetic device, implant and graft, initial encounter: Secondary | ICD-10-CM | POA: Insufficient documentation

## 2018-03-22 DIAGNOSIS — K5904 Chronic idiopathic constipation: Secondary | ICD-10-CM | POA: Diagnosis not present

## 2018-03-22 DIAGNOSIS — B182 Chronic viral hepatitis C: Secondary | ICD-10-CM | POA: Diagnosis not present

## 2018-03-22 DIAGNOSIS — K7460 Unspecified cirrhosis of liver: Secondary | ICD-10-CM | POA: Diagnosis not present

## 2018-03-22 NOTE — Patient Instructions (Signed)
High-Fiber Diet Fiber, also called dietary fiber, is a type of carbohydrate found in fruits, vegetables, whole grains, and beans. A high-fiber diet can have many health benefits. Your health care provider may recommend a high-fiber diet to help:  Prevent constipation. Fiber can make your bowel movements more regular.  Lower your cholesterol.  Relieve hemorrhoids, uncomplicated diverticulosis, or irritable bowel syndrome.  Prevent overeating as part of a weight-loss plan.  Prevent heart disease, type 2 diabetes, and certain cancers.  What is my plan? The recommended daily intake of fiber includes:  38 grams for men under age 1.  4 grams for men over age 89.  35 grams for women under age 69.  82 grams for women over age 76.  You can get the recommended daily intake of dietary fiber by eating a variety of fruits, vegetables, grains, and beans. Your health care provider may also recommend a fiber supplement if it is not possible to get enough fiber through your diet. What do I need to know about a high-fiber diet?  Fiber supplements have not been widely studied for their effectiveness, so it is better to get fiber through food sources.  Always check the fiber content on thenutrition facts label of any prepackaged food. Look for foods that contain at least 5 grams of fiber per serving.  Ask your dietitian if you have questions about specific foods that are related to your condition, especially if those foods are not listed in the following section.  Increase your daily fiber consumption gradually. Increasing your intake of dietary fiber too quickly may cause bloating, cramping, or gas.  Drink plenty of water. Water helps you to digest fiber. What foods can I eat? Grains Whole-grain breads. Multigrain cereal. Oats and oatmeal. Brown rice. Barley. Bulgur wheat. [place]. Bran muffins. Popcorn. Rye wafer crackers. Vegetables Sweet potatoes. Spinach. Kale. Artichokes. Cabbage.  Broccoli. Green peas. Carrots. Squash. Fruits Berries. Pears. Apples. Oranges. Avocados. Prunes and raisins. Dried figs. Meats and Other Protein Sources [place], kidney, pinto, and soy beans. Split peas. Lentils. Nuts and seeds. Dairy Fiber-fortified yogurt. Beverages Fiber-fortified soy milk. Fiber-fortified orange juice. Other Fiber bars. The items listed above may not be a complete list of recommended foods or beverages. Contact your dietitian for more options. What foods are not recommended? Grains White bread. Pasta made with refined flour. White rice. Vegetables Fried potatoes. Canned vegetables. Well-cooked vegetables. Fruits Fruit juice. Cooked, strained fruit. Meats and Other Protein Sources Fatty cuts of meat. Fried Sales executive or fried fish. Dairy Milk. Yogurt. Cream cheese. Sour cream. Beverages Soft drinks. Other Cakes and pastries. Butter and oils. The items listed above may not be a complete list of foods and beverages to avoid. Contact your dietitian for more information. What are some tips for including high-fiber foods in my diet?  Eat a wide variety of high-fiber foods.  Make sure that half of all grains consumed each day are whole grains.  Replace breads and cereals made from refined flour or white flour with whole-grain breads and cereals.  Replace white rice with brown rice, bulgur wheat, or millet.  Start the day with a breakfast that is high in fiber, such as a cereal that contains at least 5 grams of fiber per serving.  Use beans in place of meat in soups, salads, or pasta.  Eat high-fiber snacks, such as berries, raw vegetables, nuts, or popcorn. This information is not intended to replace advice given to you by your health care provider. Make sure you discuss any  questions you have with your health care provider. Document Released: 12/07/2005 Document Revised: 05/14/2016 Document Reviewed: 05/22/2014 Elsevier Interactive Patient Education  2018  [employer].  Dialysis Diet Dialysis is a treatment that cleans your blood. It is used when the kidneys are damaged. When you need dialysis, you should watch your diet. This is because some nutrients can build up in your blood between treatments and make you sick. These nutrients are:  Potassium.  Phosphorus.  Sodium.  Your doctor or dietitian will:  Tell you how much of these you can have.  Tell you if you need to look out for other nutrients too.  Help you plan meals.  Tell you how much to drink each day.  What do I need to know about this diet?  Limit potassium. Potassium is in milk, fruits, and vegetables.  Limit phosphorus. Phosphorus is in milk, cheese, beans, nuts, and carbonated beverages.  Limit salt (sodium). Foods that have a lot sodium include processed and cured meats, ready-made frozen meals, canned vegetables, and salty snack foods.  Do not use salt substitutes.  Try not to eat whole-grain foods and foods that have a lot of fiber.  Follow your doctor's instructions about how much to drink. You may be told to: ? Write down what you drink. ? Write down foods you eat that are made mostly from water, such as gelatin and soups. ? Drink from small cups.  Ask your doctor if you should take a medicine that binds phosphorus.  Take vitamin and mineral supplements only as told by your doctor.  Eat meat, poultry, fish, and eggs. Limit nuts and beans.  Before you cook potatoes, cut them into small pieces. Then boil them in unsalted water.  Drain all fluid from cooked vegetables and canned fruits before you eat them. What foods can I eat? Grains White bread. White rice. Cooked cereal. Unsalted popcorn. Tortillas. Pasta. Vegetables Fresh or frozen broccoli, carrots, and green beans. Cabbage. Cauliflower. Celery. Cucumbers. Eggplant. Radishes. Zucchini. Fruits Apples. Fresh or frozen berries. Fresh or canned pears, peaches, and pineapple. Grapes. Plums. Meats and  Other Protein Sources Fresh or frozen beef, pork, chicken, and fish. Eggs. Low-sodium canned tuna or salmon. Dairy Cream cheese. Heavy cream. Ricotta cheese. Beverages Apple cider. Cranberry juice. Grape juice. Lemonade. Black coffee. Condiments Herbs. Spices. Jam and jelly. Honey. Sweets and Desserts Sherbet. Cakes. Cookies. Fats and Oils Olive oil, canola oil, and safflower oil. Other Non-dairy creamer. Non-dairy whipped topping. Homemade broth without salt. The items listed above may not be a complete list of recommended foods or beverages. Contact your dietitian for more options. What foods are not recommended? Grains Whole-grain bread. Whole-grain pasta. High-fiber cereal. Vegetables Potatoes. Beets. Tomatoes. Winter squash and pumpkin. Asparagus. Spinach. Parsnips. Fruits Star fruit. Bananas. Oranges. Kiwi. Nectarines. Prunes. Melon. Dried fruit. Avocado. Meats and Other Protein Sources Canned, smoked, and cured meats. Soil scientist. Sardines. Nuts and seeds. Peanut butter. Beans and legumes. Dairy Milk. Buttermilk. Yogurt. Cheese and cottage cheese. Processed cheese spreads. Beverages Orange juice. Prune juice. Carbonated soft drinks. Condiments Salt. Salt substitutes. Soy sauce. Sweets and Desserts Ice cream. Chocolate. Candied nuts. Fats and Oils Butter. Margarine. Other Ready-made frozen meals. Canned soups. The items listed above may not be a complete list of foods and beverages to avoid. Contact your dietitian for more information. This information is not intended to replace advice given to you by your health care provider. Make sure you discuss any questions you have with your health care provider.  Document Released: 06/07/2012 Document Revised: 05/14/2016 Document Reviewed: 07/10/2014 Elsevier Interactive Patient Education  [employer].

## 2018-03-22 NOTE — Progress Notes (Signed)
Cephas Darby, MD [address]  [place]  [place], [place] [address]  Main: [phone]  Fax: [phone]    Gastroenterology Consultation  Referring Provider:     Mikey College, * Primary Care Physician:  Mikey College, NP Primary Gastroenterologist:  Dr. Cephas Darby Reason for Consultation:     Chronic hepatitis C, chronic liver disease, chronic diarrhea        HPI:   [name] is a 52 y.o. y/o female referred by Dr. Merrilyn Puma, Jerrel Ivory, NP  for consultation & management of Chronic diarrhea, chronic hepatitis C and cirrhosis  She has multiple comorbidities as listed below. She was initially seen by Dr. Vicente Males in 11/2016 in our clinic for management of chronic hepatitis C and chronic diarrhea. At the time he recommended upper endoscopy, colonoscopy and workup for her liver disease. She reported that she moved to [facility] and was lost to follow-up. Prior to this she was also seen in [facility] clinic in 04/2016 for chronic diarrhea, hepatitis C and dysphagia.  She was lost to follow-up at that time as well. Currently, she lives in [place], [place].   She has been having chronic diarrhea for more than a year intermittently. Most recent episode started about 10 days ago, after she received IV iron and Epogen at the time of dialysis. She reports having about 4-5 episodes of runny, nonbloody bowel movements. She describes that her stool was black when the diarrhea episode started, currently her stools are light yellow in color. She reports upper abdominal pain and associated with significant postprandial bloating. She denies urgency or incontinence, fever or chills, nausea or vomiting. She denies any weight loss, concern about weight gain especially in her waist. She denies swelling of legs. He denies hematemesis or coffee-ground emesis. She does have chronic normocytic anemia, and receives Epogen with her dialysis.  Hepatitis C Diagnosed in 2003- last used cocaine 6  years back , has had professional tatoos, has been incarcerated, no Armed forces logistics/support/administrative officer. Does not drink alcohol presently, no excess use in the past . No family history of liver disease. Genotype 1a,  high viral load, treatment nave, chronic liver disease manifested as splenomegaly, mild thrombocytopenia, hypoalbuminemia, fibrosis score 0.66, stage F3 in 07/2014. She is immune to hepatitis A and B. And, HIV nonreactive   Ferritin 45 in 02/2013 Her hemoglobin A1c is 8.3 in 06/2017 H Pylori stool antigen negative in 11/2016 Has been on oxycodone for chronic back pain   Follow up visit 10/01/2017 She is here for a follow-up earlier than she is scheduled with me. She did not get the labs that I ordered from last visit. She tried to go to the lab on 09/28/2017 but she had an accident in her pants secondary to fecal incontinence and had to leave right to the. She continues to have intermittent nonbloody diarrhea associated with urgency and incontinence. She took Imodium 2 pills 2 days ago. Did not have bowel movement yet. Her colon biopsies revealed focal active colitis with no background of chronicity. She cut back on smoking to 2 cigarettes a day  Follow-up visit 11/09/17: Overall, she feels good.  She denies having diarrhea anymore.  She is no longer on Imodium.  Currently, she is constipated since starting phosphate binders that contains calcium in it.  She is waiting to start hepatitis C treatment.  She got significantly cut back on smoking cigarettes.  She smoked only 4 cigarettes last week.    Follow-up visit 03/22/2018  She completed treatment for hepatitis C with Mavyret for 12 weeks. She reports that she finished treatment in first week of March. Overall, she says she hasn't been feeling well. Has been dealing with constipation associated with overflow diarrhea. She is struggling to find an appropriate diet that is suitable with her history of end-stage renal disease as well as that contains high fiber  to help with constipation. She does consume red meat, carbonated beverages, sweetened iced tea regularly. Her hemoglobin A1c has significantly improved. She does not have symptoms related to her cirrhosis.   GI Procedures:  EGD 09/22/2017 - Normal duodenal bulb and second portion of the duodenum. Biopsied. - Non-bleeding erosive gastropathy. Biopsied. - 1 cm hiatal hernia. - Z-line irregular. Biopsied. - Normal esophagus. Colonoscopy 09/22/2017    Past Medical History:  Diagnosis Date  . Allergy   . Anemia   . Anxiety    worse when not at home - bowel incontinence  . Arthritis   . Ataxia   . CHF (congestive heart failure) (HCC)    history of, has been cleared.  . Chronic kidney disease   . COPD (chronic obstructive pulmonary disease) ([place])   . Depression   . Diabetes mellitus without complication ([place])   . Emphysema of lung ([place])   . End stage renal disease ([place])    MWF dialysis  . Fistula    left upper arm  . Headache    migraines  . Hepatitis 2003   Hep C  . Hiatal hernia   . Hypercholesterolemia    pt denies  . Hypertension   . Kidney disease   . Neuropathy, diabetic (HCC)    lower legs  . Peripheral vascular disease ([place])   . Pneumonia 2015  . Psoriasis   . Tobacco dependence   . Wears dentures    full upper, partial lower    Past Surgical History:  Procedure Laterality Date  . A/V SHUNT INTERVENTION N/A 12/16/2017   Procedure: A/V SHUNT INTERVENTION;  Surgeon: Algernon Huxley, MD;  Location: [facility];  Service: Cardiovascular;  Laterality: N/A;  . AV FISTULA PLACEMENT Left 11/2014  . CESAREAN SECTION    . CHOLECYSTECTOMY    . COLONOSCOPY N/A 09/22/2017   Procedure: COLONOSCOPY;  Surgeon: Lin Landsman, MD;  Location: [facility];  Service: Endoscopy;  Laterality: N/A;  . cyst removed  from left hand Left 1989  . DIALYSIS/PERMA CATHETER INSERTION N/A 05/20/2017   Procedure: Dialysis/Perma Catheter Insertion and fistulagram/LUE  angiogram;  Surgeon: Algernon Huxley, MD;  Location: [facility];  Service: Cardiovascular;  Laterality: N/A;  . ESOPHAGOGASTRODUODENOSCOPY N/A 09/22/2017   Procedure: ESOPHAGOGASTRODUODENOSCOPY (EGD);  Surgeon: Lin Landsman, MD;  Location: [facility];  Service: Endoscopy;  Laterality: N/A;  . INCISION AND DRAINAGE ABSCESS N/A 07/16/2016   Procedure: INCISION AND DRAINAGE ABSCESS;  Surgeon: Carloyn Manner, MD;  Location: [facility];  Service: ENT;  Laterality: N/A;  . PERIPHERAL VASCULAR CATHETERIZATION N/A 07/25/2015   Procedure: A/V Shuntogram/Fistulagram;  Surgeon: Algernon Huxley, MD;  Location: [facility];  Service: Cardiovascular;  Laterality: N/A;  . PERIPHERAL VASCULAR CATHETERIZATION Left 07/25/2015   Procedure: A/V Shunt Intervention;  Surgeon: Algernon Huxley, MD;  Location: [facility];  Service: Cardiovascular;  Laterality: Left;  . PERIPHERAL VASCULAR CATHETERIZATION Left 10/07/2015   Procedure: A/V Shuntogram/Fistulagram;  Surgeon: Algernon Huxley, MD;  Location: [facility];  Service: Cardiovascular;  Laterality: Left;  . PERIPHERAL  VASCULAR CATHETERIZATION N/A 10/07/2015   Procedure: A/V Shunt Intervention;  Surgeon: Algernon Huxley, MD;  Location: [facility];  Service: Cardiovascular;  Laterality: N/A;  . PERIPHERAL VASCULAR CATHETERIZATION  10/07/2015   Procedure: Dialysis/Perma Catheter Insertion;  Surgeon: Algernon Huxley, MD;  Location: [facility];  Service: Cardiovascular;;  . PERIPHERAL VASCULAR CATHETERIZATION N/A 12/17/2015   Procedure: Dialysis/Perma Catheter Removal;  Surgeon: Katha Cabal, MD;  Location: [facility];  Service: Cardiovascular;  Laterality: N/A;  . PERIPHERAL VASCULAR CATHETERIZATION N/A 01/11/2017   Procedure: Visceral Angiography;  Surgeon: Algernon Huxley, MD;  Location: [facility];  Service: Cardiovascular;  Laterality: N/A;  . PERIPHERAL VASCULAR CATHETERIZATION N/A 01/11/2017    Procedure: Visceral Artery Intervention;  Surgeon: Algernon Huxley, MD;  Location: [facility];  Service: Cardiovascular;  Laterality: N/A;  . POLYPECTOMY  09/22/2017   Procedure: POLYPECTOMY;  Surgeon: Lin Landsman, MD;  Location: [facility];  Service: Endoscopy;;  . rt. tubal and ovary removed    . TONSILLECTOMY    . TUBAL LIGATION       Current Outpatient Medications:  .  calcium acetate (PHOSLO) 667 MG capsule, Take 1,334 mg by mouth 3 (three) times daily with meals. , Disp: , Rfl:  .  clobetasol ointment (TEMOVATE) 3.66 %, Apply 1 application topically daily as needed (for psorasis). , Disp: , Rfl:  .  cloNIDine (CATAPRES) 0.1 MG tablet, TK 1 T PO BID, Disp: , Rfl: 11 .  diltiazem (TIAZAC) 120 MG 24 hr capsule, Take 240 mg by mouth 2 (two) times daily. , Disp: , Rfl:  .  glucose blood (TRUE METRIX BLOOD GLUCOSE TEST) test strip, Use as instructed, Disp: 100 each, Rfl: 12 .  insulin aspart (NOVOLOG) 100 UNIT/ML injection, Inject 1-2 Units into the skin 3 (three) times daily before meals., Disp: 10 mL, Rfl: 11 .  insulin glargine (LANTUS) 100 UNIT/ML injection, Inject 15 Units into the skin daily. , Disp: , Rfl:  .  Insulin Syringe 27G X 1/2" 1 ML MISC, 1 Syringe by Does not apply route 4 (four) times daily., Disp: 120 each, Rfl: 4 .  ipratropium (ATROVENT) 0.06 % nasal spray, Place 2 sprays into both nostrils 4 (four) times daily., Disp: 15 mL, Rfl: 0 .  loperamide (IMODIUM A-D) 2 MG tablet, Take 2 mg by mouth 3 (three) times daily as needed. , Disp: , Rfl:  .  NOVOLIN R 100 UNIT/ML injection, INJ 2 TO 4 UNITS Iron Post TID WC, Disp: , Rfl: 1 .  ondansetron (ZOFRAN) 8 MG tablet, Take by mouth., Disp: , Rfl:  .  sevelamer carbonate (RENVELA) 800 MG tablet, Take 1,600 mg by mouth 3 (three) times daily with meals., Disp: , Rfl:    Family History  Problem Relation Age of Onset  . Hypertension Mother   . Heart disease Mother   . Hypertension Father   . Skin cancer Father       Social History   Tobacco Use  . Smoking status: Light Tobacco Smoker    Packs/day: 0.25    Years: 20.00    Pack years: 5.00    Types: Cigarettes    Last attempt to quit: 05/21/2017    Years since quitting: 0.8  . Smokeless tobacco: Never Used  Substance Use Topics  . Alcohol use: No  . Drug use: No    Allergies as of 03/22/2018 - Review Complete 03/22/2018  Allergen Reaction Noted  .  Cinnamon Anaphylaxis 10/11/2015  . Garlic Anaphylaxis and Hives 11/28/2014  . Onion Hives and Swelling 11/28/2014  . Tylenol [acetaminophen] Anaphylaxis 07/25/2015  . Ciprofloxacin Diarrhea 11/05/2016  . Prednisone Other (See Comments) 01/05/2017    Review of Systems:    All systems reviewed and negative except where noted in HPI.   Physical Exam:  BP 101/70   Pulse 69   Temp 98.4 F (36.9 C) (Oral)   Ht 5' 7" (1.702 m)   Wt 214 lb 9.6 oz (97.3 kg)   BMI 33.61 kg/m  No LMP recorded. Patient is postmenopausal.  General:   Alert,  Well-developed, well-nourished, pleasant and cooperative in NAD Head:  Normocephalic and atraumatic. Eyes:  Sclera clear, no icterus.   Conjunctiva pink. Ears:  Normal auditory acuity. Nose:  No deformity, discharge, or lesions. Mouth:  No deformity or lesions,oropharynx pink & moist. Neck:  Supple; no masses or thyromegaly. Lungs:  Respirations even and unlabored.  Clear throughout to auscultation.   No wheezes, crackles, or rhonchi. No acute distress. Heart:  Regular rate and rhythm; no murmurs, clicks, rubs, or gallops. Abdomen:  Normal bowel sounds.  Abdominal obesity, significantly redundant skin, abdominal striae present, no appreciable ascites on exam.  Soft, non-tender and non-distended without masses, hepatosplenomegaly or hernias noted.  No guarding or rebound tenderness.   Rectal: Nor performed Msk:  Symmetrical without gross deformities. Good, equal movement & strength bilaterally. Pulses:  Normal pulses noted. Extremities:  No clubbing or edema.   No cyanosis, left upper arm AV fistula, bruit palpable. Neurologic:  Alert and oriented x3;  grossly normal neurologically. Skin:  Psoriatic rash on her right elbow, fingers, No jaundice. Lymph Nodes:  No significant cervical adenopathy. Psych:  Alert and cooperative. Normal mood and affect.  Imaging Studies:  Reviewed  Assessment and Plan:   TRACINA BEAUMONT is a 52 y.o. White  female with  ESRD on hemodialysis, diabetes on insulin, chronic hepatitis C, genotype 1a, s/p treatment with Mavyret x12weeks finished in March 2018, cirrhosis, well compensated. She has alternating episodes of diarrhea and constipation. Colonic mucosa appeared normal but random colon biopsies revealed mild focal active colitis on colonoscopy in 09/2017. Differentials include early IBD or infectious etiology or nonspecific or secondary to bowel prep - Her ESR was significantly elevated - Fecal lactoferrin normal, she did not get the rest of the stool studies that I ordered as she is constipated  Constipation: - Advised her to avoid constipation, add high-fiber in her diet, fiber supplement such as psyllium, and stool softeners like Colace or MiraLAX to have regular movements 1-2 daily in the setting of cirrhosis  Advanced fibrosis/early cirrhosis: Secondary to chronic hepatitis C Fibrosure from 2015 suggests F3, she does have mild splenomegaly, thrombocytopenia, hypoalbuminemia, hyperbilirubinemia CPT score 6, class A Completed secondary liver disease workup and negative for ANA, Antimitochondrial antibodies, anti-smooth muscle antibodies. Ferritin normal. She is immune to hepatitis A and B. Alpha-1 antitrypsin levels are normal - EGD for variceal screening, up to date 09/22/2017, no evidence of varices. Repeat EGD in 2020 - No evidence of volume overload, recommend 2 g sodium diet - No evidence of encephalopathy - She does have mild normocytic anemia and thrombocytopenia. No coagulopathy. Ferritin normal - HCC  screening - alpha-fetoprotein normal, ultrasound liver 09/28/2017, no liver lesions. Repeat ultrasound and AFP in 04/2018  Chronic hepatitis C, genotype 1a, viral load 255,801 units in 08/11/2016: status post Mavyret for 12 weeks, negative cryoglobulin levels - She is immune to hepatitis A and  B. Her hep B surface antigen nonreactive, hep B surface antibody reactive, hep B core antibody nonreactive, hepatitis A IgG reactive in 08/11/2016. Hepatitis E antigen and antibody negative - Recheck HCV viral load in 12 weeks after cessation of treatment - recheck CBC, LFTs, PT/INR  Large hemorrhoids -currently no symptoms Hemorrhoid banding in future if needed  Health maintenance: Immune to hepatitis A and B vaccine Received Pneumovax on 09/2012 Recommend annual influenza vaccine Recommend zoster vaccine Colon cancer screening: Colonoscopy on 09/22/2017 revealed tubular adenomas, size greater than 10 mm. Repeat colonoscopy in 09/2020 Check Vit D levels in next visit   Follow up in 66month  RCephas Darby MD

## 2018-03-23 DIAGNOSIS — D631 Anemia in chronic kidney disease: Secondary | ICD-10-CM | POA: Diagnosis not present

## 2018-03-23 DIAGNOSIS — D509 Iron deficiency anemia, unspecified: Secondary | ICD-10-CM | POA: Diagnosis not present

## 2018-03-23 DIAGNOSIS — N186 End stage renal disease: Secondary | ICD-10-CM | POA: Diagnosis not present

## 2018-03-23 DIAGNOSIS — N2581 Secondary hyperparathyroidism of renal origin: Secondary | ICD-10-CM | POA: Diagnosis not present

## 2018-03-23 DIAGNOSIS — Z992 Dependence on renal dialysis: Secondary | ICD-10-CM | POA: Diagnosis not present

## 2018-03-23 DIAGNOSIS — R110 Nausea: Secondary | ICD-10-CM | POA: Diagnosis not present

## 2018-03-25 DIAGNOSIS — R110 Nausea: Secondary | ICD-10-CM | POA: Diagnosis not present

## 2018-03-25 DIAGNOSIS — N2581 Secondary hyperparathyroidism of renal origin: Secondary | ICD-10-CM | POA: Diagnosis not present

## 2018-03-25 DIAGNOSIS — N186 End stage renal disease: Secondary | ICD-10-CM | POA: Diagnosis not present

## 2018-03-25 DIAGNOSIS — D631 Anemia in chronic kidney disease: Secondary | ICD-10-CM | POA: Diagnosis not present

## 2018-03-25 DIAGNOSIS — Z992 Dependence on renal dialysis: Secondary | ICD-10-CM | POA: Diagnosis not present

## 2018-03-25 DIAGNOSIS — D509 Iron deficiency anemia, unspecified: Secondary | ICD-10-CM | POA: Diagnosis not present

## 2018-03-28 DIAGNOSIS — N2581 Secondary hyperparathyroidism of renal origin: Secondary | ICD-10-CM | POA: Diagnosis not present

## 2018-03-28 DIAGNOSIS — D631 Anemia in chronic kidney disease: Secondary | ICD-10-CM | POA: Diagnosis not present

## 2018-03-28 DIAGNOSIS — D509 Iron deficiency anemia, unspecified: Secondary | ICD-10-CM | POA: Diagnosis not present

## 2018-03-28 DIAGNOSIS — Z992 Dependence on renal dialysis: Secondary | ICD-10-CM | POA: Diagnosis not present

## 2018-03-28 DIAGNOSIS — N186 End stage renal disease: Secondary | ICD-10-CM | POA: Diagnosis not present

## 2018-03-28 DIAGNOSIS — R110 Nausea: Secondary | ICD-10-CM | POA: Diagnosis not present

## 2018-03-29 ENCOUNTER — Other Ambulatory Visit: Payer: Self-pay

## 2018-03-29 ENCOUNTER — Encounter: Payer: Self-pay | Admitting: Nurse Practitioner

## 2018-03-29 ENCOUNTER — Ambulatory Visit (INDEPENDENT_AMBULATORY_CARE_PROVIDER_SITE_OTHER): Payer: Medicare Other | Admitting: Nurse Practitioner

## 2018-03-29 VITALS — BP 121/61 | HR 71 | Temp 99.6°F | Resp 20 | Ht 67.0 in | Wt 216.8 lb

## 2018-03-29 DIAGNOSIS — Z992 Dependence on renal dialysis: Secondary | ICD-10-CM

## 2018-03-29 DIAGNOSIS — N186 End stage renal disease: Secondary | ICD-10-CM | POA: Diagnosis not present

## 2018-03-29 DIAGNOSIS — E118 Type 2 diabetes mellitus with unspecified complications: Secondary | ICD-10-CM | POA: Diagnosis not present

## 2018-03-29 DIAGNOSIS — E1165 Type 2 diabetes mellitus with hyperglycemia: Secondary | ICD-10-CM

## 2018-03-29 DIAGNOSIS — J301 Allergic rhinitis due to pollen: Secondary | ICD-10-CM

## 2018-03-29 DIAGNOSIS — IMO0002 Reserved for concepts with insufficient information to code with codable children: Secondary | ICD-10-CM

## 2018-03-29 LAB — POCT GLYCOSYLATED HEMOGLOBIN (HGB A1C): Hemoglobin A1C: 7

## 2018-03-29 MED ORDER — FEXOFENADINE HCL 180 MG PO TABS: 180.0000 mg | ORAL_TABLET | Freq: Every day | ORAL | 2 refills | Status: DC

## 2018-03-29 NOTE — Patient Instructions (Addendum)
[name],   Thank you for coming in to clinic today.  1. Allergies: - TRY to use ([place]) fexofenadine generic daily for allergies instead of levocetirizine. - Use saline nasal spray if you need more moisture.  --> Ask about Medicaid taxi for your [place] appointments.  2. Work toward eating a good diet.  We are sending a referral to possible help for your food resources.  Expect a phone call from [facility]   Please schedule a follow-up appointment with Cassell Smiles, AGNP. Return in about 3 months (around 06/28/2018) for diabetes.  If you have any other questions or concerns, please feel free to call the clinic or send a message through [facility]. You may also schedule an earlier appointment if necessary.  You will receive a survey after today's visit either digitally by e-mail or paper by [employer]. Your experiences and feedback matter to [place].  Please respond so we know how we are doing as we provide care for you.   Cassell Smiles, DNP, AGNP-BC Adult Gerontology Nurse Practitioner [facility]

## 2018-03-29 NOTE — Assessment & Plan Note (Signed)
Stable and well controlled with A1c at goal after result today = 7.0.  Goal A1c < 8.0%  -Has had no hypoglycemic events since last visit  Plan: 1.  Continue mealtime insulin dose with Novolog 1-2 units each meal depending on size of meal and carb servings.  1 units small meal/1-2 carb servings and 2 units large meal/3 or more carb servings. - Hold Novolog if preprandial glucose is less than 120. 2. Continue lantus 15 units once daily. 3. Reviewed need to check CBG prior to meals.  Keep log and bring to clinic. 4.  Continues to be complicated with gastroparesis.  Worse gastroparesis symptoms with food insecurity. 5.  Follow-up 3 months and recheck A1c.

## 2018-03-29 NOTE — Assessment & Plan Note (Signed)
Patient who has end-stage renal disease and on dialysis presents today for follow-up of diabetes.  She has had complications of AV fistula.  Most recently, has had intervention with stent placement to prolonged use of AV fistula with prior complications.  Plan: 1.  Continue vascular follow-up. 2.  Continue dialysis as ordered. 3.  Continue phosphate binders and adherence to renal diet as able. 4.  Follow-up as needed and months per year.

## 2018-03-29 NOTE — Progress Notes (Signed)
Subjective:    Patient ID: [name], female    DOB: 10-Apr-1966, 52 y.o.   MRN: [id number]  [name] is a 52 y.o. female presenting on 03/29/2018 for Diabetes and Allergies (headache, bilateral earache, runny nose, stuffy nose, sneezing and coughing )   HPI Diabetes Pt presents today for follow up of Type 2 diabetes mellitus. She is checking CBG at home, but did not bring her log today.  - Current diabetic medications include: lantus 15 units once daily, novolon 1-2 units tid wc - She is symptomatic with gastroparesis symptoms with nausea.  Complicated by diarrhea with medications targeting gastroparesis.  - She denies polydipsia, polyphagia, polyuria, headaches, diaphoresis, shakiness, chills, pain, numbness or tingling in extremities and changes in vision.   - Clinical course has been stable. - She  reports no regular exercise routine. - Her diet is moderate in salt, moderate in fat, and high in carbohydrates.  Patient admits recent food instability with change in food stamp delivery dates after government shutdown. - Weight trend: increasing steadily  PREVENTION: Eye exam current (within one year): yes Foot exam current (within one year): yes  Lipid/ASCVD risk reduction - on statin: no Kidney protection - on ace or arb: contraindicated with ESRD Recent Labs    05/19/17 1554 07/12/17 12/27/17 1035 03/29/18 1042  HGBA1C 8.0* 8.3 6.7* 7.0     Allergies Patient presents today with significantly increased nasal congestion, sinus pressure, itchy and watery eyes.  Patient was taking ipratropium for nasal congestion and got very dry requiring frequent sips of water and weight gain of 3 pounds for the next dialysis.  She is also not tolerating levocetirizine well.   Social History   Tobacco Use  . Smoking status: Light Tobacco Smoker    Packs/day: 0.25    Years: 20.00    Pack years: 5.00    Types: Cigarettes    Last attempt to quit: 05/21/2017    Years since quitting: 0.8   . Smokeless tobacco: Never Used  Substance Use Topics  . Alcohol use: No  . Drug use: No    Review of Systems Per HPI unless specifically indicated above     Objective:    BP 121/61 (BP Location: Right Arm, Patient Position: Sitting, Cuff Size: Large)   Pulse 71   Temp 99.6 F (37.6 C) (Oral)   Resp 20   Ht 5\' 7"  (1.702 m)   Wt 216 lb 12.8 oz (98.3 kg)   SpO2 100%   BMI 33.96 kg/m   Wt Readings from Last 3 Encounters:  03/29/18 216 lb 12.8 oz (98.3 kg)  03/22/18 214 lb 9.6 oz (97.3 kg)  01/27/18 209 lb 9.6 oz (95.1 kg)    Physical Exam  Constitutional: She is oriented to person, place, and time. She appears well-developed and well-nourished. No distress.  HENT:  Head: Normocephalic and atraumatic.  Nose: Mucosal edema and rhinorrhea present. Right sinus exhibits maxillary sinus tenderness and frontal sinus tenderness. Left sinus exhibits maxillary sinus tenderness and frontal sinus tenderness.  Neck: Normal range of motion. Neck supple. Carotid bruit is not present.  Cardiovascular: Normal rate, regular rhythm, S1 normal, S2 normal, normal heart sounds and intact distal pulses.  Pulmonary/Chest: Effort normal and breath sounds normal. No respiratory distress.  Musculoskeletal: She exhibits no edema (pedal).  Neurological: She is alert and oriented to person, place, and time.  Skin: Skin is warm and dry.  Psychiatric: She has a normal mood and affect. Her behavior is  normal.  Vitals reviewed.  Results for orders placed or performed in visit on 03/29/18  POCT glycosylated hemoglobin (Hb A1C)  Result Value Ref Range   Hemoglobin A1C 7.0       Assessment & Plan:   Problem List Items Addressed This Visit      Respiratory   Seasonal allergic rhinitis due to pollen    Consistent with seasonal allergic rhinitis, triggers: pollen and dusts. - Today no evidence of acute sinusitis or complication.  - Has not tolerated ipratropium nasal spray, levocetirizine, or  flonase  Plan: 1. Start nasal saline spray prn. 2. START antihistamine loratadine or fexofenadine 180 mg once daily. 3. Follow-up as needed, consider 2nd opinion from ENT/allergy.      Relevant Medications   fexofenadine (ALLEGRA) 180 MG tablet     Endocrine   Diabetes mellitus type 2, uncontrolled, with complications ([place]) - Primary    Stable and well controlled with A1c at goal after result today = 7.0.  Goal A1c < 8.0%  -Has had no hypoglycemic events since last visit  Plan: 1.  Continue mealtime insulin dose with Novolog 1-2 units each meal depending on size of meal and carb servings.  1 units small meal/1-2 carb servings and 2 units large meal/3 or more carb servings. - Hold Novolog if preprandial glucose is less than 120. 2. Continue lantus 15 units once daily. 3. Reviewed need to check CBG prior to meals.  Keep log and bring to clinic. 4.  Continues to be complicated with gastroparesis.  Worse gastroparesis symptoms with food insecurity. 5.  Follow-up 3 months and recheck A1c.      Relevant Orders   POCT glycosylated hemoglobin (Hb A1C) (Completed)   Ambulatory referral to [facility]     Genitourinary   ESRD (end stage renal disease) on dialysis [facility])    Patient who has end-stage renal disease and on dialysis presents today for follow-up of diabetes.  She has had complications of AV fistula.  Most recently, has had intervention with stent placement to prolonged use of AV fistula with prior complications.  Plan: 1.  Continue vascular follow-up. 2.  Continue dialysis as ordered. 3.  Continue phosphate binders and adherence to renal diet as able. 4.  Follow-up as needed and months per year.         Meds ordered this encounter  Medications  . fexofenadine (ALLEGRA) 180 MG tablet    Sig: Take 1 tablet (180 mg total) by mouth daily.    Dispense:  30 tablet    Refill:  2    Order Specific Question:   Supervising Provider    Answer:   Olin Hauser  [[id number]]    Follow up plan: Return in about 3 months (around 06/28/2018) for diabetes.  Cassell Smiles, DNP, AGPCNP-BC Adult Gerontology Primary Care Nurse Practitioner [facility] 03/29/2018, 3:30 PM

## 2018-03-29 NOTE — Assessment & Plan Note (Signed)
Consistent with seasonal allergic rhinitis, triggers: pollen and dusts. - Today no evidence of acute sinusitis or complication.  - Has not tolerated ipratropium nasal spray, levocetirizine, or flonase  Plan: 1. Start nasal saline spray prn. 2. START antihistamine loratadine or fexofenadine 180 mg once daily. 3. Follow-up as needed, consider 2nd opinion from ENT/allergy.

## 2018-03-30 ENCOUNTER — Telehealth: Payer: Self-pay | Admitting: Nurse Practitioner

## 2018-03-30 DIAGNOSIS — D631 Anemia in chronic kidney disease: Secondary | ICD-10-CM | POA: Diagnosis not present

## 2018-03-30 DIAGNOSIS — N2581 Secondary hyperparathyroidism of renal origin: Secondary | ICD-10-CM | POA: Diagnosis not present

## 2018-03-30 DIAGNOSIS — R110 Nausea: Secondary | ICD-10-CM | POA: Diagnosis not present

## 2018-03-30 DIAGNOSIS — Z992 Dependence on renal dialysis: Secondary | ICD-10-CM | POA: Diagnosis not present

## 2018-03-30 DIAGNOSIS — N186 End stage renal disease: Secondary | ICD-10-CM | POA: Diagnosis not present

## 2018-03-30 DIAGNOSIS — D509 Iron deficiency anemia, unspecified: Secondary | ICD-10-CM | POA: Diagnosis not present

## 2018-03-30 NOTE — Telephone Encounter (Signed)
Emotional support animal letter

## 2018-04-01 DIAGNOSIS — N186 End stage renal disease: Secondary | ICD-10-CM | POA: Diagnosis not present

## 2018-04-01 DIAGNOSIS — D631 Anemia in chronic kidney disease: Secondary | ICD-10-CM | POA: Diagnosis not present

## 2018-04-01 DIAGNOSIS — Z992 Dependence on renal dialysis: Secondary | ICD-10-CM | POA: Diagnosis not present

## 2018-04-01 DIAGNOSIS — D509 Iron deficiency anemia, unspecified: Secondary | ICD-10-CM | POA: Diagnosis not present

## 2018-04-01 DIAGNOSIS — N2581 Secondary hyperparathyroidism of renal origin: Secondary | ICD-10-CM | POA: Diagnosis not present

## 2018-04-01 DIAGNOSIS — R110 Nausea: Secondary | ICD-10-CM | POA: Diagnosis not present

## 2018-04-04 DIAGNOSIS — D509 Iron deficiency anemia, unspecified: Secondary | ICD-10-CM | POA: Diagnosis not present

## 2018-04-04 DIAGNOSIS — N186 End stage renal disease: Secondary | ICD-10-CM | POA: Diagnosis not present

## 2018-04-04 DIAGNOSIS — R110 Nausea: Secondary | ICD-10-CM | POA: Diagnosis not present

## 2018-04-04 DIAGNOSIS — D631 Anemia in chronic kidney disease: Secondary | ICD-10-CM | POA: Diagnosis not present

## 2018-04-04 DIAGNOSIS — Z992 Dependence on renal dialysis: Secondary | ICD-10-CM | POA: Diagnosis not present

## 2018-04-04 DIAGNOSIS — N2581 Secondary hyperparathyroidism of renal origin: Secondary | ICD-10-CM | POA: Diagnosis not present

## 2018-04-05 DIAGNOSIS — Z794 Long term (current) use of insulin: Secondary | ICD-10-CM | POA: Diagnosis not present

## 2018-04-05 DIAGNOSIS — B1920 Unspecified viral hepatitis C without hepatic coma: Secondary | ICD-10-CM | POA: Diagnosis not present

## 2018-04-05 DIAGNOSIS — I10 Essential (primary) hypertension: Secondary | ICD-10-CM | POA: Diagnosis not present

## 2018-04-05 DIAGNOSIS — K219 Gastro-esophageal reflux disease without esophagitis: Secondary | ICD-10-CM | POA: Diagnosis not present

## 2018-04-05 DIAGNOSIS — E1122 Type 2 diabetes mellitus with diabetic chronic kidney disease: Secondary | ICD-10-CM | POA: Diagnosis not present

## 2018-04-05 DIAGNOSIS — Z992 Dependence on renal dialysis: Secondary | ICD-10-CM | POA: Diagnosis not present

## 2018-04-05 DIAGNOSIS — N186 End stage renal disease: Secondary | ICD-10-CM | POA: Diagnosis not present

## 2018-04-06 DIAGNOSIS — Z992 Dependence on renal dialysis: Secondary | ICD-10-CM | POA: Diagnosis not present

## 2018-04-06 DIAGNOSIS — N2581 Secondary hyperparathyroidism of renal origin: Secondary | ICD-10-CM | POA: Diagnosis not present

## 2018-04-06 DIAGNOSIS — N186 End stage renal disease: Secondary | ICD-10-CM | POA: Diagnosis not present

## 2018-04-06 DIAGNOSIS — R110 Nausea: Secondary | ICD-10-CM | POA: Diagnosis not present

## 2018-04-06 DIAGNOSIS — D631 Anemia in chronic kidney disease: Secondary | ICD-10-CM | POA: Diagnosis not present

## 2018-04-06 DIAGNOSIS — D509 Iron deficiency anemia, unspecified: Secondary | ICD-10-CM | POA: Diagnosis not present

## 2018-04-07 ENCOUNTER — Other Ambulatory Visit
Admission: RE | Admit: 2018-04-07 | Discharge: 2018-04-07 | Disposition: A | Discharge status: Home / Self Care | Payer: Medicare Other | Source: Ambulatory Visit | Attending: Gastroenterology | Admitting: Gastroenterology

## 2018-04-07 DIAGNOSIS — L98499 Non-pressure chronic ulcer of skin of other sites with unspecified severity: Secondary | ICD-10-CM | POA: Diagnosis not present

## 2018-04-07 DIAGNOSIS — D229 Melanocytic nevi, unspecified: Secondary | ICD-10-CM | POA: Diagnosis not present

## 2018-04-07 DIAGNOSIS — L570 Actinic keratosis: Secondary | ICD-10-CM | POA: Diagnosis not present

## 2018-04-07 DIAGNOSIS — B182 Chronic viral hepatitis C: Secondary | ICD-10-CM | POA: Diagnosis not present

## 2018-04-07 DIAGNOSIS — L400 Psoriasis vulgaris: Secondary | ICD-10-CM | POA: Diagnosis not present

## 2018-04-07 DIAGNOSIS — D485 Neoplasm of uncertain behavior of skin: Secondary | ICD-10-CM | POA: Diagnosis not present

## 2018-04-07 DIAGNOSIS — L918 Other hypertrophic disorders of the skin: Secondary | ICD-10-CM | POA: Diagnosis not present

## 2018-04-07 DIAGNOSIS — L304 Erythema intertrigo: Secondary | ICD-10-CM | POA: Diagnosis not present

## 2018-04-07 DIAGNOSIS — B359 Dermatophytosis, unspecified: Secondary | ICD-10-CM | POA: Diagnosis not present

## 2018-04-07 DIAGNOSIS — D225 Melanocytic nevi of trunk: Secondary | ICD-10-CM | POA: Diagnosis not present

## 2018-04-07 LAB — CBC
HCT: 33.6 % — ABNORMAL LOW (ref 35.0–47.0)
Hemoglobin: 11.7 g/dL — ABNORMAL LOW (ref 12.0–16.0)
MCH: 33.8 pg (ref 26.0–34.0)
MCHC: 34.8 g/dL (ref 32.0–36.0)
MCV: 97.2 fL (ref 80.0–100.0)
Platelets: 193 10*3/uL (ref 150–440)
RBC: 3.46 MIL/uL — ABNORMAL LOW (ref 3.80–5.20)
RDW: 13.9 % (ref 11.5–14.5)
WBC: 6.9 10*3/uL (ref 3.6–11.0)

## 2018-04-07 LAB — HEPATIC FUNCTION PANEL
ALT: 13 U/L — ABNORMAL LOW (ref 14–54)
AST: 23 U/L (ref 15–41)
Albumin: 3.8 g/dL (ref 3.5–5.0)
Alkaline Phosphatase: 64 U/L (ref 38–126)
TOTAL PROTEIN: 8 g/dL (ref 6.5–8.1)
Total Bilirubin: 0.5 mg/dL (ref 0.3–1.2)

## 2018-04-07 LAB — PROTIME-INR
INR: 1.05
PROTHROMBIN TIME: 13.6 s (ref 11.4–15.2)

## 2018-04-08 DIAGNOSIS — R110 Nausea: Secondary | ICD-10-CM | POA: Diagnosis not present

## 2018-04-08 DIAGNOSIS — D509 Iron deficiency anemia, unspecified: Secondary | ICD-10-CM | POA: Diagnosis not present

## 2018-04-08 DIAGNOSIS — N2581 Secondary hyperparathyroidism of renal origin: Secondary | ICD-10-CM | POA: Diagnosis not present

## 2018-04-08 DIAGNOSIS — D631 Anemia in chronic kidney disease: Secondary | ICD-10-CM | POA: Diagnosis not present

## 2018-04-08 DIAGNOSIS — Z992 Dependence on renal dialysis: Secondary | ICD-10-CM | POA: Diagnosis not present

## 2018-04-08 DIAGNOSIS — N186 End stage renal disease: Secondary | ICD-10-CM | POA: Diagnosis not present

## 2018-04-08 LAB — AFP TUMOR MARKER: AFP, SERUM, TUMOR MARKER: 1.5 ng/mL (ref 0.0–8.3)

## 2018-04-08 LAB — HCV RNA QUANT: HCV Quantitative: NOT DETECTED IU/mL (ref >50)

## 2018-04-11 DIAGNOSIS — D631 Anemia in chronic kidney disease: Secondary | ICD-10-CM | POA: Diagnosis not present

## 2018-04-11 DIAGNOSIS — N2581 Secondary hyperparathyroidism of renal origin: Secondary | ICD-10-CM | POA: Diagnosis not present

## 2018-04-11 DIAGNOSIS — E119 Type 2 diabetes mellitus without complications: Secondary | ICD-10-CM | POA: Diagnosis not present

## 2018-04-11 DIAGNOSIS — D509 Iron deficiency anemia, unspecified: Secondary | ICD-10-CM | POA: Diagnosis not present

## 2018-04-11 DIAGNOSIS — Z794 Long term (current) use of insulin: Secondary | ICD-10-CM | POA: Diagnosis not present

## 2018-04-11 DIAGNOSIS — N186 End stage renal disease: Secondary | ICD-10-CM | POA: Diagnosis not present

## 2018-04-11 DIAGNOSIS — Z992 Dependence on renal dialysis: Secondary | ICD-10-CM | POA: Diagnosis not present

## 2018-04-11 DIAGNOSIS — R110 Nausea: Secondary | ICD-10-CM | POA: Diagnosis not present

## 2018-04-13 DIAGNOSIS — N186 End stage renal disease: Secondary | ICD-10-CM | POA: Diagnosis not present

## 2018-04-13 DIAGNOSIS — D509 Iron deficiency anemia, unspecified: Secondary | ICD-10-CM | POA: Diagnosis not present

## 2018-04-13 DIAGNOSIS — R110 Nausea: Secondary | ICD-10-CM | POA: Diagnosis not present

## 2018-04-13 DIAGNOSIS — Z992 Dependence on renal dialysis: Secondary | ICD-10-CM | POA: Diagnosis not present

## 2018-04-13 DIAGNOSIS — D631 Anemia in chronic kidney disease: Secondary | ICD-10-CM | POA: Diagnosis not present

## 2018-04-13 DIAGNOSIS — N2581 Secondary hyperparathyroidism of renal origin: Secondary | ICD-10-CM | POA: Diagnosis not present

## 2018-04-15 DIAGNOSIS — N186 End stage renal disease: Secondary | ICD-10-CM | POA: Diagnosis not present

## 2018-04-15 DIAGNOSIS — D509 Iron deficiency anemia, unspecified: Secondary | ICD-10-CM | POA: Diagnosis not present

## 2018-04-15 DIAGNOSIS — N2581 Secondary hyperparathyroidism of renal origin: Secondary | ICD-10-CM | POA: Diagnosis not present

## 2018-04-15 DIAGNOSIS — R110 Nausea: Secondary | ICD-10-CM | POA: Diagnosis not present

## 2018-04-15 DIAGNOSIS — Z992 Dependence on renal dialysis: Secondary | ICD-10-CM | POA: Diagnosis not present

## 2018-04-15 DIAGNOSIS — D631 Anemia in chronic kidney disease: Secondary | ICD-10-CM | POA: Diagnosis not present

## 2018-04-16 ENCOUNTER — Other Ambulatory Visit: Payer: Self-pay | Admitting: Nurse Practitioner

## 2018-04-16 DIAGNOSIS — J00 Acute nasopharyngitis [common cold]: Secondary | ICD-10-CM

## 2018-04-18 DIAGNOSIS — N186 End stage renal disease: Secondary | ICD-10-CM | POA: Diagnosis not present

## 2018-04-18 DIAGNOSIS — D631 Anemia in chronic kidney disease: Secondary | ICD-10-CM | POA: Diagnosis not present

## 2018-04-18 DIAGNOSIS — Z992 Dependence on renal dialysis: Secondary | ICD-10-CM | POA: Diagnosis not present

## 2018-04-18 DIAGNOSIS — N2581 Secondary hyperparathyroidism of renal origin: Secondary | ICD-10-CM | POA: Diagnosis not present

## 2018-04-18 DIAGNOSIS — D509 Iron deficiency anemia, unspecified: Secondary | ICD-10-CM | POA: Diagnosis not present

## 2018-04-18 DIAGNOSIS — R110 Nausea: Secondary | ICD-10-CM | POA: Diagnosis not present

## 2018-04-19 ENCOUNTER — Ambulatory Visit: Payer: Medicare Other | Admitting: Cardiovascular Disease

## 2018-04-19 DIAGNOSIS — Z992 Dependence on renal dialysis: Secondary | ICD-10-CM | POA: Diagnosis not present

## 2018-04-19 DIAGNOSIS — N186 End stage renal disease: Secondary | ICD-10-CM | POA: Diagnosis not present

## 2018-04-20 ENCOUNTER — Encounter

## 2018-04-20 DIAGNOSIS — D509 Iron deficiency anemia, unspecified: Secondary | ICD-10-CM | POA: Diagnosis not present

## 2018-04-20 DIAGNOSIS — N186 End stage renal disease: Secondary | ICD-10-CM | POA: Diagnosis not present

## 2018-04-20 DIAGNOSIS — Z992 Dependence on renal dialysis: Secondary | ICD-10-CM | POA: Diagnosis not present

## 2018-04-20 DIAGNOSIS — D631 Anemia in chronic kidney disease: Secondary | ICD-10-CM | POA: Diagnosis not present

## 2018-04-20 DIAGNOSIS — N2581 Secondary hyperparathyroidism of renal origin: Secondary | ICD-10-CM | POA: Diagnosis not present

## 2018-04-22 ENCOUNTER — Ambulatory Visit
Admission: RE | Admit: 2018-04-22 | Discharge: 2018-04-22 | Disposition: A | Discharge status: Home / Self Care | Payer: Medicare Other | Source: Ambulatory Visit | Attending: Gastroenterology | Admitting: Gastroenterology

## 2018-04-22 DIAGNOSIS — Z9049 Acquired absence of other specified parts of digestive tract: Secondary | ICD-10-CM | POA: Insufficient documentation

## 2018-04-22 DIAGNOSIS — D631 Anemia in chronic kidney disease: Secondary | ICD-10-CM | POA: Diagnosis not present

## 2018-04-22 DIAGNOSIS — K7460 Unspecified cirrhosis of liver: Secondary | ICD-10-CM | POA: Diagnosis not present

## 2018-04-22 DIAGNOSIS — B182 Chronic viral hepatitis C: Secondary | ICD-10-CM | POA: Insufficient documentation

## 2018-04-22 DIAGNOSIS — Z992 Dependence on renal dialysis: Secondary | ICD-10-CM | POA: Diagnosis not present

## 2018-04-22 DIAGNOSIS — D509 Iron deficiency anemia, unspecified: Secondary | ICD-10-CM | POA: Diagnosis not present

## 2018-04-22 DIAGNOSIS — N2581 Secondary hyperparathyroidism of renal origin: Secondary | ICD-10-CM | POA: Diagnosis not present

## 2018-04-22 DIAGNOSIS — N186 End stage renal disease: Secondary | ICD-10-CM | POA: Diagnosis not present

## 2018-04-25 DIAGNOSIS — D631 Anemia in chronic kidney disease: Secondary | ICD-10-CM | POA: Diagnosis not present

## 2018-04-25 DIAGNOSIS — Z992 Dependence on renal dialysis: Secondary | ICD-10-CM | POA: Diagnosis not present

## 2018-04-25 DIAGNOSIS — N186 End stage renal disease: Secondary | ICD-10-CM | POA: Diagnosis not present

## 2018-04-25 DIAGNOSIS — D509 Iron deficiency anemia, unspecified: Secondary | ICD-10-CM | POA: Diagnosis not present

## 2018-04-25 DIAGNOSIS — N2581 Secondary hyperparathyroidism of renal origin: Secondary | ICD-10-CM | POA: Diagnosis not present

## 2018-04-27 DIAGNOSIS — L97521 Non-pressure chronic ulcer of other part of left foot limited to breakdown of skin: Secondary | ICD-10-CM | POA: Diagnosis not present

## 2018-04-27 DIAGNOSIS — Z794 Long term (current) use of insulin: Secondary | ICD-10-CM | POA: Diagnosis not present

## 2018-04-27 DIAGNOSIS — E1140 Type 2 diabetes mellitus with diabetic neuropathy, unspecified: Secondary | ICD-10-CM | POA: Diagnosis not present

## 2018-04-27 DIAGNOSIS — B351 Tinea unguium: Secondary | ICD-10-CM | POA: Diagnosis not present

## 2018-04-28 DIAGNOSIS — D631 Anemia in chronic kidney disease: Secondary | ICD-10-CM | POA: Diagnosis not present

## 2018-04-28 DIAGNOSIS — L570 Actinic keratosis: Secondary | ICD-10-CM | POA: Diagnosis not present

## 2018-04-28 DIAGNOSIS — N186 End stage renal disease: Secondary | ICD-10-CM | POA: Diagnosis not present

## 2018-04-28 DIAGNOSIS — D485 Neoplasm of uncertain behavior of skin: Secondary | ICD-10-CM | POA: Diagnosis not present

## 2018-04-28 DIAGNOSIS — L304 Erythema intertrigo: Secondary | ICD-10-CM | POA: Diagnosis not present

## 2018-04-28 DIAGNOSIS — D509 Iron deficiency anemia, unspecified: Secondary | ICD-10-CM | POA: Diagnosis not present

## 2018-04-28 DIAGNOSIS — B353 Tinea pedis: Secondary | ICD-10-CM | POA: Diagnosis not present

## 2018-04-28 DIAGNOSIS — Z992 Dependence on renal dialysis: Secondary | ICD-10-CM | POA: Diagnosis not present

## 2018-04-28 DIAGNOSIS — L400 Psoriasis vulgaris: Secondary | ICD-10-CM | POA: Diagnosis not present

## 2018-04-28 DIAGNOSIS — N2581 Secondary hyperparathyroidism of renal origin: Secondary | ICD-10-CM | POA: Diagnosis not present

## 2018-04-29 DIAGNOSIS — N2581 Secondary hyperparathyroidism of renal origin: Secondary | ICD-10-CM | POA: Diagnosis not present

## 2018-04-29 DIAGNOSIS — D631 Anemia in chronic kidney disease: Secondary | ICD-10-CM | POA: Diagnosis not present

## 2018-04-29 DIAGNOSIS — N186 End stage renal disease: Secondary | ICD-10-CM | POA: Diagnosis not present

## 2018-04-29 DIAGNOSIS — D509 Iron deficiency anemia, unspecified: Secondary | ICD-10-CM | POA: Diagnosis not present

## 2018-04-29 DIAGNOSIS — Z992 Dependence on renal dialysis: Secondary | ICD-10-CM | POA: Diagnosis not present

## 2018-05-02 DIAGNOSIS — Z992 Dependence on renal dialysis: Secondary | ICD-10-CM | POA: Diagnosis not present

## 2018-05-02 DIAGNOSIS — N186 End stage renal disease: Secondary | ICD-10-CM | POA: Diagnosis not present

## 2018-05-02 DIAGNOSIS — N2581 Secondary hyperparathyroidism of renal origin: Secondary | ICD-10-CM | POA: Diagnosis not present

## 2018-05-02 DIAGNOSIS — D509 Iron deficiency anemia, unspecified: Secondary | ICD-10-CM | POA: Diagnosis not present

## 2018-05-02 DIAGNOSIS — D631 Anemia in chronic kidney disease: Secondary | ICD-10-CM | POA: Diagnosis not present

## 2018-05-04 DIAGNOSIS — D509 Iron deficiency anemia, unspecified: Secondary | ICD-10-CM | POA: Diagnosis not present

## 2018-05-04 DIAGNOSIS — Z992 Dependence on renal dialysis: Secondary | ICD-10-CM | POA: Diagnosis not present

## 2018-05-04 DIAGNOSIS — N2581 Secondary hyperparathyroidism of renal origin: Secondary | ICD-10-CM | POA: Diagnosis not present

## 2018-05-04 DIAGNOSIS — N186 End stage renal disease: Secondary | ICD-10-CM | POA: Diagnosis not present

## 2018-05-04 DIAGNOSIS — D631 Anemia in chronic kidney disease: Secondary | ICD-10-CM | POA: Diagnosis not present

## 2018-05-06 ENCOUNTER — Encounter: Payer: Self-pay | Admitting: Nurse Practitioner

## 2018-05-06 DIAGNOSIS — D631 Anemia in chronic kidney disease: Secondary | ICD-10-CM | POA: Diagnosis not present

## 2018-05-06 DIAGNOSIS — D509 Iron deficiency anemia, unspecified: Secondary | ICD-10-CM | POA: Diagnosis not present

## 2018-05-06 DIAGNOSIS — N2581 Secondary hyperparathyroidism of renal origin: Secondary | ICD-10-CM | POA: Diagnosis not present

## 2018-05-06 DIAGNOSIS — N186 End stage renal disease: Secondary | ICD-10-CM | POA: Diagnosis not present

## 2018-05-06 DIAGNOSIS — Z992 Dependence on renal dialysis: Secondary | ICD-10-CM | POA: Diagnosis not present

## 2018-05-09 ENCOUNTER — Telehealth: Payer: Self-pay | Admitting: Gastroenterology

## 2018-05-09 DIAGNOSIS — N186 End stage renal disease: Secondary | ICD-10-CM | POA: Diagnosis not present

## 2018-05-09 DIAGNOSIS — Z992 Dependence on renal dialysis: Secondary | ICD-10-CM | POA: Diagnosis not present

## 2018-05-09 DIAGNOSIS — D509 Iron deficiency anemia, unspecified: Secondary | ICD-10-CM | POA: Diagnosis not present

## 2018-05-09 DIAGNOSIS — D631 Anemia in chronic kidney disease: Secondary | ICD-10-CM | POA: Diagnosis not present

## 2018-05-09 DIAGNOSIS — N2581 Secondary hyperparathyroidism of renal origin: Secondary | ICD-10-CM | POA: Diagnosis not present

## 2018-05-09 NOTE — Telephone Encounter (Signed)
Pt would like a call for Lab results and Ultrasound results she also states she never received a diagnosis

## 2018-05-10 DIAGNOSIS — B182 Chronic viral hepatitis C: Secondary | ICD-10-CM | POA: Insufficient documentation

## 2018-05-10 DIAGNOSIS — L4050 Arthropathic psoriasis, unspecified: Secondary | ICD-10-CM | POA: Diagnosis not present

## 2018-05-10 DIAGNOSIS — N186 End stage renal disease: Secondary | ICD-10-CM | POA: Diagnosis not present

## 2018-05-10 DIAGNOSIS — E1142 Type 2 diabetes mellitus with diabetic polyneuropathy: Secondary | ICD-10-CM | POA: Diagnosis not present

## 2018-05-10 DIAGNOSIS — M19041 Primary osteoarthritis, right hand: Secondary | ICD-10-CM | POA: Diagnosis not present

## 2018-05-10 DIAGNOSIS — Z8619 Personal history of other infectious and parasitic diseases: Secondary | ICD-10-CM | POA: Insufficient documentation

## 2018-05-10 DIAGNOSIS — L409 Psoriasis, unspecified: Secondary | ICD-10-CM | POA: Diagnosis not present

## 2018-05-10 DIAGNOSIS — Z992 Dependence on renal dialysis: Secondary | ICD-10-CM | POA: Diagnosis not present

## 2018-05-10 DIAGNOSIS — M19042 Primary osteoarthritis, left hand: Secondary | ICD-10-CM | POA: Diagnosis not present

## 2018-05-11 DIAGNOSIS — D509 Iron deficiency anemia, unspecified: Secondary | ICD-10-CM | POA: Diagnosis not present

## 2018-05-11 DIAGNOSIS — N2581 Secondary hyperparathyroidism of renal origin: Secondary | ICD-10-CM | POA: Diagnosis not present

## 2018-05-11 DIAGNOSIS — N186 End stage renal disease: Secondary | ICD-10-CM | POA: Diagnosis not present

## 2018-05-11 DIAGNOSIS — Z992 Dependence on renal dialysis: Secondary | ICD-10-CM | POA: Diagnosis not present

## 2018-05-11 DIAGNOSIS — D631 Anemia in chronic kidney disease: Secondary | ICD-10-CM | POA: Diagnosis not present

## 2018-05-11 NOTE — Telephone Encounter (Signed)
Left VM asking pt to return call

## 2018-05-11 NOTE — Telephone Encounter (Signed)
Patient has been notified of lab results and diagnosis, pt wants to know why she can not be cured of hep C and what about her Diarrhea, she is not worried about the hemorrhoid just wants the diarrhea to stop, and she states she will not be coming back due to she still has diarrhea, please advise

## 2018-05-13 DIAGNOSIS — N186 End stage renal disease: Secondary | ICD-10-CM | POA: Diagnosis not present

## 2018-05-13 DIAGNOSIS — D509 Iron deficiency anemia, unspecified: Secondary | ICD-10-CM | POA: Diagnosis not present

## 2018-05-13 DIAGNOSIS — D631 Anemia in chronic kidney disease: Secondary | ICD-10-CM | POA: Diagnosis not present

## 2018-05-13 DIAGNOSIS — Z992 Dependence on renal dialysis: Secondary | ICD-10-CM | POA: Diagnosis not present

## 2018-05-13 DIAGNOSIS — N2581 Secondary hyperparathyroidism of renal origin: Secondary | ICD-10-CM | POA: Diagnosis not present

## 2018-05-16 DIAGNOSIS — N2581 Secondary hyperparathyroidism of renal origin: Secondary | ICD-10-CM | POA: Diagnosis not present

## 2018-05-16 DIAGNOSIS — D631 Anemia in chronic kidney disease: Secondary | ICD-10-CM | POA: Diagnosis not present

## 2018-05-16 DIAGNOSIS — Z992 Dependence on renal dialysis: Secondary | ICD-10-CM | POA: Diagnosis not present

## 2018-05-16 DIAGNOSIS — D509 Iron deficiency anemia, unspecified: Secondary | ICD-10-CM | POA: Diagnosis not present

## 2018-05-16 DIAGNOSIS — N186 End stage renal disease: Secondary | ICD-10-CM | POA: Diagnosis not present

## 2018-05-18 DIAGNOSIS — N2581 Secondary hyperparathyroidism of renal origin: Secondary | ICD-10-CM | POA: Diagnosis not present

## 2018-05-18 DIAGNOSIS — D509 Iron deficiency anemia, unspecified: Secondary | ICD-10-CM | POA: Diagnosis not present

## 2018-05-18 DIAGNOSIS — N186 End stage renal disease: Secondary | ICD-10-CM | POA: Diagnosis not present

## 2018-05-18 DIAGNOSIS — Z992 Dependence on renal dialysis: Secondary | ICD-10-CM | POA: Diagnosis not present

## 2018-05-18 DIAGNOSIS — D631 Anemia in chronic kidney disease: Secondary | ICD-10-CM | POA: Diagnosis not present

## 2018-05-20 DIAGNOSIS — D509 Iron deficiency anemia, unspecified: Secondary | ICD-10-CM | POA: Diagnosis not present

## 2018-05-20 DIAGNOSIS — N2581 Secondary hyperparathyroidism of renal origin: Secondary | ICD-10-CM | POA: Diagnosis not present

## 2018-05-20 DIAGNOSIS — D631 Anemia in chronic kidney disease: Secondary | ICD-10-CM | POA: Diagnosis not present

## 2018-05-20 DIAGNOSIS — N186 End stage renal disease: Secondary | ICD-10-CM | POA: Diagnosis not present

## 2018-05-20 DIAGNOSIS — Z992 Dependence on renal dialysis: Secondary | ICD-10-CM | POA: Diagnosis not present

## 2018-05-23 DIAGNOSIS — Z992 Dependence on renal dialysis: Secondary | ICD-10-CM | POA: Diagnosis not present

## 2018-05-23 DIAGNOSIS — D631 Anemia in chronic kidney disease: Secondary | ICD-10-CM | POA: Diagnosis not present

## 2018-05-23 DIAGNOSIS — D509 Iron deficiency anemia, unspecified: Secondary | ICD-10-CM | POA: Diagnosis not present

## 2018-05-23 DIAGNOSIS — N186 End stage renal disease: Secondary | ICD-10-CM | POA: Diagnosis not present

## 2018-05-23 DIAGNOSIS — N2581 Secondary hyperparathyroidism of renal origin: Secondary | ICD-10-CM | POA: Diagnosis not present

## 2018-05-24 DIAGNOSIS — L409 Psoriasis, unspecified: Secondary | ICD-10-CM | POA: Diagnosis not present

## 2018-05-24 DIAGNOSIS — Z992 Dependence on renal dialysis: Secondary | ICD-10-CM | POA: Diagnosis not present

## 2018-05-24 DIAGNOSIS — L4050 Arthropathic psoriasis, unspecified: Secondary | ICD-10-CM | POA: Diagnosis not present

## 2018-05-24 DIAGNOSIS — N186 End stage renal disease: Secondary | ICD-10-CM | POA: Diagnosis not present

## 2018-05-24 DIAGNOSIS — E1142 Type 2 diabetes mellitus with diabetic polyneuropathy: Secondary | ICD-10-CM | POA: Diagnosis not present

## 2018-05-24 DIAGNOSIS — M0579 Rheumatoid arthritis with rheumatoid factor of multiple sites without organ or systems involvement: Secondary | ICD-10-CM | POA: Insufficient documentation

## 2018-05-25 DIAGNOSIS — I120 Hypertensive chronic kidney disease with stage 5 chronic kidney disease or end stage renal disease: Secondary | ICD-10-CM | POA: Diagnosis not present

## 2018-05-25 DIAGNOSIS — Z992 Dependence on renal dialysis: Secondary | ICD-10-CM | POA: Diagnosis not present

## 2018-05-25 DIAGNOSIS — E1122 Type 2 diabetes mellitus with diabetic chronic kidney disease: Secondary | ICD-10-CM | POA: Diagnosis not present

## 2018-05-25 DIAGNOSIS — T82898A Other specified complication of vascular prosthetic devices, implants and grafts, initial encounter: Secondary | ICD-10-CM | POA: Diagnosis not present

## 2018-05-25 DIAGNOSIS — D631 Anemia in chronic kidney disease: Secondary | ICD-10-CM | POA: Diagnosis not present

## 2018-05-25 DIAGNOSIS — T82590A Other mechanical complication of surgically created arteriovenous fistula, initial encounter: Secondary | ICD-10-CM | POA: Diagnosis not present

## 2018-05-25 DIAGNOSIS — T82856A Stenosis of peripheral vascular stent, initial encounter: Secondary | ICD-10-CM | POA: Diagnosis not present

## 2018-05-25 DIAGNOSIS — N186 End stage renal disease: Secondary | ICD-10-CM | POA: Diagnosis not present

## 2018-05-27 DIAGNOSIS — N186 End stage renal disease: Secondary | ICD-10-CM | POA: Diagnosis not present

## 2018-05-27 DIAGNOSIS — Z992 Dependence on renal dialysis: Secondary | ICD-10-CM | POA: Diagnosis not present

## 2018-05-27 DIAGNOSIS — D631 Anemia in chronic kidney disease: Secondary | ICD-10-CM | POA: Diagnosis not present

## 2018-05-27 DIAGNOSIS — D509 Iron deficiency anemia, unspecified: Secondary | ICD-10-CM | POA: Diagnosis not present

## 2018-05-27 DIAGNOSIS — N2581 Secondary hyperparathyroidism of renal origin: Secondary | ICD-10-CM | POA: Diagnosis not present

## 2018-05-30 DIAGNOSIS — N186 End stage renal disease: Secondary | ICD-10-CM | POA: Diagnosis not present

## 2018-05-30 DIAGNOSIS — Z992 Dependence on renal dialysis: Secondary | ICD-10-CM | POA: Diagnosis not present

## 2018-05-30 DIAGNOSIS — N2581 Secondary hyperparathyroidism of renal origin: Secondary | ICD-10-CM | POA: Diagnosis not present

## 2018-05-30 DIAGNOSIS — D631 Anemia in chronic kidney disease: Secondary | ICD-10-CM | POA: Diagnosis not present

## 2018-05-30 DIAGNOSIS — D509 Iron deficiency anemia, unspecified: Secondary | ICD-10-CM | POA: Diagnosis not present

## 2018-05-31 ENCOUNTER — Ambulatory Visit (INDEPENDENT_AMBULATORY_CARE_PROVIDER_SITE_OTHER): Payer: Medicare Other | Admitting: Cardiovascular Disease

## 2018-05-31 ENCOUNTER — Encounter: Payer: Self-pay | Admitting: Cardiovascular Disease

## 2018-05-31 ENCOUNTER — Ambulatory Visit (INDEPENDENT_AMBULATORY_CARE_PROVIDER_SITE_OTHER): Payer: Medicare Other

## 2018-05-31 VITALS — BP 153/83 | HR 72 | Ht 67.0 in | Wt 216.2 lb

## 2018-05-31 DIAGNOSIS — R002 Palpitations: Secondary | ICD-10-CM

## 2018-05-31 DIAGNOSIS — R55 Syncope and collapse: Secondary | ICD-10-CM | POA: Diagnosis not present

## 2018-05-31 DIAGNOSIS — R0602 Shortness of breath: Secondary | ICD-10-CM

## 2018-05-31 DIAGNOSIS — I10 Essential (primary) hypertension: Secondary | ICD-10-CM | POA: Diagnosis not present

## 2018-05-31 NOTE — Patient Instructions (Signed)
Medication Instructions: Your physician recommends that you continue on your current medications as directed. Please refer to the Current Medication list given to you today.  If you need a refill on your cardiac medications before your next appointment, please call your pharmacy.   Procedures/Testing: Your provider has requested that you wear a 72-hour Zio monitor.  Your physician has requested that you have an echocardiogram. Echocardiography is a painless test that uses sound waves to create images of your heart. It provides your doctor with information about the size and shape of your heart and how well your heart's chambers and valves are working. You may receive an ultrasound enhancing agent through an IV if needed to better visualize your heart during the echo.This procedure takes approximately one hour. There are no restrictions for this procedure. This will take place at the [facility] clinic.    Follow-Up: Your physician wants you to follow-up in one month with Dr. Fletcher Anon.   Special Instructions: Please check your blood pressure in the morning and evening for the next two weeks. Please bring this log with you at your next appointment.   Thank you for choosing [facility] at [facility]!

## 2018-05-31 NOTE — Progress Notes (Signed)
Cardiology Office Note   Date:  05/31/2018   ID:  [name], [name] 1966/08/22, MRN [id number]  PCP:  Mikey College, NP  Cardiologist:   Kathlyn Sacramento, MD   Chief Complaint  Patient presents with  . other    Ref by Cassell Smiles for labile HTN. Meds reviewed by the pt. verbally. Pt. was being followed by Dr. Ubaldo Glassing at [facility] clinic. Pt. c/o dizzness, syncope with head and neck heaviness.       History of Present Illness: [name] is a 53 y.o. female who is self-referred for evaluation of dizziness and presyncope with fluctuation in blood pressure.  She has no prior cardiac history and was seen by Dr. Ubaldo Glassing in 2017 for atypical chest pain.  She had an echocardiogram and a stress test at that time that were unremarkable.  EF was 50 to 55%.  She has history of end-stage renal disease on hemodialysis for the last 3 years, diabetes mellitus since 1989, essential hypertension and tobacco use. She reports recent episodes of dizziness and near syncope.  She usually starts feeling heaviness in her head and shoulders followed by extreme dizziness.  There has been no syncopal episodes.  She is concerned about her blood pressure running low during these episodes but have not checked her blood pressure.  She also reports increased palpitations around the same time.  He denies chest pain but she does report exertional dyspnea which has worsened.    Past Medical History:  Diagnosis Date  . Allergy   . Anemia   . Anxiety    worse when not at home - bowel incontinence  . Arthritis   . Ataxia   . CHF (congestive heart failure) (HCC)    history of, has been cleared.  . Chronic kidney disease   . COPD (chronic obstructive pulmonary disease) ([place])   . Depression   . Diabetes mellitus without complication ([place])   . Emphysema of lung ([place])   . End stage renal disease ([place])    MWF dialysis  . Fistula    left upper arm  . Headache    migraines  . Hepatitis 2003   Hep C  . Hiatal  hernia   . Hypercholesterolemia    pt denies  . Hypertension   . Kidney disease   . Neuropathy, diabetic (HCC)    lower legs  . Peripheral vascular disease ([place])   . Pneumonia 2015  . Psoriasis   . Tobacco dependence   . Wears dentures    full upper, partial lower    Past Surgical History:  Procedure Laterality Date  . A/V SHUNT INTERVENTION N/A 12/16/2017   Procedure: A/V SHUNT INTERVENTION;  Surgeon: Algernon Huxley, MD;  Location: [place] [facility];  Service: Cardiovascular;  Laterality: N/A;  . AV FISTULA PLACEMENT Left 11/2014  . CESAREAN SECTION    . CHOLECYSTECTOMY    . COLONOSCOPY N/A 09/22/2017   Procedure: COLONOSCOPY;  Surgeon: Lin Landsman, MD;  Location: [place];  Service: Endoscopy;  Laterality: N/A;  . cyst removed  from left hand Left 1989  . DIALYSIS/PERMA CATHETER INSERTION N/A 05/20/2017   Procedure: Dialysis/Perma Catheter Insertion and fistulagram/LUE angiogram;  Surgeon: Algernon Huxley, MD;  Location: [place] [facility];  Service: Cardiovascular;  Laterality: N/A;  . ESOPHAGOGASTRODUODENOSCOPY N/A 09/22/2017   Procedure: ESOPHAGOGASTRODUODENOSCOPY (EGD);  Surgeon: Lin Landsman, MD;  Location: [place];  Service: Endoscopy;  Laterality: N/A;  . INCISION AND  DRAINAGE ABSCESS N/A 07/16/2016   Procedure: INCISION AND DRAINAGE ABSCESS;  Surgeon: Carloyn Manner, MD;  Location: [facility];  Service: ENT;  Laterality: N/A;  . PERIPHERAL VASCULAR CATHETERIZATION N/A 07/25/2015   Procedure: A/V Shuntogram/Fistulagram;  Surgeon: Algernon Huxley, MD;  Location: [place] [facility];  Service: Cardiovascular;  Laterality: N/A;  . PERIPHERAL VASCULAR CATHETERIZATION Left 07/25/2015   Procedure: A/V Shunt Intervention;  Surgeon: Algernon Huxley, MD;  Location: [employer] [facility];  Service: Cardiovascular;  Laterality: Left;  . PERIPHERAL VASCULAR CATHETERIZATION Left 10/07/2015   Procedure: A/V Shuntogram/Fistulagram;  Surgeon: Algernon Huxley, MD;   Location: [place] [facility];  Service: Cardiovascular;  Laterality: Left;  . PERIPHERAL VASCULAR CATHETERIZATION N/A 10/07/2015   Procedure: A/V Shunt Intervention;  Surgeon: Algernon Huxley, MD;  Location: [place] [facility];  Service: Cardiovascular;  Laterality: N/A;  . PERIPHERAL VASCULAR CATHETERIZATION  10/07/2015   Procedure: Dialysis/Perma Catheter Insertion;  Surgeon: Algernon Huxley, MD;  Location: [place] [facility];  Service: Cardiovascular;;  . PERIPHERAL VASCULAR CATHETERIZATION N/A 12/17/2015   Procedure: Dialysis/Perma Catheter Removal;  Surgeon: Katha Cabal, MD;  Location: [place] [facility];  Service: Cardiovascular;  Laterality: N/A;  . PERIPHERAL VASCULAR CATHETERIZATION N/A 01/11/2017   Procedure: Visceral Angiography;  Surgeon: Algernon Huxley, MD;  Location: [place] [facility];  Service: Cardiovascular;  Laterality: N/A;  . PERIPHERAL VASCULAR CATHETERIZATION N/A 01/11/2017   Procedure: Visceral Artery Intervention;  Surgeon: Algernon Huxley, MD;  Location: [place] [facility];  Service: Cardiovascular;  Laterality: N/A;  . POLYPECTOMY  09/22/2017   Procedure: POLYPECTOMY;  Surgeon: Lin Landsman, MD;  Location: [place];  Service: Endoscopy;;  . rt. tubal and ovary removed    . STENT PLACEMENT VASCULAR ([place] [facility]) Left 03/2018   Performed at [facility] using EV3 protg GPS stent graph [id number] lot [id number]  . TONSILLECTOMY    . TUBAL LIGATION       Current Outpatient Medications  Medication Sig Dispense Refill  . Adalimumab (HUMIRA PEN [place]) Inject into the skin every 14 (fourteen) days.    . calcium acetate (PHOSLO) 667 MG capsule Take 1,334 mg by mouth 3 (three) times daily with meals.     . clobetasol (OLUX) 0.05 % topical foam Apply topically 2 (two) times daily.    . clobetasol ointment (TEMOVATE) 7.03 % Apply 1 application topically daily as needed (for psorasis).     Marland Kitchen diltiazem (TIAZAC) 120 MG 24 hr capsule Take 240 mg by  mouth 2 (two) times daily.     Marland Kitchen glucose blood (TRUE METRIX BLOOD GLUCOSE TEST) test strip Use as instructed 100 each 12  . insulin aspart (NOVOLOG) 100 UNIT/ML injection Inject 1-2 Units into the skin 3 (three) times daily before meals. 10 mL 11  . insulin glargine (LANTUS) 100 UNIT/ML injection Inject 15 Units into the skin daily.     . Insulin Syringe 27G X 1/2" 1 ML MISC 1 Syringe by Does not apply route 4 (four) times daily. 120 each 4  . levocetirizine (XYZAL) 5 MG tablet TAKE 1 TABLET(5 MG) BY MOUTH EVERY EVENING 30 tablet 11  . loperamide (IMODIUM A-D) 2 MG tablet Take 2 mg by mouth 3 (three) times daily as needed.     . midodrine (PROAMATINE) 5 MG tablet TK 1 T PO  DURING DIALYSIS TREATMENT FOR LOW BP  5  . ondansetron (ZOFRAN) 8 MG tablet Take by mouth.    Marland Kitchen  sevelamer carbonate (RENVELA) 800 MG tablet Take 1,600 mg by mouth 3 (three) times daily with meals.    . torsemide (DEMADEX) 100 MG tablet Take 100 mg by mouth daily.    . VOLTAREN 1 % GEL APPLY 4 GRAMS TOPICALLY QID  2   No current facility-administered medications for this visit.     Allergies:   Cinnamon; Garlic; Onion; Tylenol [acetaminophen]; Ciprofloxacin; and Prednisone    Social History:  The patient  reports that she has been smoking cigarettes.  She has a 5.00 pack-year smoking history. She has never used smokeless tobacco. She reports that she does not drink alcohol or use drugs.   Family History:  The patient's family history includes Heart disease in her mother; Hypertension in her father and mother; Skin cancer in her father.    ROS:  Please see the history of present illness.   Otherwise, review of systems are positive for none.   All other systems are reviewed and negative.    PHYSICAL EXAM: VS:  BP (!) 153/83 (BP Location: Right Arm, Patient Position: Sitting, Cuff Size: Large)   Pulse 72   Ht 5\' 7"  (1.702 m)   Wt 216 lb 4 oz (98.1 kg)   BMI 33.87 kg/m  , BMI Body mass index is 33.87 kg/m. GEN: Well  nourished, well developed, in no acute distress  HEENT: normal  Neck: no JVD, carotid bruits, or masses Cardiac: RRR; no rubs, or gallops,no edema 1 out of 6 systolic murmur in the aortic area.  Respiratory:  clear to auscultation bilaterally, normal work of breathing GI: soft, nontender, nondistended, + BS MS: no deformity or atrophy  Skin: warm and dry, no rash Neuro:  Strength and sensation are intact Psych: euthymic mood, full affect   EKG:  EKG is ordered today. The ekg ordered today demonstrates sinus rhythm with left atrial enlargement and left ventricular hypertrophy.   Recent Labs: 12/14/2017: Magnesium 2.4 12/27/2017: BUN 75; Creat 7.43; Potassium 5.2; Sodium 133 04/07/2018: ALT 13; Hemoglobin 11.7; Platelets 193    Lipid Panel    Component Value Date/Time   CHOL 157 10/15/2014 0402   TRIG 83 10/15/2014 0402   HDL 49 10/15/2014 0402   VLDL 17 10/15/2014 0402   LDLCALC 91 10/15/2014 0402      Wt Readings from Last 3 Encounters:  05/31/18 216 lb 4 oz (98.1 kg)  03/29/18 216 lb 12.8 oz (98.3 kg)  03/22/18 214 lb 9.6 oz (97.3 kg)        PAD Screen 05/31/2018  Previous surgical procedure? Yes  Dates of procedures Performed at [facility] using EV3 protg GPS stent graph [id number] lot [id number]  Pain with walking? No  Feet/toe relief with dangling? No  Painful, non-healing ulcers? Yes  Extremities discolored? No      ASSESSMENT AND PLAN:  1.  Presyncope: Certainly the episodes could be related to hypotension but that is usually more typical on her dialysis days.  She describes episodes outside of her dialysis days.  She is not significantly orthostatic today.  I asked her to start recording her blood pressure twice daily and bring the readings with her next follow-up. She does report palpitations around the same time and thus I requested a 3-day outpatient monitor to evaluate for arrhythmia.  2.  Dyspnea: I requested an echocardiogram to  evaluate LV systolic function.  3.  Essential hypertension: Blood pressure fluctuates but tends to be on the high side.  She also reports intolerance to multiple  medications.  She is currently only on diltiazem.  Will adjust based on her home blood pressure readings.  4.  End-stage renal disease on hemodialysis: Managed by nephrology.    Disposition:   FU with me in 1-2 months  Signed,  Kathlyn Sacramento, MD  05/31/2018 2:10 PM    [place]

## 2018-06-01 DIAGNOSIS — Z794 Long term (current) use of insulin: Secondary | ICD-10-CM | POA: Diagnosis not present

## 2018-06-01 DIAGNOSIS — D631 Anemia in chronic kidney disease: Secondary | ICD-10-CM | POA: Diagnosis not present

## 2018-06-01 DIAGNOSIS — Z992 Dependence on renal dialysis: Secondary | ICD-10-CM | POA: Diagnosis not present

## 2018-06-01 DIAGNOSIS — E1140 Type 2 diabetes mellitus with diabetic neuropathy, unspecified: Secondary | ICD-10-CM | POA: Diagnosis not present

## 2018-06-01 DIAGNOSIS — D509 Iron deficiency anemia, unspecified: Secondary | ICD-10-CM | POA: Diagnosis not present

## 2018-06-01 DIAGNOSIS — L97521 Non-pressure chronic ulcer of other part of left foot limited to breakdown of skin: Secondary | ICD-10-CM | POA: Diagnosis not present

## 2018-06-01 DIAGNOSIS — N2581 Secondary hyperparathyroidism of renal origin: Secondary | ICD-10-CM | POA: Diagnosis not present

## 2018-06-01 DIAGNOSIS — N186 End stage renal disease: Secondary | ICD-10-CM | POA: Diagnosis not present

## 2018-06-02 ENCOUNTER — Telehealth: Payer: Self-pay | Admitting: Cardiovascular Disease

## 2018-06-02 DIAGNOSIS — R002 Palpitations: Secondary | ICD-10-CM

## 2018-06-02 NOTE — Telephone Encounter (Signed)
Pt states that her monitor has been coming off and she contacted the company and the advised her to let us know.

## 2018-06-02 NOTE — Telephone Encounter (Signed)
Spoke with patient and reviewed that when report comes in we will see if enough data has been recorded and if not then we could do another one. She verbalized understanding, was agreeable with plan, and had no further questions. Will make provider aware as well.

## 2018-06-03 DIAGNOSIS — N2581 Secondary hyperparathyroidism of renal origin: Secondary | ICD-10-CM | POA: Diagnosis not present

## 2018-06-03 DIAGNOSIS — D631 Anemia in chronic kidney disease: Secondary | ICD-10-CM | POA: Diagnosis not present

## 2018-06-03 DIAGNOSIS — Z992 Dependence on renal dialysis: Secondary | ICD-10-CM | POA: Diagnosis not present

## 2018-06-03 DIAGNOSIS — N186 End stage renal disease: Secondary | ICD-10-CM | POA: Diagnosis not present

## 2018-06-03 DIAGNOSIS — D509 Iron deficiency anemia, unspecified: Secondary | ICD-10-CM | POA: Diagnosis not present

## 2018-06-06 DIAGNOSIS — Z992 Dependence on renal dialysis: Secondary | ICD-10-CM | POA: Diagnosis not present

## 2018-06-06 DIAGNOSIS — N186 End stage renal disease: Secondary | ICD-10-CM | POA: Diagnosis not present

## 2018-06-06 DIAGNOSIS — D509 Iron deficiency anemia, unspecified: Secondary | ICD-10-CM | POA: Diagnosis not present

## 2018-06-06 DIAGNOSIS — D631 Anemia in chronic kidney disease: Secondary | ICD-10-CM | POA: Diagnosis not present

## 2018-06-06 DIAGNOSIS — N2581 Secondary hyperparathyroidism of renal origin: Secondary | ICD-10-CM | POA: Diagnosis not present

## 2018-06-08 DIAGNOSIS — N2581 Secondary hyperparathyroidism of renal origin: Secondary | ICD-10-CM | POA: Diagnosis not present

## 2018-06-08 DIAGNOSIS — D631 Anemia in chronic kidney disease: Secondary | ICD-10-CM | POA: Diagnosis not present

## 2018-06-08 DIAGNOSIS — N186 End stage renal disease: Secondary | ICD-10-CM | POA: Diagnosis not present

## 2018-06-08 DIAGNOSIS — Z992 Dependence on renal dialysis: Secondary | ICD-10-CM | POA: Diagnosis not present

## 2018-06-08 DIAGNOSIS — D509 Iron deficiency anemia, unspecified: Secondary | ICD-10-CM | POA: Diagnosis not present

## 2018-06-09 ENCOUNTER — Ambulatory Visit (INDEPENDENT_AMBULATORY_CARE_PROVIDER_SITE_OTHER): Payer: Medicare Other

## 2018-06-09 ENCOUNTER — Other Ambulatory Visit: Payer: Self-pay

## 2018-06-09 DIAGNOSIS — R0602 Shortness of breath: Secondary | ICD-10-CM

## 2018-06-10 DIAGNOSIS — N2581 Secondary hyperparathyroidism of renal origin: Secondary | ICD-10-CM | POA: Diagnosis not present

## 2018-06-10 DIAGNOSIS — Z992 Dependence on renal dialysis: Secondary | ICD-10-CM | POA: Diagnosis not present

## 2018-06-10 DIAGNOSIS — D509 Iron deficiency anemia, unspecified: Secondary | ICD-10-CM | POA: Diagnosis not present

## 2018-06-10 DIAGNOSIS — N186 End stage renal disease: Secondary | ICD-10-CM | POA: Diagnosis not present

## 2018-06-10 DIAGNOSIS — D631 Anemia in chronic kidney disease: Secondary | ICD-10-CM | POA: Diagnosis not present

## 2018-06-13 DIAGNOSIS — N186 End stage renal disease: Secondary | ICD-10-CM | POA: Diagnosis not present

## 2018-06-13 DIAGNOSIS — N2581 Secondary hyperparathyroidism of renal origin: Secondary | ICD-10-CM | POA: Diagnosis not present

## 2018-06-13 DIAGNOSIS — D509 Iron deficiency anemia, unspecified: Secondary | ICD-10-CM | POA: Diagnosis not present

## 2018-06-13 DIAGNOSIS — D631 Anemia in chronic kidney disease: Secondary | ICD-10-CM | POA: Diagnosis not present

## 2018-06-13 DIAGNOSIS — Z992 Dependence on renal dialysis: Secondary | ICD-10-CM | POA: Diagnosis not present

## 2018-06-13 LAB — BASIC METABOLIC PANEL: CREATININE: 7 — AB (ref <=1.1)

## 2018-06-15 DIAGNOSIS — D509 Iron deficiency anemia, unspecified: Secondary | ICD-10-CM | POA: Diagnosis not present

## 2018-06-15 DIAGNOSIS — D631 Anemia in chronic kidney disease: Secondary | ICD-10-CM | POA: Diagnosis not present

## 2018-06-15 DIAGNOSIS — Z992 Dependence on renal dialysis: Secondary | ICD-10-CM | POA: Diagnosis not present

## 2018-06-15 DIAGNOSIS — N2581 Secondary hyperparathyroidism of renal origin: Secondary | ICD-10-CM | POA: Diagnosis not present

## 2018-06-15 DIAGNOSIS — N186 End stage renal disease: Secondary | ICD-10-CM | POA: Diagnosis not present

## 2018-06-17 ENCOUNTER — Other Ambulatory Visit: Payer: Self-pay

## 2018-06-17 ENCOUNTER — Ambulatory Visit (INDEPENDENT_AMBULATORY_CARE_PROVIDER_SITE_OTHER): Payer: Medicare Other | Admitting: Gastroenterology

## 2018-06-17 ENCOUNTER — Encounter

## 2018-06-17 ENCOUNTER — Encounter: Payer: Self-pay | Admitting: Gastroenterology

## 2018-06-17 VITALS — BP 142/70 | HR 77 | Resp 17 | Ht 67.0 in | Wt 220.8 lb

## 2018-06-17 DIAGNOSIS — D631 Anemia in chronic kidney disease: Secondary | ICD-10-CM | POA: Diagnosis not present

## 2018-06-17 DIAGNOSIS — K5909 Other constipation: Secondary | ICD-10-CM | POA: Diagnosis not present

## 2018-06-17 DIAGNOSIS — Z992 Dependence on renal dialysis: Secondary | ICD-10-CM | POA: Diagnosis not present

## 2018-06-17 DIAGNOSIS — D509 Iron deficiency anemia, unspecified: Secondary | ICD-10-CM | POA: Diagnosis not present

## 2018-06-17 DIAGNOSIS — R635 Abnormal weight gain: Secondary | ICD-10-CM

## 2018-06-17 DIAGNOSIS — N186 End stage renal disease: Secondary | ICD-10-CM | POA: Diagnosis not present

## 2018-06-17 DIAGNOSIS — N2581 Secondary hyperparathyroidism of renal origin: Secondary | ICD-10-CM | POA: Diagnosis not present

## 2018-06-17 MED ORDER — NA SULFATE-K SULFATE-MG SULF 17.5-3.13-1.6 GM/177ML PO SOLN: 1.0000 | Freq: Once | ORAL | 0 refills | Status: AC

## 2018-06-17 NOTE — Telephone Encounter (Signed)
Patient returning call for monitor results States she is at another doctor appointment at the moment  Please wait an hour or so to return call

## 2018-06-17 NOTE — Telephone Encounter (Signed)
Someone answered and stated that patient is at dialysis and requested that we call back on Monday.

## 2018-06-17 NOTE — Progress Notes (Signed)
Olivia Darby, MD [address]  [place]  [place], [place] [address]  Main: [phone]  Fax: [phone]    Gastroenterology Consultation  Referring Provider:     Mikey College, * Primary Care Physician:  Mikey College, NP Primary Gastroenterologist:  Dr. Cephas Wilson Reason for Consultation:     Chronic hepatitis C, chronic liver disease, chronic diarrhea        HPI:   [name] is a 52 y.o. y/o female referred by Dr. Merrilyn Puma, Jerrel Ivory, NP  for consultation & management of Chronic diarrhea, chronic hepatitis C and cirrhosis  She has multiple comorbidities as listed below. She was initially seen by Dr. Vicente Males in 11/2016 in our clinic for management of chronic hepatitis C and chronic diarrhea. At the time he recommended upper endoscopy, colonoscopy and workup for her liver disease. She reported that she moved to [facility] and was lost to follow-up. Prior to this she was also seen in [place] clinic in 04/2016 for chronic diarrhea, hepatitis C and dysphagia.  She was lost to follow-up at that time as well. Currently, she lives in [place], [place].   She has been having chronic diarrhea for more than a year intermittently. Most recent episode started about 10 days ago, after she received IV iron and Epogen at the time of dialysis. She reports having about 4-5 episodes of runny, nonbloody bowel movements. She describes that her stool was black when the diarrhea episode started, currently her stools are light yellow in color. She reports upper abdominal pain and associated with significant postprandial bloating. She denies urgency or incontinence, fever or chills, nausea or vomiting. She denies any weight loss, concern about weight gain especially in her waist. She denies swelling of legs. He denies hematemesis or coffee-ground emesis. She does have chronic normocytic anemia, and receives Epogen with her dialysis.  Hepatitis C Diagnosed in 2003- last used cocaine 6  years back , has had professional tatoos, has been incarcerated, no Armed forces logistics/support/administrative officer. Does not drink alcohol presently, no excess use in the past . No family history of liver disease. Genotype 1a,  high viral load, treatment nave, chronic liver disease manifested as splenomegaly, mild thrombocytopenia, hypoalbuminemia, fibrosis score 0.66, stage F3 in 07/2014. She is immune to hepatitis A and B. And, HIV nonreactive   Ferritin 45 in 02/2013 Her hemoglobin A1c is 8.3 in 06/2017 H Pylori stool antigen negative in 11/2016 Has been on oxycodone for chronic back pain   Follow up visit 10/01/2017 She is here for a follow-up earlier than she is scheduled with me. She did not get the labs that I ordered from last visit. She tried to go to the lab on 09/28/2017 but she had an accident in her pants secondary to fecal incontinence and had to leave right to the. She continues to have intermittent nonbloody diarrhea associated with urgency and incontinence. She took Imodium 2 pills 2 days ago. Did not have bowel movement yet. Her colon biopsies revealed focal active colitis with no background of chronicity. She cut back on smoking to 2 cigarettes a day  Follow-up visit 11/09/17: Overall, she feels good.  She denies having diarrhea anymore.  She is no longer on Imodium.  Currently, she is constipated since starting phosphate binders that contains calcium in it.  She is waiting to start hepatitis C treatment.  She got significantly cut back on smoking cigarettes.  She smoked only 4 cigarettes last week.    Follow-up visit 03/22/2018  She completed treatment for hepatitis C with Mavyret for 12 weeks. She reports that she finished treatment in first week of March. Overall, she says she hasn't been feeling well. Has been dealing with constipation associated with overflow diarrhea. She is struggling to find an appropriate diet that is suitable with her history of end-stage renal disease as well as that contains high fiber  to help with constipation. She does consume red meat, carbonated beverages, sweetened iced tea regularly. Her hemoglobin A1c has significantly improved. She does not have symptoms related to her cirrhosis.  Follow-up visit 06/17/2018 She is concerned about weight gain, 17 pounds in last 6 months associated with severe constipation. Her nephrologist does not think she is retaining fluid.she is also started on Humira for her psoriatic arthritis and on during her constipation is related to initiation of Humira. Otherwise, she denies any complaints.   GI Procedures:  EGD 09/22/2017 - Normal duodenal bulb and second portion of the duodenum. Biopsied. - Non-bleeding erosive gastropathy. Biopsied. - 1 cm hiatal hernia. - Z-line irregular. Biopsied. - Normal esophagus. Colonoscopy 09/22/2017    Past Medical History:  Diagnosis Date  . Allergy   . Anemia   . Anxiety    worse when not at home - bowel incontinence  . Arthritis   . Ataxia   . CHF (congestive heart failure) (HCC)    history of, has been cleared.  . Chronic kidney disease   . COPD (chronic obstructive pulmonary disease) ([place])   . Depression   . Diabetes mellitus without complication ([place])   . Emphysema of lung ([place])   . End stage renal disease ([place])    MWF dialysis  . Fistula    left upper arm  . Headache    migraines  . Hepatitis 2003   Hep C  . Hiatal hernia   . Hypercholesterolemia    pt denies  . Hypertension   . Kidney disease   . Neuropathy, diabetic (HCC)    lower legs  . Peripheral vascular disease ([place])   . Pneumonia 2015  . Psoriasis   . Tobacco dependence   . Wears dentures    full upper, partial lower    Past Surgical History:  Procedure Laterality Date  . A/V SHUNT INTERVENTION N/A 12/16/2017   Procedure: A/V SHUNT INTERVENTION;  Surgeon: Algernon Huxley, MD;  Location: [facility];  Service: Cardiovascular;  Laterality: N/A;  . AV FISTULA PLACEMENT Left 11/2014  . CESAREAN SECTION      . CHOLECYSTECTOMY    . COLONOSCOPY N/A 09/22/2017   Procedure: COLONOSCOPY;  Surgeon: Lin Landsman, MD;  Location: [facility];  Service: Endoscopy;  Laterality: N/A;  . cyst removed  from left hand Left 1989  . DIALYSIS/PERMA CATHETER INSERTION N/A 05/20/2017   Procedure: Dialysis/Perma Catheter Insertion and fistulagram/LUE angiogram;  Surgeon: Algernon Huxley, MD;  Location: [facility];  Service: Cardiovascular;  Laterality: N/A;  . ESOPHAGOGASTRODUODENOSCOPY N/A 09/22/2017   Procedure: ESOPHAGOGASTRODUODENOSCOPY (EGD);  Surgeon: Lin Landsman, MD;  Location: [facility];  Service: Endoscopy;  Laterality: N/A;  . INCISION AND DRAINAGE ABSCESS N/A 07/16/2016   Procedure: INCISION AND DRAINAGE ABSCESS;  Surgeon: Carloyn Manner, MD;  Location: [facility];  Service: ENT;  Laterality: N/A;  . PERIPHERAL VASCULAR CATHETERIZATION N/A 07/25/2015   Procedure: A/V Shuntogram/Fistulagram;  Surgeon: Algernon Huxley, MD;  Location: [facility];  Service: Cardiovascular;  Laterality: N/A;  . PERIPHERAL VASCULAR CATHETERIZATION Left 07/25/2015  Procedure: A/V Shunt Intervention;  Surgeon: Algernon Huxley, MD;  Location: [facility];  Service: Cardiovascular;  Laterality: Left;  . PERIPHERAL VASCULAR CATHETERIZATION Left 10/07/2015   Procedure: A/V Shuntogram/Fistulagram;  Surgeon: Algernon Huxley, MD;  Location: [facility];  Service: Cardiovascular;  Laterality: Left;  . PERIPHERAL VASCULAR CATHETERIZATION N/A 10/07/2015   Procedure: A/V Shunt Intervention;  Surgeon: Algernon Huxley, MD;  Location: [facility];  Service: Cardiovascular;  Laterality: N/A;  . PERIPHERAL VASCULAR CATHETERIZATION  10/07/2015   Procedure: Dialysis/Perma Catheter Insertion;  Surgeon: Algernon Huxley, MD;  Location: [facility];  Service: Cardiovascular;;  . PERIPHERAL VASCULAR CATHETERIZATION N/A 12/17/2015   Procedure: Dialysis/Perma Catheter Removal;  Surgeon: Katha Cabal, MD;  Location: [facility];  Service: Cardiovascular;  Laterality: N/A;  . PERIPHERAL VASCULAR CATHETERIZATION N/A 01/11/2017   Procedure: Visceral Angiography;  Surgeon: Algernon Huxley, MD;  Location: [facility];  Service: Cardiovascular;  Laterality: N/A;  . PERIPHERAL VASCULAR CATHETERIZATION N/A 01/11/2017   Procedure: Visceral Artery Intervention;  Surgeon: Algernon Huxley, MD;  Location: [facility];  Service: Cardiovascular;  Laterality: N/A;  . POLYPECTOMY  09/22/2017   Procedure: POLYPECTOMY;  Surgeon: Lin Landsman, MD;  Location: [facility];  Service: Endoscopy;;  . rt. tubal and ovary removed    . STENT PLACEMENT VASCULAR ([place] HX) Left 03/2018   Performed at [facility] using EV3 protg GPS stent graph [id number] lot [id number]  . TONSILLECTOMY    . TUBAL LIGATION       Current Outpatient Medications:  .  Adalimumab (HUMIRA PEN [place]), Inject into the skin every 14 (fourteen) days., Disp: , Rfl:  .  calcium acetate (PHOSLO) 667 MG capsule, Take 1,334 mg by mouth 3 (three) times daily with meals. , Disp: , Rfl:  .  clobetasol (OLUX) 0.05 % topical foam, Apply topically 2 (two) times daily., Disp: , Rfl:  .  clobetasol ointment (TEMOVATE) 5.88 %, Apply 1 application topically daily as needed (for psorasis). , Disp: , Rfl:  .  diltiazem (CARDIZEM) 120 MG tablet, TK 2 TS PO BID, Disp: , Rfl: 4 .  glucose blood (TRUE METRIX BLOOD GLUCOSE TEST) test strip, Use as instructed, Disp: 100 each, Rfl: 12 .  insulin aspart (NOVOLOG) 100 UNIT/ML injection, Inject 1-2 Units into the skin 3 (three) times daily before meals., Disp: 10 mL, Rfl: 11 .  insulin glargine (LANTUS) 100 UNIT/ML injection, Inject 15 Units into the skin daily. , Disp: , Rfl:  .  Insulin Syringe 27G X 1/2" 1 ML MISC, 1 Syringe by Does not apply route 4 (four) times daily., Disp: 120 each, Rfl: 4 .  lactulose (CHRONULAC) 10 GM/15ML solution, TK 30 ML PO BID PRF  CONSTIPATION, Disp: , Rfl: 1 .  levocetirizine (XYZAL) 5 MG tablet, TAKE 1 TABLET(5 MG) BY MOUTH EVERY EVENING, Disp: 30 tablet, Rfl: 11 .  loperamide (IMODIUM A-D) 2 MG tablet, Take 2 mg by mouth 3 (three) times daily as needed. , Disp: , Rfl:  .  ondansetron (ZOFRAN) 8 MG tablet, Take by mouth., Disp: , Rfl:  .  sevelamer carbonate (RENVELA) 800 MG tablet, Take 1,600 mg by mouth 3 (three) times daily with meals., Disp: , Rfl:  .  torsemide (DEMADEX) 100 MG tablet, Take 100 mg by mouth daily., Disp: , Rfl:  .  VOLTAREN 1 % GEL, APPLY 4 GRAMS TOPICALLY QID, Disp: , Rfl: 2 .  diltiazem (TIAZAC) 120 MG 24 hr capsule, Take 240 mg by mouth 2 (two) times daily. , Disp: , Rfl:  .  midodrine (PROAMATINE) 5 MG tablet, TK 1 T PO  DURING DIALYSIS TREATMENT FOR LOW BP, Disp: , Rfl: 5 .  Na Sulfate-K Sulfate-Mg Sulf 17.5-3.13-1.6 GM/177ML SOLN, Take 1 Bottle by mouth once for 1 dose., Disp: 1 Bottle, Rfl: 0   Family History  Problem Relation Age of Onset  . Hypertension Mother   . Heart disease Mother   . Hypertension Father   . Skin cancer Father      Social History   Tobacco Use  . Smoking status: Light Tobacco Smoker    Packs/day: 0.25    Years: 20.00    Pack years: 5.00    Types: Cigarettes    Last attempt to quit: 05/21/2017    Years since quitting: 1.0  . Smokeless tobacco: Never Used  Substance Use Topics  . Alcohol use: No  . Drug use: No    Allergies as of 06/17/2018 - Review Complete 06/17/2018  Allergen Reaction Noted  . Cinnamon Anaphylaxis 10/11/2015  . Garlic Anaphylaxis and Hives 11/28/2014  . Onion Hives and Swelling 11/28/2014  . Tylenol [acetaminophen] Anaphylaxis 07/25/2015  . Ciprofloxacin Diarrhea 11/05/2016  . Prednisone Other (See Comments) 01/05/2017    Review of Systems:    All systems reviewed and negative except where noted in HPI.   Physical Exam:  BP (!) 142/70 (BP Location: Left Arm, Patient Position: Sitting, Cuff Size: Large)   Pulse 77   Resp 17    Ht '5\' 7"'$  (1.702 m)   Wt 220 lb 12.8 oz (100.2 kg)   BMI 34.58 kg/m  No LMP recorded. Patient is postmenopausal.  General:   Alert,  Well-developed, well-nourished, pleasant and cooperative in NAD Head:  Normocephalic and atraumatic. Eyes:  Sclera clear, no icterus.   Conjunctiva pink. Ears:  Normal auditory acuity. Nose:  No deformity, discharge, or lesions. Mouth:  No deformity or lesions,oropharynx pink & moist. Neck:  Supple; no masses or thyromegaly. Lungs:  Respirations even and unlabored.  Clear throughout to auscultation.   No wheezes, crackles, or rhonchi. No acute distress. Heart:  Regular rate and rhythm; no murmurs, clicks, rubs, or gallops. Abdomen:  Normal bowel sounds.  Abdominal obesity, significantly redundant skin, abdominal striae present, no appreciable ascites on exam.  Soft, non-tender and non-distended without masses, hepatosplenomegaly or hernias noted.  No guarding or rebound tenderness.   Rectal: Nor performed Msk:  Symmetrical without gross deformities. Good, equal movement & strength bilaterally. Pulses:  Normal pulses noted. Extremities:  No clubbing or edema.  No cyanosis, left upper arm AV fistula, bruit palpable. Neurologic:  Alert and oriented x3;  grossly normal neurologically. Skin:  Psoriatic rash on her right elbow, fingers, No jaundice. Lymph Nodes:  No significant cervical adenopathy. Psych:  Alert and cooperative. Normal mood and affect.  Imaging Studies:  Reviewed  Assessment and Plan:   [name] is a 52 y.o. White  female with  ESRD on hemodialysis, diabetes on insulin, chronic hepatitis C, genotype 1a, s/p treatment with Mavyret x12weeks finished in March 2019, cirrhosis, well compensated. She has alternating episodes of diarrhea and constipation. Colonic mucosa appeared normal but random colon biopsies revealed mild focal active colitis on colonoscopy in 09/2017. Differentials include early IBD or nonspecific or secondary to bowel  prep - Her ESR was significantly elevated - Fecal lactoferrin normal, she did not get the rest of the stool studies  that I ordered as she is constipated  Constipation: - she is requesting bowel prep. Ordered - I will start her on plecanatide once daily after bowel evacuation - she also gained weight, will check thyroid function studies  Advanced fibrosis/early cirrhosis: Secondary to chronic hepatitis C Fibrosure from 2015 suggests F3, she does have mild splenomegaly, thrombocytopenia, hypoalbuminemia, hyperbilirubinemia CPT score 6, class A Completed secondary liver disease workup and negative for ANA, Antimitochondrial antibodies, anti-smooth muscle antibodies. Ferritin normal. She is immune to hepatitis A and B. Alpha-1 antitrypsin levels are normal - EGD for variceal screening, up to date 09/22/2017, no evidence of varices. Repeat EGD in 2020 - No evidence of volume overload, recommend 2 g sodium diet - No evidence of encephalopathy - She does have mild normocytic anemia and thrombocytopenia. No coagulopathy. Ferritin normal - HCC screening - alpha-fetoprotein normal, ultrasound liver 09/28/2017, no liver lesions. Repeat ultrasound and AFP in 04/2018 unremarkable for liver lesions  Chronic hepatitis C, genotype 1a, viral load 255,801 units in 08/11/2016: status post Mavyret for 12 weeks, finished treatment in March 2019, negative cryoglobulin levels - She is immune to hepatitis A and B. Her hep B surface antigen nonreactive, hep B surface antibody reactive, hep B core antibody nonreactive, hepatitis A IgG reactive in 08/11/2016. Hepatitis E antigen and antibody negative - Recheck HCV viral load in 12 weeks after cessation of treatment  Large hemorrhoids -currently no symptoms Hemorrhoid banding in future if needed  Health maintenance: Immune to hepatitis A and B vaccine Received Pneumovax on 09/2012 Recommend annual influenza vaccine Recommend zoster vaccine Colon cancer screening:  Colonoscopy on 09/22/2017 revealed tubular adenomas, size greater than 10 mm. Repeat colonoscopy in 09/2020 Check Vit D levels in next visit   Follow up in 2-3 months  Olivia Darby, MD

## 2018-06-19 DIAGNOSIS — N186 End stage renal disease: Secondary | ICD-10-CM | POA: Diagnosis not present

## 2018-06-19 DIAGNOSIS — Z992 Dependence on renal dialysis: Secondary | ICD-10-CM | POA: Diagnosis not present

## 2018-06-20 DIAGNOSIS — D631 Anemia in chronic kidney disease: Secondary | ICD-10-CM | POA: Diagnosis not present

## 2018-06-20 DIAGNOSIS — N2581 Secondary hyperparathyroidism of renal origin: Secondary | ICD-10-CM | POA: Diagnosis not present

## 2018-06-20 DIAGNOSIS — Z992 Dependence on renal dialysis: Secondary | ICD-10-CM | POA: Diagnosis not present

## 2018-06-20 DIAGNOSIS — D509 Iron deficiency anemia, unspecified: Secondary | ICD-10-CM | POA: Diagnosis not present

## 2018-06-20 DIAGNOSIS — N186 End stage renal disease: Secondary | ICD-10-CM | POA: Diagnosis not present

## 2018-06-20 NOTE — Addendum Note (Signed)
Addended by: Valora Corporal on: 06/20/2018 12:41 PM   Modules accepted: Orders

## 2018-06-20 NOTE — Telephone Encounter (Signed)
Patient rescheduled for monitor placement.

## 2018-06-21 ENCOUNTER — Ambulatory Visit: Payer: Medicare Other | Admitting: Gastroenterology

## 2018-06-22 DIAGNOSIS — Z992 Dependence on renal dialysis: Secondary | ICD-10-CM | POA: Diagnosis not present

## 2018-06-22 DIAGNOSIS — N186 End stage renal disease: Secondary | ICD-10-CM | POA: Diagnosis not present

## 2018-06-22 DIAGNOSIS — D631 Anemia in chronic kidney disease: Secondary | ICD-10-CM | POA: Diagnosis not present

## 2018-06-22 DIAGNOSIS — D509 Iron deficiency anemia, unspecified: Secondary | ICD-10-CM | POA: Diagnosis not present

## 2018-06-22 DIAGNOSIS — N2581 Secondary hyperparathyroidism of renal origin: Secondary | ICD-10-CM | POA: Diagnosis not present

## 2018-06-24 DIAGNOSIS — N2581 Secondary hyperparathyroidism of renal origin: Secondary | ICD-10-CM | POA: Diagnosis not present

## 2018-06-24 DIAGNOSIS — D509 Iron deficiency anemia, unspecified: Secondary | ICD-10-CM | POA: Diagnosis not present

## 2018-06-24 DIAGNOSIS — Z992 Dependence on renal dialysis: Secondary | ICD-10-CM | POA: Diagnosis not present

## 2018-06-24 DIAGNOSIS — N186 End stage renal disease: Secondary | ICD-10-CM | POA: Diagnosis not present

## 2018-06-24 DIAGNOSIS — D631 Anemia in chronic kidney disease: Secondary | ICD-10-CM | POA: Diagnosis not present

## 2018-06-27 ENCOUNTER — Ambulatory Visit (INDEPENDENT_AMBULATORY_CARE_PROVIDER_SITE_OTHER): Payer: Medicare Other

## 2018-06-27 DIAGNOSIS — N2581 Secondary hyperparathyroidism of renal origin: Secondary | ICD-10-CM | POA: Diagnosis not present

## 2018-06-27 DIAGNOSIS — D631 Anemia in chronic kidney disease: Secondary | ICD-10-CM | POA: Diagnosis not present

## 2018-06-27 DIAGNOSIS — R002 Palpitations: Secondary | ICD-10-CM

## 2018-06-27 DIAGNOSIS — Z992 Dependence on renal dialysis: Secondary | ICD-10-CM | POA: Diagnosis not present

## 2018-06-27 DIAGNOSIS — N186 End stage renal disease: Secondary | ICD-10-CM | POA: Diagnosis not present

## 2018-06-27 DIAGNOSIS — R5383 Other fatigue: Secondary | ICD-10-CM | POA: Diagnosis not present

## 2018-06-27 DIAGNOSIS — D509 Iron deficiency anemia, unspecified: Secondary | ICD-10-CM | POA: Diagnosis not present

## 2018-06-27 LAB — HEPATIC FUNCTION PANEL: ALK PHOS: 7.8 — AB (ref 25–125)

## 2018-06-27 LAB — CBC AND DIFFERENTIAL
HCT: 34 — AB (ref 36–46)
HEMOGLOBIN: 11.2 — AB (ref 12.0–16.0)

## 2018-06-27 LAB — BASIC METABOLIC PANEL: POTASSIUM: 5.4 — AB (ref 3.4–5.3)

## 2018-06-29 DIAGNOSIS — D509 Iron deficiency anemia, unspecified: Secondary | ICD-10-CM | POA: Diagnosis not present

## 2018-06-29 DIAGNOSIS — N2581 Secondary hyperparathyroidism of renal origin: Secondary | ICD-10-CM | POA: Diagnosis not present

## 2018-06-29 DIAGNOSIS — D631 Anemia in chronic kidney disease: Secondary | ICD-10-CM | POA: Diagnosis not present

## 2018-06-29 DIAGNOSIS — N186 End stage renal disease: Secondary | ICD-10-CM | POA: Diagnosis not present

## 2018-06-29 DIAGNOSIS — Z992 Dependence on renal dialysis: Secondary | ICD-10-CM | POA: Diagnosis not present

## 2018-06-30 ENCOUNTER — Other Ambulatory Visit: Payer: Self-pay

## 2018-06-30 ENCOUNTER — Ambulatory Visit (INDEPENDENT_AMBULATORY_CARE_PROVIDER_SITE_OTHER): Payer: Medicare Other | Admitting: Nurse Practitioner

## 2018-06-30 ENCOUNTER — Encounter: Payer: Self-pay | Admitting: Nurse Practitioner

## 2018-06-30 VITALS — BP 150/56 | HR 76 | Temp 98.4°F | Ht 67.0 in | Wt 218.2 lb

## 2018-06-30 DIAGNOSIS — E1165 Type 2 diabetes mellitus with hyperglycemia: Secondary | ICD-10-CM

## 2018-06-30 DIAGNOSIS — N186 End stage renal disease: Secondary | ICD-10-CM

## 2018-06-30 DIAGNOSIS — Z992 Dependence on renal dialysis: Secondary | ICD-10-CM | POA: Diagnosis not present

## 2018-06-30 DIAGNOSIS — IMO0002 Reserved for concepts with insufficient information to code with codable children: Secondary | ICD-10-CM

## 2018-06-30 DIAGNOSIS — E118 Type 2 diabetes mellitus with unspecified complications: Secondary | ICD-10-CM

## 2018-06-30 LAB — POCT GLYCOSYLATED HEMOGLOBIN (HGB A1C): Hemoglobin A1C: 7.4 % — AB (ref 4.0–5.6)

## 2018-06-30 MED ORDER — ATORVASTATIN CALCIUM 20 MG PO TABS: 20.0000 mg | ORAL_TABLET | Freq: Every day | ORAL | 3 refills | Status: DC

## 2018-06-30 NOTE — Progress Notes (Signed)
Subjective:    Patient ID: [name], female    DOB: 1966-03-05, 52 y.o.   MRN: [id number]  [name] is a 52 y.o. female presenting on 06/30/2018 for Diabetes   HPI Diabetes Pt presents today for follow up of Type 2 diabetes mellitus. She is checking CBG at home, but does not have her log today.  Reports hyperglycemia. - She is not currently symptomatic.  - She denies polydipsia, polyphagia, polyuria, headaches, diaphoresis, shakiness, chills, pain, numbness or tingling in extremities and changes in vision.   - Clinical course has been worsening. - She  reports no regular exercise routine. - Her diet is moderate in salt, moderate in fat, and moderate in carbohydrates. - Weight trend: stable  Recent Labs    07/12/17 12/27/17 1035 03/29/18 1042  HGBA1C 8.3 6.7* 7.0   Abdominal Bloating Patient continues having difficulty with abdominal bloating, diarrhea.  She is seeing GI, but sees no improvement in symptoms.     Social History   Tobacco Use  . Smoking status: Light Tobacco Smoker    Packs/day: 0.25    Years: 20.00    Pack years: 5.00    Types: Cigarettes    Last attempt to quit: 05/21/2017    Years since quitting: 1.1  . Smokeless tobacco: Never Used  Substance Use Topics  . Alcohol use: No  . Drug use: No    Review of Systems Per HPI unless specifically indicated above     Objective:    BP (!) 150/56 (BP Location: Right Arm, Patient Position: Sitting, Cuff Size: Large)   Pulse 76   Temp 98.4 F (36.9 C) (Oral)   Ht 5\' 7"  (1.702 m)   Wt 218 lb 3.2 oz (99 kg)   BMI 34.17 kg/m   Wt Readings from Last 3 Encounters:  06/30/18 218 lb 3.2 oz (99 kg)  06/17/18 220 lb 12.8 oz (100.2 kg)  05/31/18 216 lb 4 oz (98.1 kg)    Physical Exam  Constitutional: She is oriented to person, place, and time. She appears well-developed and well-nourished. No distress.  HENT:  Head: Normocephalic and atraumatic.  Cardiovascular: Normal rate, regular rhythm, normal  heart sounds and intact distal pulses.  Neurological: She is alert and oriented to person, place, and time.  Skin: Skin is warm and dry. Capillary refill takes less than 2 seconds.  Psychiatric: She has a normal mood and affect. Her behavior is normal.  Vitals reviewed.    Results for orders placed or performed during the hospital encounter of 04/07/18  CBC  Result Value Ref Range   WBC 6.9 3.6 - 11.0 K/uL   RBC 3.46 (L) 3.80 - 5.20 MIL/uL   Hemoglobin 11.7 (L) 12.0 - 16.0 g/dL   HCT 33.6 (L) 35.0 - 47.0 %   MCV 97.2 80.0 - 100.0 fL   MCH 33.8 26.0 - 34.0 pg   MCHC 34.8 32.0 - 36.0 g/dL   RDW 13.9 11.5 - 14.5 %   Platelets 193 150 - 440 K/uL  HCV RNA quant  Result Value Ref Range   HCV Quantitative HCV Not Detected >50 IU/mL   Test Information Comment   Protime-INR  Result Value Ref Range   Prothrombin Time 13.6 11.4 - 15.2 seconds   INR 1.05   Hepatic function panel  Result Value Ref Range   Total Protein 8.0 6.5 - 8.1 g/dL   Albumin 3.8 3.5 - 5.0 g/dL   AST 23 15 - 41 U/L  ALT 13 (L) 14 - 54 U/L   Alkaline Phosphatase 64 38 - 126 U/L   Total Bilirubin 0.5 0.3 - 1.2 mg/dL   Bilirubin, Direct <0.1 (L) 0.1 - 0.5 mg/dL   Indirect Bilirubin NOT CALCULATED 0.3 - 0.9 mg/dL  AFP tumor marker  Result Value Ref Range   AFP, Serum, Tumor Marker 1.5 0.0 - 8.3 ng/mL      Assessment & Plan:   Problem List Items Addressed This Visit      Endocrine   Diabetes mellitus type 2, uncontrolled, with complications ([place]) - Primary Worsening, but with dietary indiscretions.  Some diet changes uncontrollable as patient has some donations as part of her regular diet.   - Consider replacing protein bar with Nepro - Continue meds without changes today - focus on lifestyle. - Follow-up 3 months      Relevant Medications   atorvastatin (LIPITOR) 20 MG tablet   Other Relevant Orders   POCT glycosylated hemoglobin (Hb A1C) (Completed)     Genitourinary   ESRD (end stage renal disease)  on dialysis [facility]) Patient with ESRD, patient of Dr. [name].  Discussed low potassium diet needed.  Provided high potassium foods to avoid.  Continue following with Dr. [name]. Follow-up prn.      Meds ordered this encounter  Medications  . atorvastatin (LIPITOR) 20 MG tablet    Sig: Take 1 tablet (20 mg total) by mouth daily.    Dispense:  90 tablet    Refill:  3    Order Specific Question:   Supervising Provider    Answer:   Olin Hauser [[id number]]    Follow up plan: Return in about 3 months (around 09/30/2018).  Cassell Smiles, DNP, AGPCNP-BC Adult Gerontology Primary Care Nurse Practitioner [facility] 06/30/2018, 11:33 AM

## 2018-06-30 NOTE — Patient Instructions (Addendum)
[name],   Thank you for coming in to clinic today.  1. Consider replacing your protein bar with Nepro with carb steady  2. Continue discussion about your torsemide with Dr. [name]  3. The following foods are high in potassium: Sweet potato, White potato, Tomato sauce, Watermelon, Spinach, Beets, Black beans, White beans, Salmon, Edamame, Butternut squash, Swiss chard, Yogurt, banana (less than all of these other foods).    Please schedule a follow-up appointment with Cassell Smiles, AGNP. Return in about 3 months (around 09/30/2018).  If you have any other questions or concerns, please feel free to call the clinic or send a message through [place]. You may also schedule an earlier appointment if necessary.  You will receive a survey after today's visit either digitally by e-mail or paper by [employer]. Your experiences and feedback matter to [place].  Please respond so we know how we are doing as we provide care for you.   Cassell Smiles, DNP, AGNP-BC Adult Gerontology Nurse Practitioner [facility]

## 2018-07-01 DIAGNOSIS — N2581 Secondary hyperparathyroidism of renal origin: Secondary | ICD-10-CM | POA: Diagnosis not present

## 2018-07-01 DIAGNOSIS — D509 Iron deficiency anemia, unspecified: Secondary | ICD-10-CM | POA: Diagnosis not present

## 2018-07-01 DIAGNOSIS — D631 Anemia in chronic kidney disease: Secondary | ICD-10-CM | POA: Diagnosis not present

## 2018-07-01 DIAGNOSIS — Z992 Dependence on renal dialysis: Secondary | ICD-10-CM | POA: Diagnosis not present

## 2018-07-01 DIAGNOSIS — N186 End stage renal disease: Secondary | ICD-10-CM | POA: Diagnosis not present

## 2018-07-04 DIAGNOSIS — D509 Iron deficiency anemia, unspecified: Secondary | ICD-10-CM | POA: Diagnosis not present

## 2018-07-04 DIAGNOSIS — D631 Anemia in chronic kidney disease: Secondary | ICD-10-CM | POA: Diagnosis not present

## 2018-07-04 DIAGNOSIS — N2581 Secondary hyperparathyroidism of renal origin: Secondary | ICD-10-CM | POA: Diagnosis not present

## 2018-07-04 DIAGNOSIS — N186 End stage renal disease: Secondary | ICD-10-CM | POA: Diagnosis not present

## 2018-07-04 DIAGNOSIS — Z992 Dependence on renal dialysis: Secondary | ICD-10-CM | POA: Diagnosis not present

## 2018-07-06 ENCOUNTER — Telehealth: Payer: Self-pay | Admitting: Cardiovascular Disease

## 2018-07-06 ENCOUNTER — Encounter: Payer: Self-pay | Admitting: Physician Assistant

## 2018-07-06 DIAGNOSIS — N2581 Secondary hyperparathyroidism of renal origin: Secondary | ICD-10-CM | POA: Diagnosis not present

## 2018-07-06 DIAGNOSIS — N186 End stage renal disease: Secondary | ICD-10-CM | POA: Diagnosis not present

## 2018-07-06 DIAGNOSIS — D509 Iron deficiency anemia, unspecified: Secondary | ICD-10-CM | POA: Diagnosis not present

## 2018-07-06 DIAGNOSIS — D631 Anemia in chronic kidney disease: Secondary | ICD-10-CM | POA: Diagnosis not present

## 2018-07-06 DIAGNOSIS — Z992 Dependence on renal dialysis: Secondary | ICD-10-CM | POA: Diagnosis not present

## 2018-07-06 NOTE — Telephone Encounter (Signed)
No results are available for review at this time. If patient has felt well, we could move her appointment back 1 month (Dr. Fletcher Anon wanted follow up in 1-2 months, so that would still fall into that window). If she is still symptomatic she is welcome to keep the appointment if she would like.

## 2018-07-06 NOTE — Progress Notes (Signed)
Cardiology Office Note Date:  07/07/2018  Patient ID:  [name], [name] 28-Sep-1966, MRN [id number] PCP:  Mikey College, NP  Cardiologist:  Dr. Fletcher Anon, MD    Chief Complaint: Follow up  History of Present Illness: [name] is a 53 y.o. female with history of ESRD on HD for 3 years, COPD due to tobacco abuse, DM since 1989, HTN, migraine disorder, hepatitis C, HLD, PVD, anxiety, and depression who presents for follow up of dizziness and presyncope.   She had previously been followed by Dr. Ubaldo Glassing in 2017 for atypical chest pain and underwent an echo and stress test at that time that were both unrevealing with an EF of 50-55%. Patient was initially evaluated by Dr. Fletcher Anon on 05/31/2018 for dizziness and presyncope. At that time, she noted intermittent episodes of a heaviness in her head and shoulders followed by extreme dizziness. She was concerned her BP may be running low during these episodes, but had not checked it. There was also mention of increased palpitations. She denied any chest pain, though did note worsening exertional dyspnea. These episodes were not exclusively associated with her HD sessions. EKG showed NSR, left atrial enlargement, and LVH. She was not orthostatic. She was advised to bring in her BP readings from home at her next follow up. Initial Zio monitor showed the patient only wore the monitor for 1 day and 13 hours and did not show any significant arrhythmia. Given she reported ongoing symptoms it was replaced and is pending at this time (not in Epic for preliminary review). Echo on 06/09/2018 showed an EF of 55-60%, no RWMA, Gr1DD, mild AI, mild to moderate MR, moderately dilated LA, mild to moderate TR, PASP 36 mmHg.   Patient comes in today feeling about the same as when she was last seen.  She indicates she has a long history of chronic diarrhea with multiple watery bowel movements daily.  She indicates after she takes a long, hot shower or has profuse watery  diarrhea and she is orthostatic, dizzy, and feels flushed.  No chest pain or shortness of breath.  Blood pressure readings at home have ranged from the 1 teens to 092Z systolic.  She has been self titrating her short acting diltiazem 120 mg.  If her blood pressure is 300 or higher systolic she is taking 2 diltiazems leading to significant fluctuations in her blood pressure throughout the remainder of the day.  Blood pressure this morning prior to coming to the clinic was 762 systolic.  She indicates while she was wearing the heart monitor she did not have any of these presyncopal/dizzy episodes.  She did report palpitations.  Results continue to be pending at this time.  Currently feels well.  Past Medical History:  Diagnosis Date  . Allergy   . Anemia   . Anxiety    worse when not at home - bowel incontinence  . Arthritis   . Ataxia   . COPD (chronic obstructive pulmonary disease) [place]   . Depression   . Diabetes mellitus with complication ([place])   . ESRD on hemodialysis ([place])    MWF dialysis  . Fistula    left upper arm  . Hepatitis 2003   Hep C  . Hiatal hernia   . Hypercholesterolemia   . Hypertension   . Migraine   . Neuropathy, diabetic ([facility])    lower legs  . Peripheral vascular disease ([place])   . Pneumonia 2015  . Psoriasis   . Tobacco dependence   .  Wears dentures    full upper, partial lower    Past Surgical History:  Procedure Laterality Date  . A/V SHUNT INTERVENTION N/A 12/16/2017   Procedure: A/V SHUNT INTERVENTION;  Surgeon: Algernon Huxley, MD;  Location: [facility];  Service: Cardiovascular;  Laterality: N/A;  . AV FISTULA PLACEMENT Left 11/2014  . CESAREAN SECTION    . CHOLECYSTECTOMY    . COLONOSCOPY N/A 09/22/2017   Procedure: COLONOSCOPY;  Surgeon: Lin Landsman, MD;  Location: [facility];  Service: Endoscopy;  Laterality: N/A;  . cyst removed  from left hand Left 1989  . DIALYSIS/PERMA CATHETER INSERTION N/A 05/20/2017   Procedure:  Dialysis/Perma Catheter Insertion and fistulagram/LUE angiogram;  Surgeon: Algernon Huxley, MD;  Location: [facility];  Service: Cardiovascular;  Laterality: N/A;  . ESOPHAGOGASTRODUODENOSCOPY N/A 09/22/2017   Procedure: ESOPHAGOGASTRODUODENOSCOPY (EGD);  Surgeon: Lin Landsman, MD;  Location: [facility];  Service: Endoscopy;  Laterality: N/A;  . INCISION AND DRAINAGE ABSCESS N/A 07/16/2016   Procedure: INCISION AND DRAINAGE ABSCESS;  Surgeon: Carloyn Manner, MD;  Location: [facility];  Service: ENT;  Laterality: N/A;  . PERIPHERAL VASCULAR CATHETERIZATION N/A 07/25/2015   Procedure: A/V Shuntogram/Fistulagram;  Surgeon: Algernon Huxley, MD;  Location: [facility];  Service: Cardiovascular;  Laterality: N/A;  . PERIPHERAL VASCULAR CATHETERIZATION Left 07/25/2015   Procedure: A/V Shunt Intervention;  Surgeon: Algernon Huxley, MD;  Location: [facility];  Service: Cardiovascular;  Laterality: Left;  . PERIPHERAL VASCULAR CATHETERIZATION Left 10/07/2015   Procedure: A/V Shuntogram/Fistulagram;  Surgeon: Algernon Huxley, MD;  Location: [facility];  Service: Cardiovascular;  Laterality: Left;  . PERIPHERAL VASCULAR CATHETERIZATION N/A 10/07/2015   Procedure: A/V Shunt Intervention;  Surgeon: Algernon Huxley, MD;  Location: [facility];  Service: Cardiovascular;  Laterality: N/A;  . PERIPHERAL VASCULAR CATHETERIZATION  10/07/2015   Procedure: Dialysis/Perma Catheter Insertion;  Surgeon: Algernon Huxley, MD;  Location: [facility];  Service: Cardiovascular;;  . PERIPHERAL VASCULAR CATHETERIZATION N/A 12/17/2015   Procedure: Dialysis/Perma Catheter Removal;  Surgeon: Katha Cabal, MD;  Location: [facility];  Service: Cardiovascular;  Laterality: N/A;  . PERIPHERAL VASCULAR CATHETERIZATION N/A 01/11/2017   Procedure: Visceral Angiography;  Surgeon: Algernon Huxley, MD;  Location: [facility];  Service: Cardiovascular;  Laterality: N/A;  .  PERIPHERAL VASCULAR CATHETERIZATION N/A 01/11/2017   Procedure: Visceral Artery Intervention;  Surgeon: Algernon Huxley, MD;  Location: [facility];  Service: Cardiovascular;  Laterality: N/A;  . POLYPECTOMY  09/22/2017   Procedure: POLYPECTOMY;  Surgeon: Lin Landsman, MD;  Location: [facility];  Service: Endoscopy;;  . rt. tubal and ovary removed    . STENT PLACEMENT VASCULAR ([facility]) Left 03/2018   Performed at [facility] using EV3 protg GPS stent graph [id number] lot [id number]  . TONSILLECTOMY    . TUBAL LIGATION      Current Meds  Medication Sig  . Adalimumab (HUMIRA PEN ) Inject into the skin every 14 (fourteen) days.  Marland Kitchen atorvastatin (LIPITOR) 20 MG tablet Take 1 tablet (20 mg total) by mouth daily.  . calcium acetate (PHOSLO) 667 MG capsule Take 1,334 mg by mouth 3 (three) times daily with meals.   . clobetasol (OLUX) 0.05 % topical foam Apply topically 2 (two) times daily.  . clobetasol ointment (TEMOVATE) 2.20 % Apply 1 application topically daily as needed (for psorasis).   Marland Kitchen glucose blood (  TRUE METRIX BLOOD GLUCOSE TEST) test strip Use as instructed  . insulin aspart (NOVOLOG) 100 UNIT/ML injection Inject 1-2 Units into the skin 3 (three) times daily before meals.  . insulin glargine (LANTUS) 100 UNIT/ML injection Inject 15 Units into the skin daily.   . Insulin Syringe 27G X 1/2" 1 ML MISC 1 Syringe by Does not apply route 4 (four) times daily.  Marland Kitchen levocetirizine (XYZAL) 5 MG tablet TAKE 1 TABLET(5 MG) BY MOUTH EVERY EVENING  . loperamide (IMODIUM A-D) 2 MG tablet Take 2 mg by mouth 3 (three) times daily as needed.   . midodrine (PROAMATINE) 5 MG tablet TK 1 T PO  DURING DIALYSIS TREATMENT FOR LOW BP  . ondansetron (ZOFRAN) 8 MG tablet Take by mouth.  . sevelamer carbonate (RENVELA) 800 MG tablet Take 1,600 mg by mouth 3 (three) times daily with meals.  . torsemide (DEMADEX) 100 MG tablet Take 100 mg by mouth daily.  . VOLTAREN 1 %  GEL APPLY 4 GRAMS TOPICALLY QID  . [DISCONTINUED] diltiazem (CARDIZEM) 120 MG tablet Take 120 mg by mouth 2 (two) times daily.    Allergies:   Cinnamon; Garlic; Onion; Tylenol [acetaminophen]; Ciprofloxacin; and Prednisone   Social History:  The patient  reports that she has been smoking cigarettes.  She has a 5.00 pack-year smoking history. She has never used smokeless tobacco. She reports that she does not drink alcohol or use drugs.   Family History:  The patient's family history includes Heart disease in her mother; Hypertension in her father and mother; Skin cancer in her father.  ROS:   Review of Systems  Constitutional: Positive for malaise/fatigue. Negative for chills, diaphoresis, fever and weight loss.  HENT: Negative for congestion.   Eyes: Negative for discharge and redness.  Respiratory: Negative for cough, hemoptysis, sputum production, shortness of breath and wheezing.   Cardiovascular: Negative for chest pain, palpitations, orthopnea, claudication, leg swelling and PND.  Gastrointestinal: Positive for diarrhea. Negative for abdominal pain, blood in stool, constipation, heartburn, melena, nausea and vomiting.  Genitourinary: Negative for hematuria.  Musculoskeletal: Negative for falls and myalgias.  Skin: Negative for rash.  Neurological: Positive for dizziness and weakness. Negative for tingling, tremors, sensory change, speech change, focal weakness and loss of consciousness.  Endo/Heme/Allergies: Does not bruise/bleed easily.  Psychiatric/Behavioral: Negative for substance abuse. The patient is nervous/anxious.   All other systems reviewed and are negative.    PHYSICAL EXAM:  VS:  BP 130/60 (BP Location: Right Arm, Patient Position: Sitting, Cuff Size: Normal)   Pulse 78   Ht 5\' 7"  (1.702 m)   Wt 220 lb 12 oz (100.1 kg)   BMI 34.57 kg/m  BMI: Body mass index is 34.57 kg/m.  Physical Exam  Constitutional: She is oriented to person, place, and time. She appears  well-developed and well-nourished.  HENT:  Head: Normocephalic and atraumatic.  Eyes: Right eye exhibits no discharge. Left eye exhibits no discharge.  Neck: Normal range of motion. No JVD present.  Cardiovascular: Normal rate, regular rhythm, S1 normal and S2 normal. Exam reveals no distant heart sounds, no friction rub, no midsystolic click and no opening snap.  Murmur heard. High-pitched blowing holosystolic murmur is present with a grade of 1/6 at the apex. Pulses:      Posterior tibial pulses are 2+ on the right side, and 2+ on the left side.  Pulmonary/Chest: Effort normal and breath sounds normal. No respiratory distress. She has no decreased breath sounds. She has no wheezes. She has  no rales. She exhibits no tenderness.  Abdominal: Soft. She exhibits no distension. There is no tenderness.  Musculoskeletal: She exhibits no edema.  Neurological: She is alert and oriented to person, place, and time.  Skin: Skin is warm and dry. No cyanosis. Nails show no clubbing.  Psychiatric: She has a normal mood and affect. Her speech is normal and behavior is normal. Judgment and thought content normal.     EKG:  Was ordered and interpreted by me today. Shows NSR, 78, LVH, no acute st/t changes  Recent Labs: 12/14/2017: Magnesium 2.4 12/27/2017: BUN 75; Sodium 133 04/07/2018: ALT 13; Platelets 193 06/13/2018: Creatinine 7.0 06/27/2018: Hemoglobin 11.2; Potassium 5.4  No results found for requested labs within last 8760 hours.   CrCl cannot be calculated (Patient's most recent lab result is older than the maximum 21 days allowed.).   Wt Readings from Last 3 Encounters:  07/07/18 220 lb 12 oz (100.1 kg)  06/30/18 218 lb 3.2 oz (99 kg)  06/17/18 220 lb 12.8 oz (100.2 kg)     Other studies reviewed: Additional studies/records reviewed today include: summarized above  ASSESSMENT AND PLAN:  1. Dizziness/presyncope: Outpatient cardiac monitoring pending.  Recent echocardiogram unrevealing.   Symptoms appear to be associated with significant fluctuations in her blood pressure likely exacerbated by her hemodialysis and chronic, watery diarrhea with possible dumping syndrome.  We will change her diltiazem from 120 mg twice daily (however patient was self titrating medication and occasionally taking 240 mg in the morning followed by 120 mg in the evening likely exacerbating her significant blood pressure fluctuations) to diltiazem 60 mg 3 times daily.  She will not take her diltiazem on the mornings of hemodialysis to allow for more permissive blood pressure into the 060R to 561B systolic.  She has hold parameters for less than 379 systolic.  Continue to check blood pressure readings at home and bring to next office visit.  2. HTN: Blood pressure is reasonably controlled today in the office at 130/60.  Change diltiazem as above.  3. Dyspnea: Resolved.  Recent echocardiogram as above.  4. Mitral regurgitation: Periodic evaluation with echocardiogram.  5. ESRD: Managed by nephrology.  Would allow for more permissive blood pressure on hemodialysis days.  6. Chronic diarrhea: Per PCP/GI.  Likely complicating her blood pressure fluctuations as above.  Disposition: F/u with Dr. Fletcher Anon or APP in 3 months.  Current medicines are reviewed at length with the patient today.  The patient did not have any concerns regarding medicines.  Signed, Christell Faith, PA-C 07/07/2018 8:32 AM     [facility] [address] [place] [address] [place] [place], [place] [address] [phone]

## 2018-07-06 NOTE — Telephone Encounter (Signed)
Called patient to confirm 1 month follow up for ZIO results Patient just returned monitor on 7/12, would like to know if results are in to discuss or if appointment should be rescheduled Patient also has questions regarding blood pressure medication  Please call to discuss

## 2018-07-06 NOTE — Telephone Encounter (Signed)
Spoke with pt, she will keep her appointment tomorrow as she is still having problems.

## 2018-07-06 NOTE — Telephone Encounter (Signed)
Patient is concerned that results for monitor will not be resulted in time for her appointment tomorrow. Patient mailed in monitor on Friday. No results at this time, but will see if Christell Faith PA wants patient to keep appointment for tomorrow.

## 2018-07-07 ENCOUNTER — Ambulatory Visit (INDEPENDENT_AMBULATORY_CARE_PROVIDER_SITE_OTHER): Payer: Medicare Other | Admitting: Physician Assistant

## 2018-07-07 ENCOUNTER — Encounter: Payer: Self-pay | Admitting: Physician Assistant

## 2018-07-07 VITALS — BP 130/60 | HR 78 | Ht 67.0 in | Wt 220.8 lb

## 2018-07-07 DIAGNOSIS — R55 Syncope and collapse: Secondary | ICD-10-CM

## 2018-07-07 DIAGNOSIS — N186 End stage renal disease: Secondary | ICD-10-CM

## 2018-07-07 DIAGNOSIS — I10 Essential (primary) hypertension: Secondary | ICD-10-CM | POA: Diagnosis not present

## 2018-07-07 DIAGNOSIS — Z992 Dependence on renal dialysis: Secondary | ICD-10-CM | POA: Diagnosis not present

## 2018-07-07 DIAGNOSIS — I340 Nonrheumatic mitral (valve) insufficiency: Secondary | ICD-10-CM

## 2018-07-07 DIAGNOSIS — R002 Palpitations: Secondary | ICD-10-CM | POA: Diagnosis not present

## 2018-07-07 DIAGNOSIS — K529 Noninfective gastroenteritis and colitis, unspecified: Secondary | ICD-10-CM | POA: Diagnosis not present

## 2018-07-07 MED ORDER — DILTIAZEM HCL 60 MG PO TABS: 60.0000 mg | ORAL_TABLET | Freq: Three times a day (TID) | ORAL | 3 refills | Status: DC

## 2018-07-07 NOTE — Patient Instructions (Signed)
Medication Instructions:  Your physician has recommended you make the following change in your medication:  1. CHANGED Diltiazem to 60 mg three times daily. Hold the morning of your hemodialysis and also hold if Systolic blood pressure is less than 120.    Follow-Up: Your physician recommends that you schedule a follow-up appointment in: 3 months with Dr. Fletcher Anon or Christell Faith PA.  It was a pleasure seeing you today here in the office. Please do not hesitate to give [place] a call back if you have any further questions. [place], BSN

## 2018-07-08 DIAGNOSIS — D631 Anemia in chronic kidney disease: Secondary | ICD-10-CM | POA: Diagnosis not present

## 2018-07-08 DIAGNOSIS — Z992 Dependence on renal dialysis: Secondary | ICD-10-CM | POA: Diagnosis not present

## 2018-07-08 DIAGNOSIS — R002 Palpitations: Secondary | ICD-10-CM | POA: Diagnosis not present

## 2018-07-08 DIAGNOSIS — N2581 Secondary hyperparathyroidism of renal origin: Secondary | ICD-10-CM | POA: Diagnosis not present

## 2018-07-08 DIAGNOSIS — D509 Iron deficiency anemia, unspecified: Secondary | ICD-10-CM | POA: Diagnosis not present

## 2018-07-08 DIAGNOSIS — N186 End stage renal disease: Secondary | ICD-10-CM | POA: Diagnosis not present

## 2018-07-11 DIAGNOSIS — E119 Type 2 diabetes mellitus without complications: Secondary | ICD-10-CM | POA: Diagnosis not present

## 2018-07-11 DIAGNOSIS — N186 End stage renal disease: Secondary | ICD-10-CM | POA: Diagnosis not present

## 2018-07-11 DIAGNOSIS — D631 Anemia in chronic kidney disease: Secondary | ICD-10-CM | POA: Diagnosis not present

## 2018-07-11 DIAGNOSIS — Z794 Long term (current) use of insulin: Secondary | ICD-10-CM | POA: Diagnosis not present

## 2018-07-11 DIAGNOSIS — Z992 Dependence on renal dialysis: Secondary | ICD-10-CM | POA: Diagnosis not present

## 2018-07-11 DIAGNOSIS — D509 Iron deficiency anemia, unspecified: Secondary | ICD-10-CM | POA: Diagnosis not present

## 2018-07-11 DIAGNOSIS — N2581 Secondary hyperparathyroidism of renal origin: Secondary | ICD-10-CM | POA: Diagnosis not present

## 2018-07-13 DIAGNOSIS — B351 Tinea unguium: Secondary | ICD-10-CM | POA: Diagnosis not present

## 2018-07-13 DIAGNOSIS — D509 Iron deficiency anemia, unspecified: Secondary | ICD-10-CM | POA: Diagnosis not present

## 2018-07-13 DIAGNOSIS — Z794 Long term (current) use of insulin: Secondary | ICD-10-CM | POA: Diagnosis not present

## 2018-07-13 DIAGNOSIS — L851 Acquired keratosis [keratoderma] palmaris et plantaris: Secondary | ICD-10-CM | POA: Diagnosis not present

## 2018-07-13 DIAGNOSIS — N2581 Secondary hyperparathyroidism of renal origin: Secondary | ICD-10-CM | POA: Diagnosis not present

## 2018-07-13 DIAGNOSIS — Z992 Dependence on renal dialysis: Secondary | ICD-10-CM | POA: Diagnosis not present

## 2018-07-13 DIAGNOSIS — N186 End stage renal disease: Secondary | ICD-10-CM | POA: Diagnosis not present

## 2018-07-13 DIAGNOSIS — E1140 Type 2 diabetes mellitus with diabetic neuropathy, unspecified: Secondary | ICD-10-CM | POA: Diagnosis not present

## 2018-07-13 DIAGNOSIS — D631 Anemia in chronic kidney disease: Secondary | ICD-10-CM | POA: Diagnosis not present

## 2018-07-15 ENCOUNTER — Other Ambulatory Visit
Admission: RE | Admit: 2018-07-15 | Discharge: 2018-07-15 | Disposition: A | Discharge status: Home / Self Care | Payer: Medicare Other | Source: Ambulatory Visit | Attending: Nephrology | Admitting: Nephrology

## 2018-07-15 DIAGNOSIS — N186 End stage renal disease: Secondary | ICD-10-CM | POA: Diagnosis not present

## 2018-07-15 DIAGNOSIS — D631 Anemia in chronic kidney disease: Secondary | ICD-10-CM | POA: Diagnosis not present

## 2018-07-15 DIAGNOSIS — E1122 Type 2 diabetes mellitus with diabetic chronic kidney disease: Secondary | ICD-10-CM | POA: Insufficient documentation

## 2018-07-15 DIAGNOSIS — D509 Iron deficiency anemia, unspecified: Secondary | ICD-10-CM | POA: Diagnosis not present

## 2018-07-15 DIAGNOSIS — N2581 Secondary hyperparathyroidism of renal origin: Secondary | ICD-10-CM | POA: Diagnosis not present

## 2018-07-15 DIAGNOSIS — Z992 Dependence on renal dialysis: Secondary | ICD-10-CM | POA: Insufficient documentation

## 2018-07-15 LAB — POTASSIUM: POTASSIUM: 5.2 mmol/L — AB (ref 3.5–5.1)

## 2018-07-18 DIAGNOSIS — N2581 Secondary hyperparathyroidism of renal origin: Secondary | ICD-10-CM | POA: Diagnosis not present

## 2018-07-18 DIAGNOSIS — D631 Anemia in chronic kidney disease: Secondary | ICD-10-CM | POA: Diagnosis not present

## 2018-07-18 DIAGNOSIS — Z992 Dependence on renal dialysis: Secondary | ICD-10-CM | POA: Diagnosis not present

## 2018-07-18 DIAGNOSIS — N186 End stage renal disease: Secondary | ICD-10-CM | POA: Diagnosis not present

## 2018-07-18 DIAGNOSIS — D509 Iron deficiency anemia, unspecified: Secondary | ICD-10-CM | POA: Diagnosis not present

## 2018-07-20 DIAGNOSIS — N186 End stage renal disease: Secondary | ICD-10-CM | POA: Diagnosis not present

## 2018-07-20 DIAGNOSIS — Z992 Dependence on renal dialysis: Secondary | ICD-10-CM | POA: Diagnosis not present

## 2018-07-22 DIAGNOSIS — D509 Iron deficiency anemia, unspecified: Secondary | ICD-10-CM | POA: Diagnosis not present

## 2018-07-22 DIAGNOSIS — N186 End stage renal disease: Secondary | ICD-10-CM | POA: Diagnosis not present

## 2018-07-22 DIAGNOSIS — N2581 Secondary hyperparathyroidism of renal origin: Secondary | ICD-10-CM | POA: Diagnosis not present

## 2018-07-22 DIAGNOSIS — Z992 Dependence on renal dialysis: Secondary | ICD-10-CM | POA: Diagnosis not present

## 2018-07-22 DIAGNOSIS — D631 Anemia in chronic kidney disease: Secondary | ICD-10-CM | POA: Diagnosis not present

## 2018-07-25 DIAGNOSIS — D509 Iron deficiency anemia, unspecified: Secondary | ICD-10-CM | POA: Diagnosis not present

## 2018-07-25 DIAGNOSIS — N186 End stage renal disease: Secondary | ICD-10-CM | POA: Diagnosis not present

## 2018-07-25 DIAGNOSIS — N2581 Secondary hyperparathyroidism of renal origin: Secondary | ICD-10-CM | POA: Diagnosis not present

## 2018-07-25 DIAGNOSIS — D631 Anemia in chronic kidney disease: Secondary | ICD-10-CM | POA: Diagnosis not present

## 2018-07-25 DIAGNOSIS — Z992 Dependence on renal dialysis: Secondary | ICD-10-CM | POA: Diagnosis not present

## 2018-07-27 DIAGNOSIS — D509 Iron deficiency anemia, unspecified: Secondary | ICD-10-CM | POA: Diagnosis not present

## 2018-07-27 DIAGNOSIS — N2581 Secondary hyperparathyroidism of renal origin: Secondary | ICD-10-CM | POA: Diagnosis not present

## 2018-07-27 DIAGNOSIS — D631 Anemia in chronic kidney disease: Secondary | ICD-10-CM | POA: Diagnosis not present

## 2018-07-27 DIAGNOSIS — Z992 Dependence on renal dialysis: Secondary | ICD-10-CM | POA: Diagnosis not present

## 2018-07-27 DIAGNOSIS — N186 End stage renal disease: Secondary | ICD-10-CM | POA: Diagnosis not present

## 2018-07-29 DIAGNOSIS — Z992 Dependence on renal dialysis: Secondary | ICD-10-CM | POA: Diagnosis not present

## 2018-07-29 DIAGNOSIS — D631 Anemia in chronic kidney disease: Secondary | ICD-10-CM | POA: Diagnosis not present

## 2018-07-29 DIAGNOSIS — D509 Iron deficiency anemia, unspecified: Secondary | ICD-10-CM | POA: Diagnosis not present

## 2018-07-29 DIAGNOSIS — N2581 Secondary hyperparathyroidism of renal origin: Secondary | ICD-10-CM | POA: Diagnosis not present

## 2018-07-29 DIAGNOSIS — N186 End stage renal disease: Secondary | ICD-10-CM | POA: Diagnosis not present

## 2018-08-01 DIAGNOSIS — Z992 Dependence on renal dialysis: Secondary | ICD-10-CM | POA: Diagnosis not present

## 2018-08-01 DIAGNOSIS — N2581 Secondary hyperparathyroidism of renal origin: Secondary | ICD-10-CM | POA: Diagnosis not present

## 2018-08-01 DIAGNOSIS — D631 Anemia in chronic kidney disease: Secondary | ICD-10-CM | POA: Diagnosis not present

## 2018-08-01 DIAGNOSIS — D509 Iron deficiency anemia, unspecified: Secondary | ICD-10-CM | POA: Diagnosis not present

## 2018-08-01 DIAGNOSIS — N186 End stage renal disease: Secondary | ICD-10-CM | POA: Diagnosis not present

## 2018-08-02 ENCOUNTER — Encounter: Payer: Self-pay | Admitting: Gastroenterology

## 2018-08-02 ENCOUNTER — Ambulatory Visit (INDEPENDENT_AMBULATORY_CARE_PROVIDER_SITE_OTHER): Payer: Medicare Other | Admitting: Gastroenterology

## 2018-08-02 ENCOUNTER — Other Ambulatory Visit: Payer: Self-pay

## 2018-08-02 VITALS — BP 143/78 | HR 90 | Ht 67.0 in | Wt 221.8 lb

## 2018-08-02 DIAGNOSIS — K5901 Slow transit constipation: Secondary | ICD-10-CM | POA: Diagnosis not present

## 2018-08-02 DIAGNOSIS — B182 Chronic viral hepatitis C: Secondary | ICD-10-CM | POA: Diagnosis not present

## 2018-08-02 DIAGNOSIS — K7469 Other cirrhosis of liver: Secondary | ICD-10-CM | POA: Diagnosis not present

## 2018-08-02 NOTE — Progress Notes (Signed)
Cephas Darby, MD [address]  [place]  [place],  [address]  Main: [phone]  Fax: [phone]    Gastroenterology Consultation  Referring Provider:     Mikey College, * Primary Care Physician:  Mikey College, NP Primary Gastroenterologist:  Dr. Cephas Darby Reason for Consultation:     Chronic hepatitis C, chronic liver disease, chronic constipation        HPI:   [name] is a 52 y.o. y/o female referred by Dr. Merrilyn Puma, Jerrel Ivory, NP  for consultation & management of Chronic diarrhea, chronic hepatitis C and cirrhosis  She has multiple comorbidities as listed below. She was initially seen by Dr. Vicente Males in 11/2016 in our clinic for management of chronic hepatitis C and chronic diarrhea. At the time he recommended upper endoscopy, colonoscopy and workup for her liver disease. She reported that she moved to [facility] and was lost to follow-up. Prior to this she was also seen in [facility] in 04/2016 for chronic diarrhea, hepatitis C and dysphagia.  She was lost to follow-up at that time as well. Currently, she lives in [place], [place].   She has been having chronic diarrhea for more than a year intermittently. Most recent episode started about 10 days ago, after she received IV iron and Epogen at the time of dialysis. She reports having about 4-5 episodes of runny, nonbloody bowel movements. She describes that her stool was black when the diarrhea episode started, currently her stools are light yellow in color. She reports upper abdominal pain and associated with significant postprandial bloating. She denies urgency or incontinence, fever or chills, nausea or vomiting. She denies any weight loss, concern about weight gain especially in her waist. She denies swelling of legs. He denies hematemesis or coffee-ground emesis. She does have chronic normocytic anemia, and receives Epogen with her dialysis.  Hepatitis C Diagnosed in 2003- last used cocaine 6  years back , has had professional tatoos, has been incarcerated, no Armed forces logistics/support/administrative officer. Does not drink alcohol presently, no excess use in the past . No family history of liver disease. Genotype 1a,  high viral load, treatment nave, chronic liver disease manifested as splenomegaly, mild thrombocytopenia, hypoalbuminemia, fibrosis score 0.66, stage F3 in 07/2014. She is immune to hepatitis A and B. And, HIV nonreactive   Ferritin 45 in 02/2013 Her hemoglobin A1c is 8.3 in 06/2017 H Pylori stool antigen negative in 11/2016 Has been on oxycodone for chronic back pain   Follow up visit 10/01/2017 She is here for a follow-up earlier than she is scheduled with me. She did not get the labs that I ordered from last visit. She tried to go to the lab on 09/28/2017 but she had an accident in her pants secondary to fecal incontinence and had to leave right to the. She continues to have intermittent nonbloody diarrhea associated with urgency and incontinence. She took Imodium 2 pills 2 days ago. Did not have bowel movement yet. Her colon biopsies revealed focal active colitis with no background of chronicity. She cut back on smoking to 2 cigarettes a day  Follow-up visit 11/09/17: Overall, she feels good.  She denies having diarrhea anymore.  She is no longer on Imodium.  Currently, she is constipated since starting phosphate binders that contains calcium in it.  She is waiting to start hepatitis C treatment.  She got significantly cut back on smoking cigarettes.  She smoked only 4 cigarettes last week.    Follow-up visit 03/22/2018  She completed treatment for hepatitis C with Mavyret for 12 weeks. She reports that she finished treatment in first week of March. Overall, she says she hasn't been feeling well. Has been dealing with constipation associated with overflow diarrhea. She is struggling to find an appropriate diet that is suitable with her history of end-stage renal disease as well as that contains high fiber  to help with constipation. She does consume red meat, carbonated beverages, sweetened iced tea regularly. Her hemoglobin A1c has significantly improved. She does not have symptoms related to her cirrhosis.  Follow-up visit 06/17/2018 She is concerned about weight gain, 17 pounds in last 6 months associated with severe constipation. Her nephrologist does not think she is retaining fluid.she is also started on Humira for her psoriatic arthritis and on during her constipation is related to initiation of Humira. Otherwise, she denies any complaints.  Follow-up visit 08/02/2018 She continues to suffer from severe constipation. She tried plecanatide which resulted in severe diarrhea and cramps. She could not go to the dialysis because of diarrhea. She stopped taking plecanatide. He is unable to lose weight. She is accompanied by her husband today and both are frustrated with ongoing constipation which is limiting her by mouth intake. This started since starting Humira and prior to this she was experiencing diarrhea. She is not able to incorporate fiber foods much in her diet due to end-stage renal disease. She took the bowel prep since last visit in order to evacuate the stool prior to taking plecanatide.   GI Procedures:  EGD 09/22/2017 - Normal duodenal bulb and second portion of the duodenum. Biopsied. - Non-bleeding erosive gastropathy. Biopsied. - 1 cm hiatal hernia. - Z-line irregular. Biopsied. - Normal esophagus. Colonoscopy 09/22/2017    Past Medical History:  Diagnosis Date  . Allergy   . Anemia   . Anxiety    worse when not at home - bowel incontinence  . Arthritis   . Ataxia   . COPD (chronic obstructive pulmonary disease) ([facility])   . Depression   . Diabetes mellitus with complication ([place])   . ESRD on hemodialysis ([place])    MWF dialysis  . Fistula    left upper arm  . Hepatitis 2003   Hep C  . Hiatal hernia   . Hypercholesterolemia   . Hypertension   . Migraine   .  Neuropathy, diabetic ([facility])    lower legs  . Peripheral vascular disease ([place])   . Pneumonia 2015  . Psoriasis   . Tobacco dependence   . Wears dentures    full upper, partial lower    Past Surgical History:  Procedure Laterality Date  . A/V SHUNT INTERVENTION N/A 12/16/2017   Procedure: A/V SHUNT INTERVENTION;  Surgeon: Algernon Huxley, MD;  Location: [facility];  Service: Cardiovascular;  Laterality: N/A;  . AV FISTULA PLACEMENT Left 11/2014  . CESAREAN SECTION    . CHOLECYSTECTOMY    . COLONOSCOPY N/A 09/22/2017   Procedure: COLONOSCOPY;  Surgeon: Lin Landsman, MD;  Location: [facility];  Service: Endoscopy;  Laterality: N/A;  . cyst removed  from left hand Left 1989  . DIALYSIS/PERMA CATHETER INSERTION N/A 05/20/2017   Procedure: Dialysis/Perma Catheter Insertion and fistulagram/LUE angiogram;  Surgeon: Algernon Huxley, MD;  Location: [facility];  Service: Cardiovascular;  Laterality: N/A;  . ESOPHAGOGASTRODUODENOSCOPY N/A 09/22/2017   Procedure: ESOPHAGOGASTRODUODENOSCOPY (EGD);  Surgeon: Lin Landsman, MD;  Location: [facility];  Service: Endoscopy;  Laterality: N/A;  .  INCISION AND DRAINAGE ABSCESS N/A 07/16/2016   Procedure: INCISION AND DRAINAGE ABSCESS;  Surgeon: Carloyn Manner, MD;  Location: [facility];  Service: ENT;  Laterality: N/A;  . PERIPHERAL VASCULAR CATHETERIZATION N/A 07/25/2015   Procedure: A/V Shuntogram/Fistulagram;  Surgeon: Algernon Huxley, MD;  Location: [facility];  Service: Cardiovascular;  Laterality: N/A;  . PERIPHERAL VASCULAR CATHETERIZATION Left 07/25/2015   Procedure: A/V Shunt Intervention;  Surgeon: Algernon Huxley, MD;  Location: [facility];  Service: Cardiovascular;  Laterality: Left;  . PERIPHERAL VASCULAR CATHETERIZATION Left 10/07/2015   Procedure: A/V Shuntogram/Fistulagram;  Surgeon: Algernon Huxley, MD;  Location: [facility];  Service: Cardiovascular;  Laterality: Left;  . PERIPHERAL  VASCULAR CATHETERIZATION N/A 10/07/2015   Procedure: A/V Shunt Intervention;  Surgeon: Algernon Huxley, MD;  Location: [facility];  Service: Cardiovascular;  Laterality: N/A;  . PERIPHERAL VASCULAR CATHETERIZATION  10/07/2015   Procedure: Dialysis/Perma Catheter Insertion;  Surgeon: Algernon Huxley, MD;  Location: [facility];  Service: Cardiovascular;;  . PERIPHERAL VASCULAR CATHETERIZATION N/A 12/17/2015   Procedure: Dialysis/Perma Catheter Removal;  Surgeon: Katha Cabal, MD;  Location: [facility];  Service: Cardiovascular;  Laterality: N/A;  . PERIPHERAL VASCULAR CATHETERIZATION N/A 01/11/2017   Procedure: Visceral Angiography;  Surgeon: Algernon Huxley, MD;  Location: [facility];  Service: Cardiovascular;  Laterality: N/A;  . PERIPHERAL VASCULAR CATHETERIZATION N/A 01/11/2017   Procedure: Visceral Artery Intervention;  Surgeon: Algernon Huxley, MD;  Location: [facility];  Service: Cardiovascular;  Laterality: N/A;  . POLYPECTOMY  09/22/2017   Procedure: POLYPECTOMY;  Surgeon: Lin Landsman, MD;  Location: [facility];  Service: Endoscopy;;  . rt. tubal and ovary removed    . STENT PLACEMENT VASCULAR ([place] HX) Left 03/2018   Performed at [facility] using EV3 protg GPS stent graph [id number] lot [id number]  . TONSILLECTOMY    . TUBAL LIGATION       Current Outpatient Medications:  .  Adalimumab (HUMIRA PEN [place]), Inject into the skin every 14 (fourteen) days., Disp: , Rfl:  .  atorvastatin (LIPITOR) 20 MG tablet, Take 1 tablet (20 mg total) by mouth daily., Disp: 90 tablet, Rfl: 3 .  calcium acetate (PHOSLO) 667 MG capsule, Take 1,334 mg by mouth 3 (three) times daily with meals. , Disp: , Rfl:  .  clobetasol (OLUX) 0.05 % topical foam, Apply topically 2 (two) times daily., Disp: , Rfl:  .  clobetasol ointment (TEMOVATE) 6.04 %, Apply 1 application topically daily as needed (for psorasis). , Disp: , Rfl:  .  diltiazem  (CARDIZEM) 60 MG tablet, Take 1 tablet (60 mg total) by mouth 3 (three) times daily., Disp: 270 tablet, Rfl: 3 .  glucose blood (TRUE METRIX BLOOD GLUCOSE TEST) test strip, Use as instructed, Disp: 100 each, Rfl: 12 .  insulin aspart (NOVOLOG) 100 UNIT/ML injection, Inject 1-2 Units into the skin 3 (three) times daily before meals., Disp: 10 mL, Rfl: 11 .  insulin glargine (LANTUS) 100 UNIT/ML injection, Inject 15 Units into the skin daily. , Disp: , Rfl:  .  Insulin Syringe 27G X 1/2" 1 ML MISC, 1 Syringe by Does not apply route 4 (four) times daily., Disp: 120 each, Rfl: 4 .  ketoconazole (NIZORAL) 2 % cream, APPLY TOPICALLY ONTO THE SKIN TWICE A DAY AS NEEDED, Disp: , Rfl: 2 .  lactulose (CHRONULAC) 10 GM/15ML solution, Take by mouth daily as needed for mild  constipation., Disp: , Rfl:  .  levocetirizine (XYZAL) 5 MG tablet, TAKE 1 TABLET(5 MG) BY MOUTH EVERY EVENING, Disp: 30 tablet, Rfl: 11 .  loperamide (IMODIUM A-D) 2 MG tablet, Take 2 mg by mouth 3 (three) times daily as needed. , Disp: , Rfl:  .  midodrine (PROAMATINE) 5 MG tablet, TK 1 T PO  DURING DIALYSIS TREATMENT FOR LOW BP, Disp: , Rfl: 5 .  ondansetron (ZOFRAN) 8 MG tablet, Take by mouth., Disp: , Rfl:  .  sevelamer carbonate (RENVELA) 800 MG tablet, Take 1,600 mg by mouth 3 (three) times daily with meals., Disp: , Rfl:  .  sodium polystyrene (KAYEXALATE) powder, MIX 30 GRAMS (8 LEVEL TEASPOONSFUL OF POWER) IN WATER & DRINK BY MOUTH AS A ONE TIME DOSE, Disp: , Rfl: 1 .  torsemide (DEMADEX) 100 MG tablet, Take 100 mg by mouth daily., Disp: , Rfl:  .  ULTICARE INSULIN SYRINGE 29G X 1/2" 0.5 ML MISC, USE 4 TIMES A DAY AS DIRECTED PER MD, Disp: , Rfl: 1 .  VOLTAREN 1 % GEL, APPLY 4 GRAMS TOPICALLY QID, Disp: , Rfl: 2   Family History  Problem Relation Age of Onset  . Hypertension Mother   . Heart disease Mother   . Hypertension Father   . Skin cancer Father      Social History   Tobacco Use  . Smoking status: Light Tobacco  Smoker    Packs/day: 0.25    Years: 20.00    Pack years: 5.00    Types: Cigarettes    Last attempt to quit: 05/21/2017    Years since quitting: 1.2  . Smokeless tobacco: Never Used  Substance Use Topics  . Alcohol use: No  . Drug use: No    Allergies as of 08/02/2018 - Review Complete 08/02/2018  Allergen Reaction Noted  . Cinnamon Anaphylaxis 10/11/2015  . Garlic Anaphylaxis and Hives 11/28/2014  . Onion Hives and Swelling 11/28/2014  . Tylenol [acetaminophen] Anaphylaxis 07/25/2015  . Ciprofloxacin Diarrhea 11/05/2016  . Prednisone Other (See Comments) 01/05/2017    Review of Systems:    All systems reviewed and negative except where noted in HPI.   Physical Exam:  BP (!) 143/78   Pulse 90   Ht '5\' 7"'$  (1.702 m)   Wt 221 lb 12.8 oz (100.6 kg)   BMI 34.74 kg/m  No LMP recorded. Patient is postmenopausal.  General:   Alert,  Well-developed, well-nourished, pleasant and cooperative in NAD Head:  Normocephalic and atraumatic. Eyes:  Sclera clear, no icterus.   Conjunctiva pink. Ears:  Normal auditory acuity. Nose:  No deformity, discharge, or lesions. Mouth:  No deformity or lesions,oropharynx pink & moist. Neck:  Supple; no masses or thyromegaly. Lungs:  Respirations even and unlabored.  Clear throughout to auscultation.   No wheezes, crackles, or rhonchi. No acute distress. Heart:  Regular rate and rhythm; no murmurs, clicks, rubs, or gallops. Abdomen:  Normal bowel sounds.  Abdominal obesity, significantly redundant skin, abdominal striae present, no appreciable ascites on exam.  Soft, non-tender and non-distended without masses, hepatosplenomegaly or hernias noted.  No guarding or rebound tenderness.   Rectal: Nor performed Msk:  Symmetrical without gross deformities. Good, equal movement & strength bilaterally. Pulses:  Normal pulses noted. Extremities:  No clubbing or edema.  No cyanosis, left upper arm AV fistula, bruit palpable. Neurologic:  Alert and oriented x3;   grossly normal neurologically. Skin:  No jaundice. Lymph Nodes:  No significant cervical adenopathy. Psych:  Alert and cooperative.  Normal mood and affect.  Imaging Studies:  Reviewed  Assessment and Plan:   YAA DONNELLAN is a 52 y.o. White  female with  ESRD on hemodialysis, diabetes on insulin, chronic hepatitis C, genotype 1a, s/p treatment with Mavyret x12weeks finished in March 2019, cirrhosis, well compensated. She has alternating episodes of diarrhea and constipation, but predominantly constipation. Colonic mucosa appeared normal but random colon biopsies revealed mild focal active colitis on colonoscopy in 09/2017. Currently, on Humira for her psoriatic arthritis. Differentials include early IBD or nonspecific or secondary to bowel prep - Her ESR was significantly elevated - Fecal lactoferrin normal, she did not get the rest of the stool studies that I ordered as she is constipated  Constipation: - she reports having TSH checked at her nephrologist's office and it was normal - discussed with her about alternative medication such as linaclotide and Amitiza - She is willing to try linaclotide 72 MCG daily after bowel evacuation with half a gallon of GoLYTELY   Advanced fibrosis/early cirrhosis: Secondary to chronic hepatitis C Fibrosure from 2015 suggests F3, she does have mild splenomegaly, thrombocytopenia, hypoalbuminemia, hyperbilirubinemia CPT score 6, class A Completed secondary liver disease workup and negative for ANA, Antimitochondrial antibodies, anti-smooth muscle antibodies. Ferritin normal. She is immune to hepatitis A and B. Alpha-1 antitrypsin levels are normal - EGD for variceal screening, up to date 09/22/2017, no evidence of varices. Repeat EGD in 2020 - No evidence of volume overload, recommend 2 g sodium diet - No evidence of encephalopathy - She does have mild normocytic anemia and thrombocytopenia. No coagulopathy. Ferritin normal - HCC screening -  alpha-fetoprotein normal, ultrasound liver 09/28/2017, no liver lesions. Repeat ultrasound and AFP in 04/2018 unremarkable for liver lesions  Chronic hepatitis C, genotype 1a, viral load 255,801 units in 08/11/2016: status post Mavyret for 12 weeks, finished treatment in March 2019, negative cryoglobulin levels - She is immune to hepatitis A and B. Her hep B surface antigen nonreactive, hep B surface antibody reactive, hep B core antibody nonreactive, hepatitis A IgG reactive in 08/11/2016. Hepatitis E antigen and antibody negative - Recheck HCV viral load today  Large hemorrhoids -currently no symptoms Hemorrhoid banding in future if needed  Health maintenance: Immune to hepatitis A and B vaccine Received Pneumovax on 09/2012 Recommend annual influenza vaccine Recommend zoster vaccine Colon cancer screening: Colonoscopy on 09/22/2017 revealed tubular adenomas, size greater than 10 mm. Repeat colonoscopy in 09/2020 Check Vit D levels in next visit   Follow up in 2 weeks  Cephas Darby, MD

## 2018-08-03 DIAGNOSIS — D631 Anemia in chronic kidney disease: Secondary | ICD-10-CM | POA: Diagnosis not present

## 2018-08-03 DIAGNOSIS — Z992 Dependence on renal dialysis: Secondary | ICD-10-CM | POA: Diagnosis not present

## 2018-08-03 DIAGNOSIS — N186 End stage renal disease: Secondary | ICD-10-CM | POA: Diagnosis not present

## 2018-08-03 DIAGNOSIS — D509 Iron deficiency anemia, unspecified: Secondary | ICD-10-CM | POA: Diagnosis not present

## 2018-08-03 DIAGNOSIS — N2581 Secondary hyperparathyroidism of renal origin: Secondary | ICD-10-CM | POA: Diagnosis not present

## 2018-08-06 DIAGNOSIS — D509 Iron deficiency anemia, unspecified: Secondary | ICD-10-CM | POA: Diagnosis not present

## 2018-08-06 DIAGNOSIS — Z992 Dependence on renal dialysis: Secondary | ICD-10-CM | POA: Diagnosis not present

## 2018-08-06 DIAGNOSIS — D631 Anemia in chronic kidney disease: Secondary | ICD-10-CM | POA: Diagnosis not present

## 2018-08-06 DIAGNOSIS — N186 End stage renal disease: Secondary | ICD-10-CM | POA: Diagnosis not present

## 2018-08-06 DIAGNOSIS — N2581 Secondary hyperparathyroidism of renal origin: Secondary | ICD-10-CM | POA: Diagnosis not present

## 2018-08-08 DIAGNOSIS — N186 End stage renal disease: Secondary | ICD-10-CM | POA: Diagnosis not present

## 2018-08-08 DIAGNOSIS — Z992 Dependence on renal dialysis: Secondary | ICD-10-CM | POA: Diagnosis not present

## 2018-08-08 DIAGNOSIS — D631 Anemia in chronic kidney disease: Secondary | ICD-10-CM | POA: Diagnosis not present

## 2018-08-08 DIAGNOSIS — D509 Iron deficiency anemia, unspecified: Secondary | ICD-10-CM | POA: Diagnosis not present

## 2018-08-08 DIAGNOSIS — N2581 Secondary hyperparathyroidism of renal origin: Secondary | ICD-10-CM | POA: Diagnosis not present

## 2018-08-10 DIAGNOSIS — D509 Iron deficiency anemia, unspecified: Secondary | ICD-10-CM | POA: Diagnosis not present

## 2018-08-10 DIAGNOSIS — N186 End stage renal disease: Secondary | ICD-10-CM | POA: Diagnosis not present

## 2018-08-10 DIAGNOSIS — D631 Anemia in chronic kidney disease: Secondary | ICD-10-CM | POA: Diagnosis not present

## 2018-08-10 DIAGNOSIS — N2581 Secondary hyperparathyroidism of renal origin: Secondary | ICD-10-CM | POA: Diagnosis not present

## 2018-08-10 DIAGNOSIS — Z992 Dependence on renal dialysis: Secondary | ICD-10-CM | POA: Diagnosis not present

## 2018-08-12 DIAGNOSIS — D509 Iron deficiency anemia, unspecified: Secondary | ICD-10-CM | POA: Diagnosis not present

## 2018-08-12 DIAGNOSIS — N2581 Secondary hyperparathyroidism of renal origin: Secondary | ICD-10-CM | POA: Diagnosis not present

## 2018-08-12 DIAGNOSIS — N186 End stage renal disease: Secondary | ICD-10-CM | POA: Diagnosis not present

## 2018-08-12 DIAGNOSIS — Z992 Dependence on renal dialysis: Secondary | ICD-10-CM | POA: Diagnosis not present

## 2018-08-12 DIAGNOSIS — D631 Anemia in chronic kidney disease: Secondary | ICD-10-CM | POA: Diagnosis not present

## 2018-08-15 ENCOUNTER — Other Ambulatory Visit: Payer: Self-pay

## 2018-08-15 DIAGNOSIS — N186 End stage renal disease: Secondary | ICD-10-CM | POA: Diagnosis not present

## 2018-08-15 DIAGNOSIS — Z992 Dependence on renal dialysis: Secondary | ICD-10-CM | POA: Diagnosis not present

## 2018-08-15 DIAGNOSIS — D509 Iron deficiency anemia, unspecified: Secondary | ICD-10-CM | POA: Diagnosis not present

## 2018-08-15 DIAGNOSIS — E113512 Type 2 diabetes mellitus with proliferative diabetic retinopathy with macular edema, left eye: Secondary | ICD-10-CM | POA: Diagnosis not present

## 2018-08-15 DIAGNOSIS — D631 Anemia in chronic kidney disease: Secondary | ICD-10-CM | POA: Diagnosis not present

## 2018-08-15 DIAGNOSIS — N2581 Secondary hyperparathyroidism of renal origin: Secondary | ICD-10-CM | POA: Diagnosis not present

## 2018-08-15 DIAGNOSIS — H4311 Vitreous hemorrhage, right eye: Secondary | ICD-10-CM | POA: Diagnosis not present

## 2018-08-15 MED ORDER — "ULTICARE INSULIN SYRINGE 29G X 1/2"" 0.5 ML MISC": 5 refills | Status: DC

## 2018-08-16 DIAGNOSIS — T82898A Other specified complication of vascular prosthetic devices, implants and grafts, initial encounter: Secondary | ICD-10-CM | POA: Diagnosis not present

## 2018-08-16 DIAGNOSIS — T82856A Stenosis of peripheral vascular stent, initial encounter: Secondary | ICD-10-CM | POA: Diagnosis not present

## 2018-08-16 DIAGNOSIS — Z992 Dependence on renal dialysis: Secondary | ICD-10-CM | POA: Diagnosis not present

## 2018-08-16 DIAGNOSIS — T82848A Pain from vascular prosthetic devices, implants and grafts, initial encounter: Secondary | ICD-10-CM | POA: Diagnosis not present

## 2018-08-16 DIAGNOSIS — N186 End stage renal disease: Secondary | ICD-10-CM | POA: Diagnosis not present

## 2018-08-17 DIAGNOSIS — D631 Anemia in chronic kidney disease: Secondary | ICD-10-CM | POA: Diagnosis not present

## 2018-08-17 DIAGNOSIS — Z992 Dependence on renal dialysis: Secondary | ICD-10-CM | POA: Diagnosis not present

## 2018-08-17 DIAGNOSIS — N186 End stage renal disease: Secondary | ICD-10-CM | POA: Diagnosis not present

## 2018-08-17 DIAGNOSIS — D509 Iron deficiency anemia, unspecified: Secondary | ICD-10-CM | POA: Diagnosis not present

## 2018-08-17 DIAGNOSIS — N2581 Secondary hyperparathyroidism of renal origin: Secondary | ICD-10-CM | POA: Diagnosis not present

## 2018-08-19 ENCOUNTER — Ambulatory Visit: Payer: Medicare Other | Admitting: Gastroenterology

## 2018-08-19 DIAGNOSIS — D509 Iron deficiency anemia, unspecified: Secondary | ICD-10-CM | POA: Diagnosis not present

## 2018-08-19 DIAGNOSIS — N186 End stage renal disease: Secondary | ICD-10-CM | POA: Diagnosis not present

## 2018-08-19 DIAGNOSIS — Z992 Dependence on renal dialysis: Secondary | ICD-10-CM | POA: Diagnosis not present

## 2018-08-19 DIAGNOSIS — N2581 Secondary hyperparathyroidism of renal origin: Secondary | ICD-10-CM | POA: Diagnosis not present

## 2018-08-19 DIAGNOSIS — D631 Anemia in chronic kidney disease: Secondary | ICD-10-CM | POA: Diagnosis not present

## 2018-08-20 DIAGNOSIS — Z992 Dependence on renal dialysis: Secondary | ICD-10-CM | POA: Diagnosis not present

## 2018-08-20 DIAGNOSIS — N186 End stage renal disease: Secondary | ICD-10-CM | POA: Diagnosis not present

## 2018-08-22 DIAGNOSIS — N2581 Secondary hyperparathyroidism of renal origin: Secondary | ICD-10-CM | POA: Diagnosis not present

## 2018-08-22 DIAGNOSIS — Z992 Dependence on renal dialysis: Secondary | ICD-10-CM | POA: Diagnosis not present

## 2018-08-22 DIAGNOSIS — N186 End stage renal disease: Secondary | ICD-10-CM | POA: Diagnosis not present

## 2018-08-22 DIAGNOSIS — D631 Anemia in chronic kidney disease: Secondary | ICD-10-CM | POA: Diagnosis not present

## 2018-08-22 DIAGNOSIS — D509 Iron deficiency anemia, unspecified: Secondary | ICD-10-CM | POA: Diagnosis not present

## 2018-08-24 DIAGNOSIS — D631 Anemia in chronic kidney disease: Secondary | ICD-10-CM | POA: Diagnosis not present

## 2018-08-24 DIAGNOSIS — N186 End stage renal disease: Secondary | ICD-10-CM | POA: Diagnosis not present

## 2018-08-24 DIAGNOSIS — Z992 Dependence on renal dialysis: Secondary | ICD-10-CM | POA: Diagnosis not present

## 2018-08-24 DIAGNOSIS — N2581 Secondary hyperparathyroidism of renal origin: Secondary | ICD-10-CM | POA: Diagnosis not present

## 2018-08-24 DIAGNOSIS — D509 Iron deficiency anemia, unspecified: Secondary | ICD-10-CM | POA: Diagnosis not present

## 2018-08-25 DIAGNOSIS — M47812 Spondylosis without myelopathy or radiculopathy, cervical region: Secondary | ICD-10-CM | POA: Insufficient documentation

## 2018-08-25 DIAGNOSIS — L409 Psoriasis, unspecified: Secondary | ICD-10-CM | POA: Diagnosis not present

## 2018-08-25 DIAGNOSIS — N186 End stage renal disease: Secondary | ICD-10-CM | POA: Diagnosis not present

## 2018-08-25 DIAGNOSIS — M4722 Other spondylosis with radiculopathy, cervical region: Secondary | ICD-10-CM | POA: Insufficient documentation

## 2018-08-25 DIAGNOSIS — B182 Chronic viral hepatitis C: Secondary | ICD-10-CM | POA: Diagnosis not present

## 2018-08-25 DIAGNOSIS — M0579 Rheumatoid arthritis with rheumatoid factor of multiple sites without organ or systems involvement: Secondary | ICD-10-CM | POA: Diagnosis not present

## 2018-08-25 DIAGNOSIS — Z992 Dependence on renal dialysis: Secondary | ICD-10-CM | POA: Diagnosis not present

## 2018-08-26 DIAGNOSIS — N186 End stage renal disease: Secondary | ICD-10-CM | POA: Diagnosis not present

## 2018-08-26 DIAGNOSIS — D509 Iron deficiency anemia, unspecified: Secondary | ICD-10-CM | POA: Diagnosis not present

## 2018-08-26 DIAGNOSIS — D631 Anemia in chronic kidney disease: Secondary | ICD-10-CM | POA: Diagnosis not present

## 2018-08-26 DIAGNOSIS — N2581 Secondary hyperparathyroidism of renal origin: Secondary | ICD-10-CM | POA: Diagnosis not present

## 2018-08-26 DIAGNOSIS — Z992 Dependence on renal dialysis: Secondary | ICD-10-CM | POA: Diagnosis not present

## 2018-08-29 DIAGNOSIS — D509 Iron deficiency anemia, unspecified: Secondary | ICD-10-CM | POA: Diagnosis not present

## 2018-08-29 DIAGNOSIS — Z992 Dependence on renal dialysis: Secondary | ICD-10-CM | POA: Diagnosis not present

## 2018-08-29 DIAGNOSIS — D631 Anemia in chronic kidney disease: Secondary | ICD-10-CM | POA: Diagnosis not present

## 2018-08-29 DIAGNOSIS — N186 End stage renal disease: Secondary | ICD-10-CM | POA: Diagnosis not present

## 2018-08-29 DIAGNOSIS — N2581 Secondary hyperparathyroidism of renal origin: Secondary | ICD-10-CM | POA: Diagnosis not present

## 2018-08-31 DIAGNOSIS — N2581 Secondary hyperparathyroidism of renal origin: Secondary | ICD-10-CM | POA: Diagnosis not present

## 2018-08-31 DIAGNOSIS — N186 End stage renal disease: Secondary | ICD-10-CM | POA: Diagnosis not present

## 2018-08-31 DIAGNOSIS — D631 Anemia in chronic kidney disease: Secondary | ICD-10-CM | POA: Diagnosis not present

## 2018-08-31 DIAGNOSIS — Z992 Dependence on renal dialysis: Secondary | ICD-10-CM | POA: Diagnosis not present

## 2018-08-31 DIAGNOSIS — D509 Iron deficiency anemia, unspecified: Secondary | ICD-10-CM | POA: Diagnosis not present

## 2018-09-02 DIAGNOSIS — D631 Anemia in chronic kidney disease: Secondary | ICD-10-CM | POA: Diagnosis not present

## 2018-09-02 DIAGNOSIS — N186 End stage renal disease: Secondary | ICD-10-CM | POA: Diagnosis not present

## 2018-09-02 DIAGNOSIS — N2581 Secondary hyperparathyroidism of renal origin: Secondary | ICD-10-CM | POA: Diagnosis not present

## 2018-09-02 DIAGNOSIS — D509 Iron deficiency anemia, unspecified: Secondary | ICD-10-CM | POA: Diagnosis not present

## 2018-09-02 DIAGNOSIS — Z992 Dependence on renal dialysis: Secondary | ICD-10-CM | POA: Diagnosis not present

## 2018-09-05 DIAGNOSIS — N186 End stage renal disease: Secondary | ICD-10-CM | POA: Diagnosis not present

## 2018-09-05 DIAGNOSIS — D631 Anemia in chronic kidney disease: Secondary | ICD-10-CM | POA: Diagnosis not present

## 2018-09-05 DIAGNOSIS — N2581 Secondary hyperparathyroidism of renal origin: Secondary | ICD-10-CM | POA: Diagnosis not present

## 2018-09-05 DIAGNOSIS — Z992 Dependence on renal dialysis: Secondary | ICD-10-CM | POA: Diagnosis not present

## 2018-09-05 DIAGNOSIS — D509 Iron deficiency anemia, unspecified: Secondary | ICD-10-CM | POA: Diagnosis not present

## 2018-09-07 DIAGNOSIS — D631 Anemia in chronic kidney disease: Secondary | ICD-10-CM | POA: Diagnosis not present

## 2018-09-07 DIAGNOSIS — N186 End stage renal disease: Secondary | ICD-10-CM | POA: Diagnosis not present

## 2018-09-07 DIAGNOSIS — N2581 Secondary hyperparathyroidism of renal origin: Secondary | ICD-10-CM | POA: Diagnosis not present

## 2018-09-07 DIAGNOSIS — D509 Iron deficiency anemia, unspecified: Secondary | ICD-10-CM | POA: Diagnosis not present

## 2018-09-07 DIAGNOSIS — Z992 Dependence on renal dialysis: Secondary | ICD-10-CM | POA: Diagnosis not present

## 2018-09-09 DIAGNOSIS — D631 Anemia in chronic kidney disease: Secondary | ICD-10-CM | POA: Diagnosis not present

## 2018-09-09 DIAGNOSIS — Z992 Dependence on renal dialysis: Secondary | ICD-10-CM | POA: Diagnosis not present

## 2018-09-09 DIAGNOSIS — N2581 Secondary hyperparathyroidism of renal origin: Secondary | ICD-10-CM | POA: Diagnosis not present

## 2018-09-09 DIAGNOSIS — D509 Iron deficiency anemia, unspecified: Secondary | ICD-10-CM | POA: Diagnosis not present

## 2018-09-09 DIAGNOSIS — N186 End stage renal disease: Secondary | ICD-10-CM | POA: Diagnosis not present

## 2018-09-12 DIAGNOSIS — D509 Iron deficiency anemia, unspecified: Secondary | ICD-10-CM | POA: Diagnosis not present

## 2018-09-12 DIAGNOSIS — D631 Anemia in chronic kidney disease: Secondary | ICD-10-CM | POA: Diagnosis not present

## 2018-09-12 DIAGNOSIS — N186 End stage renal disease: Secondary | ICD-10-CM | POA: Diagnosis not present

## 2018-09-12 DIAGNOSIS — Z992 Dependence on renal dialysis: Secondary | ICD-10-CM | POA: Diagnosis not present

## 2018-09-12 DIAGNOSIS — N2581 Secondary hyperparathyroidism of renal origin: Secondary | ICD-10-CM | POA: Diagnosis not present

## 2018-09-13 DIAGNOSIS — D631 Anemia in chronic kidney disease: Secondary | ICD-10-CM | POA: Diagnosis not present

## 2018-09-13 DIAGNOSIS — Z992 Dependence on renal dialysis: Secondary | ICD-10-CM | POA: Diagnosis not present

## 2018-09-13 DIAGNOSIS — N2581 Secondary hyperparathyroidism of renal origin: Secondary | ICD-10-CM | POA: Diagnosis not present

## 2018-09-13 DIAGNOSIS — D509 Iron deficiency anemia, unspecified: Secondary | ICD-10-CM | POA: Diagnosis not present

## 2018-09-13 DIAGNOSIS — N186 End stage renal disease: Secondary | ICD-10-CM | POA: Diagnosis not present

## 2018-09-14 DIAGNOSIS — I509 Heart failure, unspecified: Secondary | ICD-10-CM | POA: Diagnosis not present

## 2018-09-14 DIAGNOSIS — I130 Hypertensive heart and chronic kidney disease with heart failure and stage 1 through stage 4 chronic kidney disease, or unspecified chronic kidney disease: Secondary | ICD-10-CM | POA: Diagnosis not present

## 2018-09-14 DIAGNOSIS — E1122 Type 2 diabetes mellitus with diabetic chronic kidney disease: Secondary | ICD-10-CM | POA: Diagnosis not present

## 2018-09-14 DIAGNOSIS — E113592 Type 2 diabetes mellitus with proliferative diabetic retinopathy without macular edema, left eye: Secondary | ICD-10-CM | POA: Diagnosis not present

## 2018-09-14 DIAGNOSIS — N189 Chronic kidney disease, unspecified: Secondary | ICD-10-CM | POA: Diagnosis not present

## 2018-09-14 DIAGNOSIS — H4312 Vitreous hemorrhage, left eye: Secondary | ICD-10-CM | POA: Diagnosis not present

## 2018-09-15 DIAGNOSIS — H4312 Vitreous hemorrhage, left eye: Secondary | ICD-10-CM | POA: Insufficient documentation

## 2018-09-16 DIAGNOSIS — D509 Iron deficiency anemia, unspecified: Secondary | ICD-10-CM | POA: Diagnosis not present

## 2018-09-16 DIAGNOSIS — N186 End stage renal disease: Secondary | ICD-10-CM | POA: Diagnosis not present

## 2018-09-16 DIAGNOSIS — Z992 Dependence on renal dialysis: Secondary | ICD-10-CM | POA: Diagnosis not present

## 2018-09-16 DIAGNOSIS — N2581 Secondary hyperparathyroidism of renal origin: Secondary | ICD-10-CM | POA: Diagnosis not present

## 2018-09-16 DIAGNOSIS — D631 Anemia in chronic kidney disease: Secondary | ICD-10-CM | POA: Diagnosis not present

## 2018-09-19 DIAGNOSIS — Z992 Dependence on renal dialysis: Secondary | ICD-10-CM | POA: Diagnosis not present

## 2018-09-19 DIAGNOSIS — N186 End stage renal disease: Secondary | ICD-10-CM | POA: Diagnosis not present

## 2018-09-19 DIAGNOSIS — D631 Anemia in chronic kidney disease: Secondary | ICD-10-CM | POA: Diagnosis not present

## 2018-09-19 DIAGNOSIS — N2581 Secondary hyperparathyroidism of renal origin: Secondary | ICD-10-CM | POA: Diagnosis not present

## 2018-09-19 DIAGNOSIS — D509 Iron deficiency anemia, unspecified: Secondary | ICD-10-CM | POA: Diagnosis not present

## 2018-09-20 DIAGNOSIS — H4312 Vitreous hemorrhage, left eye: Secondary | ICD-10-CM | POA: Diagnosis not present

## 2018-09-21 DIAGNOSIS — D509 Iron deficiency anemia, unspecified: Secondary | ICD-10-CM | POA: Diagnosis not present

## 2018-09-21 DIAGNOSIS — N2581 Secondary hyperparathyroidism of renal origin: Secondary | ICD-10-CM | POA: Diagnosis not present

## 2018-09-21 DIAGNOSIS — Z23 Encounter for immunization: Secondary | ICD-10-CM | POA: Diagnosis not present

## 2018-09-21 DIAGNOSIS — Z992 Dependence on renal dialysis: Secondary | ICD-10-CM | POA: Diagnosis not present

## 2018-09-21 DIAGNOSIS — N186 End stage renal disease: Secondary | ICD-10-CM | POA: Diagnosis not present

## 2018-09-21 DIAGNOSIS — D631 Anemia in chronic kidney disease: Secondary | ICD-10-CM | POA: Diagnosis not present

## 2018-09-23 DIAGNOSIS — N186 End stage renal disease: Secondary | ICD-10-CM | POA: Diagnosis not present

## 2018-09-23 DIAGNOSIS — D509 Iron deficiency anemia, unspecified: Secondary | ICD-10-CM | POA: Diagnosis not present

## 2018-09-23 DIAGNOSIS — Z992 Dependence on renal dialysis: Secondary | ICD-10-CM | POA: Diagnosis not present

## 2018-09-23 DIAGNOSIS — D631 Anemia in chronic kidney disease: Secondary | ICD-10-CM | POA: Diagnosis not present

## 2018-09-23 DIAGNOSIS — N2581 Secondary hyperparathyroidism of renal origin: Secondary | ICD-10-CM | POA: Diagnosis not present

## 2018-09-23 DIAGNOSIS — Z23 Encounter for immunization: Secondary | ICD-10-CM | POA: Diagnosis not present

## 2018-09-26 DIAGNOSIS — N2581 Secondary hyperparathyroidism of renal origin: Secondary | ICD-10-CM | POA: Diagnosis not present

## 2018-09-26 DIAGNOSIS — D631 Anemia in chronic kidney disease: Secondary | ICD-10-CM | POA: Diagnosis not present

## 2018-09-26 DIAGNOSIS — N186 End stage renal disease: Secondary | ICD-10-CM | POA: Diagnosis not present

## 2018-09-26 DIAGNOSIS — Z992 Dependence on renal dialysis: Secondary | ICD-10-CM | POA: Diagnosis not present

## 2018-09-26 DIAGNOSIS — D509 Iron deficiency anemia, unspecified: Secondary | ICD-10-CM | POA: Diagnosis not present

## 2018-09-26 DIAGNOSIS — Z23 Encounter for immunization: Secondary | ICD-10-CM | POA: Diagnosis not present

## 2018-09-28 DIAGNOSIS — Z23 Encounter for immunization: Secondary | ICD-10-CM | POA: Diagnosis not present

## 2018-09-28 DIAGNOSIS — N186 End stage renal disease: Secondary | ICD-10-CM | POA: Diagnosis not present

## 2018-09-28 DIAGNOSIS — D509 Iron deficiency anemia, unspecified: Secondary | ICD-10-CM | POA: Diagnosis not present

## 2018-09-28 DIAGNOSIS — Z992 Dependence on renal dialysis: Secondary | ICD-10-CM | POA: Diagnosis not present

## 2018-09-28 DIAGNOSIS — N2581 Secondary hyperparathyroidism of renal origin: Secondary | ICD-10-CM | POA: Diagnosis not present

## 2018-09-28 DIAGNOSIS — D631 Anemia in chronic kidney disease: Secondary | ICD-10-CM | POA: Diagnosis not present

## 2018-09-29 DIAGNOSIS — N2581 Secondary hyperparathyroidism of renal origin: Secondary | ICD-10-CM | POA: Diagnosis not present

## 2018-09-29 DIAGNOSIS — Z992 Dependence on renal dialysis: Secondary | ICD-10-CM | POA: Diagnosis not present

## 2018-09-29 DIAGNOSIS — D631 Anemia in chronic kidney disease: Secondary | ICD-10-CM | POA: Diagnosis not present

## 2018-09-29 DIAGNOSIS — N186 End stage renal disease: Secondary | ICD-10-CM | POA: Diagnosis not present

## 2018-09-29 DIAGNOSIS — Z23 Encounter for immunization: Secondary | ICD-10-CM | POA: Diagnosis not present

## 2018-09-29 DIAGNOSIS — D509 Iron deficiency anemia, unspecified: Secondary | ICD-10-CM | POA: Diagnosis not present

## 2018-09-30 DIAGNOSIS — I871 Compression of vein: Secondary | ICD-10-CM | POA: Diagnosis not present

## 2018-09-30 DIAGNOSIS — N186 End stage renal disease: Secondary | ICD-10-CM | POA: Diagnosis not present

## 2018-09-30 DIAGNOSIS — Z992 Dependence on renal dialysis: Secondary | ICD-10-CM | POA: Diagnosis not present

## 2018-09-30 DIAGNOSIS — T82858A Stenosis of vascular prosthetic devices, implants and grafts, initial encounter: Secondary | ICD-10-CM | POA: Diagnosis not present

## 2018-10-03 DIAGNOSIS — N186 End stage renal disease: Secondary | ICD-10-CM | POA: Diagnosis not present

## 2018-10-03 DIAGNOSIS — N2581 Secondary hyperparathyroidism of renal origin: Secondary | ICD-10-CM | POA: Diagnosis not present

## 2018-10-03 DIAGNOSIS — D509 Iron deficiency anemia, unspecified: Secondary | ICD-10-CM | POA: Diagnosis not present

## 2018-10-03 DIAGNOSIS — Z992 Dependence on renal dialysis: Secondary | ICD-10-CM | POA: Diagnosis not present

## 2018-10-03 DIAGNOSIS — D631 Anemia in chronic kidney disease: Secondary | ICD-10-CM | POA: Diagnosis not present

## 2018-10-03 DIAGNOSIS — Z23 Encounter for immunization: Secondary | ICD-10-CM | POA: Diagnosis not present

## 2018-10-05 ENCOUNTER — Ambulatory Visit: Payer: Self-pay | Admitting: Nurse Practitioner

## 2018-10-05 DIAGNOSIS — D631 Anemia in chronic kidney disease: Secondary | ICD-10-CM | POA: Diagnosis not present

## 2018-10-05 DIAGNOSIS — Z23 Encounter for immunization: Secondary | ICD-10-CM | POA: Diagnosis not present

## 2018-10-05 DIAGNOSIS — N186 End stage renal disease: Secondary | ICD-10-CM | POA: Diagnosis not present

## 2018-10-05 DIAGNOSIS — Z992 Dependence on renal dialysis: Secondary | ICD-10-CM | POA: Diagnosis not present

## 2018-10-05 DIAGNOSIS — N2581 Secondary hyperparathyroidism of renal origin: Secondary | ICD-10-CM | POA: Diagnosis not present

## 2018-10-05 DIAGNOSIS — D509 Iron deficiency anemia, unspecified: Secondary | ICD-10-CM | POA: Diagnosis not present

## 2018-10-07 DIAGNOSIS — D631 Anemia in chronic kidney disease: Secondary | ICD-10-CM | POA: Diagnosis not present

## 2018-10-07 DIAGNOSIS — N186 End stage renal disease: Secondary | ICD-10-CM | POA: Diagnosis not present

## 2018-10-07 DIAGNOSIS — Z23 Encounter for immunization: Secondary | ICD-10-CM | POA: Diagnosis not present

## 2018-10-07 DIAGNOSIS — Z992 Dependence on renal dialysis: Secondary | ICD-10-CM | POA: Diagnosis not present

## 2018-10-07 DIAGNOSIS — N2581 Secondary hyperparathyroidism of renal origin: Secondary | ICD-10-CM | POA: Diagnosis not present

## 2018-10-07 DIAGNOSIS — D509 Iron deficiency anemia, unspecified: Secondary | ICD-10-CM | POA: Diagnosis not present

## 2018-10-10 DIAGNOSIS — N2581 Secondary hyperparathyroidism of renal origin: Secondary | ICD-10-CM | POA: Diagnosis not present

## 2018-10-10 DIAGNOSIS — D631 Anemia in chronic kidney disease: Secondary | ICD-10-CM | POA: Diagnosis not present

## 2018-10-10 DIAGNOSIS — Z23 Encounter for immunization: Secondary | ICD-10-CM | POA: Diagnosis not present

## 2018-10-10 DIAGNOSIS — D509 Iron deficiency anemia, unspecified: Secondary | ICD-10-CM | POA: Diagnosis not present

## 2018-10-10 DIAGNOSIS — Z992 Dependence on renal dialysis: Secondary | ICD-10-CM | POA: Diagnosis not present

## 2018-10-10 DIAGNOSIS — N186 End stage renal disease: Secondary | ICD-10-CM | POA: Diagnosis not present

## 2018-10-12 DIAGNOSIS — N2581 Secondary hyperparathyroidism of renal origin: Secondary | ICD-10-CM | POA: Diagnosis not present

## 2018-10-12 DIAGNOSIS — Z992 Dependence on renal dialysis: Secondary | ICD-10-CM | POA: Diagnosis not present

## 2018-10-12 DIAGNOSIS — D631 Anemia in chronic kidney disease: Secondary | ICD-10-CM | POA: Diagnosis not present

## 2018-10-12 DIAGNOSIS — Z23 Encounter for immunization: Secondary | ICD-10-CM | POA: Diagnosis not present

## 2018-10-12 DIAGNOSIS — D509 Iron deficiency anemia, unspecified: Secondary | ICD-10-CM | POA: Diagnosis not present

## 2018-10-12 DIAGNOSIS — N186 End stage renal disease: Secondary | ICD-10-CM | POA: Diagnosis not present

## 2018-10-13 DIAGNOSIS — Z794 Long term (current) use of insulin: Secondary | ICD-10-CM | POA: Diagnosis not present

## 2018-10-13 DIAGNOSIS — B351 Tinea unguium: Secondary | ICD-10-CM | POA: Diagnosis not present

## 2018-10-13 DIAGNOSIS — E1140 Type 2 diabetes mellitus with diabetic neuropathy, unspecified: Secondary | ICD-10-CM | POA: Diagnosis not present

## 2018-10-13 NOTE — Progress Notes (Signed)
Cardiology Office Note Date:  10/17/2018  Patient ID:  [name], [name] 05/11/1966, MRN [id number] PCP:  Mikey College, NP  Cardiologist:  Dr. Fletcher Anon, MD    Chief Complaint: Follow up  History of Present Illness: [name] is a 52 y.o. female with history of ESRD on HD for 3 years, COPD due to tobacco abuse, DM since 1989, HTN, migraine disorder, hepatitis C, HLD, PVD, anxiety, and depression who presents for follow up of dizziness.  She had previously been followed by Dr. Ubaldo Glassing in 2017 for atypical chest pain and underwent an echo and stress test at that time that were both unrevealing with an EF of 50-55%. Patient was initially evaluated by Dr. Fletcher Anon on 05/31/2018 for dizziness and presyncope. At that time, she noted intermittent episodes of a heaviness in her head and shoulders followed by extreme dizziness. She was concerned her BP may be running low during these episodes, but had not checked it. There was also mention of increased palpitations. She denied any chest pain, though did note worsening exertional dyspnea. These episodes were not exclusively associated with her HD sessions. EKG showed NSR, left atrial enlargement, and LVH. She was not orthostatic. She was advised to bring in her BP readings from home at her next follow up. Initial Zio monitor showed the patient only wore the monitor for 1 day and 13 hours and did not show any significant arrhythmia. Given she reported ongoing symptoms it was replaced x 4 days and showed NSR with an average heart rate of 74 bpm with rare PACs and PVCs with no significant arrhythmia. Echo on 06/09/2018 showed an EF of 55-60%, no RWMA, Gr1DD, mild AI, mild to moderate MR, moderately dilated LA, mild to moderate TR, PASP 36 mmHg. She was seen in the office on 07/07/18 for follow up and felt about the same as when she was last seen. She indicated she has a long history of chronic diarrhea with multiple watery bowel movements daily with associated  orthostasis. Blood pressure readings at home ranged from the 1 teens to 749S systolic. She was self titrating her short acting diltiazem 120 mg. It was felt her symptoms were exacerbated by HD and chronic watery diarrhea with possible dumping syndrome. She was changed from long-acting diltiazem to short-acting diltiazem 60 mg tid with instructions to hold on AM of HD as well as hold parameters of 496 mmHg systolic.   Patient comes in today doing reasonably well and feels improved overall following the change from long-acting diltiazem to short acting diltiazem as above.  She has not been taking diltiazem on the morning of her dialysis.  In this setting, blood pressure is frequently in the 200s over 90s to 120s when she arrives to the dialysis center on Mondays, Wednesdays, and Fridays.  She has intermittently been asked to take as needed clonidine at the dialysis center secondary to her BP readings.  With this, she will continue to drop her pressures late in her dialysis session with associated presyncope.  No chest pain, shortness of breath.  Chronic diarrhea has resolved.  Of note, BP of 759 systolic in the office today with patient taking 60 mg of diltiazem this morning as she has multiple errands to run prior to going to the dialysis center later today.  She also notes that she has offloaded some significant stressors which she hopes will improve her BP moving forward.  Past Medical History:  Diagnosis Date  . Allergy   . Anemia   .  Anxiety    worse when not at home - bowel incontinence  . Arthritis   . Ataxia   . COPD (chronic obstructive pulmonary disease) ([place])   . Depression   . Diabetes mellitus with complication ([place])   . ESRD on hemodialysis ([place])    MWF dialysis  . Fistula    left upper arm  . Hepatitis 2003   Hep C  . Hiatal hernia   . Hypercholesterolemia   . Hypertension   . Migraine   . Neuropathy, diabetic ([facility])    lower legs  . Peripheral vascular disease ([place])   .  Pneumonia 2015  . Psoriasis   . Tobacco dependence   . Wears dentures    full upper, partial lower    Past Surgical History:  Procedure Laterality Date  . A/V SHUNT INTERVENTION N/A 12/16/2017   Procedure: A/V SHUNT INTERVENTION;  Surgeon: Algernon Huxley, MD;  Location: [facility];  Service: Cardiovascular;  Laterality: N/A;  . AV FISTULA PLACEMENT Left 11/2014  . CESAREAN SECTION    . CHOLECYSTECTOMY    . COLONOSCOPY N/A 09/22/2017   Procedure: COLONOSCOPY;  Surgeon: Lin Landsman, MD;  Location: [facility];  Service: Endoscopy;  Laterality: N/A;  . cyst removed  from left hand Left 1989  . DIALYSIS/PERMA CATHETER INSERTION N/A 05/20/2017   Procedure: Dialysis/Perma Catheter Insertion and fistulagram/LUE angiogram;  Surgeon: Algernon Huxley, MD;  Location: [facility];  Service: Cardiovascular;  Laterality: N/A;  . ESOPHAGOGASTRODUODENOSCOPY N/A 09/22/2017   Procedure: ESOPHAGOGASTRODUODENOSCOPY (EGD);  Surgeon: Lin Landsman, MD;  Location: [facility];  Service: Endoscopy;  Laterality: N/A;  . INCISION AND DRAINAGE ABSCESS N/A 07/16/2016   Procedure: INCISION AND DRAINAGE ABSCESS;  Surgeon: Carloyn Manner, MD;  Location: [facility];  Service: ENT;  Laterality: N/A;  . PERIPHERAL VASCULAR CATHETERIZATION N/A 07/25/2015   Procedure: A/V Shuntogram/Fistulagram;  Surgeon: Algernon Huxley, MD;  Location: [facility];  Service: Cardiovascular;  Laterality: N/A;  . PERIPHERAL VASCULAR CATHETERIZATION Left 07/25/2015   Procedure: A/V Shunt Intervention;  Surgeon: Algernon Huxley, MD;  Location: [facility];  Service: Cardiovascular;  Laterality: Left;  . PERIPHERAL VASCULAR CATHETERIZATION Left 10/07/2015   Procedure: A/V Shuntogram/Fistulagram;  Surgeon: Algernon Huxley, MD;  Location: [facility];  Service: Cardiovascular;  Laterality: Left;  . PERIPHERAL VASCULAR CATHETERIZATION N/A 10/07/2015   Procedure: A/V Shunt Intervention;  Surgeon:  Algernon Huxley, MD;  Location: [facility];  Service: Cardiovascular;  Laterality: N/A;  . PERIPHERAL VASCULAR CATHETERIZATION  10/07/2015   Procedure: Dialysis/Perma Catheter Insertion;  Surgeon: Algernon Huxley, MD;  Location: [facility];  Service: Cardiovascular;;  . PERIPHERAL VASCULAR CATHETERIZATION N/A 12/17/2015   Procedure: Dialysis/Perma Catheter Removal;  Surgeon: Katha Cabal, MD;  Location: [facility];  Service: Cardiovascular;  Laterality: N/A;  . PERIPHERAL VASCULAR CATHETERIZATION N/A 01/11/2017   Procedure: Visceral Angiography;  Surgeon: Algernon Huxley, MD;  Location: [facility];  Service: Cardiovascular;  Laterality: N/A;  . PERIPHERAL VASCULAR CATHETERIZATION N/A 01/11/2017   Procedure: Visceral Artery Intervention;  Surgeon: Algernon Huxley, MD;  Location: [facility];  Service: Cardiovascular;  Laterality: N/A;  . POLYPECTOMY  09/22/2017   Procedure: POLYPECTOMY;  Surgeon: Lin Landsman, MD;  Location: [facility];  Service: Endoscopy;;  . rt. tubal and ovary removed    . STENT PLACEMENT VASCULAR ([facility] HX) Left 03/2018  Performed at [facility] using EV3 protg GPS stent graph [id number] lot [id number]  . TONSILLECTOMY    . TUBAL LIGATION      Current Meds  Medication Sig  . Adalimumab (HUMIRA PEN [place]) Inject into the skin every 14 (fourteen) days.  Marland Kitchen atorvastatin (LIPITOR) 20 MG tablet Take 1 tablet (20 mg total) by mouth daily.  . calcium acetate (PHOSLO) 667 MG capsule Take 1,334 mg by mouth 3 (three) times daily with meals.   . clobetasol (OLUX) 0.05 % topical foam Apply topically 2 (two) times daily.  . clobetasol ointment (TEMOVATE) 5.02 % Apply 1 application topically daily as needed (for psorasis).   Marland Kitchen diltiazem (CARDIZEM) 60 MG tablet Take 1 tablet (60 mg total) by mouth 3 (three) times daily.  Marland Kitchen glucose blood (TRUE METRIX BLOOD GLUCOSE TEST) test strip Use as instructed  . insulin aspart  (NOVOLOG) 100 UNIT/ML injection Inject 1-2 Units into the skin 3 (three) times daily before meals.  . insulin glargine (LANTUS) 100 UNIT/ML injection Inject 15 Units into the skin daily.   Marland Kitchen ketoconazole (NIZORAL) 2 % cream APPLY TOPICALLY ONTO THE SKIN TWICE A DAY AS NEEDED  . lactulose (CHRONULAC) 10 GM/15ML solution Take by mouth daily as needed for mild constipation.  Marland Kitchen levocetirizine (XYZAL) 5 MG tablet TAKE 1 TABLET(5 MG) BY MOUTH EVERY EVENING  . loperamide (IMODIUM A-D) 2 MG tablet Take 2 mg by mouth 3 (three) times daily as needed.   . midodrine (PROAMATINE) 5 MG tablet TK 1 T PO  DURING DIALYSIS TREATMENT FOR LOW BP  . ondansetron (ZOFRAN) 8 MG tablet Take by mouth.  . sevelamer carbonate (RENVELA) 800 MG tablet Take 1,600 mg by mouth 3 (three) times daily with meals.  . sodium polystyrene (KAYEXALATE) powder MIX 30 GRAMS (8 LEVEL TEASPOONSFUL OF POWER) IN WATER & DRINK BY MOUTH AS A ONE TIME DOSE  . torsemide (DEMADEX) 100 MG tablet Take 100 mg by mouth daily.  Marland Kitchen ULTICARE INSULIN SYRINGE 29G X 1/2" 0.5 ML MISC USE 4 TIMES A DAY AS DIRECTED PER MD  . VOLTAREN 1 % GEL APPLY 4 GRAMS TOPICALLY QID    Allergies:   Cinnamon; Garlic; Onion; Tylenol [acetaminophen]; Ciprofloxacin; and Prednisone   Social History:  The patient  reports that she has been smoking cigarettes. She has a 5.00 pack-year smoking history. She has never used smokeless tobacco. She reports that she does not drink alcohol or use drugs.   Family History:  The patient's family history includes Heart disease in her mother; Hypertension in her father and mother; Skin cancer in her father.  ROS:   Review of Systems  Constitutional: Positive for malaise/fatigue. Negative for chills, diaphoresis, fever and weight loss.  HENT: Negative for congestion.   Eyes: Negative for discharge and redness.  Respiratory: Negative for cough, hemoptysis, sputum production, shortness of breath and wheezing.   Cardiovascular: Negative for  chest pain, palpitations, orthopnea, claudication, leg swelling and PND.  Gastrointestinal: Negative for abdominal pain, blood in stool, constipation, diarrhea, heartburn, melena, nausea and vomiting.  Genitourinary: Negative for hematuria.  Musculoskeletal: Negative for falls and myalgias.  Skin: Negative for rash.  Neurological: Positive for weakness. Negative for dizziness, tingling, tremors, sensory change, speech change, focal weakness and loss of consciousness.  Endo/Heme/Allergies: Does not bruise/bleed easily.  Psychiatric/Behavioral: Negative for substance abuse. The patient is not nervous/anxious.   All other systems reviewed and are negative.    PHYSICAL EXAM:  VS:  BP 140/62 (BP Location: Right  Arm, Patient Position: Sitting, Cuff Size: Normal)   Pulse 71   Ht 5\' 7"  (1.702 m)   Wt 226 lb 12 oz (102.9 kg)   BMI 35.51 kg/m  BMI: Body mass index is 35.51 kg/m.  Physical Exam  Constitutional: She is oriented to person, place, and time. She appears well-developed and well-nourished.  HENT:  Head: Normocephalic and atraumatic.  Eyes: Right eye exhibits no discharge. Left eye exhibits no discharge.  Neck: Normal range of motion. No JVD present.  Cardiovascular: Normal rate, regular rhythm, S1 normal and S2 normal. Exam reveals no distant heart sounds, no friction rub, no midsystolic click and no opening snap.  Murmur heard. High-pitched blowing holosystolic murmur is present with a grade of 1/6 at the apex. Pulses:      Posterior tibial pulses are 2+ on the right side, and 2+ on the left side.  Pulmonary/Chest: Effort normal and breath sounds normal. No respiratory distress. She has no decreased breath sounds. She has no wheezes. She has no rales. She exhibits no tenderness.  Abdominal: Soft. She exhibits no distension. There is no tenderness.  Musculoskeletal: She exhibits no edema.  Neurological: She is alert and oriented to person, place, and time.  Skin: Skin is warm and  dry. No cyanosis. Nails show no clubbing.  Psychiatric: She has a normal mood and affect. Her speech is normal and behavior is normal. Judgment and thought content normal.     EKG:  Was ordered and interpreted by me today. Shows NSR, 71 bpm, LVH, no acute st/t changes  Recent Labs: 12/14/2017: Magnesium 2.4 12/27/2017: BUN 75; Sodium 133 04/07/2018: ALT 13; Platelets 193 06/13/2018: Creatinine 7.0 06/27/2018: Hemoglobin 11.2 07/15/2018: Potassium 5.2  No results found for requested labs within last 8760 hours.   CrCl cannot be calculated (Patient's most recent lab result is older than the maximum 21 days allowed.).   Wt Readings from Last 3 Encounters:  10/17/18 226 lb 12 oz (102.9 kg)  08/02/18 221 lb 12.8 oz (100.6 kg)  07/07/18 220 lb 12 oz (100.1 kg)     Other studies reviewed: Additional studies/records reviewed today include: summarized above  ASSESSMENT AND PLAN:  1. Dizziness/presyncope: Much improved following transitioning from long-acting diltiazem to short-acting.  Now BP readings are elevated in the 200s over 90s to 120s on dialysis days given no antihypertensive therapy those mornings.  She continues to have drops and BP during dialysis.  Intermittently, she has had to take as needed clonidine at the dialysis center secondary to hypertension.  We will have her take short acting diltiazem 30 mg on the morning of dialysis sessions typically around 7 AM (patient undergoes dialysis around noon).  On her nondialysis days she will continue diltiazem 60 mg 3 times daily.  If pressure continues to drop significantly on dialysis days we may need to consider midodrine.  2. HTN: Blood pressure is reasonably controlled today.  As above.  3. Dyspnea: Resolved.  4. Mitral regurgitation: Periodic evaluation with echo.   5. ESRD: Managed by nephrology.   6. Chronic diarrhea: Improved.   Disposition: F/u with Dr. Fletcher Anon or an APP in 3 months.   Current medicines are reviewed at length  with the patient today.  The patient did not have any concerns regarding medicines.  Signed, Christell Faith, PA-C 10/17/2018 8:11 AM     [place] [address] [place], [place] [address] [phone]

## 2018-10-14 DIAGNOSIS — D631 Anemia in chronic kidney disease: Secondary | ICD-10-CM | POA: Diagnosis not present

## 2018-10-14 DIAGNOSIS — N2581 Secondary hyperparathyroidism of renal origin: Secondary | ICD-10-CM | POA: Diagnosis not present

## 2018-10-14 DIAGNOSIS — Z992 Dependence on renal dialysis: Secondary | ICD-10-CM | POA: Diagnosis not present

## 2018-10-14 DIAGNOSIS — Z23 Encounter for immunization: Secondary | ICD-10-CM | POA: Diagnosis not present

## 2018-10-14 DIAGNOSIS — D509 Iron deficiency anemia, unspecified: Secondary | ICD-10-CM | POA: Diagnosis not present

## 2018-10-14 DIAGNOSIS — N186 End stage renal disease: Secondary | ICD-10-CM | POA: Diagnosis not present

## 2018-10-17 ENCOUNTER — Encounter: Payer: Self-pay | Admitting: Physician Assistant

## 2018-10-17 ENCOUNTER — Ambulatory Visit (INDEPENDENT_AMBULATORY_CARE_PROVIDER_SITE_OTHER): Payer: Medicare Other | Admitting: Physician Assistant

## 2018-10-17 VITALS — BP 140/62 | HR 71 | Ht 67.0 in | Wt 226.8 lb

## 2018-10-17 DIAGNOSIS — N186 End stage renal disease: Secondary | ICD-10-CM | POA: Diagnosis not present

## 2018-10-17 DIAGNOSIS — Z992 Dependence on renal dialysis: Secondary | ICD-10-CM

## 2018-10-17 DIAGNOSIS — I10 Essential (primary) hypertension: Secondary | ICD-10-CM | POA: Diagnosis not present

## 2018-10-17 DIAGNOSIS — R55 Syncope and collapse: Secondary | ICD-10-CM | POA: Diagnosis not present

## 2018-10-17 DIAGNOSIS — R42 Dizziness and giddiness: Secondary | ICD-10-CM | POA: Diagnosis not present

## 2018-10-17 DIAGNOSIS — E119 Type 2 diabetes mellitus without complications: Secondary | ICD-10-CM | POA: Diagnosis not present

## 2018-10-17 DIAGNOSIS — Z23 Encounter for immunization: Secondary | ICD-10-CM | POA: Diagnosis not present

## 2018-10-17 DIAGNOSIS — I340 Nonrheumatic mitral (valve) insufficiency: Secondary | ICD-10-CM | POA: Diagnosis not present

## 2018-10-17 DIAGNOSIS — Z794 Long term (current) use of insulin: Secondary | ICD-10-CM | POA: Diagnosis not present

## 2018-10-17 DIAGNOSIS — D509 Iron deficiency anemia, unspecified: Secondary | ICD-10-CM | POA: Diagnosis not present

## 2018-10-17 DIAGNOSIS — D631 Anemia in chronic kidney disease: Secondary | ICD-10-CM | POA: Diagnosis not present

## 2018-10-17 DIAGNOSIS — N2581 Secondary hyperparathyroidism of renal origin: Secondary | ICD-10-CM | POA: Diagnosis not present

## 2018-10-17 MED ORDER — DILTIAZEM HCL 30 MG PO TABS: ORAL_TABLET | ORAL | 2 refills | Status: DC

## 2018-10-17 NOTE — Patient Instructions (Signed)
Medication Instructions:  Your physician has recommended you make the following change in your medication:  1- On Monday, Wednesday and Friday - TAKE Diltiazem 30 mg by mouth once a day. 2- On non-Dialysis days -TAKE Diltiazem 60 mg by mouth once a day.  If you need a refill on your cardiac medications before your next appointment, please call your pharmacy.   Lab work: None  If you have labs (blood work) drawn today and your tests are completely normal, you will receive your results only by: Marland Kitchen MyChart Message (if you have MyChart) OR . A paper copy in the mail If you have any lab test that is abnormal or we need to change your treatment, we will call you to review the results.  Testing/Procedures: none  Follow-Up: At [facility], you and your health needs are our priority.  As part of our continuing mission to provide you with exceptional heart care, we have created designated Provider Care Teams.  These Care Teams include your primary Cardiologist (physician) and Advanced Practice Providers (APPs -  Physician Assistants and Nurse Practitioners) who all work together to provide you with the care you need, when you need it. You will need a follow up appointment in 3 months.  You may see Dr Kathlyn Sacramento  or one of the following Advanced Practice Providers on your designated Care Team:   Murray Hodgkins, NP Christell Faith, PA-C . Marrianne Mood, PA-C

## 2018-10-19 ENCOUNTER — Encounter: Payer: Self-pay | Admitting: Nurse Practitioner

## 2018-10-19 DIAGNOSIS — N2581 Secondary hyperparathyroidism of renal origin: Secondary | ICD-10-CM | POA: Diagnosis not present

## 2018-10-19 DIAGNOSIS — N186 End stage renal disease: Secondary | ICD-10-CM | POA: Diagnosis not present

## 2018-10-19 DIAGNOSIS — Z23 Encounter for immunization: Secondary | ICD-10-CM | POA: Diagnosis not present

## 2018-10-19 DIAGNOSIS — Z992 Dependence on renal dialysis: Secondary | ICD-10-CM | POA: Diagnosis not present

## 2018-10-19 DIAGNOSIS — D631 Anemia in chronic kidney disease: Secondary | ICD-10-CM | POA: Diagnosis not present

## 2018-10-19 DIAGNOSIS — D509 Iron deficiency anemia, unspecified: Secondary | ICD-10-CM | POA: Diagnosis not present

## 2018-10-20 DIAGNOSIS — Z992 Dependence on renal dialysis: Secondary | ICD-10-CM | POA: Diagnosis not present

## 2018-10-20 DIAGNOSIS — N186 End stage renal disease: Secondary | ICD-10-CM | POA: Diagnosis not present

## 2018-10-21 DIAGNOSIS — D631 Anemia in chronic kidney disease: Secondary | ICD-10-CM | POA: Diagnosis not present

## 2018-10-21 DIAGNOSIS — D509 Iron deficiency anemia, unspecified: Secondary | ICD-10-CM | POA: Diagnosis not present

## 2018-10-21 DIAGNOSIS — N186 End stage renal disease: Secondary | ICD-10-CM | POA: Diagnosis not present

## 2018-10-21 DIAGNOSIS — N2581 Secondary hyperparathyroidism of renal origin: Secondary | ICD-10-CM | POA: Diagnosis not present

## 2018-10-21 DIAGNOSIS — Z992 Dependence on renal dialysis: Secondary | ICD-10-CM | POA: Diagnosis not present

## 2018-10-24 DIAGNOSIS — Z992 Dependence on renal dialysis: Secondary | ICD-10-CM | POA: Diagnosis not present

## 2018-10-24 DIAGNOSIS — N186 End stage renal disease: Secondary | ICD-10-CM | POA: Diagnosis not present

## 2018-10-24 DIAGNOSIS — D631 Anemia in chronic kidney disease: Secondary | ICD-10-CM | POA: Diagnosis not present

## 2018-10-24 DIAGNOSIS — D509 Iron deficiency anemia, unspecified: Secondary | ICD-10-CM | POA: Diagnosis not present

## 2018-10-24 DIAGNOSIS — N2581 Secondary hyperparathyroidism of renal origin: Secondary | ICD-10-CM | POA: Diagnosis not present

## 2018-10-25 DIAGNOSIS — E113512 Type 2 diabetes mellitus with proliferative diabetic retinopathy with macular edema, left eye: Secondary | ICD-10-CM | POA: Diagnosis not present

## 2018-10-26 DIAGNOSIS — N2581 Secondary hyperparathyroidism of renal origin: Secondary | ICD-10-CM | POA: Diagnosis not present

## 2018-10-26 DIAGNOSIS — D631 Anemia in chronic kidney disease: Secondary | ICD-10-CM | POA: Diagnosis not present

## 2018-10-26 DIAGNOSIS — N186 End stage renal disease: Secondary | ICD-10-CM | POA: Diagnosis not present

## 2018-10-26 DIAGNOSIS — Z992 Dependence on renal dialysis: Secondary | ICD-10-CM | POA: Diagnosis not present

## 2018-10-26 DIAGNOSIS — D509 Iron deficiency anemia, unspecified: Secondary | ICD-10-CM | POA: Diagnosis not present

## 2018-10-28 DIAGNOSIS — Z992 Dependence on renal dialysis: Secondary | ICD-10-CM | POA: Diagnosis not present

## 2018-10-28 DIAGNOSIS — N2581 Secondary hyperparathyroidism of renal origin: Secondary | ICD-10-CM | POA: Diagnosis not present

## 2018-10-28 DIAGNOSIS — N186 End stage renal disease: Secondary | ICD-10-CM | POA: Diagnosis not present

## 2018-10-28 DIAGNOSIS — D509 Iron deficiency anemia, unspecified: Secondary | ICD-10-CM | POA: Diagnosis not present

## 2018-10-28 DIAGNOSIS — D631 Anemia in chronic kidney disease: Secondary | ICD-10-CM | POA: Diagnosis not present

## 2018-10-29 IMAGING — CT CT HEAD W/O CM
3 series · 15 of 47 positions shown, 18 images · non-contrast
Comparison: 07/14/2016

CLINICAL DATA: Headache, on dialysis, vomited today

EXAM:
CT HEAD WITHOUT CONTRAST
TECHNIQUE: Contiguous axial images were obtained from the base of the skull
through the vertex without intravenous contrast.

[Series 2: head wo · axial · 0.44mm/px · z∈[+1217,+1342]mm · 9 of 30 slices shown, 12 images]
[im 3/30  brain]
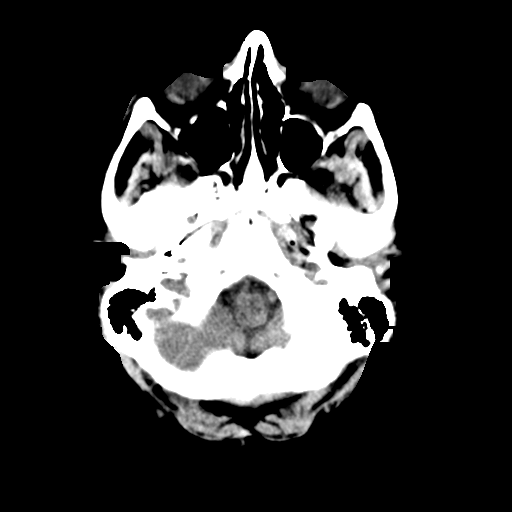
[im 3/30  bone]
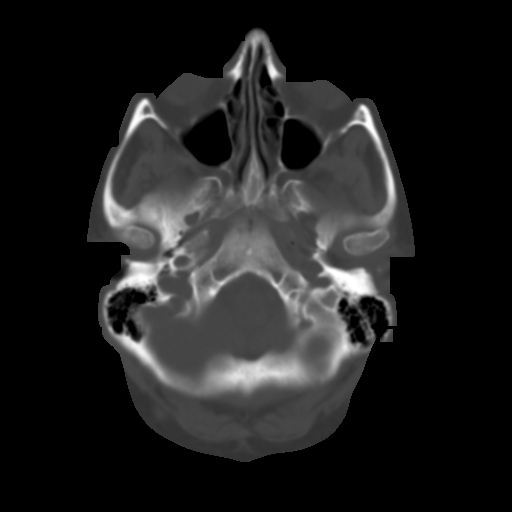
[im 6/30  brain]
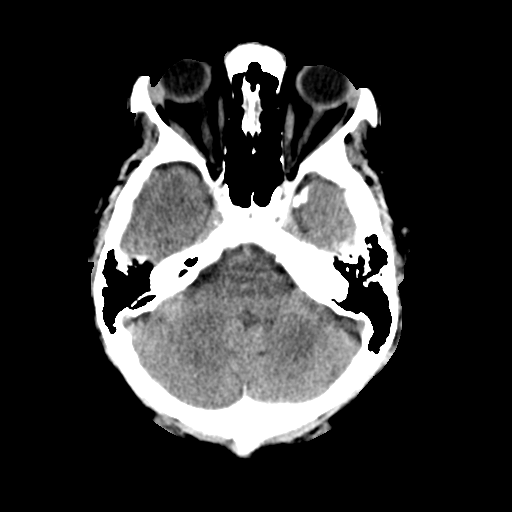
[im 9/30  brain]
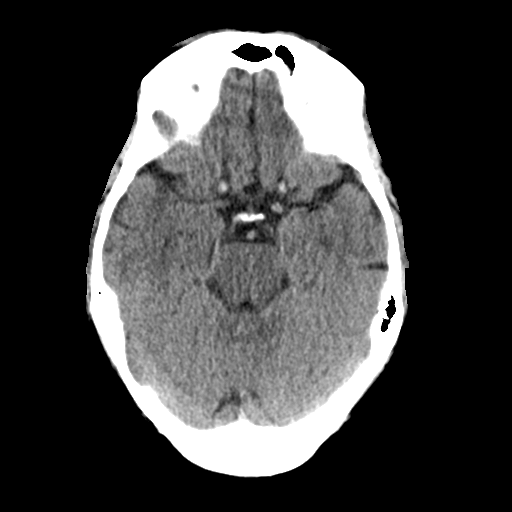
[im 12/30  brain]
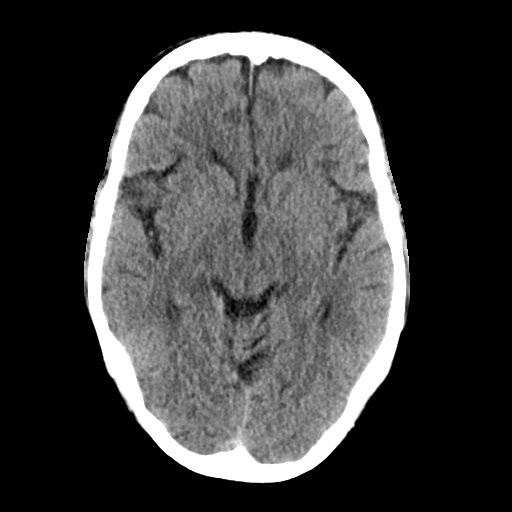
[im 16/30  brain]
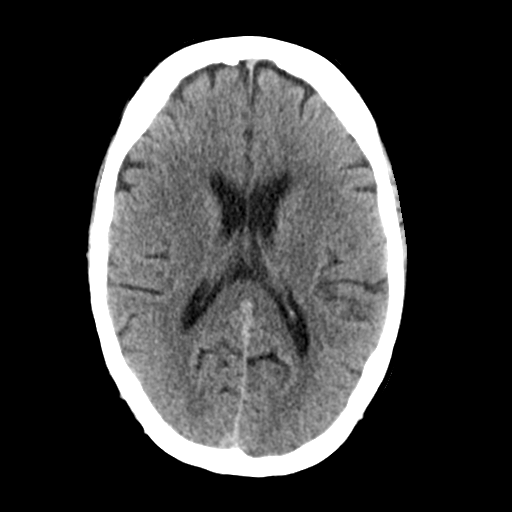
[im 16/30  bone]
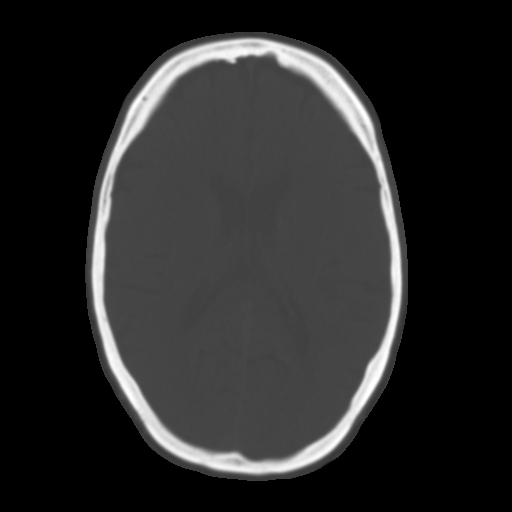
[im 19/30  brain]
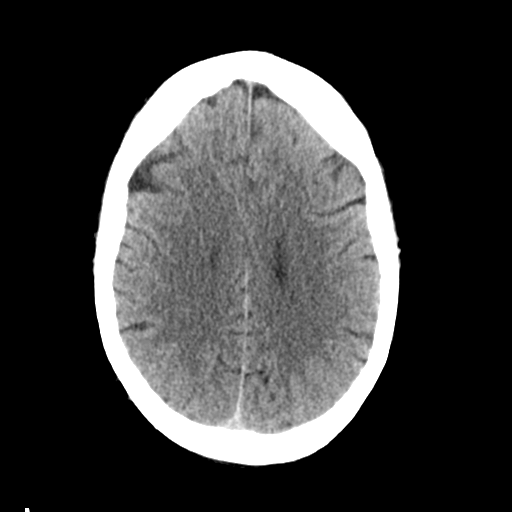
[im 22/30  brain]
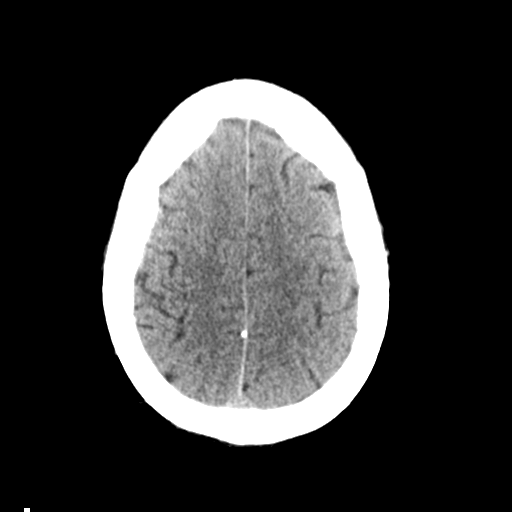
[im 25/30  brain]
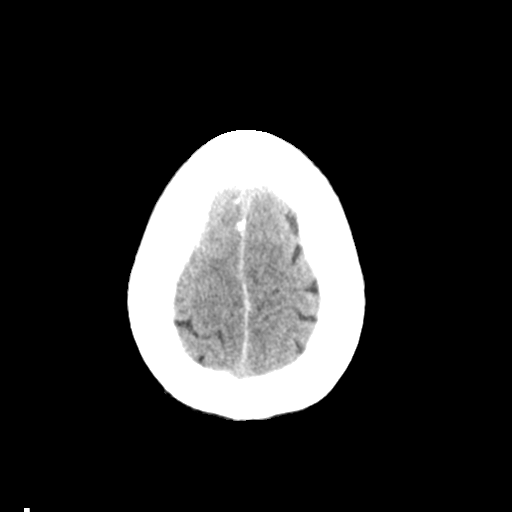
[im 28/30  brain]
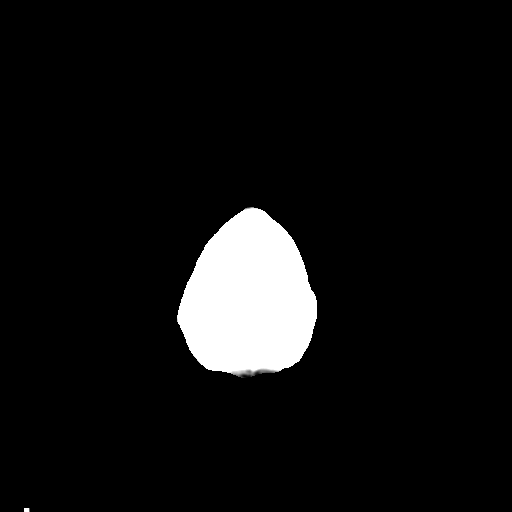
[im 28/30  bone]
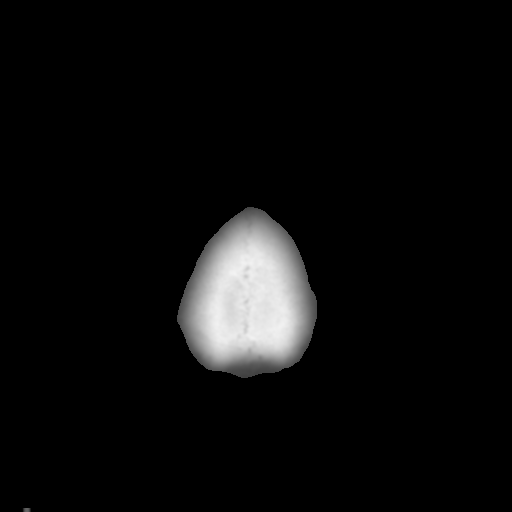

[Series 4: coronal soft tissue · coronal · 0.31mm/px · 3 of 67 slices shown]
[im 23/67  brain]
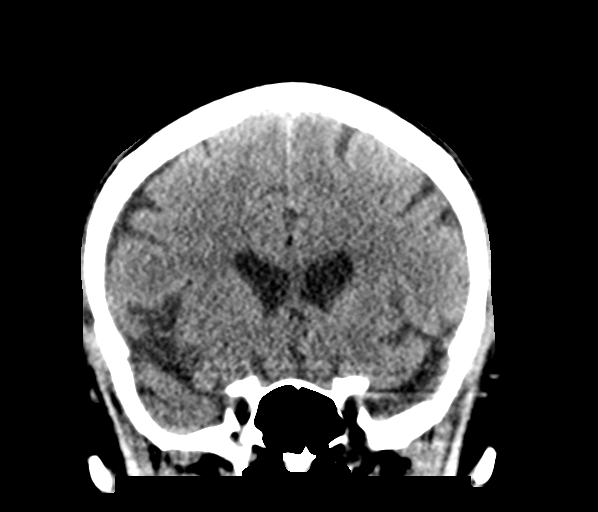
[im 30/67  brain]
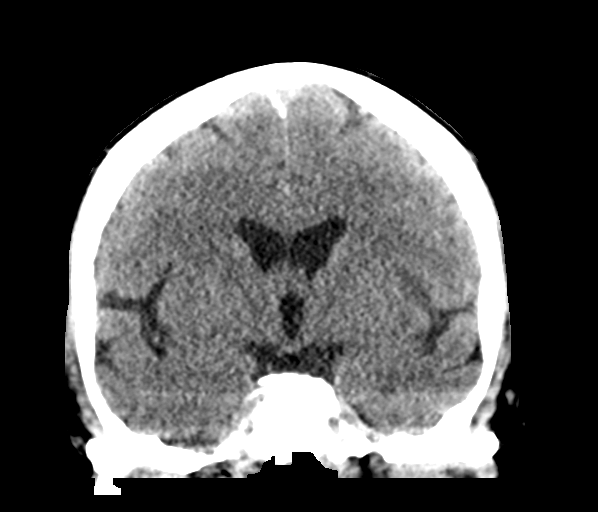
[im 37/67  brain]
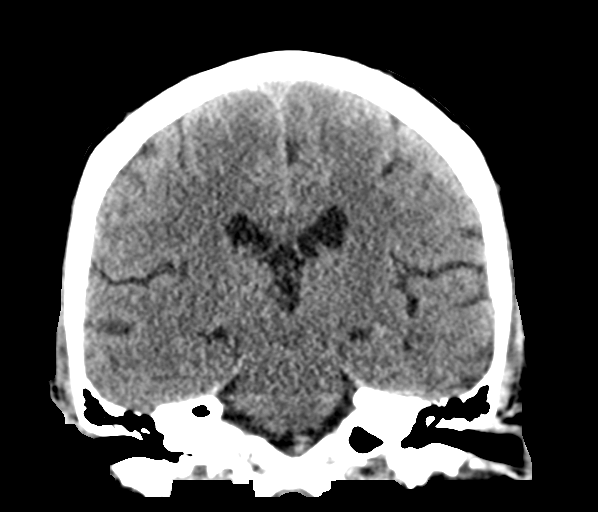

[Series 5: sagittal soft tissue · sagittal · 0.30mm/px · 3 of 48 slices shown]
[im 16/48  brain]
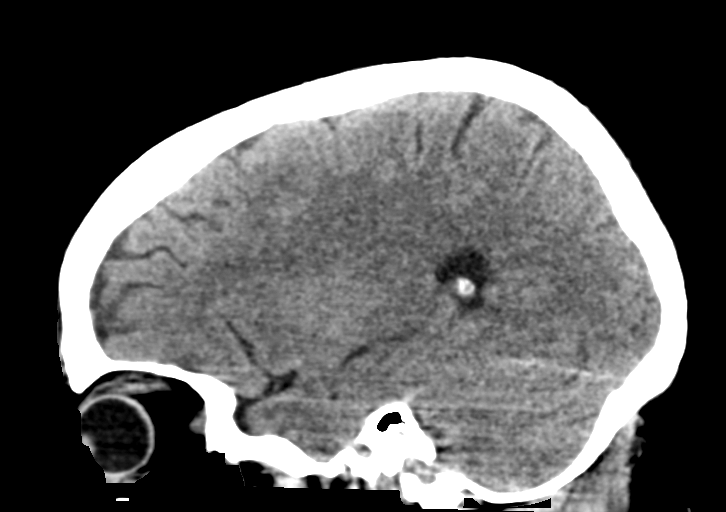
[im 24/48  brain]
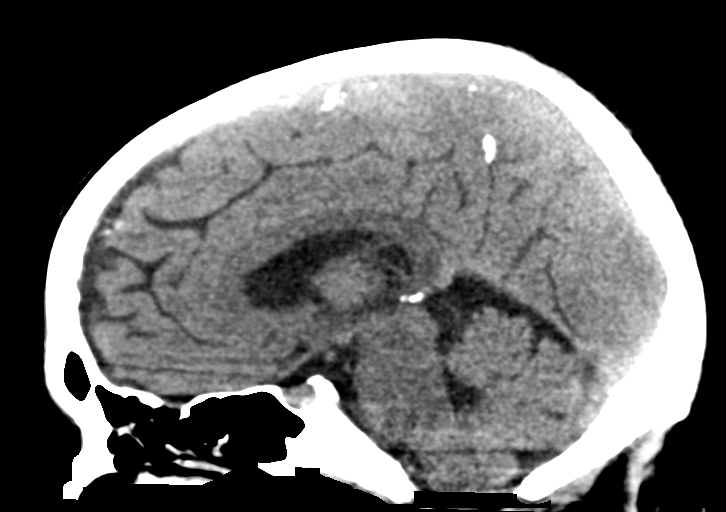
[im 32/48  brain]
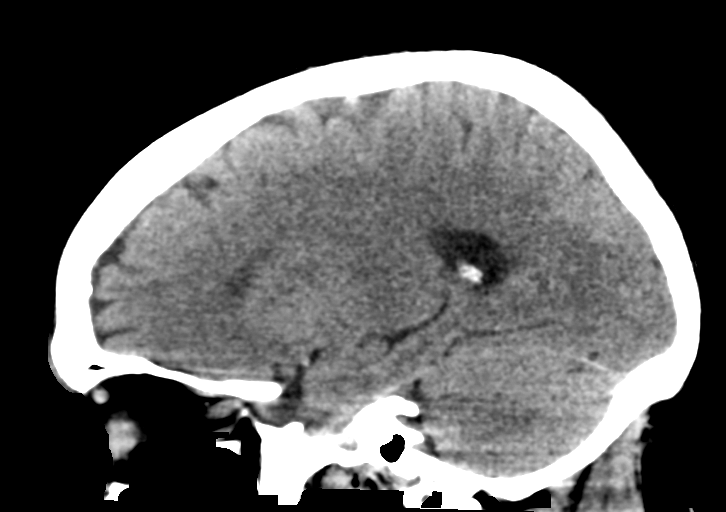

[15 of 47 positions shown; findings below may reference images not displayed]

FINDINGS: Brain: No intracranial hemorrhage, mass effect or midline shift. No
acute cortical infarction. No mass lesion is noted on this
unenhanced scan.

Vascular: Mild atherosclerotic calcifications of carotid siphon.

Skull: No skull fracture.

Sinuses/Orbits: No acute findings.

Other: None
IMPRESSION: No acute intracranial abnormality. No definite acute cortical
infarction.

## 2018-10-31 DIAGNOSIS — N186 End stage renal disease: Secondary | ICD-10-CM | POA: Diagnosis not present

## 2018-10-31 DIAGNOSIS — D509 Iron deficiency anemia, unspecified: Secondary | ICD-10-CM | POA: Diagnosis not present

## 2018-10-31 DIAGNOSIS — Z992 Dependence on renal dialysis: Secondary | ICD-10-CM | POA: Diagnosis not present

## 2018-10-31 DIAGNOSIS — N2581 Secondary hyperparathyroidism of renal origin: Secondary | ICD-10-CM | POA: Diagnosis not present

## 2018-10-31 DIAGNOSIS — D631 Anemia in chronic kidney disease: Secondary | ICD-10-CM | POA: Diagnosis not present

## 2018-11-02 DIAGNOSIS — D631 Anemia in chronic kidney disease: Secondary | ICD-10-CM | POA: Diagnosis not present

## 2018-11-02 DIAGNOSIS — N186 End stage renal disease: Secondary | ICD-10-CM | POA: Diagnosis not present

## 2018-11-02 DIAGNOSIS — N2581 Secondary hyperparathyroidism of renal origin: Secondary | ICD-10-CM | POA: Diagnosis not present

## 2018-11-02 DIAGNOSIS — Z992 Dependence on renal dialysis: Secondary | ICD-10-CM | POA: Diagnosis not present

## 2018-11-02 DIAGNOSIS — D509 Iron deficiency anemia, unspecified: Secondary | ICD-10-CM | POA: Diagnosis not present

## 2018-11-04 DIAGNOSIS — D631 Anemia in chronic kidney disease: Secondary | ICD-10-CM | POA: Diagnosis not present

## 2018-11-04 DIAGNOSIS — Z992 Dependence on renal dialysis: Secondary | ICD-10-CM | POA: Diagnosis not present

## 2018-11-04 DIAGNOSIS — D509 Iron deficiency anemia, unspecified: Secondary | ICD-10-CM | POA: Diagnosis not present

## 2018-11-04 DIAGNOSIS — N2581 Secondary hyperparathyroidism of renal origin: Secondary | ICD-10-CM | POA: Diagnosis not present

## 2018-11-04 DIAGNOSIS — N186 End stage renal disease: Secondary | ICD-10-CM | POA: Diagnosis not present

## 2018-11-07 DIAGNOSIS — D509 Iron deficiency anemia, unspecified: Secondary | ICD-10-CM | POA: Diagnosis not present

## 2018-11-07 DIAGNOSIS — D631 Anemia in chronic kidney disease: Secondary | ICD-10-CM | POA: Diagnosis not present

## 2018-11-07 DIAGNOSIS — Z992 Dependence on renal dialysis: Secondary | ICD-10-CM | POA: Diagnosis not present

## 2018-11-07 DIAGNOSIS — N186 End stage renal disease: Secondary | ICD-10-CM | POA: Diagnosis not present

## 2018-11-07 DIAGNOSIS — N2581 Secondary hyperparathyroidism of renal origin: Secondary | ICD-10-CM | POA: Diagnosis not present

## 2018-11-09 DIAGNOSIS — Z992 Dependence on renal dialysis: Secondary | ICD-10-CM | POA: Diagnosis not present

## 2018-11-09 DIAGNOSIS — N186 End stage renal disease: Secondary | ICD-10-CM | POA: Diagnosis not present

## 2018-11-09 DIAGNOSIS — D631 Anemia in chronic kidney disease: Secondary | ICD-10-CM | POA: Diagnosis not present

## 2018-11-09 DIAGNOSIS — N2581 Secondary hyperparathyroidism of renal origin: Secondary | ICD-10-CM | POA: Diagnosis not present

## 2018-11-09 DIAGNOSIS — D509 Iron deficiency anemia, unspecified: Secondary | ICD-10-CM | POA: Diagnosis not present

## 2018-11-11 DIAGNOSIS — D509 Iron deficiency anemia, unspecified: Secondary | ICD-10-CM | POA: Diagnosis not present

## 2018-11-11 DIAGNOSIS — D631 Anemia in chronic kidney disease: Secondary | ICD-10-CM | POA: Diagnosis not present

## 2018-11-11 DIAGNOSIS — N2581 Secondary hyperparathyroidism of renal origin: Secondary | ICD-10-CM | POA: Diagnosis not present

## 2018-11-11 DIAGNOSIS — N186 End stage renal disease: Secondary | ICD-10-CM | POA: Diagnosis not present

## 2018-11-11 DIAGNOSIS — Z992 Dependence on renal dialysis: Secondary | ICD-10-CM | POA: Diagnosis not present

## 2018-11-14 DIAGNOSIS — Z992 Dependence on renal dialysis: Secondary | ICD-10-CM | POA: Diagnosis not present

## 2018-11-14 DIAGNOSIS — N186 End stage renal disease: Secondary | ICD-10-CM | POA: Diagnosis not present

## 2018-11-14 DIAGNOSIS — D631 Anemia in chronic kidney disease: Secondary | ICD-10-CM | POA: Diagnosis not present

## 2018-11-14 DIAGNOSIS — N2581 Secondary hyperparathyroidism of renal origin: Secondary | ICD-10-CM | POA: Diagnosis not present

## 2018-11-14 DIAGNOSIS — D509 Iron deficiency anemia, unspecified: Secondary | ICD-10-CM | POA: Diagnosis not present

## 2018-11-16 DIAGNOSIS — N186 End stage renal disease: Secondary | ICD-10-CM | POA: Diagnosis not present

## 2018-11-16 DIAGNOSIS — Z992 Dependence on renal dialysis: Secondary | ICD-10-CM | POA: Diagnosis not present

## 2018-11-16 DIAGNOSIS — D509 Iron deficiency anemia, unspecified: Secondary | ICD-10-CM | POA: Diagnosis not present

## 2018-11-16 DIAGNOSIS — D631 Anemia in chronic kidney disease: Secondary | ICD-10-CM | POA: Diagnosis not present

## 2018-11-16 DIAGNOSIS — N2581 Secondary hyperparathyroidism of renal origin: Secondary | ICD-10-CM | POA: Diagnosis not present

## 2018-11-18 DIAGNOSIS — N2581 Secondary hyperparathyroidism of renal origin: Secondary | ICD-10-CM | POA: Diagnosis not present

## 2018-11-18 DIAGNOSIS — Z992 Dependence on renal dialysis: Secondary | ICD-10-CM | POA: Diagnosis not present

## 2018-11-18 DIAGNOSIS — N186 End stage renal disease: Secondary | ICD-10-CM | POA: Diagnosis not present

## 2018-11-18 DIAGNOSIS — D631 Anemia in chronic kidney disease: Secondary | ICD-10-CM | POA: Diagnosis not present

## 2018-11-18 DIAGNOSIS — D509 Iron deficiency anemia, unspecified: Secondary | ICD-10-CM | POA: Diagnosis not present

## 2018-11-19 DIAGNOSIS — N186 End stage renal disease: Secondary | ICD-10-CM | POA: Diagnosis not present

## 2018-11-19 DIAGNOSIS — Z992 Dependence on renal dialysis: Secondary | ICD-10-CM | POA: Diagnosis not present

## 2018-11-21 DIAGNOSIS — D631 Anemia in chronic kidney disease: Secondary | ICD-10-CM | POA: Diagnosis not present

## 2018-11-21 DIAGNOSIS — N2581 Secondary hyperparathyroidism of renal origin: Secondary | ICD-10-CM | POA: Diagnosis not present

## 2018-11-21 DIAGNOSIS — Z992 Dependence on renal dialysis: Secondary | ICD-10-CM | POA: Diagnosis not present

## 2018-11-21 DIAGNOSIS — D509 Iron deficiency anemia, unspecified: Secondary | ICD-10-CM | POA: Diagnosis not present

## 2018-11-21 DIAGNOSIS — N186 End stage renal disease: Secondary | ICD-10-CM | POA: Diagnosis not present

## 2018-11-22 DIAGNOSIS — E113511 Type 2 diabetes mellitus with proliferative diabetic retinopathy with macular edema, right eye: Secondary | ICD-10-CM | POA: Diagnosis not present

## 2018-11-23 DIAGNOSIS — N186 End stage renal disease: Secondary | ICD-10-CM | POA: Diagnosis not present

## 2018-11-23 DIAGNOSIS — Z992 Dependence on renal dialysis: Secondary | ICD-10-CM | POA: Diagnosis not present

## 2018-11-23 DIAGNOSIS — D509 Iron deficiency anemia, unspecified: Secondary | ICD-10-CM | POA: Diagnosis not present

## 2018-11-23 DIAGNOSIS — N2581 Secondary hyperparathyroidism of renal origin: Secondary | ICD-10-CM | POA: Diagnosis not present

## 2018-11-23 DIAGNOSIS — D631 Anemia in chronic kidney disease: Secondary | ICD-10-CM | POA: Diagnosis not present

## 2018-11-24 DIAGNOSIS — L4050 Arthropathic psoriasis, unspecified: Secondary | ICD-10-CM | POA: Diagnosis not present

## 2018-11-24 DIAGNOSIS — M4722 Other spondylosis with radiculopathy, cervical region: Secondary | ICD-10-CM | POA: Diagnosis not present

## 2018-11-24 DIAGNOSIS — M0579 Rheumatoid arthritis with rheumatoid factor of multiple sites without organ or systems involvement: Secondary | ICD-10-CM | POA: Diagnosis not present

## 2018-11-24 DIAGNOSIS — N186 End stage renal disease: Secondary | ICD-10-CM | POA: Diagnosis not present

## 2018-11-24 DIAGNOSIS — Z992 Dependence on renal dialysis: Secondary | ICD-10-CM | POA: Diagnosis not present

## 2018-11-24 DIAGNOSIS — B182 Chronic viral hepatitis C: Secondary | ICD-10-CM | POA: Diagnosis not present

## 2018-11-24 DIAGNOSIS — R3915 Urgency of urination: Secondary | ICD-10-CM | POA: Diagnosis not present

## 2018-11-24 DIAGNOSIS — L409 Psoriasis, unspecified: Secondary | ICD-10-CM | POA: Diagnosis not present

## 2018-11-25 DIAGNOSIS — N186 End stage renal disease: Secondary | ICD-10-CM | POA: Diagnosis not present

## 2018-11-25 DIAGNOSIS — D509 Iron deficiency anemia, unspecified: Secondary | ICD-10-CM | POA: Diagnosis not present

## 2018-11-25 DIAGNOSIS — N2581 Secondary hyperparathyroidism of renal origin: Secondary | ICD-10-CM | POA: Diagnosis not present

## 2018-11-25 DIAGNOSIS — Z992 Dependence on renal dialysis: Secondary | ICD-10-CM | POA: Diagnosis not present

## 2018-11-25 DIAGNOSIS — D631 Anemia in chronic kidney disease: Secondary | ICD-10-CM | POA: Diagnosis not present

## 2018-11-28 DIAGNOSIS — N186 End stage renal disease: Secondary | ICD-10-CM | POA: Diagnosis not present

## 2018-11-28 DIAGNOSIS — D509 Iron deficiency anemia, unspecified: Secondary | ICD-10-CM | POA: Diagnosis not present

## 2018-11-28 DIAGNOSIS — D631 Anemia in chronic kidney disease: Secondary | ICD-10-CM | POA: Diagnosis not present

## 2018-11-28 DIAGNOSIS — Z992 Dependence on renal dialysis: Secondary | ICD-10-CM | POA: Diagnosis not present

## 2018-11-28 DIAGNOSIS — N2581 Secondary hyperparathyroidism of renal origin: Secondary | ICD-10-CM | POA: Diagnosis not present

## 2018-11-30 DIAGNOSIS — D631 Anemia in chronic kidney disease: Secondary | ICD-10-CM | POA: Diagnosis not present

## 2018-11-30 DIAGNOSIS — N186 End stage renal disease: Secondary | ICD-10-CM | POA: Diagnosis not present

## 2018-11-30 DIAGNOSIS — N2581 Secondary hyperparathyroidism of renal origin: Secondary | ICD-10-CM | POA: Diagnosis not present

## 2018-11-30 DIAGNOSIS — Z992 Dependence on renal dialysis: Secondary | ICD-10-CM | POA: Diagnosis not present

## 2018-11-30 DIAGNOSIS — D509 Iron deficiency anemia, unspecified: Secondary | ICD-10-CM | POA: Diagnosis not present

## 2018-12-02 DIAGNOSIS — D631 Anemia in chronic kidney disease: Secondary | ICD-10-CM | POA: Diagnosis not present

## 2018-12-02 DIAGNOSIS — N2581 Secondary hyperparathyroidism of renal origin: Secondary | ICD-10-CM | POA: Diagnosis not present

## 2018-12-02 DIAGNOSIS — N186 End stage renal disease: Secondary | ICD-10-CM | POA: Diagnosis not present

## 2018-12-02 DIAGNOSIS — Z992 Dependence on renal dialysis: Secondary | ICD-10-CM | POA: Diagnosis not present

## 2018-12-02 DIAGNOSIS — D509 Iron deficiency anemia, unspecified: Secondary | ICD-10-CM | POA: Diagnosis not present

## 2018-12-05 ENCOUNTER — Telehealth: Payer: Self-pay | Admitting: Pain Medicine

## 2018-12-05 ENCOUNTER — Telehealth: Payer: Self-pay | Admitting: Student in an Organized Health Care Education/Training Program

## 2018-12-05 DIAGNOSIS — N2581 Secondary hyperparathyroidism of renal origin: Secondary | ICD-10-CM | POA: Diagnosis not present

## 2018-12-05 DIAGNOSIS — Z992 Dependence on renal dialysis: Secondary | ICD-10-CM | POA: Diagnosis not present

## 2018-12-05 DIAGNOSIS — D631 Anemia in chronic kidney disease: Secondary | ICD-10-CM | POA: Diagnosis not present

## 2018-12-05 DIAGNOSIS — D509 Iron deficiency anemia, unspecified: Secondary | ICD-10-CM | POA: Diagnosis not present

## 2018-12-05 DIAGNOSIS — N186 End stage renal disease: Secondary | ICD-10-CM | POA: Diagnosis not present

## 2018-12-05 DIAGNOSIS — M5412 Radiculopathy, cervical region: Secondary | ICD-10-CM

## 2018-12-05 NOTE — Telephone Encounter (Signed)
Please call patient to schedule.

## 2018-12-05 NOTE — Telephone Encounter (Signed)
Pt left a voice mail requesting an appt for CESI but the order is expired. Can Dr. Holley Raring put in a new order so we can get pt scheduled for procedure?

## 2018-12-06 NOTE — Telephone Encounter (Signed)
Schedule 12/07/18 for CESI

## 2018-12-07 ENCOUNTER — Ambulatory Visit (HOSPITAL_BASED_OUTPATIENT_CLINIC_OR_DEPARTMENT_OTHER): Payer: Medicare Other | Admitting: Student in an Organized Health Care Education/Training Program

## 2018-12-07 ENCOUNTER — Ambulatory Visit
Admission: RE | Admit: 2018-12-07 | Discharge: 2018-12-07 | Disposition: A | Discharge status: Home / Self Care | Payer: Medicare Other | Source: Ambulatory Visit | Attending: Student in an Organized Health Care Education/Training Program | Admitting: Student in an Organized Health Care Education/Training Program

## 2018-12-07 ENCOUNTER — Encounter: Payer: Self-pay | Admitting: Student in an Organized Health Care Education/Training Program

## 2018-12-07 ENCOUNTER — Other Ambulatory Visit: Payer: Self-pay

## 2018-12-07 DIAGNOSIS — M5412 Radiculopathy, cervical region: Secondary | ICD-10-CM | POA: Diagnosis not present

## 2018-12-07 MED ORDER — IOPAMIDOL (ISOVUE-M 200) INJECTION 41%
10.0000 mL | Freq: Once | INTRAMUSCULAR | Status: AC
  Administered 2018-12-07: 10 mL via EPIDURAL
  Filled 2018-12-07: qty 10

## 2018-12-07 MED ORDER — LIDOCAINE HCL 2 % IJ SOLN
10.0000 mL | Freq: Once | INTRAMUSCULAR | Status: AC
  Administered 2018-12-07: 400 mg
  Filled 2018-12-07: qty 20

## 2018-12-07 MED ORDER — ROPIVACAINE HCL 2 MG/ML IJ SOLN
1.0000 mL | Freq: Once | INTRAMUSCULAR | Status: AC
  Administered 2018-12-07: 10 mL via EPIDURAL
  Filled 2018-12-07: qty 10

## 2018-12-07 MED ORDER — DEXAMETHASONE SODIUM PHOSPHATE 10 MG/ML IJ SOLN
10.0000 mg | Freq: Once | INTRAMUSCULAR | Status: AC
  Administered 2018-12-07: 10 mg
  Filled 2018-12-07: qty 1

## 2018-12-07 MED ORDER — FENTANYL CITRATE (PF) 100 MCG/2ML IJ SOLN
25.0000 ug | INTRAMUSCULAR | Status: DC | PRN
  Administered 2018-12-07: 50 ug via INTRAVENOUS
  Filled 2018-12-07: qty 2

## 2018-12-07 MED ORDER — LACTATED RINGERS IV SOLN
1000.0000 mL | Freq: Once | INTRAVENOUS | Status: AC
  Administered 2018-12-07: 1000 mL via INTRAVENOUS

## 2018-12-07 MED ORDER — SODIUM CHLORIDE 0.9% FLUSH
1.0000 mL | Freq: Once | INTRAVENOUS | Status: AC
  Administered 2018-12-07: 10 mL

## 2018-12-07 NOTE — Progress Notes (Signed)
Safety precautions to be maintained throughout the outpatient stay will include: orient to surroundings, keep bed in low position, maintain call bell within reach at all times, provide assistance with transfer out of bed and ambulation.  

## 2018-12-07 NOTE — Patient Instructions (Signed)

## 2018-12-07 NOTE — Progress Notes (Signed)
Patient's Name: [name]  MRN: [id number]  Referring Provider: Gillis Santa, MD  DOB: 1966/04/04  PCP: Mikey College, NP  DOS: 12/07/2018  Note by: Gillis Santa, MD  Service setting: Ambulatory outpatient  Specialty: Interventional Pain Management  Patient type: Established  Location: [facility] (AMB) Pain Management Facility  Visit type: Interventional Procedure   Primary Reason for Visit: Interventional Pain Management Treatment. CC: Procedure (cervical epidural injection )  Procedure:          Anesthesia, Analgesia, Anxiolysis:  Type: Diagnostic, Inter-Laminar, Cervical Epidural Steroid Injection  #1  Region: Posterior Cervico-thoracic Region Level: C7-T1 Laterality: Right-Sided Paramedial  Type: Moderate (Conscious) Sedation combined with Local Anesthesia Indication(s): Analgesia and Anxiety Route: Intravenous (IV) IV Access: Secured Sedation: Meaningful verbal contact was maintained at all times during the procedure  Local Anesthetic: Lidocaine 1-2%  Position: Prone with head of the table was raised to facilitate breathing.   Indications: 1. Cervical radiculopathy    Pain Score: Pre-procedure: 10-Worst pain ever/10 Post-procedure: 4 /10  Pre-op Assessment:  [name] is a 52 y.o. (year old), female patient, seen today for interventional treatment. She  has a past surgical history that includes Tonsillectomy; rt. tubal and ovary removed; Cholecystectomy; Cardiac catheterization (N/A, 07/25/2015); Cardiac catheterization (Left, 07/25/2015); Cardiac catheterization (Left, 10/07/2015); Cardiac catheterization (N/A, 10/07/2015); Cardiac catheterization (10/07/2015); Cardiac catheterization (N/A, 12/17/2015); Incision and drainage abscess (N/A, 07/16/2016); cyst removed  from left hand (Left, 1989); AV fistula placement (Left, 11/2014); Cardiac catheterization (N/A, 01/11/2017); Cardiac catheterization (N/A, 01/11/2017); DIALYSIS/PERMA CATHETER INSERTION (N/A, 05/20/2017); Cesarean  section; Tubal ligation; Colonoscopy (N/A, 09/22/2017); Esophagogastroduodenoscopy (N/A, 09/22/2017); polypectomy (09/22/2017); A/V SHUNT INTERVENTION (N/A, 12/16/2017); and STENT PLACEMENT VASCULAR ([facility] HX) (Left, 03/2018). [name] has a current medication list which includes the following prescription(s): adalimumab, atorvastatin, calcium acetate, clobetasol, clobetasol ointment, diltiazem, glucose blood, insulin aspart, insulin glargine, ketoconazole, levocetirizine, loperamide, midodrine, ondansetron, sevelamer carbonate, sodium polystyrene, torsemide, ulticare insulin syringe, and voltaren, and the following Facility-Administered Medications: fentanyl. Her primarily concern today is the Procedure (cervical epidural injection )  Initial Vital Signs:  Pulse/HCG Rate: 71ECG Heart Rate: 71 Temp: 98.7 F (37.1 C) Resp: 18 BP: (!) 181/69 SpO2: 99 %  BMI: Estimated body mass index is 35.08 kg/m as calculated from the following:   Height as of this encounter: 5\' 7"  (1.702 m).   Weight as of this encounter: 224 lb (101.6 kg).  Risk Assessment: Allergies: Reviewed. She is allergic to cinnamon; garlic; onion; tylenol [acetaminophen]; ciprofloxacin; and prednisone.  Allergy Precautions: None required Coagulopathies: Reviewed. None identified.  Blood-thinner therapy: None at this time Active Infection(s): Reviewed. None identified. [name] is afebrile  Site Confirmation: [name] was asked to confirm the procedure and laterality before marking the site Procedure checklist: Completed Consent: Before the procedure and under the influence of no sedative(s), amnesic(s), or anxiolytics, the patient was informed of the treatment options, risks and possible complications. To fulfill our ethical and legal obligations, as recommended by the American Medical Association's Code of Ethics, I have informed the patient of my clinical impression; the nature and purpose of the treatment or procedure; the risks,  benefits, and possible complications of the intervention; the alternatives, including doing nothing; the risk(s) and benefit(s) of the alternative treatment(s) or procedure(s); and the risk(s) and benefit(s) of doing nothing. The patient was provided information about the general risks and possible complications associated with the procedure. These may include, but are not limited to: failure to achieve desired goals, infection, bleeding, organ or nerve damage,  allergic reactions, paralysis, and death. In addition, the patient was informed of those risks and complications associated to Spine-related procedures, such as failure to decrease pain; infection (i.e.: Meningitis, epidural or intraspinal abscess); bleeding (i.e.: epidural hematoma, subarachnoid hemorrhage, or any other type of intraspinal or peri-dural bleeding); organ or nerve damage (i.e.: Any type of peripheral nerve, nerve root, or spinal cord injury) with subsequent damage to sensory, motor, and/or autonomic systems, resulting in permanent pain, numbness, and/or weakness of one or several areas of the body; allergic reactions; (i.e.: anaphylactic reaction); and/or death. Furthermore, the patient was informed of those risks and complications associated with the medications. These include, but are not limited to: allergic reactions (i.e.: anaphylactic or anaphylactoid reaction(s)); adrenal axis suppression; blood sugar elevation that in diabetics may result in ketoacidosis or comma; water retention that in patients with history of congestive heart failure may result in shortness of breath, pulmonary edema, and decompensation with resultant heart failure; weight gain; swelling or edema; medication-induced neural toxicity; particulate matter embolism and blood vessel occlusion with resultant organ, and/or nervous system infarction; and/or aseptic necrosis of one or more joints. Finally, the patient was informed that Medicine is not an exact science;  therefore, there is also the possibility of unforeseen or unpredictable risks and/or possible complications that may result in a catastrophic outcome. The patient indicated having understood very clearly. We have given the patient no guarantees and we have made no promises. Enough time was given to the patient to ask questions, all of which were answered to the patient's satisfaction. Ms. Marrone has indicated that she wanted to continue with the procedure. Attestation: I, the ordering provider, attest that I have discussed with the patient the benefits, risks, side-effects, alternatives, likelihood of achieving goals, and potential problems during recovery for the procedure that I have provided informed consent. Date  Time: 12/07/2018 11:09 AM  Pre-Procedure Preparation:  Monitoring: As per clinic protocol. Respiration, ETCO2, SpO2, BP, heart rate and rhythm monitor placed and checked for adequate function Safety Precautions: Patient was assessed for positional comfort and pressure points before starting the procedure. Time-out: I initiated and conducted the "Time-out" before starting the procedure, as per protocol. The patient was asked to participate by confirming the accuracy of the "Time Out" information. Verification of the correct person, site, and procedure were performed and confirmed by me, the nursing staff, and the patient. "Time-out" conducted as per Joint Commission's Universal Protocol (UP.01.01.01). Time: 1220  Description of Procedure:          Target Area: For Epidural Steroid injections the target is the interlaminar space, initially targeting the lower border of the superior vertebral body lamina. Approach: Paramedial approach. Area Prepped: Entire PosteriorCervical Region Prepping solution: ChloraPrep (2% chlorhexidine gluconate and 70% isopropyl alcohol) Safety Precautions: Aspiration looking for blood return was conducted prior to all injections. At no point did we inject any  substances, as a needle was being advanced. No attempts were made at seeking any paresthesias. Safe injection practices and needle disposal techniques used. Medications properly checked for expiration dates. SDV (single dose vial) medications used. Description of the Procedure: Protocol guidelines were followed. The procedure needle was introduced through the skin, ipsilateral to the reported pain, and advanced to the target area. Bone was contacted and the needle walked caudad, until the lamina was cleared. The epidural space was identified using "loss-of-resistance technique" with 2-3 ml of PF-NaCl (0.9% NSS), in a 5cc LOR glass syringe. Vitals:   12/07/18 1229 12/07/18 1239 12/07/18 1249 12/07/18  1259  BP: (!) 202/72 (!) 237/100 (!) 230/100 (!) 242/104  Pulse:      Resp: 14 14 15 14   Temp:  98.7 F (37.1 C)  98.2 F (36.8 C)  SpO2: 98% 100% 100% 98%  Weight:      Height:        Start Time: 1220 hrs. End Time: 1229 hrs. Materials:  Needle(s) Type: Epidural needle Gauge: 17G Length: 3.5-in Medication(s): Please see orders for medications and dosing details. 4 cc solution made of 2 cc of preservative-free saline, 1 cc of 0.2% ropivacaine, 1 cc of Decadron 10 mg/cc Imaging Guidance (Spinal):          Type of Imaging Technique: Fluoroscopy Guidance (Spinal) Indication(s): Assistance in needle guidance and placement for procedures requiring needle placement in or near specific anatomical locations not easily accessible without such assistance. Exposure Time: Please see nurses notes. Contrast: Before injecting any contrast, we confirmed that the patient did not have an allergy to iodine, shellfish, or radiological contrast. Once satisfactory needle placement was completed at the desired level, radiological contrast was injected. Contrast injected under live fluoroscopy. No contrast complications. See chart for type and volume of contrast used. Fluoroscopic Guidance: I was personally present  during the use of fluoroscopy. "Tunnel Vision Technique" used to obtain the best possible view of the target area. Parallax error corrected before commencing the procedure. "Direction-depth-direction" technique used to introduce the needle under continuous pulsed fluoroscopy. Once target was reached, antero-posterior, oblique, and lateral fluoroscopic projection used confirm needle placement in all planes. Images permanently stored in EMR. Interpretation: I personally interpreted the imaging intraoperatively. Adequate needle placement confirmed in multiple planes. Appropriate spread of contrast into desired area was observed. No evidence of afferent or efferent intravascular uptake. No intrathecal or subarachnoid spread observed. Permanent images saved into the patient's record.  Antibiotic Prophylaxis:   Anti-infectives (From admission, onward)   None     Indication(s): None identified  Post-operative Assessment:  Post-procedure Vital Signs:  Pulse/HCG Rate: 7170 Temp: 98.2 F (36.8 C) Resp: 14 BP: (!) 242/104 SpO2: 98 %  EBL: None  Complications: No immediate post-treatment complications observed by team, or reported by patient.  Note: The patient tolerated the entire procedure well. A repeat set of vitals were taken after the procedure and the patient was kept under observation following institutional policy, for this type of procedure. Post-procedural neurological assessment was performed, showing return to baseline, prior to discharge. The patient was provided with post-procedure discharge instructions, including a section on how to identify potential problems. Should any problems arise concerning this procedure, the patient was given instructions to immediately contact us, at any time, without hesitation. In any case, we plan to contact the patient by telephone for a follow-up status report regarding this interventional procedure.  Comments:  No additional relevant information. 5 out  of 5 strength bilateral upper extremity: Shoulder abduction, elbow flexion, elbow extension, thumb extension.  Plan of Care   Imaging Orders     DG C-Arm 1-60 Min-No Report Procedure Orders    No procedure(s) ordered today    Medications ordered for procedure: Meds ordered this encounter  Medications  . lactated ringers infusion 1,000 mL  . fentaNYL (SUBLIMAZE) injection 25-100 mcg    Make sure Narcan is available in the pyxis when using this medication. In the event of respiratory depression (RR< 8/min): Titrate NARCAN (naloxone) in increments of 0.1 to 0.2 mg IV at 2-3 minute intervals, until desired degree of reversal.  .  iopamidol (ISOVUE-M) 41 % intrathecal injection 10 mL  . dexamethasone (DECADRON) injection 10 mg  . ropivacaine (PF) 2 mg/mL (0.2%) (NAROPIN) injection 1 mL  . sodium chloride flush (NS) 0.9 % injection 1 mL  . lidocaine (XYLOCAINE) 2 % (with pres) injection 200 mg   Medications administered: We administered lactated ringers, fentaNYL, iopamidol, dexamethasone, ropivacaine (PF) 2 mg/mL (0.2%), sodium chloride flush, and lidocaine.  See the medical record for exact dosing, route, and time of administration.  Disposition: Discharge home  Discharge Date & Time: 12/07/2018; 1301 hrs.   Physician-requested Follow-up: Return in about 5 weeks (around 01/11/2019) for Post Procedure Evaluation.  Future Appointments  Date Time Provider [place]  01/11/2019  1:45 PM Gillis Santa, MD [facility] None   Primary Care Physician: Mikey College, NP Location: [facility] Note by: Gillis Santa, MD Date: 12/07/2018; Time: 3:19 PM  Disclaimer:  Medicine is not an exact science. The only guarantee in medicine is that nothing is guaranteed. It is important to note that the decision to proceed with this intervention was based on the information collected from the patient. The Data and conclusions were drawn from the patient's  questionnaire, the interview, and the physical examination. Because the information was provided in large part by the patient, it cannot be guaranteed that it has not been purposely or unconsciously manipulated. Every effort has been made to obtain as much relevant data as possible for this evaluation. It is important to note that the conclusions that lead to this procedure are derived in large part from the available data. Always take into account that the treatment will also be dependent on availability of resources and existing treatment guidelines, considered by other Pain Management Practitioners as being common knowledge and practice, at the time of the intervention. For Medico-Legal purposes, it is also important to point out that variation in procedural techniques and pharmacological choices are the acceptable norm. The indications, contraindications, technique, and results of the above procedure should only be interpreted and judged by a Board-Certified Interventional Pain Specialist with extensive familiarity and expertise in the same exact procedure and technique.

## 2018-12-08 ENCOUNTER — Telehealth: Payer: Self-pay

## 2018-12-08 DIAGNOSIS — N186 End stage renal disease: Secondary | ICD-10-CM | POA: Diagnosis not present

## 2018-12-08 DIAGNOSIS — Z992 Dependence on renal dialysis: Secondary | ICD-10-CM | POA: Diagnosis not present

## 2018-12-08 DIAGNOSIS — N2581 Secondary hyperparathyroidism of renal origin: Secondary | ICD-10-CM | POA: Diagnosis not present

## 2018-12-08 DIAGNOSIS — D631 Anemia in chronic kidney disease: Secondary | ICD-10-CM | POA: Diagnosis not present

## 2018-12-08 DIAGNOSIS — D509 Iron deficiency anemia, unspecified: Secondary | ICD-10-CM | POA: Diagnosis not present

## 2018-12-08 NOTE — Telephone Encounter (Signed)
No answer. Left message to call if needed,

## 2018-12-09 DIAGNOSIS — D631 Anemia in chronic kidney disease: Secondary | ICD-10-CM | POA: Diagnosis not present

## 2018-12-09 DIAGNOSIS — D509 Iron deficiency anemia, unspecified: Secondary | ICD-10-CM | POA: Diagnosis not present

## 2018-12-09 DIAGNOSIS — N186 End stage renal disease: Secondary | ICD-10-CM | POA: Diagnosis not present

## 2018-12-09 DIAGNOSIS — Z992 Dependence on renal dialysis: Secondary | ICD-10-CM | POA: Diagnosis not present

## 2018-12-09 DIAGNOSIS — N2581 Secondary hyperparathyroidism of renal origin: Secondary | ICD-10-CM | POA: Diagnosis not present

## 2018-12-12 DIAGNOSIS — N2581 Secondary hyperparathyroidism of renal origin: Secondary | ICD-10-CM | POA: Diagnosis not present

## 2018-12-12 DIAGNOSIS — D509 Iron deficiency anemia, unspecified: Secondary | ICD-10-CM | POA: Diagnosis not present

## 2018-12-12 DIAGNOSIS — N186 End stage renal disease: Secondary | ICD-10-CM | POA: Diagnosis not present

## 2018-12-12 DIAGNOSIS — D631 Anemia in chronic kidney disease: Secondary | ICD-10-CM | POA: Diagnosis not present

## 2018-12-12 DIAGNOSIS — Z992 Dependence on renal dialysis: Secondary | ICD-10-CM | POA: Diagnosis not present

## 2018-12-15 DIAGNOSIS — N186 End stage renal disease: Secondary | ICD-10-CM | POA: Diagnosis not present

## 2018-12-15 DIAGNOSIS — Z992 Dependence on renal dialysis: Secondary | ICD-10-CM | POA: Diagnosis not present

## 2018-12-15 DIAGNOSIS — N2581 Secondary hyperparathyroidism of renal origin: Secondary | ICD-10-CM | POA: Diagnosis not present

## 2018-12-15 DIAGNOSIS — D509 Iron deficiency anemia, unspecified: Secondary | ICD-10-CM | POA: Diagnosis not present

## 2018-12-15 DIAGNOSIS — D631 Anemia in chronic kidney disease: Secondary | ICD-10-CM | POA: Diagnosis not present

## 2018-12-16 DIAGNOSIS — Z992 Dependence on renal dialysis: Secondary | ICD-10-CM | POA: Diagnosis not present

## 2018-12-16 DIAGNOSIS — N2581 Secondary hyperparathyroidism of renal origin: Secondary | ICD-10-CM | POA: Diagnosis not present

## 2018-12-16 DIAGNOSIS — D631 Anemia in chronic kidney disease: Secondary | ICD-10-CM | POA: Diagnosis not present

## 2018-12-16 DIAGNOSIS — N186 End stage renal disease: Secondary | ICD-10-CM | POA: Diagnosis not present

## 2018-12-16 DIAGNOSIS — D509 Iron deficiency anemia, unspecified: Secondary | ICD-10-CM | POA: Diagnosis not present

## 2018-12-19 DIAGNOSIS — N186 End stage renal disease: Secondary | ICD-10-CM | POA: Diagnosis not present

## 2018-12-19 DIAGNOSIS — Z992 Dependence on renal dialysis: Secondary | ICD-10-CM | POA: Diagnosis not present

## 2018-12-19 DIAGNOSIS — D509 Iron deficiency anemia, unspecified: Secondary | ICD-10-CM | POA: Diagnosis not present

## 2018-12-19 DIAGNOSIS — D631 Anemia in chronic kidney disease: Secondary | ICD-10-CM | POA: Diagnosis not present

## 2018-12-19 DIAGNOSIS — N2581 Secondary hyperparathyroidism of renal origin: Secondary | ICD-10-CM | POA: Diagnosis not present

## 2018-12-20 DIAGNOSIS — Z992 Dependence on renal dialysis: Secondary | ICD-10-CM | POA: Diagnosis not present

## 2018-12-20 DIAGNOSIS — N186 End stage renal disease: Secondary | ICD-10-CM | POA: Diagnosis not present

## 2018-12-22 DIAGNOSIS — N186 End stage renal disease: Secondary | ICD-10-CM | POA: Diagnosis not present

## 2018-12-22 DIAGNOSIS — D509 Iron deficiency anemia, unspecified: Secondary | ICD-10-CM | POA: Diagnosis not present

## 2018-12-22 DIAGNOSIS — N2581 Secondary hyperparathyroidism of renal origin: Secondary | ICD-10-CM | POA: Diagnosis not present

## 2018-12-22 DIAGNOSIS — Z992 Dependence on renal dialysis: Secondary | ICD-10-CM | POA: Diagnosis not present

## 2018-12-22 DIAGNOSIS — D631 Anemia in chronic kidney disease: Secondary | ICD-10-CM | POA: Diagnosis not present

## 2018-12-23 DIAGNOSIS — Z992 Dependence on renal dialysis: Secondary | ICD-10-CM | POA: Diagnosis not present

## 2018-12-23 DIAGNOSIS — D509 Iron deficiency anemia, unspecified: Secondary | ICD-10-CM | POA: Diagnosis not present

## 2018-12-23 DIAGNOSIS — D631 Anemia in chronic kidney disease: Secondary | ICD-10-CM | POA: Diagnosis not present

## 2018-12-23 DIAGNOSIS — N186 End stage renal disease: Secondary | ICD-10-CM | POA: Diagnosis not present

## 2018-12-23 DIAGNOSIS — N2581 Secondary hyperparathyroidism of renal origin: Secondary | ICD-10-CM | POA: Diagnosis not present

## 2018-12-26 DIAGNOSIS — D631 Anemia in chronic kidney disease: Secondary | ICD-10-CM | POA: Diagnosis not present

## 2018-12-26 DIAGNOSIS — Z992 Dependence on renal dialysis: Secondary | ICD-10-CM | POA: Diagnosis not present

## 2018-12-26 DIAGNOSIS — N186 End stage renal disease: Secondary | ICD-10-CM | POA: Diagnosis not present

## 2018-12-26 DIAGNOSIS — N2581 Secondary hyperparathyroidism of renal origin: Secondary | ICD-10-CM | POA: Diagnosis not present

## 2018-12-26 DIAGNOSIS — E113511 Type 2 diabetes mellitus with proliferative diabetic retinopathy with macular edema, right eye: Secondary | ICD-10-CM | POA: Diagnosis not present

## 2018-12-26 DIAGNOSIS — E113512 Type 2 diabetes mellitus with proliferative diabetic retinopathy with macular edema, left eye: Secondary | ICD-10-CM | POA: Diagnosis not present

## 2018-12-26 DIAGNOSIS — D509 Iron deficiency anemia, unspecified: Secondary | ICD-10-CM | POA: Diagnosis not present

## 2018-12-27 ENCOUNTER — Encounter: Payer: Self-pay | Admitting: Student in an Organized Health Care Education/Training Program

## 2018-12-27 ENCOUNTER — Ambulatory Visit
Payer: Medicare Other | Attending: Student in an Organized Health Care Education/Training Program | Admitting: Student in an Organized Health Care Education/Training Program

## 2018-12-27 VITALS — BP 147/85 | HR 75 | Temp 98.1°F | Resp 16 | Ht 67.0 in | Wt 224.0 lb

## 2018-12-27 DIAGNOSIS — G894 Chronic pain syndrome: Secondary | ICD-10-CM

## 2018-12-27 DIAGNOSIS — M542 Cervicalgia: Secondary | ICD-10-CM

## 2018-12-27 DIAGNOSIS — E1142 Type 2 diabetes mellitus with diabetic polyneuropathy: Secondary | ICD-10-CM | POA: Diagnosis not present

## 2018-12-27 DIAGNOSIS — M5412 Radiculopathy, cervical region: Secondary | ICD-10-CM | POA: Insufficient documentation

## 2018-12-27 DIAGNOSIS — M47812 Spondylosis without myelopathy or radiculopathy, cervical region: Secondary | ICD-10-CM

## 2018-12-27 MED ORDER — OXYCODONE HCL 5 MG PO TABS: 5.0000 mg | ORAL_TABLET | Freq: Two times a day (BID) | ORAL | 0 refills | Status: DC | PRN

## 2018-12-27 NOTE — Progress Notes (Signed)
Safety precautions to be maintained throughout the outpatient stay will include: orient to surroundings, keep bed in low position, maintain call bell within reach at all times, provide assistance with transfer out of bed and ambulation.  

## 2018-12-27 NOTE — Progress Notes (Signed)
Patient's Name: [name]  MRN: [id number]  Referring Provider: Mikey College, *  DOB: 29-Mar-1966  PCP: Mikey College, NP  DOS: 12/27/2018  Note by: Gillis Santa, MD  Service setting: Ambulatory outpatient  Specialty: Interventional Pain Management  Location: [facility] (AMB) Pain Management Facility    Patient type: Established   Primary Reason(s) for Visit: Encounter for post-procedure evaluation of chronic illness with mild to moderate exacerbation CC: Neck Pain (right )  HPI  [name] is a 53 y.o. year old, female patient, who comes today for a post-procedure evaluation. She has Adjustment disorder with mixed disturbance of emotions and conduct; Dysthymia; History of MRSA infection; Acquired palmar and plantar hyperkeratosis; Essential hypertension; Tobacco abuse; ESRD (end stage renal disease) on dialysis ([place]); Diabetic macular edema ([place]); Hypertensive retinopathy of both eyes; Kidney transplant candidate; Non-proliferative diabetic retinopathy, severe, both eyes ([place]); Psoriatic arthropathy ([place]); Diabetes mellitus type 2, uncontrolled, with complications ([place]); SMA stenosis ([place]); Hyperkalemia; Dialysis AV fistula malfunction ([place]); Difficulty walking; Neuropathy due to type 2 diabetes mellitus ([place]); Neck pain; Anxiety; Chronic bilateral low back pain; Stool incontinence; Abdominal pain, chronic, right lower quadrant; Diarrhea; DDD (degenerative disc disease), cervical; Complication from renal dialysis device; Chronic hepatitis C virus genotype 1a infection ([place]); Seasonal allergic rhinitis due to pollen; Chronic hepatitis C without hepatic coma (HCC); and Rheumatoid arthritis involving multiple sites with positive rheumatoid factor (HCC) on their problem list. Her primarily concern today is the Neck Pain (right )  Pain Assessment: Location: Right Neck Radiating: down into shoulder into the arm and out the fingers,.  Onset: More than a month ago Duration: Chronic pain Quality:  Discomfort, Tingling, Numbness, Other (Comment), Heaviness(weakness of grip, buzzing) Severity: 8 /10 (subjective, self-reported pain score)  Note: Reported level is inconsistent with clinical observations. Clinically the patient looks like a 3/10 A 3/10 is viewed as "Moderate" and described as significantly interfering with activities of daily living (ADL). It becomes difficult to feed, bathe, get dressed, get on and off the toilet or to perform personal hygiene functions. Difficult to get in and out of bed or a chair without assistance. Very distracting. With effort, it can be ignored when deeply involved in activities. Information on the proper use of the pain scale provided to the patient today. When using our objective Pain Scale, levels between 6 and 10/10 are said to belong in an emergency room, as it progressively worsens from a 6/10, described as severely limiting, requiring emergency care not usually available at an outpatient pain management facility. At a 6/10 level, communication becomes difficult and requires great effort. Assistance to reach the emergency department may be required. Facial flushing and profuse sweating along with potentially dangerous increases in heart rate and blood pressure will be evident. Effect on ADL: unable to grip anything, is very guarded with arm today keeping it adducted into body Timing: Constant Modifying factors: nothing currently.  procedure helped for approximately 1 day.  BP: (!) 147/85  HR: 75  [name] comes in today for post-procedure evaluation.  Further details on both, my assessment(s), as well as the proposed treatment plan, please see below.  Post-Procedure Assessment  12/07/2018 Procedure: RIGHT C7-T1 ESI Pre-procedure pain score:  10/10 Post-procedure pain score: 4/10         Influential Factors: BMI: 35.08 kg/m Intra-procedural challenges: None observed.         Assessment challenges: None detected.              Reported  side-effects:  None.        Post-procedural adverse reactions or complications: None reported         Sedation: Please see nurses note. When no sedatives are used, the analgesic levels obtained are directly associated to the effectiveness of the local anesthetics. However, when sedation is provided, the level of analgesia obtained during the initial 1 hour following the intervention, is believed to be the result of a combination of factors. These factors may include, but are not limited to: 1. The effectiveness of the local anesthetics used. 2. The effects of the analgesic(s) and/or anxiolytic(s) used. 3. The degree of discomfort experienced by the patient at the time of the procedure. 4. The patients ability and reliability in recalling and recording the events. 5. The presence and influence of possible secondary gains and/or psychosocial factors. Reported result: Relief experienced during the 1st hour after the procedure: 100 % (Ultra-Short Term Relief)            Interpretative annotation: Clinically appropriate result. Analgesia during this period is likely to be Local Anesthetic and/or IV Sedative (Analgesic/Anxiolytic) related.          Effects of local anesthetic: The analgesic effects attained during this period are directly associated to the localized infiltration of local anesthetics and therefore cary significant diagnostic value as to the etiological location, or anatomical origin, of the pain. Expected duration of relief is directly dependent on the pharmacodynamics of the local anesthetic used. Long-acting (4-6 hours) anesthetics used.  Reported result: Relief during the next 4 to 6 hour after the procedure: 100 % (Short-Term Relief)            Interpretative annotation: Clinically appropriate result. Analgesia during this period is likely to be Local Anesthetic-related.          Long-term benefit: Defined as the period of time past the expected duration of local anesthetics (1 hour for  short-acting and 4-6 hours for long-acting). With the possible exception of prolonged sympathetic blockade from the local anesthetics, benefits during this period are typically attributed to, or associated with, other factors such as analgesic sensory neuropraxia, antiinflammatory effects, or beneficial biochemical changes provided by agents other than the local anesthetics.  Reported result: Extended relief following procedure: 0 % (Long-Term Relief)            Interpretative annotation: Clinically possible results. No benefit. No permanent benefit expected. Limited inflammation. Possible mechanical aggravating factors.          Current benefits: Defined as reported results that persistent at this point in time.   Analgesia: 0 %            Function: No benefit ROM: No benefit Interpretative annotation: Recurrence of symptoms. No long-term benefit expected. Results would argue against repeating therapy.          Interpretation: Results would suggest failure of therapy in achieving desired goal(s).                  Plan:  Please see "Plan of Care" for details.                Laboratory Chemistry  Inflammation Markers (CRP: Acute Phase) (ESR: Chronic Phase) Lab Results  Component Value Date   CRP <0.8 12/16/2016   ESRSEDRATE 117 (H) 10/12/2017   LATICACIDVEN 0.5 01/08/2017                         Rheumatology Markers Lab Results  Component Value  Date   ANA Negative 10/12/2017                        Renal Function Markers Lab Results  Component Value Date   BUN 75 (H) 12/27/2017   CREATININE 7.0 (A) 06/13/2018   BCR 10 12/27/2017   GFRAA 7 (L) 12/27/2017   GFRNONAA 6 (L) 12/27/2017                             Hepatic Function Markers Lab Results  Component Value Date   AST 23 04/07/2018   ALT 13 (L) 04/07/2018   ALBUMIN 3.8 04/07/2018   ALKPHOS 7.8 (A) 06/27/2018   HCVAB >11.0 (H) 12/16/2016   LIPASE 40 12/14/2017                        Electrolytes Lab Results   Component Value Date   NA 133 (L) 12/27/2017   K 5.2 (H) 07/15/2018   CL 96 (L) 12/27/2017   CALCIUM 8.7 12/27/2017   MG 2.4 12/14/2017   PHOS 7.6 (H) 05/21/2017   PHOS 8.1 (H) 05/21/2017                        Neuropathy Markers Lab Results  Component Value Date   HGBA1C 7.4 (A) 06/30/2018   HIV Non Reactive 12/15/2017                        CNS Tests No results found for: COLORCSF, APPEARCSF, RBCCOUNTCSF, WBCCSF, POLYSCSF, LYMPHSCSF, EOSCSF, PROTEINCSF, GLUCCSF, JCVIRUS, CSFOLI, IGGCSF                      Bone Pathology Markers No results found for: VD25OH, [id number], [id number], [id number], 25OHVITD1, 25OHVITD2, 25OHVITD3, TESTOFREE, TESTOSTERONE                       Coagulation Parameters Lab Results  Component Value Date   INR 1.05 04/07/2018   LABPROT 13.6 04/07/2018   APTT 56 (H) 05/19/2017   PLT 193 04/07/2018                        Cardiovascular Markers Lab Results  Component Value Date   BNP 2,289 (H) 04/13/2013   CKTOTAL 425 (H) 10/19/2014   CKMB 0.6 04/14/2013   TROPONINI <0.03 12/14/2017   HGB 11.2 (A) 06/27/2018   HCT 34 (A) 06/27/2018                         CA Markers No results found for: CEA, CA125, LABCA2                      Note: Lab results reviewed.  Recent Diagnostic Imaging Results  DG C-Arm 1-60 Min-No Report Fluoroscopy was utilized by the requesting physician.  No radiographic  interpretation.   Complexity Note: Imaging results reviewed. Results shared with [name], using Layman's terms.                         Meds   Current Outpatient Medications:  .  Adalimumab (HUMIRA PEN [place]), Inject into the skin every 14 (fourteen) days., Disp: , Rfl:  .  atorvastatin (LIPITOR) 20 MG tablet, Take 1 tablet (  20 mg total) by mouth daily., Disp: 90 tablet, Rfl: 3 .  calcium acetate (PHOSLO) 667 MG capsule, Take 1,334 mg by mouth 3 (three) times daily with meals. , Disp: , Rfl:  .  clobetasol (OLUX) 0.05 % topical foam, Apply topically  2 (two) times daily., Disp: , Rfl:  .  clobetasol ointment (TEMOVATE) 0.34 %, Apply 1 application topically daily as needed (for psorasis). , Disp: , Rfl:  .  diltiazem (CARDIZEM) 30 MG tablet, Take 1 tab (30 mg) by mouth once a day on Monday, Wed., and Friday. On non-dialysis days take 2 tab (60 mg) by mouth on daily., Disp: 132 tablet, Rfl: 2 .  glucose blood (TRUE METRIX BLOOD GLUCOSE TEST) test strip, Use as instructed, Disp: 100 each, Rfl: 12 .  insulin aspart (NOVOLOG) 100 UNIT/ML injection, Inject 1-2 Units into the skin 3 (three) times daily before meals., Disp: 10 mL, Rfl: 11 .  insulin glargine (LANTUS) 100 UNIT/ML injection, Inject 10 Units into the skin daily. , Disp: , Rfl:  .  ketoconazole (NIZORAL) 2 % cream, APPLY TOPICALLY ONTO THE SKIN TWICE A DAY AS NEEDED, Disp: , Rfl: 2 .  levocetirizine (XYZAL) 5 MG tablet, TAKE 1 TABLET(5 MG) BY MOUTH EVERY EVENING, Disp: 30 tablet, Rfl: 11 .  loperamide (IMODIUM A-D) 2 MG tablet, Take 2 mg by mouth 3 (three) times daily as needed. , Disp: , Rfl:  .  midodrine (PROAMATINE) 5 MG tablet, TK 1 T PO  DURING DIALYSIS TREATMENT FOR LOW BP, Disp: , Rfl: 5 .  ondansetron (ZOFRAN) 8 MG tablet, Take by mouth., Disp: , Rfl:  .  sevelamer carbonate (RENVELA) 800 MG tablet, Take 1,600 mg by mouth 3 (three) times daily with meals., Disp: , Rfl:  .  sodium polystyrene (KAYEXALATE) powder, MIX 30 GRAMS (8 LEVEL TEASPOONSFUL OF POWER) IN WATER & DRINK BY MOUTH AS A ONE TIME DOSE, Disp: , Rfl: 1 .  torsemide (DEMADEX) 100 MG tablet, Take 100 mg by mouth daily., Disp: , Rfl:  .  ULTICARE INSULIN SYRINGE 29G X 1/2" 0.5 ML MISC, USE 4 TIMES A DAY AS DIRECTED PER MD, Disp: 120 each, Rfl: 5 .  VOLTAREN 1 % GEL, APPLY 4 GRAMS TOPICALLY QID, Disp: , Rfl: 2 .  oxyCODONE (OXY IR/ROXICODONE) 5 MG immediate release tablet, Take 1 tablet (5 mg total) by mouth 2 (two) times daily as needed for severe pain., Disp: 60 tablet, Rfl: 0 .  [START ON 01/26/2019] oxyCODONE (OXY  IR/ROXICODONE) 5 MG immediate release tablet, Take 1 tablet (5 mg total) by mouth 2 (two) times daily as needed for severe pain., Disp: 60 tablet, Rfl: 0  ROS  Constitutional: Denies any fever or chills Gastrointestinal: No reported hemesis, hematochezia, vomiting, or acute GI distress Musculoskeletal: Denies any acute onset joint swelling, redness, loss of ROM, or weakness Neurological: No reported episodes of acute onset apraxia, aphasia, dysarthria, agnosia, amnesia, paralysis, loss of coordination, or loss of consciousness  Allergies  [name] is allergic to cinnamon; garlic; onion; tylenol [acetaminophen]; ciprofloxacin; and prednisone.  [place]  Drug: [name]  reports no history of drug use. Alcohol:  reports no history of alcohol use. Tobacco:  reports that she has been smoking cigarettes. She has a 5.00 pack-year smoking history. She has never used smokeless tobacco. Medical:  has a past medical history of Allergy, Anemia, Anxiety, Arthritis, Ataxia, COPD (chronic obstructive pulmonary disease) ([place]), Depression, Diabetes mellitus with complication ([place]), ESRD on hemodialysis ([place]), Fistula, Hepatitis (2003), Hiatal  hernia, Hypercholesterolemia, Hypertension, Migraine, Neuropathy, diabetic ([place]), Peripheral vascular disease ([place]), Pneumonia (2015), Psoriasis, Tobacco dependence, and Wears dentures. Surgical: [name]  has a past surgical history that includes Tonsillectomy; rt. tubal and ovary removed; Cholecystectomy; Cardiac catheterization (N/A, 07/25/2015); Cardiac catheterization (Left, 07/25/2015); Cardiac catheterization (Left, 10/07/2015); Cardiac catheterization (N/A, 10/07/2015); Cardiac catheterization (10/07/2015); Cardiac catheterization (N/A, 12/17/2015); Incision and drainage abscess (N/A, 07/16/2016); cyst removed  from left hand (Left, 1989); AV fistula placement (Left, 11/2014); Cardiac catheterization (N/A, 01/11/2017); Cardiac catheterization (N/A, 01/11/2017); DIALYSIS/PERMA  CATHETER INSERTION (N/A, 05/20/2017); Cesarean section; Tubal ligation; Colonoscopy (N/A, 09/22/2017); Esophagogastroduodenoscopy (N/A, 09/22/2017); polypectomy (09/22/2017); A/V SHUNT INTERVENTION (N/A, 12/16/2017); and STENT PLACEMENT VASCULAR ([facility] HX) (Left, 03/2018). Family: family history includes Heart disease in her mother; Hypertension in her father and mother; Skin cancer in her father.  Constitutional Exam  General appearance: alert, cooperative and in mild distress Vitals:   12/27/18 1139  BP: (!) 147/85  Pulse: 75  Resp: 16  Temp: 98.1 F (36.7 C)  TempSrc: Oral  SpO2: 100%  Weight: 224 lb (101.6 kg)  Height: '5\' 7"'$  (1.702 m)   BMI Assessment: Estimated body mass index is 35.08 kg/m as calculated from the following:   Height as of this encounter: '5\' 7"'$  (1.702 m).   Weight as of this encounter: 224 lb (101.6 kg).  BMI interpretation table: BMI level Category Range association with higher incidence of chronic pain  <18 kg/m2 Underweight   18.5-24.9 kg/m2 Ideal body weight   25-29.9 kg/m2 Overweight Increased incidence by 20%  30-34.9 kg/m2 Obese (Class I) Increased incidence by 68%  35-39.9 kg/m2 Severe obesity (Class II) Increased incidence by 136%  >40 kg/m2 Extreme obesity (Class III) Increased incidence by 254%   Patient's current BMI Ideal Body weight  Body mass index is 35.08 kg/m. Ideal body weight: 61.6 kg (135 lb 12.9 oz) Adjusted ideal body weight: 77.6 kg (171 lb 1.3 oz)   BMI Readings from Last 4 Encounters:  12/27/18 35.08 kg/m  12/07/18 35.08 kg/m  10/17/18 35.51 kg/m  08/02/18 34.74 kg/m   Wt Readings from Last 4 Encounters:  12/27/18 224 lb (101.6 kg)  12/07/18 224 lb (101.6 kg)  10/17/18 226 lb 12 oz (102.9 kg)  08/02/18 221 lb 12.8 oz (100.6 kg)  Psych/Mental status: Alert, oriented x 3 (person, place, & time)       Eyes: PERLA Respiratory: No evidence of acute respiratory distress  Cervical Spine Area Exam  Skin & Axial Inspection: No  masses, redness, edema, swelling, or associated skin lesions Alignment: Symmetrical Functional ROM: Decreased ROM, to the right Stability: No instability detected Muscle Tone/Strength: Functionally intact. No obvious neuro-muscular anomalies detected. Sensory (Neurological): Dermatomal pain pattern RIGHT C 5,6,7 Palpation: No palpable anomalies             + spurling's on right Upper Extremity (UE) Exam    Side: Right upper extremity  Side: Left upper extremity  Skin & Extremity Inspection: Skin color, temperature, and hair growth are WNL. No peripheral edema or cyanosis. No masses, redness, swelling, asymmetry, or associated skin lesions. No contractures.  Skin & Extremity Inspection: Skin color, temperature, and hair growth are WNL. No peripheral edema or cyanosis. No masses, redness, swelling, asymmetry, or associated skin lesions. No contractures.  Functional ROM: Decreased ROM for all joints of upper extremity  Functional ROM: Unrestricted ROM          Muscle Tone/Strength: Functionally intact. No obvious neuro-muscular anomalies detected.  Muscle Tone/Strength: Functionally intact. No obvious neuro-muscular  anomalies detected.  Sensory (Neurological): Dermatomal pain pattern affecting the shoulder and elbow  Sensory (Neurological): Unimpaired          Palpation: No palpable anomalies              Palpation: No palpable anomalies              Provocative Test(s):  Phalen's test: deferred Tinel's test: deferred Apley's scratch test (touch opposite shoulder):  Action 1 (Across chest): Decreased ROM Action 2 (Overhead): Decreased ROM Action 3 (LB reach): Decreased ROM   Provocative Test(s):  Phalen's test: deferred Tinel's test: deferred Apley's scratch test (touch opposite shoulder):  Action 1 (Across chest): deferred Action 2 (Overhead): deferred Action 3 (LB reach): deferred   4- out of 5 strength RIGHTupper extremity: Shoulder abduction, elbow flexion, elbow extension, thumb  extension. Thoracic Spine Area Exam  Skin & Axial Inspection: No masses, redness, or swelling Alignment: Symmetrical Functional ROM: Unrestricted ROM Stability: No instability detected Muscle Tone/Strength: Functionally intact. No obvious neuro-muscular anomalies detected. Sensory (Neurological): Unimpaired Muscle strength & Tone: No palpable anomalies  Lumbar Spine Area Exam  Skin & Axial Inspection: No masses, redness, or swelling Alignment: Symmetrical Functional ROM: Unrestricted ROM       Stability: No instability detected Muscle Tone/Strength: Functionally intact. No obvious neuro-muscular anomalies detected. Sensory (Neurological): Unimpaired Palpation: No palpable anomalies       Provocative Tests: Hyperextension/rotation test: deferred today       Lumbar quadrant test (Kemp's test): deferred today       Lateral bending test: deferred today       Patrick's Maneuver: deferred today                   FABER* test: deferred today                   S-I anterior distraction/compression test: deferred today         S-I lateral compression test: deferred today         S-I Thigh-thrust test: deferred today         S-I Gaenslen's test: deferred today         *(Flexion, ABduction and External Rotation)  Gait & Posture Assessment  Ambulation: Unassisted Gait: Relatively normal for age and body habitus Posture: WNL   Lower Extremity Exam    Side: Right lower extremity  Side: Left lower extremity  Stability: No instability observed          Stability: No instability observed          Skin & Extremity Inspection: Skin color, temperature, and hair growth are WNL. No peripheral edema or cyanosis. No masses, redness, swelling, asymmetry, or associated skin lesions. No contractures.  Skin & Extremity Inspection: Skin color, temperature, and hair growth are WNL. No peripheral edema or cyanosis. No masses, redness, swelling, asymmetry, or associated skin lesions. No contractures.   Functional ROM: Unrestricted ROM                  Functional ROM: Unrestricted ROM                  Muscle Tone/Strength: Functionally intact. No obvious neuro-muscular anomalies detected.  Muscle Tone/Strength: Functionally intact. No obvious neuro-muscular anomalies detected.  Sensory (Neurological): Unimpaired        Sensory (Neurological): Unimpaired        DTR: Patellar: deferred today Achilles: deferred today Plantar: deferred today  DTR: Patellar: deferred today Achilles: deferred today Plantar:  deferred today  Palpation: No palpable anomalies  Palpation: No palpable anomalies   Assessment  Primary Diagnosis & Pertinent Problem List: The primary encounter diagnosis was Cervical radiculopathy. Diagnoses of Chronic pain syndrome, Diabetic polyneuropathy associated with type 2 diabetes mellitus ([place]), Spondylosis of cervical region without myelopathy or radiculopathy, and Cervicalgia were also pertinent to this visit.  Status Diagnosis  Worsening Worsening Persistent 1. Cervical radiculopathy   2. Chronic pain syndrome   3. Diabetic polyneuropathy associated with type 2 diabetes mellitus (HCC)   4. Spondylosis of cervical region without myelopathy or radiculopathy   5. Cervicalgia     General Recommendations: The pain condition that the patient suffers from is best treated with a multidisciplinary approach that involves an increase in physical activity to prevent de-conditioning and worsening of the pain cycle, as well as psychological counseling (formal and/or informal) to address the co-morbid psychological affects of pain. Treatment will often involve judicious use of pain medications and interventional procedures to decrease the pain, allowing the patient to participate in the physical activity that will ultimately produce long-lasting pain reductions. The goal of the multidisciplinary approach is to return the patient to a higher level of overall function and to restore their  ability to perform activities of daily living.  53 year old female with a complex medical history including type 2 diabetes, end-stage renal disease on dialysis with persistent and worsening right neck and right arm pain secondary to cervical radiculopathy and foraminal stenosis.  Right arm pain could also be secondary to vascular insufficiency which I recommended she follow-up with her vascular surgeon.  Patient is status post right C7-T1 cervical epidural steroid injection which did not provide any significant pain relief in regards to her right radicular symptoms.  Patient did notice elevated blood sugars for 3 to 4 days after her procedure with reduction back to baseline thereafter.  Patient has cervical MRI that reveals diffuse cervical degenerative disc disease and foraminal stenosis at multiple levels on the right.  Patient has already seen one neurosurgeon and was deemed high risk for cervical decompression.  Given worsening symptoms, patient is interested in second opinion which I think is reasonable as well given that we have exhausted many options including physical therapy, multiple medication trials including membrane stabilizers as well as interventional options including cervical ESI.  Given the patient's worsening pain resulting in severe discomfort during dialysis, we discussed low-dose opioid therapy that she can utilize on her dialysis days to help her get through her dialysis sessions.  Patient will complete opioid contract and I will obtain serum toxicology screen.  Prescription for oxycodone 5 mg twice daily as needed as below.  [place] PMP checked and appropriate.  Plan: -Referral to orthopedic spine for second opinion regarding cervical decompression for persistent and severe right upper extremity radicular pain -Prescription for oxycodone as below  Plan of Care  Pharmacotherapy (Medications Ordered): Meds ordered this encounter  Medications  . oxyCODONE (OXY IR/ROXICODONE)  5 MG immediate release tablet    Sig: Take 1 tablet (5 mg total) by mouth 2 (two) times daily as needed for severe pain.    Dispense:  60 tablet    Refill:  0    Do not place this medication, or any other prescription from our practice, on "Automatic Refill". Patient may have prescription filled one day early if pharmacy is closed on scheduled refill date.  Marland Kitchen oxyCODONE (OXY IR/ROXICODONE) 5 MG immediate release tablet    Sig: Take 1 tablet (5 mg total) by  mouth 2 (two) times daily as needed for severe pain.    Dispense:  60 tablet    Refill:  0    Do not place this medication, or any other prescription from our practice, on "Automatic Refill". Patient may have prescription filled one day early if pharmacy is closed on scheduled refill date.   Lab-work, procedure(s), and/or referral(s): Orders Placed This Encounter  Procedures  . Drug Screen 10 W/Conf, Serum  . Ambulatory referral to Neurosurgery    Provider-requested follow-up: Return in about 8 weeks (around 02/21/2019) for Medication Management.  Future Appointments  Date Time Provider [place]  01/11/2019  1:45 PM Gillis Santa, MD [facility] None  01/12/2019  9:30 AM Rise Mu, PA-C [facility]  02/21/2019  8:45 AM Gillis Santa, MD [facility] None    Primary Care Physician: Mikey College, NP Location: [facility] Note by: Gillis Santa, M.D Date: 12/27/2018; Time: 2:06 PM  There are no Patient Instructions on file for this visit.

## 2018-12-28 DIAGNOSIS — N2581 Secondary hyperparathyroidism of renal origin: Secondary | ICD-10-CM | POA: Diagnosis not present

## 2018-12-28 DIAGNOSIS — Z992 Dependence on renal dialysis: Secondary | ICD-10-CM | POA: Diagnosis not present

## 2018-12-28 DIAGNOSIS — D631 Anemia in chronic kidney disease: Secondary | ICD-10-CM | POA: Diagnosis not present

## 2018-12-28 DIAGNOSIS — D509 Iron deficiency anemia, unspecified: Secondary | ICD-10-CM | POA: Diagnosis not present

## 2018-12-28 DIAGNOSIS — N186 End stage renal disease: Secondary | ICD-10-CM | POA: Diagnosis not present

## 2018-12-29 LAB — DRUG SCREEN 10 W/CONF, SERUM
Amphetamines, IA: NEGATIVE ng/mL
BENZODIAZEPINES, IA: NEGATIVE ng/mL
Barbiturates, IA: NEGATIVE ug/mL
Cocaine & Metabolite, IA: NEGATIVE ng/mL
Methadone, IA: NEGATIVE ng/mL
Opiates, IA: NEGATIVE ng/mL
Oxycodones, IA: NEGATIVE ng/mL
PHENCYCLIDINE, IA: NEGATIVE ng/mL
PROPOXYPHENE, IA: NEGATIVE ng/mL
THC(MARIJUANA) METABOLITE, IA: NEGATIVE ng/mL

## 2018-12-30 ENCOUNTER — Telehealth: Payer: Self-pay | Admitting: Nurse Practitioner

## 2018-12-30 ENCOUNTER — Other Ambulatory Visit: Payer: Self-pay

## 2018-12-30 DIAGNOSIS — Z992 Dependence on renal dialysis: Secondary | ICD-10-CM | POA: Diagnosis not present

## 2018-12-30 DIAGNOSIS — N2581 Secondary hyperparathyroidism of renal origin: Secondary | ICD-10-CM | POA: Diagnosis not present

## 2018-12-30 DIAGNOSIS — D631 Anemia in chronic kidney disease: Secondary | ICD-10-CM | POA: Diagnosis not present

## 2018-12-30 DIAGNOSIS — N186 End stage renal disease: Secondary | ICD-10-CM | POA: Diagnosis not present

## 2018-12-30 DIAGNOSIS — E118 Type 2 diabetes mellitus with unspecified complications: Principal | ICD-10-CM

## 2018-12-30 DIAGNOSIS — IMO0002 Reserved for concepts with insufficient information to code with codable children: Secondary | ICD-10-CM

## 2018-12-30 DIAGNOSIS — E1165 Type 2 diabetes mellitus with hyperglycemia: Secondary | ICD-10-CM

## 2018-12-30 DIAGNOSIS — D509 Iron deficiency anemia, unspecified: Secondary | ICD-10-CM | POA: Diagnosis not present

## 2018-12-30 MED ORDER — INSULIN ASPART 100 UNIT/ML ~~LOC~~ SOLN: 1.0000 [IU] | Freq: Three times a day (TID) | SUBCUTANEOUS | 0 refills | Status: DC

## 2018-12-30 MED ORDER — INSULIN GLARGINE 100 UNIT/ML ~~LOC~~ SOLN: 10.0000 [IU] | Freq: Every day | SUBCUTANEOUS | 0 refills | Status: DC

## 2018-12-30 NOTE — Telephone Encounter (Signed)
Drug  Store called requesting refill on [employer]

## 2019-01-02 DIAGNOSIS — D509 Iron deficiency anemia, unspecified: Secondary | ICD-10-CM | POA: Diagnosis not present

## 2019-01-02 DIAGNOSIS — N2581 Secondary hyperparathyroidism of renal origin: Secondary | ICD-10-CM | POA: Diagnosis not present

## 2019-01-02 DIAGNOSIS — Z992 Dependence on renal dialysis: Secondary | ICD-10-CM | POA: Diagnosis not present

## 2019-01-02 DIAGNOSIS — N186 End stage renal disease: Secondary | ICD-10-CM | POA: Diagnosis not present

## 2019-01-02 DIAGNOSIS — D631 Anemia in chronic kidney disease: Secondary | ICD-10-CM | POA: Diagnosis not present

## 2019-01-02 NOTE — Telephone Encounter (Signed)
error 

## 2019-01-04 DIAGNOSIS — D631 Anemia in chronic kidney disease: Secondary | ICD-10-CM | POA: Diagnosis not present

## 2019-01-04 DIAGNOSIS — Z992 Dependence on renal dialysis: Secondary | ICD-10-CM | POA: Diagnosis not present

## 2019-01-04 DIAGNOSIS — D509 Iron deficiency anemia, unspecified: Secondary | ICD-10-CM | POA: Diagnosis not present

## 2019-01-04 DIAGNOSIS — N186 End stage renal disease: Secondary | ICD-10-CM | POA: Diagnosis not present

## 2019-01-04 DIAGNOSIS — N2581 Secondary hyperparathyroidism of renal origin: Secondary | ICD-10-CM | POA: Diagnosis not present

## 2019-01-06 DIAGNOSIS — D631 Anemia in chronic kidney disease: Secondary | ICD-10-CM | POA: Diagnosis not present

## 2019-01-06 DIAGNOSIS — N2581 Secondary hyperparathyroidism of renal origin: Secondary | ICD-10-CM | POA: Diagnosis not present

## 2019-01-06 DIAGNOSIS — N186 End stage renal disease: Secondary | ICD-10-CM | POA: Diagnosis not present

## 2019-01-06 DIAGNOSIS — D509 Iron deficiency anemia, unspecified: Secondary | ICD-10-CM | POA: Diagnosis not present

## 2019-01-06 DIAGNOSIS — Z992 Dependence on renal dialysis: Secondary | ICD-10-CM | POA: Diagnosis not present

## 2019-01-09 DIAGNOSIS — N186 End stage renal disease: Secondary | ICD-10-CM | POA: Diagnosis not present

## 2019-01-09 DIAGNOSIS — Z992 Dependence on renal dialysis: Secondary | ICD-10-CM | POA: Diagnosis not present

## 2019-01-09 DIAGNOSIS — D631 Anemia in chronic kidney disease: Secondary | ICD-10-CM | POA: Diagnosis not present

## 2019-01-09 DIAGNOSIS — D509 Iron deficiency anemia, unspecified: Secondary | ICD-10-CM | POA: Diagnosis not present

## 2019-01-09 DIAGNOSIS — N2581 Secondary hyperparathyroidism of renal origin: Secondary | ICD-10-CM | POA: Diagnosis not present

## 2019-01-11 ENCOUNTER — Telehealth: Payer: Self-pay

## 2019-01-11 ENCOUNTER — Ambulatory Visit: Payer: Medicare Other | Admitting: Student in an Organized Health Care Education/Training Program

## 2019-01-11 ENCOUNTER — Other Ambulatory Visit: Payer: Self-pay

## 2019-01-11 DIAGNOSIS — D631 Anemia in chronic kidney disease: Secondary | ICD-10-CM | POA: Diagnosis not present

## 2019-01-11 DIAGNOSIS — N186 End stage renal disease: Secondary | ICD-10-CM | POA: Diagnosis not present

## 2019-01-11 DIAGNOSIS — D509 Iron deficiency anemia, unspecified: Secondary | ICD-10-CM | POA: Diagnosis not present

## 2019-01-11 DIAGNOSIS — N2581 Secondary hyperparathyroidism of renal origin: Secondary | ICD-10-CM | POA: Diagnosis not present

## 2019-01-11 DIAGNOSIS — E1165 Type 2 diabetes mellitus with hyperglycemia: Secondary | ICD-10-CM

## 2019-01-11 DIAGNOSIS — Z992 Dependence on renal dialysis: Secondary | ICD-10-CM | POA: Diagnosis not present

## 2019-01-11 DIAGNOSIS — IMO0002 Reserved for concepts with insufficient information to code with codable children: Secondary | ICD-10-CM

## 2019-01-11 DIAGNOSIS — E118 Type 2 diabetes mellitus with unspecified complications: Principal | ICD-10-CM

## 2019-01-11 MED ORDER — DILTIAZEM HCL 30 MG PO TABS: ORAL_TABLET | ORAL | 2 refills | Status: DC

## 2019-01-11 NOTE — Telephone Encounter (Signed)
Refill for Diltiazem 30 mg sent to [facility].

## 2019-01-12 ENCOUNTER — Ambulatory Visit: Payer: Medicare Other | Admitting: Physician Assistant

## 2019-01-13 DIAGNOSIS — D509 Iron deficiency anemia, unspecified: Secondary | ICD-10-CM | POA: Diagnosis not present

## 2019-01-13 DIAGNOSIS — L851 Acquired keratosis [keratoderma] palmaris et plantaris: Secondary | ICD-10-CM | POA: Diagnosis not present

## 2019-01-13 DIAGNOSIS — Z794 Long term (current) use of insulin: Secondary | ICD-10-CM | POA: Diagnosis not present

## 2019-01-13 DIAGNOSIS — E1140 Type 2 diabetes mellitus with diabetic neuropathy, unspecified: Secondary | ICD-10-CM | POA: Diagnosis not present

## 2019-01-13 DIAGNOSIS — Z992 Dependence on renal dialysis: Secondary | ICD-10-CM | POA: Diagnosis not present

## 2019-01-13 DIAGNOSIS — B351 Tinea unguium: Secondary | ICD-10-CM | POA: Diagnosis not present

## 2019-01-13 DIAGNOSIS — D631 Anemia in chronic kidney disease: Secondary | ICD-10-CM | POA: Diagnosis not present

## 2019-01-13 DIAGNOSIS — N186 End stage renal disease: Secondary | ICD-10-CM | POA: Diagnosis not present

## 2019-01-13 DIAGNOSIS — N2581 Secondary hyperparathyroidism of renal origin: Secondary | ICD-10-CM | POA: Diagnosis not present

## 2019-01-16 DIAGNOSIS — Z794 Long term (current) use of insulin: Secondary | ICD-10-CM | POA: Diagnosis not present

## 2019-01-16 DIAGNOSIS — N2581 Secondary hyperparathyroidism of renal origin: Secondary | ICD-10-CM | POA: Diagnosis not present

## 2019-01-16 DIAGNOSIS — D509 Iron deficiency anemia, unspecified: Secondary | ICD-10-CM | POA: Diagnosis not present

## 2019-01-16 DIAGNOSIS — Z992 Dependence on renal dialysis: Secondary | ICD-10-CM | POA: Diagnosis not present

## 2019-01-16 DIAGNOSIS — N186 End stage renal disease: Secondary | ICD-10-CM | POA: Diagnosis not present

## 2019-01-16 DIAGNOSIS — E119 Type 2 diabetes mellitus without complications: Secondary | ICD-10-CM | POA: Diagnosis not present

## 2019-01-16 DIAGNOSIS — D631 Anemia in chronic kidney disease: Secondary | ICD-10-CM | POA: Diagnosis not present

## 2019-01-18 ENCOUNTER — Telehealth: Payer: Self-pay | Admitting: Student in an Organized Health Care Education/Training Program

## 2019-01-18 DIAGNOSIS — Z992 Dependence on renal dialysis: Secondary | ICD-10-CM | POA: Diagnosis not present

## 2019-01-18 DIAGNOSIS — N186 End stage renal disease: Secondary | ICD-10-CM | POA: Diagnosis not present

## 2019-01-18 DIAGNOSIS — D631 Anemia in chronic kidney disease: Secondary | ICD-10-CM | POA: Diagnosis not present

## 2019-01-18 DIAGNOSIS — N2581 Secondary hyperparathyroidism of renal origin: Secondary | ICD-10-CM | POA: Diagnosis not present

## 2019-01-18 DIAGNOSIS — D509 Iron deficiency anemia, unspecified: Secondary | ICD-10-CM | POA: Diagnosis not present

## 2019-01-18 NOTE — Telephone Encounter (Signed)
Pt called and left a voicemail stating that she hasn't heard anything from neurosurgery and wanted to know if Dr. Holley Raring sent a referral over? Please call pt back.

## 2019-01-20 DIAGNOSIS — D509 Iron deficiency anemia, unspecified: Secondary | ICD-10-CM | POA: Diagnosis not present

## 2019-01-20 DIAGNOSIS — N2581 Secondary hyperparathyroidism of renal origin: Secondary | ICD-10-CM | POA: Diagnosis not present

## 2019-01-20 DIAGNOSIS — D631 Anemia in chronic kidney disease: Secondary | ICD-10-CM | POA: Diagnosis not present

## 2019-01-20 DIAGNOSIS — N186 End stage renal disease: Secondary | ICD-10-CM | POA: Diagnosis not present

## 2019-01-20 DIAGNOSIS — Z992 Dependence on renal dialysis: Secondary | ICD-10-CM | POA: Diagnosis not present

## 2019-01-23 DIAGNOSIS — Z992 Dependence on renal dialysis: Secondary | ICD-10-CM | POA: Diagnosis not present

## 2019-01-23 DIAGNOSIS — D509 Iron deficiency anemia, unspecified: Secondary | ICD-10-CM | POA: Diagnosis not present

## 2019-01-23 DIAGNOSIS — N2581 Secondary hyperparathyroidism of renal origin: Secondary | ICD-10-CM | POA: Diagnosis not present

## 2019-01-23 DIAGNOSIS — N186 End stage renal disease: Secondary | ICD-10-CM | POA: Diagnosis not present

## 2019-01-24 ENCOUNTER — Telehealth: Payer: Self-pay | Admitting: Student in an Organized Health Care Education/Training Program

## 2019-01-24 DIAGNOSIS — M5412 Radiculopathy, cervical region: Secondary | ICD-10-CM

## 2019-01-24 NOTE — Telephone Encounter (Signed)
Dr. Holley Raring, please advise.

## 2019-01-24 NOTE — Telephone Encounter (Signed)
Pt called and left a voicemail stating that Cecille Rubin called her and told her that there was no referral for her to get a second opinion to neurosurgery. Pt states that Dr. Holley Raring told her he was going to refer her. Please call pt to verify.

## 2019-01-26 ENCOUNTER — Ambulatory Visit (INDEPENDENT_AMBULATORY_CARE_PROVIDER_SITE_OTHER): Payer: Medicare Other | Admitting: Nurse Practitioner

## 2019-01-26 ENCOUNTER — Encounter: Payer: Self-pay | Admitting: Nurse Practitioner

## 2019-01-26 VITALS — BP 158/70 | HR 73 | Ht 67.0 in | Wt 220.5 lb

## 2019-01-26 DIAGNOSIS — N186 End stage renal disease: Secondary | ICD-10-CM

## 2019-01-26 DIAGNOSIS — I10 Essential (primary) hypertension: Secondary | ICD-10-CM

## 2019-01-26 DIAGNOSIS — I951 Orthostatic hypotension: Secondary | ICD-10-CM

## 2019-01-26 DIAGNOSIS — M7540 Impingement syndrome of unspecified shoulder: Secondary | ICD-10-CM

## 2019-01-26 DIAGNOSIS — M47812 Spondylosis without myelopathy or radiculopathy, cervical region: Secondary | ICD-10-CM | POA: Diagnosis not present

## 2019-01-26 MED ORDER — DILTIAZEM HCL 60 MG PO TABS: 60.0000 mg | ORAL_TABLET | Freq: Three times a day (TID) | ORAL | 11 refills | Status: DC

## 2019-01-26 MED ORDER — DILTIAZEM HCL ER 60 MG PO CP12: 60.0000 mg | ORAL_CAPSULE | Freq: Three times a day (TID) | ORAL | 11 refills | Status: DC

## 2019-01-26 NOTE — Patient Instructions (Addendum)
Medication Instructions:  Your physician has recommended you make the following change in your medication:  1- STOP Clonidine 2- Change Diltiazem Take 1 tablet (60 mg total) by mouth 3 (three) times daily  If you need a refill on your cardiac medications before your next appointment, please call your pharmacy.   Lab work: None ordered  If you have labs (blood work) drawn today and your tests are completely normal, you will receive your results only by: Marland Kitchen MyChart Message (if you have MyChart) OR . A paper copy in the mail If you have any lab test that is abnormal or we need to change your treatment, we will call you to review the results.  Testing/Procedures: None ordered   Follow-Up: At [facility], you and your health needs are our priority.  As part of our continuing mission to provide you with exceptional heart care, we have created designated Provider Care Teams.  These Care Teams include your primary Cardiologist (physician) and Advanced Practice Providers (APPs -  Physician Assistants and Nurse Practitioners) who all work together to provide you with the care you need, when you need it. You will need a follow up appointment in 6 months.  Please call our office 2 months in advance to schedule this appointment.  You may see Dr. Fletcher Anon or Ignacia Bayley, NP.  Any Other Special Instructions Will Be Listed Below (If Applicable). 1- Referral to [facility]  [address] [place], [place], [place] [address] [phone]

## 2019-01-26 NOTE — Progress Notes (Signed)
Office Visit    Patient Name: [name] Date of Encounter: 01/26/2019  Primary Care Provider:  Mikey College, NP Primary Cardiologist:  Kathlyn Sacramento, MD  Chief Complaint    53 year old female with a history of end-stage renal disease on hemodialysis x3 years, COPD, ongoing tobacco abuse, type 2 diabetes mellitus since 1989, hypertension, migraines, hepatitis C, hyperlipidemia, peripheral vascular disease, anxiety, and depression, who presents for follow-up due to orthostasis.  Past Medical History    Past Medical History:  Diagnosis Date  . Allergy   . Anemia   . Anxiety    worse when not at home - bowel incontinence  . Arthritis   . Ataxia   . COPD (chronic obstructive pulmonary disease) ([place])   . Depression   . Diabetes mellitus with complication ([place])   . ESRD on hemodialysis ([place])    MWF dialysis  . Fistula    left upper arm  . Hepatitis 2003   Hep C  . Hiatal hernia   . Hypercholesterolemia   . Hypertension   . Migraine   . Neuropathy, diabetic ([facility])    lower legs  . Peripheral vascular disease ([place])   . Pneumonia 2015  . Psoriasis   . Tobacco dependence   . Wears dentures    full upper, partial lower   Past Surgical History:  Procedure Laterality Date  . A/V SHUNT INTERVENTION N/A 12/16/2017   Procedure: A/V SHUNT INTERVENTION;  Surgeon: Algernon Huxley, MD;  Location: [place] [facility];  Service: Cardiovascular;  Laterality: N/A;  . AV FISTULA PLACEMENT Left 11/2014  . CESAREAN SECTION    . CHOLECYSTECTOMY    . COLONOSCOPY N/A 09/22/2017   Procedure: COLONOSCOPY;  Surgeon: Lin Landsman, MD;  Location: [place];  Service: Endoscopy;  Laterality: N/A;  . cyst removed  from left hand Left 1989  . DIALYSIS/PERMA CATHETER INSERTION N/A 05/20/2017   Procedure: Dialysis/Perma Catheter Insertion and fistulagram/LUE angiogram;  Surgeon: Algernon Huxley, MD;  Location: [place] [facility];  Service: Cardiovascular;  Laterality: N/A;    . ESOPHAGOGASTRODUODENOSCOPY N/A 09/22/2017   Procedure: ESOPHAGOGASTRODUODENOSCOPY (EGD);  Surgeon: Lin Landsman, MD;  Location: [place];  Service: Endoscopy;  Laterality: N/A;  . INCISION AND DRAINAGE ABSCESS N/A 07/16/2016   Procedure: INCISION AND DRAINAGE ABSCESS;  Surgeon: Carloyn Manner, MD;  Location: [facility];  Service: ENT;  Laterality: N/A;  . PERIPHERAL VASCULAR CATHETERIZATION N/A 07/25/2015   Procedure: A/V Shuntogram/Fistulagram;  Surgeon: Algernon Huxley, MD;  Location: [place] [facility];  Service: Cardiovascular;  Laterality: N/A;  . PERIPHERAL VASCULAR CATHETERIZATION Left 07/25/2015   Procedure: A/V Shunt Intervention;  Surgeon: Algernon Huxley, MD;  Location: [place] [facility];  Service: Cardiovascular;  Laterality: Left;  . PERIPHERAL VASCULAR CATHETERIZATION Left 10/07/2015   Procedure: A/V Shuntogram/Fistulagram;  Surgeon: Algernon Huxley, MD;  Location: [place] [facility];  Service: Cardiovascular;  Laterality: Left;  . PERIPHERAL VASCULAR CATHETERIZATION N/A 10/07/2015   Procedure: A/V Shunt Intervention;  Surgeon: Algernon Huxley, MD;  Location: [place] [facility];  Service: Cardiovascular;  Laterality: N/A;  . PERIPHERAL VASCULAR CATHETERIZATION  10/07/2015   Procedure: Dialysis/Perma Catheter Insertion;  Surgeon: Algernon Huxley, MD;  Location: [place] [facility];  Service: Cardiovascular;;  . PERIPHERAL VASCULAR CATHETERIZATION N/A 12/17/2015   Procedure: Dialysis/Perma Catheter Removal;  Surgeon: Katha Cabal, MD;  Location: [place] [facility];  Service: Cardiovascular;  Laterality: N/A;  . PERIPHERAL VASCULAR  CATHETERIZATION N/A 01/11/2017   Procedure: Visceral Angiography;  Surgeon: Algernon Huxley, MD;  Location: [place] [facility];  Service: Cardiovascular;  Laterality: N/A;  . PERIPHERAL VASCULAR CATHETERIZATION N/A 01/11/2017   Procedure: Visceral Artery Intervention;  Surgeon: Algernon Huxley, MD;  Location: [place] [facility];  Service:  Cardiovascular;  Laterality: N/A;  . POLYPECTOMY  09/22/2017   Procedure: POLYPECTOMY;  Surgeon: Lin Landsman, MD;  Location: [place];  Service: Endoscopy;;  . rt. tubal and ovary removed    . STENT PLACEMENT VASCULAR ([place] HX) Left 03/2018   Performed at [facility] using EV3 protg GPS stent graph [id number] lot [id number]  . TONSILLECTOMY    . TUBAL LIGATION      Allergies  Allergies  Allergen Reactions  . Cinnamon Anaphylaxis  . Garlic Anaphylaxis and Hives  . Onion Hives and Swelling  . Tylenol [Acetaminophen] Anaphylaxis  . Ciprofloxacin Diarrhea  . Prednisone Other (See Comments)    Vaginal blisters & oral blisters Vaginal blisters & oral blisters Vaginal blisters & oral blisters    History of Present Illness    53 year old female with the above complex past medical history.  She was previous evaluated by Dr. Ubaldo Glassing in 2017 for atypical chest pain and underwent stress echocardiogram which showed normal LV function.  She was later referred to Dr. Fletcher Anon in June 2019 for dizziness and presyncope, along with palpitations.  A 0 monitor was placed in the compliance with wearing was poor, she was only noted to have rare PACs and PVCs with no significant arrhythmias.  A follow-up echocardiogram in June 2019 showed normal LV function with grade 1 diastolic dysfunction.  In July 2019, she was switched from long-acting to short acting diltiazem upon follow-up in October 2019, she was doing a bit better with less frequent episodes of dizziness and drops in blood pressure but also noted elevated blood pressures on the mornings of dialysis when not taking the diltiazem.  As result, it was recommended that she just take 30 mg of diltiazem on the morning of dialysis and otherwise will continue take 60 mg 3 times daily.  She says that she was given 30 mg tablets and did not have enough and so she ended up trying to ration these which has resulted in higher blood  pressures in the evenings and therefore, she would take as needed clonidine.  The following mornings, she would often note lower blood pressures.  She thinks if she can just get back on the 60 mg 3 times daily of diltiazem (with 30s on dialysis mornings), that things would settle out.  She has not been having any chest pain or dyspnea and further denies PND, orthopnea, syncope, edema, or early satiety.  She has continued to have right arm pain and paresthesias for which she has been seen in pain clinic.  She and her spouse are wishing to be referred to neurosurgery for another opinion as she says is been going on for over a year and is becoming unbearable.  Home Medications    Prior to Admission medications   Medication Sig Start Date End Date Taking? Authorizing Provider  Adalimumab (HUMIRA PEN [place]) Inject into the skin every 14 (fourteen) days.   Yes [provider]  atorvastatin (LIPITOR) 20 MG tablet Take 1 tablet (20 mg total) by mouth daily. 06/30/18  Yes Mikey College, NP  calcium acetate (PHOSLO) 667 MG capsule Take 1,334 mg by mouth 3 (three) times daily with meals.  Yes [provider]  clobetasol (OLUX) 0.05 % topical foam Apply topically 2 (two) times daily.   Yes [provider]  clobetasol ointment (TEMOVATE) 2.72 % Apply 1 application topically daily as needed (for psorasis).    Yes [provider]  cloNIDine (CATAPRES) 0.2 MG tablet Take 0.2 mg by mouth at bedtime.   Yes [provider]  diltiazem (CARDIZEM) 30 MG tablet Take 1 tab (30 mg) by mouth once a day on Monday, Wed., and Friday. On non-dialysis days take 2 tab (60 mg) by mouth on daily. 01/11/19  Yes Dunn, Areta Haber, PA-C  glucose blood (TRUE METRIX BLOOD GLUCOSE TEST) test strip Use as instructed 01/11/18  Yes Mikey College, NP  insulin aspart (NOVOLOG) 100 UNIT/ML injection Inject 1-2 Units into the skin 3 (three) times daily before meals. 12/30/18  Yes Mikey College, NP  insulin glargine (LANTUS) 100 UNIT/ML injection Inject 0.1 mLs (10 Units total) into the skin daily. 12/30/18  Yes Mikey College, NP  ketoconazole (NIZORAL) 2 % cream APPLY TOPICALLY ONTO THE SKIN TWICE A DAY AS NEEDED 07/19/18  Yes [provider]  levocetirizine (XYZAL) 5 MG tablet TAKE 1 TABLET(5 MG) BY MOUTH EVERY EVENING 04/18/18  Yes Mikey College, NP  loperamide (IMODIUM A-D) 2 MG tablet Take 2 mg by mouth 3 (three) times daily as needed.    Yes [provider]  midodrine (PROAMATINE) 5 MG tablet TK 1 T PO  DURING DIALYSIS TREATMENT FOR LOW BP 03/22/18  Yes [provider]  ondansetron (ZOFRAN) 8 MG tablet Take by mouth.   Yes [provider]  oxyCODONE (OXY IR/ROXICODONE) 5 MG immediate release tablet Take 1 tablet (5 mg total) by mouth 2 (two) times daily as needed for severe pain. 12/27/18 01/26/19 Yes Gillis Santa, MD  sodium polystyrene (KAYEXALATE) powder MIX 30 GRAMS (8 LEVEL TEASPOONSFUL OF POWER) IN WATER & DRINK BY MOUTH AS A ONE TIME DOSE 07/18/18  Yes [provider]  torsemide (DEMADEX) 100 MG tablet Take 100 mg by mouth daily.   Yes [provider]  Flossie Buffy INSULIN SYRINGE 29G X 1/2" 0.5 ML MISC USE 4 TIMES A DAY AS DIRECTED PER MD 08/15/18  Yes Mikey College, NP  oxyCODONE (OXY IR/ROXICODONE) 5 MG immediate release tablet Take 1 tablet (5 mg total) by mouth 2 (two) times daily as needed for severe pain. Patient not taking: Reported on 01/26/2019 01/26/19 02/25/19  Gillis Santa, MD    Review of Systems    Ongoing intermittent lightheadedness with drops in blood pressure, especially on dialysis days.  She denies chest pain, dyspnea, palpitations, PND, orthopnea, syncope, edema, or early satiety all other systems reviewed and are otherwise negative except as noted above.  Physical Exam    VS:  BP (!) 158/70 (BP Location: Left Arm, Patient Position: Sitting, Cuff Size: Normal)   Pulse 73   Ht 5\' 7"   (1.702 m)   Wt 220 lb 8 oz (100 kg)   BMI 34.54 kg/m  , BMI Body mass index is 34.54 kg/m. GEN: Well nourished, well developed, in no acute distress. HEENT: normal. Neck: Supple, no JVD, carotid bruits, or masses. Cardiac: RRR, no murmurs, rubs, or gallops. No clubbing, cyanosis, edema.  Radials/PT 2+ and equal bilaterally.  Respiratory:  Respirations regular and unlabored, clear to auscultation bilaterally. GI: Soft, nontender, nondistended, BS + x 4. MS: no deformity or atrophy. Skin: warm and dry, no rash. Neuro:  Strength and sensation are  intact. Psych: Normal affect.  Accessory Clinical Findings    ECG personally reviewed by me today -regular sinus rhythm, 73, borderline left atrial enlargement, LVH- no acute changes.  Assessment & Plan    1.  Orthostatic hypotension/presyncope: Patient says due to a mixup with her diltiazem dosing, she does not have enough tablets and as result she has been rationing these.  She was only given 30 mg tablets instead of 60 mg tablets and therefore has been using as needed clonidine in the evenings.  She feels as though she has taken a step backwards because she was doing better when she was on 60 mg of diltiazem 3 times daily.  I reviewed her previous prescription and note that she was prescribed an inadequate number of tablets.  We have refilled 60 mg 3 times daily with the understanding that she will be using 30 mg on the mornings of dialysis-Monday, Wednesday, Friday with 60 mg afternoon and evening doses on those days if pressure allows.  I recommended that she try and avoid using clonidine as I suspect this contributes to large dips in blood pressure followed by rebound hypertension.  2.  Essential hypertension: See #1.  3.  End-stage renal disease: On Monday Wednesday Friday dialysis.  She does have as needed Midrin for drops in blood pressure during dialysis.  4.  Ongoing tobacco abuse: Still smoking 1 to 2 cigarettes a day.  Complete  cessation is advised.  She is not currently contemplating quitting.  5.  Hyperlipidemia: She is on statin therapy and this is been followed by her primary care provider.  6.  Cervical spondylosis with impingement: Patient has significant right arm pain and paresthesias.  She has been seen by pain clinic with an epidural last year, which did not provide significant relief.  She has requested a neurosurgical evaluation and a referral has been made.  7.  Disposition: Follow-up in 3 months or sooner if necessary.   Murray Hodgkins, NP 01/26/2019, 6:04 PM

## 2019-01-27 DIAGNOSIS — Z992 Dependence on renal dialysis: Secondary | ICD-10-CM | POA: Diagnosis not present

## 2019-01-27 DIAGNOSIS — N2581 Secondary hyperparathyroidism of renal origin: Secondary | ICD-10-CM | POA: Diagnosis not present

## 2019-01-27 DIAGNOSIS — D509 Iron deficiency anemia, unspecified: Secondary | ICD-10-CM | POA: Diagnosis not present

## 2019-01-27 DIAGNOSIS — N186 End stage renal disease: Secondary | ICD-10-CM | POA: Diagnosis not present

## 2019-01-30 ENCOUNTER — Other Ambulatory Visit: Payer: Self-pay

## 2019-01-30 DIAGNOSIS — D509 Iron deficiency anemia, unspecified: Secondary | ICD-10-CM | POA: Diagnosis not present

## 2019-01-30 DIAGNOSIS — Z992 Dependence on renal dialysis: Secondary | ICD-10-CM | POA: Diagnosis not present

## 2019-01-30 DIAGNOSIS — N2581 Secondary hyperparathyroidism of renal origin: Secondary | ICD-10-CM | POA: Diagnosis not present

## 2019-01-30 DIAGNOSIS — N186 End stage renal disease: Secondary | ICD-10-CM | POA: Diagnosis not present

## 2019-02-01 DIAGNOSIS — Z992 Dependence on renal dialysis: Secondary | ICD-10-CM | POA: Diagnosis not present

## 2019-02-01 DIAGNOSIS — N186 End stage renal disease: Secondary | ICD-10-CM | POA: Diagnosis not present

## 2019-02-01 DIAGNOSIS — D509 Iron deficiency anemia, unspecified: Secondary | ICD-10-CM | POA: Diagnosis not present

## 2019-02-01 DIAGNOSIS — N2581 Secondary hyperparathyroidism of renal origin: Secondary | ICD-10-CM | POA: Diagnosis not present

## 2019-02-03 DIAGNOSIS — N2581 Secondary hyperparathyroidism of renal origin: Secondary | ICD-10-CM | POA: Diagnosis not present

## 2019-02-03 DIAGNOSIS — D509 Iron deficiency anemia, unspecified: Secondary | ICD-10-CM | POA: Diagnosis not present

## 2019-02-03 DIAGNOSIS — N186 End stage renal disease: Secondary | ICD-10-CM | POA: Diagnosis not present

## 2019-02-03 DIAGNOSIS — Z992 Dependence on renal dialysis: Secondary | ICD-10-CM | POA: Diagnosis not present

## 2019-02-06 DIAGNOSIS — N2581 Secondary hyperparathyroidism of renal origin: Secondary | ICD-10-CM | POA: Diagnosis not present

## 2019-02-06 DIAGNOSIS — N186 End stage renal disease: Secondary | ICD-10-CM | POA: Diagnosis not present

## 2019-02-06 DIAGNOSIS — Z992 Dependence on renal dialysis: Secondary | ICD-10-CM | POA: Diagnosis not present

## 2019-02-06 DIAGNOSIS — D509 Iron deficiency anemia, unspecified: Secondary | ICD-10-CM | POA: Diagnosis not present

## 2019-02-08 DIAGNOSIS — Z992 Dependence on renal dialysis: Secondary | ICD-10-CM | POA: Diagnosis not present

## 2019-02-08 DIAGNOSIS — N186 End stage renal disease: Secondary | ICD-10-CM | POA: Diagnosis not present

## 2019-02-08 DIAGNOSIS — D509 Iron deficiency anemia, unspecified: Secondary | ICD-10-CM | POA: Diagnosis not present

## 2019-02-08 DIAGNOSIS — N2581 Secondary hyperparathyroidism of renal origin: Secondary | ICD-10-CM | POA: Diagnosis not present

## 2019-02-10 DIAGNOSIS — N2581 Secondary hyperparathyroidism of renal origin: Secondary | ICD-10-CM | POA: Diagnosis not present

## 2019-02-10 DIAGNOSIS — Z992 Dependence on renal dialysis: Secondary | ICD-10-CM | POA: Diagnosis not present

## 2019-02-10 DIAGNOSIS — D509 Iron deficiency anemia, unspecified: Secondary | ICD-10-CM | POA: Diagnosis not present

## 2019-02-10 DIAGNOSIS — N186 End stage renal disease: Secondary | ICD-10-CM | POA: Diagnosis not present

## 2019-02-13 DIAGNOSIS — N2581 Secondary hyperparathyroidism of renal origin: Secondary | ICD-10-CM | POA: Diagnosis not present

## 2019-02-13 DIAGNOSIS — N186 End stage renal disease: Secondary | ICD-10-CM | POA: Diagnosis not present

## 2019-02-13 DIAGNOSIS — Z992 Dependence on renal dialysis: Secondary | ICD-10-CM | POA: Diagnosis not present

## 2019-02-13 DIAGNOSIS — D509 Iron deficiency anemia, unspecified: Secondary | ICD-10-CM | POA: Diagnosis not present

## 2019-02-14 ENCOUNTER — Ambulatory Visit
Payer: Medicare Other | Attending: Student in an Organized Health Care Education/Training Program | Admitting: Student in an Organized Health Care Education/Training Program

## 2019-02-14 ENCOUNTER — Encounter: Payer: Self-pay | Admitting: Student in an Organized Health Care Education/Training Program

## 2019-02-14 ENCOUNTER — Other Ambulatory Visit: Payer: Self-pay

## 2019-02-14 VITALS — BP 154/72 | HR 87 | Temp 99.0°F | Resp 18 | Ht 67.0 in | Wt 217.2 lb

## 2019-02-14 DIAGNOSIS — G894 Chronic pain syndrome: Secondary | ICD-10-CM | POA: Diagnosis not present

## 2019-02-14 DIAGNOSIS — M542 Cervicalgia: Secondary | ICD-10-CM | POA: Diagnosis not present

## 2019-02-14 DIAGNOSIS — M5412 Radiculopathy, cervical region: Secondary | ICD-10-CM | POA: Diagnosis not present

## 2019-02-14 DIAGNOSIS — M47812 Spondylosis without myelopathy or radiculopathy, cervical region: Secondary | ICD-10-CM | POA: Diagnosis not present

## 2019-02-14 DIAGNOSIS — E1142 Type 2 diabetes mellitus with diabetic polyneuropathy: Secondary | ICD-10-CM | POA: Insufficient documentation

## 2019-02-14 MED ORDER — OXYCODONE HCL 5 MG PO TABS: 5.0000 mg | ORAL_TABLET | Freq: Two times a day (BID) | ORAL | 0 refills | Status: DC | PRN

## 2019-02-14 NOTE — Progress Notes (Signed)
Patient's Name: [name]  MRN: [id number]  Referring Provider: Mikey College, *  DOB: 1966/06/07  PCP: Mikey College, NP  DOS: 02/14/2019  Note by: Gillis Santa, MD  Service setting: Ambulatory outpatient  Specialty: Interventional Pain Management  Location: [facility] (AMB) Pain Management Facility    Patient type: Established   Primary Reason(s) for Visit: Encounter for prescription drug management. (Level of risk: moderate)  CC: Neck Pain; Arm Pain (RIGHT); and Back Pain (LOWER)  HPI  [name] is a 53 y.o. year old, female patient, who comes today for a medication management evaluation. She has Adjustment disorder with mixed disturbance of emotions and conduct; Dysthymia; History of MRSA infection; Acquired palmar and plantar hyperkeratosis; Essential hypertension; Tobacco abuse; ESRD (end stage renal disease) on dialysis ([place]); Diabetic macular edema ([place]); Hypertensive retinopathy of both eyes; Kidney transplant candidate; Non-proliferative diabetic retinopathy, severe, both eyes ([place]); Psoriatic arthropathy ([place]); Diabetes mellitus type 2, uncontrolled, with complications ([place]); SMA stenosis ([place]); Hyperkalemia; Dialysis AV fistula malfunction ([place]); Difficulty walking; Neuropathy due to type 2 diabetes mellitus ([place]); Neck pain; Anxiety; Chronic bilateral low back pain; Stool incontinence; Abdominal pain, chronic, right lower quadrant; Diarrhea; DDD (degenerative disc disease), cervical; Complication from renal dialysis device; Chronic hepatitis C virus genotype 1a infection ([place]); Seasonal allergic rhinitis due to pollen; Chronic hepatitis C without hepatic coma (HCC); and Rheumatoid arthritis involving multiple sites with positive rheumatoid factor (HCC) on their problem list. Her primarily concern today is the Neck Pain; Arm Pain (RIGHT); and Back Pain (LOWER)  Pain Assessment: Location: Right Neck Radiating: through right shoulder, arm and fingers; right pointer and thumb are  numb Onset: More than a month ago Duration: Chronic pain Quality: Numbness, Discomfort, Tingling("right arm feels so heavy I feel like i have to hold it up sometimes") Severity: 6 /10 (subjective, self-reported pain score)  Note: Reported level is compatible with observation.                         When using our objective Pain Scale, levels between 6 and 10/10 are said to belong in an emergency room, as it progressively worsens from a 6/10, described as severely limiting, requiring emergency care not usually available at an outpatient pain management facility. At a 6/10 level, communication becomes difficult and requires great effort. Assistance to reach the emergency department may be required. Facial flushing and profuse sweating along with potentially dangerous increases in heart rate and blood pressure will be evident. Effect on ADL: "unable to grip with right hand, difficult to write" Timing: Constant Modifying factors: "oxycodone takes the edge off" BP: (!) 154/72  HR: 87  [name] was last scheduled for an appointment on 01/24/2019 for medication management. During today's appointment we reviewed [name]'s chronic pain status, as well as her outpatient medication regimen.  The patient  reports no history of drug use. Her body mass index is 34.01 kg/m.  Further details on both, my assessment(s), as well as the proposed treatment plan, please see below.  Controlled Substance Pharmacotherapy Assessment REMS (Risk Evaluation and Mitigation Strategy)   01/30/2019  4   12/27/2018  Oxycodone Hcl 5 MG Tablet  60.00 30 Bi Lat   [id number]   [place] ([id number])    Rise Patience, RN  02/14/2019  9:18 AM  Signed Nursing Pain Medication Assessment:  Safety precautions to be maintained throughout the outpatient stay will include: orient to surroundings, keep bed in low position, maintain call bell within  reach at all times, provide assistance with transfer out of bed and ambulation.  Medication  Inspection Compliance: Pill count conducted under aseptic conditions, in front of the patient. Neither the pills nor the bottle was removed from the patient's sight at any time. Once count was completed pills were immediately returned to the patient in their original bottle.  Medication: Oxycodone IR Pill/Patch Count: 29 of 60 pills remain Pill/Patch Appearance: Markings consistent with prescribed medication Bottle Appearance: Standard pharmacy container. Clearly labeled. Filled Date: 2 / 10 / 2020 Last Medication intake:  Today   Pharmacokinetics: Liberation and absorption (onset of action): WNL Distribution (time to peak effect): WNL Metabolism and excretion (duration of action): WNL         Pharmacodynamics: Desired effects: Analgesia: [name] reports >50% benefit. Functional ability: Patient reports that medication allows her to accomplish basic ADLs Clinically meaningful improvement in function (CMIF): Sustained CMIF goals met Perceived effectiveness: Described as relatively effective, allowing for increase in activities of daily living (ADL) Undesirable effects: Side-effects or Adverse reactions: None reported Monitoring: [place] PMP: Online review of the past 86-monthperiod conducted. Compliant with practice rules and regulations   Ref Range & Units 141mogo [id number]  Amphetamines, IA Cutoff:50 ng/mL Negative  Negative   Barbiturates, [place] Cutoff:0.1 ug/mL Negative  Negative   Benzodiazepines, [place] Cutoff:20 ng/mL Negative  Negative   Cocaine & Metabolite, [place] Cutoff:25 ng/mL Negative  Negative   Phencyclidine, [place] Cutoff:8 ng/mL Negative  Negative   THC(Marijuana) Metabolite, [place] Cutoff:5 ng/mL Negative  Negative   Opiates, [place] Cutoff:5 ng/mL Negative  Negative   Oxycodones, [place] Cutoff:5 ng/mL Negative  Negative   Methadone, [place] Cutoff:25 ng/mL Negative  Negative   Propoxyphene, [place] Cutoff:50 ng/mL Negative  Negative CM  Comment: This test was developed and its performance characteristics   determined by [facility]. It has not been cleared or approved  by the Food and Drug Administration.     UDS interpretation: Compliant          Medication Assessment Form: Reviewed. Patient indicates being compliant with therapy Treatment compliance: Compliant Risk Assessment Profile: Aberrant behavior: See initial evaluations. None observed or detected today Comorbid factors increasing risk of overdose: See initial evaluation. No additional risks detected today Opioid risk tool (ORT):  Opioid Risk  02/14/2019  Alcohol 0  Illegal Drugs 0  Rx Drugs 0  Alcohol 0  Illegal Drugs 0  Rx Drugs 0  Age between 16-45 years  0  History of Preadolescent Sexual Abuse 0  Psychological Disease 0  Depression 0  Opioid Risk Tool Scoring 0  Opioid Risk Interpretation Low Risk    ORT Scoring interpretation table:  Score <3 = Low Risk for SUD  Score between 4-7 = Moderate Risk for SUD  Score >8 = High Risk for Opioid Abuse   Risk of substance use disorder (SUD): Low  Risk Mitigation Strategies:  Patient Counseling: Covered Patient-Prescriber Agreement (PPA): Present and active  Notification to other healthcare providers: Done  Pharmacologic Plan: No change in therapy, at this time.             Laboratory Chemistry  Inflammation Markers (CRP: Acute Phase) (ESR: Chronic Phase) Lab Results  Component Value Date   CRP <0.8 12/16/2016   ESRSEDRATE 117 (H) 10/12/2017   LATICACIDVEN 0.5 01/08/2017                         Rheumatology Markers Lab Results  Component Value Date  ANA Negative 10/12/2017                        Renal Function Markers Lab Results  Component Value Date   BUN 75 (H) 12/27/2017   CREATININE 7.0 (A) 06/13/2018   BCR 10 12/27/2017   GFRAA 7 (L) 12/27/2017   GFRNONAA 6 (L) 12/27/2017                             Hepatic Function Markers Lab Results  Component Value Date   AST 23 04/07/2018   ALT 13 (L) 04/07/2018   ALBUMIN 3.8 04/07/2018   ALKPHOS 7.8  (A) 06/27/2018   HCVAB >11.0 (H) 12/16/2016   LIPASE 40 12/14/2017                        Electrolytes Lab Results  Component Value Date   NA 133 (L) 12/27/2017   K 5.2 (H) 07/15/2018   CL 96 (L) 12/27/2017   CALCIUM 8.7 12/27/2017   MG 2.4 12/14/2017   PHOS 7.6 (H) 05/21/2017   PHOS 8.1 (H) 05/21/2017                        Neuropathy Markers Lab Results  Component Value Date   HGBA1C 7.4 (A) 06/30/2018   HIV Non Reactive 12/15/2017                        CNS Tests No results found for: COLORCSF, APPEARCSF, RBCCOUNTCSF, WBCCSF, POLYSCSF, LYMPHSCSF, EOSCSF, PROTEINCSF, GLUCCSF, JCVIRUS, CSFOLI, IGGCSF                      Bone Pathology Markers No results found for: VD25OH, [id number], [id number], XT0240XB3, 25OHVITD1, 25OHVITD2, 25OHVITD3, TESTOFREE, TESTOSTERONE                       Coagulation Parameters Lab Results  Component Value Date   INR 1.05 04/07/2018   LABPROT 13.6 04/07/2018   APTT 56 (H) 05/19/2017   PLT 193 04/07/2018                        Cardiovascular Markers Lab Results  Component Value Date   BNP 2,289 (H) 04/13/2013   CKTOTAL 425 (H) 10/19/2014   CKMB 0.6 04/14/2013   TROPONINI <0.03 12/14/2017   HGB 11.2 (A) 06/27/2018   HCT 34 (A) 06/27/2018                         CA Markers No results found for: CEA, CA125, LABCA2                      Endocrine Markers Lab Results  Component Value Date   TSH 1.182 12/16/2016                        Note: Lab results reviewed.  Recent Diagnostic Imaging Results  DG C-Arm 1-60 Min-No Report Fluoroscopy was utilized by the requesting physician.  No radiographic  interpretation.   Complexity Note: Imaging results reviewed. Results shared with Ms. Mages, using Layman's terms.                         Meds  Current Outpatient Medications:  .  Adalimumab (HUMIRA PEN [place]), Inject into the skin every 14 (fourteen) days., Disp: , Rfl:  .  amitriptyline (ELAVIL) 10 MG tablet, Take 10 mg by mouth  at bedtime., Disp: , Rfl:  .  atorvastatin (LIPITOR) 20 MG tablet, Take 1 tablet (20 mg total) by mouth daily., Disp: 90 tablet, Rfl: 3 .  calcium acetate (PHOSLO) 667 MG capsule, Take 1,334 mg by mouth 3 (three) times daily with meals. , Disp: , Rfl:  .  clobetasol (OLUX) 0.05 % topical foam, Apply topically 2 (two) times daily., Disp: , Rfl:  .  clobetasol ointment (TEMOVATE) 5.95 %, Apply 1 application topically daily as needed (for psorasis). , Disp: , Rfl:  .  diltiazem (CARDIZEM) 60 MG tablet, Take 1 tablet (60 mg total) by mouth 3 (three) times daily., Disp: 90 tablet, Rfl: 11 .  glucose blood (TRUE METRIX BLOOD GLUCOSE TEST) test strip, Use as instructed, Disp: 100 each, Rfl: 12 .  insulin aspart (NOVOLOG) 100 UNIT/ML injection, Inject 1-2 Units into the skin 3 (three) times daily before meals., Disp: 10 mL, Rfl: 0 .  insulin glargine (LANTUS) 100 UNIT/ML injection, Inject 0.1 mLs (10 Units total) into the skin daily., Disp: 10 mL, Rfl: 0 .  ketoconazole (NIZORAL) 2 % cream, APPLY TOPICALLY ONTO THE SKIN TWICE A DAY AS NEEDED, Disp: , Rfl: 2 .  levocetirizine (XYZAL) 5 MG tablet, TAKE 1 TABLET(5 MG) BY MOUTH EVERY EVENING, Disp: 30 tablet, Rfl: 11 .  loperamide (IMODIUM A-D) 2 MG tablet, Take 2 mg by mouth 3 (three) times daily as needed. , Disp: , Rfl:  .  ondansetron (ZOFRAN) 8 MG tablet, Take by mouth., Disp: , Rfl:  .  [START ON 02/27/2019] oxyCODONE (OXY IR/ROXICODONE) 5 MG immediate release tablet, Take 1 tablet (5 mg total) by mouth 2 (two) times daily as needed for up to 30 days for severe pain., Disp: 60 tablet, Rfl: 0 .  sodium polystyrene (KAYEXALATE) powder, 3 times/day as needed-between meals & bedtime. , Disp: , Rfl: 1 .  torsemide (DEMADEX) 100 MG tablet, Take 100 mg by mouth daily., Disp: , Rfl:  .  ULTICARE INSULIN SYRINGE 29G X 1/2" 0.5 ML MISC, USE 4 TIMES A DAY AS DIRECTED PER MD, Disp: 120 each, Rfl: 5 .  midodrine (PROAMATINE) 5 MG tablet, TK 1 T PO  DURING DIALYSIS  TREATMENT FOR LOW BP, Disp: , Rfl: 5 .  [START ON 03/29/2019] oxyCODONE (OXY IR/ROXICODONE) 5 MG immediate release tablet, Take 1 tablet (5 mg total) by mouth 2 (two) times daily as needed for up to 30 days for severe pain. Must last 30 days., Disp: 60 tablet, Rfl: 0  ROS  Constitutional: Denies any fever or chills Gastrointestinal: No reported hemesis, hematochezia, vomiting, or acute GI distress Musculoskeletal: Denies any acute onset joint swelling, redness, loss of ROM, or weakness Neurological: No reported episodes of acute onset apraxia, aphasia, dysarthria, agnosia, amnesia, paralysis, loss of coordination, or loss of consciousness  Allergies  Ms. Mcdaris is allergic to cinnamon; garlic; onion; tylenol [acetaminophen]; ciprofloxacin; and prednisone.  [place]  Drug: Ms. Goto  reports no history of drug use. Alcohol:  reports no history of alcohol use. Tobacco:  reports that she has been smoking cigarettes. She has a 5.00 pack-year smoking history. She has never used smokeless tobacco. Medical:  has a past medical history of Allergy, Anemia, Anxiety, Arthritis, Ataxia, COPD (chronic obstructive pulmonary disease) ([place]), Depression, Diabetes mellitus with complication ([place]), ESRD on  hemodialysis ([place]), Fistula, Hepatitis (2003), Hiatal hernia, Hypercholesterolemia, Hypertension, Migraine, Neuropathy, diabetic ([place]), Peripheral vascular disease ([place]), Pneumonia (2015), Psoriasis, Tobacco dependence, and Wears dentures. Surgical: Ms. Spitzley  has a past surgical history that includes Tonsillectomy; rt. tubal and ovary removed; Cholecystectomy; Cardiac catheterization (N/A, 07/25/2015); Cardiac catheterization (Left, 07/25/2015); Cardiac catheterization (Left, 10/07/2015); Cardiac catheterization (N/A, 10/07/2015); Cardiac catheterization (10/07/2015); Cardiac catheterization (N/A, 12/17/2015); Incision and drainage abscess (N/A, 07/16/2016); cyst removed  from left hand (Left, 1989); AV fistula placement  (Left, 11/2014); Cardiac catheterization (N/A, 01/11/2017); Cardiac catheterization (N/A, 01/11/2017); DIALYSIS/PERMA CATHETER INSERTION (N/A, 05/20/2017); Cesarean section; Tubal ligation; Colonoscopy (N/A, 09/22/2017); Esophagogastroduodenoscopy (N/A, 09/22/2017); polypectomy (09/22/2017); A/V SHUNT INTERVENTION (N/A, 12/16/2017); and STENT PLACEMENT VASCULAR ([facility] HX) (Left, 03/2018). Family: family history includes Heart disease in her mother; Hypertension in her father and mother; Skin cancer in her father.  Constitutional Exam  General appearance: Well nourished, well developed, and well hydrated. In no apparent acute distress Vitals:   02/14/19 0915  BP: (!) 154/72  Pulse: 87  Resp: 18  Temp: 99 F (37.2 C)  TempSrc: Oral  SpO2: 100%  Weight: 217 lb 2.5 oz (98.5 kg)  Height: '5\' 7"'$  (1.702 m)   BMI Assessment: Estimated body mass index is 34.01 kg/m as calculated from the following:   Height as of this encounter: '5\' 7"'$  (1.702 m).   Weight as of this encounter: 217 lb 2.5 oz (98.5 kg).  BMI interpretation table: BMI level Category Range association with higher incidence of chronic pain  <18 kg/m2 Underweight   18.5-24.9 kg/m2 Ideal body weight   25-29.9 kg/m2 Overweight Increased incidence by 20%  30-34.9 kg/m2 Obese (Class I) Increased incidence by 68%  35-39.9 kg/m2 Severe obesity (Class II) Increased incidence by 136%  >40 kg/m2 Extreme obesity (Class III) Increased incidence by 254%   Patient's current BMI Ideal Body weight  Body mass index is 34.01 kg/m. Ideal body weight: 61.6 kg (135 lb 12.9 oz) Adjusted ideal body weight: 76.4 kg (168 lb 5.5 oz)   BMI Readings from Last 4 Encounters:  02/14/19 34.01 kg/m  01/26/19 34.54 kg/m  12/27/18 35.08 kg/m  12/07/18 35.08 kg/m   Wt Readings from Last 4 Encounters:  02/14/19 217 lb 2.5 oz (98.5 kg)  01/26/19 220 lb 8 oz (100 kg)  12/27/18 224 lb (101.6 kg)  12/07/18 224 lb (101.6 kg)  Psych/Mental status: Alert, oriented  x 3 (person, place, & time)       Eyes: PERLA Respiratory: No evidence of acute respiratory distress  Cervical Spine Area Exam  Skin & Axial Inspection: No masses, redness, edema, swelling, or associated skin lesions Alignment: Symmetrical Functional ROM: Decreased ROM      Stability: No instability detected Muscle Tone/Strength: Functionally intact. No obvious neuro-muscular anomalies detected. Sensory (Neurological): Dermatomal pain pattern C5, C6 Palpation: No palpable anomalies             Positive Spurling's on right Upper Extremity (UE) Exam    Side: Right upper extremity  Side: Left upper extremity  Skin & Extremity Inspection: Skin color, temperature, and hair growth are WNL. No peripheral edema or cyanosis. No masses, redness, swelling, asymmetry, or associated skin lesions. No contractures.  Skin & Extremity Inspection: Skin color, temperature, and hair growth are WNL. No peripheral edema or cyanosis. No masses, redness, swelling, asymmetry, or associated skin lesions. No contractures.  Functional ROM: Pain restricted ROM for shoulder and elbow  Functional ROM: Unrestricted ROM          Muscle  Tone/Strength: Functionally intact. No obvious neuro-muscular anomalies detected.  Muscle Tone/Strength: Functionally intact. No obvious neuro-muscular anomalies detected.  Sensory (Neurological): Dermatomal pain pattern          Sensory (Neurological): Unimpaired          Palpation: No palpable anomalies              Palpation: No palpable anomalies              Provocative Test(s):  Phalen's test: deferred Tinel's test: deferred Apley's scratch test (touch opposite shoulder):  Action 1 (Across chest): Decreased ROM Action 2 (Overhead): Decreased ROM Action 3 (LB reach): Decreased ROM   Provocative Test(s):  Phalen's test: deferred Tinel's test: deferred Apley's scratch test (touch opposite shoulder):  Action 1 (Across chest): deferred Action 2 (Overhead): deferred Action 3 (LB  reach): deferred    Thoracic Spine Area Exam  Skin & Axial Inspection: No masses, redness, or swelling Alignment: Symmetrical Functional ROM: Unrestricted ROM Stability: No instability detected Muscle Tone/Strength: Functionally intact. No obvious neuro-muscular anomalies detected. Sensory (Neurological): Unimpaired Muscle strength & Tone: No palpable anomalies  Lumbar Spine Area Exam  Skin & Axial Inspection: No masses, redness, or swelling Alignment: Symmetrical Functional ROM: Pain restricted ROM       Stability: No instability detected Muscle Tone/Strength: Functionally intact. No obvious neuro-muscular anomalies detected. Sensory (Neurological): Musculoskeletal pain pattern Palpation: No palpable anomalies       Provocative Tests: Hyperextension/rotation test: (+) due to pain. Lumbar quadrant test (Kemp's test): deferred today       Lateral bending test: deferred today       Patrick's Maneuver: deferred today                   FABER* test: deferred today                   S-I anterior distraction/compression test: deferred today         S-I lateral compression test: deferred today         S-I Thigh-thrust test: deferred today         S-I Gaenslen's test: deferred today         *(Flexion, ABduction and External Rotation)  Gait & Posture Assessment  Ambulation: Unassisted Gait: Relatively normal for age and body habitus Posture: WNL   Lower Extremity Exam    Side: Right lower extremity  Side: Left lower extremity  Stability: No instability observed          Stability: No instability observed          Skin & Extremity Inspection: Skin color, temperature, and hair growth are WNL. No peripheral edema or cyanosis. No masses, redness, swelling, asymmetry, or associated skin lesions. No contractures.  Skin & Extremity Inspection: Skin color, temperature, and hair growth are WNL. No peripheral edema or cyanosis. No masses, redness, swelling, asymmetry, or associated skin lesions.  No contractures.  Functional ROM: Unrestricted ROM                  Functional ROM: Unrestricted ROM                  Muscle Tone/Strength: Functionally intact. No obvious neuro-muscular anomalies detected.  Muscle Tone/Strength: Functionally intact. No obvious neuro-muscular anomalies detected.  Sensory (Neurological): Unimpaired        Sensory (Neurological): Unimpaired        DTR: Patellar: deferred today Achilles: deferred today Plantar: deferred today  DTR: Patellar: deferred today  Achilles: deferred today Plantar: deferred today  Palpation: No palpable anomalies  Palpation: No palpable anomalies   Assessment   Status Diagnosis  Persistent Persistent Persistent 1. Cervical radiculopathy   2. Chronic pain syndrome   3. Diabetic polyneuropathy associated with type 2 diabetes mellitus (HCC)   4. Spondylosis of cervical region without myelopathy or radiculopathy   5. Cervicalgia      General Recommendations: The pain condition that the patient suffers from is best treated with a multidisciplinary approach that involves an increase in physical activity to prevent de-conditioning and worsening of the pain cycle, as well as psychological counseling (formal and/or informal) to address the co-morbid psychological affects of pain. Treatment will often involve judicious use of pain medications and interventional procedures to decrease the pain, allowing the patient to participate in the physical activity that will ultimately produce long-lasting pain reductions. The goal of the multidisciplinary approach is to return the patient to a higher level of overall function and to restore their ability to perform activities of daily living.  53 year old female with a complex medical history including type 2 diabetes, end-stage renal disease on dialysis with persistent and worsening right neck and right arm pain secondary to cervical radiculopathy and foraminal stenosis.  Right arm pain could also be  secondary to vascular insufficiency which I recommended she follow-up with her vascular surgeon.  Patient is status post right C7-T1 cervical epidural steroid injection which did not provide any significant pain relief in regards to her right radicular symptoms.  Patient did notice elevated blood sugars for 3 to 4 days after her procedure with reduction back to baseline thereafter.  Patient has cervical MRI that reveals diffuse cervical degenerative disc disease and foraminal stenosis at multiple levels on the right.  Patient has already seen one neurosurgeon and was deemed high risk for cervical decompression.  Given worsening symptoms, patient is interested in second opinion which I think is reasonable as well given that we have exhausted many options including physical therapy, multiple medication trials including membrane stabilizers (gabapentin, Lyrica, Cymbalta, nortriptyline, currently on amitriptyline) as well as interventional options including cervical ESI.  Given the patient's worsening pain resulting in severe discomfort during dialysis, we discussed low-dose opioid therapy that she can utilize on her dialysis days to help her get through her dialysis sessions.    Patient has been doing this and states that it is helpful in managing her increased pain after dialysis.  Today patient presents for medication refill.  She states that she still has not heard back from [facility] neurosurgery regarding her referral.  I instructed the patient that we will have our clinic staff call Dr. Tommy Medal Lim's office for consultation.  Interventional history: Right C7-T1 ESI on 12/07/2018, not effective, resulted in increased blood sugars.  Plan of Care  Pharmacotherapy (Medications Ordered): Meds ordered this encounter  Medications  . oxyCODONE (OXY IR/ROXICODONE) 5 MG immediate release tablet    Sig: Take 1 tablet (5 mg total) by mouth 2 (two) times daily as needed for up to 30 days for severe pain.    Dispense:  60  tablet    Refill:  0    Do not place this medication, or any other prescription from our practice, on "Automatic Refill". Patient may have prescription filled one day early if pharmacy is closed on scheduled refill date.  Marland Kitchen oxyCODONE (OXY IR/ROXICODONE) 5 MG immediate release tablet    Sig: Take 1 tablet (5 mg total) by mouth 2 (two) times daily as  needed for up to 30 days for severe pain. Must last 30 days.    Dispense:  60 tablet    Refill:  0    [place] STOP ACT - Not applicable. Fill one day early if pharmacy is closed on scheduled refill date.. Must last 30 days.     Provider-requested follow-up: Return in about 8 weeks (around 04/11/2019) for Medication Management.  Primary Care Physician: Mikey College, NP Location: [facility] Facility Note by: Gillis Santa, M.D Date: 02/14/2019; Time: 9:50 AM  Patient Instructions  Please have front desk staff call Dr. Demetra Shiner [facility] neurosurgery) to see how to coordinate referral.  Referral has been placed twice in the past and patient has not heard back.

## 2019-02-14 NOTE — Patient Instructions (Addendum)
Please have front desk staff call Dr. Tommy Medal Lim's [facility] neurosurgery) to see how to coordinate referral.  Referral has been placed twice in the past and patient has not heard back.  Oxycodone to last until 04/28/2019 has been escribed to your pharmacy.

## 2019-02-14 NOTE — Progress Notes (Signed)
Nursing Pain Medication Assessment:  Safety precautions to be maintained throughout the outpatient stay will include: orient to surroundings, keep bed in low position, maintain call bell within reach at all times, provide assistance with transfer out of bed and ambulation.  Medication Inspection Compliance: Pill count conducted under aseptic conditions, in front of the patient. Neither the pills nor the bottle was removed from the patient's sight at any time. Once count was completed pills were immediately returned to the patient in their original bottle.  Medication: Oxycodone IR Pill/Patch Count: 29 of 60 pills remain Pill/Patch Appearance: Markings consistent with prescribed medication Bottle Appearance: Standard pharmacy container. Clearly labeled. Filled Date: 2 / 10 / 2020 Last Medication intake:  Today

## 2019-02-15 DIAGNOSIS — Z992 Dependence on renal dialysis: Secondary | ICD-10-CM | POA: Diagnosis not present

## 2019-02-15 DIAGNOSIS — N186 End stage renal disease: Secondary | ICD-10-CM | POA: Diagnosis not present

## 2019-02-15 DIAGNOSIS — D509 Iron deficiency anemia, unspecified: Secondary | ICD-10-CM | POA: Diagnosis not present

## 2019-02-15 DIAGNOSIS — N2581 Secondary hyperparathyroidism of renal origin: Secondary | ICD-10-CM | POA: Diagnosis not present

## 2019-02-17 DIAGNOSIS — Z992 Dependence on renal dialysis: Secondary | ICD-10-CM | POA: Diagnosis not present

## 2019-02-17 DIAGNOSIS — D509 Iron deficiency anemia, unspecified: Secondary | ICD-10-CM | POA: Diagnosis not present

## 2019-02-17 DIAGNOSIS — N186 End stage renal disease: Secondary | ICD-10-CM | POA: Diagnosis not present

## 2019-02-17 DIAGNOSIS — N2581 Secondary hyperparathyroidism of renal origin: Secondary | ICD-10-CM | POA: Diagnosis not present

## 2019-02-18 DIAGNOSIS — N186 End stage renal disease: Secondary | ICD-10-CM | POA: Diagnosis not present

## 2019-02-18 DIAGNOSIS — Z992 Dependence on renal dialysis: Secondary | ICD-10-CM | POA: Diagnosis not present

## 2019-02-20 DIAGNOSIS — D509 Iron deficiency anemia, unspecified: Secondary | ICD-10-CM | POA: Diagnosis not present

## 2019-02-20 DIAGNOSIS — Z992 Dependence on renal dialysis: Secondary | ICD-10-CM | POA: Diagnosis not present

## 2019-02-20 DIAGNOSIS — N186 End stage renal disease: Secondary | ICD-10-CM | POA: Diagnosis not present

## 2019-02-20 DIAGNOSIS — D631 Anemia in chronic kidney disease: Secondary | ICD-10-CM | POA: Diagnosis not present

## 2019-02-20 DIAGNOSIS — N2581 Secondary hyperparathyroidism of renal origin: Secondary | ICD-10-CM | POA: Diagnosis not present

## 2019-02-21 ENCOUNTER — Encounter: Payer: Medicare Other | Admitting: Student in an Organized Health Care Education/Training Program

## 2019-02-22 ENCOUNTER — Other Ambulatory Visit: Payer: Self-pay

## 2019-02-22 ENCOUNTER — Encounter: Payer: Self-pay | Admitting: Nurse Practitioner

## 2019-02-22 ENCOUNTER — Ambulatory Visit (INDEPENDENT_AMBULATORY_CARE_PROVIDER_SITE_OTHER): Payer: Medicare Other | Admitting: Nurse Practitioner

## 2019-02-22 VITALS — BP 129/74 | HR 80 | Temp 98.3°F | Ht 67.0 in | Wt 219.2 lb

## 2019-02-22 DIAGNOSIS — Z992 Dependence on renal dialysis: Secondary | ICD-10-CM | POA: Diagnosis not present

## 2019-02-22 DIAGNOSIS — E1165 Type 2 diabetes mellitus with hyperglycemia: Secondary | ICD-10-CM | POA: Diagnosis not present

## 2019-02-22 DIAGNOSIS — E118 Type 2 diabetes mellitus with unspecified complications: Secondary | ICD-10-CM

## 2019-02-22 DIAGNOSIS — K580 Irritable bowel syndrome with diarrhea: Secondary | ICD-10-CM | POA: Diagnosis not present

## 2019-02-22 DIAGNOSIS — B372 Candidiasis of skin and nail: Secondary | ICD-10-CM

## 2019-02-22 DIAGNOSIS — IMO0002 Reserved for concepts with insufficient information to code with codable children: Secondary | ICD-10-CM

## 2019-02-22 DIAGNOSIS — B356 Tinea cruris: Secondary | ICD-10-CM | POA: Diagnosis not present

## 2019-02-22 DIAGNOSIS — L22 Diaper dermatitis: Secondary | ICD-10-CM

## 2019-02-22 DIAGNOSIS — K589 Irritable bowel syndrome without diarrhea: Secondary | ICD-10-CM | POA: Insufficient documentation

## 2019-02-22 DIAGNOSIS — N186 End stage renal disease: Secondary | ICD-10-CM

## 2019-02-22 DIAGNOSIS — D631 Anemia in chronic kidney disease: Secondary | ICD-10-CM | POA: Diagnosis not present

## 2019-02-22 DIAGNOSIS — D509 Iron deficiency anemia, unspecified: Secondary | ICD-10-CM | POA: Diagnosis not present

## 2019-02-22 DIAGNOSIS — N2581 Secondary hyperparathyroidism of renal origin: Secondary | ICD-10-CM | POA: Diagnosis not present

## 2019-02-22 MED ORDER — BARRIER CREAM NON-SPECIFIED: 1.0000 "application " | TOPICAL_CREAM | Freq: Two times a day (BID) | TOPICAL | 5 refills | Status: DC | PRN

## 2019-02-22 MED ORDER — KETOCONAZOLE 2 % EX CREA: TOPICAL_CREAM | CUTANEOUS | 5 refills | Status: DC

## 2019-02-22 NOTE — Progress Notes (Signed)
Subjective:    Patient ID: [name], female    DOB: 1966-01-07, 53 y.o.   MRN: [id number]  [name] is a 53 y.o. female presenting on 02/22/2019 for Disability (FL2 forms )   HPI Diabetes Pt presents today for follow up of Type 2 diabetes mellitus. She is checking fasting am CBG at home with a range of   - Current diabetic medications include: Lantus 10 units daily and Humalog 1-2 units with meals for carb servings of 1 or 2+. When patient eats 1 carb serving gives 1 unit, 2 or more carb servings gives 2 units.  Is trying to eat more vegetables and moderate protein with very low carb for weight loss - She is not currently symptomatic.  - She denies polydipsia, polyphagia, polyuria, headaches, diaphoresis, shakiness, chills and changes in vision.   - Clinical course has been stable per patient report of lab checks at Dialysis. - She  reports no regular exercise routine. - Her diet is moderate in salt, moderate in fat, and low in carbohydrates. - Weight trend: decreasing steadily.  Patient reports having had weight gain up to 225 lbs.  New dry weight at HD is 98.5 kg.  She continues to lose weight and left at 98.2 kg for last week due to other weight loss.  Patient notes they are challenging her dry weight to continue with weight loss.  Patient is eating a very low carb diet to lose weight.   PREVENTION: Eye exam current (within one year): yes Foot exam current (within one year): no  Lipid/ASCVD risk reduction - on statin: atorvastatin 20 mg daily Kidney protection - on ace or arb: n/a - patient has ESRD on HD Recent Labs    03/29/18 1042 06/30/18 1202  HGBA1C 7.0 7.4*   7.4 reported from patient at last check with HD about 2 months ago.   Chronic Pain Continues on oxycodone for neck/arm pain.  Now on amitriptyline for sleep/pain.  Remains managed by Dr. Hazle [facility] Patient receives monetary assistance to continue to live independently.  No PCS at this time.   Patient continues to be able to live independently and takes care of all ADLs/iADLs, arranging transportation when needed as she does not drive   Social History   Tobacco Use  . Smoking status: Light Tobacco Smoker    Packs/day: 0.25    Years: 20.00    Pack years: 5.00    Types: Cigarettes    Last attempt to quit: 05/21/2017    Years since quitting: 1.7  . Smokeless tobacco: Never Used  Substance Use Topics  . Alcohol use: No  . Drug use: No    Review of Systems Per HPI unless specifically indicated above     Objective:    BP 129/74 (BP Location: Right Arm, Patient Position: Sitting, Cuff Size: Large)   Pulse 80   Temp 98.3 F (36.8 C) (Oral)   Ht 5\' 7"  (1.702 m)   Wt 219 lb 3.2 oz (99.4 kg)   BMI 34.33 kg/m   Wt Readings from Last 3 Encounters:  02/22/19 219 lb 3.2 oz (99.4 kg)  02/14/19 217 lb 2.5 oz (98.5 kg)  01/26/19 220 lb 8 oz (100 kg)    Physical Exam Vitals signs reviewed.  Constitutional:      General: She is awake. She is not in acute distress.    Appearance: She is well-developed.  HENT:     Head: Normocephalic and atraumatic.  Mouth/Throat:     Mouth: Mucous membranes are moist.     Pharynx: Oropharynx is clear.  Neck:     Musculoskeletal: Normal range of motion and neck supple.     Vascular: No carotid bruit.  Cardiovascular:     Rate and Rhythm: Normal rate and regular rhythm.     Pulses:          Radial pulses are 2+ on the right side and 2+ on the left side.       Posterior tibial pulses are 1+ on the right side and 1+ on the left side.     Heart sounds: Normal heart sounds, S1 normal and S2 normal.  Pulmonary:     Effort: Pulmonary effort is normal. No respiratory distress.     Breath sounds: Normal breath sounds and air entry.  Abdominal:     General: Abdomen is flat. Bowel sounds are normal. There is no distension.     Palpations: Abdomen is soft.     Tenderness: There is no abdominal tenderness.     Hernia: No hernia is present.    Musculoskeletal:     Right lower leg: No edema.     Left lower leg: No edema.  Skin:    General: Skin is warm and dry.     Capillary Refill: Capillary refill takes less than 2 seconds.  Neurological:     Mental Status: She is alert and oriented to person, place, and time.     Gait: Gait abnormal (antalgic, walks w cane).  Psychiatric:        Attention and Perception: Attention normal.        Mood and Affect: Mood and affect normal.        Behavior: Behavior normal. Behavior is cooperative.        Thought Content: Thought content normal.        Judgment: Judgment normal.    Diabetic Foot Exam - Simple   Simple Foot Form Diabetic Foot exam was performed with the following findings:  Yes 02/22/2019  8:30 AM  Visual Inspection See comments:  Yes Sensation Testing See comments:  Yes Pulse Check See comments:  Yes Comments Patient continues to have callus/thickened skin plantar foot at MTP joints of 3-5th toes LEFT foot.  Mild improvement of callus over last 1 year due to proper footwear. - Limited foot motion with inability to dorsiflex, invert/evert foot to full range.   - Severe peripheral neuropathy without any sensation of monofilament below 2.5 inches from tibial plateau bliaterally.      Results for orders placed or performed in visit on 12/27/18  Drug Screen 10 W/Conf, Serum  Result Value Ref Range   Amphetamines, IA Negative Cutoff:50 ng/mL   Barbiturates, IA Negative Cutoff:0.1 ug/mL   Benzodiazepines, IA Negative Cutoff:20 ng/mL   Cocaine & Metabolite, IA Negative Cutoff:25 ng/mL   Phencyclidine, IA Negative Cutoff:8 ng/mL   THC(Marijuana) Metabolite, IA Negative Cutoff:5 ng/mL   Opiates, IA Negative Cutoff:5 ng/mL   Oxycodones, IA Negative Cutoff:5 ng/mL   Methadone, IA Negative Cutoff:25 ng/mL   Propoxyphene, IA Negative Cutoff:50 ng/mL      Assessment & Plan:   Problem List Items Addressed This Visit      Digestive   IBS (irritable bowel syndrome)      Endocrine   Diabetes mellitus type 2, uncontrolled, with complications (HCC) Stable today on exam.  Medications tolerated without side effects.  Continue at current doses.  Refills provided.  Discussed changing meal insulin  to GLP1 for future. Pt declines at this time. Request labs from HD/nephrology. Followup 3 months.      Genitourinary   ESRD (end stage renal disease) on dialysis ([place]) Stable today on exam.  Medications tolerated without side effects.  Continue at current doses.  Continue w/ HD and nephrology followup.    Other Visit Diagnoses    Tinea of groin    -  Primary Patient with candidal diaper rash, requesting refill of ketoconazole cream.  Refill provided.  Patient also not using moisture barrier - recommended patient start using when not needing ketoconazole.  Follow-up prn.  Skin currently intact without ulceration or infection.   Relevant Medications   ketoconazole (NIZORAL) 2 % cream   Candidal diaper rash       Relevant Medications   ketoconazole (NIZORAL) 2 % cream   barrier cream (NON-SPECIFIED) CREA      Meds ordered this encounter  Medications  . ketoconazole (NIZORAL) 2 % cream    Sig: APPLY TOPICALLY ONTO THE SKIN TWICE A DAY AS NEEDED    Dispense:  15 g    Refill:  5    Order Specific Question:   Supervising Provider    Answer:   Olin Hauser [[id number]]  . barrier cream (NON-SPECIFIED) CREA    Sig: Apply 1 application topically 2 (two) times daily as needed (to prevent diaper rash).    Dispense:  2 each    Refill:  5    Order Specific Question:   Supervising Provider    Answer:   Olin Hauser [[id number]]   FL2 form completion: remain in domicilary - independent living.  Follow up plan: Return in about 3 months (around 05/25/2019) for diabetes.  Cassell Smiles, DNP, AGPCNP-BC Adult Gerontology Primary Care Nurse Practitioner [facility] 02/22/2019, 8:22 AM

## 2019-02-22 NOTE — Patient Instructions (Addendum)
[name],   Thank you for coming in to clinic today.  1. Continue healthy eating  2. Continue some walking when able.  3. No changes to medications for diabetes. - Consider adding Victoza in future instead of Humalog.  Would also allow [place] to reduce Lantus most likely.   Biggest caution is it slows stomach emptying time and can cause nausea.  Please schedule a follow-up appointment with Cassell Smiles, AGNP. Return in about 3 months (around 05/25/2019) for diabetes.  If you have any other questions or concerns, please feel free to call the clinic or send a message through [place]. You may also schedule an earlier appointment if necessary.  You will receive a survey after today's visit either digitally by e-mail or paper by [employer]. Your experiences and feedback matter to [place].  Please respond so we know how we are doing as we provide care for you.   Cassell Smiles, DNP, AGNP-BC Adult Gerontology Nurse Practitioner [place]

## 2019-02-24 DIAGNOSIS — N186 End stage renal disease: Secondary | ICD-10-CM | POA: Diagnosis not present

## 2019-02-24 DIAGNOSIS — D631 Anemia in chronic kidney disease: Secondary | ICD-10-CM | POA: Diagnosis not present

## 2019-02-24 DIAGNOSIS — Z992 Dependence on renal dialysis: Secondary | ICD-10-CM | POA: Diagnosis not present

## 2019-02-24 DIAGNOSIS — N2581 Secondary hyperparathyroidism of renal origin: Secondary | ICD-10-CM | POA: Diagnosis not present

## 2019-02-24 DIAGNOSIS — D509 Iron deficiency anemia, unspecified: Secondary | ICD-10-CM | POA: Diagnosis not present

## 2019-02-27 DIAGNOSIS — D631 Anemia in chronic kidney disease: Secondary | ICD-10-CM | POA: Diagnosis not present

## 2019-02-27 DIAGNOSIS — N186 End stage renal disease: Secondary | ICD-10-CM | POA: Diagnosis not present

## 2019-02-27 DIAGNOSIS — Z992 Dependence on renal dialysis: Secondary | ICD-10-CM | POA: Diagnosis not present

## 2019-02-27 DIAGNOSIS — N2581 Secondary hyperparathyroidism of renal origin: Secondary | ICD-10-CM | POA: Diagnosis not present

## 2019-02-27 DIAGNOSIS — D509 Iron deficiency anemia, unspecified: Secondary | ICD-10-CM | POA: Diagnosis not present

## 2019-02-28 DIAGNOSIS — H35033 Hypertensive retinopathy, bilateral: Secondary | ICD-10-CM | POA: Diagnosis not present

## 2019-02-28 DIAGNOSIS — H4311 Vitreous hemorrhage, right eye: Secondary | ICD-10-CM | POA: Diagnosis not present

## 2019-03-01 ENCOUNTER — Encounter: Payer: Self-pay | Admitting: Nurse Practitioner

## 2019-03-01 DIAGNOSIS — D631 Anemia in chronic kidney disease: Secondary | ICD-10-CM | POA: Diagnosis not present

## 2019-03-01 DIAGNOSIS — Z992 Dependence on renal dialysis: Secondary | ICD-10-CM | POA: Diagnosis not present

## 2019-03-01 DIAGNOSIS — N2581 Secondary hyperparathyroidism of renal origin: Secondary | ICD-10-CM | POA: Diagnosis not present

## 2019-03-01 DIAGNOSIS — D509 Iron deficiency anemia, unspecified: Secondary | ICD-10-CM | POA: Diagnosis not present

## 2019-03-01 DIAGNOSIS — N186 End stage renal disease: Secondary | ICD-10-CM | POA: Diagnosis not present

## 2019-03-03 DIAGNOSIS — N2581 Secondary hyperparathyroidism of renal origin: Secondary | ICD-10-CM | POA: Diagnosis not present

## 2019-03-03 DIAGNOSIS — D631 Anemia in chronic kidney disease: Secondary | ICD-10-CM | POA: Diagnosis not present

## 2019-03-03 DIAGNOSIS — N186 End stage renal disease: Secondary | ICD-10-CM | POA: Diagnosis not present

## 2019-03-03 DIAGNOSIS — D509 Iron deficiency anemia, unspecified: Secondary | ICD-10-CM | POA: Diagnosis not present

## 2019-03-03 DIAGNOSIS — Z992 Dependence on renal dialysis: Secondary | ICD-10-CM | POA: Diagnosis not present

## 2019-03-06 DIAGNOSIS — N2581 Secondary hyperparathyroidism of renal origin: Secondary | ICD-10-CM | POA: Diagnosis not present

## 2019-03-06 DIAGNOSIS — N186 End stage renal disease: Secondary | ICD-10-CM | POA: Diagnosis not present

## 2019-03-06 DIAGNOSIS — D509 Iron deficiency anemia, unspecified: Secondary | ICD-10-CM | POA: Diagnosis not present

## 2019-03-06 DIAGNOSIS — Z992 Dependence on renal dialysis: Secondary | ICD-10-CM | POA: Diagnosis not present

## 2019-03-06 DIAGNOSIS — D631 Anemia in chronic kidney disease: Secondary | ICD-10-CM | POA: Diagnosis not present

## 2019-03-08 DIAGNOSIS — Z992 Dependence on renal dialysis: Secondary | ICD-10-CM | POA: Diagnosis not present

## 2019-03-08 DIAGNOSIS — D509 Iron deficiency anemia, unspecified: Secondary | ICD-10-CM | POA: Diagnosis not present

## 2019-03-08 DIAGNOSIS — N186 End stage renal disease: Secondary | ICD-10-CM | POA: Diagnosis not present

## 2019-03-08 DIAGNOSIS — D631 Anemia in chronic kidney disease: Secondary | ICD-10-CM | POA: Diagnosis not present

## 2019-03-08 DIAGNOSIS — N2581 Secondary hyperparathyroidism of renal origin: Secondary | ICD-10-CM | POA: Diagnosis not present

## 2019-03-10 DIAGNOSIS — D509 Iron deficiency anemia, unspecified: Secondary | ICD-10-CM | POA: Diagnosis not present

## 2019-03-10 DIAGNOSIS — D631 Anemia in chronic kidney disease: Secondary | ICD-10-CM | POA: Diagnosis not present

## 2019-03-10 DIAGNOSIS — N2581 Secondary hyperparathyroidism of renal origin: Secondary | ICD-10-CM | POA: Diagnosis not present

## 2019-03-10 DIAGNOSIS — N186 End stage renal disease: Secondary | ICD-10-CM | POA: Diagnosis not present

## 2019-03-10 DIAGNOSIS — Z992 Dependence on renal dialysis: Secondary | ICD-10-CM | POA: Diagnosis not present

## 2019-03-13 DIAGNOSIS — D509 Iron deficiency anemia, unspecified: Secondary | ICD-10-CM | POA: Diagnosis not present

## 2019-03-13 DIAGNOSIS — N2581 Secondary hyperparathyroidism of renal origin: Secondary | ICD-10-CM | POA: Diagnosis not present

## 2019-03-13 DIAGNOSIS — N186 End stage renal disease: Secondary | ICD-10-CM | POA: Diagnosis not present

## 2019-03-13 DIAGNOSIS — Z992 Dependence on renal dialysis: Secondary | ICD-10-CM | POA: Diagnosis not present

## 2019-03-13 DIAGNOSIS — D631 Anemia in chronic kidney disease: Secondary | ICD-10-CM | POA: Diagnosis not present

## 2019-03-15 DIAGNOSIS — Z992 Dependence on renal dialysis: Secondary | ICD-10-CM | POA: Diagnosis not present

## 2019-03-15 DIAGNOSIS — N2581 Secondary hyperparathyroidism of renal origin: Secondary | ICD-10-CM | POA: Diagnosis not present

## 2019-03-15 DIAGNOSIS — N186 End stage renal disease: Secondary | ICD-10-CM | POA: Diagnosis not present

## 2019-03-15 DIAGNOSIS — D631 Anemia in chronic kidney disease: Secondary | ICD-10-CM | POA: Diagnosis not present

## 2019-03-15 DIAGNOSIS — D509 Iron deficiency anemia, unspecified: Secondary | ICD-10-CM | POA: Diagnosis not present

## 2019-03-17 DIAGNOSIS — N186 End stage renal disease: Secondary | ICD-10-CM | POA: Diagnosis not present

## 2019-03-17 DIAGNOSIS — N2581 Secondary hyperparathyroidism of renal origin: Secondary | ICD-10-CM | POA: Diagnosis not present

## 2019-03-17 DIAGNOSIS — D631 Anemia in chronic kidney disease: Secondary | ICD-10-CM | POA: Diagnosis not present

## 2019-03-17 DIAGNOSIS — D509 Iron deficiency anemia, unspecified: Secondary | ICD-10-CM | POA: Diagnosis not present

## 2019-03-17 DIAGNOSIS — Z992 Dependence on renal dialysis: Secondary | ICD-10-CM | POA: Diagnosis not present

## 2019-03-20 DIAGNOSIS — D631 Anemia in chronic kidney disease: Secondary | ICD-10-CM | POA: Diagnosis not present

## 2019-03-20 DIAGNOSIS — N186 End stage renal disease: Secondary | ICD-10-CM | POA: Diagnosis not present

## 2019-03-20 DIAGNOSIS — N2581 Secondary hyperparathyroidism of renal origin: Secondary | ICD-10-CM | POA: Diagnosis not present

## 2019-03-20 DIAGNOSIS — Z992 Dependence on renal dialysis: Secondary | ICD-10-CM | POA: Diagnosis not present

## 2019-03-20 DIAGNOSIS — D509 Iron deficiency anemia, unspecified: Secondary | ICD-10-CM | POA: Diagnosis not present

## 2019-03-21 DIAGNOSIS — Z992 Dependence on renal dialysis: Secondary | ICD-10-CM | POA: Diagnosis not present

## 2019-03-21 DIAGNOSIS — N186 End stage renal disease: Secondary | ICD-10-CM | POA: Diagnosis not present

## 2019-03-22 DIAGNOSIS — N2581 Secondary hyperparathyroidism of renal origin: Secondary | ICD-10-CM | POA: Diagnosis not present

## 2019-03-22 DIAGNOSIS — N186 End stage renal disease: Secondary | ICD-10-CM | POA: Diagnosis not present

## 2019-03-22 DIAGNOSIS — D631 Anemia in chronic kidney disease: Secondary | ICD-10-CM | POA: Diagnosis not present

## 2019-03-22 DIAGNOSIS — Z992 Dependence on renal dialysis: Secondary | ICD-10-CM | POA: Diagnosis not present

## 2019-03-22 DIAGNOSIS — D509 Iron deficiency anemia, unspecified: Secondary | ICD-10-CM | POA: Diagnosis not present

## 2019-03-24 DIAGNOSIS — Z992 Dependence on renal dialysis: Secondary | ICD-10-CM | POA: Diagnosis not present

## 2019-03-24 DIAGNOSIS — D631 Anemia in chronic kidney disease: Secondary | ICD-10-CM | POA: Diagnosis not present

## 2019-03-24 DIAGNOSIS — N2581 Secondary hyperparathyroidism of renal origin: Secondary | ICD-10-CM | POA: Diagnosis not present

## 2019-03-24 DIAGNOSIS — D509 Iron deficiency anemia, unspecified: Secondary | ICD-10-CM | POA: Diagnosis not present

## 2019-03-24 DIAGNOSIS — N186 End stage renal disease: Secondary | ICD-10-CM | POA: Diagnosis not present

## 2019-03-27 ENCOUNTER — Other Ambulatory Visit: Payer: Self-pay

## 2019-03-27 DIAGNOSIS — N2581 Secondary hyperparathyroidism of renal origin: Secondary | ICD-10-CM | POA: Diagnosis not present

## 2019-03-27 DIAGNOSIS — D631 Anemia in chronic kidney disease: Secondary | ICD-10-CM | POA: Diagnosis not present

## 2019-03-27 DIAGNOSIS — Z992 Dependence on renal dialysis: Secondary | ICD-10-CM | POA: Diagnosis not present

## 2019-03-27 DIAGNOSIS — D509 Iron deficiency anemia, unspecified: Secondary | ICD-10-CM | POA: Diagnosis not present

## 2019-03-27 DIAGNOSIS — N186 End stage renal disease: Secondary | ICD-10-CM | POA: Diagnosis not present

## 2019-03-27 MED ORDER — "INSULIN SYRINGE 29G X 1/2"" 0.5 ML MISC": 4 refills | Status: DC

## 2019-03-29 DIAGNOSIS — D631 Anemia in chronic kidney disease: Secondary | ICD-10-CM | POA: Diagnosis not present

## 2019-03-29 DIAGNOSIS — N2581 Secondary hyperparathyroidism of renal origin: Secondary | ICD-10-CM | POA: Diagnosis not present

## 2019-03-29 DIAGNOSIS — Z992 Dependence on renal dialysis: Secondary | ICD-10-CM | POA: Diagnosis not present

## 2019-03-29 DIAGNOSIS — N186 End stage renal disease: Secondary | ICD-10-CM | POA: Diagnosis not present

## 2019-03-29 DIAGNOSIS — D509 Iron deficiency anemia, unspecified: Secondary | ICD-10-CM | POA: Diagnosis not present

## 2019-03-30 ENCOUNTER — Other Ambulatory Visit: Payer: Self-pay | Admitting: Nurse Practitioner

## 2019-03-30 ENCOUNTER — Telehealth: Payer: Self-pay | Admitting: Nurse Practitioner

## 2019-03-30 DIAGNOSIS — E118 Type 2 diabetes mellitus with unspecified complications: Principal | ICD-10-CM

## 2019-03-30 DIAGNOSIS — IMO0002 Reserved for concepts with insufficient information to code with codable children: Secondary | ICD-10-CM

## 2019-03-30 DIAGNOSIS — E1165 Type 2 diabetes mellitus with hyperglycemia: Secondary | ICD-10-CM

## 2019-03-30 MED ORDER — INSULIN GLARGINE 100 UNIT/ML ~~LOC~~ SOLN: 10.0000 [IU] | Freq: Every day | SUBCUTANEOUS | 3 refills | Status: DC

## 2019-03-30 MED ORDER — INSULIN ASPART 100 UNIT/ML ~~LOC~~ SOLN: 1.0000 [IU] | Freq: Three times a day (TID) | SUBCUTANEOUS | 3 refills | Status: DC

## 2019-03-30 NOTE — Telephone Encounter (Signed)
Please call regarding Diltiazem dosage.

## 2019-03-30 NOTE — Telephone Encounter (Signed)
Spoke with pharmacist. On 01/26/19 they received prescription for diltiazem extended release and regular. Needed to clarify. It appears we cancelled the extended release on our end and it should be the regular three times a day. She verbalized understanding.

## 2019-03-30 NOTE — Telephone Encounter (Signed)
[facility], novology

## 2019-03-31 DIAGNOSIS — Z992 Dependence on renal dialysis: Secondary | ICD-10-CM | POA: Diagnosis not present

## 2019-03-31 DIAGNOSIS — D631 Anemia in chronic kidney disease: Secondary | ICD-10-CM | POA: Diagnosis not present

## 2019-03-31 DIAGNOSIS — N2581 Secondary hyperparathyroidism of renal origin: Secondary | ICD-10-CM | POA: Diagnosis not present

## 2019-03-31 DIAGNOSIS — D509 Iron deficiency anemia, unspecified: Secondary | ICD-10-CM | POA: Diagnosis not present

## 2019-03-31 DIAGNOSIS — N186 End stage renal disease: Secondary | ICD-10-CM | POA: Diagnosis not present

## 2019-04-03 DIAGNOSIS — Z992 Dependence on renal dialysis: Secondary | ICD-10-CM | POA: Diagnosis not present

## 2019-04-03 DIAGNOSIS — N2581 Secondary hyperparathyroidism of renal origin: Secondary | ICD-10-CM | POA: Diagnosis not present

## 2019-04-03 DIAGNOSIS — D509 Iron deficiency anemia, unspecified: Secondary | ICD-10-CM | POA: Diagnosis not present

## 2019-04-03 DIAGNOSIS — D631 Anemia in chronic kidney disease: Secondary | ICD-10-CM | POA: Diagnosis not present

## 2019-04-03 DIAGNOSIS — N186 End stage renal disease: Secondary | ICD-10-CM | POA: Diagnosis not present

## 2019-04-05 DIAGNOSIS — N2581 Secondary hyperparathyroidism of renal origin: Secondary | ICD-10-CM | POA: Diagnosis not present

## 2019-04-05 DIAGNOSIS — N186 End stage renal disease: Secondary | ICD-10-CM | POA: Diagnosis not present

## 2019-04-05 DIAGNOSIS — Z992 Dependence on renal dialysis: Secondary | ICD-10-CM | POA: Diagnosis not present

## 2019-04-05 DIAGNOSIS — D631 Anemia in chronic kidney disease: Secondary | ICD-10-CM | POA: Diagnosis not present

## 2019-04-05 DIAGNOSIS — D509 Iron deficiency anemia, unspecified: Secondary | ICD-10-CM | POA: Diagnosis not present

## 2019-04-07 DIAGNOSIS — D631 Anemia in chronic kidney disease: Secondary | ICD-10-CM | POA: Diagnosis not present

## 2019-04-07 DIAGNOSIS — Z992 Dependence on renal dialysis: Secondary | ICD-10-CM | POA: Diagnosis not present

## 2019-04-07 DIAGNOSIS — N186 End stage renal disease: Secondary | ICD-10-CM | POA: Diagnosis not present

## 2019-04-07 DIAGNOSIS — N2581 Secondary hyperparathyroidism of renal origin: Secondary | ICD-10-CM | POA: Diagnosis not present

## 2019-04-07 DIAGNOSIS — D509 Iron deficiency anemia, unspecified: Secondary | ICD-10-CM | POA: Diagnosis not present

## 2019-04-10 DIAGNOSIS — D631 Anemia in chronic kidney disease: Secondary | ICD-10-CM | POA: Diagnosis not present

## 2019-04-10 DIAGNOSIS — N186 End stage renal disease: Secondary | ICD-10-CM | POA: Diagnosis not present

## 2019-04-10 DIAGNOSIS — D509 Iron deficiency anemia, unspecified: Secondary | ICD-10-CM | POA: Diagnosis not present

## 2019-04-10 DIAGNOSIS — N2581 Secondary hyperparathyroidism of renal origin: Secondary | ICD-10-CM | POA: Diagnosis not present

## 2019-04-10 DIAGNOSIS — Z992 Dependence on renal dialysis: Secondary | ICD-10-CM | POA: Diagnosis not present

## 2019-04-11 ENCOUNTER — Encounter: Payer: Self-pay | Admitting: Student in an Organized Health Care Education/Training Program

## 2019-04-11 ENCOUNTER — Ambulatory Visit
Payer: Medicare Other | Attending: Student in an Organized Health Care Education/Training Program | Admitting: Student in an Organized Health Care Education/Training Program

## 2019-04-11 ENCOUNTER — Other Ambulatory Visit: Payer: Self-pay

## 2019-04-11 DIAGNOSIS — E1142 Type 2 diabetes mellitus with diabetic polyneuropathy: Secondary | ICD-10-CM

## 2019-04-11 DIAGNOSIS — G894 Chronic pain syndrome: Secondary | ICD-10-CM

## 2019-04-11 DIAGNOSIS — M5412 Radiculopathy, cervical region: Secondary | ICD-10-CM | POA: Diagnosis not present

## 2019-04-11 DIAGNOSIS — M5416 Radiculopathy, lumbar region: Secondary | ICD-10-CM

## 2019-04-11 DIAGNOSIS — M47816 Spondylosis without myelopathy or radiculopathy, lumbar region: Secondary | ICD-10-CM

## 2019-04-11 DIAGNOSIS — M5030 Other cervical disc degeneration, unspecified cervical region: Secondary | ICD-10-CM

## 2019-04-11 DIAGNOSIS — M7918 Myalgia, other site: Secondary | ICD-10-CM

## 2019-04-11 DIAGNOSIS — M542 Cervicalgia: Secondary | ICD-10-CM

## 2019-04-11 DIAGNOSIS — M47812 Spondylosis without myelopathy or radiculopathy, cervical region: Secondary | ICD-10-CM

## 2019-04-11 MED ORDER — OXYCODONE HCL 5 MG PO TABS: 5.0000 mg | ORAL_TABLET | Freq: Two times a day (BID) | ORAL | 0 refills | Status: DC | PRN

## 2019-04-11 NOTE — Progress Notes (Signed)
Pain Management Virtual Encounter Note - Virtual Visit via Telephone Telehealth (real-time audio visits between healthcare provider and patient).  Patient's Phone No. & Preferred Pharmacy:  [phone] (home); [phone] (mobile); (Preferred) [phone] [email or web address]  [place] [place] [address] [place] [address] Phone: [phone] Fax: [phone]   Pre-screening note:  Our staff contacted Ms. Joss and offered her an "in person", "face-to-face" appointment versus a telephone encounter. She indicated preferring the telephone encounter, at this time.  Reason for Virtual Visit: COVID-19*  Social distancing based on CDC and AMA recommendations.   I contacted [name] on 04/11/2019 at 12:13 PM via telephone and clearly identified myself as Gillis Santa, MD. I verified that I was speaking with the correct person using two identifiers (Name and date of birth: November 09, 1966).  Advanced Informed Consent I sought verbal advanced consent from [name] for virtual visit interactions. I informed Ms. Oyama of possible security and privacy concerns, risks, and limitations associated with providing "not-in-person" medical evaluation and management services. I also informed Ms. Hesse of the availability of "in-person" appointments. Finally, I informed her that there would be a charge for the virtual visit and that she could be  personally, fully or partially, financially responsible for it. Ms. Hampe expressed understanding and agreed to proceed.   Historic Elements   [name] is a 53 y.o. year old, female patient evaluated today after her last encounter by our practice on 02/14/2019. Ms. Groll  has a past medical history of Allergy, Anemia, Anxiety, Arthritis, Ataxia, COPD (chronic obstructive pulmonary disease) ([place]), Depression, Diabetes mellitus with complication ([place]), ESRD on hemodialysis ([place]), Fistula, Hepatitis (2003), Hiatal  hernia, Hypercholesterolemia, Hypertension, Migraine, Neuropathy, diabetic ([place]), Peripheral vascular disease ([place]), Pneumonia (2015), Psoriasis, Tobacco dependence, and Wears dentures. She also  has a past surgical history that includes Tonsillectomy; rt. tubal and ovary removed; Cholecystectomy; Cardiac catheterization (N/A, 07/25/2015); Cardiac catheterization (Left, 07/25/2015); Cardiac catheterization (Left, 10/07/2015); Cardiac catheterization (N/A, 10/07/2015); Cardiac catheterization (10/07/2015); Cardiac catheterization (N/A, 12/17/2015); Incision and drainage abscess (N/A, 07/16/2016); cyst removed  from left hand (Left, 1989); AV fistula placement (Left, 11/2014); Cardiac catheterization (N/A, 01/11/2017); Cardiac catheterization (N/A, 01/11/2017); DIALYSIS/PERMA CATHETER INSERTION (N/A, 05/20/2017); Cesarean section; Tubal ligation; Colonoscopy (N/A, 09/22/2017); Esophagogastroduodenoscopy (N/A, 09/22/2017); polypectomy (09/22/2017); A/V SHUNT INTERVENTION (N/A, 12/16/2017); and STENT PLACEMENT VASCULAR ([facility] HX) (Left, 03/2018). Ms. Montone has a current medication list which includes the following prescription(s): adalimumab, amitriptyline, atorvastatin, calcium acetate, clobetasol, clobetasol ointment, diltiazem, glucose blood, insulin aspart, insulin glargine, insulin syringe .5cc/29g, ketoconazole, levocetirizine, midodrine, oxycodone, torsemide, barrier cream, loperamide, ondansetron, oxycodone, and oxycodone. She  reports that she has been smoking cigarettes. She has a 5.00 pack-year smoking history. She has never used smokeless tobacco. She reports that she does not drink alcohol or use drugs. Ms. Sugrue is allergic to cinnamon; garlic; onion; tylenol [acetaminophen]; ciprofloxacin; and prednisone.   HPI  I last saw her on 02/14/2019. She is being evaluated for medication management.  No change in medical history. Pain at baseline.  Pharmacotherapy Assessment   03/30/2019  3   02/14/2019  Oxycodone Hcl  5 MG Tablet  60.00 30 Bi Lat   [id number]   [place] ([id number])   0  15.00 MME  Medicare   [place]     Monitoring: Pharmacotherapy: No side-effects or adverse reactions reported. [place] PMP: PDMP not reviewed this encounter.       Compliance: No problems identified or detected. Plan: Refer  to "POC".  Review of recent tests  DG C-Arm 1-60 Min-No Report Fluoroscopy was utilized by the requesting physician.  No radiographic  interpretation.    Office Visit on 12/27/2018  Component Date Value Ref Range Status  . Amphetamines, IA 12/27/2018 Negative  Cutoff:50 ng/mL Final  . Barbiturates, IA 12/27/2018 Negative  Cutoff:0.1 ug/mL Final  . Benzodiazepines, IA 12/27/2018 Negative  Cutoff:20 ng/mL Final  . Cocaine & Metabolite, IA 12/27/2018 Negative  Cutoff:25 ng/mL Final  . Phencyclidine, IA 12/27/2018 Negative  Cutoff:8 ng/mL Final  . THC(Marijuana) Metabolite, IA 12/27/2018 Negative  Cutoff:5 ng/mL Final  . Opiates, IA 12/27/2018 Negative  Cutoff:5 ng/mL Final  . Oxycodones, IA 12/27/2018 Negative  Cutoff:5 ng/mL Final  . Methadone, IA 12/27/2018 Negative  Cutoff:25 ng/mL Final  . Propoxyphene, IA 12/27/2018 Negative  Cutoff:50 ng/mL Final   Comment: This test was developed and its performance characteristics determined by [facility].  It has not been cleared or approved by the Food and Drug Administration.    Assessment  The primary encounter diagnosis was Cervical radiculopathy. Diagnoses of Chronic pain syndrome, Diabetic polyneuropathy associated with type 2 diabetes mellitus ([place]), Spondylosis of cervical region without myelopathy or radiculopathy, Cervicalgia, DDD (degenerative disc disease), cervical, Lumbar spondylosis, Lumbar radiculopathy, and Myofascial pain were also pertinent to this visit.  Plan of Care  I am having [name] start on oxyCODONE and oxyCODONE. I am also having her maintain her clobetasol ointment, calcium acetate, loperamide, glucose blood, ondansetron, midodrine,  levocetirizine, torsemide, clobetasol, Adalimumab (HUMIRA PEN [place]), atorvastatin, diltiazem, amitriptyline, ketoconazole, barrier cream, INSULIN SYRINGE .5CC/29G, insulin glargine, insulin aspart, and oxyCODONE.  Pharmacotherapy (Medications Ordered): Meds ordered this encounter  Medications  . oxyCODONE (OXY IR/ROXICODONE) 5 MG immediate release tablet    Sig: Take 1 tablet (5 mg total) by mouth 2 (two) times daily as needed for up to 30 days for severe pain. Must last 30 days.    Dispense:  60 tablet    Refill:  0    [place] STOP ACT - Not applicable. Fill one day early if pharmacy is closed on scheduled refill date.. Must last 30 days.  Mar[place] Kitchen oxyCODONE (OXY IR/ROXICODONE) 5 MG immediate release tablet    Sig: Take 1 tablet (5 mg total) by mouth 2 (two) times daily as needed for up to 30 days for severe pain. Must last 30 days.    Dispense:  60 tablet    Refill:  0    [place] STOP ACT - Not applicable. Fill one day early if pharmacy is closed on scheduled refill date.  Marland Kitchen oxyCODONE (OXY IR/ROXICODONE) 5 MG immediate release tablet    Sig: Take 1 tablet (5 mg total) by mouth 2 (two) times daily as needed for up to 30 days for severe pain. Must last 30 days.    Dispense:  60 tablet    Refill:  0     STOP ACT - Not applicable. Fill one day early if pharmacy is closed on scheduled refill date.   Orders:  No orders of the defined types were placed in this encounter.  Follow-up plan:   No follow-ups on file.   I discussed the assessment and treatment plan with the patient. The patient was provided an opportunity to ask questions and all were answered. The patient agreed with the plan and demonstrated an understanding of the instructions.  Patient advised to call back or seek an in-person evaluation if the symptoms or condition worsens.  Total duration of non-face-to-face encounter: 43minutes.  Note by: Gillis Santa, MD Date: 04/11/2019; Time: 12:13 PM  Disclaimer:  * Given the special circumstances  of the COVID-19 pandemic, the federal government has announced that the Office for Civil Rights (OCR) will exercise its enforcement discretion and will not impose penalties on physicians using telehealth in the event of noncompliance with regulatory requirements under the [place] and Accountability Act (HIPAA) in connection with the good faith provision of telehealth during the QKSKS-13 national public health emergency. ([place])

## 2019-04-12 DIAGNOSIS — N186 End stage renal disease: Secondary | ICD-10-CM | POA: Diagnosis not present

## 2019-04-12 DIAGNOSIS — D631 Anemia in chronic kidney disease: Secondary | ICD-10-CM | POA: Diagnosis not present

## 2019-04-12 DIAGNOSIS — N2581 Secondary hyperparathyroidism of renal origin: Secondary | ICD-10-CM | POA: Diagnosis not present

## 2019-04-12 DIAGNOSIS — D509 Iron deficiency anemia, unspecified: Secondary | ICD-10-CM | POA: Diagnosis not present

## 2019-04-12 DIAGNOSIS — Z992 Dependence on renal dialysis: Secondary | ICD-10-CM | POA: Diagnosis not present

## 2019-04-14 DIAGNOSIS — N2581 Secondary hyperparathyroidism of renal origin: Secondary | ICD-10-CM | POA: Diagnosis not present

## 2019-04-14 DIAGNOSIS — N186 End stage renal disease: Secondary | ICD-10-CM | POA: Diagnosis not present

## 2019-04-14 DIAGNOSIS — D631 Anemia in chronic kidney disease: Secondary | ICD-10-CM | POA: Diagnosis not present

## 2019-04-14 DIAGNOSIS — Z992 Dependence on renal dialysis: Secondary | ICD-10-CM | POA: Diagnosis not present

## 2019-04-14 DIAGNOSIS — D509 Iron deficiency anemia, unspecified: Secondary | ICD-10-CM | POA: Diagnosis not present

## 2019-04-17 DIAGNOSIS — Z992 Dependence on renal dialysis: Secondary | ICD-10-CM | POA: Diagnosis not present

## 2019-04-17 DIAGNOSIS — Z794 Long term (current) use of insulin: Secondary | ICD-10-CM | POA: Diagnosis not present

## 2019-04-17 DIAGNOSIS — D631 Anemia in chronic kidney disease: Secondary | ICD-10-CM | POA: Diagnosis not present

## 2019-04-17 DIAGNOSIS — N2581 Secondary hyperparathyroidism of renal origin: Secondary | ICD-10-CM | POA: Diagnosis not present

## 2019-04-17 DIAGNOSIS — D509 Iron deficiency anemia, unspecified: Secondary | ICD-10-CM | POA: Diagnosis not present

## 2019-04-17 DIAGNOSIS — E119 Type 2 diabetes mellitus without complications: Secondary | ICD-10-CM | POA: Diagnosis not present

## 2019-04-17 DIAGNOSIS — N186 End stage renal disease: Secondary | ICD-10-CM | POA: Diagnosis not present

## 2019-04-19 DIAGNOSIS — D631 Anemia in chronic kidney disease: Secondary | ICD-10-CM | POA: Diagnosis not present

## 2019-04-19 DIAGNOSIS — D509 Iron deficiency anemia, unspecified: Secondary | ICD-10-CM | POA: Diagnosis not present

## 2019-04-19 DIAGNOSIS — Z992 Dependence on renal dialysis: Secondary | ICD-10-CM | POA: Diagnosis not present

## 2019-04-19 DIAGNOSIS — N2581 Secondary hyperparathyroidism of renal origin: Secondary | ICD-10-CM | POA: Diagnosis not present

## 2019-04-19 DIAGNOSIS — N186 End stage renal disease: Secondary | ICD-10-CM | POA: Diagnosis not present

## 2019-04-20 DIAGNOSIS — N186 End stage renal disease: Secondary | ICD-10-CM | POA: Diagnosis not present

## 2019-04-20 DIAGNOSIS — Z992 Dependence on renal dialysis: Secondary | ICD-10-CM | POA: Diagnosis not present

## 2019-04-21 DIAGNOSIS — Z992 Dependence on renal dialysis: Secondary | ICD-10-CM | POA: Diagnosis not present

## 2019-04-21 DIAGNOSIS — D631 Anemia in chronic kidney disease: Secondary | ICD-10-CM | POA: Diagnosis not present

## 2019-04-21 DIAGNOSIS — N186 End stage renal disease: Secondary | ICD-10-CM | POA: Diagnosis not present

## 2019-04-21 DIAGNOSIS — D509 Iron deficiency anemia, unspecified: Secondary | ICD-10-CM | POA: Diagnosis not present

## 2019-04-21 DIAGNOSIS — N2581 Secondary hyperparathyroidism of renal origin: Secondary | ICD-10-CM | POA: Diagnosis not present

## 2019-04-24 DIAGNOSIS — N2581 Secondary hyperparathyroidism of renal origin: Secondary | ICD-10-CM | POA: Diagnosis not present

## 2019-04-24 DIAGNOSIS — Z992 Dependence on renal dialysis: Secondary | ICD-10-CM | POA: Diagnosis not present

## 2019-04-24 DIAGNOSIS — D631 Anemia in chronic kidney disease: Secondary | ICD-10-CM | POA: Diagnosis not present

## 2019-04-24 DIAGNOSIS — D509 Iron deficiency anemia, unspecified: Secondary | ICD-10-CM | POA: Diagnosis not present

## 2019-04-24 DIAGNOSIS — N186 End stage renal disease: Secondary | ICD-10-CM | POA: Diagnosis not present

## 2019-04-26 DIAGNOSIS — N186 End stage renal disease: Secondary | ICD-10-CM | POA: Diagnosis not present

## 2019-04-26 DIAGNOSIS — D631 Anemia in chronic kidney disease: Secondary | ICD-10-CM | POA: Diagnosis not present

## 2019-04-26 DIAGNOSIS — D509 Iron deficiency anemia, unspecified: Secondary | ICD-10-CM | POA: Diagnosis not present

## 2019-04-26 DIAGNOSIS — N2581 Secondary hyperparathyroidism of renal origin: Secondary | ICD-10-CM | POA: Diagnosis not present

## 2019-04-26 DIAGNOSIS — Z992 Dependence on renal dialysis: Secondary | ICD-10-CM | POA: Diagnosis not present

## 2019-04-28 DIAGNOSIS — Z992 Dependence on renal dialysis: Secondary | ICD-10-CM | POA: Diagnosis not present

## 2019-04-28 DIAGNOSIS — D509 Iron deficiency anemia, unspecified: Secondary | ICD-10-CM | POA: Diagnosis not present

## 2019-04-28 DIAGNOSIS — N186 End stage renal disease: Secondary | ICD-10-CM | POA: Diagnosis not present

## 2019-04-28 DIAGNOSIS — D631 Anemia in chronic kidney disease: Secondary | ICD-10-CM | POA: Diagnosis not present

## 2019-04-28 DIAGNOSIS — N2581 Secondary hyperparathyroidism of renal origin: Secondary | ICD-10-CM | POA: Diagnosis not present

## 2019-04-29 ENCOUNTER — Other Ambulatory Visit: Payer: Self-pay | Admitting: Nurse Practitioner

## 2019-04-29 ENCOUNTER — Other Ambulatory Visit: Payer: Self-pay

## 2019-04-29 ENCOUNTER — Inpatient Hospital Stay
Admission: EM | Admit: 2019-04-29 | Discharge: 2019-05-02 | DRG: 864 | Disposition: A | Discharge status: Home / Self Care | Payer: Medicare Other | Attending: Internal Medicine | Admitting: Internal Medicine

## 2019-04-29 ENCOUNTER — Encounter: Payer: Self-pay | Admitting: Emergency Medicine

## 2019-04-29 ENCOUNTER — Emergency Department: Payer: Medicare Other

## 2019-04-29 DIAGNOSIS — A419 Sepsis, unspecified organism: Secondary | ICD-10-CM

## 2019-04-29 DIAGNOSIS — R112 Nausea with vomiting, unspecified: Secondary | ICD-10-CM | POA: Diagnosis present

## 2019-04-29 DIAGNOSIS — E1122 Type 2 diabetes mellitus with diabetic chronic kidney disease: Secondary | ICD-10-CM | POA: Diagnosis present

## 2019-04-29 DIAGNOSIS — L4050 Arthropathic psoriasis, unspecified: Secondary | ICD-10-CM | POA: Diagnosis present

## 2019-04-29 DIAGNOSIS — F17210 Nicotine dependence, cigarettes, uncomplicated: Secondary | ICD-10-CM | POA: Diagnosis present

## 2019-04-29 DIAGNOSIS — I10 Essential (primary) hypertension: Secondary | ICD-10-CM | POA: Diagnosis not present

## 2019-04-29 DIAGNOSIS — E871 Hypo-osmolality and hyponatremia: Secondary | ICD-10-CM | POA: Diagnosis not present

## 2019-04-29 DIAGNOSIS — R531 Weakness: Secondary | ICD-10-CM

## 2019-04-29 DIAGNOSIS — J00 Acute nasopharyngitis [common cold]: Secondary | ICD-10-CM

## 2019-04-29 DIAGNOSIS — Z8249 Family history of ischemic heart disease and other diseases of the circulatory system: Secondary | ICD-10-CM

## 2019-04-29 DIAGNOSIS — I132 Hypertensive heart and chronic kidney disease with heart failure and with stage 5 chronic kidney disease, or end stage renal disease: Secondary | ICD-10-CM | POA: Diagnosis not present

## 2019-04-29 DIAGNOSIS — R509 Fever, unspecified: Secondary | ICD-10-CM | POA: Diagnosis not present

## 2019-04-29 DIAGNOSIS — I5032 Chronic diastolic (congestive) heart failure: Secondary | ICD-10-CM | POA: Diagnosis present

## 2019-04-29 DIAGNOSIS — R110 Nausea: Secondary | ICD-10-CM | POA: Diagnosis not present

## 2019-04-29 DIAGNOSIS — D631 Anemia in chronic kidney disease: Secondary | ICD-10-CM | POA: Diagnosis present

## 2019-04-29 DIAGNOSIS — Z992 Dependence on renal dialysis: Secondary | ICD-10-CM

## 2019-04-29 DIAGNOSIS — E1140 Type 2 diabetes mellitus with diabetic neuropathy, unspecified: Secondary | ICD-10-CM | POA: Diagnosis present

## 2019-04-29 DIAGNOSIS — M5489 Other dorsalgia: Secondary | ICD-10-CM | POA: Diagnosis not present

## 2019-04-29 DIAGNOSIS — E785 Hyperlipidemia, unspecified: Secondary | ICD-10-CM | POA: Diagnosis present

## 2019-04-29 DIAGNOSIS — N186 End stage renal disease: Secondary | ICD-10-CM | POA: Diagnosis not present

## 2019-04-29 DIAGNOSIS — R52 Pain, unspecified: Secondary | ICD-10-CM | POA: Diagnosis not present

## 2019-04-29 DIAGNOSIS — J449 Chronic obstructive pulmonary disease, unspecified: Secondary | ICD-10-CM | POA: Diagnosis present

## 2019-04-29 DIAGNOSIS — G894 Chronic pain syndrome: Secondary | ICD-10-CM | POA: Diagnosis present

## 2019-04-29 DIAGNOSIS — R42 Dizziness and giddiness: Secondary | ICD-10-CM | POA: Diagnosis not present

## 2019-04-29 DIAGNOSIS — Z20828 Contact with and (suspected) exposure to other viral communicable diseases: Secondary | ICD-10-CM | POA: Diagnosis not present

## 2019-04-29 DIAGNOSIS — N2581 Secondary hyperparathyroidism of renal origin: Secondary | ICD-10-CM | POA: Diagnosis not present

## 2019-04-29 DIAGNOSIS — Z794 Long term (current) use of insulin: Secondary | ICD-10-CM

## 2019-04-29 MED ORDER — VANCOMYCIN HCL IN DEXTROSE 1-5 GM/200ML-% IV SOLN: 1000.0000 mg | Freq: Once | INTRAVENOUS | Status: DC

## 2019-04-29 MED ORDER — VANCOMYCIN HCL 10 G IV SOLR
2000.0000 mg | Freq: Once | INTRAVENOUS | Status: AC
  Administered 2019-04-30: 2000 mg via INTRAVENOUS
  Filled 2019-04-29: qty 2000

## 2019-04-29 MED ORDER — SODIUM CHLORIDE 0.9 % IV SOLN
2.0000 g | Freq: Once | INTRAVENOUS | Status: AC
  Administered 2019-04-30: 2 g via INTRAVENOUS
  Filled 2019-04-29: qty 2

## 2019-04-29 MED ORDER — METRONIDAZOLE IN NACL 5-0.79 MG/ML-% IV SOLN
500.0000 mg | Freq: Once | INTRAVENOUS | Status: AC
  Administered 2019-04-30: 500 mg via INTRAVENOUS
  Filled 2019-04-29: qty 100

## 2019-04-29 NOTE — ED Provider Notes (Signed)
[facility] Emergency Department Provider Note  Time seen: 11:41 PM  I have reviewed the triage vital signs and the nursing notes.   HISTORY  Chief Complaint Foot Pain; Fever; Nausea; Dizziness; and Back Pain   HPI [name] is a 53 y.o. female with a past medical history of anemia, anxiety, COPD, diabetes, end-stage renal disease on hemodialysis Monday/Wednesday/Friday, hypertension, hyperlipidemia, presents to the emergency department with complaints of fever cough, shortness of breath, generalized weakness, generalized body aches back pain and chest pains.  According to the patient over the past 1 week she has had intermittent fever at home as well as cough congestion.  Patient states intermittent nausea vomiting and diarrhea.  No appetite over the past week.  Patient had dialysis on Friday.  States mild upper abdominal/lower chest discomfort but states this is somewhat typical after dialysis.  No known sick contacts but the patient does go to a dialysis center.  Past Medical History:  Diagnosis Date  . Allergy   . Anemia   . Anxiety    worse when not at home - bowel incontinence  . Arthritis   . Ataxia   . COPD (chronic obstructive pulmonary disease) ([place])   . Depression   . Diabetes mellitus with complication ([place])   . ESRD on hemodialysis ([place])    MWF dialysis  . Fistula    left upper arm  . Hepatitis 2003   Hep C  . Hiatal hernia   . Hypercholesterolemia   . Hypertension   . Migraine   . Neuropathy, diabetic (HCC)    lower legs  . Peripheral vascular disease ([place])   . Pneumonia 2015  . Psoriasis   . Tobacco dependence   . Wears dentures    full upper, partial lower    Patient Active Problem List   Diagnosis Date Noted  . IBS (irritable bowel syndrome) 02/22/2019  . Rheumatoid arthritis involving multiple sites with positive rheumatoid factor ([place]) 05/24/2018  . Chronic hepatitis C without hepatic coma ([place]) 05/10/2018  . Seasonal  allergic rhinitis due to pollen 03/29/2018  . Complication from renal dialysis device 03/22/2018  . Chronic hepatitis C virus genotype 1a infection ([place]) 03/22/2018  . DDD (degenerative disc disease), cervical 11/23/2017  . Abdominal pain, chronic, right lower quadrant 09/14/2017  . Diarrhea 09/14/2017  . Stool incontinence 08/19/2017  . Chronic bilateral low back pain 07/20/2017  . Anxiety 07/02/2017  . Dialysis AV fistula malfunction ([place]) 05/19/2017  . Hyperkalemia 01/08/2017  . Diabetes mellitus type 2, uncontrolled, with complications ([place]) 16/09/9603  . SMA stenosis ([place]) 12/18/2016  . Difficulty walking 11/26/2016  . Neuropathy due to type 2 diabetes mellitus ([place]) 11/26/2016  . Neck pain 11/26/2016  . Kidney transplant candidate 08/11/2016  . History of MRSA infection 07/18/2016  . Acquired palmar and plantar hyperkeratosis 07/18/2016  . Essential hypertension 07/18/2016  . Tobacco abuse 07/18/2016  . ESRD (end stage renal disease) on dialysis ([place]) 07/18/2016  . Adjustment disorder with mixed disturbance of emotions and conduct 05/11/2016  . Dysthymia 05/11/2016  . Diabetic macular edema ([place]) 08/24/2013  . Hypertensive retinopathy of both eyes 08/24/2013  . Non-proliferative diabetic retinopathy, severe, both eyes ([place]) 08/24/2013  . Psoriatic arthropathy ([place]) 11/14/2012    Past Surgical History:  Procedure Laterality Date  . A/V SHUNT INTERVENTION N/A 12/16/2017   Procedure: A/V SHUNT INTERVENTION;  Surgeon: Algernon Huxley, MD;  Location: [place] [facility];  Service: Cardiovascular;  Laterality: N/A;  . AV FISTULA  PLACEMENT Left 11/2014  . CESAREAN SECTION    . CHOLECYSTECTOMY    . COLONOSCOPY N/A 09/22/2017   Procedure: COLONOSCOPY;  Surgeon: Lin Landsman, MD;  Location: [place];  Service: Endoscopy;  Laterality: N/A;  . cyst removed  from left hand Left 1989  . DIALYSIS/PERMA CATHETER INSERTION N/A 05/20/2017   Procedure: Dialysis/Perma  Catheter Insertion and fistulagram/LUE angiogram;  Surgeon: Algernon Huxley, MD;  Location: [place] [facility];  Service: Cardiovascular;  Laterality: N/A;  . ESOPHAGOGASTRODUODENOSCOPY N/A 09/22/2017   Procedure: ESOPHAGOGASTRODUODENOSCOPY (EGD);  Surgeon: Lin Landsman, MD;  Location: [place];  Service: Endoscopy;  Laterality: N/A;  . INCISION AND DRAINAGE ABSCESS N/A 07/16/2016   Procedure: INCISION AND DRAINAGE ABSCESS;  Surgeon: Carloyn Manner, MD;  Location: [facility];  Service: ENT;  Laterality: N/A;  . PERIPHERAL VASCULAR CATHETERIZATION N/A 07/25/2015   Procedure: A/V Shuntogram/Fistulagram;  Surgeon: Algernon Huxley, MD;  Location: [place] [facility];  Service: Cardiovascular;  Laterality: N/A;  . PERIPHERAL VASCULAR CATHETERIZATION Left 07/25/2015   Procedure: A/V Shunt Intervention;  Surgeon: Algernon Huxley, MD;  Location: [place] [facility];  Service: Cardiovascular;  Laterality: Left;  . PERIPHERAL VASCULAR CATHETERIZATION Left 10/07/2015   Procedure: A/V Shuntogram/Fistulagram;  Surgeon: Algernon Huxley, MD;  Location: [place] [facility];  Service: Cardiovascular;  Laterality: Left;  . PERIPHERAL VASCULAR CATHETERIZATION N/A 10/07/2015   Procedure: A/V Shunt Intervention;  Surgeon: Algernon Huxley, MD;  Location: [place] [place] [facility];  Service: Cardiovascular;  Laterality: N/A;  . PERIPHERAL VASCULAR CATHETERIZATION  10/07/2015   Procedure: Dialysis/Perma Catheter Insertion;  Surgeon: Algernon Huxley, MD;  Location: [place] [facility];  Service: Cardiovascular;;  . PERIPHERAL VASCULAR CATHETERIZATION N/A 12/17/2015   Procedure: Dialysis/Perma Catheter Removal;  Surgeon: Katha Cabal, MD;  Location: [place] [facility];  Service: Cardiovascular;  Laterality: N/A;  . PERIPHERAL VASCULAR CATHETERIZATION N/A 01/11/2017   Procedure: Visceral Angiography;  Surgeon: Algernon Huxley, MD;  Location: [place] [facility];  Service: Cardiovascular;  Laterality: N/A;  . PERIPHERAL VASCULAR  CATHETERIZATION N/A 01/11/2017   Procedure: Visceral Artery Intervention;  Surgeon: Algernon Huxley, MD;  Location: [place] [facility];  Service: Cardiovascular;  Laterality: N/A;  . POLYPECTOMY  09/22/2017   Procedure: POLYPECTOMY;  Surgeon: Lin Landsman, MD;  Location: [place];  Service: Endoscopy;;  . rt. tubal and ovary removed    . STENT PLACEMENT VASCULAR ([place] HX) Left 03/2018   Performed at [facility] using EV3 protg GPS stent graph [id number] lot [id number]  . TONSILLECTOMY    . TUBAL LIGATION      Prior to Admission medications   Medication Sig Start Date End Date Taking? Authorizing Provider  Adalimumab (HUMIRA PEN [place]) Inject into the skin every 14 (fourteen) days.    [provider]  amitriptyline (ELAVIL) 10 MG tablet Take 10 mg by mouth at bedtime.    [provider]  atorvastatin (LIPITOR) 20 MG tablet Take 1 tablet (20 mg total) by mouth daily. 06/30/18   Mikey College, NP  barrier cream (NON-SPECIFIED) CREA Apply 1 application topically 2 (two) times daily as needed (to prevent diaper rash). Patient not taking: Reported on 04/11/2019 02/22/19   Mikey College, NP  calcium acetate (PHOSLO) 667 MG capsule Take 1,334 mg by mouth 3 (three) times daily with meals.     [provider]  clobetasol (OLUX) 0.05 % topical foam Apply topically 2 (two) times  daily.    [provider]  clobetasol ointment (TEMOVATE) 9.70 % Apply 1 application topically daily as needed (for psorasis).     [provider]  diltiazem (CARDIZEM) 60 MG tablet Take 1 tablet (60 mg total) by mouth 3 (three) times daily. 01/26/19   Theora Gianotti, NP  glucose blood (TRUE METRIX BLOOD GLUCOSE TEST) test strip Use as instructed 01/11/18   Mikey College, NP  insulin aspart (NOVOLOG) 100 UNIT/ML injection Inject 1-2 Units into the skin 3 (three) times daily before meals. 03/30/19   Mikey College, NP   insulin glargine (LANTUS) 100 UNIT/ML injection Inject 0.1 mLs (10 Units total) into the skin daily. 03/30/19   Mikey College, NP  INSULIN SYRINGE .5CC/29G (TRUEPLUS INSULIN SYRINGE) 29G X 1/2" 0.5 ML MISC Use 4 times a day as directed per provider. 03/27/19   Mikey College, NP  ketoconazole (NIZORAL) 2 % cream APPLY TOPICALLY ONTO THE SKIN TWICE A DAY AS NEEDED 02/22/19   Mikey College, NP  levocetirizine (XYZAL) 5 MG tablet TAKE 1 TABLET(5 MG) BY MOUTH EVERY EVENING 04/18/18   Mikey College, NP  loperamide (IMODIUM A-D) 2 MG tablet Take 2 mg by mouth 3 (three) times daily as needed.     [provider]  midodrine (PROAMATINE) 5 MG tablet TK 1 T PO  DURING DIALYSIS TREATMENT FOR LOW BP 03/22/18   [provider]  ondansetron (ZOFRAN) 8 MG tablet Take by mouth.    [provider]  oxyCODONE (OXY IR/ROXICODONE) 5 MG immediate release tablet Take 1 tablet (5 mg total) by mouth 2 (two) times daily as needed for up to 30 days for severe pain. Must last 30 days. 04/28/19 05/28/19  Gillis Santa, MD  oxyCODONE (OXY IR/ROXICODONE) 5 MG immediate release tablet Take 1 tablet (5 mg total) by mouth 2 (two) times daily as needed for up to 30 days for severe pain. Must last 30 days. 05/28/19 06/27/19  Gillis Santa, MD  oxyCODONE (OXY IR/ROXICODONE) 5 MG immediate release tablet Take 1 tablet (5 mg total) by mouth 2 (two) times daily as needed for up to 30 days for severe pain. Must last 30 days. 06/26/19 07/26/19  Gillis Santa, MD  torsemide (DEMADEX) 100 MG tablet Take 100 mg by mouth daily.    [provider]    Allergies  Allergen Reactions  . Cinnamon Anaphylaxis  . Garlic Anaphylaxis and Hives  . Onion Hives and Swelling  . Tylenol [Acetaminophen] Anaphylaxis  . Ciprofloxacin Diarrhea  . Prednisone Other (See Comments)    Vaginal blisters & oral blisters Vaginal blisters & oral blisters Vaginal blisters & oral blisters    Family History  Problem  Relation Age of Onset  . Hypertension Mother   . Heart disease Mother   . Hypertension Father   . Skin cancer Father     Social History Social History   Tobacco Use  . Smoking status: Light Tobacco Smoker    Packs/day: 0.25    Years: 20.00    Pack years: 5.00    Types: Cigarettes    Last attempt to quit: 05/21/2017    Years since quitting: 1.9  . Smokeless tobacco: Never Used  Substance Use Topics  . Alcohol use: No  . Drug use: No    Review of Systems Constitutional: Positive for fever x1 week ENT: Negative for recent illness/congestion Cardiovascular: Mild chest pain Respiratory: Mild shortness of breath.  Positive for cough. Gastrointestinal: Mild upper abdominal  discomfort.  Positive for vomiting and diarrhea Musculoskeletal: Negative for musculoskeletal complaints Skin: Negative for skin complaints  Neurological: Negative for headache All other ROS negative  ____________________________________________   PHYSICAL EXAM:  VITAL SIGNS: ED Triage Vitals  Enc Vitals Group     BP 04/29/19 2336 (!) 191/73     Pulse Rate 04/29/19 2336 93     Resp 04/29/19 2336 20     Temp 04/29/19 2336 (!) 102.8 F (39.3 C)     Temp Source 04/29/19 2336 Oral     SpO2 04/29/19 2332 99 %     Weight 04/29/19 2339 220 lb 7.4 oz (100 kg)     Height 04/29/19 2339 5\' 7"  (1.702 m)     Head Circumference --      Peak Flow --      Pain Score 04/29/19 2337 8     Pain Loc --      Pain Edu? --      Excl. in [place]? --    Constitutional: Alert and oriented. Well appearing and in no distress. Eyes: Normal exam ENT      Head: Normocephalic and atraumatic.      Mouth/Throat: Mucous membranes are moist. Cardiovascular: Normal rate, regular rhythm around 100 bpm Respiratory: Normal respiratory effort without tachypnea nor retractions. Breath sounds are clear.  No obvious wheeze rales or rhonchi. Gastrointestinal: Soft, slight epigastric tenderness.  No rebound guarding or  distention. Musculoskeletal: Left upper extremity AV fistula. Neurologic:  Normal speech and language. No gross focal neurologic deficits Skin:  Skin is warm.  Very small area of recent bleeding to right great toe after trimming her nails.  No signs of cellulitis. Psychiatric: Mood and affect are normal.   ____________________________________________    EKG  EKG viewed and interpreted by myself shows a normal sinus rhythm at 93 bpm with a narrow QRS, left axis deviation, normal intervals.  No concerning ST changes.  ____________________________________________    RADIOLOGY  Chest x-ray negative  ____________________________________________   INITIAL IMPRESSION / ASSESSMENT AND PLAN / ED COURSE  Pertinent labs & imaging results that were available during my care of the patient were reviewed by me and considered in my medical decision making (see chart for details).   Patient presents to the emergency department for fatigue, fever, nausea, vomiting, diarrhea, cough and shortness of breath.  States symptoms have been intermittent over the past 1 week but more severe tonight.  Patient states generalized body aches aches in her hips and feet, aches in her lower back chest and upper abdomen.  Denies any pain hurting her worse than any other pain.  Patient is febrile pulse rate of 93, meeting sepsis criteria.  We will check labs, cultures, start broad-spectrum antibiotics.  We will swab for coronavirus obtain a chest x-ray and continue to closely monitor.  Patient agreeable to plan of care.  Patient's labs have begun to resolve showing a significant hyponatremia of 129, which appears to be new for the patient.  Potassium is elevated at 5.3 but the patient is on dialysis.  Troponin elevated 0.06 renal failure on dialysis.  White blood cell count of 6.2.  Patient's corona test is negative.  Patient does make a small amount of urine each day possible urosepsis.  We will admit for sepsis of  unknown origin at this time.  [name] was evaluated in Emergency Department on 04/29/2019 for the symptoms described in the history of present illness. She was evaluated in the context of the  global COVID-19 pandemic, which necessitated consideration that the patient might be at risk for infection with the SARS-CoV-2 virus that causes COVID-19. Institutional protocols and algorithms that pertain to the evaluation of patients at risk for COVID-19 are in a state of rapid change based on information released by regulatory bodies including the CDC and federal and state organizations. These policies and algorithms were followed during the patient's care in the ED.  CRITICAL CARE Performed by: Harvest Dark   Total critical care time: 30 minutes  Critical care time was exclusive of separately billable procedures and treating other patients.  Critical care was necessary to treat or prevent imminent or life-threatening deterioration.  Critical care was time spent personally by me on the following activities: development of treatment plan with patient and/or surrogate as well as nursing, discussions with consultants, evaluation of patient's response to treatment, examination of patient, obtaining history from patient or surrogate, ordering and performing treatments and interventions, ordering and review of laboratory studies, ordering and review of radiographic studies, pulse oximetry and re-evaluation of patient's condition.   ____________________________________________   FINAL CLINICAL IMPRESSION(S) / ED DIAGNOSES  Sepsis Weakness Fever Hyponatremia   Harvest Dark, MD 04/30/19 [id number]

## 2019-04-29 NOTE — ED Triage Notes (Signed)
Patient presents to Emergency Department via [facility] from home with complaints of back pain/N/V/D/fever/dizziness/poor intake for the last week  Dialysis pt M/W/F last dialysis Friday 2.2 lts removed, fistula left arm thrilling   History of DM,COPD,non-alcoholic liver cirrhosis

## 2019-04-30 DIAGNOSIS — I120 Hypertensive chronic kidney disease with stage 5 chronic kidney disease or end stage renal disease: Secondary | ICD-10-CM | POA: Diagnosis not present

## 2019-04-30 DIAGNOSIS — E1122 Type 2 diabetes mellitus with diabetic chronic kidney disease: Secondary | ICD-10-CM | POA: Diagnosis present

## 2019-04-30 DIAGNOSIS — Z8249 Family history of ischemic heart disease and other diseases of the circulatory system: Secondary | ICD-10-CM | POA: Diagnosis not present

## 2019-04-30 DIAGNOSIS — Z794 Long term (current) use of insulin: Secondary | ICD-10-CM | POA: Diagnosis not present

## 2019-04-30 DIAGNOSIS — F17210 Nicotine dependence, cigarettes, uncomplicated: Secondary | ICD-10-CM | POA: Diagnosis present

## 2019-04-30 DIAGNOSIS — I5032 Chronic diastolic (congestive) heart failure: Secondary | ICD-10-CM | POA: Diagnosis present

## 2019-04-30 DIAGNOSIS — D631 Anemia in chronic kidney disease: Secondary | ICD-10-CM | POA: Diagnosis not present

## 2019-04-30 DIAGNOSIS — E1140 Type 2 diabetes mellitus with diabetic neuropathy, unspecified: Secondary | ICD-10-CM | POA: Diagnosis present

## 2019-04-30 DIAGNOSIS — A419 Sepsis, unspecified organism: Secondary | ICD-10-CM | POA: Diagnosis not present

## 2019-04-30 DIAGNOSIS — Z992 Dependence on renal dialysis: Secondary | ICD-10-CM | POA: Diagnosis not present

## 2019-04-30 DIAGNOSIS — I132 Hypertensive heart and chronic kidney disease with heart failure and with stage 5 chronic kidney disease, or end stage renal disease: Secondary | ICD-10-CM | POA: Diagnosis present

## 2019-04-30 DIAGNOSIS — E119 Type 2 diabetes mellitus without complications: Secondary | ICD-10-CM | POA: Diagnosis not present

## 2019-04-30 DIAGNOSIS — N2581 Secondary hyperparathyroidism of renal origin: Secondary | ICD-10-CM | POA: Diagnosis not present

## 2019-04-30 DIAGNOSIS — G894 Chronic pain syndrome: Secondary | ICD-10-CM | POA: Diagnosis present

## 2019-04-30 DIAGNOSIS — I10 Essential (primary) hypertension: Secondary | ICD-10-CM | POA: Diagnosis not present

## 2019-04-30 DIAGNOSIS — R509 Fever, unspecified: Secondary | ICD-10-CM | POA: Diagnosis not present

## 2019-04-30 DIAGNOSIS — J449 Chronic obstructive pulmonary disease, unspecified: Secondary | ICD-10-CM | POA: Diagnosis present

## 2019-04-30 DIAGNOSIS — R112 Nausea with vomiting, unspecified: Secondary | ICD-10-CM | POA: Diagnosis present

## 2019-04-30 DIAGNOSIS — E785 Hyperlipidemia, unspecified: Secondary | ICD-10-CM | POA: Diagnosis present

## 2019-04-30 DIAGNOSIS — L4050 Arthropathic psoriasis, unspecified: Secondary | ICD-10-CM | POA: Diagnosis present

## 2019-04-30 DIAGNOSIS — Z20828 Contact with and (suspected) exposure to other viral communicable diseases: Secondary | ICD-10-CM | POA: Diagnosis present

## 2019-04-30 DIAGNOSIS — N186 End stage renal disease: Secondary | ICD-10-CM | POA: Diagnosis not present

## 2019-04-30 DIAGNOSIS — E871 Hypo-osmolality and hyponatremia: Secondary | ICD-10-CM | POA: Diagnosis present

## 2019-04-30 LAB — URINALYSIS, ROUTINE W REFLEX MICROSCOPIC
Bacteria, UA: NONE SEEN
Bilirubin Urine: NEGATIVE
Glucose, UA: >=500 mg/dL — AB
Ketones, ur: NEGATIVE mg/dL
Leukocytes,Ua: NEGATIVE
Nitrite: NEGATIVE
Protein, ur: 100 mg/dL — AB
Specific Gravity, Urine: 1.007 (ref 1.005–1.030)
pH: 9 — ABNORMAL HIGH (ref 5.0–8.0)

## 2019-04-30 LAB — COMPREHENSIVE METABOLIC PANEL
ALT: 33 U/L (ref 0–44)
AST: 40 U/L (ref 15–41)
Albumin: 3.3 g/dL — ABNORMAL LOW (ref 3.5–5.0)
Alkaline Phosphatase: 82 U/L (ref 38–126)
Anion gap: 13 (ref 5–15)
BUN: 34 mg/dL — ABNORMAL HIGH (ref 6–20)
CO2: 22 mmol/L (ref 22–32)
Calcium: 7.8 mg/dL — ABNORMAL LOW (ref 8.9–10.3)
Chloride: 94 mmol/L — ABNORMAL LOW (ref 98–111)
Creatinine, Ser: 5.79 mg/dL — ABNORMAL HIGH (ref 0.44–1.00)
GFR calc Af Amer: 9 mL/min — ABNORMAL LOW (ref >60)
GFR calc non Af Amer: 8 mL/min — ABNORMAL LOW (ref >60)
Glucose, Bld: 123 mg/dL — ABNORMAL HIGH (ref 70–99)
Potassium: 5.3 mmol/L — ABNORMAL HIGH (ref 3.5–5.1)
Sodium: 129 mmol/L — ABNORMAL LOW (ref 135–145)
Total Bilirubin: 0.7 mg/dL (ref 0.3–1.2)
Total Protein: 7.9 g/dL (ref 6.5–8.1)

## 2019-04-30 LAB — CBC WITH DIFFERENTIAL/PLATELET
Abs Immature Granulocytes: 0.03 10*3/uL (ref 0.00–0.07)
Basophils Absolute: 0 10*3/uL (ref 0.0–0.1)
Basophils Relative: 1 %
Eosinophils Absolute: 0 10*3/uL (ref 0.0–0.5)
Eosinophils Relative: 1 %
HCT: 30.8 % — ABNORMAL LOW (ref 36.0–46.0)
Hemoglobin: 10.4 g/dL — ABNORMAL LOW (ref 12.0–15.0)
Immature Granulocytes: 1 %
Lymphocytes Relative: 37 %
Lymphs Abs: 2.3 10*3/uL (ref 0.7–4.0)
MCH: 31.3 pg (ref 26.0–34.0)
MCHC: 33.8 g/dL (ref 30.0–36.0)
MCV: 92.8 fL (ref 80.0–100.0)
Monocytes Absolute: 0.4 10*3/uL (ref 0.1–1.0)
Monocytes Relative: 7 %
Neutro Abs: 3.4 10*3/uL (ref 1.7–7.7)
Neutrophils Relative %: 53 %
Platelets: 95 10*3/uL — ABNORMAL LOW (ref 150–400)
RBC: 3.32 MIL/uL — ABNORMAL LOW (ref 3.87–5.11)
RDW: 14.1 % (ref 11.5–15.5)
WBC: 6.2 10*3/uL (ref 4.0–10.5)
nRBC: 0 % (ref 0.0–0.2)

## 2019-04-30 LAB — RESPIRATORY PANEL BY PCR

## 2019-04-30 LAB — BASIC METABOLIC PANEL
Anion gap: 12 (ref 5–15)
BUN: 39 mg/dL — ABNORMAL HIGH (ref 6–20)
CO2: 23 mmol/L (ref 22–32)
Calcium: 7.8 mg/dL — ABNORMAL LOW (ref 8.9–10.3)
Chloride: 96 mmol/L — ABNORMAL LOW (ref 98–111)
Creatinine, Ser: 6.31 mg/dL — ABNORMAL HIGH (ref 0.44–1.00)
GFR calc Af Amer: 8 mL/min — ABNORMAL LOW (ref >60)
GFR calc non Af Amer: 7 mL/min — ABNORMAL LOW (ref >60)
Glucose, Bld: 108 mg/dL — ABNORMAL HIGH (ref 70–99)
Potassium: 4.8 mmol/L (ref 3.5–5.1)
Sodium: 131 mmol/L — ABNORMAL LOW (ref 135–145)

## 2019-04-30 LAB — BLOOD CULTURE ID PANEL (REFLEXED)

## 2019-04-30 LAB — LACTIC ACID, PLASMA
Lactic Acid, Venous: 1.3 mmol/L (ref 0.5–1.9)
Lactic Acid, Venous: 0.9 mmol/L (ref 0.5–1.9)
Lactic Acid, Venous: 1.6 mmol/L (ref 0.5–1.9)

## 2019-04-30 LAB — CBC
HCT: 28.5 % — ABNORMAL LOW (ref 36.0–46.0)
Hemoglobin: 9.6 g/dL — ABNORMAL LOW (ref 12.0–15.0)
MCH: 31.1 pg (ref 26.0–34.0)
MCHC: 33.7 g/dL (ref 30.0–36.0)
MCV: 92.2 fL (ref 80.0–100.0)
Platelets: 89 10*3/uL — ABNORMAL LOW (ref 150–400)
RBC: 3.09 MIL/uL — ABNORMAL LOW (ref 3.87–5.11)
RDW: 14.1 % (ref 11.5–15.5)
WBC: 5.9 10*3/uL (ref 4.0–10.5)
nRBC: 0 % (ref 0.0–0.2)

## 2019-04-30 LAB — LIPASE, BLOOD: Lipase: 21 U/L (ref 11–51)

## 2019-04-30 LAB — PROTIME-INR
INR: 1.2 (ref 0.8–1.2)
Prothrombin Time: 15.3 seconds — ABNORMAL HIGH (ref 11.4–15.2)

## 2019-04-30 LAB — TROPONIN I: Troponin I: 0.06 ng/mL (ref <0.03)

## 2019-04-30 LAB — CORTISOL-AM, BLOOD: Cortisol - AM: 14.6 ug/dL (ref 6.7–22.6)

## 2019-04-30 LAB — GLUCOSE, CAPILLARY
Glucose-Capillary: 133 mg/dL — ABNORMAL HIGH (ref 70–99)
Glucose-Capillary: 228 mg/dL — ABNORMAL HIGH (ref 70–99)
Glucose-Capillary: 127 mg/dL — ABNORMAL HIGH (ref 70–99)
Glucose-Capillary: 179 mg/dL — ABNORMAL HIGH (ref 70–99)
Glucose-Capillary: 156 mg/dL — ABNORMAL HIGH (ref 70–99)

## 2019-04-30 LAB — SARS CORONAVIRUS 2 BY RT PCR (HOSPITAL ORDER, PERFORMED IN ~~LOC~~ HOSPITAL LAB): SARS Coronavirus 2: NEGATIVE

## 2019-04-30 LAB — PROCALCITONIN: Procalcitonin: 1.13 ng/mL

## 2019-04-30 LAB — MRSA PCR SCREENING: MRSA by PCR: NEGATIVE

## 2019-04-30 MED ORDER — AMITRIPTYLINE HCL 10 MG PO TABS
10.0000 mg | ORAL_TABLET | Freq: Every day | ORAL | Status: DC
  Administered 2019-04-30 – 2019-05-01 (×2): 10 mg via ORAL
  Filled 2019-04-30 (×3): qty 1

## 2019-04-30 MED ORDER — CALCIUM ACETATE (PHOS BINDER) 667 MG PO CAPS
1334.0000 mg | ORAL_CAPSULE | Freq: Three times a day (TID) | ORAL | Status: DC
  Administered 2019-04-30 – 2019-05-02 (×7): 1334 mg via ORAL
  Filled 2019-04-30 (×9): qty 2

## 2019-04-30 MED ORDER — ACETAMINOPHEN 325 MG PO TABS: 650.0000 mg | ORAL_TABLET | Freq: Four times a day (QID) | ORAL | Status: DC | PRN

## 2019-04-30 MED ORDER — VANCOMYCIN HCL IN DEXTROSE 1-5 GM/200ML-% IV SOLN
1000.0000 mg | INTRAVENOUS | Status: DC
  Filled 2019-04-30: qty 200

## 2019-04-30 MED ORDER — METRONIDAZOLE IN NACL 5-0.79 MG/ML-% IV SOLN
500.0000 mg | Freq: Three times a day (TID) | INTRAVENOUS | Status: DC
  Administered 2019-04-30: 500 mg via INTRAVENOUS
  Filled 2019-04-30 (×3): qty 100

## 2019-04-30 MED ORDER — TRAZODONE HCL 50 MG PO TABS
50.0000 mg | ORAL_TABLET | Freq: Every evening | ORAL | Status: DC | PRN
  Administered 2019-05-01: 50 mg via ORAL
  Filled 2019-04-30: qty 1

## 2019-04-30 MED ORDER — INSULIN ASPART 100 UNIT/ML ~~LOC~~ SOLN
0.0000 [IU] | Freq: Three times a day (TID) | SUBCUTANEOUS | Status: DC
  Administered 2019-04-30: 17:00:00 3 [IU] via SUBCUTANEOUS
  Administered 2019-04-30: 5 [IU] via SUBCUTANEOUS
  Administered 2019-04-30 – 2019-05-01 (×2): 2 [IU] via SUBCUTANEOUS
  Administered 2019-05-01 (×2): 3 [IU] via SUBCUTANEOUS
  Administered 2019-05-02: 08:00:00 5 [IU] via SUBCUTANEOUS
  Filled 2019-04-30 (×7): qty 1

## 2019-04-30 MED ORDER — SODIUM CHLORIDE 0.9 % IV SOLN
1.0000 g | INTRAVENOUS | Status: DC
  Filled 2019-04-30: qty 1

## 2019-04-30 MED ORDER — BARRIER CREAM NON-SPECIFIED: 1.0000 "application " | TOPICAL_CREAM | Freq: Two times a day (BID) | TOPICAL | Status: DC | PRN

## 2019-04-30 MED ORDER — SODIUM CHLORIDE 0.9 % IV SOLN: 2.0000 g | INTRAVENOUS | Status: DC

## 2019-04-30 MED ORDER — ACETAMINOPHEN 650 MG RE SUPP: 650.0000 mg | Freq: Four times a day (QID) | RECTAL | Status: DC | PRN

## 2019-04-30 MED ORDER — SODIUM CHLORIDE 0.9 % IV SOLN
250.0000 mL | INTRAVENOUS | Status: DC | PRN
  Administered 2019-04-30: 250 mL via INTRAVENOUS

## 2019-04-30 MED ORDER — DILTIAZEM HCL 30 MG PO TABS
60.0000 mg | ORAL_TABLET | Freq: Three times a day (TID) | ORAL | Status: DC
  Administered 2019-04-30 – 2019-05-02 (×6): 60 mg via ORAL
  Filled 2019-04-30: qty 1
  Filled 2019-04-30 (×3): qty 2
  Filled 2019-04-30: qty 1
  Filled 2019-04-30: qty 2
  Filled 2019-04-30 (×2): qty 1
  Filled 2019-04-30 (×2): qty 2
  Filled 2019-04-30 (×2): qty 1

## 2019-04-30 MED ORDER — SODIUM CHLORIDE 0.9 % IV SOLN
3.0000 g | INTRAVENOUS | Status: DC
  Administered 2019-04-30 – 2019-05-01 (×2): 3 g via INTRAVENOUS
  Filled 2019-04-30 (×3): qty 3

## 2019-04-30 MED ORDER — SODIUM CHLORIDE 0.9% FLUSH
3.0000 mL | Freq: Two times a day (BID) | INTRAVENOUS | Status: DC
  Administered 2019-04-30 – 2019-05-01 (×4): 3 mL via INTRAVENOUS

## 2019-04-30 MED ORDER — POLYETHYLENE GLYCOL 3350 17 G PO PACK: 17.0000 g | PACK | Freq: Every day | ORAL | Status: DC | PRN

## 2019-04-30 MED ORDER — ATORVASTATIN CALCIUM 20 MG PO TABS
20.0000 mg | ORAL_TABLET | Freq: Every day | ORAL | Status: DC
  Administered 2019-04-30 – 2019-05-01 (×2): 20 mg via ORAL
  Filled 2019-04-30 (×2): qty 1

## 2019-04-30 MED ORDER — SODIUM CHLORIDE 0.9% FLUSH: 3.0000 mL | INTRAVENOUS | Status: DC | PRN

## 2019-04-30 MED ORDER — IBUPROFEN 400 MG PO TABS
800.0000 mg | ORAL_TABLET | Freq: Four times a day (QID) | ORAL | Status: DC | PRN
  Administered 2019-04-30 – 2019-05-01 (×3): 800 mg via ORAL
  Filled 2019-04-30 (×3): qty 2

## 2019-04-30 MED ORDER — PANTOPRAZOLE SODIUM 40 MG PO TBEC
40.0000 mg | DELAYED_RELEASE_TABLET | Freq: Every day | ORAL | Status: DC
  Administered 2019-04-30 – 2019-05-02 (×3): 40 mg via ORAL
  Filled 2019-04-30 (×3): qty 1

## 2019-04-30 MED ORDER — ONDANSETRON HCL 4 MG/2ML IJ SOLN
4.0000 mg | Freq: Four times a day (QID) | INTRAMUSCULAR | Status: DC | PRN
  Administered 2019-04-30: 08:00:00 4 mg via INTRAVENOUS
  Filled 2019-04-30: qty 2

## 2019-04-30 MED ORDER — HEPARIN SODIUM (PORCINE) 5000 UNIT/ML IJ SOLN
5000.0000 [IU] | Freq: Three times a day (TID) | INTRAMUSCULAR | Status: DC
  Filled 2019-04-30 (×3): qty 1

## 2019-04-30 MED ORDER — VANCOMYCIN HCL IN DEXTROSE 1-5 GM/200ML-% IV SOLN: 1000.0000 mg | Freq: Once | INTRAVENOUS | Status: DC

## 2019-04-30 MED ORDER — OXYCODONE HCL 5 MG PO TABS
5.0000 mg | ORAL_TABLET | Freq: Two times a day (BID) | ORAL | Status: DC | PRN
  Administered 2019-04-30 (×2): 5 mg via ORAL
  Filled 2019-04-30 (×2): qty 1

## 2019-04-30 MED ORDER — EPOETIN ALFA 10000 UNIT/ML IJ SOLN
10000.0000 [IU] | INTRAMUSCULAR | Status: DC
  Administered 2019-05-01: 10000 [IU] via INTRAVENOUS
  Filled 2019-04-30: qty 1

## 2019-04-30 MED ORDER — ONDANSETRON HCL 4 MG PO TABS: 4.0000 mg | ORAL_TABLET | Freq: Four times a day (QID) | ORAL | Status: DC | PRN

## 2019-04-30 MED ORDER — BISACODYL 10 MG RE SUPP: 10.0000 mg | Freq: Every day | RECTAL | Status: DC | PRN

## 2019-04-30 MED ORDER — INSULIN GLARGINE 100 UNIT/ML ~~LOC~~ SOLN
10.0000 [IU] | Freq: Every day | SUBCUTANEOUS | Status: DC
  Administered 2019-04-30 – 2019-05-02 (×3): 10 [IU] via SUBCUTANEOUS
  Filled 2019-04-30 (×3): qty 0.1

## 2019-04-30 MED ORDER — IPRATROPIUM-ALBUTEROL 0.5-2.5 (3) MG/3ML IN SOLN: 3.0000 mL | Freq: Four times a day (QID) | RESPIRATORY_TRACT | Status: DC | PRN

## 2019-04-30 MED ORDER — METOPROLOL TARTRATE 5 MG/5ML IV SOLN: 5.0000 mg | INTRAVENOUS | Status: DC | PRN

## 2019-04-30 MED ORDER — TORSEMIDE 20 MG PO TABS
100.0000 mg | ORAL_TABLET | Freq: Every day | ORAL | Status: DC
  Administered 2019-04-30 – 2019-05-02 (×3): 100 mg via ORAL
  Filled 2019-04-30 (×2): qty 1
  Filled 2019-04-30: qty 5

## 2019-04-30 MED ORDER — IBUPROFEN 400 MG PO TABS
600.0000 mg | ORAL_TABLET | Freq: Once | ORAL | Status: AC
  Administered 2019-04-30: 600 mg via ORAL
  Filled 2019-04-30: qty 2

## 2019-04-30 MED ORDER — INSULIN ASPART 100 UNIT/ML ~~LOC~~ SOLN: 0.0000 [IU] | Freq: Every day | SUBCUTANEOUS | Status: DC

## 2019-04-30 NOTE — Evaluation (Signed)
Physical Therapy Evaluation Patient Details Name: [name] MRN: [id number] DOB: [birth year] Today's Date: 04/30/2019   History of Present Illness  53 y.o. female with a past medical history of anemia, anxiety, COPD, diabetes, end-stage renal disease on hemodialysis Monday/Wednesday/Friday, hypertension, hyperlipidemia, presented to ED with complaints of fever cough, shortness of breath, generalized weakness, generalized body aches back pain and chest pains; found to have sepsis. HIstory of falls.  Clinical Impression  Patient in bed on PT arrival and amenable to participate in evaluation. Patient demonstrates strength deficits as evidenced by requiring increased time and assistance to complete bed mobility/transfers with bed mobility in a flat bed requiring the most assistance (Min A) and encouragement. Patient demonstrates good safety awareness and low risk of impulsivity with movement. Patient uses [facility] for improved steadiness with gait and transfers; requires CGA/SBA for STS and gait in room. Patient's SpO2 and HR responded appropriately to activity with no significant changes in either. Patient will benefit from continued skilled therapeutic intervention to address deficits in strength, mobility, balance, and activity tolerance while admitted and upon discharge. Patient will need to demonstrate competency with step negotiation prior to safe discharge to home with HHPT.     Follow Up Recommendations Home health PT    Equipment Recommendations  None recommended by PT    Recommendations for Other Services       Precautions / Restrictions Precautions Precautions: Fall Restrictions Weight Bearing Restrictions: No      Mobility  Bed Mobility Overal bed mobility: Needs Assistance Bed Mobility: Sidelying to Sit   Sidelying to sit: Min assist;Mod assist       General bed mobility comments: Patient has increased light-headedness with bed mobility. Min VCs for hand  placement/sequencing; mod encouragement to complete.  Transfers Overall transfer level: Needs assistance Equipment used: Straight cane Transfers: Sit to/from Stand Sit to Stand: Min guard;Supervision         General transfer comment: Patient demonstrates appropriate safety awareness and technique for transfer.  Ambulation/Gait Ambulation/Gait assistance: Min guard;Supervision Gait Distance (Feet): 15 Feet Assistive device: Straight cane Gait Pattern/deviations: Shuffle Gait velocity: decreased   General Gait Details: Patient's gait suggests fatigue and increased effort: decreased DF B, shuffling, limited weight shift B.   Stairs            Wheelchair Mobility    Modified Rankin (Stroke Patients Only)       Balance Overall balance assessment: History of Falls;Needs assistance Sitting-balance support: Feet supported;Single extremity supported Sitting balance-Leahy Scale: Good Sitting balance - Comments: Patient tolerated min/mod challenge to all planes; patient has increased need for guarding upon initially assuming sitting position 2/2 to increased light-headedness.  Postural control: Posterior lean Standing balance support: During functional activity;Single extremity supported Standing balance-Leahy Scale: Good Standing balance comment: Patient tolerated weight shifting in both directions, turns, head turns, and dual task while standing with no increased unsteadiness but some c/o of light headedness.                             Pertinent Vitals/Pain Pain Assessment: Faces Faces Pain Scale: Hurts little more Pain Location: LBP Pain Intervention(s): Limited activity within patient's tolerance;Monitored during session    [place] expects to be discharged to:: Private residence Living Arrangements: Spouse/significant other Available Help at Discharge: Family Type of Home: House Home Access: Stairs to enter   Technical brewer  of Steps: 4 Home Layout: One level Home Equipment: Environmental consultant - 2  wheels;Cane - single point      Prior Function Level of Independence: Independent with assistive device(s)         Comments: Patient uses [facility] in the community or on days where she feels more fatigued.     Hand Dominance        Extremity/Trunk Assessment   Upper Extremity Assessment Upper Extremity Assessment: Overall WFL for tasks assessed    Lower Extremity Assessment Lower Extremity Assessment: Generalized weakness;Overall WFL for tasks assessed       Communication   Communication: No difficulties  Cognition Arousal/Alertness: Awake/alert Behavior During Therapy: WFL for tasks assessed/performed Overall Cognitive Status: Within Functional Limits for tasks assessed                                        General Comments      Exercises General Exercises - Lower Extremity Long Arc Quad: 10 reps Straight Leg Raises: 5 reps Toe Raises: 10 reps Heel Raises: 10 reps   Assessment/Plan    PT Assessment Patient needs continued PT services  PT Problem List Decreased strength;Decreased mobility;Decreased activity tolerance;Decreased balance       PT Treatment Interventions Functional mobility training;Balance training;Patient/family education;Gait training;Therapeutic activities;Neuromuscular re-education;Stair training;Therapeutic exercise    PT Goals (Current goals can be found in the Care Plan section)  Acute Rehab PT Goals Patient Stated Goal: go home PT Goal Formulation: With patient Time For Goal Achievement: 05/14/19 Potential to Achieve Goals: Good    Frequency Min 2X/week   Barriers to discharge        Co-evaluation               AM-PAC PT "6 Clicks" Mobility  Outcome Measure Help needed turning from your back to your side while in a flat bed without using bedrails?: A Little Help needed moving from lying on your back to sitting on the side of a flat bed without  using bedrails?: A Little Help needed moving to and from a bed to a chair (including a wheelchair)?: A Little Help needed standing up from a chair using your arms (e.g., wheelchair or bedside chair)?: A Little Help needed to walk in hospital room?: A Little Help needed climbing 3-5 steps with a railing? : A Little 6 Click Score: 18    End of Session Equipment Utilized During Treatment: Gait belt Activity Tolerance: Patient limited by fatigue;Patient tolerated treatment well Patient left: in chair;with call bell/phone within reach;with chair alarm set Nurse Communication: Mobility status PT Visit Diagnosis: Muscle weakness (generalized) (M62.81);Difficulty in walking, not elsewhere classified (R26.2);Dizziness and giddiness (R42)    Time: 9458-5929 PT Time Calculation (min) (ACUTE ONLY): 22 min   Charges:   PT Evaluation $PT Eval Moderate Complexity: 1 Mod PT Treatments $Therapeutic Exercise: 8-22 mins       Myles Gip PT, DPT [phone] 04/30/2019, 10:45 AM

## 2019-04-30 NOTE — Progress Notes (Signed)
MD aware of fever 103.1, new order placed

## 2019-04-30 NOTE — Progress Notes (Signed)
CODE SEPSIS - PHARMACY COMMUNICATION  **Broad Spectrum Antibiotics should be administered within 1 hour of Sepsis diagnosis**  Time Code Sepsis Called/Page Received: 2341  Antibiotics Ordered: vanc/cefepime/flagyl  Time of 1st antibiotic administration: 0101  Additional action taken by pharmacy:   If necessary, Name of Provider/Nurse Contacted:     Tobie Lords ,PharmD Clinical Pharmacist  04/30/2019  3:14 AM

## 2019-04-30 NOTE — ED Notes (Signed)
Delay for ABX start and EKG due to difficulty in getting cultures from IVs, slow flow

## 2019-04-30 NOTE — Progress Notes (Signed)
Dr Benjie Karvonen aware that pt temp down to only 102.8 after motrin, states that's ok, continue to monitor

## 2019-04-30 NOTE — ED Notes (Signed)
ED TO INPATIENT HANDOFF REPORT  ED Nurse Name and Phone #: Sarita Haver [phone] S Name/Age/Gender [name] 53 y.o. female Room/Bed: ED36A/ED36A  Code Status   Code Status: Full Code  Home/SNF/Other Home Patient oriented to: self, place, time and situation Is this baseline? Yes   Triage Complete: Triage complete  Chief Complaint Flu like symptoms  Triage Note Patient presents to Emergency Department via [facility] from home with complaints of back pain/N/V/D/fever/dizziness/poor intake for the last week  Dialysis pt M/W/F last dialysis Friday 2.2 lts removed, fistula left arm thrilling   History of DM,COPD,non-alcoholic liver cirrhosis       Allergies Allergies  Allergen Reactions  . Cinnamon Anaphylaxis  . Garlic Anaphylaxis and Hives  . Onion Hives and Swelling  . Tylenol [Acetaminophen] Anaphylaxis  . Ciprofloxacin Diarrhea  . Prednisone Other (See Comments)    Vaginal blisters & oral blisters Vaginal blisters & oral blisters Vaginal blisters & oral blisters    Level of Care/Admitting Diagnosis ED Disposition    ED Disposition Condition [place]: [place] [[id number]]  Level of Care: Med-Surg [16]  Covid Evaluation: N/A  Diagnosis: Sepsis [facility]) [[id number]]  Admitting Physician: Christel Mormon [[id number]]  Attending Physician: Christel Mormon [[id number]]  Estimated length of stay: past midnight tomorrow  Certification:: I certify this patient will need inpatient services for at least 2 midnights  PT Class (Do Not Modify): Inpatient [101]  PT Acc Code (Do Not Modify): Private [1]       B Medical/Surgery History Past Medical History:  Diagnosis Date  . Allergy   . Anemia   . Anxiety    worse when not at home - bowel incontinence  . Arthritis   . Ataxia   . COPD (chronic obstructive pulmonary disease) ([place])   . Depression   . Diabetes mellitus with complication ([place])   . ESRD on hemodialysis ([place])    MWF  dialysis  . Fistula    left upper arm  . Hepatitis 2003   Hep C  . Hiatal hernia   . Hypercholesterolemia   . Hypertension   . Migraine   . Neuropathy, diabetic (HCC)    lower legs  . Peripheral vascular disease ([place])   . Pneumonia 2015  . Psoriasis   . Tobacco dependence   . Wears dentures    full upper, partial lower   Past Surgical History:  Procedure Laterality Date  . A/V SHUNT INTERVENTION N/A 12/16/2017   Procedure: A/V SHUNT INTERVENTION;  Surgeon: Algernon Huxley, MD;  Location: [place] CV LAB;  Service: Cardiovascular;  Laterality: N/A;  . AV FISTULA PLACEMENT Left 11/2014  . CESAREAN SECTION    . CHOLECYSTECTOMY    . COLONOSCOPY N/A 09/22/2017   Procedure: COLONOSCOPY;  Surgeon: Lin Landsman, MD;  Location: [place];  Service: Endoscopy;  Laterality: N/A;  . cyst removed  from left hand Left 1989  . DIALYSIS/PERMA CATHETER INSERTION N/A 05/20/2017   Procedure: Dialysis/Perma Catheter Insertion and fistulagram/LUE angiogram;  Surgeon: Algernon Huxley, MD;  Location: [place] CV LAB;  Service: Cardiovascular;  Laterality: N/A;  . ESOPHAGOGASTRODUODENOSCOPY N/A 09/22/2017   Procedure: ESOPHAGOGASTRODUODENOSCOPY (EGD);  Surgeon: Lin Landsman, MD;  Location: [place];  Service: Endoscopy;  Laterality: N/A;  . INCISION AND DRAINAGE ABSCESS N/A 07/16/2016   Procedure: INCISION AND DRAINAGE ABSCESS;  Surgeon: Carloyn Manner, MD;  Location: [facility];  Service: ENT;  Laterality: N/A;  .  PERIPHERAL VASCULAR CATHETERIZATION N/A 07/25/2015   Procedure: A/V Shuntogram/Fistulagram;  Surgeon: Algernon Huxley, MD;  Location: [place] CV LAB;  Service: Cardiovascular;  Laterality: N/A;  . PERIPHERAL VASCULAR CATHETERIZATION Left 07/25/2015   Procedure: A/V Shunt Intervention;  Surgeon: Algernon Huxley, MD;  Location: [place] CV LAB;  Service: Cardiovascular;  Laterality: Left;  . PERIPHERAL VASCULAR CATHETERIZATION Left 10/07/2015   Procedure: A/V  Shuntogram/Fistulagram;  Surgeon: Algernon Huxley, MD;  Location: [place] CV LAB;  Service: Cardiovascular;  Laterality: Left;  . PERIPHERAL VASCULAR CATHETERIZATION N/A 10/07/2015   Procedure: A/V Shunt Intervention;  Surgeon: Algernon Huxley, MD;  Location: [place] [facility];  Service: Cardiovascular;  Laterality: N/A;  . PERIPHERAL VASCULAR CATHETERIZATION  10/07/2015   Procedure: Dialysis/Perma Catheter Insertion;  Surgeon: Algernon Huxley, MD;  Location: [place] CV LAB;  Service: Cardiovascular;;  . PERIPHERAL VASCULAR CATHETERIZATION N/A 12/17/2015   Procedure: Dialysis/Perma Catheter Removal;  Surgeon: Katha Cabal, MD;  Location: [place] CV LAB;  Service: Cardiovascular;  Laterality: N/A;  . PERIPHERAL VASCULAR CATHETERIZATION N/A 01/11/2017   Procedure: Visceral Angiography;  Surgeon: Algernon Huxley, MD;  Location: [place] CV LAB;  Service: Cardiovascular;  Laterality: N/A;  . PERIPHERAL VASCULAR CATHETERIZATION N/A 01/11/2017   Procedure: Visceral Artery Intervention;  Surgeon: Algernon Huxley, MD;  Location: [place] CV LAB;  Service: Cardiovascular;  Laterality: N/A;  . POLYPECTOMY  09/22/2017   Procedure: POLYPECTOMY;  Surgeon: Lin Landsman, MD;  Location: [place];  Service: Endoscopy;;  . rt. tubal and ovary removed    . STENT PLACEMENT VASCULAR ([place] HX) Left 03/2018   Performed at [facility] using EV3 protg GPS stent graph [id number] lot [id number]  . TONSILLECTOMY    . TUBAL LIGATION       A IV Location/Drains/Wounds Patient Lines/Drains/Airways Status   Active Line/Drains/Airways    Name:   Placement date:   Placement time:   Site:   Days:   Peripheral IV 04/30/19 Right Hand   04/30/19    0025    Hand   less than 1   Peripheral IV 04/30/19 Right Forearm   04/30/19    0045    Forearm   less than 1   Fistula / Graft Left Upper arm Arteriovenous fistula   -    -    Upper arm      Fistula / Graft Left Upper arm  Arteriovenous fistula   12/15/17    1800    Upper arm   501   Sheath 05/20/17 Left Venous   05/20/17    1614    Venous   710   Sheath 12/16/17 Left Venous   12/16/17    1617    Venous   500   Incision (Closed) 09/22/17 N/A Other (Comment)   09/22/17    0954     585          Intake/Output Last 24 hours  Intake/Output Summary (Last 24 hours) at 04/30/2019 0335 Last data filed at 04/30/2019 0326 Gross per 24 hour  Intake 698.46 ml  Output -  Net 698.46 ml    Labs/Imaging Results for orders placed or performed during the hospital encounter of 04/29/19 (from the past 48 hour(s))  SARS Coronavirus 2 (CEPHEID- Performed in [place] hospital lab), Hosp Order     Status: None   Collection Time: 04/30/19 12:09 AM  Result Value Ref Range   SARS Coronavirus 2 NEGATIVE NEGATIVE  Comment: (NOTE) If result is NEGATIVE SARS-CoV-2 target nucleic acids are NOT DETECTED. The SARS-CoV-2 RNA is generally detectable in upper and lower  respiratory specimens during the acute phase of infection. The lowest  concentration of SARS-CoV-2 viral copies this assay can detect is 250  copies / mL. A negative result does not preclude SARS-CoV-2 infection  and should not be used as the sole basis for treatment or other  patient management decisions.  A negative result may occur with  improper specimen collection / handling, submission of specimen other  than nasopharyngeal swab, presence of viral mutation(s) within the  areas targeted by this assay, and inadequate number of viral copies  (<250 copies / mL). A negative result must be combined with clinical  observations, patient history, and epidemiological information. If result is POSITIVE SARS-CoV-2 target nucleic acids are DETECTED. The SARS-CoV-2 RNA is generally detectable in upper and lower  respiratory specimens dur ing the acute phase of infection.  Positive  results are indicative of active infection with SARS-CoV-2.  Clinical  correlation  with patient history and other diagnostic information is  necessary to determine patient infection status.  Positive results do  not rule out bacterial infection or co-infection with other viruses. If result is PRESUMPTIVE POSTIVE SARS-CoV-2 nucleic acids MAY BE PRESENT.   A presumptive positive result was obtained on the submitted specimen  and confirmed on repeat testing.  While 2019 novel coronavirus  (SARS-CoV-2) nucleic acids may be present in the submitted sample  additional confirmatory testing may be necessary for epidemiological  and / or clinical management purposes  to differentiate between  SARS-CoV-2 and other Sarbecovirus currently known to infect humans.  If clinically indicated additional testing with an alternate test  methodology [phone]) is advised. The SARS-CoV-2 RNA is generally  detectable in upper and lower respiratory sp ecimens during the acute  phase of infection. The expected result is Negative. Fact Sheet for Patients:  StrictlyIdeas.no Fact Sheet for Healthcare Providers: BankingDealers.co.za This test is not yet approved or cleared by the [place] FDA and has been authorized for detection and/or diagnosis of SARS-CoV-2 by FDA under an Emergency Use Authorization (EUA).  This EUA will remain in effect (meaning this test can be used) for the duration of the COVID-19 declaration under Section 564(b)(1) of the Act, 21 U.S.C. section 360bbb-3(b)(1), unless the authorization is terminated or revoked sooner. Performed at [facility], [address] [place], [place] [address]   Lactic acid, plasma     Status: None   Collection Time: 04/30/19 12:50 AM  Result Value Ref Range   Lactic Acid, Venous 1.3 0.5 - 1.9 mmol/L    Comment: Performed at [facility], [address] [place],  [address]  Comprehensive metabolic panel     Status: Abnormal   Collection Time: 04/30/19 12:50 AM   Result Value Ref Range   Sodium 129 (L) 135 - 145 mmol/L   Potassium 5.3 (H) 3.5 - 5.1 mmol/L   Chloride 94 (L) 98 - 111 mmol/L   CO2 22 22 - 32 mmol/L   Glucose, Bld 123 (H) 70 - 99 mg/dL   BUN 34 (H) 6 - 20 mg/dL   Creatinine, Ser 5.79 (H) 0.44 - 1.00 mg/dL   Calcium 7.8 (L) 8.9 - 10.3 mg/dL   Total Protein 7.9 6.5 - 8.1 g/dL   Albumin 3.3 (L) 3.5 - 5.0 g/dL   AST 40 15 - 41 U/L   ALT 33 0 - 44 U/L  Alkaline Phosphatase 82 38 - 126 U/L   Total Bilirubin 0.7 0.3 - 1.2 mg/dL   GFR calc non Af Amer 8 (L) >60 mL/min   GFR calc Af Amer 9 (L) >60 mL/min   Anion gap 13 5 - 15    Comment: Performed at [facility], [address] [place], [place] [address]  CBC WITH DIFFERENTIAL     Status: Abnormal   Collection Time: 04/30/19 12:50 AM  Result Value Ref Range   WBC 6.2 4.0 - 10.5 K/uL   RBC 3.32 (L) 3.87 - 5.11 MIL/uL   Hemoglobin 10.4 (L) 12.0 - 15.0 g/dL   HCT 30.8 (L) 36.0 - 46.0 %   MCV 92.8 80.0 - 100.0 fL   MCH 31.3 26.0 - 34.0 pg   MCHC 33.8 30.0 - 36.0 g/dL   RDW 14.1 11.5 - 15.5 %   Platelets 95 (L) 150 - 400 K/uL    Comment: PLATELET COUNT CONFIRMED BY SMEAR Immature Platelet Fraction may be clinically indicated, consider ordering this additional test [id number]    nRBC 0.0 0.0 - 0.2 %   Neutrophils Relative % 53 %   Neutro Abs 3.4 1.7 - 7.7 K/uL   Lymphocytes Relative 37 %   Lymphs Abs 2.3 0.7 - 4.0 K/uL   Monocytes Relative 7 %   Monocytes Absolute 0.4 0.1 - 1.0 K/uL   Eosinophils Relative 1 %   Eosinophils Absolute 0.0 0.0 - 0.5 K/uL   Basophils Relative 1 %   Basophils Absolute 0.0 0.0 - 0.1 K/uL   Immature Granulocytes 1 %   Abs Immature Granulocytes 0.03 0.00 - 0.07 K/uL    Comment: Performed at [facility], [address] [place], [place] [address]  Troponin I - ONCE - STAT     Status: Abnormal   Collection Time: 04/30/19 12:50 AM  Result Value Ref Range   Troponin I 0.06 (HH) <0.03 ng/mL    Comment: CRITICAL RESULT CALLED TO,  READ BACK BY AND VERIFIED WITH Nakeyia Menden WEBSTER ON 04/30/19 AT 0136 [facility] Performed at [facility], [address] [place], [place] [address]   Lipase, blood     Status: None   Collection Time: 04/30/19 12:50 AM  Result Value Ref Range   Lipase 21 11 - 51 U/L    Comment: Performed at [facility], [address] [place],  [address]   Dg Chest Port 1 View  Result Date: 04/30/2019 CLINICAL DATA:  53 year old female with fever EXAM: PORTABLE CHEST 1 VIEW COMPARISON:  Chest radiograph dated 10/13/2017 FINDINGS: There is mild cardiomegaly. No focal consolidation, pleural effusion, or pneumothorax. Left subclavian vascular stent. No acute osseous pathology. IMPRESSION: No active disease. Electronically Signed   By: Anner Crete M.D.   On: 04/30/2019 00:24    Pending Labs Unresulted Labs (From admission, onward)    Start     Ordered   05/05/19 0500  Vancomycin, random  Once-Timed,   STAT     04/30/19 0324   04/30/19 0500  Protime-INR  Tomorrow morning,   STAT     04/30/19 0309   04/30/19 0500  Cortisol-am, blood  Tomorrow morning,   STAT     04/30/19 0309   04/30/19 0500  Procalcitonin  Tomorrow morning,   STAT     04/30/19 0309   04/30/19 2376  Basic metabolic panel  Tomorrow morning,   STAT     04/30/19 0309   04/30/19 0500  CBC  Tomorrow morning,  STAT     04/30/19 0309   04/30/19 0308  Lactic acid, plasma  STAT Now then every 3 hours,   STAT     04/30/19 0309   04/30/19 0305  CBC  (heparin)  Once,   STAT    Comments:  Baseline for heparin therapy IF NOT ALREADY DRAWN.  Notify MD if PLT < 100 K.    04/30/19 0309   04/30/19 0305  Creatinine, serum  (heparin)  Once,   STAT    Comments:  Baseline for heparin therapy IF NOT ALREADY DRAWN.    04/30/19 0309   04/30/19 0304  HIV antibody (Routine Testing)  Once,   STAT     04/30/19 0309   04/29/19 2341  Blood Culture (routine x 2)  BLOOD CULTURE X 2,   STAT    Question:  Patient immune status  Answer:   Normal   04/29/19 2341   04/29/19 2341  Urinalysis, Routine w reflex microscopic  ONCE - STAT,   STAT     04/29/19 2341          Vitals/Pain Today's Vitals   04/29/19 2336 04/29/19 2337 04/29/19 2339 04/30/19 0143  BP: (!) 191/73   (!) 199/80  Pulse: 93   89  Resp: 20   18  Temp: (!) 102.8 F (39.3 C)     TempSrc: Oral     SpO2: 99%   99%  Weight:   100 kg   Height:   5\' 7"  (1.702 m)   PainSc:  8   8     Isolation Precautions No active isolations  Medications Medications  atorvastatin (LIPITOR) tablet 20 mg (has no administration in time range)  barrier cream (non-specified) 1 application (has no administration in time range)  diltiazem (CARDIZEM) tablet 60 mg (has no administration in time range)  insulin glargine (LANTUS) injection 10 Units (has no administration in time range)  amitriptyline (ELAVIL) tablet 10 mg (has no administration in time range)  calcium acetate (PHOSLO) capsule 1,334 mg (has no administration in time range)  torsemide (DEMADEX) tablet 100 mg (has no administration in time range)  heparin injection 5,000 Units (has no administration in time range)  sodium chloride flush (NS) 0.9 % injection 3 mL (has no administration in time range)  sodium chloride flush (NS) 0.9 % injection 3 mL (has no administration in time range)  0.9 %  sodium chloride infusion (has no administration in time range)  metroNIDAZOLE (FLAGYL) IVPB 500 mg (has no administration in time range)  acetaminophen (TYLENOL) tablet 650 mg (has no administration in time range)    Or  acetaminophen (TYLENOL) suppository 650 mg (has no administration in time range)  traZODone (DESYREL) tablet 50 mg (has no administration in time range)  bisacodyl (DULCOLAX) suppository 10 mg (has no administration in time range)  polyethylene glycol (MIRALAX / GLYCOLAX) packet 17 g (has no administration in time range)  ondansetron (ZOFRAN) tablet 4 mg (has no administration in time range)    Or   ondansetron (ZOFRAN) injection 4 mg (has no administration in time range)  insulin aspart (novoLOG) injection 0-15 Units (has no administration in time range)  insulin aspart (novoLOG) injection 0-5 Units (has no administration in time range)  ceFEPIme (MAXIPIME) 1 g in sodium chloride 0.9 % 100 mL IVPB (has no administration in time range)  vancomycin (VANCOCIN) IVPB 1000 mg/200 mL premix (has no administration in time range)  ceFEPIme (MAXIPIME) 2 g in sodium chloride 0.9 % 100 mL  IVPB (0 g Intravenous Stopped 04/30/19 0107)  metroNIDAZOLE (FLAGYL) IVPB 500 mg ( Intravenous Stopped 04/30/19 0207)  vancomycin (VANCOCIN) 2,000 mg in sodium chloride 0.9 % 500 mL IVPB ( Intravenous Stopped 04/30/19 0306)  ibuprofen (ADVIL) tablet 600 mg (600 mg Oral Given 04/30/19 0057)    Mobility walks Low fall risk   Focused Assessments Renal Assessment Handoff:  Hemodialysis Schedule: Hemodialysis Schedule: Monday/Wednesday/Friday Last Hemodialysis date and time: Friday/ morning    Restricted appendage: left arm     R Recommendations: See Admitting Provider Note  Report given to:   Additional Notes:

## 2019-04-30 NOTE — Progress Notes (Signed)
PHARMACY - PHYSICIAN COMMUNICATION CRITICAL VALUE ALERT - BLOOD CULTURE IDENTIFICATION (BCID)  [name] is an 53 y.o. female who presented [facility]/2019 with a chief complaint of generalized weakness, N/V and fever  Assessment:  1/4 bottles positive for Gram Positive Cocci Staph Species, MAC A not detected (include suspected source if known)  Name of physician (or Provider) Contacted: Dr. Judd Gaudier  Current antibiotics: Vancomycin, Cefepime  Changes to prescribed antibiotics recommended:  Recommendations accepted by provider  D/C Vancomycin and Cefepime and initiate Unasyn 3g IV Q 24 hrs.  Results for orders placed or performed during the hospital encounter of 04/29/19  Blood Culture ID Panel (Reflexed) (Collected: 04/30/2019 12:09 AM)  Result Value Ref Range   Enterococcus species NOT DETECTED NOT DETECTED   Listeria monocytogenes NOT DETECTED NOT DETECTED   Staphylococcus species DETECTED (A) NOT DETECTED   Staphylococcus aureus (BCID) NOT DETECTED NOT DETECTED   Methicillin resistance NOT DETECTED NOT DETECTED   Streptococcus species NOT DETECTED NOT DETECTED   Streptococcus agalactiae NOT DETECTED NOT DETECTED   Streptococcus pneumoniae NOT DETECTED NOT DETECTED   Streptococcus pyogenes NOT DETECTED NOT DETECTED   Acinetobacter baumannii NOT DETECTED NOT DETECTED   Enterobacteriaceae species NOT DETECTED NOT DETECTED   Enterobacter cloacae complex NOT DETECTED NOT DETECTED   Escherichia coli NOT DETECTED NOT DETECTED   Klebsiella oxytoca NOT DETECTED NOT DETECTED   Klebsiella pneumoniae NOT DETECTED NOT DETECTED   Proteus species NOT DETECTED NOT DETECTED   Serratia marcescens NOT DETECTED NOT DETECTED   Haemophilus influenzae NOT DETECTED NOT DETECTED   Neisseria meningitidis NOT DETECTED NOT DETECTED   Pseudomonas aeruginosa NOT DETECTED NOT DETECTED   Candida albicans NOT DETECTED NOT DETECTED   Candida glabrata NOT DETECTED NOT DETECTED   Candida  krusei NOT DETECTED NOT DETECTED   Candida parapsilosis NOT DETECTED NOT DETECTED   Candida tropicalis NOT DETECTED NOT DETECTED    Pearla Dubonnet, PharmD Clinical Pharmacist 04/30/2019 8:40 PM

## 2019-04-30 NOTE — ED Notes (Signed)
Pt reports unable to urinate, does produce "a little" urine

## 2019-04-30 NOTE — ED Notes (Signed)
CRITICAL LAB: TROPONIN is 0.06, [place], Lab, Dr. Kerman Passey notified, orders received

## 2019-04-30 NOTE — Progress Notes (Signed)
Patient briefly seen this morning and discussed with admitting nurse practitioner.  We will stop Flagyl and agree with cefepime and vancomycin.  MRSA PCR has been ordered.  Continue to monitor fever curve.  Viral panel also ordered.

## 2019-04-30 NOTE — Progress Notes (Signed)
Notified Amanda Morrison, Dialysis liaison with patient pathways of admission.   

## 2019-04-30 NOTE — ED Notes (Signed)
admitting Provider leaving bedside att, In and out cath to be performed att

## 2019-04-30 NOTE — H&P (Signed)
[facility] NAME: [name]    MR#:  [id number]  DATE OF BIRTH:  1966/11/29  DATE OF ADMISSION:  04/29/2019  PRIMARY CARE PHYSICIAN: Mikey College, NP   REQUESTING/REFERRING PHYSICIAN: Harvest Dark, MD  CHIEF COMPLAINT:   Chief Complaint  Patient presents with   Foot Pain   Fever   Nausea   Dizziness   Back Pain    HISTORY OF PRESENT ILLNESS:  [name]  is a 53 y.o. female with a known history of end-stage renal disease on hemodialysis, diabetes mellitus, hypertension, COPD.  Patient dialyzes on Monday, Wednesday, Friday and is followed by Dr.Singh, nephrology.  She presented to the emergency room with complaint of a one-week history of generalized weakness and fever as well as exertional dyspnea with cough productive of gray mucus.  Patient is also noted nausea and vomiting off and on over the last week as well.  However she denies abdominal pain.  She denies diarrhea.  She denies chest pain.  She noted a temperature max of 102.9 at home.  On arrival, her temperature is 102.8.  Heart rate is 93 on arrival.  Patient denies palpitations.  She denies hematemesis, hematochezia, or melena.  She denies known sick contacts.  No known contact with persons having coronavirus.  Rapid COVID 19 testing is negative.  Her chest x-ray demonstrated no acute pulmonary disease.  Her last dialysis was on Friday.  Lactic acid on arrival was 1.3.  WBC is 6.2.  Troponin is 0.06.  Sodium is 129.  We have admitted her to the hospital service for sepsis.  She has been started on IV antibiotic therapy with cefepime, Flagyl, and vancomycin. PAST MEDICAL HISTORY:   Past Medical History:  Diagnosis Date   Allergy    Anemia    Anxiety    worse when not at home - bowel incontinence   Arthritis    Ataxia    COPD (chronic obstructive pulmonary disease) ([place])    Depression    Diabetes mellitus with complication ([place])    ESRD  on hemodialysis ([place])    MWF dialysis   Fistula    left upper arm   Hepatitis 2003   Hep C   Hiatal hernia    Hypercholesterolemia    Hypertension    Migraine    Neuropathy, diabetic ([facility])    lower legs   Peripheral vascular disease ([place])    Pneumonia 2015   Psoriasis    Tobacco dependence    Wears dentures    full upper, partial lower    PAST SURGICAL HISTORY:   Past Surgical History:  Procedure Laterality Date   A/V SHUNT INTERVENTION N/A 12/16/2017   Procedure: A/V SHUNT INTERVENTION;  Surgeon: Algernon Huxley, MD;  Location: [facility];  Service: Cardiovascular;  Laterality: N/A;   AV FISTULA PLACEMENT Left 11/2014   CESAREAN SECTION     CHOLECYSTECTOMY     COLONOSCOPY N/A 09/22/2017   Procedure: COLONOSCOPY;  Surgeon: Lin Landsman, MD;  Location: [place] [facility];  Service: Endoscopy;  Laterality: N/A;   cyst removed  from left hand Left 1989   DIALYSIS/PERMA CATHETER INSERTION N/A 05/20/2017   Procedure: Dialysis/Perma Catheter Insertion and fistulagram/LUE angiogram;  Surgeon: Algernon Huxley, MD;  Location: [facility];  Service: Cardiovascular;  Laterality: N/A;   ESOPHAGOGASTRODUODENOSCOPY N/A 09/22/2017   Procedure: ESOPHAGOGASTRODUODENOSCOPY (EGD);  Surgeon: Lin Landsman, MD;  Location: [facility];  Service: Endoscopy;  Laterality: N/A;   INCISION AND DRAINAGE ABSCESS N/A 07/16/2016   Procedure: INCISION AND DRAINAGE ABSCESS;  Surgeon: Carloyn Manner, MD;  Location: [facility];  Service: ENT;  Laterality: N/A;   PERIPHERAL VASCULAR CATHETERIZATION N/A 07/25/2015   Procedure: A/V Shuntogram/Fistulagram;  Surgeon: Algernon Huxley, MD;  Location: [facility];  Service: Cardiovascular;  Laterality: N/A;   PERIPHERAL VASCULAR CATHETERIZATION Left 07/25/2015   Procedure: A/V Shunt Intervention;  Surgeon: Algernon Huxley, MD;  Location: [facility];  Service: Cardiovascular;  Laterality: Left;   PERIPHERAL  VASCULAR CATHETERIZATION Left 10/07/2015   Procedure: A/V Shuntogram/Fistulagram;  Surgeon: Algernon Huxley, MD;  Location: [facility];  Service: Cardiovascular;  Laterality: Left;   PERIPHERAL VASCULAR CATHETERIZATION N/A 10/07/2015   Procedure: A/V Shunt Intervention;  Surgeon: Algernon Huxley, MD;  Location: [facility];  Service: Cardiovascular;  Laterality: N/A;   PERIPHERAL VASCULAR CATHETERIZATION  10/07/2015   Procedure: Dialysis/Perma Catheter Insertion;  Surgeon: Algernon Huxley, MD;  Location: [facility];  Service: Cardiovascular;;   PERIPHERAL VASCULAR CATHETERIZATION N/A 12/17/2015   Procedure: Dialysis/Perma Catheter Removal;  Surgeon: Katha Cabal, MD;  Location: [facility];  Service: Cardiovascular;  Laterality: N/A;   PERIPHERAL VASCULAR CATHETERIZATION N/A 01/11/2017   Procedure: Visceral Angiography;  Surgeon: Algernon Huxley, MD;  Location: [facility];  Service: Cardiovascular;  Laterality: N/A;   PERIPHERAL VASCULAR CATHETERIZATION N/A 01/11/2017   Procedure: Visceral Artery Intervention;  Surgeon: Algernon Huxley, MD;  Location: [place] [facility];  Service: Cardiovascular;  Laterality: N/A;   POLYPECTOMY  09/22/2017   Procedure: POLYPECTOMY;  Surgeon: Lin Landsman, MD;  Location: [place];  Service: Endoscopy;;   rt. tubal and ovary removed     STENT PLACEMENT VASCULAR ([place] [facility]) Left 03/2018   Performed at [facility] using EV3 protg GPS stent graph [id number] lot [id number]   TONSILLECTOMY     TUBAL LIGATION      SOCIAL HISTORY:   Social History   Tobacco Use   Smoking status: Light Tobacco Smoker    Packs/day: 0.25    Years: 20.00    Pack years: 5.00    Types: Cigarettes    Last attempt to quit: 05/21/2017    Years since quitting: 1.9   Smokeless tobacco: Never Used  Substance Use Topics   Alcohol use: No    FAMILY HISTORY:   Family History  Problem Relation Age of  Onset   Hypertension Mother    Heart disease Mother    Hypertension Father    Skin cancer Father     DRUG ALLERGIES:   Allergies  Allergen Reactions   Cinnamon Anaphylaxis   Garlic Anaphylaxis and Hives   Onion Hives and Swelling   Tylenol [Acetaminophen] Anaphylaxis   Ciprofloxacin Diarrhea   Prednisone Other (See Comments)    Vaginal blisters & oral blisters Vaginal blisters & oral blisters Vaginal blisters & oral blisters    REVIEW OF SYSTEMS:   Review of Systems  Constitutional: Positive for chills, fever and malaise/fatigue.  HENT: Positive for congestion. Negative for sinus pain and sore throat.   Eyes: Negative for blurred vision, double vision and pain.  Respiratory: Positive for cough, sputum production and shortness of breath. Negative for hemoptysis and wheezing.   Cardiovascular: Negative for chest pain, palpitations and leg swelling.  Gastrointestinal: Positive for nausea and vomiting. Negative for abdominal pain, blood in stool, constipation, diarrhea  and heartburn.  Genitourinary: Negative for dysuria and hematuria.  Musculoskeletal: Positive for myalgias. Negative for back pain, joint pain and neck pain.  Skin: Negative for itching and rash.  Neurological: Negative for focal weakness, loss of consciousness and headaches.  Psychiatric/Behavioral: Negative.   All other systems reviewed and are negative.   MEDICATIONS AT HOME:   Prior to Admission medications   Medication Sig Start Date End Date Taking? Authorizing Provider  Adalimumab (HUMIRA PEN [place]) Inject into the skin every 14 (fourteen) days.   Yes [provider]  amitriptyline (ELAVIL) 10 MG tablet Take 10 mg by mouth at bedtime.   Yes [provider]  atorvastatin (LIPITOR) 20 MG tablet Take 1 tablet (20 mg total) by mouth daily. 06/30/18  Yes Mikey College, NP  calcium acetate (PHOSLO) 667 MG capsule Take 1,334 mg by mouth 3 (three) times daily with meals.    Yes  [provider]  clobetasol (OLUX) 0.05 % topical foam Apply topically 2 (two) times daily.   Yes [provider]  diltiazem (CARDIZEM) 60 MG tablet Take 1 tablet (60 mg total) by mouth 3 (three) times daily. 01/26/19  Yes Theora Gianotti, NP  glucose blood (TRUE METRIX BLOOD GLUCOSE TEST) test strip Use as instructed 01/11/18  Yes Mikey College, NP  insulin aspart (NOVOLOG) 100 UNIT/ML injection Inject 1-2 Units into the skin 3 (three) times daily before meals. 03/30/19  Yes Mikey College, NP  insulin glargine (LANTUS) 100 UNIT/ML injection Inject 0.1 mLs (10 Units total) into the skin daily. 03/30/19  Yes Mikey College, NP  INSULIN SYRINGE .5CC/29G (TRUEPLUS INSULIN SYRINGE) 29G X 1/2" 0.5 ML MISC Use 4 times a day as directed per provider. 03/27/19  Yes Mikey College, NP  ketoconazole (NIZORAL) 2 % cream APPLY TOPICALLY ONTO THE SKIN TWICE A DAY AS NEEDED 02/22/19  Yes Mikey College, NP  levocetirizine (XYZAL) 5 MG tablet TAKE 1 TABLET(5 MG) BY MOUTH EVERY EVENING 04/18/18  Yes Mikey College, NP  loperamide (IMODIUM A-D) 2 MG tablet Take 2 mg by mouth 3 (three) times daily as needed.    Yes [provider]  midodrine (PROAMATINE) 5 MG tablet TK 1 T PO  DURING DIALYSIS TREATMENT FOR LOW BP 03/22/18  Yes [provider]  ondansetron (ZOFRAN) 8 MG tablet Take 8 mg by mouth every 8 (eight) hours as needed.    Yes [provider]  torsemide (DEMADEX) 100 MG tablet Take 100 mg by mouth daily.   Yes [provider]  barrier cream (NON-SPECIFIED) CREA Apply 1 application topically 2 (two) times daily as needed (to prevent diaper rash). Patient not taking: Reported on 04/11/2019 02/22/19   Mikey College, NP  clobetasol ointment (TEMOVATE) 6.96 % Apply 1 application topically daily as needed (for psorasis).     [provider]  oxyCODONE (OXY IR/ROXICODONE) 5 MG immediate release tablet Take 1  tablet (5 mg total) by mouth 2 (two) times daily as needed for up to 30 days for severe pain. Must last 30 days. 04/28/19 05/28/19  Gillis Santa, MD  oxyCODONE (OXY IR/ROXICODONE) 5 MG immediate release tablet Take 1 tablet (5 mg total) by mouth 2 (two) times daily as needed for up to 30 days for severe pain. Must last 30 days. Patient not taking: Reported on 04/30/2019 05/28/19 06/27/19  Gillis Santa, MD  oxyCODONE (OXY IR/ROXICODONE) 5 MG immediate release tablet Take 1 tablet (5 mg total) by mouth 2 (two)  times daily as needed for up to 30 days for severe pain. Must last 30 days. Patient not taking: Reported on 04/30/2019 06/26/19 07/26/19  Gillis Santa, MD      VITAL SIGNS:  Blood pressure (!) 199/80, pulse 89, temperature (!) 102.8 F (39.3 C), temperature source Oral, resp. rate 18, height 5\' 7"  (1.702 m), weight 100 kg, SpO2 99 %.  PHYSICAL EXAMINATION:  Physical Exam Constitutional:      General: She is not in acute distress.    Appearance: She is obese.  HENT:     Head: Normocephalic.     Right Ear: External ear normal.     Left Ear: External ear normal.     Nose: Nose normal. No congestion.     Mouth/Throat:     Mouth: Mucous membranes are moist.     Pharynx: Oropharynx is clear.  Eyes:     General: No scleral icterus.    Conjunctiva/sclera: Conjunctivae normal.     Pupils: Pupils are equal, round, and reactive to light.  Neck:     Musculoskeletal: Normal range of motion and neck supple. No muscular tenderness.  Cardiovascular:     Rate and Rhythm: Regular rhythm. Tachycardia present.     Pulses: Normal pulses.     Heart sounds: Normal heart sounds. No murmur. No friction rub. No gallop.   Pulmonary:     Effort: Pulmonary effort is normal. No respiratory distress.     Breath sounds: No wheezing, rhonchi or rales.  Abdominal:     General: Bowel sounds are normal. There is no distension.     Palpations: Abdomen is soft.     Tenderness: There is no abdominal tenderness. There is  no guarding.     Hernia: No hernia is present.  Musculoskeletal: Normal range of motion.        General: No swelling or tenderness.     Right lower leg: No edema.     Left lower leg: No edema.  Skin:    General: Skin is warm.     Capillary Refill: Capillary refill takes less than 2 seconds.     Findings: No erythema or rash.  Neurological:     General: No focal deficit present.     Mental Status: She is alert and oriented to person, place, and time.     Cranial Nerves: No cranial nerve deficit.  Psychiatric:        Mood and Affect: Mood normal.        Behavior: Behavior normal.       LABORATORY PANEL:   CBC Recent Labs  Lab 04/30/19 0050  WBC 6.2  HGB 10.4*  HCT 30.8*  PLT 95*   ------------------------------------------------------------------------------------------------------------------  Chemistries  Recent Labs  Lab 04/30/19 0050  NA 129*  K 5.3*  CL 94*  CO2 22  GLUCOSE 123*  BUN 34*  CREATININE 5.79*  CALCIUM 7.8*  AST 40  ALT 33  ALKPHOS 82  BILITOT 0.7   ------------------------------------------------------------------------------------------------------------------  Cardiac Enzymes Recent Labs  Lab 04/30/19 0050  TROPONINI 0.06*   ------------------------------------------------------------------------------------------------------------------  RADIOLOGY:  Dg Chest Port 1 View  Result Date: 04/30/2019 CLINICAL DATA:  53 year old female with fever EXAM: PORTABLE CHEST 1 VIEW COMPARISON:  Chest radiograph dated 10/13/2017 FINDINGS: There is mild cardiomegaly. No focal consolidation, pleural effusion, or pneumothorax. Left subclavian vascular stent. No acute osseous pathology. IMPRESSION: No active disease. Electronically Signed   By: Anner Crete M.D.   On: 04/30/2019 00:24      IMPRESSION  AND PLAN:   1.  Sepsis/unknown source - Started on broad-spectrum IV antibiotic management with cefepime, Flagyl, and vancomycin. - We will  continue to follow results of blood cultures and adjust plan of care accordingly - Urinalysis and urine culture are pending if able to be obtained as patient is an uric at times with history of end-stage renal disease on dialysis - Sputum culture  - Lactic acid is 1.3.  No fluid bolus given due to patient's end-stage renal disease with hemodialysis  2. end-stage renal disease on hemodialysis - Nephrology consulted for hemodialysis support-patient dialyzes Monday, Wednesday, Friday with Dr.Singh  3. diabetes mellitus - Lantus insulin has been resumed - Moderate sliding scale insulin  4. acute bronchitis - IV antibiotic started as listed above - O2 per nasal cannula - DuoNebs every 6 hours as needed for shortness of breath and cough - Sputum culture pending  5.  Weakness - Physical therapy consulted for supportive care  Patient has been started on DVT and PPI prophylaxis  All the records are reviewed and case discussed with ED provider. The plan of care was discussed in details with the patient (and family). I answered all questions. The patient agreed to proceed with the above mentioned plan. Further management will depend upon hospital course.   CODE STATUS: Full code  TOTAL TIME TAKING CARE OF THIS PATIENT: 45 minutes.    [place] 04/30/2019 at 3:20 AM  Pager - [phone]  After 6pm go to [email or web address] - password [email or web address] [facility]  [employer] [place] [facility]  Office  [phone]  CC: Primary care physician; Mikey College, NP   Note: This dictation was prepared with Dragon dictation along with smaller phrase technology. Any transcriptional errors that result from this process are unintentional.

## 2019-04-30 NOTE — H&P (Signed)
Attending physician admission note:  I have seen and examined the patient with Ms. Gardiner Barefoot, CRNP.  The patient presents with generalized weakness, nausea and vomiting intermittently and fever that was up to 102.9.  He was COVID-19 negative.  Portable chest x-ray came back negative.  UA is pending but the patient has end-stage renal disease on hemodialysis.  Upon physical examination:  Generally: Pleasant middle-aged Caucasian female in no acute distress Cardiovascular: Regular rate and rhythm with normal S1-S2 and no murmurs gallops or rubs. Respiratory: Clear to auscultation bilaterally Abdomen: Soft, nontender, nondistended with positive bowel sounds and no palpable megaly or masses. Extremities: No edema clubbing or cyanosis  Labs and radiographic studies: were all reviewed  Assessment/plan: The patient will be admitted to a medically monitored bed for suspected sepsis.  She will be empirically managed with IV cefepime, Flagyl and vancomycin.  Will follow blood cultures.  Will obtain a urinalysis plus/minus urine culture and sensitivity if obtainable.  Nephrology consult will be obtained for follow-up on hemodialysis with her end-stage renal disease.  For further details please refer to dictated admission H&P.  I have discussed the case with my nurse practitioner. I agree with the admission note and the rest of the plan of care as delineated by Ms. Gardiner Barefoot, CRNP.

## 2019-04-30 NOTE — Progress Notes (Signed)
Pharmacy Antibiotic Note  [name] is a 53 y.o. female admitted on 04/29/2019 with sepsis.  Pharmacy has been consulted for vanc/cefepime/flagyl dosing.  Plan: Patient received vanc 2g IV load, cefepime 2g IV x 1, and flagyl 500 mg IV x 1 in ED  Patient is a dialysis patient w/ a MWF schedule. Will dose w/ vanc 1g IV qMWF w/ HD. Will check a pre-HD level 05/15 @ 0500. Goal pre HD < 20 - 25 mcg/mL  Height: 5\' 7"  (170.2 cm) Weight: 220 lb 7.4 oz (100 kg) IBW/kg (Calculated) : 61.6  Temp (24hrs), Avg:102.8 F (39.3 C), Min:102.8 F (39.3 C), Max:102.8 F (39.3 C)  Recent Labs  Lab 04/30/19 0050  WBC 6.2  CREATININE 5.79*  LATICACIDVEN 1.3    Estimated Creatinine Clearance: 13.5 mL/min (A) (by C-G formula based on SCr of 5.79 mg/dL (H)).    Allergies  Allergen Reactions  . Cinnamon Anaphylaxis  . Garlic Anaphylaxis and Hives  . Onion Hives and Swelling  . Tylenol [Acetaminophen] Anaphylaxis  . Ciprofloxacin Diarrhea  . Prednisone Other (See Comments)    Vaginal blisters & oral blisters Vaginal blisters & oral blisters Vaginal blisters & oral blisters    Thank you for allowing pharmacy to be a part of this patient's care.  Tobie Lords, PharmD, BCPS Clinical Pharmacist 04/30/2019

## 2019-04-30 NOTE — Progress Notes (Signed)
[facility]  ROUNDING NOTE   Subjective:   [name] admitted to [facility] on 04/29/2019 for Hyponatremia [E87.1] Weakness [R53.1] Fever, unspecified fever cause [R50.9] Sepsis, due to unspecified organism, unspecified whether acute organ dysfunction present [facility]) [A41.9]  Patient states she has been having intermittent fevers/chills, nausea/vomiting, poor appetite and generalized weakness for last few days. Was not febrile at dialysis on Friday, her last hemodialysis treatment.   Objective:  Vital signs in last 24 hours:  Temp:  [98.9 F (37.2 C)-103.1 F (39.5 C)] 99.5 F (37.5 C) (05/10 1123) Pulse Rate:  [77-93] 83 (05/10 1123) Resp:  [18-20] 19 (05/10 0413) BP: (163-199)/(69-80) 163/69 (05/10 0413) SpO2:  [97 %-99 %] 97 % (05/10 0413) Weight:  [100 kg] 100 kg (05/09 2339)  Weight change:  Filed Weights   04/29/19 2339  Weight: 100 kg    Intake/Output: I/O last 3 completed shifts: In: 698.5 [IV Piggyback:698.5] Out: -    Intake/Output this shift:  No intake/output data recorded.  Physical Exam: General: NAD, seated in chair  Head: Normocephalic, atraumatic. Moist oral mucosal membranes  Eyes: Anicteric, PERRL  Neck: Supple, trachea midline  Lungs:  Clear to auscultation  Heart: Regular rate and rhythm  Abdomen:  Soft, nontender,   Extremities:  no peripheral edema.  Neurologic: Nonfocal, moving all four extremities  Skin: No lesions  Access: Left AVF    Basic Metabolic Panel: Recent Labs  Lab 04/30/19 0050 04/30/19 0453  NA 129* 131*  K 5.3* 4.8  CL 94* 96*  CO2 22 23  GLUCOSE 123* 108*  BUN 34* 39*  CREATININE 5.79* 6.31*  CALCIUM 7.8* 7.8*    Liver Function Tests: Recent Labs  Lab 04/30/19 0050  AST 40  ALT 33  ALKPHOS 82  BILITOT 0.7  PROT 7.9  ALBUMIN 3.3*   Recent Labs  Lab 04/30/19 0050  LIPASE 21   No results for input(s): AMMONIA in the last 168 hours.  CBC: Recent Labs  Lab 04/30/19 0050 04/30/19 0453   WBC 6.2 5.9  NEUTROABS 3.4  --   HGB 10.4* 9.6*  HCT 30.8* 28.5*  MCV 92.8 92.2  PLT 95* 89*    Cardiac Enzymes: Recent Labs  Lab 04/30/19 0050  TROPONINI 0.06*    BNP: Invalid input(s): POCBNP  CBG: Recent Labs  Lab 04/30/19 0515 04/30/19 0811 04/30/19 1125  GLUCAP 133* 228* 127*    Microbiology: Results for orders placed or performed during the hospital encounter of 04/29/19  Blood Culture (routine x 2)     Status: None (Preliminary result)   Collection Time: 04/30/19 12:09 AM  Result Value Ref Range Status   Specimen Description BLOOD RIGHT FOREARM  Final   Special Requests   Final    BOTTLES DRAWN AEROBIC AND ANAEROBIC Blood Culture adequate volume NORMAL   Culture   Final    NO GROWTH < 12 HOURS Performed at [facility], [address], [place], [place] [address]    Report Status PENDING  Incomplete  SARS Coronavirus 2 (CEPHEID- Performed in [place] hospital lab), Hosp Order     Status: None   Collection Time: 04/30/19 12:09 AM  Result Value Ref Range Status   SARS Coronavirus 2 NEGATIVE NEGATIVE Final    Comment: (NOTE) If result is NEGATIVE SARS-CoV-2 target nucleic acids are NOT DETECTED. The SARS-CoV-2 RNA is generally detectable in upper and lower  respiratory specimens during the acute phase of infection. The lowest  concentration of SARS-CoV-2 viral  copies this assay can detect is 250  copies / mL. A negative result does not preclude SARS-CoV-2 infection  and should not be used as the sole basis for treatment or other  patient management decisions.  A negative result may occur with  improper specimen collection / handling, submission of specimen other  than nasopharyngeal swab, presence of viral mutation(s) within the  areas targeted by this assay, and inadequate number of viral copies  (<250 copies / mL). A negative result must be combined with clinical  observations, patient history, and epidemiological information. If result is  POSITIVE SARS-CoV-2 target nucleic acids are DETECTED. The SARS-CoV-2 RNA is generally detectable in upper and lower  respiratory specimens dur ing the acute phase of infection.  Positive  results are indicative of active infection with SARS-CoV-2.  Clinical  correlation with patient history and other diagnostic information is  necessary to determine patient infection status.  Positive results do  not rule out bacterial infection or co-infection with other viruses. If result is PRESUMPTIVE POSTIVE SARS-CoV-2 nucleic acids MAY BE PRESENT.   A presumptive positive result was obtained on the submitted specimen  and confirmed on repeat testing.  While 2019 novel coronavirus  (SARS-CoV-2) nucleic acids may be present in the submitted sample  additional confirmatory testing may be necessary for epidemiological  and / or clinical management purposes  to differentiate between  SARS-CoV-2 and other Sarbecovirus currently known to infect humans.  If clinically indicated additional testing with an alternate test  methodology [phone]) is advised. The SARS-CoV-2 RNA is generally  detectable in upper and lower respiratory sp ecimens during the acute  phase of infection. The expected result is Negative. Fact Sheet for Patients:  StrictlyIdeas.no Fact Sheet for Healthcare Providers: BankingDealers.co.za This test is not yet approved or cleared by the [place] FDA and has been authorized for detection and/or diagnosis of SARS-CoV-2 by FDA under an Emergency Use Authorization (EUA).  This EUA will remain in effect (meaning this test can be used) for the duration of the COVID-19 declaration under Section 564(b)(1) of the Act, 21 U.S.C. section 360bbb-3(b)(1), unless the authorization is terminated or revoked sooner. Performed at [facility], [address] [place], [place] [address]   Blood Culture (routine x 2)     Status: None  (Preliminary result)   Collection Time: 04/30/19 12:50 AM  Result Value Ref Range Status   Specimen Description BLOOD RIGHT HAND  Final   Special Requests   Final    BOTTLES DRAWN AEROBIC AND ANAEROBIC Blood Culture adequate volume Normal   Culture   Final    NO GROWTH < 12 HOURS Performed at [facility], [address], [place], [place] [address]    Report Status PENDING  Incomplete  MRSA PCR Screening     Status: None   Collection Time: 04/30/19  8:22 AM  Result Value Ref Range Status   MRSA by PCR NEGATIVE NEGATIVE Final    Comment:        The GeneXpert MRSA Assay (FDA approved for NASAL specimens only), is one component of a comprehensive MRSA colonization surveillance program. It is not intended to diagnose MRSA infection nor to guide or monitor treatment for MRSA infections. Performed at [facility], [address], [place], [place] [address]     Coagulation Studies: Recent Labs    04/30/19 0453  LABPROT 15.3*  INR 1.2    Urinalysis: Recent Labs    04/30/19 [place]  1.007  PHURINE 9.0*  GLUCOSEU >=500*  HGBUR SMALL*  BILIRUBINUR NEGATIVE  KETONESUR NEGATIVE  PROTEINUR 100*  NITRITE NEGATIVE  LEUKOCYTESUR NEGATIVE      Imaging: Dg Chest Port 1 View  Result Date: 04/30/2019 CLINICAL DATA:  53 year old female with fever EXAM: PORTABLE CHEST 1 VIEW COMPARISON:  Chest radiograph dated 10/13/2017 FINDINGS: There is mild cardiomegaly. No focal consolidation, pleural effusion, or pneumothorax. Left subclavian vascular stent. No acute osseous pathology. IMPRESSION: No active disease. Electronically Signed   By: Anner Crete M.D.   On: 04/30/2019 00:24     Medications:   . sodium chloride    . [START ON 05/01/2019] ceFEPime (MAXIPIME) IV    . [START ON 05/01/2019] vancomycin     . amitriptyline  10 mg Oral QHS  . atorvastatin  20 mg Oral Daily  . calcium acetate  1,334 mg Oral TID WC  . diltiazem  60 mg  Oral TID  . heparin  5,000 Units Subcutaneous Q8H  . insulin aspart  0-15 Units Subcutaneous TID WC  . insulin aspart  0-5 Units Subcutaneous QHS  . insulin glargine  10 Units Subcutaneous Daily  . sodium chloride flush  3 mL Intravenous Q12H  . torsemide  100 mg Oral Daily   sodium chloride, bisacodyl, ibuprofen, ipratropium-albuterol, metoprolol tartrate, ondansetron **OR** ondansetron (ZOFRAN) IV, oxyCODONE, polyethylene glycol, sodium chloride flush, traZODone  Assessment/ Plan:  [name] is a 53 y.o. white female with end stage renal disease on hemodialysis, diabetes mellitus type II insulin dependent, hypertension, diastolic congestive heart failure, chronic pain syndrome, hepatitis C, diabetic neuropathy, psoriatic arthritis on Humira.   Admitted with fever, nausea, vomiting and weakness. Tmax of 103.1. COVID-19 negative.   [facility] M[facility] [place]. Left AVF 98.5kg.   1. End Stage Renal Disease: last hemodialysis was Friday 5/8. Next treatment for tomorrow. Orders prepared.   2. Hypertension:  - diltiazem and torsemide  3. Anemia of chronic kidney disease: hemoglobin 9.6 - EPO with HD treatment.   4. Secondary Hyperparathyroidism: PTH 103 - low. With hyperphosphatemia - Continue calcium acetate    LOS: 0 Cleland Simkins 5/10/20202:26 PM

## 2019-05-01 LAB — RENAL FUNCTION PANEL
Albumin: 3 g/dL — ABNORMAL LOW (ref 3.5–5.0)
Anion gap: 11 (ref 5–15)
BUN: 52 mg/dL — ABNORMAL HIGH (ref 6–20)
CO2: 21 mmol/L — ABNORMAL LOW (ref 22–32)
Calcium: 7.7 mg/dL — ABNORMAL LOW (ref 8.9–10.3)
Chloride: 96 mmol/L — ABNORMAL LOW (ref 98–111)
Creatinine, Ser: 7.5 mg/dL — ABNORMAL HIGH (ref 0.44–1.00)
GFR calc Af Amer: 6 mL/min — ABNORMAL LOW (ref >60)
GFR calc non Af Amer: 6 mL/min — ABNORMAL LOW (ref >60)
Glucose, Bld: 152 mg/dL — ABNORMAL HIGH (ref 70–99)
Phosphorus: 4.2 mg/dL (ref 2.5–4.6)
Potassium: 5.2 mmol/L — ABNORMAL HIGH (ref 3.5–5.1)
Sodium: 128 mmol/L — ABNORMAL LOW (ref 135–145)

## 2019-05-01 LAB — GLUCOSE, CAPILLARY
Glucose-Capillary: 154 mg/dL — ABNORMAL HIGH (ref 70–99)
Glucose-Capillary: 163 mg/dL — ABNORMAL HIGH (ref 70–99)
Glucose-Capillary: 144 mg/dL — ABNORMAL HIGH (ref 70–99)
Glucose-Capillary: 198 mg/dL — ABNORMAL HIGH (ref 70–99)
Glucose-Capillary: 178 mg/dL — ABNORMAL HIGH (ref 70–99)

## 2019-05-01 LAB — CBC
HCT: 27.8 % — ABNORMAL LOW (ref 36.0–46.0)
Hemoglobin: 9.3 g/dL — ABNORMAL LOW (ref 12.0–15.0)
MCH: 30.6 pg (ref 26.0–34.0)
MCHC: 33.5 g/dL (ref 30.0–36.0)
MCV: 91.4 fL (ref 80.0–100.0)
Platelets: 78 10*3/uL — ABNORMAL LOW (ref 150–400)
RBC: 3.04 MIL/uL — ABNORMAL LOW (ref 3.87–5.11)
RDW: 14.2 % (ref 11.5–15.5)
WBC: 6.4 10*3/uL (ref 4.0–10.5)
nRBC: 0 % (ref 0.0–0.2)

## 2019-05-01 MED ORDER — AMOXICILLIN-POT CLAVULANATE 500-125 MG PO TABS: 1.0000 | ORAL_TABLET | Freq: Every day | ORAL | 0 refills | Status: AC

## 2019-05-01 NOTE — Progress Notes (Signed)
HD Post Tx  Temp 99.3 post tx, 1.5 liters removed.     05/01/19 1226  Vital Signs  Pulse Rate (!) 101  Resp 19  BP (!) 157/59  Oxygen Therapy  SpO2 100 %  Pain Assessment  Pain Scale 0-10  Pain Score 0  Dialysis Weight  Weight 103.4 kg  Type of Weight Post-Dialysis  Post-Hemodialysis Assessment  Rinseback Volume (mL) 250 mL  Dialyzer Clearance Lightly streaked  Duration of HD Treatment -hour(s) 3 hour(s)  Hemodialysis Intake (mL) 500 mL  UF Total -Machine (mL) 2000 mL  Net UF (mL) 1500 mL  Tolerated HD Treatment Yes  AVG/AVF Arterial Site Held (minutes) 10 minutes  AVG/AVF Venous Site Held (minutes) 10 minutes  Fistula / Graft Left Upper arm Arteriovenous fistula  No Placement Date or Time found.   Orientation: Left  Access Location: Upper arm  Access Type: Arteriovenous fistula  Site Condition No complications  Fistula / Graft Assessment Present;Thrill;Bruit  Status Deaccessed  Drainage Description None

## 2019-05-01 NOTE — Progress Notes (Signed)
[facility]  ROUNDING NOTE   Subjective:   [name] admitted to [facility] on 04/29/2019 for Hyponatremia [E87.1] Weakness [R53.1] Fever, unspecified fever cause [R50.9] Sepsis, due to unspecified organism, unspecified whether acute organ dysfunction present [facility]) [A41.9]  Patient seen during HD [place] c/o poor appetite and weakness Results of resp panel are negative   HEMODIALYSIS FLOWSHEET:  Blood Flow Rate (mL/min): 200 mL/min Arterial Pressure (mmHg): -150 mmHg Venous Pressure (mmHg): 200 mmHg Transmembrane Pressure (mmHg): 50 mmHg Ultrafiltration Rate (mL/min): 670 mL/min Dialysate Flow Rate (mL/min): 600 ml/min Conductivity: Machine : 14 Conductivity: Machine : 14 Dialysis Fluid Bolus: Normal Saline Bolus Amount (mL): 250 mL    Objective:  Vital signs in last 24 hours:  Temp:  [98.4 F (36.9 C)-101.7 F (38.7 C)] 101.7 F (38.7 C) (05/11 1505) Pulse Rate:  [71-102] 101 (05/11 1230) Resp:  [14-33] 21 (05/11 1230) BP: (139-187)/(50-115) 169/89 (05/11 1230) SpO2:  [97 %-100 %] 97 % (05/11 1230) Weight:  [103.4 kg-105.3 kg] 103.4 kg (05/11 1226)  Weight change:  Filed Weights   05/01/19 0910 05/01/19 1224 05/01/19 1226  Weight: 105.3 kg 103.4 kg 103.4 kg    Intake/Output: I/O last 3 completed shifts: In: 814.4 [I.V.:16; IV Piggyback:798.5] Out: -    Intake/Output this shift:  Total I/O In: 0  Out: 1500 [Other:1500]  Physical Exam: General: NAD, laying in bed  Head: Normocephalic, atraumatic. Moist oral mucosal membranes  Eyes: Anicteric,  Neck: Supple   Lungs:  Clear to auscultation  Heart: Regular rate and rhythm  Abdomen:  Soft, nontender,   Extremities:  no peripheral edema.  Neurologic: Nonfocal, moving all four extremities  Skin: No lesions  Access: Left AVF    Basic Metabolic Panel: Recent Labs  Lab 04/30/19 0050 04/30/19 0453 05/01/19 0015  NA 129* 131* 128*  K 5.3* 4.8 5.2*  CL 94* 96* 96*  CO2 22 23 21*  GLUCOSE  123* 108* 152*  BUN 34* 39* 52*  CREATININE 5.79* 6.31* 7.50*  CALCIUM 7.8* 7.8* 7.7*  PHOS  --   --  4.2    Liver Function Tests: Recent Labs  Lab 04/30/19 0050 05/01/19 0015  AST 40  --   ALT 33  --   ALKPHOS 82  --   BILITOT 0.7  --   PROT 7.9  --   ALBUMIN 3.3* 3.0*   Recent Labs  Lab 04/30/19 0050  LIPASE 21   No results for input(s): AMMONIA in the last 168 hours.  CBC: Recent Labs  Lab 04/30/19 0050 04/30/19 0453 05/01/19 0015  WBC 6.2 5.9 6.4  NEUTROABS 3.4  --   --   HGB 10.4* 9.6* 9.3*  HCT 30.8* 28.5* 27.8*  MCV 92.8 92.2 91.4  PLT 95* 89* 78*    Cardiac Enzymes: Recent Labs  Lab 04/30/19 0050  TROPONINI 0.06*    BNP: Invalid input(s): POCBNP  CBG: Recent Labs  Lab 04/30/19 1611 04/30/19 2103 05/01/19 0737 05/01/19 1056 05/01/19 1344  GLUCAP 179* 156* 154* 163* 144*    Microbiology: Results for orders placed or performed during the hospital encounter of 04/29/19  Blood Culture (routine x 2)     Status: Abnormal (Preliminary result)   Collection Time: 04/30/19 12:09 AM  Result Value Ref Range Status   Specimen Description   Final    BLOOD RIGHT FOREARM Performed at [facility], [address] [place], [place] [address]    Special Requests   Final    BOTTLES  DRAWN AEROBIC AND ANAEROBIC Blood Culture adequate volume NORMAL Performed at [facility], [address] [place], [place] [address]    Culture  Setup Time   Final    Organism ID to follow GRAM POSITIVE COCCI AEROBIC BOTTLE ONLY CRITICAL RESULT CALLED TO, READ BACK BY AND VERIFIED WITH: MYRA SLAUGHTER AT 1800 04/30/2019.PMF GRAM STAIN REVIEWED-AGREE WITH RESULT T. TYSOR    Culture (A)  Final    STAPHYLOCOCCUS SPECIES (COAGULASE NEGATIVE) THE SIGNIFICANCE OF ISOLATING THIS ORGANISM FROM A SINGLE SET OF BLOOD CULTURES WHEN MULTIPLE SETS ARE DRAWN IS UNCERTAIN. PLEASE NOTIFY THE MICROBIOLOGY DEPARTMENT WITHIN ONE WEEK IF SPECIATION AND SENSITIVITIES  ARE REQUIRED. Performed at [facility], [place] [address], [place], [place] [address]    Report Status PENDING  Incomplete  SARS Coronavirus 2 (CEPHEID- Performed in [place] hospital lab), Hosp Order     Status: None   Collection Time: 04/30/19 12:09 AM  Result Value Ref Range Status   SARS Coronavirus 2 NEGATIVE NEGATIVE Final    Comment: (NOTE) If result is NEGATIVE SARS-CoV-2 target nucleic acids are NOT DETECTED. The SARS-CoV-2 RNA is generally detectable in upper and lower  respiratory specimens during the acute phase of infection. The lowest  concentration of SARS-CoV-2 viral copies this assay can detect is 250  copies / mL. A negative result does not preclude SARS-CoV-2 infection  and should not be used as the sole basis for treatment or other  patient management decisions.  A negative result may occur with  improper specimen collection / handling, submission of specimen other  than nasopharyngeal swab, presence of viral mutation(s) within the  areas targeted by this assay, and inadequate number of viral copies  (<250 copies / mL). A negative result must be combined with clinical  observations, patient history, and epidemiological information. If result is POSITIVE SARS-CoV-2 target nucleic acids are DETECTED. The SARS-CoV-2 RNA is generally detectable in upper and lower  respiratory specimens dur ing the acute phase of infection.  Positive  results are indicative of active infection with SARS-CoV-2.  Clinical  correlation with patient history and other diagnostic information is  necessary to determine patient infection status.  Positive results do  not rule out bacterial infection or co-infection with other viruses. If result is PRESUMPTIVE POSTIVE SARS-CoV-2 nucleic acids MAY BE PRESENT.   A presumptive positive result was obtained on the submitted specimen  and confirmed on repeat testing.  While 2019 novel coronavirus  (SARS-CoV-2) nucleic acids may be present in  the submitted sample  additional confirmatory testing may be necessary for epidemiological  and / or clinical management purposes  to differentiate between  SARS-CoV-2 and other Sarbecovirus currently known to infect humans.  If clinically indicated additional testing with an alternate test  methodology [phone]) is advised. The SARS-CoV-2 RNA is generally  detectable in upper and lower respiratory sp ecimens during the acute  phase of infection. The expected result is Negative. Fact Sheet for Patients:  StrictlyIdeas.no Fact Sheet for Healthcare Providers: BankingDealers.co.za This test is not yet approved or cleared by the [place] FDA and has been authorized for detection and/or diagnosis of SARS-CoV-2 by FDA under an Emergency Use Authorization (EUA).  This EUA will remain in effect (meaning this test can be used) for the duration of the COVID-19 declaration under Section 564(b)(1) of the Act, 21 U.S.C. section 360bbb-3(b)(1), unless the authorization is terminated or revoked sooner. Performed at [facility], [address], [place], [place] [address]  Blood Culture ID Panel (Reflexed)     Status: Abnormal   Collection Time: 04/30/19 12:09 AM  Result Value Ref Range Status   Enterococcus species NOT DETECTED NOT DETECTED Final   Listeria monocytogenes NOT DETECTED NOT DETECTED Final   Staphylococcus species DETECTED (A) NOT DETECTED Final    Comment: Methicillin (oxacillin) susceptible coagulase negative staphylococcus. Possible blood culture contaminant (unless isolated from more than one blood culture draw or clinical case suggests pathogenicity). No antibiotic treatment is indicated for blood  culture contaminants. CRITICAL RESULT CALLED TO, READ BACK BY AND VERIFIED WITH: MYRA SLAUGHTER AT 1800 04/30/2019.PMF    Staphylococcus aureus (BCID) NOT DETECTED NOT DETECTED Final   Methicillin resistance NOT DETECTED NOT  DETECTED Final   Streptococcus species NOT DETECTED NOT DETECTED Final   Streptococcus agalactiae NOT DETECTED NOT DETECTED Final   Streptococcus pneumoniae NOT DETECTED NOT DETECTED Final   Streptococcus pyogenes NOT DETECTED NOT DETECTED Final   Acinetobacter baumannii NOT DETECTED NOT DETECTED Final   Enterobacteriaceae species NOT DETECTED NOT DETECTED Final   Enterobacter cloacae complex NOT DETECTED NOT DETECTED Final   Escherichia coli NOT DETECTED NOT DETECTED Final   Klebsiella oxytoca NOT DETECTED NOT DETECTED Final   Klebsiella pneumoniae NOT DETECTED NOT DETECTED Final   Proteus species NOT DETECTED NOT DETECTED Final   Serratia marcescens NOT DETECTED NOT DETECTED Final   Haemophilus influenzae NOT DETECTED NOT DETECTED Final   Neisseria meningitidis NOT DETECTED NOT DETECTED Final   Pseudomonas aeruginosa NOT DETECTED NOT DETECTED Final   Candida albicans NOT DETECTED NOT DETECTED Final   Candida glabrata NOT DETECTED NOT DETECTED Final   Candida krusei NOT DETECTED NOT DETECTED Final   Candida parapsilosis NOT DETECTED NOT DETECTED Final   Candida tropicalis NOT DETECTED NOT DETECTED Final    Comment: Performed at [facility], [address] [place], [place] [address]  Blood Culture (routine x 2)     Status: None (Preliminary result)   Collection Time: 04/30/19 12:50 AM  Result Value Ref Range Status   Specimen Description BLOOD RIGHT HAND  Final   Special Requests   Final    BOTTLES DRAWN AEROBIC AND ANAEROBIC Blood Culture adequate volume Normal   Culture   Final    NO GROWTH 1 DAY Performed at [facility], [address] [place], [place] [address]    Report Status PENDING  Incomplete  Respiratory Panel by PCR     Status: None   Collection Time: 04/30/19  8:22 AM  Result Value Ref Range Status   Adenovirus NOT DETECTED NOT DETECTED Final   Coronavirus 229E NOT DETECTED NOT DETECTED Final    Comment: (NOTE) The Coronavirus on the  Respiratory Panel, DOES NOT test for the novel  Coronavirus (2019 nCoV)    Coronavirus HKU1 NOT DETECTED NOT DETECTED Final   Coronavirus NL63 NOT DETECTED NOT DETECTED Final   Coronavirus OC43 NOT DETECTED NOT DETECTED Final   Metapneumovirus NOT DETECTED NOT DETECTED Final   Rhinovirus / Enterovirus NOT DETECTED NOT DETECTED Final   Influenza A NOT DETECTED NOT DETECTED Final   Influenza B NOT DETECTED NOT DETECTED Final   Parainfluenza Virus 1 NOT DETECTED NOT DETECTED Final   Parainfluenza Virus 2 NOT DETECTED NOT DETECTED Final   Parainfluenza Virus 3 NOT DETECTED NOT DETECTED Final   Parainfluenza Virus 4 NOT DETECTED NOT DETECTED Final   Respiratory Syncytial Virus NOT DETECTED NOT DETECTED Final   Bordetella pertussis NOT DETECTED NOT DETECTED Final  Chlamydophila pneumoniae NOT DETECTED NOT DETECTED Final   Mycoplasma pneumoniae NOT DETECTED NOT DETECTED Final    Comment: Performed at [facility], [place] [address] [place], [place] [address]  MRSA PCR Screening     Status: None   Collection Time: 04/30/19  8:22 AM  Result Value Ref Range Status   MRSA by PCR NEGATIVE NEGATIVE Final    Comment:        The GeneXpert MRSA Assay (FDA approved for NASAL specimens only), is one component of a comprehensive MRSA colonization surveillance program. It is not intended to diagnose MRSA infection nor to guide or monitor treatment for MRSA infections. Performed at [facility], [address] [place], [place] [address]     Coagulation Studies: Recent Labs    04/30/19 0453  LABPROT 15.3*  INR 1.2    Urinalysis: Recent Labs    04/30/19 0347  COLORURINE YELLOW*  LABSPEC 1.007  PHURINE 9.0*  GLUCOSEU >=500*  HGBUR SMALL*  BILIRUBINUR NEGATIVE  KETONESUR NEGATIVE  PROTEINUR 100*  NITRITE NEGATIVE  LEUKOCYTESUR NEGATIVE      Imaging: Dg Chest Port 1 View  Result Date: 04/30/2019 CLINICAL DATA:  53 year old female with fever EXAM: PORTABLE CHEST  1 VIEW COMPARISON:  Chest radiograph dated 10/13/2017 FINDINGS: There is mild cardiomegaly. No focal consolidation, pleural effusion, or pneumothorax. Left subclavian vascular stent. No acute osseous pathology. IMPRESSION: No active disease. Electronically Signed   By: Anner Crete M.D.   On: 04/30/2019 00:24     Medications:   . sodium chloride Stopped (04/30/19 2340)  . ampicillin-sulbactam (UNASYN) IV Stopped (04/30/19 2158)   . amitriptyline  10 mg Oral QHS  . atorvastatin  20 mg Oral Daily  . calcium acetate  1,334 mg Oral TID WC  . diltiazem  60 mg Oral TID  . heparin  5,000 Units Subcutaneous Q8H  . insulin aspart  0-15 Units Subcutaneous TID WC  . insulin aspart  0-5 Units Subcutaneous QHS  . insulin glargine  10 Units Subcutaneous Daily  . pantoprazole  40 mg Oral Daily  . sodium chloride flush  3 mL Intravenous Q12H  . torsemide  100 mg Oral Daily   sodium chloride, bisacodyl, ibuprofen, ipratropium-albuterol, metoprolol tartrate, ondansetron **OR** ondansetron (ZOFRAN) IV, oxyCODONE, polyethylene glycol, sodium chloride flush, traZODone  Assessment/ Plan:  [name] is a 54 y.o. white female with end stage renal disease on hemodialysis, diabetes mellitus type II insulin dependent, hypertension, diastolic congestive heart failure, chronic pain syndrome, hepatitis C, diabetic neuropathy, psoriatic arthritis on Humira.   Admitted with fever, nausea, vomiting and weakness. Tmax of 103.1. COVID-19 negative.   [facility] [place]. Left AVF 98.5kg.   1. End Stage Renal Disease: Patient seen during dialysis Tolerating well  2.  Fever - work up is negative so far. No source identified - empiric antibiotics as per IM team  3. Anemia of chronic kidney disease:  - EPO with HD treatment.  -  Lab Results  Component Value Date   HGB 9.3 (L) 05/01/2019     4. Secondary Hyperparathyroidism: PTH 103 - low. With hyperphosphatemia - Continue calcium  acetate  Lab Results  Component Value Date   CALCIUM 7.7 (L) 05/01/2019   PHOS 4.2 05/01/2019     LOS: 1 Bradee Common 5/11/20203:12 PM

## 2019-05-01 NOTE — Plan of Care (Signed)
Nutrition Education Note  Spoke with patient over the phone to provide education. She reports she has been on HD for 4 years. She is familiar with renal and carbohydrate-modified diet but struggles with how to do it on a budget. Her family does not have a car so her son can only get groceries he can carry back from walking to the grocery store. She also has a hiatal hernia so she has to eat soft, well-cooked foods.   RD consulted for budget-friendly Renal Education. Left Renal Diet Food Pyramid and article on budget-friendly renal diet in patient's discharge packet. Reviewed food groups and provided written recommended serving sizes specifically determined for patient's current nutritional status.   Explained why diet restrictions are needed and provided lists of foods to limit/avoid that are high potassium, sodium, and phosphorus. Provided specific recommendations on safer alternatives of these foods. Reviewed budget-friendly foods that will fit into renal diet. Strongly encouraged compliance of this diet.   Discussed importance of protein intake at each meal and snack. Provided examples of how to maximize protein intake throughout the day. Discussed need for fluid restriction with dialysis, importance of minimizing weight gain between HD treatments, and renal-friendly beverage options.  Encouraged pt to discuss specific diet questions/concerns with RD at HD outpatient facility. Teach back method used.  Expect fair to good compliance.  Body mass index is 34.53 kg/m. Pt meets criteria for obesity class I based on current BMI.  Current diet order is carbohydrate modified with 1.5 L fluid restriction. Labs and medications reviewed. No further nutrition interventions warranted at this time. RD contact information provided. If additional nutrition issues arise, please re-consult RD.  Willey Blade, MS, [place], LDN Office: [phone] Pager: [phone] After Hours/Weekend Pager: [phone]

## 2019-05-01 NOTE — Progress Notes (Signed)
Post HD Assessment   Temp 99.3 post tx, 1.5 liters removed.    05/01/19 1226  Neurological  Level of Consciousness Alert  Orientation Level Oriented X4  Respiratory  Respiratory Pattern Regular  Chest Assessment Chest expansion symmetrical  Bilateral Breath Sounds Diminished  Cardiac  Pulse Regular  Heart Sounds S1, S2  ECG Monitor Yes  Cardiac Rhythm NSR  Vascular  R Radial Pulse +2  L Radial Pulse +2  Integumentary  Integumentary (WDL) X  Skin Color Appropriate for ethnicity  Skin Condition Dry  Musculoskeletal  Musculoskeletal (WDL) X  Generalized Weakness Yes  Gastrointestinal  Bowel Sounds Assessment Active  Psychosocial  Psychosocial (WDL) WDL

## 2019-05-01 NOTE — Discharge Summary (Addendum)
[facility] [place] NAME: [name]    MR#:  [id number]  DATE OF BIRTH:  02/02/1966  DATE OF ADMISSION:  04/29/2019 ADMITTING PHYSICIAN: Christel Mormon, MD  DATE OF DISCHARGE: 05/02/2019  PRIMARY CARE PHYSICIAN: Mikey College, NP    ADMISSION DIAGNOSIS:  Hyponatremia [E87.1] Weakness [R53.1] Fever, unspecified fever cause [R50.9] Sepsis, due to unspecified organism, unspecified whether acute organ dysfunction present ([place]) [A41.9]  DISCHARGE DIAGNOSIS:  Active Problems:   Sepsis ([place])   SECONDARY DIAGNOSIS:   Past Medical History:  Diagnosis Date  . Allergy   . Anemia   . Anxiety    worse when not at home - bowel incontinence  . Arthritis   . Ataxia   . COPD (chronic obstructive pulmonary disease) ([place])   . Depression   . Diabetes mellitus with complication ([place])   . ESRD on hemodialysis ([place])    MWF dialysis  . Fistula    left upper arm  . Hepatitis 2003   Hep C  . Hiatal hernia   . Hypercholesterolemia   . Hypertension   . Migraine   . Neuropathy, diabetic ([facility])    lower legs  . Peripheral vascular disease ([place])   . Pneumonia 2015  . Psoriasis   . Tobacco dependence   . Wears dentures    full upper, partial lower    HOSPITAL COURSE:   53 year old female with history of end-stage renal disease on hemodialysis and diabetes who presented to the emergency room due to generalized weakness and fever.  1. Fever: Patientr ruled out for sepsis at this point as there is no source of her fever.  Patient can be discharged on oral Augmentin empirically.  2.  End-stage renal disease on hemodialysis: Continue dialysis as per nephrology.   3.  Diabetes: Continue outpatient regimen with ADA diet  4.  Hyperlipidemia: Continue statin  5.  Essential hypertension: Continue diltiazem   DISCHARGE CONDITIONS AND DIET:  Stable diabetic  CONSULTS OBTAINED:    DRUG ALLERGIES:   Allergies  Allergen Reactions  .  Cinnamon Anaphylaxis  . Garlic Anaphylaxis and Hives  . Onion Hives and Swelling  . Tylenol [Acetaminophen] Anaphylaxis  . Ciprofloxacin Diarrhea  . Prednisone Other (See Comments)    Vaginal blisters & oral blisters Vaginal blisters & oral blisters Vaginal blisters & oral blisters    DISCHARGE MEDICATIONS:   Allergies as of 05/02/2019      Reactions   Cinnamon Anaphylaxis   Garlic Anaphylaxis, Hives   Onion Hives, Swelling   Tylenol [acetaminophen] Anaphylaxis   Ciprofloxacin Diarrhea   Prednisone Other (See Comments)   Vaginal blisters & oral blisters Vaginal blisters & oral blisters Vaginal blisters & oral blisters      Medication List    STOP taking these medications   barrier cream Crea Commonly known as:  non-specified     TAKE these medications   amitriptyline 10 MG tablet Commonly known as:  ELAVIL Take 10 mg by mouth at bedtime.   amoxicillin-clavulanate 500-125 MG tablet Commonly known as:  Augmentin Take 1 tablet (500 mg total) by mouth at bedtime for 5 days.   atorvastatin 20 MG tablet Commonly known as:  LIPITOR Take 1 tablet (20 mg total) by mouth daily.   calcium acetate 667 MG capsule Commonly known as:  PHOSLO Take 1,334 mg by mouth 3 (three) times daily with meals.   clobetasol ointment 0.05 % Commonly known as:  TEMOVATE Apply 1 application  topically daily as needed (for psorasis).   clobetasol 0.05 % topical foam Commonly known as:  OLUX Apply topically 2 (two) times daily.   diltiazem 60 MG tablet Commonly known as:  CARDIZEM Take 1 tablet (60 mg total) by mouth 3 (three) times daily.   glucose blood test strip Commonly known as:  True Metrix Blood Glucose Test Use as instructed   HUMIRA PEN [place] Inject into the skin every 14 (fourteen) days.   Imodium A-D 2 MG tablet Generic drug:  loperamide Take 2 mg by mouth 3 (three) times daily as needed.   insulin aspart 100 UNIT/ML injection Commonly known as:  NovoLOG Inject 1-2  Units into the skin 3 (three) times daily before meals.   insulin glargine 100 UNIT/ML injection Commonly known as:  LANTUS Inject 0.1 mLs (10 Units total) into the skin daily.   INSULIN SYRINGE .5CC/29G 29G X 1/2" 0.5 ML Misc Commonly known as:  TRUEplus Insulin Syringe Use 4 times a day as directed per provider.   ketoconazole 2 % cream Commonly known as:  NIZORAL APPLY TOPICALLY ONTO THE SKIN TWICE A DAY AS NEEDED   levocetirizine 5 MG tablet Commonly known as:  XYZAL TAKE ONE TABLET BY MOUTH EVERY EVENING What changed:  See the new instructions.   midodrine 5 MG tablet Commonly known as:  PROAMATINE TK 1 T PO  DURING DIALYSIS TREATMENT FOR LOW BP   oxyCODONE 5 MG immediate release tablet Commonly known as:  Oxy IR/ROXICODONE Take 1 tablet (5 mg total) by mouth 2 (two) times daily as needed for up to 30 days for severe pain. Must last 30 days. What changed:  Another medication with the same name was removed. Continue taking this medication, and follow the directions you see here.   torsemide 100 MG tablet Commonly known as:  DEMADEX Take 100 mg by mouth daily.   Zofran 8 MG tablet Generic drug:  ondansetron Take 8 mg by mouth every 8 (eight) hours as needed.         Today   CHIEF COMPLAINT:  Doing ok this am   VITAL SIGNS:  Blood pressure (!) 140/49, pulse 68, temperature 97.6 F (36.4 C), temperature source Oral, resp. rate 20, height 5\' 7"  (1.702 m), weight 103.4 kg, SpO2 100 %.   REVIEW OF SYSTEMS:  Review of Systems  Constitutional: Negative.  Negative for chills, fever and malaise/fatigue.  HENT: Negative.  Negative for ear discharge, ear pain, hearing loss, nosebleeds and sore throat.   Eyes: Negative.  Negative for blurred vision and pain.  Respiratory: Negative.  Negative for cough, hemoptysis, shortness of breath and wheezing.   Cardiovascular: Negative.  Negative for chest pain, palpitations and leg swelling.  Gastrointestinal: Negative.   Negative for abdominal pain, blood in stool, diarrhea, nausea and vomiting.  Genitourinary: Negative.  Negative for dysuria.  Musculoskeletal: Negative.  Negative for back pain.  Skin: Negative.   Neurological: Negative for dizziness, tremors, speech change, focal weakness, seizures and headaches.  Endo/Heme/Allergies: Negative.  Does not bruise/bleed easily.  Psychiatric/Behavioral: Negative.  Negative for depression, hallucinations and suicidal ideas.     PHYSICAL EXAMINATION:  GENERAL:  53 y.o.-year-old patient lying in the bed with no acute distress.  NECK:  Supple, no jugular venous distention. No thyroid enlargement, no tenderness.  LUNGS: Normal breath sounds bilaterally, no wheezing, rales,rhonchi  No use of accessory muscles of respiration.  CARDIOVASCULAR: S1, S2 normal. No murmurs, rubs, or gallops.  ABDOMEN: Soft, non-tender, non-distended. Bowel sounds present.  No organomegaly or mass.  EXTREMITIES: No pedal edema, cyanosis, or clubbing.  PSYCHIATRIC: The patient is alert and oriented x 3.  SKIN: No obvious rash, lesion, or ulcer.   DATA REVIEW:   CBC Recent Labs  Lab 05/01/19 0015  WBC 6.4  HGB 9.3*  HCT 27.8*  PLT 78*    Chemistries  Recent Labs  Lab 04/30/19 0050  05/01/19 0015  NA 129*   < > 128*  K 5.3*   < > 5.2*  CL 94*   < > 96*  CO2 22   < > 21*  GLUCOSE 123*   < > 152*  BUN 34*   < > 52*  CREATININE 5.79*   < > 7.50*  CALCIUM 7.8*   < > 7.7*  AST 40  --   --   ALT 33  --   --   ALKPHOS 82  --   --   BILITOT 0.7  --   --    < > = values in this interval not displayed.    Cardiac Enzymes Recent Labs  Lab 04/30/19 0050  TROPONINI 0.06*    Microbiology Results  @MICRORSLT48 @  RADIOLOGY:  No results found.    Allergies as of 05/02/2019      Reactions   Cinnamon Anaphylaxis   Garlic Anaphylaxis, Hives   Onion Hives, Swelling   Tylenol [acetaminophen] Anaphylaxis   Ciprofloxacin Diarrhea   Prednisone Other (See Comments)    Vaginal blisters & oral blisters Vaginal blisters & oral blisters Vaginal blisters & oral blisters      Medication List    STOP taking these medications   barrier cream Crea Commonly known as:  non-specified     TAKE these medications   amitriptyline 10 MG tablet Commonly known as:  ELAVIL Take 10 mg by mouth at bedtime.   amoxicillin-clavulanate 500-125 MG tablet Commonly known as:  Augmentin Take 1 tablet (500 mg total) by mouth at bedtime for 5 days.   atorvastatin 20 MG tablet Commonly known as:  LIPITOR Take 1 tablet (20 mg total) by mouth daily.   calcium acetate 667 MG capsule Commonly known as:  PHOSLO Take 1,334 mg by mouth 3 (three) times daily with meals.   clobetasol ointment 0.05 % Commonly known as:  TEMOVATE Apply 1 application topically daily as needed (for psorasis).   clobetasol 0.05 % topical foam Commonly known as:  OLUX Apply topically 2 (two) times daily.   diltiazem 60 MG tablet Commonly known as:  CARDIZEM Take 1 tablet (60 mg total) by mouth 3 (three) times daily.   glucose blood test strip Commonly known as:  True Metrix Blood Glucose Test Use as instructed   HUMIRA PEN [place] Inject into the skin every 14 (fourteen) days.   Imodium A-D 2 MG tablet Generic drug:  loperamide Take 2 mg by mouth 3 (three) times daily as needed.   insulin aspart 100 UNIT/ML injection Commonly known as:  NovoLOG Inject 1-2 Units into the skin 3 (three) times daily before meals.   insulin glargine 100 UNIT/ML injection Commonly known as:  LANTUS Inject 0.1 mLs (10 Units total) into the skin daily.   INSULIN SYRINGE .5CC/29G 29G X 1/2" 0.5 ML Misc Commonly known as:  TRUEplus Insulin Syringe Use 4 times a day as directed per provider.   ketoconazole 2 % cream Commonly known as:  NIZORAL APPLY TOPICALLY ONTO THE SKIN TWICE A DAY AS NEEDED   levocetirizine 5 MG tablet Commonly known as:  XYZAL TAKE ONE TABLET BY MOUTH EVERY EVENING What changed:  See  the new instructions.   midodrine 5 MG tablet Commonly known as:  PROAMATINE TK 1 T PO  DURING DIALYSIS TREATMENT FOR LOW BP   oxyCODONE 5 MG immediate release tablet Commonly known as:  Oxy IR/ROXICODONE Take 1 tablet (5 mg total) by mouth 2 (two) times daily as needed for up to 30 days for severe pain. Must last 30 days. What changed:  Another medication with the same name was removed. Continue taking this medication, and follow the directions you see here.   torsemide 100 MG tablet Commonly known as:  DEMADEX Take 100 mg by mouth daily.   Zofran 8 MG tablet Generic drug:  ondansetron Take 8 mg by mouth every 8 (eight) hours as needed.          Management plans discussed with the patient and she is in agreement. Stable for discharge home  Patient should follow up with pcp  CODE STATUS:     Code Status Orders  (From admission, onward)         Start     Ordered   04/30/19 0307  Full code  Continuous     04/30/19 0309        Code Status History    Date Active Date Inactive Code Status Order ID Comments User Context   12/15/2017 0259 12/19/2017 0113 Full Code [id number]  Max Sane, MD Inpatient   05/19/2017 1657 05/21/2017 2120 Full Code [id number]  Hillary Bow, MD ED   01/08/2017 1818 01/12/2017 1332 Full Code [id number]  Dustin Flock, MD Inpatient   07/13/2016 0308 07/14/2016 0910 Full Code [id number]  Saundra Shelling, MD Inpatient   06/06/2016 2213 06/11/2016 0051 Full Code [id number]  Henreitta Leber, MD Inpatient      TOTAL TIME TAKING CARE OF THIS PATIENT: 38 minutes.    Note: This dictation was prepared with Dragon dictation along with smaller phrase technology. Any transcriptional errors that result from this process are unintentional.  Bettey Costa M.D on 05/02/2019 at 8:46 AM  Between 7am to 6pm - Pager - [phone] After 6pm go to [email or web address] - password [email or web address] [facility]  Office  [phone]  CC: Primary care physician;  Mikey College, NP

## 2019-05-01 NOTE — Progress Notes (Signed)
Pt had fevers this shift. Unresolved with motrin. MD paged, New order for cooling blanket. Cooling blanket was effective.

## 2019-05-01 NOTE — Progress Notes (Signed)
Pre HD Tx Assessment   05/01/19 0900  Neurological  Level of Consciousness Alert  Orientation Level Oriented X4  Respiratory  Respiratory Pattern Regular  Chest Assessment Chest expansion symmetrical  Bilateral Breath Sounds Diminished  Cardiac  Pulse Regular  Heart Sounds S1, S2  ECG Monitor Yes  Cardiac Rhythm NSR  Vascular  R Radial Pulse +2  L Radial Pulse +2  Integumentary  Integumentary (WDL) X  Skin Color Appropriate for ethnicity  Skin Condition Dry  Additional Integumentary Comments  (HD pt)  Musculoskeletal  Musculoskeletal (WDL) X  Generalized Weakness Yes  Gastrointestinal  Bowel Sounds Assessment Active  Last BM Date 04/30/19  Psychosocial  Psychosocial (WDL) WDL

## 2019-05-01 NOTE — Progress Notes (Signed)
HD Tx Start   05/01/19 0925  Vital Signs  Pulse Rate 85  Resp 18  BP (!) 183/62  Oxygen Therapy  SpO2 99 %  O2 Device Room Air  During Hemodialysis Assessment  Blood Flow Rate (mL/min) 400 mL/min  Arterial Pressure (mmHg) -140 mmHg  Venous Pressure (mmHg) 180 mmHg  Transmembrane Pressure (mmHg) 50 mmHg  Ultrafiltration Rate (mL/min) 670 mL/min (670 mL per HOUR)  Dialysate Flow Rate (mL/min) 600 ml/min  Conductivity: Machine  14  HD Safety Checks Performed Yes  Dialysis Fluid Bolus Normal Saline  Bolus Amount (mL) 250 mL  Intra-Hemodialysis Comments Tx initiated

## 2019-05-01 NOTE — Progress Notes (Signed)
Pt is requesting an ENT see her for ringing in her ears and headaches. Notified MD. Domenica Fail administered. Will continue to monitor.

## 2019-05-01 NOTE — Progress Notes (Signed)
HD Tx End  Temp 99.3 post tx, 1.5 liters removed.    05/01/19 1224  Vital Signs  Temp 99.3 F (37.4 C)  Temp Source Oral  Pulse Rate (!) 101  Pulse Rate Source Monitor  Resp 19  BP (!) 157/59  BP Location Right Arm  BP Method Automatic  Patient Position (if appropriate) Lying  Oxygen Therapy  O2 Device Room Air  Pain Assessment  Pain Scale 0-10  Pain Score 0  Dialysis Weight  Weight 103.4 kg  Type of Weight Post-Dialysis  During Hemodialysis Assessment  Blood Flow Rate (mL/min) 200 mL/min  Arterial Pressure (mmHg) -150 mmHg  Venous Pressure (mmHg) 200 mmHg  Transmembrane Pressure (mmHg) 50 mmHg  Ultrafiltration Rate (mL/min) 670 mL/min  Dialysate Flow Rate (mL/min) 600 ml/min  Conductivity: Machine  14  HD Safety Checks Performed Yes  Dialysis Fluid Bolus Normal Saline  Bolus Amount (mL) 250 mL  Intra-Hemodialysis Comments Tx completed

## 2019-05-01 NOTE — Progress Notes (Signed)
PT Cancellation Note  Patient Details Name: BLAYKLEE MABLE MRN: [id number] DOB: [birth year]   Cancelled Treatment:    Reason Eval/Treat Not Completed: Patient at procedure or test/unavailable Chart reviewed. Patient at hemodialysis. PT will follow-up at a later time/date.  Myles Gip PT, DPT [phone] 05/01/2019, 9:44 AM

## 2019-05-01 NOTE — Progress Notes (Signed)
Pre HD Tx   05/01/19 0910  Vital Signs  Temp 99.4 F (37.4 C)  Temp Source Oral  Pulse Rate 82  Pulse Rate Source Monitor  Resp 16  BP (!) 171/80  BP Location Right Arm  BP Method Automatic  Patient Position (if appropriate) Lying  Oxygen Therapy  SpO2 99 %  O2 Device Room Air  Pain Assessment  Pain Scale 0-10  Pain Score 0  Dialysis Weight  Weight 105.3 kg  Type of Weight Pre-Dialysis  Time-Out for Hemodialysis  What Procedure? Hemodialysis  Pt Identifiers(min of two) First/Last Name;MRN/Account#  Correct Site? Yes  Correct Side? Yes  Correct Procedure? Yes  Consents Verified? Yes  Rad Studies Available? N/A  Safety Precautions Reviewed? Yes  Engineer, civil (consulting) Number 4  Station Number 4  UF/Alarm Test Passed  Conductivity: Meter 14  Conductivity: Machine  14  pH 7.4  Reverse Osmosis Main  Normal Saline Lot Number [id number]  Dialyzer Lot Number [id number]  Disposable Set Lot Number [id number]  Machine Temperature 98.6 F (37 C)  Musician and Audible Yes  Blood Lines Intact and Secured Yes  Pre Treatment Patient Checks  Vascular access used during treatment Fistula  Hepatitis B Surface Antigen Results Negative  Date Hepatitis B Surface Antigen Drawn 07/11/17  Hepatitis B Surface Antibody  (180)  Date Hepatitis B Surface Antibody Drawn 07/12/18  Hemodialysis Consent Verified Yes  Hemodialysis Standing Orders Initiated Yes  ECG (Telemetry) Monitor On Yes  Prime Ordered Normal Saline  Length of  DialysisTreatment -hour(s) 3 Hour(s)  Dialyzer Elisio 17H NR  Dialysate 3K, 2.5 Ca  Dialysate Flow Ordered 600  Blood Flow Rate Ordered 400 mL/min  Ultrafiltration Goal 1.5 Liters  Dialysis Blood Pressure Support Ordered Normal Saline  Education / Care Plan  Dialysis Education Provided Yes  Documented Education in Care Plan Yes  Fistula / Graft Left Upper arm Arteriovenous fistula  No Placement Date or Time found.   Orientation: Left  Access Location:  Upper arm  Access Type: Arteriovenous fistula  Site Condition No complications  Fistula / Graft Assessment Present;Thrill;Bruit  Status Accessed  Needle Size 15  Drainage Description None

## 2019-05-01 NOTE — Progress Notes (Signed)
[facility] at [place] NAME: [name]    MR#:  [id number]  DATE OF BIRTH:  1966/03/22  SUBJECTIVE:   Patient withut fever overnight  REVIEW OF SYSTEMS:    Review of Systems  Constitutional: Negative for fever, chills weight loss HENT: Negative for ear pain, nosebleeds, congestion, facial swelling, rhinorrhea, neck pain, neck stiffness and ear discharge.   Respiratory: Negative for cough, shortness of breath, wheezing  Cardiovascular: Negative for chest pain, palpitations and leg swelling.  Gastrointestinal: Negative for heartburn, abdominal pain, vomiting, diarrhea or consitpation Genitourinary: Negative for dysuria, urgency, frequency, hematuria Musculoskeletal: Negative for back pain or joint pain Neurological: Negative for dizziness, seizures, syncope, focal weakness,  numbness and headaches.  Hematological: Does not bruise/bleed easily.  Psychiatric/Behavioral: Negative for hallucinations, confusion, dysphoric mood    Tolerating Diet: yes      DRUG ALLERGIES:   Allergies  Allergen Reactions  . Cinnamon Anaphylaxis  . Garlic Anaphylaxis and Hives  . Onion Hives and Swelling  . Tylenol [Acetaminophen] Anaphylaxis  . Ciprofloxacin Diarrhea  . Prednisone Other (See Comments)    Vaginal blisters & oral blisters Vaginal blisters & oral blisters Vaginal blisters & oral blisters    VITALS:  Blood pressure (!) 157/74, pulse 97, temperature 99.4 F (37.4 C), temperature source Oral, resp. rate 15, height 5\' 7"  (1.702 m), weight 105.3 kg, SpO2 97 %.  PHYSICAL EXAMINATION:  Constitutional: Appears well-developed and well-nourished. No distress. HENT: Normocephalic. Marland Kitchen Oropharynx is clear and moist.  Eyes: Conjunctivae and EOM are normal. PERRLA, no scleral icterus.  Neck: Normal ROM. Neck supple. No JVD. No tracheal deviation. CVS: RRR, S1/S2 +, no murmurs, no gallops, no carotid bruit.  Pulmonary: Effort and breath sounds  normal, no stridor, rhonchi, wheezes, rales.  Abdominal: Soft. BS +,  no distension, tenderness, rebound or guarding.  Musculoskeletal: Normal range of motion. No edema and no tenderness.  Neuro: Alert. CN 2-12 grossly intact. No focal deficits. Skin: Skin is warm and dry. No rash noted. Psychiatric: Normal mood and affect.      LABORATORY PANEL:   CBC Recent Labs  Lab 05/01/19 0015  WBC 6.4  HGB 9.3*  HCT 27.8*  PLT 78*   ------------------------------------------------------------------------------------------------------------------  Chemistries  Recent Labs  Lab 04/30/19 0050  05/01/19 0015  NA 129*   < > 128*  K 5.3*   < > 5.2*  CL 94*   < > 96*  CO2 22   < > 21*  GLUCOSE 123*   < > 152*  BUN 34*   < > 52*  CREATININE 5.79*   < > 7.50*  CALCIUM 7.8*   < > 7.7*  AST 40  --   --   ALT 33  --   --   ALKPHOS 82  --   --   BILITOT 0.7  --   --    < > = values in this interval not displayed.   ------------------------------------------------------------------------------------------------------------------  Cardiac Enzymes Recent Labs  Lab 04/30/19 0050  TROPONINI 0.06*   ------------------------------------------------------------------------------------------------------------------  RADIOLOGY:  Dg Chest Port 1 View  Result Date: 04/30/2019 CLINICAL DATA:  53 year old female with fever EXAM: PORTABLE CHEST 1 VIEW COMPARISON:  Chest radiograph dated 10/13/2017 FINDINGS: There is mild cardiomegaly. No focal consolidation, pleural effusion, or pneumothorax. Left subclavian vascular stent. No acute osseous pathology. IMPRESSION: No active disease. Electronically Signed   By: Anner Crete M.D.   On: 04/30/2019 00:24     ASSESSMENT AND  PLAN:   53 year old female with history of end-stage renal disease on hemodialysis and diabetes who presented to the emergency room due to generalized weakness and fever.  1. Fever: Patientr ruled out for sepsis at this  point as there is no source of her fever.  Patient can be discharged on oral Augmentin empirically. 1/4 blood cx staph epi which is contaminant.   2.  End-stage renal disease on hemodialysis: Continue dialysis as per nephrology.   3.  Diabetes: Continue outpatient regimen with ADA diet  4.  Hyperlipidemia: Continue statin  5.  Essential hypertension: Continue diltiazem      Management plans discussed with the patient and she is in agreement.  CODE STATUS: full  TOTAL TIME TAKING CARE OF THIS PATIENT: 30 minutes.     POSSIBLE D/C tomorrow, DEPENDING ON CLINICAL CONDITION.   Bettey Costa M.D on 05/01/2019 at 11:45 AM  Between 7am to 6pm - Pager - [phone] After 6pm go to [email or web address] - password [email or web address] [facility]  Office  [phone]  CC: Primary care physician; Mikey College, NP  Note: This dictation was prepared with Dragon dictation along with smaller phrase technology. Any transcriptional errors that result from this process are unintentional.

## 2019-05-02 LAB — GLUCOSE, CAPILLARY: Glucose-Capillary: 223 mg/dL — ABNORMAL HIGH (ref 70–99)

## 2019-05-02 LAB — CULTURE, BLOOD (ROUTINE X 2)

## 2019-05-02 LAB — HIV ANTIBODY (ROUTINE TESTING W REFLEX): HIV Screen 4th Generation wRfx: NONREACTIVE

## 2019-05-02 NOTE — Progress Notes (Signed)
Discharge instructions given and went over with patient at bedside. All questions answered. Patient discharged home with family. Madlyn Frankel, RN

## 2019-05-02 NOTE — Plan of Care (Signed)
  Problem: Clinical Measurements: Goal: Signs and symptoms of infection will decrease 05/02/2019 0322 by Denice Bors, RN Outcome: Progressing 05/02/2019 0322 by Denice Bors, RN Outcome: Progressing

## 2019-05-03 ENCOUNTER — Telehealth: Payer: Self-pay

## 2019-05-03 DIAGNOSIS — N186 End stage renal disease: Secondary | ICD-10-CM | POA: Diagnosis not present

## 2019-05-03 DIAGNOSIS — D509 Iron deficiency anemia, unspecified: Secondary | ICD-10-CM | POA: Diagnosis not present

## 2019-05-03 DIAGNOSIS — D631 Anemia in chronic kidney disease: Secondary | ICD-10-CM | POA: Diagnosis not present

## 2019-05-03 DIAGNOSIS — N2581 Secondary hyperparathyroidism of renal origin: Secondary | ICD-10-CM | POA: Diagnosis not present

## 2019-05-03 DIAGNOSIS — Z992 Dependence on renal dialysis: Secondary | ICD-10-CM | POA: Diagnosis not present

## 2019-05-03 NOTE — Telephone Encounter (Signed)
Transition Care Management Follow-up Telephone Call  Date of discharge and from where: 05/02/2019 at [facility]  How have you been since you were released from the hospital? "bad, I still have a fever. It was 99.5 this morning. I called to make sure I could come to dialysis this morning and they said it was fine. "  Any questions or concerns? No "I just want to know what is going on"  Items Reviewed:  Did the pt receive and understand the discharge instructions provided? Yes   Medications obtained and verified? Yes   Any new allergies since your discharge? No   Dietary orders reviewed? Yes  Do you have support at home? Yes   Functional Questionnaire: (I = Independent and D = Dependent) ADLs:   Bathing/Dressing- d, boyfriend is helping some   Meal Prep- d, son and boyfriend helps  Eating- i  Maintaining continence- i  Transferring/Ambulation- d, cane and walker   Managing Meds- i  Follow up appointments reviewed:   PCP Hospital f/u appt confirmed? Yes  Scheduled to see Dr.karamalegos on 05/09/2019 @ 2pm via virtual visit.  [facility] f/u appt confirmed? Yes  Scheduled to see Dr.Arida on 05/18/2019 @ 8:00am virtually .  Are transportation arrangements needed? No  uses [employer] medical transportation and son.   If their condition worsens, is the pt aware to call PCP or go to the Emergency Dept.? Yes  Was the patient provided with contact information for the PCP's office or ED? Yes  Was to pt encouraged to call back with questions or concerns? Yes

## 2019-05-05 DIAGNOSIS — N186 End stage renal disease: Secondary | ICD-10-CM | POA: Diagnosis not present

## 2019-05-05 DIAGNOSIS — D631 Anemia in chronic kidney disease: Secondary | ICD-10-CM | POA: Diagnosis not present

## 2019-05-05 DIAGNOSIS — D509 Iron deficiency anemia, unspecified: Secondary | ICD-10-CM | POA: Diagnosis not present

## 2019-05-05 DIAGNOSIS — N2581 Secondary hyperparathyroidism of renal origin: Secondary | ICD-10-CM | POA: Diagnosis not present

## 2019-05-05 DIAGNOSIS — Z992 Dependence on renal dialysis: Secondary | ICD-10-CM | POA: Diagnosis not present

## 2019-05-05 LAB — CULTURE, BLOOD (ROUTINE X 2): Culture: NO GROWTH

## 2019-05-08 DIAGNOSIS — D509 Iron deficiency anemia, unspecified: Secondary | ICD-10-CM | POA: Diagnosis not present

## 2019-05-08 DIAGNOSIS — D631 Anemia in chronic kidney disease: Secondary | ICD-10-CM | POA: Diagnosis not present

## 2019-05-08 DIAGNOSIS — N186 End stage renal disease: Secondary | ICD-10-CM | POA: Diagnosis not present

## 2019-05-08 DIAGNOSIS — Z992 Dependence on renal dialysis: Secondary | ICD-10-CM | POA: Diagnosis not present

## 2019-05-08 DIAGNOSIS — N2581 Secondary hyperparathyroidism of renal origin: Secondary | ICD-10-CM | POA: Diagnosis not present

## 2019-05-09 ENCOUNTER — Ambulatory Visit (INDEPENDENT_AMBULATORY_CARE_PROVIDER_SITE_OTHER): Payer: Medicare Other | Admitting: Family Medicine

## 2019-05-09 ENCOUNTER — Other Ambulatory Visit: Payer: Self-pay

## 2019-05-09 DIAGNOSIS — Z0000 Encounter for general adult medical examination without abnormal findings: Secondary | ICD-10-CM

## 2019-05-10 DIAGNOSIS — N186 End stage renal disease: Secondary | ICD-10-CM | POA: Diagnosis not present

## 2019-05-10 DIAGNOSIS — N2581 Secondary hyperparathyroidism of renal origin: Secondary | ICD-10-CM | POA: Diagnosis not present

## 2019-05-10 DIAGNOSIS — Z992 Dependence on renal dialysis: Secondary | ICD-10-CM | POA: Diagnosis not present

## 2019-05-10 DIAGNOSIS — D509 Iron deficiency anemia, unspecified: Secondary | ICD-10-CM | POA: Diagnosis not present

## 2019-05-10 DIAGNOSIS — D631 Anemia in chronic kidney disease: Secondary | ICD-10-CM | POA: Diagnosis not present

## 2019-05-10 NOTE — Progress Notes (Signed)
Patient was a no show for virtual visit apt on Tues 05/09/19. No medical evaluation was completed.  Nobie Putnam, DO [facility] 05/09/2019 7:59 PM

## 2019-05-12 DIAGNOSIS — Z992 Dependence on renal dialysis: Secondary | ICD-10-CM | POA: Diagnosis not present

## 2019-05-12 DIAGNOSIS — N186 End stage renal disease: Secondary | ICD-10-CM | POA: Diagnosis not present

## 2019-05-12 DIAGNOSIS — D509 Iron deficiency anemia, unspecified: Secondary | ICD-10-CM | POA: Diagnosis not present

## 2019-05-12 DIAGNOSIS — D631 Anemia in chronic kidney disease: Secondary | ICD-10-CM | POA: Diagnosis not present

## 2019-05-12 DIAGNOSIS — N2581 Secondary hyperparathyroidism of renal origin: Secondary | ICD-10-CM | POA: Diagnosis not present

## 2019-05-15 DIAGNOSIS — Z992 Dependence on renal dialysis: Secondary | ICD-10-CM | POA: Diagnosis not present

## 2019-05-15 DIAGNOSIS — D631 Anemia in chronic kidney disease: Secondary | ICD-10-CM | POA: Diagnosis not present

## 2019-05-15 DIAGNOSIS — N186 End stage renal disease: Secondary | ICD-10-CM | POA: Diagnosis not present

## 2019-05-15 DIAGNOSIS — N2581 Secondary hyperparathyroidism of renal origin: Secondary | ICD-10-CM | POA: Diagnosis not present

## 2019-05-15 DIAGNOSIS — D509 Iron deficiency anemia, unspecified: Secondary | ICD-10-CM | POA: Diagnosis not present

## 2019-05-17 DIAGNOSIS — Z992 Dependence on renal dialysis: Secondary | ICD-10-CM | POA: Diagnosis not present

## 2019-05-17 DIAGNOSIS — D509 Iron deficiency anemia, unspecified: Secondary | ICD-10-CM | POA: Diagnosis not present

## 2019-05-17 DIAGNOSIS — N2581 Secondary hyperparathyroidism of renal origin: Secondary | ICD-10-CM | POA: Diagnosis not present

## 2019-05-17 DIAGNOSIS — N186 End stage renal disease: Secondary | ICD-10-CM | POA: Diagnosis not present

## 2019-05-17 DIAGNOSIS — D631 Anemia in chronic kidney disease: Secondary | ICD-10-CM | POA: Diagnosis not present

## 2019-05-18 ENCOUNTER — Telehealth: Payer: Medicare Other | Admitting: Cardiovascular Disease

## 2019-05-19 DIAGNOSIS — D509 Iron deficiency anemia, unspecified: Secondary | ICD-10-CM | POA: Diagnosis not present

## 2019-05-19 DIAGNOSIS — Z992 Dependence on renal dialysis: Secondary | ICD-10-CM | POA: Diagnosis not present

## 2019-05-19 DIAGNOSIS — N2581 Secondary hyperparathyroidism of renal origin: Secondary | ICD-10-CM | POA: Diagnosis not present

## 2019-05-19 DIAGNOSIS — D631 Anemia in chronic kidney disease: Secondary | ICD-10-CM | POA: Diagnosis not present

## 2019-05-19 DIAGNOSIS — N186 End stage renal disease: Secondary | ICD-10-CM | POA: Diagnosis not present

## 2019-05-21 DIAGNOSIS — Z992 Dependence on renal dialysis: Secondary | ICD-10-CM | POA: Diagnosis not present

## 2019-05-21 DIAGNOSIS — N186 End stage renal disease: Secondary | ICD-10-CM | POA: Diagnosis not present

## 2019-05-22 DIAGNOSIS — E113512 Type 2 diabetes mellitus with proliferative diabetic retinopathy with macular edema, left eye: Secondary | ICD-10-CM | POA: Diagnosis not present

## 2019-05-22 DIAGNOSIS — D631 Anemia in chronic kidney disease: Secondary | ICD-10-CM | POA: Diagnosis not present

## 2019-05-22 DIAGNOSIS — N186 End stage renal disease: Secondary | ICD-10-CM | POA: Diagnosis not present

## 2019-05-22 DIAGNOSIS — D509 Iron deficiency anemia, unspecified: Secondary | ICD-10-CM | POA: Diagnosis not present

## 2019-05-22 DIAGNOSIS — N2581 Secondary hyperparathyroidism of renal origin: Secondary | ICD-10-CM | POA: Diagnosis not present

## 2019-05-22 DIAGNOSIS — H4312 Vitreous hemorrhage, left eye: Secondary | ICD-10-CM | POA: Diagnosis not present

## 2019-05-22 DIAGNOSIS — Z992 Dependence on renal dialysis: Secondary | ICD-10-CM | POA: Diagnosis not present

## 2019-05-22 DIAGNOSIS — E113511 Type 2 diabetes mellitus with proliferative diabetic retinopathy with macular edema, right eye: Secondary | ICD-10-CM | POA: Diagnosis not present

## 2019-05-24 DIAGNOSIS — Z992 Dependence on renal dialysis: Secondary | ICD-10-CM | POA: Diagnosis not present

## 2019-05-24 DIAGNOSIS — N186 End stage renal disease: Secondary | ICD-10-CM | POA: Diagnosis not present

## 2019-05-24 DIAGNOSIS — D631 Anemia in chronic kidney disease: Secondary | ICD-10-CM | POA: Diagnosis not present

## 2019-05-24 DIAGNOSIS — D509 Iron deficiency anemia, unspecified: Secondary | ICD-10-CM | POA: Diagnosis not present

## 2019-05-24 DIAGNOSIS — N2581 Secondary hyperparathyroidism of renal origin: Secondary | ICD-10-CM | POA: Diagnosis not present

## 2019-05-26 DIAGNOSIS — N186 End stage renal disease: Secondary | ICD-10-CM | POA: Diagnosis not present

## 2019-05-26 DIAGNOSIS — D509 Iron deficiency anemia, unspecified: Secondary | ICD-10-CM | POA: Diagnosis not present

## 2019-05-26 DIAGNOSIS — D631 Anemia in chronic kidney disease: Secondary | ICD-10-CM | POA: Diagnosis not present

## 2019-05-26 DIAGNOSIS — Z992 Dependence on renal dialysis: Secondary | ICD-10-CM | POA: Diagnosis not present

## 2019-05-26 DIAGNOSIS — N2581 Secondary hyperparathyroidism of renal origin: Secondary | ICD-10-CM | POA: Diagnosis not present

## 2019-05-29 ENCOUNTER — Ambulatory Visit: Payer: Self-pay | Admitting: Nurse Practitioner

## 2019-05-29 ENCOUNTER — Encounter: Payer: Self-pay | Admitting: Family Medicine

## 2019-05-29 ENCOUNTER — Other Ambulatory Visit: Payer: Self-pay

## 2019-05-29 ENCOUNTER — Ambulatory Visit (INDEPENDENT_AMBULATORY_CARE_PROVIDER_SITE_OTHER): Payer: Medicare Other | Admitting: Family Medicine

## 2019-05-29 VITALS — BP 180/72 | HR 84 | Temp 98.6°F | Resp 16 | Ht 67.0 in | Wt 221.0 lb

## 2019-05-29 DIAGNOSIS — Z992 Dependence on renal dialysis: Secondary | ICD-10-CM | POA: Diagnosis not present

## 2019-05-29 DIAGNOSIS — N186 End stage renal disease: Secondary | ICD-10-CM

## 2019-05-29 DIAGNOSIS — E1122 Type 2 diabetes mellitus with diabetic chronic kidney disease: Secondary | ICD-10-CM | POA: Diagnosis not present

## 2019-05-29 DIAGNOSIS — D631 Anemia in chronic kidney disease: Secondary | ICD-10-CM | POA: Diagnosis not present

## 2019-05-29 DIAGNOSIS — D509 Iron deficiency anemia, unspecified: Secondary | ICD-10-CM | POA: Diagnosis not present

## 2019-05-29 DIAGNOSIS — N2581 Secondary hyperparathyroidism of renal origin: Secondary | ICD-10-CM | POA: Diagnosis not present

## 2019-05-29 LAB — POCT GLYCOSYLATED HEMOGLOBIN (HGB A1C): Hemoglobin A1C: 7.1 % — AB (ref 4.0–5.6)

## 2019-05-29 MED ORDER — DULAGLUTIDE 0.75 MG/0.5ML ~~LOC~~ SOAJ: 0.7500 mg | SUBCUTANEOUS | 0 refills | Status: DC

## 2019-05-29 NOTE — Addendum Note (Signed)
Addended by: Olin Hauser on: 05/29/2019 05:24 PM   Modules accepted: Orders

## 2019-05-29 NOTE — Progress Notes (Addendum)
Subjective:    Patient ID: [name], female    DOB: 1966/10/07, 53 y.o.   MRN: [id number]  [name] is a 53 y.o. female presenting on 05/29/2019 for Diabetes (obtw pt tested negative in past for covid being in hospital)  PCP is Cassell Smiles, AGPCNP-BC - I am currently covering during her maternity leave.   HPI   CHRONIC DM, Type 2 / ESRD on HD Reports last visit with PCP 02/2019, advised that she could switch to a GLP1 agent, advised her to return now to discuss this further with goal for weight loss. She is on chronic insulin therapy has ESRD on HD M-W-F Meds: Lantus 10 units daily, Novolog mealtime 2-3 units x 3 daily with meals (not take if missed meal) Reports good compliance. Tolerating well w/o side-effects does admit to some wt gain on insulin Lifestyle: - Diet (improving diabetic diet)  [facility], retinopathy, cataracts, L eye blind - will send [place] report from [facility] Denies hypoglycemia, polyuria, visual changes, numbness or tingling.   Depression screen [facility] 2/9 02/14/2019 12/07/2018 11/17/2017  Decreased Interest 0 0 0  Down, Depressed, Hopeless 0 0 0  PHQ - 2 Score 0 0 0  Altered sleeping - - -  Tired, decreased energy - - -  Change in appetite - - -  Feeling bad or failure about yourself  - - -  Trouble concentrating - - -  Moving slowly or fidgety/restless - - -  Suicidal thoughts - - -  PHQ-9 Score - - -    Social History   Tobacco Use  . Smoking status: Light Tobacco Smoker    Packs/day: 0.25    Years: 20.00    Pack years: 5.00    Types: Cigarettes    Last attempt to quit: 05/21/2017    Years since quitting: 2.0  . Smokeless tobacco: Current User  Substance Use Topics  . Alcohol use: No  . Drug use: No    Review of Systems Per HPI unless specifically indicated above     Objective:    BP (!) 180/72   Pulse 84   Temp 98.6 F (37 C) (Oral)   Resp 16   Ht 5\' 7"  (1.702 m)   Wt 221 lb (100.2 kg)   BMI 34.61 kg/m   Wt Readings  from Last 3 Encounters:  05/29/19 221 lb (100.2 kg)  05/01/19 227 lb 15.3 oz (103.4 kg)  02/22/19 219 lb 3.2 oz (99.4 kg)    Physical Exam Vitals signs and nursing note reviewed.  Constitutional:      General: She is not in acute distress.    Appearance: She is well-developed. She is not diaphoretic.     Comments: Well-appearing, comfortable, cooperative  HENT:     Head: Normocephalic and atraumatic.  Eyes:     General:        Right eye: No discharge.        Left eye: No discharge.     Conjunctiva/sclera: Conjunctivae normal.  Cardiovascular:     Rate and Rhythm: Normal rate.  Pulmonary:     Effort: Pulmonary effort is normal.  Skin:    General: Skin is warm and dry.     Findings: No erythema or rash.  Neurological:     Mental Status: She is alert and oriented to person, place, and time.  Psychiatric:        Behavior: Behavior normal.     Comments: Well groomed, good eye contact, normal  speech and thoughts      Recent Labs    06/30/18 1202 05/29/19 1002  HGBA1C 7.4* 7.1*     Results for orders placed or performed in visit on 05/29/19  POCT HgB A1C  Result Value Ref Range   Hemoglobin A1C 7.1 (A) 4.0 - 5.6 %      Assessment & Plan:   Problem List Items Addressed This Visit    Type 2 diabetes mellitus with ESRD (end-stage renal disease) ([place]) - Primary   Relevant Orders   POCT HgB A1C (Completed)   Ambulatory referral to Chronic Care Management Services      Well-controlled DM with A1c 7.1 now improved from 7.4 No reported hypoglycemia Complications - ESRD on HD, DM retinopathy and cataracts, obesity, GERD  Plan:  1. Discussed adding new GLP1 agent therapy, as recommended by her PCP - Sample given Trulicity 0.75mg  weekly injection, x 2 pen = 2 weeks - advised to HOLD for now until we contact our clinical pharmacist with [facility] to discuss future plan with GLP1 agent cost/financial and safety with ESRD on HD - Will notify patient once we discuss further when  and if to start sample - CONTINUE Lantus insulin 10 units daily, Novolog mealtime 2-3 units wc up to 3 times daily if eats meal, may reduce novolog mealtime in future if stable on GLP1 2. Encourage improved lifestyle - low carb, low sugar diet, reduce portion size, continue improving regular exercise 3. Check CBG , bring log to next visit for review 4. Request copy of last DM Eye exam from [facility] 5. Follow-up 3 months DM A1c w/ PCP  Referral to [place] for DM med management, financial assistance  **UPDATE 05/29/19 after visit** - Discussed case with Harlow Asa RPH, reviewed GLP1 researched for ESRD patients on HD, and there is some limited data for Victoza, but may increase GI intolerance side effects and risk dehydration, this is the original recommendation from patient's PCP, we do not have samples available. Ultimately in speaking with Grayland Ormond - we decided mutually that patient should continue with current insulin management instead of adding GLP1, I will discontinue the GLP1 trulicity off her med list right now, and Grayland Ormond will work with her further on financial assistance with insulin and in future if she wants to discuss possible Victoza with PCP that is reasonable.   Orders Placed This Encounter  Procedures  . Ambulatory referral to Chronic Care Management Services    Referral Priority:   Routine    Referral Type:   Consultation    Referral Reason:   Care Coordination    Number of Visits Requested:   1  . POCT HgB A1C    Meds ordered this encounter  Medications  . DISCONTD: Dulaglutide (TRULICITY) 5.36 UY/4.0HK SOPN    Sig: Inject 0.75 mg into the skin once a week.    Dispense:  2 pen    Refill:  0     Follow up plan: Return in about 3 months (around 08/29/2019) for DM A1c.   Nobie Putnam, [facility] 05/29/2019, 10:00 AM

## 2019-05-29 NOTE — Patient Instructions (Addendum)
Thank you for coming to the office today.  New sample for Trulicity 0.75mg  weekly injection, 2 pens given in sample box - HOLD until we contact you back later next 1-2 days with instructions to start or to hold medicine.  Grayland Ormond from [facility] care management pharmacy team will call you this week with more information goal to get these medicines covered or financial assistance.  Stay tuned.  Recent Labs    06/30/18 1202 05/29/19 1002  HGBA1C 7.4* 7.1*    Please schedule a Follow-up Appointment to: Return in about 3 months (around 08/29/2019) for DM A1c.  If you have any other questions or concerns, please feel free to call the office or send a message through [place]. You may also schedule an earlier appointment if necessary.  Additionally, you may be receiving a survey about your experience at our office within a few days to 1 week by e-mail or mail. We value your feedback.  Nobie Putnam, DO [place]

## 2019-05-30 ENCOUNTER — Encounter: Payer: Self-pay | Admitting: Nephrology

## 2019-05-30 ENCOUNTER — Ambulatory Visit: Payer: Self-pay | Admitting: Pharmacist

## 2019-05-30 DIAGNOSIS — E1122 Type 2 diabetes mellitus with diabetic chronic kidney disease: Secondary | ICD-10-CM

## 2019-05-30 DIAGNOSIS — N186 End stage renal disease: Secondary | ICD-10-CM

## 2019-05-30 NOTE — Chronic Care Management (AMB) (Signed)
  Care Management Note   [name] is a 53 y.o. year old female who is a primary care patient of Mikey College, NP. The CM team was consulted for assistance with chronic disease management and care coordination, particularly for medication assistance.  Collaborate with Dr. Parks Ranger, covering for PCP, regarding diabetes management options for Ms. Eckmann. Unable to recommend the use of GLP-1 agonists for this patient due to her ESRD on HD. Currently there is limited clinical experience with using these agents in patients with ESRD.  I reached out to [name] by phone today.  Leave a HIPAA compliant message on the patient's voicemail.   Follow Up Plan: CM Pharmacist will reach out to the patient again over the next 3 days.    Harlow Asa, PharmD, [place] [employer] [phone]

## 2019-05-31 ENCOUNTER — Ambulatory Visit: Payer: Self-pay | Admitting: Pharmacist

## 2019-05-31 DIAGNOSIS — Z992 Dependence on renal dialysis: Secondary | ICD-10-CM | POA: Diagnosis not present

## 2019-05-31 DIAGNOSIS — D509 Iron deficiency anemia, unspecified: Secondary | ICD-10-CM | POA: Diagnosis not present

## 2019-05-31 DIAGNOSIS — N2581 Secondary hyperparathyroidism of renal origin: Secondary | ICD-10-CM | POA: Diagnosis not present

## 2019-05-31 DIAGNOSIS — N186 End stage renal disease: Secondary | ICD-10-CM | POA: Diagnosis not present

## 2019-05-31 DIAGNOSIS — D631 Anemia in chronic kidney disease: Secondary | ICD-10-CM | POA: Diagnosis not present

## 2019-05-31 NOTE — Chronic Care Management (AMB) (Signed)
  Care Management Note   [name] is a 53 y.o. year old female who is a primary care patient of Mikey College, NP. The CM team was consulted for assistance with chronic disease management and care coordination, particularly for medication assistance.  Collaborate with Dr. Parks Ranger, covering for PCP, regarding diabetes management options for [name]. Unable to recommend the use of GLP-1 agonists for this patient due to her ESRD on HD. Currently there is limited clinical experience with using these agents in patients with ESRD.  Receive a voicemail from [name] returning my call from yesterday.  Call patient back, but unable to reach patient via telephone today and leave another HIPAA compliant voicemail asking patient to return my call.    Follow Up Plan: CM Pharmacist will reach out to the patient again over the next 3 days.    Harlow Asa, PharmD, [place] [employer] [phone]

## 2019-06-01 ENCOUNTER — Ambulatory Visit: Payer: Medicare Other | Admitting: Pharmacist

## 2019-06-01 DIAGNOSIS — E1122 Type 2 diabetes mellitus with diabetic chronic kidney disease: Secondary | ICD-10-CM

## 2019-06-01 DIAGNOSIS — N186 End stage renal disease: Secondary | ICD-10-CM

## 2019-06-01 NOTE — Chronic Care Management (AMB) (Signed)
  Chronic Care Management   Note  06/01/2019 Name: [name] MRN: [id number] DOB: March 11, 1966  [name] is a 53 y.o. year old female who is a primary care patient of Mikey College, NP. The CM team was consulted for assistance with chronic disease management and care coordination, particularly for medication assistance.  Collaborate with Dr. Parks Ranger, covering for PCP, regarding diabetes management options for [name]. Unable to recommend the use of GLP-1 agonists for this patient due to her ESRD on HD. Currently there is limited clinical experience with using these agents in patients with ESRD.  Outreach telephone call to [name]. Discuss with [name] the current lack of data/clinical experience for use of GLP-agonists, such as Trulicity and Victoza, in patients with ESRD. Advise against starting Trulicity at this time. Inquire about patient's need for medication assistance with her current medications. Patient denies need for assistance with medication cost due to current Medicare and Medicaid coverage.  Patient denies further pharmacy questions/concerns or need for Care Management services at this time.  Follow up plan: The patient has been provided with contact information for the care management team and has been advised to call with any health related questions or concerns.    Harlow Asa, PharmD, [place] [employer] [phone]

## 2019-06-02 ENCOUNTER — Telehealth: Payer: Self-pay

## 2019-06-02 DIAGNOSIS — N186 End stage renal disease: Secondary | ICD-10-CM | POA: Diagnosis not present

## 2019-06-02 DIAGNOSIS — Z992 Dependence on renal dialysis: Secondary | ICD-10-CM | POA: Diagnosis not present

## 2019-06-02 DIAGNOSIS — N2581 Secondary hyperparathyroidism of renal origin: Secondary | ICD-10-CM | POA: Diagnosis not present

## 2019-06-02 DIAGNOSIS — D509 Iron deficiency anemia, unspecified: Secondary | ICD-10-CM | POA: Diagnosis not present

## 2019-06-02 DIAGNOSIS — D631 Anemia in chronic kidney disease: Secondary | ICD-10-CM | POA: Diagnosis not present

## 2019-06-05 DIAGNOSIS — N186 End stage renal disease: Secondary | ICD-10-CM | POA: Diagnosis not present

## 2019-06-05 DIAGNOSIS — N2581 Secondary hyperparathyroidism of renal origin: Secondary | ICD-10-CM | POA: Diagnosis not present

## 2019-06-05 DIAGNOSIS — D509 Iron deficiency anemia, unspecified: Secondary | ICD-10-CM | POA: Diagnosis not present

## 2019-06-05 DIAGNOSIS — Z992 Dependence on renal dialysis: Secondary | ICD-10-CM | POA: Diagnosis not present

## 2019-06-05 DIAGNOSIS — D631 Anemia in chronic kidney disease: Secondary | ICD-10-CM | POA: Diagnosis not present

## 2019-06-09 DIAGNOSIS — N186 End stage renal disease: Secondary | ICD-10-CM | POA: Diagnosis not present

## 2019-06-09 DIAGNOSIS — D509 Iron deficiency anemia, unspecified: Secondary | ICD-10-CM | POA: Diagnosis not present

## 2019-06-09 DIAGNOSIS — Z992 Dependence on renal dialysis: Secondary | ICD-10-CM | POA: Diagnosis not present

## 2019-06-09 DIAGNOSIS — N2581 Secondary hyperparathyroidism of renal origin: Secondary | ICD-10-CM | POA: Diagnosis not present

## 2019-06-09 DIAGNOSIS — D631 Anemia in chronic kidney disease: Secondary | ICD-10-CM | POA: Diagnosis not present

## 2019-06-12 DIAGNOSIS — D509 Iron deficiency anemia, unspecified: Secondary | ICD-10-CM | POA: Diagnosis not present

## 2019-06-12 DIAGNOSIS — Z992 Dependence on renal dialysis: Secondary | ICD-10-CM | POA: Diagnosis not present

## 2019-06-12 DIAGNOSIS — N2581 Secondary hyperparathyroidism of renal origin: Secondary | ICD-10-CM | POA: Diagnosis not present

## 2019-06-12 DIAGNOSIS — D631 Anemia in chronic kidney disease: Secondary | ICD-10-CM | POA: Diagnosis not present

## 2019-06-12 DIAGNOSIS — N186 End stage renal disease: Secondary | ICD-10-CM | POA: Diagnosis not present

## 2019-06-13 DIAGNOSIS — I120 Hypertensive chronic kidney disease with stage 5 chronic kidney disease or end stage renal disease: Secondary | ICD-10-CM | POA: Diagnosis not present

## 2019-06-13 DIAGNOSIS — Z794 Long term (current) use of insulin: Secondary | ICD-10-CM | POA: Diagnosis not present

## 2019-06-13 DIAGNOSIS — Z972 Presence of dental prosthetic device (complete) (partial): Secondary | ICD-10-CM | POA: Diagnosis not present

## 2019-06-13 DIAGNOSIS — Z992 Dependence on renal dialysis: Secondary | ICD-10-CM | POA: Diagnosis not present

## 2019-06-13 DIAGNOSIS — T82898A Other specified complication of vascular prosthetic devices, implants and grafts, initial encounter: Secondary | ICD-10-CM | POA: Diagnosis not present

## 2019-06-13 DIAGNOSIS — N186 End stage renal disease: Secondary | ICD-10-CM | POA: Diagnosis not present

## 2019-06-13 DIAGNOSIS — D631 Anemia in chronic kidney disease: Secondary | ICD-10-CM | POA: Diagnosis not present

## 2019-06-13 DIAGNOSIS — T82858A Stenosis of vascular prosthetic devices, implants and grafts, initial encounter: Secondary | ICD-10-CM | POA: Diagnosis not present

## 2019-06-13 DIAGNOSIS — I871 Compression of vein: Secondary | ICD-10-CM | POA: Diagnosis not present

## 2019-06-13 DIAGNOSIS — E1122 Type 2 diabetes mellitus with diabetic chronic kidney disease: Secondary | ICD-10-CM | POA: Diagnosis not present

## 2019-06-13 DIAGNOSIS — B1920 Unspecified viral hepatitis C without hepatic coma: Secondary | ICD-10-CM | POA: Diagnosis not present

## 2019-06-14 DIAGNOSIS — Z992 Dependence on renal dialysis: Secondary | ICD-10-CM | POA: Diagnosis not present

## 2019-06-14 DIAGNOSIS — D631 Anemia in chronic kidney disease: Secondary | ICD-10-CM | POA: Diagnosis not present

## 2019-06-14 DIAGNOSIS — N186 End stage renal disease: Secondary | ICD-10-CM | POA: Diagnosis not present

## 2019-06-14 DIAGNOSIS — N2581 Secondary hyperparathyroidism of renal origin: Secondary | ICD-10-CM | POA: Diagnosis not present

## 2019-06-14 DIAGNOSIS — D509 Iron deficiency anemia, unspecified: Secondary | ICD-10-CM | POA: Diagnosis not present

## 2019-06-16 DIAGNOSIS — N2581 Secondary hyperparathyroidism of renal origin: Secondary | ICD-10-CM | POA: Diagnosis not present

## 2019-06-16 DIAGNOSIS — Z992 Dependence on renal dialysis: Secondary | ICD-10-CM | POA: Diagnosis not present

## 2019-06-16 DIAGNOSIS — N186 End stage renal disease: Secondary | ICD-10-CM | POA: Diagnosis not present

## 2019-06-16 DIAGNOSIS — D631 Anemia in chronic kidney disease: Secondary | ICD-10-CM | POA: Diagnosis not present

## 2019-06-16 DIAGNOSIS — D509 Iron deficiency anemia, unspecified: Secondary | ICD-10-CM | POA: Diagnosis not present

## 2019-06-19 ENCOUNTER — Ambulatory Visit: Payer: Medicare Other | Admitting: Gastroenterology

## 2019-06-19 DIAGNOSIS — D509 Iron deficiency anemia, unspecified: Secondary | ICD-10-CM | POA: Diagnosis not present

## 2019-06-19 DIAGNOSIS — N2581 Secondary hyperparathyroidism of renal origin: Secondary | ICD-10-CM | POA: Diagnosis not present

## 2019-06-19 DIAGNOSIS — Z992 Dependence on renal dialysis: Secondary | ICD-10-CM | POA: Diagnosis not present

## 2019-06-19 DIAGNOSIS — D631 Anemia in chronic kidney disease: Secondary | ICD-10-CM | POA: Diagnosis not present

## 2019-06-19 DIAGNOSIS — N186 End stage renal disease: Secondary | ICD-10-CM | POA: Diagnosis not present

## 2019-06-20 DIAGNOSIS — Z992 Dependence on renal dialysis: Secondary | ICD-10-CM | POA: Diagnosis not present

## 2019-06-20 DIAGNOSIS — N186 End stage renal disease: Secondary | ICD-10-CM | POA: Diagnosis not present

## 2019-06-21 DIAGNOSIS — N186 End stage renal disease: Secondary | ICD-10-CM | POA: Diagnosis not present

## 2019-06-21 DIAGNOSIS — D631 Anemia in chronic kidney disease: Secondary | ICD-10-CM | POA: Diagnosis not present

## 2019-06-21 DIAGNOSIS — Z992 Dependence on renal dialysis: Secondary | ICD-10-CM | POA: Diagnosis not present

## 2019-06-21 DIAGNOSIS — D509 Iron deficiency anemia, unspecified: Secondary | ICD-10-CM | POA: Diagnosis not present

## 2019-06-21 DIAGNOSIS — N2581 Secondary hyperparathyroidism of renal origin: Secondary | ICD-10-CM | POA: Diagnosis not present

## 2019-06-23 DIAGNOSIS — D631 Anemia in chronic kidney disease: Secondary | ICD-10-CM | POA: Diagnosis not present

## 2019-06-23 DIAGNOSIS — N186 End stage renal disease: Secondary | ICD-10-CM | POA: Diagnosis not present

## 2019-06-23 DIAGNOSIS — Z992 Dependence on renal dialysis: Secondary | ICD-10-CM | POA: Diagnosis not present

## 2019-06-23 DIAGNOSIS — D509 Iron deficiency anemia, unspecified: Secondary | ICD-10-CM | POA: Diagnosis not present

## 2019-06-23 DIAGNOSIS — N2581 Secondary hyperparathyroidism of renal origin: Secondary | ICD-10-CM | POA: Diagnosis not present

## 2019-06-26 DIAGNOSIS — N2581 Secondary hyperparathyroidism of renal origin: Secondary | ICD-10-CM | POA: Diagnosis not present

## 2019-06-26 DIAGNOSIS — D631 Anemia in chronic kidney disease: Secondary | ICD-10-CM | POA: Diagnosis not present

## 2019-06-26 DIAGNOSIS — E113512 Type 2 diabetes mellitus with proliferative diabetic retinopathy with macular edema, left eye: Secondary | ICD-10-CM | POA: Diagnosis not present

## 2019-06-26 DIAGNOSIS — Z992 Dependence on renal dialysis: Secondary | ICD-10-CM | POA: Diagnosis not present

## 2019-06-26 DIAGNOSIS — D509 Iron deficiency anemia, unspecified: Secondary | ICD-10-CM | POA: Diagnosis not present

## 2019-06-26 DIAGNOSIS — N186 End stage renal disease: Secondary | ICD-10-CM | POA: Diagnosis not present

## 2019-06-26 DIAGNOSIS — H4312 Vitreous hemorrhage, left eye: Secondary | ICD-10-CM | POA: Diagnosis not present

## 2019-06-27 ENCOUNTER — Ambulatory Visit
Payer: Medicare Other | Attending: Student in an Organized Health Care Education/Training Program | Admitting: Student in an Organized Health Care Education/Training Program

## 2019-06-27 ENCOUNTER — Encounter: Payer: Self-pay | Admitting: Student in an Organized Health Care Education/Training Program

## 2019-06-27 ENCOUNTER — Other Ambulatory Visit: Payer: Self-pay

## 2019-06-27 DIAGNOSIS — M5412 Radiculopathy, cervical region: Secondary | ICD-10-CM

## 2019-06-27 DIAGNOSIS — M542 Cervicalgia: Secondary | ICD-10-CM | POA: Diagnosis not present

## 2019-06-27 DIAGNOSIS — G894 Chronic pain syndrome: Secondary | ICD-10-CM

## 2019-06-27 DIAGNOSIS — M47816 Spondylosis without myelopathy or radiculopathy, lumbar region: Secondary | ICD-10-CM | POA: Diagnosis not present

## 2019-06-27 DIAGNOSIS — M5030 Other cervical disc degeneration, unspecified cervical region: Secondary | ICD-10-CM

## 2019-06-27 DIAGNOSIS — M47812 Spondylosis without myelopathy or radiculopathy, cervical region: Secondary | ICD-10-CM | POA: Diagnosis not present

## 2019-06-27 DIAGNOSIS — E1142 Type 2 diabetes mellitus with diabetic polyneuropathy: Secondary | ICD-10-CM

## 2019-06-27 MED ORDER — OXYCODONE HCL 5 MG PO TABS: 5.0000 mg | ORAL_TABLET | Freq: Two times a day (BID) | ORAL | 0 refills | Status: DC | PRN

## 2019-06-27 NOTE — Progress Notes (Signed)
Pain Management Virtual Encounter Note - Virtual Visit via Telephone Telehealth (real-time audio visits between healthcare provider and patient).   Patient's Phone No. & Preferred Pharmacy:  [phone] (home); [phone] (mobile); (Preferred) [phone] [email or web address]  [place], [name] [address] [place] [address] Phone: [phone] Fax: [phone]    Pre-screening note:  Our staff contacted [name] and offered her an "in person", "face-to-face" appointment versus a telephone encounter. She indicated preferring the telephone encounter, at this time.   Reason for Virtual Visit: COVID-19*  Social distancing based on CDC and AMA recommendations.   I contacted [name] on 06/27/2019 via telephone.      I clearly identified myself as Olivia Santa, MD. I verified that I was speaking with the correct person using two identifiers (Name: [name], and date of birth: 09-17-1966).  Advanced Informed Consent I sought verbal advanced consent from [name] for virtual visit interactions. I informed [name] of possible security and privacy concerns, risks, and limitations associated with providing "not-in-person" medical evaluation and management services. I also informed [name] of the availability of "in-person" appointments. Finally, I informed her that there would be a charge for the virtual visit and that she could be  personally, fully or partially, financially responsible for it. [name] expressed understanding and agreed to proceed.   Historic Elements   [name] is a 53 y.o. year old, female patient evaluated today after her last encounter by our practice on 06/02/2019. [name]  has a past medical history of Allergy, Anemia, Anxiety, Arthritis, Ataxia, COPD (chronic obstructive pulmonary disease) ([name]), Depression, Diabetes mellitus with complication ([name]), ESRD on hemodialysis ([place]), Fistula, Hepatitis  (2003), Hiatal hernia, Hypercholesterolemia, Hypertension, Migraine, Neuropathy, diabetic ([name]), Peripheral vascular disease ([name]), Pneumonia (2015), Psoriasis, Tobacco dependence, and Wears dentures. She also  has a past surgical history that includes Tonsillectomy; rt. tubal and ovary removed; Cholecystectomy; Cardiac catheterization (N/A, 07/25/2015); Cardiac catheterization (Left, 07/25/2015); Cardiac catheterization (Left, 10/07/2015); Cardiac catheterization (N/A, 10/07/2015); Cardiac catheterization (10/07/2015); Cardiac catheterization (N/A, 12/17/2015); Incision and drainage abscess (N/A, 07/16/2016); cyst removed  from left hand (Left, 1989); AV fistula placement (Left, 11/2014); Cardiac catheterization (N/A, 01/11/2017); Cardiac catheterization (N/A, 01/11/2017); DIALYSIS/PERMA CATHETER INSERTION (N/A, 05/20/2017); Cesarean section; Tubal ligation; Colonoscopy (N/A, 09/22/2017); Esophagogastroduodenoscopy (N/A, 09/22/2017); polypectomy (09/22/2017); A/V SHUNT INTERVENTION (N/A, 12/16/2017); and STENT PLACEMENT VASCULAR ([facility] HX) (Left, 03/2018). [name] has a current medication list which includes the following prescription(s): adalimumab, amitriptyline, atorvastatin, calcium acetate, clobetasol, clobetasol ointment, diltiazem, glucose blood, insulin aspart, insulin glargine, insulin syringe .5cc/29g, ketoconazole, levocetirizine, loperamide, midodrine, omeprazole, ondansetron, oxycodone, oxycodone, symbicort, torsemide, and ventolin hfa. She  reports that she has been smoking cigarettes. She has a 5.00 pack-year smoking history. She uses smokeless tobacco. She reports that she does not drink alcohol or use drugs. [name] is allergic to cinnamon; garlic; onion; tylenol [acetaminophen]; ciprofloxacin; and prednisone.   HPI  Today, she is being contacted for medication management.  No change in medical history since last visit.  Patient's pain is at baseline.  Patient continues multimodal pain regimen as  prescribed.  States that it provides pain relief and improvement in functional status.   Pharmacotherapy Assessment  Analgesic:  05/29/2019  3   04/11/2019  Oxycodone Hcl 5 MG Tablet  60.00 30 Bi Lat   [id number]   [place] ([id number])   0  15.00 MME  Medicare   [place]  Monitoring: Pharmacotherapy: No side-effects or adverse reactions reported.  PMP: PDMP reviewed during this encounter.       Compliance: No problems identified. Effectiveness: Clinically acceptable. Plan: Refer to "POC".  Pertinent Labs   SAFETY SCREENING Profile Lab Results  Component Value Date   SARSCOV2NAA NEGATIVE 04/30/2019   MRSAPCR NEGATIVE 04/30/2019   HCVAB >11.0 (H) 12/16/2016   HIV Non Reactive 04/30/2019   PREGTESTUR NEGATIVE 03/31/2013   Renal Function Lab Results  Component Value Date   BUN 52 (H) 05/01/2019   CREATININE 7.50 (H) 05/01/2019   BCR 10 12/27/2017   GFRAA 6 (L) 05/01/2019   GFRNONAA 6 (L) 05/01/2019   Hepatic Function Lab Results  Component Value Date   AST 40 04/30/2019   ALT 33 04/30/2019   ALBUMIN 3.0 (L) 05/01/2019   UDS No results found for: SUMMARY Note: Above Lab results reviewed.  Recent imaging  DG Chest Port 1 View CLINICAL DATA:  53 year old female with fever  EXAM: PORTABLE CHEST 1 VIEW  COMPARISON:  Chest radiograph dated 10/13/2017  FINDINGS: There is mild cardiomegaly. No focal consolidation, pleural effusion, or pneumothorax. Left subclavian vascular stent. No acute osseous pathology.  IMPRESSION: No active disease.  Electronically Signed   By: Anner Crete M.D.   On: 04/30/2019 00:24  Assessment  The primary encounter diagnosis was Cervical radiculopathy. Diagnoses of Diabetic polyneuropathy associated with type 2 diabetes mellitus ([name]), DDD (degenerative disc disease), cervical, Cervicalgia, Spondylosis of cervical region without myelopathy or radiculopathy, Lumbar spondylosis, and Chronic pain syndrome were also pertinent to this  visit.  Plan of Care  I have changed [name]. [name] oxyCODONE. I am also having her start on oxyCODONE. Additionally, I am having her maintain her clobetasol ointment, calcium acetate, loperamide, glucose blood, ondansetron, midodrine, torsemide, clobetasol, Adalimumab (HUMIRA PEN Stamps), atorvastatin, diltiazem, amitriptyline, ketoconazole, INSULIN SYRINGE .5CC/29G, insulin glargine, insulin aspart, levocetirizine, omeprazole, Symbicort, and Ventolin HFA.  Pharmacotherapy (Medications Ordered): Meds ordered this encounter  Medications  . oxyCODONE (OXY IR/ROXICODONE) 5 MG immediate release tablet    Sig: Take 1 tablet (5 mg total) by mouth 2 (two) times daily as needed for severe pain.    Dispense:  60 tablet    Refill:  0  . oxyCODONE (OXY IR/ROXICODONE) 5 MG immediate release tablet    Sig: Take 1 tablet (5 mg total) by mouth 2 (two) times daily as needed for severe pain.    Dispense:  60 tablet    Refill:  0   Follow-up plan:   Return in about 11 weeks (around 09/12/2019) for Medication Management.    Recent Visits Date Type Provider Dept  04/11/19 Telemedicine Olivia Santa, MD [facility]  Showing recent visits within past 90 days and meeting all other requirements   Today's Visits Date Type Provider Dept  06/27/19 Office Visit Olivia Santa, MD [facility]  Showing today's visits and meeting all other requirements   Future Appointments No visits were found meeting these conditions.  Showing future appointments within next 90 days and meeting all other requirements   I discussed the assessment and treatment plan with the patient. The patient was provided an opportunity to ask questions and all were answered. The patient agreed with the plan and demonstrated an understanding of the instructions.  Patient advised to call back or seek an in-person evaluation if the symptoms or condition worsens.  Total duration of non-face-to-face encounter: 25  minutes.  Note by: Olivia Santa, MD Date: 06/27/2019; Time: 9:22 AM  Note: This dictation was prepared with Dragon dictation. Any transcriptional errors that may result from this process are unintentional.  Disclaimer:  * Given the special circumstances of the COVID-19 pandemic, the federal government has announced that the Office for Civil Rights (OCR) will exercise its enforcement discretion and will not impose penalties on physicians using telehealth in the event of noncompliance with regulatory requirements under the [place] (HIPAA) in connection with the good faith provision of telehealth during the EXMDY-70 national public health emergency. ([place])

## 2019-06-28 ENCOUNTER — Other Ambulatory Visit: Payer: Self-pay | Admitting: Nurse Practitioner

## 2019-06-28 DIAGNOSIS — D631 Anemia in chronic kidney disease: Secondary | ICD-10-CM | POA: Diagnosis not present

## 2019-06-28 DIAGNOSIS — N2581 Secondary hyperparathyroidism of renal origin: Secondary | ICD-10-CM | POA: Diagnosis not present

## 2019-06-28 DIAGNOSIS — E1165 Type 2 diabetes mellitus with hyperglycemia: Secondary | ICD-10-CM

## 2019-06-28 DIAGNOSIS — D509 Iron deficiency anemia, unspecified: Secondary | ICD-10-CM | POA: Diagnosis not present

## 2019-06-28 DIAGNOSIS — N186 End stage renal disease: Secondary | ICD-10-CM | POA: Diagnosis not present

## 2019-06-28 DIAGNOSIS — IMO0002 Reserved for concepts with insufficient information to code with codable children: Secondary | ICD-10-CM

## 2019-06-28 DIAGNOSIS — Z992 Dependence on renal dialysis: Secondary | ICD-10-CM | POA: Diagnosis not present

## 2019-06-30 DIAGNOSIS — N186 End stage renal disease: Secondary | ICD-10-CM | POA: Diagnosis not present

## 2019-06-30 DIAGNOSIS — D631 Anemia in chronic kidney disease: Secondary | ICD-10-CM | POA: Diagnosis not present

## 2019-06-30 DIAGNOSIS — Z992 Dependence on renal dialysis: Secondary | ICD-10-CM | POA: Diagnosis not present

## 2019-06-30 DIAGNOSIS — D509 Iron deficiency anemia, unspecified: Secondary | ICD-10-CM | POA: Diagnosis not present

## 2019-06-30 DIAGNOSIS — N2581 Secondary hyperparathyroidism of renal origin: Secondary | ICD-10-CM | POA: Diagnosis not present

## 2019-07-03 DIAGNOSIS — N186 End stage renal disease: Secondary | ICD-10-CM | POA: Diagnosis not present

## 2019-07-03 DIAGNOSIS — N2581 Secondary hyperparathyroidism of renal origin: Secondary | ICD-10-CM | POA: Diagnosis not present

## 2019-07-03 DIAGNOSIS — Z992 Dependence on renal dialysis: Secondary | ICD-10-CM | POA: Diagnosis not present

## 2019-07-03 DIAGNOSIS — D509 Iron deficiency anemia, unspecified: Secondary | ICD-10-CM | POA: Diagnosis not present

## 2019-07-03 DIAGNOSIS — D631 Anemia in chronic kidney disease: Secondary | ICD-10-CM | POA: Diagnosis not present

## 2019-07-05 DIAGNOSIS — D509 Iron deficiency anemia, unspecified: Secondary | ICD-10-CM | POA: Diagnosis not present

## 2019-07-05 DIAGNOSIS — Z992 Dependence on renal dialysis: Secondary | ICD-10-CM | POA: Diagnosis not present

## 2019-07-05 DIAGNOSIS — N2581 Secondary hyperparathyroidism of renal origin: Secondary | ICD-10-CM | POA: Diagnosis not present

## 2019-07-05 DIAGNOSIS — N186 End stage renal disease: Secondary | ICD-10-CM | POA: Diagnosis not present

## 2019-07-05 DIAGNOSIS — D631 Anemia in chronic kidney disease: Secondary | ICD-10-CM | POA: Diagnosis not present

## 2019-07-07 DIAGNOSIS — N186 End stage renal disease: Secondary | ICD-10-CM | POA: Diagnosis not present

## 2019-07-07 DIAGNOSIS — D631 Anemia in chronic kidney disease: Secondary | ICD-10-CM | POA: Diagnosis not present

## 2019-07-07 DIAGNOSIS — D509 Iron deficiency anemia, unspecified: Secondary | ICD-10-CM | POA: Diagnosis not present

## 2019-07-07 DIAGNOSIS — Z992 Dependence on renal dialysis: Secondary | ICD-10-CM | POA: Diagnosis not present

## 2019-07-07 DIAGNOSIS — N2581 Secondary hyperparathyroidism of renal origin: Secondary | ICD-10-CM | POA: Diagnosis not present

## 2019-07-08 ENCOUNTER — Other Ambulatory Visit: Payer: Self-pay | Admitting: Nephrology

## 2019-07-08 DIAGNOSIS — R310 Gross hematuria: Secondary | ICD-10-CM

## 2019-07-08 DIAGNOSIS — N186 End stage renal disease: Secondary | ICD-10-CM

## 2019-07-10 DIAGNOSIS — Z992 Dependence on renal dialysis: Secondary | ICD-10-CM | POA: Diagnosis not present

## 2019-07-10 DIAGNOSIS — D509 Iron deficiency anemia, unspecified: Secondary | ICD-10-CM | POA: Diagnosis not present

## 2019-07-10 DIAGNOSIS — D631 Anemia in chronic kidney disease: Secondary | ICD-10-CM | POA: Diagnosis not present

## 2019-07-10 DIAGNOSIS — N2581 Secondary hyperparathyroidism of renal origin: Secondary | ICD-10-CM | POA: Diagnosis not present

## 2019-07-10 DIAGNOSIS — N186 End stage renal disease: Secondary | ICD-10-CM | POA: Diagnosis not present

## 2019-07-11 ENCOUNTER — Ambulatory Visit (INDEPENDENT_AMBULATORY_CARE_PROVIDER_SITE_OTHER): Payer: Medicare Other | Admitting: Gastroenterology

## 2019-07-11 ENCOUNTER — Encounter: Payer: Self-pay | Admitting: Gastroenterology

## 2019-07-11 ENCOUNTER — Other Ambulatory Visit: Payer: Self-pay

## 2019-07-11 VITALS — BP 135/78 | HR 84 | Temp 98.5°F | Ht 67.0 in | Wt 221.8 lb

## 2019-07-11 DIAGNOSIS — B182 Chronic viral hepatitis C: Secondary | ICD-10-CM

## 2019-07-11 DIAGNOSIS — R1310 Dysphagia, unspecified: Secondary | ICD-10-CM

## 2019-07-11 NOTE — Patient Instructions (Addendum)
You are scheduled for a barium swallow at [facility] on Monday, August 10th at 9:00am. Please arrive at 8:45am at the medical mall registration desk.  You cannot have anything to eat or drink 3 hours prior.   If you need to reschedule this appointment for any reason, please contact central scheduling at [phone].

## 2019-07-11 NOTE — Progress Notes (Signed)
Primary Care Physician: Mikey College, NP  Primary Gastroenterologist:  Dr. [name]  Chief Complaint  Patient presents with  . Hepatitis C follow up    HPI: [name] is a 53 y.o. female here for follow-up with a history of diarrhea and hepatitis C.  The patient has been treated for hepatitis C already.  The patient's biopsies of her colon showed mild focal active colitis.  It was thought that this may be due to the prep or early IBD.  The patient has also had problems with constipation and has been treated with a bowel prep.  The patient has been seen by Dr. Vicente Males and Dr. Marius Ditch in the past.  Prior to seeing [place] the patient was seen at [facility] by hepatology there. The patient has been treated for her hepatitis C.  She states that she is not sure that she completely resolved her hepatitis C.  The patient has also had intermittent diarrhea constipation.  She is concerned because she was told that she had F3 fibrosis in the past. The patient also reports that she has been having trouble with swallowing at times.  She feels like things are not getting stuck up in the top of her esophagus in her throat.  There is no report of any unexplained weight loss fevers chills nausea or vomiting.  Current Outpatient Medications  Medication Sig Dispense Refill  . Adalimumab (HUMIRA PEN Orient) Inject into the skin every 14 (fourteen) days.    Marland Kitchen amitriptyline (ELAVIL) 10 MG tablet Take 10 mg by mouth at bedtime.    Marland Kitchen atorvastatin (LIPITOR) 20 MG tablet TAKE ONE TABLET BY MOUTH ONCE DAILY 90 tablet 1  . calcium acetate (PHOSLO) 667 MG capsule Take 1,334 mg by mouth 3 (three) times daily with meals.     . clobetasol (OLUX) 0.05 % topical foam Apply topically 2 (two) times daily.    . clobetasol ointment (TEMOVATE) 9.21 % Apply 1 application topically daily as needed (for psorasis).     Marland Kitchen diltiazem (CARDIZEM) 60 MG tablet Take 1 tablet (60 mg total) by mouth 3 (three) times daily. 90 tablet 11  .  glucose blood (TRUE METRIX BLOOD GLUCOSE TEST) test strip Use as instructed 100 each 12  . insulin aspart (NOVOLOG) 100 UNIT/ML injection Inject 1-2 Units into the skin 3 (three) times daily before meals. 10 mL 3  . insulin glargine (LANTUS) 100 UNIT/ML injection Inject 0.1 mLs (10 Units total) into the skin daily. 10 mL 3  . INSULIN SYRINGE .5CC/29G (TRUEPLUS INSULIN SYRINGE) 29G X 1/2" 0.5 ML MISC Use 4 times a day as directed per provider. 120 each 4  . ketoconazole (NIZORAL) 2 % cream APPLY TOPICALLY ONTO THE SKIN TWICE A DAY AS NEEDED 15 g 5  . levocetirizine (XYZAL) 5 MG tablet TAKE ONE TABLET BY MOUTH EVERY EVENING 30 tablet 5  . loperamide (IMODIUM A-D) 2 MG tablet Take 2 mg by mouth 3 (three) times daily as needed.     . midodrine (PROAMATINE) 5 MG tablet TK 1 T PO  DURING DIALYSIS TREATMENT FOR LOW BP  5  . omeprazole (PRILOSEC) 40 MG capsule Take 40 mg by mouth daily.    . ondansetron (ZOFRAN) 8 MG tablet Take 8 mg by mouth every 8 (eight) hours as needed.     Derrill Memo ON 07/28/2019] oxyCODONE (OXY IR/ROXICODONE) 5 MG immediate release tablet Take 1 tablet (5 mg total) by mouth 2 (two) times daily as needed for  severe pain. 60 tablet 0  . [START ON 08/27/2019] oxyCODONE (OXY IR/ROXICODONE) 5 MG immediate release tablet Take 1 tablet (5 mg total) by mouth 2 (two) times daily as needed for severe pain. 60 tablet 0  . SYMBICORT 160-4.5 MCG/ACT inhaler Inhale 2 puffs into the lungs 2 (two) times daily.    Marland Kitchen torsemide (DEMADEX) 100 MG tablet Take 100 mg by mouth daily.    . VENTOLIN HFA 108 (90 Base) MCG/ACT inhaler INHALE 2 PUFFS BY MOUTH 4 TIMES A DAY    . amitriptyline (ELAVIL) 25 MG tablet Take 25 mg by mouth at bedtime.     No current facility-administered medications for this visit.     Allergies as of 07/11/2019 - Review Complete 07/11/2019  Allergen Reaction Noted  . Cinnamon Anaphylaxis 10/11/2015  . Garlic Anaphylaxis and Hives 11/28/2014  . Onion Hives and Swelling 11/28/2014  .  Tylenol [acetaminophen] Anaphylaxis 07/25/2015  . Ciprofloxacin Diarrhea 11/05/2016  . Prednisone Other (See Comments) 01/05/2017    ROS:  General: Negative for anorexia, weight loss, fever, chills, fatigue, weakness. ENT: Negative for hoarseness, difficulty swallowing , nasal congestion. CV: Negative for chest pain, angina, palpitations, dyspnea on exertion, peripheral edema.  Respiratory: Negative for dyspnea at rest, dyspnea on exertion, cough, sputum, wheezing.  GI: See history of present illness. GU:  Negative for dysuria, hematuria, urinary incontinence, urinary frequency, nocturnal urination.  Endo: Negative for unusual weight change.    Physical Examination:   BP 135/78   Pulse 84   Temp 98.5 F (36.9 C) (Oral)   Ht 5\' 7"  (1.702 m)   Wt 221 lb 12.8 oz (100.6 kg)   BMI 34.74 kg/m   General: Well-nourished, well-developed in no acute distress.  Eyes: No icterus. Conjunctivae pink. Mouth: Oropharyngeal mucosa moist and pink , no lesions erythema or exudate. Lungs: Clear to auscultation bilaterally. Non-labored. Heart: Regular rate and rhythm, no murmurs rubs or gallops.  Abdomen: Bowel sounds are normal, nontender, nondistended, no hepatosplenomegaly or masses, no abdominal bruits or hernia , no rebound or guarding.   Extremities: No lower extremity edema. No clubbing or deformities.  Dialysis fistula on left arm Neuro: Alert and oriented x 3.  Grossly intact. Skin: Warm and dry, no jaundice.   Psych: Alert and cooperative, normal mood and affect.  Labs:    Imaging Studies: No results found.  Assessment and Plan:   [name] is a 53 y.o. y/o female who comes in with a history of hypertension alternating diarrhea with constipation.  The patient had been tried on Linzess and states that it gave her profuse diarrhea.  The patient has been told to increase fiber in her diet with Citrucel.  The patient will also have her blood sent off for hepatitis C viral load and  she will also have liver function test sent off.  Because of her dysphasia the patient will also be set up for a barium swallow.  The patient is concerned about her fibrosis and will have a fiber sure test sent off.  The patient has been explained the plan with it.    [name], MD. Marval Regal   Note: This dictation was prepared with Dragon dictation along with smaller phrase technology. Any transcriptional errors that result from this process are unintentional.

## 2019-07-12 DIAGNOSIS — N186 End stage renal disease: Secondary | ICD-10-CM | POA: Diagnosis not present

## 2019-07-12 DIAGNOSIS — Z992 Dependence on renal dialysis: Secondary | ICD-10-CM | POA: Diagnosis not present

## 2019-07-12 DIAGNOSIS — N2581 Secondary hyperparathyroidism of renal origin: Secondary | ICD-10-CM | POA: Diagnosis not present

## 2019-07-12 DIAGNOSIS — D631 Anemia in chronic kidney disease: Secondary | ICD-10-CM | POA: Diagnosis not present

## 2019-07-12 DIAGNOSIS — D509 Iron deficiency anemia, unspecified: Secondary | ICD-10-CM | POA: Diagnosis not present

## 2019-07-13 DIAGNOSIS — H2513 Age-related nuclear cataract, bilateral: Secondary | ICD-10-CM | POA: Diagnosis not present

## 2019-07-14 ENCOUNTER — Other Ambulatory Visit: Payer: Self-pay

## 2019-07-14 ENCOUNTER — Ambulatory Visit
Admission: RE | Admit: 2019-07-14 | Discharge: 2019-07-14 | Disposition: A | Discharge status: Home / Self Care | Payer: Medicare Other | Source: Ambulatory Visit | Attending: Nephrology | Admitting: Nephrology

## 2019-07-14 DIAGNOSIS — Z992 Dependence on renal dialysis: Secondary | ICD-10-CM | POA: Diagnosis not present

## 2019-07-14 DIAGNOSIS — R310 Gross hematuria: Secondary | ICD-10-CM | POA: Diagnosis not present

## 2019-07-14 DIAGNOSIS — N186 End stage renal disease: Secondary | ICD-10-CM | POA: Diagnosis not present

## 2019-07-14 DIAGNOSIS — I700 Atherosclerosis of aorta: Secondary | ICD-10-CM | POA: Diagnosis not present

## 2019-07-14 DIAGNOSIS — D509 Iron deficiency anemia, unspecified: Secondary | ICD-10-CM | POA: Diagnosis not present

## 2019-07-14 DIAGNOSIS — D631 Anemia in chronic kidney disease: Secondary | ICD-10-CM | POA: Diagnosis not present

## 2019-07-14 DIAGNOSIS — N2581 Secondary hyperparathyroidism of renal origin: Secondary | ICD-10-CM | POA: Diagnosis not present

## 2019-07-14 HISTORY — DX: Disorder of kidney and ureter, unspecified: N28.9

## 2019-07-14 LAB — HCV FIBROSURE
ALPHA 2-MACROGLOBULINS, QN: 375 mg/dL — ABNORMAL HIGH (ref 110–276)
ALT (SGPT) P5P: 28 IU/L (ref 0–40)
Apolipoprotein A-1: 132 mg/dL (ref 116–209)
Bilirubin, Total: 0.4 mg/dL (ref 0.0–1.2)
Fibrosis Score: 0.52 — ABNORMAL HIGH (ref 0.00–0.21)
GGT: 28 IU/L (ref 0–60)
Haptoglobin: 78 mg/dL (ref 33–346)
Necroinflammat Activity Score: 0.18 — ABNORMAL HIGH (ref 0.00–0.17)

## 2019-07-14 LAB — HEPATIC FUNCTION PANEL
ALT: 24 IU/L (ref 0–32)
AST: 28 IU/L (ref 0–40)
Albumin: 4.3 g/dL (ref 3.8–4.9)
Alkaline Phosphatase: 75 IU/L (ref 39–117)
Bilirubin Total: 0.3 mg/dL (ref 0.0–1.2)
Bilirubin, Direct: 0.11 mg/dL (ref 0.00–0.40)
Total Protein: 7.9 g/dL (ref 6.0–8.5)

## 2019-07-14 LAB — HCV RNA QUANT: Hepatitis C Quantitation: NOT DETECTED IU/mL

## 2019-07-14 MED ORDER — IOHEXOL 300 MG/ML  SOLN
125.0000 mL | Freq: Once | INTRAMUSCULAR | Status: AC | PRN
  Administered 2019-07-14: 125 mL via INTRAVENOUS

## 2019-07-17 ENCOUNTER — Ambulatory Visit: Payer: Medicare Other

## 2019-07-17 DIAGNOSIS — D509 Iron deficiency anemia, unspecified: Secondary | ICD-10-CM | POA: Diagnosis not present

## 2019-07-17 DIAGNOSIS — E119 Type 2 diabetes mellitus without complications: Secondary | ICD-10-CM | POA: Diagnosis not present

## 2019-07-17 DIAGNOSIS — N2581 Secondary hyperparathyroidism of renal origin: Secondary | ICD-10-CM | POA: Diagnosis not present

## 2019-07-17 DIAGNOSIS — N186 End stage renal disease: Secondary | ICD-10-CM | POA: Diagnosis not present

## 2019-07-17 DIAGNOSIS — D631 Anemia in chronic kidney disease: Secondary | ICD-10-CM | POA: Diagnosis not present

## 2019-07-17 DIAGNOSIS — Z794 Long term (current) use of insulin: Secondary | ICD-10-CM | POA: Diagnosis not present

## 2019-07-17 DIAGNOSIS — Z1159 Encounter for screening for other viral diseases: Secondary | ICD-10-CM | POA: Diagnosis not present

## 2019-07-17 DIAGNOSIS — Z992 Dependence on renal dialysis: Secondary | ICD-10-CM | POA: Diagnosis not present

## 2019-07-18 ENCOUNTER — Telehealth: Payer: Self-pay

## 2019-07-18 NOTE — Telephone Encounter (Signed)
-----   Message from [name], MD sent at 07/15/2019  6:26 PM EDT ----- Let the patient know the HCV was negative and the fibrosis score has gone down to F2 from her previous F3. Her liver enzymes were also normal.

## 2019-07-18 NOTE — Telephone Encounter (Signed)
Pt notified of lab results

## 2019-07-19 DIAGNOSIS — Z823 Family history of stroke: Secondary | ICD-10-CM | POA: Diagnosis not present

## 2019-07-19 DIAGNOSIS — E1122 Type 2 diabetes mellitus with diabetic chronic kidney disease: Secondary | ICD-10-CM | POA: Diagnosis not present

## 2019-07-19 DIAGNOSIS — I509 Heart failure, unspecified: Secondary | ICD-10-CM | POA: Diagnosis not present

## 2019-07-19 DIAGNOSIS — H4312 Vitreous hemorrhage, left eye: Secondary | ICD-10-CM | POA: Diagnosis not present

## 2019-07-19 DIAGNOSIS — E113592 Type 2 diabetes mellitus with proliferative diabetic retinopathy without macular edema, left eye: Secondary | ICD-10-CM | POA: Diagnosis not present

## 2019-07-19 DIAGNOSIS — N189 Chronic kidney disease, unspecified: Secondary | ICD-10-CM | POA: Diagnosis not present

## 2019-07-19 DIAGNOSIS — H269 Unspecified cataract: Secondary | ICD-10-CM | POA: Diagnosis not present

## 2019-07-19 DIAGNOSIS — Z9049 Acquired absence of other specified parts of digestive tract: Secondary | ICD-10-CM | POA: Diagnosis not present

## 2019-07-19 DIAGNOSIS — F17210 Nicotine dependence, cigarettes, uncomplicated: Secondary | ICD-10-CM | POA: Diagnosis not present

## 2019-07-19 DIAGNOSIS — Z886 Allergy status to analgesic agent status: Secondary | ICD-10-CM | POA: Diagnosis not present

## 2019-07-19 DIAGNOSIS — H2589 Other age-related cataract: Secondary | ICD-10-CM | POA: Diagnosis not present

## 2019-07-19 DIAGNOSIS — I130 Hypertensive heart and chronic kidney disease with heart failure and stage 1 through stage 4 chronic kidney disease, or unspecified chronic kidney disease: Secondary | ICD-10-CM | POA: Diagnosis not present

## 2019-07-20 DIAGNOSIS — H4312 Vitreous hemorrhage, left eye: Secondary | ICD-10-CM | POA: Diagnosis not present

## 2019-07-20 DIAGNOSIS — D509 Iron deficiency anemia, unspecified: Secondary | ICD-10-CM | POA: Diagnosis not present

## 2019-07-20 DIAGNOSIS — N2581 Secondary hyperparathyroidism of renal origin: Secondary | ICD-10-CM | POA: Diagnosis not present

## 2019-07-20 DIAGNOSIS — Z992 Dependence on renal dialysis: Secondary | ICD-10-CM | POA: Diagnosis not present

## 2019-07-20 DIAGNOSIS — N186 End stage renal disease: Secondary | ICD-10-CM | POA: Diagnosis not present

## 2019-07-20 DIAGNOSIS — D631 Anemia in chronic kidney disease: Secondary | ICD-10-CM | POA: Diagnosis not present

## 2019-07-21 DIAGNOSIS — D509 Iron deficiency anemia, unspecified: Secondary | ICD-10-CM | POA: Diagnosis not present

## 2019-07-21 DIAGNOSIS — N2581 Secondary hyperparathyroidism of renal origin: Secondary | ICD-10-CM | POA: Diagnosis not present

## 2019-07-21 DIAGNOSIS — Z992 Dependence on renal dialysis: Secondary | ICD-10-CM | POA: Diagnosis not present

## 2019-07-21 DIAGNOSIS — N186 End stage renal disease: Secondary | ICD-10-CM | POA: Diagnosis not present

## 2019-07-21 DIAGNOSIS — D631 Anemia in chronic kidney disease: Secondary | ICD-10-CM | POA: Diagnosis not present

## 2019-07-24 DIAGNOSIS — N2581 Secondary hyperparathyroidism of renal origin: Secondary | ICD-10-CM | POA: Diagnosis not present

## 2019-07-24 DIAGNOSIS — N186 End stage renal disease: Secondary | ICD-10-CM | POA: Diagnosis not present

## 2019-07-24 DIAGNOSIS — D509 Iron deficiency anemia, unspecified: Secondary | ICD-10-CM | POA: Diagnosis not present

## 2019-07-24 DIAGNOSIS — Z992 Dependence on renal dialysis: Secondary | ICD-10-CM | POA: Diagnosis not present

## 2019-07-25 ENCOUNTER — Other Ambulatory Visit: Payer: Self-pay | Admitting: Nurse Practitioner

## 2019-07-25 DIAGNOSIS — IMO0002 Reserved for concepts with insufficient information to code with codable children: Secondary | ICD-10-CM

## 2019-07-25 DIAGNOSIS — E1165 Type 2 diabetes mellitus with hyperglycemia: Secondary | ICD-10-CM

## 2019-07-25 MED ORDER — INSULIN GLARGINE 100 UNIT/ML ~~LOC~~ SOLN: 10.0000 [IU] | Freq: Every day | SUBCUTANEOUS | 2 refills | Status: DC

## 2019-07-25 MED ORDER — INSULIN ASPART 100 UNIT/ML ~~LOC~~ SOLN: 1.0000 [IU] | Freq: Three times a day (TID) | SUBCUTANEOUS | 2 refills | Status: DC

## 2019-07-25 NOTE — Addendum Note (Signed)
Addended by: Cleaster Corin on: 07/25/2019 01:32 PM   Modules accepted: Orders

## 2019-07-25 NOTE — Telephone Encounter (Signed)
Thanks-I missed that!

## 2019-07-26 DIAGNOSIS — D509 Iron deficiency anemia, unspecified: Secondary | ICD-10-CM | POA: Diagnosis not present

## 2019-07-26 DIAGNOSIS — N2581 Secondary hyperparathyroidism of renal origin: Secondary | ICD-10-CM | POA: Diagnosis not present

## 2019-07-26 DIAGNOSIS — Z992 Dependence on renal dialysis: Secondary | ICD-10-CM | POA: Diagnosis not present

## 2019-07-26 DIAGNOSIS — N186 End stage renal disease: Secondary | ICD-10-CM | POA: Diagnosis not present

## 2019-07-28 DIAGNOSIS — N2581 Secondary hyperparathyroidism of renal origin: Secondary | ICD-10-CM | POA: Diagnosis not present

## 2019-07-28 DIAGNOSIS — Z992 Dependence on renal dialysis: Secondary | ICD-10-CM | POA: Diagnosis not present

## 2019-07-28 DIAGNOSIS — D509 Iron deficiency anemia, unspecified: Secondary | ICD-10-CM | POA: Diagnosis not present

## 2019-07-28 DIAGNOSIS — N186 End stage renal disease: Secondary | ICD-10-CM | POA: Diagnosis not present

## 2019-07-31 ENCOUNTER — Ambulatory Visit (HOSPITAL_COMMUNITY): Payer: Medicare Other

## 2019-07-31 DIAGNOSIS — N2581 Secondary hyperparathyroidism of renal origin: Secondary | ICD-10-CM | POA: Diagnosis not present

## 2019-07-31 DIAGNOSIS — Z992 Dependence on renal dialysis: Secondary | ICD-10-CM | POA: Diagnosis not present

## 2019-07-31 DIAGNOSIS — D509 Iron deficiency anemia, unspecified: Secondary | ICD-10-CM | POA: Diagnosis not present

## 2019-07-31 DIAGNOSIS — N186 End stage renal disease: Secondary | ICD-10-CM | POA: Diagnosis not present

## 2019-08-02 DIAGNOSIS — N2581 Secondary hyperparathyroidism of renal origin: Secondary | ICD-10-CM | POA: Diagnosis not present

## 2019-08-02 DIAGNOSIS — N186 End stage renal disease: Secondary | ICD-10-CM | POA: Diagnosis not present

## 2019-08-02 DIAGNOSIS — D509 Iron deficiency anemia, unspecified: Secondary | ICD-10-CM | POA: Diagnosis not present

## 2019-08-02 DIAGNOSIS — Z992 Dependence on renal dialysis: Secondary | ICD-10-CM | POA: Diagnosis not present

## 2019-08-03 ENCOUNTER — Ambulatory Visit
Admission: RE | Admit: 2019-08-03 | Discharge: 2019-08-03 | Disposition: A | Discharge status: Home / Self Care | Payer: Medicare Other | Source: Ambulatory Visit | Attending: Gastroenterology | Admitting: Gastroenterology

## 2019-08-03 ENCOUNTER — Other Ambulatory Visit: Payer: Self-pay

## 2019-08-03 ENCOUNTER — Other Ambulatory Visit: Payer: Self-pay | Admitting: Nurse Practitioner

## 2019-08-03 DIAGNOSIS — L22 Diaper dermatitis: Secondary | ICD-10-CM

## 2019-08-03 DIAGNOSIS — R1310 Dysphagia, unspecified: Secondary | ICD-10-CM | POA: Diagnosis not present

## 2019-08-03 DIAGNOSIS — B372 Candidiasis of skin and nail: Secondary | ICD-10-CM

## 2019-08-04 DIAGNOSIS — Z992 Dependence on renal dialysis: Secondary | ICD-10-CM | POA: Diagnosis not present

## 2019-08-04 DIAGNOSIS — N186 End stage renal disease: Secondary | ICD-10-CM | POA: Diagnosis not present

## 2019-08-04 DIAGNOSIS — N2581 Secondary hyperparathyroidism of renal origin: Secondary | ICD-10-CM | POA: Diagnosis not present

## 2019-08-04 DIAGNOSIS — D509 Iron deficiency anemia, unspecified: Secondary | ICD-10-CM | POA: Diagnosis not present

## 2019-08-07 DIAGNOSIS — Z992 Dependence on renal dialysis: Secondary | ICD-10-CM | POA: Diagnosis not present

## 2019-08-07 DIAGNOSIS — N186 End stage renal disease: Secondary | ICD-10-CM | POA: Diagnosis not present

## 2019-08-07 DIAGNOSIS — N2581 Secondary hyperparathyroidism of renal origin: Secondary | ICD-10-CM | POA: Diagnosis not present

## 2019-08-07 DIAGNOSIS — D509 Iron deficiency anemia, unspecified: Secondary | ICD-10-CM | POA: Diagnosis not present

## 2019-08-09 ENCOUNTER — Telehealth: Payer: Self-pay

## 2019-08-09 ENCOUNTER — Telehealth: Payer: Self-pay | Admitting: Pulmonary Disease

## 2019-08-09 DIAGNOSIS — N2581 Secondary hyperparathyroidism of renal origin: Secondary | ICD-10-CM | POA: Diagnosis not present

## 2019-08-09 DIAGNOSIS — N186 End stage renal disease: Secondary | ICD-10-CM | POA: Diagnosis not present

## 2019-08-09 DIAGNOSIS — Z992 Dependence on renal dialysis: Secondary | ICD-10-CM | POA: Diagnosis not present

## 2019-08-09 DIAGNOSIS — D509 Iron deficiency anemia, unspecified: Secondary | ICD-10-CM | POA: Diagnosis not present

## 2019-08-09 NOTE — Telephone Encounter (Signed)
-----   Message from [name], MD sent at 08/05/2019 10:10 AM EDT ----- Please have the patient come in for a follow up.

## 2019-08-09 NOTE — Telephone Encounter (Signed)
Called patient for COVID-19 pre-screening for in office visit. ° °Have you recently traveled any where out of the local area in the last 2 weeks? No ° °Have you been in close contact with a person diagnosed with COVID-19 or someone awaiting results within the last 2 weeks? No ° °Do you currently have any of the following symptoms? If so, when did they start? °Cough     Diarrhea   Joint Pain °Fever      Muscle Pain   Red eyes °Shortness of breath   Abdominal pain  Vomiting °Loss of smell    Rash    Sore Throat °Headache    Weakness   Bruising or bleeding ° ° °Okay to proceed with visit 08/10/2019 ° ° ° °

## 2019-08-09 NOTE — Telephone Encounter (Signed)
Pt has been scheduled for a follow up appt to discuss barium swallow results.

## 2019-08-10 ENCOUNTER — Telehealth: Payer: Self-pay | Admitting: Pulmonary Disease

## 2019-08-10 ENCOUNTER — Ambulatory Visit (INDEPENDENT_AMBULATORY_CARE_PROVIDER_SITE_OTHER): Payer: Medicare Other | Admitting: Pulmonary Disease

## 2019-08-10 ENCOUNTER — Other Ambulatory Visit: Payer: Self-pay

## 2019-08-10 ENCOUNTER — Encounter: Payer: Self-pay | Admitting: Pulmonary Disease

## 2019-08-10 VITALS — BP 126/72 | HR 87 | Temp 97.2°F | Ht 67.0 in | Wt 219.2 lb

## 2019-08-10 DIAGNOSIS — F17210 Nicotine dependence, cigarettes, uncomplicated: Secondary | ICD-10-CM | POA: Diagnosis not present

## 2019-08-10 DIAGNOSIS — M0579 Rheumatoid arthritis with rheumatoid factor of multiple sites without organ or systems involvement: Secondary | ICD-10-CM

## 2019-08-10 DIAGNOSIS — R911 Solitary pulmonary nodule: Secondary | ICD-10-CM

## 2019-08-10 DIAGNOSIS — R059 Cough, unspecified: Secondary | ICD-10-CM

## 2019-08-10 DIAGNOSIS — R05 Cough: Secondary | ICD-10-CM | POA: Diagnosis not present

## 2019-08-10 DIAGNOSIS — E669 Obesity, unspecified: Secondary | ICD-10-CM

## 2019-08-10 MED ORDER — IPRATROPIUM BROMIDE HFA 17 MCG/ACT IN AERS: 2.0000 | INHALATION_SPRAY | RESPIRATORY_TRACT | 2 refills | Status: DC | PRN

## 2019-08-10 NOTE — Patient Instructions (Signed)
1.  We are scheduling a DEDICATED CT scan of the chest without contrast to evaluate your lung nodule  2.  I suspect your cough is mostly related to esophageal spasm and reflux and this is the reason why albuterol and Symbicort have not worked.  We will however, get breathing tests to make sure there is no issues with the airways.  3.  We will give you a trial of Atrovent inhaler, this inhaler can use the twitchiness in the airways that lead you to cough.  You can use it up to 4 times a day if needed for cough.  4.  We will see you in follow-up in 3 to 4 weeks time to discuss the results of your CT, the breathing test may be scheduled later due to restrictions related to COVID-19 pandemic.  5.  Thank you for trusting [place] with your care

## 2019-08-10 NOTE — Telephone Encounter (Signed)
PA for atrovent has been approved until 12/21/2019. Millen is aware. Nothing further is needed.

## 2019-08-10 NOTE — Telephone Encounter (Signed)
PA has been submitted for Atrovent via CMM.  Determination will be faxed to our office.   SBB:[id number]

## 2019-08-10 NOTE — Progress Notes (Signed)
Subjective:    Patient ID: [name], female    DOB: [birth year], 53 y.o.   MRN: [id number]  HPI Patient is a 53 year old current smoker (3 to 4 cigarettes/day) who presents for evaluation of cough and a lung nodule.  The patient is kindly referred by Dr. Murlean Iba.  Her primary care practitioner is Cassell Smiles, NP.  The patient admits that she has had issues for approximately 4 years but these became more pronounced around 03-05-19.  She notes cough and wheezing for which she was treated with albuterol and Symbicort that have not helped the cough and if anything does make her jittery and make her have palpitations.  The cough is more pronounced when talking or when eating foods.  Of note she also has had issues with dysphasia and had a recent upper GI series on 13 August that showed significant esophageal spasm.  Prior to that she had had issues with hematuria for which a CT abdomen and pelvis was requested by Dr. [name].  This showed a 1.6 x 1.3 cm nodule in the medial LEFT lower lobe that had not been seen previously.  Note however that the study is limited to just the lower portions of the lung as it was not a dedicated chest CT.  The patient has not had any weight loss nor anorexia.  Cough has been for the most part nonproductive and when productive just produces clear sputum.  No hemoptysis.  She has not had any chest pain.  No tachypalpitations.  No orthopnea or paroxysmal nocturnal dyspnea.  Along with occasional dysphagia she also notices gastroesophageal reflux symptoms not relieved with omeprazole.  She has been told she has "COPD" but has had no PFTs.  He does not endorse any other complaints.  Past medical history, past surgical history and family history have been reviewed and are as noted.  Most salient issues is that she has rheumatoid arthritis and possible psoriatic arthritis as well and is on Humira since June 2019.  As noted she is a smoker she has smoked up to 1 pack of  cigarettes per day since 2001 now down to 1/4 pack of cigarettes per day.  She is currently not working and on disability due to end-stage renal disease.  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  Most recently she had been working at [employer] for 10 years.    Review of Systems  Constitutional: Negative.   HENT: Positive for postnasal drip and trouble swallowing.   Eyes: Negative.   Respiratory: Positive for cough.   Cardiovascular: Negative.   Gastrointestinal:       Gastroesophageal reflux symptoms despite omeprazole  Endocrine: Negative.   Genitourinary: Negative.   Musculoskeletal: Negative.   Skin: Negative.   Allergic/Immunologic: Negative.   Hematological: Negative.   Psychiatric/Behavioral: Negative.   All other systems reviewed and are negative.      Objective:   Physical Exam Vitals signs and nursing note reviewed.  Constitutional:      Appearance: Normal appearance. She is obese.     Comments: Ambulates with a cane  HENT:     Head: Normocephalic and atraumatic.     Right Ear: External ear normal.     Left Ear: External ear normal.     Nose:     Comments: Nose/mouth/throat not examined due to masking requirements for COVID 19. Eyes:     General: No scleral icterus.    Extraocular  Movements: Extraocular movements intact.     Conjunctiva/sclera: Conjunctivae normal.     Pupils: Pupils are equal, round, and reactive to light.  Neck:     Musculoskeletal: Neck supple.     Thyroid: No thyromegaly.     Trachea: Trachea and phonation normal.  Cardiovascular:     Rate and Rhythm: Normal rate and regular rhythm.     Pulses: Normal pulses.     Heart sounds: Normal heart sounds.     Arteriovenous access: left arteriovenous access is present. Pulmonary:     Effort: Pulmonary effort is normal.     Breath sounds: Normal breath sounds and air entry.  Abdominal:     General: Abdomen is protuberant. There is  no distension.  Musculoskeletal:     Right lower leg: No edema.     Left lower leg: No edema.     Comments: Requires assistance from cane for ambulation.  Lymphadenopathy:     Cervical: No cervical adenopathy.  Skin:    General: Skin is warm and dry.  Neurological:     General: No focal deficit present.     Mental Status: She is alert and oriented to person, place, and time.     Sensory: Sensory deficit (Peripheral neuropathy lower extremities) present.     Gait: Gait abnormal (Assisted by cane).  Psychiatric:        Mood and Affect: Mood normal.        Behavior: Behavior normal.    Images from the abdominal/pelvic CT scan reviewed as noted below.  The "nodule seen" may be secondary to fluid changes (patient is noted to have a left pleural effusion on that exam and/or atelectasis).  Representative images as below:   She will need dedicated CT scan of the chest.     Assessment & Plan:   1.  Solitary pulmonary nodule: She will need dedicated CT scan of the chest to place the nodule in question in context.  As noted the changes may be related to atelectasis and volume overload issues (recall that the patient is ESRD patient on dialysis) in any event the solitary nodule is not the cause of the patient's cough.  He has had this cough for 4 years or more worsening since February of this year.  The "nodule" was not noted until this most recent study.  Further plans with regards to this issue after review of her dedicated CT scan of the chest which has been scheduled.  Patient has been scheduled to follow-up after that.  2.  Cough: As noted this has been of longstanding.  She has significant reflux symptoms, her cough is more pronounced in relation to meals or when talking.  She has evidence of esophageal spasm on recent upper GI series.  This in turn can cause potential issues with reflux.  She was reminded of antireflux measures.  Will defer management of esophageal spasm to GI.  We will give  her a trial of Atrovent inhaler.  She notes that albuterol and Symbicort are not helpful which is not surprising.  Atrovent helps with decreasing bronchial nerve sensitivity and hopefully will help with her airways reactivity without causing her untoward side effects.  Additional confounding factor in this regard guard is that the patient is on Humira and this can aggravate chronic bronchitic issues as well.  However, her lack of response to ICS/LABA makes this possibility less likely.  3.  Tobacco dependence due to cigarettes: She was counseled regards to discontinuation of smoking.  Total time of counseling 3 to 5 minutes.  4.  Obesity with BMI of 34.3: This issue adds complexity to her management.  5.  Rheumatoid arthritis/psoriatic arthritis: This issue adds complexity to her management.   Thank you for allowing me to participate in this patient's care.   This chart was dictated using voice recognition software/Dragon.  Despite best efforts to proofread, errors can occur which can change the meaning.  Any change was purely unintentional.

## 2019-08-11 DIAGNOSIS — N2581 Secondary hyperparathyroidism of renal origin: Secondary | ICD-10-CM | POA: Diagnosis not present

## 2019-08-11 DIAGNOSIS — D509 Iron deficiency anemia, unspecified: Secondary | ICD-10-CM | POA: Diagnosis not present

## 2019-08-11 DIAGNOSIS — N186 End stage renal disease: Secondary | ICD-10-CM | POA: Diagnosis not present

## 2019-08-11 DIAGNOSIS — Z992 Dependence on renal dialysis: Secondary | ICD-10-CM | POA: Diagnosis not present

## 2019-08-14 DIAGNOSIS — N186 End stage renal disease: Secondary | ICD-10-CM | POA: Diagnosis not present

## 2019-08-14 DIAGNOSIS — D509 Iron deficiency anemia, unspecified: Secondary | ICD-10-CM | POA: Diagnosis not present

## 2019-08-14 DIAGNOSIS — N2581 Secondary hyperparathyroidism of renal origin: Secondary | ICD-10-CM | POA: Diagnosis not present

## 2019-08-14 DIAGNOSIS — Z992 Dependence on renal dialysis: Secondary | ICD-10-CM | POA: Diagnosis not present

## 2019-08-16 DIAGNOSIS — D509 Iron deficiency anemia, unspecified: Secondary | ICD-10-CM | POA: Diagnosis not present

## 2019-08-16 DIAGNOSIS — Z992 Dependence on renal dialysis: Secondary | ICD-10-CM | POA: Diagnosis not present

## 2019-08-16 DIAGNOSIS — N186 End stage renal disease: Secondary | ICD-10-CM | POA: Diagnosis not present

## 2019-08-16 DIAGNOSIS — N2581 Secondary hyperparathyroidism of renal origin: Secondary | ICD-10-CM | POA: Diagnosis not present

## 2019-08-18 ENCOUNTER — Ambulatory Visit
Admission: RE | Admit: 2019-08-18 | Discharge: 2019-08-18 | Disposition: A | Discharge status: Home / Self Care | Payer: Medicare Other | Source: Ambulatory Visit | Attending: Pulmonary Disease | Admitting: Pulmonary Disease

## 2019-08-18 ENCOUNTER — Other Ambulatory Visit: Payer: Self-pay

## 2019-08-18 DIAGNOSIS — R059 Cough, unspecified: Secondary | ICD-10-CM

## 2019-08-18 DIAGNOSIS — N186 End stage renal disease: Secondary | ICD-10-CM | POA: Diagnosis not present

## 2019-08-18 DIAGNOSIS — Z992 Dependence on renal dialysis: Secondary | ICD-10-CM | POA: Diagnosis not present

## 2019-08-18 DIAGNOSIS — N2581 Secondary hyperparathyroidism of renal origin: Secondary | ICD-10-CM | POA: Diagnosis not present

## 2019-08-18 DIAGNOSIS — R05 Cough: Secondary | ICD-10-CM | POA: Diagnosis not present

## 2019-08-18 DIAGNOSIS — R911 Solitary pulmonary nodule: Secondary | ICD-10-CM | POA: Diagnosis not present

## 2019-08-18 DIAGNOSIS — D509 Iron deficiency anemia, unspecified: Secondary | ICD-10-CM | POA: Diagnosis not present

## 2019-08-18 DIAGNOSIS — J9811 Atelectasis: Secondary | ICD-10-CM | POA: Diagnosis not present

## 2019-08-21 ENCOUNTER — Telehealth: Payer: Self-pay | Admitting: Pulmonary Disease

## 2019-08-21 DIAGNOSIS — N2581 Secondary hyperparathyroidism of renal origin: Secondary | ICD-10-CM | POA: Diagnosis not present

## 2019-08-21 DIAGNOSIS — D509 Iron deficiency anemia, unspecified: Secondary | ICD-10-CM | POA: Diagnosis not present

## 2019-08-21 DIAGNOSIS — Z8639 Personal history of other endocrine, nutritional and metabolic disease: Secondary | ICD-10-CM

## 2019-08-21 DIAGNOSIS — Z992 Dependence on renal dialysis: Secondary | ICD-10-CM | POA: Diagnosis not present

## 2019-08-21 DIAGNOSIS — N186 End stage renal disease: Secondary | ICD-10-CM | POA: Diagnosis not present

## 2019-08-21 NOTE — Telephone Encounter (Signed)
No lung nodule was noted. This likely represented a small focus of infection that now has cleared. She does need to have a thyroid ultrasound to evaluate a nodule on the LEFT side of the thyroid.  ATC x2- received recording that call can not be completed at this time.  Will call back

## 2019-08-21 NOTE — Telephone Encounter (Signed)
I have just received the CT results.  I have reviewed the CT as well.  Please see the results note.  The patient will need a thyroid ultrasound as explained on the CT results.  She has no nodule in her lungs.

## 2019-08-21 NOTE — Telephone Encounter (Signed)
Called and spoke to pt, who is requesting CT results from 08/18/2019.  Dr. Patsey Berthold please advise. Thanks

## 2019-08-22 ENCOUNTER — Ambulatory Visit (INDEPENDENT_AMBULATORY_CARE_PROVIDER_SITE_OTHER): Payer: Medicare Other | Admitting: Gastroenterology

## 2019-08-22 ENCOUNTER — Encounter: Payer: Self-pay | Admitting: Gastroenterology

## 2019-08-22 ENCOUNTER — Other Ambulatory Visit: Payer: Self-pay

## 2019-08-22 VITALS — BP 165/89 | HR 93 | Temp 98.4°F | Ht 67.0 in | Wt 218.8 lb

## 2019-08-22 DIAGNOSIS — R4702 Dysphasia: Secondary | ICD-10-CM | POA: Diagnosis not present

## 2019-08-22 NOTE — Telephone Encounter (Signed)
Pt  Calling stating that she has been waiting on a call a/b her results and said that she saw her GI Dr told her that he saw some nodules that has grown in in sizw and some other things she would like a call back from Dr Darnell Level to see what the next steps will be and to see if Dr read test correctly she can be reached @ [phone].Hillery Hunter

## 2019-08-22 NOTE — Progress Notes (Signed)
Primary Care Physician: Mikey College, NP  Primary Gastroenterologist:  Dr. [name]  Chief Complaint  Patient presents with   Follow up barium swallow results    HPI: [name] is a 53 y.o. female here for follow-up of her barium swallow.  The patient had normal passage of contrast material through the esophagus although immediately following that there was changes consistent with esophageal spasms in the distal and midesophagus.  The patient continues to have discomfort when eating.  She reports that she takes diltiazem for her blood pressure.  Current Outpatient Medications  Medication Sig Dispense Refill   Adalimumab (HUMIRA PEN [place]) Inject into the skin every 14 (fourteen) days.     amitriptyline (ELAVIL) 10 MG tablet Take 10 mg by mouth at bedtime.     amitriptyline (ELAVIL) 25 MG tablet Take 25 mg by mouth at bedtime.     atorvastatin (LIPITOR) 20 MG tablet TAKE ONE TABLET BY MOUTH ONCE DAILY 90 tablet 1   calcium acetate (PHOSLO) 667 MG capsule Take 1,334 mg by mouth 3 (three) times daily with meals.      clobetasol (OLUX) 0.05 % topical foam Apply topically 2 (two) times daily.     clobetasol ointment (TEMOVATE) 0.35 % Apply 1 application topically daily as needed (for psorasis).      diltiazem (CARDIZEM) 60 MG tablet Take 1 tablet (60 mg total) by mouth 3 (three) times daily. 90 tablet 11   glucose blood (TRUE METRIX BLOOD GLUCOSE TEST) test strip Use as instructed 100 each 12   insulin aspart (NOVOLOG) 100 UNIT/ML injection Inject 1-2 Units into the skin 3 (three) times daily before meals. 10 mL 2   insulin glargine (LANTUS) 100 UNIT/ML injection Inject 0.1 mLs (10 Units total) into the skin daily. 10 mL 2   INSULIN SYRINGE .5CC/29G (TRUEPLUS INSULIN SYRINGE) 29G X 1/2" 0.5 ML MISC Use 4 times a day as directed per provider. 120 each 4   ipratropium (ATROVENT HFA) 17 MCG/ACT inhaler Inhale 2 puffs into the lungs every 4 (four) hours as needed for  wheezing (COUGH). 1 Inhaler 2   ketoconazole (NIZORAL) 2 % cream APPLY TOPICALLY ONTO THE SKIN TWICE A DAY AS NEEDED 15 g 5   levocetirizine (XYZAL) 5 MG tablet TAKE ONE TABLET BY MOUTH EVERY EVENING 30 tablet 5   loperamide (IMODIUM A-D) 2 MG tablet Take 2 mg by mouth 3 (three) times daily as needed.      midodrine (PROAMATINE) 5 MG tablet TK 1 T PO  DURING DIALYSIS TREATMENT FOR LOW BP  5   omeprazole (PRILOSEC) 40 MG capsule Take 40 mg by mouth daily.     ondansetron (ZOFRAN) 8 MG tablet Take 8 mg by mouth every 8 (eight) hours as needed.      oxyCODONE (OXY IR/ROXICODONE) 5 MG immediate release tablet Take 1 tablet (5 mg total) by mouth 2 (two) times daily as needed for severe pain. 60 tablet 0   [START ON 08/27/2019] oxyCODONE (OXY IR/ROXICODONE) 5 MG immediate release tablet Take 1 tablet (5 mg total) by mouth 2 (two) times daily as needed for severe pain. 60 tablet 0   torsemide (DEMADEX) 100 MG tablet Take 100 mg by mouth daily.     VENTOLIN HFA 108 (90 Base) MCG/ACT inhaler INHALE 2 PUFFS BY MOUTH 4 TIMES A DAY     No current facility-administered medications for this visit.     Allergies as of 08/22/2019 - Review Complete 08/22/2019  Allergen  Reaction Noted   Cinnamon Anaphylaxis 70/17/7939   Garlic Anaphylaxis and Hives 11/28/2014   Onion Hives and Swelling 11/28/2014   Tylenol [acetaminophen] Anaphylaxis 07/25/2015   Ciprofloxacin Diarrhea 11/05/2016   Prednisone Other (See Comments) 01/05/2017    ROS:  General: Negative for anorexia, weight loss, fever, chills, fatigue, weakness. ENT: Negative for hoarseness, difficulty swallowing , nasal congestion. CV: Negative for chest pain, angina, palpitations, dyspnea on exertion, peripheral edema.  Respiratory: Negative for dyspnea at rest, dyspnea on exertion, cough, sputum, wheezing.  GI: See history of present illness. GU:  Negative for dysuria, hematuria, urinary incontinence, urinary frequency, nocturnal  urination.  Endo: Negative for unusual weight change.    Physical Examination:   BP (!) 165/89    Pulse 93    Temp 98.4 F (36.9 C) (Temporal)    Ht 5\' 7"  (1.702 m)    Wt 218 lb 12.8 oz (99.2 kg)    BMI 34.27 kg/m   General: Well-nourished, well-developed in no acute distress.  Eyes: No icterus. Conjunctivae pink. Extremities: No lower extremity edema. No clubbing or deformities. Neuro: Alert and oriented x 3.  Grossly intact. Skin: Warm and dry, no jaundice.   Psych: Alert and cooperative, normal mood and affect.  Labs:    Imaging Studies: Ct Chest Wo Contrast  Result Date: 08/18/2019 CLINICAL DATA:  Cough EXAM: CT CHEST WITHOUT CONTRAST TECHNIQUE: Multidetector CT imaging of the chest was performed following the standard protocol without IV contrast. COMPARISON:  CT 05/24/2017 FINDINGS: Cardiovascular: Mild cardiomegaly. Mild pericardial thickening without pericardial effusion. Atherosclerotic calcification of the aorta and coronary arteries. Left subclavian vascular stent. Main pulmonary trunk measures 3.3 cm in diameter. Mediastinum/Nodes: Interval enlargement of left-sided thyroid nodule now measuring 2.0 cm, previously 1.2 cm. No axillary, mediastinal, or hilar lymphadenopathy. Trachea and esophagus unremarkable. Lungs/Pleura: No focal airspace consolidation, pleural effusion, or pneumothorax. Minimal bibasilar atelectasis. Upper Abdomen: Bilateral renal cortical atrophy. No acute findings within the upper abdomen. Musculoskeletal: No chest wall mass or suspicious bone lesions identified. Multilevel degenerative changes of the thoracic spine. IMPRESSION: 1. No acute cardiopulmonary findings. 2. Interval enlargement of heterogeneous left thyroid lobe nodule. Further evaluation with thyroid ultrasound is recommended given the interval change. 3. Aortic and coronary artery atherosclerosis. 4. Mildly enlarged main pulmonary trunk, which can be seen in the setting of pulmonary arterial  hypertension. Electronically Signed   By: Davina Poke M.D.   On: 08/18/2019 09:12   Dg Esophagus W Single Cm (sol Or Thin Ba)  Result Date: 08/03/2019 CLINICAL DATA:  Difficulty swallowing feeling that food gets stuck in the mid esophagus. History of hiatal hernia. EXAM: ESOPHOGRAM / BARIUM SWALLOW / BARIUM TABLET STUDY TECHNIQUE: Combined double contrast and single contrast examination performed using effervescent crystals, thick barium liquid, and thin barium liquid. The patient was observed with fluoroscopy swallowing a 13 mm barium sulphate tablet. FLUOROSCOPY TIME:  Fluoroscopy Time:  1 minutes 54 seconds Radiation Exposure Index (if provided by the fluoroscopic device): 46.8 mGy Number of Acquired Spot Images: Multiple cine fluoroscopic runs COMPARISON:  None. FINDINGS: The swallowing mechanism and esophageal transit time appears within normal limits. Following clearing of the contrast bolus and there is prominent esophageal spasm in mid esophagus just below the aortic knob and to a lesser degree at the level of the gastroesophageal junction. Small sliding-type hiatal hernia is noted. No reflux or reflux esophagitis is seen. No mucosal abnormality is noted. Barium impregnated tablet passed without difficulty. IMPRESSION: Normal passage of contrast material through the esophagus.  Immediately following the passage of contrast however there are changes of esophageal spasm in the mid and distal esophagus. This is likely the etiology of the patient's underlying discomfort. Electronically Signed   By: Inez Catalina M.D.   On: 08/03/2019 10:05    Assessment and Plan:   [name] is a 53 y.o. y/o female who comes in for follow-up of her barium swallow which showed findings consistent with a distal esophageal and mid esophageal motility disorder.  The patient will be set up for esophageal manometry to delineate what type of disorder of motility she may have.  The patient is already on a calcium channel  blocker which should decrease esophageal spasms thereby making future treatment options limited.  The patient has been explained the plan and agrees with it.    [name], MD. Marval Regal   Note: This dictation was prepared with Dragon dictation along with smaller phrase technology. Any transcriptional errors that result from this process are unintentional.

## 2019-08-22 NOTE — Telephone Encounter (Signed)
Per Dr. Patsey Berthold verbally- echo performed last year did not show pulmonary hypertension and that is a more sensitive scan then CT.  Pt is aware of this information and voiced her understanding. Nothing further is needed.

## 2019-08-22 NOTE — Telephone Encounter (Signed)
Pt is aware of results and voiced her understanding.  Thyroid ultrasound has been ordered.  Pt stated that her GI physician had mentioned that pulmonary hypertension and Atherosclerotic calcification was noted on CT. Pt would like additional information regarding this.   LG please advise. Thanks

## 2019-08-23 DIAGNOSIS — D631 Anemia in chronic kidney disease: Secondary | ICD-10-CM | POA: Diagnosis not present

## 2019-08-23 DIAGNOSIS — N2581 Secondary hyperparathyroidism of renal origin: Secondary | ICD-10-CM | POA: Diagnosis not present

## 2019-08-23 DIAGNOSIS — D509 Iron deficiency anemia, unspecified: Secondary | ICD-10-CM | POA: Diagnosis not present

## 2019-08-23 DIAGNOSIS — N186 End stage renal disease: Secondary | ICD-10-CM | POA: Diagnosis not present

## 2019-08-23 DIAGNOSIS — Z23 Encounter for immunization: Secondary | ICD-10-CM | POA: Diagnosis not present

## 2019-08-23 DIAGNOSIS — Z992 Dependence on renal dialysis: Secondary | ICD-10-CM | POA: Diagnosis not present

## 2019-08-25 DIAGNOSIS — Z23 Encounter for immunization: Secondary | ICD-10-CM | POA: Diagnosis not present

## 2019-08-25 DIAGNOSIS — Z992 Dependence on renal dialysis: Secondary | ICD-10-CM | POA: Diagnosis not present

## 2019-08-25 DIAGNOSIS — D509 Iron deficiency anemia, unspecified: Secondary | ICD-10-CM | POA: Diagnosis not present

## 2019-08-25 DIAGNOSIS — N2581 Secondary hyperparathyroidism of renal origin: Secondary | ICD-10-CM | POA: Diagnosis not present

## 2019-08-25 DIAGNOSIS — D631 Anemia in chronic kidney disease: Secondary | ICD-10-CM | POA: Diagnosis not present

## 2019-08-25 DIAGNOSIS — N186 End stage renal disease: Secondary | ICD-10-CM | POA: Diagnosis not present

## 2019-08-28 DIAGNOSIS — D509 Iron deficiency anemia, unspecified: Secondary | ICD-10-CM | POA: Diagnosis not present

## 2019-08-28 DIAGNOSIS — Z23 Encounter for immunization: Secondary | ICD-10-CM | POA: Diagnosis not present

## 2019-08-28 DIAGNOSIS — N186 End stage renal disease: Secondary | ICD-10-CM | POA: Diagnosis not present

## 2019-08-28 DIAGNOSIS — Z992 Dependence on renal dialysis: Secondary | ICD-10-CM | POA: Diagnosis not present

## 2019-08-28 DIAGNOSIS — D631 Anemia in chronic kidney disease: Secondary | ICD-10-CM | POA: Diagnosis not present

## 2019-08-28 DIAGNOSIS — N2581 Secondary hyperparathyroidism of renal origin: Secondary | ICD-10-CM | POA: Diagnosis not present

## 2019-08-29 ENCOUNTER — Ambulatory Visit: Admission: RE | Admit: 2019-08-29 | Payer: Medicare Other | Source: Ambulatory Visit

## 2019-08-30 DIAGNOSIS — D509 Iron deficiency anemia, unspecified: Secondary | ICD-10-CM | POA: Diagnosis not present

## 2019-08-30 DIAGNOSIS — N186 End stage renal disease: Secondary | ICD-10-CM | POA: Diagnosis not present

## 2019-08-30 DIAGNOSIS — D631 Anemia in chronic kidney disease: Secondary | ICD-10-CM | POA: Diagnosis not present

## 2019-08-30 DIAGNOSIS — Z23 Encounter for immunization: Secondary | ICD-10-CM | POA: Diagnosis not present

## 2019-08-30 DIAGNOSIS — Z992 Dependence on renal dialysis: Secondary | ICD-10-CM | POA: Diagnosis not present

## 2019-08-30 DIAGNOSIS — N2581 Secondary hyperparathyroidism of renal origin: Secondary | ICD-10-CM | POA: Diagnosis not present

## 2019-09-01 DIAGNOSIS — D509 Iron deficiency anemia, unspecified: Secondary | ICD-10-CM | POA: Diagnosis not present

## 2019-09-01 DIAGNOSIS — Z992 Dependence on renal dialysis: Secondary | ICD-10-CM | POA: Diagnosis not present

## 2019-09-01 DIAGNOSIS — N2581 Secondary hyperparathyroidism of renal origin: Secondary | ICD-10-CM | POA: Diagnosis not present

## 2019-09-01 DIAGNOSIS — N186 End stage renal disease: Secondary | ICD-10-CM | POA: Diagnosis not present

## 2019-09-01 DIAGNOSIS — D631 Anemia in chronic kidney disease: Secondary | ICD-10-CM | POA: Diagnosis not present

## 2019-09-01 DIAGNOSIS — Z23 Encounter for immunization: Secondary | ICD-10-CM | POA: Diagnosis not present

## 2019-09-04 DIAGNOSIS — Z23 Encounter for immunization: Secondary | ICD-10-CM | POA: Diagnosis not present

## 2019-09-04 DIAGNOSIS — D509 Iron deficiency anemia, unspecified: Secondary | ICD-10-CM | POA: Diagnosis not present

## 2019-09-04 DIAGNOSIS — N186 End stage renal disease: Secondary | ICD-10-CM | POA: Diagnosis not present

## 2019-09-04 DIAGNOSIS — N2581 Secondary hyperparathyroidism of renal origin: Secondary | ICD-10-CM | POA: Diagnosis not present

## 2019-09-04 DIAGNOSIS — D631 Anemia in chronic kidney disease: Secondary | ICD-10-CM | POA: Diagnosis not present

## 2019-09-04 DIAGNOSIS — Z992 Dependence on renal dialysis: Secondary | ICD-10-CM | POA: Diagnosis not present

## 2019-09-05 ENCOUNTER — Telehealth: Payer: Self-pay | Admitting: Gastroenterology

## 2019-09-05 ENCOUNTER — Ambulatory Visit: Payer: Medicare Other | Admitting: Pulmonary Disease

## 2019-09-05 NOTE — Telephone Encounter (Signed)
Pt left vm she received a letter from [facility] to have a scope done to go down her throat to check her esophagitis   She states she had mentioned something  To a lady in our office that we contact her Medicaid first due to transportation  Not taking her outside [employer]. She needs to speak with someone

## 2019-09-06 DIAGNOSIS — Z23 Encounter for immunization: Secondary | ICD-10-CM | POA: Diagnosis not present

## 2019-09-06 DIAGNOSIS — D631 Anemia in chronic kidney disease: Secondary | ICD-10-CM | POA: Diagnosis not present

## 2019-09-06 DIAGNOSIS — N2581 Secondary hyperparathyroidism of renal origin: Secondary | ICD-10-CM | POA: Diagnosis not present

## 2019-09-06 DIAGNOSIS — D509 Iron deficiency anemia, unspecified: Secondary | ICD-10-CM | POA: Diagnosis not present

## 2019-09-06 DIAGNOSIS — Z992 Dependence on renal dialysis: Secondary | ICD-10-CM | POA: Diagnosis not present

## 2019-09-06 DIAGNOSIS — N186 End stage renal disease: Secondary | ICD-10-CM | POA: Diagnosis not present

## 2019-09-06 NOTE — Telephone Encounter (Signed)
Contacted pt regarding her transportation to [facility] for her [facility] study/Esophgeal manometry. Advised her I have left a vm with the medicaid transportation to return my call to allow them to take her to [place]. Their number is [phone].

## 2019-09-08 DIAGNOSIS — D509 Iron deficiency anemia, unspecified: Secondary | ICD-10-CM | POA: Diagnosis not present

## 2019-09-08 DIAGNOSIS — Z23 Encounter for immunization: Secondary | ICD-10-CM | POA: Diagnosis not present

## 2019-09-08 DIAGNOSIS — Z992 Dependence on renal dialysis: Secondary | ICD-10-CM | POA: Diagnosis not present

## 2019-09-08 DIAGNOSIS — D631 Anemia in chronic kidney disease: Secondary | ICD-10-CM | POA: Diagnosis not present

## 2019-09-08 DIAGNOSIS — N2581 Secondary hyperparathyroidism of renal origin: Secondary | ICD-10-CM | POA: Diagnosis not present

## 2019-09-08 DIAGNOSIS — N186 End stage renal disease: Secondary | ICD-10-CM | POA: Diagnosis not present

## 2019-09-11 ENCOUNTER — Encounter: Payer: Self-pay | Admitting: Student in an Organized Health Care Education/Training Program

## 2019-09-11 DIAGNOSIS — N186 End stage renal disease: Secondary | ICD-10-CM | POA: Diagnosis not present

## 2019-09-11 DIAGNOSIS — N2581 Secondary hyperparathyroidism of renal origin: Secondary | ICD-10-CM | POA: Diagnosis not present

## 2019-09-11 DIAGNOSIS — Z23 Encounter for immunization: Secondary | ICD-10-CM | POA: Diagnosis not present

## 2019-09-11 DIAGNOSIS — D631 Anemia in chronic kidney disease: Secondary | ICD-10-CM | POA: Diagnosis not present

## 2019-09-11 DIAGNOSIS — Z992 Dependence on renal dialysis: Secondary | ICD-10-CM | POA: Diagnosis not present

## 2019-09-11 DIAGNOSIS — D509 Iron deficiency anemia, unspecified: Secondary | ICD-10-CM | POA: Diagnosis not present

## 2019-09-12 ENCOUNTER — Ambulatory Visit (HOSPITAL_BASED_OUTPATIENT_CLINIC_OR_DEPARTMENT_OTHER): Payer: Medicare Other | Admitting: Student in an Organized Health Care Education/Training Program

## 2019-09-12 ENCOUNTER — Other Ambulatory Visit: Payer: Self-pay

## 2019-09-12 ENCOUNTER — Ambulatory Visit
Admission: RE | Admit: 2019-09-12 | Discharge: 2019-09-12 | Disposition: A | Discharge status: Home / Self Care | Payer: Medicare Other | Source: Ambulatory Visit | Attending: Pulmonary Disease | Admitting: Pulmonary Disease

## 2019-09-12 ENCOUNTER — Encounter: Payer: Self-pay | Admitting: Student in an Organized Health Care Education/Training Program

## 2019-09-12 DIAGNOSIS — H4312 Vitreous hemorrhage, left eye: Secondary | ICD-10-CM | POA: Diagnosis not present

## 2019-09-12 DIAGNOSIS — M47816 Spondylosis without myelopathy or radiculopathy, lumbar region: Secondary | ICD-10-CM | POA: Diagnosis not present

## 2019-09-12 DIAGNOSIS — M5412 Radiculopathy, cervical region: Secondary | ICD-10-CM

## 2019-09-12 DIAGNOSIS — M47812 Spondylosis without myelopathy or radiculopathy, cervical region: Secondary | ICD-10-CM | POA: Diagnosis not present

## 2019-09-12 DIAGNOSIS — G894 Chronic pain syndrome: Secondary | ICD-10-CM

## 2019-09-12 DIAGNOSIS — E1142 Type 2 diabetes mellitus with diabetic polyneuropathy: Secondary | ICD-10-CM | POA: Diagnosis not present

## 2019-09-12 DIAGNOSIS — M5030 Other cervical disc degeneration, unspecified cervical region: Secondary | ICD-10-CM

## 2019-09-12 DIAGNOSIS — Z8639 Personal history of other endocrine, nutritional and metabolic disease: Secondary | ICD-10-CM | POA: Diagnosis not present

## 2019-09-12 DIAGNOSIS — M542 Cervicalgia: Secondary | ICD-10-CM

## 2019-09-12 DIAGNOSIS — E041 Nontoxic single thyroid nodule: Secondary | ICD-10-CM | POA: Diagnosis not present

## 2019-09-12 MED ORDER — OXYCODONE HCL 5 MG PO TABS: 5.0000 mg | ORAL_TABLET | Freq: Two times a day (BID) | ORAL | 0 refills | Status: DC | PRN

## 2019-09-12 NOTE — Progress Notes (Signed)
Pain Management Virtual Encounter Note - Virtual Visit via [place] (real-time audio visits between healthcare provider and patient).   Patient's Phone No. & Preferred Pharmacy:  [phone] (home); [phone] (mobile); (Preferred) [phone] [email or web address]  [place], [facility] [address] [place] [address] Phone: [phone] Fax: [phone]    Pre-screening note:  Our staff contacted [name] and offered her an "in person", "face-to-face" appointment versus a telephone encounter. She indicated preferring the telephone encounter, at this time.   Reason for Virtual Visit: COVID-19*  Social distancing based on CDC and AMA recommendations.   I contacted [name] on 09/12/2019 via video conference.      I clearly identified myself as Gillis Santa, MD. I verified that I was speaking with the correct person using two identifiers (Name: [name], and date of birth: 11/27/1966).  Advanced Informed Consent I sought verbal advanced consent from [name] for virtual visit interactions. I informed [name] of possible security and privacy concerns, risks, and limitations associated with providing "not-in-person" medical evaluation and management services. I also informed [name] of the availability of "in-person" appointments. Finally, I informed her that there would be a charge for the virtual visit and that she could be  personally, fully or partially, financially responsible for it. [name] expressed understanding and agreed to proceed.   Historic Elements   [name] is a 53 y.o. year old, female patient evaluated today after her last encounter by our practice on 06/27/2019. [name]  has a past medical history of Allergy, Anemia, Anxiety, Arthritis, Ataxia, COPD (chronic obstructive pulmonary disease) ([place]), Depression, Diabetes mellitus with complication ([place]), ESRD on hemodialysis ([place]), Fistula,  Hepatitis (2003), Hiatal hernia, Hypercholesterolemia, Hypertension, Migraine, Neuropathy, diabetic ([place]), Peripheral vascular disease ([place]), Pneumonia (2015), Psoriasis, Renal insufficiency, Tobacco dependence, and Wears dentures. She also  has a past surgical history that includes Tonsillectomy; rt. tubal and ovary removed; Cholecystectomy; Cardiac catheterization (N/A, 07/25/2015); Cardiac catheterization (Left, 07/25/2015); Cardiac catheterization (Left, 10/07/2015); Cardiac catheterization (N/A, 10/07/2015); Cardiac catheterization (10/07/2015); Cardiac catheterization (N/A, 12/17/2015); Incision and drainage abscess (N/A, 07/16/2016); cyst removed  from left hand (Left, 1989); AV fistula placement (Left, 11/2014); Cardiac catheterization (N/A, 01/11/2017); Cardiac catheterization (N/A, 01/11/2017); DIALYSIS/PERMA CATHETER INSERTION (N/A, 05/20/2017); Cesarean section; Tubal ligation; Colonoscopy (N/A, 09/22/2017); Esophagogastroduodenoscopy (N/A, 09/22/2017); polypectomy (09/22/2017); A/V SHUNT INTERVENTION (N/A, 12/16/2017); and STENT PLACEMENT VASCULAR ([facility] HX) (Left, 03/2018). [name] has a current medication list which includes the following prescription(s): adalimumab, amitriptyline, atorvastatin, calcium acetate, diltiazem, glucose blood, ibu, insulin aspart, insulin glargine, insulin syringe .5cc/29g, ketoconazole, levocetirizine, loperamide, omeprazole, oxycodone, oxycodone, oxycodone, torsemide, amitriptyline, amoxicillin, clobetasol, clobetasol ointment, ipratropium, midodrine, ondansetron, and ventolin hfa. She  reports that she has been smoking cigarettes. She has a 5.00 pack-year smoking history. She uses smokeless tobacco. She reports that she does not drink alcohol or use drugs. [name] is allergic to cinnamon; garlic; onion; tylenol [acetaminophen]; ciprofloxacin; and prednisone.   HPI  Today, she is being contacted for medication management.   Since patient's last visit with me, she did have  left eye vitrectomy. She states that she fell a couple of days ago and has lost vision in that left eye.  She supposed to follow-up with her ophthalmologist today.  In regards to her pain management, patient continues to endorse symptoms of cervical radiculopathy.  She has sharp shooting pain that extends down her right arm in a dermatomal fashion.  She is status post right diagnostic C7-T1 ESI on 12/07/2018 which was not helpful.  Given that she is a diabetic, do not recommend repeating.  We will refill her oxycodone as below.  To help manage her pain and improve her functional status.  She has tried gabapentin and Lyrica in the past which made her manic.  Pharmacotherapy Assessment  Analgesic:  08/26/2019  3   06/27/2019  Oxycodone Hcl 5 MG Tablet  60.00  30 Bi Lat   [id number]   [place] ([id number])   0  15.00 MME  Medicare   [place]    Monitoring: Pharmacotherapy: No side-effects or adverse reactions reported. [place] PMP: PDMP reviewed during this encounter.       Compliance: No problems identified. Effectiveness: Clinically acceptable. Plan: Refer to "POC".  UDS: No results found for: SUMMARY patient on HD, ESRD Laboratory Chemistry Profile (12 mo)  Renal: 05/01/2019: BUN 52; Creatinine, Ser 7.50  Lab Results  Component Value Date   GFRAA 6 (L) 05/01/2019   GFRNONAA 6 (L) 05/01/2019   Hepatic: 07/11/2019: Albumin 4.3 Lab Results  Component Value Date   AST 28 07/11/2019   ALT 24 07/11/2019   Other: No results found for requested labs within last 8760 hours. Note: Above Lab results reviewed.  Imaging  Last 90 days:  Ct Abdomen Pelvis W Wo Contrast  Result Date: 07/14/2019 CLINICAL DATA:  Gross hematuria. EXAM: CT ABDOMEN AND PELVIS WITHOUT AND WITH CONTRAST TECHNIQUE: Multidetector CT imaging of the abdomen and pelvis was performed following the standard protocol before and following the bolus administration of intravenous contrast. CONTRAST:  176mL OMNIPAQUE IOHEXOL 300 MG/ML  SOLN COMPARISON:  CT  abdomen pelvis without contrast 12/15/2017. CT angio chest 05/24/2017. FINDINGS: Lower chest: 1.6 x 1.3 cm nodule identified in the medial left lower lobe (18/19) not definitely seen on prior 2 studies. Hepatobiliary: No suspicious focal abnormality within the liver parenchyma. Gallbladder is surgically absent. No intrahepatic or extrahepatic biliary dilation. Pancreas: No focal mass lesion. No dilatation of the main duct. No intraparenchymal cyst. No peripancreatic edema. Spleen: No splenomegaly. No focal mass lesion. Adrenals/Urinary Tract: No adrenal nodule or mass. Precontrast imaging shows no stones in either kidney or ureter. No bladder stones. Imaging after IV contrast administration shows no enhancing lesion in either kidney. 2.2 cm exophytic cyst noted interpolar right kidney. Delayed imaging shows no soft tissue wall thickening in the intrarenal collecting seen in stump or renal pelvis of either kidney. Neither ureter is well opacified but there is no focal hydroureter or evidence of ureteral wall thickening. No mass lesion evident along the course of either ureter. No focal bladder wall abnormality identified. Stomach/Bowel: Stomach is unremarkable. No gastric wall thickening. No evidence of outlet obstruction. Duodenum is normally positioned as is the ligament of Treitz. No small bowel wall thickening. No small bowel dilatation. The terminal ileum is normal. The appendix is normal. No gross colonic mass. No colonic wall thickening. Vascular/Lymphatic: There is abdominal aortic atherosclerosis without aneurysm. There is no gastrohepatic or hepatoduodenal ligament lymphadenopathy. No intraperitoneal or retroperitoneal lymphadenopathy. No pelvic sidewall lymphadenopathy. Reproductive: The uterus is unremarkable.  There is no adnexal mass. Other: No intraperitoneal free fluid. Musculoskeletal: No worrisome lytic or sclerotic osseous abnormality. IMPRESSION: 1. No findings on today's study to explain the  patient's history of hematuria. 2. 1.6 x 1.3 cm nodule in the medial left lower lobe, not definitely seen on the prior 2 studies. As neoplasm cannot be excluded, PET-CT recommended to further evaluate. 3.  Aortic Atherosclerois (ICD10-170.0) Electronically Signed   By: Misty Stanley M.D.   On: 07/14/2019 13:09   Ct Chest Wo Contrast  Result Date: 08/18/2019 CLINICAL DATA:  Cough EXAM: CT CHEST WITHOUT CONTRAST TECHNIQUE: Multidetector CT imaging of the chest was performed following the standard protocol without IV contrast. COMPARISON:  CT 05/24/2017 FINDINGS: Cardiovascular: Mild cardiomegaly. Mild pericardial thickening without pericardial effusion. Atherosclerotic calcification of the aorta and coronary arteries. Left subclavian vascular stent. Main pulmonary trunk measures 3.3 cm in diameter. Mediastinum/Nodes: Interval enlargement of left-sided thyroid nodule now measuring 2.0 cm, previously 1.2 cm. No axillary, mediastinal, or hilar lymphadenopathy. Trachea and esophagus unremarkable. Lungs/Pleura: No focal airspace consolidation, pleural effusion, or pneumothorax. Minimal bibasilar atelectasis. Upper Abdomen: Bilateral renal cortical atrophy. No acute findings within the upper abdomen. Musculoskeletal: No chest wall mass or suspicious bone lesions identified. Multilevel degenerative changes of the thoracic spine. IMPRESSION: 1. No acute cardiopulmonary findings. 2. Interval enlargement of heterogeneous left thyroid lobe nodule. Further evaluation with thyroid ultrasound is recommended given the interval change. 3. Aortic and coronary artery atherosclerosis. 4. Mildly enlarged main pulmonary trunk, which can be seen in the setting of pulmonary arterial hypertension. Electronically Signed   By: Davina Poke M.D.   On: 08/18/2019 09:12   Dg Esophagus W Single Cm (sol Or Thin Ba)  Result Date: 08/03/2019 CLINICAL DATA:  Difficulty swallowing feeling that food gets stuck in the mid esophagus. History  of hiatal hernia. EXAM: ESOPHOGRAM / BARIUM SWALLOW / BARIUM TABLET STUDY TECHNIQUE: Combined double contrast and single contrast examination performed using effervescent crystals, thick barium liquid, and thin barium liquid. The patient was observed with fluoroscopy swallowing a 13 mm barium sulphate tablet. FLUOROSCOPY TIME:  Fluoroscopy Time:  1 minutes 54 seconds Radiation Exposure Index (if provided by the fluoroscopic device): 46.8 mGy Number of Acquired Spot Images: Multiple cine fluoroscopic runs COMPARISON:  None. FINDINGS: The swallowing mechanism and esophageal transit time appears within normal limits. Following clearing of the contrast bolus and there is prominent esophageal spasm in mid esophagus just below the aortic knob and to a lesser degree at the level of the gastroesophageal junction. Small sliding-type hiatal hernia is noted. No reflux or reflux esophagitis is seen. No mucosal abnormality is noted. Barium impregnated tablet passed without difficulty. IMPRESSION: Normal passage of contrast material through the esophagus. Immediately following the passage of contrast however there are changes of esophageal spasm in the mid and distal esophagus. This is likely the etiology of the patient's underlying discomfort. Electronically Signed   By: Inez Catalina M.D.   On: 08/03/2019 10:05    Assessment  The primary encounter diagnosis was Cervical radiculopathy. Diagnoses of Diabetic polyneuropathy associated with type 2 diabetes mellitus ([place]), DDD (degenerative disc disease), cervical, Cervicalgia, Spondylosis of cervical region without myelopathy or radiculopathy, Lumbar spondylosis, and Chronic pain syndrome were also pertinent to this visit.  Plan of Care  I am having [name] start on oxyCODONE and oxyCODONE. I am also having her maintain her clobetasol ointment, calcium acetate, loperamide, glucose blood, ondansetron, midodrine, torsemide, clobetasol, Adalimumab (HUMIRA PEN [place]),  diltiazem, amitriptyline, INSULIN SYRINGE .5CC/29G, levocetirizine, omeprazole, Ventolin HFA, atorvastatin, amitriptyline, insulin glargine, insulin aspart, ketoconazole, ipratropium, amoxicillin, IBU, and oxyCODONE.  Pharmacotherapy (Medications Ordered): Meds ordered this encounter  Medications  . oxyCODONE (OXY IR/ROXICODONE) 5 MG immediate release tablet    Sig: Take 1 tablet (5 mg total) by mouth 2 (two) times daily as needed for severe pain.    Dispense:  60 tablet  Refill:  0  . oxyCODONE (OXY IR/ROXICODONE) 5 MG immediate release tablet    Sig: Take 1 tablet (5 mg total) by mouth 2 (two) times daily as needed for severe pain.    Dispense:  60 tablet    Refill:  0  . oxyCODONE (OXY IR/ROXICODONE) 5 MG immediate release tablet    Sig: Take 1 tablet (5 mg total) by mouth 2 (two) times daily as needed for severe pain.    Dispense:  60 tablet    Refill:  0    Follow-up plan:   Return in about 3 months (around 12/12/2019) for Medication Management.    Recent Visits Date Type Provider Dept  06/27/19 Office Visit Gillis Santa, MD [facility]  Showing recent visits within past 90 days and meeting all other requirements   Today's Visits Date Type Provider Dept  09/12/19 Office Visit Gillis Santa, MD [facility]  Showing today's visits and meeting all other requirements   Future Appointments No visits were found meeting these conditions.  Showing future appointments within next 90 days and meeting all other requirements   I discussed the assessment and treatment plan with the patient. The patient was provided an opportunity to ask questions and all were answered. The patient agreed with the plan and demonstrated an understanding of the instructions.  Patient advised to call back or seek an in-person evaluation if the symptoms or condition worsens.  Total duration of non-face-to-face encounter:25 minutes.  Note by: Gillis Santa, MD Date: 09/12/2019; Time:  9:32 AM  Note: This dictation was prepared with Dragon dictation. Any transcriptional errors that may result from this process are unintentional.  Disclaimer:  * Given the special circumstances of the COVID-19 pandemic, the federal government has announced that the Office for Civil Rights (OCR) will exercise its enforcement discretion and will not impose penalties on physicians using telehealth in the event of noncompliance with regulatory requirements under the [place] and [place] (HIPAA) in connection with the good faith provision of telehealth during the MGQQP-61 national public health emergency. ([place])

## 2019-09-13 DIAGNOSIS — Z992 Dependence on renal dialysis: Secondary | ICD-10-CM | POA: Diagnosis not present

## 2019-09-13 DIAGNOSIS — D509 Iron deficiency anemia, unspecified: Secondary | ICD-10-CM | POA: Diagnosis not present

## 2019-09-13 DIAGNOSIS — N186 End stage renal disease: Secondary | ICD-10-CM | POA: Diagnosis not present

## 2019-09-13 DIAGNOSIS — N2581 Secondary hyperparathyroidism of renal origin: Secondary | ICD-10-CM | POA: Diagnosis not present

## 2019-09-13 DIAGNOSIS — Z23 Encounter for immunization: Secondary | ICD-10-CM | POA: Diagnosis not present

## 2019-09-13 DIAGNOSIS — D631 Anemia in chronic kidney disease: Secondary | ICD-10-CM | POA: Diagnosis not present

## 2019-09-18 ENCOUNTER — Other Ambulatory Visit: Payer: Self-pay | Admitting: Pulmonary Disease

## 2019-09-18 ENCOUNTER — Telehealth: Payer: Self-pay | Admitting: Pulmonary Disease

## 2019-09-18 DIAGNOSIS — D509 Iron deficiency anemia, unspecified: Secondary | ICD-10-CM | POA: Diagnosis not present

## 2019-09-18 DIAGNOSIS — E041 Nontoxic single thyroid nodule: Secondary | ICD-10-CM

## 2019-09-18 DIAGNOSIS — Z992 Dependence on renal dialysis: Secondary | ICD-10-CM | POA: Diagnosis not present

## 2019-09-18 DIAGNOSIS — N186 End stage renal disease: Secondary | ICD-10-CM | POA: Diagnosis not present

## 2019-09-18 DIAGNOSIS — Z23 Encounter for immunization: Secondary | ICD-10-CM | POA: Diagnosis not present

## 2019-09-18 DIAGNOSIS — N2581 Secondary hyperparathyroidism of renal origin: Secondary | ICD-10-CM | POA: Diagnosis not present

## 2019-09-18 DIAGNOSIS — D631 Anemia in chronic kidney disease: Secondary | ICD-10-CM | POA: Diagnosis not present

## 2019-09-18 NOTE — Telephone Encounter (Signed)
PFT has been canceled per pt request per pt's chart. Will close encounter.

## 2019-09-18 NOTE — Addendum Note (Signed)
Addended by: Maryanna Shape A on: 09/18/2019 01:54 PM   Modules accepted: Orders

## 2019-09-18 NOTE — Telephone Encounter (Signed)
Left message to relay date/time for covid test. 09/19/2019 prior [facility] 11[facility]

## 2019-09-19 ENCOUNTER — Other Ambulatory Visit: Payer: Medicare Other

## 2019-09-19 ENCOUNTER — Other Ambulatory Visit: Payer: Self-pay | Admitting: Nurse Practitioner

## 2019-09-20 ENCOUNTER — Ambulatory Visit: Payer: Medicare Other

## 2019-09-20 DIAGNOSIS — Z23 Encounter for immunization: Secondary | ICD-10-CM | POA: Diagnosis not present

## 2019-09-20 DIAGNOSIS — N2581 Secondary hyperparathyroidism of renal origin: Secondary | ICD-10-CM | POA: Diagnosis not present

## 2019-09-20 DIAGNOSIS — Z992 Dependence on renal dialysis: Secondary | ICD-10-CM | POA: Diagnosis not present

## 2019-09-20 DIAGNOSIS — D631 Anemia in chronic kidney disease: Secondary | ICD-10-CM | POA: Diagnosis not present

## 2019-09-20 DIAGNOSIS — D509 Iron deficiency anemia, unspecified: Secondary | ICD-10-CM | POA: Diagnosis not present

## 2019-09-20 DIAGNOSIS — N186 End stage renal disease: Secondary | ICD-10-CM | POA: Diagnosis not present

## 2019-09-20 NOTE — Progress Notes (Signed)
MRN : [id number]  [name] is a 53 y.o. (02-13-1966) female who presents with chief complaint of No chief complaint on file. Marland Kitchen  History of Present Illness:   The patient returns to the office for follow up regarding problem with the dialysis access. Currently the patient is maintained via a left brachial cephalic fistula  The patient has increasing pain in the 3rd-5th fingers associated with mild cyanosis.  The patient notes a significant increase in bleeding time after decannulation.  The patient has also been informed that there is increased recirculation.    The patient denies hand pain or other symptoms consistent with steal phenomena.  No significant arm swelling.  The patient denies redness or swelling at the access site. The patient denies fever or chills at home or while on dialysis.  The patient denies amaurosis fugax or recent TIA symptoms. There are no recent neurological changes noted. The patient denies claudication symptoms or rest pain symptoms. The patient denies history of DVT, PE or superficial thrombophlebitis. The patient denies recent episodes of angina or shortness of breath.   Duplex ultrasound shows a mid fistula stenosis.  Volume flow is 1544 cc/min  No outpatient medications have been marked as taking for the 09/21/19 encounter (Appointment) with Delana Meyer, Dolores Lory, MD.    Past Medical History:  Diagnosis Date  . Allergy   . Anemia   . Anxiety    worse when not at home - bowel incontinence  . Arthritis   . Ataxia   . COPD (chronic obstructive pulmonary disease) ([place])   . Depression   . Diabetes mellitus with complication ([place])   . ESRD on hemodialysis ([place])    MWF dialysis  . Fistula    left upper arm  . Hepatitis 2003   Hep C  . Hiatal hernia   . Hypercholesterolemia   . Hypertension   . Migraine   . Neuropathy, diabetic (H[facility])    lower legs  . Peripheral vascular disease ([place])   . Pneumonia 2015  . Psoriasis   . Renal insufficiency    . Tobacco dependence   . Wears dentures    full upper, partial lower    Past Surgical History:  Procedure Laterality Date  . A/V SHUNT INTERVENTION N/A 12/16/2017   Procedure: A/V SHUNT INTERVENTION;  Surgeon: Algernon Huxley, MD;  Location: [place] [facility] [facility];  Service: Cardiovascular;  Laterality: N/A;  . AV FISTULA PLACEMENT Left 11/2014  . CESAREAN SECTION    . CHOLECYSTECTOMY    . COLONOSCOPY N/A 09/22/2017   Procedure: COLONOSCOPY;  Surgeon: Lin Landsman, MD;  Location: [place];  Service: Endoscopy;  Laterality: N/A;  . cyst removed  from left hand Left 1989  . DIALYSIS/PERMA CATHETER INSERTION N/A 05/20/2017   Procedure: Dialysis/Perma Catheter Insertion and fistulagram/LUE angiogram;  Surgeon: Algernon Huxley, MD;  Location: [place] [facility];  Service: Cardiovascular;  Laterality: N/A;  . ESOPHAGOGASTRODUODENOSCOPY N/A 09/22/2017   Procedure: ESOPHAGOGASTRODUODENOSCOPY (EGD);  Surgeon: Lin Landsman, MD;  Location: [place];  Service: Endoscopy;  Laterality: N/A;  . INCISION AND DRAINAGE ABSCESS N/A 07/16/2016   Procedure: INCISION AND DRAINAGE ABSCESS;  Surgeon: Carloyn Manner, MD;  Location: [facility];  Service: ENT;  Laterality: N/A;  . PERIPHERAL VASCULAR CATHETERIZATION N/A 07/25/2015   Procedure: A/V Shuntogram/Fistulagram;  Surgeon: Algernon Huxley, MD;  Location: [place] [facility];  Service: Cardiovascular;  Laterality: N/A;  . PERIPHERAL VASCULAR CATHETERIZATION Left 07/25/2015   Procedure:  A/V Shunt Intervention;  Surgeon: Algernon Huxley, MD;  Location: [place] [facility];  Service: Cardiovascular;  Laterality: Left;  . PERIPHERAL VASCULAR CATHETERIZATION Left 10/07/2015   Procedure: A/V Shuntogram/Fistulagram;  Surgeon: Algernon Huxley, MD;  Location: [place] [facility];  Service: Cardiovascular;  Laterality: Left;  . PERIPHERAL VASCULAR CATHETERIZATION N/A 10/07/2015   Procedure: A/V Shunt Intervention;  Surgeon: Algernon Huxley, MD;  Location:  [place] [facility];  Service: Cardiovascular;  Laterality: N/A;  . PERIPHERAL VASCULAR CATHETERIZATION  10/07/2015   Procedure: Dialysis/Perma Catheter Insertion;  Surgeon: Algernon Huxley, MD;  Location: [place] [facility];  Service: Cardiovascular;;  . PERIPHERAL VASCULAR CATHETERIZATION N/A 12/17/2015   Procedure: Dialysis/Perma Catheter Removal;  Surgeon: Katha Cabal, MD;  Location: [place] [facility];  Service: Cardiovascular;  Laterality: N/A;  . PERIPHERAL VASCULAR CATHETERIZATION N/A 01/11/2017   Procedure: Visceral Angiography;  Surgeon: Algernon Huxley, MD;  Location: [place] [facility];  Service: Cardiovascular;  Laterality: N/A;  . PERIPHERAL VASCULAR CATHETERIZATION N/A 01/11/2017   Procedure: Visceral Artery Intervention;  Surgeon: Algernon Huxley, MD;  Location: [place] [facility];  Service: Cardiovascular;  Laterality: N/A;  . POLYPECTOMY  09/22/2017   Procedure: POLYPECTOMY;  Surgeon: Lin Landsman, MD;  Location: [place];  Service: Endoscopy;;  . rt. tubal and ovary removed    . STENT PLACEMENT VASCULAR ([place] HX) Left 03/2018   Performed at [facility] using EV3 protg GPS stent graph [id number] lot [id number]  . TONSILLECTOMY    . TUBAL LIGATION      Social History Social History   Tobacco Use  . Smoking status: Light Tobacco Smoker    Packs/day: 0.25    Years: 20.00    Pack years: 5.00    Types: Cigarettes    Last attempt to quit: 05/21/2017    Years since quitting: 2.3  . Smokeless tobacco: Current User  Substance Use Topics  . Alcohol use: No  . Drug use: No    Family History Family History  Problem Relation Age of Onset  . Hypertension Mother   . Heart disease Mother   . Hypertension Father   . Skin cancer Father   No family history of bleeding/clotting disorders, porphyria or autoimmune disease   Allergies  Allergen Reactions  . Cinnamon Anaphylaxis  . Garlic Anaphylaxis and Hives  . Onion Hives and Swelling   . Tylenol [Acetaminophen] Anaphylaxis  . Ciprofloxacin Diarrhea  . Prednisone Other (See Comments)    Vaginal blisters & oral blisters Vaginal blisters & oral blisters Vaginal blisters & oral blisters     REVIEW OF SYSTEMS (Negative unless checked)  Constitutional: [] Weight loss  [] Fever  [] Chills Cardiac: [] Chest pain   [] Chest pressure   [] Palpitations   [] Shortness of breath when laying flat   [] Shortness of breath with exertion. Vascular:  [] Pain in legs with walking   [] Pain in legs at rest  [] History of DVT   [] Phlebitis   [] Swelling in legs   [] Varicose veins   [] Non-healing ulcers Pulmonary:   [] Uses home oxygen   [] Productive cough   [] Hemoptysis   [] Wheeze  [] COPD   [] Asthma Neurologic:  [] Dizziness   [] Seizures   [] History of stroke   [] History of TIA  [] Aphasia   [] Vissual changes   [] Weakness or numbness in arm   [] Weakness or numbness in leg Musculoskeletal:   [] Joint swelling   [] Joint pain   [] Low back pain Hematologic:  [] Easy bruising  []   Easy bleeding   [] Hypercoagulable state   [] Anemic Gastrointestinal:  [] Diarrhea   [] Vomiting  [] Gastroesophageal reflux/heartburn   [] Difficulty swallowing. Genitourinary:  [x] Chronic kidney disease   [] Difficult urination  [] Frequent urination   [] Blood in urine Skin:  [] Rashes   [] Ulcers  Psychological:  [] History of anxiety   []  History of major depression.  Physical Examination  There were no vitals filed for this visit. There is no height or weight on file to calculate BMI. Gen: WD/WN, NAD Head: [place]/AT, No temporalis wasting.  Ear/Nose/Throat: Hearing grossly intact, nares w/o erythema or drainage, poor dentition Eyes: PER, EOMI, sclera nonicteric.  Neck: Supple, no masses.  No bruit or JVD.  Pulmonary:  Good air movement, clear to auscultation bilaterally, no use of accessory muscles.  Cardiac: RRR, normal S1, S2, no Murmurs. Vascular: moderate aneurysm of the left fistula; 3-5th fingers left hand are cold and mildly  cyanotic Vessel Right Left  Radial Palpable Not Palpable  Ulnar Not Palpable Not Palpable  Brachial Palpable Palpable  Carotid Palpable Palpable  Gastrointestinal: soft, non-distended. No guarding/no peritoneal signs.  Musculoskeletal: M/S 5/5 throughout.  No deformity or atrophy.  Neurologic: CN 2-12 intact. Pain and light touch intact in extremities.  Symmetrical.  Speech is fluent. Motor exam as listed above. Psychiatric: Judgment intact, Mood & affect appropriate for pt's clinical situation. Dermatologic: No rashes or ulcers noted.  No changes consistent with cellulitis. Lymph : No Cervical lymphadenopathy, no lichenification or skin changes of chronic lymphedema.  CBC Lab Results  Component Value Date   WBC 6.4 05/01/2019   HGB 9.3 (L) 05/01/2019   HCT 27.8 (L) 05/01/2019   MCV 91.4 05/01/2019   PLT 78 (L) 05/01/2019    BMET    Component Value Date/Time   NA 128 (L) 05/01/2019 0015   NA 136 (A) 12/08/2017   NA 133 (L) 10/31/2014 0955   K 5.2 (H) 05/01/2019 0015   K 4.7 10/31/2014 0955   CL 96 (L) 05/01/2019 0015   CL 102 10/31/2014 0955   CO2 21 (L) 05/01/2019 0015   CO2 27 10/31/2014 0955   GLUCOSE 152 (H) 05/01/2019 0015   GLUCOSE 203 (H) 10/31/2014 0955   BUN 52 (H) 05/01/2019 0015   BUN 35 (H) 10/31/2014 0955   CREATININE 7.50 (H) 05/01/2019 0015   CREATININE 7.43 (H) 12/27/2017 1035   CALCIUM 7.7 (L) 05/01/2019 0015   CALCIUM 7.7 (L) 10/31/2014 0955   GFRNONAA 6 (L) 05/01/2019 0015   GFRNONAA 6 (L) 12/27/2017 1035   GFRAA 6 (L) 05/01/2019 0015   GFRAA 7 (L) 12/27/2017 1035   CrCl cannot be calculated (Patient's most recent lab result is older than the maximum 21 days allowed.).  COAG Lab Results  Component Value Date   INR 1.2 04/30/2019   INR 1.05 04/07/2018   INR 1.01 10/12/2017    Radiology US Thyroid  Result Date: 09/12/2019 CLINICAL DATA:  Nodule noted on CT chest. EXAM: THYROID ULTRASOUND TECHNIQUE: Ultrasound examination of the thyroid  gland and adjacent soft tissues was performed. COMPARISON:  CT 08/18/2019 FINDINGS: Parenchymal Echotexture: Mildly heterogenous Isthmus: 0.8 cm thickness Right lobe: 5.6 x 1.6 x 1.9 cm Left lobe: 4.7 x 2.3 x 2.5 cm _________________________________________________________ Estimated total number of nodules >/= 1 cm: 1 Number of spongiform nodules >/=  2 cm not described below (TR1): 0 Number of mixed cystic and solid nodules >/= 1.5 cm not described below (TR2): 0 _________________________________________________________ Nodule # 1: Location: Left; Mid Maximum size: 2.2 cm; Other  2 dimensions: 2.2 x 2 cm Composition: mixed cystic and solid (1) Echogenicity: isoechoic (1) Shape: taller-than-wide (3) Margins: ill-defined (0) Echogenic foci: punctate echogenic foci (3) ACR TI-RADS total points: 8. ACR TI-RADS risk category: TR5 (>/= 7 points). ACR TI-RADS recommendations: **Given size (>/= 1.0 cm) and appearance, fine needle aspiration of this highly suspicious nodule should be considered based on TI-RADS criteria. _________________________________________________________ Bilateral morphologically unremarkable cervical lymph nodes incidentally noted, none greater than 0.6 cm short axis diameter. IMPRESSION: 1. Mild thyromegaly with solitary 2.2 cm suspicious mid left nodule. Recommend FNA biopsy. The above is in keeping with the ACR TI-RADS recommendations - J Am Coll Radiol 2017;14:587-595. Electronically Signed   By: Lucrezia Europe M.D.   On: 09/12/2019 14:05     Assessment/Plan 1. Malfunction of arteriovenous dialysis fistula, subsequent encounter Recommend:  The patient is experiencing increasing problems with their dialysis access.  This includes suspected steal syndrome given her finger changes.  Patient should have an arm angiogram which will include the fistulogram with the possibility for intervention.  The intention for intervention is to restore appropriate flow and prevent thrombosis and possible loss  of the access.  As well as improve the quality of dialysis therapy.  The risks, benefits and alternative therapies were reviewed in detail with the patient.  All questions were answered.  The patient agrees to proceed with angio/intervention.     A total of 30 minutes was spent with this patient and greater than 50% was spent in counseling and coordination of care with the patient.  Discussion included the treatment options for vascular disease including indications for surgery and intervention.  Also discussed is the appropriate timing of treatment.  In addition medical therapy was discussed.  2. ESRD (end stage renal disease) on dialysis ([place]) Continue dialysis without interuption  3. Type 2 diabetes mellitus with ESRD (end-stage renal disease) ([place]) Continue hypoglycemic medications as already ordered, these medications have been reviewed and there are no changes at this time.  Hgb A1C to be monitored as already arranged by primary service   4. Essential hypertension Continue antihypertensive medications as already ordered, these medications have been reviewed and there are no changes at this time.   5. Osteoarthritis of spine with radiculopathy, cervical region Continue NSAID medications as already ordered, these medications have been reviewed and there are no changes at this time.  Continued activity and therapy was stressed.     Hortencia Pilar, MD  09/20/2019 7:47 PM

## 2019-09-21 ENCOUNTER — Other Ambulatory Visit: Payer: Self-pay

## 2019-09-21 ENCOUNTER — Other Ambulatory Visit (INDEPENDENT_AMBULATORY_CARE_PROVIDER_SITE_OTHER): Payer: Self-pay | Admitting: Vascular Surgery

## 2019-09-21 ENCOUNTER — Other Ambulatory Visit (INDEPENDENT_AMBULATORY_CARE_PROVIDER_SITE_OTHER): Payer: Medicare Other

## 2019-09-21 ENCOUNTER — Encounter (INDEPENDENT_AMBULATORY_CARE_PROVIDER_SITE_OTHER): Payer: Self-pay | Admitting: Vascular Surgery

## 2019-09-21 ENCOUNTER — Ambulatory Visit (INDEPENDENT_AMBULATORY_CARE_PROVIDER_SITE_OTHER): Payer: Medicare Other | Admitting: Vascular Surgery

## 2019-09-21 ENCOUNTER — Telehealth (INDEPENDENT_AMBULATORY_CARE_PROVIDER_SITE_OTHER): Payer: Self-pay

## 2019-09-21 VITALS — BP 146/77 | HR 80 | Resp 16 | Wt 223.6 lb

## 2019-09-21 DIAGNOSIS — N186 End stage renal disease: Secondary | ICD-10-CM

## 2019-09-21 DIAGNOSIS — E1122 Type 2 diabetes mellitus with diabetic chronic kidney disease: Secondary | ICD-10-CM

## 2019-09-21 DIAGNOSIS — M4722 Other spondylosis with radiculopathy, cervical region: Secondary | ICD-10-CM | POA: Diagnosis not present

## 2019-09-21 DIAGNOSIS — I10 Essential (primary) hypertension: Secondary | ICD-10-CM

## 2019-09-21 DIAGNOSIS — T82590D Other mechanical complication of surgically created arteriovenous fistula, subsequent encounter: Secondary | ICD-10-CM

## 2019-09-21 DIAGNOSIS — Z992 Dependence on renal dialysis: Secondary | ICD-10-CM

## 2019-09-21 NOTE — Telephone Encounter (Signed)
I attempted to contact the patient give her the information about the angio and a message was left for a return call. Patient is scheduled with Dr. Delana Meyer for angio on 09/29/2019 with a 9:30 am arrival time to the [facility]. Patient will do her Covid testing on 09/27/2019 between 12:30-2:30 pm at the [facility]. This information will be mailed to the patient.

## 2019-09-22 ENCOUNTER — Telehealth: Payer: Self-pay | Admitting: Pulmonary Disease

## 2019-09-22 DIAGNOSIS — N186 End stage renal disease: Secondary | ICD-10-CM | POA: Diagnosis not present

## 2019-09-22 DIAGNOSIS — D509 Iron deficiency anemia, unspecified: Secondary | ICD-10-CM | POA: Diagnosis not present

## 2019-09-22 DIAGNOSIS — N2581 Secondary hyperparathyroidism of renal origin: Secondary | ICD-10-CM | POA: Diagnosis not present

## 2019-09-22 DIAGNOSIS — Z992 Dependence on renal dialysis: Secondary | ICD-10-CM | POA: Diagnosis not present

## 2019-09-22 DIAGNOSIS — D631 Anemia in chronic kidney disease: Secondary | ICD-10-CM | POA: Diagnosis not present

## 2019-09-22 NOTE — Telephone Encounter (Signed)
Spoke with the patient and she is aware of all appt's regarding her procedure. On 09/29/2019 with Dr. Delana Meyer.

## 2019-09-22 NOTE — Telephone Encounter (Signed)
Per Suanne Marker covid test is not needed for tyroid biopsy. Left message to make pt aware.

## 2019-09-22 NOTE — Telephone Encounter (Signed)
Patient will need to do her Covid testing at 8:00 am at the [facility] on 09/27/2019 due to her being at dialysis between 11-5.

## 2019-09-22 NOTE — Telephone Encounter (Signed)
Pt is aware and voiced her understanding.  Nothing further is needed.  

## 2019-09-25 ENCOUNTER — Other Ambulatory Visit: Payer: Self-pay | Admitting: Pulmonary Disease

## 2019-09-25 DIAGNOSIS — Z992 Dependence on renal dialysis: Secondary | ICD-10-CM | POA: Diagnosis not present

## 2019-09-25 DIAGNOSIS — N186 End stage renal disease: Secondary | ICD-10-CM | POA: Diagnosis not present

## 2019-09-25 DIAGNOSIS — D509 Iron deficiency anemia, unspecified: Secondary | ICD-10-CM | POA: Diagnosis not present

## 2019-09-25 DIAGNOSIS — D631 Anemia in chronic kidney disease: Secondary | ICD-10-CM | POA: Diagnosis not present

## 2019-09-25 DIAGNOSIS — N2581 Secondary hyperparathyroidism of renal origin: Secondary | ICD-10-CM | POA: Diagnosis not present

## 2019-09-27 ENCOUNTER — Other Ambulatory Visit
Admission: RE | Admit: 2019-09-27 | Discharge: 2019-09-27 | Disposition: A | Discharge status: Home / Self Care | Payer: Medicare Other | Source: Ambulatory Visit | Attending: Vascular Surgery | Admitting: Vascular Surgery

## 2019-09-27 ENCOUNTER — Other Ambulatory Visit: Payer: Self-pay

## 2019-09-27 ENCOUNTER — Ambulatory Visit: Payer: Medicare Other

## 2019-09-27 DIAGNOSIS — Z20828 Contact with and (suspected) exposure to other viral communicable diseases: Secondary | ICD-10-CM | POA: Insufficient documentation

## 2019-09-27 DIAGNOSIS — Z01812 Encounter for preprocedural laboratory examination: Secondary | ICD-10-CM | POA: Insufficient documentation

## 2019-09-27 DIAGNOSIS — N2581 Secondary hyperparathyroidism of renal origin: Secondary | ICD-10-CM | POA: Diagnosis not present

## 2019-09-27 DIAGNOSIS — N186 End stage renal disease: Secondary | ICD-10-CM | POA: Diagnosis not present

## 2019-09-27 DIAGNOSIS — D509 Iron deficiency anemia, unspecified: Secondary | ICD-10-CM | POA: Diagnosis not present

## 2019-09-27 DIAGNOSIS — D631 Anemia in chronic kidney disease: Secondary | ICD-10-CM | POA: Diagnosis not present

## 2019-09-27 DIAGNOSIS — Z992 Dependence on renal dialysis: Secondary | ICD-10-CM | POA: Diagnosis not present

## 2019-09-28 ENCOUNTER — Telehealth (INDEPENDENT_AMBULATORY_CARE_PROVIDER_SITE_OTHER): Payer: Self-pay

## 2019-09-28 ENCOUNTER — Ambulatory Visit
Admission: RE | Admit: 2019-09-28 | Discharge: 2019-09-28 | Disposition: A | Discharge status: Home / Self Care | Payer: Medicare Other | Source: Ambulatory Visit | Attending: Pulmonary Disease | Admitting: Pulmonary Disease

## 2019-09-28 ENCOUNTER — Other Ambulatory Visit (INDEPENDENT_AMBULATORY_CARE_PROVIDER_SITE_OTHER): Payer: Self-pay | Admitting: Nurse Practitioner

## 2019-09-28 DIAGNOSIS — E041 Nontoxic single thyroid nodule: Secondary | ICD-10-CM

## 2019-09-28 LAB — SARS CORONAVIRUS 2 (TAT 6-24 HRS): SARS Coronavirus 2: NEGATIVE

## 2019-09-28 MED ORDER — CEFAZOLIN SODIUM-DEXTROSE 1-4 GM/50ML-% IV SOLN
1.0000 g | Freq: Once | INTRAVENOUS | Status: AC
  Administered 2019-09-29: 14:00:00 1 g via INTRAVENOUS

## 2019-09-28 NOTE — Discharge Instructions (Signed)
Thyroid Needle Biopsy, Care After This sheet gives you information about how to care for yourself after your procedure. Your health care provider may also give you more specific instructions. If you have problems or questions, contact your health care provider. What can I expect after the procedure? After the procedure, it is common to have:  Soreness and tenderness that lasts for a few days.  Bruising where the needle was inserted (puncture site). Follow these instructions at home:   Take over-the-counter and prescription medicines only as told by your health care provider.  To help ease discomfort, keep your head raised (elevated) when you are lying down. When you move from lying down to sitting up, use both hands to support the back of your head and neck.  Check your puncture site every day for signs of infection. Check for: ? Redness, swelling, or pain. ? Fluid or blood. ? Warmth. ? Pus or a bad smell.  Return to your normal activities as told by your health care provider. Ask your health care provider what activities are safe for you.  Keep all follow-up visits as told by your health care provider. This is important. Contact a health care provider if:  You have redness, swelling, or pain around your puncture site.  You have fluid or blood coming from your puncture site.  Your puncture site feels warm to the touch.  You have pus or a bad smell coming from your puncture site.  You have a fever. Get help right away if:  You have severe bleeding from the puncture site.  You have difficulty swallowing.  You have swollen glands (lymph nodes) in your neck. Summary  It is common to have some bruising and soreness where the needle was inserted in your lower front neck area (puncture site).  Check your puncture site every day for signs of infection, such as redness, swelling, or pain.  Get help right away if you have severe bleeding from your puncture site. This  information is not intended to replace advice given to you by your health care provider. Make sure you discuss any questions you have with your health care provider. Document Released: 07/04/2014 Document Revised: 11/19/2017 Document Reviewed: 09/20/2017 Elsevier Patient Education  2020 [employer].

## 2019-09-28 NOTE — Telephone Encounter (Signed)
Patient was called and given the new arrival time for her procedure with Dr. Delana Meyer on 09/29/2019 of 11:30 am. Patient understood.

## 2019-09-28 NOTE — Procedures (Signed)
[place] Left thyroid biopsy without difficulty  Complications:  None  Blood Loss: none  See dictation in canopy pacs

## 2019-09-29 ENCOUNTER — Encounter: Payer: Self-pay | Admitting: *Deleted

## 2019-09-29 ENCOUNTER — Other Ambulatory Visit: Payer: Self-pay

## 2019-09-29 ENCOUNTER — Encounter
Admission: RE | Disposition: A | Discharge status: Home / Self Care | Payer: Self-pay | Source: Home / Self Care | Attending: Vascular Surgery

## 2019-09-29 ENCOUNTER — Ambulatory Visit
Admission: RE | Admit: 2019-09-29 | Discharge: 2019-09-29 | Disposition: A | Discharge status: Home / Self Care | Payer: Medicare Other | Attending: Vascular Surgery | Admitting: Vascular Surgery

## 2019-09-29 DIAGNOSIS — F17200 Nicotine dependence, unspecified, uncomplicated: Secondary | ICD-10-CM | POA: Insufficient documentation

## 2019-09-29 DIAGNOSIS — J449 Chronic obstructive pulmonary disease, unspecified: Secondary | ICD-10-CM | POA: Diagnosis not present

## 2019-09-29 DIAGNOSIS — M4722 Other spondylosis with radiculopathy, cervical region: Secondary | ICD-10-CM | POA: Diagnosis not present

## 2019-09-29 DIAGNOSIS — E1151 Type 2 diabetes mellitus with diabetic peripheral angiopathy without gangrene: Secondary | ICD-10-CM | POA: Diagnosis not present

## 2019-09-29 DIAGNOSIS — E1122 Type 2 diabetes mellitus with diabetic chronic kidney disease: Secondary | ICD-10-CM | POA: Diagnosis not present

## 2019-09-29 DIAGNOSIS — E1140 Type 2 diabetes mellitus with diabetic neuropathy, unspecified: Secondary | ICD-10-CM | POA: Insufficient documentation

## 2019-09-29 DIAGNOSIS — N186 End stage renal disease: Secondary | ICD-10-CM | POA: Insufficient documentation

## 2019-09-29 DIAGNOSIS — T82510D Breakdown (mechanical) of surgically created arteriovenous fistula, subsequent encounter: Secondary | ICD-10-CM | POA: Diagnosis not present

## 2019-09-29 DIAGNOSIS — E7800 Pure hypercholesterolemia, unspecified: Secondary | ICD-10-CM | POA: Diagnosis not present

## 2019-09-29 DIAGNOSIS — F329 Major depressive disorder, single episode, unspecified: Secondary | ICD-10-CM | POA: Insufficient documentation

## 2019-09-29 DIAGNOSIS — I998 Other disorder of circulatory system: Secondary | ICD-10-CM | POA: Diagnosis not present

## 2019-09-29 DIAGNOSIS — F419 Anxiety disorder, unspecified: Secondary | ICD-10-CM | POA: Insufficient documentation

## 2019-09-29 DIAGNOSIS — G458 Other transient cerebral ischemic attacks and related syndromes: Secondary | ICD-10-CM

## 2019-09-29 DIAGNOSIS — I120 Hypertensive chronic kidney disease with stage 5 chronic kidney disease or end stage renal disease: Secondary | ICD-10-CM | POA: Insufficient documentation

## 2019-09-29 DIAGNOSIS — Z992 Dependence on renal dialysis: Secondary | ICD-10-CM | POA: Insufficient documentation

## 2019-09-29 DIAGNOSIS — Y841 Kidney dialysis as the cause of abnormal reaction of the patient, or of later complication, without mention of misadventure at the time of the procedure: Secondary | ICD-10-CM | POA: Diagnosis not present

## 2019-09-29 DIAGNOSIS — T82898A Other specified complication of vascular prosthetic devices, implants and grafts, initial encounter: Secondary | ICD-10-CM | POA: Diagnosis not present

## 2019-09-29 HISTORY — PX: UPPER EXTREMITY ANGIOGRAPHY: CATH118270

## 2019-09-29 LAB — CYTOLOGY - NON PAP

## 2019-09-29 LAB — TROPONIN I (HIGH SENSITIVITY): Troponin I (High Sensitivity): 19 ng/L — ABNORMAL HIGH (ref <18)

## 2019-09-29 LAB — GLUCOSE, CAPILLARY
Glucose-Capillary: 191 mg/dL — ABNORMAL HIGH (ref 70–99)
Glucose-Capillary: 152 mg/dL — ABNORMAL HIGH (ref 70–99)

## 2019-09-29 LAB — POTASSIUM (ARMC VASCULAR LAB ONLY): Potassium (ARMC vascular lab): 4.7 (ref 3.5–5.1)

## 2019-09-29 SURGERY — UPPER EXTREMITY ANGIOGRAPHY
Anesthesia: Moderate Sedation | Site: Arm Upper | Laterality: Left

## 2019-09-29 MED ORDER — MORPHINE SULFATE (PF) 4 MG/ML IV SOLN: 2.0000 mg | INTRAVENOUS | Status: DC | PRN

## 2019-09-29 MED ORDER — LABETALOL HCL 5 MG/ML IV SOLN
10.0000 mg | INTRAVENOUS | Status: DC | PRN
  Administered 2019-09-29 (×2): 10 mg via INTRAVENOUS

## 2019-09-29 MED ORDER — ONDANSETRON HCL 4 MG/2ML IJ SOLN: 4.0000 mg | Freq: Four times a day (QID) | INTRAMUSCULAR | Status: DC | PRN

## 2019-09-29 MED ORDER — HYDROMORPHONE HCL 1 MG/ML IJ SOLN: 1.0000 mg | Freq: Once | INTRAMUSCULAR | Status: DC | PRN

## 2019-09-29 MED ORDER — OXYCODONE HCL 5 MG PO TABS: 5.0000 mg | ORAL_TABLET | ORAL | Status: DC | PRN

## 2019-09-29 MED ORDER — MIDAZOLAM HCL 5 MG/5ML IJ SOLN
INTRAMUSCULAR | Status: AC
  Filled 2019-09-29: qty 5

## 2019-09-29 MED ORDER — IODIXANOL 320 MG/ML IV SOLN
INTRAVENOUS | Status: DC | PRN
  Administered 2019-09-29: 110 mL via INTRA_ARTERIAL

## 2019-09-29 MED ORDER — DIPHENHYDRAMINE HCL 50 MG/ML IJ SOLN: 50.0000 mg | Freq: Once | INTRAMUSCULAR | Status: DC | PRN

## 2019-09-29 MED ORDER — FAMOTIDINE 20 MG PO TABS: 40.0000 mg | ORAL_TABLET | Freq: Once | ORAL | Status: DC | PRN

## 2019-09-29 MED ORDER — CEFAZOLIN SODIUM-DEXTROSE 1-4 GM/50ML-% IV SOLN
INTRAVENOUS | Status: AC
  Administered 2019-09-29: 1 g via INTRAVENOUS
  Filled 2019-09-29: qty 50

## 2019-09-29 MED ORDER — HEPARIN SODIUM (PORCINE) 1000 UNIT/ML IJ SOLN
INTRAMUSCULAR | Status: DC | PRN
  Administered 2019-09-29: 3000 [IU] via INTRAVENOUS

## 2019-09-29 MED ORDER — MIDAZOLAM HCL 2 MG/2ML IJ SOLN
INTRAMUSCULAR | Status: DC | PRN
  Administered 2019-09-29 (×2): 1 mg via INTRAVENOUS
  Administered 2019-09-29: 2 mg via INTRAVENOUS
  Administered 2019-09-29: 1 mg via INTRAVENOUS

## 2019-09-29 MED ORDER — FENTANYL CITRATE (PF) 100 MCG/2ML IJ SOLN
INTRAMUSCULAR | Status: DC | PRN
  Administered 2019-09-29 (×3): 25 ug via INTRAVENOUS
  Administered 2019-09-29: 50 ug via INTRAVENOUS
  Administered 2019-09-29: 25 ug via INTRAVENOUS

## 2019-09-29 MED ORDER — SODIUM CHLORIDE 0.9% FLUSH: 3.0000 mL | Freq: Two times a day (BID) | INTRAVENOUS | Status: DC

## 2019-09-29 MED ORDER — HYDRALAZINE HCL 20 MG/ML IJ SOLN
5.0000 mg | INTRAMUSCULAR | Status: DC | PRN
  Administered 2019-09-29: 18:00:00 5 mg via INTRAVENOUS

## 2019-09-29 MED ORDER — SODIUM CHLORIDE 0.9% FLUSH: 3.0000 mL | INTRAVENOUS | Status: DC | PRN

## 2019-09-29 MED ORDER — LABETALOL HCL 5 MG/ML IV SOLN
INTRAVENOUS | Status: AC
  Administered 2019-09-29: 10 mg via INTRAVENOUS
  Filled 2019-09-29: qty 4

## 2019-09-29 MED ORDER — SODIUM CHLORIDE 0.9 % IV SOLN: 250.0000 mL | INTRAVENOUS | Status: DC | PRN

## 2019-09-29 MED ORDER — SODIUM CHLORIDE 0.9 % IV SOLN: INTRAVENOUS | Status: DC

## 2019-09-29 MED ORDER — BACITRACIN-NEOMYCIN-POLYMYXIN 400-5-5000 EX OINT
TOPICAL_OINTMENT | CUTANEOUS | Status: AC
  Filled 2019-09-29: qty 1

## 2019-09-29 MED ORDER — ACETAMINOPHEN 325 MG PO TABS: 650.0000 mg | ORAL_TABLET | ORAL | Status: DC | PRN

## 2019-09-29 MED ORDER — FENTANYL CITRATE (PF) 100 MCG/2ML IJ SOLN
INTRAMUSCULAR | Status: AC
  Filled 2019-09-29: qty 2

## 2019-09-29 MED ORDER — MIDAZOLAM HCL 2 MG/ML PO SYRP: 8.0000 mg | ORAL_SOLUTION | Freq: Once | ORAL | Status: DC | PRN

## 2019-09-29 MED ORDER — HEPARIN SODIUM (PORCINE) 1000 UNIT/ML IJ SOLN
INTRAMUSCULAR | Status: AC
  Filled 2019-09-29: qty 1

## 2019-09-29 MED ORDER — HYDRALAZINE HCL 20 MG/ML IJ SOLN
INTRAMUSCULAR | Status: AC
  Administered 2019-09-29: 5 mg via INTRAVENOUS
  Filled 2019-09-29: qty 1

## 2019-09-29 MED ORDER — SODIUM CHLORIDE 0.9 % IV SOLN
INTRAVENOUS | Status: DC
  Administered 2019-09-29: 12:00:00 via INTRAVENOUS

## 2019-09-29 SURGICAL SUPPLY — 16 items
CATH ANGIO 5F 100CM .035 PIG (CATHETERS) ×3 IMPLANT
CATH BEACON 5 .035 100 H1 TIP (CATHETERS) ×3 IMPLANT
CATH SEEKER .035X135CM (CATHETERS) ×3 IMPLANT
DEVICE STARCLOSE SE CLOSURE (Vascular Products) ×3 IMPLANT
DEVICE TORQUE .025-.038 (MISCELLANEOUS) ×3 IMPLANT
GLIDEWIRE ANGLED SS 035X260CM (WIRE) ×3 IMPLANT
NEEDLE ENTRY 21GA 7CM ECHOTIP (NEEDLE) ×3 IMPLANT
PACK ANGIOGRAPHY (CUSTOM PROCEDURE TRAY) ×3 IMPLANT
SET INTRO CAPELLA COAXIAL (SET/KITS/TRAYS/PACK) ×3 IMPLANT
SHEATH BRITE TIP 5FRX11 (SHEATH) ×3 IMPLANT
SHEATH RAABE 6FRX70 (SHEATH) ×3 IMPLANT
SYR MEDRAD MARK 7 150ML (SYRINGE) ×3 IMPLANT
TUBING CONTRAST HIGH PRESS 72 (TUBING) ×3 IMPLANT
WIRE AMPLATZ SSTIFF .035X260CM (WIRE) ×3 IMPLANT
WIRE J 3MM .035X145CM (WIRE) ×3 IMPLANT
WIRE MAGIC TORQUE 315CM (WIRE) ×3 IMPLANT

## 2019-09-29 NOTE — Progress Notes (Signed)
Pt on room monitor with ST elevation noted. Raul Del P.A and Dr. Delana Meyer notified. Orders received for EKG and STAT troponin. 12 lead EKG complterd and MD/PA notified for review. 12 lead appears similar to May 20' 12 lead, but some ST changes noted. Awaiting labs. Pt denies CP or SOB. Will monitor closely.

## 2019-09-29 NOTE — H&P (Signed)
[facility] History & Physical Update  The patient was interviewed and re-examined.  The patient's previous History and Physical has been reviewed and is unchanged.  There is no change in the plan of care. We plan to proceed with the scheduled procedure.  Olivia Pilar, MD  09/29/2019, 2:00 PM

## 2019-09-29 NOTE — H&P (Signed)
[facility] History & Physical Update  The patient was interviewed and re-examined.  The patient's previous History and Physical has been reviewed and is unchanged.  There is no change in the plan of care. We plan to proceed with the scheduled procedure.  Hortencia Pilar, MD  09/29/2019, 11:23 AM

## 2019-09-29 NOTE — Progress Notes (Signed)
Hezzie Bump PA notified of Troponin results, per P.A will proceed with procedure.

## 2019-09-29 NOTE — Op Note (Signed)
[facility]  Percutaneous Study/Intervention Procedural Note   Date of Surgery: 09/29/2019,3:38 PM  Surgeon:Wilson, Dolores Lory   Pre-operative Diagnosis: Ischemia left third and fourth fingers; steal syndrome secondary to AV fistula; end-stage renal disease  Post-operative diagnosis:  Same  Procedure(s) Performed:  1.  Arch aortogram  2.  Left upper extremity angiography third order catheter placement  3.  Star close right common femoral arteriotomy    Anesthesia: Conscious sedation was administered by the interventional radiology RN under my direct supervision. IV Versed plus fentanyl were utilized. Continuous ECG, pulse oximetry and blood pressure was monitored throughout the entire procedure.  Conscious sedation was administered for a total of 50 minutes.  Sheath: 6 [place] right common femoral retrograde  Contrast: 110 cc   Fluoroscopy Time: 11.2 minutes  Indications:  The patient presents to to the office with increasing pain of the left third and fourth fingers.  Physical examination demonstrated there cyanotic with poor capillary refill.  Pulses at the wrist are nonpalpable.  This is consistent with steal syndrome secondary to her AV fistula of the left arm for dialysis access.  The patient is therefore undergoing angiography with the hope for intervention for resolution of steal syndrome.   Procedure:  [name] a 53 y.o. female who was identified and appropriate procedural time out was performed.  The patient was then placed supine on the table and prepped and draped in the usual sterile fashion.  Ultrasound was used to evaluate the right common femoral artery.  It was echolucent and pulsatile indicating it is patent .  An ultrasound image was acquired for the permanent record.  A micropuncture needle was used to access the right common femoral artery under direct ultrasound guidance.  The microwire was then advanced under fluoroscopic guidance  without difficulty followed by the micro-sheath.  A 0.035 J wire was advanced without resistance and a 5Fr sheath was placed.    Pigtail catheter was then advanced to the level of the proximal ascending aorta and an LAO projection of the aortic arch was obtained.  Using a Glidewire and an H1 catheter the subclavian artery was selected and the catheter was then advanced out to the level of the axillary artery.  Hand-injection contrast was used to demonstrate the axillary and proximal brachial artery.  A wire was then reintroduced and the catheter was advanced and the mid distal brachial artery was imaged by hand-injection.  Wire was then reintroduced and a seeker catheter was then used to select out the ulnar artery.  Magnified injection all the way to the hand was then obtained by hand-injection.  Seeker catheter was then removed and a long 6 [place] sheath was inserted and positioned with its tip right at the origin and magnified images in multiple views were obtained to ensure there was no ostial stenosis.  After review these images the sheath was pulled back into the external iliac.   StarClose device was deployed without difficulty.   Findings:   Arch aortogram: The arch aorta demonstrates bovine anatomy.  There are no ostial stenoses identified.  This includes magnified imaging with a sheath tip directly at the origin of the left subclavian.  Left upper extremity: The subclavian axillary and brachial arteries are widely patent.  Fistula is noted it is aneurysmal.  It does not appear to be particularly big at the anastomosis.  There is a stricture between the 2 dominant aneurysms.  Previously placed stents are noted in the subclavian vein.  The injection of contrast proximal to the anastomosis results in all contrast flowing into the fistula with no contrast visualized coursing distally to the hand.  Contrast injection by hand at the level of the trifurcation results in contrast flowing retrograde and  into the fistula and again no distal visualization is identified.  As the catheter is advanced to the level of the mid ulnar the ulnar and radial arteries are now well visualized.  The palmar arch is intact.  Digital arteries are intact.  Although the ulnar and radial arteries are somewhat small in caliber they are widely patent.  There is no evidence of a correctable stenosis however there is strong evidence of profound steal syndrome.   Disposition: Patient was taken to the recovery room in stable condition having tolerated the procedure well.  [name] 09/29/2019,3:38 PM

## 2019-09-30 DIAGNOSIS — N2581 Secondary hyperparathyroidism of renal origin: Secondary | ICD-10-CM | POA: Diagnosis not present

## 2019-09-30 DIAGNOSIS — N186 End stage renal disease: Secondary | ICD-10-CM | POA: Diagnosis not present

## 2019-09-30 DIAGNOSIS — Z992 Dependence on renal dialysis: Secondary | ICD-10-CM | POA: Diagnosis not present

## 2019-09-30 DIAGNOSIS — D631 Anemia in chronic kidney disease: Secondary | ICD-10-CM | POA: Diagnosis not present

## 2019-09-30 DIAGNOSIS — D509 Iron deficiency anemia, unspecified: Secondary | ICD-10-CM | POA: Diagnosis not present

## 2019-10-02 ENCOUNTER — Encounter: Payer: Self-pay | Admitting: Vascular Surgery

## 2019-10-02 ENCOUNTER — Telehealth: Payer: Self-pay | Admitting: Pulmonary Disease

## 2019-10-02 DIAGNOSIS — D631 Anemia in chronic kidney disease: Secondary | ICD-10-CM | POA: Diagnosis not present

## 2019-10-02 DIAGNOSIS — N186 End stage renal disease: Secondary | ICD-10-CM | POA: Diagnosis not present

## 2019-10-02 DIAGNOSIS — D509 Iron deficiency anemia, unspecified: Secondary | ICD-10-CM | POA: Diagnosis not present

## 2019-10-02 DIAGNOSIS — N2581 Secondary hyperparathyroidism of renal origin: Secondary | ICD-10-CM | POA: Diagnosis not present

## 2019-10-02 DIAGNOSIS — Z992 Dependence on renal dialysis: Secondary | ICD-10-CM | POA: Diagnosis not present

## 2019-10-02 NOTE — Telephone Encounter (Signed)
Pt is requesting tyroid bx results.  Dr. Patsey Berthold please advise. Thanks

## 2019-10-03 NOTE — Telephone Encounter (Signed)
Biopsy was benign. No cancer.

## 2019-10-03 NOTE — Telephone Encounter (Signed)
Pt is aware of results and voiced her understanding. Pt is questioning if she should have this nodule removed since it has grown.  Per LG verbally- no, nodule will need to be monitored. Pt is aware and voiced her understanding.

## 2019-10-04 DIAGNOSIS — N2581 Secondary hyperparathyroidism of renal origin: Secondary | ICD-10-CM | POA: Diagnosis not present

## 2019-10-04 DIAGNOSIS — D631 Anemia in chronic kidney disease: Secondary | ICD-10-CM | POA: Diagnosis not present

## 2019-10-04 DIAGNOSIS — N186 End stage renal disease: Secondary | ICD-10-CM | POA: Diagnosis not present

## 2019-10-04 DIAGNOSIS — D509 Iron deficiency anemia, unspecified: Secondary | ICD-10-CM | POA: Diagnosis not present

## 2019-10-04 DIAGNOSIS — Z992 Dependence on renal dialysis: Secondary | ICD-10-CM | POA: Diagnosis not present

## 2019-10-06 DIAGNOSIS — N186 End stage renal disease: Secondary | ICD-10-CM | POA: Diagnosis not present

## 2019-10-06 DIAGNOSIS — Z992 Dependence on renal dialysis: Secondary | ICD-10-CM | POA: Diagnosis not present

## 2019-10-06 DIAGNOSIS — D509 Iron deficiency anemia, unspecified: Secondary | ICD-10-CM | POA: Diagnosis not present

## 2019-10-06 DIAGNOSIS — N2581 Secondary hyperparathyroidism of renal origin: Secondary | ICD-10-CM | POA: Diagnosis not present

## 2019-10-06 DIAGNOSIS — D631 Anemia in chronic kidney disease: Secondary | ICD-10-CM | POA: Diagnosis not present

## 2019-10-09 ENCOUNTER — Other Ambulatory Visit: Payer: Self-pay

## 2019-10-09 ENCOUNTER — Ambulatory Visit (INDEPENDENT_AMBULATORY_CARE_PROVIDER_SITE_OTHER): Payer: Medicare Other | Admitting: Vascular Surgery

## 2019-10-09 ENCOUNTER — Encounter (INDEPENDENT_AMBULATORY_CARE_PROVIDER_SITE_OTHER): Payer: Self-pay | Admitting: Vascular Surgery

## 2019-10-09 VITALS — BP 171/78 | HR 81 | Resp 12 | Ht 67.0 in | Wt 226.0 lb

## 2019-10-09 DIAGNOSIS — T829XXS Unspecified complication of cardiac and vascular prosthetic device, implant and graft, sequela: Secondary | ICD-10-CM | POA: Diagnosis not present

## 2019-10-09 DIAGNOSIS — E1122 Type 2 diabetes mellitus with diabetic chronic kidney disease: Secondary | ICD-10-CM

## 2019-10-09 DIAGNOSIS — N186 End stage renal disease: Secondary | ICD-10-CM | POA: Diagnosis not present

## 2019-10-09 DIAGNOSIS — D509 Iron deficiency anemia, unspecified: Secondary | ICD-10-CM | POA: Diagnosis not present

## 2019-10-09 DIAGNOSIS — D631 Anemia in chronic kidney disease: Secondary | ICD-10-CM | POA: Diagnosis not present

## 2019-10-09 DIAGNOSIS — I739 Peripheral vascular disease, unspecified: Secondary | ICD-10-CM

## 2019-10-09 DIAGNOSIS — M5030 Other cervical disc degeneration, unspecified cervical region: Secondary | ICD-10-CM

## 2019-10-09 DIAGNOSIS — I10 Essential (primary) hypertension: Secondary | ICD-10-CM | POA: Diagnosis not present

## 2019-10-09 DIAGNOSIS — N2581 Secondary hyperparathyroidism of renal origin: Secondary | ICD-10-CM | POA: Diagnosis not present

## 2019-10-09 DIAGNOSIS — Z992 Dependence on renal dialysis: Secondary | ICD-10-CM | POA: Diagnosis not present

## 2019-10-09 NOTE — Progress Notes (Signed)
MRN : [id number]  [name] is a 53 y.o. (10/13/1966) female who presents with chief complaint of  Chief Complaint  Patient presents with   Follow-up  .  History of Present Illness:   The patient returns to the office for followup and review status post angiogram without intervention.   She has no complaints relative to her groin and the access site.  No new ulcers or wounds have occurred since the last visit.  She continues to have pain in the third fourth and fifth fingers of the left hand.  When she is off dialysis it is significantly less and relatively speaking tolerable however on dialysis she does experience a significant increase and has been hanging her hand off the armrest during dialysis because that makes it feel better.  There are no issues with her dialysis access.  The patient denies claudication.  She does have neuropathy of both feet and this is relatively unchanged for many years.  No open wounds or sores.  There have been no significant changes to the patient's overall health care.  The patient denies amaurosis fugax or recent TIA symptoms. There are no recent neurological changes noted. The patient denies history of DVT, PE or superficial thrombophlebitis. The patient denies recent episodes of angina or shortness of breath.     Current Meds  Medication Sig   Adalimumab (HUMIRA) 40 MG/0.4ML PSKT Inject 40 mg into the skin every 14 (fourteen) days.   amitriptyline (ELAVIL) 25 MG tablet Take 25 mg by mouth at bedtime.   atorvastatin (LIPITOR) 20 MG tablet TAKE ONE TABLET BY MOUTH ONCE DAILY (Patient taking differently: Take 20 mg by mouth at bedtime. )   calcium acetate (PHOSLO) 667 MG capsule Take 1,334 mg by mouth 3 (three) times daily with meals.    diltiazem (CARDIZEM) 60 MG tablet Take 1 tablet (60 mg total) by mouth 3 (three) times daily.   glucose blood (TRUE METRIX BLOOD GLUCOSE TEST) test strip Use as instructed   insulin aspart (NOVOLOG) 100  UNIT/ML injection Inject 1-2 Units into the skin 3 (three) times daily before meals. (Patient taking differently: Inject 2 Units into the skin 3 (three) times daily before meals. )   insulin glargine (LANTUS) 100 UNIT/ML injection Inject 0.1 mLs (10 Units total) into the skin daily.   ketoconazole (NIZORAL) 2 % cream APPLY TOPICALLY ONTO THE SKIN TWICE A DAY AS NEEDED (Patient taking differently: Apply 1 application topically daily. Applied to feet)   levocetirizine (XYZAL) 5 MG tablet TAKE ONE TABLET BY MOUTH EVERY EVENING (Patient taking differently: Take 5 mg by mouth every evening. )   omeprazole (PRILOSEC) 40 MG capsule Take 40 mg by mouth daily.   oxyCODONE (OXY IR/ROXICODONE) 5 MG immediate release tablet Take 1 tablet (5 mg total) by mouth 2 (two) times daily as needed for severe pain.   torsemide (DEMADEX) 100 MG tablet Take 100 mg by mouth daily.   TRUEPLUS INSULIN SYRINGE 29G X 1/2" 0.5 ML MISC Use 4 times a day as directed per provider.   VENTOLIN HFA 108 (90 Base) MCG/ACT inhaler Inhale 2 puffs into the lungs 4 (four) times daily as needed for wheezing or shortness of breath.     Past Medical History:  Diagnosis Date   Allergy    Anemia    Anxiety    worse when not at home - bowel incontinence   Arthritis    Ataxia    COPD (chronic obstructive pulmonary disease) ([place])  Depression    Diabetes mellitus with complication ([place])    ESRD on hemodialysis ([place])    MWF dialysis   Fistula    left upper arm   Hepatitis 2003   Hep C   Hiatal hernia    Hypercholesterolemia    Hypertension    Migraine    Neuropathy, diabetic (HCC)    lower legs   Peripheral vascular disease ([place])    Pneumonia 2015   Psoriasis    Renal insufficiency    Tobacco dependence    Wears dentures    full upper, partial lower    Past Surgical History:  Procedure Laterality Date   A/V SHUNT INTERVENTION N/A 12/16/2017   Procedure: A/V SHUNT INTERVENTION;  Surgeon:  Algernon Huxley, MD;  Location: [facility];  Service: Cardiovascular;  Laterality: N/A;   AV FISTULA PLACEMENT Left 11/2014   CESAREAN SECTION     CHOLECYSTECTOMY     COLONOSCOPY N/A 09/22/2017   Procedure: COLONOSCOPY;  Surgeon: Lin Landsman, MD;  Location: [facility];  Service: Endoscopy;  Laterality: N/A;   cyst removed  from left hand Left 1989   DIALYSIS/PERMA CATHETER INSERTION N/A 05/20/2017   Procedure: Dialysis/Perma Catheter Insertion and fistulagram/LUE angiogram;  Surgeon: Algernon Huxley, MD;  Location: [facility];  Service: Cardiovascular;  Laterality: N/A;   ESOPHAGOGASTRODUODENOSCOPY N/A 09/22/2017   Procedure: ESOPHAGOGASTRODUODENOSCOPY (EGD);  Surgeon: Lin Landsman, MD;  Location: [facility];  Service: Endoscopy;  Laterality: N/A;   INCISION AND DRAINAGE ABSCESS N/A 07/16/2016   Procedure: INCISION AND DRAINAGE ABSCESS;  Surgeon: Carloyn Manner, MD;  Location: [facility];  Service: ENT;  Laterality: N/A;   PERIPHERAL VASCULAR CATHETERIZATION N/A 07/25/2015   Procedure: A/V Shuntogram/Fistulagram;  Surgeon: Algernon Huxley, MD;  Location: [name];  Service: Cardiovascular;  Laterality: N/A;   PERIPHERAL VASCULAR CATHETERIZATION Left 07/25/2015   Procedure: A/V Shunt Intervention;  Surgeon: Algernon Huxley, MD;  Location: [facility];  Service: Cardiovascular;  Laterality: Left;   PERIPHERAL VASCULAR CATHETERIZATION Left 10/07/2015   Procedure: A/V Shuntogram/Fistulagram;  Surgeon: Algernon Huxley, MD;  Location: [place] [facility];  Service: Cardiovascular;  Laterality: Left;   PERIPHERAL VASCULAR CATHETERIZATION N/A 10/07/2015   Procedure: A/V Shunt Intervention;  Surgeon: Algernon Huxley, MD;  Location: [facility];  Service: Cardiovascular;  Laterality: N/A;   PERIPHERAL VASCULAR CATHETERIZATION  10/07/2015   Procedure: Dialysis/Perma Catheter Insertion;  Surgeon: Algernon Huxley, MD;  Location: [facility];  Service: Cardiovascular;;   PERIPHERAL VASCULAR CATHETERIZATION N/A 12/17/2015   Procedure: Dialysis/Perma Catheter Removal;  Surgeon: Katha Cabal, MD;  Location: [facility];  Service: Cardiovascular;  Laterality: N/A;   PERIPHERAL VASCULAR CATHETERIZATION N/A 01/11/2017   Procedure: Visceral Angiography;  Surgeon: Algernon Huxley, MD;  Location: [facility];  Service: Cardiovascular;  Laterality: N/A;   PERIPHERAL VASCULAR CATHETERIZATION N/A 01/11/2017   Procedure: Visceral Artery Intervention;  Surgeon: Algernon Huxley, MD;  Location: [facility];  Service: Cardiovascular;  Laterality: N/A;   POLYPECTOMY  09/22/2017   Procedure: POLYPECTOMY;  Surgeon: Lin Landsman, MD;  Location: [facility];  Service: Endoscopy;;   rt. tubal and ovary removed     STENT PLACEMENT VASCULAR ([place] [facility]) Left 03/2018   Performed at [facility] using EV3 protg GPS stent graph [id number] lot [id number]   TONSILLECTOMY     TUBAL LIGATION  UPPER EXTREMITY ANGIOGRAPHY Left 09/29/2019   Procedure: UPPER EXTREMITY ANGIOGRAPHY;  Surgeon: Katha Cabal, MD;  Location: [facility];  Service: Cardiovascular;  Laterality: Left;    Social History Social History   Tobacco Use   Smoking status: Light Tobacco Smoker    Packs/day: 0.25    Years: 20.00    Pack years: 5.00    Types: Cigarettes    Last attempt to quit: 05/21/2017    Years since quitting: 2.3   Smokeless tobacco: Current User  Substance Use Topics   Alcohol use: No   Drug use: No    Family History Family History  Problem Relation Age of Onset   Hypertension Mother    Heart disease Mother    Hypertension Father    Skin cancer Father     Allergies  Allergen Reactions   Cinnamon Anaphylaxis   Garlic Anaphylaxis and Hives   Onion Hives and Swelling   Tylenol [Acetaminophen] Anaphylaxis   Ciprofloxacin Diarrhea   Prednisone Other (See Comments)     Vaginal blisters & oral blisters      REVIEW OF SYSTEMS (Negative unless checked)  Constitutional: [] Weight loss  [] Fever  [] Chills Cardiac: [] Chest pain   [] Chest pressure   [] Palpitations   [] Shortness of breath when laying flat   [] Shortness of breath with exertion. Vascular:  [] Pain in legs with walking   [] Pain in legs at rest  [] History of DVT   [] Phlebitis   [x] Swelling in legs   [x] Varicose veins   [] Non-healing ulcers Pulmonary:   [] Uses home oxygen   [] Productive cough   [] Hemoptysis   [] Wheeze  [] COPD   [] Asthma Neurologic:  [] Dizziness   [] Seizures   [] History of stroke   [] History of TIA  [] Aphasia   [] Vissual changes   [] Weakness or numbness in arm   [] Weakness or numbness in leg Musculoskeletal:   [] Joint swelling   [x] Joint pain   [] Low back pain Hematologic:  [] Easy bruising  [] Easy bleeding   [] Hypercoagulable state   [] Anemic Gastrointestinal:  [] Diarrhea   [] Vomiting  [] Gastroesophageal reflux/heartburn   [] Difficulty swallowing. Genitourinary:  [x] Chronic kidney disease   [] Difficult urination  [] Frequent urination   [] Blood in urine Skin:  [] Rashes   [] Ulcers  Psychological:  [] History of anxiety   []  History of major depression.  Physical Examination  Vitals:   10/09/19 0841  BP: (!) 171/78  Pulse: 81  Resp: 12  Weight: 226 lb (102.5 kg)  Height: 5\' 7"  (1.702 m)   Body mass index is 35.4 kg/m. Gen: WD/WN, NAD Head: [place]/AT, No temporalis wasting.  Ear/Nose/Throat: Hearing grossly intact, nares w/o erythema or drainage Eyes: PER, EOMI, sclera nonicteric.  Neck: Supple, no large masses.   Pulmonary:  Good air movement, no audible wheezing bilaterally, no use of accessory muscles.  Cardiac: RRR, no JVD Vascular: Fistula with good thrill good bruit Vessel Right Left  Radial  not palpable  not palpable  Ulnar  not palpable  not palpable  Brachial Palpable Palpable  Gastrointestinal: Non-distended. No guarding/no peritoneal signs.  Musculoskeletal: M/S  5/5 throughout.  No deformity or atrophy.  Neurologic: CN 2-12 intact. Symmetrical.  Speech is fluent. Motor exam as listed above. Psychiatric: Judgment intact, Mood & affect appropriate for pt's clinical situation. Dermatologic: No rashes or ulcers noted.  No changes consistent with cellulitis. Lymph : No lichenification or skin changes of chronic lymphedema.  CBC Lab Results  Component Value Date   WBC 6.4 05/01/2019   HGB 9.3 (L)  05/01/2019   HCT 27.8 (L) 05/01/2019   MCV 91.4 05/01/2019   PLT 78 (L) 05/01/2019    BMET    Component Value Date/Time   NA 128 (L) 05/01/2019 0015   NA 136 (A) 12/08/2017   NA 133 (L) 10/31/2014 0955   K 5.2 (H) 05/01/2019 0015   K 4.7 10/31/2014 0955   CL 96 (L) 05/01/2019 0015   CL 102 10/31/2014 0955   CO2 21 (L) 05/01/2019 0015   CO2 27 10/31/2014 0955   GLUCOSE 152 (H) 05/01/2019 0015   GLUCOSE 203 (H) 10/31/2014 0955   BUN 52 (H) 05/01/2019 0015   BUN 35 (H) 10/31/2014 0955   CREATININE 7.50 (H) 05/01/2019 0015   CREATININE 7.43 (H) 12/27/2017 1035   CALCIUM 7.7 (L) 05/01/2019 0015   CALCIUM 7.7 (L) 10/31/2014 0955   GFRNONAA 6 (L) 05/01/2019 0015   GFRNONAA 6 (L) 12/27/2017 1035   GFRAA 6 (L) 05/01/2019 0015   GFRAA 7 (L) 12/27/2017 1035   CrCl cannot be calculated (Patient's most recent lab result is older than the maximum 21 days allowed.).  COAG Lab Results  Component Value Date   INR 1.2 04/30/2019   INR 1.05 04/07/2018   INR 1.01 10/12/2017    Radiology [place] Fna Bx Thyroid 1st Lesion Afirma  Result Date: 09/28/2019 INDICATION: Indeterminate thyroid nodule EXAM: ULTRASOUND GUIDED FINE NEEDLE ASPIRATION OF INDETERMINATE THYROID NODULE COMPARISON:  09/12/2019 MEDICATIONS: None COMPLICATIONS: None immediate. TECHNIQUE: Informed written consent was obtained from the patient after a discussion of the risks, benefits and alternatives to treatment. Questions regarding the procedure were encouraged and answered. A timeout was  performed prior to the initiation of the procedure. Pre-procedural ultrasound scanning demonstrated unchanged size and appearance of the indeterminate nodule within the left lobe The procedure was planned. The neck was prepped in the usual sterile fashion, and a sterile drape was applied covering the operative field. A timeout was performed prior to the initiation of the procedure. Local anesthesia was provided with 1% lidocaine. Under direct ultrasound guidance, 4 FNA biopsies were performed of the left mid thyroid nodule with a 25 gauge needle. Multiple ultrasound images were saved for procedural documentation purposes. The samples were prepared and submitted to pathology. Additionally 2 further FNA biopsies were obtained for Afirma testing. Limited post procedural scanning was negative for hematoma or additional complication. Dressings were placed. The patient tolerated the above procedures procedure well without immediate postprocedural complication. FINDINGS: FINDINGS Nodule reference number based on prior diagnostic ultrasound: 1 Maximum size: 2.2 cm Location: Left;  mid ACR TI-RADS total points: 8 ACR TI-RADS risk category:  TR5 Prior biopsy:  No Reason for biopsy: meets ACR TI-RADS criteria Ultrasound imaging confirms appropriate placement of the needles within the thyroid nodule. IMPRESSION: Technically successful ultrasound guided fine needle aspiration of left mid thyroid nodule Electronically Signed   By: Inez Catalina M.D.   On: 09/28/2019 15:14   Vas US Duplex Dialysis Access (avf,avg)  Result Date: 09/21/2019 DIALYSIS ACCESS Reason for Exam: Complication of AVF. Access Site: Left Upper Extremity. Access Type: Brachial-cephalic AVF. Performing Technologist: Charlane Ferretti RT (R)(VS) Supporting Technologist: Blondell Reveal RT, RDMS, RVT  Examination Guidelines: A complete evaluation includes B-mode imaging, spectral Doppler, color Doppler, and power Doppler as needed of all accessible portions of  each vessel. Unilateral testing is considered an integral part of a complete examination. Limited examinations for reoccurring indications may be performed as noted.  Findings: +--------------------+----------+-----------------+--------+  AVF  PSV (cm/s) Flow Vol (mL/min) Comments  +--------------------+----------+-----------------+--------+  Native artery inflow    181           1544                  +--------------------+----------+-----------------+--------+  AVF Anastomosis         419                                 +--------------------+----------+-----------------+--------+  +---------------+----------+-------------+----------+--------------------------+  OUTFLOW VEIN    PSV (cm/s) Diameter (cm) Depth (cm)          Describe           +---------------+----------+-------------+----------+--------------------------+  Subclavian vein    167                                        stent             +---------------+----------+-------------+----------+--------------------------+  [place]          95                                        stent             +---------------+----------+-------------+----------+--------------------------+  Clavicle           304                                        stent             +---------------+----------+-------------+----------+--------------------------+  Prox UA            290                                                          +---------------+----------+-------------+----------+--------------------------+  Mid UA             185         2.06                                             +---------------+----------+-------------+----------+--------------------------+  Dist UA            223                                                          +---------------+----------+-------------+----------+--------------------------+  AC Fossa           671                              vessel tortuosity at area  of increased velocity at                                                              proximal ACF;                                                                 non-hemodynamically                                                             significant echogenic                                                              thrombus closer to                                                                  anastomosis          +---------------+----------+-------------+----------+--------------------------+  Focal cystic area, with no internal flow, noted at the medial antecubital fossa level measuring 0.39 x 0.39 x 0.44cm. Antegrade flow in the left distal radial and ulnar arteries. At rest: Left radial artery = 51cm/s; Left ulnar artery = 48cm/s During AVF compression = Left radial artery = 93cm/s; Left ulnar artery = 62cm/s  Summary: Patent left upper arm AVF with elevated velocity at the proximal antecubital fossa/distal upper arm outflow vein level, as described above. No significant velocity increase throughout the remainder of the outflow vein. Increased velocities of the distal radial and ulnar arteries during fistula compression however they are not indicative with a significant fistula steal.  *See table(s) above for measurements and observations.  Diagnosing physician: Hortencia Pilar MD Electronically signed by Hortencia Pilar MD on 09/21/2019 at 4:55:56 PM.   --------------------------------------------------------------------------------   Final    US Thyroid  Result Date: 09/12/2019 CLINICAL DATA:  Nodule noted on CT chest. EXAM: THYROID ULTRASOUND TECHNIQUE: Ultrasound examination of the thyroid gland and adjacent soft tissues was performed. COMPARISON:  CT 08/18/2019 FINDINGS: Parenchymal Echotexture: Mildly heterogenous Isthmus: 0.8 cm thickness Right lobe: 5.6 x 1.6 x 1.9 cm Left lobe: 4.7 x 2.3 x 2.5 cm _________________________________________________________ Estimated total number of  nodules >/= 1 cm: 1 Number of spongiform nodules >/=  2 cm not described below (TR1): 0 Number of mixed cystic and solid nodules >/= 1.5 cm not described below (TR2): 0 _________________________________________________________ Nodule # 1: Location: Left; Mid Maximum size: 2.2 cm; Other 2 dimensions: 2.2 x 2 cm Composition: mixed cystic  and solid (1) Echogenicity: isoechoic (1) Shape: taller-than-wide (3) Margins: ill-defined (0) Echogenic foci: punctate echogenic foci (3) ACR TI-RADS total points: 8. ACR TI-RADS risk category: TR5 (>/= 7 points). ACR TI-RADS recommendations: **Given size (>/= 1.0 cm) and appearance, fine needle aspiration of this highly suspicious nodule should be considered based on TI-RADS criteria. _________________________________________________________ Bilateral morphologically unremarkable cervical lymph nodes incidentally noted, none greater than 0.6 cm short axis diameter. IMPRESSION: 1. Mild thyromegaly with solitary 2.2 cm suspicious mid left nodule. Recommend FNA biopsy. The above is in keeping with the ACR TI-RADS recommendations - J Am Coll Radiol 2017;14:587-595. Electronically Signed   By: Lucrezia Europe M.D.   On: 09/12/2019 14:05     Assessment/Plan 1. Complication from renal dialysis device, sequela The patient has evidence of steal syndrome secondary to her fistula which is cooperated with the angiographic findings.  I did show the patient and review the angiogram with her.  There are 4 options that I discussed in detail with her: 1.  Patient could undergo banding of the fistula however the fistula at the level of the arterial anastomosis is already relatively speaking quite small and so I think the likelihood of being able to band her fistula and allowing the fistula to remain patent would be quite low.  I did bring this option up as a secondary issue if we were to commit to the option of creating access in the right arm. 2.  I discussed a drill procedure.  I noted that  we would need to perform vein mapping of her saphenous vein to see if this type of surgery is feasible. 3.  I discussed the option of ligating the left brachiocephalic fistula and creating access in the right arm. 4.  I discussed the option of continuing to use the access and dealing with the steal symptoms.  Currently she is tolerating the steal syndrome quite well when she is off dialysis.  This was a large amount of information and she wishes to consider some of these options but does not want to commit to any particular plan today.  Her fingers are pink and warm today there are no ulcerations or ischemic fissures so I feel that this is a very viable plan.  I will ask that she come back in 3 months rather than 6 months just to check and see how things are going.   A total of 35 minutes was spent with this patient and greater than 50% was spent in counseling and coordination of care with the patient.  Discussion included the treatment options for vascular disease including indications for surgery and intervention.  Also discussed is the appropriate timing of treatment.  In addition medical therapy was discussed.  2. PAD (peripheral artery disease) (HCC)  Recommend:  The patient has evidence of atherosclerosis of the lower extremities with claudication.  The patient does not voice lifestyle limiting changes at this point in time.  Noninvasive studies do not suggest clinically significant change.  No invasive studies, angiography or surgery at this time The patient should continue walking and begin a more formal exercise program.  The patient should continue antiplatelet therapy and aggressive treatment of the lipid abnormalities  No changes in the patient's medications at this time  The patient should continue wearing graduated compression socks 10-15 mmHg strength to control the mild edema.   - VAS [place] ABI WITH/WO TBI; Future  3. ESRD (end stage renal disease) on dialysis ([place]) At the  present time the patient  has adequate dialysis access.  Continue hemodialysis as ordered without interruption.  Avoid nephrotoxic medications and dehydration.  Further plans per nephrology  4. Essential hypertension Continue antihypertensive medications as already ordered, these medications have been reviewed and there are no changes at this time.   5. Type 2 diabetes mellitus with ESRD (end-stage renal disease) ([place]) Continue hypoglycemic medications as already ordered, these medications have been reviewed and there are no changes at this time.  Hgb A1C to be monitored as already arranged by primary service   6. DDD (degenerative disc disease), cervical Continue NSAID medications as already ordered, these medications have been reviewed and there are no changes at this time.  Continued activity and therapy was stressed.     Hortencia Pilar, MD  10/09/2019 8:48 AM

## 2019-10-11 DIAGNOSIS — D631 Anemia in chronic kidney disease: Secondary | ICD-10-CM | POA: Diagnosis not present

## 2019-10-11 DIAGNOSIS — N186 End stage renal disease: Secondary | ICD-10-CM | POA: Diagnosis not present

## 2019-10-11 DIAGNOSIS — N2581 Secondary hyperparathyroidism of renal origin: Secondary | ICD-10-CM | POA: Diagnosis not present

## 2019-10-11 DIAGNOSIS — D509 Iron deficiency anemia, unspecified: Secondary | ICD-10-CM | POA: Diagnosis not present

## 2019-10-11 DIAGNOSIS — Z992 Dependence on renal dialysis: Secondary | ICD-10-CM | POA: Diagnosis not present

## 2019-10-13 DIAGNOSIS — Z992 Dependence on renal dialysis: Secondary | ICD-10-CM | POA: Diagnosis not present

## 2019-10-13 DIAGNOSIS — D509 Iron deficiency anemia, unspecified: Secondary | ICD-10-CM | POA: Diagnosis not present

## 2019-10-13 DIAGNOSIS — N2581 Secondary hyperparathyroidism of renal origin: Secondary | ICD-10-CM | POA: Diagnosis not present

## 2019-10-13 DIAGNOSIS — D631 Anemia in chronic kidney disease: Secondary | ICD-10-CM | POA: Diagnosis not present

## 2019-10-13 DIAGNOSIS — N186 End stage renal disease: Secondary | ICD-10-CM | POA: Diagnosis not present

## 2019-10-16 DIAGNOSIS — E119 Type 2 diabetes mellitus without complications: Secondary | ICD-10-CM | POA: Diagnosis not present

## 2019-10-16 DIAGNOSIS — N2581 Secondary hyperparathyroidism of renal origin: Secondary | ICD-10-CM | POA: Diagnosis not present

## 2019-10-16 DIAGNOSIS — N186 End stage renal disease: Secondary | ICD-10-CM | POA: Diagnosis not present

## 2019-10-16 DIAGNOSIS — Z794 Long term (current) use of insulin: Secondary | ICD-10-CM | POA: Diagnosis not present

## 2019-10-16 DIAGNOSIS — Z992 Dependence on renal dialysis: Secondary | ICD-10-CM | POA: Diagnosis not present

## 2019-10-16 DIAGNOSIS — D631 Anemia in chronic kidney disease: Secondary | ICD-10-CM | POA: Diagnosis not present

## 2019-10-16 DIAGNOSIS — D509 Iron deficiency anemia, unspecified: Secondary | ICD-10-CM | POA: Diagnosis not present

## 2019-10-17 DIAGNOSIS — H4312 Vitreous hemorrhage, left eye: Secondary | ICD-10-CM | POA: Diagnosis not present

## 2019-10-18 DIAGNOSIS — N2581 Secondary hyperparathyroidism of renal origin: Secondary | ICD-10-CM | POA: Diagnosis not present

## 2019-10-18 DIAGNOSIS — N186 End stage renal disease: Secondary | ICD-10-CM | POA: Diagnosis not present

## 2019-10-18 DIAGNOSIS — D509 Iron deficiency anemia, unspecified: Secondary | ICD-10-CM | POA: Diagnosis not present

## 2019-10-18 DIAGNOSIS — Z992 Dependence on renal dialysis: Secondary | ICD-10-CM | POA: Diagnosis not present

## 2019-10-18 DIAGNOSIS — D631 Anemia in chronic kidney disease: Secondary | ICD-10-CM | POA: Diagnosis not present

## 2019-10-20 ENCOUNTER — Other Ambulatory Visit: Payer: Self-pay | Admitting: Nurse Practitioner

## 2019-10-20 DIAGNOSIS — N186 End stage renal disease: Secondary | ICD-10-CM | POA: Diagnosis not present

## 2019-10-20 DIAGNOSIS — N2581 Secondary hyperparathyroidism of renal origin: Secondary | ICD-10-CM | POA: Diagnosis not present

## 2019-10-20 DIAGNOSIS — IMO0002 Reserved for concepts with insufficient information to code with codable children: Secondary | ICD-10-CM

## 2019-10-20 DIAGNOSIS — E1165 Type 2 diabetes mellitus with hyperglycemia: Secondary | ICD-10-CM

## 2019-10-20 DIAGNOSIS — D631 Anemia in chronic kidney disease: Secondary | ICD-10-CM | POA: Diagnosis not present

## 2019-10-20 DIAGNOSIS — Z992 Dependence on renal dialysis: Secondary | ICD-10-CM | POA: Diagnosis not present

## 2019-10-20 DIAGNOSIS — D509 Iron deficiency anemia, unspecified: Secondary | ICD-10-CM | POA: Diagnosis not present

## 2019-10-20 DIAGNOSIS — E118 Type 2 diabetes mellitus with unspecified complications: Secondary | ICD-10-CM

## 2019-10-21 DIAGNOSIS — N186 End stage renal disease: Secondary | ICD-10-CM | POA: Diagnosis not present

## 2019-10-21 DIAGNOSIS — Z992 Dependence on renal dialysis: Secondary | ICD-10-CM | POA: Diagnosis not present

## 2019-10-22 ENCOUNTER — Other Ambulatory Visit: Payer: Self-pay | Admitting: Family Medicine

## 2019-10-22 DIAGNOSIS — N186 End stage renal disease: Secondary | ICD-10-CM | POA: Diagnosis not present

## 2019-10-22 DIAGNOSIS — N2581 Secondary hyperparathyroidism of renal origin: Secondary | ICD-10-CM | POA: Diagnosis not present

## 2019-10-22 DIAGNOSIS — Z992 Dependence on renal dialysis: Secondary | ICD-10-CM | POA: Diagnosis not present

## 2019-10-22 DIAGNOSIS — J00 Acute nasopharyngitis [common cold]: Secondary | ICD-10-CM

## 2019-10-22 DIAGNOSIS — T82868A Thrombosis of vascular prosthetic devices, implants and grafts, initial encounter: Secondary | ICD-10-CM | POA: Diagnosis not present

## 2019-10-22 DIAGNOSIS — D631 Anemia in chronic kidney disease: Secondary | ICD-10-CM | POA: Diagnosis not present

## 2019-10-22 DIAGNOSIS — D509 Iron deficiency anemia, unspecified: Secondary | ICD-10-CM | POA: Diagnosis not present

## 2019-10-23 DIAGNOSIS — D631 Anemia in chronic kidney disease: Secondary | ICD-10-CM | POA: Diagnosis not present

## 2019-10-23 DIAGNOSIS — Z992 Dependence on renal dialysis: Secondary | ICD-10-CM | POA: Diagnosis not present

## 2019-10-23 DIAGNOSIS — T82868A Thrombosis of vascular prosthetic devices, implants and grafts, initial encounter: Secondary | ICD-10-CM | POA: Diagnosis not present

## 2019-10-23 DIAGNOSIS — D509 Iron deficiency anemia, unspecified: Secondary | ICD-10-CM | POA: Diagnosis not present

## 2019-10-23 DIAGNOSIS — N186 End stage renal disease: Secondary | ICD-10-CM | POA: Diagnosis not present

## 2019-10-23 DIAGNOSIS — N2581 Secondary hyperparathyroidism of renal origin: Secondary | ICD-10-CM | POA: Diagnosis not present

## 2019-10-24 ENCOUNTER — Other Ambulatory Visit: Payer: Self-pay | Admitting: Nurse Practitioner

## 2019-10-24 DIAGNOSIS — E1122 Type 2 diabetes mellitus with diabetic chronic kidney disease: Secondary | ICD-10-CM

## 2019-10-24 DIAGNOSIS — N186 End stage renal disease: Secondary | ICD-10-CM

## 2019-10-24 NOTE — Telephone Encounter (Signed)
[facility] is requesting a call back they have question about pt medcation

## 2019-10-24 NOTE — Telephone Encounter (Signed)
I spoke with the pharmacist and clarified the medication instruction for the Nizoral cream.

## 2019-10-25 DIAGNOSIS — D509 Iron deficiency anemia, unspecified: Secondary | ICD-10-CM | POA: Diagnosis not present

## 2019-10-25 DIAGNOSIS — N186 End stage renal disease: Secondary | ICD-10-CM | POA: Diagnosis not present

## 2019-10-25 DIAGNOSIS — N2581 Secondary hyperparathyroidism of renal origin: Secondary | ICD-10-CM | POA: Diagnosis not present

## 2019-10-25 DIAGNOSIS — Z992 Dependence on renal dialysis: Secondary | ICD-10-CM | POA: Diagnosis not present

## 2019-10-25 DIAGNOSIS — T82868A Thrombosis of vascular prosthetic devices, implants and grafts, initial encounter: Secondary | ICD-10-CM | POA: Diagnosis not present

## 2019-10-25 DIAGNOSIS — D631 Anemia in chronic kidney disease: Secondary | ICD-10-CM | POA: Diagnosis not present

## 2019-10-26 DIAGNOSIS — Z992 Dependence on renal dialysis: Secondary | ICD-10-CM | POA: Diagnosis not present

## 2019-10-26 DIAGNOSIS — N2581 Secondary hyperparathyroidism of renal origin: Secondary | ICD-10-CM | POA: Diagnosis not present

## 2019-10-26 DIAGNOSIS — D509 Iron deficiency anemia, unspecified: Secondary | ICD-10-CM | POA: Diagnosis not present

## 2019-10-26 DIAGNOSIS — T82868A Thrombosis of vascular prosthetic devices, implants and grafts, initial encounter: Secondary | ICD-10-CM | POA: Diagnosis not present

## 2019-10-26 DIAGNOSIS — D631 Anemia in chronic kidney disease: Secondary | ICD-10-CM | POA: Diagnosis not present

## 2019-10-26 DIAGNOSIS — N186 End stage renal disease: Secondary | ICD-10-CM | POA: Diagnosis not present

## 2019-10-27 DIAGNOSIS — L409 Psoriasis, unspecified: Secondary | ICD-10-CM | POA: Diagnosis not present

## 2019-10-27 DIAGNOSIS — T82868A Thrombosis of vascular prosthetic devices, implants and grafts, initial encounter: Secondary | ICD-10-CM | POA: Diagnosis not present

## 2019-10-27 DIAGNOSIS — N2581 Secondary hyperparathyroidism of renal origin: Secondary | ICD-10-CM | POA: Diagnosis not present

## 2019-10-27 DIAGNOSIS — M0579 Rheumatoid arthritis with rheumatoid factor of multiple sites without organ or systems involvement: Secondary | ICD-10-CM | POA: Diagnosis not present

## 2019-10-27 DIAGNOSIS — Z992 Dependence on renal dialysis: Secondary | ICD-10-CM | POA: Diagnosis not present

## 2019-10-27 DIAGNOSIS — D631 Anemia in chronic kidney disease: Secondary | ICD-10-CM | POA: Diagnosis not present

## 2019-10-27 DIAGNOSIS — M1711 Unilateral primary osteoarthritis, right knee: Secondary | ICD-10-CM | POA: Diagnosis not present

## 2019-10-27 DIAGNOSIS — M25561 Pain in right knee: Secondary | ICD-10-CM | POA: Insufficient documentation

## 2019-10-27 DIAGNOSIS — L4050 Arthropathic psoriasis, unspecified: Secondary | ICD-10-CM | POA: Diagnosis not present

## 2019-10-27 DIAGNOSIS — N186 End stage renal disease: Secondary | ICD-10-CM | POA: Diagnosis not present

## 2019-10-27 DIAGNOSIS — D509 Iron deficiency anemia, unspecified: Secondary | ICD-10-CM | POA: Diagnosis not present

## 2019-10-30 DIAGNOSIS — D509 Iron deficiency anemia, unspecified: Secondary | ICD-10-CM | POA: Diagnosis not present

## 2019-10-30 DIAGNOSIS — Z992 Dependence on renal dialysis: Secondary | ICD-10-CM | POA: Diagnosis not present

## 2019-10-30 DIAGNOSIS — N2581 Secondary hyperparathyroidism of renal origin: Secondary | ICD-10-CM | POA: Diagnosis not present

## 2019-10-30 DIAGNOSIS — T82868A Thrombosis of vascular prosthetic devices, implants and grafts, initial encounter: Secondary | ICD-10-CM | POA: Diagnosis not present

## 2019-10-30 DIAGNOSIS — N186 End stage renal disease: Secondary | ICD-10-CM | POA: Diagnosis not present

## 2019-10-30 DIAGNOSIS — D631 Anemia in chronic kidney disease: Secondary | ICD-10-CM | POA: Diagnosis not present

## 2019-11-01 DIAGNOSIS — D631 Anemia in chronic kidney disease: Secondary | ICD-10-CM | POA: Diagnosis not present

## 2019-11-01 DIAGNOSIS — N2581 Secondary hyperparathyroidism of renal origin: Secondary | ICD-10-CM | POA: Diagnosis not present

## 2019-11-01 DIAGNOSIS — N186 End stage renal disease: Secondary | ICD-10-CM | POA: Diagnosis not present

## 2019-11-01 DIAGNOSIS — T82868A Thrombosis of vascular prosthetic devices, implants and grafts, initial encounter: Secondary | ICD-10-CM | POA: Diagnosis not present

## 2019-11-01 DIAGNOSIS — Z992 Dependence on renal dialysis: Secondary | ICD-10-CM | POA: Diagnosis not present

## 2019-11-01 DIAGNOSIS — D509 Iron deficiency anemia, unspecified: Secondary | ICD-10-CM | POA: Diagnosis not present

## 2019-11-02 ENCOUNTER — Ambulatory Visit: Payer: Medicare Other | Admitting: Cardiovascular Disease

## 2019-11-03 DIAGNOSIS — N186 End stage renal disease: Secondary | ICD-10-CM | POA: Diagnosis not present

## 2019-11-03 DIAGNOSIS — Z992 Dependence on renal dialysis: Secondary | ICD-10-CM | POA: Diagnosis not present

## 2019-11-03 DIAGNOSIS — D509 Iron deficiency anemia, unspecified: Secondary | ICD-10-CM | POA: Diagnosis not present

## 2019-11-03 DIAGNOSIS — N2581 Secondary hyperparathyroidism of renal origin: Secondary | ICD-10-CM | POA: Diagnosis not present

## 2019-11-03 DIAGNOSIS — D631 Anemia in chronic kidney disease: Secondary | ICD-10-CM | POA: Diagnosis not present

## 2019-11-03 DIAGNOSIS — T82868A Thrombosis of vascular prosthetic devices, implants and grafts, initial encounter: Secondary | ICD-10-CM | POA: Diagnosis not present

## 2019-11-06 DIAGNOSIS — D631 Anemia in chronic kidney disease: Secondary | ICD-10-CM | POA: Diagnosis not present

## 2019-11-06 DIAGNOSIS — D509 Iron deficiency anemia, unspecified: Secondary | ICD-10-CM | POA: Diagnosis not present

## 2019-11-06 DIAGNOSIS — T82868A Thrombosis of vascular prosthetic devices, implants and grafts, initial encounter: Secondary | ICD-10-CM | POA: Diagnosis not present

## 2019-11-06 DIAGNOSIS — Z992 Dependence on renal dialysis: Secondary | ICD-10-CM | POA: Diagnosis not present

## 2019-11-06 DIAGNOSIS — N2581 Secondary hyperparathyroidism of renal origin: Secondary | ICD-10-CM | POA: Diagnosis not present

## 2019-11-06 DIAGNOSIS — N186 End stage renal disease: Secondary | ICD-10-CM | POA: Diagnosis not present

## 2019-11-08 DIAGNOSIS — D631 Anemia in chronic kidney disease: Secondary | ICD-10-CM | POA: Diagnosis not present

## 2019-11-08 DIAGNOSIS — N186 End stage renal disease: Secondary | ICD-10-CM | POA: Diagnosis not present

## 2019-11-08 DIAGNOSIS — Z992 Dependence on renal dialysis: Secondary | ICD-10-CM | POA: Diagnosis not present

## 2019-11-08 DIAGNOSIS — N2581 Secondary hyperparathyroidism of renal origin: Secondary | ICD-10-CM | POA: Diagnosis not present

## 2019-11-08 DIAGNOSIS — D509 Iron deficiency anemia, unspecified: Secondary | ICD-10-CM | POA: Diagnosis not present

## 2019-11-08 DIAGNOSIS — T82868A Thrombosis of vascular prosthetic devices, implants and grafts, initial encounter: Secondary | ICD-10-CM | POA: Diagnosis not present

## 2019-11-10 DIAGNOSIS — D509 Iron deficiency anemia, unspecified: Secondary | ICD-10-CM | POA: Diagnosis not present

## 2019-11-10 DIAGNOSIS — N2581 Secondary hyperparathyroidism of renal origin: Secondary | ICD-10-CM | POA: Diagnosis not present

## 2019-11-10 DIAGNOSIS — Z992 Dependence on renal dialysis: Secondary | ICD-10-CM | POA: Diagnosis not present

## 2019-11-10 DIAGNOSIS — T82868A Thrombosis of vascular prosthetic devices, implants and grafts, initial encounter: Secondary | ICD-10-CM | POA: Diagnosis not present

## 2019-11-10 DIAGNOSIS — N186 End stage renal disease: Secondary | ICD-10-CM | POA: Diagnosis not present

## 2019-11-10 DIAGNOSIS — D631 Anemia in chronic kidney disease: Secondary | ICD-10-CM | POA: Diagnosis not present

## 2019-11-13 DIAGNOSIS — B351 Tinea unguium: Secondary | ICD-10-CM | POA: Diagnosis not present

## 2019-11-13 DIAGNOSIS — T82868A Thrombosis of vascular prosthetic devices, implants and grafts, initial encounter: Secondary | ICD-10-CM | POA: Diagnosis not present

## 2019-11-13 DIAGNOSIS — L851 Acquired keratosis [keratoderma] palmaris et plantaris: Secondary | ICD-10-CM | POA: Diagnosis not present

## 2019-11-13 DIAGNOSIS — E1140 Type 2 diabetes mellitus with diabetic neuropathy, unspecified: Secondary | ICD-10-CM | POA: Diagnosis not present

## 2019-11-13 DIAGNOSIS — D631 Anemia in chronic kidney disease: Secondary | ICD-10-CM | POA: Diagnosis not present

## 2019-11-13 DIAGNOSIS — Z794 Long term (current) use of insulin: Secondary | ICD-10-CM | POA: Diagnosis not present

## 2019-11-13 DIAGNOSIS — N186 End stage renal disease: Secondary | ICD-10-CM | POA: Diagnosis not present

## 2019-11-13 DIAGNOSIS — N2581 Secondary hyperparathyroidism of renal origin: Secondary | ICD-10-CM | POA: Diagnosis not present

## 2019-11-13 DIAGNOSIS — D509 Iron deficiency anemia, unspecified: Secondary | ICD-10-CM | POA: Diagnosis not present

## 2019-11-13 DIAGNOSIS — Z992 Dependence on renal dialysis: Secondary | ICD-10-CM | POA: Diagnosis not present

## 2019-11-15 DIAGNOSIS — D509 Iron deficiency anemia, unspecified: Secondary | ICD-10-CM | POA: Diagnosis not present

## 2019-11-15 DIAGNOSIS — D631 Anemia in chronic kidney disease: Secondary | ICD-10-CM | POA: Diagnosis not present

## 2019-11-15 DIAGNOSIS — T82868A Thrombosis of vascular prosthetic devices, implants and grafts, initial encounter: Secondary | ICD-10-CM | POA: Diagnosis not present

## 2019-11-15 DIAGNOSIS — N186 End stage renal disease: Secondary | ICD-10-CM | POA: Diagnosis not present

## 2019-11-15 DIAGNOSIS — Z992 Dependence on renal dialysis: Secondary | ICD-10-CM | POA: Diagnosis not present

## 2019-11-15 DIAGNOSIS — N2581 Secondary hyperparathyroidism of renal origin: Secondary | ICD-10-CM | POA: Diagnosis not present

## 2019-11-17 DIAGNOSIS — Z992 Dependence on renal dialysis: Secondary | ICD-10-CM | POA: Diagnosis not present

## 2019-11-17 DIAGNOSIS — T82868A Thrombosis of vascular prosthetic devices, implants and grafts, initial encounter: Secondary | ICD-10-CM | POA: Diagnosis not present

## 2019-11-17 DIAGNOSIS — N2581 Secondary hyperparathyroidism of renal origin: Secondary | ICD-10-CM | POA: Diagnosis not present

## 2019-11-17 DIAGNOSIS — N186 End stage renal disease: Secondary | ICD-10-CM | POA: Diagnosis not present

## 2019-11-17 DIAGNOSIS — D509 Iron deficiency anemia, unspecified: Secondary | ICD-10-CM | POA: Diagnosis not present

## 2019-11-17 DIAGNOSIS — D631 Anemia in chronic kidney disease: Secondary | ICD-10-CM | POA: Diagnosis not present

## 2019-11-20 DIAGNOSIS — N186 End stage renal disease: Secondary | ICD-10-CM | POA: Diagnosis not present

## 2019-11-20 DIAGNOSIS — D509 Iron deficiency anemia, unspecified: Secondary | ICD-10-CM | POA: Diagnosis not present

## 2019-11-20 DIAGNOSIS — T82868A Thrombosis of vascular prosthetic devices, implants and grafts, initial encounter: Secondary | ICD-10-CM | POA: Diagnosis not present

## 2019-11-20 DIAGNOSIS — D631 Anemia in chronic kidney disease: Secondary | ICD-10-CM | POA: Diagnosis not present

## 2019-11-20 DIAGNOSIS — N2581 Secondary hyperparathyroidism of renal origin: Secondary | ICD-10-CM | POA: Diagnosis not present

## 2019-11-20 DIAGNOSIS — Z992 Dependence on renal dialysis: Secondary | ICD-10-CM | POA: Diagnosis not present

## 2019-11-21 DIAGNOSIS — H4052X0 Glaucoma secondary to other eye disorders, left eye, stage unspecified: Secondary | ICD-10-CM | POA: Diagnosis not present

## 2019-11-24 DIAGNOSIS — H4052X0 Glaucoma secondary to other eye disorders, left eye, stage unspecified: Secondary | ICD-10-CM | POA: Diagnosis not present

## 2019-12-11 ENCOUNTER — Telehealth: Payer: Self-pay

## 2019-12-11 NOTE — Telephone Encounter (Signed)
Left message to call us back to answer questions regarding virtual appointment.

## 2019-12-12 ENCOUNTER — Ambulatory Visit
Payer: Medicare Other | Attending: Student in an Organized Health Care Education/Training Program | Admitting: Student in an Organized Health Care Education/Training Program

## 2019-12-12 ENCOUNTER — Other Ambulatory Visit: Payer: Self-pay

## 2019-12-12 ENCOUNTER — Encounter: Payer: Self-pay | Admitting: Student in an Organized Health Care Education/Training Program

## 2019-12-12 DIAGNOSIS — M1711 Unilateral primary osteoarthritis, right knee: Secondary | ICD-10-CM | POA: Insufficient documentation

## 2019-12-12 DIAGNOSIS — M5030 Other cervical disc degeneration, unspecified cervical region: Secondary | ICD-10-CM

## 2019-12-12 DIAGNOSIS — M47812 Spondylosis without myelopathy or radiculopathy, cervical region: Secondary | ICD-10-CM

## 2019-12-12 DIAGNOSIS — M5412 Radiculopathy, cervical region: Secondary | ICD-10-CM

## 2019-12-12 DIAGNOSIS — E1142 Type 2 diabetes mellitus with diabetic polyneuropathy: Secondary | ICD-10-CM

## 2019-12-12 DIAGNOSIS — G894 Chronic pain syndrome: Secondary | ICD-10-CM

## 2019-12-12 DIAGNOSIS — M25461 Effusion, right knee: Secondary | ICD-10-CM

## 2019-12-12 MED ORDER — OXYCODONE HCL 5 MG PO TABS: 5.0000 mg | ORAL_TABLET | Freq: Two times a day (BID) | ORAL | 0 refills | Status: DC | PRN

## 2019-12-12 NOTE — Assessment & Plan Note (Signed)
Orders Placed This Encounter  Procedures  . Steroid Knee Blk (PRN)    For knee pain.    Standing Status:   Standing    Number of Occurrences:   2    Standing Expiration Date:   12/11/2020    Scheduling Instructions:     Side: RIGHT     Sedation: None     TIMEFRAME: PRN procedure. (Ms. Amster will call when needed.)    Order Specific Question:   Where will this procedure be performed?    Answer:   [facility]

## 2019-12-12 NOTE — Progress Notes (Signed)
Virtual Encounter - Pain Management PROVIDER NOTE: Information contained herein reflects review and annotations entered in association with encounter. Patient information is provided elsewhere in the medical record. Interpretation of information and data should be left to medically trained personnel. Document created using STT technology, any transcriptional errors that may result from process are unintentional.  Contact & Pharmacy Preferred: [phone] Home: [phone] (home) Mobile: [phone] (mobile) E-mail: [email or web address]  [place], [place] [address] [place] [address] Phone: [phone] Fax: [phone]   Pre-screening  [name] offered "in-person" vs "virtual" encounter. She indicated preferring virtual for this encounter.   Reason COVID-19*  Social distancing based on CDC and AMA recommendations.   I contacted [name] on 12/12/2019 via video conference.      I clearly identified myself as Gillis Santa, MD. I verified that I was speaking with the correct person using two identifiers (Name: [name], and date of birth: 28-Apr-1966).  Consent I sought verbal advanced consent from [name] for virtual visit interactions. I informed [name] of possible security and privacy concerns, risks, and limitations associated with providing "not-in-person" medical evaluation and management services. I also informed [name] of the availability of "in-person" appointments. Finally, I informed her that there would be a charge for the virtual visit and that she could be  personally, fully or partially, financially responsible for it. [name] expressed understanding and agreed to proceed.   Historic Elements   [name] is a 53 y.o. year old, female patient evaluated today after her last encounter by our practice on 12/11/2019. [name]  has a past medical history of Allergy, Anemia, Anxiety, Arthritis, Ataxia, COPD  (chronic obstructive pulmonary disease) ([place]), Depression, Diabetes mellitus with complication ([place]), ESRD on hemodialysis ([place]), Fistula, Hepatitis (2003), Hiatal hernia, Hypercholesterolemia, Hypertension, Migraine, Neuropathy, diabetic ([place]), Peripheral vascular disease ([place]), Pneumonia (2015), Psoriasis, Renal insufficiency, Tobacco dependence, and Wears dentures. She also  has a past surgical history that includes Tonsillectomy; rt. tubal and ovary removed; Cholecystectomy; Cardiac catheterization (N/A, 07/25/2015); Cardiac catheterization (Left, 07/25/2015); Cardiac catheterization (Left, 10/07/2015); Cardiac catheterization (N/A, 10/07/2015); Cardiac catheterization (10/07/2015); Cardiac catheterization (N/A, 12/17/2015); Incision and drainage abscess (N/A, 07/16/2016); cyst removed  from left hand (Left, 1989); AV fistula placement (Left, 11/2014); Cardiac catheterization (N/A, 01/11/2017); Cardiac catheterization (N/A, 01/11/2017); DIALYSIS/PERMA CATHETER INSERTION (N/A, 05/20/2017); Cesarean section; Tubal ligation; Colonoscopy (N/A, 09/22/2017); Esophagogastroduodenoscopy (N/A, 09/22/2017); polypectomy (09/22/2017); A/V SHUNT INTERVENTION (N/A, 12/16/2017); STENT PLACEMENT VASCULAR ([facility]) (Left, 03/2018); and Upper Extremity Angiography (Left, 09/29/2019). [name] has a current medication list which includes the following prescription(s): humira, amitriptyline, atorvastatin, calcium acetate, diltiazem, glucose blood, insulin aspart, ketoconazole, lantus, levocetirizine, omeprazole, [START ON 12/23/2019] oxycodone, [START ON 01/22/2020] oxycodone, [START ON 02/21/2020] oxycodone, torsemide, trueplus insulin syringe, and ventolin hfa. She  reports that she has been smoking cigarettes. She has a 5.00 pack-year smoking history. She uses smokeless tobacco. She reports that she does not drink alcohol or use drugs. [name] is allergic to cinnamon; garlic; onion; tylenol [acetaminophen]; ciprofloxacin; and prednisone.    HPI  Today, she is being contacted for medication management.   Patient saw orthopedics on 12/07/2019 for her right knee pain and left fourth digit trigger finger.  She also had an effusion of her right knee.  Dr. Thomasene Lot performed a right knee ultrasound-guided steroid injection and also drained her knee effusion.  She also had a injection to her left fourth digit  trigger finger.  I informed her that in the future, given that I am her pain physician, I am happy to perform these for her however she needs to call our clinic and inform us as to what is going on.  Patient endorsed understanding.  Otherwise she continues her medications as prescribed.  They do help her manage her pain.  She is able to function on these medications as well.  No side effects.  Refill medications as below.  Instructed patient to come in sometime in January for serum toxicology screen for medication compliance and monitoring.  Patient endorsed understanding.  Pharmacotherapy Assessment  Analgesic:  11/23/2019  2   09/12/2019  Oxycodone Hcl 5 MG Tablet  60.00  30 Bi Lat   [id number]   [place] ([id number])   0  15.00 MME  Medicare   [place]    Monitoring: Pharmacotherapy: No side-effects or adverse reactions reported. [place] PMP: PDMP reviewed during this encounter.       Compliance: No problems identified. Effectiveness: Clinically acceptable. Plan: Refer to "POC".  UDS: No results found for: SUMMARY Laboratory Chemistry Profile (12 mo)  Renal: 05/01/2019: BUN 52; Creatinine, Ser 7.50  Lab Results  Component Value Date   GFRAA 6 (L) 05/01/2019   GFRNONAA 6 (L) 05/01/2019   Hepatic: 07/11/2019: Albumin 4.3 Lab Results  Component Value Date   AST 28 07/11/2019   ALT 24 07/11/2019   Other: No results found for requested labs within last 8760 hours. Note: Above Lab results reviewed.  Imaging  PERIPHERAL VASCULAR CATHETERIZATION See op note   Assessment  The primary encounter diagnosis was Cervical radiculopathy.  Diagnoses of Diabetic polyneuropathy associated with type 2 diabetes mellitus ([place]), DDD (degenerative disc disease), cervical, Spondylosis of cervical region without myelopathy or radiculopathy, Primary osteoarthritis of right knee, Effusion of right knee joint, and Chronic pain syndrome were also pertinent to this visit.  Plan of Care  Problem-specific:  Primary osteoarthritis of right knee Orders Placed This Encounter  Procedures  . Steroid Knee Blk (PRN)    For knee pain.    Standing Status:   Standing    Number of Occurrences:   2    Standing Expiration Date:   12/11/2020    Scheduling Instructions:     Side: RIGHT     Sedation: None     TIMEFRAME: PRN procedure. ([name] will call when needed.)    Order Specific Question:   Where will this procedure be performed?    Answer:   [facility]   I am having [name] start on oxyCODONE, oxyCODONE, and oxyCODONE. I am also having her maintain her calcium acetate, glucose blood, torsemide, diltiazem, omeprazole, Ventolin HFA, atorvastatin, amitriptyline, ketoconazole, Humira, insulin aspart, Lantus, levocetirizine, and TRUEplus Insulin Syringe.  Pharmacotherapy (Medications Ordered): Meds ordered this encounter  Medications  . oxyCODONE (OXY IR/ROXICODONE) 5 MG immediate release tablet    Sig: Take 1 tablet (5 mg total) by mouth 2 (two) times daily as needed for severe pain. Must last 30 days.    Dispense:  60 tablet    Refill:  0    Chronic Pain. (STOP Act - Not applicable). Fill one day early if closed on scheduled refill date.  Marland Kitchen oxyCODONE (OXY IR/ROXICODONE) 5 MG immediate release tablet    Sig: Take 1 tablet (5 mg total) by mouth 2 (two) times daily as needed for severe pain. Must last 30 days.    Dispense:  60 tablet    Refill:  0    Chronic Pain. (STOP Act - Not applicable). Fill one day early if closed on scheduled refill date.  Marland Kitchen oxyCODONE (OXY IR/ROXICODONE) 5 MG immediate release tablet    Sig: Take 1  tablet (5 mg total) by mouth 2 (two) times daily as needed for severe pain. Must last 30 days.    Dispense:  60 tablet    Refill:  0    Chronic Pain. (STOP Act - Not applicable). Fill one day early if closed on scheduled refill date.   Orders:  Orders Placed This Encounter  Procedures  . Steroid Knee Blk (PRN)    For knee pain.    Standing Status:   Standing    Number of Occurrences:   2    Standing Expiration Date:   12/11/2020    Scheduling Instructions:     Side: RIGHT     Sedation: None     TIMEFRAME: PRN procedure. ([name] will call when needed.)    Order Specific Question:   Where will this procedure be performed?    Answer:   [facility]   Follow-up plan:   Return in about 3 months (around 03/11/2020) for Medication Management, virtual.    Recent Visits No visits were found meeting these conditions.  Showing recent visits within past 90 days and meeting all other requirements   Today's Visits Date Type Provider Dept  12/12/19 Office Visit Gillis Santa, MD [facility]  Showing today's visits and meeting all other requirements   Future Appointments No visits were found meeting these conditions.  Showing future appointments within next 90 days and meeting all other requirements   I discussed the assessment and treatment plan with the patient. The patient was provided an opportunity to ask questions and all were answered. The patient agreed with the plan and demonstrated an understanding of the instructions.  Patient advised to call back or seek an in-person evaluation if the symptoms or condition worsens.  Total duration of non-face-to-face encounter: 41minutes.  Note by: Gillis Santa, MD Date: 12/12/2019; Time: 8:58 AM

## 2019-12-18 ENCOUNTER — Other Ambulatory Visit
Admission: RE | Admit: 2019-12-18 | Discharge: 2019-12-18 | Disposition: A | Discharge status: Home / Self Care | Payer: Medicare Other | Source: Ambulatory Visit | Attending: Ophthalmology | Admitting: Ophthalmology

## 2019-12-18 ENCOUNTER — Other Ambulatory Visit: Payer: Self-pay

## 2019-12-18 ENCOUNTER — Encounter: Payer: Self-pay | Admitting: Ophthalmology

## 2019-12-18 DIAGNOSIS — Z01812 Encounter for preprocedural laboratory examination: Secondary | ICD-10-CM | POA: Diagnosis present

## 2019-12-18 DIAGNOSIS — Z20828 Contact with and (suspected) exposure to other viral communicable diseases: Secondary | ICD-10-CM | POA: Insufficient documentation

## 2019-12-18 NOTE — Anesthesia Preprocedure Evaluation (Deleted)
Anesthesia Evaluation    Airway        Dental   Pulmonary Current Smoker,           Cardiovascular hypertension,      Neuro/Psych    GI/Hepatic   Endo/Other  diabetes  Renal/GU      Musculoskeletal   Abdominal   Peds  Hematology   Anesthesia Other Findings   Reproductive/Obstetrics                             Anesthesia Physical Anesthesia Plan Anesthesia Quick Evaluation

## 2019-12-19 LAB — SARS CORONAVIRUS 2 (TAT 6-24 HRS): SARS Coronavirus 2: NEGATIVE

## 2019-12-19 NOTE — Discharge Instructions (Signed)

## 2019-12-20 ENCOUNTER — Ambulatory Visit: Payer: Medicare Other | Admitting: Anesthesiology

## 2019-12-20 ENCOUNTER — Encounter: Payer: Self-pay | Admitting: Ophthalmology

## 2019-12-20 ENCOUNTER — Ambulatory Visit
Admission: RE | Admit: 2019-12-20 | Discharge: 2019-12-20 | Disposition: A | Discharge status: Home / Self Care | Payer: Medicare Other | Attending: Ophthalmology | Admitting: Ophthalmology

## 2019-12-20 ENCOUNTER — Other Ambulatory Visit: Payer: Self-pay

## 2019-12-20 ENCOUNTER — Encounter
Admission: RE | Disposition: A | Discharge status: Home / Self Care | Payer: Self-pay | Source: Home / Self Care | Attending: Ophthalmology

## 2019-12-20 DIAGNOSIS — Z992 Dependence on renal dialysis: Secondary | ICD-10-CM | POA: Insufficient documentation

## 2019-12-20 DIAGNOSIS — F17200 Nicotine dependence, unspecified, uncomplicated: Secondary | ICD-10-CM | POA: Insufficient documentation

## 2019-12-20 DIAGNOSIS — Z794 Long term (current) use of insulin: Secondary | ICD-10-CM | POA: Insufficient documentation

## 2019-12-20 DIAGNOSIS — R161 Splenomegaly, not elsewhere classified: Secondary | ICD-10-CM | POA: Diagnosis not present

## 2019-12-20 DIAGNOSIS — K219 Gastro-esophageal reflux disease without esophagitis: Secondary | ICD-10-CM | POA: Insufficient documentation

## 2019-12-20 DIAGNOSIS — N186 End stage renal disease: Secondary | ICD-10-CM | POA: Insufficient documentation

## 2019-12-20 DIAGNOSIS — K589 Irritable bowel syndrome without diarrhea: Secondary | ICD-10-CM | POA: Insufficient documentation

## 2019-12-20 DIAGNOSIS — H4089 Other specified glaucoma: Secondary | ICD-10-CM | POA: Insufficient documentation

## 2019-12-20 DIAGNOSIS — Z9049 Acquired absence of other specified parts of digestive tract: Secondary | ICD-10-CM | POA: Insufficient documentation

## 2019-12-20 DIAGNOSIS — E1122 Type 2 diabetes mellitus with diabetic chronic kidney disease: Secondary | ICD-10-CM | POA: Diagnosis not present

## 2019-12-20 DIAGNOSIS — Z881 Allergy status to other antibiotic agents status: Secondary | ICD-10-CM | POA: Diagnosis not present

## 2019-12-20 DIAGNOSIS — J449 Chronic obstructive pulmonary disease, unspecified: Secondary | ICD-10-CM | POA: Diagnosis not present

## 2019-12-20 DIAGNOSIS — R519 Headache, unspecified: Secondary | ICD-10-CM | POA: Diagnosis not present

## 2019-12-20 DIAGNOSIS — M1990 Unspecified osteoarthritis, unspecified site: Secondary | ICD-10-CM | POA: Insufficient documentation

## 2019-12-20 DIAGNOSIS — R011 Cardiac murmur, unspecified: Secondary | ICD-10-CM | POA: Insufficient documentation

## 2019-12-20 DIAGNOSIS — I120 Hypertensive chronic kidney disease with stage 5 chronic kidney disease or end stage renal disease: Secondary | ICD-10-CM | POA: Diagnosis not present

## 2019-12-20 DIAGNOSIS — H2100 Hyphema, unspecified eye: Secondary | ICD-10-CM | POA: Diagnosis present

## 2019-12-20 DIAGNOSIS — Z886 Allergy status to analgesic agent status: Secondary | ICD-10-CM | POA: Insufficient documentation

## 2019-12-20 DIAGNOSIS — Z8614 Personal history of Methicillin resistant Staphylococcus aureus infection: Secondary | ICD-10-CM | POA: Insufficient documentation

## 2019-12-20 DIAGNOSIS — G709 Myoneural disorder, unspecified: Secondary | ICD-10-CM | POA: Diagnosis not present

## 2019-12-20 DIAGNOSIS — K7469 Other cirrhosis of liver: Secondary | ICD-10-CM | POA: Insufficient documentation

## 2019-12-20 DIAGNOSIS — Z888 Allergy status to other drugs, medicaments and biological substances status: Secondary | ICD-10-CM | POA: Insufficient documentation

## 2019-12-20 DIAGNOSIS — E1139 Type 2 diabetes mellitus with other diabetic ophthalmic complication: Secondary | ICD-10-CM | POA: Diagnosis not present

## 2019-12-20 DIAGNOSIS — H42 Glaucoma in diseases classified elsewhere: Secondary | ICD-10-CM | POA: Diagnosis not present

## 2019-12-20 DIAGNOSIS — E119 Type 2 diabetes mellitus without complications: Secondary | ICD-10-CM | POA: Insufficient documentation

## 2019-12-20 HISTORY — DX: Unspecified cirrhosis of liver: K74.60

## 2019-12-20 HISTORY — PX: AQUEOUS SHUNT: SHX6305

## 2019-12-20 HISTORY — DX: Dependence on renal dialysis: Z99.2

## 2019-12-20 LAB — GLUCOSE, CAPILLARY
Glucose-Capillary: 363 mg/dL — ABNORMAL HIGH (ref 70–99)
Glucose-Capillary: 375 mg/dL — ABNORMAL HIGH (ref 70–99)
Glucose-Capillary: 335 mg/dL — ABNORMAL HIGH (ref 70–99)
Glucose-Capillary: 335 mg/dL — ABNORMAL HIGH (ref 70–99)

## 2019-12-20 SURGERY — INSERTION, AQUEOUS SHUNT, EYE
Anesthesia: Monitor Anesthesia Care | Site: Eye | Laterality: Left

## 2019-12-20 MED ORDER — NEOMYCIN-POLYMYXIN-DEXAMETH 3.5-10000-0.1 OP OINT
TOPICAL_OINTMENT | OPHTHALMIC | Status: DC | PRN
  Administered 2019-12-20: 1 via OPHTHALMIC

## 2019-12-20 MED ORDER — ACETAMINOPHEN 160 MG/5ML PO SOLN: 325.0000 mg | Freq: Once | ORAL | Status: DC

## 2019-12-20 MED ORDER — ATROPINE SULFATE 1 % OP SOLN
OPHTHALMIC | Status: DC | PRN
  Administered 2019-12-20: 1 [drp] via OPHTHALMIC

## 2019-12-20 MED ORDER — OXYCODONE HCL 5 MG PO TABS
5.0000 mg | ORAL_TABLET | Freq: Once | ORAL | Status: AC
  Administered 2019-12-20: 5 mg via ORAL

## 2019-12-20 MED ORDER — ALFENTANIL 500 MCG/ML IJ INJ
INJECTION | INTRAVENOUS | Status: DC | PRN
  Administered 2019-12-20: 200 ug via INTRAVENOUS
  Administered 2019-12-20: 300 ug via INTRAVENOUS
  Administered 2019-12-20: 500 ug via INTRAVENOUS

## 2019-12-20 MED ORDER — ACETAMINOPHEN 325 MG PO TABS: 325.0000 mg | ORAL_TABLET | Freq: Once | ORAL | Status: DC

## 2019-12-20 MED ORDER — LIDOCAINE HCL 2 % IJ SOLN
INTRAMUSCULAR | Status: DC | PRN
  Administered 2019-12-20: 4 mL via RETROBULBAR

## 2019-12-20 MED ORDER — INSULIN LISPRO 100 UNIT/ML ~~LOC~~ SOLN
8.0000 [IU] | Freq: Once | SUBCUTANEOUS | Status: AC
  Administered 2019-12-20: 8 [IU] via SUBCUTANEOUS

## 2019-12-20 MED ORDER — CEFUROXIME OPHTHALMIC INJECTION 1 MG/0.1 ML
INJECTION | OPHTHALMIC | Status: DC | PRN
  Administered 2019-12-20: 0.1 mL via INTRACAMERAL

## 2019-12-20 MED ORDER — HYALURONIDASE HUMAN 150 UNIT/ML IJ SOLN
INTRAMUSCULAR | Status: DC | PRN
  Administered 2019-12-20: 150 [IU]

## 2019-12-20 MED ORDER — LIDOCAINE HCL (PF) 2 % IJ SOLN
INTRAOCULAR | Status: DC | PRN
  Administered 2019-12-20: 2 mL

## 2019-12-20 MED ORDER — MOXIFLOXACIN HCL 0.5 % OP SOLN
1.0000 [drp] | OPHTHALMIC | Status: DC | PRN
  Administered 2019-12-20 (×3): 1 [drp] via OPHTHALMIC

## 2019-12-20 MED ORDER — LACTATED RINGERS IV SOLN: 10.0000 mL/h | INTRAVENOUS | Status: DC

## 2019-12-20 MED ORDER — SODIUM HYALURONATE 10 MG/ML IO SOLN
INTRAOCULAR | Status: DC | PRN
  Administered 2019-12-20: 0.55 mL via INTRAOCULAR

## 2019-12-20 MED ORDER — MIDAZOLAM HCL 2 MG/2ML IJ SOLN
INTRAMUSCULAR | Status: DC | PRN
  Administered 2019-12-20: 1 mg via INTRAVENOUS
  Administered 2019-12-20: 2 mg via INTRAVENOUS
  Administered 2019-12-20: 1 mg via INTRAVENOUS

## 2019-12-20 MED ORDER — FENTANYL CITRATE (PF) 100 MCG/2ML IJ SOLN
INTRAMUSCULAR | Status: DC | PRN
  Administered 2019-12-20 (×2): 25 ug via INTRAVENOUS
  Administered 2019-12-20: 50 ug via INTRAVENOUS

## 2019-12-20 MED ORDER — MOXIFLOXACIN HCL 0.5 % OP SOLN
OPHTHALMIC | Status: DC | PRN
  Administered 2019-12-20: 0.2 mL

## 2019-12-20 SURGICAL SUPPLY — 30 items
ALLOGRAFT TUTOPLST SCER0.5X1.0 (Tissue) IMPLANT
BLADE MINI RND TIP GREEN BEAV (BLADE) ×2 IMPLANT
CANNULA ANT/CHMB 27G (MISCELLANEOUS) IMPLANT
CANNULA ANT/CHMB 27GA (MISCELLANEOUS) ×3 IMPLANT
CORD BIP STRL DISP 12FT (MISCELLANEOUS) ×3 IMPLANT
CUP MEDICINE 2OZ PLAST GRAD ST (MISCELLANEOUS) ×3 IMPLANT
GLOVE BIO SURGEON STRL SZ7.5 (GLOVE) ×3 IMPLANT
GLOVE SURG TRIUMPH 8.0 PF LTX (GLOVE) ×3 IMPLANT
GOWN STRL REUS W/ TWL LRG LVL3 (GOWN DISPOSABLE) ×2 IMPLANT
GOWN STRL REUS W/TWL LRG LVL3 (GOWN DISPOSABLE) ×4
KNIFE SIDECUT EYE (MISCELLANEOUS) ×3 IMPLANT
MARKER SKIN DUAL TIP RULER LAB (MISCELLANEOUS) ×3 IMPLANT
NDL FILTER BLUNT 18X1 1/2 (NEEDLE) ×2 IMPLANT
NDL RETROBULBAR 25GX1.5 STRL (NEEDLE) ×2 IMPLANT
NEEDLE FILTER BLUNT 18X 1/2SAF (NEEDLE) ×4
NEEDLE FILTER BLUNT 18X1 1/2 (NEEDLE) ×2 IMPLANT
NEEDLE HYPO 26X3/8 (NEEDLE) ×2 IMPLANT
PACK EYE AFTER SURG (MISCELLANEOUS) ×3 IMPLANT
SOL BAL SALT 15ML (MISCELLANEOUS) ×9
SOLUTION BAL SALT 15ML (MISCELLANEOUS) ×2 IMPLANT
SPONGE SURG I SPEAR (MISCELLANEOUS) ×9 IMPLANT
SUT ETHILON 8 0 TG100 8 (SUTURE) ×2 IMPLANT
SUT VICRYL 8 0 BV 130 5 (SUTURE) ×2 IMPLANT
SYR 10ML LL (SYRINGE) ×3 IMPLANT
SYR 3ML LL SCALE MARK (SYRINGE) ×4 IMPLANT
SYR 5ML LL (SYRINGE) ×3 IMPLANT
TUTOPLAST SCIERA 0.5X1.0 (Tissue) ×3 IMPLANT
VALVE GLAUCOMA AHMED (Prosthesis & Implant Heart) ×2 IMPLANT
WATER STERILE IRR 500ML POUR (IV SOLUTION) ×3 IMPLANT
WIPE NON LINTING 3.25X3.25 (MISCELLANEOUS) ×3 IMPLANT

## 2019-12-20 NOTE — Transfer of Care (Signed)
Immediate Anesthesia Transfer of Care Note  Patient: [name]  Procedure(s) Performed: AHMED TUBE SHUNT WITH TUTOPLAST AND AC WASHOUT LEFT DIABETIC (Left Eye)  Patient Location: PACU  Anesthesia Type: MAC  Level of Consciousness: awake, alert  and patient cooperative  Airway and Oxygen Therapy: Patient Spontanous Breathing and Patient connected to supplemental oxygen  Post-op Assessment: Post-op Vital signs reviewed, Patient's Cardiovascular Status Stable, Respiratory Function Stable, Patent Airway and No signs of Nausea or vomiting  Post-op Vital Signs: Reviewed and stable  Complications: No apparent anesthesia complications

## 2019-12-20 NOTE — Anesthesia Preprocedure Evaluation (Signed)
Anesthesia Evaluation  Patient identified by MRN, date of birth, ID band Patient awake    Reviewed: Allergy & Precautions, H&P , NPO status , Patient's Chart, lab work & pertinent test results  Airway Mallampati: II  TM Distance: >3 FB Neck ROM: full    Dental  (+) Upper Dentures, Partial Lower   Pulmonary COPD, Current Smoker and Patient abstained from smoking.,    Pulmonary exam normal breath sounds clear to auscultation       Cardiovascular hypertension, Normal cardiovascular exam Rhythm:regular Rate:Normal     Neuro/Psych  Headaches,  Neuromuscular disease    GI/Hepatic (+) Hepatitis -, C  Endo/Other  diabetes, Poorly Controlled, Type 2, Insulin Dependent  Renal/GU      Musculoskeletal   Abdominal   Peds  Hematology   Anesthesia Other Findings Elevated BS=375 today.  Discussed with surgeon.  Will treat and proceed.  Reproductive/Obstetrics                             Anesthesia Physical Anesthesia Plan  ASA: III  Anesthesia Plan: MAC   Post-op Pain Management:    Induction:   PONV Risk Score and Plan: 2 and TIVA, Midazolam and Treatment may vary due to age or medical condition  Airway Management Planned:   Additional Equipment:   Intra-op Plan:   Post-operative Plan:   Informed Consent: I have reviewed the patients History and Physical, chart, labs and discussed the procedure including the risks, benefits and alternatives for the proposed anesthesia with the patient or authorized representative who has indicated his/her understanding and acceptance.       Plan Discussed with: CRNA  Anesthesia Plan Comments:         Anesthesia Quick Evaluation

## 2019-12-20 NOTE — Anesthesia Postprocedure Evaluation (Signed)
Anesthesia Post Note  Patient: [name]  Procedure(s) Performed: AHMED TUBE SHUNT WITH TUTOPLAST AND AC WASHOUT LEFT DIABETIC (Left Eye)     Patient location during evaluation: PACU Anesthesia Type: MAC Level of consciousness: awake and alert and oriented Pain management: satisfactory to patient Vital Signs Assessment: post-procedure vital signs reviewed and stable Respiratory status: spontaneous breathing, nonlabored ventilation and respiratory function stable Cardiovascular status: blood pressure returned to baseline and stable Postop Assessment: Adequate PO intake and No signs of nausea or vomiting Anesthetic complications: no    Raliegh Ip

## 2019-12-20 NOTE — H&P (Signed)
The History and Physical notes are on paper, have been signed, and are to be scanned. The patient remains stable and unchanged from the H&P.   Previous H&P reviewed, patient examined, and there are no changes.  [name] 12/20/2019 8:51 AM

## 2019-12-20 NOTE — Op Note (Signed)
OPERATIVE NOTE  [name] [id number] 12/20/2019  PREOPERATIVE DIAGNOSIS: 1. Uncontrolled neovascular  glaucoma left eye stage unspecified .  H40.52x0  2. Hyphema  POSTOPERATIVE DIAGNOSIS: 1. Uncontrolled neovascular  glaucoma left eye stage unspecified .  H40.52x0  2. Hyphema   GLAUCOMA IMPLANT:   Implant Name Type Inv. Item Serial No. Manufacturer Lot No. LRB No. Used Action  VALVE GLAUCOMA AHMED - [id number] Prosthesis & Implant Heart VALVE GLAUCOMA AHMED [id number] [employer] [id number] Left 1 Implanted  TUTOPLAST SCIERA 0.5X1.0 - [id number] Tissue TUTOPLAST SCIERA 0.5X1.0 [id number] RIT  Left 1 Implanted     PROCEDURE: 1. Tube shunt with Ahmed glaucoma valve and Tutoplast. 2. Washout of hyphema from anterior chamber   SURGEON: Wyonia Hough, MD   ANESTHESIA: Retrobulbar block, Xylocaine and bupivacaine.   PROCEDURE: The patient was identified in the holding room and transported to the operating suite and placed in the supine position underneath the operating microscope. The left eye was identified as the operative eye and a retrobulbar block of Xylocaine and bupivacaine was administered under intravenous sedation. It was then prepped and draped in the usual sterile ophthalmic fashion.   A conjunctival peritomy was made 3 mm posterior to the limbus from the 12:00 o'clock to  3:00 o'clock positions. Hemostasis was achieved with wet field cautery. [place] scissors were used with blunt and sharp dissection to dissect a scleral pocket underneath conjunctiva and Tenons in the superotemporal quadrant. A traction suture of 8-0 vicryl was placed trough the superotemporal limbus.   A caliper was used to measure a position 10 mm posterior to the limbus in the superotemporal quadrant. Two 8-0 nylon sutures were placed at this distance in order to anchor the Ahmed glaucoma valve, model FP7. The Ahmed glaucoma valve was inspected and its tube was cannulated with a 27-gauge cannula with  balanced salt solution. Balanced salt solution was then injected into the glaucoma valve to prime it. It was found to be in good working condition. The Ahmed valve was then placed into the superotemporal pocket underneath the conjunctiva and Tenons. It was sutured with the preplaced 8-0 nylon sutures at a position 10 mm posterior to the limbus. This was verified after its placement with a caliper. The tube end was then cut with an anterior bevel to position into the anterior chamber. A paracentesis incision was made through clear cornea at the 3:00 o'clock position. The anterior chamber was filled with Healon. A 22-gauge needle was then used to enter the anterior chamber in the superotemporal quadrant near the limbus. The tube was then placed into the anterior chamber parallel with the iris. There was no corneal or iris touch. An 8-0 nylon suture was used to secure the length of the tube to the sclera. A Tutoplast graft was then cut to fit, 0.6 x 1.0 cm from the limbus over the tube to the area of the plate. This was secured with 4 interrupted 8-0 nylon sutures.  The anterior chamber was rinsed several times with balanced salt through the paracentesis incision to  Remove the majority of the blood in the anterior chamber.  The intraocular lens and inferior anterior chamber could be fully visualized after washout. The anterior chamber was filled with balanced salt solution and the incision was noted to be watertight. Provisc was evacuated from the eye during this. The eye was noted to lower to a low physiologic pressure after being instilled with balanced salt solution. Healon was placed into the anterior chamber to  help prevent hypotony, with a third to half fill. The conjunctiva and Tenons were closed at the limbus using running 9-0 Vicryl suture. Additional balanced salt solution was placed into the anterior chamber. The pressure quickly reduced to a low physiologic pressure of about 10. There were no wound leaks  noted. The conjunctival incision was tight and there was bleb formation. Cefuroxime 0.1 ml of a 10mg /ml solution was injected into the anterior chamber for a dose of 1 mg of intracameral antibiotic at the completion of the case. Topical atropine drops and Maxitrol ointment was applied to the eye. The eye was patched and shielded. The patient was taken to the recovery room in stable condition without complications of anesthesia or surgery.     [name] 12/20/2019, 10:38 AM

## 2019-12-20 NOTE — Anesthesia Procedure Notes (Signed)
Procedure Name: MAC Performed by: Makayli Bracken, CRNA Pre-anesthesia Checklist: Patient identified, Emergency Drugs available, Suction available, Timeout performed and Patient being monitored Patient Re-evaluated:Patient Re-evaluated prior to induction Oxygen Delivery Method: Nasal cannula Placement Confirmation: positive ETCO2       

## 2019-12-22 ENCOUNTER — Other Ambulatory Visit: Payer: Self-pay | Admitting: Family Medicine

## 2019-12-22 DIAGNOSIS — D509 Iron deficiency anemia, unspecified: Secondary | ICD-10-CM | POA: Diagnosis not present

## 2019-12-22 DIAGNOSIS — Z992 Dependence on renal dialysis: Secondary | ICD-10-CM | POA: Diagnosis not present

## 2019-12-22 DIAGNOSIS — E1165 Type 2 diabetes mellitus with hyperglycemia: Secondary | ICD-10-CM

## 2019-12-22 DIAGNOSIS — IMO0002 Reserved for concepts with insufficient information to code with codable children: Secondary | ICD-10-CM

## 2019-12-22 DIAGNOSIS — D631 Anemia in chronic kidney disease: Secondary | ICD-10-CM | POA: Diagnosis not present

## 2019-12-22 DIAGNOSIS — N186 End stage renal disease: Secondary | ICD-10-CM | POA: Diagnosis not present

## 2019-12-22 DIAGNOSIS — N2581 Secondary hyperparathyroidism of renal origin: Secondary | ICD-10-CM | POA: Diagnosis not present

## 2019-12-25 DIAGNOSIS — D631 Anemia in chronic kidney disease: Secondary | ICD-10-CM | POA: Diagnosis not present

## 2019-12-25 DIAGNOSIS — D509 Iron deficiency anemia, unspecified: Secondary | ICD-10-CM | POA: Diagnosis not present

## 2019-12-25 DIAGNOSIS — N186 End stage renal disease: Secondary | ICD-10-CM | POA: Diagnosis not present

## 2019-12-25 DIAGNOSIS — Z992 Dependence on renal dialysis: Secondary | ICD-10-CM | POA: Diagnosis not present

## 2019-12-25 DIAGNOSIS — N2581 Secondary hyperparathyroidism of renal origin: Secondary | ICD-10-CM | POA: Diagnosis not present

## 2019-12-27 DIAGNOSIS — D631 Anemia in chronic kidney disease: Secondary | ICD-10-CM | POA: Diagnosis not present

## 2019-12-27 DIAGNOSIS — N186 End stage renal disease: Secondary | ICD-10-CM | POA: Diagnosis not present

## 2019-12-27 DIAGNOSIS — Z992 Dependence on renal dialysis: Secondary | ICD-10-CM | POA: Diagnosis not present

## 2019-12-27 DIAGNOSIS — N2581 Secondary hyperparathyroidism of renal origin: Secondary | ICD-10-CM | POA: Diagnosis not present

## 2019-12-27 DIAGNOSIS — D509 Iron deficiency anemia, unspecified: Secondary | ICD-10-CM | POA: Diagnosis not present

## 2019-12-29 DIAGNOSIS — D631 Anemia in chronic kidney disease: Secondary | ICD-10-CM | POA: Diagnosis not present

## 2019-12-29 DIAGNOSIS — Z992 Dependence on renal dialysis: Secondary | ICD-10-CM | POA: Diagnosis not present

## 2019-12-29 DIAGNOSIS — N2581 Secondary hyperparathyroidism of renal origin: Secondary | ICD-10-CM | POA: Diagnosis not present

## 2019-12-29 DIAGNOSIS — N186 End stage renal disease: Secondary | ICD-10-CM | POA: Diagnosis not present

## 2019-12-29 DIAGNOSIS — D509 Iron deficiency anemia, unspecified: Secondary | ICD-10-CM | POA: Diagnosis not present

## 2020-01-01 DIAGNOSIS — N186 End stage renal disease: Secondary | ICD-10-CM | POA: Diagnosis not present

## 2020-01-01 DIAGNOSIS — D509 Iron deficiency anemia, unspecified: Secondary | ICD-10-CM | POA: Diagnosis not present

## 2020-01-01 DIAGNOSIS — D631 Anemia in chronic kidney disease: Secondary | ICD-10-CM | POA: Diagnosis not present

## 2020-01-01 DIAGNOSIS — Z992 Dependence on renal dialysis: Secondary | ICD-10-CM | POA: Diagnosis not present

## 2020-01-01 DIAGNOSIS — N2581 Secondary hyperparathyroidism of renal origin: Secondary | ICD-10-CM | POA: Diagnosis not present

## 2020-01-03 DIAGNOSIS — D509 Iron deficiency anemia, unspecified: Secondary | ICD-10-CM | POA: Diagnosis not present

## 2020-01-03 DIAGNOSIS — N186 End stage renal disease: Secondary | ICD-10-CM | POA: Diagnosis not present

## 2020-01-03 DIAGNOSIS — N2581 Secondary hyperparathyroidism of renal origin: Secondary | ICD-10-CM | POA: Diagnosis not present

## 2020-01-03 DIAGNOSIS — D631 Anemia in chronic kidney disease: Secondary | ICD-10-CM | POA: Diagnosis not present

## 2020-01-03 DIAGNOSIS — Z992 Dependence on renal dialysis: Secondary | ICD-10-CM | POA: Diagnosis not present

## 2020-01-05 DIAGNOSIS — D631 Anemia in chronic kidney disease: Secondary | ICD-10-CM | POA: Diagnosis not present

## 2020-01-05 DIAGNOSIS — Z992 Dependence on renal dialysis: Secondary | ICD-10-CM | POA: Diagnosis not present

## 2020-01-05 DIAGNOSIS — N186 End stage renal disease: Secondary | ICD-10-CM | POA: Diagnosis not present

## 2020-01-05 DIAGNOSIS — N2581 Secondary hyperparathyroidism of renal origin: Secondary | ICD-10-CM | POA: Diagnosis not present

## 2020-01-05 DIAGNOSIS — D509 Iron deficiency anemia, unspecified: Secondary | ICD-10-CM | POA: Diagnosis not present

## 2020-01-08 DIAGNOSIS — N2581 Secondary hyperparathyroidism of renal origin: Secondary | ICD-10-CM | POA: Diagnosis not present

## 2020-01-08 DIAGNOSIS — D631 Anemia in chronic kidney disease: Secondary | ICD-10-CM | POA: Diagnosis not present

## 2020-01-08 DIAGNOSIS — Z992 Dependence on renal dialysis: Secondary | ICD-10-CM | POA: Diagnosis not present

## 2020-01-08 DIAGNOSIS — N186 End stage renal disease: Secondary | ICD-10-CM | POA: Diagnosis not present

## 2020-01-08 DIAGNOSIS — D509 Iron deficiency anemia, unspecified: Secondary | ICD-10-CM | POA: Diagnosis not present

## 2020-01-10 DIAGNOSIS — Z992 Dependence on renal dialysis: Secondary | ICD-10-CM | POA: Diagnosis not present

## 2020-01-10 DIAGNOSIS — N2581 Secondary hyperparathyroidism of renal origin: Secondary | ICD-10-CM | POA: Diagnosis not present

## 2020-01-10 DIAGNOSIS — N186 End stage renal disease: Secondary | ICD-10-CM | POA: Diagnosis not present

## 2020-01-10 DIAGNOSIS — D631 Anemia in chronic kidney disease: Secondary | ICD-10-CM | POA: Diagnosis not present

## 2020-01-10 DIAGNOSIS — D509 Iron deficiency anemia, unspecified: Secondary | ICD-10-CM | POA: Diagnosis not present

## 2020-01-11 ENCOUNTER — Encounter (INDEPENDENT_AMBULATORY_CARE_PROVIDER_SITE_OTHER): Payer: Self-pay | Admitting: Vascular Surgery

## 2020-01-11 ENCOUNTER — Ambulatory Visit (INDEPENDENT_AMBULATORY_CARE_PROVIDER_SITE_OTHER): Payer: Medicare Other | Admitting: Vascular Surgery

## 2020-01-11 ENCOUNTER — Ambulatory Visit (INDEPENDENT_AMBULATORY_CARE_PROVIDER_SITE_OTHER): Payer: Medicare Other

## 2020-01-11 ENCOUNTER — Other Ambulatory Visit: Payer: Self-pay

## 2020-01-11 VITALS — BP 156/84 | HR 88 | Resp 16 | Wt 220.0 lb

## 2020-01-11 DIAGNOSIS — I10 Essential (primary) hypertension: Secondary | ICD-10-CM | POA: Diagnosis not present

## 2020-01-11 DIAGNOSIS — I739 Peripheral vascular disease, unspecified: Secondary | ICD-10-CM | POA: Diagnosis not present

## 2020-01-11 DIAGNOSIS — E1122 Type 2 diabetes mellitus with diabetic chronic kidney disease: Secondary | ICD-10-CM | POA: Diagnosis not present

## 2020-01-11 DIAGNOSIS — T829XXS Unspecified complication of cardiac and vascular prosthetic device, implant and graft, sequela: Secondary | ICD-10-CM | POA: Diagnosis not present

## 2020-01-11 DIAGNOSIS — N186 End stage renal disease: Secondary | ICD-10-CM

## 2020-01-11 DIAGNOSIS — Z992 Dependence on renal dialysis: Secondary | ICD-10-CM | POA: Diagnosis not present

## 2020-01-11 NOTE — Progress Notes (Signed)
MRN : [id number]  [name] is a 54 y.o. ([birth year]) female who presents with chief complaint of No chief complaint on file. Marland Kitchen  History of Present Illness:   The patient returns to the office for followup and review status post angiogram without intervention.    She is here to reassess the degree of symptoms that her steal syndrome is creating as well as to review her noninvasive studies for her PAD.  Today she notes her legs are unchanged about the same she does not describe severe claudication or rest pain symptoms.   She fell several days ago getting out of the bus to go to dialysis and sustained abrasions of her left third and fourth fingers.  Continues to have pain in the third fourth and fifth fingers of the left hand but it does not seem as severe.  When she is off dialysis it is significantly less and relatively speaking tolerable however on dialysis she does experience a significant increase and has been hanging her hand off the armrest during dialysis because that makes it feel better.  There are no issues with her dialysis access.  There have been no significant changes to the patient's overall health care.  The patient denies amaurosis fugax or recent TIA symptoms. There are no recent neurological changes noted. The patient denies history of DVT, PE or superficial thrombophlebitis. The patient denies recent episodes of angina or shortness of breath  No outpatient medications have been marked as taking for the 01/11/20 encounter (Appointment) with Delana Meyer, Dolores Lory, MD.    Past Medical History:  Diagnosis Date  . Allergy   . Anemia   . Anxiety    worse when not at home - bowel incontinence  . Arthritis   . Ataxia   . COPD (chronic obstructive pulmonary disease) ([place])   . Depression   . Diabetes mellitus with complication ([place])   . Dialysis patient ([place])   . ESRD on hemodialysis ([place])    MWF dialysis  . Fistula    left upper arm  . Hepatitis 2003   Hep C    . Hiatal hernia   . Hypercholesterolemia   . Hypertension   . Migraine   . Neuropathy, diabetic (HCC)    lower legs  . Non-alcoholic cirrhosis ([place])   . Peripheral vascular disease ([place])   . Pneumonia 2015  . Psoriasis   . Renal insufficiency   . Tobacco dependence   . Wears dentures    full upper, partial lower    Past Surgical History:  Procedure Laterality Date  . A/V SHUNT INTERVENTION N/A 12/16/2017   Procedure: A/V SHUNT INTERVENTION;  Surgeon: Algernon Huxley, MD;  Location: [facility];  Service: Cardiovascular;  Laterality: N/A;  . AQUEOUS SHUNT Left 12/20/2019   Procedure: AHMED TUBE SHUNT WITH TUTOPLAST AND AC WASHOUT LEFT DIABETIC;  Surgeon: Leandrew Koyanagi, MD;  Location: [facility];  Service: Ophthalmology;  Laterality: Left;  Diabetic - insulin  . AV FISTULA PLACEMENT Left 11/2014  . CESAREAN SECTION    . CHOLECYSTECTOMY    . COLONOSCOPY N/A 09/22/2017   Procedure: COLONOSCOPY;  Surgeon: Lin Landsman, MD;  Location: [facility];  Service: Endoscopy;  Laterality: N/A;  . cyst removed  from left hand Left 1989  . DIALYSIS/PERMA CATHETER INSERTION N/A 05/20/2017   Procedure: Dialysis/Perma Catheter Insertion and fistulagram/LUE angiogram;  Surgeon: Algernon Huxley, MD;  Location: [facility];  Service: Cardiovascular;  Laterality: N/A;  .  ESOPHAGOGASTRODUODENOSCOPY N/A 09/22/2017   Procedure: ESOPHAGOGASTRODUODENOSCOPY (EGD);  Surgeon: Lin Landsman, MD;  Location: [facility];  Service: Endoscopy;  Laterality: N/A;  . INCISION AND DRAINAGE ABSCESS N/A 07/16/2016   Procedure: INCISION AND DRAINAGE ABSCESS;  Surgeon: Carloyn Manner, MD;  Location: [facility];  Service: ENT;  Laterality: N/A;  . PERIPHERAL VASCULAR CATHETERIZATION N/A 07/25/2015   Procedure: A/V Shuntogram/Fistulagram;  Surgeon: Algernon Huxley, MD;  Location: [facility];  Service: Cardiovascular;  Laterality: N/A;  . PERIPHERAL VASCULAR  CATHETERIZATION Left 07/25/2015   Procedure: A/V Shunt Intervention;  Surgeon: Algernon Huxley, MD;  Location: [facility];  Service: Cardiovascular;  Laterality: Left;  . PERIPHERAL VASCULAR CATHETERIZATION Left 10/07/2015   Procedure: A/V Shuntogram/Fistulagram;  Surgeon: Algernon Huxley, MD;  Location: [facility];  Service: Cardiovascular;  Laterality: Left;  . PERIPHERAL VASCULAR CATHETERIZATION N/A 10/07/2015   Procedure: A/V Shunt Intervention;  Surgeon: Algernon Huxley, MD;  Location: [facility];  Service: Cardiovascular;  Laterality: N/A;  . PERIPHERAL VASCULAR CATHETERIZATION  10/07/2015   Procedure: Dialysis/Perma Catheter Insertion;  Surgeon: Algernon Huxley, MD;  Location: [facility];  Service: Cardiovascular;;  . PERIPHERAL VASCULAR CATHETERIZATION N/A 12/17/2015   Procedure: Dialysis/Perma Catheter Removal;  Surgeon: Katha Cabal, MD;  Location: [facility];  Service: Cardiovascular;  Laterality: N/A;  . PERIPHERAL VASCULAR CATHETERIZATION N/A 01/11/2017   Procedure: Visceral Angiography;  Surgeon: Algernon Huxley, MD;  Location: [facility];  Service: Cardiovascular;  Laterality: N/A;  . PERIPHERAL VASCULAR CATHETERIZATION N/A 01/11/2017   Procedure: Visceral Artery Intervention;  Surgeon: Algernon Huxley, MD;  Location: [facility];  Service: Cardiovascular;  Laterality: N/A;  . POLYPECTOMY  09/22/2017   Procedure: POLYPECTOMY;  Surgeon: Lin Landsman, MD;  Location: [facility];  Service: Endoscopy;;  . rt. tubal and ovary removed    . STENT PLACEMENT VASCULAR ([place] HX) Left 03/2018   Performed at [facility] using EV3 protg GPS stent graph [id number] lot [id number]  . TONSILLECTOMY    . TUBAL LIGATION    . UPPER EXTREMITY ANGIOGRAPHY Left 09/29/2019   Procedure: UPPER EXTREMITY ANGIOGRAPHY;  Surgeon: Katha Cabal, MD;  Location: [facility];  Service: Cardiovascular;  Laterality: Left;      Social History Social History   Tobacco Use  . Smoking status: Light Tobacco Smoker    Packs/day: 0.25    Years: 20.00    Pack years: 5.00    Types: Cigarettes    Last attempt to quit: 05/21/2017    Years since quitting: 2.6  . Smokeless tobacco: Never Used  Substance Use Topics  . Alcohol use: No  . Drug use: No    Family History Family History  Problem Relation Age of Onset  . Hypertension Mother   . Heart disease Mother   . Hypertension Father   . Skin cancer Father     Allergies  Allergen Reactions  . Cinnamon Anaphylaxis  . Garlic Anaphylaxis and Hives  . Onion Hives and Swelling  . Tylenol [Acetaminophen] Anaphylaxis  . Ciprofloxacin Diarrhea  . Prednisone Other (See Comments)    Vaginal blisters & oral blisters      REVIEW OF SYSTEMS (Negative unless checked)  Constitutional: [] Weight loss  [] Fever  [] Chills Cardiac: [] Chest pain   [] Chest pressure   [] Palpitations   [] Shortness of breath when laying flat   [] Shortness of breath with exertion. Vascular:  []   Pain in legs with walking   [] Pain in legs at rest  [] History of DVT   [] Phlebitis   [] Swelling in legs   [] Varicose veins   [] Non-healing ulcers Pulmonary:   [] Uses home oxygen   [] Productive cough   [] Hemoptysis   [] Wheeze  [] COPD   [] Asthma Neurologic:  [] Dizziness   [] Seizures   [] History of stroke   [] History of TIA  [] Aphasia   [] Vissual changes   [] Weakness or numbness in arm   [] Weakness or numbness in leg Musculoskeletal:   [] Joint swelling   [] Joint pain   [] Low back pain Hematologic:  [] Easy bruising  [] Easy bleeding   [] Hypercoagulable state   [] Anemic Gastrointestinal:  [] Diarrhea   [] Vomiting  [] Gastroesophageal reflux/heartburn   [] Difficulty swallowing. Genitourinary:  [x] Chronic kidney disease   [] Difficult urination  [] Frequent urination   [] Blood in urine Skin:  [] Rashes   [] Ulcers  Psychological:  [] History of anxiety   []  History of major depression.  Physical Examination  There  were no vitals filed for this visit. There is no height or weight on file to calculate BMI. Gen: WD/WN, NAD Head: [place]/AT, No temporalis wasting.  Ear/Nose/Throat: Hearing grossly intact, nares w/o erythema or drainage Eyes: PER, EOMI, sclera nonicteric.  Neck: Supple, no large masses.   Pulmonary:  Good air movement, no audible wheezing bilaterally, no use of accessory muscles.  Cardiac: RRR, no JVD Vascular: left arm access good thrill good bruit Vessel Right Left  Radial Palpable Not Palpable  Ulnar Palpable Not Palpable  Brachial Palpable Palpable  Gastrointestinal: Non-distended. No guarding/no peritoneal signs.  Musculoskeletal: M/S 5/5 throughout.  No deformity or atrophy.  Neurologic: CN 2-12 intact. Symmetrical.  Speech is fluent. Motor exam as listed above. Psychiatric: Judgment intact, Mood & affect appropriate for pt's clinical situation. Dermatologic: No rashes or ulcers noted.  No changes consistent with cellulitis. Lymph : No lichenification or skin changes of chronic lymphedema.  CBC Lab Results  Component Value Date   WBC 6.4 05/01/2019   HGB 9.3 (L) 05/01/2019   HCT 27.8 (L) 05/01/2019   MCV 91.4 05/01/2019   PLT 78 (L) 05/01/2019    BMET    Component Value Date/Time   NA 128 (L) 05/01/2019 0015   NA 136 (A) 12/08/2017 0000   NA 133 (L) 10/31/2014 0955   K 5.2 (H) 05/01/2019 0015   K 4.7 10/31/2014 0955   CL 96 (L) 05/01/2019 0015   CL 102 10/31/2014 0955   CO2 21 (L) 05/01/2019 0015   CO2 27 10/31/2014 0955   GLUCOSE 152 (H) 05/01/2019 0015   GLUCOSE 203 (H) 10/31/2014 0955   BUN 52 (H) 05/01/2019 0015   BUN 35 (H) 10/31/2014 0955   CREATININE 7.50 (H) 05/01/2019 0015   CREATININE 7.43 (H) 12/27/2017 1035   CALCIUM 7.7 (L) 05/01/2019 0015   CALCIUM 7.7 (L) 10/31/2014 0955   GFRNONAA 6 (L) 05/01/2019 0015   GFRNONAA 6 (L) 12/27/2017 1035   GFRAA 6 (L) 05/01/2019 0015   GFRAA 7 (L) 12/27/2017 1035   CrCl cannot be calculated (Patient's most  recent lab result is older than the maximum 21 days allowed.).  COAG Lab Results  Component Value Date   INR 1.2 04/30/2019   INR 1.05 04/07/2018   INR 1.01 10/12/2017    Radiology No results found.    Assessment/Plan 1. Complication from renal dialysis device, sequela Recommend:  The patient is doing well and currently has adequate dialysis access. The patient's dialysis center is not  reporting any access issues. Flow pattern is stable when compared to the prior ultrasound.  The patient should have a duplex ultrasound of the dialysis access in 3 months. The patient will follow-up with me in the office after each ultrasound   - VAS [place] (AVF, AVG); Future  2. PAD (peripheral artery disease) (HCC)  Recommend:  The patient has evidence of atherosclerosis of the lower extremities with claudication.  The patient does not voice lifestyle limiting changes at this point in time.  Noninvasive studies do not suggest clinically significant change.  No invasive studies, angiography or surgery at this time The patient should continue walking and begin a more formal exercise program.  The patient should continue antiplatelet therapy and aggressive treatment of the lipid abnormalities  No changes in the patient's medications at this time  The patient should continue wearing graduated compression socks 10-15 mmHg strength to control the mild edema.    3. Essential hypertension Continue antihypertensive medications as already ordered, these medications have been reviewed and there are no changes at this time.   4. Type 2 diabetes mellitus with ESRD (end-stage renal disease) ([place]) Continue hypoglycemic medications as already ordered, these medications have been reviewed and there are no changes at this time.  Hgb A1C to be monitored as already arranged by primary service   5. ESRD (end stage renal disease) on dialysis ([place]) At the present time the patient  has adequate dialysis access.  Continue hemodialysis as ordered without interruption.  Avoid nephrotoxic medications and dehydration.  Further plans per nephrology   Hortencia Pilar, MD  01/11/2020 1:10 PM

## 2020-01-12 DIAGNOSIS — Z992 Dependence on renal dialysis: Secondary | ICD-10-CM | POA: Diagnosis not present

## 2020-01-12 DIAGNOSIS — D631 Anemia in chronic kidney disease: Secondary | ICD-10-CM | POA: Diagnosis not present

## 2020-01-12 DIAGNOSIS — N2581 Secondary hyperparathyroidism of renal origin: Secondary | ICD-10-CM | POA: Diagnosis not present

## 2020-01-12 DIAGNOSIS — N186 End stage renal disease: Secondary | ICD-10-CM | POA: Diagnosis not present

## 2020-01-12 DIAGNOSIS — D509 Iron deficiency anemia, unspecified: Secondary | ICD-10-CM | POA: Diagnosis not present

## 2020-01-15 ENCOUNTER — Other Ambulatory Visit: Payer: Self-pay | Admitting: Family Medicine

## 2020-01-15 DIAGNOSIS — D631 Anemia in chronic kidney disease: Secondary | ICD-10-CM | POA: Diagnosis not present

## 2020-01-15 DIAGNOSIS — Z794 Long term (current) use of insulin: Secondary | ICD-10-CM | POA: Diagnosis not present

## 2020-01-15 DIAGNOSIS — E1122 Type 2 diabetes mellitus with diabetic chronic kidney disease: Secondary | ICD-10-CM

## 2020-01-15 DIAGNOSIS — N2581 Secondary hyperparathyroidism of renal origin: Secondary | ICD-10-CM | POA: Diagnosis not present

## 2020-01-15 DIAGNOSIS — D509 Iron deficiency anemia, unspecified: Secondary | ICD-10-CM | POA: Diagnosis not present

## 2020-01-15 DIAGNOSIS — E119 Type 2 diabetes mellitus without complications: Secondary | ICD-10-CM | POA: Diagnosis not present

## 2020-01-15 DIAGNOSIS — N186 End stage renal disease: Secondary | ICD-10-CM | POA: Diagnosis not present

## 2020-01-15 DIAGNOSIS — Z992 Dependence on renal dialysis: Secondary | ICD-10-CM | POA: Diagnosis not present

## 2020-01-15 LAB — HEMOGLOBIN A1C: Hemoglobin A1C: 8.8

## 2020-01-17 DIAGNOSIS — N2581 Secondary hyperparathyroidism of renal origin: Secondary | ICD-10-CM | POA: Diagnosis not present

## 2020-01-17 DIAGNOSIS — N186 End stage renal disease: Secondary | ICD-10-CM | POA: Diagnosis not present

## 2020-01-17 DIAGNOSIS — D509 Iron deficiency anemia, unspecified: Secondary | ICD-10-CM | POA: Diagnosis not present

## 2020-01-17 DIAGNOSIS — D631 Anemia in chronic kidney disease: Secondary | ICD-10-CM | POA: Diagnosis not present

## 2020-01-17 DIAGNOSIS — Z992 Dependence on renal dialysis: Secondary | ICD-10-CM | POA: Diagnosis not present

## 2020-01-18 ENCOUNTER — Other Ambulatory Visit: Payer: Self-pay | Admitting: Family Medicine

## 2020-01-18 DIAGNOSIS — IMO0002 Reserved for concepts with insufficient information to code with codable children: Secondary | ICD-10-CM

## 2020-01-18 DIAGNOSIS — E1165 Type 2 diabetes mellitus with hyperglycemia: Secondary | ICD-10-CM

## 2020-01-19 DIAGNOSIS — N2581 Secondary hyperparathyroidism of renal origin: Secondary | ICD-10-CM | POA: Diagnosis not present

## 2020-01-19 DIAGNOSIS — D631 Anemia in chronic kidney disease: Secondary | ICD-10-CM | POA: Diagnosis not present

## 2020-01-19 DIAGNOSIS — N186 End stage renal disease: Secondary | ICD-10-CM | POA: Diagnosis not present

## 2020-01-19 DIAGNOSIS — D509 Iron deficiency anemia, unspecified: Secondary | ICD-10-CM | POA: Diagnosis not present

## 2020-01-19 DIAGNOSIS — Z992 Dependence on renal dialysis: Secondary | ICD-10-CM | POA: Diagnosis not present

## 2020-01-21 DIAGNOSIS — Z992 Dependence on renal dialysis: Secondary | ICD-10-CM | POA: Diagnosis not present

## 2020-01-21 DIAGNOSIS — N186 End stage renal disease: Secondary | ICD-10-CM | POA: Diagnosis not present

## 2020-01-22 ENCOUNTER — Other Ambulatory Visit: Payer: Self-pay

## 2020-01-22 DIAGNOSIS — N186 End stage renal disease: Secondary | ICD-10-CM | POA: Diagnosis not present

## 2020-01-22 DIAGNOSIS — Z992 Dependence on renal dialysis: Secondary | ICD-10-CM | POA: Diagnosis not present

## 2020-01-22 DIAGNOSIS — N2581 Secondary hyperparathyroidism of renal origin: Secondary | ICD-10-CM | POA: Diagnosis not present

## 2020-01-22 DIAGNOSIS — D509 Iron deficiency anemia, unspecified: Secondary | ICD-10-CM | POA: Diagnosis not present

## 2020-01-22 DIAGNOSIS — D631 Anemia in chronic kidney disease: Secondary | ICD-10-CM | POA: Diagnosis not present

## 2020-01-22 MED ORDER — DILTIAZEM HCL 60 MG PO TABS: 60.0000 mg | ORAL_TABLET | Freq: Three times a day (TID) | ORAL | 0 refills | Status: DC

## 2020-01-23 ENCOUNTER — Ambulatory Visit (INDEPENDENT_AMBULATORY_CARE_PROVIDER_SITE_OTHER): Payer: Medicare Other

## 2020-01-23 VITALS — Ht 67.0 in | Wt 220.0 lb

## 2020-01-23 DIAGNOSIS — Z0000 Encounter for general adult medical examination without abnormal findings: Secondary | ICD-10-CM

## 2020-01-23 NOTE — Progress Notes (Signed)
Subjective:   TAMARA MONTEITH is a 54 y.o. female who presents for Medicare Annual (Subsequent) preventive examination.  This visit is being conducted via phone call  - after an attmept to do on video chat - due to the COVID-19 pandemic. This patient has given me verbal consent via phone to conduct this visit, patient states they are participating from their home address. Some vital signs may be absent or patient reported.   Patient identification: identified by name, DOB, and current address.    Review of Systems:   Cardiac Risk Factors include: advanced age (>72men, >60 women);dyslipidemia;hypertension;diabetes mellitus;smoking/ tobacco exposure;obesity (BMI >30kg/m2)     Objective:     Vitals: Ht 5\' 7"  (1.702 m)   Wt 220 lb (99.8 kg)   BMI 34.46 kg/m   Body mass index is 34.46 kg/m.  Advanced Directives 01/23/2020 12/20/2019 04/29/2019 02/14/2019 12/07/2018 12/16/2017 12/14/2017  Does Patient Have a Medical Advance Directive? No No No No No - No  Would patient like information on creating a medical advance directive? - Yes (MAU/Ambulatory/Procedural Areas - Information given) No - Patient declined Yes (MAU/Ambulatory/Procedural Areas - Information given) No - Patient declined No - Patient declined -    Tobacco Social History   Tobacco Use  Smoking Status Light Tobacco Smoker  . Packs/day: 0.25  . Years: 20.00  . Pack years: 5.00  . Types: Cigarettes  . Last attempt to quit: 05/21/2017  . Years since quitting: 2.6  Smokeless Tobacco Never Used     Ready to quit: Yes Counseling given: Yes   Clinical Intake:  Pre-visit preparation completed: Yes  Pain : 0-10 Pain Score: 6  Pain Type: Chronic pain Pain Location: Back Pain Orientation: Lower, Upper Pain Descriptors / Indicators: Aching Pain Frequency: Constant     Nutritional Risks: None           Past Medical History:  Diagnosis Date  . Allergy   . Anemia   . Anxiety    worse when not at home - bowel  incontinence  . Arthritis   . Ataxia   . COPD (chronic obstructive pulmonary disease) ([place])   . Depression   . Diabetes mellitus with complication ([place])   . Dialysis patient ([place])   . ESRD on hemodialysis ([place])    MWF dialysis  . Fistula    left upper arm  . Hepatitis 2003   Hep C  . Hiatal hernia   . Hypercholesterolemia   . Hypertension   . Migraine   . Neuropathy, diabetic ([facility])    lower legs  . Non-alcoholic cirrhosis ([place])   . Peripheral vascular disease ([place])   . Pneumonia 2015  . Psoriasis   . Renal insufficiency   . Tobacco dependence   . Wears dentures    full upper, partial lower   Past Surgical History:  Procedure Laterality Date  . A/V SHUNT INTERVENTION N/A 12/16/2017   Procedure: A/V SHUNT INTERVENTION;  Surgeon: Algernon Huxley, MD;  Location: [facility];  Service: Cardiovascular;  Laterality: N/A;  . AQUEOUS SHUNT Left 12/20/2019   Procedure: AHMED TUBE SHUNT WITH TUTOPLAST AND AC WASHOUT LEFT DIABETIC;  Surgeon: Leandrew Koyanagi, MD;  Location: [facility];  Service: Ophthalmology;  Laterality: Left;  Diabetic - insulin  . AV FISTULA PLACEMENT Left 11/2014  . CESAREAN SECTION    . CHOLECYSTECTOMY    . COLONOSCOPY N/A 09/22/2017   Procedure: COLONOSCOPY;  Surgeon: Lin Landsman, MD;  Location: [facility];  Service: Endoscopy;  Laterality: N/A;  . cyst removed  from left hand Left 1989  . DIALYSIS/PERMA CATHETER INSERTION N/A 05/20/2017   Procedure: Dialysis/Perma Catheter Insertion and fistulagram/LUE angiogram;  Surgeon: Algernon Huxley, MD;  Location: [facility];  Service: Cardiovascular;  Laterality: N/A;  . ESOPHAGOGASTRODUODENOSCOPY N/A 09/22/2017   Procedure: ESOPHAGOGASTRODUODENOSCOPY (EGD);  Surgeon: Lin Landsman, MD;  Location: [facility];  Service: Endoscopy;  Laterality: N/A;  . INCISION AND DRAINAGE ABSCESS N/A 07/16/2016   Procedure: INCISION AND DRAINAGE ABSCESS;  Surgeon: Carloyn Manner,  MD;  Location: [facility];  Service: ENT;  Laterality: N/A;  . PERIPHERAL VASCULAR CATHETERIZATION N/A 07/25/2015   Procedure: A/V Shuntogram/Fistulagram;  Surgeon: Algernon Huxley, MD;  Location: [facility];  Service: Cardiovascular;  Laterality: N/A;  . PERIPHERAL VASCULAR CATHETERIZATION Left 07/25/2015   Procedure: A/V Shunt Intervention;  Surgeon: Algernon Huxley, MD;  Location: [place] [place] [facility];  Service: Cardiovascular;  Laterality: Left;  . PERIPHERAL VASCULAR CATHETERIZATION Left 10/07/2015   Procedure: A/V Shuntogram/Fistulagram;  Surgeon: Algernon Huxley, MD;  Location: [place] [facility];  Service: Cardiovascular;  Laterality: Left;  . PERIPHERAL VASCULAR CATHETERIZATION N/A 10/07/2015   Procedure: A/V Shunt Intervention;  Surgeon: Algernon Huxley, MD;  Location: [facility] [facility];  Service: Cardiovascular;  Laterality: N/A;  . PERIPHERAL VASCULAR CATHETERIZATION  10/07/2015   Procedure: Dialysis/Perma Catheter Insertion;  Surgeon: Algernon Huxley, MD;  Location: [facility] [facility];  Service: Cardiovascular;;  . PERIPHERAL VASCULAR CATHETERIZATION N/A 12/17/2015   Procedure: Dialysis/Perma Catheter Removal;  Surgeon: Katha Cabal, MD;  Location: [facility];  Service: Cardiovascular;  Laterality: N/A;  . PERIPHERAL VASCULAR CATHETERIZATION N/A 01/11/2017   Procedure: Visceral Angiography;  Surgeon: Algernon Huxley, MD;  Location: [facility];  Service: Cardiovascular;  Laterality: N/A;  . PERIPHERAL VASCULAR CATHETERIZATION N/A 01/11/2017   Procedure: Visceral Artery Intervention;  Surgeon: Algernon Huxley, MD;  Location: [facility];  Service: Cardiovascular;  Laterality: N/A;  . POLYPECTOMY  09/22/2017   Procedure: POLYPECTOMY;  Surgeon: Lin Landsman, MD;  Location: [place];  Service: Endoscopy;;  . rt. tubal and ovary removed    . STENT PLACEMENT VASCULAR ([place] [facility]) Left 03/2018   Performed at [facility] using EV3 protg GPS  stent graph [id number] lot [id number]  . TONSILLECTOMY    . TUBAL LIGATION    . UPPER EXTREMITY ANGIOGRAPHY Left 09/29/2019   Procedure: UPPER EXTREMITY ANGIOGRAPHY;  Surgeon: Katha Cabal, MD;  Location: [facility];  Service: Cardiovascular;  Laterality: Left;   Family History  Problem Relation Age of Onset  . Hypertension Mother   . Heart disease Mother   . Hypertension Father   . Skin cancer Father    Social History   Socioeconomic History  . Marital status: Legally Separated    Spouse name: Not on file  . Number of children: Not on file  . Years of education: Not on file  . Highest education level: Not on file  Occupational History  . Occupation: disabled  Tobacco Use  . Smoking status: Light Tobacco Smoker    Packs/day: 0.25    Years: 20.00    Pack years: 5.00    Types: Cigarettes    Last attempt to quit: 05/21/2017    Years since quitting: 2.6  . Smokeless tobacco: Never Used  Substance and Sexual Activity  . Alcohol use: No  . Drug use:  No  . Sexual activity: Not on file  Other Topics Concern  . Not on file  Social History Narrative   Patient lives at an extended stay hotel currently.     Social Determinants of Health   Financial Resource Strain:   . Difficulty of Paying Living Expenses: Not on file  Food Insecurity:   . Worried About Charity fundraiser in the Last Year: Not on file  . Ran Out of Food in the Last Year: Not on file  Transportation Needs:   . Lack of Transportation (Medical): Not on file  . Lack of Transportation (Non-Medical): Not on file  Physical Activity:   . Days of Exercise per Week: Not on file  . Minutes of Exercise per Session: Not on file  Stress:   . Feeling of Stress : Not on file  Social Connections:   . Frequency of Communication with Friends and Family: Not on file  . Frequency of Social Gatherings with Friends and Family: Not on file  . Attends Religious Services: Not on file  . Active Member of Clubs or  Organizations: Not on file  . Attends Archivist Meetings: Not on file  . Marital Status: Not on file    Outpatient Encounter Medications as of 01/23/2020  Medication Sig  . Adalimumab (HUMIRA) 40 MG/0.4ML PSKT Inject 40 mg into the skin every 14 (fourteen) days.  Marland Kitchen amitriptyline (ELAVIL) 25 MG tablet Take 25 mg by mouth at bedtime.  Marland Kitchen atorvastatin (LIPITOR) 20 MG tablet Take 1 tablet (20 mg total) by mouth at bedtime.  . brimonidine (ALPHAGAN) 0.2 % ophthalmic solution Place 1 drop into the left eye 3 (three) times daily.  . calcium acetate (PHOSLO) 667 MG capsule Take 1,334 mg by mouth 3 (three) times daily with meals.   Marland Kitchen diltiazem (CARDIZEM) 60 MG tablet Take 1 tablet (60 mg total) by mouth 3 (three) times daily.  . dorzolamidel-timolol (COSOPT) 22.3-6.8 MG/ML SOLN ophthalmic solution 1 drop 2 (two) times daily.  . ferric citrate (AURYXIA) 1 GM 210 MG(Fe) tablet Take 420 mg by mouth 3 (three) times daily with meals.  Marland Kitchen glucose blood (TRUE METRIX BLOOD GLUCOSE TEST) test strip Use as instructed  . insulin aspart (NOVOLOG) 100 UNIT/ML injection Inject 2 Units into the skin 3 (three) times daily before meals.  Marland Kitchen ketoconazole (NIZORAL) 2 % cream APPLY TOPICALLY ONTO THE SKIN TWICE A DAY AS NEEDED (Patient taking differently: Apply 1 application topically daily. Applied to feet)  . LANTUS 100 UNIT/ML injection INJECT 10 UNITS SUBCUTANEOUSLY ONCE A DAY  . levocetirizine (XYZAL) 5 MG tablet Take 1 tablet (5 mg total) by mouth every evening.  Marland Kitchen omeprazole (PRILOSEC) 40 MG capsule Take 40 mg by mouth daily.  Marland Kitchen oxyCODONE (OXY IR/ROXICODONE) 5 MG immediate release tablet Take 1 tablet (5 mg total) by mouth 2 (two) times daily as needed for severe pain. Must last 30 days.  Marland Kitchen torsemide (DEMADEX) 100 MG tablet Take 100 mg by mouth daily.  Karen Chafe INSULIN SYRINGE 29G X 1/2" 0.5 ML MISC USE 4 TIMES A DAY WITH LANTUS & NOVOLOG  . VENTOLIN HFA 108 (90 Base) MCG/ACT inhaler Inhale 2 puffs into  the lungs 4 (four) times daily as needed for wheezing or shortness of breath.   Derrill Memo ON 02/21/2020] oxyCODONE (OXY IR/ROXICODONE) 5 MG immediate release tablet Take 1 tablet (5 mg total) by mouth 2 (two) times daily as needed for severe pain. Must last 30 days. (Patient not taking:  Reported on 01/23/2020)  . prednisoLONE acetate (PRED FORTE) 1 % ophthalmic suspension   . [DISCONTINUED] atropine 1 % ophthalmic solution Place 1 drop into the left eye 2 (two) times daily.  . [DISCONTINUED] erythromycin ophthalmic ointment 1 application at bedtime.  . [DISCONTINUED] Netarsudil Dimesylate (RHOPRESSA) 0.02 % SOLN Apply to eye.   No facility-administered encounter medications on file as of 01/23/2020.    Activities of Daily Living In your present state of health, do you have any difficulty performing the following activities: 01/23/2020 12/20/2019  Hearing? Y N  Comment some hearing loss, no hearing aids -  Vision? N N  Comment limited vision out of left eye/ glaucoma, goes to [facility] -  Difficulty concentrating or making decisions? N N  Walking or climbing stairs? Y N  Dressing or bathing? Y N  Doing errands, shopping? N -  Comment sometimes, uses [employer] [facility] -  Conservation officer, nature and eating ? N -  Using the Toilet? N -  In the past six months, have you accidently leaked urine? N -  Do you have problems with loss of bowel control? Y -  Comment depends -  Managing your Medications? Y -  Managing your Finances? Y -  Housekeeping or managing your Housekeeping? Y -  Some recent data might be hidden    Patient Care Team: Olin Hauser, DO as PCP - General (Family Medicine) Wellington Hampshire, MD as PCP - Cardiology (Cardiology) Vernon Prey, MD as Referring Physician (Ophthalmology) Magnus Sinning, MD as Referring Physician (Nephrology) Sharlotte Alamo, DPM as Consulting Physician (Podiatry) Dew, Erskine Squibb, MD as Referring Physician (Vascular Surgery) [place],  Virl Diamond, [facility] as Pharmacist    Assessment:   This is a routine wellness examination for [place].  Exercise Activities and Dietary recommendations Current Exercise Habits: Home exercise routine, Time (Minutes): 10, Frequency (Times/Week): 3, Weekly Exercise (Minutes/Week): 30, Intensity: Mild, Exercise limited by: None identified  Goals   None     Fall Risk: Fall Risk  01/23/2020 05/29/2019 02/14/2019 12/27/2018 12/07/2018  Falls in the past year? 1 0 0 0 0  Number falls in past yr: 1 - - - 0  Injury with Fall? 1 - - - 0  Risk for fall due to : Impaired balance/gait;Impaired vision - - - -  Follow up Falls prevention discussed Falls evaluation completed - - -    FALL RISK PREVENTION PERTAINING TO THE HOME:  Any stairs in or around the home? Yes  If so, are there any without handrails? No   Home free of loose throw rugs in walkways, pet beds, electrical cords, etc? Yes  Adequate lighting in your home to reduce risk of falls? Yes   ASSISTIVE DEVICES UTILIZED TO PREVENT FALLS:  Life alert? No  Use of a cane, walker or w/c? Yes cane and walker  Grab bars in the bathroom? Yes  Shower chair or bench in shower? No  Elevated toilet seat or a handicapped toilet? Yes   DME ORDERS:  DME order needed?  No   TIMED UP AND GO:  Unable to perform    Depression Screen PHQ 2/9 Scores 01/23/2020 05/29/2019 02/14/2019 12/07/2018  PHQ - 2 Score 0 - 0 0  PHQ- 9 Score - - - -  Exception Documentation - Patient refusal - -     Cognitive Function        Immunization History  Administered Date(s) Administered  . Hep A / Hep B 08/08/2014  .  Hepatitis B, adult 06/17/2015, 07/15/2015, 08/19/2015, 12/20/2015, 06/04/2017, 07/05/2017, 08/02/2017  . Influenza-Unspecified 09/20/2014, 08/30/2015, 09/09/2016, 10/11/2017, 10/07/2018, 09/20/2019  . PPD Test 02/18/2015, 02/12/2016, 05/17/2017, 05/04/2018, 04/24/2019  . Pneumococcal Polysaccharide-23 09/20/2012  . Pneumococcal-Unspecified 06/26/2015     Qualifies for Shingles Vaccine? Yes  Zostavax completed n/a. Due for Shingrix. Education has been provided regarding the importance of this vaccine. Pt has been advised to call insurance company to determine out of pocket expense. Advised may also receive vaccine at local pharmacy or Health Dept. Verbalized acceptance and understanding.  Tdap: Discussed need for TD/TDAP vaccine, patient verbalized understanding that this is not covered as a preventative with there insurance and to call the office if he gets develops any new skin injuries, ie: cuts, scrapes, bug bites, or open wounds..  Flu Vaccine: up to date   Pneumococcal Vaccine: up to date    Screening Tests Health Maintenance  Topic Date Due  . PAP SMEAR-Modifier  09/13/1985  . OPHTHALMOLOGY EXAM  03/15/2019  . HEMOGLOBIN A1C  11/28/2019  . MAMMOGRAM  01/22/2021 (Originally 09/13/2014)  . TETANUS/TDAP  01/22/2021 (Originally 09/14/1983)  . FOOT EXAM  02/22/2020  . COLONOSCOPY  09/23/2027  . INFLUENZA VACCINE  Completed  . PNEUMOCOCCAL POLYSACCHARIDE VACCINE AGE 27-64 HIGH RISK  Completed  . Hepatitis C Screening  Completed  . HIV Screening  Completed    Cancer Screenings:  Colorectal Screening: Completed 09/22/2017. Repeat every 10 years  Mammogram: declined  Bone Density: not indicated   Lung Cancer Screening: (Low Dose CT Chest recommended if Age 71-80 years, 30 pack-year currently smoking OR have quit w/in 15years.) does not qualify.   Lung Cancer Screening Referral: An Epic message has been sent to Burgess Estelle, RN (Oncology Nurse Navigator) regarding the possible need for this exam. Raquel Sarna will review the patient's chart to determine if the patient truly qualifies for the exam. If the patient qualifies, Raquel Sarna will order the Low Dose CT of the chest to facilitate the scheduling of this exam.  Additional Screening:  Hepatitis C Screening: does qualify; Completed 07/11/2019  Vision Screening: Recommended annual  ophthalmology exams for early detection of glaucoma and other disorders of the eye. Is the patient up to date with their annual eye exam?  Yes  Who is the provider or what is the name of the office in which the pt attends annual eye exams? [facility]    Dental Screening: Recommended annual dental exams for proper oral hygiene  Community Resource Referral:  CRR required this visit?  Yes   Hearing aid assistance      Plan:  I have personally reviewed and addressed the Medicare Annual Wellness questionnaire and have noted the following in the patient's chart:  A. Medical and social history B. Use of alcohol, tobacco or illicit drugs  C. Current medications and supplements D. Functional ability and status E.  Nutritional status F.  Physical activity G. Advance directives H. List of other physicians I.  Hospitalizations, surgeries, and ER visits in previous 12 months J.  [place] such as hearing and vision if needed, cognitive and depression L. Referrals and appointments   In addition, I have reviewed and discussed with patient certain preventive protocols, quality metrics, and best practice recommendations. A written personalized care plan for preventive services as well as general preventive health recommendations were provided to patient.  Signed,    Bevelyn Ngo, LPN  07/24/6961 Nurse Health Advisor   Nurse Notes: needs rx for diabetic shoes. Last rx  was 2 years ago.

## 2020-01-23 NOTE — Patient Instructions (Signed)
[name] , Thank you for taking time to come for your Medicare Wellness Visit. I appreciate your ongoing commitment to your health goals. Please review the following plan we discussed and let me know if I can assist you in the future.   Screening recommendations/referrals: Colonoscopy: up to date  Mammogram: declined  Bone Density: not indicated  Recommended yearly ophthalmology/optometry visit for glaucoma screening and checkup Recommended yearly dental visit for hygiene and checkup  Vaccinations: Influenza vaccine: up to date  Pneumococcal vaccine: up to date  Tdap vaccine: due now  Shingles vaccine: shingrix eligible     Advanced directives: Advance directive discussed with you today.Once this is complete please bring a copy in to our office so we can scan it into your chart.  Conditions/risks identified: If you wish to quit smoking, help is available. For free tobacco cessation program offerings call the [facility] at 325-620-2716 or Live Well Line at [phone]. You may also visit [email or web address] or email [email or web address] for more information on other programs.   Next appointment: Follow up in one year for your annual wellness visit  Preventive Care 40-64 Years, Female Preventive care refers to lifestyle choices and visits with your health care provider that can promote health and wellness. What does preventive care include?  A yearly physical exam. This is also called an annual well check.  Dental exams once or twice a year.  Routine eye exams. Ask your health care provider how often you should have your eyes checked.  Personal lifestyle choices, including:  Daily care of your teeth and gums.  Regular physical activity.  Eating a healthy diet.  Avoiding tobacco and drug use.  Limiting alcohol use.  Practicing safe sex.  Taking low-dose aspirin daily starting at age 48.  Taking vitamin and mineral supplements as recommended by  your health care provider. What happens during an annual well check? The services and screenings done by your health care provider during your annual well check will depend on your age, overall health, lifestyle risk factors, and family history of disease. Counseling  Your health care provider may ask you questions about your:  Alcohol use.  Tobacco use.  Drug use.  Emotional well-being.  Home and relationship well-being.  Sexual activity.  Eating habits.  Work and work Statistician.  Method of birth control.  Menstrual cycle.  Pregnancy history. Screening  You may have the following tests or measurements:  Height, weight, and BMI.  Blood pressure.  Lipid and cholesterol levels. These may be checked every 5 years, or more frequently if you are over 13 years old.  Skin check.  Lung cancer screening. You may have this screening every year starting at age 54 if you have a 30-pack-year history of smoking and currently smoke or have quit within the past 15 years.  Fecal occult blood test (FOBT) of the stool. You may have this test every year starting at age 37.  Flexible sigmoidoscopy or colonoscopy. You may have a sigmoidoscopy every 5 years or a colonoscopy every 10 years starting at age 26.  Hepatitis C blood test.  Hepatitis B blood test.  Sexually transmitted disease (STD) testing.  Diabetes screening. This is done by checking your blood sugar (glucose) after you have not eaten for a while (fasting). You may have this done every 1-3 years.  Mammogram. This may be done every 1-2 years. Talk to your health care provider about when you should start having regular mammograms. This may depend on  whether you have a family history of breast cancer.  BRCA-related cancer screening. This may be done if you have a family history of breast, ovarian, tubal, or peritoneal cancers.  Pelvic exam and Pap test. This may be done every 3 years starting at age 8. Starting at age  90, this may be done every 5 years if you have a Pap test in combination with an HPV test.  Bone density scan. This is done to screen for osteoporosis. You may have this scan if you are at high risk for osteoporosis. Discuss your test results, treatment options, and if necessary, the need for more tests with your health care provider. Vaccines  Your health care provider may recommend certain vaccines, such as:  Influenza vaccine. This is recommended every year.  Tetanus, diphtheria, and acellular pertussis (Tdap, Td) vaccine. You may need a Td booster every 10 years.  Zoster vaccine. You may need this after age 23.  Pneumococcal 13-valent conjugate (PCV13) vaccine. You may need this if you have certain conditions and were not previously vaccinated.  Pneumococcal polysaccharide (PPSV23) vaccine. You may need one or two doses if you smoke cigarettes or if you have certain conditions. Talk to your health care provider about which screenings and vaccines you need and how often you need them. This information is not intended to replace advice given to you by your health care provider. Make sure you discuss any questions you have with your health care provider. Document Released: 01/03/2016 Document Revised: 08/26/2016 Document Reviewed: 10/08/2015 Elsevier Interactive Patient Education  2017 [place] Prevention in the Home Falls can cause injuries. They can happen to people of all ages. There are many things you can do to make your home safe and to help prevent falls. What can I do on the outside of my home?  Regularly fix the edges of walkways and driveways and fix any cracks.  Remove anything that might make you trip as you walk through a door, such as a raised step or threshold.  Trim any bushes or trees on the path to your home.  Use bright outdoor lighting.  Clear any walking paths of anything that might make someone trip, such as rocks or tools.  Regularly check to  see if handrails are loose or broken. Make sure that both sides of any steps have handrails.  Any raised decks and porches should have guardrails on the edges.  Have any leaves, snow, or ice cleared regularly.  Use sand or salt on walking paths during winter.  Clean up any spills in your garage right away. This includes oil or grease spills. What can I do in the bathroom?  Use night lights.  Install grab bars by the toilet and in the tub and shower. Do not use towel bars as grab bars.  Use non-skid mats or decals in the tub or shower.  If you need to sit down in the shower, use a plastic, non-slip stool.  Keep the floor dry. Clean up any water that spills on the floor as soon as it happens.  Remove soap buildup in the tub or shower regularly.  Attach bath mats securely with double-sided non-slip rug tape.  Do not have throw rugs and other things on the floor that can make you trip. What can I do in the bedroom?  Use night lights.  Make sure that you have a light by your bed that is easy to reach.  Do not use any sheets  or blankets that are too big for your bed. They should not hang down onto the floor.  Have a firm chair that has side arms. You can use this for support while you get dressed.  Do not have throw rugs and other things on the floor that can make you trip. What can I do in the kitchen?  Clean up any spills right away.  Avoid walking on wet floors.  Keep items that you use a lot in easy-to-reach places.  If you need to reach something above you, use a strong step stool that has a grab bar.  Keep electrical cords out of the way.  Do not use floor polish or wax that makes floors slippery. If you must use wax, use non-skid floor wax.  Do not have throw rugs and other things on the floor that can make you trip. What can I do with my stairs?  Do not leave any items on the stairs.  Make sure that there are handrails on both sides of the stairs and use  them. Fix handrails that are broken or loose. Make sure that handrails are as long as the stairways.  Check any carpeting to make sure that it is firmly attached to the stairs. Fix any carpet that is loose or worn.  Avoid having throw rugs at the top or bottom of the stairs. If you do have throw rugs, attach them to the floor with carpet tape.  Make sure that you have a light switch at the top of the stairs and the bottom of the stairs. If you do not have them, ask someone to add them for you. What else can I do to help prevent falls?  Wear shoes that:  Do not have high heels.  Have rubber bottoms.  Are comfortable and fit you well.  Are closed at the toe. Do not wear sandals.  If you use a stepladder:  Make sure that it is fully opened. Do not climb a closed stepladder.  Make sure that both sides of the stepladder are locked into place.  Ask someone to hold it for you, if possible.  Clearly mark and make sure that you can see:  Any grab bars or handrails.  First and last steps.  Where the edge of each step is.  Use tools that help you move around (mobility aids) if they are needed. These include:  Canes.  Walkers.  Scooters.  Crutches.  Turn on the lights when you go into a dark area. Replace any light bulbs as soon as they burn out.  Set up your furniture so you have a clear path. Avoid moving your furniture around.  If any of your floors are uneven, fix them.  If there are any pets around you, be aware of where they are.  Review your medicines with your doctor. Some medicines can make you feel dizzy. This can increase your chance of falling. Ask your doctor what other things that you can do to help prevent falls. This information is not intended to replace advice given to you by your health care provider. Make sure you discuss any questions you have with your health care provider. Document Released: 10/03/2009 Document Revised: 05/14/2016 Document Reviewed:  01/11/2015 Elsevier Interactive Patient Education  2017 [employer].

## 2020-01-24 DIAGNOSIS — N186 End stage renal disease: Secondary | ICD-10-CM | POA: Diagnosis not present

## 2020-01-24 DIAGNOSIS — D509 Iron deficiency anemia, unspecified: Secondary | ICD-10-CM | POA: Diagnosis not present

## 2020-01-24 DIAGNOSIS — D631 Anemia in chronic kidney disease: Secondary | ICD-10-CM | POA: Diagnosis not present

## 2020-01-24 DIAGNOSIS — N2581 Secondary hyperparathyroidism of renal origin: Secondary | ICD-10-CM | POA: Diagnosis not present

## 2020-01-24 DIAGNOSIS — Z992 Dependence on renal dialysis: Secondary | ICD-10-CM | POA: Diagnosis not present

## 2020-01-25 ENCOUNTER — Encounter: Payer: Self-pay | Admitting: Family Medicine

## 2020-01-25 ENCOUNTER — Other Ambulatory Visit: Payer: Self-pay

## 2020-01-25 ENCOUNTER — Ambulatory Visit (INDEPENDENT_AMBULATORY_CARE_PROVIDER_SITE_OTHER): Payer: Medicare Other | Admitting: Family Medicine

## 2020-01-25 VITALS — BP 166/70 | HR 75 | Temp 97.5°F | Resp 16 | Ht 67.0 in | Wt 227.6 lb

## 2020-01-25 DIAGNOSIS — M4722 Other spondylosis with radiculopathy, cervical region: Secondary | ICD-10-CM

## 2020-01-25 DIAGNOSIS — I739 Peripheral vascular disease, unspecified: Secondary | ICD-10-CM

## 2020-01-25 DIAGNOSIS — M0579 Rheumatoid arthritis with rheumatoid factor of multiple sites without organ or systems involvement: Secondary | ICD-10-CM | POA: Diagnosis not present

## 2020-01-25 DIAGNOSIS — R152 Fecal urgency: Secondary | ICD-10-CM | POA: Diagnosis not present

## 2020-01-25 DIAGNOSIS — I10 Essential (primary) hypertension: Secondary | ICD-10-CM

## 2020-01-25 DIAGNOSIS — E1122 Type 2 diabetes mellitus with diabetic chronic kidney disease: Secondary | ICD-10-CM

## 2020-01-25 DIAGNOSIS — N186 End stage renal disease: Secondary | ICD-10-CM | POA: Diagnosis not present

## 2020-01-25 DIAGNOSIS — R159 Full incontinence of feces: Secondary | ICD-10-CM | POA: Diagnosis not present

## 2020-01-25 DIAGNOSIS — E1140 Type 2 diabetes mellitus with diabetic neuropathy, unspecified: Secondary | ICD-10-CM | POA: Diagnosis not present

## 2020-01-25 DIAGNOSIS — L84 Corns and callosities: Secondary | ICD-10-CM | POA: Diagnosis not present

## 2020-01-25 NOTE — Progress Notes (Signed)
Subjective:    Patient ID: [name], female    DOB: 04/08/1966, 54 y.o.   MRN: [id number]  [name] is a 54 y.o. female presenting on 01/25/2020 for Diabetes (as per patient dialysis center had done A1c 8.8 may be 01/27--begining of 12/2019 she had coriozone shot ), Concussion (01/05/20), and paperwork   HPI   CHRONIC DM, Type 2 / ESRD on HD Diabetic Neuropathy, bilateral feet Left Foot Diabetic Ulcer- followed by Dr Ron Wilson Podiatry PAD Followed by Vascular specialist Today request new rx Diabetic shoes sent to [facility]. Last pair was 54 years old, she has chronic left foot callus and history of ulcer, known neuropathy. She manages it and sees podiatry, has some bruising under skin at that spot and thicker skin but she treats it at home currently without open ulceration. She is on chronic insulin therapy has ESRD on HD M-W-F Meds: Lantus 10 units daily, Novolog mealtime 2-3 units x 3 daily with meals (not take if missed meal) Reports good compliance. Tolerating well w/o side-effects does admit to some wt gain on insulin Lifestyle: - Diet (improving diabetic diet)   Eye, retinopathy, cataracts, L eye blind history of glaucoma as well. Denies hypoglycemia, polyuria, visual changes, numbness or tingling.  Headaches Head injury, Fall injury 01/05/20 Out of [facility] going to dialysis, contusion to left head - no bleeding or laceration, has had some episodic headaches now gradually improving. Taking Advil x3 PRN, takes oxycodone PRN only Allergic to Tylenol   Depression screen [facility] 2/9 01/23/2020 02/14/2019 12/07/2018  Decreased Interest 0 0 0  Down, Depressed, Hopeless 0 0 0  PHQ - 2 Score 0 0 0  Altered sleeping - - -  Tired, decreased energy - - -  Change in appetite - - -  Feeling bad or failure about yourself  - - -  Trouble concentrating - - -  Moving slowly or fidgety/restless - - -  Suicidal thoughts - - -  PHQ-9 Score - - -    Social History    Tobacco Use  . Smoking status: Light Tobacco Smoker    Packs/day: 0.25    Years: 20.00    Pack years: 5.00    Types: Cigarettes    Last attempt to quit: 05/21/2017    Years since quitting: 2.6  . Smokeless tobacco: Never Used  Substance Use Topics  . Alcohol use: No  . Drug use: No    Review of Systems Per HPI unless specifically indicated above     Objective:    BP (!) 166/70   Pulse 75   Temp (!) 97.5 F (36.4 C) (Oral)   Resp 16   Ht 5\' 7"  (3.662 m)   Wt 227 lb 9.6 oz (103.2 kg)   BMI 35.65 kg/m   Wt Readings from Last 3 Encounters:  01/25/20 227 lb 9.6 oz (103.2 kg)  01/23/20 220 lb (99.8 kg)  01/11/20 220 lb (99.8 kg)    Physical Exam Vitals and nursing note reviewed.  Constitutional:      General: She is not in acute distress.    Appearance: She is well-developed. She is not diaphoretic.     Comments: Well-appearing, comfortable, cooperative  HENT:     Head: Normocephalic and atraumatic.  Eyes:     General:        Right eye: No discharge.        Left eye: No discharge.     Conjunctiva/sclera: Conjunctivae normal.  Cardiovascular:  Rate and Rhythm: Normal rate.  Pulmonary:     Effort: Pulmonary effort is normal.  Musculoskeletal:     Comments: See DM foot exam  Has cane for ambulation  Skin:    General: Skin is warm and dry.     Findings: No erythema or rash.  Neurological:     Mental Status: She is alert and oriented to person, place, and time.  Psychiatric:        Behavior: Behavior normal.     Comments: Well groomed, good eye contact, normal speech and thoughts       Diabetic Foot Exam - Simple   Simple Foot Form Diabetic Foot exam was performed with the following findings: Yes 01/25/2020 10:24 AM  Visual Inspection See comments: Yes Sensation Testing See comments: Yes Pulse Check Posterior Tibialis and Dorsalis pulse intact bilaterally: Yes Comments Left forefoot with 2-3 cm approx area of under skin ecchymosis discoloration and  thicker callus, but has smooth and unbroken surface. Dry skin overall. Mostly absent monofilament sensation bilateral feet, except mild intact but reduced sensation to monofilament middle plantar arch of feet and dorsal aspect.      Recent Labs    05/29/19 1002 01/15/20 0000  HGBA1C 7.1* 8.8    Results for orders placed or performed in visit on 01/25/20  Hemoglobin A1c  Result Value Ref Range   Hemoglobin A1C 8.8       Assessment & Plan:   Problem List Items Addressed This Visit    Type 2 diabetes mellitus with ESRD (end-stage renal disease) ([place]) - Primary   Stool incontinence   Rheumatoid arthritis involving multiple sites with positive rheumatoid factor (HCC)   PAD (peripheral artery disease) (HCC)   Osteoarthritis of spine with radiculopathy, cervical region   Neuropathy due to type 2 diabetes mellitus ([place])   Essential hypertension    Other Visit Diagnoses    Callus of foot          Today she will need completed FL2 form, updated and signed to be faxed to [place].  Chronic joint pain arthritis, followed by rheum / pain management Uses cane and walker for ambulation for assistance Fecal urgency, has incontinence. She is continent of urine Followed by ophthalmology on regimen for glaucoma and retinopathy, limited sight  A1c 8.8, elevated and above goal Chronic bilateral diabetic neuropathy in both feet, as complication of diabetes Additionally with Left plantar callus with bruising associated no active open ulceration.  Completed DM Foot Exam today in office, 01/25/20. See exam note.  Plan - Proceed with ordering Diabetic Shoes, completed form today, will fax back to [place] along with this office note - Patient would benefit from Diabetic Shoes due to diabetic neuropathy with callus formation, history of ulceration, and peripheral arterial disease (PAD) reducing circulation. Diabetes control is being managed on current regimen, and I am  continuing to monitor and manage diabetes.  Also followed by [facility] Dr Cleda Mccreedy   No orders of the defined types were placed in this encounter.     Follow up plan: Return in about 3 months (around 04/23/2020), or if symptoms worsen or fail to improve, for DM A1c.   Nobie Putnam, [facility] 01/25/2020, 10:07 AM

## 2020-01-25 NOTE — Patient Instructions (Addendum)
Thank you for coming to the office today.  Will complete FL2 and send it to [place] once ready.  Will complete [facility]'s Form for Diabetic Shoes - stay tuned, check with them next week.  Headaches should continue to improve, as discussed.  Please schedule a Follow-up Appointment to: Return in about 3 months (around 04/23/2020), or if symptoms worsen or fail to improve, for DM A1c.  If you have any other questions or concerns, please feel free to call the office or send a message through [name]. You may also schedule an earlier appointment if necessary.  Additionally, you may be receiving a survey about your experience at our office within a few days to 1 week by e-mail or mail. We value your feedback.  Nobie Putnam, DO [place]

## 2020-01-26 DIAGNOSIS — N2581 Secondary hyperparathyroidism of renal origin: Secondary | ICD-10-CM | POA: Diagnosis not present

## 2020-01-26 DIAGNOSIS — D631 Anemia in chronic kidney disease: Secondary | ICD-10-CM | POA: Diagnosis not present

## 2020-01-26 DIAGNOSIS — N186 End stage renal disease: Secondary | ICD-10-CM | POA: Diagnosis not present

## 2020-01-26 DIAGNOSIS — D509 Iron deficiency anemia, unspecified: Secondary | ICD-10-CM | POA: Diagnosis not present

## 2020-01-26 DIAGNOSIS — Z992 Dependence on renal dialysis: Secondary | ICD-10-CM | POA: Diagnosis not present

## 2020-01-29 DIAGNOSIS — Z992 Dependence on renal dialysis: Secondary | ICD-10-CM | POA: Diagnosis not present

## 2020-01-29 DIAGNOSIS — N186 End stage renal disease: Secondary | ICD-10-CM | POA: Diagnosis not present

## 2020-01-29 DIAGNOSIS — D509 Iron deficiency anemia, unspecified: Secondary | ICD-10-CM | POA: Diagnosis not present

## 2020-01-29 DIAGNOSIS — N2581 Secondary hyperparathyroidism of renal origin: Secondary | ICD-10-CM | POA: Diagnosis not present

## 2020-01-29 DIAGNOSIS — D631 Anemia in chronic kidney disease: Secondary | ICD-10-CM | POA: Diagnosis not present

## 2020-01-29 DIAGNOSIS — U071 COVID-19: Secondary | ICD-10-CM | POA: Diagnosis not present

## 2020-01-30 ENCOUNTER — Telehealth: Payer: Self-pay | Admitting: Family Medicine

## 2020-01-30 NOTE — Telephone Encounter (Signed)
error 

## 2020-01-30 NOTE — Telephone Encounter (Signed)
    Called pt regarding Community Resource Referral for Hearing Aid & Smoking Cessation assistance. Patient is a 54 y.o. female  and is under the care of [place] [facility], [name], DO [place] . Embedded Care Coordination [place]  Care Management ??[email or web address]  ??[phone]

## 2020-01-31 DIAGNOSIS — D631 Anemia in chronic kidney disease: Secondary | ICD-10-CM | POA: Diagnosis not present

## 2020-01-31 DIAGNOSIS — D509 Iron deficiency anemia, unspecified: Secondary | ICD-10-CM | POA: Diagnosis not present

## 2020-01-31 DIAGNOSIS — Z992 Dependence on renal dialysis: Secondary | ICD-10-CM | POA: Diagnosis not present

## 2020-01-31 DIAGNOSIS — N2581 Secondary hyperparathyroidism of renal origin: Secondary | ICD-10-CM | POA: Diagnosis not present

## 2020-01-31 DIAGNOSIS — N186 End stage renal disease: Secondary | ICD-10-CM | POA: Diagnosis not present

## 2020-02-02 DIAGNOSIS — D631 Anemia in chronic kidney disease: Secondary | ICD-10-CM | POA: Diagnosis not present

## 2020-02-02 DIAGNOSIS — Z992 Dependence on renal dialysis: Secondary | ICD-10-CM | POA: Diagnosis not present

## 2020-02-02 DIAGNOSIS — D509 Iron deficiency anemia, unspecified: Secondary | ICD-10-CM | POA: Diagnosis not present

## 2020-02-02 DIAGNOSIS — N186 End stage renal disease: Secondary | ICD-10-CM | POA: Diagnosis not present

## 2020-02-02 DIAGNOSIS — N2581 Secondary hyperparathyroidism of renal origin: Secondary | ICD-10-CM | POA: Diagnosis not present

## 2020-02-03 ENCOUNTER — Other Ambulatory Visit: Payer: Self-pay | Admitting: Nurse Practitioner

## 2020-02-03 DIAGNOSIS — L22 Diaper dermatitis: Secondary | ICD-10-CM

## 2020-02-03 DIAGNOSIS — B372 Candidiasis of skin and nail: Secondary | ICD-10-CM

## 2020-02-05 DIAGNOSIS — Z992 Dependence on renal dialysis: Secondary | ICD-10-CM | POA: Diagnosis not present

## 2020-02-05 DIAGNOSIS — N2581 Secondary hyperparathyroidism of renal origin: Secondary | ICD-10-CM | POA: Diagnosis not present

## 2020-02-05 DIAGNOSIS — D509 Iron deficiency anemia, unspecified: Secondary | ICD-10-CM | POA: Diagnosis not present

## 2020-02-05 DIAGNOSIS — N186 End stage renal disease: Secondary | ICD-10-CM | POA: Diagnosis not present

## 2020-02-05 DIAGNOSIS — D631 Anemia in chronic kidney disease: Secondary | ICD-10-CM | POA: Diagnosis not present

## 2020-02-06 DIAGNOSIS — H4312 Vitreous hemorrhage, left eye: Secondary | ICD-10-CM | POA: Diagnosis not present

## 2020-02-06 DIAGNOSIS — H4052X0 Glaucoma secondary to other eye disorders, left eye, stage unspecified: Secondary | ICD-10-CM | POA: Diagnosis not present

## 2020-02-06 NOTE — Progress Notes (Deleted)
Cardiology Office Note    Date:  02/06/2020   ID:  Olivia, Wilson [birth year], MRN [id number]  PCP:  Olin Hauser, DO  Cardiologist:  Kathlyn Sacramento, MD  Electrophysiologist:  None   Chief Complaint: ***  History of Present Illness:   [name] is a 54 y.o. female with history of ESRD on HD, COPD secondary to tobacco use, IDDM with diabetic neuropathy of the bilateral feet and left foot diabetic ulcer followed by podiatry, HTN, orthostasis, migraine disorder, hepatitis C, HLD, PVD, iron deficiency anemia, PAD followed by vascular surgery, left eye blindness with glaucoma, anxiety, and depression who presents for follow-up of ***.  She was previously followed by Dr. Ubaldo Glassing in 2017 for atypical chest pain and underwent stress testing and echo testing at that time which were both unrevealing with echo demonstrating a low normal EF of 50 to 55%.  She was initially evaluated by Dr. Fletcher Anon on 05/31/2018 for dizziness and presyncope.  Initial Zio monitor showed the patient only wore the monitor for 1 day and 13 hours and did not show any significant arrhythmia.  Given she reported ongoing symptoms it was replaced x 4 days and showed NSR with an average heart rate of 74 bpm with rare PACs and PVCs with no significant arrhythmia. Echo on 06/09/2018 showed an EF of 55-60%, no RWMA, Gr1DD, mild AI, mild to moderate MR, moderately dilated LA, mild to moderate TR, PASP 36 mmHg.  Following this, long-acting diltiazem was changed to short acting and 06/2018.  And follow-up in 09/2018 she was doing better with less frequent episodes of dizziness and drops in her blood pressure.  That said, she did note her BP to be elevated on the mornings of her hemodialysis sessions when she was not taking diltiazem.  Following this, there was a mixup in her diltiazem prescription.  At her last visit in 01/2019 she was advised to take diltiazem 60 mg 3 times daily with the understanding she would take 30 mg on the  mornings of dialysis with 60 mg in the afternoon and evening doses on those days, if BP allowed.  It was recommended she try to avoid as needed clonidine in an effort to avoid large fluctuations in her BP.  She has been lost to follow-up since.  She was admitted to the hospital in 04/2019 with fever of unknown etiology and treated with empiric Augmentin.  She has been evaluated by vascular surgery for pain and the third, fourth, and fifth digits of her left hand with angiography demonstrating no indication for intervention.  With regards to her PAD, which is followed by vascular surgery, conservative therapy has been recommended at this time.  ***   Labs independently reviewed: 12/2019 - A1c 8.8 09/2019 - potassium 4.7 06/2019 - albumin 4.3, AST/ALT normal 04/2019 - Hgb 8.7 PLT 224  Past Medical History:  Diagnosis Date  . Allergy   . Anemia   . Anxiety    worse when not at home - bowel incontinence  . Arthritis   . Ataxia   . COPD (chronic obstructive pulmonary disease) ([place])   . Depression   . Diabetes mellitus with complication ([place])   . Dialysis patient ([place])   . ESRD on hemodialysis ([place])    MWF dialysis  . Fistula    left upper arm  . Hepatitis 2003   Hep C  . Hiatal hernia   . Hypercholesterolemia   . Hypertension   . Migraine   .  Neuropathy, diabetic ([facility])    lower legs  . Non-alcoholic cirrhosis ([place])   . Peripheral vascular disease ([place])   . Pneumonia 2015  . Psoriasis   . Renal insufficiency   . Tobacco dependence   . Wears dentures    full upper, partial lower    Past Surgical History:  Procedure Laterality Date  . A/V SHUNT INTERVENTION N/A 12/16/2017   Procedure: A/V SHUNT INTERVENTION;  Surgeon: Algernon Huxley, MD;  Location: [facility];  Service: Cardiovascular;  Laterality: N/A;  . AQUEOUS SHUNT Left 12/20/2019   Procedure: AHMED TUBE SHUNT WITH TUTOPLAST AND AC WASHOUT LEFT DIABETIC;  Surgeon: Leandrew Koyanagi, MD;  Location: [facility];  Service: Ophthalmology;  Laterality: Left;  Diabetic - insulin  . AV FISTULA PLACEMENT Left 11/2014  . CESAREAN SECTION    . CHOLECYSTECTOMY    . COLONOSCOPY N/A 09/22/2017   Procedure: COLONOSCOPY;  Surgeon: Lin Landsman, MD;  Location: [facility];  Service: Endoscopy;  Laterality: N/A;  . cyst removed  from left hand Left 1989  . DIALYSIS/PERMA CATHETER INSERTION N/A 05/20/2017   Procedure: Dialysis/Perma Catheter Insertion and fistulagram/LUE angiogram;  Surgeon: Algernon Huxley, MD;  Location: [facility];  Service: Cardiovascular;  Laterality: N/A;  . ESOPHAGOGASTRODUODENOSCOPY N/A 09/22/2017   Procedure: ESOPHAGOGASTRODUODENOSCOPY (EGD);  Surgeon: Lin Landsman, MD;  Location: [facility];  Service: Endoscopy;  Laterality: N/A;  . INCISION AND DRAINAGE ABSCESS N/A 07/16/2016   Procedure: INCISION AND DRAINAGE ABSCESS;  Surgeon: Carloyn Manner, MD;  Location: [facility];  Service: ENT;  Laterality: N/A;  . PERIPHERAL VASCULAR CATHETERIZATION N/A 07/25/2015   Procedure: A/V Shuntogram/Fistulagram;  Surgeon: Algernon Huxley, MD;  Location: [facility];  Service: Cardiovascular;  Laterality: N/A;  . PERIPHERAL VASCULAR CATHETERIZATION Left 07/25/2015   Procedure: A/V Shunt Intervention;  Surgeon: Algernon Huxley, MD;  Location: [facility];  Service: Cardiovascular;  Laterality: Left;  . PERIPHERAL VASCULAR CATHETERIZATION Left 10/07/2015   Procedure: A/V Shuntogram/Fistulagram;  Surgeon: Algernon Huxley, MD;  Location: [facility];  Service: Cardiovascular;  Laterality: Left;  . PERIPHERAL VASCULAR CATHETERIZATION N/A 10/07/2015   Procedure: A/V Shunt Intervention;  Surgeon: Algernon Huxley, MD;  Location: [facility];  Service: Cardiovascular;  Laterality: N/A;  . PERIPHERAL VASCULAR CATHETERIZATION  10/07/2015   Procedure: Dialysis/Perma Catheter Insertion;  Surgeon: Algernon Huxley, MD;  Location: [facility];  Service:  Cardiovascular;;  . PERIPHERAL VASCULAR CATHETERIZATION N/A 12/17/2015   Procedure: Dialysis/Perma Catheter Removal;  Surgeon: Katha Cabal, MD;  Location: [facility];  Service: Cardiovascular;  Laterality: N/A;  . PERIPHERAL VASCULAR CATHETERIZATION N/A 01/11/2017   Procedure: Visceral Angiography;  Surgeon: Algernon Huxley, MD;  Location: [facility];  Service: Cardiovascular;  Laterality: N/A;  . PERIPHERAL VASCULAR CATHETERIZATION N/A 01/11/2017   Procedure: Visceral Artery Intervention;  Surgeon: Algernon Huxley, MD;  Location: [facility];  Service: Cardiovascular;  Laterality: N/A;  . POLYPECTOMY  09/22/2017   Procedure: POLYPECTOMY;  Surgeon: Lin Landsman, MD;  Location: [facility];  Service: Endoscopy;;  . rt. tubal and ovary removed    . STENT PLACEMENT VASCULAR ([place] [facility]) Left 03/2018   Performed at [facility] using EV3 protg GPS stent graph [id number] lot [id number]  . TONSILLECTOMY    . TUBAL LIGATION    . UPPER EXTREMITY ANGIOGRAPHY Left 09/29/2019   Procedure: UPPER EXTREMITY ANGIOGRAPHY;  Surgeon: Katha Cabal, MD;  Location: [facility];  Service: Cardiovascular;  Laterality: Left;    Current Medications: No outpatient medications have been marked as taking for the 02/13/20 encounter (Appointment) with Rise Mu, PA-C.    Allergies:   Cinnamon, Garlic, Onion, Tylenol [acetaminophen], Ciprofloxacin, and Prednisone   Social History   Socioeconomic History  . Marital status: Legally Separated    Spouse name: Not on file  . Number of children: Not on file  . Years of education: Not on file  . Highest education level: Not on file  Occupational History  . Occupation: disabled  Tobacco Use  . Smoking status: Light Tobacco Smoker    Packs/day: 0.25    Years: 20.00    Pack years: 5.00    Types: Cigarettes    Last attempt to quit: 05/21/2017    Years since quitting: 2.7  . Smokeless tobacco:  Never Used  Substance and Sexual Activity  . Alcohol use: No  . Drug use: No  . Sexual activity: Not on file  Other Topics Concern  . Not on file  Social History Narrative   Patient lives at an extended stay hotel currently.     Social Determinants of Health   Financial Resource Strain:   . Difficulty of Paying Living Expenses: Not on file  Food Insecurity:   . Worried About Charity fundraiser in the Last Year: Not on file  . Ran Out of Food in the Last Year: Not on file  Transportation Needs:   . Lack of Transportation (Medical): Not on file  . Lack of Transportation (Non-Medical): Not on file  Physical Activity:   . Days of Exercise per Week: Not on file  . Minutes of Exercise per Session: Not on file  Stress:   . Feeling of Stress : Not on file  Social Connections:   . Frequency of Communication with Friends and Family: Not on file  . Frequency of Social Gatherings with Friends and Family: Not on file  . Attends Religious Services: Not on file  . Active Member of Clubs or Organizations: Not on file  . Attends Archivist Meetings: Not on file  . Marital Status: Not on file     Family History:  The patient's family history includes Heart disease in her mother; Hypertension in her father and mother; Skin cancer in her father.  ROS:   ROS   EKGs/Labs/Other Studies Reviewed:    Studies reviewed were summarized above. The additional studies were reviewed today:  Zio patch 06/2018: 4-day outpatient monitor.  Normal sinus rhythm with average heart rate of 74 bpm. Rare PACs and PVCs.  Overall, no significant arrhythmia __________  2D echo 05/2018: - Left ventricle: The cavity size was normal. There was mild  concentric hypertrophy. Systolic function was normal. The  estimated ejection fraction was in the range of 55% to 60%. Wall  motion was normal; there were no regional wall motion  abnormalities. Doppler parameters are consistent with  abnormal  left ventricular relaxation (grade 1 diastolic dysfunction).  - Aortic valve: There was mild regurgitation.  - Mitral valve: There was mild to moderate regurgitation.  - Left atrium: The atrium was moderately dilated.  - Tricuspid valve: There was mild-moderate regurgitation.  - Pulmonary arteries: Systolic pressure was mildly elevated. PA  peak pressure: 36 mm Hg (S).  __________  Zio patch 05/2018: Normal sinus rhythm with no significant arrhythmia. Monitoring time is only 1 day and 13  hours.   EKG:  EKG is ordered today.  The EKG ordered today demonstrates ***  Recent Labs: 05/01/2019: BUN 52; Creatinine, Ser 7.50; Hemoglobin 9.3; Platelets 78; Potassium 5.2; Sodium 128 07/11/2019: ALT 24  Recent Lipid Panel    Component Value Date/Time   CHOL 157 10/15/2014 0402   TRIG 83 10/15/2014 0402   HDL 49 10/15/2014 0402   VLDL 17 10/15/2014 0402   LDLCALC 91 10/15/2014 0402    PHYSICAL EXAM:    VS:  There were no vitals taken for this visit.  BMI: There is no height or weight on file to calculate BMI.  Physical Exam  Wt Readings from Last 3 Encounters:  01/25/20 227 lb 9.6 oz (103.2 kg)  01/23/20 220 lb (99.8 kg)  01/11/20 220 lb (99.8 kg)     ASSESSMENT & PLAN:   1. ***  Disposition: F/u with Dr. Fletcher Anon or an APP in ***.   Medication Adjustments/Labs and Tests Ordered: Current medicines are reviewed at length with the patient today.  Concerns regarding medicines are outlined above. Medication changes, Labs and Tests ordered today are summarized above and listed in the Patient Instructions accessible in Encounters.   Signed, Christell Faith, PA-C 02/06/2020 11:36 AM     [address] [place] [address] [place], [place] [address] [phone]

## 2020-02-07 DIAGNOSIS — D631 Anemia in chronic kidney disease: Secondary | ICD-10-CM | POA: Diagnosis not present

## 2020-02-07 DIAGNOSIS — N186 End stage renal disease: Secondary | ICD-10-CM | POA: Diagnosis not present

## 2020-02-07 DIAGNOSIS — Z992 Dependence on renal dialysis: Secondary | ICD-10-CM | POA: Diagnosis not present

## 2020-02-07 DIAGNOSIS — D509 Iron deficiency anemia, unspecified: Secondary | ICD-10-CM | POA: Diagnosis not present

## 2020-02-07 DIAGNOSIS — N2581 Secondary hyperparathyroidism of renal origin: Secondary | ICD-10-CM | POA: Diagnosis not present

## 2020-02-09 DIAGNOSIS — N186 End stage renal disease: Secondary | ICD-10-CM | POA: Diagnosis not present

## 2020-02-09 DIAGNOSIS — E1140 Type 2 diabetes mellitus with diabetic neuropathy, unspecified: Secondary | ICD-10-CM | POA: Diagnosis not present

## 2020-02-09 DIAGNOSIS — L851 Acquired keratosis [keratoderma] palmaris et plantaris: Secondary | ICD-10-CM | POA: Diagnosis not present

## 2020-02-09 DIAGNOSIS — Z992 Dependence on renal dialysis: Secondary | ICD-10-CM | POA: Diagnosis not present

## 2020-02-09 DIAGNOSIS — D631 Anemia in chronic kidney disease: Secondary | ICD-10-CM | POA: Diagnosis not present

## 2020-02-09 DIAGNOSIS — N2581 Secondary hyperparathyroidism of renal origin: Secondary | ICD-10-CM | POA: Diagnosis not present

## 2020-02-09 DIAGNOSIS — D509 Iron deficiency anemia, unspecified: Secondary | ICD-10-CM | POA: Diagnosis not present

## 2020-02-09 DIAGNOSIS — B351 Tinea unguium: Secondary | ICD-10-CM | POA: Diagnosis not present

## 2020-02-09 DIAGNOSIS — Z794 Long term (current) use of insulin: Secondary | ICD-10-CM | POA: Diagnosis not present

## 2020-02-09 NOTE — Telephone Encounter (Signed)
2/19 pt was at dialysis appt asked that I call her back on Tuesday 2/23 at 9:30am. Reminder set knb

## 2020-02-12 DIAGNOSIS — N2581 Secondary hyperparathyroidism of renal origin: Secondary | ICD-10-CM | POA: Diagnosis not present

## 2020-02-12 DIAGNOSIS — N186 End stage renal disease: Secondary | ICD-10-CM | POA: Diagnosis not present

## 2020-02-12 DIAGNOSIS — Z992 Dependence on renal dialysis: Secondary | ICD-10-CM | POA: Diagnosis not present

## 2020-02-12 DIAGNOSIS — D509 Iron deficiency anemia, unspecified: Secondary | ICD-10-CM | POA: Diagnosis not present

## 2020-02-12 DIAGNOSIS — D631 Anemia in chronic kidney disease: Secondary | ICD-10-CM | POA: Diagnosis not present

## 2020-02-13 ENCOUNTER — Ambulatory Visit: Payer: Medicare Other | Admitting: Physician Assistant

## 2020-02-13 NOTE — Telephone Encounter (Signed)
2nd attempt lmtcb knb

## 2020-02-14 DIAGNOSIS — N186 End stage renal disease: Secondary | ICD-10-CM | POA: Diagnosis not present

## 2020-02-14 DIAGNOSIS — B182 Chronic viral hepatitis C: Secondary | ICD-10-CM | POA: Diagnosis not present

## 2020-02-14 DIAGNOSIS — D509 Iron deficiency anemia, unspecified: Secondary | ICD-10-CM | POA: Diagnosis not present

## 2020-02-14 DIAGNOSIS — N2581 Secondary hyperparathyroidism of renal origin: Secondary | ICD-10-CM | POA: Diagnosis not present

## 2020-02-14 DIAGNOSIS — Z992 Dependence on renal dialysis: Secondary | ICD-10-CM | POA: Diagnosis not present

## 2020-02-14 DIAGNOSIS — D631 Anemia in chronic kidney disease: Secondary | ICD-10-CM | POA: Diagnosis not present

## 2020-02-16 DIAGNOSIS — N186 End stage renal disease: Secondary | ICD-10-CM | POA: Diagnosis not present

## 2020-02-16 DIAGNOSIS — N2581 Secondary hyperparathyroidism of renal origin: Secondary | ICD-10-CM | POA: Diagnosis not present

## 2020-02-16 DIAGNOSIS — D631 Anemia in chronic kidney disease: Secondary | ICD-10-CM | POA: Diagnosis not present

## 2020-02-16 DIAGNOSIS — Z992 Dependence on renal dialysis: Secondary | ICD-10-CM | POA: Diagnosis not present

## 2020-02-16 DIAGNOSIS — D509 Iron deficiency anemia, unspecified: Secondary | ICD-10-CM | POA: Diagnosis not present

## 2020-02-16 NOTE — Telephone Encounter (Signed)
   02/16/2020  Name: [name]   MRN: [id number]   DOB: [birth year]   AGE: 54 y.o.   GENDER: female   PCP Olin Hauser, DO.   Called pt regarding Community Resource Referral, pt was traveling to dialysis she has m,w,fr asked that I call her back on Tuesday March 2nd  [place] . [email or web address]  [phone]

## 2020-02-18 ENCOUNTER — Other Ambulatory Visit: Payer: Self-pay | Admitting: Family Medicine

## 2020-02-18 DIAGNOSIS — J00 Acute nasopharyngitis [common cold]: Secondary | ICD-10-CM

## 2020-02-18 DIAGNOSIS — Z992 Dependence on renal dialysis: Secondary | ICD-10-CM | POA: Diagnosis not present

## 2020-02-18 DIAGNOSIS — N186 End stage renal disease: Secondary | ICD-10-CM | POA: Diagnosis not present

## 2020-02-19 DIAGNOSIS — N186 End stage renal disease: Secondary | ICD-10-CM | POA: Diagnosis not present

## 2020-02-19 DIAGNOSIS — D631 Anemia in chronic kidney disease: Secondary | ICD-10-CM | POA: Diagnosis not present

## 2020-02-19 DIAGNOSIS — D509 Iron deficiency anemia, unspecified: Secondary | ICD-10-CM | POA: Diagnosis not present

## 2020-02-19 DIAGNOSIS — N2581 Secondary hyperparathyroidism of renal origin: Secondary | ICD-10-CM | POA: Diagnosis not present

## 2020-02-19 DIAGNOSIS — Z992 Dependence on renal dialysis: Secondary | ICD-10-CM | POA: Diagnosis not present

## 2020-02-20 NOTE — Telephone Encounter (Signed)
   02/20/2020  Name: [name]   MRN: [id number]   DOB: [birth year]   AGE: 54 y.o.   GENDER: female   PCP Olin Hauser, DO.   Called pt regarding Community Resource Referral LMTCB  Notes: Hearing aid assistance. Also provided her with smoking cessation information. Can you follow up with her. She was trying to get free resources on nicotine patches and gum and called one line before and was told they didn't provide that in this area. I gave her [facility] smoking cessation program number.  [place] . [email or web address]  [phone]

## 2020-02-21 DIAGNOSIS — Z992 Dependence on renal dialysis: Secondary | ICD-10-CM | POA: Diagnosis not present

## 2020-02-21 DIAGNOSIS — N2581 Secondary hyperparathyroidism of renal origin: Secondary | ICD-10-CM | POA: Diagnosis not present

## 2020-02-21 DIAGNOSIS — D631 Anemia in chronic kidney disease: Secondary | ICD-10-CM | POA: Diagnosis not present

## 2020-02-21 DIAGNOSIS — N186 End stage renal disease: Secondary | ICD-10-CM | POA: Diagnosis not present

## 2020-02-21 DIAGNOSIS — D509 Iron deficiency anemia, unspecified: Secondary | ICD-10-CM | POA: Diagnosis not present

## 2020-02-22 DIAGNOSIS — M79675 Pain in left toe(s): Secondary | ICD-10-CM | POA: Diagnosis not present

## 2020-02-22 DIAGNOSIS — M65342 Trigger finger, left ring finger: Secondary | ICD-10-CM | POA: Diagnosis not present

## 2020-02-22 DIAGNOSIS — M0579 Rheumatoid arthritis with rheumatoid factor of multiple sites without organ or systems involvement: Secondary | ICD-10-CM | POA: Diagnosis not present

## 2020-02-22 DIAGNOSIS — G8929 Other chronic pain: Secondary | ICD-10-CM | POA: Diagnosis not present

## 2020-02-22 DIAGNOSIS — N186 End stage renal disease: Secondary | ICD-10-CM | POA: Diagnosis not present

## 2020-02-22 DIAGNOSIS — L409 Psoriasis, unspecified: Secondary | ICD-10-CM | POA: Diagnosis not present

## 2020-02-22 DIAGNOSIS — L4050 Arthropathic psoriasis, unspecified: Secondary | ICD-10-CM | POA: Diagnosis not present

## 2020-02-22 DIAGNOSIS — Z992 Dependence on renal dialysis: Secondary | ICD-10-CM | POA: Diagnosis not present

## 2020-02-23 DIAGNOSIS — D509 Iron deficiency anemia, unspecified: Secondary | ICD-10-CM | POA: Diagnosis not present

## 2020-02-23 DIAGNOSIS — N186 End stage renal disease: Secondary | ICD-10-CM | POA: Diagnosis not present

## 2020-02-23 DIAGNOSIS — Z992 Dependence on renal dialysis: Secondary | ICD-10-CM | POA: Diagnosis not present

## 2020-02-23 DIAGNOSIS — N2581 Secondary hyperparathyroidism of renal origin: Secondary | ICD-10-CM | POA: Diagnosis not present

## 2020-02-23 DIAGNOSIS — D631 Anemia in chronic kidney disease: Secondary | ICD-10-CM | POA: Diagnosis not present

## 2020-02-26 DIAGNOSIS — N2581 Secondary hyperparathyroidism of renal origin: Secondary | ICD-10-CM | POA: Diagnosis not present

## 2020-02-26 DIAGNOSIS — D509 Iron deficiency anemia, unspecified: Secondary | ICD-10-CM | POA: Diagnosis not present

## 2020-02-26 DIAGNOSIS — N186 End stage renal disease: Secondary | ICD-10-CM | POA: Diagnosis not present

## 2020-02-26 DIAGNOSIS — Z992 Dependence on renal dialysis: Secondary | ICD-10-CM | POA: Diagnosis not present

## 2020-02-26 DIAGNOSIS — D631 Anemia in chronic kidney disease: Secondary | ICD-10-CM | POA: Diagnosis not present

## 2020-02-28 DIAGNOSIS — Z992 Dependence on renal dialysis: Secondary | ICD-10-CM | POA: Diagnosis not present

## 2020-02-28 DIAGNOSIS — N2581 Secondary hyperparathyroidism of renal origin: Secondary | ICD-10-CM | POA: Diagnosis not present

## 2020-02-28 DIAGNOSIS — N186 End stage renal disease: Secondary | ICD-10-CM | POA: Diagnosis not present

## 2020-02-28 DIAGNOSIS — D631 Anemia in chronic kidney disease: Secondary | ICD-10-CM | POA: Diagnosis not present

## 2020-02-28 DIAGNOSIS — D509 Iron deficiency anemia, unspecified: Secondary | ICD-10-CM | POA: Diagnosis not present

## 2020-02-29 ENCOUNTER — Ambulatory Visit: Payer: Medicare Other | Admitting: Gastroenterology

## 2020-03-01 DIAGNOSIS — N186 End stage renal disease: Secondary | ICD-10-CM | POA: Diagnosis not present

## 2020-03-01 DIAGNOSIS — Z992 Dependence on renal dialysis: Secondary | ICD-10-CM | POA: Diagnosis not present

## 2020-03-01 DIAGNOSIS — D631 Anemia in chronic kidney disease: Secondary | ICD-10-CM | POA: Diagnosis not present

## 2020-03-01 DIAGNOSIS — D509 Iron deficiency anemia, unspecified: Secondary | ICD-10-CM | POA: Diagnosis not present

## 2020-03-01 DIAGNOSIS — N2581 Secondary hyperparathyroidism of renal origin: Secondary | ICD-10-CM | POA: Diagnosis not present

## 2020-03-04 DIAGNOSIS — Z992 Dependence on renal dialysis: Secondary | ICD-10-CM | POA: Diagnosis not present

## 2020-03-04 DIAGNOSIS — N2581 Secondary hyperparathyroidism of renal origin: Secondary | ICD-10-CM | POA: Diagnosis not present

## 2020-03-04 DIAGNOSIS — N186 End stage renal disease: Secondary | ICD-10-CM | POA: Diagnosis not present

## 2020-03-04 DIAGNOSIS — D509 Iron deficiency anemia, unspecified: Secondary | ICD-10-CM | POA: Diagnosis not present

## 2020-03-04 DIAGNOSIS — D631 Anemia in chronic kidney disease: Secondary | ICD-10-CM | POA: Diagnosis not present

## 2020-03-05 ENCOUNTER — Other Ambulatory Visit: Payer: Self-pay | Admitting: Student in an Organized Health Care Education/Training Program

## 2020-03-05 DIAGNOSIS — G8929 Other chronic pain: Secondary | ICD-10-CM | POA: Diagnosis not present

## 2020-03-05 DIAGNOSIS — M79675 Pain in left toe(s): Secondary | ICD-10-CM | POA: Diagnosis not present

## 2020-03-05 DIAGNOSIS — G894 Chronic pain syndrome: Secondary | ICD-10-CM

## 2020-03-05 DIAGNOSIS — M19072 Primary osteoarthritis, left ankle and foot: Secondary | ICD-10-CM | POA: Diagnosis not present

## 2020-03-06 DIAGNOSIS — N186 End stage renal disease: Secondary | ICD-10-CM | POA: Diagnosis not present

## 2020-03-06 DIAGNOSIS — Z992 Dependence on renal dialysis: Secondary | ICD-10-CM | POA: Diagnosis not present

## 2020-03-06 DIAGNOSIS — D509 Iron deficiency anemia, unspecified: Secondary | ICD-10-CM | POA: Diagnosis not present

## 2020-03-06 DIAGNOSIS — N2581 Secondary hyperparathyroidism of renal origin: Secondary | ICD-10-CM | POA: Diagnosis not present

## 2020-03-06 DIAGNOSIS — D631 Anemia in chronic kidney disease: Secondary | ICD-10-CM | POA: Diagnosis not present

## 2020-03-07 NOTE — Telephone Encounter (Signed)
   KNB 03/07/2020  Name: [name]   MRN: [id number]   DOB: 03/31/1966   AGE: 54 y.o.   GENDER: female   PCP Olin Hauser, DO.   Called pt regarding Community Resource Referral. Pt stated that she was just interested in the smoking cessation program gave her details about upcoming class at [facility]  [email or web address] Apr 12 - Apr 22, 2020 Monday & Wednesday 12:00 PM - 1:00 PM she stated that she will be in dialysis but could put her earphones on and listen to the webinar. She stated that she would call [phone] to get registered. Closing referral pending any other needs of patient.  [place] . [email or web address]  [phone]

## 2020-03-08 DIAGNOSIS — D509 Iron deficiency anemia, unspecified: Secondary | ICD-10-CM | POA: Diagnosis not present

## 2020-03-08 DIAGNOSIS — N186 End stage renal disease: Secondary | ICD-10-CM | POA: Diagnosis not present

## 2020-03-08 DIAGNOSIS — D631 Anemia in chronic kidney disease: Secondary | ICD-10-CM | POA: Diagnosis not present

## 2020-03-08 DIAGNOSIS — N2581 Secondary hyperparathyroidism of renal origin: Secondary | ICD-10-CM | POA: Diagnosis not present

## 2020-03-08 DIAGNOSIS — Z992 Dependence on renal dialysis: Secondary | ICD-10-CM | POA: Diagnosis not present

## 2020-03-11 ENCOUNTER — Other Ambulatory Visit: Payer: Self-pay | Admitting: Student in an Organized Health Care Education/Training Program

## 2020-03-11 DIAGNOSIS — D631 Anemia in chronic kidney disease: Secondary | ICD-10-CM | POA: Diagnosis not present

## 2020-03-11 DIAGNOSIS — R5383 Other fatigue: Secondary | ICD-10-CM | POA: Diagnosis not present

## 2020-03-11 DIAGNOSIS — D509 Iron deficiency anemia, unspecified: Secondary | ICD-10-CM | POA: Diagnosis not present

## 2020-03-11 DIAGNOSIS — N186 End stage renal disease: Secondary | ICD-10-CM | POA: Diagnosis not present

## 2020-03-11 DIAGNOSIS — G894 Chronic pain syndrome: Secondary | ICD-10-CM

## 2020-03-11 DIAGNOSIS — Z992 Dependence on renal dialysis: Secondary | ICD-10-CM | POA: Diagnosis not present

## 2020-03-11 DIAGNOSIS — N2581 Secondary hyperparathyroidism of renal origin: Secondary | ICD-10-CM | POA: Diagnosis not present

## 2020-03-12 ENCOUNTER — Other Ambulatory Visit: Payer: Self-pay | Admitting: Student in an Organized Health Care Education/Training Program

## 2020-03-12 ENCOUNTER — Telehealth: Payer: Self-pay | Admitting: *Deleted

## 2020-03-12 NOTE — Telephone Encounter (Signed)
Called for pre visit assessment. No answer. LVM. 

## 2020-03-13 ENCOUNTER — Encounter: Payer: Self-pay | Admitting: Student in an Organized Health Care Education/Training Program

## 2020-03-13 DIAGNOSIS — D509 Iron deficiency anemia, unspecified: Secondary | ICD-10-CM | POA: Diagnosis not present

## 2020-03-13 DIAGNOSIS — D631 Anemia in chronic kidney disease: Secondary | ICD-10-CM | POA: Diagnosis not present

## 2020-03-13 DIAGNOSIS — N2581 Secondary hyperparathyroidism of renal origin: Secondary | ICD-10-CM | POA: Diagnosis not present

## 2020-03-13 DIAGNOSIS — N186 End stage renal disease: Secondary | ICD-10-CM | POA: Diagnosis not present

## 2020-03-13 DIAGNOSIS — Z992 Dependence on renal dialysis: Secondary | ICD-10-CM | POA: Diagnosis not present

## 2020-03-14 ENCOUNTER — Encounter: Payer: Self-pay | Admitting: Student in an Organized Health Care Education/Training Program

## 2020-03-14 ENCOUNTER — Other Ambulatory Visit: Payer: Self-pay

## 2020-03-14 ENCOUNTER — Ambulatory Visit
Payer: Medicare Other | Attending: Student in an Organized Health Care Education/Training Program | Admitting: Student in an Organized Health Care Education/Training Program

## 2020-03-14 DIAGNOSIS — M47812 Spondylosis without myelopathy or radiculopathy, cervical region: Secondary | ICD-10-CM | POA: Diagnosis not present

## 2020-03-14 DIAGNOSIS — E1142 Type 2 diabetes mellitus with diabetic polyneuropathy: Secondary | ICD-10-CM | POA: Diagnosis not present

## 2020-03-14 DIAGNOSIS — M1711 Unilateral primary osteoarthritis, right knee: Secondary | ICD-10-CM

## 2020-03-14 DIAGNOSIS — M5030 Other cervical disc degeneration, unspecified cervical region: Secondary | ICD-10-CM

## 2020-03-14 DIAGNOSIS — G894 Chronic pain syndrome: Secondary | ICD-10-CM | POA: Diagnosis not present

## 2020-03-14 DIAGNOSIS — M5412 Radiculopathy, cervical region: Secondary | ICD-10-CM

## 2020-03-14 DIAGNOSIS — S92515A Nondisplaced fracture of proximal phalanx of left lesser toe(s), initial encounter for closed fracture: Secondary | ICD-10-CM | POA: Diagnosis not present

## 2020-03-14 MED ORDER — OXYCODONE HCL 5 MG PO TABS: 5.0000 mg | ORAL_TABLET | Freq: Two times a day (BID) | ORAL | 0 refills | Status: DC | PRN

## 2020-03-14 NOTE — Progress Notes (Signed)
Patient: [name]  Service Category: E/M  Provider: Gillis Santa, MD  DOB: 1966-10-28  DOS: 03/14/2020  Location: Office  MRN: [id number]  Setting: Ambulatory outpatient  Referring Provider: No ref. provider found  Type: Established Patient  Specialty: Interventional Pain Management  PCP: Olin Hauser, DO  Location: Home  Delivery: TeleHealth     Virtual Encounter - Pain Management PROVIDER NOTE: Information contained herein reflects review and annotations entered in association with encounter. Interpretation of such information and data should be left to medically-trained personnel. Information provided to patient can be located elsewhere in the medical record under "Patient Instructions". Document created using STT-dictation technology, any transcriptional errors that may result from process are unintentional.    Contact & Pharmacy Preferred: [phone] Home: [phone] (home) Mobile: [phone] (mobile) E-mail: [email or web address]  [place] [address] [place] [address] Phone: [phone] Fax: [phone]   Pre-screening  Ms. Witt offered "in-person" vs "virtual" encounter. She indicated preferring virtual for this encounter.   Reason COVID-19*  Social distancing based on CDC and AMA recommendations.   I contacted [name] on 03/14/2020 via telephone.      I clearly identified myself as Gillis Santa, MD. I verified that I was speaking with the correct person using two identifiers (Name: KLANI CARIDI, and date of birth: Feb 24, 1966).  This visit was completed via telephone due to the restrictions of the COVID-19 pandemic. All issues as above were discussed and addressed but no physical exam was performed. If it was felt that the patient should be evaluated in the office, they were directed there. The patient verbally consented to this visit. Patient was unable to complete an audio/visual visit due to Technical  difficulties and/or Lack of internet. Due to the catastrophic nature of the COVID-19 pandemic, this visit was done through audio contact only.  Location of the patient: home address (see Epic for details)  Location of the provider: office  Consent I sought verbal advanced consent from [name] for virtual visit interactions. I informed Ms. Waln of possible security and privacy concerns, risks, and limitations associated with providing "not-in-person" medical evaluation and management services. I also informed Ms. Inlow of the availability of "in-person" appointments. Finally, I informed her that there would be a charge for the virtual visit and that she could be  personally, fully or partially, financially responsible for it. Ms. Templeman expressed understanding and agreed to proceed.   Historic Elements   [name] is a 54 y.o. year old, female patient evaluated today after her last contact with our practice on 03/12/2020. Ms. Sliter  has a past medical history of Allergy, Anemia, Anxiety, Arthritis, Ataxia, COPD (chronic obstructive pulmonary disease) ([place]), Depression, Diabetes mellitus with complication ([place]), Dialysis patient ([place]), ESRD on hemodialysis ([place]), Fistula, Hepatitis (2003), Hiatal hernia, Hypercholesterolemia, Hypertension, Migraine, Neuropathy, diabetic ([place]), Non-alcoholic cirrhosis ([place]), Peripheral vascular disease ([place]), Pneumonia (2015), Psoriasis, Renal insufficiency, Tobacco dependence, and Wears dentures. She also  has a past surgical history that includes Tonsillectomy; rt. tubal and ovary removed; Cholecystectomy; Cardiac catheterization (N/A, 07/25/2015); Cardiac catheterization (Left, 07/25/2015); Cardiac catheterization (Left, 10/07/2015); Cardiac catheterization (N/A, 10/07/2015); Cardiac catheterization (10/07/2015); Cardiac catheterization (N/A, 12/17/2015); Incision and drainage abscess (N/A, 07/16/2016); cyst removed  from left hand (Left, 1989); AV fistula  placement (Left, 11/2014); Cardiac catheterization (N/A, 01/11/2017); Cardiac catheterization (N/A, 01/11/2017); DIALYSIS/PERMA CATHETER INSERTION (N/A, 05/20/2017); Cesarean section; Tubal ligation; Colonoscopy (N/A, 09/22/2017); Esophagogastroduodenoscopy (N/A,  09/22/2017); polypectomy (09/22/2017); A/V SHUNT INTERVENTION (N/A, 12/16/2017); STENT PLACEMENT VASCULAR ([facility] HX) (Left, 03/2018); Upper Extremity Angiography (Left, 09/29/2019); and Aqueous shunt (Left, 12/20/2019). Ms. Milholland has a current medication list which includes the following prescription(s): humira, amitriptyline, atorvastatin, brimonidine, diclofenac sodium, diltiazem, dorzolamidel-timolol, ferric citrate, glucose blood, insulin aspart, ketoconazole, lantus, levocetirizine, omeprazole, [START ON 03/22/2020] oxycodone, [START ON 04/21/2020] oxycodone, [START ON 05/21/2020] oxycodone, prednisolone acetate, torsemide, trueplus insulin syringe, ventolin hfa, atropine, calcium acetate, cyclobenzaprine, ondansetron, and symbicort. She  reports that she has been smoking cigarettes. She has a 5.00 pack-year smoking history. She has never used smokeless tobacco. She reports that she does not drink alcohol or use drugs. Ms. Clapp is allergic to cinnamon; garlic; onion; tylenol [acetaminophen]; ciprofloxacin; and prednisone.   HPI  Today, she is being contacted for medication management.   Dec 30- stent placed in left eye for glaucoma and diabetic retinopathy. Otherwise,  Patient's pain is at baseline.  Patient continues multimodal pain regimen as prescribed.  States that it provides pain relief and improvement in functional status.  Pharmacotherapy Assessment  Analgesic: 02/21/2020  2   12/12/2019  Oxycodone Hcl 5 MG Tablet  60.00  30 Bi Lat   [id number]   [place] ([id number])   0  15.00 MME  Medicare   [place]    Monitoring: [place] PMP: PDMP reviewed during this encounter.       Pharmacotherapy: No side-effects or adverse reactions reported. Compliance: No problems  identified. Effectiveness: Clinically acceptable. Plan: Refer to "POC". Laboratory Chemistry Profile   Renal Lab Results  Component Value Date   BUN 52 (H) 05/01/2019   CREATININE 7.50 (H) 05/01/2019   BCR 10 12/27/2017   GFRAA 6 (L) 05/01/2019   GFRNONAA 6 (L) 05/01/2019    Hepatic Lab Results  Component Value Date   AST 28 07/11/2019   ALT 24 07/11/2019   ALBUMIN 4.3 07/11/2019   ALKPHOS 75 07/11/2019   HCVAB >11.0 (H) 12/16/2016   LIPASE 21 04/30/2019    Electrolytes Lab Results  Component Value Date   NA 128 (L) 05/01/2019   K 5.2 (H) 05/01/2019   CL 96 (L) 05/01/2019   CALCIUM 7.7 (L) 05/01/2019   MG 2.4 12/14/2017   PHOS 4.2 05/01/2019    Bone No results found for: VD25OH, VD125OH2TOT, HC6237SE8, BT5176HY0, 25OHVITD1, 25OHVITD2, 25OHVITD3, TESTOFREE, TESTOSTERONE  Inflammation (CRP: Acute Phase) (ESR: Chronic Phase) Lab Results  Component Value Date   CRP <0.8 12/16/2016   ESRSEDRATE 117 (H) 10/12/2017   LATICACIDVEN 1.6 04/30/2019      Note: Above Lab results reviewed.  Imaging  VAS [place] ABI WITH/WO TBI LOWER EXTREMITY DOPPLER STUDY  Indications: Peripheral artery disease.   Performing Technologist: Almira Coaster RVS    Examination Guidelines: A complete evaluation includes at minimum, Doppler waveform signals and systolic blood pressure reading at the level of bilateral brachial, anterior tibial, and posterior tibial arteries, when vessel segments are accessible. Bilateral testing is considered an integral part of a complete examination. Photoelectric Plethysmograph (PPG) waveforms and toe systolic pressure readings are included as required and additional duplex testing as needed. Limited examinations for reoccurring indications may be performed as noted.    ABI Findings: +---------+------------------+-----+---------+--------+ Right    Rt Pressure (mmHg)IndexWaveform Comment   +---------+------------------+-----+---------+--------+ Brachial 218                                      +---------+------------------+-----+---------+--------+ ATA  250               1.15 triphasicNC       +---------+------------------+-----+---------+--------+ PTA      224               1.03 triphasic         +---------+------------------+-----+---------+--------+ Great Toe182               0.83 Normal            +---------+------------------+-----+---------+--------+  +---------+------------------+-----+---------+-------+ Left     Lt Pressure (mmHg)IndexWaveform Comment +---------+------------------+-----+---------+-------+ ATA      235               1.08 triphasic        +---------+------------------+-----+---------+-------+ PTA      250               1.15 triphasicNC      +---------+------------------+-----+---------+-------+ [place]               0.63 Abnormal         +---------+------------------+-----+---------+-------+  +-------+-----------+-----------+------------+------------+ ABI/TBIToday's ABIToday's TBIPrevious ABIPrevious TBI +-------+-----------+-----------+------------+------------+ Right  >1.0 [place]    .83                                 +-------+-----------+-----------+------------+------------+ Left   >1.0 [place]    .63                                 +-------+-----------+-----------+------------+------------+    Summary: Right: Resting right ankle-brachial index indicates noncompressible right lower extremity arteries. The right toe-brachial index is normal.  Left: Resting left ankle-brachial index indicates noncompressible left lower extremity arteries. The left toe-brachial index is abnormal.    *See table(s) above for measurements and observations.    Electronically signed by Hortencia Pilar MD on 01/11/2020 at 4:33:26 PM.    Final    Assessment  The primary encounter diagnosis  was Chronic pain syndrome. Diagnoses of Cervical radiculopathy, Diabetic polyneuropathy associated with type 2 diabetes mellitus ([place]), DDD (degenerative disc disease), cervical, Spondylosis of cervical region without myelopathy or radiculopathy, and Primary osteoarthritis of right knee were also pertinent to this visit.  Plan of Care  [name] has a current medication list which includes the following long-term medication(s): humira, atorvastatin, diltiazem, insulin aspart, lantus, levocetirizine, and torsemide.  Pharmacotherapy (Medications Ordered): Meds ordered this encounter  Medications  . oxyCODONE (OXY IR/ROXICODONE) 5 MG immediate release tablet    Sig: Take 1 tablet (5 mg total) by mouth 2 (two) times daily as needed for severe pain. Must last 30 days.    Dispense:  60 tablet    Refill:  0    Chronic Pain. (STOP Act - Not applicable). Fill one day early if closed on scheduled refill date.  Marland Kitchen oxyCODONE (OXY IR/ROXICODONE) 5 MG immediate release tablet    Sig: Take 1 tablet (5 mg total) by mouth 2 (two) times daily as needed for severe pain. Must last 30 days.    Dispense:  60 tablet    Refill:  0    Chronic Pain. (STOP Act - Not applicable). Fill one day early if closed on scheduled refill date.  Marland Kitchen oxyCODONE (OXY IR/ROXICODONE) 5 MG immediate release tablet    Sig: Take 1 tablet (5 mg total) by mouth 2 (two) times daily as needed for severe  pain. Must last 30 days.    Dispense:  60 tablet    Refill:  0    Chronic Pain. (STOP Act - Not applicable). Fill one day early if closed on scheduled refill date.   Follow-up plan:   Return in about 3 months (around 06/14/2020) for Medication Management, in person.    Recent Visits No visits were found meeting these conditions.  Showing recent visits within past 90 days and meeting all other requirements   Today's Visits Date Type Provider Dept  03/14/20 Office Visit Gillis Santa, MD [facility]  Showing today's  visits and meeting all other requirements   Future Appointments No visits were found meeting these conditions.  Showing future appointments within next 90 days and meeting all other requirements   I discussed the assessment and treatment plan with the patient. The patient was provided an opportunity to ask questions and all were answered. The patient agreed with the plan and demonstrated an understanding of the instructions.  Patient advised to call back or seek an in-person evaluation if the symptoms or condition worsens.  Duration of encounter: 25 minutes.  Note by: Gillis Santa, MD Date: 03/14/2020; Time: 9:50 AM

## 2020-03-15 DIAGNOSIS — D509 Iron deficiency anemia, unspecified: Secondary | ICD-10-CM | POA: Diagnosis not present

## 2020-03-15 DIAGNOSIS — D631 Anemia in chronic kidney disease: Secondary | ICD-10-CM | POA: Diagnosis not present

## 2020-03-15 DIAGNOSIS — N2581 Secondary hyperparathyroidism of renal origin: Secondary | ICD-10-CM | POA: Diagnosis not present

## 2020-03-15 DIAGNOSIS — N186 End stage renal disease: Secondary | ICD-10-CM | POA: Diagnosis not present

## 2020-03-15 DIAGNOSIS — Z992 Dependence on renal dialysis: Secondary | ICD-10-CM | POA: Diagnosis not present

## 2020-03-18 DIAGNOSIS — H4312 Vitreous hemorrhage, left eye: Secondary | ICD-10-CM | POA: Diagnosis not present

## 2020-03-18 DIAGNOSIS — N2581 Secondary hyperparathyroidism of renal origin: Secondary | ICD-10-CM | POA: Diagnosis not present

## 2020-03-18 DIAGNOSIS — E113512 Type 2 diabetes mellitus with proliferative diabetic retinopathy with macular edema, left eye: Secondary | ICD-10-CM | POA: Diagnosis not present

## 2020-03-18 DIAGNOSIS — Z992 Dependence on renal dialysis: Secondary | ICD-10-CM | POA: Diagnosis not present

## 2020-03-18 DIAGNOSIS — D631 Anemia in chronic kidney disease: Secondary | ICD-10-CM | POA: Diagnosis not present

## 2020-03-18 DIAGNOSIS — N186 End stage renal disease: Secondary | ICD-10-CM | POA: Diagnosis not present

## 2020-03-18 DIAGNOSIS — D509 Iron deficiency anemia, unspecified: Secondary | ICD-10-CM | POA: Diagnosis not present

## 2020-03-20 DIAGNOSIS — Z992 Dependence on renal dialysis: Secondary | ICD-10-CM | POA: Diagnosis not present

## 2020-03-20 DIAGNOSIS — D631 Anemia in chronic kidney disease: Secondary | ICD-10-CM | POA: Diagnosis not present

## 2020-03-20 DIAGNOSIS — D509 Iron deficiency anemia, unspecified: Secondary | ICD-10-CM | POA: Diagnosis not present

## 2020-03-20 DIAGNOSIS — N2581 Secondary hyperparathyroidism of renal origin: Secondary | ICD-10-CM | POA: Diagnosis not present

## 2020-03-20 DIAGNOSIS — N186 End stage renal disease: Secondary | ICD-10-CM | POA: Diagnosis not present

## 2020-03-21 ENCOUNTER — Telehealth: Payer: Self-pay | Admitting: Cardiovascular Disease

## 2020-03-21 DIAGNOSIS — D631 Anemia in chronic kidney disease: Secondary | ICD-10-CM | POA: Diagnosis not present

## 2020-03-21 DIAGNOSIS — D509 Iron deficiency anemia, unspecified: Secondary | ICD-10-CM | POA: Diagnosis not present

## 2020-03-21 DIAGNOSIS — Z992 Dependence on renal dialysis: Secondary | ICD-10-CM | POA: Diagnosis not present

## 2020-03-21 DIAGNOSIS — N2581 Secondary hyperparathyroidism of renal origin: Secondary | ICD-10-CM | POA: Diagnosis not present

## 2020-03-21 DIAGNOSIS — N186 End stage renal disease: Secondary | ICD-10-CM | POA: Diagnosis not present

## 2020-03-21 NOTE — Telephone Encounter (Signed)
Pt overdue for 6 month f/u hasn't been seen since 01/2019.  Please contact pt for future appointment.

## 2020-03-21 NOTE — Telephone Encounter (Signed)
LMOV to schedule  

## 2020-03-21 NOTE — Telephone Encounter (Signed)
*  STAT* If patient is at the pharmacy, call can be transferred to refill team.   1. Which medications need to be refilled? (please list name of each medication and dose if known) diltiazem 60 MG 1 tablet 3 times daily  2. Which pharmacy/location (including street and city if local pharmacy) is medication to be sent to? [place]   3. Do they need a 30 day or 90 day supply? 30 day

## 2020-03-22 DIAGNOSIS — N186 End stage renal disease: Secondary | ICD-10-CM | POA: Diagnosis not present

## 2020-03-22 DIAGNOSIS — D631 Anemia in chronic kidney disease: Secondary | ICD-10-CM | POA: Diagnosis not present

## 2020-03-22 DIAGNOSIS — Z992 Dependence on renal dialysis: Secondary | ICD-10-CM | POA: Diagnosis not present

## 2020-03-22 DIAGNOSIS — D509 Iron deficiency anemia, unspecified: Secondary | ICD-10-CM | POA: Diagnosis not present

## 2020-03-22 DIAGNOSIS — N2581 Secondary hyperparathyroidism of renal origin: Secondary | ICD-10-CM | POA: Diagnosis not present

## 2020-03-22 LAB — DRUG SCREEN 10 W/CONF, SERUM

## 2020-03-25 DIAGNOSIS — D631 Anemia in chronic kidney disease: Secondary | ICD-10-CM | POA: Diagnosis not present

## 2020-03-25 DIAGNOSIS — N2581 Secondary hyperparathyroidism of renal origin: Secondary | ICD-10-CM | POA: Diagnosis not present

## 2020-03-25 DIAGNOSIS — N186 End stage renal disease: Secondary | ICD-10-CM | POA: Diagnosis not present

## 2020-03-25 DIAGNOSIS — Z992 Dependence on renal dialysis: Secondary | ICD-10-CM | POA: Diagnosis not present

## 2020-03-25 DIAGNOSIS — D509 Iron deficiency anemia, unspecified: Secondary | ICD-10-CM | POA: Diagnosis not present

## 2020-03-26 ENCOUNTER — Other Ambulatory Visit: Payer: Self-pay

## 2020-03-26 ENCOUNTER — Encounter: Payer: Self-pay | Admitting: Gastroenterology

## 2020-03-26 ENCOUNTER — Ambulatory Visit (INDEPENDENT_AMBULATORY_CARE_PROVIDER_SITE_OTHER): Payer: Medicare Other | Admitting: Gastroenterology

## 2020-03-26 VITALS — BP 185/78 | HR 84 | Temp 98.5°F | Ht 67.0 in | Wt 229.4 lb

## 2020-03-26 DIAGNOSIS — K7469 Other cirrhosis of liver: Secondary | ICD-10-CM | POA: Diagnosis not present

## 2020-03-26 MED ORDER — SUPREP BOWEL PREP KIT 17.5-3.13-1.6 GM/177ML PO SOLN: 1.0000 | ORAL | 0 refills | Status: DC

## 2020-03-26 NOTE — Progress Notes (Signed)
Primary Care Physician: Olin Hauser, DO  Primary Gastroenterologist:  Dr. [name]  Chief Complaint  Patient presents with  . RUQ abdominal pain  . Diarrhea    HPI: [name] is a 54 y.o. female here with a history of seeing me for dysphagia.  The patient had esophageal motility on the upper GI and was sent for manometry.  The patient had some transportation issues with going to [facility] to have a manometry and I do not see any record of the manometry ever being done. The patient reports that she had problems Had been treated for hepatitis C and completed treatment 2 years ago. The patient reports that she had some significant pain in the right side of her abdomen that went to the left side of the abdomen and lasted for a few months but now has completely resolved.  She is not sure what caused the symptoms but she was told by her primary care provider that it was likely musculoskeletal.  The patient has also had some issues with diarrhea.  Past Medical History:  Diagnosis Date  . Allergy   . Anemia   . Anxiety    worse when not at home - bowel incontinence  . Arthritis   . Ataxia   . COPD (chronic obstructive pulmonary disease) ([place])   . Depression   . Diabetes mellitus with complication ([place])   . Dialysis patient ([place])   . ESRD on hemodialysis ([place])    MWF dialysis  . Fistula    left upper arm  . Hepatitis 2003   Hep C  . Hiatal hernia   . Hypercholesterolemia   . Hypertension   . Migraine   . Neuropathy, diabetic ([facility])    lower legs  . Non-alcoholic cirrhosis ([place])   . Peripheral vascular disease ([place])   . Pneumonia 2015  . Psoriasis   . Renal insufficiency   . Tobacco dependence   . Wears dentures    full upper, partial lower    Current Outpatient Medications  Medication Sig Dispense Refill  . Adalimumab (HUMIRA) 40 MG/0.4ML PSKT Inject 40 mg into the skin every 14 (fourteen) days.    Marland Kitchen amitriptyline (ELAVIL) 25 MG tablet Take 25 mg by mouth  at bedtime.    Marland Kitchen atorvastatin (LIPITOR) 20 MG tablet Take 1 tablet (20 mg total) by mouth at bedtime. 90 tablet 1  . atropine 1 % ophthalmic solution Place 1 drop into the left eye 2 (two) times daily.    . brimonidine (ALPHAGAN) 0.2 % ophthalmic solution Place 1 drop into the left eye 3 (three) times daily.    . calcium acetate (PHOSLO) 667 MG capsule Take 1,334 mg by mouth 3 (three) times daily with meals.     . cyclobenzaprine (FLEXERIL) 5 MG tablet Take 5 mg by mouth 2 (two) times daily as needed.    . diclofenac Sodium (VOLTAREN) 1 % GEL Apply 2 g topically 4 (four) times daily.    Marland Kitchen diltiazem (CARDIZEM) 60 MG tablet Take 1 tablet (60 mg total) by mouth 3 (three) times daily. 90 tablet 0  . dorzolamidel-timolol (COSOPT) 22.3-6.8 MG/ML SOLN ophthalmic solution 1 drop 2 (two) times daily.    . ferric citrate (AURYXIA) 1 GM 210 MG(Fe) tablet Take 420 mg by mouth 3 (three) times daily with meals.    Marland Kitchen glucose blood (TRUE METRIX BLOOD GLUCOSE TEST) test strip Use as instructed 100 each 12  . insulin aspart (NOVOLOG) 100 UNIT/ML injection Inject  2 Units into the skin 3 (three) times daily before meals. 10 mL 2  . ketoconazole (NIZORAL) 2 % cream APPLY TOPICALLY ONTO THE SKIN (BOTH FEET) TWICE A DAY AS NEEDED 15 g 5  . LANTUS 100 UNIT/ML injection INJECT 10 UNITS SUBCUTANEOUSLY ONCE A DAY 10 mL 2  . levocetirizine (XYZAL) 5 MG tablet TAKE ONE TABLET BY MOUTH EVERY EVENING 30 tablet 3  . omeprazole (PRILOSEC) 40 MG capsule Take 40 mg by mouth daily.    . ondansetron (ZOFRAN) 4 MG tablet Take 4 mg by mouth 2 (two) times daily as needed.    Marland Kitchen oxyCODONE (OXY IR/ROXICODONE) 5 MG immediate release tablet Take 1 tablet (5 mg total) by mouth 2 (two) times daily as needed for severe pain. Must last 30 days. 60 tablet 0  . [START ON 04/21/2020] oxyCODONE (OXY IR/ROXICODONE) 5 MG immediate release tablet Take 1 tablet (5 mg total) by mouth 2 (two) times daily as needed for severe pain. Must last 30 days. 60  tablet 0  . [START ON 05/21/2020] oxyCODONE (OXY IR/ROXICODONE) 5 MG immediate release tablet Take 1 tablet (5 mg total) by mouth 2 (two) times daily as needed for severe pain. Must last 30 days. 60 tablet 0  . prednisoLONE acetate (PRED FORTE) 1 % ophthalmic suspension     . SYMBICORT 160-4.5 MCG/ACT inhaler SMARTSIG:2 Puff(s) By Mouth Twice Daily    . torsemide (DEMADEX) 100 MG tablet Take 100 mg by mouth daily.    Karen Chafe INSULIN SYRINGE 29G X 1/2" 0.5 ML MISC USE 4 TIMES A DAY WITH LANTUS & NOVOLOG 120 each 2  . VENTOLIN HFA 108 (90 Base) MCG/ACT inhaler Inhale 2 puffs into the lungs 4 (four) times daily as needed for wheezing or shortness of breath.      No current facility-administered medications for this visit.    Allergies as of 03/26/2020 - Review Complete 03/26/2020  Allergen Reaction Noted  . Cinnamon Anaphylaxis 10/11/2015  . Garlic Anaphylaxis and Hives 11/28/2014  . Onion Hives and Swelling 11/28/2014  . Tylenol [acetaminophen] Anaphylaxis 07/25/2015  . Ciprofloxacin Diarrhea 11/05/2016  . Prednisone Other (See Comments) 01/05/2017    ROS:  General: Negative for anorexia, weight loss, fever, chills, fatigue, weakness. ENT: Negative for hoarseness, difficulty swallowing , nasal congestion. CV: Negative for chest pain, angina, palpitations, dyspnea on exertion, peripheral edema.  Respiratory: Negative for dyspnea at rest, dyspnea on exertion, cough, sputum, wheezing.  GI: See history of present illness. GU:  Negative for dysuria, hematuria, urinary incontinence, urinary frequency, nocturnal urination.  Endo: Negative for unusual weight change.    Physical Examination:   BP (!) 185/78   Pulse 84   Temp 98.5 F (36.9 C) (Oral)   Ht 5\' 7"  (1.702 m)   Wt 229 lb 6.4 oz (104.1 kg)   BMI 35.93 kg/m   General: Well-nourished, well-developed in no acute distress.  Eyes: No icterus. Conjunctivae pink. Lungs: Clear to auscultation bilaterally. Non-labored. Heart:  Regular rate and rhythm, no murmurs rubs or gallops.  Abdomen: Bowel sounds are normal, Positive tenderness with flexion of the abdominal wall muscles, nondistended, no hepatosplenomegaly or masses, no abdominal bruits or hernia , no rebound or guarding.   Extremities: No lower extremity edema. No clubbing or deformities. Neuro: Alert and oriented x 3.  Grossly intact. Skin: Warm and dry, no jaundice.   Psych: Alert and cooperative, normal mood and affect.  Labs:    Imaging Studies: No results found.  Assessment and Plan:  [name] is a 54 y.o. y/o female Who comes in today with a history of multiple polyps and a need a repeat colonoscopy.  The patient also has esophageal dysmotility and will be set up for manometry.  The patient will also be set up for liver enzymes make sure her liver enzymes are still normal.  The patient will follow up at the time of the colonoscopy.     [name], MD. [facility]al    Note: This dictation was prepared with Dragon dictation along with smaller phrase technology. Any transcriptional errors that result from this process are unintentional.

## 2020-03-27 ENCOUNTER — Other Ambulatory Visit: Payer: Self-pay

## 2020-03-27 DIAGNOSIS — Z86010 Personal history of colonic polyps: Secondary | ICD-10-CM

## 2020-03-27 DIAGNOSIS — D509 Iron deficiency anemia, unspecified: Secondary | ICD-10-CM | POA: Diagnosis not present

## 2020-03-27 DIAGNOSIS — N186 End stage renal disease: Secondary | ICD-10-CM | POA: Diagnosis not present

## 2020-03-27 DIAGNOSIS — D631 Anemia in chronic kidney disease: Secondary | ICD-10-CM | POA: Diagnosis not present

## 2020-03-27 DIAGNOSIS — Z992 Dependence on renal dialysis: Secondary | ICD-10-CM | POA: Diagnosis not present

## 2020-03-27 DIAGNOSIS — N2581 Secondary hyperparathyroidism of renal origin: Secondary | ICD-10-CM | POA: Diagnosis not present

## 2020-03-27 LAB — HEPATIC FUNCTION PANEL
ALT: 12 IU/L (ref 0–32)
AST: 13 IU/L (ref 0–40)
Albumin: 4.2 g/dL (ref 3.8–4.9)
Alkaline Phosphatase: 72 IU/L (ref 39–117)
Bilirubin Total: 0.3 mg/dL (ref 0.0–1.2)
Bilirubin, Direct: 0.08 mg/dL (ref 0.00–0.40)
Total Protein: 7.2 g/dL (ref 6.0–8.5)

## 2020-03-28 ENCOUNTER — Telehealth: Payer: Self-pay

## 2020-03-28 NOTE — Telephone Encounter (Signed)
Left vm informing pt of results.

## 2020-03-28 NOTE — Telephone Encounter (Signed)
-----   Message from [name], MD sent at 03/28/2020  6:30 AM EDT ----- The patient know that her liver enzymes are normal.

## 2020-03-29 DIAGNOSIS — D509 Iron deficiency anemia, unspecified: Secondary | ICD-10-CM | POA: Diagnosis not present

## 2020-03-29 DIAGNOSIS — N186 End stage renal disease: Secondary | ICD-10-CM | POA: Diagnosis not present

## 2020-03-29 DIAGNOSIS — Z992 Dependence on renal dialysis: Secondary | ICD-10-CM | POA: Diagnosis not present

## 2020-03-29 DIAGNOSIS — N2581 Secondary hyperparathyroidism of renal origin: Secondary | ICD-10-CM | POA: Diagnosis not present

## 2020-03-29 DIAGNOSIS — D631 Anemia in chronic kidney disease: Secondary | ICD-10-CM | POA: Diagnosis not present

## 2020-04-01 DIAGNOSIS — D509 Iron deficiency anemia, unspecified: Secondary | ICD-10-CM | POA: Diagnosis not present

## 2020-04-01 DIAGNOSIS — D631 Anemia in chronic kidney disease: Secondary | ICD-10-CM | POA: Diagnosis not present

## 2020-04-01 DIAGNOSIS — Z992 Dependence on renal dialysis: Secondary | ICD-10-CM | POA: Diagnosis not present

## 2020-04-01 DIAGNOSIS — N186 End stage renal disease: Secondary | ICD-10-CM | POA: Diagnosis not present

## 2020-04-01 DIAGNOSIS — N2581 Secondary hyperparathyroidism of renal origin: Secondary | ICD-10-CM | POA: Diagnosis not present

## 2020-04-03 DIAGNOSIS — N186 End stage renal disease: Secondary | ICD-10-CM | POA: Diagnosis not present

## 2020-04-03 DIAGNOSIS — D631 Anemia in chronic kidney disease: Secondary | ICD-10-CM | POA: Diagnosis not present

## 2020-04-03 DIAGNOSIS — D509 Iron deficiency anemia, unspecified: Secondary | ICD-10-CM | POA: Diagnosis not present

## 2020-04-03 DIAGNOSIS — N2581 Secondary hyperparathyroidism of renal origin: Secondary | ICD-10-CM | POA: Diagnosis not present

## 2020-04-03 DIAGNOSIS — Z992 Dependence on renal dialysis: Secondary | ICD-10-CM | POA: Diagnosis not present

## 2020-04-05 DIAGNOSIS — N2581 Secondary hyperparathyroidism of renal origin: Secondary | ICD-10-CM | POA: Diagnosis not present

## 2020-04-05 DIAGNOSIS — D631 Anemia in chronic kidney disease: Secondary | ICD-10-CM | POA: Diagnosis not present

## 2020-04-05 DIAGNOSIS — Z992 Dependence on renal dialysis: Secondary | ICD-10-CM | POA: Diagnosis not present

## 2020-04-05 DIAGNOSIS — N186 End stage renal disease: Secondary | ICD-10-CM | POA: Diagnosis not present

## 2020-04-05 DIAGNOSIS — D509 Iron deficiency anemia, unspecified: Secondary | ICD-10-CM | POA: Diagnosis not present

## 2020-04-08 DIAGNOSIS — D631 Anemia in chronic kidney disease: Secondary | ICD-10-CM | POA: Diagnosis not present

## 2020-04-08 DIAGNOSIS — N186 End stage renal disease: Secondary | ICD-10-CM | POA: Diagnosis not present

## 2020-04-08 DIAGNOSIS — D509 Iron deficiency anemia, unspecified: Secondary | ICD-10-CM | POA: Diagnosis not present

## 2020-04-08 DIAGNOSIS — N2581 Secondary hyperparathyroidism of renal origin: Secondary | ICD-10-CM | POA: Diagnosis not present

## 2020-04-08 DIAGNOSIS — Z992 Dependence on renal dialysis: Secondary | ICD-10-CM | POA: Diagnosis not present

## 2020-04-10 DIAGNOSIS — D509 Iron deficiency anemia, unspecified: Secondary | ICD-10-CM | POA: Diagnosis not present

## 2020-04-10 DIAGNOSIS — N186 End stage renal disease: Secondary | ICD-10-CM | POA: Diagnosis not present

## 2020-04-10 DIAGNOSIS — Z992 Dependence on renal dialysis: Secondary | ICD-10-CM | POA: Diagnosis not present

## 2020-04-10 DIAGNOSIS — D631 Anemia in chronic kidney disease: Secondary | ICD-10-CM | POA: Diagnosis not present

## 2020-04-10 DIAGNOSIS — N2581 Secondary hyperparathyroidism of renal origin: Secondary | ICD-10-CM | POA: Diagnosis not present

## 2020-04-11 ENCOUNTER — Other Ambulatory Visit: Payer: Self-pay

## 2020-04-11 ENCOUNTER — Encounter (INDEPENDENT_AMBULATORY_CARE_PROVIDER_SITE_OTHER): Payer: Self-pay | Admitting: Vascular Surgery

## 2020-04-11 ENCOUNTER — Ambulatory Visit (INDEPENDENT_AMBULATORY_CARE_PROVIDER_SITE_OTHER): Payer: Medicare Other | Admitting: Vascular Surgery

## 2020-04-11 ENCOUNTER — Ambulatory Visit (INDEPENDENT_AMBULATORY_CARE_PROVIDER_SITE_OTHER): Payer: Medicare Other

## 2020-04-11 VITALS — BP 179/77 | HR 82 | Resp 16 | Wt 231.2 lb

## 2020-04-11 DIAGNOSIS — Z992 Dependence on renal dialysis: Secondary | ICD-10-CM | POA: Diagnosis not present

## 2020-04-11 DIAGNOSIS — M5030 Other cervical disc degeneration, unspecified cervical region: Secondary | ICD-10-CM | POA: Diagnosis not present

## 2020-04-11 DIAGNOSIS — E1122 Type 2 diabetes mellitus with diabetic chronic kidney disease: Secondary | ICD-10-CM | POA: Diagnosis not present

## 2020-04-11 DIAGNOSIS — I10 Essential (primary) hypertension: Secondary | ICD-10-CM

## 2020-04-11 DIAGNOSIS — N186 End stage renal disease: Secondary | ICD-10-CM | POA: Diagnosis not present

## 2020-04-11 DIAGNOSIS — T829XXS Unspecified complication of cardiac and vascular prosthetic device, implant and graft, sequela: Secondary | ICD-10-CM | POA: Diagnosis not present

## 2020-04-11 DIAGNOSIS — I739 Peripheral vascular disease, unspecified: Secondary | ICD-10-CM

## 2020-04-11 NOTE — Progress Notes (Signed)
MRN : [id number]  [name] is a 54 y.o. (1966-08-28) female who presents with chief complaint of No chief complaint on file. Marland Kitchen  History of Present Illness:  The patient returns to the office for follow up regarding problem with the dialysis access. Currently the patient is maintained via a left brachial cephalic fistula    The patient notes a significant increase in bleeding time after decannulation.  The patient has also been informed that there is increased recirculation.    The patient denies hand pain or other symptoms consistent with steal phenomena.  No significant arm swelling.  The patient denies redness or swelling at the access site. The patient denies fever or chills at home or while on dialysis.  The patient denies amaurosis fugax or recent TIA symptoms. There are no recent neurological changes noted. The patient denies claudication symptoms or rest pain symptoms. The patient denies history of DVT, PE or superficial thrombophlebitis. The patient denies recent episodes of angina or shortness of breath.    Duplex ultrasound of the AV access shows a patent access.  The previously noted stenosis is worse compared to last study.       No outpatient medications have been marked as taking for the 04/11/20 encounter (Appointment) with Delana Meyer, Dolores Lory, MD.    Past Medical History:  Diagnosis Date  . Allergy   . Anemia   . Anxiety    worse when not at home - bowel incontinence  . Arthritis   . Ataxia   . COPD (chronic obstructive pulmonary disease) ([place])   . Depression   . Diabetes mellitus with complication ([place])   . Dialysis patient ([place])   . ESRD on hemodialysis ([place])    MWF dialysis  . Fistula    left upper arm  . Hepatitis 2003   Hep C  . Hiatal hernia   . Hypercholesterolemia   . Hypertension   . Migraine   . Neuropathy, diabetic (HCC)    lower legs  . Non-alcoholic cirrhosis ([place])   . Peripheral vascular disease ([place])   . Pneumonia 2015  .  Psoriasis   . Renal insufficiency   . Tobacco dependence   . Wears dentures    full upper, partial lower    Past Surgical History:  Procedure Laterality Date  . A/V SHUNT INTERVENTION N/A 12/16/2017   Procedure: A/V SHUNT INTERVENTION;  Surgeon: Algernon Huxley, MD;  Location: [place] [facility];  Service: Cardiovascular;  Laterality: N/A;  . AQUEOUS SHUNT Left 12/20/2019   Procedure: AHMED TUBE SHUNT WITH TUTOPLAST AND AC WASHOUT LEFT DIABETIC;  Surgeon: Leandrew Koyanagi, MD;  Location: [place];  Service: Ophthalmology;  Laterality: Left;  Diabetic - insulin  . AV FISTULA PLACEMENT Left 11/2014  . CESAREAN SECTION    . CHOLECYSTECTOMY    . COLONOSCOPY N/A 09/22/2017   Procedure: COLONOSCOPY;  Surgeon: Lin Landsman, MD;  Location: [place];  Service: Endoscopy;  Laterality: N/A;  . cyst removed  from left hand Left 1989  . DIALYSIS/PERMA CATHETER INSERTION N/A 05/20/2017   Procedure: Dialysis/Perma Catheter Insertion and fistulagram/LUE angiogram;  Surgeon: Algernon Huxley, MD;  Location: [place] [facility];  Service: Cardiovascular;  Laterality: N/A;  . ESOPHAGOGASTRODUODENOSCOPY N/A 09/22/2017   Procedure: ESOPHAGOGASTRODUODENOSCOPY (EGD);  Surgeon: Lin Landsman, MD;  Location: [place];  Service: Endoscopy;  Laterality: N/A;  . INCISION AND DRAINAGE ABSCESS N/A 07/16/2016   Procedure: INCISION AND DRAINAGE ABSCESS;  Surgeon: Jeannie Fend  Vaught, MD;  Location: [facility];  Service: ENT;  Laterality: N/A;  . PERIPHERAL VASCULAR CATHETERIZATION N/A 07/25/2015   Procedure: A/V Shuntogram/Fistulagram;  Surgeon: Algernon Huxley, MD;  Location: [place] [facility];  Service: Cardiovascular;  Laterality: N/A;  . PERIPHERAL VASCULAR CATHETERIZATION Left 07/25/2015   Procedure: A/V Shunt Intervention;  Surgeon: Algernon Huxley, MD;  Location: [place] [facility];  Service: Cardiovascular;  Laterality: Left;  . PERIPHERAL VASCULAR CATHETERIZATION Left 10/07/2015     Procedure: A/V Shuntogram/Fistulagram;  Surgeon: Algernon Huxley, MD;  Location: [place] [facility];  Service: Cardiovascular;  Laterality: Left;  . PERIPHERAL VASCULAR CATHETERIZATION N/A 10/07/2015   Procedure: A/V Shunt Intervention;  Surgeon: Algernon Huxley, MD;  Location: [place] [facility];  Service: Cardiovascular;  Laterality: N/A;  . PERIPHERAL VASCULAR CATHETERIZATION  10/07/2015   Procedure: Dialysis/Perma Catheter Insertion;  Surgeon: Algernon Huxley, MD;  Location: [place] [facility];  Service: Cardiovascular;;  . PERIPHERAL VASCULAR CATHETERIZATION N/A 12/17/2015   Procedure: Dialysis/Perma Catheter Removal;  Surgeon: Katha Cabal, MD;  Location: [place] [facility];  Service: Cardiovascular;  Laterality: N/A;  . PERIPHERAL VASCULAR CATHETERIZATION N/A 01/11/2017   Procedure: Visceral Angiography;  Surgeon: Algernon Huxley, MD;  Location: [place] [facility];  Service: Cardiovascular;  Laterality: N/A;  . PERIPHERAL VASCULAR CATHETERIZATION N/A 01/11/2017   Procedure: Visceral Artery Intervention;  Surgeon: Algernon Huxley, MD;  Location: [place] [facility];  Service: Cardiovascular;  Laterality: N/A;  . POLYPECTOMY  09/22/2017   Procedure: POLYPECTOMY;  Surgeon: Lin Landsman, MD;  Location: [place];  Service: Endoscopy;;  . rt. tubal and ovary removed    . STENT PLACEMENT VASCULAR ([place] HX) Left 03/2018   Performed at [facility] using EV3 protg GPS stent graph [id number] lot [id number]  . TONSILLECTOMY    . TUBAL LIGATION    . UPPER EXTREMITY ANGIOGRAPHY Left 09/29/2019   Procedure: UPPER EXTREMITY ANGIOGRAPHY;  Surgeon: Katha Cabal, MD;  Location: [place] [facility];  Service: Cardiovascular;  Laterality: Left;    Social History Social History   Tobacco Use  . Smoking status: Light Tobacco Smoker    Packs/day: 0.25    Years: 20.00    Pack years: 5.00    Types: Cigarettes    Last attempt to quit: 05/21/2017    Years since  quitting: 2.8  . Smokeless tobacco: Never Used  Substance Use Topics  . Alcohol use: No  . Drug use: No    Family History Family History  Problem Relation Age of Onset  . Hypertension Mother   . Heart disease Mother   . Hypertension Father   . Skin cancer Father     Allergies  Allergen Reactions  . Cinnamon Anaphylaxis  . Garlic Anaphylaxis and Hives  . Onion Hives and Swelling  . Tylenol [Acetaminophen] Anaphylaxis  . Ciprofloxacin Diarrhea  . Prednisone Other (See Comments)    Vaginal blisters & oral blisters      REVIEW OF SYSTEMS (Negative unless checked)  Constitutional: [] Weight loss  [] Fever  [] Chills Cardiac: [] Chest pain   [] Chest pressure   [] Palpitations   [] Shortness of breath when laying flat   [] Shortness of breath with exertion. Vascular:  [] Pain in legs with walking   [] Pain in legs at rest  [] History of DVT   [] Phlebitis   [] Swelling in legs   [] Varicose veins   [] Non-healing ulcers Pulmonary:   [] Uses home oxygen   [] Productive cough   []   Hemoptysis   [] Wheeze  [] COPD   [] Asthma Neurologic:  [] Dizziness   [] Seizures   [] History of stroke   [] History of TIA  [] Aphasia   [] Vissual changes   [] Weakness or numbness in arm   [] Weakness or numbness in leg Musculoskeletal:   [] Joint swelling   [] Joint pain   [] Low back pain Hematologic:  [] Easy bruising  [] Easy bleeding   [] Hypercoagulable state   [] Anemic Gastrointestinal:  [] Diarrhea   [] Vomiting  [] Gastroesophageal reflux/heartburn   [] Difficulty swallowing. Genitourinary:  [x] Chronic kidney disease   [] Difficult urination  [] Frequent urination   [] Blood in urine Skin:  [] Rashes   [] Ulcers  Psychological:  [] History of anxiety   []  History of major depression.  Physical Examination  There were no vitals filed for this visit. There is no height or weight on file to calculate BMI. Gen: WD/WN, NAD Head: [place]/AT, No temporalis wasting.  Ear/Nose/Throat: Hearing grossly intact, nares w/o erythema or  drainage Eyes: PER, EOMI, sclera nonicteric.  Neck: Supple, no large masses.   Pulmonary:  Good air movement, no audible wheezing bilaterally, no use of accessory muscles.  Cardiac: RRR, no JVD Vascular: left brachial weak thrill and poor bruit Vessel Right Left  Radial Palpable Palpable  Brachial Palpable Palpable  Gastrointestinal: Non-distended. No guarding/no peritoneal signs.  Musculoskeletal: M/S 5/5 throughout.  No deformity or atrophy.  Neurologic: CN 2-12 intact. Symmetrical.  Speech is fluent. Motor exam as listed above. Psychiatric: Judgment intact, Mood & affect appropriate for pt's clinical situation. Dermatologic: No rashes or ulcers noted.  No changes consistent with cellulitis.  CBC Lab Results  Component Value Date   WBC 6.4 05/01/2019   HGB 9.3 (L) 05/01/2019   HCT 27.8 (L) 05/01/2019   MCV 91.4 05/01/2019   PLT 78 (L) 05/01/2019    BMET    Component Value Date/Time   NA 128 (L) 05/01/2019 0015   NA 136 (A) 12/08/2017 0000   NA 133 (L) 10/31/2014 0955   K 5.2 (H) 05/01/2019 0015   K 4.7 10/31/2014 0955   CL 96 (L) 05/01/2019 0015   CL 102 10/31/2014 0955   CO2 21 (L) 05/01/2019 0015   CO2 27 10/31/2014 0955   GLUCOSE 152 (H) 05/01/2019 0015   GLUCOSE 203 (H) 10/31/2014 0955   BUN 52 (H) 05/01/2019 0015   BUN 35 (H) 10/31/2014 0955   CREATININE 7.50 (H) 05/01/2019 0015   CREATININE 7.43 (H) 12/27/2017 1035   CALCIUM 7.7 (L) 05/01/2019 0015   CALCIUM 7.7 (L) 10/31/2014 0955   GFRNONAA 6 (L) 05/01/2019 0015   GFRNONAA 6 (L) 12/27/2017 1035   GFRAA 6 (L) 05/01/2019 0015   GFRAA 7 (L) 12/27/2017 1035   CrCl cannot be calculated (Patient's most recent lab result is older than the maximum 21 days allowed.).  COAG Lab Results  Component Value Date   INR 1.2 04/30/2019   INR 1.05 04/07/2018   INR 1.01 10/12/2017    Radiology No results found.   Assessment/Plan 1. Complication from renal dialysis device, sequela Recommend:  The patient is  experiencing increasing problems with their dialysis access.  Patient should have a fistulagram with the intention for intervention.  The intention for intervention is to restore appropriate flow and prevent thrombosis and possible loss of the access.  As well as improve the quality of dialysis therapy.  The risks, benefits and alternative therapies were reviewed in detail with the patient.  All questions were answered.  The patient agrees to proceed with angio/intervention.  2. Type 2 diabetes mellitus with ESRD (end-stage renal disease) ([place]) Continue hypoglycemic medications as already ordered, these medications have been reviewed and there are no changes at this time.  Hgb A1C to be monitored as already arranged by primary service   3. PAD (peripheral artery disease) (HCC)  Recommend:  The patient has evidence of atherosclerosis of the lower extremities with claudication.  The patient does not voice lifestyle limiting changes at this point in time.  Noninvasive studies do not suggest clinically significant change.  No invasive studies, angiography or surgery at this time The patient should continue walking and begin a more formal exercise program.  The patient should continue antiplatelet therapy and aggressive treatment of the lipid abnormalities  No changes in the patient's medications at this time  The patient should continue wearing graduated compression socks 10-15 mmHg strength to control the mild edema.    4. Essential hypertension Continue antihypertensive medications as already ordered, these medications have been reviewed and there are no changes at this time.   5. ESRD (end stage renal disease) on dialysis ([place]) At the present time the patient does not have adequate dialysis access. Arrangements will be made to correct this as noted above.  Continue hemodialysis as ordered without interruption.  Avoid nephrotoxic medications and dehydration.  Further plans  per nephrology    Hortencia Pilar, MD  04/11/2020 1:21 PM

## 2020-04-12 ENCOUNTER — Other Ambulatory Visit
Admission: RE | Admit: 2020-04-12 | Discharge: 2020-04-12 | Disposition: A | Discharge status: Home / Self Care | Payer: Medicare Other | Source: Ambulatory Visit | Attending: Gastroenterology | Admitting: Gastroenterology

## 2020-04-12 DIAGNOSIS — Z20822 Contact with and (suspected) exposure to covid-19: Secondary | ICD-10-CM | POA: Insufficient documentation

## 2020-04-12 DIAGNOSIS — N186 End stage renal disease: Secondary | ICD-10-CM | POA: Diagnosis not present

## 2020-04-12 DIAGNOSIS — N2581 Secondary hyperparathyroidism of renal origin: Secondary | ICD-10-CM | POA: Diagnosis not present

## 2020-04-12 DIAGNOSIS — D509 Iron deficiency anemia, unspecified: Secondary | ICD-10-CM | POA: Diagnosis not present

## 2020-04-12 DIAGNOSIS — Z992 Dependence on renal dialysis: Secondary | ICD-10-CM | POA: Diagnosis not present

## 2020-04-12 DIAGNOSIS — Z01812 Encounter for preprocedural laboratory examination: Secondary | ICD-10-CM | POA: Insufficient documentation

## 2020-04-12 DIAGNOSIS — D631 Anemia in chronic kidney disease: Secondary | ICD-10-CM | POA: Diagnosis not present

## 2020-04-12 LAB — SARS CORONAVIRUS 2 (TAT 6-24 HRS): SARS Coronavirus 2: NEGATIVE

## 2020-04-13 ENCOUNTER — Encounter (INDEPENDENT_AMBULATORY_CARE_PROVIDER_SITE_OTHER): Payer: Self-pay | Admitting: Vascular Surgery

## 2020-04-14 ENCOUNTER — Other Ambulatory Visit: Payer: Self-pay | Admitting: Family Medicine

## 2020-04-14 DIAGNOSIS — E1122 Type 2 diabetes mellitus with diabetic chronic kidney disease: Secondary | ICD-10-CM

## 2020-04-14 DIAGNOSIS — N186 End stage renal disease: Secondary | ICD-10-CM

## 2020-04-14 NOTE — Telephone Encounter (Signed)
Requested Prescriptions  Pending Prescriptions Disp Refills  . TRUEPLUS INSULIN SYRINGE 29G X 1/2" 0.5 ML MISC [Pharmacy Med Name: TRUEplus Insulin 0.5 mL 29 gauge x 1/2" syringe] 120 each 2    Sig: USE 4 TIMES A DAY WITH LANTUS & NOVOLOG     There is no refill protocol information for this order

## 2020-04-15 ENCOUNTER — Telehealth (INDEPENDENT_AMBULATORY_CARE_PROVIDER_SITE_OTHER): Payer: Self-pay

## 2020-04-15 ENCOUNTER — Encounter (INDEPENDENT_AMBULATORY_CARE_PROVIDER_SITE_OTHER): Payer: Self-pay

## 2020-04-15 DIAGNOSIS — D631 Anemia in chronic kidney disease: Secondary | ICD-10-CM | POA: Diagnosis not present

## 2020-04-15 DIAGNOSIS — N2581 Secondary hyperparathyroidism of renal origin: Secondary | ICD-10-CM | POA: Diagnosis not present

## 2020-04-15 DIAGNOSIS — Z794 Long term (current) use of insulin: Secondary | ICD-10-CM | POA: Diagnosis not present

## 2020-04-15 DIAGNOSIS — D509 Iron deficiency anemia, unspecified: Secondary | ICD-10-CM | POA: Diagnosis not present

## 2020-04-15 DIAGNOSIS — N186 End stage renal disease: Secondary | ICD-10-CM | POA: Diagnosis not present

## 2020-04-15 DIAGNOSIS — E119 Type 2 diabetes mellitus without complications: Secondary | ICD-10-CM | POA: Diagnosis not present

## 2020-04-15 DIAGNOSIS — Z992 Dependence on renal dialysis: Secondary | ICD-10-CM | POA: Diagnosis not present

## 2020-04-15 NOTE — Telephone Encounter (Signed)
Patient called back and is now scheduled with Dr. Delana Meyer for a left arm fistulagram on 04/23/20 with a 10:45 am arrival time to the [facility]. Patient will do covid testing on 04/19/20 between 8-1 pm at the [facility]. Pre-procedure instructions were discussed and will be faxed to [facility] as requested.

## 2020-04-15 NOTE — Telephone Encounter (Signed)
I attempted to contact the patient to schedule a left arm fistulagram, a message was left for a return call.

## 2020-04-16 ENCOUNTER — Ambulatory Visit: Payer: Medicare Other | Admitting: Anesthesiology

## 2020-04-16 ENCOUNTER — Ambulatory Visit
Admission: RE | Admit: 2020-04-16 | Discharge: 2020-04-16 | Disposition: A | Discharge status: Home / Self Care | Payer: Medicare Other | Attending: Gastroenterology | Admitting: Gastroenterology

## 2020-04-16 ENCOUNTER — Encounter
Admission: RE | Disposition: A | Discharge status: Home / Self Care | Payer: Self-pay | Source: Home / Self Care | Attending: Gastroenterology

## 2020-04-16 ENCOUNTER — Encounter: Payer: Self-pay | Admitting: Gastroenterology

## 2020-04-16 ENCOUNTER — Other Ambulatory Visit: Payer: Self-pay

## 2020-04-16 DIAGNOSIS — Z7951 Long term (current) use of inhaled steroids: Secondary | ICD-10-CM | POA: Diagnosis not present

## 2020-04-16 DIAGNOSIS — E7800 Pure hypercholesterolemia, unspecified: Secondary | ICD-10-CM | POA: Diagnosis not present

## 2020-04-16 DIAGNOSIS — K5730 Diverticulosis of large intestine without perforation or abscess without bleeding: Secondary | ICD-10-CM | POA: Diagnosis not present

## 2020-04-16 DIAGNOSIS — I120 Hypertensive chronic kidney disease with stage 5 chronic kidney disease or end stage renal disease: Secondary | ICD-10-CM | POA: Insufficient documentation

## 2020-04-16 DIAGNOSIS — D123 Benign neoplasm of transverse colon: Secondary | ICD-10-CM | POA: Diagnosis not present

## 2020-04-16 DIAGNOSIS — F419 Anxiety disorder, unspecified: Secondary | ICD-10-CM | POA: Insufficient documentation

## 2020-04-16 DIAGNOSIS — F418 Other specified anxiety disorders: Secondary | ICD-10-CM | POA: Diagnosis not present

## 2020-04-16 DIAGNOSIS — Z860100 Personal history of colon polyps, unspecified: Secondary | ICD-10-CM

## 2020-04-16 DIAGNOSIS — F17210 Nicotine dependence, cigarettes, uncomplicated: Secondary | ICD-10-CM | POA: Diagnosis not present

## 2020-04-16 DIAGNOSIS — J449 Chronic obstructive pulmonary disease, unspecified: Secondary | ICD-10-CM | POA: Insufficient documentation

## 2020-04-16 DIAGNOSIS — E1122 Type 2 diabetes mellitus with diabetic chronic kidney disease: Secondary | ICD-10-CM | POA: Diagnosis not present

## 2020-04-16 DIAGNOSIS — K635 Polyp of colon: Secondary | ICD-10-CM | POA: Diagnosis not present

## 2020-04-16 DIAGNOSIS — Z992 Dependence on renal dialysis: Secondary | ICD-10-CM | POA: Diagnosis not present

## 2020-04-16 DIAGNOSIS — Z794 Long term (current) use of insulin: Secondary | ICD-10-CM | POA: Insufficient documentation

## 2020-04-16 DIAGNOSIS — Z1211 Encounter for screening for malignant neoplasm of colon: Secondary | ICD-10-CM | POA: Diagnosis not present

## 2020-04-16 DIAGNOSIS — Z79899 Other long term (current) drug therapy: Secondary | ICD-10-CM | POA: Diagnosis not present

## 2020-04-16 DIAGNOSIS — K7460 Unspecified cirrhosis of liver: Secondary | ICD-10-CM | POA: Diagnosis not present

## 2020-04-16 DIAGNOSIS — K640 First degree hemorrhoids: Secondary | ICD-10-CM | POA: Diagnosis not present

## 2020-04-16 DIAGNOSIS — N186 End stage renal disease: Secondary | ICD-10-CM | POA: Diagnosis not present

## 2020-04-16 DIAGNOSIS — Z09 Encounter for follow-up examination after completed treatment for conditions other than malignant neoplasm: Secondary | ICD-10-CM | POA: Diagnosis present

## 2020-04-16 DIAGNOSIS — D631 Anemia in chronic kidney disease: Secondary | ICD-10-CM | POA: Diagnosis not present

## 2020-04-16 DIAGNOSIS — K648 Other hemorrhoids: Secondary | ICD-10-CM | POA: Diagnosis not present

## 2020-04-16 HISTORY — PX: COLONOSCOPY WITH PROPOFOL: SHX5780

## 2020-04-16 LAB — GLUCOSE, CAPILLARY: Glucose-Capillary: 164 mg/dL — ABNORMAL HIGH (ref 70–99)

## 2020-04-16 SURGERY — COLONOSCOPY WITH PROPOFOL
Anesthesia: General

## 2020-04-16 MED ORDER — SODIUM CHLORIDE 0.9 % IV SOLN
INTRAVENOUS | Status: DC
  Administered 2020-04-16: 500 mL via INTRAVENOUS

## 2020-04-16 MED ORDER — PROPOFOL 500 MG/50ML IV EMUL
INTRAVENOUS | Status: AC
  Filled 2020-04-16: qty 50

## 2020-04-16 MED ORDER — FENTANYL CITRATE (PF) 100 MCG/2ML IJ SOLN
INTRAMUSCULAR | Status: DC | PRN
  Administered 2020-04-16: 50 ug via INTRAVENOUS

## 2020-04-16 MED ORDER — PROPOFOL 500 MG/50ML IV EMUL
INTRAVENOUS | Status: DC | PRN
  Administered 2020-04-16: 150 ug/kg/min via INTRAVENOUS

## 2020-04-16 MED ORDER — EPHEDRINE SULFATE 50 MG/ML IJ SOLN
INTRAMUSCULAR | Status: DC | PRN
  Administered 2020-04-16: 10 mg via INTRAVENOUS

## 2020-04-16 MED ORDER — FENTANYL CITRATE (PF) 100 MCG/2ML IJ SOLN
INTRAMUSCULAR | Status: AC
  Filled 2020-04-16: qty 2

## 2020-04-16 MED ORDER — LIDOCAINE HCL (CARDIAC) PF 100 MG/5ML IV SOSY
PREFILLED_SYRINGE | INTRAVENOUS | Status: DC | PRN
  Administered 2020-04-16: 30 mg via INTRAVENOUS

## 2020-04-16 MED ORDER — MIDAZOLAM HCL 2 MG/2ML IJ SOLN
INTRAMUSCULAR | Status: AC
  Filled 2020-04-16: qty 2

## 2020-04-16 MED ORDER — EPHEDRINE 5 MG/ML INJ
INTRAVENOUS | Status: AC
  Filled 2020-04-16: qty 10

## 2020-04-16 NOTE — H&P (Signed)
Olivia Lame, MD [facility] [address], [place], [place] [address] [phone] Fax : [phone]  Primary Care Physician:  Olin Hauser, DO Primary Gastroenterologist:  Dr. Allen Norris  Pre-Procedure History & Physical: HPI:  [name] is a 54 y.o. female is here for an colonoscopy.   Past Medical History:  Diagnosis Date  . Allergy   . Anemia   . Anxiety    worse when not at home - bowel incontinence  . Arthritis   . Ataxia   . COPD (chronic obstructive pulmonary disease) ([place])   . Depression   . Diabetes mellitus with complication ([place])   . Dialysis patient ([place])   . ESRD on hemodialysis ([place])    MWF dialysis  . Fistula    left upper arm  . Hepatitis 2003   Hep C  . Hiatal hernia   . Hypercholesterolemia   . Hypertension   . Migraine   . Neuropathy, diabetic (HCC)    lower legs  . Non-alcoholic cirrhosis ([place])   . Peripheral vascular disease ([place])   . Pneumonia 2015  . Psoriasis   . Renal insufficiency   . Tobacco dependence   . Wears dentures    full upper, partial lower    Past Surgical History:  Procedure Laterality Date  . A/V SHUNT INTERVENTION N/A 12/16/2017   Procedure: A/V SHUNT INTERVENTION;  Surgeon: Algernon Huxley, MD;  Location: [place] [facility];  Service: Cardiovascular;  Laterality: N/A;  . AQUEOUS SHUNT Left 12/20/2019   Procedure: AHMED TUBE SHUNT WITH TUTOPLAST AND AC WASHOUT LEFT DIABETIC;  Surgeon: Leandrew Koyanagi, MD;  Location: [place];  Service: Ophthalmology;  Laterality: Left;  Diabetic - insulin  . AV FISTULA PLACEMENT Left 11/2014  . CESAREAN SECTION    . CHOLECYSTECTOMY    . COLONOSCOPY N/A 09/22/2017   Procedure: COLONOSCOPY;  Surgeon: Lin Landsman, MD;  Location: [place];  Service: Endoscopy;  Laterality: N/A;  . cyst removed  from left hand Left 1989  . DIALYSIS/PERMA CATHETER INSERTION N/A 05/20/2017   Procedure: Dialysis/Perma Catheter Insertion and fistulagram/LUE  angiogram;  Surgeon: Algernon Huxley, MD;  Location: [place] [facility];  Service: Cardiovascular;  Laterality: N/A;  . ESOPHAGOGASTRODUODENOSCOPY N/A 09/22/2017   Procedure: ESOPHAGOGASTRODUODENOSCOPY (EGD);  Surgeon: Lin Landsman, MD;  Location: [place];  Service: Endoscopy;  Laterality: N/A;  . INCISION AND DRAINAGE ABSCESS N/A 07/16/2016   Procedure: INCISION AND DRAINAGE ABSCESS;  Surgeon: Carloyn Manner, MD;  Location: [facility];  Service: ENT;  Laterality: N/A;  . PERIPHERAL VASCULAR CATHETERIZATION N/A 07/25/2015   Procedure: A/V Shuntogram/Fistulagram;  Surgeon: Algernon Huxley, MD;  Location: [place] [facility];  Service: Cardiovascular;  Laterality: N/A;  . PERIPHERAL VASCULAR CATHETERIZATION Left 07/25/2015   Procedure: A/V Shunt Intervention;  Surgeon: Algernon Huxley, MD;  Location: [place] [facility];  Service: Cardiovascular;  Laterality: Left;  . PERIPHERAL VASCULAR CATHETERIZATION Left 10/07/2015   Procedure: A/V Shuntogram/Fistulagram;  Surgeon: Algernon Huxley, MD;  Location: [place] [facility];  Service: Cardiovascular;  Laterality: Left;  . PERIPHERAL VASCULAR CATHETERIZATION N/A 10/07/2015   Procedure: A/V Shunt Intervention;  Surgeon: Algernon Huxley, MD;  Location: [place] [facility];  Service: Cardiovascular;  Laterality: N/A;  . PERIPHERAL VASCULAR CATHETERIZATION  10/07/2015   Procedure: Dialysis/Perma Catheter Insertion;  Surgeon: Algernon Huxley, MD;  Location: [place] [facility];  Service: Cardiovascular;;  . PERIPHERAL VASCULAR CATHETERIZATION N/A 12/17/2015   Procedure: Dialysis/Perma  Catheter Removal;  Surgeon: Katha Cabal, MD;  Location: [place] [facility];  Service: Cardiovascular;  Laterality: N/A;  . PERIPHERAL VASCULAR CATHETERIZATION N/A 01/11/2017   Procedure: Visceral Angiography;  Surgeon: Algernon Huxley, MD;  Location: [place] [facility];  Service: Cardiovascular;  Laterality: N/A;  . PERIPHERAL VASCULAR CATHETERIZATION N/A 01/11/2017    Procedure: Visceral Artery Intervention;  Surgeon: Algernon Huxley, MD;  Location: [place] [facility];  Service: Cardiovascular;  Laterality: N/A;  . POLYPECTOMY  09/22/2017   Procedure: POLYPECTOMY;  Surgeon: Lin Landsman, MD;  Location: [place];  Service: Endoscopy;;  . rt. tubal and ovary removed    . STENT PLACEMENT VASCULAR ([place] HX) Left 03/2018   Performed at [facility] using EV3 protg GPS stent graph [id number] lot [id number]  . TONSILLECTOMY    . TUBAL LIGATION    . UPPER EXTREMITY ANGIOGRAPHY Left 09/29/2019   Procedure: UPPER EXTREMITY ANGIOGRAPHY;  Surgeon: Katha Cabal, MD;  Location: [place] [facility];  Service: Cardiovascular;  Laterality: Left;    Prior to Admission medications   Medication Sig Start Date End Date Taking? Authorizing Provider  Adalimumab (HUMIRA) 40 MG/0.4ML PSKT Inject 40 mg into the skin every 14 (fourteen) days.   Yes [provider]  amitriptyline (ELAVIL) 25 MG tablet Take 25 mg by mouth at bedtime. 04/27/19  Yes [provider]  atorvastatin (LIPITOR) 20 MG tablet Take 1 tablet (20 mg total) by mouth at bedtime. 12/25/19  Yes Karamalegos, [name], DO  atropine 1 % ophthalmic solution Place 1 drop into the left eye 2 (two) times daily. 02/19/20  Yes [provider]  brimonidine (ALPHAGAN) 0.2 % ophthalmic solution Place 1 drop into the left eye 3 (three) times daily.   Yes [provider]  calcium acetate (PHOSLO) 667 MG capsule Take 1,334 mg by mouth 3 (three) times daily with meals.    Yes [provider]  cyclobenzaprine (FLEXERIL) 5 MG tablet Take 5 mg by mouth 2 (two) times daily as needed. 03/11/20  Yes [provider]  diclofenac Sodium (VOLTAREN) 1 % GEL Apply 2 g topically 4 (four) times daily. 01/19/20  Yes [provider]  diltiazem (CARDIZEM) 60 MG tablet Take 1 tablet (60 mg total) by mouth 3 (three) times daily. 01/22/20  Yes Theora Gianotti, NP  dorzolamidel-timolol (COSOPT) 22.3-6.8 MG/ML SOLN ophthalmic solution 1 drop 2 (two) times daily.   Yes [provider]  ferric citrate (AURYXIA) 1 GM 210 MG(Fe) tablet Take 420 mg by mouth 3 (three) times daily with meals.   Yes [provider]  glucose blood (TRUE METRIX BLOOD GLUCOSE TEST) test strip Use as instructed 01/11/18  Yes Mikey College, NP  insulin aspart (NOVOLOG) 100 UNIT/ML injection Inject 2 Units into the skin 3 (three) times daily before meals. 01/18/20  Yes Karamalegos, [name], DO  ketoconazole (NIZORAL) 2 % cream APPLY TOPICALLY ONTO THE SKIN (BOTH FEET) TWICE A DAY AS NEEDED 02/05/20  Yes Karamalegos, [name], DO  LANTUS 100 UNIT/ML injection INJECT 10 UNITS SUBCUTANEOUSLY ONCE A DAY 01/18/20  Yes Karamalegos, [name], DO  levocetirizine (XYZAL) 5 MG tablet TAKE ONE TABLET BY MOUTH EVERY EVENING 02/18/20  Yes Karamalegos, Alexander J, DO  Na Sulfate-K Sulfate-Mg Sulf (SUPREP BOWEL PREP KIT) 17.5-3.13-1.6 GM/177ML SOLN Take 1 kit by mouth as directed. 03/26/20  Yes Olivia Lame, MD  omeprazole (PRILOSEC) 40 MG capsule Take 40 mg by mouth daily. 04/27/19  Yes [provider]  ondansetron (ZOFRAN) 4 MG tablet Take 4 mg by mouth 2 (two) times daily as needed. 02/19/20  Yes [provider]  oxyCODONE (OXY IR/ROXICODONE) 5 MG immediate release tablet Take 1 tablet (5 mg total) by mouth 2 (two) times daily as needed for severe pain. Must last 30 days. 03/22/20 04/21/20 Yes Gillis Santa, MD  oxyCODONE (OXY IR/ROXICODONE) 5 MG immediate release tablet Take 1 tablet (5 mg total) by mouth 2 (two) times daily as needed for severe pain. Must last 30 days. 04/21/20 05/21/20 Yes Gillis Santa, MD  oxyCODONE (OXY IR/ROXICODONE) 5 MG immediate release tablet Take 1 tablet (5 mg total) by mouth 2 (two) times daily as needed for severe pain. Must last 30 days. 05/21/20 06/20/20 Yes Gillis Santa, MD  prednisoLONE acetate (PRED FORTE) 1 %  ophthalmic suspension  01/19/20  Yes [provider]  SYMBICORT 160-4.5 MCG/ACT inhaler SMARTSIG:2 Puff(s) By Mouth Twice Daily 01/22/20  Yes [provider]  torsemide (DEMADEX) 100 MG tablet Take 100 mg by mouth daily.   Yes [provider]  TRUEPLUS INSULIN SYRINGE 29G X 1/2" 0.5 ML MISC USE 4 TIMES A DAY WITH LANTUS & NOVOLOG 04/14/20  Yes Karamalegos, Alexander J, DO  VENTOLIN HFA 108 (90 Base) MCG/ACT inhaler Inhale 2 puffs into the lungs 4 (four) times daily as needed for wheezing or shortness of breath.  04/27/19  Yes [provider]    Allergies as of 03/28/2020 - Review Complete 03/26/2020  Allergen Reaction Noted  . Cinnamon Anaphylaxis 10/11/2015  . Garlic Anaphylaxis and Hives 11/28/2014  . Onion Hives and Swelling 11/28/2014  . Tylenol [acetaminophen] Anaphylaxis 07/25/2015  . Ciprofloxacin Diarrhea 11/05/2016  . Prednisone Other (See Comments) 01/05/2017    Family History  Problem Relation Age of Onset  . Hypertension Mother   . Heart disease Mother   . Hypertension Father   . Skin cancer Father     Social History   Socioeconomic History  . Marital status: Legally Separated    Spouse name: Not on file  . Number of children: Not on file  . Years of education: Not on file  . Highest education level: Not on file  Occupational History  . Occupation: disabled  Tobacco Use  . Smoking status: Light Tobacco Smoker    Packs/day: 0.25    Years: 20.00    Pack years: 5.00    Types: Cigarettes    Last attempt to quit: 05/21/2017    Years since quitting: 2.9  . Smokeless tobacco: Never Used  Substance and Sexual Activity  . Alcohol use: No  . Drug use: No  . Sexual activity: Not on file  Other Topics Concern  . Not on file  Social History Narrative   Patient lives at an extended stay hotel currently.     Social Determinants of Health   Financial Resource Strain:   . Difficulty of Paying Living Expenses:   Food Insecurity:   .  Worried About Charity fundraiser in the Last Year:   . Arboriculturist in the Last Year:   Transportation Needs:   . Film/video editor (Medical):   Marland Kitchen Lack of Transportation (Non-Medical):   Physical Activity:   . Days of Exercise per Week:   . Minutes of Exercise per Session:   Stress:   . Feeling of Stress :   Social Connections:   . Frequency of Communication with Friends and Family:   . Frequency of Social  Gatherings with Friends and Family:   . Attends Religious Services:   . Active Member of Clubs or Organizations:   . Attends Archivist Meetings:   Marland Kitchen Marital Status:   Intimate Partner Violence:   . Fear of Current or Ex-Partner:   . Emotionally Abused:   Marland Kitchen Physically Abused:   . Sexually Abused:     Review of Systems: See HPI, otherwise negative ROS  Physical Exam: BP (!) 151/73   Pulse 73   Temp (!) 96.3 F (35.7 C) (Temporal)   Resp 18   Ht '5\' 7"'$  (1.702 m)   Wt 104.3 kg   SpO2 100%   BMI 36.02 kg/m  General:   Alert,  pleasant and cooperative in NAD Head:  Normocephalic and atraumatic. Neck:  Supple; no masses or thyromegaly. Lungs:  Clear throughout to auscultation.    Heart:  Regular rate and rhythm. Abdomen:  Soft, nontender and nondistended. Normal bowel sounds, without guarding, and without rebound.   Neurologic:  Alert and  oriented x4;  grossly normal neurologically.  Impression/Plan: [name] is here for an colonoscopy to be performed for adenomatous polyps last colonoscopy 09/22/2017  Risks, benefits, limitations, and alternatives regarding  colonoscopy have been reviewed with the patient.  Questions have been answered.  All parties agreeable.   Olivia Lame, MD  04/16/2020, 10:47 AM

## 2020-04-16 NOTE — Anesthesia Postprocedure Evaluation (Signed)
Anesthesia Post Note  Patient: [name]  Procedure(s) Performed: COLONOSCOPY WITH PROPOFOL (N/A )  Patient location during evaluation: Endoscopy Anesthesia Type: General Level of consciousness: awake and alert and oriented Pain management: pain level controlled Vital Signs Assessment: post-procedure vital signs reviewed and stable Respiratory status: spontaneous breathing, nonlabored ventilation and respiratory function stable Cardiovascular status: blood pressure returned to baseline and stable Postop Assessment: no signs of nausea or vomiting Anesthetic complications: no     Last Vitals:  Vitals:   04/16/20 1012 04/16/20 1200  BP: (!) 151/73 (!) 114/34  Pulse: 73 63  Resp: 18 16  Temp: (!) 35.7 C (!) 36.1 C  SpO2: 100% 96%    Last Pain:  Vitals:   04/16/20 1200  TempSrc: Temporal  PainSc: Asleep                 Ledger Heindl

## 2020-04-16 NOTE — Anesthesia Preprocedure Evaluation (Signed)
Anesthesia Evaluation  Patient identified by MRN, date of birth, ID band Patient awake    Reviewed: Allergy & Precautions, NPO status , Patient's Chart, lab work & pertinent test results  History of Anesthesia Complications Negative for: history of anesthetic complications  Airway Mallampati: II  TM Distance: >3 FB Neck ROM: Full    Dental  (+) Upper Dentures, Partial Lower   Pulmonary COPD,  COPD inhaler, Current Smoker and Patient abstained from smoking.,    breath sounds clear to auscultation- rhonchi (-) wheezing      Cardiovascular hypertension, Pt. on medications (-) CAD, (-) Past MI, (-) Cardiac Stents and (-) CABG  Rhythm:Regular Rate:Normal - Systolic murmurs and - Diastolic murmurs    Neuro/Psych  Headaches, neg Seizures PSYCHIATRIC DISORDERS Anxiety Depression    GI/Hepatic hiatal hernia, (+) Hepatitis -, C  Endo/Other  diabetes, Insulin Dependent  Renal/GU ESRF and DialysisRenal disease     Musculoskeletal  (+) Arthritis ,   Abdominal (+) + obese,   Peds  Hematology  (+) anemia ,   Anesthesia Other Findings Past Medical History: No date: Allergy No date: Anemia No date: Anxiety     Comment:  worse when not at home - bowel incontinence No date: Arthritis No date: Ataxia No date: COPD (chronic obstructive pulmonary disease) (HCC) No date: Depression No date: Diabetes mellitus with complication (HCC) No date: Dialysis patient [facility]) No date: ESRD on hemodialysis ([place])     Comment:  MWF dialysis No date: Fistula     Comment:  left upper arm 2003: Hepatitis     Comment:  Hep C No date: Hiatal hernia No date: Hypercholesterolemia No date: Hypertension No date: Migraine No date: Neuropathy, diabetic ([place])     Comment:  lower legs No date: Non-alcoholic cirrhosis ([place]) No date: Peripheral vascular disease ([place]) 2015: Pneumonia No date: Psoriasis No date: Renal insufficiency No date: Tobacco  dependence No date: Wears dentures     Comment:  full upper, partial lower   Reproductive/Obstetrics                             Anesthesia Physical Anesthesia Plan  ASA: IV  Anesthesia Plan: General   Post-op Pain Management:    Induction: Intravenous  PONV Risk Score and Plan: 1 and Propofol infusion  Airway Management Planned: Natural Airway  Additional Equipment:   Intra-op Plan:   Post-operative Plan:   Informed Consent: I have reviewed the patients History and Physical, chart, labs and discussed the procedure including the risks, benefits and alternatives for the proposed anesthesia with the patient or authorized representative who has indicated his/her understanding and acceptance.     Dental advisory given  Plan Discussed with: CRNA and Anesthesiologist  Anesthesia Plan Comments:         Anesthesia Quick Evaluation

## 2020-04-16 NOTE — Op Note (Signed)
[facility] Gastroenterology Patient Name: [name] Procedure Date: 04/16/2020 11:25 AM MRN: [id number] Account #: [id number] Date of Birth: 05-18-1966 Admit Type: Outpatient Age: 54 Room: [facility] ENDO ROOM 4 Gender: Female Note Status: Finalized Procedure:             Colonoscopy Indications:           High risk colon cancer surveillance: Personal history                         of colonic polyps Providers:             [name] MD, MD Referring MD:          Olin Hauser (Referring MD) Medicines:             Propofol per Anesthesia Complications:         No immediate complications. Procedure:             Pre-Anesthesia Assessment:                        - Prior to the procedure, a History and Physical was                         performed, and patient medications and allergies were                         reviewed. The patient's tolerance of previous                         anesthesia was also reviewed. The risks and benefits                         of the procedure and the sedation options and risks                         were discussed with the patient. All questions were                         answered, and informed consent was obtained. Prior                         Anticoagulants: The patient has taken no previous                         anticoagulant or antiplatelet agents. ASA Grade                         Assessment: II - A patient with mild systemic disease.                         After reviewing the risks and benefits, the patient                         was deemed in satisfactory condition to undergo the                         procedure.  After obtaining informed consent, the colonoscope was                         passed under direct vision. Throughout the procedure,                         the patient's blood pressure, pulse, and oxygen                         saturations were monitored continuously. The                     Colonoscope was introduced through the anus and                         advanced to the the cecum, identified by appendiceal                         orifice and ileocecal valve. The colonoscopy was                         performed without difficulty. The patient tolerated                         the procedure well. The quality of the bowel                         preparation was fair. Findings:      The perianal and digital rectal examinations were normal.      Five sessile polyps were found in the transverse colon. The polyps were       3 to 5 mm in size. These polyps were removed with a cold snare.       Resection and retrieval were complete.      A 4 mm polyp was found in the sigmoid colon. The polyp was sessile. The       polyp was removed with a cold snare. Resection and retrieval were       complete.      Multiple small-mouthed diverticula were found in the sigmoid colon.      Non-bleeding internal hemorrhoids were found during retroflexion. The       hemorrhoids were Grade I (internal hemorrhoids that do not prolapse). Impression:            - Preparation of the colon was fair.                        - Five 3 to 5 mm polyps in the transverse colon,                         removed with a cold snare. Resected and retrieved.                        - One 4 mm polyp in the sigmoid colon, removed with a                         cold snare. Resected and retrieved.                        - Diverticulosis  in the sigmoid colon.                        - Non-bleeding internal hemorrhoids. Recommendation:        - Discharge patient to home.                        - Resume previous diet.                        - Continue present medications.                        - Await pathology results.                        - Repeat colonoscopy in 5 years for surveillance. Procedure Code(s):     --- Professional ---                        [phone], Colonoscopy, flexible; with removal of                          tumor(s), polyp(s), or other lesion(s) by snare                         technique Diagnosis Code(s):     --- Professional ---                        Z86.010, Personal history of colonic polyps                        K63.5, Polyp of colon CPT copyright 2019 American Medical Association. All rights reserved. The codes documented in this report are preliminary and upon coder review may  be revised to meet current compliance requirements. [name] MD, MD 04/16/2020 12:00:27 PM This report has been signed electronically. Number of Addenda: 0 Note Initiated On: 04/16/2020 11:25 AM Scope Withdrawal Time: 0 hours 13 minutes 15 seconds  Total Procedure Duration: 0 hours 20 minutes 10 seconds  Estimated Blood Loss:  Estimated blood loss: none.      [facility]

## 2020-04-16 NOTE — Transfer of Care (Signed)
Immediate Anesthesia Transfer of Care Note  Patient: [name]  Procedure(s) Performed: COLONOSCOPY WITH PROPOFOL (N/A )  Patient Location: PACU  Anesthesia Type:General  Level of Consciousness: awake and sedated  Airway & Oxygen Therapy: Patient Spontanous Breathing and Patient connected to nasal cannula oxygen  Post-op Assessment: Report given to RN and Post -op Vital signs reviewed and stable  Post vital signs: Reviewed and stable  Last Vitals:  Vitals Value Taken Time  BP    Temp    Pulse    Resp    SpO2      Last Pain:  Vitals:   04/16/20 1012  TempSrc: Temporal  PainSc: 0-No pain         Complications: No apparent anesthesia complications

## 2020-04-17 ENCOUNTER — Other Ambulatory Visit: Payer: Self-pay | Admitting: Family Medicine

## 2020-04-17 ENCOUNTER — Encounter: Payer: Self-pay | Admitting: *Deleted

## 2020-04-17 DIAGNOSIS — Z992 Dependence on renal dialysis: Secondary | ICD-10-CM | POA: Diagnosis not present

## 2020-04-17 DIAGNOSIS — D631 Anemia in chronic kidney disease: Secondary | ICD-10-CM | POA: Diagnosis not present

## 2020-04-17 DIAGNOSIS — D509 Iron deficiency anemia, unspecified: Secondary | ICD-10-CM | POA: Diagnosis not present

## 2020-04-17 DIAGNOSIS — N2581 Secondary hyperparathyroidism of renal origin: Secondary | ICD-10-CM | POA: Diagnosis not present

## 2020-04-17 DIAGNOSIS — E1165 Type 2 diabetes mellitus with hyperglycemia: Secondary | ICD-10-CM

## 2020-04-17 DIAGNOSIS — N186 End stage renal disease: Secondary | ICD-10-CM | POA: Diagnosis not present

## 2020-04-17 DIAGNOSIS — IMO0002 Reserved for concepts with insufficient information to code with codable children: Secondary | ICD-10-CM

## 2020-04-17 LAB — SURGICAL PATHOLOGY

## 2020-04-17 NOTE — Telephone Encounter (Signed)
Requested Prescriptions  Pending Prescriptions Disp Refills  . LANTUS 100 UNIT/ML injection [Pharmacy Med Name: Lantus U-100 Insulin 100 unit/mL subcutaneous solution] 10 mL 2    Sig: INJECT [place] A DAY     Endocrinology:  Diabetes - Insulins Failed - 04/17/2020  8:01 AM      Failed - HBA1C is between 0 and 7.9 and within 180 days    Hemoglobin A1C  Date Value Ref Range Status  01/15/2020 8.8  Final    Comment:    [facility]         Passed - Valid encounter within last 6 months    Recent Outpatient Visits          2 months ago Type 2 diabetes mellitus with ESRD (end-stage renal disease) ([place])   [facility] Olin Hauser, DO   10 months ago Type 2 diabetes mellitus with ESRD (end-stage renal disease) ([place])   [facility] Olin Hauser, DO   11 months ago General medical exam   [place], DO   1 year ago Tinea of groin   [facility] Merrilyn Puma, Jerrel Ivory, NP   1 year ago Diabetes mellitus type 2, uncontrolled, with complications [facility])   [facility] Merrilyn Puma, Jerrel Ivory, NP      Future Appointments            In 1 week [place], [name], DO [facility] [place]           . NOVOLOG 100 UNIT/ML injection [Pharmacy Med Name: Novolog U-100 Insulin aspart 100 unit/mL subcutaneous solution] 10 mL 2    Sig: INJECT 2 UNITS SUBCUTANEOUSLY 3 TIMES A DAY BEFORE MEALS     Endocrinology:  Diabetes - Insulins Failed - 04/17/2020  8:01 AM      Failed - HBA1C is between 0 and 7.9 and within 180 days    Hemoglobin A1C  Date Value Ref Range Status  01/15/2020 8.8  Final    Comment:    [facility]         Passed - Valid encounter within last 6 months    Recent Outpatient Visits          2 months ago Type 2 diabetes mellitus with ESRD (end-stage renal disease) ([place])   [place], DO   10 months ago Type 2 diabetes mellitus with ESRD (end-stage renal disease) ([place])   [facility] Olin Hauser, DO   11 months ago General medical exam   [place], DO   1 year ago Tinea of groin   [facility] Merrilyn Puma, Jerrel Ivory, NP   1 year ago Diabetes mellitus type 2, uncontrolled, with complications [facility])   [facility] Merrilyn Puma, Jerrel Ivory, NP      Future Appointments            In 1 week Parks Ranger, [name], DO [facility], [facility]

## 2020-04-19 ENCOUNTER — Other Ambulatory Visit
Admission: RE | Admit: 2020-04-19 | Discharge: 2020-04-19 | Disposition: A | Discharge status: Home / Self Care | Payer: Medicare Other | Source: Ambulatory Visit | Attending: Vascular Surgery | Admitting: Vascular Surgery

## 2020-04-19 ENCOUNTER — Other Ambulatory Visit: Payer: Self-pay

## 2020-04-19 DIAGNOSIS — D631 Anemia in chronic kidney disease: Secondary | ICD-10-CM | POA: Diagnosis not present

## 2020-04-19 DIAGNOSIS — Z01812 Encounter for preprocedural laboratory examination: Secondary | ICD-10-CM | POA: Insufficient documentation

## 2020-04-19 DIAGNOSIS — Z20822 Contact with and (suspected) exposure to covid-19: Secondary | ICD-10-CM | POA: Insufficient documentation

## 2020-04-19 DIAGNOSIS — N186 End stage renal disease: Secondary | ICD-10-CM | POA: Diagnosis not present

## 2020-04-19 DIAGNOSIS — N2581 Secondary hyperparathyroidism of renal origin: Secondary | ICD-10-CM | POA: Diagnosis not present

## 2020-04-19 DIAGNOSIS — Z992 Dependence on renal dialysis: Secondary | ICD-10-CM | POA: Diagnosis not present

## 2020-04-19 DIAGNOSIS — D509 Iron deficiency anemia, unspecified: Secondary | ICD-10-CM | POA: Diagnosis not present

## 2020-04-19 LAB — SARS CORONAVIRUS 2 (TAT 6-24 HRS): SARS Coronavirus 2: NEGATIVE

## 2020-04-20 DIAGNOSIS — R1110 Vomiting, unspecified: Secondary | ICD-10-CM | POA: Diagnosis not present

## 2020-04-20 DIAGNOSIS — D631 Anemia in chronic kidney disease: Secondary | ICD-10-CM | POA: Diagnosis not present

## 2020-04-20 DIAGNOSIS — D509 Iron deficiency anemia, unspecified: Secondary | ICD-10-CM | POA: Diagnosis not present

## 2020-04-20 DIAGNOSIS — R110 Nausea: Secondary | ICD-10-CM | POA: Diagnosis not present

## 2020-04-20 DIAGNOSIS — N2581 Secondary hyperparathyroidism of renal origin: Secondary | ICD-10-CM | POA: Diagnosis not present

## 2020-04-20 DIAGNOSIS — N186 End stage renal disease: Secondary | ICD-10-CM | POA: Diagnosis not present

## 2020-04-20 DIAGNOSIS — Z992 Dependence on renal dialysis: Secondary | ICD-10-CM | POA: Diagnosis not present

## 2020-04-22 ENCOUNTER — Encounter: Payer: Self-pay | Admitting: Gastroenterology

## 2020-04-22 ENCOUNTER — Other Ambulatory Visit (INDEPENDENT_AMBULATORY_CARE_PROVIDER_SITE_OTHER): Payer: Self-pay | Admitting: Nurse Practitioner

## 2020-04-22 DIAGNOSIS — Z992 Dependence on renal dialysis: Secondary | ICD-10-CM | POA: Diagnosis not present

## 2020-04-22 DIAGNOSIS — N186 End stage renal disease: Secondary | ICD-10-CM | POA: Diagnosis not present

## 2020-04-22 DIAGNOSIS — D509 Iron deficiency anemia, unspecified: Secondary | ICD-10-CM | POA: Diagnosis not present

## 2020-04-22 DIAGNOSIS — N2581 Secondary hyperparathyroidism of renal origin: Secondary | ICD-10-CM | POA: Diagnosis not present

## 2020-04-22 DIAGNOSIS — R110 Nausea: Secondary | ICD-10-CM | POA: Diagnosis not present

## 2020-04-22 DIAGNOSIS — R1110 Vomiting, unspecified: Secondary | ICD-10-CM | POA: Diagnosis not present

## 2020-04-23 ENCOUNTER — Encounter
Admission: RE | Disposition: A | Discharge status: Home / Self Care | Payer: Self-pay | Source: Home / Self Care | Attending: Vascular Surgery

## 2020-04-23 ENCOUNTER — Other Ambulatory Visit: Payer: Self-pay

## 2020-04-23 ENCOUNTER — Ambulatory Visit
Admission: RE | Admit: 2020-04-23 | Discharge: 2020-04-23 | Disposition: A | Discharge status: Home / Self Care | Payer: Medicare Other | Attending: Vascular Surgery | Admitting: Vascular Surgery

## 2020-04-23 ENCOUNTER — Encounter: Payer: Self-pay | Admitting: Vascular Surgery

## 2020-04-23 DIAGNOSIS — Y841 Kidney dialysis as the cause of abnormal reaction of the patient, or of later complication, without mention of misadventure at the time of the procedure: Secondary | ICD-10-CM | POA: Diagnosis not present

## 2020-04-23 DIAGNOSIS — Z87891 Personal history of nicotine dependence: Secondary | ICD-10-CM | POA: Diagnosis not present

## 2020-04-23 DIAGNOSIS — E7800 Pure hypercholesterolemia, unspecified: Secondary | ICD-10-CM | POA: Diagnosis not present

## 2020-04-23 DIAGNOSIS — J449 Chronic obstructive pulmonary disease, unspecified: Secondary | ICD-10-CM | POA: Diagnosis not present

## 2020-04-23 DIAGNOSIS — I120 Hypertensive chronic kidney disease with stage 5 chronic kidney disease or end stage renal disease: Secondary | ICD-10-CM | POA: Diagnosis not present

## 2020-04-23 DIAGNOSIS — E1151 Type 2 diabetes mellitus with diabetic peripheral angiopathy without gangrene: Secondary | ICD-10-CM | POA: Diagnosis not present

## 2020-04-23 DIAGNOSIS — F419 Anxiety disorder, unspecified: Secondary | ICD-10-CM | POA: Diagnosis not present

## 2020-04-23 DIAGNOSIS — N186 End stage renal disease: Secondary | ICD-10-CM | POA: Diagnosis not present

## 2020-04-23 DIAGNOSIS — T82858S Stenosis of vascular prosthetic devices, implants and grafts, sequela: Secondary | ICD-10-CM | POA: Diagnosis not present

## 2020-04-23 DIAGNOSIS — T82898A Other specified complication of vascular prosthetic devices, implants and grafts, initial encounter: Secondary | ICD-10-CM | POA: Diagnosis not present

## 2020-04-23 DIAGNOSIS — E1122 Type 2 diabetes mellitus with diabetic chronic kidney disease: Secondary | ICD-10-CM | POA: Insufficient documentation

## 2020-04-23 DIAGNOSIS — Z992 Dependence on renal dialysis: Secondary | ICD-10-CM | POA: Diagnosis not present

## 2020-04-23 DIAGNOSIS — E1140 Type 2 diabetes mellitus with diabetic neuropathy, unspecified: Secondary | ICD-10-CM | POA: Insufficient documentation

## 2020-04-23 HISTORY — PX: A/V FISTULAGRAM: CATH118298

## 2020-04-23 LAB — GLUCOSE, CAPILLARY: Glucose-Capillary: 178 mg/dL — ABNORMAL HIGH (ref 70–99)

## 2020-04-23 LAB — POTASSIUM (ARMC VASCULAR LAB ONLY): Potassium (ARMC vascular lab): 5 (ref 3.5–5.1)

## 2020-04-23 SURGERY — A/V FISTULAGRAM
Anesthesia: Moderate Sedation | Laterality: Left

## 2020-04-23 MED ORDER — FENTANYL CITRATE (PF) 100 MCG/2ML IJ SOLN
INTRAMUSCULAR | Status: DC | PRN
  Administered 2020-04-23 (×3): 25 ug via INTRAVENOUS
  Administered 2020-04-23: 50 ug via INTRAVENOUS

## 2020-04-23 MED ORDER — FAMOTIDINE 20 MG PO TABS: 40.0000 mg | ORAL_TABLET | Freq: Once | ORAL | Status: DC | PRN

## 2020-04-23 MED ORDER — ONDANSETRON HCL 4 MG/2ML IJ SOLN: 4.0000 mg | Freq: Four times a day (QID) | INTRAMUSCULAR | Status: DC | PRN

## 2020-04-23 MED ORDER — IODIXANOL 320 MG/ML IV SOLN
INTRAVENOUS | Status: DC | PRN
  Administered 2020-04-23: 14:00:00 30 mL via INTRAVENOUS

## 2020-04-23 MED ORDER — METHYLPREDNISOLONE SODIUM SUCC 125 MG IJ SOLR: 125.0000 mg | Freq: Once | INTRAMUSCULAR | Status: DC | PRN

## 2020-04-23 MED ORDER — SODIUM CHLORIDE 0.9 % IV SOLN: INTRAVENOUS | Status: DC

## 2020-04-23 MED ORDER — HEPARIN SODIUM (PORCINE) 1000 UNIT/ML IJ SOLN
INTRAMUSCULAR | Status: DC | PRN
  Administered 2020-04-23: 3000 [IU] via INTRAVENOUS

## 2020-04-23 MED ORDER — FENTANYL CITRATE (PF) 100 MCG/2ML IJ SOLN
INTRAMUSCULAR | Status: AC
  Filled 2020-04-23: qty 2

## 2020-04-23 MED ORDER — LABETALOL HCL 5 MG/ML IV SOLN
INTRAVENOUS | Status: DC | PRN
  Administered 2020-04-23: 10 mg via INTRAVENOUS

## 2020-04-23 MED ORDER — HYDROMORPHONE HCL 1 MG/ML IJ SOLN: 1.0000 mg | Freq: Once | INTRAMUSCULAR | Status: DC | PRN

## 2020-04-23 MED ORDER — MIDAZOLAM HCL 2 MG/2ML IJ SOLN
INTRAMUSCULAR | Status: DC | PRN
  Administered 2020-04-23 (×3): 1 mg via INTRAVENOUS

## 2020-04-23 MED ORDER — DIPHENHYDRAMINE HCL 50 MG/ML IJ SOLN: 50.0000 mg | Freq: Once | INTRAMUSCULAR | Status: DC | PRN

## 2020-04-23 MED ORDER — HEPARIN SODIUM (PORCINE) 1000 UNIT/ML IJ SOLN
INTRAMUSCULAR | Status: AC
  Filled 2020-04-23: qty 1

## 2020-04-23 MED ORDER — LABETALOL HCL 5 MG/ML IV SOLN
INTRAVENOUS | Status: AC
  Filled 2020-04-23: qty 4

## 2020-04-23 MED ORDER — MIDAZOLAM HCL 5 MG/5ML IJ SOLN
INTRAMUSCULAR | Status: AC
  Filled 2020-04-23: qty 5

## 2020-04-23 MED ORDER — CEFAZOLIN SODIUM-DEXTROSE 1-4 GM/50ML-% IV SOLN
1.0000 g | Freq: Once | INTRAVENOUS | Status: AC
  Administered 2020-04-23: 1 g via INTRAVENOUS

## 2020-04-23 MED ORDER — MIDAZOLAM HCL 2 MG/ML PO SYRP: 8.0000 mg | ORAL_SOLUTION | Freq: Once | ORAL | Status: DC | PRN

## 2020-04-23 SURGICAL SUPPLY — 16 items
BALLN DORADO 10X40X80 (BALLOONS) ×3
BALLN DORADO 8X40X80 (BALLOONS) ×3
BALLN LUTONIX AV 8X60X75 (BALLOONS) ×3
BALLOON DORADO 10X40X80 (BALLOONS) ×1 IMPLANT
BALLOON DORADO 8X40X80 (BALLOONS) ×1 IMPLANT
BALLOON LUTONIX AV 8X60X75 (BALLOONS) ×1 IMPLANT
COVER PROBE U/S 5X48 (MISCELLANEOUS) ×3 IMPLANT
DEVICE PRESTO INFLATION (MISCELLANEOUS) ×3 IMPLANT
NEEDLE ENTRY 21GA 7CM ECHOTIP (NEEDLE) ×3 IMPLANT
PACK ANGIOGRAPHY (CUSTOM PROCEDURE TRAY) ×3 IMPLANT
SET INTRO CAPELLA COAXIAL (SET/KITS/TRAYS/PACK) ×3 IMPLANT
SHEATH BRITE TIP 6FRX5.5 (SHEATH) ×3 IMPLANT
SHEATH BRITE TIP 7FRX5.5 (SHEATH) ×3 IMPLANT
STENT VENOVO 14X40X80 (Permanent Stent) ×3 IMPLANT
STENT VENOVO 14X60X80 (Permanent Stent) IMPLANT
WIRE MAGIC TOR.035 180C (WIRE) ×3 IMPLANT

## 2020-04-23 NOTE — H&P (Signed)
[facility] History & Physical Update  The patient was interviewed and re-examined.  The patient's previous History and Physical has been reviewed and is unchanged.  There is no change in the plan of care. We plan to proceed with the scheduled procedure.  Hortencia Pilar, MD  04/23/2020, 12:45 PM

## 2020-04-23 NOTE — Discharge Instructions (Signed)

## 2020-04-23 NOTE — Op Note (Signed)
OPERATIVE NOTE   PROCEDURE: 1. Contrast injection left brachiocephalic AV access 2. Percutaneous transluminal angioplasty and stent placement peripheral segment to 10 mm 3. Percutaneous transluminal angioplasty central venous segment to 8 mm  PRE-OPERATIVE DIAGNOSIS: Complication of dialysis access                                                       End Stage Renal Disease  POST-OPERATIVE DIAGNOSIS: same as above   SURGEON: Katha Cabal, M.D.  ANESTHESIA: Conscious sedation was administered under my direct supervision by the interventional radiology RN. IV Versed plus fentanyl were utilized. Continuous ECG, pulse oximetry and blood pressure was monitored throughout the entire procedure.  Conscious sedation was for a total of 30.  ESTIMATED BLOOD LOSS: minimal  FINDING(S): Stricture of the AV graft within the peripheral segment as well as a stricture within the central venous portion  SPECIMEN(S):  None  CONTRAST: 30 cc  FLUOROSCOPY TIME: 2.8 minutes  INDICATIONS: CAIYA BETTES is a 54 y.o. female who  presents with malfunctioning left upper arm AV access.  The patient is scheduled for angiography with possible intervention of the AV access.  The patient is aware the risks include but are not limited to: bleeding, infection, thrombosis of the cannulated access, and possible anaphylactic reaction to the contrast.  The patient acknowledges if the access can not be salvaged a tunneled catheter will be needed and will be placed during this procedure.  The patient is aware of the risks of the procedure and elects to proceed with the angiogram and intervention.  DESCRIPTION: After full informed written consent was obtained, the patient was brought back to the Special Procedure suite and placed supine position.  Appropriate cardiopulmonary monitors were placed.  The left arm was prepped and draped in the standard fashion.  Appropriate timeout is called. The left brachiocephalic  fistula was cannulated with a micropuncture needle.  Cannulation was performed with ultrasound guidance. Ultrasound was placed in a sterile sleeve, the AV access was interrogated and noted to be echolucent and compressible indicating patency. Image was recorded for the permanent record. The puncture is performed under continuous ultrasound visualization.   The microwire was advanced and the needle was exchanged for  a microsheath.  The J-wire was then advanced and a 6 Fr sheath inserted.  Hand injections were completed to image the access from the arterial anastomosis through the entire access.  The central venous structures were also imaged by hand injections.  Diagnostic interpretation: The fistula demonstrates 2 mild to moderate sized aneurysms with minimal thrombus.  These correlate with the arterial and venous cannulation sites.  Just proximal to these just below the level of the humeral head there is a greater than 80% stenosis in the cephalic vein.  At the level of the cephalic confluence there is a previously placed Viabahn stent.  There is in-stent restenosis of greater than 60% both at the leading edge of the stent as well as at the portion of the stent located in the innominate vein.  More proximal innominate vein the superior vena cava are widely patent.  Based on the images,  3000 units of heparin was given and a wire was negotiated through the strictures within the venous portion of the graft as well as the central stenosis. The sheath was then upsized to  a 7 [place] sheath.  An 8 mm x 60 mm Lutonix drug-eluting balloon was used.  Inflation was to 12 atm for 1 minute.  Next a 12 mm x 40 mm the VeNovo stent was deployed across the lesion and then postdilated to 10 atm with a 10 x 40 [place] inflated to 20 atm for approximately 30 seconds.  The detector was then repositioned over the central portion of the AV access an 8 mm x 40 mm [place] balloon was used to treat the stricture within the central  portion.  2 separate inflations were required one at the leading edge of the stent and 1 in the anatomy at the outflow of the stent, inflations were to 20 atm for 1 minute.  Follow-up imaging demonstrates significant improvement with a marked reduction of the strictures, there was now less than 10% residual stenosis in the central strictures.  In the stented segment of the periphery there is less than 10% residual stenosis.  There is now rapid flow of contrast through the graft and the central veins.   A 4-0 Monocryl purse-string suture was sewn around the sheath.  The sheath was removed and light pressure was applied.  A sterile bandage was applied to the puncture site.    COMPLICATIONS: None  CONDITION: Carlynn Purl, M.D [facility] Office: [phone]  04/23/2020 1:53 PM

## 2020-04-24 ENCOUNTER — Encounter: Payer: Self-pay | Admitting: Cardiology

## 2020-04-24 DIAGNOSIS — R1110 Vomiting, unspecified: Secondary | ICD-10-CM | POA: Diagnosis not present

## 2020-04-24 DIAGNOSIS — N2581 Secondary hyperparathyroidism of renal origin: Secondary | ICD-10-CM | POA: Diagnosis not present

## 2020-04-24 DIAGNOSIS — Z992 Dependence on renal dialysis: Secondary | ICD-10-CM | POA: Diagnosis not present

## 2020-04-24 DIAGNOSIS — D509 Iron deficiency anemia, unspecified: Secondary | ICD-10-CM | POA: Diagnosis not present

## 2020-04-24 DIAGNOSIS — N186 End stage renal disease: Secondary | ICD-10-CM | POA: Diagnosis not present

## 2020-04-24 DIAGNOSIS — R110 Nausea: Secondary | ICD-10-CM | POA: Diagnosis not present

## 2020-04-26 DIAGNOSIS — Z992 Dependence on renal dialysis: Secondary | ICD-10-CM | POA: Diagnosis not present

## 2020-04-26 DIAGNOSIS — D509 Iron deficiency anemia, unspecified: Secondary | ICD-10-CM | POA: Diagnosis not present

## 2020-04-26 DIAGNOSIS — R1110 Vomiting, unspecified: Secondary | ICD-10-CM | POA: Diagnosis not present

## 2020-04-26 DIAGNOSIS — N186 End stage renal disease: Secondary | ICD-10-CM | POA: Diagnosis not present

## 2020-04-26 DIAGNOSIS — N2581 Secondary hyperparathyroidism of renal origin: Secondary | ICD-10-CM | POA: Diagnosis not present

## 2020-04-26 DIAGNOSIS — R110 Nausea: Secondary | ICD-10-CM | POA: Diagnosis not present

## 2020-04-29 ENCOUNTER — Ambulatory Visit: Payer: Medicare Other | Admitting: Family Medicine

## 2020-04-29 DIAGNOSIS — R110 Nausea: Secondary | ICD-10-CM | POA: Diagnosis not present

## 2020-04-29 DIAGNOSIS — N2581 Secondary hyperparathyroidism of renal origin: Secondary | ICD-10-CM | POA: Diagnosis not present

## 2020-04-29 DIAGNOSIS — D509 Iron deficiency anemia, unspecified: Secondary | ICD-10-CM | POA: Diagnosis not present

## 2020-04-29 DIAGNOSIS — Z992 Dependence on renal dialysis: Secondary | ICD-10-CM | POA: Diagnosis not present

## 2020-04-29 DIAGNOSIS — R1110 Vomiting, unspecified: Secondary | ICD-10-CM | POA: Diagnosis not present

## 2020-04-29 DIAGNOSIS — N186 End stage renal disease: Secondary | ICD-10-CM | POA: Diagnosis not present

## 2020-04-30 DIAGNOSIS — E113512 Type 2 diabetes mellitus with proliferative diabetic retinopathy with macular edema, left eye: Secondary | ICD-10-CM | POA: Diagnosis not present

## 2020-04-30 DIAGNOSIS — H4052X0 Glaucoma secondary to other eye disorders, left eye, stage unspecified: Secondary | ICD-10-CM | POA: Diagnosis not present

## 2020-05-01 DIAGNOSIS — Z992 Dependence on renal dialysis: Secondary | ICD-10-CM | POA: Diagnosis not present

## 2020-05-01 DIAGNOSIS — D509 Iron deficiency anemia, unspecified: Secondary | ICD-10-CM | POA: Diagnosis not present

## 2020-05-01 DIAGNOSIS — R110 Nausea: Secondary | ICD-10-CM | POA: Diagnosis not present

## 2020-05-01 DIAGNOSIS — N2581 Secondary hyperparathyroidism of renal origin: Secondary | ICD-10-CM | POA: Diagnosis not present

## 2020-05-01 DIAGNOSIS — N186 End stage renal disease: Secondary | ICD-10-CM | POA: Diagnosis not present

## 2020-05-01 DIAGNOSIS — R1110 Vomiting, unspecified: Secondary | ICD-10-CM | POA: Diagnosis not present

## 2020-05-03 DIAGNOSIS — N186 End stage renal disease: Secondary | ICD-10-CM | POA: Diagnosis not present

## 2020-05-03 DIAGNOSIS — Z992 Dependence on renal dialysis: Secondary | ICD-10-CM | POA: Diagnosis not present

## 2020-05-03 DIAGNOSIS — D509 Iron deficiency anemia, unspecified: Secondary | ICD-10-CM | POA: Diagnosis not present

## 2020-05-03 DIAGNOSIS — N2581 Secondary hyperparathyroidism of renal origin: Secondary | ICD-10-CM | POA: Diagnosis not present

## 2020-05-03 DIAGNOSIS — R110 Nausea: Secondary | ICD-10-CM | POA: Diagnosis not present

## 2020-05-03 DIAGNOSIS — R1110 Vomiting, unspecified: Secondary | ICD-10-CM | POA: Diagnosis not present

## 2020-05-06 DIAGNOSIS — D509 Iron deficiency anemia, unspecified: Secondary | ICD-10-CM | POA: Diagnosis not present

## 2020-05-06 DIAGNOSIS — R110 Nausea: Secondary | ICD-10-CM | POA: Diagnosis not present

## 2020-05-06 DIAGNOSIS — R1110 Vomiting, unspecified: Secondary | ICD-10-CM | POA: Diagnosis not present

## 2020-05-06 DIAGNOSIS — N186 End stage renal disease: Secondary | ICD-10-CM | POA: Diagnosis not present

## 2020-05-06 DIAGNOSIS — N2581 Secondary hyperparathyroidism of renal origin: Secondary | ICD-10-CM | POA: Diagnosis not present

## 2020-05-06 DIAGNOSIS — Z992 Dependence on renal dialysis: Secondary | ICD-10-CM | POA: Diagnosis not present

## 2020-05-07 ENCOUNTER — Other Ambulatory Visit (INDEPENDENT_AMBULATORY_CARE_PROVIDER_SITE_OTHER): Payer: Self-pay | Admitting: Vascular Surgery

## 2020-05-07 DIAGNOSIS — N186 End stage renal disease: Secondary | ICD-10-CM

## 2020-05-07 DIAGNOSIS — Z95820 Peripheral vascular angioplasty status with implants and grafts: Secondary | ICD-10-CM

## 2020-05-07 DIAGNOSIS — T829XXS Unspecified complication of cardiac and vascular prosthetic device, implant and graft, sequela: Secondary | ICD-10-CM

## 2020-05-08 DIAGNOSIS — R110 Nausea: Secondary | ICD-10-CM | POA: Diagnosis not present

## 2020-05-08 DIAGNOSIS — D509 Iron deficiency anemia, unspecified: Secondary | ICD-10-CM | POA: Diagnosis not present

## 2020-05-08 DIAGNOSIS — N186 End stage renal disease: Secondary | ICD-10-CM | POA: Diagnosis not present

## 2020-05-08 DIAGNOSIS — N2581 Secondary hyperparathyroidism of renal origin: Secondary | ICD-10-CM | POA: Diagnosis not present

## 2020-05-08 DIAGNOSIS — R1110 Vomiting, unspecified: Secondary | ICD-10-CM | POA: Diagnosis not present

## 2020-05-08 DIAGNOSIS — Z992 Dependence on renal dialysis: Secondary | ICD-10-CM | POA: Diagnosis not present

## 2020-05-09 ENCOUNTER — Ambulatory Visit (INDEPENDENT_AMBULATORY_CARE_PROVIDER_SITE_OTHER): Payer: Medicare Other | Admitting: Nurse Practitioner

## 2020-05-09 ENCOUNTER — Encounter (INDEPENDENT_AMBULATORY_CARE_PROVIDER_SITE_OTHER): Payer: Medicare Other

## 2020-05-09 ENCOUNTER — Encounter: Payer: Self-pay | Admitting: Gastroenterology

## 2020-05-10 DIAGNOSIS — D509 Iron deficiency anemia, unspecified: Secondary | ICD-10-CM | POA: Diagnosis not present

## 2020-05-10 DIAGNOSIS — R110 Nausea: Secondary | ICD-10-CM | POA: Diagnosis not present

## 2020-05-10 DIAGNOSIS — N186 End stage renal disease: Secondary | ICD-10-CM | POA: Diagnosis not present

## 2020-05-10 DIAGNOSIS — N2581 Secondary hyperparathyroidism of renal origin: Secondary | ICD-10-CM | POA: Diagnosis not present

## 2020-05-10 DIAGNOSIS — Z992 Dependence on renal dialysis: Secondary | ICD-10-CM | POA: Diagnosis not present

## 2020-05-10 DIAGNOSIS — R1110 Vomiting, unspecified: Secondary | ICD-10-CM | POA: Diagnosis not present

## 2020-05-13 DIAGNOSIS — N186 End stage renal disease: Secondary | ICD-10-CM | POA: Diagnosis not present

## 2020-05-13 DIAGNOSIS — R110 Nausea: Secondary | ICD-10-CM | POA: Diagnosis not present

## 2020-05-13 DIAGNOSIS — R1110 Vomiting, unspecified: Secondary | ICD-10-CM | POA: Diagnosis not present

## 2020-05-13 DIAGNOSIS — D509 Iron deficiency anemia, unspecified: Secondary | ICD-10-CM | POA: Diagnosis not present

## 2020-05-13 DIAGNOSIS — N2581 Secondary hyperparathyroidism of renal origin: Secondary | ICD-10-CM | POA: Diagnosis not present

## 2020-05-13 DIAGNOSIS — Z992 Dependence on renal dialysis: Secondary | ICD-10-CM | POA: Diagnosis not present

## 2020-05-15 DIAGNOSIS — Z992 Dependence on renal dialysis: Secondary | ICD-10-CM | POA: Diagnosis not present

## 2020-05-15 DIAGNOSIS — N2581 Secondary hyperparathyroidism of renal origin: Secondary | ICD-10-CM | POA: Diagnosis not present

## 2020-05-15 DIAGNOSIS — R1110 Vomiting, unspecified: Secondary | ICD-10-CM | POA: Diagnosis not present

## 2020-05-15 DIAGNOSIS — D509 Iron deficiency anemia, unspecified: Secondary | ICD-10-CM | POA: Diagnosis not present

## 2020-05-15 DIAGNOSIS — R110 Nausea: Secondary | ICD-10-CM | POA: Diagnosis not present

## 2020-05-15 DIAGNOSIS — N186 End stage renal disease: Secondary | ICD-10-CM | POA: Diagnosis not present

## 2020-05-17 DIAGNOSIS — R110 Nausea: Secondary | ICD-10-CM | POA: Diagnosis not present

## 2020-05-17 DIAGNOSIS — N186 End stage renal disease: Secondary | ICD-10-CM | POA: Diagnosis not present

## 2020-05-17 DIAGNOSIS — N2581 Secondary hyperparathyroidism of renal origin: Secondary | ICD-10-CM | POA: Diagnosis not present

## 2020-05-17 DIAGNOSIS — Z992 Dependence on renal dialysis: Secondary | ICD-10-CM | POA: Diagnosis not present

## 2020-05-17 DIAGNOSIS — R1110 Vomiting, unspecified: Secondary | ICD-10-CM | POA: Diagnosis not present

## 2020-05-17 DIAGNOSIS — D509 Iron deficiency anemia, unspecified: Secondary | ICD-10-CM | POA: Diagnosis not present

## 2020-05-20 DIAGNOSIS — Z992 Dependence on renal dialysis: Secondary | ICD-10-CM | POA: Diagnosis not present

## 2020-05-20 DIAGNOSIS — R110 Nausea: Secondary | ICD-10-CM | POA: Diagnosis not present

## 2020-05-20 DIAGNOSIS — D509 Iron deficiency anemia, unspecified: Secondary | ICD-10-CM | POA: Diagnosis not present

## 2020-05-20 DIAGNOSIS — N2581 Secondary hyperparathyroidism of renal origin: Secondary | ICD-10-CM | POA: Diagnosis not present

## 2020-05-20 DIAGNOSIS — N186 End stage renal disease: Secondary | ICD-10-CM | POA: Diagnosis not present

## 2020-05-20 DIAGNOSIS — R1110 Vomiting, unspecified: Secondary | ICD-10-CM | POA: Diagnosis not present

## 2020-06-11 ENCOUNTER — Ambulatory Visit
Payer: Medicare Other | Attending: Student in an Organized Health Care Education/Training Program | Admitting: Student in an Organized Health Care Education/Training Program

## 2020-06-11 ENCOUNTER — Encounter: Payer: Self-pay | Admitting: Student in an Organized Health Care Education/Training Program

## 2020-06-11 ENCOUNTER — Other Ambulatory Visit: Payer: Self-pay

## 2020-06-11 DIAGNOSIS — E1142 Type 2 diabetes mellitus with diabetic polyneuropathy: Secondary | ICD-10-CM | POA: Diagnosis not present

## 2020-06-11 DIAGNOSIS — M5416 Radiculopathy, lumbar region: Secondary | ICD-10-CM

## 2020-06-11 DIAGNOSIS — M47812 Spondylosis without myelopathy or radiculopathy, cervical region: Secondary | ICD-10-CM

## 2020-06-11 DIAGNOSIS — M65342 Trigger finger, left ring finger: Secondary | ICD-10-CM | POA: Diagnosis not present

## 2020-06-11 DIAGNOSIS — M5030 Other cervical disc degeneration, unspecified cervical region: Secondary | ICD-10-CM | POA: Diagnosis not present

## 2020-06-11 DIAGNOSIS — G894 Chronic pain syndrome: Secondary | ICD-10-CM

## 2020-06-11 DIAGNOSIS — M1711 Unilateral primary osteoarthritis, right knee: Secondary | ICD-10-CM

## 2020-06-11 DIAGNOSIS — M5412 Radiculopathy, cervical region: Secondary | ICD-10-CM | POA: Diagnosis not present

## 2020-06-11 MED ORDER — OXYCODONE HCL 5 MG PO TABS: 5.0000 mg | ORAL_TABLET | Freq: Two times a day (BID) | ORAL | 0 refills | Status: DC | PRN

## 2020-06-11 NOTE — Progress Notes (Signed)
Patient: [name]  Service Category: E/M  Provider: Gillis Santa, MD  DOB: [birth year]  DOS: 06/11/2020  Location: Office  MRN: [id number]  Setting: Ambulatory outpatient  Referring Provider: Nobie Putnam *  Type: Established Patient  Specialty: Interventional Pain Management  PCP: Olin Hauser, DO  Location: Home  Delivery: TeleHealth     Virtual Encounter - Pain Management PROVIDER NOTE: Information contained herein reflects review and annotations entered in association with encounter. Interpretation of such information and data should be left to medically-trained personnel. Information provided to patient can be located elsewhere in the medical record under "Patient Instructions". Document created using STT-dictation technology, any transcriptional errors that may result from process are unintentional.    Contact & Pharmacy Preferred: [phone] Home: [phone] (home) Mobile: [phone] (mobile) E-mail: [email or web address], [place] [address] [place] [address] Phone: [phone] Fax: [phone]   Pre-screening  [name] offered "in-person" vs "virtual" encounter. She indicated preferring virtual for this encounter.   Reason COVID-19*  Social distancing based on CDC and AMA recommendations.   I contacted [name] on 06/11/2020 via televisit.      I clearly identified myself as Gillis Santa, MD. I verified that I was speaking with the correct person using two identifiers (Name: [name], and date of birth: [birth year]).  Consent I sought verbal advanced consent from [name] for virtual visit interactions. I informed [name] of possible security and privacy concerns, risks, and limitations associated with providing "not-in-person" medical evaluation and management services. I also informed [name] of the availability of "in-person" appointments. Finally, I informed her that there  would be a charge for the virtual visit and that she could be  personally, fully or partially, financially responsible for it. [name] expressed understanding and agreed to proceed.   Historic Elements   [name] is a 54 y.o. year old, female patient evaluated today after her last contact with our practice on 03/14/2020. [name]  has a past medical history of Allergy, Anemia, Anxiety, Arthritis, Ataxia, COPD (chronic obstructive pulmonary disease) ([place]), Depression, Diabetes mellitus with complication ([place]), Dialysis patient ([place]), ESRD on hemodialysis ([place]), Fistula, Hepatitis (2003), Hiatal hernia, Hypercholesterolemia, Hypertension, Migraine, Neuropathy, diabetic ([place]), Non-alcoholic cirrhosis ([place]), Peripheral vascular disease ([place]), Pneumonia (2015), Psoriasis, Renal insufficiency, Tobacco dependence, and Wears dentures. She also  has a past surgical history that includes Tonsillectomy; rt. tubal and ovary removed; Cholecystectomy; Cardiac catheterization (N/A, 07/25/2015); Cardiac catheterization (Left, 07/25/2015); Cardiac catheterization (Left, 10/07/2015); Cardiac catheterization (N/A, 10/07/2015); Cardiac catheterization (10/07/2015); Cardiac catheterization (N/A, 12/17/2015); Incision and drainage abscess (N/A, 07/16/2016); cyst removed  from left hand (Left, 1989); AV fistula placement (Left, 11/2014); Cardiac catheterization (N/A, 01/11/2017); Cardiac catheterization (N/A, 01/11/2017); DIALYSIS/PERMA CATHETER INSERTION (N/A, 05/20/2017); Cesarean section; Tubal ligation; Colonoscopy (N/A, 09/22/2017); Esophagogastroduodenoscopy (N/A, 09/22/2017); polypectomy (09/22/2017); A/V SHUNT INTERVENTION (N/A, 12/16/2017); STENT PLACEMENT VASCULAR ([facility] HX) (Left, 03/2018); Upper Extremity Angiography (Left, 09/29/2019); Aqueous shunt (Left, 12/20/2019); Colonoscopy with propofol (N/A, 04/16/2020); and A/V Fistulagram (Left, 04/23/2020). [name] has a current medication list which includes the following  prescription(s): humira, atropine, brimonidine, diltiazem, dorzolamidel-timolol, ferric citrate, glucose blood, ketoconazole, lantus, levocetirizine, novolog, omeprazole, ondansetron, sevelamer carbonate, trueplus insulin syringe, [START ON 06/20/2020] oxycodone, [START ON 07/20/2020] oxycodone, and [START ON 08/19/2020] oxycodone. She  reports that she has been smoking cigarettes. She has a 5.00 pack-year smoking history. She has never used smokeless tobacco. She reports that  she does not drink alcohol and does not use drugs. [name] is allergic to cinnamon, garlic, onion, tylenol [acetaminophen], ciprofloxacin, and prednisone.   HPI  Today, she is being contacted for medication management.   No significant changes in her medical history since her last visit.  She states that she did have a trigger finger injection of her left ring trigger finger which was very helpful.  She states that this was done by orthopedics.  She would like to have this done at the pain clinic in the future when her left trigger finger starts to bother her.  Otherwise we will refill her oxycodone as below.  She continues dialysis as she is end-stage renal disease.  Her cervical radicular pain is still present but is stable.  No need for repeat cervical ESI at this time.  Pharmacotherapy Assessment  Analgesic  05/21/2020  1   03/14/2020  Oxycodone Hcl 5 MG Tablet  60.00  30 Bi Lat   [id number]   [place] ([id number])   0  15.00 MME  Medicare   [place]     Monitoring: [place] PMP: PDMP reviewed during this encounter.       Pharmacotherapy: No side-effects or adverse reactions reported. Compliance: No problems identified. Effectiveness: Clinically acceptable. Plan: Refer to "POC".  UDS: No results found for: SUMMARY ESRD, does not make urine. Will need serum tox screen at next visit for medication compliance and monitoring  Laboratory Chemistry Profile   Renal Lab Results  Component Value Date   BUN 52 (H) 05/01/2019   CREATININE 7.50 (H)  05/01/2019   BCR 10 12/27/2017   GFRAA 6 (L) 05/01/2019   GFRNONAA 6 (L) 05/01/2019     Hepatic Lab Results  Component Value Date   AST 13 03/26/2020   ALT 12 03/26/2020   ALBUMIN 4.2 03/26/2020   ALKPHOS 72 03/26/2020   HCVAB >11.0 (H) 12/16/2016   LIPASE 21 04/30/2019     Electrolytes Lab Results  Component Value Date   NA 128 (L) 05/01/2019   K 5.2 (H) 05/01/2019   CL 96 (L) 05/01/2019   CALCIUM 7.7 (L) 05/01/2019   MG 2.4 12/14/2017   PHOS 4.2 05/01/2019     Bone No results found for: VD25OH, VD125OH2TOT, XA1287OM7, EH2094BS9, 25OHVITD1, 25OHVITD2, 25OHVITD3, TESTOFREE, TESTOSTERONE   Inflammation (CRP: Acute Phase) (ESR: Chronic Phase) Lab Results  Component Value Date   CRP <0.8 12/16/2016   ESRSEDRATE 117 (H) 10/12/2017   LATICACIDVEN 1.6 04/30/2019       Note: Above Lab results reviewed.   Imaging  PERIPHERAL VASCULAR CATHETERIZATION See op note  Assessment  The primary encounter diagnosis was Trigger finger, left ring finger. Diagnoses of Diabetic polyneuropathy associated with type 2 diabetes mellitus ([place]), Cervical radiculopathy, DDD (degenerative disc disease), cervical, Spondylosis of cervical region without myelopathy or radiculopathy, Primary osteoarthritis of right knee, Lumbar radiculopathy, and Chronic pain syndrome were also pertinent to this visit.  Plan of Care   [name] has a current medication list which includes the following long-term medication(s): humira, diltiazem, lantus, levocetirizine, and novolog.  Pharmacotherapy (Medications Ordered): Meds ordered this encounter  Medications  . oxyCODONE (OXY IR/ROXICODONE) 5 MG immediate release tablet    Sig: Take 1 tablet (5 mg total) by mouth 2 (two) times daily as needed for severe pain. Must last 30 days.    Dispense:  60 tablet    Refill:  0    Chronic Pain. (STOP Act - Not applicable). Fill one day early  if closed on scheduled refill date.  Marland Kitchen oxyCODONE (OXY IR/ROXICODONE)  5 MG immediate release tablet    Sig: Take 1 tablet (5 mg total) by mouth 2 (two) times daily as needed for severe pain. Must last 30 days.    Dispense:  60 tablet    Refill:  0    Chronic Pain. (STOP Act - Not applicable). Fill one day early if closed on scheduled refill date.  Marland Kitchen oxyCODONE (OXY IR/ROXICODONE) 5 MG immediate release tablet    Sig: Take 1 tablet (5 mg total) by mouth 2 (two) times daily as needed for severe pain. Must last 30 days.    Dispense:  60 tablet    Refill:  0    Chronic Pain. (STOP Act - Not applicable). Fill one day early if closed on scheduled refill date.   Orders:  Orders Placed This Encounter  Procedures  . Injection tendon or ligament    Standing Status:   Standing    Number of Occurrences:   1    Standing Expiration Date:   12/11/2020    Scheduling Instructions:     Left ring trigger finger PRN   Follow-up plan:   Return in about 3 months (around 09/11/2020) for Medication Management, in person (serum tox).   Recent Visits Date Type Provider Dept  03/14/20 Office Visit Gillis Santa, MD [facility]  Showing recent visits within past 90 days and meeting all other requirements Today's Visits Date Type Provider Dept  06/11/20 Telemedicine Gillis Santa, MD [facility]  Showing today's visits and meeting all other requirements Future Appointments No visits were found meeting these conditions. Showing future appointments within next 90 days and meeting all other requirements  I discussed the assessment and treatment plan with the patient. The patient was provided an opportunity to ask questions and all were answered. The patient agreed with the plan and demonstrated an understanding of the instructions.  Patient advised to call back or seek an in-person evaluation if the symptoms or condition worsens.  Duration of encounter: 30 minutes.  Note by: Gillis Santa, MD Date: 06/11/2020; Time: 11:10 AM

## 2020-06-20 ENCOUNTER — Other Ambulatory Visit: Payer: Self-pay | Admitting: Family Medicine

## 2020-06-20 DIAGNOSIS — D509 Iron deficiency anemia, unspecified: Secondary | ICD-10-CM | POA: Diagnosis not present

## 2020-06-20 DIAGNOSIS — D631 Anemia in chronic kidney disease: Secondary | ICD-10-CM | POA: Diagnosis not present

## 2020-06-20 DIAGNOSIS — N186 End stage renal disease: Secondary | ICD-10-CM | POA: Diagnosis not present

## 2020-06-20 DIAGNOSIS — J00 Acute nasopharyngitis [common cold]: Secondary | ICD-10-CM

## 2020-06-20 DIAGNOSIS — R1110 Vomiting, unspecified: Secondary | ICD-10-CM | POA: Diagnosis not present

## 2020-06-20 DIAGNOSIS — R110 Nausea: Secondary | ICD-10-CM | POA: Diagnosis not present

## 2020-06-20 DIAGNOSIS — N2581 Secondary hyperparathyroidism of renal origin: Secondary | ICD-10-CM | POA: Diagnosis not present

## 2020-06-20 DIAGNOSIS — Z992 Dependence on renal dialysis: Secondary | ICD-10-CM | POA: Diagnosis not present

## 2020-06-21 DIAGNOSIS — Z992 Dependence on renal dialysis: Secondary | ICD-10-CM | POA: Diagnosis not present

## 2020-06-21 DIAGNOSIS — D509 Iron deficiency anemia, unspecified: Secondary | ICD-10-CM | POA: Diagnosis not present

## 2020-06-21 DIAGNOSIS — R110 Nausea: Secondary | ICD-10-CM | POA: Diagnosis not present

## 2020-06-21 DIAGNOSIS — N2581 Secondary hyperparathyroidism of renal origin: Secondary | ICD-10-CM | POA: Diagnosis not present

## 2020-06-21 DIAGNOSIS — R1110 Vomiting, unspecified: Secondary | ICD-10-CM | POA: Diagnosis not present

## 2020-06-21 DIAGNOSIS — N186 End stage renal disease: Secondary | ICD-10-CM | POA: Diagnosis not present

## 2020-06-24 DIAGNOSIS — Z992 Dependence on renal dialysis: Secondary | ICD-10-CM | POA: Diagnosis not present

## 2020-06-24 DIAGNOSIS — R1110 Vomiting, unspecified: Secondary | ICD-10-CM | POA: Diagnosis not present

## 2020-06-24 DIAGNOSIS — N186 End stage renal disease: Secondary | ICD-10-CM | POA: Diagnosis not present

## 2020-06-24 DIAGNOSIS — D509 Iron deficiency anemia, unspecified: Secondary | ICD-10-CM | POA: Diagnosis not present

## 2020-06-24 DIAGNOSIS — R110 Nausea: Secondary | ICD-10-CM | POA: Diagnosis not present

## 2020-06-24 DIAGNOSIS — N2581 Secondary hyperparathyroidism of renal origin: Secondary | ICD-10-CM | POA: Diagnosis not present

## 2020-06-26 DIAGNOSIS — N186 End stage renal disease: Secondary | ICD-10-CM | POA: Diagnosis not present

## 2020-06-26 DIAGNOSIS — R110 Nausea: Secondary | ICD-10-CM | POA: Diagnosis not present

## 2020-06-26 DIAGNOSIS — D509 Iron deficiency anemia, unspecified: Secondary | ICD-10-CM | POA: Diagnosis not present

## 2020-06-26 DIAGNOSIS — Z992 Dependence on renal dialysis: Secondary | ICD-10-CM | POA: Diagnosis not present

## 2020-06-26 DIAGNOSIS — R1110 Vomiting, unspecified: Secondary | ICD-10-CM | POA: Diagnosis not present

## 2020-06-26 DIAGNOSIS — N2581 Secondary hyperparathyroidism of renal origin: Secondary | ICD-10-CM | POA: Diagnosis not present

## 2020-06-28 DIAGNOSIS — R1110 Vomiting, unspecified: Secondary | ICD-10-CM | POA: Diagnosis not present

## 2020-06-28 DIAGNOSIS — Z992 Dependence on renal dialysis: Secondary | ICD-10-CM | POA: Diagnosis not present

## 2020-06-28 DIAGNOSIS — D509 Iron deficiency anemia, unspecified: Secondary | ICD-10-CM | POA: Diagnosis not present

## 2020-06-28 DIAGNOSIS — N186 End stage renal disease: Secondary | ICD-10-CM | POA: Diagnosis not present

## 2020-06-28 DIAGNOSIS — N2581 Secondary hyperparathyroidism of renal origin: Secondary | ICD-10-CM | POA: Diagnosis not present

## 2020-06-28 DIAGNOSIS — R110 Nausea: Secondary | ICD-10-CM | POA: Diagnosis not present

## 2020-07-01 DIAGNOSIS — D509 Iron deficiency anemia, unspecified: Secondary | ICD-10-CM | POA: Diagnosis not present

## 2020-07-01 DIAGNOSIS — R1110 Vomiting, unspecified: Secondary | ICD-10-CM | POA: Diagnosis not present

## 2020-07-01 DIAGNOSIS — Z992 Dependence on renal dialysis: Secondary | ICD-10-CM | POA: Diagnosis not present

## 2020-07-01 DIAGNOSIS — R110 Nausea: Secondary | ICD-10-CM | POA: Diagnosis not present

## 2020-07-01 DIAGNOSIS — N186 End stage renal disease: Secondary | ICD-10-CM | POA: Diagnosis not present

## 2020-07-01 DIAGNOSIS — N2581 Secondary hyperparathyroidism of renal origin: Secondary | ICD-10-CM | POA: Diagnosis not present

## 2020-07-03 DIAGNOSIS — Z992 Dependence on renal dialysis: Secondary | ICD-10-CM | POA: Diagnosis not present

## 2020-07-03 DIAGNOSIS — N186 End stage renal disease: Secondary | ICD-10-CM | POA: Diagnosis not present

## 2020-07-03 DIAGNOSIS — D509 Iron deficiency anemia, unspecified: Secondary | ICD-10-CM | POA: Diagnosis not present

## 2020-07-03 DIAGNOSIS — R110 Nausea: Secondary | ICD-10-CM | POA: Diagnosis not present

## 2020-07-03 DIAGNOSIS — N2581 Secondary hyperparathyroidism of renal origin: Secondary | ICD-10-CM | POA: Diagnosis not present

## 2020-07-03 DIAGNOSIS — R1110 Vomiting, unspecified: Secondary | ICD-10-CM | POA: Diagnosis not present

## 2020-07-05 DIAGNOSIS — N186 End stage renal disease: Secondary | ICD-10-CM | POA: Diagnosis not present

## 2020-07-05 DIAGNOSIS — N2581 Secondary hyperparathyroidism of renal origin: Secondary | ICD-10-CM | POA: Diagnosis not present

## 2020-07-05 DIAGNOSIS — R110 Nausea: Secondary | ICD-10-CM | POA: Diagnosis not present

## 2020-07-05 DIAGNOSIS — R1110 Vomiting, unspecified: Secondary | ICD-10-CM | POA: Diagnosis not present

## 2020-07-05 DIAGNOSIS — Z992 Dependence on renal dialysis: Secondary | ICD-10-CM | POA: Diagnosis not present

## 2020-07-05 DIAGNOSIS — D509 Iron deficiency anemia, unspecified: Secondary | ICD-10-CM | POA: Diagnosis not present

## 2020-07-08 DIAGNOSIS — Z992 Dependence on renal dialysis: Secondary | ICD-10-CM | POA: Diagnosis not present

## 2020-07-08 DIAGNOSIS — R110 Nausea: Secondary | ICD-10-CM | POA: Diagnosis not present

## 2020-07-08 DIAGNOSIS — D509 Iron deficiency anemia, unspecified: Secondary | ICD-10-CM | POA: Diagnosis not present

## 2020-07-08 DIAGNOSIS — N2581 Secondary hyperparathyroidism of renal origin: Secondary | ICD-10-CM | POA: Diagnosis not present

## 2020-07-08 DIAGNOSIS — R1110 Vomiting, unspecified: Secondary | ICD-10-CM | POA: Diagnosis not present

## 2020-07-08 DIAGNOSIS — N186 End stage renal disease: Secondary | ICD-10-CM | POA: Diagnosis not present

## 2020-07-10 DIAGNOSIS — R1110 Vomiting, unspecified: Secondary | ICD-10-CM | POA: Diagnosis not present

## 2020-07-10 DIAGNOSIS — R110 Nausea: Secondary | ICD-10-CM | POA: Diagnosis not present

## 2020-07-10 DIAGNOSIS — D509 Iron deficiency anemia, unspecified: Secondary | ICD-10-CM | POA: Diagnosis not present

## 2020-07-10 DIAGNOSIS — N186 End stage renal disease: Secondary | ICD-10-CM | POA: Diagnosis not present

## 2020-07-10 DIAGNOSIS — N2581 Secondary hyperparathyroidism of renal origin: Secondary | ICD-10-CM | POA: Diagnosis not present

## 2020-07-10 DIAGNOSIS — Z992 Dependence on renal dialysis: Secondary | ICD-10-CM | POA: Diagnosis not present

## 2020-07-11 ENCOUNTER — Other Ambulatory Visit: Payer: Self-pay | Admitting: Family Medicine

## 2020-07-11 DIAGNOSIS — IMO0002 Reserved for concepts with insufficient information to code with codable children: Secondary | ICD-10-CM

## 2020-07-11 DIAGNOSIS — N186 End stage renal disease: Secondary | ICD-10-CM

## 2020-07-11 NOTE — Telephone Encounter (Signed)
Attempted to call patient to schedule appointment in August for follow up- RF once to make sure patient does not run out of insulin. Left message for patient to call office for appointment.

## 2020-07-12 DIAGNOSIS — Z992 Dependence on renal dialysis: Secondary | ICD-10-CM | POA: Diagnosis not present

## 2020-07-12 DIAGNOSIS — R1110 Vomiting, unspecified: Secondary | ICD-10-CM | POA: Diagnosis not present

## 2020-07-12 DIAGNOSIS — N186 End stage renal disease: Secondary | ICD-10-CM | POA: Diagnosis not present

## 2020-07-12 DIAGNOSIS — R110 Nausea: Secondary | ICD-10-CM | POA: Diagnosis not present

## 2020-07-12 DIAGNOSIS — N2581 Secondary hyperparathyroidism of renal origin: Secondary | ICD-10-CM | POA: Diagnosis not present

## 2020-07-12 DIAGNOSIS — D509 Iron deficiency anemia, unspecified: Secondary | ICD-10-CM | POA: Diagnosis not present

## 2020-07-15 DIAGNOSIS — R110 Nausea: Secondary | ICD-10-CM | POA: Diagnosis not present

## 2020-07-15 DIAGNOSIS — R1110 Vomiting, unspecified: Secondary | ICD-10-CM | POA: Diagnosis not present

## 2020-07-15 DIAGNOSIS — E119 Type 2 diabetes mellitus without complications: Secondary | ICD-10-CM | POA: Diagnosis not present

## 2020-07-15 DIAGNOSIS — D509 Iron deficiency anemia, unspecified: Secondary | ICD-10-CM | POA: Diagnosis not present

## 2020-07-15 DIAGNOSIS — N186 End stage renal disease: Secondary | ICD-10-CM | POA: Diagnosis not present

## 2020-07-15 DIAGNOSIS — N2581 Secondary hyperparathyroidism of renal origin: Secondary | ICD-10-CM | POA: Diagnosis not present

## 2020-07-15 DIAGNOSIS — Z992 Dependence on renal dialysis: Secondary | ICD-10-CM | POA: Diagnosis not present

## 2020-07-15 DIAGNOSIS — Z794 Long term (current) use of insulin: Secondary | ICD-10-CM | POA: Diagnosis not present

## 2020-07-17 DIAGNOSIS — D509 Iron deficiency anemia, unspecified: Secondary | ICD-10-CM | POA: Diagnosis not present

## 2020-07-17 DIAGNOSIS — R110 Nausea: Secondary | ICD-10-CM | POA: Diagnosis not present

## 2020-07-17 DIAGNOSIS — R1110 Vomiting, unspecified: Secondary | ICD-10-CM | POA: Diagnosis not present

## 2020-07-17 DIAGNOSIS — Z992 Dependence on renal dialysis: Secondary | ICD-10-CM | POA: Diagnosis not present

## 2020-07-17 DIAGNOSIS — N186 End stage renal disease: Secondary | ICD-10-CM | POA: Diagnosis not present

## 2020-07-17 DIAGNOSIS — N2581 Secondary hyperparathyroidism of renal origin: Secondary | ICD-10-CM | POA: Diagnosis not present

## 2020-07-17 LAB — BASIC METABOLIC PANEL
CO2: 23 — AB (ref 13–22)
Chloride: 101 (ref 99–108)
Creatinine: 8.2 — AB (ref 0.5–1.1)
Glucose: 272
Potassium: 5.5 — AB (ref 3.4–5.3)
Sodium: 137 (ref 137–147)

## 2020-07-17 LAB — CBC AND DIFFERENTIAL
HCT: 38 (ref 36–46)
Hemoglobin: 11.9 — AB (ref 12.0–16.0)
Platelets: 179 (ref 150–399)
WBC: 7.5

## 2020-07-17 LAB — IRON,TIBC AND FERRITIN PANEL
%SAT: 47
Ferritin: 810
Iron: 105
TIBC: 222
UIBC: 117

## 2020-07-17 LAB — COMPREHENSIVE METABOLIC PANEL
Albumin: 4 (ref 3.5–5.0)
Calcium: 7.6 — AB (ref 8.7–10.7)

## 2020-07-17 LAB — HEMOGLOBIN A1C: Hemoglobin A1C: 7.7

## 2020-07-17 LAB — HEPATIC FUNCTION PANEL
AST: 15 (ref 13–35)
Alkaline Phosphatase: 74 (ref 25–125)

## 2020-07-18 ENCOUNTER — Other Ambulatory Visit: Payer: Self-pay | Admitting: Family Medicine

## 2020-07-18 DIAGNOSIS — B372 Candidiasis of skin and nail: Secondary | ICD-10-CM

## 2020-07-19 DIAGNOSIS — R110 Nausea: Secondary | ICD-10-CM | POA: Diagnosis not present

## 2020-07-19 DIAGNOSIS — N2581 Secondary hyperparathyroidism of renal origin: Secondary | ICD-10-CM | POA: Diagnosis not present

## 2020-07-19 DIAGNOSIS — N186 End stage renal disease: Secondary | ICD-10-CM | POA: Diagnosis not present

## 2020-07-19 DIAGNOSIS — Z992 Dependence on renal dialysis: Secondary | ICD-10-CM | POA: Diagnosis not present

## 2020-07-19 DIAGNOSIS — R1110 Vomiting, unspecified: Secondary | ICD-10-CM | POA: Diagnosis not present

## 2020-07-19 DIAGNOSIS — D509 Iron deficiency anemia, unspecified: Secondary | ICD-10-CM | POA: Diagnosis not present

## 2020-07-20 DIAGNOSIS — N186 End stage renal disease: Secondary | ICD-10-CM | POA: Diagnosis not present

## 2020-07-20 DIAGNOSIS — Z992 Dependence on renal dialysis: Secondary | ICD-10-CM | POA: Diagnosis not present

## 2020-07-21 DIAGNOSIS — Z992 Dependence on renal dialysis: Secondary | ICD-10-CM | POA: Diagnosis not present

## 2020-07-21 DIAGNOSIS — D509 Iron deficiency anemia, unspecified: Secondary | ICD-10-CM | POA: Diagnosis not present

## 2020-07-21 DIAGNOSIS — N186 End stage renal disease: Secondary | ICD-10-CM | POA: Diagnosis not present

## 2020-07-21 DIAGNOSIS — N2581 Secondary hyperparathyroidism of renal origin: Secondary | ICD-10-CM | POA: Diagnosis not present

## 2020-07-22 DIAGNOSIS — D509 Iron deficiency anemia, unspecified: Secondary | ICD-10-CM | POA: Diagnosis not present

## 2020-07-22 DIAGNOSIS — N2581 Secondary hyperparathyroidism of renal origin: Secondary | ICD-10-CM | POA: Diagnosis not present

## 2020-07-22 DIAGNOSIS — N186 End stage renal disease: Secondary | ICD-10-CM | POA: Diagnosis not present

## 2020-07-22 DIAGNOSIS — Z992 Dependence on renal dialysis: Secondary | ICD-10-CM | POA: Diagnosis not present

## 2020-07-24 DIAGNOSIS — N2581 Secondary hyperparathyroidism of renal origin: Secondary | ICD-10-CM | POA: Diagnosis not present

## 2020-07-24 DIAGNOSIS — N186 End stage renal disease: Secondary | ICD-10-CM | POA: Diagnosis not present

## 2020-07-24 DIAGNOSIS — Z992 Dependence on renal dialysis: Secondary | ICD-10-CM | POA: Diagnosis not present

## 2020-07-24 DIAGNOSIS — D509 Iron deficiency anemia, unspecified: Secondary | ICD-10-CM | POA: Diagnosis not present

## 2020-07-26 DIAGNOSIS — D509 Iron deficiency anemia, unspecified: Secondary | ICD-10-CM | POA: Diagnosis not present

## 2020-07-26 DIAGNOSIS — N2581 Secondary hyperparathyroidism of renal origin: Secondary | ICD-10-CM | POA: Diagnosis not present

## 2020-07-26 DIAGNOSIS — Z992 Dependence on renal dialysis: Secondary | ICD-10-CM | POA: Diagnosis not present

## 2020-07-26 DIAGNOSIS — N186 End stage renal disease: Secondary | ICD-10-CM | POA: Diagnosis not present

## 2020-07-29 DIAGNOSIS — D509 Iron deficiency anemia, unspecified: Secondary | ICD-10-CM | POA: Diagnosis not present

## 2020-07-29 DIAGNOSIS — N186 End stage renal disease: Secondary | ICD-10-CM | POA: Diagnosis not present

## 2020-07-29 DIAGNOSIS — Z992 Dependence on renal dialysis: Secondary | ICD-10-CM | POA: Diagnosis not present

## 2020-07-29 DIAGNOSIS — N2581 Secondary hyperparathyroidism of renal origin: Secondary | ICD-10-CM | POA: Diagnosis not present

## 2020-07-30 ENCOUNTER — Other Ambulatory Visit: Payer: Self-pay

## 2020-07-30 ENCOUNTER — Encounter: Payer: Self-pay | Admitting: Family Medicine

## 2020-07-30 ENCOUNTER — Ambulatory Visit (INDEPENDENT_AMBULATORY_CARE_PROVIDER_SITE_OTHER): Payer: Medicare Other | Admitting: Family Medicine

## 2020-07-30 VITALS — BP 181/54 | HR 72 | Temp 97.1°F | Ht 67.0 in | Wt 229.6 lb

## 2020-07-30 DIAGNOSIS — E1122 Type 2 diabetes mellitus with diabetic chronic kidney disease: Secondary | ICD-10-CM

## 2020-07-30 DIAGNOSIS — F3341 Major depressive disorder, recurrent, in partial remission: Secondary | ICD-10-CM | POA: Insufficient documentation

## 2020-07-30 DIAGNOSIS — F411 Generalized anxiety disorder: Secondary | ICD-10-CM | POA: Diagnosis not present

## 2020-07-30 DIAGNOSIS — F331 Major depressive disorder, recurrent, moderate: Secondary | ICD-10-CM | POA: Diagnosis not present

## 2020-07-30 DIAGNOSIS — Z992 Dependence on renal dialysis: Secondary | ICD-10-CM | POA: Diagnosis not present

## 2020-07-30 DIAGNOSIS — L22 Diaper dermatitis: Secondary | ICD-10-CM

## 2020-07-30 DIAGNOSIS — E1142 Type 2 diabetes mellitus with diabetic polyneuropathy: Secondary | ICD-10-CM

## 2020-07-30 DIAGNOSIS — I10 Essential (primary) hypertension: Secondary | ICD-10-CM | POA: Diagnosis not present

## 2020-07-30 DIAGNOSIS — N186 End stage renal disease: Secondary | ICD-10-CM | POA: Diagnosis not present

## 2020-07-30 DIAGNOSIS — E113493 Type 2 diabetes mellitus with severe nonproliferative diabetic retinopathy without macular edema, bilateral: Secondary | ICD-10-CM

## 2020-07-30 DIAGNOSIS — B372 Candidiasis of skin and nail: Secondary | ICD-10-CM

## 2020-07-30 MED ORDER — INSULIN ASPART 100 UNIT/ML ~~LOC~~ SOLN: SUBCUTANEOUS | 5 refills | Status: DC

## 2020-07-30 MED ORDER — DILTIAZEM HCL 30 MG PO TABS: 30.0000 mg | ORAL_TABLET | Freq: Three times a day (TID) | ORAL | 2 refills | Status: DC

## 2020-07-30 MED ORDER — INSULIN GLARGINE 100 UNIT/ML ~~LOC~~ SOLN: SUBCUTANEOUS | 5 refills | Status: DC

## 2020-07-30 MED ORDER — KETOCONAZOLE 2 % EX CREA: TOPICAL_CREAM | CUTANEOUS | 5 refills | Status: DC

## 2020-07-30 NOTE — Progress Notes (Signed)
Subjective:    Patient ID: [name], female    DOB: [birth year], 54 y.o.   MRN: [id number]  [name] is a 54 y.o. female presenting on 07/30/2020 for Diabetes (A1c 7.7--07/17/2020 done at dialysis center) and Hypertension (as per patient at dialysis her B/P goes down so takes meds and when come back home it stays high so she takes another meds to bring it down)   HPI   CHRONIC DM, Type 2/ ESRD on HD Diabetic Neuropathy, bilateral feet Left Foot Diabetic Ulcer- followed by Dr Ron Wilson Podiatry PAD Followed by Vascular specialist  Today reports. No major fluctuations in CBGs. Doing well. A1c has improved to 7.7 (06/2020 on last lab from nephro) prior lab 8.8 She has diabetic shoes that we have ordered earlier this year. She is on chronic insulin therapy has ESRD on HD M-W-F Meds:Lantus 10 units daily, Novolog mealtime 2-3 units x 3 daily with meals (not take if missed meal) Reports good compliance. Tolerating well w/o side-effectsdoes admit to some wt gain on insulin Lifestyle: - Diet (improving diabetic diet) She has upcoming apt with [place] within 1-2 months, retinopathy, cataracts, L eye blind history of glaucoma as well. Admits some nausea with spasm in espohagus / gastroparesis - says if takes insulin and phosphate binders may not eat much, so she eats meal first. Then takes med Denies hypoglycemia, polyuria, visual changes, numbness or tingling.  CHRONIC HTN: Reports fluctuating BP, often if go to dialysis will have BP 140/70, then if on HD she will have lower BP down to 70/40, she will take Midodrine to help raise her BP. Then will have symptoms of headache and hypertension. Current Meds - Diltiazem 60mg  - (ordered 3 times day) usually taking 1-2 times a day. Often taking  Reports good compliance, took meds today. Tolerating well, w/o complaints. Denies CP, dyspnea, HA, edema, dizziness / lightheadedness   Left Ear Pain and reduced hearing Reports for 2  months Tried sweet oil, got hearing back Admits some sinus congestion  Major Depression, recurrent chronic moderate Generalized Anxiety Disorder Reports chronic problems. Currently flared up, bothering her. Her health is primary concern causing stress among other factors. Not interested in other treatments at this time.   Health Maintenance:  Has uterus and cervix - Missing Right fallopian tube/ovary. Her last pap smear was [facility], will request record.  UTD COVID vaccine she will notify us with card information on dates.  Depression screen [facility] 2/9 07/30/2020 01/23/2020 02/14/2019  Decreased Interest 1 0 0  Down, Depressed, Hopeless 3 0 0  PHQ - 2 Score 4 0 0  Altered sleeping 3 - -  Tired, decreased energy 3 - -  Change in appetite 3 - -  Feeling bad or failure about yourself  3 - -  Trouble concentrating 0 - -  Moving slowly or fidgety/restless 1 - -  Suicidal thoughts 0 - -  PHQ-9 Score 17 - -  Difficult doing work/chores Extremely dIfficult - -  Some recent data might be hidden   GAD 7 : Generalized Anxiety Score 07/30/2020 06/29/2017  Nervous, Anxious, on Edge 3 3  Control/stop worrying 3 0  Worry too much - different things 3 0  Trouble relaxing 3 1  Restless 3 3  Easily annoyed or irritable 3 0  Afraid - awful might happen 3 1  Total GAD 7 Score 21 8  Anxiety Difficulty Extremely difficult Very difficult  Social History   Tobacco Use  . Smoking status: Light Tobacco Smoker    Packs/day: 0.25    Years: 20.00    Pack years: 5.00    Types: Cigarettes    Last attempt to quit: 05/21/2017    Years since quitting: 3.1  . Smokeless tobacco: Never Used  Vaping Use  . Vaping Use: Never used  Substance Use Topics  . Alcohol use: No  . Drug use: No    Review of Systems Per HPI unless specifically indicated above     Objective:    BP (!) 181/54   Pulse 72   Temp (!) 97.1 F (36.2 C) (Temporal)   Ht 5\' 7"  (1.702 m)    Wt 229 lb 9.6 oz (104.1 kg)   SpO2 97%   BMI 35.96 kg/m   Wt Readings from Last 3 Encounters:  07/30/20 229 lb 9.6 oz (104.1 kg)  04/16/20 230 lb (104.3 kg)  04/11/20 231 lb 3.2 oz (104.9 kg)    Physical Exam Vitals and nursing note reviewed.  Constitutional:      General: She is not in acute distress.    Appearance: She is well-developed. She is obese. She is not diaphoretic.     Comments: Well-appearing, comfortable, cooperative  HENT:     Head: Normocephalic and atraumatic.  Eyes:     General:        Right eye: No discharge.        Left eye: No discharge.     Conjunctiva/sclera: Conjunctivae normal.  Neck:     Thyroid: No thyromegaly.  Cardiovascular:     Rate and Rhythm: Normal rate and regular rhythm.     Heart sounds: Normal heart sounds. No murmur heard.   Pulmonary:     Effort: Pulmonary effort is normal. No respiratory distress.     Breath sounds: Normal breath sounds. No wheezing or rales.  Musculoskeletal:        General: Normal range of motion.     Cervical back: Normal range of motion and neck supple.     Right lower leg: No edema.     Left lower leg: No edema.  Lymphadenopathy:     Cervical: No cervical adenopathy.  Skin:    General: Skin is warm and dry.     Findings: No erythema or rash.  Neurological:     Mental Status: She is alert and oriented to person, place, and time.  Psychiatric:        Behavior: Behavior normal.     Comments: Well groomed, good eye contact, normal speech and thoughts        Results for orders placed or performed in visit on 07/30/20  Hemoglobin A1c  Result Value Ref Range   Hemoglobin A1C 7.7   Iron, TIBC and Ferritin Panel  Result Value Ref Range   Ferritin 810    Iron 105    TIBC 222    UIBC 117    %SAT 47   CBC and differential  Result Value Ref Range   Hemoglobin 11.9 (A) 12.0 - 16.0   HCT 38 36 - 46   Platelets 179 150 - 399   WBC 7.5   Basic metabolic panel  Result Value Ref Range   Glucose 272     CO2 23 (A) 13 - 22   Creatinine 8.2 (A) 0.5 - 1.1   Potassium 5.5 (A) 3.4 - 5.3   Sodium 137 137 - 147   Chloride 101 99 - 108  Comprehensive metabolic panel  Result Value Ref Range   Calcium 7.6 (A) 8.7 - 10.7   Albumin 4.0 3.5 - 5.0  Hepatic function panel  Result Value Ref Range   Alkaline Phosphatase 74 25 - 125   AST 15 13 - 35      Assessment & Plan:   Problem List Items Addressed This Visit    Type 2 diabetes mellitus with ESRD (end-stage renal disease) (HCC) - Primary   Relevant Medications   insulin glargine (LANTUS) 100 UNIT/ML injection   insulin aspart (NOVOLOG) 100 UNIT/ML injection   Non-proliferative diabetic retinopathy, severe, both eyes (HCC)   Relevant Medications   insulin glargine (LANTUS) 100 UNIT/ML injection   insulin aspart (NOVOLOG) 100 UNIT/ML injection   Major depressive disorder, recurrent, moderate (HCC)   GAD (generalized anxiety disorder)   Essential hypertension   Relevant Medications   midodrine (PROAMATINE) 10 MG tablet   diltiazem (CARDIZEM) 30 MG tablet   ESRD (end stage renal disease) on dialysis (HCC)   Diabetic polyneuropathy associated with type 2 diabetes mellitus (HCC)   Relevant Medications   insulin glargine (LANTUS) 100 UNIT/ML injection   insulin aspart (NOVOLOG) 100 UNIT/ML injection    Other Visit Diagnoses    Candidal diaper rash       Relevant Medications   ketoconazole (NIZORAL) 2 % cream      Reviewed records from Nephrology including lab results abstracted into chart today.  #HTN, labile Affected by ESRD HD days On minimal dose Diltiazem IR, seems to have some labile readings, significantly elevated at times and then low hypotension risk following HD sessions. - Improved on reduced dosing at times and occasional adjusted dosing regimen, skipping dose prior to HD - Trial new regimen Diltiazem IR 30mg  TID - she may do full dose on non HD day AM / afternoon / HALF dose in PM and skip dose in AM prior to HD, then dose  half or whole post-HD as indicated. Discussed monitoring her Home BP readings and working with Nephrology to achieve appropriate BP goals  #Type 2 DM Improving control now Q2I 7.7 Complications - ESRD on HD, peripheral neuropathy, DM retinopathy, hyperlipidemia, GERD, depression, obesity, increases risk of future cardiovascular complications   Plan:  1. Continue current therapy - insulin, Lantus 10u daily, Novolog mea time wc 2u TID adjust accordingly, new order to add refills 2. Encourage improved lifestyle - low carb, low sugar diet, reduce portion size, continue improving regular exercise 3. Check CBG, bring log to next visit for review 4. OFF ARB 5. UTD DM foot, has DM Shoes 6 Advised to schedule DM ophtho exam, send record  Future consider GLP1 therapy if indicated.  ---------- #Candidal rash Re order Ketoconazole, x 30g now  #Major Depression, recurrent chronic moderate GAD Chronic problems, currently reporting higher scores with stressors Not on medication, she declines treatment at this time.   Meds ordered this encounter  Medications  . insulin glargine (LANTUS) 100 UNIT/ML injection    Sig: INJECT 10 UNITS SUBCUTANEOUSLY ONCE A DAY    Dispense:  10 mL    Refill:  5    This prescription was filled on 06/21/2020. Any refills authorized will be placed on file.  . insulin aspart (NOVOLOG) 100 UNIT/ML injection    Sig: INJECT 2 UNITS SUBCUTANEOUSLY 3 TIMES A DAY BEFORE MEALS    Dispense:  10 mL    Refill:  5    This prescription was filled on 06/21/2020. Any refills authorized will be  placed on file.  Marland Kitchen ketoconazole (NIZORAL) 2 % cream    Sig: APPLY TOPICALLY ONTO THE SKIN (BOTH FEET) TWICE A DAY AS NEEDED    Dispense:  30 g    Refill:  5    Re ordered with increased amount/refills  . diltiazem (CARDIZEM) 30 MG tablet    Sig: Take 1 tablet (30 mg total) by mouth 3 (three) times daily. May reduce dose to half tab as preferred day before Hemodialysis. Skip dose on morning  of HD.    Dispense:  90 tablet    Refill:  2    Dose reduction from 60 to 30mg  tab      Follow up plan: Return in about 3 months (around 10/30/2020) for 3 month DM A1c (if not already done by kidney doctors), HTN.   Nobie Putnam, [facility] 07/30/2020, 8:20 AM

## 2020-07-30 NOTE — Patient Instructions (Addendum)
Thank you for coming to the office today.  Refilled Insulin, added extra refills  LOWER dose Diltiazem from 60 to 30mg , may take HALF dose if preferred for more even BP measurements, can take 3 times a day before HD. Then on day of HD can skip AM dose and then restart after if able. May mix and match - may do whole vs half pill to figure out best times to take it.  Added extra tube to ketoconazole  Will request last pap smear from [facility], I do recommend repeat within 1 year  Reconsider COVID19 vaccine  Schedule Diabetic Eye Exam - [facility] - have them send [place] copy of the report  Please schedule a Follow-up Appointment to: Return in about 3 months (around 10/30/2020) for 3 month DM A1c (if not already done by kidney doctors), HTN.  If you have any other questions or concerns, please feel free to call the office or send a message through [place]. You may also schedule an earlier appointment if necessary.  Additionally, you may be receiving a survey about your experience at our office within a few days to 1 week by e-mail or mail. We value your feedback.  Nobie Putnam, DO [place]

## 2020-07-31 DIAGNOSIS — N186 End stage renal disease: Secondary | ICD-10-CM | POA: Diagnosis not present

## 2020-07-31 DIAGNOSIS — Z992 Dependence on renal dialysis: Secondary | ICD-10-CM | POA: Diagnosis not present

## 2020-07-31 DIAGNOSIS — D509 Iron deficiency anemia, unspecified: Secondary | ICD-10-CM | POA: Diagnosis not present

## 2020-07-31 DIAGNOSIS — N2581 Secondary hyperparathyroidism of renal origin: Secondary | ICD-10-CM | POA: Diagnosis not present

## 2020-08-02 DIAGNOSIS — N186 End stage renal disease: Secondary | ICD-10-CM | POA: Diagnosis not present

## 2020-08-02 DIAGNOSIS — N2581 Secondary hyperparathyroidism of renal origin: Secondary | ICD-10-CM | POA: Diagnosis not present

## 2020-08-02 DIAGNOSIS — Z992 Dependence on renal dialysis: Secondary | ICD-10-CM | POA: Diagnosis not present

## 2020-08-02 DIAGNOSIS — D509 Iron deficiency anemia, unspecified: Secondary | ICD-10-CM | POA: Diagnosis not present

## 2020-08-05 DIAGNOSIS — N186 End stage renal disease: Secondary | ICD-10-CM | POA: Diagnosis not present

## 2020-08-05 DIAGNOSIS — Z992 Dependence on renal dialysis: Secondary | ICD-10-CM | POA: Diagnosis not present

## 2020-08-05 DIAGNOSIS — N2581 Secondary hyperparathyroidism of renal origin: Secondary | ICD-10-CM | POA: Diagnosis not present

## 2020-08-05 DIAGNOSIS — D509 Iron deficiency anemia, unspecified: Secondary | ICD-10-CM | POA: Diagnosis not present

## 2020-08-07 DIAGNOSIS — Z992 Dependence on renal dialysis: Secondary | ICD-10-CM | POA: Diagnosis not present

## 2020-08-07 DIAGNOSIS — D509 Iron deficiency anemia, unspecified: Secondary | ICD-10-CM | POA: Diagnosis not present

## 2020-08-07 DIAGNOSIS — N2581 Secondary hyperparathyroidism of renal origin: Secondary | ICD-10-CM | POA: Diagnosis not present

## 2020-08-07 DIAGNOSIS — N186 End stage renal disease: Secondary | ICD-10-CM | POA: Diagnosis not present

## 2020-08-09 DIAGNOSIS — N2581 Secondary hyperparathyroidism of renal origin: Secondary | ICD-10-CM | POA: Diagnosis not present

## 2020-08-09 DIAGNOSIS — D509 Iron deficiency anemia, unspecified: Secondary | ICD-10-CM | POA: Diagnosis not present

## 2020-08-09 DIAGNOSIS — N186 End stage renal disease: Secondary | ICD-10-CM | POA: Diagnosis not present

## 2020-08-09 DIAGNOSIS — Z992 Dependence on renal dialysis: Secondary | ICD-10-CM | POA: Diagnosis not present

## 2020-08-12 DIAGNOSIS — Z992 Dependence on renal dialysis: Secondary | ICD-10-CM | POA: Diagnosis not present

## 2020-08-12 DIAGNOSIS — N186 End stage renal disease: Secondary | ICD-10-CM | POA: Diagnosis not present

## 2020-08-12 DIAGNOSIS — N2581 Secondary hyperparathyroidism of renal origin: Secondary | ICD-10-CM | POA: Diagnosis not present

## 2020-08-12 DIAGNOSIS — D509 Iron deficiency anemia, unspecified: Secondary | ICD-10-CM | POA: Diagnosis not present

## 2020-08-13 DIAGNOSIS — L97521 Non-pressure chronic ulcer of other part of left foot limited to breakdown of skin: Secondary | ICD-10-CM | POA: Diagnosis not present

## 2020-08-13 DIAGNOSIS — Z794 Long term (current) use of insulin: Secondary | ICD-10-CM | POA: Diagnosis not present

## 2020-08-13 DIAGNOSIS — E1140 Type 2 diabetes mellitus with diabetic neuropathy, unspecified: Secondary | ICD-10-CM | POA: Diagnosis not present

## 2020-08-14 DIAGNOSIS — N186 End stage renal disease: Secondary | ICD-10-CM | POA: Diagnosis not present

## 2020-08-14 DIAGNOSIS — Z992 Dependence on renal dialysis: Secondary | ICD-10-CM | POA: Diagnosis not present

## 2020-08-14 DIAGNOSIS — N2581 Secondary hyperparathyroidism of renal origin: Secondary | ICD-10-CM | POA: Diagnosis not present

## 2020-08-14 DIAGNOSIS — D509 Iron deficiency anemia, unspecified: Secondary | ICD-10-CM | POA: Diagnosis not present

## 2020-08-16 DIAGNOSIS — N2581 Secondary hyperparathyroidism of renal origin: Secondary | ICD-10-CM | POA: Diagnosis not present

## 2020-08-16 DIAGNOSIS — N186 End stage renal disease: Secondary | ICD-10-CM | POA: Diagnosis not present

## 2020-08-16 DIAGNOSIS — D509 Iron deficiency anemia, unspecified: Secondary | ICD-10-CM | POA: Diagnosis not present

## 2020-08-16 DIAGNOSIS — Z992 Dependence on renal dialysis: Secondary | ICD-10-CM | POA: Diagnosis not present

## 2020-08-19 ENCOUNTER — Other Ambulatory Visit: Payer: Self-pay | Admitting: Family Medicine

## 2020-08-19 DIAGNOSIS — N186 End stage renal disease: Secondary | ICD-10-CM

## 2020-08-19 DIAGNOSIS — N2581 Secondary hyperparathyroidism of renal origin: Secondary | ICD-10-CM | POA: Diagnosis not present

## 2020-08-19 DIAGNOSIS — Z992 Dependence on renal dialysis: Secondary | ICD-10-CM | POA: Diagnosis not present

## 2020-08-19 DIAGNOSIS — D509 Iron deficiency anemia, unspecified: Secondary | ICD-10-CM | POA: Diagnosis not present

## 2020-08-20 DIAGNOSIS — E113512 Type 2 diabetes mellitus with proliferative diabetic retinopathy with macular edema, left eye: Secondary | ICD-10-CM | POA: Diagnosis not present

## 2020-08-20 DIAGNOSIS — H4052X0 Glaucoma secondary to other eye disorders, left eye, stage unspecified: Secondary | ICD-10-CM | POA: Diagnosis not present

## 2020-08-20 DIAGNOSIS — Z992 Dependence on renal dialysis: Secondary | ICD-10-CM | POA: Diagnosis not present

## 2020-08-20 DIAGNOSIS — N186 End stage renal disease: Secondary | ICD-10-CM | POA: Diagnosis not present

## 2020-08-21 DIAGNOSIS — D509 Iron deficiency anemia, unspecified: Secondary | ICD-10-CM | POA: Diagnosis not present

## 2020-08-21 DIAGNOSIS — N186 End stage renal disease: Secondary | ICD-10-CM | POA: Diagnosis not present

## 2020-08-21 DIAGNOSIS — Z992 Dependence on renal dialysis: Secondary | ICD-10-CM | POA: Diagnosis not present

## 2020-08-21 DIAGNOSIS — N2581 Secondary hyperparathyroidism of renal origin: Secondary | ICD-10-CM | POA: Diagnosis not present

## 2020-08-21 DIAGNOSIS — D631 Anemia in chronic kidney disease: Secondary | ICD-10-CM | POA: Diagnosis not present

## 2020-08-23 DIAGNOSIS — D509 Iron deficiency anemia, unspecified: Secondary | ICD-10-CM | POA: Diagnosis not present

## 2020-08-23 DIAGNOSIS — N2581 Secondary hyperparathyroidism of renal origin: Secondary | ICD-10-CM | POA: Diagnosis not present

## 2020-08-23 DIAGNOSIS — Z992 Dependence on renal dialysis: Secondary | ICD-10-CM | POA: Diagnosis not present

## 2020-08-23 DIAGNOSIS — N186 End stage renal disease: Secondary | ICD-10-CM | POA: Diagnosis not present

## 2020-08-23 DIAGNOSIS — D631 Anemia in chronic kidney disease: Secondary | ICD-10-CM | POA: Diagnosis not present

## 2020-08-26 DIAGNOSIS — D509 Iron deficiency anemia, unspecified: Secondary | ICD-10-CM | POA: Diagnosis not present

## 2020-08-26 DIAGNOSIS — N2581 Secondary hyperparathyroidism of renal origin: Secondary | ICD-10-CM | POA: Diagnosis not present

## 2020-08-26 DIAGNOSIS — Z992 Dependence on renal dialysis: Secondary | ICD-10-CM | POA: Diagnosis not present

## 2020-08-26 DIAGNOSIS — N186 End stage renal disease: Secondary | ICD-10-CM | POA: Diagnosis not present

## 2020-08-26 DIAGNOSIS — D631 Anemia in chronic kidney disease: Secondary | ICD-10-CM | POA: Diagnosis not present

## 2020-08-28 DIAGNOSIS — N2581 Secondary hyperparathyroidism of renal origin: Secondary | ICD-10-CM | POA: Diagnosis not present

## 2020-08-28 DIAGNOSIS — Z992 Dependence on renal dialysis: Secondary | ICD-10-CM | POA: Diagnosis not present

## 2020-08-28 DIAGNOSIS — N186 End stage renal disease: Secondary | ICD-10-CM | POA: Diagnosis not present

## 2020-08-28 DIAGNOSIS — D631 Anemia in chronic kidney disease: Secondary | ICD-10-CM | POA: Diagnosis not present

## 2020-08-28 DIAGNOSIS — D509 Iron deficiency anemia, unspecified: Secondary | ICD-10-CM | POA: Diagnosis not present

## 2020-08-30 DIAGNOSIS — N186 End stage renal disease: Secondary | ICD-10-CM | POA: Diagnosis not present

## 2020-08-30 DIAGNOSIS — D509 Iron deficiency anemia, unspecified: Secondary | ICD-10-CM | POA: Diagnosis not present

## 2020-08-30 DIAGNOSIS — D631 Anemia in chronic kidney disease: Secondary | ICD-10-CM | POA: Diagnosis not present

## 2020-08-30 DIAGNOSIS — Z992 Dependence on renal dialysis: Secondary | ICD-10-CM | POA: Diagnosis not present

## 2020-08-30 DIAGNOSIS — N2581 Secondary hyperparathyroidism of renal origin: Secondary | ICD-10-CM | POA: Diagnosis not present

## 2020-09-02 DIAGNOSIS — N186 End stage renal disease: Secondary | ICD-10-CM | POA: Diagnosis not present

## 2020-09-02 DIAGNOSIS — Z992 Dependence on renal dialysis: Secondary | ICD-10-CM | POA: Diagnosis not present

## 2020-09-02 DIAGNOSIS — D631 Anemia in chronic kidney disease: Secondary | ICD-10-CM | POA: Diagnosis not present

## 2020-09-02 DIAGNOSIS — D509 Iron deficiency anemia, unspecified: Secondary | ICD-10-CM | POA: Diagnosis not present

## 2020-09-02 DIAGNOSIS — N2581 Secondary hyperparathyroidism of renal origin: Secondary | ICD-10-CM | POA: Diagnosis not present

## 2020-09-03 DIAGNOSIS — E1122 Type 2 diabetes mellitus with diabetic chronic kidney disease: Secondary | ICD-10-CM | POA: Diagnosis not present

## 2020-09-03 DIAGNOSIS — I871 Compression of vein: Secondary | ICD-10-CM | POA: Diagnosis not present

## 2020-09-03 DIAGNOSIS — N186 End stage renal disease: Secondary | ICD-10-CM | POA: Diagnosis not present

## 2020-09-03 DIAGNOSIS — Z794 Long term (current) use of insulin: Secondary | ICD-10-CM | POA: Diagnosis not present

## 2020-09-03 DIAGNOSIS — Z992 Dependence on renal dialysis: Secondary | ICD-10-CM | POA: Diagnosis not present

## 2020-09-03 DIAGNOSIS — Z720 Tobacco use: Secondary | ICD-10-CM | POA: Diagnosis not present

## 2020-09-03 DIAGNOSIS — T82848A Pain from vascular prosthetic devices, implants and grafts, initial encounter: Secondary | ICD-10-CM | POA: Diagnosis not present

## 2020-09-03 DIAGNOSIS — I120 Hypertensive chronic kidney disease with stage 5 chronic kidney disease or end stage renal disease: Secondary | ICD-10-CM | POA: Diagnosis not present

## 2020-09-03 DIAGNOSIS — T82856A Stenosis of peripheral vascular stent, initial encounter: Secondary | ICD-10-CM | POA: Diagnosis not present

## 2020-09-03 DIAGNOSIS — D631 Anemia in chronic kidney disease: Secondary | ICD-10-CM | POA: Diagnosis not present

## 2020-09-03 DIAGNOSIS — T82898A Other specified complication of vascular prosthetic devices, implants and grafts, initial encounter: Secondary | ICD-10-CM | POA: Diagnosis not present

## 2020-09-04 DIAGNOSIS — Z992 Dependence on renal dialysis: Secondary | ICD-10-CM | POA: Diagnosis not present

## 2020-09-04 DIAGNOSIS — N2581 Secondary hyperparathyroidism of renal origin: Secondary | ICD-10-CM | POA: Diagnosis not present

## 2020-09-04 DIAGNOSIS — D631 Anemia in chronic kidney disease: Secondary | ICD-10-CM | POA: Diagnosis not present

## 2020-09-04 DIAGNOSIS — N186 End stage renal disease: Secondary | ICD-10-CM | POA: Diagnosis not present

## 2020-09-04 DIAGNOSIS — D509 Iron deficiency anemia, unspecified: Secondary | ICD-10-CM | POA: Diagnosis not present

## 2020-09-06 DIAGNOSIS — N2581 Secondary hyperparathyroidism of renal origin: Secondary | ICD-10-CM | POA: Diagnosis not present

## 2020-09-06 DIAGNOSIS — N186 End stage renal disease: Secondary | ICD-10-CM | POA: Diagnosis not present

## 2020-09-06 DIAGNOSIS — Z992 Dependence on renal dialysis: Secondary | ICD-10-CM | POA: Diagnosis not present

## 2020-09-06 DIAGNOSIS — D631 Anemia in chronic kidney disease: Secondary | ICD-10-CM | POA: Diagnosis not present

## 2020-09-06 DIAGNOSIS — D509 Iron deficiency anemia, unspecified: Secondary | ICD-10-CM | POA: Diagnosis not present

## 2020-09-09 ENCOUNTER — Telehealth: Payer: Self-pay | Admitting: *Deleted

## 2020-09-09 DIAGNOSIS — Z992 Dependence on renal dialysis: Secondary | ICD-10-CM | POA: Diagnosis not present

## 2020-09-09 DIAGNOSIS — D631 Anemia in chronic kidney disease: Secondary | ICD-10-CM | POA: Diagnosis not present

## 2020-09-09 DIAGNOSIS — N186 End stage renal disease: Secondary | ICD-10-CM | POA: Diagnosis not present

## 2020-09-09 DIAGNOSIS — N2581 Secondary hyperparathyroidism of renal origin: Secondary | ICD-10-CM | POA: Diagnosis not present

## 2020-09-09 DIAGNOSIS — D509 Iron deficiency anemia, unspecified: Secondary | ICD-10-CM | POA: Diagnosis not present

## 2020-09-10 ENCOUNTER — Ambulatory Visit
Payer: Medicare Other | Attending: Student in an Organized Health Care Education/Training Program | Admitting: Student in an Organized Health Care Education/Training Program

## 2020-09-10 ENCOUNTER — Encounter: Payer: Self-pay | Admitting: Student in an Organized Health Care Education/Training Program

## 2020-09-10 DIAGNOSIS — G894 Chronic pain syndrome: Secondary | ICD-10-CM

## 2020-09-10 DIAGNOSIS — M5412 Radiculopathy, cervical region: Secondary | ICD-10-CM | POA: Diagnosis not present

## 2020-09-10 DIAGNOSIS — E1142 Type 2 diabetes mellitus with diabetic polyneuropathy: Secondary | ICD-10-CM

## 2020-09-10 MED ORDER — OXYCODONE HCL 5 MG PO TABS: 5.0000 mg | ORAL_TABLET | Freq: Two times a day (BID) | ORAL | 0 refills | Status: DC | PRN

## 2020-09-10 NOTE — Progress Notes (Signed)
Patient: [name]  Service Category: E/M  Provider: Gillis Santa, MD  DOB: November 23, 1966  DOS: 09/10/2020  Location: Office  MRN: [id number]  Setting: Ambulatory outpatient  Referring Provider: Nobie Putnam *  Type: Established Patient  Specialty: Interventional Pain Management  PCP: Olin Hauser, DO  Location: Home  Delivery: TeleHealth     Virtual Encounter - Pain Management PROVIDER NOTE: Information contained herein reflects review and annotations entered in association with encounter. Interpretation of such information and data should be left to medically-trained personnel. Information provided to patient can be located elsewhere in the medical record under "Patient Instructions". Document created using STT-dictation technology, any transcriptional errors that may result from process are unintentional.    Contact & Pharmacy Preferred: [phone] Home: [phone] (home) Mobile: [phone] (mobile) E-mail: [email or web address] .[email or web address], [place] [address] [place] [address] Phone: [phone] Fax: [phone]   Pre-screening  [name] offered "in-person" vs "virtual" encounter. She indicated preferring virtual for this encounter.   Reason COVID-19*  Social distancing based on CDC and AMA recommendations.   I contacted [name] on 09/10/2020 via video conference.      I clearly identified myself as Gillis Santa, MD. I verified that I was speaking with the correct person using two identifiers (Name: [name], and date of birth: March 14, 1966).  Consent I sought verbal advanced consent from [name] for virtual visit interactions. I informed [name] of possible security and privacy concerns, risks, and limitations associated with providing "not-in-person" medical evaluation and management services. I also informed [name] of the availability of "in-person" appointments. Finally, I informed her that  there would be a charge for the virtual visit and that she could be  personally, fully or partially, financially responsible for it. [name] expressed understanding and agreed to proceed.   Historic Elements   [name] is a 54 y.o. year old, female patient evaluated today after our last contact on 03/14/2020. [name]  has a past medical history of Allergy, Anemia, Anxiety, Arthritis, Ataxia, COPD (chronic obstructive pulmonary disease) ([place]), Depression, Diabetes mellitus with complication ([place]), Dialysis patient ([place]), ESRD on hemodialysis ([place]), Fistula, Hepatitis (2003), Hiatal hernia, Hypercholesterolemia, Hypertension, Migraine, Neuropathy, diabetic ([place]), Non-alcoholic cirrhosis ([place]), Peripheral vascular disease ([place]), Pneumonia (2015), Psoriasis, Renal insufficiency, Tobacco dependence, and Wears dentures. She also  has a past surgical history that includes Tonsillectomy; rt. tubal and ovary removed; Cholecystectomy; Cardiac catheterization (N/A, 07/25/2015); Cardiac catheterization (Left, 07/25/2015); Cardiac catheterization (Left, 10/07/2015); Cardiac catheterization (N/A, 10/07/2015); Cardiac catheterization (10/07/2015); Cardiac catheterization (N/A, 12/17/2015); Incision and drainage abscess (N/A, 07/16/2016); cyst removed  from left hand (Left, 1989); AV fistula placement (Left, 11/2014); Cardiac catheterization (N/A, 01/11/2017); Cardiac catheterization (N/A, 01/11/2017); DIALYSIS/PERMA CATHETER INSERTION (N/A, 05/20/2017); Cesarean section; Tubal ligation; Colonoscopy (N/A, 09/22/2017); Esophagogastroduodenoscopy (N/A, 09/22/2017); polypectomy (09/22/2017); A/V SHUNT INTERVENTION (N/A, 12/16/2017); STENT PLACEMENT VASCULAR ([facility] HX) (Left, 03/2018); Upper Extremity Angiography (Left, 09/29/2019); Aqueous shunt (Left, 12/20/2019); Colonoscopy with propofol (N/A, 04/16/2020); and A/V Fistulagram (Left, 04/23/2020). [name] has a current medication list which includes the following prescription(s):  humira, atropine, brimonidine, diltiazem, dorzolamidel-timolol, ferric citrate, glucose blood, insulin aspart, insulin glargine, ketoconazole, levocetirizine, midodrine, omeprazole, ondansetron, [START ON 09/18/2020] oxycodone, [START ON 10/18/2020] oxycodone, sevelamer carbonate, and trueplus insulin syringe. She  reports that she has been smoking cigarettes. She has a 5.00 pack-year smoking history. She has never used smokeless tobacco. She reports that she does not  drink alcohol and does not use drugs. [name] is allergic to cinnamon, garlic, onion, tylenol [acetaminophen], ciprofloxacin, and prednisone.   HPI  Today, she is being contacted for medication management.   Grandson + for Covid, has had exposure. Patient states that she is not feeling well either.  She does have symptoms of a URI.  She has end-stage renal disease is on dialysis Monday Wednesday Friday.  She is try to coordinate with dialysis to get Covid tested and figure out where to have her dialysis session. Hence VV for MM given positive COVID-19 exposure and patient being symptomatic, waiting to be tested. Otherwise, Patient's pain is at baseline.  Patient continues  pain regimen as prescribed.  States that it provides pain relief and improvement in functional status.   Pharmacotherapy Assessment  Analgesic: 08/19/2020  1   06/11/2020  Oxycodone Hcl 5 MG Tablet  60.00  30 Bi Lat   [id number]   [place] ([id number])   0/0  15.00 MME  Medicare   [place]     Monitoring: [place] PMP: PDMP reviewed during this encounter.       Pharmacotherapy: No side-effects or adverse reactions reported. Compliance: No problems identified. Effectiveness: Clinically acceptable. Plan: Refer to "POC".  UDS: No results found for: SUMMARY  Laboratory Chemistry Profile   Renal Lab Results  Component Value Date   BUN 52 (H) 05/01/2019   CREATININE 8.2 (A) 07/17/2020   BCR 10 12/27/2017   GFRAA 6 (L) 05/01/2019   GFRNONAA 6 (L) 05/01/2019     Hepatic Lab  Results  Component Value Date   AST 15 07/17/2020   ALT 12 03/26/2020   ALBUMIN 4.0 07/17/2020   ALKPHOS 74 07/17/2020   HCVAB >11.0 (H) 12/16/2016   LIPASE 21 04/30/2019     Electrolytes Lab Results  Component Value Date   NA 137 07/17/2020   K 5.5 (A) 07/17/2020   CL 101 07/17/2020   CALCIUM 7.6 (A) 07/17/2020   MG 2.4 12/14/2017   PHOS 4.2 05/01/2019     Bone No results found for: VD25OH, VD125OH2TOT, VO3500XF8, HW2993ZJ6, 25OHVITD1, 25OHVITD2, 25OHVITD3, TESTOFREE, TESTOSTERONE   Inflammation (CRP: Acute Phase) (ESR: Chronic Phase) Lab Results  Component Value Date   CRP <0.8 12/16/2016   ESRSEDRATE 117 (H) 10/12/2017   LATICACIDVEN 1.6 04/30/2019       Note: Above Lab results reviewed.   Assessment  Diagnoses of Diabetic polyneuropathy associated with type 2 diabetes mellitus ([place]), Cervical radiculopathy, and Chronic pain syndrome were pertinent to this visit.  Plan of Care   [name] has a current medication list which includes the following long-term medication(s): humira, diltiazem, insulin aspart, insulin glargine, and levocetirizine.  Pharmacotherapy (Medications Ordered): Meds ordered this encounter  Medications  . oxyCODONE (OXY IR/ROXICODONE) 5 MG immediate release tablet    Sig: Take 1 tablet (5 mg total) by mouth 2 (two) times daily as needed for severe pain. Must last 30 days.    Dispense:  60 tablet    Refill:  0    Chronic Pain. (STOP Act - Not applicable). Fill one day early if closed on scheduled refill date.  Marland Kitchen oxyCODONE (OXY IR/ROXICODONE) 5 MG immediate release tablet    Sig: Take 1 tablet (5 mg total) by mouth 2 (two) times daily as needed for severe pain. Must last 30 days.    Dispense:  60 tablet    Refill:  0    Chronic Pain. (STOP Act - Not applicable). Fill one day  early if closed on scheduled refill date.  Follow-up plan:   Return in about 8 weeks (around 11/05/2020) for Medication Management, in person.    Recent  Visits No visits were found meeting these conditions. Showing recent visits within past 90 days and meeting all other requirements Today's Visits Date Type Provider Dept  09/10/20 Office Visit Gillis Santa, MD [facility]  Showing today's visits and meeting all other requirements Future Appointments No visits were found meeting these conditions. Showing future appointments within next 90 days and meeting all other requirements  I discussed the assessment and treatment plan with the patient. The patient was provided an opportunity to ask questions and all were answered. The patient agreed with the plan and demonstrated an understanding of the instructions.  Patient advised to call back or seek an in-person evaluation if the symptoms or condition worsens.  Duration of encounter:57mnutes.  Note by: BGillis Santa MD Date: 09/10/2020; Time: 10:50 AM

## 2020-09-11 DIAGNOSIS — D631 Anemia in chronic kidney disease: Secondary | ICD-10-CM | POA: Diagnosis not present

## 2020-09-11 DIAGNOSIS — N186 End stage renal disease: Secondary | ICD-10-CM | POA: Diagnosis not present

## 2020-09-11 DIAGNOSIS — D509 Iron deficiency anemia, unspecified: Secondary | ICD-10-CM | POA: Diagnosis not present

## 2020-09-11 DIAGNOSIS — N2581 Secondary hyperparathyroidism of renal origin: Secondary | ICD-10-CM | POA: Diagnosis not present

## 2020-09-11 DIAGNOSIS — Z992 Dependence on renal dialysis: Secondary | ICD-10-CM | POA: Diagnosis not present

## 2020-09-11 DIAGNOSIS — R197 Diarrhea, unspecified: Secondary | ICD-10-CM | POA: Diagnosis not present

## 2020-09-11 DIAGNOSIS — R5383 Other fatigue: Secondary | ICD-10-CM | POA: Diagnosis not present

## 2020-09-11 DIAGNOSIS — M791 Myalgia, unspecified site: Secondary | ICD-10-CM | POA: Diagnosis not present

## 2020-09-13 DIAGNOSIS — D631 Anemia in chronic kidney disease: Secondary | ICD-10-CM | POA: Diagnosis not present

## 2020-09-13 DIAGNOSIS — D509 Iron deficiency anemia, unspecified: Secondary | ICD-10-CM | POA: Diagnosis not present

## 2020-09-13 DIAGNOSIS — N186 End stage renal disease: Secondary | ICD-10-CM | POA: Diagnosis not present

## 2020-09-13 DIAGNOSIS — N2581 Secondary hyperparathyroidism of renal origin: Secondary | ICD-10-CM | POA: Diagnosis not present

## 2020-09-13 DIAGNOSIS — Z992 Dependence on renal dialysis: Secondary | ICD-10-CM | POA: Diagnosis not present

## 2020-09-16 DIAGNOSIS — Z992 Dependence on renal dialysis: Secondary | ICD-10-CM | POA: Diagnosis not present

## 2020-09-16 DIAGNOSIS — D509 Iron deficiency anemia, unspecified: Secondary | ICD-10-CM | POA: Diagnosis not present

## 2020-09-16 DIAGNOSIS — N186 End stage renal disease: Secondary | ICD-10-CM | POA: Diagnosis not present

## 2020-09-16 DIAGNOSIS — N2581 Secondary hyperparathyroidism of renal origin: Secondary | ICD-10-CM | POA: Diagnosis not present

## 2020-09-16 DIAGNOSIS — D631 Anemia in chronic kidney disease: Secondary | ICD-10-CM | POA: Diagnosis not present

## 2020-09-17 ENCOUNTER — Other Ambulatory Visit: Payer: Self-pay | Admitting: Family Medicine

## 2020-09-17 DIAGNOSIS — L409 Psoriasis, unspecified: Secondary | ICD-10-CM | POA: Diagnosis not present

## 2020-09-17 DIAGNOSIS — M0579 Rheumatoid arthritis with rheumatoid factor of multiple sites without organ or systems involvement: Secondary | ICD-10-CM | POA: Diagnosis not present

## 2020-09-17 DIAGNOSIS — B182 Chronic viral hepatitis C: Secondary | ICD-10-CM | POA: Diagnosis not present

## 2020-09-17 DIAGNOSIS — N186 End stage renal disease: Secondary | ICD-10-CM | POA: Diagnosis not present

## 2020-09-17 DIAGNOSIS — J00 Acute nasopharyngitis [common cold]: Secondary | ICD-10-CM

## 2020-09-17 DIAGNOSIS — L4050 Arthropathic psoriasis, unspecified: Secondary | ICD-10-CM | POA: Diagnosis not present

## 2020-09-17 DIAGNOSIS — Z111 Encounter for screening for respiratory tuberculosis: Secondary | ICD-10-CM | POA: Diagnosis not present

## 2020-09-17 DIAGNOSIS — Z992 Dependence on renal dialysis: Secondary | ICD-10-CM | POA: Diagnosis not present

## 2020-09-17 DIAGNOSIS — Z79899 Other long term (current) drug therapy: Secondary | ICD-10-CM | POA: Diagnosis not present

## 2020-09-17 DIAGNOSIS — E1122 Type 2 diabetes mellitus with diabetic chronic kidney disease: Secondary | ICD-10-CM

## 2020-09-17 NOTE — Telephone Encounter (Signed)
Requested Prescriptions  Pending Prescriptions Disp Refills   levocetirizine (XYZAL) 5 MG tablet [Pharmacy Med Name: levocetirizine 5 mg tablet] 90 tablet 0    Sig: TAKE ONE TABLET BY MOUTH EVERY EVENING     Ear, Nose, and Throat:  Antihistamines Passed - 09/17/2020 11:34 AM      Passed - Valid encounter within last 12 months    Recent Outpatient Visits          1 month ago Type 2 diabetes mellitus with ESRD (end-stage renal disease) ([place])   [place], DO   7 months ago Type 2 diabetes mellitus with ESRD (end-stage renal disease) ([place])   [facility] Olin Hauser, DO   1 year ago Type 2 diabetes mellitus with ESRD (end-stage renal disease) ([place])   [facility] Olin Hauser, DO   1 year ago General medical exam   [place], DO   1 year ago Tinea of groin   [facility] Merrilyn Puma, Jerrel Ivory, NP      Future Appointments            In 1 month [place], [name], [facility], [place] 29G X 1/2" 0.5 ML MISC [Pharmacy Med Name: TRUEplus Insulin 0.5 mL 29 gauge x 1/2" syringe] 120 each 3    Sig: USE 4 TIMES A DAY WITH LANTUS & NOVOLOG     There is no refill protocol information for this order

## 2020-09-18 DIAGNOSIS — N186 End stage renal disease: Secondary | ICD-10-CM | POA: Diagnosis not present

## 2020-09-18 DIAGNOSIS — D631 Anemia in chronic kidney disease: Secondary | ICD-10-CM | POA: Diagnosis not present

## 2020-09-18 DIAGNOSIS — Z992 Dependence on renal dialysis: Secondary | ICD-10-CM | POA: Diagnosis not present

## 2020-09-18 DIAGNOSIS — D509 Iron deficiency anemia, unspecified: Secondary | ICD-10-CM | POA: Diagnosis not present

## 2020-09-18 DIAGNOSIS — N2581 Secondary hyperparathyroidism of renal origin: Secondary | ICD-10-CM | POA: Diagnosis not present

## 2020-09-19 DIAGNOSIS — Z992 Dependence on renal dialysis: Secondary | ICD-10-CM | POA: Diagnosis not present

## 2020-09-19 DIAGNOSIS — N186 End stage renal disease: Secondary | ICD-10-CM | POA: Diagnosis not present

## 2020-09-20 DIAGNOSIS — D631 Anemia in chronic kidney disease: Secondary | ICD-10-CM | POA: Diagnosis not present

## 2020-09-20 DIAGNOSIS — N186 End stage renal disease: Secondary | ICD-10-CM | POA: Diagnosis not present

## 2020-09-20 DIAGNOSIS — Z23 Encounter for immunization: Secondary | ICD-10-CM | POA: Diagnosis not present

## 2020-09-20 DIAGNOSIS — Z992 Dependence on renal dialysis: Secondary | ICD-10-CM | POA: Diagnosis not present

## 2020-09-20 DIAGNOSIS — D509 Iron deficiency anemia, unspecified: Secondary | ICD-10-CM | POA: Diagnosis not present

## 2020-09-20 DIAGNOSIS — N2581 Secondary hyperparathyroidism of renal origin: Secondary | ICD-10-CM | POA: Diagnosis not present

## 2020-09-23 DIAGNOSIS — Z992 Dependence on renal dialysis: Secondary | ICD-10-CM | POA: Diagnosis not present

## 2020-09-23 DIAGNOSIS — N186 End stage renal disease: Secondary | ICD-10-CM | POA: Diagnosis not present

## 2020-09-23 DIAGNOSIS — D631 Anemia in chronic kidney disease: Secondary | ICD-10-CM | POA: Diagnosis not present

## 2020-09-23 DIAGNOSIS — Z23 Encounter for immunization: Secondary | ICD-10-CM | POA: Diagnosis not present

## 2020-09-23 DIAGNOSIS — D509 Iron deficiency anemia, unspecified: Secondary | ICD-10-CM | POA: Diagnosis not present

## 2020-09-23 DIAGNOSIS — N2581 Secondary hyperparathyroidism of renal origin: Secondary | ICD-10-CM | POA: Diagnosis not present

## 2020-09-25 DIAGNOSIS — Z23 Encounter for immunization: Secondary | ICD-10-CM | POA: Diagnosis not present

## 2020-09-25 DIAGNOSIS — N2581 Secondary hyperparathyroidism of renal origin: Secondary | ICD-10-CM | POA: Diagnosis not present

## 2020-09-25 DIAGNOSIS — N186 End stage renal disease: Secondary | ICD-10-CM | POA: Diagnosis not present

## 2020-09-25 DIAGNOSIS — D509 Iron deficiency anemia, unspecified: Secondary | ICD-10-CM | POA: Diagnosis not present

## 2020-09-25 DIAGNOSIS — D631 Anemia in chronic kidney disease: Secondary | ICD-10-CM | POA: Diagnosis not present

## 2020-09-25 DIAGNOSIS — Z992 Dependence on renal dialysis: Secondary | ICD-10-CM | POA: Diagnosis not present

## 2020-09-27 DIAGNOSIS — D631 Anemia in chronic kidney disease: Secondary | ICD-10-CM | POA: Diagnosis not present

## 2020-09-27 DIAGNOSIS — N2581 Secondary hyperparathyroidism of renal origin: Secondary | ICD-10-CM | POA: Diagnosis not present

## 2020-09-27 DIAGNOSIS — Z23 Encounter for immunization: Secondary | ICD-10-CM | POA: Diagnosis not present

## 2020-09-27 DIAGNOSIS — D509 Iron deficiency anemia, unspecified: Secondary | ICD-10-CM | POA: Diagnosis not present

## 2020-09-27 DIAGNOSIS — Z992 Dependence on renal dialysis: Secondary | ICD-10-CM | POA: Diagnosis not present

## 2020-09-27 DIAGNOSIS — N186 End stage renal disease: Secondary | ICD-10-CM | POA: Diagnosis not present

## 2020-09-30 DIAGNOSIS — Z23 Encounter for immunization: Secondary | ICD-10-CM | POA: Diagnosis not present

## 2020-09-30 DIAGNOSIS — D509 Iron deficiency anemia, unspecified: Secondary | ICD-10-CM | POA: Diagnosis not present

## 2020-09-30 DIAGNOSIS — Z992 Dependence on renal dialysis: Secondary | ICD-10-CM | POA: Diagnosis not present

## 2020-09-30 DIAGNOSIS — D631 Anemia in chronic kidney disease: Secondary | ICD-10-CM | POA: Diagnosis not present

## 2020-09-30 DIAGNOSIS — N186 End stage renal disease: Secondary | ICD-10-CM | POA: Diagnosis not present

## 2020-09-30 DIAGNOSIS — N2581 Secondary hyperparathyroidism of renal origin: Secondary | ICD-10-CM | POA: Diagnosis not present

## 2020-10-02 DIAGNOSIS — N186 End stage renal disease: Secondary | ICD-10-CM | POA: Diagnosis not present

## 2020-10-02 DIAGNOSIS — N2581 Secondary hyperparathyroidism of renal origin: Secondary | ICD-10-CM | POA: Diagnosis not present

## 2020-10-02 DIAGNOSIS — R197 Diarrhea, unspecified: Secondary | ICD-10-CM | POA: Diagnosis not present

## 2020-10-02 DIAGNOSIS — D631 Anemia in chronic kidney disease: Secondary | ICD-10-CM | POA: Diagnosis not present

## 2020-10-02 DIAGNOSIS — Z23 Encounter for immunization: Secondary | ICD-10-CM | POA: Diagnosis not present

## 2020-10-02 DIAGNOSIS — R509 Fever, unspecified: Secondary | ICD-10-CM | POA: Diagnosis not present

## 2020-10-02 DIAGNOSIS — Z992 Dependence on renal dialysis: Secondary | ICD-10-CM | POA: Diagnosis not present

## 2020-10-02 DIAGNOSIS — D509 Iron deficiency anemia, unspecified: Secondary | ICD-10-CM | POA: Diagnosis not present

## 2020-10-02 DIAGNOSIS — R110 Nausea: Secondary | ICD-10-CM | POA: Diagnosis not present

## 2020-10-04 DIAGNOSIS — D631 Anemia in chronic kidney disease: Secondary | ICD-10-CM | POA: Diagnosis not present

## 2020-10-04 DIAGNOSIS — Z23 Encounter for immunization: Secondary | ICD-10-CM | POA: Diagnosis not present

## 2020-10-04 DIAGNOSIS — D509 Iron deficiency anemia, unspecified: Secondary | ICD-10-CM | POA: Diagnosis not present

## 2020-10-04 DIAGNOSIS — N186 End stage renal disease: Secondary | ICD-10-CM | POA: Diagnosis not present

## 2020-10-04 DIAGNOSIS — N2581 Secondary hyperparathyroidism of renal origin: Secondary | ICD-10-CM | POA: Diagnosis not present

## 2020-10-04 DIAGNOSIS — Z992 Dependence on renal dialysis: Secondary | ICD-10-CM | POA: Diagnosis not present

## 2020-10-07 DIAGNOSIS — Z992 Dependence on renal dialysis: Secondary | ICD-10-CM | POA: Diagnosis not present

## 2020-10-07 DIAGNOSIS — N186 End stage renal disease: Secondary | ICD-10-CM | POA: Diagnosis not present

## 2020-10-07 DIAGNOSIS — D509 Iron deficiency anemia, unspecified: Secondary | ICD-10-CM | POA: Diagnosis not present

## 2020-10-07 DIAGNOSIS — Z23 Encounter for immunization: Secondary | ICD-10-CM | POA: Diagnosis not present

## 2020-10-07 DIAGNOSIS — N2581 Secondary hyperparathyroidism of renal origin: Secondary | ICD-10-CM | POA: Diagnosis not present

## 2020-10-07 DIAGNOSIS — D631 Anemia in chronic kidney disease: Secondary | ICD-10-CM | POA: Diagnosis not present

## 2020-10-08 DIAGNOSIS — L97521 Non-pressure chronic ulcer of other part of left foot limited to breakdown of skin: Secondary | ICD-10-CM | POA: Diagnosis not present

## 2020-10-08 DIAGNOSIS — E1140 Type 2 diabetes mellitus with diabetic neuropathy, unspecified: Secondary | ICD-10-CM | POA: Diagnosis not present

## 2020-10-08 DIAGNOSIS — Z794 Long term (current) use of insulin: Secondary | ICD-10-CM | POA: Diagnosis not present

## 2020-10-09 DIAGNOSIS — Z992 Dependence on renal dialysis: Secondary | ICD-10-CM | POA: Diagnosis not present

## 2020-10-09 DIAGNOSIS — N2581 Secondary hyperparathyroidism of renal origin: Secondary | ICD-10-CM | POA: Diagnosis not present

## 2020-10-09 DIAGNOSIS — D509 Iron deficiency anemia, unspecified: Secondary | ICD-10-CM | POA: Diagnosis not present

## 2020-10-09 DIAGNOSIS — N186 End stage renal disease: Secondary | ICD-10-CM | POA: Diagnosis not present

## 2020-10-09 DIAGNOSIS — D631 Anemia in chronic kidney disease: Secondary | ICD-10-CM | POA: Diagnosis not present

## 2020-10-09 DIAGNOSIS — Z23 Encounter for immunization: Secondary | ICD-10-CM | POA: Diagnosis not present

## 2020-10-11 DIAGNOSIS — N2581 Secondary hyperparathyroidism of renal origin: Secondary | ICD-10-CM | POA: Diagnosis not present

## 2020-10-11 DIAGNOSIS — Z23 Encounter for immunization: Secondary | ICD-10-CM | POA: Diagnosis not present

## 2020-10-11 DIAGNOSIS — N186 End stage renal disease: Secondary | ICD-10-CM | POA: Diagnosis not present

## 2020-10-11 DIAGNOSIS — D509 Iron deficiency anemia, unspecified: Secondary | ICD-10-CM | POA: Diagnosis not present

## 2020-10-11 DIAGNOSIS — D631 Anemia in chronic kidney disease: Secondary | ICD-10-CM | POA: Diagnosis not present

## 2020-10-11 DIAGNOSIS — Z992 Dependence on renal dialysis: Secondary | ICD-10-CM | POA: Diagnosis not present

## 2020-10-14 DIAGNOSIS — N2581 Secondary hyperparathyroidism of renal origin: Secondary | ICD-10-CM | POA: Diagnosis not present

## 2020-10-14 DIAGNOSIS — N186 End stage renal disease: Secondary | ICD-10-CM | POA: Diagnosis not present

## 2020-10-14 DIAGNOSIS — D631 Anemia in chronic kidney disease: Secondary | ICD-10-CM | POA: Diagnosis not present

## 2020-10-14 DIAGNOSIS — D509 Iron deficiency anemia, unspecified: Secondary | ICD-10-CM | POA: Diagnosis not present

## 2020-10-14 DIAGNOSIS — Z794 Long term (current) use of insulin: Secondary | ICD-10-CM | POA: Diagnosis not present

## 2020-10-14 DIAGNOSIS — Z992 Dependence on renal dialysis: Secondary | ICD-10-CM | POA: Diagnosis not present

## 2020-10-14 DIAGNOSIS — Z23 Encounter for immunization: Secondary | ICD-10-CM | POA: Diagnosis not present

## 2020-10-14 DIAGNOSIS — E119 Type 2 diabetes mellitus without complications: Secondary | ICD-10-CM | POA: Diagnosis not present

## 2020-10-15 DIAGNOSIS — E113512 Type 2 diabetes mellitus with proliferative diabetic retinopathy with macular edema, left eye: Secondary | ICD-10-CM | POA: Diagnosis not present

## 2020-10-15 DIAGNOSIS — H4052X0 Glaucoma secondary to other eye disorders, left eye, stage unspecified: Secondary | ICD-10-CM | POA: Diagnosis not present

## 2020-10-16 ENCOUNTER — Other Ambulatory Visit: Payer: Self-pay | Admitting: Family Medicine

## 2020-10-16 DIAGNOSIS — Z992 Dependence on renal dialysis: Secondary | ICD-10-CM | POA: Diagnosis not present

## 2020-10-16 DIAGNOSIS — I10 Essential (primary) hypertension: Secondary | ICD-10-CM

## 2020-10-16 DIAGNOSIS — Z23 Encounter for immunization: Secondary | ICD-10-CM | POA: Diagnosis not present

## 2020-10-16 DIAGNOSIS — D509 Iron deficiency anemia, unspecified: Secondary | ICD-10-CM | POA: Diagnosis not present

## 2020-10-16 DIAGNOSIS — N2581 Secondary hyperparathyroidism of renal origin: Secondary | ICD-10-CM | POA: Diagnosis not present

## 2020-10-16 DIAGNOSIS — D631 Anemia in chronic kidney disease: Secondary | ICD-10-CM | POA: Diagnosis not present

## 2020-10-16 DIAGNOSIS — N186 End stage renal disease: Secondary | ICD-10-CM | POA: Diagnosis not present

## 2020-10-16 NOTE — Telephone Encounter (Signed)
Requested Prescriptions  Pending Prescriptions Disp Refills   diltiazem (CARDIZEM) 30 MG tablet [Pharmacy Med Name: diltiazem 30 mg tablet] 90 tablet 2    Sig: TAKE 1 TAB BY MOUTH 3 TIMES A DAY. MAY REDUCE DOSE TO 1/2 TAB AS PREFERRED DAY BEFORE HEMODIALYSIS. SKIP DOSE MORNING OF HD     Cardiovascular:  Calcium Channel Blockers Failed - 10/16/2020 10:31 AM      Failed - Last BP in normal range    BP Readings from Last 1 Encounters:  07/30/20 (!) 181/54         Passed - Valid encounter within last 6 months    Recent Outpatient Visits          2 months ago Type 2 diabetes mellitus with ESRD (end-stage renal disease) ([place])   [place], DO   8 months ago Type 2 diabetes mellitus with ESRD (end-stage renal disease) ([place])   [facility] Olin Hauser, DO   1 year ago Type 2 diabetes mellitus with ESRD (end-stage renal disease) ([place])   [facility] Olin Hauser, DO   1 year ago General medical exam   [facility] Olin Hauser, DO   1 year ago Tinea of groin   [facility] Merrilyn Puma, Jerrel Ivory, NP      Future Appointments            In 2 weeks Parks Ranger, [name], [facility], [facility]

## 2020-10-18 DIAGNOSIS — N2581 Secondary hyperparathyroidism of renal origin: Secondary | ICD-10-CM | POA: Diagnosis not present

## 2020-10-18 DIAGNOSIS — Z23 Encounter for immunization: Secondary | ICD-10-CM | POA: Diagnosis not present

## 2020-10-18 DIAGNOSIS — D509 Iron deficiency anemia, unspecified: Secondary | ICD-10-CM | POA: Diagnosis not present

## 2020-10-18 DIAGNOSIS — D631 Anemia in chronic kidney disease: Secondary | ICD-10-CM | POA: Diagnosis not present

## 2020-10-18 DIAGNOSIS — N186 End stage renal disease: Secondary | ICD-10-CM | POA: Diagnosis not present

## 2020-10-18 DIAGNOSIS — Z992 Dependence on renal dialysis: Secondary | ICD-10-CM | POA: Diagnosis not present

## 2020-10-20 DIAGNOSIS — Z992 Dependence on renal dialysis: Secondary | ICD-10-CM | POA: Diagnosis not present

## 2020-10-20 DIAGNOSIS — N186 End stage renal disease: Secondary | ICD-10-CM | POA: Diagnosis not present

## 2020-10-21 DIAGNOSIS — N2581 Secondary hyperparathyroidism of renal origin: Secondary | ICD-10-CM | POA: Diagnosis not present

## 2020-10-21 DIAGNOSIS — Z992 Dependence on renal dialysis: Secondary | ICD-10-CM | POA: Diagnosis not present

## 2020-10-21 DIAGNOSIS — N186 End stage renal disease: Secondary | ICD-10-CM | POA: Diagnosis not present

## 2020-10-21 DIAGNOSIS — D509 Iron deficiency anemia, unspecified: Secondary | ICD-10-CM | POA: Diagnosis not present

## 2020-10-22 DIAGNOSIS — H26492 Other secondary cataract, left eye: Secondary | ICD-10-CM | POA: Diagnosis not present

## 2020-10-23 ENCOUNTER — Telehealth: Payer: Self-pay | Admitting: Gastroenterology

## 2020-10-23 DIAGNOSIS — N186 End stage renal disease: Secondary | ICD-10-CM | POA: Diagnosis not present

## 2020-10-23 DIAGNOSIS — D509 Iron deficiency anemia, unspecified: Secondary | ICD-10-CM | POA: Diagnosis not present

## 2020-10-23 DIAGNOSIS — Z992 Dependence on renal dialysis: Secondary | ICD-10-CM | POA: Diagnosis not present

## 2020-10-23 DIAGNOSIS — N2581 Secondary hyperparathyroidism of renal origin: Secondary | ICD-10-CM | POA: Diagnosis not present

## 2020-10-23 NOTE — Telephone Encounter (Signed)
Called patient to schedule an appt( per PCP from referral). Patient stated that she was suppose to get back about having a test done pertaining to her esophageal spasms and she stated she never heard back from [facility]. Per patient. Clinical staff was informed and will follow up with patient.

## 2020-10-25 DIAGNOSIS — N186 End stage renal disease: Secondary | ICD-10-CM | POA: Diagnosis not present

## 2020-10-25 DIAGNOSIS — Z992 Dependence on renal dialysis: Secondary | ICD-10-CM | POA: Diagnosis not present

## 2020-10-25 DIAGNOSIS — N2581 Secondary hyperparathyroidism of renal origin: Secondary | ICD-10-CM | POA: Diagnosis not present

## 2020-10-25 DIAGNOSIS — D509 Iron deficiency anemia, unspecified: Secondary | ICD-10-CM | POA: Diagnosis not present

## 2020-10-28 DIAGNOSIS — D509 Iron deficiency anemia, unspecified: Secondary | ICD-10-CM | POA: Diagnosis not present

## 2020-10-28 DIAGNOSIS — Z992 Dependence on renal dialysis: Secondary | ICD-10-CM | POA: Diagnosis not present

## 2020-10-28 DIAGNOSIS — N186 End stage renal disease: Secondary | ICD-10-CM | POA: Diagnosis not present

## 2020-10-28 DIAGNOSIS — N2581 Secondary hyperparathyroidism of renal origin: Secondary | ICD-10-CM | POA: Diagnosis not present

## 2020-10-29 DIAGNOSIS — Z794 Long term (current) use of insulin: Secondary | ICD-10-CM | POA: Diagnosis not present

## 2020-10-29 DIAGNOSIS — L97521 Non-pressure chronic ulcer of other part of left foot limited to breakdown of skin: Secondary | ICD-10-CM | POA: Diagnosis not present

## 2020-10-29 DIAGNOSIS — E1140 Type 2 diabetes mellitus with diabetic neuropathy, unspecified: Secondary | ICD-10-CM | POA: Diagnosis not present

## 2020-10-30 DIAGNOSIS — Z992 Dependence on renal dialysis: Secondary | ICD-10-CM | POA: Diagnosis not present

## 2020-10-30 DIAGNOSIS — N186 End stage renal disease: Secondary | ICD-10-CM | POA: Diagnosis not present

## 2020-10-30 DIAGNOSIS — D509 Iron deficiency anemia, unspecified: Secondary | ICD-10-CM | POA: Diagnosis not present

## 2020-10-30 DIAGNOSIS — N2581 Secondary hyperparathyroidism of renal origin: Secondary | ICD-10-CM | POA: Diagnosis not present

## 2020-11-01 DIAGNOSIS — N2581 Secondary hyperparathyroidism of renal origin: Secondary | ICD-10-CM | POA: Diagnosis not present

## 2020-11-01 DIAGNOSIS — N186 End stage renal disease: Secondary | ICD-10-CM | POA: Diagnosis not present

## 2020-11-01 DIAGNOSIS — D509 Iron deficiency anemia, unspecified: Secondary | ICD-10-CM | POA: Diagnosis not present

## 2020-11-01 DIAGNOSIS — Z992 Dependence on renal dialysis: Secondary | ICD-10-CM | POA: Diagnosis not present

## 2020-11-04 DIAGNOSIS — D509 Iron deficiency anemia, unspecified: Secondary | ICD-10-CM | POA: Diagnosis not present

## 2020-11-04 DIAGNOSIS — Z992 Dependence on renal dialysis: Secondary | ICD-10-CM | POA: Diagnosis not present

## 2020-11-04 DIAGNOSIS — N186 End stage renal disease: Secondary | ICD-10-CM | POA: Diagnosis not present

## 2020-11-04 DIAGNOSIS — N2581 Secondary hyperparathyroidism of renal origin: Secondary | ICD-10-CM | POA: Diagnosis not present

## 2020-11-05 ENCOUNTER — Ambulatory Visit
Payer: Medicare Other | Attending: Student in an Organized Health Care Education/Training Program | Admitting: Student in an Organized Health Care Education/Training Program

## 2020-11-05 ENCOUNTER — Encounter: Payer: Self-pay | Admitting: Student in an Organized Health Care Education/Training Program

## 2020-11-05 ENCOUNTER — Ambulatory Visit: Payer: Medicare Other | Admitting: Family Medicine

## 2020-11-05 ENCOUNTER — Other Ambulatory Visit: Payer: Self-pay

## 2020-11-05 VITALS — BP 194/77 | HR 72 | Temp 97.8°F | Resp 18 | Ht 67.0 in | Wt 240.0 lb

## 2020-11-05 DIAGNOSIS — M5416 Radiculopathy, lumbar region: Secondary | ICD-10-CM | POA: Diagnosis not present

## 2020-11-05 DIAGNOSIS — M65342 Trigger finger, left ring finger: Secondary | ICD-10-CM | POA: Diagnosis not present

## 2020-11-05 DIAGNOSIS — G894 Chronic pain syndrome: Secondary | ICD-10-CM

## 2020-11-05 DIAGNOSIS — E1142 Type 2 diabetes mellitus with diabetic polyneuropathy: Secondary | ICD-10-CM | POA: Insufficient documentation

## 2020-11-05 DIAGNOSIS — M47816 Spondylosis without myelopathy or radiculopathy, lumbar region: Secondary | ICD-10-CM | POA: Insufficient documentation

## 2020-11-05 DIAGNOSIS — M47812 Spondylosis without myelopathy or radiculopathy, cervical region: Secondary | ICD-10-CM | POA: Insufficient documentation

## 2020-11-05 DIAGNOSIS — M5030 Other cervical disc degeneration, unspecified cervical region: Secondary | ICD-10-CM | POA: Insufficient documentation

## 2020-11-05 DIAGNOSIS — M5412 Radiculopathy, cervical region: Secondary | ICD-10-CM | POA: Diagnosis not present

## 2020-11-05 MED ORDER — OXYCODONE HCL 5 MG PO TABS: 5.0000 mg | ORAL_TABLET | Freq: Two times a day (BID) | ORAL | 0 refills | Status: DC | PRN

## 2020-11-05 NOTE — Progress Notes (Signed)
Safety precautions to be maintained throughout the outpatient stay will include: orient to surroundings, keep bed in low position, maintain call bell within reach at all times, provide assistance with transfer out of bed and ambulation.   Nursing Pain Medication Assessment:  Safety precautions to be maintained throughout the outpatient stay will include: orient to surroundings, keep bed in low position, maintain call bell within reach at all times, provide assistance with transfer out of bed and ambulation.  Medication Inspection Compliance: [name] did not comply with our request to bring her pills to be counted. She was reminded that bringing the medication bottles, even when empty, is a requirement.  Medication: None brought in. Pill/Patch Count: None available to be counted. Bottle Appearance: No container available. Did not bring bottle(s) to appointment. Filled Date: N/A Last Medication intake:  Yesterday

## 2020-11-05 NOTE — Progress Notes (Signed)
PROVIDER NOTE: Information contained herein reflects review and annotations entered in association with encounter. Interpretation of such information and data should be left to medically-trained personnel. Information provided to patient can be located elsewhere in the medical record under "Patient Instructions". Document created using STT-dictation technology, any transcriptional errors that may result from process are unintentional.    Patient: [name]  Service Category: E/M  Provider: Gillis Santa, MD  DOB: 17-Sep-1966  DOS: 11/05/2020  Specialty: Interventional Pain Management  MRN: [id number]  Setting: Ambulatory outpatient  PCP: Olin Hauser, DO  Type: Established Patient    Referring Provider: Nobie Putnam *  Location: Office  Delivery: Face-to-face     HPI  [name], a 54 y.o. year old female, is here today because of her Chronic pain syndrome [G89.4]. Ms. Schmuhl primary complain today is Medication Refill Last encounter: My last encounter with her was on 09/10/2020. Pertinent problems: Ms. Medici has ESRD (end stage renal disease) on dialysis [facility]); Type 2 diabetes mellitus with ESRD (end-stage renal disease) ([place]); Chronic bilateral low back pain; DDD (degenerative disc disease), cervical; Spondylosis of cervical region without myelopathy or radiculopathy; PAD (peripheral artery disease) ([place]); Primary osteoarthritis of right knee; Chronic pain syndrome; Diabetic polyneuropathy associated with type 2 diabetes mellitus ([place]); Cervical radiculopathy; Lumbar radiculopathy; GAD (generalized anxiety disorder); and Major depressive disorder, recurrent, moderate (HCC) on their pertinent problem list. Pain Assessment: Severity of Chronic pain is reported as a 7 /10. Location: Back Lower/radiates into hips bilateral. Onset: More than a month ago. Quality: Constant. Timing: Constant. Modifying factor(s): sitting, medications help some. Vitals:  height is $RemoveB'5\' 7"'EbOKLxLO$  (1.702 m)  and weight is 240 lb (108.9 kg). Her temperature is 97.8 F (36.6 C). Her blood pressure is 194/77 (abnormal) and her pulse is 72. Her respiration is 18 and oxygen saturation is 100%.   Reason for encounter: medication management.  Overall, no significant change in her medical history.  Patient has an upcoming appointment with GI to further evaluate her chronic diarrhea.  She continues with dialysis.  Today we will refill her oxycodone as below which does provide her with analgesic benefit and improvement in functional status.  Pharmacotherapy Assessment   10/18/2020  09/10/2020   1  Oxycodone Hcl 5 Mg Tablet 60.00  30  Bi Lat  [id number]  [place] ([id number])  0/0  15.00 MME  Medicare  [place]      Analgesic: Oxycodone 5 mg twice daily as needed, quantity 60/month; MME equals 15    Monitoring:  PMP: PDMP reviewed during this encounter.       Pharmacotherapy: No side-effects or adverse reactions reported. Compliance: No problems identified. Effectiveness: Clinically acceptable.  Janne Napoleon, RN  11/05/2020  1:53 PM  Sign when Signing Visit Safety precautions to be maintained throughout the outpatient stay will include: orient to surroundings, keep bed in low position, maintain call bell within reach at all times, provide assistance with transfer out of bed and ambulation.   Nursing Pain Medication Assessment:  Safety precautions to be maintained throughout the outpatient stay will include: orient to surroundings, keep bed in low position, maintain call bell within reach at all times, provide assistance with transfer out of bed and ambulation.  Medication Inspection Compliance: Ms. Robison did not comply with our request to bring her pills to be counted. She was reminded that bringing the medication bottles, even when empty, is a requirement.  Medication: None brought in. Pill/Patch Count: None available to be counted. Bottle  Appearance: No container available. Did not bring bottle(s) to  appointment. Filled Date: N/A Last Medication intake:  Yesterday    UDS: No results found for: SUMMARY will obtain serum tox screen today for medication compliance and monitoring   ROS  Constitutional: Denies any fever or chills Gastrointestinal: No reported hemesis, hematochezia, vomiting, or acute GI distress Musculoskeletal: Low back pain Neurological: No reported episodes of acute onset apraxia, aphasia, dysarthria, agnosia, amnesia, paralysis, loss of coordination, or loss of consciousness  Medication Review  Adalimumab, INSULIN SYRINGE .5CC/29G, atropine, brimonidine, diltiazem, dorzolamidel-timolol, ferric citrate, glucose blood, insulin aspart, insulin glargine, ketoconazole, levocetirizine, midodrine, omeprazole, ondansetron, oxyCODONE, and sevelamer carbonate  History Review  Allergy: Ms. Kirley is allergic to cinnamon, garlic, onion, tylenol [acetaminophen], ciprofloxacin, and prednisone. Drug: Ms. Mucha  reports no history of drug use. Alcohol:  reports no history of alcohol use. Tobacco:  reports that she has been smoking cigarettes. She has a 5.00 pack-year smoking history. She has never used smokeless tobacco. Social: Ms. Leever  reports that she has been smoking cigarettes. She has a 5.00 pack-year smoking history. She has never used smokeless tobacco. She reports that she does not drink alcohol and does not use drugs. Medical:  has a past medical history of Allergy, Anemia, Anxiety, Arthritis, Ataxia, COPD (chronic obstructive pulmonary disease) ([place]), Depression, Diabetes mellitus with complication ([place]), Dialysis patient ([place]), ESRD on hemodialysis ([place]), Fistula, Hepatitis (2003), Hiatal hernia, Hypercholesterolemia, Hypertension, Migraine, Neuropathy, diabetic ([place]), Non-alcoholic cirrhosis ([place]), Peripheral vascular disease ([place]), Pneumonia (2015), Psoriasis, Renal insufficiency, Tobacco dependence, and Wears dentures. Surgical: Ms. Shira  has a past surgical history that  includes Tonsillectomy; rt. tubal and ovary removed; Cholecystectomy; Cardiac catheterization (N/A, 07/25/2015); Cardiac catheterization (Left, 07/25/2015); Cardiac catheterization (Left, 10/07/2015); Cardiac catheterization (N/A, 10/07/2015); Cardiac catheterization (10/07/2015); Cardiac catheterization (N/A, 12/17/2015); Incision and drainage abscess (N/A, 07/16/2016); cyst removed  from left hand (Left, 1989); AV fistula placement (Left, 11/2014); Cardiac catheterization (N/A, 01/11/2017); Cardiac catheterization (N/A, 01/11/2017); DIALYSIS/PERMA CATHETER INSERTION (N/A, 05/20/2017); Cesarean section; Tubal ligation; Colonoscopy (N/A, 09/22/2017); Esophagogastroduodenoscopy (N/A, 09/22/2017); polypectomy (09/22/2017); A/V SHUNT INTERVENTION (N/A, 12/16/2017); STENT PLACEMENT VASCULAR ([facility] HX) (Left, 03/2018); Upper Extremity Angiography (Left, 09/29/2019); Aqueous shunt (Left, 12/20/2019); Colonoscopy with propofol (N/A, 04/16/2020); and A/V Fistulagram (Left, 04/23/2020). Family: family history includes Heart disease in her mother; Hypertension in her father and mother; Skin cancer in her father.  Laboratory Chemistry Profile   Renal Lab Results  Component Value Date   BUN 52 (H) 05/01/2019   CREATININE 8.2 (A) 07/17/2020   BCR 10 12/27/2017   GFRAA 6 (L) 05/01/2019   GFRNONAA 6 (L) 05/01/2019     Hepatic Lab Results  Component Value Date   AST 15 07/17/2020   ALT 12 03/26/2020   ALBUMIN 4.0 07/17/2020   ALKPHOS 74 07/17/2020   HCVAB >11.0 (H) 12/16/2016   LIPASE 21 04/30/2019     Electrolytes Lab Results  Component Value Date   NA 137 07/17/2020   K 5.5 (A) 07/17/2020   CL 101 07/17/2020   CALCIUM 7.6 (A) 07/17/2020   MG 2.4 12/14/2017   PHOS 4.2 05/01/2019     Bone No results found for: VD25OH, VD125OH2TOT, PP5093OI7, TI4580DX8, 25OHVITD1, 25OHVITD2, 25OHVITD3, TESTOFREE, TESTOSTERONE   Inflammation (CRP: Acute Phase) (ESR: Chronic Phase) Lab Results  Component Value Date   CRP <0.8  12/16/2016   ESRSEDRATE 117 (H) 10/12/2017   LATICACIDVEN 1.6 04/30/2019       Note: Above Lab results reviewed.   Physical Exam  General appearance: Well  nourished, well developed, and well hydrated. In no apparent acute distress Mental status: Alert, oriented x 3 (person, place, & time)       Respiratory: No evidence of acute respiratory distress Eyes: PERLA Vitals: BP (!) 194/77   Pulse 72   Temp 97.8 F (36.6 C)   Resp 18   Ht $R'5\' 7"'SE$  (1.702 m)   Wt 240 lb (108.9 kg)   SpO2 100%   BMI 37.59 kg/m  BMI: Estimated body mass index is 37.59 kg/m as calculated from the following:   Height as of this encounter: $RemoveBeforeD'5\' 7"'qXLrWKmVkALIvm$  (1.702 m).   Weight as of this encounter: 240 lb (108.9 kg). Ideal: Ideal body weight: 61.6 kg (135 lb 12.9 oz) Adjusted ideal body weight: 80.5 kg (177 lb 7.7 oz)  Cervical Spine Area Exam  Skin & Axial Inspection: No masses, redness, edema, swelling, or associated skin lesions Alignment: Symmetrical Functional ROM: Decreased ROM      Stability: No instability detected Muscle Tone/Strength: Functionally intact. No obvious neuro-muscular anomalies detected. Sensory (Neurological): Dermatomal pain pattern C5, C6 Palpation: No palpable anomalies             Positive Spurling's on right       Upper Extremity (UE) Exam    Side: Right upper extremity  Side: Left upper extremity   Skin & Extremity Inspection: Skin color, temperature, and hair growth are WNL. No peripheral edema or cyanosis. No masses, redness, swelling, asymmetry, or associated skin lesions. No contractures.  Skin & Extremity Inspection: Skin color, temperature, and hair growth are WNL. No peripheral edema or cyanosis. No masses, redness, swelling, asymmetry, or associated skin lesions. No contractures.   Functional ROM: Pain restricted ROM for shoulder and elbow  Functional ROM: Unrestricted ROM           Muscle Tone/Strength: Functionally intact. No obvious neuro-muscular anomalies detected.   Muscle Tone/Strength: Functionally intact. No obvious neuro-muscular anomalies detected.   Sensory (Neurological): Dermatomal pain pattern          Sensory (Neurological): Unimpaired           Palpation: No palpable anomalies              Palpation: No palpable anomalies               Provocative Test(s):  Phalen's test: deferred Tinel's test: deferred Apley's scratch test (touch opposite shoulder):  Action 1 (Across chest): Decreased ROM Action 2 (Overhead): Decreased ROM Action 3 (LB reach): Decreased ROM   Provocative Test(s):  Phalen's test: deferred Tinel's test: deferred Apley's scratch test (touch opposite shoulder):  Action 1 (Across chest): deferred Action 2 (Overhead): deferred Action 3 (LB reach): deferred     Thoracic Spine Area Exam  Skin & Axial Inspection: No masses, redness, or swelling Alignment: Symmetrical Functional ROM: Unrestricted ROM Stability: No instability detected Muscle Tone/Strength: Functionally intact. No obvious neuro-muscular anomalies detected. Sensory (Neurological): Unimpaired Muscle strength & Tone: No palpable anomalies  Lumbar Spine Area Exam  Skin & Axial Inspection: No masses, redness, or swelling Alignment: Symmetrical Functional ROM: Pain restricted ROM       Stability: No instability detected Muscle Tone/Strength: Functionally intact. No obvious neuro-muscular anomalies detected. Sensory (Neurological): Musculoskeletal pain pattern Palpation: No palpable anomalies       Provocative Tests: Hyperextension/rotation test: (+) due to pain. Lumbar quadrant test (Kemp's test): deferred today       Lateral bending test: deferred today       Patrick's Maneuver: deferred today  FABER* test: deferred today                   S-I anterior distraction/compression test: deferred today         S-I lateral compression test: deferred today         S-I Thigh-thrust test: deferred today         S-I Gaenslen's test: deferred  today         *(Flexion, ABduction and External Rotation)  Gait & Posture Assessment  Ambulation: Unassisted Gait: Relatively normal for age and body habitus Posture: WNL   Lower Extremity Exam    Side: Right lower extremity  Side: Left lower extremity  Stability: No instability observed          Stability: No instability observed          Skin & Extremity Inspection: Skin color, temperature, and hair growth are WNL. No peripheral edema or cyanosis. No masses, redness, swelling, asymmetry, or associated skin lesions. No contractures.  Skin & Extremity Inspection: Skin color, temperature, and hair growth are WNL. No peripheral edema or cyanosis. No masses, redness, swelling, asymmetry, or associated skin lesions. No contractures.  Functional ROM: Unrestricted ROM                  Functional ROM: Unrestricted ROM                  Muscle Tone/Strength: Functionally intact. No obvious neuro-muscular anomalies detected.  Muscle Tone/Strength: Functionally intact. No obvious neuro-muscular anomalies detected.  Sensory (Neurological): Unimpaired        Sensory (Neurological): Unimpaired        DTR: Patellar: deferred today Achilles: deferred today Plantar: deferred today  DTR: Patellar: deferred today Achilles: deferred today Plantar: deferred today  Palpation: No palpable anomalies  Palpation: No palpable anomalies     Assessment   Status Diagnosis  Controlled Controlled Controlled 1. Chronic pain syndrome   2. Diabetic polyneuropathy associated with type 2 diabetes mellitus (HCC)   3. Cervical radiculopathy   4. Trigger finger, left ring finger   5. DDD (degenerative disc disease), cervical   6. Spondylosis of cervical region without myelopathy or radiculopathy   7. Lumbar radiculopathy   8. Lumbar spondylosis      Updated Problems: Problem  Gad (Generalized Anxiety Disorder)  Major Depressive Disorder, Recurrent, Moderate (Hcc)  Diabetic Polyneuropathy  Associated With Type 2 Diabetes Mellitus (Hcc)  Cervical Radiculopathy  Lumbar Radiculopathy  Primary Osteoarthritis of Right Knee  Chronic Pain Syndrome  Pad (Peripheral Artery Disease) (Hcc)  Spondylosis of Cervical Region Without Myelopathy Or Radiculopathy  Ddd (Degenerative Disc Disease), Cervical  Chronic Bilateral Low Back Pain  Type 2 Diabetes Mellitus With Esrd (End-Stage Renal Disease) (Hcc)  Esrd (End Stage Renal Disease) On Dialysis (Hcc)    Plan of Care   [name] has a current medication list which includes the following long-term medication(s): humira, diltiazem, insulin aspart, insulin glargine, levocetirizine, [START ON 11/17/2020] oxycodone, [START ON 12/17/2020] oxycodone, and [START ON 01/16/2021] oxycodone.  Pharmacotherapy (Medications Ordered): Meds ordered this encounter  Medications  . oxyCODONE (OXY IR/ROXICODONE) 5 MG immediate release tablet    Sig: Take 1 tablet (5 mg total) by mouth 2 (two) times daily as needed for severe pain. Must last 30 days.    Dispense:  60 tablet    Refill:  0    Chronic Pain. (STOP Act - Not applicable). Fill one day early if closed on scheduled refill  date.  . oxyCODONE (OXY IR/ROXICODONE) 5 MG immediate release tablet    Sig: Take 1 tablet (5 mg total) by mouth 2 (two) times daily as needed for severe pain. Must last 30 days.    Dispense:  60 tablet    Refill:  0    Chronic Pain. (STOP Act - Not applicable). Fill one day early if closed on scheduled refill date.  Marland Kitchen oxyCODONE (OXY IR/ROXICODONE) 5 MG immediate release tablet    Sig: Take 1 tablet (5 mg total) by mouth 2 (two) times daily as needed for severe pain. Must last 30 days.    Dispense:  60 tablet    Refill:  0    Chronic Pain. (STOP Act - Not applicable). Fill one day early if closed on scheduled refill date.   Orders:  Orders Placed This Encounter  Procedures  . Drug Screen 10 W/Conf, Serum    Order Specific Question:   Release to patient    Answer:    Immediate   Follow-up plan:   Return in about 3 months (around 02/05/2021) for Medication Management, in person.   Recent Visits Date Type Provider Dept  09/10/20 Office Visit Gillis Santa, MD [facility]  Showing recent visits within past 90 days and meeting all other requirements Today's Visits Date Type Provider Dept  11/05/20 Office Visit Gillis Santa, MD [facility]  Showing today's visits and meeting all other requirements Future Appointments No visits were found meeting these conditions. Showing future appointments within next 90 days and meeting all other requirements  I discussed the assessment and treatment plan with the patient. The patient was provided an opportunity to ask questions and all were answered. The patient agreed with the plan and demonstrated an understanding of the instructions.  Patient advised to call back or seek an in-person evaluation if the symptoms or condition worsens.  Duration of encounter: 30 minutes.  Note by: Gillis Santa, MD Date: 11/05/2020; Time: 2:24 PM

## 2020-11-06 DIAGNOSIS — Z992 Dependence on renal dialysis: Secondary | ICD-10-CM | POA: Diagnosis not present

## 2020-11-06 DIAGNOSIS — D509 Iron deficiency anemia, unspecified: Secondary | ICD-10-CM | POA: Diagnosis not present

## 2020-11-06 DIAGNOSIS — N2581 Secondary hyperparathyroidism of renal origin: Secondary | ICD-10-CM | POA: Diagnosis not present

## 2020-11-06 DIAGNOSIS — N186 End stage renal disease: Secondary | ICD-10-CM | POA: Diagnosis not present

## 2020-11-08 DIAGNOSIS — N2581 Secondary hyperparathyroidism of renal origin: Secondary | ICD-10-CM | POA: Diagnosis not present

## 2020-11-08 DIAGNOSIS — Z992 Dependence on renal dialysis: Secondary | ICD-10-CM | POA: Diagnosis not present

## 2020-11-08 DIAGNOSIS — D509 Iron deficiency anemia, unspecified: Secondary | ICD-10-CM | POA: Diagnosis not present

## 2020-11-08 DIAGNOSIS — N186 End stage renal disease: Secondary | ICD-10-CM | POA: Diagnosis not present

## 2020-11-11 DIAGNOSIS — N186 End stage renal disease: Secondary | ICD-10-CM | POA: Diagnosis not present

## 2020-11-11 DIAGNOSIS — Z992 Dependence on renal dialysis: Secondary | ICD-10-CM | POA: Diagnosis not present

## 2020-11-11 DIAGNOSIS — D509 Iron deficiency anemia, unspecified: Secondary | ICD-10-CM | POA: Diagnosis not present

## 2020-11-11 DIAGNOSIS — N2581 Secondary hyperparathyroidism of renal origin: Secondary | ICD-10-CM | POA: Diagnosis not present

## 2020-11-13 DIAGNOSIS — N2581 Secondary hyperparathyroidism of renal origin: Secondary | ICD-10-CM | POA: Diagnosis not present

## 2020-11-13 DIAGNOSIS — N186 End stage renal disease: Secondary | ICD-10-CM | POA: Diagnosis not present

## 2020-11-13 DIAGNOSIS — D509 Iron deficiency anemia, unspecified: Secondary | ICD-10-CM | POA: Diagnosis not present

## 2020-11-13 DIAGNOSIS — Z992 Dependence on renal dialysis: Secondary | ICD-10-CM | POA: Diagnosis not present

## 2020-11-15 DIAGNOSIS — D509 Iron deficiency anemia, unspecified: Secondary | ICD-10-CM | POA: Diagnosis not present

## 2020-11-15 DIAGNOSIS — Z992 Dependence on renal dialysis: Secondary | ICD-10-CM | POA: Diagnosis not present

## 2020-11-15 DIAGNOSIS — N186 End stage renal disease: Secondary | ICD-10-CM | POA: Diagnosis not present

## 2020-11-15 DIAGNOSIS — N2581 Secondary hyperparathyroidism of renal origin: Secondary | ICD-10-CM | POA: Diagnosis not present

## 2020-11-17 LAB — DRUG SCREEN 10 W/CONF, SERUM
Amphetamines, IA: NEGATIVE ng/mL
Barbiturates, IA: NEGATIVE ug/mL
Benzodiazepines, IA: NEGATIVE ng/mL
Cocaine & Metabolite, IA: NEGATIVE ng/mL
Methadone, IA: NEGATIVE ng/mL
Opiates, IA: NEGATIVE ng/mL
Oxycodones, IA: NEGATIVE ng/mL
Phencyclidine, IA: NEGATIVE ng/mL
Propoxyphene, IA: NEGATIVE ng/mL
THC(Marijuana) Metabolite, IA: NEGATIVE ng/mL

## 2020-11-17 LAB — OXYCODONES,MS,WB/SP RFX
Oxycocone: NEGATIVE ng/mL
Oxycodones Confirmation: NEGATIVE
Oxymorphone: NEGATIVE ng/mL

## 2020-11-18 ENCOUNTER — Other Ambulatory Visit: Payer: Self-pay

## 2020-11-18 ENCOUNTER — Encounter: Payer: Self-pay | Admitting: Gastroenterology

## 2020-11-18 ENCOUNTER — Ambulatory Visit (INDEPENDENT_AMBULATORY_CARE_PROVIDER_SITE_OTHER): Payer: Medicare Other | Admitting: Gastroenterology

## 2020-11-18 VITALS — BP 189/84 | HR 84 | Ht 67.0 in | Wt 231.6 lb

## 2020-11-18 DIAGNOSIS — N2581 Secondary hyperparathyroidism of renal origin: Secondary | ICD-10-CM | POA: Diagnosis not present

## 2020-11-18 DIAGNOSIS — D509 Iron deficiency anemia, unspecified: Secondary | ICD-10-CM | POA: Diagnosis not present

## 2020-11-18 DIAGNOSIS — K580 Irritable bowel syndrome with diarrhea: Secondary | ICD-10-CM

## 2020-11-18 DIAGNOSIS — N186 End stage renal disease: Secondary | ICD-10-CM | POA: Diagnosis not present

## 2020-11-18 DIAGNOSIS — Z992 Dependence on renal dialysis: Secondary | ICD-10-CM | POA: Diagnosis not present

## 2020-11-18 MED ORDER — DICYCLOMINE HCL 20 MG PO TABS
20.0000 mg | ORAL_TABLET | Freq: Three times a day (TID) | ORAL | 3 refills | Status: DC
Start: 2020-11-18 — End: 2021-02-04

## 2020-11-18 NOTE — Progress Notes (Signed)
Primary Care Physician: Olin Hauser, DO  Primary Gastroenterologist:  Dr. [name]  Chief Complaint  Patient presents with  . Diarrhea  . Rectal Bleeding    HPI: [name] is a 54 y.o. female here with history of multiple colon polyps.  The patient's last colonoscopy was in April where she was found to have Sessile serrated Polyps.  The patient has a history of having a barium swallow that showed possible esophageal spasms and the patient was sent to [facility] for esophageal manometry. The patient was referred back to [place] for abdominal cramps, chronic diarrhea and Hematochezia.  The patient's last colonoscopy showed internal hemorrhoids.The patient had a colonoscopy back in 2018 by Dr. Marius Ditch that showed acute colitis without any sign of chronicity most consistent with NSAIDs versus a reaction to the preparation for the colonoscopy. The patient reports that she started having diarrhea about the same time she started to have dialysis.  The patient has renal failure with a report of gastroparesis and peripheral neuropathy or from diabetes.  The patient also reports that she has not lost any weight but has gained weight.  She states that food runs right through her and she can see the food 30 minutes after she eats it in the stools.  The patient also has intermittent constipation associated with the diarrhea.  She states that she usually has 4 to 5 days of the diarrhea and then she feels like she can go for 2 days.  Past Medical History:  Diagnosis Date  . Allergy   . Anemia   . Anxiety    worse when not at home - bowel incontinence  . Arthritis   . Ataxia   . COPD (chronic obstructive pulmonary disease) ([place])   . Depression   . Diabetes mellitus with complication ([place])   . Dialysis patient ([place])   . ESRD on hemodialysis ([place])    MWF dialysis  . Fistula    left upper arm  . Hepatitis 2003   Hep C  . Hiatal hernia   . Hypercholesterolemia   . Hypertension   . Migraine    . Neuropathy, diabetic (HCC)    lower legs  . Non-alcoholic cirrhosis ([place])   . Peripheral vascular disease ([place])   . Pneumonia 2015  . Psoriasis   . Renal insufficiency   . Tobacco dependence   . Wears dentures    full upper, partial lower    Current Outpatient Medications  Medication Sig Dispense Refill  . Adalimumab (HUMIRA) 40 MG/0.4ML PSKT Inject 40 mg into the skin every 14 (fourteen) days.    Marland Kitchen atropine 1 % ophthalmic solution Place 1 drop into the left eye daily.     . brimonidine (ALPHAGAN) 0.2 % ophthalmic solution Place 1 drop into the left eye in the morning and at bedtime.     Marland Kitchen diltiazem (CARDIZEM) 30 MG tablet TAKE 1 TAB BY MOUTH 3 TIMES A DAY. MAY REDUCE DOSE TO 1/2 TAB AS PREFERRED DAY BEFORE HEMODIALYSIS. SKIP DOSE MORNING OF HD 90 tablet 2  . dorzolamidel-timolol (COSOPT) 22.3-6.8 MG/ML SOLN ophthalmic solution Place 1 drop into the left eye 2 (two) times daily.     . ferric citrate (AURYXIA) 1 GM 210 MG(Fe) tablet Take 420 mg by mouth 3 (three) times daily with meals.    Marland Kitchen glucose blood (TRUE METRIX BLOOD GLUCOSE TEST) test strip Use as instructed 100 each 12  . insulin aspart (NOVOLOG) 100 UNIT/ML injection INJECT 2 UNITS SUBCUTANEOUSLY  3 TIMES A DAY BEFORE MEALS 10 mL 5  . insulin glargine (LANTUS) 100 UNIT/ML injection INJECT 10 UNITS SUBCUTANEOUSLY ONCE A DAY 10 mL 5  . ketoconazole (NIZORAL) 2 % cream APPLY TOPICALLY ONTO THE SKIN (BOTH FEET) TWICE A DAY AS NEEDED 30 g 5  . levocetirizine (XYZAL) 5 MG tablet TAKE ONE TABLET BY MOUTH EVERY EVENING 90 tablet 0  . midodrine (PROAMATINE) 10 MG tablet Take 5-10 mg by mouth daily as needed.    Marland Kitchen omeprazole (PRILOSEC) 40 MG capsule Take 40 mg by mouth daily.    . ondansetron (ZOFRAN) 4 MG tablet Take 4 mg by mouth 2 (two) times daily as needed for vomiting.     Marland Kitchen oxyCODONE (OXY IR/ROXICODONE) 5 MG immediate release tablet Take 1 tablet (5 mg total) by mouth 2 (two) times daily as needed for severe pain. Must last 30  days. 60 tablet 0  . [START ON 12/17/2020] oxyCODONE (OXY IR/ROXICODONE) 5 MG immediate release tablet Take 1 tablet (5 mg total) by mouth 2 (two) times daily as needed for severe pain. Must last 30 days. 60 tablet 0  . [START ON 01/16/2021] oxyCODONE (OXY IR/ROXICODONE) 5 MG immediate release tablet Take 1 tablet (5 mg total) by mouth 2 (two) times daily as needed for severe pain. Must last 30 days. 60 tablet 0  . TRUEPLUS INSULIN SYRINGE 29G X 1/2" 0.5 ML MISC USE 4 TIMES A DAY WITH LANTUS & NOVOLOG 120 each 3  . dicyclomine (BENTYL) 20 MG tablet Take 1 tablet (20 mg total) by mouth 3 (three) times daily before meals. 90 tablet 3  . sevelamer carbonate (RENVELA) 2.4 g PACK Take 2.4 g by mouth 3 (three) times daily.  (Patient not taking: Reported on 11/05/2020)     No current facility-administered medications for this visit.    Allergies as of 11/18/2020 - Review Complete 11/18/2020  Allergen Reaction Noted  . Cinnamon Anaphylaxis 10/11/2015  . Garlic Anaphylaxis and Hives 11/28/2014  . Onion Hives and Swelling 11/28/2014  . Tylenol [acetaminophen] Anaphylaxis 07/25/2015  . Ciprofloxacin Diarrhea 11/05/2016  . Prednisone Other (See Comments) 01/05/2017    ROS:  General: Negative for anorexia, weight loss, fever, chills, fatigue, weakness. ENT: Negative for hoarseness, difficulty swallowing , nasal congestion. CV: Negative for chest pain, angina, palpitations, dyspnea on exertion, peripheral edema.  Respiratory: Negative for dyspnea at rest, dyspnea on exertion, cough, sputum, wheezing.  GI: See history of present illness. GU:  Negative for dysuria, hematuria, urinary incontinence, urinary frequency, nocturnal urination.  Endo: Negative for unusual weight change.    Physical Examination:   BP (!) 189/84   Pulse 84   Ht 5\' 7"  (1.702 m)   Wt 231 lb 9.6 oz (105.1 kg)   BMI 36.27 kg/m   General: Well-nourished, well-developed in no acute distress.  Eyes: No icterus. Conjunctivae  pink. Lungs: Clear to auscultation bilaterally. Non-labored. Heart: Regular rate and rhythm, no murmurs rubs or gallops.  Abdomen: Bowel sounds are normal, nontender, nondistended, no hepatosplenomegaly or masses, no abdominal bruits or hernia , no rebound or guarding.   Extremities: No lower extremity edema. No clubbing or deformities. Neuro: Alert and oriented x 3.  Grossly intact. Skin: Warm and dry, no jaundice.   Psych: Alert and cooperative, normal mood and affect.  Labs:    Imaging Studies: No results found.  Assessment and Plan:   [name] is a 54 y.o. y/o female who comes in with signs and symptoms consistent with irritable bowel  syndrome and possibly diabetic diarrhea.  The patient had her gallbladder out many years ago and did not start having her diarrhea until 2015.  The patient will be put on a fiber supplementation be taken once or twice a day.  She will also be started on dicyclomine for her abdominal cramps.  She will be started on 20 mg 3 times a day before she eats.  She will also get liquid Imodium and titrated so that she is not having as frequent bowel movements.  The patient has been explained the plan and has been told to contact me if her symptoms do not improve.     [name], MD. Marval Regal    Note: This dictation was prepared with Dragon dictation along with smaller phrase technology. Any transcriptional errors that result from this process are unintentional.

## 2020-11-19 DIAGNOSIS — N186 End stage renal disease: Secondary | ICD-10-CM | POA: Diagnosis not present

## 2020-11-19 DIAGNOSIS — Z992 Dependence on renal dialysis: Secondary | ICD-10-CM | POA: Diagnosis not present

## 2020-11-20 DIAGNOSIS — D631 Anemia in chronic kidney disease: Secondary | ICD-10-CM | POA: Diagnosis not present

## 2020-11-20 DIAGNOSIS — R1110 Vomiting, unspecified: Secondary | ICD-10-CM | POA: Diagnosis not present

## 2020-11-20 DIAGNOSIS — Z992 Dependence on renal dialysis: Secondary | ICD-10-CM | POA: Diagnosis not present

## 2020-11-20 DIAGNOSIS — N2581 Secondary hyperparathyroidism of renal origin: Secondary | ICD-10-CM | POA: Diagnosis not present

## 2020-11-20 DIAGNOSIS — R110 Nausea: Secondary | ICD-10-CM | POA: Diagnosis not present

## 2020-11-20 DIAGNOSIS — N186 End stage renal disease: Secondary | ICD-10-CM | POA: Diagnosis not present

## 2020-11-20 DIAGNOSIS — D509 Iron deficiency anemia, unspecified: Secondary | ICD-10-CM | POA: Diagnosis not present

## 2020-11-22 DIAGNOSIS — R1110 Vomiting, unspecified: Secondary | ICD-10-CM | POA: Diagnosis not present

## 2020-11-22 DIAGNOSIS — D509 Iron deficiency anemia, unspecified: Secondary | ICD-10-CM | POA: Diagnosis not present

## 2020-11-22 DIAGNOSIS — Z992 Dependence on renal dialysis: Secondary | ICD-10-CM | POA: Diagnosis not present

## 2020-11-22 DIAGNOSIS — R110 Nausea: Secondary | ICD-10-CM | POA: Diagnosis not present

## 2020-11-22 DIAGNOSIS — N186 End stage renal disease: Secondary | ICD-10-CM | POA: Diagnosis not present

## 2020-11-22 DIAGNOSIS — N2581 Secondary hyperparathyroidism of renal origin: Secondary | ICD-10-CM | POA: Diagnosis not present

## 2020-11-25 DIAGNOSIS — N186 End stage renal disease: Secondary | ICD-10-CM | POA: Diagnosis not present

## 2020-11-25 DIAGNOSIS — R110 Nausea: Secondary | ICD-10-CM | POA: Diagnosis not present

## 2020-11-25 DIAGNOSIS — Z992 Dependence on renal dialysis: Secondary | ICD-10-CM | POA: Diagnosis not present

## 2020-11-25 DIAGNOSIS — R1110 Vomiting, unspecified: Secondary | ICD-10-CM | POA: Diagnosis not present

## 2020-11-25 DIAGNOSIS — D509 Iron deficiency anemia, unspecified: Secondary | ICD-10-CM | POA: Diagnosis not present

## 2020-11-25 DIAGNOSIS — N2581 Secondary hyperparathyroidism of renal origin: Secondary | ICD-10-CM | POA: Diagnosis not present

## 2020-11-27 DIAGNOSIS — N186 End stage renal disease: Secondary | ICD-10-CM | POA: Diagnosis not present

## 2020-11-27 DIAGNOSIS — D509 Iron deficiency anemia, unspecified: Secondary | ICD-10-CM | POA: Diagnosis not present

## 2020-11-27 DIAGNOSIS — R1110 Vomiting, unspecified: Secondary | ICD-10-CM | POA: Diagnosis not present

## 2020-11-27 DIAGNOSIS — Z992 Dependence on renal dialysis: Secondary | ICD-10-CM | POA: Diagnosis not present

## 2020-11-27 DIAGNOSIS — N2581 Secondary hyperparathyroidism of renal origin: Secondary | ICD-10-CM | POA: Diagnosis not present

## 2020-11-27 DIAGNOSIS — R110 Nausea: Secondary | ICD-10-CM | POA: Diagnosis not present

## 2020-11-29 DIAGNOSIS — N186 End stage renal disease: Secondary | ICD-10-CM | POA: Diagnosis not present

## 2020-11-29 DIAGNOSIS — Z992 Dependence on renal dialysis: Secondary | ICD-10-CM | POA: Diagnosis not present

## 2020-11-29 DIAGNOSIS — R1110 Vomiting, unspecified: Secondary | ICD-10-CM | POA: Diagnosis not present

## 2020-11-29 DIAGNOSIS — N2581 Secondary hyperparathyroidism of renal origin: Secondary | ICD-10-CM | POA: Diagnosis not present

## 2020-11-29 DIAGNOSIS — D509 Iron deficiency anemia, unspecified: Secondary | ICD-10-CM | POA: Diagnosis not present

## 2020-11-29 DIAGNOSIS — R110 Nausea: Secondary | ICD-10-CM | POA: Diagnosis not present

## 2020-12-02 DIAGNOSIS — R110 Nausea: Secondary | ICD-10-CM | POA: Diagnosis not present

## 2020-12-02 DIAGNOSIS — R1110 Vomiting, unspecified: Secondary | ICD-10-CM | POA: Diagnosis not present

## 2020-12-02 DIAGNOSIS — Z992 Dependence on renal dialysis: Secondary | ICD-10-CM | POA: Diagnosis not present

## 2020-12-02 DIAGNOSIS — N186 End stage renal disease: Secondary | ICD-10-CM | POA: Diagnosis not present

## 2020-12-02 DIAGNOSIS — D509 Iron deficiency anemia, unspecified: Secondary | ICD-10-CM | POA: Diagnosis not present

## 2020-12-02 DIAGNOSIS — N2581 Secondary hyperparathyroidism of renal origin: Secondary | ICD-10-CM | POA: Diagnosis not present

## 2020-12-03 DIAGNOSIS — L4050 Arthropathic psoriasis, unspecified: Secondary | ICD-10-CM | POA: Diagnosis not present

## 2020-12-03 DIAGNOSIS — M0579 Rheumatoid arthritis with rheumatoid factor of multiple sites without organ or systems involvement: Secondary | ICD-10-CM | POA: Diagnosis not present

## 2020-12-04 DIAGNOSIS — R1110 Vomiting, unspecified: Secondary | ICD-10-CM | POA: Diagnosis not present

## 2020-12-04 DIAGNOSIS — Z992 Dependence on renal dialysis: Secondary | ICD-10-CM | POA: Diagnosis not present

## 2020-12-04 DIAGNOSIS — R110 Nausea: Secondary | ICD-10-CM | POA: Diagnosis not present

## 2020-12-04 DIAGNOSIS — D509 Iron deficiency anemia, unspecified: Secondary | ICD-10-CM | POA: Diagnosis not present

## 2020-12-04 DIAGNOSIS — N2581 Secondary hyperparathyroidism of renal origin: Secondary | ICD-10-CM | POA: Diagnosis not present

## 2020-12-04 DIAGNOSIS — N186 End stage renal disease: Secondary | ICD-10-CM | POA: Diagnosis not present

## 2020-12-05 DIAGNOSIS — H53122 Transient visual loss, left eye: Secondary | ICD-10-CM | POA: Diagnosis not present

## 2020-12-06 DIAGNOSIS — R110 Nausea: Secondary | ICD-10-CM | POA: Diagnosis not present

## 2020-12-06 DIAGNOSIS — R1110 Vomiting, unspecified: Secondary | ICD-10-CM | POA: Diagnosis not present

## 2020-12-06 DIAGNOSIS — N186 End stage renal disease: Secondary | ICD-10-CM | POA: Diagnosis not present

## 2020-12-06 DIAGNOSIS — Z992 Dependence on renal dialysis: Secondary | ICD-10-CM | POA: Diagnosis not present

## 2020-12-06 DIAGNOSIS — D509 Iron deficiency anemia, unspecified: Secondary | ICD-10-CM | POA: Diagnosis not present

## 2020-12-06 DIAGNOSIS — N2581 Secondary hyperparathyroidism of renal origin: Secondary | ICD-10-CM | POA: Diagnosis not present

## 2020-12-09 DIAGNOSIS — D509 Iron deficiency anemia, unspecified: Secondary | ICD-10-CM | POA: Diagnosis not present

## 2020-12-09 DIAGNOSIS — Z992 Dependence on renal dialysis: Secondary | ICD-10-CM | POA: Diagnosis not present

## 2020-12-09 DIAGNOSIS — R110 Nausea: Secondary | ICD-10-CM | POA: Diagnosis not present

## 2020-12-09 DIAGNOSIS — N186 End stage renal disease: Secondary | ICD-10-CM | POA: Diagnosis not present

## 2020-12-09 DIAGNOSIS — R1110 Vomiting, unspecified: Secondary | ICD-10-CM | POA: Diagnosis not present

## 2020-12-09 DIAGNOSIS — N2581 Secondary hyperparathyroidism of renal origin: Secondary | ICD-10-CM | POA: Diagnosis not present

## 2020-12-10 DIAGNOSIS — Q6689 Other  specified congenital deformities of feet: Secondary | ICD-10-CM | POA: Diagnosis not present

## 2020-12-10 DIAGNOSIS — Z794 Long term (current) use of insulin: Secondary | ICD-10-CM | POA: Diagnosis not present

## 2020-12-10 DIAGNOSIS — E1140 Type 2 diabetes mellitus with diabetic neuropathy, unspecified: Secondary | ICD-10-CM | POA: Diagnosis not present

## 2020-12-10 DIAGNOSIS — L97521 Non-pressure chronic ulcer of other part of left foot limited to breakdown of skin: Secondary | ICD-10-CM | POA: Diagnosis not present

## 2020-12-11 DIAGNOSIS — Z992 Dependence on renal dialysis: Secondary | ICD-10-CM | POA: Diagnosis not present

## 2020-12-11 DIAGNOSIS — N2581 Secondary hyperparathyroidism of renal origin: Secondary | ICD-10-CM | POA: Diagnosis not present

## 2020-12-11 DIAGNOSIS — R1110 Vomiting, unspecified: Secondary | ICD-10-CM | POA: Diagnosis not present

## 2020-12-11 DIAGNOSIS — N186 End stage renal disease: Secondary | ICD-10-CM | POA: Diagnosis not present

## 2020-12-11 DIAGNOSIS — D509 Iron deficiency anemia, unspecified: Secondary | ICD-10-CM | POA: Diagnosis not present

## 2020-12-11 DIAGNOSIS — R110 Nausea: Secondary | ICD-10-CM | POA: Diagnosis not present

## 2020-12-13 DIAGNOSIS — D509 Iron deficiency anemia, unspecified: Secondary | ICD-10-CM | POA: Diagnosis not present

## 2020-12-13 DIAGNOSIS — R1110 Vomiting, unspecified: Secondary | ICD-10-CM | POA: Diagnosis not present

## 2020-12-13 DIAGNOSIS — N2581 Secondary hyperparathyroidism of renal origin: Secondary | ICD-10-CM | POA: Diagnosis not present

## 2020-12-13 DIAGNOSIS — R110 Nausea: Secondary | ICD-10-CM | POA: Diagnosis not present

## 2020-12-13 DIAGNOSIS — N186 End stage renal disease: Secondary | ICD-10-CM | POA: Diagnosis not present

## 2020-12-13 DIAGNOSIS — Z992 Dependence on renal dialysis: Secondary | ICD-10-CM | POA: Diagnosis not present

## 2020-12-15 DIAGNOSIS — R110 Nausea: Secondary | ICD-10-CM | POA: Diagnosis not present

## 2020-12-15 DIAGNOSIS — D509 Iron deficiency anemia, unspecified: Secondary | ICD-10-CM | POA: Diagnosis not present

## 2020-12-15 DIAGNOSIS — N186 End stage renal disease: Secondary | ICD-10-CM | POA: Diagnosis not present

## 2020-12-15 DIAGNOSIS — R1110 Vomiting, unspecified: Secondary | ICD-10-CM | POA: Diagnosis not present

## 2020-12-15 DIAGNOSIS — Z992 Dependence on renal dialysis: Secondary | ICD-10-CM | POA: Diagnosis not present

## 2020-12-15 DIAGNOSIS — N2581 Secondary hyperparathyroidism of renal origin: Secondary | ICD-10-CM | POA: Diagnosis not present

## 2020-12-16 DIAGNOSIS — Z992 Dependence on renal dialysis: Secondary | ICD-10-CM | POA: Diagnosis not present

## 2020-12-16 DIAGNOSIS — N186 End stage renal disease: Secondary | ICD-10-CM | POA: Diagnosis not present

## 2020-12-16 DIAGNOSIS — R110 Nausea: Secondary | ICD-10-CM | POA: Diagnosis not present

## 2020-12-16 DIAGNOSIS — N2581 Secondary hyperparathyroidism of renal origin: Secondary | ICD-10-CM | POA: Diagnosis not present

## 2020-12-16 DIAGNOSIS — R1110 Vomiting, unspecified: Secondary | ICD-10-CM | POA: Diagnosis not present

## 2020-12-16 DIAGNOSIS — D509 Iron deficiency anemia, unspecified: Secondary | ICD-10-CM | POA: Diagnosis not present

## 2020-12-17 ENCOUNTER — Other Ambulatory Visit: Payer: Self-pay | Admitting: Family Medicine

## 2020-12-17 DIAGNOSIS — J00 Acute nasopharyngitis [common cold]: Secondary | ICD-10-CM

## 2020-12-17 DIAGNOSIS — L4050 Arthropathic psoriasis, unspecified: Secondary | ICD-10-CM | POA: Diagnosis not present

## 2020-12-17 DIAGNOSIS — M0579 Rheumatoid arthritis with rheumatoid factor of multiple sites without organ or systems involvement: Secondary | ICD-10-CM | POA: Diagnosis not present

## 2020-12-17 DIAGNOSIS — E1122 Type 2 diabetes mellitus with diabetic chronic kidney disease: Secondary | ICD-10-CM

## 2020-12-17 NOTE — Telephone Encounter (Signed)
Requested Prescriptions  Pending Prescriptions Disp Refills   levocetirizine (XYZAL) 5 MG tablet [Pharmacy Med Name: levocetirizine 5 mg tablet] 30 tablet 0    Sig: TAKE ONE TABLET BY MOUTH EVERY EVENING     Ear, Nose, and Throat:  Antihistamines Passed - 12/17/2020  9:56 AM      Passed - Valid encounter within last 12 months    Recent Outpatient Visits          4 months ago Type 2 diabetes mellitus with ESRD (end-stage renal disease) ([place])   [place], DO   10 months ago Type 2 diabetes mellitus with ESRD (end-stage renal disease) ([place])   [facility] Olin Hauser, DO   1 year ago Type 2 diabetes mellitus with ESRD (end-stage renal disease) ([place])   [facility] Olin Hauser, DO   1 year ago General medical exam   [facility] Olin Hauser, DO   1 year ago Tinea of groin   [facility] Merrilyn Puma, Jerrel Ivory, NP              TRUEPLUS INSULIN SYRINGE 29G X 1/2" 0.5 ML MISC [Pharmacy Med Name: TRUEplus Insulin 0.5 mL 29 gauge x 1/2" syringe] 100 each 3    Sig: USE 4 TIMES A DAY WITH LANTUS & NOVOLOG     There is no refill protocol information for this order

## 2020-12-18 DIAGNOSIS — D509 Iron deficiency anemia, unspecified: Secondary | ICD-10-CM | POA: Diagnosis not present

## 2020-12-18 DIAGNOSIS — N186 End stage renal disease: Secondary | ICD-10-CM | POA: Diagnosis not present

## 2020-12-18 DIAGNOSIS — Z992 Dependence on renal dialysis: Secondary | ICD-10-CM | POA: Diagnosis not present

## 2020-12-18 DIAGNOSIS — R110 Nausea: Secondary | ICD-10-CM | POA: Diagnosis not present

## 2020-12-18 DIAGNOSIS — N2581 Secondary hyperparathyroidism of renal origin: Secondary | ICD-10-CM | POA: Diagnosis not present

## 2020-12-18 DIAGNOSIS — R1110 Vomiting, unspecified: Secondary | ICD-10-CM | POA: Diagnosis not present

## 2020-12-20 DIAGNOSIS — N186 End stage renal disease: Secondary | ICD-10-CM | POA: Diagnosis not present

## 2020-12-20 DIAGNOSIS — R110 Nausea: Secondary | ICD-10-CM | POA: Diagnosis not present

## 2020-12-20 DIAGNOSIS — N2581 Secondary hyperparathyroidism of renal origin: Secondary | ICD-10-CM | POA: Diagnosis not present

## 2020-12-20 DIAGNOSIS — R1110 Vomiting, unspecified: Secondary | ICD-10-CM | POA: Diagnosis not present

## 2020-12-20 DIAGNOSIS — Z992 Dependence on renal dialysis: Secondary | ICD-10-CM | POA: Diagnosis not present

## 2020-12-20 DIAGNOSIS — D509 Iron deficiency anemia, unspecified: Secondary | ICD-10-CM | POA: Diagnosis not present

## 2020-12-21 DIAGNOSIS — Z992 Dependence on renal dialysis: Secondary | ICD-10-CM | POA: Diagnosis not present

## 2020-12-21 DIAGNOSIS — N2581 Secondary hyperparathyroidism of renal origin: Secondary | ICD-10-CM | POA: Diagnosis not present

## 2020-12-21 DIAGNOSIS — D631 Anemia in chronic kidney disease: Secondary | ICD-10-CM | POA: Diagnosis not present

## 2020-12-21 DIAGNOSIS — D509 Iron deficiency anemia, unspecified: Secondary | ICD-10-CM | POA: Diagnosis not present

## 2020-12-21 DIAGNOSIS — N186 End stage renal disease: Secondary | ICD-10-CM | POA: Diagnosis not present

## 2020-12-23 DIAGNOSIS — N186 End stage renal disease: Secondary | ICD-10-CM | POA: Diagnosis not present

## 2020-12-23 DIAGNOSIS — Z992 Dependence on renal dialysis: Secondary | ICD-10-CM | POA: Diagnosis not present

## 2020-12-23 DIAGNOSIS — D509 Iron deficiency anemia, unspecified: Secondary | ICD-10-CM | POA: Diagnosis not present

## 2020-12-23 DIAGNOSIS — N2581 Secondary hyperparathyroidism of renal origin: Secondary | ICD-10-CM | POA: Diagnosis not present

## 2020-12-23 DIAGNOSIS — D631 Anemia in chronic kidney disease: Secondary | ICD-10-CM | POA: Diagnosis not present

## 2020-12-25 DIAGNOSIS — D509 Iron deficiency anemia, unspecified: Secondary | ICD-10-CM | POA: Diagnosis not present

## 2020-12-25 DIAGNOSIS — N2581 Secondary hyperparathyroidism of renal origin: Secondary | ICD-10-CM | POA: Diagnosis not present

## 2020-12-25 DIAGNOSIS — D631 Anemia in chronic kidney disease: Secondary | ICD-10-CM | POA: Diagnosis not present

## 2020-12-25 DIAGNOSIS — N186 End stage renal disease: Secondary | ICD-10-CM | POA: Diagnosis not present

## 2020-12-25 DIAGNOSIS — Z992 Dependence on renal dialysis: Secondary | ICD-10-CM | POA: Diagnosis not present

## 2020-12-27 DIAGNOSIS — N2581 Secondary hyperparathyroidism of renal origin: Secondary | ICD-10-CM | POA: Diagnosis not present

## 2020-12-27 DIAGNOSIS — Z992 Dependence on renal dialysis: Secondary | ICD-10-CM | POA: Diagnosis not present

## 2020-12-27 DIAGNOSIS — D509 Iron deficiency anemia, unspecified: Secondary | ICD-10-CM | POA: Diagnosis not present

## 2020-12-27 DIAGNOSIS — D631 Anemia in chronic kidney disease: Secondary | ICD-10-CM | POA: Diagnosis not present

## 2020-12-27 DIAGNOSIS — N186 End stage renal disease: Secondary | ICD-10-CM | POA: Diagnosis not present

## 2020-12-30 DIAGNOSIS — Z992 Dependence on renal dialysis: Secondary | ICD-10-CM | POA: Diagnosis not present

## 2020-12-30 DIAGNOSIS — N186 End stage renal disease: Secondary | ICD-10-CM | POA: Diagnosis not present

## 2020-12-30 DIAGNOSIS — N2581 Secondary hyperparathyroidism of renal origin: Secondary | ICD-10-CM | POA: Diagnosis not present

## 2020-12-30 DIAGNOSIS — D631 Anemia in chronic kidney disease: Secondary | ICD-10-CM | POA: Diagnosis not present

## 2020-12-30 DIAGNOSIS — D509 Iron deficiency anemia, unspecified: Secondary | ICD-10-CM | POA: Diagnosis not present

## 2020-12-31 DIAGNOSIS — M0579 Rheumatoid arthritis with rheumatoid factor of multiple sites without organ or systems involvement: Secondary | ICD-10-CM | POA: Diagnosis not present

## 2020-12-31 DIAGNOSIS — L4050 Arthropathic psoriasis, unspecified: Secondary | ICD-10-CM | POA: Diagnosis not present

## 2021-01-01 DIAGNOSIS — Z992 Dependence on renal dialysis: Secondary | ICD-10-CM | POA: Diagnosis not present

## 2021-01-01 DIAGNOSIS — D631 Anemia in chronic kidney disease: Secondary | ICD-10-CM | POA: Diagnosis not present

## 2021-01-01 DIAGNOSIS — D509 Iron deficiency anemia, unspecified: Secondary | ICD-10-CM | POA: Diagnosis not present

## 2021-01-01 DIAGNOSIS — N2581 Secondary hyperparathyroidism of renal origin: Secondary | ICD-10-CM | POA: Diagnosis not present

## 2021-01-01 DIAGNOSIS — N186 End stage renal disease: Secondary | ICD-10-CM | POA: Diagnosis not present

## 2021-01-03 DIAGNOSIS — D631 Anemia in chronic kidney disease: Secondary | ICD-10-CM | POA: Diagnosis not present

## 2021-01-03 DIAGNOSIS — N186 End stage renal disease: Secondary | ICD-10-CM | POA: Diagnosis not present

## 2021-01-03 DIAGNOSIS — D509 Iron deficiency anemia, unspecified: Secondary | ICD-10-CM | POA: Diagnosis not present

## 2021-01-03 DIAGNOSIS — Z992 Dependence on renal dialysis: Secondary | ICD-10-CM | POA: Diagnosis not present

## 2021-01-03 DIAGNOSIS — N2581 Secondary hyperparathyroidism of renal origin: Secondary | ICD-10-CM | POA: Diagnosis not present

## 2021-01-08 DIAGNOSIS — N2581 Secondary hyperparathyroidism of renal origin: Secondary | ICD-10-CM | POA: Diagnosis not present

## 2021-01-08 DIAGNOSIS — D509 Iron deficiency anemia, unspecified: Secondary | ICD-10-CM | POA: Diagnosis not present

## 2021-01-08 DIAGNOSIS — Z992 Dependence on renal dialysis: Secondary | ICD-10-CM | POA: Diagnosis not present

## 2021-01-08 DIAGNOSIS — N186 End stage renal disease: Secondary | ICD-10-CM | POA: Diagnosis not present

## 2021-01-08 DIAGNOSIS — D631 Anemia in chronic kidney disease: Secondary | ICD-10-CM | POA: Diagnosis not present

## 2021-01-09 DIAGNOSIS — N2581 Secondary hyperparathyroidism of renal origin: Secondary | ICD-10-CM | POA: Diagnosis not present

## 2021-01-09 DIAGNOSIS — D631 Anemia in chronic kidney disease: Secondary | ICD-10-CM | POA: Diagnosis not present

## 2021-01-09 DIAGNOSIS — D509 Iron deficiency anemia, unspecified: Secondary | ICD-10-CM | POA: Diagnosis not present

## 2021-01-09 DIAGNOSIS — N186 End stage renal disease: Secondary | ICD-10-CM | POA: Diagnosis not present

## 2021-01-09 DIAGNOSIS — Z992 Dependence on renal dialysis: Secondary | ICD-10-CM | POA: Diagnosis not present

## 2021-01-10 DIAGNOSIS — D631 Anemia in chronic kidney disease: Secondary | ICD-10-CM | POA: Diagnosis not present

## 2021-01-10 DIAGNOSIS — N2581 Secondary hyperparathyroidism of renal origin: Secondary | ICD-10-CM | POA: Diagnosis not present

## 2021-01-10 DIAGNOSIS — D509 Iron deficiency anemia, unspecified: Secondary | ICD-10-CM | POA: Diagnosis not present

## 2021-01-10 DIAGNOSIS — Z992 Dependence on renal dialysis: Secondary | ICD-10-CM | POA: Diagnosis not present

## 2021-01-10 DIAGNOSIS — N186 End stage renal disease: Secondary | ICD-10-CM | POA: Diagnosis not present

## 2021-01-13 DIAGNOSIS — N2581 Secondary hyperparathyroidism of renal origin: Secondary | ICD-10-CM | POA: Diagnosis not present

## 2021-01-13 DIAGNOSIS — D631 Anemia in chronic kidney disease: Secondary | ICD-10-CM | POA: Diagnosis not present

## 2021-01-13 DIAGNOSIS — N186 End stage renal disease: Secondary | ICD-10-CM | POA: Diagnosis not present

## 2021-01-13 DIAGNOSIS — E119 Type 2 diabetes mellitus without complications: Secondary | ICD-10-CM | POA: Diagnosis not present

## 2021-01-13 DIAGNOSIS — D509 Iron deficiency anemia, unspecified: Secondary | ICD-10-CM | POA: Diagnosis not present

## 2021-01-13 DIAGNOSIS — Z794 Long term (current) use of insulin: Secondary | ICD-10-CM | POA: Diagnosis not present

## 2021-01-13 DIAGNOSIS — Z992 Dependence on renal dialysis: Secondary | ICD-10-CM | POA: Diagnosis not present

## 2021-01-13 LAB — HEMOGLOBIN A1C: Hemoglobin A1C: 7.7

## 2021-01-14 ENCOUNTER — Other Ambulatory Visit: Payer: Self-pay | Admitting: Family Medicine

## 2021-01-14 DIAGNOSIS — I10 Essential (primary) hypertension: Secondary | ICD-10-CM

## 2021-01-14 DIAGNOSIS — E1122 Type 2 diabetes mellitus with diabetic chronic kidney disease: Secondary | ICD-10-CM

## 2021-01-15 DIAGNOSIS — N186 End stage renal disease: Secondary | ICD-10-CM | POA: Diagnosis not present

## 2021-01-15 DIAGNOSIS — D631 Anemia in chronic kidney disease: Secondary | ICD-10-CM | POA: Diagnosis not present

## 2021-01-15 DIAGNOSIS — D509 Iron deficiency anemia, unspecified: Secondary | ICD-10-CM | POA: Diagnosis not present

## 2021-01-15 DIAGNOSIS — Z992 Dependence on renal dialysis: Secondary | ICD-10-CM | POA: Diagnosis not present

## 2021-01-15 DIAGNOSIS — N2581 Secondary hyperparathyroidism of renal origin: Secondary | ICD-10-CM | POA: Diagnosis not present

## 2021-01-16 DIAGNOSIS — L4050 Arthropathic psoriasis, unspecified: Secondary | ICD-10-CM | POA: Diagnosis not present

## 2021-01-16 DIAGNOSIS — L409 Psoriasis, unspecified: Secondary | ICD-10-CM | POA: Diagnosis not present

## 2021-01-16 DIAGNOSIS — Z992 Dependence on renal dialysis: Secondary | ICD-10-CM | POA: Diagnosis not present

## 2021-01-16 DIAGNOSIS — Z794 Long term (current) use of insulin: Secondary | ICD-10-CM | POA: Diagnosis not present

## 2021-01-16 DIAGNOSIS — N186 End stage renal disease: Secondary | ICD-10-CM | POA: Diagnosis not present

## 2021-01-16 DIAGNOSIS — E1169 Type 2 diabetes mellitus with other specified complication: Secondary | ICD-10-CM | POA: Diagnosis not present

## 2021-01-16 DIAGNOSIS — M0579 Rheumatoid arthritis with rheumatoid factor of multiple sites without organ or systems involvement: Secondary | ICD-10-CM | POA: Diagnosis not present

## 2021-01-17 DIAGNOSIS — D509 Iron deficiency anemia, unspecified: Secondary | ICD-10-CM | POA: Diagnosis not present

## 2021-01-17 DIAGNOSIS — N186 End stage renal disease: Secondary | ICD-10-CM | POA: Diagnosis not present

## 2021-01-17 DIAGNOSIS — N2581 Secondary hyperparathyroidism of renal origin: Secondary | ICD-10-CM | POA: Diagnosis not present

## 2021-01-17 DIAGNOSIS — D631 Anemia in chronic kidney disease: Secondary | ICD-10-CM | POA: Diagnosis not present

## 2021-01-17 DIAGNOSIS — Z992 Dependence on renal dialysis: Secondary | ICD-10-CM | POA: Diagnosis not present

## 2021-01-20 DIAGNOSIS — D509 Iron deficiency anemia, unspecified: Secondary | ICD-10-CM | POA: Diagnosis not present

## 2021-01-20 DIAGNOSIS — N186 End stage renal disease: Secondary | ICD-10-CM | POA: Diagnosis not present

## 2021-01-20 DIAGNOSIS — D631 Anemia in chronic kidney disease: Secondary | ICD-10-CM | POA: Diagnosis not present

## 2021-01-20 DIAGNOSIS — Z992 Dependence on renal dialysis: Secondary | ICD-10-CM | POA: Diagnosis not present

## 2021-01-20 DIAGNOSIS — N2581 Secondary hyperparathyroidism of renal origin: Secondary | ICD-10-CM | POA: Diagnosis not present

## 2021-01-21 DIAGNOSIS — Z992 Dependence on renal dialysis: Secondary | ICD-10-CM | POA: Diagnosis not present

## 2021-01-21 DIAGNOSIS — N2581 Secondary hyperparathyroidism of renal origin: Secondary | ICD-10-CM | POA: Diagnosis not present

## 2021-01-21 DIAGNOSIS — D509 Iron deficiency anemia, unspecified: Secondary | ICD-10-CM | POA: Diagnosis not present

## 2021-01-21 DIAGNOSIS — N186 End stage renal disease: Secondary | ICD-10-CM | POA: Diagnosis not present

## 2021-01-21 DIAGNOSIS — D631 Anemia in chronic kidney disease: Secondary | ICD-10-CM | POA: Diagnosis not present

## 2021-01-22 DIAGNOSIS — Z992 Dependence on renal dialysis: Secondary | ICD-10-CM | POA: Diagnosis not present

## 2021-01-22 DIAGNOSIS — D509 Iron deficiency anemia, unspecified: Secondary | ICD-10-CM | POA: Diagnosis not present

## 2021-01-22 DIAGNOSIS — D631 Anemia in chronic kidney disease: Secondary | ICD-10-CM | POA: Diagnosis not present

## 2021-01-22 DIAGNOSIS — N186 End stage renal disease: Secondary | ICD-10-CM | POA: Diagnosis not present

## 2021-01-22 DIAGNOSIS — N2581 Secondary hyperparathyroidism of renal origin: Secondary | ICD-10-CM | POA: Diagnosis not present

## 2021-01-24 DIAGNOSIS — D631 Anemia in chronic kidney disease: Secondary | ICD-10-CM | POA: Diagnosis not present

## 2021-01-24 DIAGNOSIS — Z992 Dependence on renal dialysis: Secondary | ICD-10-CM | POA: Diagnosis not present

## 2021-01-24 DIAGNOSIS — N2581 Secondary hyperparathyroidism of renal origin: Secondary | ICD-10-CM | POA: Diagnosis not present

## 2021-01-24 DIAGNOSIS — N186 End stage renal disease: Secondary | ICD-10-CM | POA: Diagnosis not present

## 2021-01-24 DIAGNOSIS — D509 Iron deficiency anemia, unspecified: Secondary | ICD-10-CM | POA: Diagnosis not present

## 2021-01-27 DIAGNOSIS — D631 Anemia in chronic kidney disease: Secondary | ICD-10-CM | POA: Diagnosis not present

## 2021-01-27 DIAGNOSIS — D509 Iron deficiency anemia, unspecified: Secondary | ICD-10-CM | POA: Diagnosis not present

## 2021-01-27 DIAGNOSIS — N2581 Secondary hyperparathyroidism of renal origin: Secondary | ICD-10-CM | POA: Diagnosis not present

## 2021-01-27 DIAGNOSIS — Z992 Dependence on renal dialysis: Secondary | ICD-10-CM | POA: Diagnosis not present

## 2021-01-27 DIAGNOSIS — N186 End stage renal disease: Secondary | ICD-10-CM | POA: Diagnosis not present

## 2021-01-28 ENCOUNTER — Ambulatory Visit: Payer: Medicare Other

## 2021-01-28 DIAGNOSIS — L4050 Arthropathic psoriasis, unspecified: Secondary | ICD-10-CM | POA: Diagnosis not present

## 2021-01-28 DIAGNOSIS — M0579 Rheumatoid arthritis with rheumatoid factor of multiple sites without organ or systems involvement: Secondary | ICD-10-CM | POA: Diagnosis not present

## 2021-01-29 DIAGNOSIS — D631 Anemia in chronic kidney disease: Secondary | ICD-10-CM | POA: Diagnosis not present

## 2021-01-29 DIAGNOSIS — D509 Iron deficiency anemia, unspecified: Secondary | ICD-10-CM | POA: Diagnosis not present

## 2021-01-29 DIAGNOSIS — N186 End stage renal disease: Secondary | ICD-10-CM | POA: Diagnosis not present

## 2021-01-29 DIAGNOSIS — Z992 Dependence on renal dialysis: Secondary | ICD-10-CM | POA: Diagnosis not present

## 2021-01-29 DIAGNOSIS — N2581 Secondary hyperparathyroidism of renal origin: Secondary | ICD-10-CM | POA: Diagnosis not present

## 2021-01-31 DIAGNOSIS — N186 End stage renal disease: Secondary | ICD-10-CM | POA: Diagnosis not present

## 2021-01-31 DIAGNOSIS — D631 Anemia in chronic kidney disease: Secondary | ICD-10-CM | POA: Diagnosis not present

## 2021-01-31 DIAGNOSIS — N2581 Secondary hyperparathyroidism of renal origin: Secondary | ICD-10-CM | POA: Diagnosis not present

## 2021-01-31 DIAGNOSIS — Z992 Dependence on renal dialysis: Secondary | ICD-10-CM | POA: Diagnosis not present

## 2021-01-31 DIAGNOSIS — D509 Iron deficiency anemia, unspecified: Secondary | ICD-10-CM | POA: Diagnosis not present

## 2021-02-03 DIAGNOSIS — N186 End stage renal disease: Secondary | ICD-10-CM | POA: Diagnosis not present

## 2021-02-03 DIAGNOSIS — N2581 Secondary hyperparathyroidism of renal origin: Secondary | ICD-10-CM | POA: Diagnosis not present

## 2021-02-03 DIAGNOSIS — D631 Anemia in chronic kidney disease: Secondary | ICD-10-CM | POA: Diagnosis not present

## 2021-02-03 DIAGNOSIS — Z992 Dependence on renal dialysis: Secondary | ICD-10-CM | POA: Diagnosis not present

## 2021-02-03 DIAGNOSIS — D509 Iron deficiency anemia, unspecified: Secondary | ICD-10-CM | POA: Diagnosis not present

## 2021-02-04 ENCOUNTER — Ambulatory Visit (HOSPITAL_BASED_OUTPATIENT_CLINIC_OR_DEPARTMENT_OTHER): Payer: Medicare Other | Admitting: Student in an Organized Health Care Education/Training Program

## 2021-02-04 ENCOUNTER — Ambulatory Visit
Admission: RE | Admit: 2021-02-04 | Discharge: 2021-02-04 | Disposition: A | Discharge status: Home / Self Care | Payer: Medicare Other | Source: Ambulatory Visit | Attending: Student in an Organized Health Care Education/Training Program | Admitting: Student in an Organized Health Care Education/Training Program

## 2021-02-04 ENCOUNTER — Ambulatory Visit
Admission: RE | Admit: 2021-02-04 | Discharge: 2021-02-04 | Disposition: A | Discharge status: Home / Self Care | Payer: Medicare Other | Attending: Student in an Organized Health Care Education/Training Program | Admitting: Student in an Organized Health Care Education/Training Program

## 2021-02-04 ENCOUNTER — Other Ambulatory Visit: Payer: Self-pay

## 2021-02-04 ENCOUNTER — Encounter: Payer: Self-pay | Admitting: Student in an Organized Health Care Education/Training Program

## 2021-02-04 VITALS — BP 172/62 | HR 70 | Temp 97.0°F | Ht 67.0 in | Wt 230.0 lb

## 2021-02-04 DIAGNOSIS — M5412 Radiculopathy, cervical region: Secondary | ICD-10-CM

## 2021-02-04 DIAGNOSIS — G894 Chronic pain syndrome: Secondary | ICD-10-CM | POA: Insufficient documentation

## 2021-02-04 DIAGNOSIS — M5416 Radiculopathy, lumbar region: Secondary | ICD-10-CM | POA: Insufficient documentation

## 2021-02-04 DIAGNOSIS — M47816 Spondylosis without myelopathy or radiculopathy, lumbar region: Secondary | ICD-10-CM | POA: Insufficient documentation

## 2021-02-04 DIAGNOSIS — M5136 Other intervertebral disc degeneration, lumbar region: Secondary | ICD-10-CM | POA: Diagnosis not present

## 2021-02-04 DIAGNOSIS — G8929 Other chronic pain: Secondary | ICD-10-CM | POA: Diagnosis not present

## 2021-02-04 DIAGNOSIS — E1142 Type 2 diabetes mellitus with diabetic polyneuropathy: Secondary | ICD-10-CM

## 2021-02-04 MED ORDER — OXYCODONE HCL 5 MG PO TABS
5.0000 mg | ORAL_TABLET | Freq: Two times a day (BID) | ORAL | 0 refills | Status: DC | PRN
Start: 2021-03-17 — End: 2021-02-10

## 2021-02-04 MED ORDER — OXYCODONE HCL 5 MG PO TABS: 5.0000 mg | ORAL_TABLET | Freq: Two times a day (BID) | ORAL | 0 refills | Status: DC | PRN

## 2021-02-04 NOTE — Progress Notes (Signed)
PROVIDER NOTE: Information contained herein reflects review and annotations entered in association with encounter. Interpretation of such information and data should be left to medically-trained personnel. Information provided to patient can be located elsewhere in the medical record under "Patient Instructions". Document created using STT-dictation technology, any transcriptional errors that may result from process are unintentional.    Patient: [name]  Service Category: E/M  Provider: Gillis Santa, MD  DOB: 1966/05/18  DOS: 02/04/2021  Specialty: Interventional Pain Management  MRN: [id number]  Setting: Ambulatory outpatient  PCP: Olin Hauser, DO  Type: Established Patient    Referring Provider: Nobie Putnam *  Location: Office  Delivery: Face-to-face     HPI  [name], a 55 y.o. year old female, is here today because of her Lumbar spondylosis [M47.816]. Ms. Curling primary complain today is Back Pain Last encounter: My last encounter with her was on 11/05/2020. Pertinent problems: Ms. Poinsett has ESRD (end stage renal disease) on dialysis [facility]); Type 2 diabetes mellitus with ESRD (end-stage renal disease) ([place]); Chronic bilateral low back pain; DDD (degenerative disc disease), cervical; Spondylosis of cervical region without myelopathy or radiculopathy; PAD (peripheral artery disease) ([place]); Primary osteoarthritis of right knee; Chronic pain syndrome; Diabetic polyneuropathy associated with type 2 diabetes mellitus ([place]); Cervical radiculopathy; Lumbar radiculopathy; GAD (generalized anxiety disorder); and Major depressive disorder, recurrent, moderate (HCC) on their pertinent problem list. Pain Assessment: Severity of Chronic pain is reported as a 8 /10. Location: Back Lower/Denies. Onset: More than a month ago. Quality: Aching,Throbbing,Stabbing,Burning,Pressure. Timing: Constant. Modifying factor(s): meds. Vitals:  height is $RemoveB'5\' 7"'fxvqCKle$  (1.702 m) and weight is 230 lb  (104.3 kg). Her temperature is 97 F (36.1 C) (abnormal). Her blood pressure is 172/62 (abnormal) and her pulse is 70. Her oxygen saturation is 100%.   Reason for encounter: medication management.    Continues to endorse persistent low back pain. It is worse with lumbar extension. She utilizes oxycodone twice a day but states that she would like another tablet as she is utilizing 1 to 2 tablets at night when she has increased pain. She would like to avoid any steroid-based therapies to prevent a spike in her blood sugars. She states that she has been utilizing ibuprofen which I counseled her on given her end-stage renal disease although she is already on dialysis. She has been seeing Dr. Posey Pronto with rheumatology and has been receiving Orencia infusions. She has not noticed a difference in her pain related to RA since her infusions.  Today we discussed a diagnostic lumbar medial branch nerve block with local anesthetic only. If she obtains greater than 50% pain relief for 12 to 24 hours then could consider  peripheral nerve stimulation of medial branch nerves. I described this procedure in detail to the patient as well as potential benefits and and associated risk. This is a nonsteroid-based procedure. Patient would like to trial the diagnostic lumbar facet medial branch nerve block with local anesthetic only.   Pharmacotherapy Assessment   Analgesic: Oxycodone 5 mg twice daily as needed, quantity 60/month; MME equals 15    Monitoring: [place] PMP: PDMP reviewed during this encounter.       Pharmacotherapy: No side-effects or adverse reactions reported. Compliance: No problems identified. Effectiveness: Clinically acceptable.  Chauncey Fischer, RN  02/04/2021 10:57 AM  Sign when Signing Visit Nursing Pain Medication Assessment:  Safety precautions to be maintained throughout the outpatient stay will include: orient to surroundings, keep bed in low position, maintain call bell within  reach at all times,  provide assistance with transfer out of bed and ambulation.  Medication Inspection Compliance: Pill count conducted under aseptic conditions, in front of the patient. Neither the pills nor the bottle was removed from the patient's sight at any time. Once count was completed pills were immediately returned to the patient in their original bottle.  Medication: Oxycodone IR Pill/Patch Count: 28 of 60 pills remain Pill/Patch Appearance: Markings consistent with prescribed medication Bottle Appearance: Standard pharmacy container. Clearly labeled. Filled Date: 1 / 68 / 22 Last Medication intake:  YesterdaySafety precautions to be maintained throughout the outpatient stay will include: orient to surroundings, keep bed in low position, maintain call bell within reach at all times, provide assistance with transfer out of bed and ambulation.     UDS: No results found for: SUMMARY   ROS  Constitutional: Denies any fever or chills Gastrointestinal: No reported hemesis, hematochezia, vomiting, or acute GI distress Musculoskeletal: Low back pain Neurological: No reported episodes of acute onset apraxia, aphasia, dysarthria, agnosia, amnesia, paralysis, loss of coordination, or loss of consciousness  Medication Review  Abatacept, INSULIN SYRINGE .5CC/29G, atropine, brimonidine, diltiazem, dorzolamidel-timolol, glucose blood, insulin aspart, insulin glargine, ketoconazole, levocetirizine, midodrine, omeprazole, ondansetron, oxyCODONE, and sevelamer carbonate  History Review  Allergy: Ms. Meharg is allergic to cinnamon, garlic, onion, tylenol [acetaminophen], ciprofloxacin, and prednisone. Drug: Ms. Warzecha  reports no history of drug use. Alcohol:  reports no history of alcohol use. Tobacco:  reports that she has been smoking cigarettes. She has a 5.00 pack-year smoking history. She has never used smokeless tobacco. Social: Ms. Bacorn  reports that she has been smoking cigarettes. She has a 5.00 pack-year  smoking history. She has never used smokeless tobacco. She reports that she does not drink alcohol and does not use drugs. Medical:  has a past medical history of Allergy, Anemia, Anxiety, Arthritis, Ataxia, COPD (chronic obstructive pulmonary disease) (HCC), Depression, Diabetes mellitus with complication (HCC), Dialysis patient (HCC), ESRD on hemodialysis (HCC), Fistula, Hepatitis (2003), Hiatal hernia, Hypercholesterolemia, Hypertension, Migraine, Neuropathy, diabetic (HCC), Non-alcoholic cirrhosis (HCC), Peripheral vascular disease (HCC), Pneumonia (2015), Psoriasis, Renal insufficiency, Tobacco dependence, and Wears dentures. Surgical: Ms. Reimann  has a past surgical history that includes Tonsillectomy; rt. tubal and ovary removed; Cholecystectomy; Cardiac catheterization (N/A, 07/25/2015); Cardiac catheterization (Left, 07/25/2015); Cardiac catheterization (Left, 10/07/2015); Cardiac catheterization (N/A, 10/07/2015); Cardiac catheterization (10/07/2015); Cardiac catheterization (N/A, 12/17/2015); Incision and drainage abscess (N/A, 07/16/2016); cyst removed  from left hand (Left, 1989); AV fistula placement (Left, 11/2014); Cardiac catheterization (N/A, 01/11/2017); Cardiac catheterization (N/A, 01/11/2017); DIALYSIS/PERMA CATHETER INSERTION (N/A, 05/20/2017); Cesarean section; Tubal ligation; Colonoscopy (N/A, 09/22/2017); Esophagogastroduodenoscopy (N/A, 09/22/2017); polypectomy (09/22/2017); A/V SHUNT INTERVENTION (N/A, 12/16/2017); STENT PLACEMENT VASCULAR ([facility]) (Left, 03/2018); Upper Extremity Angiography (Left, 09/29/2019); Aqueous shunt (Left, 12/20/2019); Colonoscopy with propofol (N/A, 04/16/2020); and A/V Fistulagram (Left, 04/23/2020). Family: family history includes Heart disease in her mother; Hypertension in her father and mother; Skin cancer in her father.  Laboratory Chemistry Profile   Renal Lab Results  Component Value Date   BUN 52 (H) 05/01/2019   CREATININE 8.2 (A) 07/17/2020   BCR 10  12/27/2017   GFRAA 6 (L) 05/01/2019   GFRNONAA 6 (L) 05/01/2019     Hepatic Lab Results  Component Value Date   AST 15 07/17/2020   ALT 12 03/26/2020   ALBUMIN 4.0 07/17/2020   ALKPHOS 74 07/17/2020   HCVAB >11.0 (H) 12/16/2016   LIPASE 21 04/30/2019     Electrolytes Lab Results  Component Value Date  NA 137 07/17/2020   K 5.5 (A) 07/17/2020   CL 101 07/17/2020   CALCIUM 7.6 (A) 07/17/2020   MG 2.4 12/14/2017   PHOS 4.2 05/01/2019     Bone No results found for: VD25OH, VD125OH2TOT, MQ2863OT7, RN1657XU3, 25OHVITD1, 25OHVITD2, 25OHVITD3, TESTOFREE, TESTOSTERONE   Inflammation (CRP: Acute Phase) (ESR: Chronic Phase) Lab Results  Component Value Date   CRP <0.8 12/16/2016   ESRSEDRATE 117 (H) 10/12/2017   LATICACIDVEN 1.6 04/30/2019       Note: Above Lab results reviewed.  Physical Exam  General appearance: Well nourished, well developed, and well hydrated. In no apparent acute distress Mental status: Alert, oriented x 3 (person, place, & time)       Respiratory: No evidence of acute respiratory distress Eyes: PERLA Vitals: BP (!) 172/62   Pulse 70   Temp (!) 97 F (36.1 C)   Ht $R'5\' 7"'eD$  (1.702 m)   Wt 230 lb (104.3 kg)   SpO2 100%   BMI 36.02 kg/m  BMI: Estimated body mass index is 36.02 kg/m as calculated from the following:   Height as of this encounter: $RemoveBeforeD'5\' 7"'HgifyBNZyulIvs$  (1.702 m).   Weight as of this encounter: 230 lb (104.3 kg). Ideal: Ideal body weight: 61.6 kg (135 lb 12.9 oz) Adjusted ideal body weight: 78.7 kg (173 lb 7.7 oz)  Lumbar Spine Area Exam  Skin & Axial Inspection: No masses, redness, or swelling Alignment: Symmetrical Functional ROM: Pain restricted ROM affecting both sides Stability: No instability detected Muscle Tone/Strength: Functionally intact. No obvious neuro-muscular anomalies detected. Sensory (Neurological): Musculoskeletal pain pattern  Provocative Tests: Hyperextension/rotation test: (+) bilaterally for facet joint pain. Lumbar  quadrant test (Kemp's test): (+) bilaterally for facet joint pain.  Gait & Posture Assessment  Ambulation: Unassisted Gait: Antalgic gait (limping) Posture: Difficulty standing up straight, due to pain    Assessment   Status Diagnosis  Having a Flare-up Persistent Persistent 1. Lumbar spondylosis   2. Lumbar radiculopathy   3. Diabetic polyneuropathy associated with type 2 diabetes mellitus (HCC)   4. Cervical radiculopathy   5. Chronic pain syndrome   6. Lumbar facet arthropathy      Updated Problems: Problem  Lumbar Facet Arthropathy    Plan of Care   [name] has a current medication list which includes the following long-term medication(s): orencia, diltiazem, insulin aspart, insulin glargine, levocetirizine, [START ON 02/15/2021] oxycodone, and [START ON 03/17/2021] oxycodone.  1. Temporary increase in oxycodone from quantity 60 to quantity 75 given increased low back pain. Hopefully this will allow the patient to utilize less NSAIDs. 2. Schedule for diagnostic bilateral L3-L4 lumbar facet milligrams nerve block with local anesthetic only. If effective, can consider lumbar RFA versus Sprint peripheral nerve stimulation.  Pharmacotherapy (Medications Ordered): Meds ordered this encounter  Medications  . oxyCODONE (OXY IR/ROXICODONE) 5 MG immediate release tablet    Sig: Take 1 tablet (5 mg total) by mouth 2 (two) times daily as needed for severe pain. Must last 30 days.    Dispense:  75 tablet    Refill:  0    Chronic Pain. (STOP Act - Not applicable). Fill one day early if closed on scheduled refill date.  Marland Kitchen oxyCODONE (OXY IR/ROXICODONE) 5 MG immediate release tablet    Sig: Take 1 tablet (5 mg total) by mouth 2 (two) times daily as needed for severe pain. Must last 30 days.    Dispense:  75 tablet    Refill:  0    Chronic Pain. (  STOP Act - Not applicable). Fill one day early if closed on scheduled refill date.   Orders:  Orders Placed This Encounter   Procedures  . LUMBAR FACET(MEDIAL BRANCH NERVE BLOCK) MBNB    Standing Status:   Future    Standing Expiration Date:   03/04/2021    Scheduling Instructions:     Procedure: Lumbar facet block (AKA.: Lumbosacral medial branch nerve block)     Side: Bilateral     Level: L3-4, Facets (L3, L4,  Medial Branch Nerves)     Timeframe: ASAA    Order Specific Question:   Where will this procedure be performed?    Answer:   [facility]  . DG Lumbar Spine Complete W/Bend    Patient presents with axial pain with possible radicular component.  In addition to any acute findings, please report on:  1. Facet (Zygapophyseal) joint DJD (Hypertrophy, space narrowing, subchondral sclerosis, and/or osteophyte formation) 2. DDD and/or IVDD (Loss of disc height, desiccation or "Black disc disease") 3. Pars defects 4. Spondylolisthesis, spondylosis, and/or spondyloarthropathies (include Degree/Grade of displacement in mm) 5. Vertebral body Fractures, including age (old, new/acute) 35. Modic Type Changes 7. Demineralization 8. Bone pathology 9. Central, Lateral Recess, and/or Foraminal Stenosis (include AP diameter of stenosis in mm) 10. Surgical changes (hardware type, status, and presence of fibrosis)  NOTE: Please specify level(s) and laterality. If applicable: Please indicate ROM and/or evidence of instability (>71mm displacement between flexion and extension views)    Standing Status:   Future    Number of Occurrences:   1    Standing Expiration Date:   05/04/2021    Order Specific Question:   Reason for Exam (SYMPTOM  OR DIAGNOSIS REQUIRED)    Answer:   Low back pain    Order Specific Question:   Is patient pregnant?    Answer:   No    Order Specific Question:   Preferred imaging location?    Answer:    [facility]    Order Specific Question:   Call Results- Best Contact Number?    Answer:   [phone] ([facility])    Order Specific Question:   Radiology Contrast Protocol - do  NOT remove file path    Answer:   [email or web address]   Follow-up plan:   Return in about 2 weeks (around 02/18/2021) for L4 MBB B/L w/o steroid.   Recent Visits No visits were found meeting these conditions. Showing recent visits within past 90 days and meeting all other requirements Today's Visits Date Type Provider Dept  02/04/21 Office Visit Gillis Santa, MD [facility]  Showing today's visits and meeting all other requirements Future Appointments Date Type Provider Dept  02/17/21 Appointment Gillis Santa, MD [facility]  04/01/21 Appointment Gillis Santa, MD [facility]  Showing future appointments within next 90 days and meeting all other requirements  I discussed the assessment and treatment plan with the patient. The patient was provided an opportunity to ask questions and all were answered. The patient agreed with the plan and demonstrated an understanding of the instructions.  Patient advised to call back or seek an in-person evaluation if the symptoms or condition worsens.  Duration of encounter: 4minutes.  Note by: Gillis Santa, MD Date: 02/04/2021; Time: 1:30 PM

## 2021-02-04 NOTE — Progress Notes (Signed)
Nursing Pain Medication Assessment:  Safety precautions to be maintained throughout the outpatient stay will include: orient to surroundings, keep bed in low position, maintain call bell within reach at all times, provide assistance with transfer out of bed and ambulation.  Medication Inspection Compliance: Pill count conducted under aseptic conditions, in front of the patient. Neither the pills nor the bottle was removed from the patient's sight at any time. Once count was completed pills were immediately returned to the patient in their original bottle.  Medication: Oxycodone IR Pill/Patch Count: 28 of 60 pills remain Pill/Patch Appearance: Markings consistent with prescribed medication Bottle Appearance: Standard pharmacy container. Clearly labeled. Filled Date: 1 / 54 / 22 Last Medication intake:  YesterdaySafety precautions to be maintained throughout the outpatient stay will include: orient to surroundings, keep bed in low position, maintain call bell within reach at all times, provide assistance with transfer out of bed and ambulation.

## 2021-02-04 NOTE — Patient Instructions (Addendum)

## 2021-02-05 DIAGNOSIS — D509 Iron deficiency anemia, unspecified: Secondary | ICD-10-CM | POA: Diagnosis not present

## 2021-02-05 DIAGNOSIS — N186 End stage renal disease: Secondary | ICD-10-CM | POA: Diagnosis not present

## 2021-02-05 DIAGNOSIS — D631 Anemia in chronic kidney disease: Secondary | ICD-10-CM | POA: Diagnosis not present

## 2021-02-05 DIAGNOSIS — Z992 Dependence on renal dialysis: Secondary | ICD-10-CM | POA: Diagnosis not present

## 2021-02-05 DIAGNOSIS — N2581 Secondary hyperparathyroidism of renal origin: Secondary | ICD-10-CM | POA: Diagnosis not present

## 2021-02-06 ENCOUNTER — Telehealth: Payer: Self-pay

## 2021-02-06 NOTE — Telephone Encounter (Signed)
Pt was seen on 02/04/21 and dr.lateef changed her medicine for 1 during the day and 1 at night to 1 pill during the day and 2 at night. However the Rx at the pharm is not showing the updated rx. And she does not have enough  To get through the remainder of the month  Rx: Oxycodene

## 2021-02-06 NOTE — Telephone Encounter (Signed)
Returned patient phone call, voicemail left with instructions from last visit re; increase in dosing or qty increase.  Instructions left that is not a permanent every day increase just as needed which is why he gave her 15 extra tablets per month.  Asked to call back if there are additional questions.

## 2021-02-07 ENCOUNTER — Telehealth: Payer: Self-pay | Admitting: Student in an Organized Health Care Education/Training Program

## 2021-02-07 DIAGNOSIS — N2581 Secondary hyperparathyroidism of renal origin: Secondary | ICD-10-CM | POA: Diagnosis not present

## 2021-02-07 DIAGNOSIS — E1142 Type 2 diabetes mellitus with diabetic polyneuropathy: Secondary | ICD-10-CM

## 2021-02-07 DIAGNOSIS — N186 End stage renal disease: Secondary | ICD-10-CM | POA: Diagnosis not present

## 2021-02-07 DIAGNOSIS — D631 Anemia in chronic kidney disease: Secondary | ICD-10-CM | POA: Diagnosis not present

## 2021-02-07 DIAGNOSIS — G894 Chronic pain syndrome: Secondary | ICD-10-CM

## 2021-02-07 DIAGNOSIS — M5412 Radiculopathy, cervical region: Secondary | ICD-10-CM

## 2021-02-07 DIAGNOSIS — D509 Iron deficiency anemia, unspecified: Secondary | ICD-10-CM | POA: Diagnosis not present

## 2021-02-07 DIAGNOSIS — Z992 Dependence on renal dialysis: Secondary | ICD-10-CM | POA: Diagnosis not present

## 2021-02-07 NOTE — Telephone Encounter (Signed)
[name] from [facility] lvmail stating the patient says her script for oxycodone is wrong. There should be an increase on there with new instructions. Please verify and call pharmacy

## 2021-02-07 NOTE — Telephone Encounter (Signed)
Dr Holley Raring, I spoke with the pharmacist ans she states since you increased the quantity to 75, you must change the instructions to match.  It should say 1-3 tablets prn so the instructions justify the quantity.  You need to resend both prescriptions please.  Thank you

## 2021-02-10 ENCOUNTER — Telehealth: Payer: Self-pay | Admitting: Cardiovascular Disease

## 2021-02-10 DIAGNOSIS — N186 End stage renal disease: Secondary | ICD-10-CM | POA: Diagnosis not present

## 2021-02-10 DIAGNOSIS — D509 Iron deficiency anemia, unspecified: Secondary | ICD-10-CM | POA: Diagnosis not present

## 2021-02-10 DIAGNOSIS — Z992 Dependence on renal dialysis: Secondary | ICD-10-CM | POA: Diagnosis not present

## 2021-02-10 DIAGNOSIS — N2581 Secondary hyperparathyroidism of renal origin: Secondary | ICD-10-CM | POA: Diagnosis not present

## 2021-02-10 DIAGNOSIS — D631 Anemia in chronic kidney disease: Secondary | ICD-10-CM | POA: Diagnosis not present

## 2021-02-10 MED ORDER — OXYCODONE HCL 5 MG PO TABS: 5.0000 mg | ORAL_TABLET | Freq: Three times a day (TID) | ORAL | 0 refills | Status: DC | PRN

## 2021-02-10 NOTE — Telephone Encounter (Signed)
Updated prescriptions with correct signature to reflect quantity sent to pharmacy.  Please call pharmacy to cancel incorrect prescriptions.  Requested Prescriptions   Signed Prescriptions Disp Refills  . oxyCODONE (OXY IR/ROXICODONE) 5 MG immediate release tablet 75 tablet 0    Sig: Take 1 tablet (5 mg total) by mouth every 8 (eight) hours as needed for severe pain. Must last 30 days.    Authorizing Provider: Gillis Santa  . oxyCODONE (OXY IR/ROXICODONE) 5 MG immediate release tablet 75 tablet 0    Sig: Take 1 tablet (5 mg total) by mouth every 8 (eight) hours as needed for severe pain. Must last 30 days.    Authorizing Provider: Gillis Santa

## 2021-02-10 NOTE — Telephone Encounter (Signed)
Patient has been contacted at least 3 times for a recall, recall has been deleted

## 2021-02-10 NOTE — Addendum Note (Signed)
Addended by: Gillis Santa on: 02/10/2021 10:13 AM   Modules accepted: Orders

## 2021-02-11 DIAGNOSIS — E113511 Type 2 diabetes mellitus with proliferative diabetic retinopathy with macular edema, right eye: Secondary | ICD-10-CM | POA: Diagnosis not present

## 2021-02-11 DIAGNOSIS — E113512 Type 2 diabetes mellitus with proliferative diabetic retinopathy with macular edema, left eye: Secondary | ICD-10-CM | POA: Diagnosis not present

## 2021-02-11 LAB — HM DIABETES EYE EXAM

## 2021-02-12 DIAGNOSIS — N2581 Secondary hyperparathyroidism of renal origin: Secondary | ICD-10-CM | POA: Diagnosis not present

## 2021-02-12 DIAGNOSIS — Z992 Dependence on renal dialysis: Secondary | ICD-10-CM | POA: Diagnosis not present

## 2021-02-12 DIAGNOSIS — D509 Iron deficiency anemia, unspecified: Secondary | ICD-10-CM | POA: Diagnosis not present

## 2021-02-12 DIAGNOSIS — N186 End stage renal disease: Secondary | ICD-10-CM | POA: Diagnosis not present

## 2021-02-12 DIAGNOSIS — D631 Anemia in chronic kidney disease: Secondary | ICD-10-CM | POA: Diagnosis not present

## 2021-02-14 DIAGNOSIS — Z992 Dependence on renal dialysis: Secondary | ICD-10-CM | POA: Diagnosis not present

## 2021-02-14 DIAGNOSIS — D509 Iron deficiency anemia, unspecified: Secondary | ICD-10-CM | POA: Diagnosis not present

## 2021-02-14 DIAGNOSIS — N186 End stage renal disease: Secondary | ICD-10-CM | POA: Diagnosis not present

## 2021-02-14 DIAGNOSIS — D631 Anemia in chronic kidney disease: Secondary | ICD-10-CM | POA: Diagnosis not present

## 2021-02-14 DIAGNOSIS — N2581 Secondary hyperparathyroidism of renal origin: Secondary | ICD-10-CM | POA: Diagnosis not present

## 2021-02-15 ENCOUNTER — Other Ambulatory Visit: Payer: Self-pay | Admitting: Gastroenterology

## 2021-02-15 ENCOUNTER — Other Ambulatory Visit: Payer: Self-pay | Admitting: Family Medicine

## 2021-02-15 DIAGNOSIS — I10 Essential (primary) hypertension: Secondary | ICD-10-CM

## 2021-02-15 DIAGNOSIS — E1122 Type 2 diabetes mellitus with diabetic chronic kidney disease: Secondary | ICD-10-CM

## 2021-02-15 NOTE — Telephone Encounter (Signed)
Requested Prescriptions  Pending Prescriptions Disp Refills  . insulin glargine (LANTUS) 100 UNIT/ML injection [Pharmacy Med Name: Lantus U-100 Insulin 100 unit/mL subcutaneous solution] 10 mL 2    Sig: INJECT [place] A DAY     Endocrinology:  Diabetes - Insulins Failed - 02/15/2021 10:36 AM      Failed - HBA1C is between 0 and 7.9 and within 180 days    Hemoglobin A1C  Date Value Ref Range Status  07/17/2020 7.7  Final    Comment:    From Nephrology Dr [name]         Failed - Valid encounter within last 6 months    Recent Outpatient Visits          6 months ago Type 2 diabetes mellitus with ESRD (end-stage renal disease) ([place])   [facility] Olin Hauser, DO   1 year ago Type 2 diabetes mellitus with ESRD (end-stage renal disease) ([place])   [facility] Olin Hauser, DO   1 year ago Type 2 diabetes mellitus with ESRD (end-stage renal disease) ([place])   [facility] Olin Hauser, DO   1 year ago General medical exam   [facility] Olin Hauser, DO   1 year ago Tinea of groin   [facility] Merrilyn Puma, Jerrel Ivory, NP      Future Appointments            In 3 days [place], [name], [facility], [place]           . insulin aspart (NOVOLOG) 100 UNIT/ML injection [Pharmacy Med Name: Novolog U-100 Insulin aspart 100 unit/mL subcutaneous solution] 10 mL 2    Sig: INJECT 2 UNITS SUBCUTANEOUSLY 3 TIMES A DAY BEFORE MEALS     Endocrinology:  Diabetes - Insulins Failed - 02/15/2021 10:36 AM      Failed - HBA1C is between 0 and 7.9 and within 180 days    Hemoglobin A1C  Date Value Ref Range Status  07/17/2020 7.7  Final    Comment:    From Nephrology Dr [name]         Failed - Valid encounter within last 6 months    Recent Outpatient Visits          6 months ago Type 2 diabetes mellitus with ESRD (end-stage renal  disease) ([place])   [facility] Olin Hauser, DO   1 year ago Type 2 diabetes mellitus with ESRD (end-stage renal disease) ([place])   [facility] Olin Hauser, DO   1 year ago Type 2 diabetes mellitus with ESRD (end-stage renal disease) ([place])   [facility] Olin Hauser, DO   1 year ago General medical exam   [place], DO   1 year ago Tinea of groin   [place], Jerrel Ivory, NP      Future Appointments            In 3 days [place], DO [facility]           . diltiazem (CARDIZEM) 30 MG tablet [Pharmacy Med Name: diltiazem 30 mg tablet] 90 tablet 2    Sig: TAKE 1 TAB BY MOUTH 3 TIMES A DAY. MAY REDUCE DOSE TO 1/2 TAB AS PREFERRED DAY BEFORE HEMODIALYSIS. SKIP DOSE MORNING OF HD  Cardiovascular:  Calcium Channel Blockers Failed - 02/15/2021 10:36 AM      Failed - Last BP in normal range    BP Readings from Last 1 Encounters:  02/04/21 (!) 172/62         Failed - Valid encounter within last 6 months    Recent Outpatient Visits          6 months ago Type 2 diabetes mellitus with ESRD (end-stage renal disease) ([place])   [facility], [name], DO   1 year ago Type 2 diabetes mellitus with ESRD (end-stage renal disease) ([place])   [facility] Olin Hauser, DO   1 year ago Type 2 diabetes mellitus with ESRD (end-stage renal disease) ([place])   [facility] Olin Hauser, DO   1 year ago General medical exam   [facility] Olin Hauser, DO   1 year ago Tinea of groin   [facility] Merrilyn Puma, Jerrel Ivory, NP      Future Appointments            In 3 days [place], [name], [facility], [facility]

## 2021-02-17 ENCOUNTER — Ambulatory Visit: Payer: Medicare Other | Admitting: Student in an Organized Health Care Education/Training Program

## 2021-02-17 DIAGNOSIS — D631 Anemia in chronic kidney disease: Secondary | ICD-10-CM | POA: Diagnosis not present

## 2021-02-17 DIAGNOSIS — N186 End stage renal disease: Secondary | ICD-10-CM | POA: Diagnosis not present

## 2021-02-17 DIAGNOSIS — N2581 Secondary hyperparathyroidism of renal origin: Secondary | ICD-10-CM | POA: Diagnosis not present

## 2021-02-17 DIAGNOSIS — Z992 Dependence on renal dialysis: Secondary | ICD-10-CM | POA: Diagnosis not present

## 2021-02-17 DIAGNOSIS — D509 Iron deficiency anemia, unspecified: Secondary | ICD-10-CM | POA: Diagnosis not present

## 2021-02-18 ENCOUNTER — Ambulatory Visit (INDEPENDENT_AMBULATORY_CARE_PROVIDER_SITE_OTHER): Payer: Medicare Other | Admitting: Family Medicine

## 2021-02-18 ENCOUNTER — Other Ambulatory Visit: Payer: Self-pay

## 2021-02-18 ENCOUNTER — Encounter: Payer: Self-pay | Admitting: Family Medicine

## 2021-02-18 VITALS — BP 152/78 | HR 71 | Ht 67.0 in | Wt 236.0 lb

## 2021-02-18 DIAGNOSIS — L602 Onychogryphosis: Secondary | ICD-10-CM | POA: Diagnosis not present

## 2021-02-18 DIAGNOSIS — L97509 Non-pressure chronic ulcer of other part of unspecified foot with unspecified severity: Secondary | ICD-10-CM | POA: Insufficient documentation

## 2021-02-18 DIAGNOSIS — E1142 Type 2 diabetes mellitus with diabetic polyneuropathy: Secondary | ICD-10-CM | POA: Diagnosis not present

## 2021-02-18 DIAGNOSIS — D509 Iron deficiency anemia, unspecified: Secondary | ICD-10-CM | POA: Diagnosis not present

## 2021-02-18 DIAGNOSIS — N186 End stage renal disease: Secondary | ICD-10-CM | POA: Diagnosis not present

## 2021-02-18 DIAGNOSIS — L84 Corns and callosities: Secondary | ICD-10-CM | POA: Diagnosis not present

## 2021-02-18 DIAGNOSIS — L97521 Non-pressure chronic ulcer of other part of left foot limited to breakdown of skin: Secondary | ICD-10-CM | POA: Diagnosis not present

## 2021-02-18 DIAGNOSIS — E11621 Type 2 diabetes mellitus with foot ulcer: Secondary | ICD-10-CM | POA: Insufficient documentation

## 2021-02-18 DIAGNOSIS — I10 Essential (primary) hypertension: Secondary | ICD-10-CM

## 2021-02-18 DIAGNOSIS — E1122 Type 2 diabetes mellitus with diabetic chronic kidney disease: Secondary | ICD-10-CM

## 2021-02-18 DIAGNOSIS — J3089 Other allergic rhinitis: Secondary | ICD-10-CM

## 2021-02-18 DIAGNOSIS — F3341 Major depressive disorder, recurrent, in partial remission: Secondary | ICD-10-CM | POA: Diagnosis not present

## 2021-02-18 DIAGNOSIS — N2581 Secondary hyperparathyroidism of renal origin: Secondary | ICD-10-CM | POA: Diagnosis not present

## 2021-02-18 DIAGNOSIS — D631 Anemia in chronic kidney disease: Secondary | ICD-10-CM | POA: Diagnosis not present

## 2021-02-18 DIAGNOSIS — Z992 Dependence on renal dialysis: Secondary | ICD-10-CM | POA: Diagnosis not present

## 2021-02-18 MED ORDER — IPRATROPIUM BROMIDE 0.06 % NA SOLN: 2.0000 | Freq: Four times a day (QID) | NASAL | 5 refills | Status: DC

## 2021-02-18 NOTE — Progress Notes (Signed)
Subjective:    Patient ID: [name], female    DOB: 1966-09-06, 55 y.o.   MRN: [id number]  [name] is a 55 y.o. female presenting on 02/18/2021 for Diabetes   HPI   Followed by [facility] Rheumatology Dr Posey Pronto Chronic Pain Management - Dr Holley Raring  CHRONIC DM, Type 2/ ESRD on HD Diabetic Neuropathy, bilateral feet Toenails Overgrown Left Foot Diabetic Ulcer Previously followed by [facility] - request new referral to other Podiatry now Followed by Vascular specialist  Today reports. No major fluctuations in CBGs. Doing well. A1c has maintained at 7.7 She has diabetic shoes that we have ordered earlier this year. She is on chronic insulin therapy has ESRD on HD M-W-F Meds:Lantus 10 units daily, Novolog mealtime 2-3 units x 3 daily with meals (not take if missed meal) Reports good compliance. Tolerating well w/o side-effectsdoes admit to some wt gain on insulin Lifestyle: - Diet (improving diabetic diet) She has upcoming apt with [place] within 1-2 months, retinopathy, cataracts, L eye blindhistory of glaucoma as well. Admits some nausea with spasm in espohagus / gastroparesis - says if takes insulin and phosphate binders may not eat much, so she eats meal first. Then takes med Denies hypoglycemia, polyuria, visual changes, numbness or tingling.  CHRONIC HTN: Elevated BP today prior to HD. Reports fluctuating BP, often if go to dialysis will have BP 140/70, then if on HD she will have lower BP down to 70/40, she will take Midodrine to help raise her BP. Then will have symptoms of headache and hypertension. Current Meds - Diltiazem 30mg  - (ordered 3 times day) usually taking 1-2 times a day. Often taking  Reports good compliance, took meds today. Tolerating well, w/o complaints. Denies CP, dyspnea, HA, edema, dizziness / lightheadedness   Depression screen [facility] 2/9 02/18/2021 11/05/2020 07/30/2020  Decreased Interest 1 0 1  Down, Depressed, Hopeless 0 1 3   PHQ - 2 Score 1 1 4   Altered sleeping 2 - 3  Tired, decreased energy 3 - 3  Change in appetite 0 - 3  Feeling bad or failure about yourself  0 - 3  Trouble concentrating 0 - 0  Moving slowly or fidgety/restless 0 - 1  Suicidal thoughts 0 - 0  PHQ-9 Score 6 - 17  Difficult doing work/chores Somewhat difficult - Extremely dIfficult  Some recent data might be hidden    Social History   Tobacco Use  . Smoking status: Light Tobacco Smoker    Packs/day: 0.25    Years: 20.00    Pack years: 5.00    Types: Cigarettes    Last attempt to quit: 05/21/2017    Years since quitting: 3.7  . Smokeless tobacco: Never Used  Vaping Use  . Vaping Use: Never used  Substance Use Topics  . Alcohol use: No  . Drug use: No    Review of Systems Per HPI unless specifically indicated above     Objective:    BP (!) 152/78 (BP Location: Left Arm, Cuff Size: Normal)   Pulse 71   Ht 5\' 7"  (1.702 m)   Wt 236 lb (107 kg)   SpO2 100%   BMI 36.96 kg/m   Wt Readings from Last 3 Encounters:  02/18/21 236 lb (107 kg)  02/04/21 230 lb (104.3 kg)  11/18/20 231 lb 9.6 oz (105.1 kg)    Physical Exam Vitals and nursing note reviewed.  Constitutional:      General: She is not in acute  distress.    Appearance: She is well-developed and well-nourished. She is obese. She is not diaphoretic.     Comments: Well-appearing, comfortable, cooperative  HENT:     Head: Normocephalic and atraumatic.     Mouth/Throat:     Mouth: Oropharynx is clear and moist.  Eyes:     General:        Right eye: No discharge.        Left eye: No discharge.     Conjunctiva/sclera: Conjunctivae normal.  Neck:     Thyroid: No thyromegaly.  Cardiovascular:     Rate and Rhythm: Normal rate and regular rhythm.     Pulses: Intact distal pulses.     Heart sounds: Normal heart sounds. No murmur heard.   Pulmonary:     Effort: Pulmonary effort is normal. No respiratory distress.     Breath sounds: Normal breath sounds. No  wheezing or rales.  Musculoskeletal:        General: No edema. Normal range of motion.     Cervical back: Normal range of motion and neck supple.  Lymphadenopathy:     Cervical: No cervical adenopathy.  Skin:    General: Skin is warm and dry.     Findings: No erythema or rash.  Neurological:     Mental Status: She is alert and oriented to person, place, and time.  Psychiatric:        Mood and Affect: Mood and affect normal.        Behavior: Behavior normal.     Comments: Well groomed, good eye contact, normal speech and thoughts      Diabetic Foot Exam - Simple   Simple Foot Form Diabetic Foot exam was performed with the following findings: Yes 02/18/2021  1:37 PM  Visual Inspection See comments: Yes Sensation Testing See comments: Yes Pulse Check Posterior Tibialis and Dorsalis pulse intact bilaterally: Yes Comments Bilateral feet overgrown toenails, callus formation, especially with chronic Left fore foot lateral 3.5 x 2.5 cm callus with some previous ulceration no active ulcer now. Has reduced monofilament bilateral plantar, intact dorsal.     Results for orders placed or performed in visit on 02/18/21  Hemoglobin A1c  Result Value Ref Range   Hemoglobin A1C 7.7       Assessment & Plan:   Problem List Items Addressed This Visit    Type 2 diabetes mellitus with ESRD (end-stage renal disease) ([place]) - Primary    Stable, Improving control again 7.7 Complications - ESRD on HD, peripheral neuropathy, DM retinopathy, hyperlipidemia, GERD, depression, obesity, increases risk of future cardiovascular complications   Plan:  1. Continue current therapy - insulin, Lantus 10u daily, Novolog mea time wc 2u TID adjust accordingly 2. Encourage improved lifestyle - low carb, low sugar diet, reduce portion size, continue improving regular exercise 3. Check CBG, bring log to next visit for review 4. OFF ARB 5. UTD DM foot, has DM Shoes - see info below      Recurrent major  depression in partial remission ([place])   Overgrown toenails   Relevant Orders   Ambulatory referral to Podiatry   Essential hypertension    Managed by Nephrology [facility], on Hemodialysis On Diltiazem regimen History of variable BP, low following HD, Has midodrine PRN Elevated BP prior to HD      Diabetic polyneuropathy associated with type 2 diabetes mellitus (H[facility])    Chronic bilateral diabetic neuropathy in both feet, as complication of diabetes Evidence of callus formation both feet, heels  and mid foot  Significant callus with history of ulceration  especially with chronic Left fore foot lateral 3.5 x 2.5 cm callus with some previous ulceration Overgrown toenails.  Completed DM Foot Exam today in office, on 02/18/21. See exam note.  Plan She will need future updated order for Diabetic Shoes When ready she can submit order form to [place] within appropriate timeline from this visit. If need to repeat exam we can if too long has passed.  Patient would benefit from Diabetic Shoes due to neuropathy with callus formation and great toe deformity causing pain, diabetes control is improving on current regimen, and I am continuing to monitor and manage diabetes.      Relevant Orders   Ambulatory referral to Podiatry   Diabetic foot ulcer ([place])   Relevant Orders   Ambulatory referral to Podiatry    Other Visit Diagnoses    Seasonal allergic rhinitis due to other allergic trigger       Relevant Medications   ipratropium (ATROVENT) 0.06 % nasal spray   Callus of foot       Relevant Orders   Ambulatory referral to Podiatry     She is requesting to switch Podiatry provider, needs new referral now. Switch from [facility] to Transfer care to [place] This Encounter  Procedures  . Hemoglobin A1c    This external order was created through the Results Console.  . Ambulatory referral to Podiatry    Referral Priority:   Routine    Referral Type:   Consultation     Referral Reason:   Specialty Services Required    Requested Specialty:   Podiatry    Number of Visits Requested:   1     Complete FL2  Fax to [facility] - [phone]  Meds ordered this encounter  Medications  . ipratropium (ATROVENT) 0.06 % nasal spray    Sig: Place 2 sprays into both nostrils 4 (four) times daily.    Dispense:  15 mL    Refill:  5     Orders Placed This Encounter  Procedures  . Hemoglobin A1c    This external order was created through the Results Console.  . Ambulatory referral to Podiatry    Referral Priority:   Routine    Referral Type:   Consultation    Referral Reason:   Specialty Services Required    Requested Specialty:   Podiatry    Number of Visits Requested:   1     Follow up plan: Return in about 6 months (around 08/21/2021) for 6 month follow-up DM, ESRD, Neuropathy, HTN.   Nobie Putnam, [facility] 02/18/2021, 9:53 AM

## 2021-02-18 NOTE — Patient Instructions (Addendum)
Thank you for coming to the office today.  Refilled Atrovent nasal  Reviewed labs. Will enter on chart.  Will completed FL2 and fax to [facility] by end of week.  Recent Labs    07/17/20 0000 01/13/21 0000  HGBA1C 7.7 7.7    [facility] Address: [address] [place], [place] [address] Hours: Open 8AM-5PM Phone: [phone]  Referral to Podiatry stay tuned for apt  If need new DM shoes, work on locating company and send [place] order form and we may need to see you again, let me know.   Please schedule a Follow-up Appointment to: Return in about 6 months (around 08/21/2021) for 6 month follow-up DM, ESRD, Neuropathy, HTN.  If you have any other questions or concerns, please feel free to call the office or send a message through [place]. You may also schedule an earlier appointment if necessary.  Additionally, you may be receiving a survey about your experience at our office within a few days to 1 week by e-mail or mail. We value your feedback.  Nobie Putnam, DO [place]

## 2021-02-19 DIAGNOSIS — Z992 Dependence on renal dialysis: Secondary | ICD-10-CM | POA: Diagnosis not present

## 2021-02-19 DIAGNOSIS — N2581 Secondary hyperparathyroidism of renal origin: Secondary | ICD-10-CM | POA: Diagnosis not present

## 2021-02-19 DIAGNOSIS — D509 Iron deficiency anemia, unspecified: Secondary | ICD-10-CM | POA: Diagnosis not present

## 2021-02-19 DIAGNOSIS — N186 End stage renal disease: Secondary | ICD-10-CM | POA: Diagnosis not present

## 2021-02-19 DIAGNOSIS — D631 Anemia in chronic kidney disease: Secondary | ICD-10-CM | POA: Diagnosis not present

## 2021-02-19 NOTE — Assessment & Plan Note (Signed)
Stable, Improving control again 7.7 Complications - ESRD on HD, peripheral neuropathy, DM retinopathy, hyperlipidemia, GERD, depression, obesity, increases risk of future cardiovascular complications   Plan:  1. Continue current therapy - insulin, Lantus 10u daily, Novolog mea time wc 2u TID adjust accordingly 2. Encourage improved lifestyle - low carb, low sugar diet, reduce portion size, continue improving regular exercise 3. Check CBG, bring log to next visit for review 4. OFF ARB 5. UTD DM foot, has DM Shoes - see info below

## 2021-02-19 NOTE — Assessment & Plan Note (Addendum)
Managed by Nephrology [facility], on Hemodialysis On Diltiazem regimen History of variable BP, low following HD, Has midodrine PRN Elevated BP prior to HD

## 2021-02-19 NOTE — Assessment & Plan Note (Addendum)
Chronic bilateral diabetic neuropathy in both feet, as complication of diabetes Evidence of callus formation both feet, heels and mid foot  Significant callus with history of ulceration  especially with chronic Left fore foot lateral 3.5 x 2.5 cm callus with some previous ulceration Overgrown toenails.  Completed DM Foot Exam today in office, on 02/18/21. See exam note.  Plan She will need future updated order for Diabetic Shoes When ready she can submit order form to [place] within appropriate timeline from this visit. If need to repeat exam we can if too long has passed.  Patient would benefit from Diabetic Shoes due to neuropathy with callus formation and great toe deformity causing pain, diabetes control is improving on current regimen, and I am continuing to monitor and manage diabetes.

## 2021-02-21 DIAGNOSIS — N2581 Secondary hyperparathyroidism of renal origin: Secondary | ICD-10-CM | POA: Diagnosis not present

## 2021-02-21 DIAGNOSIS — D631 Anemia in chronic kidney disease: Secondary | ICD-10-CM | POA: Diagnosis not present

## 2021-02-21 DIAGNOSIS — R0602 Shortness of breath: Secondary | ICD-10-CM | POA: Diagnosis not present

## 2021-02-21 DIAGNOSIS — R509 Fever, unspecified: Secondary | ICD-10-CM | POA: Diagnosis not present

## 2021-02-21 DIAGNOSIS — R6883 Chills (without fever): Secondary | ICD-10-CM | POA: Diagnosis not present

## 2021-02-21 DIAGNOSIS — R519 Headache, unspecified: Secondary | ICD-10-CM | POA: Diagnosis not present

## 2021-02-21 DIAGNOSIS — D509 Iron deficiency anemia, unspecified: Secondary | ICD-10-CM | POA: Diagnosis not present

## 2021-02-21 DIAGNOSIS — Z992 Dependence on renal dialysis: Secondary | ICD-10-CM | POA: Diagnosis not present

## 2021-02-21 DIAGNOSIS — N186 End stage renal disease: Secondary | ICD-10-CM | POA: Diagnosis not present

## 2021-02-24 ENCOUNTER — Telehealth: Payer: Self-pay | Admitting: Family Medicine

## 2021-02-24 DIAGNOSIS — Z992 Dependence on renal dialysis: Secondary | ICD-10-CM | POA: Diagnosis not present

## 2021-02-24 DIAGNOSIS — D509 Iron deficiency anemia, unspecified: Secondary | ICD-10-CM | POA: Diagnosis not present

## 2021-02-24 DIAGNOSIS — N186 End stage renal disease: Secondary | ICD-10-CM | POA: Diagnosis not present

## 2021-02-24 DIAGNOSIS — D631 Anemia in chronic kidney disease: Secondary | ICD-10-CM | POA: Diagnosis not present

## 2021-02-24 DIAGNOSIS — N2581 Secondary hyperparathyroidism of renal origin: Secondary | ICD-10-CM | POA: Diagnosis not present

## 2021-02-24 NOTE — Telephone Encounter (Signed)
Copied from [place] [id number]. Topic: Medicare AWV >> Feb 24, 2021 11:00 AM Cher Nakai R wrote: Reason for CRM:   Left message for patient to call back and schedule the Medicare Annual Wellness Visit (AWV) virtually or by telephone.  Last AWV 01/23/2020  Please schedule at anytime with [facility].  40 minute appointment  Any questions, please call me at [phone]

## 2021-02-25 DIAGNOSIS — M0579 Rheumatoid arthritis with rheumatoid factor of multiple sites without organ or systems involvement: Secondary | ICD-10-CM | POA: Diagnosis not present

## 2021-02-25 DIAGNOSIS — L4050 Arthropathic psoriasis, unspecified: Secondary | ICD-10-CM | POA: Diagnosis not present

## 2021-02-26 ENCOUNTER — Encounter: Payer: Self-pay | Admitting: Family Medicine

## 2021-02-26 DIAGNOSIS — N186 End stage renal disease: Secondary | ICD-10-CM | POA: Diagnosis not present

## 2021-02-26 DIAGNOSIS — D509 Iron deficiency anemia, unspecified: Secondary | ICD-10-CM | POA: Diagnosis not present

## 2021-02-26 DIAGNOSIS — N2581 Secondary hyperparathyroidism of renal origin: Secondary | ICD-10-CM | POA: Diagnosis not present

## 2021-02-26 DIAGNOSIS — D631 Anemia in chronic kidney disease: Secondary | ICD-10-CM | POA: Diagnosis not present

## 2021-02-26 DIAGNOSIS — Z992 Dependence on renal dialysis: Secondary | ICD-10-CM | POA: Diagnosis not present

## 2021-02-28 DIAGNOSIS — N186 End stage renal disease: Secondary | ICD-10-CM | POA: Diagnosis not present

## 2021-02-28 DIAGNOSIS — Z992 Dependence on renal dialysis: Secondary | ICD-10-CM | POA: Diagnosis not present

## 2021-02-28 DIAGNOSIS — D631 Anemia in chronic kidney disease: Secondary | ICD-10-CM | POA: Diagnosis not present

## 2021-02-28 DIAGNOSIS — D509 Iron deficiency anemia, unspecified: Secondary | ICD-10-CM | POA: Diagnosis not present

## 2021-02-28 DIAGNOSIS — N2581 Secondary hyperparathyroidism of renal origin: Secondary | ICD-10-CM | POA: Diagnosis not present

## 2021-03-03 DIAGNOSIS — N186 End stage renal disease: Secondary | ICD-10-CM | POA: Diagnosis not present

## 2021-03-03 DIAGNOSIS — D631 Anemia in chronic kidney disease: Secondary | ICD-10-CM | POA: Diagnosis not present

## 2021-03-03 DIAGNOSIS — D509 Iron deficiency anemia, unspecified: Secondary | ICD-10-CM | POA: Diagnosis not present

## 2021-03-03 DIAGNOSIS — Z992 Dependence on renal dialysis: Secondary | ICD-10-CM | POA: Diagnosis not present

## 2021-03-03 DIAGNOSIS — N2581 Secondary hyperparathyroidism of renal origin: Secondary | ICD-10-CM | POA: Diagnosis not present

## 2021-03-04 ENCOUNTER — Other Ambulatory Visit: Payer: Self-pay

## 2021-03-04 ENCOUNTER — Ambulatory Visit (INDEPENDENT_AMBULATORY_CARE_PROVIDER_SITE_OTHER): Payer: Medicare Other | Admitting: Podiatry

## 2021-03-04 ENCOUNTER — Encounter: Payer: Self-pay | Admitting: Podiatry

## 2021-03-04 DIAGNOSIS — M79674 Pain in right toe(s): Secondary | ICD-10-CM | POA: Diagnosis not present

## 2021-03-04 DIAGNOSIS — B351 Tinea unguium: Secondary | ICD-10-CM

## 2021-03-04 DIAGNOSIS — L84 Corns and callosities: Secondary | ICD-10-CM | POA: Diagnosis not present

## 2021-03-04 DIAGNOSIS — M79675 Pain in left toe(s): Secondary | ICD-10-CM

## 2021-03-04 DIAGNOSIS — E1142 Type 2 diabetes mellitus with diabetic polyneuropathy: Secondary | ICD-10-CM | POA: Diagnosis not present

## 2021-03-04 NOTE — Progress Notes (Signed)
  Subjective:  Patient ID: [name], female    DOB: 04/01/1966,  MRN: [id number]  Chief Complaint  Patient presents with  . Nail Problem  . Callouses  . Diabetes    Nail, callous trim [facility]   She c/o of having arch pain and tightness at times   55 y.o. female returns for the above complaint.  Patient presents with thickened elongated dystrophic toenails x10.  Patient states it painful to walk on.  She would like to have the repair done.  She is diabetic with A1c of 7.9.  She also states that she has is hyperkeratotic lesion/preulcerative lesion to left submetatarsal 5.  Pain with ambulation.  She would like to have deliberate make sure that there is no ulceration underneath it.  She denies any other acute complaints.  Objective:  There were no vitals filed for this visit. Podiatric Exam: Vascular: dorsalis pedis and posterior tibial pulses are palpable bilateral. Capillary return is immediate. Temperature gradient is WNL. Skin turgor WNL  Sensorium: Normal Semmes Weinstein monofilament test. Normal tactile sensation bilaterally. Nail Exam: Pt has thick disfigured discolored nails with subungual debris noted bilateral entire nail hallux through fifth toenails.  Pain on palpation to the nails. Ulcer Exam: There is no evidence of ulcer or pre-ulcerative changes or infection. Orthopedic Exam: Muscle tone and strength are WNL. No limitations in general ROM. No crepitus or effusions noted. HAV  B/L.  Hammer toes 2-5  B/L. Skin: No Porokeratosis. No infection or ulcers.  Preulcerative lesion/callus formation noted to left submetatarsal 5    Assessment & Plan:   1. Pre-ulcerative calluses   2. Pain due to onychomycosis of toenails of both feet     Patient was evaluated and treated and all questions answered.  Left submetatarsal 5 preulcerative lesion -I explained the patient the etiology of preulcerative lesion various treatment options were discussed.  Patient is likely putting excessive  pressure to the submetatarsal 4 on 5 leading to large preulcerative lesion.  I aggressively debrided down to healthy striated tissue using chisel and a blade.  No ulceration noted.  Onychomycosis with pain  -Nails palliatively debrided as below. -Educated on self-care  Procedure: Nail Debridement Rationale: pain  Type of Debridement: manual, sharp debridement. Instrumentation: Nail nipper, rotary burr. Number of Nails: 10  Procedures and Treatment: Consent by patient was obtained for treatment procedures. The patient understood the discussion of treatment and procedures well. All questions were answered thoroughly reviewed. Debridement of mycotic and hypertrophic toenails, 1 through 5 bilateral and clearing of subungual debris. No ulceration, no infection noted.  Return Visit-Office Procedure: Patient instructed to return to the office for a follow up visit 3 months for continued evaluation and treatment.  Boneta Lucks, DPM    No follow-ups on file.

## 2021-03-05 DIAGNOSIS — D509 Iron deficiency anemia, unspecified: Secondary | ICD-10-CM | POA: Diagnosis not present

## 2021-03-05 DIAGNOSIS — N2581 Secondary hyperparathyroidism of renal origin: Secondary | ICD-10-CM | POA: Diagnosis not present

## 2021-03-05 DIAGNOSIS — Z992 Dependence on renal dialysis: Secondary | ICD-10-CM | POA: Diagnosis not present

## 2021-03-05 DIAGNOSIS — D631 Anemia in chronic kidney disease: Secondary | ICD-10-CM | POA: Diagnosis not present

## 2021-03-05 DIAGNOSIS — N186 End stage renal disease: Secondary | ICD-10-CM | POA: Diagnosis not present

## 2021-03-07 DIAGNOSIS — N2581 Secondary hyperparathyroidism of renal origin: Secondary | ICD-10-CM | POA: Diagnosis not present

## 2021-03-07 DIAGNOSIS — Z992 Dependence on renal dialysis: Secondary | ICD-10-CM | POA: Diagnosis not present

## 2021-03-07 DIAGNOSIS — N186 End stage renal disease: Secondary | ICD-10-CM | POA: Diagnosis not present

## 2021-03-07 DIAGNOSIS — D509 Iron deficiency anemia, unspecified: Secondary | ICD-10-CM | POA: Diagnosis not present

## 2021-03-07 DIAGNOSIS — D631 Anemia in chronic kidney disease: Secondary | ICD-10-CM | POA: Diagnosis not present

## 2021-03-10 DIAGNOSIS — N2581 Secondary hyperparathyroidism of renal origin: Secondary | ICD-10-CM | POA: Diagnosis not present

## 2021-03-10 DIAGNOSIS — D509 Iron deficiency anemia, unspecified: Secondary | ICD-10-CM | POA: Diagnosis not present

## 2021-03-10 DIAGNOSIS — D631 Anemia in chronic kidney disease: Secondary | ICD-10-CM | POA: Diagnosis not present

## 2021-03-10 DIAGNOSIS — Z992 Dependence on renal dialysis: Secondary | ICD-10-CM | POA: Diagnosis not present

## 2021-03-10 DIAGNOSIS — N186 End stage renal disease: Secondary | ICD-10-CM | POA: Diagnosis not present

## 2021-03-12 DIAGNOSIS — D631 Anemia in chronic kidney disease: Secondary | ICD-10-CM | POA: Diagnosis not present

## 2021-03-12 DIAGNOSIS — N2581 Secondary hyperparathyroidism of renal origin: Secondary | ICD-10-CM | POA: Diagnosis not present

## 2021-03-12 DIAGNOSIS — D509 Iron deficiency anemia, unspecified: Secondary | ICD-10-CM | POA: Diagnosis not present

## 2021-03-12 DIAGNOSIS — Z992 Dependence on renal dialysis: Secondary | ICD-10-CM | POA: Diagnosis not present

## 2021-03-12 DIAGNOSIS — N186 End stage renal disease: Secondary | ICD-10-CM | POA: Diagnosis not present

## 2021-03-14 DIAGNOSIS — N186 End stage renal disease: Secondary | ICD-10-CM | POA: Diagnosis not present

## 2021-03-14 DIAGNOSIS — Z992 Dependence on renal dialysis: Secondary | ICD-10-CM | POA: Diagnosis not present

## 2021-03-14 DIAGNOSIS — D509 Iron deficiency anemia, unspecified: Secondary | ICD-10-CM | POA: Diagnosis not present

## 2021-03-14 DIAGNOSIS — D631 Anemia in chronic kidney disease: Secondary | ICD-10-CM | POA: Diagnosis not present

## 2021-03-14 DIAGNOSIS — N2581 Secondary hyperparathyroidism of renal origin: Secondary | ICD-10-CM | POA: Diagnosis not present

## 2021-03-15 DIAGNOSIS — N2581 Secondary hyperparathyroidism of renal origin: Secondary | ICD-10-CM | POA: Diagnosis not present

## 2021-03-15 DIAGNOSIS — Z992 Dependence on renal dialysis: Secondary | ICD-10-CM | POA: Diagnosis not present

## 2021-03-15 DIAGNOSIS — N186 End stage renal disease: Secondary | ICD-10-CM | POA: Diagnosis not present

## 2021-03-15 DIAGNOSIS — D631 Anemia in chronic kidney disease: Secondary | ICD-10-CM | POA: Diagnosis not present

## 2021-03-15 DIAGNOSIS — D509 Iron deficiency anemia, unspecified: Secondary | ICD-10-CM | POA: Diagnosis not present

## 2021-03-17 ENCOUNTER — Other Ambulatory Visit: Payer: Self-pay | Admitting: Family Medicine

## 2021-03-17 DIAGNOSIS — D631 Anemia in chronic kidney disease: Secondary | ICD-10-CM | POA: Diagnosis not present

## 2021-03-17 DIAGNOSIS — N186 End stage renal disease: Secondary | ICD-10-CM | POA: Diagnosis not present

## 2021-03-17 DIAGNOSIS — D509 Iron deficiency anemia, unspecified: Secondary | ICD-10-CM | POA: Diagnosis not present

## 2021-03-17 DIAGNOSIS — N2581 Secondary hyperparathyroidism of renal origin: Secondary | ICD-10-CM | POA: Diagnosis not present

## 2021-03-17 DIAGNOSIS — Z992 Dependence on renal dialysis: Secondary | ICD-10-CM | POA: Diagnosis not present

## 2021-03-17 DIAGNOSIS — J00 Acute nasopharyngitis [common cold]: Secondary | ICD-10-CM

## 2021-03-19 ENCOUNTER — Other Ambulatory Visit: Payer: Self-pay | Admitting: Gastroenterology

## 2021-03-19 DIAGNOSIS — N2581 Secondary hyperparathyroidism of renal origin: Secondary | ICD-10-CM | POA: Diagnosis not present

## 2021-03-19 DIAGNOSIS — Z992 Dependence on renal dialysis: Secondary | ICD-10-CM | POA: Diagnosis not present

## 2021-03-19 DIAGNOSIS — N186 End stage renal disease: Secondary | ICD-10-CM | POA: Diagnosis not present

## 2021-03-19 DIAGNOSIS — D509 Iron deficiency anemia, unspecified: Secondary | ICD-10-CM | POA: Diagnosis not present

## 2021-03-19 DIAGNOSIS — D631 Anemia in chronic kidney disease: Secondary | ICD-10-CM | POA: Diagnosis not present

## 2021-03-20 ENCOUNTER — Other Ambulatory Visit: Payer: Self-pay | Admitting: Gastroenterology

## 2021-03-20 DIAGNOSIS — Z992 Dependence on renal dialysis: Secondary | ICD-10-CM | POA: Diagnosis not present

## 2021-03-20 DIAGNOSIS — N186 End stage renal disease: Secondary | ICD-10-CM | POA: Diagnosis not present

## 2021-03-21 DIAGNOSIS — N186 End stage renal disease: Secondary | ICD-10-CM | POA: Diagnosis not present

## 2021-03-21 DIAGNOSIS — Z992 Dependence on renal dialysis: Secondary | ICD-10-CM | POA: Diagnosis not present

## 2021-03-21 DIAGNOSIS — D631 Anemia in chronic kidney disease: Secondary | ICD-10-CM | POA: Diagnosis not present

## 2021-03-21 DIAGNOSIS — D509 Iron deficiency anemia, unspecified: Secondary | ICD-10-CM | POA: Diagnosis not present

## 2021-03-21 DIAGNOSIS — N2581 Secondary hyperparathyroidism of renal origin: Secondary | ICD-10-CM | POA: Diagnosis not present

## 2021-03-24 DIAGNOSIS — N186 End stage renal disease: Secondary | ICD-10-CM | POA: Diagnosis not present

## 2021-03-24 DIAGNOSIS — Z992 Dependence on renal dialysis: Secondary | ICD-10-CM | POA: Diagnosis not present

## 2021-03-24 DIAGNOSIS — D509 Iron deficiency anemia, unspecified: Secondary | ICD-10-CM | POA: Diagnosis not present

## 2021-03-24 DIAGNOSIS — D631 Anemia in chronic kidney disease: Secondary | ICD-10-CM | POA: Diagnosis not present

## 2021-03-24 DIAGNOSIS — N2581 Secondary hyperparathyroidism of renal origin: Secondary | ICD-10-CM | POA: Diagnosis not present

## 2021-03-25 DIAGNOSIS — L4050 Arthropathic psoriasis, unspecified: Secondary | ICD-10-CM | POA: Diagnosis not present

## 2021-03-25 DIAGNOSIS — M0579 Rheumatoid arthritis with rheumatoid factor of multiple sites without organ or systems involvement: Secondary | ICD-10-CM | POA: Diagnosis not present

## 2021-03-26 DIAGNOSIS — Z992 Dependence on renal dialysis: Secondary | ICD-10-CM | POA: Diagnosis not present

## 2021-03-26 DIAGNOSIS — D631 Anemia in chronic kidney disease: Secondary | ICD-10-CM | POA: Diagnosis not present

## 2021-03-26 DIAGNOSIS — N186 End stage renal disease: Secondary | ICD-10-CM | POA: Diagnosis not present

## 2021-03-26 DIAGNOSIS — N2581 Secondary hyperparathyroidism of renal origin: Secondary | ICD-10-CM | POA: Diagnosis not present

## 2021-03-26 DIAGNOSIS — D509 Iron deficiency anemia, unspecified: Secondary | ICD-10-CM | POA: Diagnosis not present

## 2021-03-28 DIAGNOSIS — Z992 Dependence on renal dialysis: Secondary | ICD-10-CM | POA: Diagnosis not present

## 2021-03-28 DIAGNOSIS — D509 Iron deficiency anemia, unspecified: Secondary | ICD-10-CM | POA: Diagnosis not present

## 2021-03-28 DIAGNOSIS — N2581 Secondary hyperparathyroidism of renal origin: Secondary | ICD-10-CM | POA: Diagnosis not present

## 2021-03-28 DIAGNOSIS — N186 End stage renal disease: Secondary | ICD-10-CM | POA: Diagnosis not present

## 2021-03-28 DIAGNOSIS — D631 Anemia in chronic kidney disease: Secondary | ICD-10-CM | POA: Diagnosis not present

## 2021-03-31 DIAGNOSIS — N2581 Secondary hyperparathyroidism of renal origin: Secondary | ICD-10-CM | POA: Diagnosis not present

## 2021-03-31 DIAGNOSIS — N186 End stage renal disease: Secondary | ICD-10-CM | POA: Diagnosis not present

## 2021-03-31 DIAGNOSIS — D631 Anemia in chronic kidney disease: Secondary | ICD-10-CM | POA: Diagnosis not present

## 2021-03-31 DIAGNOSIS — Z992 Dependence on renal dialysis: Secondary | ICD-10-CM | POA: Diagnosis not present

## 2021-03-31 DIAGNOSIS — D509 Iron deficiency anemia, unspecified: Secondary | ICD-10-CM | POA: Diagnosis not present

## 2021-04-01 ENCOUNTER — Other Ambulatory Visit: Payer: Self-pay

## 2021-04-01 ENCOUNTER — Encounter: Payer: Self-pay | Admitting: Student in an Organized Health Care Education/Training Program

## 2021-04-01 ENCOUNTER — Ambulatory Visit
Payer: Medicare Other | Attending: Student in an Organized Health Care Education/Training Program | Admitting: Student in an Organized Health Care Education/Training Program

## 2021-04-01 DIAGNOSIS — E1142 Type 2 diabetes mellitus with diabetic polyneuropathy: Secondary | ICD-10-CM | POA: Insufficient documentation

## 2021-04-01 DIAGNOSIS — G894 Chronic pain syndrome: Secondary | ICD-10-CM | POA: Diagnosis not present

## 2021-04-01 DIAGNOSIS — M5412 Radiculopathy, cervical region: Secondary | ICD-10-CM | POA: Diagnosis not present

## 2021-04-01 MED ORDER — OXYCODONE HCL 5 MG PO TABS: 5.0000 mg | ORAL_TABLET | Freq: Three times a day (TID) | ORAL | 0 refills | Status: DC | PRN

## 2021-04-01 NOTE — Progress Notes (Signed)
PROVIDER NOTE: Information contained herein reflects review and annotations entered in association with encounter. Interpretation of such information and data should be left to medically-trained personnel. Information provided to patient can be located elsewhere in the medical record under "Patient Instructions". Document created using STT-dictation technology, any transcriptional errors that may result from process are unintentional.    Patient: [name]  Service Category: E/M  Provider:  , MD  DOB: 11/14/1966  DOS: 04/01/2021  Specialty: Interventional Pain Management  MRN: [id number]  Setting: Ambulatory outpatient  PCP: Karamalegos, Alexander J, DO  Type: Established Patient    Referring Provider: Karamalegos, Alexander *  Location: Office  Delivery: Face-to-face     HPI  [name], a 55 y.o. year old female, is here today because of her No primary diagnosis found.. [name]'s primary complain today is chronic pain Last encounter: My last encounter with her was on 02/07/2021. Pertinent problems: [name] has ESRD (end stage renal disease) on dialysis (HCC); Type 2 diabetes mellitus with ESRD (end-stage renal disease) (HCC); Chronic bilateral low back pain; DDD (degenerative disc disease), cervical; Spondylosis of cervical region without myelopathy or radiculopathy; PAD (peripheral artery disease) (HCC); Primary osteoarthritis of right knee; Chronic pain syndrome; Diabetic polyneuropathy associated with type 2 diabetes mellitus (HCC); Cervical radiculopathy; Lumbar radiculopathy; GAD (generalized anxiety disorder); and Recurrent major depression in partial remission (HCC) on their pertinent problem list. Vitals:  height is 5' 7" (1.702 m) and weight is 228 lb (103.4 kg). Her temporal temperature is 97.1 F (36.2 C) (abnormal). Her blood pressure is 167/65 (abnormal) and her pulse is 65. Her respiration is 16 and oxygen saturation is 100%.   Reason for encounter: medication  management.    No change in medical history since last visit.  Patient's pain is at baseline.  Patient continues multimodal pain regimen as prescribed.  States that it provides pain relief and improvement in functional status.  Pharmacotherapy Assessment   Analgesic: Oxycodone 5 mg q8 hrs prn quantity 75/month; MME equals 22.5    Monitoring: [place] PMP: PDMP reviewed during this encounter.       Pharmacotherapy: No side-effects or adverse reactions reported. Compliance: No problems identified. Effectiveness: Clinically acceptable.  Wheatley, Dena L, RN  04/01/2021  9:41 AM  Sign when Signing Visit Nursing Pain Medication Assessment:  Safety precautions to be maintained throughout the outpatient stay will include: orient to surroundings, keep bed in low position, maintain call bell within reach at all times, provide assistance with transfer out of bed and ambulation.  Medication Inspection Compliance: Pill count conducted under aseptic conditions, in front of the patient. Neither the pills nor the bottle was removed from the patient's sight at any time. Once count was completed pills were immediately returned to the patient in their original bottle.  Medication: Oxycodone IR Pill/Patch Count: 44 of 75 pills remain Pill/Patch Appearance: Markings consistent with prescribed medication Bottle Appearance: Standard pharmacy container. Clearly labeled. Filled Date: 03/17/2021 Last Medication intake:  Day before yesterday       ROS  Constitutional: Denies any fever or chills Gastrointestinal: No reported hemesis, hematochezia, vomiting, or acute GI distress Musculoskeletal: low back pain Neurological: No reported episodes of acute onset apraxia, aphasia, dysarthria, agnosia, amnesia, paralysis, loss of coordination, or loss of consciousness  Medication Review  Abatacept, Adalimumab, INSULIN SYRINGE .5CC/29G, brimonidine, diltiazem, dorzolamidel-timolol, glucose blood, heparin, insulin aspart,  insulin glargine, ipratropium, ketoconazole, levocetirizine, midodrine, omeprazole, oxyCODONE, promethazine, and torsemide  History Review  Allergy: [name] is   allergic to cinnamon, garlic, onion, tylenol [acetaminophen], ciprofloxacin, and prednisone. Drug: [name]  reports no history of drug use. Alcohol:  reports no history of alcohol use. Tobacco:  reports that she has been smoking cigarettes. She has a 5.00 pack-year smoking history. She has never used smokeless tobacco. Social: [name]  reports that she has been smoking cigarettes. She has a 5.00 pack-year smoking history. She has never used smokeless tobacco. She reports that she does not drink alcohol and does not use drugs. Medical:  has a past medical history of Allergy, Anemia, Anxiety, Arthritis, Ataxia, COPD (chronic obstructive pulmonary disease) ([place]), Depression, Diabetes mellitus with complication ([place]), Dialysis patient ([place]), ESRD on hemodialysis ([place]), Fistula, Hepatitis (2003), Hiatal hernia, Hypercholesterolemia, Hypertension, Migraine, Neuropathy, diabetic ([place]), Non-alcoholic cirrhosis ([place]), Peripheral vascular disease ([place]), Pneumonia (2015), Psoriasis, Renal insufficiency, Tobacco dependence, and Wears dentures. Surgical: [name]  has a past surgical history that includes Tonsillectomy; rt. tubal and ovary removed; Cholecystectomy; Cardiac catheterization (N/A, 07/25/2015); Cardiac catheterization (Left, 07/25/2015); Cardiac catheterization (Left, 10/07/2015); Cardiac catheterization (N/A, 10/07/2015); Cardiac catheterization (10/07/2015); Cardiac catheterization (N/A, 12/17/2015); Incision and drainage abscess (N/A, 07/16/2016); cyst removed  from left hand (Left, 1989); AV fistula placement (Left, 11/2014); Cardiac catheterization (N/A, 01/11/2017); Cardiac catheterization (N/A, 01/11/2017); DIALYSIS/PERMA CATHETER INSERTION (N/A, 05/20/2017); Cesarean section; Tubal ligation; Colonoscopy (N/A, 09/22/2017);  Esophagogastroduodenoscopy (N/A, 09/22/2017); polypectomy (09/22/2017); A/V SHUNT INTERVENTION (N/A, 12/16/2017); STENT PLACEMENT VASCULAR ([facility] H[facility]) (Left, 03/2018); Upper Extremity Angiography (Left, 09/29/2019); Aqueous shunt (Left, 12/20/2019); Colonoscopy with propofol (N/A, 04/16/2020); and A/V Fistulagram (Left, 04/23/2020). Family: family history includes Heart disease in her mother; Hypertension in her father and mother; Skin cancer in her father.  Laboratory Chemistry Profile   Renal Lab Results  Component Value Date   BUN 52 (H) 05/01/2019   CREATININE 8.2 (A) 07/17/2020   BCR 10 12/27/2017   GFRAA 6 (L) 05/01/2019   GFRNONAA 6 (L) 05/01/2019     Hepatic Lab Results  Component Value Date   AST 15 07/17/2020   ALT 12 03/26/2020   ALBUMIN 4.0 07/17/2020   ALKPHOS 74 07/17/2020   HCVAB >11.0 (H) 12/16/2016   LIPASE 21 04/30/2019     Electrolytes Lab Results  Component Value Date   NA 137 07/17/2020   K 5.5 (A) 07/17/2020   CL 101 07/17/2020   CALCIUM 7.6 (A) 07/17/2020   MG 2.4 12/14/2017   PHOS 4.2 05/01/2019     Bone No results found for: VD25OH, VD125OH2TOT, TD3220UR4, YH0623JS2, 25OHVITD1, 25OHVITD2, 25OHVITD3, TESTOFREE, TESTOSTERONE   Inflammation (CRP: Acute Phase) (ESR: Chronic Phase) Lab Results  Component Value Date   CRP <0.8 12/16/2016   ESRSEDRATE 117 (H) 10/12/2017   LATICACIDVEN 1.6 04/30/2019       Note: Above Lab results reviewed.  Recent Imaging Review  DG Lumbar Spine Complete W/Bend CLINICAL DATA:  Chronic low back pain without known injury.  EXAM: LUMBAR SPINE - COMPLETE WITH BENDING VIEWS  COMPARISON:  None.  FINDINGS: No fracture or spondylolisthesis is noted. No change in vertebral body alignment is noted on flexion or extension views. Mild degenerative disc disease is noted at L1-2, L2-3 and L3-4.  IMPRESSION: Mild multilevel degenerative disc disease. No acute abnormality seen in the lumbar spine.  Electronically Signed    By: Marijo Conception M.D.   On: 02/05/2021 08:27 Note: Reviewed        Physical Exam  General appearance: Well nourished, well developed, and well hydrated. In no apparent acute distress Mental status: Alert, oriented x 3 (  person, place, & time)       Respiratory: No evidence of acute respiratory distress Eyes: PERLA Vitals: BP (!) 167/65   Pulse 65   Temp (!) 97.1 F (36.2 C) (Temporal)   Resp 16   Ht 5' 7" (1.702 m)   Wt 228 lb (103.4 kg)   SpO2 100%   BMI 35.71 kg/m  BMI: Estimated body mass index is 35.71 kg/m as calculated from the following:   Height as of this encounter: 5' 7" (1.702 m).   Weight as of this encounter: 228 lb (103.4 kg). Ideal: Ideal body weight: 61.6 kg (135 lb 12.9 oz) Adjusted ideal body weight: 78.3 kg (172 lb 10.9 oz)   Lumbar Spine Area Exam  Skin & Axial Inspection: No masses, redness, or swelling Alignment: Symmetrical Functional ROM: Pain restricted ROM affecting both sides Stability: No instability detected Muscle Tone/Strength: Functionally intact. No obvious neuro-muscular anomalies detected. Sensory (Neurological): Musculoskeletal pain pattern  Provocative Tests: Hyperextension/rotation test: (+) bilaterally for facet joint pain. Lumbar quadrant test (Kemp's test): (+) bilaterally for facet joint pain.  Gait & Posture Assessment  Ambulation: Unassisted Gait: Antalgic gait (limping) Posture: Difficulty standing up straight, due to pain    Assessment   Status Diagnosis  Controlled Controlled Controlled 1. Diabetic polyneuropathy associated with type 2 diabetes mellitus (HCC)   2. Cervical radiculopathy   3. Chronic pain syndrome        Plan of Care  [name] has a current medication list which includes the following long-term medication(s): orencia, diltiazem, insulin aspart, insulin glargine, ipratropium, levocetirizine, [START ON 04/16/2021] oxycodone, [START ON 05/16/2021] oxycodone, and [START ON 06/15/2021]  oxycodone.  Pharmacotherapy (Medications Ordered): Meds ordered this encounter  Medications  . oxyCODONE (OXY IR/ROXICODONE) 5 MG immediate release tablet    Sig: Take 1 tablet (5 mg total) by mouth every 8 (eight) hours as needed for severe pain. Must last 30 days.    Dispense:  75 tablet    Refill:  0    Chronic Pain. (STOP Act - Not applicable). Fill one day early if closed on scheduled refill date.  Marland Kitchen oxyCODONE (OXY IR/ROXICODONE) 5 MG immediate release tablet    Sig: Take 1 tablet (5 mg total) by mouth every 8 (eight) hours as needed for severe pain. Must last 30 days.    Dispense:  75 tablet    Refill:  0    Chronic Pain. (STOP Act - Not applicable). Fill one day early if closed on scheduled refill date.  Marland Kitchen oxyCODONE (OXY IR/ROXICODONE) 5 MG immediate release tablet    Sig: Take 1 tablet (5 mg total) by mouth every 8 (eight) hours as needed for severe pain. Must last 30 days.    Dispense:  75 tablet    Refill:  0    Chronic Pain. (STOP Act - Not applicable). Fill one day early if closed on scheduled refill date.   Follow-up plan:   Return in about 3 months (around 07/01/2021) for Medication Management, in person.   Recent Visits Date Type Provider Dept  02/04/21 Office Visit Gillis Santa, MD [facility]  Showing recent visits within past 90 days and meeting all other requirements Today's Visits Date Type Provider Dept  04/01/21 Office Visit Gillis Santa, MD [facility]  Showing today's visits and meeting all other requirements Future Appointments No visits were found meeting these conditions. Showing future appointments within next 90 days and meeting all other requirements  I discussed the assessment  and treatment plan with the patient. The patient was provided an opportunity to ask questions and all were answered. The patient agreed with the plan and demonstrated an understanding of the instructions.  Patient advised to call back or seek an in-person  evaluation if the symptoms or condition worsens.  Duration of encounter:30 minutes.  Note by:  , MD Date: 04/01/2021; Time: 10:05 AM 

## 2021-04-01 NOTE — Progress Notes (Signed)
Nursing Pain Medication Assessment:  Safety precautions to be maintained throughout the outpatient stay will include: orient to surroundings, keep bed in low position, maintain call bell within reach at all times, provide assistance with transfer out of bed and ambulation.  Medication Inspection Compliance: Pill count conducted under aseptic conditions, in front of the patient. Neither the pills nor the bottle was removed from the patient's sight at any time. Once count was completed pills were immediately returned to the patient in their original bottle.  Medication: Oxycodone IR Pill/Patch Count: 44 of 75 pills remain Pill/Patch Appearance: Markings consistent with prescribed medication Bottle Appearance: Standard pharmacy container. Clearly labeled. Filled Date: 03/17/2021 Last Medication intake:  Day before yesterday

## 2021-04-02 DIAGNOSIS — N186 End stage renal disease: Secondary | ICD-10-CM | POA: Diagnosis not present

## 2021-04-02 DIAGNOSIS — D509 Iron deficiency anemia, unspecified: Secondary | ICD-10-CM | POA: Diagnosis not present

## 2021-04-02 DIAGNOSIS — Z992 Dependence on renal dialysis: Secondary | ICD-10-CM | POA: Diagnosis not present

## 2021-04-02 DIAGNOSIS — D631 Anemia in chronic kidney disease: Secondary | ICD-10-CM | POA: Diagnosis not present

## 2021-04-02 DIAGNOSIS — N2581 Secondary hyperparathyroidism of renal origin: Secondary | ICD-10-CM | POA: Diagnosis not present

## 2021-04-04 DIAGNOSIS — Z992 Dependence on renal dialysis: Secondary | ICD-10-CM | POA: Diagnosis not present

## 2021-04-04 DIAGNOSIS — N186 End stage renal disease: Secondary | ICD-10-CM | POA: Diagnosis not present

## 2021-04-04 DIAGNOSIS — N2581 Secondary hyperparathyroidism of renal origin: Secondary | ICD-10-CM | POA: Diagnosis not present

## 2021-04-04 DIAGNOSIS — D631 Anemia in chronic kidney disease: Secondary | ICD-10-CM | POA: Diagnosis not present

## 2021-04-04 DIAGNOSIS — D509 Iron deficiency anemia, unspecified: Secondary | ICD-10-CM | POA: Diagnosis not present

## 2021-04-07 DIAGNOSIS — Z992 Dependence on renal dialysis: Secondary | ICD-10-CM | POA: Diagnosis not present

## 2021-04-07 DIAGNOSIS — D509 Iron deficiency anemia, unspecified: Secondary | ICD-10-CM | POA: Diagnosis not present

## 2021-04-07 DIAGNOSIS — N2581 Secondary hyperparathyroidism of renal origin: Secondary | ICD-10-CM | POA: Diagnosis not present

## 2021-04-07 DIAGNOSIS — N186 End stage renal disease: Secondary | ICD-10-CM | POA: Diagnosis not present

## 2021-04-07 DIAGNOSIS — D631 Anemia in chronic kidney disease: Secondary | ICD-10-CM | POA: Diagnosis not present

## 2021-04-09 DIAGNOSIS — D509 Iron deficiency anemia, unspecified: Secondary | ICD-10-CM | POA: Diagnosis not present

## 2021-04-09 DIAGNOSIS — Z992 Dependence on renal dialysis: Secondary | ICD-10-CM | POA: Diagnosis not present

## 2021-04-09 DIAGNOSIS — D631 Anemia in chronic kidney disease: Secondary | ICD-10-CM | POA: Diagnosis not present

## 2021-04-09 DIAGNOSIS — N2581 Secondary hyperparathyroidism of renal origin: Secondary | ICD-10-CM | POA: Diagnosis not present

## 2021-04-09 DIAGNOSIS — N186 End stage renal disease: Secondary | ICD-10-CM | POA: Diagnosis not present

## 2021-04-10 DIAGNOSIS — T82858A Stenosis of vascular prosthetic devices, implants and grafts, initial encounter: Secondary | ICD-10-CM | POA: Diagnosis not present

## 2021-04-10 DIAGNOSIS — N186 End stage renal disease: Secondary | ICD-10-CM | POA: Diagnosis not present

## 2021-04-10 DIAGNOSIS — Z972 Presence of dental prosthetic device (complete) (partial): Secondary | ICD-10-CM | POA: Diagnosis not present

## 2021-04-10 DIAGNOSIS — I871 Compression of vein: Secondary | ICD-10-CM | POA: Diagnosis not present

## 2021-04-10 DIAGNOSIS — I120 Hypertensive chronic kidney disease with stage 5 chronic kidney disease or end stage renal disease: Secondary | ICD-10-CM | POA: Diagnosis not present

## 2021-04-10 DIAGNOSIS — E1122 Type 2 diabetes mellitus with diabetic chronic kidney disease: Secondary | ICD-10-CM | POA: Diagnosis not present

## 2021-04-10 DIAGNOSIS — D631 Anemia in chronic kidney disease: Secondary | ICD-10-CM | POA: Diagnosis not present

## 2021-04-10 DIAGNOSIS — K219 Gastro-esophageal reflux disease without esophagitis: Secondary | ICD-10-CM | POA: Diagnosis not present

## 2021-04-10 DIAGNOSIS — Z992 Dependence on renal dialysis: Secondary | ICD-10-CM | POA: Diagnosis not present

## 2021-04-10 DIAGNOSIS — T82898A Other specified complication of vascular prosthetic devices, implants and grafts, initial encounter: Secondary | ICD-10-CM | POA: Diagnosis not present

## 2021-04-10 DIAGNOSIS — Z720 Tobacco use: Secondary | ICD-10-CM | POA: Diagnosis not present

## 2021-04-10 DIAGNOSIS — Z794 Long term (current) use of insulin: Secondary | ICD-10-CM | POA: Diagnosis not present

## 2021-04-11 DIAGNOSIS — D631 Anemia in chronic kidney disease: Secondary | ICD-10-CM | POA: Diagnosis not present

## 2021-04-11 DIAGNOSIS — N2581 Secondary hyperparathyroidism of renal origin: Secondary | ICD-10-CM | POA: Diagnosis not present

## 2021-04-11 DIAGNOSIS — Z992 Dependence on renal dialysis: Secondary | ICD-10-CM | POA: Diagnosis not present

## 2021-04-11 DIAGNOSIS — N186 End stage renal disease: Secondary | ICD-10-CM | POA: Diagnosis not present

## 2021-04-11 DIAGNOSIS — D509 Iron deficiency anemia, unspecified: Secondary | ICD-10-CM | POA: Diagnosis not present

## 2021-04-14 DIAGNOSIS — N186 End stage renal disease: Secondary | ICD-10-CM | POA: Diagnosis not present

## 2021-04-14 DIAGNOSIS — Z794 Long term (current) use of insulin: Secondary | ICD-10-CM | POA: Diagnosis not present

## 2021-04-14 DIAGNOSIS — D509 Iron deficiency anemia, unspecified: Secondary | ICD-10-CM | POA: Diagnosis not present

## 2021-04-14 DIAGNOSIS — N2581 Secondary hyperparathyroidism of renal origin: Secondary | ICD-10-CM | POA: Diagnosis not present

## 2021-04-14 DIAGNOSIS — D631 Anemia in chronic kidney disease: Secondary | ICD-10-CM | POA: Diagnosis not present

## 2021-04-14 DIAGNOSIS — Z992 Dependence on renal dialysis: Secondary | ICD-10-CM | POA: Diagnosis not present

## 2021-04-14 DIAGNOSIS — E119 Type 2 diabetes mellitus without complications: Secondary | ICD-10-CM | POA: Diagnosis not present

## 2021-04-16 ENCOUNTER — Other Ambulatory Visit: Payer: Self-pay | Admitting: Family Medicine

## 2021-04-16 ENCOUNTER — Telehealth: Payer: Self-pay | Admitting: Family Medicine

## 2021-04-16 DIAGNOSIS — N2581 Secondary hyperparathyroidism of renal origin: Secondary | ICD-10-CM | POA: Diagnosis not present

## 2021-04-16 DIAGNOSIS — D509 Iron deficiency anemia, unspecified: Secondary | ICD-10-CM | POA: Diagnosis not present

## 2021-04-16 DIAGNOSIS — Z992 Dependence on renal dialysis: Secondary | ICD-10-CM | POA: Diagnosis not present

## 2021-04-16 DIAGNOSIS — D631 Anemia in chronic kidney disease: Secondary | ICD-10-CM | POA: Diagnosis not present

## 2021-04-16 DIAGNOSIS — N186 End stage renal disease: Secondary | ICD-10-CM | POA: Diagnosis not present

## 2021-04-16 DIAGNOSIS — I10 Essential (primary) hypertension: Secondary | ICD-10-CM

## 2021-04-16 DIAGNOSIS — E1122 Type 2 diabetes mellitus with diabetic chronic kidney disease: Secondary | ICD-10-CM

## 2021-04-16 DIAGNOSIS — J00 Acute nasopharyngitis [common cold]: Secondary | ICD-10-CM

## 2021-04-16 NOTE — Telephone Encounter (Unsigned)
Copied from [place] [id number] [phone]. Topic: Quick Communication - Rx Refill/Question >> Apr 16, 2021 10:46 AM Yvette Rack wrote: Medication: levocetirizine (XYZAL) 5 MG tablet  Has the patient contacted their pharmacy? yes - Pharmacy called to request refill  Preferred Pharmacy (with phone number or street name): [facility], [place]  Phone: [phone]  Fax: [phone]  Agent: Please be advised that RX refills may take up to 3 business days. We ask that you follow-up with your pharmacy.

## 2021-04-18 DIAGNOSIS — N2581 Secondary hyperparathyroidism of renal origin: Secondary | ICD-10-CM | POA: Diagnosis not present

## 2021-04-18 DIAGNOSIS — D631 Anemia in chronic kidney disease: Secondary | ICD-10-CM | POA: Diagnosis not present

## 2021-04-18 DIAGNOSIS — Z992 Dependence on renal dialysis: Secondary | ICD-10-CM | POA: Diagnosis not present

## 2021-04-18 DIAGNOSIS — N186 End stage renal disease: Secondary | ICD-10-CM | POA: Diagnosis not present

## 2021-04-18 DIAGNOSIS — D509 Iron deficiency anemia, unspecified: Secondary | ICD-10-CM | POA: Diagnosis not present

## 2021-04-19 DIAGNOSIS — N186 End stage renal disease: Secondary | ICD-10-CM | POA: Diagnosis not present

## 2021-04-19 DIAGNOSIS — Z992 Dependence on renal dialysis: Secondary | ICD-10-CM | POA: Diagnosis not present

## 2021-05-13 ENCOUNTER — Encounter: Payer: Self-pay | Admitting: Podiatry

## 2021-05-13 ENCOUNTER — Other Ambulatory Visit: Payer: Self-pay

## 2021-05-13 ENCOUNTER — Ambulatory Visit (INDEPENDENT_AMBULATORY_CARE_PROVIDER_SITE_OTHER): Payer: 59 | Admitting: Podiatry

## 2021-05-13 VITALS — Temp 97.4°F

## 2021-05-13 DIAGNOSIS — E0843 Diabetes mellitus due to underlying condition with diabetic autonomic (poly)neuropathy: Secondary | ICD-10-CM

## 2021-05-13 DIAGNOSIS — L97522 Non-pressure chronic ulcer of other part of left foot with fat layer exposed: Secondary | ICD-10-CM | POA: Diagnosis not present

## 2021-05-13 MED ORDER — GENTAMICIN SULFATE 0.1 % EX CREA: 1.0000 "application " | TOPICAL_CREAM | Freq: Two times a day (BID) | CUTANEOUS | 1 refills | Status: DC

## 2021-05-13 NOTE — Progress Notes (Signed)
Subjective:  55 y.o. female with PMHx of diabetes mellitus presenting today for evaluation of a possible ulcer to the left plantar forefoot.  Patient states that this wound began approximately 5 years ago when she burned her foot on asphalt.  She develops a recurrent callus in this area however the nurses at dialysis saw and recommended that she follow-up immediately due to concern of infection and wound.  She presents for further treatment and evaluation   Past Medical History:  Diagnosis Date  . Allergy   . Anemia   . Anxiety    worse when not at home - bowel incontinence  . Arthritis   . Ataxia   . COPD (chronic obstructive pulmonary disease) ([place])   . Depression   . Diabetes mellitus with complication ([place])   . Dialysis patient ([place])   . ESRD on hemodialysis ([place])    MWF dialysis  . Fistula    left upper arm  . Hepatitis 2003   Hep C  . Hiatal hernia   . Hypercholesterolemia   . Hypertension   . Migraine   . Neuropathy, diabetic (HCC)    lower legs  . Non-alcoholic cirrhosis ([place])   . Peripheral vascular disease ([place])   . Pneumonia 2015  . Psoriasis   . Renal insufficiency   . Tobacco dependence   . Wears dentures    full upper, partial lower        Objective/Physical Exam General: The patient is alert and oriented x3 in no acute distress.  Dermatology:  Wound #1 noted to the plantar aspect of the left forefoot measuring approximately 3.0x3.0 x 0.2 cm (LxWxD).   To the noted ulceration(s), there is no eschar. There is a moderate amount of slough, fibrin, and necrotic tissue noted. Granulation tissue and wound base is red. There is a minimal amount of serosanguineous drainage noted. There is no exposed bone muscle-tendon ligament or joint. There is no malodor. Periwound integrity is intact. Skin is warm, dry and supple bilateral lower extremities.  Vascular: Palpable pedal pulses bilaterally. No edema or erythema noted. Capillary refill within normal  limits.  Neurological: Epicritic and protective threshold diminished bilaterally.   Musculoskeletal Exam: No pedal deformities noted  Assessment: 1.  Ulcer left plantar forefoot secondary to diabetes mellitus 2. diabetes mellitus w/ peripheral neuropathy   Plan of Care:  1. Patient was evaluated. 2. medically necessary excisional debridement including subcutaneous tissue was performed using a tissue nipper and a chisel blade. Excisional debridement of all the necrotic nonviable tissue down to healthy bleeding viable tissue was performed with post-debridement measurements same as pre-. 3. the wound was cleansed and dry sterile dressing applied. 4.  Prescription for gentamicin cream applied daily with a light dressing  5.  Continue diabetic shoes and insoles.  Offloading felt dancers pads were applied to the insoles of the shoe to help alleviate pressure from the forefoot  6.  Patient is to return to clinic in 4 weeks   Edrick Kins, DPM [facility]  Dr. Edrick Kins, DPM    [address], [place] [address]                Office [phone]  Fax [phone]

## 2021-05-20 ENCOUNTER — Telehealth: Payer: Self-pay | Admitting: Student in an Organized Health Care Education/Training Program

## 2021-05-20 NOTE — Telephone Encounter (Signed)
Done and approved

## 2021-05-20 NOTE — Telephone Encounter (Signed)
Patient needs PA for Oxycodone. 

## 2021-05-29 ENCOUNTER — Other Ambulatory Visit: Payer: Self-pay | Admitting: Family Medicine

## 2021-05-29 ENCOUNTER — Other Ambulatory Visit: Payer: Self-pay | Admitting: Podiatry

## 2021-05-29 DIAGNOSIS — I10 Essential (primary) hypertension: Secondary | ICD-10-CM

## 2021-05-29 DIAGNOSIS — E1122 Type 2 diabetes mellitus with diabetic chronic kidney disease: Secondary | ICD-10-CM

## 2021-05-29 NOTE — Telephone Encounter (Signed)
Requested medication (s) are due for refill today:   Yes for both  Requested medication (s) are on the active medication list:   Yes for both  Future visit scheduled:   Yes   Last ordered: Novolog insulin pharmacy requesting an alternative; 04/16/2021 10 ml, 0 refills;     Cardizem 04/16/2021 390, 0 refills only 1 month given.    Provider to review for refills.   Requested Prescriptions  Pending Prescriptions Disp Refills   NOVOLOG 100 UNIT/ML injection [Pharmacy Med Name: Novolog U-100 Insulin aspart 100 unit/mL subcutaneous solution] 10 mL 0    Sig: INJECT 2 UNITS SUBCUTANEOUSLY 3 TIMES A DAY BEFORE MEALS      Endocrinology:  Diabetes - Insulins Passed - 05/29/2021  1:08 PM      Passed - HBA1C is between 0 and 7.9 and within 180 days    Hemoglobin A1C  Date Value Ref Range Status  01/13/2021 7.7  Final          Passed - Valid encounter within last 6 months    Recent Outpatient Visits           3 months ago Type 2 diabetes mellitus with ESRD (end-stage renal disease) ([place])   [facility], [name], DO   10 months ago Type 2 diabetes mellitus with ESRD (end-stage renal disease) ([place])   [facility] Olin Hauser, DO   1 year ago Type 2 diabetes mellitus with ESRD (end-stage renal disease) ([place])   [facility] Olin Hauser, DO   2 years ago Type 2 diabetes mellitus with ESRD (end-stage renal disease) ([place])   [facility] Olin Hauser, DO   2 years ago General medical exam   [place], DO       Future Appointments             In 2 months [facility], [name], [facility]               diltiazem (CARDIZEM) 30 MG tablet [Pharmacy Med Name: diltiazem 30 mg tablet] 90 tablet 0    Sig: TAKE ONE TABLET BY MOUTH THREE TIMES DAILY MAY REDUCE TO 1/2 TAB AS PREFERRED DAY BEFORE HEMODIALYSIS. SKIP DOSE  MORNING OF HD      Cardiovascular:  Calcium Channel Blockers Failed - 05/29/2021  1:08 PM      Failed - Last BP in normal range    BP Readings from Last 1 Encounters:  04/01/21 (!) 167/65          Passed - Valid encounter within last 6 months    Recent Outpatient Visits           3 months ago Type 2 diabetes mellitus with ESRD (end-stage renal disease) ([place])   [facility], [name], DO   10 months ago Type 2 diabetes mellitus with ESRD (end-stage renal disease) ([place])   [facility] Olin Hauser, DO   1 year ago Type 2 diabetes mellitus with ESRD (end-stage renal disease) ([place])   [facility] Olin Hauser, DO   2 years ago Type 2 diabetes mellitus with ESRD (end-stage renal disease) ([place])   [facility], [name], DO   2 years ago General medical exam   [facility] Olin Hauser, DO       Future Appointments  In 2 months [facility], [facility], [facility]

## 2021-06-05 ENCOUNTER — Ambulatory Visit (INDEPENDENT_AMBULATORY_CARE_PROVIDER_SITE_OTHER): Payer: 59 | Admitting: Podiatry

## 2021-06-05 ENCOUNTER — Other Ambulatory Visit: Payer: Self-pay

## 2021-06-05 ENCOUNTER — Encounter: Payer: Self-pay | Admitting: Podiatry

## 2021-06-05 DIAGNOSIS — L84 Corns and callosities: Secondary | ICD-10-CM | POA: Diagnosis not present

## 2021-06-05 DIAGNOSIS — M79674 Pain in right toe(s): Secondary | ICD-10-CM

## 2021-06-05 DIAGNOSIS — M79675 Pain in left toe(s): Secondary | ICD-10-CM

## 2021-06-05 DIAGNOSIS — B351 Tinea unguium: Secondary | ICD-10-CM | POA: Diagnosis not present

## 2021-06-05 DIAGNOSIS — E0843 Diabetes mellitus due to underlying condition with diabetic autonomic (poly)neuropathy: Secondary | ICD-10-CM

## 2021-06-05 DIAGNOSIS — Z7189 Other specified counseling: Secondary | ICD-10-CM

## 2021-06-05 NOTE — Progress Notes (Signed)
  Subjective:  Patient ID: [name], female    DOB: 1966-10-13,  MRN: [id number]  Chief Complaint  Patient presents with   Diabetic Ulcer   Nail Problem    Nail trim [facility], follow up ulcer   55 y.o. female returns for the above complaint.  Patient presents with thickened elongated dystrophic toenails x10.  Patient states it painful to walk on.  She would like to have the repair done.  She is diabetic with A1c of 7.9.  She also states that she has is hyperkeratotic lesion/preulcerative lesion to left submetatarsal 5.  Pain with ambulation.  She would like to have deliberate make sure that there is no ulceration underneath it.  She also wants to know the best way to control her sugars and prevent her from going up and elevation.  Objective:  There were no vitals filed for this visit. Podiatric Exam: Vascular: dorsalis pedis and posterior tibial pulses are palpable bilateral. Capillary return is immediate. Temperature gradient is WNL. Skin turgor WNL  Sensorium: Diminished emmes Weinstein monofilament test.  Diminished actile sensation bilaterally. Nail Exam: Pt has thick disfigured discolored nails with subungual debris noted bilateral entire nail hallux through fifth toenails.  Pain on palpation to the nails. Ulcer Exam: There is no evidence of ulcer or pre-ulcerative changes or infection. Orthopedic Exam: Muscle tone and strength are WNL. No limitations in general ROM. No crepitus or effusions noted. HAV  B/L.  Hammer toes 2-5  B/L. Skin: No Porokeratosis. No infection or ulcers.  Preulcerative lesion/callus formation noted to left submetatarsal 5    Assessment & Plan:   1. Pre-ulcerative calluses   2. Diabetes mellitus due to underlying condition with diabetic autonomic neuropathy, unspecified whether long term insulin use ([place])   3. Encounter for diabetes education   4. Pain due to onychomycosis of toenails of both feet      Patient was evaluated and treated and all questions  answered.  Diabetic education -I explained the patient etiology of diabetes and various treatment options were discussed.  I discussed with her given that her sugars are getting elevated with A1c rising to 7.7 I discussed diet control and sugar management.  I discussed with her in this relationship with foot care in extensive detail.  She states understanding and will work on bringing her sugar levels down.  Left submetatarsal 5 preulcerative lesion -I explained the patient the etiology of preulcerative lesion various treatment options were discussed.  Patient is likely putting excessive pressure to the submetatarsal 4 on 5 leading to large preulcerative lesion.  I aggressively debrided down to healthy striated tissue using chisel and a blade.  No ulceration noted.  Onychomycosis with pain  -Nails palliatively debrided as below. -Educated on self-care  Procedure: Nail Debridement Rationale: pain  Type of Debridement: manual, sharp debridement. Instrumentation: Nail nipper, rotary burr. Number of Nails: 10  Procedures and Treatment: Consent by patient was obtained for treatment procedures. The patient understood the discussion of treatment and procedures well. All questions were answered thoroughly reviewed. Debridement of mycotic and hypertrophic toenails, 1 through 5 bilateral and clearing of subungual debris. No ulceration, no infection noted.  Return Visit-Office Procedure: Patient instructed to return to the office for a follow up visit 3 months for continued evaluation and treatment.  Boneta Lucks, DPM    No follow-ups on file.

## 2021-06-11 ENCOUNTER — Other Ambulatory Visit: Payer: Self-pay | Admitting: Family Medicine

## 2021-06-11 DIAGNOSIS — N186 End stage renal disease: Secondary | ICD-10-CM

## 2021-07-03 ENCOUNTER — Telehealth: Payer: Self-pay | Admitting: Family Medicine

## 2021-07-03 DIAGNOSIS — I10 Essential (primary) hypertension: Secondary | ICD-10-CM

## 2021-07-03 DIAGNOSIS — E1122 Type 2 diabetes mellitus with diabetic chronic kidney disease: Secondary | ICD-10-CM

## 2021-07-03 DIAGNOSIS — N186 End stage renal disease: Secondary | ICD-10-CM

## 2021-07-03 NOTE — Telephone Encounter (Signed)
Copied from [place] [id number]. Topic: General - Other >> Jul 03, 2021 12:07 PM Erick Blinks wrote: Reason for CRM: Pt called to report that she switched to [facility] and they will not cover her Novolog. She was advised to contact her PCP to request for her to continue to the Rx or switch the medication altogether.

## 2021-07-03 NOTE — Telephone Encounter (Signed)
Pt also reports that she is out of her insulin, she states she does not have transportation, she is disabled visibly and physically.   Requesting samples of insulin because she does not have the money to get a $25 bottle from the store she says.   [phone]

## 2021-07-04 ENCOUNTER — Other Ambulatory Visit: Payer: Self-pay | Admitting: Family Medicine

## 2021-07-04 DIAGNOSIS — I10 Essential (primary) hypertension: Secondary | ICD-10-CM

## 2021-07-04 DIAGNOSIS — J00 Acute nasopharyngitis [common cold]: Secondary | ICD-10-CM

## 2021-07-04 NOTE — Telephone Encounter (Signed)
I have submitted referral to our Chronic Care Management team Case Management / Pharmacy and also Care Guides referral for financial barrier and transportation.  We do not have samples of Novolog insulin and I do not believe samples are a long term solution to these barriers.  We will do our best to get her financial assistance and transportation help. She may be setup with [facility] as well.  Please let her know - Both referrals will call her soon once established to address her issues.  Nobie Putnam, [facility] 07/04/2021, 10:24 AM

## 2021-07-08 ENCOUNTER — Ambulatory Visit
Payer: 59 | Attending: Student in an Organized Health Care Education/Training Program | Admitting: Student in an Organized Health Care Education/Training Program

## 2021-07-08 ENCOUNTER — Telehealth: Payer: Self-pay

## 2021-07-08 ENCOUNTER — Other Ambulatory Visit: Payer: Self-pay

## 2021-07-08 ENCOUNTER — Encounter: Payer: Self-pay | Admitting: Student in an Organized Health Care Education/Training Program

## 2021-07-08 VITALS — BP 172/81 | HR 78 | Temp 96.7°F | Resp 18 | Ht 67.0 in | Wt 227.0 lb

## 2021-07-08 DIAGNOSIS — G894 Chronic pain syndrome: Secondary | ICD-10-CM | POA: Insufficient documentation

## 2021-07-08 DIAGNOSIS — M5416 Radiculopathy, lumbar region: Secondary | ICD-10-CM | POA: Diagnosis present

## 2021-07-08 DIAGNOSIS — E1142 Type 2 diabetes mellitus with diabetic polyneuropathy: Secondary | ICD-10-CM | POA: Diagnosis present

## 2021-07-08 DIAGNOSIS — M5030 Other cervical disc degeneration, unspecified cervical region: Secondary | ICD-10-CM

## 2021-07-08 DIAGNOSIS — M47816 Spondylosis without myelopathy or radiculopathy, lumbar region: Secondary | ICD-10-CM | POA: Insufficient documentation

## 2021-07-08 DIAGNOSIS — M65342 Trigger finger, left ring finger: Secondary | ICD-10-CM | POA: Diagnosis present

## 2021-07-08 DIAGNOSIS — M5412 Radiculopathy, cervical region: Secondary | ICD-10-CM | POA: Diagnosis present

## 2021-07-08 MED ORDER — OXYCODONE HCL 5 MG PO TABS: 5.0000 mg | ORAL_TABLET | Freq: Three times a day (TID) | ORAL | 0 refills | Status: DC | PRN

## 2021-07-08 NOTE — Progress Notes (Signed)
PROVIDER NOTE: Information contained herein reflects review and annotations entered in association with encounter. Interpretation of such information and data should be left to medically-trained personnel. Information provided to patient can be located elsewhere in the medical record under "Patient Instructions". Document created using STT-dictation technology, any transcriptional errors that may result from process are unintentional.    Patient: [name]  Service Category: E/M  Provider: Gillis Santa, MD  DOB: February 27, 1966  DOS: 07/08/2021  Specialty: Interventional Pain Management  MRN: [id number]  Setting: Ambulatory outpatient  PCP: Olin Hauser, DO  Type: Established Patient    Referring Provider: Nobie Putnam *  Location: Office  Delivery: Face-to-face     HPI  [name], a 54 y.o. year old female, is here today because of her Diabetic polyneuropathy associated with type 2 diabetes mellitus ([place]) [E11.42]. Ms. Semidey primary complain today is chronic pain Last encounter: My last encounter with her was on 04/01/2021. Pertinent problems: Ms. Briney has ESRD (end stage renal disease) on dialysis [facility]); Type 2 diabetes mellitus with ESRD (end-stage renal disease) ([place]); Chronic bilateral low back pain; DDD (degenerative disc disease), cervical; Spondylosis of cervical region without myelopathy or radiculopathy; PAD (peripheral artery disease) ([place]); Primary osteoarthritis of right knee; Chronic pain syndrome; Diabetic polyneuropathy associated with type 2 diabetes mellitus ([place]); Cervical radiculopathy; Lumbar radiculopathy; GAD (generalized anxiety disorder); and Recurrent major depression in partial remission (HCC) on their pertinent problem list. Vitals:  height is _0  (1.702 m) and weight is 227 lb (103 kg). Her temperature is 96.7 F (35.9 C) (abnormal). Her blood pressure is 172/81 (abnormal) and her pulse is 78. Her respiration is 18 and oxygen saturation is 99%.    Reason for encounter: medication management.    No change in medical history since last visit.   Patient continues multimodal pain regimen as prescribed.  States that it provides pain relief and improvement in functional status. She is endorsing increased burning pain in both of her feet.  Her last A1c was greater than 8.  She is on dialysis given end-stage renal disease.  She goes Monday Wednesdays and Fridays. Today we discussed Qutenza topical therapy for diabetic peripheral neuropathy.  Risks and benefits were reviewed with patient and she would like to trial this.  Benefits of this treatment modality include nonsystemic absorption which minimizes the risk of polypharmacy, nonopioid therapy.   Pharmacotherapy Assessment   Analgesic: Oxycodone 5 mg q8 hrs prn quantity 75/month; MME equals 22.5    Monitoring: [place] PMP: PDMP reviewed during this encounter.       Pharmacotherapy: No side-effects or adverse reactions reported. Compliance: No problems identified. Effectiveness: Clinically acceptable.  Dewayne Shorter, RN  07/08/2021  8:41 AM  Sign when Signing Visit Nursing Pain Medication Assessment:  Safety precautions to be maintained throughout the outpatient stay will include: orient to surroundings, keep bed in low position, maintain call bell within reach at all times, provide assistance with transfer out of bed and ambulation.  Medication Inspection Compliance: Pill count conducted under aseptic conditions, in front of the patient. Neither the pills nor the bottle was removed from the patient's sight at any time. Once count was completed pills were immediately returned to the patient in their original bottle.  Medication: Oxycodone IR Pill/Patch Count:  23 of 75 pills remain Pill/Patch Appearance: Markings consistent with prescribed medication Bottle Appearance: Standard pharmacy container. Clearly labeled. Filled Date: 06 / 27 / 2022 Last Medication intake:  Today  ROS   Constitutional: Denies any fever or chills Gastrointestinal: No reported hemesis, hematochezia, vomiting, or acute GI distress Musculoskeletal:  low back pain Neurological: No reported episodes of acute onset apraxia, aphasia, dysarthria, agnosia, amnesia, paralysis, loss of coordination, or loss of consciousness  Medication Review  Abatacept, INSULIN SYRINGE .5CC/29G, brimonidine, diltiazem, dorzolamidel-timolol, gentamicin cream, glucose blood, heparin, insulin aspart, insulin glargine, ketoconazole, levocetirizine, midodrine, omeprazole, oxyCODONE, promethazine, and torsemide  History Review  Allergy: Ms. Schwarting is allergic to cinnamon, garlic, onion, tylenol [acetaminophen], ciprofloxacin, ginger, and prednisone. Drug: Ms. Dominy  reports no history of drug use. Alcohol:  reports no history of alcohol use. Tobacco:  reports that she has been smoking cigarettes. She has a 5.00 pack-year smoking history. She has never used smokeless tobacco. Social: Ms. Blumenstock  reports that she has been smoking cigarettes. She has a 5.00 pack-year smoking history. She has never used smokeless tobacco. She reports that she does not drink alcohol and does not use drugs. Medical:  has a past medical history of Allergy, Anemia, Anxiety, Arthritis, Ataxia, COPD (chronic obstructive pulmonary disease) ([place]), Depression, Diabetes mellitus with complication ([place]), Dialysis patient ([place]), ESRD on hemodialysis ([place]), Fistula, Hepatitis (2003), Hiatal hernia, Hypercholesterolemia, Hypertension, Migraine, Neuropathy, diabetic ([place]), Non-alcoholic cirrhosis ([place]), Peripheral vascular disease ([place]), Pneumonia (2015), Psoriasis, Renal insufficiency, Tobacco dependence, and Wears dentures. Surgical: Ms. Tigue  has a past surgical history that includes Tonsillectomy; rt. tubal and ovary removed; Cholecystectomy; Cardiac catheterization (N/A, 07/25/2015); Cardiac catheterization (Left, 07/25/2015); Cardiac catheterization (Left,  10/07/2015); Cardiac catheterization (N/A, 10/07/2015); Cardiac catheterization (10/07/2015); Cardiac catheterization (N/A, 12/17/2015); Incision and drainage abscess (N/A, 07/16/2016); cyst removed  from left hand (Left, 1989); AV fistula placement (Left, 11/2014); Cardiac catheterization (N/A, 01/11/2017); Cardiac catheterization (N/A, 01/11/2017); DIALYSIS/PERMA CATHETER INSERTION (N/A, 05/20/2017); Cesarean section; Tubal ligation; Colonoscopy (N/A, 09/22/2017); Esophagogastroduodenoscopy (N/A, 09/22/2017); polypectomy (09/22/2017); A/V SHUNT INTERVENTION (N/A, 12/16/2017); STENT PLACEMENT VASCULAR ([facility] HX) (Left, 03/2018); Upper Extremity Angiography (Left, 09/29/2019); Aqueous shunt (Left, 12/20/2019); Colonoscopy with propofol (N/A, 04/16/2020); and A/V Fistulagram (Left, 04/23/2020). Family: family history includes Heart disease in her mother; Hypertension in her father and mother; Skin cancer in her father.  Laboratory Chemistry Profile   Renal Lab Results  Component Value Date   BUN 52 (H) 05/01/2019   CREATININE 8.2 (A) 07/17/2020   BCR 10 12/27/2017   GFRAA 6 (L) 05/01/2019   GFRNONAA 6 (L) 05/01/2019     Hepatic Lab Results  Component Value Date   AST 15 07/17/2020   ALT 12 03/26/2020   ALBUMIN 4.0 07/17/2020   ALKPHOS 74 07/17/2020   HCVAB >11.0 (H) 12/16/2016   LIPASE 21 04/30/2019     Electrolytes Lab Results  Component Value Date   NA 137 07/17/2020   K 5.5 (A) 07/17/2020   CL 101 07/17/2020   CALCIUM 7.6 (A) 07/17/2020   MG 2.4 12/14/2017   PHOS 4.2 05/01/2019     Bone No results found for: VD25OH, VD125OH2TOT, HY8657QI6, NG2952WU1, 25OHVITD1, 25OHVITD2, 25OHVITD3, TESTOFREE, TESTOSTERONE   Inflammation (CRP: Acute Phase) (ESR: Chronic Phase) Lab Results  Component Value Date   CRP <0.8 12/16/2016   ESRSEDRATE 117 (H) 10/12/2017   LATICACIDVEN 1.6 04/30/2019       Note: Above Lab results reviewed.  Recent Imaging Review  DG Lumbar Spine Complete  W/Bend CLINICAL DATA:  Chronic low back pain without known injury.  EXAM: LUMBAR SPINE - COMPLETE WITH BENDING VIEWS  COMPARISON:  None.  FINDINGS: No fracture or spondylolisthesis is noted. No change in vertebral body alignment  is noted on flexion or extension views. Mild degenerative disc disease is noted at L1-2, L2-3 and L3-4.  IMPRESSION: Mild multilevel degenerative disc disease. No acute abnormality seen in the lumbar spine.  Electronically Signed   By: Marijo Conception M.D.   On: 02/05/2021 08:27 Note: Reviewed        Physical Exam  General appearance: Well nourished, well developed, and well hydrated. In no apparent acute distress Mental status: Alert, oriented x 3 (person, place, & time)       Respiratory: No evidence of acute respiratory distress Eyes: PERLA Vitals: BP (!) 172/81 (BP Location: Right Arm, Patient Position: Sitting, Cuff Size: Normal)   Pulse 78   Temp (!) 96.7 F (35.9 C)   Resp 18   Ht _0  (1.702 m)   Wt 227 lb (103 kg)   SpO2 99%   BMI 35.55 kg/m  BMI: Estimated body mass index is 35.55 kg/m as calculated from the following:   Height as of this encounter: _1  (1.702 m).   Weight as of this encounter: 227 lb (103 kg). Ideal: Ideal body weight: 61.6 kg (135 lb 12.9 oz) Adjusted ideal body weight: 78.1 kg (172 lb 4.5 oz)    Lumbar Spine Area Exam  Skin & Axial Inspection: No masses, redness, or swelling Alignment: Symmetrical Functional ROM: Pain restricted ROM affecting both sides Stability: No instability detected Muscle Tone/Strength: Functionally intact. No obvious neuro-muscular anomalies detected. Sensory (Neurological): Musculoskeletal pain pattern   Provocative Tests: Hyperextension/rotation test: (+) bilaterally for facet joint pain. Lumbar quadrant test (Kemp's test): (+) bilaterally for facet joint pain.   Gait & Posture Assessment  Ambulation: Unassisted Gait: Antalgic gait (limping) Posture: Difficulty standing up  straight, due to pain   Neuropathic pain of bilateral feet.  Paresthesias present.   Assessment   Status Diagnosis  Persistent Persistent Persistent 1. Diabetic polyneuropathy associated with type 2 diabetes mellitus (HCC)   2. Cervical radiculopathy   3. Lumbar spondylosis   4. Lumbar radiculopathy   5. Trigger finger, left ring finger   6. DDD (degenerative disc disease), cervical   7. Chronic pain syndrome         Plan of Care  [name] has a current medication list which includes the following long-term medication(s): orencia, diltiazem, insulin glargine, levocetirizine, novolog, [START ON 07/16/2021] oxycodone, [START ON 08/15/2021] oxycodone, and [START ON 09/14/2021] oxycodone.  Pharmacotherapy (Medications Ordered): Meds ordered this encounter  Medications   oxyCODONE (OXY IR/ROXICODONE) 5 MG immediate release tablet    Sig: Take 1 tablet (5 mg total) by mouth every 8 (eight) hours as needed for severe pain. Must last 30 days.    Dispense:  75 tablet    Refill:  0    Chronic Pain. (STOP Act - Not applicable). Fill one day early if closed on scheduled refill date.   oxyCODONE (OXY IR/ROXICODONE) 5 MG immediate release tablet    Sig: Take 1 tablet (5 mg total) by mouth every 8 (eight) hours as needed for severe pain. Must last 30 days.    Dispense:  75 tablet    Refill:  0    Chronic Pain. (STOP Act - Not applicable). Fill one day early if closed on scheduled refill date.   oxyCODONE (OXY IR/ROXICODONE) 5 MG immediate release tablet    Sig: Take 1 tablet (5 mg total) by mouth every 8 (eight) hours as needed for severe pain. Must last 30 days.    Dispense:  75  tablet    Refill:  0    Chronic Pain. (STOP Act - Not applicable). Fill one day early if closed on scheduled refill date.   Patient has a history of diabetic peripheral neuropathy.  We discussed Qutenza capsaicin topical therapy for her condition.  The patient does have multiple comorbidities.  We  discussed the benefits of topical Qutenza which includes not systemic absorption minimizing drug drug interactions, sustained pain relief and nonopioid.  Risk and benefits reviewed and patient would like to proceed.   Follow-up plan:   Return in about 3 months (around 10/08/2021) for Medication Management, in person.   Recent Visits No visits were found meeting these conditions. Showing recent visits within past 90 days and meeting all other requirements Today's Visits Date Type Provider Dept  07/08/21 Office Visit Gillis Santa, MD [facility]  Showing today's visits and meeting all other requirements Future Appointments No visits were found meeting these conditions. Showing future appointments within next 90 days and meeting all other requirements I discussed the assessment and treatment plan with the patient. The patient was provided an opportunity to ask questions and all were answered. The patient agreed with the plan and demonstrated an understanding of the instructions.  Patient advised to call back or seek an in-person evaluation if the symptoms or condition worsens.  Duration of encounter:30 minutes.  Note by: Gillis Santa, MD Date: 07/08/2021; Time: 9:03 AM

## 2021-07-08 NOTE — Chronic Care Management (AMB) (Signed)
  Chronic Care Management   Outreach Note  07/08/2021 Name: [name] MRN: [id number] DOB: [birth year]  [name] is a 55 y.o. year old female who is a primary care patient of Olin Hauser, DO. I reached out to [name] by phone today in response to a referral sent by [name] PCP, Olivia Ranger [name], DO      An unsuccessful telephone outreach was attempted today. The patient was referred to the case management team for assistance with care management and care coordination.   Follow Up Plan: A HIPAA compliant phone message was left for the patient providing contact information and requesting a return call.  The care management team will reach out to the patient again over the next 7 days.  If patient returns call to provider office, please advise to call [place] at [place], [place], [place], [place] [address] Direct Dial: [phone] [name] Website: [email or web address]

## 2021-07-08 NOTE — Progress Notes (Signed)
Nursing Pain Medication Assessment:  Safety precautions to be maintained throughout the outpatient stay will include: orient to surroundings, keep bed in low position, maintain call bell within reach at all times, provide assistance with transfer out of bed and ambulation.  Medication Inspection Compliance: Pill count conducted under aseptic conditions, in front of the patient. Neither the pills nor the bottle was removed from the patient's sight at any time. Once count was completed pills were immediately returned to the patient in their original bottle.  Medication: Oxycodone IR Pill/Patch Count:  23 of 75 pills remain Pill/Patch Appearance: Markings consistent with prescribed medication Bottle Appearance: Standard pharmacy container. Clearly labeled. Filled Date: 06 / 27 / 2022 Last Medication intake:  Today

## 2021-07-08 NOTE — Patient Instructions (Signed)

## 2021-07-14 ENCOUNTER — Telehealth: Payer: Self-pay

## 2021-07-14 ENCOUNTER — Telehealth: Payer: Self-pay | Admitting: Family Medicine

## 2021-07-14 DIAGNOSIS — N186 End stage renal disease: Secondary | ICD-10-CM

## 2021-07-14 DIAGNOSIS — B372 Candidiasis of skin and nail: Secondary | ICD-10-CM

## 2021-07-14 DIAGNOSIS — J3089 Other allergic rhinitis: Secondary | ICD-10-CM

## 2021-07-14 DIAGNOSIS — E1122 Type 2 diabetes mellitus with diabetic chronic kidney disease: Secondary | ICD-10-CM

## 2021-07-14 NOTE — Chronic Care Management (AMB) (Signed)
  Chronic Care Management   Outreach Note  07/14/2021 Name: [name] MRN: [id number] DOB: May 01, 1966  [name] is a 55 y.o. year old female who is a primary care patient of Olin Hauser, DO. I reached out to [name] by phone today in response to a referral sent by [name]'s PCP, [name], DO      A second unsuccessful telephone outreach was attempted today. The patient was referred to the case management team for assistance with care management and care coordination.   Follow Up Plan: A HIPAA compliant phone message was left for the patient providing contact information and requesting a return call.  The care management team will reach out to the patient again over the next 7 days.  If patient returns call to provider office, please advise to call [place] at [place], [place], [place], [place] [address] Direct Dial: [phone] [name]: [email or web address]

## 2021-07-14 NOTE — Telephone Encounter (Signed)
Notes to clinic:  This prescription was filled on 07/14/2021. Any refills authorized will be placed on file   Requested Prescriptions  Pending Prescriptions Disp Refills   NOVOLOG 100 UNIT/ML injection [Pharmacy Med Name: Novolog U-100 Insulin aspart 100 unit/mL subcutaneous solution] 10 mL 0    Sig: INJECT 2 UNITS SUBCUTANEOUSLY 3 TIMES A DAY BEFORE MEALS      Endocrinology:  Diabetes - Insulins Failed - 07/14/2021 10:51 AM      Failed - HBA1C is between 0 and 7.9 and within 180 days    Hemoglobin A1C  Date Value Ref Range Status  01/13/2021 7.7  Final          Passed - Valid encounter within last 6 months    Recent Outpatient Visits           4 months ago Type 2 diabetes mellitus with ESRD (end-stage renal disease) ([place])   [facility], [name], DO   11 months ago Type 2 diabetes mellitus with ESRD (end-stage renal disease) ([place])   [facility] Olin Hauser, DO   1 year ago Type 2 diabetes mellitus with ESRD (end-stage renal disease) ([place])   [facility] Olin Hauser, DO   2 years ago Type 2 diabetes mellitus with ESRD (end-stage renal disease) ([place])   [place], [name], DO   2 years ago General medical exam   [place], DO       Future Appointments             In 1 month [facility], [name], DO [facility]               ketoconazole (NIZORAL) 2 % cream [Pharmacy Med Name: ketoconazole 2 % topical cream] 30 g 5    Sig: APPLY TOPICALLY ONTO THE SKIN (BOTH FEET) TWICE A DAY AS NEEDED      Over the Counter:  OTC Passed - 07/14/2021 10:51 AM      Passed - Valid encounter within last 12 months    Recent Outpatient Visits           4 months ago Type 2 diabetes mellitus with ESRD (end-stage renal disease) ([place])   [facility], [name], DO   11  months ago Type 2 diabetes mellitus with ESRD (end-stage renal disease) ([place])   [facility] [place], [name], DO   1 year ago Type 2 diabetes mellitus with ESRD (end-stage renal disease) ([place])   [facility], [name], DO   2 years ago Type 2 diabetes mellitus with ESRD (end-stage renal disease) ([place])   [facility] [place], [name], DO   2 years ago General medical exam   [place], DO       Future Appointments             In 1 month [facility], [facility]               ipratropium (ATROVENT) 0.06 % nasal spray [Pharmacy Med Name: ipratropium bromide 42 mcg (0.06 %) nasal spray] 15 mL 5    Sig: INHALE 2 SPRAYS IN EACH NOSTRIL 4 TIMES A DAY      Off-Protocol Failed - 07/14/2021 10:51 AM      Failed - Medication not assigned to  a protocol, review manually.      Passed - Valid encounter within last 12 months    Recent Outpatient Visits           4 months ago Type 2 diabetes mellitus with ESRD (end-stage renal disease) ([place])   [facility], [name], DO   11 months ago Type 2 diabetes mellitus with ESRD (end-stage renal disease) ([place])   [facility] Olin Hauser, DO   1 year ago Type 2 diabetes mellitus with ESRD (end-stage renal disease) ([place])   [facility] Olin Hauser, DO   2 years ago Type 2 diabetes mellitus with ESRD (end-stage renal disease) ([place])   [place], DO   2 years ago General medical exam   [place], DO       Future Appointments             In 1 month [facility], [name], [facility]            Off-Protocol Failed - 07/14/2021 10:51 AM      Failed - Medication not assigned to a protocol, review  manually.      Passed - Valid encounter within last 12 months    Recent Outpatient Visits           4 months ago Type 2 diabetes mellitus with ESRD (end-stage renal disease) ([place])   [facility], [name], DO   11 months ago Type 2 diabetes mellitus with ESRD (end-stage renal disease) ([place])   [facility] Olin Hauser, DO   1 year ago Type 2 diabetes mellitus with ESRD (end-stage renal disease) ([place])   [facility] Olin Hauser, DO   2 years ago Type 2 diabetes mellitus with ESRD (end-stage renal disease) ([place])   [facility] Olin Hauser, DO   2 years ago General medical exam   [place], DO       Future Appointments             In 1 month [facility], [name], DO [facility], [facility]

## 2021-07-14 NOTE — Telephone Encounter (Signed)
   Telephone encounter was:  Unsuccessful.  07/14/2021 Name: [name] MRN: [id number] DOB: 1966-06-19  Unsuccessful outbound call made today to assist with:   transportation and medication assistance.  Outreach Attempt:  1st Attempt  A HIPAA compliant voice message was left requesting a return call.  Instructed patient to call back at [phone].  Jude Naclerio, AAS Paralegal, [facility]  [address], [place] [address] [name]  ?? [phone]   [email or web address]

## 2021-07-16 ENCOUNTER — Telehealth: Payer: Self-pay | Admitting: Family Medicine

## 2021-07-16 DIAGNOSIS — N186 End stage renal disease: Secondary | ICD-10-CM

## 2021-07-16 DIAGNOSIS — E1122 Type 2 diabetes mellitus with diabetic chronic kidney disease: Secondary | ICD-10-CM

## 2021-07-16 MED ORDER — HUMALOG KWIKPEN 100 UNIT/ML ~~LOC~~ SOPN: PEN_INJECTOR | SUBCUTANEOUS | 3 refills | Status: DC

## 2021-07-16 NOTE — Telephone Encounter (Signed)
Pt called requesting for PCP to authorize her Novolog refill with insurance company, please advise

## 2021-07-16 NOTE — Telephone Encounter (Signed)
Switched to Humalog.  Nobie Putnam, [facility] 07/16/2021, 1:55 PM

## 2021-07-16 NOTE — Chronic Care Management (AMB) (Signed)
  Chronic Care Management   Note  07/16/2021 Name: [name] MRN: [id number] DOB: 08-01-1966  [name] is a 55 y.o. year old female who is a primary care patient of Olin Hauser, DO. I reached out to [name] by phone today in response to a referral sent by Ms. [name] Mikkelsen's PCP, [name], DO      Ms. Blanchett was given information about Chronic Care Management services today including:  CCM service includes personalized support from designated clinical staff supervised by her physician, including individualized plan of care and coordination with other care providers 24/7 contact phone numbers for assistance for urgent and routine care needs. Service will only be billed when office clinical staff spend 20 minutes or more in a month to coordinate care. Only one practitioner may furnish and bill the service in a calendar month. The patient may stop CCM services at any time (effective at the end of the month) by phone call to the office staff. The patient will be responsible for cost sharing (co-pay) of up to 20% of the service fee (after annual deductible is met).  Patient did not agree to enrollment in care management services and does not wish to consider at this time.  Follow up plan: Patient declines  engagement by the care management team. Appropriate care team members and provider have been notified via electronic communication.   Noreene Larsson, [place], [place], [place] [address] Direct Dial: [phone] [email or web address]: [email or web address]

## 2021-07-16 NOTE — Telephone Encounter (Signed)
[name] is calling and the patient insurance will not longer covered novolog please switch to another medication or PA please call [phone]

## 2021-07-17 ENCOUNTER — Telehealth: Payer: Self-pay | Admitting: Family Medicine

## 2021-07-17 DIAGNOSIS — E1122 Type 2 diabetes mellitus with diabetic chronic kidney disease: Secondary | ICD-10-CM

## 2021-07-17 DIAGNOSIS — N186 End stage renal disease: Secondary | ICD-10-CM

## 2021-07-17 MED ORDER — INSULIN PEN NEEDLE 31G X 8 MM MISC: 3 refills | Status: DC

## 2021-07-17 NOTE — Telephone Encounter (Signed)
Pharmacy calling to ask for the pen needles that go with the Humalog kwik pen prescribed yesterday. They have 31 g / 8 mm in stock  Please send prior to 3 pm as that is when delivery is.  [place], [place]

## 2021-07-18 ENCOUNTER — Encounter: Payer: Self-pay | Admitting: Family Medicine

## 2021-07-18 ENCOUNTER — Telehealth: Payer: Self-pay

## 2021-07-18 NOTE — Telephone Encounter (Signed)
Alright.  Response back from [facility] CPP regarding Novolog vs Humalog for this patient.  There is no medical reason to be required to use Novolog for Kidney/Liver issues.  My understanding is that the Humalog/Kwikpen is preferred and no PA would be required. I would like to proceed with this as it was already ordered.  I spoke with [place].  They looked into issue. Patient was the one who requested to switch from Humalog back to Novolog due to preference, on for 30+ years.   There is no medical reason that she cannot take Humalog.  She already picked up the [place] and it has $0 copay.  I attempted to call patient, did not reach her, left detailed voicemail. Will send mychart message.  Basically, if she agrees to try the [place] she already has - that is my preference, as it is covered and preferred and should be equivalent.  If she absolutely does not want to take that Humalog, then she needs to confirm with me one more time, and I will place new order once again for Novolog and we will need to submit a PA request. If it does not cover or approve the request - then unfortunately we are out of options and she would need to take the Humalog and find a new insurance plan in future etc.  Nobie Putnam, [facility] 07/18/2021, 11:34 AM

## 2021-07-18 NOTE — Telephone Encounter (Signed)
Pt has stage 2 liver and kidney disease and is on dialysis / Pt wants to take Novolog and she was advised by insurance not to take Humalog/ Pt states they advised her that if Dr. Raliegh Ip puts that she has liver and kidney issues that they should approve Novolog / please advise asap

## 2021-07-18 NOTE — Telephone Encounter (Signed)
Copied from [place] [id number]. Topic: General - Other >> Jul 17, 2021  3:46 PM Leward Quan A wrote: Reason for CRM: Latoya with [facility] called in to inform Dr Raliegh Ip that patient have liver and Kidney disease and can not use the HUMALOG KWIKPEN 100 UNIT/ML but asking if Dr can please do a prior authorization for NOVOLOG 100 UNIT/ML injection because with the proper paper work then insurance may pay for the medication.      ** I can do a PA but the medication has to be prescribed first.

## 2021-07-18 NOTE — Telephone Encounter (Signed)
I am aware. This is already referenced earlier in this phone message.  It is also inaccurate medical information unfortunately. Kidney / Liver issue should not impact her ability to use Humalog insulin.  However, if she prefers to switch back to Novolog once she talks to [place] again, then we can include her medical history on the PA request.  [name], [facility] 07/18/2021, 12:01 PM

## 2021-07-20 LAB — HEMOGLOBIN A1C: Hemoglobin A1C: 8.2

## 2021-07-25 ENCOUNTER — Other Ambulatory Visit: Payer: Self-pay | Admitting: Podiatry

## 2021-08-15 ENCOUNTER — Other Ambulatory Visit: Payer: Self-pay | Admitting: Family Medicine

## 2021-08-15 DIAGNOSIS — I10 Essential (primary) hypertension: Secondary | ICD-10-CM

## 2021-08-15 DIAGNOSIS — N186 End stage renal disease: Secondary | ICD-10-CM

## 2021-08-15 NOTE — Telephone Encounter (Signed)
Requested medication (s) are due for refill today: Yes  Requested medication (s) are on the active medication list: Yes  Last refill:  06/11/21  Future visit scheduled: Yes  Notes to clinic:  Unable to refill per protocol, not assigned to protocol.     Requested Prescriptions  Pending Prescriptions Disp Refills   TRUEPLUS INSULIN SYRINGE 29G X 1/2" 0.5 ML MISC [Pharmacy Med Name: TRUEplus Insulin 0.5 mL 29 gauge x 1/2" syringe] 100 each 1    Sig: USE 4 TIMES A DAY WITH LANTUS & NOVOLOG     There is no refill protocol information for this order    Signed Prescriptions Disp Refills   insulin glargine (LANTUS) 100 UNIT/ML injection 10 mL 2    Sig: INJECT 10 UNITS SUBCUTANEOUSLY ONCE A DAY     Endocrinology:  Diabetes - Insulins Failed - 08/15/2021 10:27 PM      Failed - HBA1C is between 0 and 7.9 and within 180 days    Hemoglobin A1C  Date Value Ref Range Status  01/13/2021 7.7  Final          Passed - Valid encounter within last 6 months    Recent Outpatient Visits           5 months ago Type 2 diabetes mellitus with ESRD (end-stage renal disease) ([place])   [facility] Olin Hauser, DO   1 year ago Type 2 diabetes mellitus with ESRD (end-stage renal disease) ([place])   [facility], [name], DO   1 year ago Type 2 diabetes mellitus with ESRD (end-stage renal disease) ([place])   [facility] Olin Hauser, DO   2 years ago Type 2 diabetes mellitus with ESRD (end-stage renal disease) ([place])   [facility] Olin Hauser, DO   2 years ago General medical exam   [place], DO       Future Appointments             In 6 days [place], [name], DO [facility]             diltiazem (CARDIZEM) 30 MG tablet 270 tablet 1    Sig: TAKE ONE TABLET BY MOUTH THREE TIMES DAILY MAY REDUCE TO 1/2 TAB AS  PREFERRED DAY BEFORE HEMODIALYSIS. SKIP DOSE MORNING OF HD     Cardiovascular:  Calcium Channel Blockers Failed - 08/15/2021 10:27 PM      Failed - Last BP in normal range    BP Readings from Last 1 Encounters:  07/08/21 (!) 172/81          Passed - Valid encounter within last 6 months    Recent Outpatient Visits           5 months ago Type 2 diabetes mellitus with ESRD (end-stage renal disease) ([place])   [facility], [name], DO   1 year ago Type 2 diabetes mellitus with ESRD (end-stage renal disease) ([place])   [facility] Olin Hauser, DO   1 year ago Type 2 diabetes mellitus with ESRD (end-stage renal disease) ([place])   [facility] Olin Hauser, DO   2 years ago Type 2 diabetes mellitus with ESRD (end-stage renal disease) ([place])   [facility] [place], [name], DO   2 years ago General medical exam   [facility] Olin Hauser, DO  Future Appointments             In 6 days [place], [name], DO [facility]

## 2021-08-21 ENCOUNTER — Ambulatory Visit (INDEPENDENT_AMBULATORY_CARE_PROVIDER_SITE_OTHER): Payer: 59 | Admitting: Family Medicine

## 2021-08-21 ENCOUNTER — Other Ambulatory Visit: Payer: Self-pay

## 2021-08-21 ENCOUNTER — Encounter: Payer: Self-pay | Admitting: Family Medicine

## 2021-08-21 VITALS — BP 180/70 | HR 82 | Ht 66.0 in | Wt 234.8 lb

## 2021-08-21 DIAGNOSIS — N186 End stage renal disease: Secondary | ICD-10-CM

## 2021-08-21 DIAGNOSIS — L84 Corns and callosities: Secondary | ICD-10-CM | POA: Diagnosis not present

## 2021-08-21 DIAGNOSIS — E11621 Type 2 diabetes mellitus with foot ulcer: Secondary | ICD-10-CM | POA: Diagnosis not present

## 2021-08-21 DIAGNOSIS — E1140 Type 2 diabetes mellitus with diabetic neuropathy, unspecified: Secondary | ICD-10-CM

## 2021-08-21 DIAGNOSIS — L97521 Non-pressure chronic ulcer of other part of left foot limited to breakdown of skin: Secondary | ICD-10-CM

## 2021-08-21 DIAGNOSIS — E1122 Type 2 diabetes mellitus with diabetic chronic kidney disease: Secondary | ICD-10-CM | POA: Diagnosis not present

## 2021-08-21 MED ORDER — NOVOLOG 100 UNIT/ML IJ SOLN: 3.0000 [IU] | Freq: Three times a day (TID) | INTRAMUSCULAR | 11 refills | Status: DC

## 2021-08-21 NOTE — Assessment & Plan Note (Addendum)
Recent elevated F2X to 8.2 Complications - ESRD on HD, peripheral neuropathy, DM retinopathy, hyperlipidemia, GERD, depression, obesity, increases risk of future cardiovascular complications   Plan:  1. Continue current therapy - insulin, Lantus 10u daily, Novolog meal time wc 3-6u TID adjust accordingly - However will switch back to Novolog vial now instead of Humalog kwik pen based on insurance. She request this change due to difficulty using Kwik pen and has been controlled better on Humalog vials in past. 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 2. Encourage improved lifestyle - low carb, low sugar diet, reduce portion size, continue improving regular exercise 3. Check CBG, bring log to next visit for review 4. OFF ARB  DM Foot exam today for new DM Shoe order

## 2021-08-21 NOTE — Progress Notes (Signed)
Subjective:    Patient ID: [name], female    DOB: 1966/11/18, 55 y.o.   MRN: [id number]  [name] is a 55 y.o. female presenting on 08/21/2021 for Diabetes and Hypertension   HPI  CHRONIC DM, Type 2 / ESRD on HD Diabetic Neuropathy, bilateral feet Toenails Overgrown Left Foot Diabetic Ulcer [facility] Followed by Vascular specialist  Difficulty handling the pen and admin the Novolog, difficulty with admin pen, has failed to get medicine in past with attempting to use. Previously controlled on Novolog vial injection.  She prefers Novolog from vial with syringe for confirmation   Last A1c up to 8 range Needs new DM shoe order. She is on chronic insulin therapy has ESRD on HD M-W-F Meds: Lantus 10 units daily, Novolog mealtime 3-6 unit with meals Reports good compliance. Tolerating well w/o side-effects does admit to some wt gain on insulin Lifestyle: - Diet (improving diabetic diet)  She has upcoming apt with [place] within 1-2 months, retinopathy, cataracts, L eye blind history of glaucoma as well. Admits some nausea with spasm in espohagus / gastroparesis - says if takes insulin and phosphate binders may not eat much, so she eats meal first. Then takes med Denies hypoglycemia, polyuria, visual changes, numbness or tingling.   CHRONIC HTN:  Reports fluctuating BP, often if go to dialysis will have BP 140/70, then if on HD she will have lower BP down to 70/40, she will take Midodrine to help raise her BP. Then will have symptoms of headache and hypertension. Current Meds - Diltiazem 30mg  - (ordered 3 times day) usually taking 1-2 times a day. Often taking  Reports good compliance, took meds today. Tolerating well, w/o complaints. Denies CP, dyspnea, HA, edema, dizziness / lightheadedness  Quit smoking last week, now quit 1 week ago Issue with BP at HD low then given midodrine.   Depression screen [facility] 2/9 08/21/2021 07/08/2021 04/01/2021  Decreased Interest 2 0  0  Down, Depressed, Hopeless 2 0 0  PHQ - 2 Score 4 0 0  Altered sleeping 2 - -  Tired, decreased energy 2 - -  Change in appetite 2 - -  Feeling bad or failure about yourself  2 - -  Trouble concentrating 0 - -  Moving slowly or fidgety/restless 3 - -  Suicidal thoughts 0 - -  PHQ-9 Score 15 - -  Difficult doing work/chores Very difficult - -  Some recent data might be hidden    Social History   Tobacco Use   Smoking status: Light Smoker    Packs/day: 0.25    Years: 20.00    Pack years: 5.00    Types: Cigarettes    Last attempt to quit: 05/21/2017    Years since quitting: 4.2   Smokeless tobacco: Never  Vaping Use   Vaping Use: Never used  Substance Use Topics   Alcohol use: No   Drug use: No    Review of Systems Per HPI unless specifically indicated above     Objective:    BP (!) 180/70   Pulse 82   Ht 5\' 6"  (1.676 m)   Wt 234 lb 12.8 oz (106.5 kg)   SpO2 99%   BMI 37.90 kg/m   Wt Readings from Last 3 Encounters:  08/21/21 234 lb 12.8 oz (106.5 kg)  07/08/21 227 lb (103 kg)  04/01/21 228 lb (103.4 kg)    Physical Exam Vitals and nursing note reviewed.  Constitutional:  General: She is not in acute distress.    Appearance: She is well-developed. She is obese. She is not diaphoretic.     Comments: Well-appearing, comfortable, cooperative  HENT:     Head: Normocephalic and atraumatic.  Eyes:     General:        Right eye: No discharge.        Left eye: No discharge.     Conjunctiva/sclera: Conjunctivae normal.  Neck:     Thyroid: No thyromegaly.  Cardiovascular:     Rate and Rhythm: Normal rate and regular rhythm.     Heart sounds: Normal heart sounds. No murmur heard. Pulmonary:     Effort: Pulmonary effort is normal. No respiratory distress.     Breath sounds: Normal breath sounds. No wheezing or rales.  Musculoskeletal:        General: Normal range of motion.     Cervical back: Normal range of motion and neck supple.  Lymphadenopathy:      Cervical: No cervical adenopathy.  Skin:    General: Skin is warm and dry.     Findings: No erythema or rash.  Neurological:     Mental Status: She is alert and oriented to person, place, and time.  Psychiatric:        Behavior: Behavior normal.     Comments: Well groomed, good eye contact, normal speech and thoughts    Diabetic Foot Exam - Simple   Simple Foot Form Diabetic Foot exam was performed with the following findings: Yes 08/21/2021  9:58 AM  Visual Inspection See comments: Yes Sensation Testing See comments: Yes Pulse Check Posterior Tibialis and Dorsalis pulse intact bilaterally: Yes Comments Left forefoot with large area pre ulcerative callus formation chronic problem large area without open ulceration today. Some dry skin and other callus formation. Neuropathy with reduced monofilament sensation bilateral.       Results for orders placed or performed in visit on 08/21/21  Hemoglobin A1c  Result Value Ref Range   Hemoglobin A1C 8.2       Assessment & Plan:   Problem List Items Addressed This Visit     Type 2 diabetes mellitus with ESRD (end-stage renal disease) ([place]) - Primary   Relevant Medications   NOVOLOG 100 UNIT/ML injection   Other Relevant Orders   POCT HgB A1C   Diabetic foot ulcer ([place])   Relevant Medications   NOVOLOG 100 UNIT/ML injection   Other Visit Diagnoses     Callus of foot       Neuropathy due to type 2 diabetes mellitus ([place])       Relevant Medications   NOVOLOG 100 UNIT/ML injection       Meds ordered this encounter  Medications   NOVOLOG 100 UNIT/ML injection    Sig: Inject 3-6 Units into the skin 3 (three) times daily before meals.    Dispense:  10 mL    Refill:  11    Requesting PA / Appeal since previously controlled on Novolog injection and has failed Humalog Pen, difficulty with admin.     Chronic bilateral diabetic neuropathy in both feet, as complication of diabetes Additionally with Left foot chronic callus  with history of ulceration.  Completed DM Foot Exam today in office. See exam note.  Plan - Proceed with ordering Diabetic Shoes, completed handwritten rx pad today, given to patient today to bring to Podiatry [facility] for fitting /order form  - Patient would benefit from Diabetic Shoes due to neuropathy with callus formation  deformity causing pain and ulceration risk, diabetes control is improving on current regimen, and I am continuing to monitor and manage diabetes.  Will proceed with authorizing order for diabetic shoes when receive completed order form from podiatry  Patient will benefit from switch to Novolog insulin vial due to following reasons for medical necessity - she cannot handle the [facility] Pen, due to eye sight and difficulty admin the device. She is having difficulty administering the medication with Kwik Pen. She was better controlled on Novolog Vials.  Follow up plan: Return in about 3 months (around 11/20/2021) for 3 month DM A1c, HTN.   Nobie Putnam, [facility] 08/21/2021, 9:43 AM

## 2021-08-21 NOTE — Patient Instructions (Addendum)
Thank you for coming to the office today.  Rx for Diabetic Shoes today, turn in to Podiatry, and we can sign off on an order form when they are ready.  For the insulin, placed new order for Novolog vial injections today, will likely need Prior Auth and Appeal, stay tuned  Message on MyChart for Test strips and updates.  Please schedule a Follow-up Appointment to: Return in about 3 months (around 11/20/2021) for 3 month DM A1c, HTN.  If you have any other questions or concerns, please feel free to call the office or send a message through [place]. You may also schedule an earlier appointment if necessary.  Additionally, you may be receiving a survey about your experience at our office within a few days to 1 week by e-mail or mail. We value your feedback.  Nobie Putnam, DO [place]

## 2021-09-11 ENCOUNTER — Other Ambulatory Visit: Payer: Self-pay

## 2021-09-11 ENCOUNTER — Ambulatory Visit (INDEPENDENT_AMBULATORY_CARE_PROVIDER_SITE_OTHER): Payer: 59 | Admitting: Podiatry

## 2021-09-11 ENCOUNTER — Encounter: Payer: Self-pay | Admitting: Podiatry

## 2021-09-11 ENCOUNTER — Other Ambulatory Visit: Payer: Self-pay | Admitting: Podiatry

## 2021-09-11 DIAGNOSIS — M2042 Other hammer toe(s) (acquired), left foot: Secondary | ICD-10-CM | POA: Diagnosis not present

## 2021-09-11 DIAGNOSIS — M79675 Pain in left toe(s): Secondary | ICD-10-CM

## 2021-09-11 DIAGNOSIS — B351 Tinea unguium: Secondary | ICD-10-CM

## 2021-09-11 DIAGNOSIS — E0843 Diabetes mellitus due to underlying condition with diabetic autonomic (poly)neuropathy: Secondary | ICD-10-CM | POA: Diagnosis not present

## 2021-09-11 DIAGNOSIS — M79674 Pain in right toe(s): Secondary | ICD-10-CM

## 2021-09-11 DIAGNOSIS — L84 Corns and callosities: Secondary | ICD-10-CM

## 2021-09-11 DIAGNOSIS — M2041 Other hammer toe(s) (acquired), right foot: Secondary | ICD-10-CM

## 2021-09-11 NOTE — Progress Notes (Signed)
  Subjective:  Patient ID: [name], female    DOB: 01/28/1966,  MRN: [id number]  Chief Complaint  Patient presents with   Nail Problem    "I need my toenails clipped."   Callouses    "I need my callus trimmed."   Diabetes    "I want to get some shoes.  I have a prescription from my primary care doctor."   55 y.o. female returns for the above complaint.  Patient presents with thickened elongated dystrophic toenails x10.  Patient states it painful to walk on.  She would like to have the repair done.  She is diabetic with A1c of 7.9.  She also states that she has is hyperkeratotic lesion/preulcerative lesion to left submetatarsal 5.  Pain with ambulation.  She would like to have deliberate make sure that there is no ulceration underneath it.  She would also like to be scheduled for diabetic shoes.  Objective:  There were no vitals filed for this visit. Podiatric Exam: Vascular: dorsalis pedis and posterior tibial pulses are palpable bilateral. Capillary return is immediate. Temperature gradient is WNL. Skin turgor WNL  Sensorium: Diminished emmes Weinstein monofilament test.  Diminished actile sensation bilaterally. Nail Exam: Pt has thick disfigured discolored nails with subungual debris noted bilateral entire nail hallux through fifth toenails.  Pain on palpation to the nails. Ulcer Exam: There is no evidence of ulcer or pre-ulcerative changes or infection. Orthopedic Exam: Muscle tone and strength are WNL. No limitations in general ROM. No crepitus or effusions noted. HAV  B/L.  Hammer toes 2-5  B/L. Skin: No Porokeratosis. No infection or ulcers.  Preulcerative lesion/callus formation noted to left submetatarsal 5    Assessment & Plan:   1. Pre-ulcerative calluses   2. Diabetes mellitus due to underlying condition with diabetic autonomic neuropathy, unspecified whether long term insulin use (HCC)   3. Hammertoe, bilateral   4. Pain due to onychomycosis of toenails of both feet        Patient was evaluated and treated and all questions answered.  Hammertoe contractures 2 through 5 bilaterally -I explained the patient given the nature of the hammertoe contracture in setting of uncontrolled diabetes with last A1c of 8.1 I believe patient will benefit from diabetic shoes.  She has a prescription from primary care physician to obtain diabetic shoes.  Should be scheduled to see EJ for diabetic shoes.  Left submetatarsal 5 preulcerative lesion -I explained the patient the etiology of preulcerative lesion various treatment options were discussed.  Patient is likely putting excessive pressure to the submetatarsal 4 on 5 leading to large preulcerative lesion.  I aggressively debrided down to healthy striated tissue using chisel and a blade.  No ulceration noted.  Onychomycosis with pain  -Nails palliatively debrided as below. -Educated on self-care  Procedure: Nail Debridement Rationale: pain  Type of Debridement: manual, sharp debridement. Instrumentation: Nail nipper, rotary burr. Number of Nails: 10  Procedures and Treatment: Consent by patient was obtained for treatment procedures. The patient understood the discussion of treatment and procedures well. All questions were answered thoroughly reviewed. Debridement of mycotic and hypertrophic toenails, 1 through 5 bilateral and clearing of subungual debris. No ulceration, no infection noted.  Return Visit-Office Procedure: Patient instructed to return to the office for a follow up visit 3 months for continued evaluation and treatment.  Boneta Lucks, DPM    No follow-ups on file.

## 2021-10-02 ENCOUNTER — Telehealth: Payer: Self-pay

## 2021-10-02 NOTE — Telephone Encounter (Signed)
[name] with [facility] Call reports she did a Mining engineer which shows "significant peripheral artery disease to both legs." Pt. Is having muscle cramps. BP during test 170/70,164/74. Will fax report.

## 2021-10-03 NOTE — Telephone Encounter (Signed)
Thanks.  I can refer her to Vascular specialist locally in [place] if she would like to pursue this. They would be the ones to do further testing and treatment of Peripheral Arterial Disease PAD in her legs causing symptoms.  Let me know and we can refer.  Nobie Putnam, [facility] 10/03/2021, 8:35 AM

## 2021-10-07 ENCOUNTER — Ambulatory Visit: Payer: 59 | Admitting: Cardiology

## 2021-10-07 ENCOUNTER — Other Ambulatory Visit: Payer: Self-pay

## 2021-10-07 ENCOUNTER — Encounter: Payer: Self-pay | Admitting: Student in an Organized Health Care Education/Training Program

## 2021-10-07 ENCOUNTER — Telehealth: Payer: Self-pay

## 2021-10-07 ENCOUNTER — Ambulatory Visit
Payer: 59 | Attending: Student in an Organized Health Care Education/Training Program | Admitting: Student in an Organized Health Care Education/Training Program

## 2021-10-07 VITALS — BP 202/78 | HR 72 | Temp 97.1°F | Resp 16 | Ht 67.0 in | Wt 235.0 lb

## 2021-10-07 DIAGNOSIS — G894 Chronic pain syndrome: Secondary | ICD-10-CM | POA: Diagnosis present

## 2021-10-07 DIAGNOSIS — M5412 Radiculopathy, cervical region: Secondary | ICD-10-CM | POA: Diagnosis present

## 2021-10-07 DIAGNOSIS — M5416 Radiculopathy, lumbar region: Secondary | ICD-10-CM

## 2021-10-07 DIAGNOSIS — M5030 Other cervical disc degeneration, unspecified cervical region: Secondary | ICD-10-CM

## 2021-10-07 DIAGNOSIS — E1142 Type 2 diabetes mellitus with diabetic polyneuropathy: Secondary | ICD-10-CM | POA: Diagnosis present

## 2021-10-07 DIAGNOSIS — M47816 Spondylosis without myelopathy or radiculopathy, lumbar region: Secondary | ICD-10-CM | POA: Diagnosis present

## 2021-10-07 DIAGNOSIS — M65342 Trigger finger, left ring finger: Secondary | ICD-10-CM

## 2021-10-07 MED ORDER — OXYCODONE HCL 5 MG PO TABS
5.0000 mg | ORAL_TABLET | Freq: Three times a day (TID) | ORAL | 0 refills | Status: DC | PRN
Start: 2021-10-13 — End: 2022-01-06

## 2021-10-07 MED ORDER — OXYCODONE HCL 5 MG PO TABS: 5.0000 mg | ORAL_TABLET | Freq: Three times a day (TID) | ORAL | 0 refills | Status: DC | PRN

## 2021-10-07 NOTE — Progress Notes (Signed)
PROVIDER NOTE: Information contained herein reflects review and annotations entered in association with encounter. Interpretation of such information and data should be left to medically-trained personnel. Information provided to patient can be located elsewhere in the medical record under "Patient Instructions". Document created using STT-dictation technology, any transcriptional errors that may result from process are unintentional.    Patient: [name]  Service Category: E/M  Provider: Gillis Santa, MD  DOB: February 19, 1966  DOS: 10/07/2021  Specialty: Interventional Pain Management  MRN: [id number]  Setting: Ambulatory outpatient  PCP: Olin Hauser, DO  Type: Established Patient    Referring Provider: Nobie Putnam *  Location: Office  Delivery: Face-to-face     HPI  [name], a 55 y.o. year old female, is here today because of her Diabetic polyneuropathy associated with type 2 diabetes mellitus ([place]) [E11.42]. Ms. Spallone primary complain today is chronic pain Last encounter: My last encounter with her was on 07/08/2021. Pertinent problems: Ms. Yadao has ESRD (end stage renal disease) on dialysis [facility]); Type 2 diabetes mellitus with ESRD (end-stage renal disease) ([place]); Chronic bilateral low back pain; DDD (degenerative disc disease), cervical; Spondylosis of cervical region without myelopathy or radiculopathy; PAD (peripheral artery disease) ([place]); Primary osteoarthritis of right knee; Chronic pain syndrome; Diabetic polyneuropathy associated with type 2 diabetes mellitus ([place]); Cervical radiculopathy; Lumbar radiculopathy; GAD (generalized anxiety disorder); and Recurrent major depression in partial remission (HCC) on their pertinent problem list. Vitals:  height is $RemoveB'5\' 7"'gyIlZjcN$  (1.702 m) and weight is 235 lb (106.6 kg). Her temporal temperature is 97.1 F (36.2 C) (abnormal). Her blood pressure is 202/78 (abnormal) and her pulse is 72. Her respiration is 16 and oxygen  saturation is 100%.   Reason for encounter: medication management.    Is having abdominal swelling and swelling of her thighs.  She feels that this could be related to her ascites.  Has an appointment with gastroenterology in early December. We have discussed Qutenza for painful diabetic polyneuropathy of her bilateral feet.  Risk and benefits were discussed at length.  Patient's will think about this and let us know if she would like to proceed. Recommend physical therapy. Refill of medications as below, no change in dose.  Pharmacotherapy Assessment   Analgesic: Oxycodone 5 mg q8 hrs prn quantity 75/month; MME equals 22.5    Monitoring: [place] PMP: PDMP not reviewed this encounter.       Pharmacotherapy: No side-effects or adverse reactions reported. Compliance: No problems identified. Effectiveness: Clinically acceptable.  Rise Patience, RN  10/07/2021  8:26 AM  Sign when Signing Visit Nursing Pain Medication Assessment:  Safety precautions to be maintained throughout the outpatient stay will include: orient to surroundings, keep bed in low position, maintain call bell within reach at all times, provide assistance with transfer out of bed and ambulation.  Medication Inspection Compliance: Pill count conducted under aseptic conditions, in front of the patient. Neither the pills nor the bottle was removed from the patient's sight at any time. Once count was completed pills were immediately returned to the patient in their original bottle.  Medication: Oxycodone 5 mg Pill/Patch Count:  16 of 75 pills remain Pill/Patch Appearance: Markings consistent with prescribed medication Bottle Appearance: Standard pharmacy container. Clearly labeled. Filled Date: 09 / 24 / 2022 Last Medication intake:  Today         ROS  Constitutional: Denies any fever or chills Gastrointestinal:  Abdominal pain, swelling Musculoskeletal:  low back pain Neurological: No reported episodes of acute onset  apraxia, aphasia, dysarthria, agnosia, amnesia, paralysis, loss of coordination, or loss of consciousness  Medication Review  Abatacept, INSULIN SYRINGE .5CC/29G, Insulin Pen Needle, brimonidine, diltiazem, dorzolamidel-timolol, gentamicin cream, glucose blood, heparin, insulin aspart, insulin glargine, ipratropium, ketoconazole, levocetirizine, midodrine, omeprazole, oxyCODONE, promethazine, and torsemide  History Review  Allergy: Ms. Bloch is allergic to cinnamon, garlic, onion, tylenol [acetaminophen], ciprofloxacin, ginger, and prednisone. Drug: Ms. Robbs  reports no history of drug use. Alcohol:  reports no history of alcohol use. Tobacco:  reports that she quit smoking about 7 weeks ago. Her smoking use included cigarettes. She has a 5.00 pack-year smoking history. She has never used smokeless tobacco. Social: Ms. Jaeger  reports that she quit smoking about 7 weeks ago. Her smoking use included cigarettes. She has a 5.00 pack-year smoking history. She has never used smokeless tobacco. She reports that she does not drink alcohol and does not use drugs. Medical:  has a past medical history of Allergy, Anemia, Anxiety, Arthritis, Ataxia, COPD (chronic obstructive pulmonary disease) ([place]), Depression, Diabetes mellitus with complication ([place]), Dialysis patient ([place]), ESRD on hemodialysis ([place]), Fistula, Hepatitis (2003), Hiatal hernia, Hypercholesterolemia, Hypertension, Migraine, Neuropathy, diabetic ([place]), Non-alcoholic cirrhosis ([employer]), Peripheral vascular disease ([place]), Pneumonia (2015), Psoriasis, Renal insufficiency, Tobacco dependence, and Wears dentures. Surgical: Ms. Salinas  has a past surgical history that includes Tonsillectomy; rt. tubal and ovary removed; Cholecystectomy; Cardiac catheterization (N/A, 07/25/2015); Cardiac catheterization (Left, 07/25/2015); Cardiac catheterization (Left, 10/07/2015); Cardiac catheterization (N/A, 10/07/2015); Cardiac catheterization (10/07/2015); Cardiac  catheterization (N/A, 12/17/2015); Incision and drainage abscess (N/A, 07/16/2016); cyst removed  from left hand (Left, 1989); AV fistula placement (Left, 11/2014); Cardiac catheterization (N/A, 01/11/2017); Cardiac catheterization (N/A, 01/11/2017); DIALYSIS/PERMA CATHETER INSERTION (N/A, 05/20/2017); Cesarean section; Tubal ligation; Colonoscopy (N/A, 09/22/2017); Esophagogastroduodenoscopy (N/A, 09/22/2017); polypectomy (09/22/2017); A/V SHUNT INTERVENTION (N/A, 12/16/2017); STENT PLACEMENT VASCULAR ([facility] HX) (Left, 03/2018); Upper Extremity Angiography (Left, 09/29/2019); Aqueous shunt (Left, 12/20/2019); Colonoscopy with propofol (N/A, 04/16/2020); and A/V Fistulagram (Left, 04/23/2020). Family: family history includes Heart disease in her mother; Hypertension in her father and mother; Skin cancer in her father.  Laboratory Chemistry Profile   Renal Lab Results  Component Value Date   BUN 52 (H) 05/01/2019   CREATININE 8.2 (A) 07/17/2020   BCR 10 12/27/2017   GFRAA 6 (L) 05/01/2019   GFRNONAA 6 (L) 05/01/2019     Hepatic Lab Results  Component Value Date   AST 15 07/17/2020   ALT 12 03/26/2020   ALBUMIN 4.0 07/17/2020   ALKPHOS 74 07/17/2020   HCVAB >11.0 (H) 12/16/2016   LIPASE 21 04/30/2019     Electrolytes Lab Results  Component Value Date   NA 137 07/17/2020   K 5.5 (A) 07/17/2020   CL 101 07/17/2020   CALCIUM 7.6 (A) 07/17/2020   MG 2.4 12/14/2017   PHOS 4.2 05/01/2019     Bone No results found for: VD25OH, VD125OH2TOT, QZ0092ZR0, QT6226JF3, 25OHVITD1, 25OHVITD2, 25OHVITD3, TESTOFREE, TESTOSTERONE   Inflammation (CRP: Acute Phase) (ESR: Chronic Phase) Lab Results  Component Value Date   CRP <0.8 12/16/2016   ESRSEDRATE 117 (H) 10/12/2017   LATICACIDVEN 1.6 04/30/2019       Note: Above Lab results reviewed.  Recent Imaging Review  DG Lumbar Spine Complete W/Bend CLINICAL DATA:  Chronic low back pain without known injury.  EXAM: LUMBAR SPINE - COMPLETE WITH BENDING  VIEWS  COMPARISON:  None.  FINDINGS: No fracture or spondylolisthesis is noted. No change in vertebral body alignment is noted on flexion or extension views. Mild degenerative disc disease is noted  at L1-2, L2-3 and L3-4.  IMPRESSION: Mild multilevel degenerative disc disease. No acute abnormality seen in the lumbar spine.  Electronically Signed   By: Marijo Conception M.D.   On: 02/05/2021 08:27 Note: Reviewed        Physical Exam  General appearance: Well nourished, well developed, and well hydrated. In no apparent acute distress Mental status: Alert, oriented x 3 (person, place, & time)       Respiratory: No evidence of acute respiratory distress Eyes: PERLA Vitals: BP (!) 202/78   Pulse 72   Temp (!) 97.1 F (36.2 C) (Temporal)   Resp 16   Ht $R'5\' 7"'vt$  (1.702 m)   Wt 235 lb (106.6 kg)   SpO2 100%   BMI 36.81 kg/m  BMI: Estimated body mass index is 36.81 kg/m as calculated from the following:   Height as of this encounter: $RemoveBeforeD'5\' 7"'PqvBzIaKkdWApz$  (1.702 m).   Weight as of this encounter: 235 lb (106.6 kg). Ideal: Ideal body weight: 61.6 kg (135 lb 12.9 oz) Adjusted ideal body weight: 79.6 kg (175 lb 7.7 oz)    Lumbar Spine Area Exam  Skin & Axial Inspection: No masses, redness, or swelling Alignment: Symmetrical Functional ROM: Pain restricted ROM affecting both sides Stability: No instability detected Muscle Tone/Strength: Functionally intact. No obvious neuro-muscular anomalies detected. Sensory (Neurological): Musculoskeletal pain pattern   Provocative Tests: Hyperextension/rotation test: (+) bilaterally for facet joint pain. Lumbar quadrant test (Kemp's test): (+) bilaterally for facet joint pain.   Gait & Posture Assessment  Ambulation: Unassisted Gait: Antalgic gait (limping) Posture: Difficulty standing up straight, due to pain   Neuropathic pain of bilateral feet.  Paresthesias present.   Assessment   Status Diagnosis  Persistent Persistent Persistent 1. Diabetic  polyneuropathy associated with type 2 diabetes mellitus (HCC)   2. Cervical radiculopathy   3. Lumbar spondylosis   4. Lumbar radiculopathy   5. Trigger finger, left ring finger   6. DDD (degenerative disc disease), cervical   7. Chronic pain syndrome         Plan of Care  [name] has a current medication list which includes the following long-term medication(s): orencia, diltiazem, insulin glargine, ipratropium, levocetirizine, novolog, [START ON 10/13/2021] oxycodone, [START ON 11/12/2021] oxycodone, and [START ON 12/12/2021] oxycodone.  Pharmacotherapy (Medications Ordered): Meds ordered this encounter  Medications   oxyCODONE (OXY IR/ROXICODONE) 5 MG immediate release tablet    Sig: Take 1 tablet (5 mg total) by mouth every 8 (eight) hours as needed for severe pain. Must last 30 days.    Dispense:  75 tablet    Refill:  0    Chronic Pain. (STOP Act - Not applicable). Fill one day early if closed on scheduled refill date.   oxyCODONE (OXY IR/ROXICODONE) 5 MG immediate release tablet    Sig: Take 1 tablet (5 mg total) by mouth every 8 (eight) hours as needed for severe pain. Must last 30 days.    Dispense:  75 tablet    Refill:  0    Chronic Pain. (STOP Act - Not applicable). Fill one day early if closed on scheduled refill date.   oxyCODONE (OXY IR/ROXICODONE) 5 MG immediate release tablet    Sig: Take 1 tablet (5 mg total) by mouth every 8 (eight) hours as needed for severe pain. Must last 30 days.    Dispense:  75 tablet    Refill:  0    Chronic Pain. (STOP Act - Not applicable). Fill one day early  if closed on scheduled refill date.   Patient has a history of diabetic peripheral neuropathy.  We discussed Qutenza capsaicin topical therapy for her condition.  The patient does have multiple comorbidities.  We discussed the benefits of topical Qutenza which includes not systemic absorption minimizing drug drug interactions, sustained pain relief and nonopioid.     Follow-up plan:   Return in about 3 months (around 01/07/2022).   Recent Visits No visits were found meeting these conditions. Showing recent visits within past 90 days and meeting all other requirements Today's Visits Date Type Provider Dept  10/07/21 Office Visit Gillis Santa, MD [facility]  Showing today's visits and meeting all other requirements Future Appointments No visits were found meeting these conditions. Showing future appointments within next 90 days and meeting all other requirements I discussed the assessment and treatment plan with the patient. The patient was provided an opportunity to ask questions and all were answered. The patient agreed with the plan and demonstrated an understanding of the instructions.  Patient advised to call back or seek an in-person evaluation if the symptoms or condition worsens.  Duration of encounter:30 minutes.  Note by: Gillis Santa, MD Date: 10/07/2021; Time: 9:34 AM

## 2021-10-07 NOTE — Progress Notes (Signed)
Nursing Pain Medication Assessment:  Safety precautions to be maintained throughout the outpatient stay will include: orient to surroundings, keep bed in low position, maintain call bell within reach at all times, provide assistance with transfer out of bed and ambulation.  Medication Inspection Compliance: Pill count conducted under aseptic conditions, in front of the patient. Neither the pills nor the bottle was removed from the patient's sight at any time. Once count was completed pills were immediately returned to the patient in their original bottle.  Medication: Oxycodone 5 mg Pill/Patch Count:  16 of 75 pills remain Pill/Patch Appearance: Markings consistent with prescribed medication Bottle Appearance: Standard pharmacy container. Clearly labeled. Filled Date: 09 / 24 / 2022 Last Medication intake:  Today

## 2021-10-07 NOTE — Telephone Encounter (Signed)
This patient was scheduled to see Dr. Garen Lah as a new patient on his virtual day. However, this patient is not new and needs to be rescheduled. [name] was unable to reach patient per documentation below:  NOT new patient - saw C Sharolyn Douglas 01/2019  R/S with Arida or APP 10/17 LMOV to reschedule  10/12 LMOV to reschedule  10/10 LMOV to rescheule  EKG PCP in EPIC No previous cardiac hx per pt "other than this office"  Sending to scheduling to attempt to reschedule.

## 2021-10-10 ENCOUNTER — Other Ambulatory Visit: Payer: Self-pay | Admitting: Family Medicine

## 2021-10-10 ENCOUNTER — Other Ambulatory Visit: Payer: Self-pay | Admitting: Internal Medicine

## 2021-10-10 ENCOUNTER — Other Ambulatory Visit: Payer: Self-pay | Admitting: Podiatry

## 2021-10-10 DIAGNOSIS — J00 Acute nasopharyngitis [common cold]: Secondary | ICD-10-CM

## 2021-10-10 DIAGNOSIS — N186 End stage renal disease: Secondary | ICD-10-CM

## 2021-10-10 DIAGNOSIS — E1122 Type 2 diabetes mellitus with diabetic chronic kidney disease: Secondary | ICD-10-CM

## 2021-10-11 NOTE — Telephone Encounter (Signed)
Requested medications are due for refill today NO, filled 09/12/21 for 90 day supply  Requested medications are on the active medication list yes  Last refill 09/12/21  Last visit 08/21/21  Future visit scheduled 10/2021  Notes to clinic requesting refill too soon, please assess.   Requested Prescriptions  Pending Prescriptions Disp Refills   levocetirizine (XYZAL) 5 MG tablet [Pharmacy Med Name: levocetirizine 5 mg tablet] 90 tablet 0    Sig: TAKE ONE TABLET BY MOUTH IN THE EVENING     Ear, Nose, and Throat:  Antihistamines Passed - 10/10/2021  2:44 PM      Passed - Valid encounter within last 12 months    Recent Outpatient Visits           1 month ago Type 2 diabetes mellitus with ESRD (end-stage renal disease) ([place])   [facility] Olin Hauser, DO   7 months ago Type 2 diabetes mellitus with ESRD (end-stage renal disease) ([place])   [facility] Olin Hauser, DO   1 year ago Type 2 diabetes mellitus with ESRD (end-stage renal disease) ([place])   [facility] Olin Hauser, DO   1 year ago Type 2 diabetes mellitus with ESRD (end-stage renal disease) ([place])   [facility] Olin Hauser, DO   2 years ago Type 2 diabetes mellitus with ESRD (end-stage renal disease) ([place])   [facility] Olin Hauser, DO       Future Appointments             In 1 month Cannady, Barbaraann Faster, NP [employer], [place]   In 1 month Karamalegos, [name], DO [facility], [place]   In 1 month [name], [place]

## 2021-10-11 NOTE — Telephone Encounter (Signed)
  Notes to clinic filled yesterday.  Requested Prescriptions  Pending Prescriptions Disp Refills   TRUEPLUS INSULIN SYRINGE 29G X 1/2" 0.5 ML MISC [Pharmacy Med Name: TRUEplus Insulin 0.5 mL 29 gauge x 1/2" syringe] 100 each 1    Sig: USE 4 TIMES A DAY WITH LANTUS & NOVOLOG     There is no refill protocol information for this order

## 2021-10-22 ENCOUNTER — Other Ambulatory Visit: Payer: Self-pay

## 2021-10-22 DIAGNOSIS — K7469 Other cirrhosis of liver: Secondary | ICD-10-CM

## 2021-10-23 LAB — HEPATIC FUNCTION PANEL
ALT: 27 IU/L (ref 0–32)
AST: 25 IU/L (ref 0–40)
Albumin: 4.7 g/dL (ref 3.8–4.9)
Alkaline Phosphatase: 112 IU/L (ref 44–121)
Bilirubin Total: 0.3 mg/dL (ref 0.0–1.2)
Bilirubin, Direct: 0.11 mg/dL (ref 0.00–0.40)
Total Protein: 6.9 g/dL (ref 6.0–8.5)

## 2021-10-29 ENCOUNTER — Other Ambulatory Visit: Payer: Self-pay

## 2021-10-29 ENCOUNTER — Other Ambulatory Visit: Payer: 59

## 2021-10-30 ENCOUNTER — Telehealth: Payer: Self-pay | Admitting: Podiatry

## 2021-10-30 NOTE — Telephone Encounter (Signed)
Received a call from a gentleman(Could not understand name) from [place] stating he was calling in regards to pts custom diabetic shoes that were ordered.   Upon checking pt is not getting custom shoes but is getting custom inserts and I gave the navigator the cpt codes we would use and he checked and said no auth was needed.

## 2021-11-09 ENCOUNTER — Encounter: Payer: Self-pay | Admitting: Nurse Practitioner

## 2021-11-11 ENCOUNTER — Other Ambulatory Visit: Payer: Self-pay | Admitting: Family Medicine

## 2021-11-11 ENCOUNTER — Ambulatory Visit (INDEPENDENT_AMBULATORY_CARE_PROVIDER_SITE_OTHER): Payer: 59 | Admitting: Nurse Practitioner

## 2021-11-11 ENCOUNTER — Other Ambulatory Visit: Payer: Self-pay

## 2021-11-11 ENCOUNTER — Encounter: Payer: Self-pay | Admitting: Nurse Practitioner

## 2021-11-11 VITALS — BP 152/68 | HR 74 | Temp 98.0°F | Wt 238.6 lb

## 2021-11-11 DIAGNOSIS — E1122 Type 2 diabetes mellitus with diabetic chronic kidney disease: Secondary | ICD-10-CM

## 2021-11-11 DIAGNOSIS — Z23 Encounter for immunization: Secondary | ICD-10-CM

## 2021-11-11 DIAGNOSIS — N186 End stage renal disease: Secondary | ICD-10-CM

## 2021-11-11 DIAGNOSIS — I152 Hypertension secondary to endocrine disorders: Secondary | ICD-10-CM

## 2021-11-11 DIAGNOSIS — L97521 Non-pressure chronic ulcer of other part of left foot limited to breakdown of skin: Secondary | ICD-10-CM

## 2021-11-11 DIAGNOSIS — G894 Chronic pain syndrome: Secondary | ICD-10-CM

## 2021-11-11 DIAGNOSIS — Z7689 Persons encountering health services in other specified circumstances: Secondary | ICD-10-CM

## 2021-11-11 DIAGNOSIS — J301 Allergic rhinitis due to pollen: Secondary | ICD-10-CM

## 2021-11-11 DIAGNOSIS — E11621 Type 2 diabetes mellitus with foot ulcer: Secondary | ICD-10-CM

## 2021-11-11 DIAGNOSIS — E1142 Type 2 diabetes mellitus with diabetic polyneuropathy: Secondary | ICD-10-CM

## 2021-11-11 DIAGNOSIS — E1159 Type 2 diabetes mellitus with other circulatory complications: Secondary | ICD-10-CM

## 2021-11-11 DIAGNOSIS — E113593 Type 2 diabetes mellitus with proliferative diabetic retinopathy without macular edema, bilateral: Secondary | ICD-10-CM

## 2021-11-11 DIAGNOSIS — F17219 Nicotine dependence, cigarettes, with unspecified nicotine-induced disorders: Secondary | ICD-10-CM

## 2021-11-11 DIAGNOSIS — I739 Peripheral vascular disease, unspecified: Secondary | ICD-10-CM

## 2021-11-11 DIAGNOSIS — Z1231 Encounter for screening mammogram for malignant neoplasm of breast: Secondary | ICD-10-CM

## 2021-11-11 DIAGNOSIS — B182 Chronic viral hepatitis C: Secondary | ICD-10-CM

## 2021-11-11 DIAGNOSIS — F411 Generalized anxiety disorder: Secondary | ICD-10-CM

## 2021-11-11 DIAGNOSIS — F3341 Major depressive disorder, recurrent, in partial remission: Secondary | ICD-10-CM

## 2021-11-11 DIAGNOSIS — E538 Deficiency of other specified B group vitamins: Secondary | ICD-10-CM

## 2021-11-11 DIAGNOSIS — M0579 Rheumatoid arthritis with rheumatoid factor of multiple sites without organ or systems involvement: Secondary | ICD-10-CM

## 2021-11-11 MED ORDER — NOVOLOG 100 UNIT/ML IJ SOLN: 5.0000 [IU] | Freq: Three times a day (TID) | INTRAMUSCULAR | 11 refills | Status: DC

## 2021-11-11 MED ORDER — HYDRALAZINE HCL 10 MG PO TABS: 10.0000 mg | ORAL_TABLET | Freq: Two times a day (BID) | ORAL | 4 refills | Status: DC

## 2021-11-11 MED ORDER — GLUCOSE BLOOD VI STRP: ORAL_STRIP | 12 refills | Status: DC

## 2021-11-11 MED ORDER — BUSPIRONE HCL 5 MG PO TABS: 10.0000 mg | ORAL_TABLET | Freq: Two times a day (BID) | ORAL | 4 refills | Status: DC | PRN

## 2021-11-11 NOTE — Assessment & Plan Note (Signed)
Chronic, stable.  Continue to monitor and continue to assess for skin breakdown.   

## 2021-11-11 NOTE — Assessment & Plan Note (Signed)
Followed by [facility] every 6 weeks, continue this collaboration. 

## 2021-11-11 NOTE — Progress Notes (Signed)
New Patient Office Visit  Subjective:  Patient ID: [name], female    DOB: 04-07-1966  Age: 55 y.o. MRN: [id number]  CC:  Chief Complaint  Patient presents with   Establish Care    Patient is here to establish care.    Hypertension    Patient states she has noticed her blood pressure has always been elevated and states she thinks it has been since she started Dialysis six to seven years ago. Patient states she is scheduled to see a new Cardiologist. Patient states when she goes to Dialysis in the mornings it is as high as 200's and she states when she starts. They have to give her medication cause her blood pressure drastically to as low as 60/40.    Medication Management    Patient states she is here to discuss getting her prescription for Insulin and states that the provider she has previously saw would not appeal PA for patient to receive her Novalog. Patient states the provider just changed her prescription to Humalog and she says the Humalog does not work for her. Patient states it makes her sick and she states it feels as if she is having a gallbladder attack while she was at Dialysis yesterday.    HPI IVEY NEMBHARD presents for new patient visit to establish care.  Introduced to Designer, jewellery role and practice setting.  All questions answered.  Discussed provider/patient relationship and expectations.  Previously followed at [facility], but wanting to change.  CIRRHOSIS: She is followed by GI for history of Hep C she went through [place] procedure for -- next visit 11/25/21.  She reports ongoing GI issues, including incontinence.    DIABETES Last A1c 8.5% in October with dialysis -- taking Lantus and Humalog. She reports previous provider would not appeal her script for Novolog which works better for her, instead switched her to Humalog which is making her nauseous and feels like having a gall bladder attack with this (had this removed in 2005-2006).  She reports a  patient at dialysis gave her some Novolog which she has been using and is almost out of.  She prefers vials vs pens -- that way she can see how much she is getting.  History of ulcer to foot, which she reports is followed by podiatry, last in June 2022. Hypoglycemic episodes:no Polydipsia/polyuria: no Visual disturbance: no Chest pain: no Paresthesias: yes Glucose Monitoring: no  Accucheck frequency: Not Checking no strips  Fasting glucose:  Post prandial:  Evening:  Before meals: Taking Insulin?: yes  Long acting insulin:  Short acting insulin: Blood Pressure Monitoring: a few times a week Retinal Examination: Up to Date attends every 6 weeks -- glaucoma left eye and retinopathy both eyes -- gets injections == [facility]  Foot Exam: Up to Date Diabetic Education: Not Completed Pneumovax: Up to Date Influenza: Up to Date Aspirin: no   CHRONIC KIDNEY DISEASE With ESRD followed by nephrology.  Started on dialysis 7 years ago.  She reports elevations in BP since then, but when in dialysis her BP will drop to lower levels.  She is scheduled to see cardiology for further assessment next Tuesday.    Takes occasional Omeprazole for heart burn.  Has esophageal spasms at baseline -- notices issues more with hot food.   CKD status: stable Medications renally dose: yes Previous renal evaluation: yes Pneumovax:  Up to Date Influenza Vaccine:  Up to Date   HYPERTENSION / HYPERLIPIDEMIA Continues on Diltiazem and  Torsemide + no current statin.  Does not recall taking cholesterol medication in past.  She has been on ACE/ARB in past, but has hyperkalemia at baseline -- can not take these -- last K+ with dialysis was 6.  Diltiazem lowers her heart rate.  She takes Midodrine with her to dialysis due to low BP with dialysis.     Satisfied with current treatment? yes Duration of hypertension: chronic BP monitoring frequency: a few times a week BP range: 170/70 range BP medication side effects:  no Past BP meds: multiple Duration of hyperlipidemia: chronic Past cholesterol medications: none -- Questran at one point for GI Medication compliance: good compliance Aspirin: no Recent stressors: no Recurrent headaches: no Visual changes: no Palpitations: no Dyspnea: no Chest pain: no Lower extremity edema: no Dizzy/lightheaded: no   ANXIETY/STRESS No current medications for mood.  She reports wishes to be on something she only takes while in dialysis chair, but they would not give this to her. Duration:stable Anxious mood: yes Excessive worrying: yes Irritability: yes  Sweating: no Nausea: no Palpitations:no Hyperventilation: no Panic attacks:  yes Agoraphobia: no  Obscessions/compulsions: no Depressed mood: yes Depression screen [facility] 2/9 11/11/2021 08/21/2021 07/08/2021 04/01/2021 02/18/2021  Decreased Interest 2 2 0 0 1  Down, Depressed, Hopeless 2 2 0 0 0  PHQ - 2 Score 4 4 0 0 1  Altered sleeping 3 2 - - 2  Tired, decreased energy 3 2 - - 3  Change in appetite 2 2 - - 0  Feeling bad or failure about yourself  3 2 - - 0  Trouble concentrating - 0 - - 0  Moving slowly or fidgety/restless 2 3 - - 0  Suicidal thoughts 0 0 - - 0  PHQ-9 Score 17 15 - - 6  Difficult doing work/chores Somewhat difficult Very difficult - - Somewhat difficult  Some recent data might be hidden  Anhedonia: no Weight changes: no Insomnia: yes hard to fall asleep  Hypersomnia: no Fatigue/loss of energy: yes Feelings of worthlessness: no Feelings of guilt: yes Impaired concentration/indecisiveness: no Suicidal ideations: no  Crying spells: yes Recent Stressors/Life Changes: yes   Relationship problems: no   Family stress: yes     Financial stress: no    Job stress: no    Recent death/loss: no  GAD 7 : Generalized Anxiety Score 11/11/2021 08/21/2021 07/30/2020 06/29/2017  Nervous, Anxious, on Edge 3 3 3 3   Control/stop worrying 3 2 3  0  Worry too much - different things 3 2 3  0  Trouble  relaxing 3 3 3 1   Restless 3 3 3 3   Easily annoyed or irritable 3 3 3  0  Afraid - awful might happen 2 3 3 1   Total GAD 7 Score 20 19 21 8   Anxiety Difficulty Somewhat difficult Very difficult Extremely difficult Very difficult    CHRONIC PAIN & RA Followed by rheumatology and pain clinic.  Last rheumatology visit on 09/23/21 with infusions and last pain clinic 10/07/21. Present dose:  Morphine equivalents Pain control status: controlled Duration: chronic Location: three bulging discs in neck and lower spine Quality: dull, aching, and throbbing Current Pain Level: 7/10 Previous Pain Level: 7/10 Breakthrough pain: yes -- gets extra 15 MG for breakthrough pain Benefit from narcotic medications: yes What Activities task can be accomplished with current medication? ADL's Interested in weaning off narcotics:no   Stool softners/OTC fiber: no  Previous pain specialty evaluation: yes Non-narcotic analgesic meds: yes Narcotic contract:  with pain management  Past Medical History:  Diagnosis Date   Allergy    Anemia    Anxiety    worse when not at home - bowel incontinence   Arthritis    Ataxia    COPD (chronic obstructive pulmonary disease) (HCC)    Depression    Diabetes mellitus with complication ([place])    Dialysis patient ([place])    ESRD on hemodialysis ([place])    MWF dialysis   Fistula    left upper arm   Hepatitis 2003   Hep C   Hiatal hernia    Hypercholesterolemia    Hypertension    Migraine    Neuropathy, diabetic (HCC)    lower legs   Non-alcoholic cirrhosis (HCC)    Peripheral vascular disease ([place])    Pneumonia 2015   Psoriasis    Renal insufficiency    Tobacco dependence    Wears dentures    full upper, partial lower    Past Surgical History:  Procedure Laterality Date   A/V FISTULAGRAM Left 04/23/2020   Procedure: A/V FISTULAGRAM;  Surgeon: Katha Cabal, MD;  Location: [place] [facility];  Service: Cardiovascular;  Laterality: Left;   A/V SHUNT  INTERVENTION N/A 12/16/2017   Procedure: A/V SHUNT INTERVENTION;  Surgeon: Algernon Huxley, MD;  Location: [place] [facility];  Service: Cardiovascular;  Laterality: N/A;   AQUEOUS SHUNT Left 12/20/2019   Procedure: AHMED TUBE SHUNT WITH TUTOPLAST AND AC WASHOUT LEFT DIABETIC;  Surgeon: Leandrew Koyanagi, MD;  Location: [place];  Service: Ophthalmology;  Laterality: Left;  Diabetic - insulin   AV FISTULA PLACEMENT Left 11/2014   CESAREAN SECTION     CHOLECYSTECTOMY     COLONOSCOPY N/A 09/22/2017   Procedure: COLONOSCOPY;  Surgeon: Lin Landsman, MD;  Location: [place];  Service: Endoscopy;  Laterality: N/A;   COLONOSCOPY WITH PROPOFOL N/A 04/16/2020   Procedure: COLONOSCOPY WITH PROPOFOL;  Surgeon: [name], MD;  Location: [facility];  Service: Endoscopy;  Laterality: N/A;  Priority 4   cyst removed  from left hand Left 1989   DIALYSIS/PERMA CATHETER INSERTION N/A 05/20/2017   Procedure: Dialysis/Perma Catheter Insertion and fistulagram/LUE angiogram;  Surgeon: Algernon Huxley, MD;  Location: [place] [facility];  Service: Cardiovascular;  Laterality: N/A;   ESOPHAGOGASTRODUODENOSCOPY N/A 09/22/2017   Procedure: ESOPHAGOGASTRODUODENOSCOPY (EGD);  Surgeon: Lin Landsman, MD;  Location: [place];  Service: Endoscopy;  Laterality: N/A;   INCISION AND DRAINAGE ABSCESS N/A 07/16/2016   Procedure: INCISION AND DRAINAGE ABSCESS;  Surgeon: Carloyn Manner, MD;  Location: [facility];  Service: ENT;  Laterality: N/A;   PERIPHERAL VASCULAR CATHETERIZATION N/A 07/25/2015   Procedure: A/V Shuntogram/Fistulagram;  Surgeon: Algernon Huxley, MD;  Location: [place] [facility];  Service: Cardiovascular;  Laterality: N/A;   PERIPHERAL VASCULAR CATHETERIZATION Left 07/25/2015   Procedure: A/V Shunt Intervention;  Surgeon: Algernon Huxley, MD;  Location: [place] [facility];  Service: Cardiovascular;  Laterality: Left;   PERIPHERAL VASCULAR CATHETERIZATION Left 10/07/2015    Procedure: A/V Shuntogram/Fistulagram;  Surgeon: Algernon Huxley, MD;  Location: [place] [facility];  Service: Cardiovascular;  Laterality: Left;   PERIPHERAL VASCULAR CATHETERIZATION N/A 10/07/2015   Procedure: A/V Shunt Intervention;  Surgeon: Algernon Huxley, MD;  Location: [place] [facility];  Service: Cardiovascular;  Laterality: N/A;   PERIPHERAL VASCULAR CATHETERIZATION  10/07/2015   Procedure: Dialysis/Perma Catheter Insertion;  Surgeon: Algernon Huxley, MD;  Location: [place] [facility];  Service: Cardiovascular;;   PERIPHERAL  VASCULAR CATHETERIZATION N/A 12/17/2015   Procedure: Dialysis/Perma Catheter Removal;  Surgeon: Katha Cabal, MD;  Location: [place] [facility];  Service: Cardiovascular;  Laterality: N/A;   PERIPHERAL VASCULAR CATHETERIZATION N/A 01/11/2017   Procedure: Visceral Angiography;  Surgeon: Algernon Huxley, MD;  Location: [place] [facility];  Service: Cardiovascular;  Laterality: N/A;   PERIPHERAL VASCULAR CATHETERIZATION N/A 01/11/2017   Procedure: Visceral Artery Intervention;  Surgeon: Algernon Huxley, MD;  Location: [place] [facility];  Service: Cardiovascular;  Laterality: N/A;   POLYPECTOMY  09/22/2017   Procedure: POLYPECTOMY;  Surgeon: Lin Landsman, MD;  Location: [place];  Service: Endoscopy;;   rt. tubal and ovary removed     STENT PLACEMENT VASCULAR ([place] HX) Left 03/2018   Performed at [facility] using EV3 protg GPS stent graph [id number] lot [id number]   TONSILLECTOMY     TUBAL LIGATION     UPPER EXTREMITY ANGIOGRAPHY Left 09/29/2019   Procedure: UPPER EXTREMITY ANGIOGRAPHY;  Surgeon: Katha Cabal, MD;  Location: [place] [facility];  Service: Cardiovascular;  Laterality: Left;    Family History  Problem Relation Age of Onset   Hypertension Mother    Heart disease Mother    Hypertension Father    Skin cancer Father     Social History   Socioeconomic History   Marital status: Legally Separated    Spouse  name: Not on file   Number of children: Not on file   Years of education: Not on file   Highest education level: Not on file  Occupational History   Occupation: disabled  Tobacco Use   Smoking status: Former    Packs/day: 0.25    Years: 20.00    Pack years: 5.00    Types: Cigarettes    Quit date: 08/13/2021    Years since quitting: 0.2   Smokeless tobacco: Never  Vaping Use   Vaping Use: Never used  Substance and Sexual Activity   Alcohol use: No   Drug use: No   Sexual activity: Not on file  Other Topics Concern   Not on file  Social History Narrative   Patient lives at an extended stay hotel currently.     Social Determinants of Health   Financial Resource Strain: Low Risk    Difficulty of Paying Living Expenses: Not hard at all  Food Insecurity: No Food Insecurity   Worried About Charity fundraiser in the Last Year: Never true   Arboriculturist in the Last Year: Never true  Transportation Needs: Unmet Transportation Needs   Lack of Transportation (Medical): Yes   Lack of Transportation (Non-Medical): No  Physical Activity: Inactive   Days of Exercise per Week: 0 days   Minutes of Exercise per Session: 0 min  Stress: No Stress Concern Present   Feeling of Stress : Only a little  Social Connections: Moderately Isolated   Frequency of Communication with Friends and Family: More than three times a week   Frequency of Social Gatherings with Friends and Family: More than three times a week   Attends Religious Services: Never   Marine scientist or Organizations: No   Attends Music therapist: Never   Marital Status: Living with partner  Intimate Partner Violence: Not on file    ROS Review of Systems  Constitutional:  Negative for activity change, appetite change, diaphoresis, fatigue and fever.  Respiratory:  Negative for cough, chest tightness, shortness of breath and wheezing.  Cardiovascular:  Negative for chest pain, palpitations and leg  swelling.  Gastrointestinal: Negative.   Endocrine: Negative for cold intolerance, heat intolerance, polydipsia, polyphagia and polyuria.  Musculoskeletal:  Positive for arthralgias.  Neurological: Negative.   Psychiatric/Behavioral:  Positive for decreased concentration and sleep disturbance. Negative for self-injury and suicidal ideas. The patient is nervous/anxious.    Objective:   Today's Vitals: BP (!) 152/68 (BP Location: Right Arm)   Pulse 74   Temp 98 F (36.7 C) (Oral)   Wt 238 lb 9.6 oz (108.2 kg)   SpO2 99%   BMI 37.37 kg/m   Physical Exam Vitals and nursing note reviewed.  Constitutional:      General: She is awake. She is not in acute distress.    Appearance: She is well-developed and well-groomed. She is obese. She is not ill-appearing or toxic-appearing.  HENT:     Head: Normocephalic.     Right Ear: Hearing, ear canal and external ear normal. A middle ear effusion is present.     Left Ear: Hearing, ear canal and external ear normal. A middle ear effusion is present.  Eyes:     General: Lids are normal.        Right eye: No discharge.        Left eye: No discharge.     Conjunctiva/sclera: Conjunctivae normal.     Pupils: Pupils are equal, round, and reactive to light.  Neck:     Thyroid: No thyromegaly.     Vascular: No carotid bruit.  Cardiovascular:     Rate and Rhythm: Normal rate and regular rhythm.     Heart sounds: Normal heart sounds. No murmur heard.   No gallop.     Arteriovenous access: Left arteriovenous access is present. Pulmonary:     Effort: Pulmonary effort is normal. No accessory muscle usage or respiratory distress.     Breath sounds: Normal breath sounds.  Abdominal:     General: Bowel sounds are normal.     Palpations: Abdomen is soft. There is no hepatomegaly or splenomegaly.  Musculoskeletal:     Cervical back: Normal range of motion and neck supple.     Right lower leg: No edema.     Left lower leg: No edema.  Lymphadenopathy:      Cervical: No cervical adenopathy.  Skin:    General: Skin is warm and dry.  Neurological:     Mental Status: She is alert and oriented to person, place, and time.  Psychiatric:        Attention and Perception: Attention normal.        Mood and Affect: Mood normal. Affect is tearful.        Speech: Speech normal.        Behavior: Behavior normal. Behavior is cooperative.        Thought Content: Thought content normal.    Assessment & Plan:   Problem List Items Addressed This Visit       Cardiovascular and Mediastinum   Hypertension associated with diabetes ([place])    Chronic, ongoing with level above goal in office today.  She does run low during dialysis though -- continue Midodrine as needed in dialysis.  Scheduled to see cardiology next week.  Can not take ACE/ARB due to underlying hyperkalemia.  Continue Diltiazem and Torsemide at current doses + add on Hydralazine 10 MG BID (discussed with patient, she has taken this at dialysis before) -- recommend she hold this morning of dialysis days to prevent lows.  Focus on DASH diet at home.  Check BP daily and document for provider visits.  CMP and TSH today.  Return in 6 weeks.      Relevant Medications   hydrALAZINE (APRESOLINE) 10 MG tablet   NOVOLOG 100 UNIT/ML injection   Other Relevant Orders   Lipid Panel w/o Chol/HDL Ratio   TSH   AMB Referral to [facility] Coordinaton   PAD (peripheral artery disease) (HCC)    Chronic, stable.  Continue to monitor and continue to assess for skin breakdown.        Relevant Medications   hydrALAZINE (APRESOLINE) 10 MG tablet     Respiratory   Seasonal allergic rhinitis due to pollen    Ongoing issue with poor control, continue Xyzal daily and will get back into ENT for further assessment.      Relevant Orders   Ambulatory referral to ENT     Digestive   Chronic hepatitis C without hepatic coma ([place])    Chronic, ongoing -- followed by GI, continue this collaboration, will get  into PT to assess if underlying pelvic floor dysfunction present -- she reports long time stool incontinence.      Relevant Orders   Magnesium   AMB Referral to [facility] Coordinaton     Endocrine   Diabetic foot ulcer ([place])    Followed by podiatry, continue this collaboration.      Relevant Medications   NOVOLOG 100 UNIT/ML injection   Diabetic polyneuropathy associated with type 2 diabetes mellitus (HCC)    Chronic, ongoing with recent dialysis A1c 8.5% on review of labs she brought.  At this time will continue Lantus as ordered and restart Novolog which she tolerated better then Humalog -- Humalog causes GI issues for her, will work on appeal for this.  Adjust dosing as needed.  Plan on A1c check in January 2023 + urine ALB.  Recommend she check blood sugar 3-4 times a day, may benefit from Freestyle in future.  New supplies sent.  Return in 6 weeks.        Relevant Medications   busPIRone (BUSPAR) 5 MG tablet   NOVOLOG 100 UNIT/ML injection   Proliferative diabetic retinopathy associated with type 2 diabetes mellitus ([place])    Followed by [facility] every 6 weeks, continue this collaboration.      Relevant Medications   NOVOLOG 100 UNIT/ML injection   Type 2 diabetes mellitus with ESRD (end-stage renal disease) ([place]) - Primary    Chronic, ongoing with recent dialysis A1c 8.5% on review of labs she brought.  At this time will continue Lantus as ordered and restart Novolog which she tolerated better then Humalog -- Humalog causes GI issues for her, will work on appeal for this.  Adjust dosing as needed.  Plan on A1c check in January 2023 + urine ALB.  Recommend she check blood sugar 3-4 times a day, may benefit from Freestyle in future.  New supplies sent.  Return in 6 weeks.        Relevant Medications   NOVOLOG 100 UNIT/ML injection   Other Relevant Orders   Comprehensive metabolic panel   AMB Referral to [facility] Coordinaton     Nervous and Auditory   Nicotine  dependence, cigarettes, w unsp disorders    Recommend ongoing cessation, she quit in August 2022.        Musculoskeletal and Integument   Rheumatoid arthritis involving multiple sites with positive rheumatoid factor ([place])    Followed by rheumatology and  pain management, continue these collaborations.  Recent notes reviewed.  Continue medications as prescribed by them.  Will place PT order today, which may offer benefit.        Other   Chronic pain syndrome    Followed by rheumatology and pain management, continue these collaborations.  Recent notes reviewed.  Continue medications as prescribed by them.  Will place PT order today, which may offer benefit.  She is aware all opioids must come from pain management.      Relevant Orders   Ambulatory referral to Physical Therapy   GAD (generalized anxiety disorder)    Refer to depression plan of care.      Relevant Medications   busPIRone (BUSPAR) 5 MG tablet   Recurrent major depression in partial remission (HCC)    Chronic, ongoing without SI/HI.  She does not want maintenance medication.  Would avoid benzos due to her opioid use for pain, discussed at length with her.  At this time will trial Buspar 10 MG BID as needed.  She would benefit from a maintenance regimen in future that is renal dosed, but declines today.  Return in 6 weeks.      Relevant Medications   busPIRone (BUSPAR) 5 MG tablet   Other Visit Diagnoses     B12 deficiency       History of low levels reported, will check today and initiate supplement as needed.   Relevant Orders   Vitamin B12   Encounter for screening mammogram for malignant neoplasm of breast       Mammogram ordered today and provided number to  call for scheduling.   Relevant Orders   MM 3D SCREEN BREAST BILATERAL   Need for Tdap vaccination       Tdap provided in office today.   Relevant Orders   Tdap vaccine greater than or equal to 7yo IM   Encounter to establish care            Outpatient Encounter Medications as of 11/11/2021  Medication Sig   Abatacept (ORENCIA) 125 MG/ML SOSY Inject into the skin.   brimonidine (ALPHAGAN) 0.2 % ophthalmic solution Place 1 drop into the left eye in the morning and at bedtime.    busPIRone (BUSPAR) 5 MG tablet Take 2 tablets (10 mg total) by mouth 2 (two) times daily as needed.   diltiazem (CARDIZEM) 30 MG tablet TAKE ONE TABLET BY MOUTH THREE TIMES DAILY MAY REDUCE TO 1/2 TAB AS PREFERRED DAY BEFORE HEMODIALYSIS. SKIP DOSE MORNING OF HD   dorzolamidel-timolol (COSOPT) 22.3-6.8 MG/ML SOLN ophthalmic solution Place 1 drop into the left eye 2 (two) times daily.    gentamicin cream (GARAMYCIN) 0.1 % APPLY TOPICALLY TO LEFT FOOT TWICE A DAY   glucose blood test strip Use to check blood sugar 3 times daily, fasting in morning with goal <130 and 2 hours after meals with goal <180.  Bring blood sugar log to visits.   heparin 1000 unit/ml SOLN injection Inject into the peritoneum as needed. Three times per week before dialysis   hydrALAZINE (APRESOLINE) 10 MG tablet Take 1 tablet (10 mg total) by mouth 2 (two) times daily.   insulin glargine (LANTUS) 100 UNIT/ML injection INJECT 10 UNITS SUBCUTANEOUSLY ONCE A DAY   Insulin Pen Needle 31G X 8 MM MISC Inject insulin 3 times a day as directed   ipratropium (ATROVENT) 0.06 % nasal spray INHALE 2 SPRAYS IN EACH NOSTRIL 4 TIMES A DAY   ketoconazole (NIZORAL) 2 % cream APPLY  TOPICALLY ONTO THE SKIN (BOTH FEET) TWICE A DAY AS NEEDED   latanoprost (XALATAN) 0.005 % ophthalmic solution Place 1 drop into the left eye daily.   levocetirizine (XYZAL) 5 MG tablet TAKE ONE TABLET BY MOUTH IN THE EVENING   midodrine (PROAMATINE) 10 MG tablet Take 5-10 mg by mouth daily as needed.   omeprazole (PRILOSEC) 40 MG capsule Take 40 mg by mouth daily.   oxyCODONE (OXY IR/ROXICODONE) 5 MG immediate release tablet Take 1 tablet (5 mg total) by mouth every 8 (eight) hours as needed for severe pain. Must last 30  days.   [START ON 11/12/2021] oxyCODONE (OXY IR/ROXICODONE) 5 MG immediate release tablet Take 1 tablet (5 mg total) by mouth every 8 (eight) hours as needed for severe pain. Must last 30 days.   [START ON 12/12/2021] oxyCODONE (OXY IR/ROXICODONE) 5 MG immediate release tablet Take 1 tablet (5 mg total) by mouth every 8 (eight) hours as needed for severe pain. Must last 30 days.   promethazine (PHENERGAN) 12.5 MG tablet Take 1.0 Tablet Oral twice a day prn nausea   promethazine (PHENERGAN) 25 MG tablet Take 25 mg by mouth 2 (two) times daily as needed.   torsemide (DEMADEX) 100 MG tablet Take 1 tablet by mouth daily.   TRUEPLUS INSULIN SYRINGE 29G X 1/2" 0.5 ML MISC USE 4 TIMES A DAY WITH LANTUS & NOVOLOG   [DISCONTINUED] glucose blood (TRUE METRIX BLOOD GLUCOSE TEST) test strip Use as instructed   [DISCONTINUED] HUMALOG KWIKPEN 100 UNIT/ML KwikPen    [DISCONTINUED] NOVOLOG 100 UNIT/ML injection Inject 3-6 Units into the skin 3 (three) times daily before meals.   NOVOLOG 100 UNIT/ML injection Inject 5-10 Units into the skin 3 (three) times daily before meals.   No facility-administered encounter medications on file as of 11/11/2021.    Follow-up: Return in about 6 weeks (around 12/23/2021) for HTN/DIABETES + MOOD and possible pap + tetanus.   Venita Lick, NP

## 2021-11-11 NOTE — Assessment & Plan Note (Signed)
Chronic, ongoing -- followed by GI, continue this collaboration, will get into PT to assess if underlying pelvic floor dysfunction present -- she reports long time stool incontinence.

## 2021-11-11 NOTE — Assessment & Plan Note (Signed)
Refer to depression plan of care. 

## 2021-11-11 NOTE — Assessment & Plan Note (Signed)
Followed by rheumatology and pain management, continue these collaborations.  Recent notes reviewed.  Continue medications as prescribed by them.  Will place PT order today, which may offer benefit.

## 2021-11-11 NOTE — Assessment & Plan Note (Signed)
Followed by rheumatology and pain management, continue these collaborations.  Recent notes reviewed.  Continue medications as prescribed by them.  Will place PT order today, which may offer benefit.  She is aware all opioids must come from pain management.

## 2021-11-11 NOTE — Assessment & Plan Note (Signed)
Recommend ongoing cessation, she quit in August 2022.

## 2021-11-11 NOTE — Assessment & Plan Note (Signed)
Chronic, ongoing with level above goal in office today.  She does run low during dialysis though -- continue Midodrine as needed in dialysis.  Scheduled to see cardiology next week.  Can not take ACE/ARB due to underlying hyperkalemia.  Continue Diltiazem and Torsemide at current doses + add on Hydralazine 10 MG BID (discussed with patient, she has taken this at dialysis before) -- recommend she hold this morning of dialysis days to prevent lows.  Focus on DASH diet at home.  Check BP daily and document for provider visits.  CMP and TSH today.  Return in 6 weeks.

## 2021-11-11 NOTE — Assessment & Plan Note (Signed)
Followed by podiatry, continue this collaboration.

## 2021-11-11 NOTE — Assessment & Plan Note (Signed)
Ongoing issue with poor control, continue Xyzal daily and will get back into ENT for further assessment.

## 2021-11-11 NOTE — Assessment & Plan Note (Signed)
Chronic, ongoing with recent dialysis A1c 8.5% on review of labs she brought.  At this time will continue Lantus as ordered and restart Novolog which she tolerated better then Humalog -- Humalog causes GI issues for her, will work on appeal for this.  Adjust dosing as needed.  Plan on A1c check in January 2023 + urine ALB.  Recommend she check blood sugar 3-4 times a day, may benefit from Freestyle in future.  New supplies sent.  Return in 6 weeks.

## 2021-11-11 NOTE — Assessment & Plan Note (Signed)
Chronic, ongoing without SI/HI.  She does not want maintenance medication.  Would avoid benzos due to her opioid use for pain, discussed at length with her.  At this time will trial Buspar 10 MG BID as needed.  She would benefit from a maintenance regimen in future that is renal dosed, but declines today.  Return in 6 weeks.

## 2021-11-11 NOTE — Patient Instructions (Addendum)
Please call to schedule your mammogram and/or bone density: °[facility]  °Address: [address], [place], [place] [address]  °Phone: [phone]  ° °Mammogram °A mammogram is an X-ray of the breasts. This is done to check for changes that are not normal. This test can look for changes that may be caused by breast cancer or other problems. °Mammograms are regularly done on women beginning at age 57. A man may have a mammogram if he has a lump or swelling in his breast. °Tell a doctor: °About any allergies you have. °If you have breast implants. °If you have had breast disease, biopsy, or surgery. °If you have a family history of breast cancer. °If you are breastfeeding. °Whether you are pregnant or may be pregnant. °What are the risks? °Generally, this is a safe procedure. But problems may occur, including: °Being exposed to radiation. Radiation levels are very low with this test. °The need for more tests. °The results were not read properly. °Trouble finding breast cancer in women with dense breasts. °What happens before the test? °Have this test done about 1-2 weeks after your menstrual period. This is often when your breasts are the least tender. °If you are visiting a new doctor or clinic, have any past mammogram images sent to your new doctor's office. °Wash your breasts and under your arms on the day of the test. °Do not use deodorants, perfumes, lotions, or powders on the day of the test. °Take off any jewelry from your neck. °Wear clothes that you can change into and out of easily. °What happens during the test? ° °You will take off your clothes from the waist up. You will put on a gown. °You will stand in front of the X-ray machine. °Each breast will be placed between two plastic or glass plates. The plates will press down on your breast for a few seconds. Try to relax. This does not cause any harm to your breasts. It may not feel comfortable, but it will be very  brief. °X-rays will be taken from different angles of each breast. °The procedure may vary among doctors and hospitals. °What can I expect after the test? °The mammogram will be read by a specialist (radiologist). °You may need to do parts of the test again. This depends on the quality of the images. °You may go back to your normal activities. °It is up to you to get the results of your test. Ask how to get your results when they are ready. °Summary °A mammogram is an X-ray of the breasts. It looks for changes that may be caused by breast cancer or other problems. °A man may have this test if he has a lump or swelling in his breast. °Before the test, tell your doctor about any breast problems that you have had in the past. °Have this test done about 1-2 weeks after your menstrual period. °Ask when your test results will be ready. Make sure you get your test results. °This information is not intended to replace advice given to you by your health care provider. Make sure you discuss any questions you have with your health care provider. °Document Revised: 08/20/2021 Document Reviewed: 10/07/2020 °Elsevier Patient Education © 2022 Elsevier Inc. ° °

## 2021-11-12 ENCOUNTER — Telehealth: Payer: Self-pay

## 2021-11-12 LAB — LIPID PANEL W/O CHOL/HDL RATIO
Cholesterol, Total: 189 mg/dL (ref 100–199)
HDL: 38 mg/dL — ABNORMAL LOW (ref >39)
LDL Chol Calc (NIH): 125 mg/dL — ABNORMAL HIGH (ref 0–99)
Triglycerides: 142 mg/dL (ref 0–149)
VLDL Cholesterol Cal: 26 mg/dL (ref 5–40)

## 2021-11-12 LAB — COMPREHENSIVE METABOLIC PANEL
ALT: 17 IU/L (ref 0–32)
AST: 16 IU/L (ref 0–40)
Albumin/Globulin Ratio: 1.2 (ref 1.2–2.2)
Albumin: 4.2 g/dL (ref 3.8–4.9)
Alkaline Phosphatase: 83 IU/L (ref 44–121)
BUN/Creatinine Ratio: 9 (ref 9–23)
BUN: 58 mg/dL — ABNORMAL HIGH (ref 6–24)
Bilirubin Total: 0.3 mg/dL (ref 0.0–1.2)
CO2: 23 mmol/L (ref 20–29)
Calcium: 8.6 mg/dL — ABNORMAL LOW (ref 8.7–10.2)
Chloride: 97 mmol/L (ref 96–106)
Creatinine, Ser: 6.21 mg/dL — ABNORMAL HIGH (ref 0.57–1.00)
Globulin, Total: 3.4 g/dL (ref 1.5–4.5)
Glucose: 202 mg/dL — ABNORMAL HIGH (ref 70–99)
Potassium: 6.1 mmol/L — ABNORMAL HIGH (ref 3.5–5.2)
Sodium: 136 mmol/L (ref 134–144)
Total Protein: 7.6 g/dL (ref 6.0–8.5)
eGFR: 7 mL/min/{1.73_m2} — ABNORMAL LOW (ref >59)

## 2021-11-12 LAB — TSH: TSH: 1.28 u[IU]/mL (ref 0.450–4.500)

## 2021-11-12 LAB — MAGNESIUM: Magnesium: 2.1 mg/dL (ref 1.6–2.3)

## 2021-11-12 LAB — VITAMIN B12: Vitamin B-12: 429 pg/mL (ref 232–1245)

## 2021-11-12 NOTE — Progress Notes (Signed)
Contacted via [place] evening [name], your labs have returned: - Kidney function, creatinine and eGFR, continues to show ongoing end stage kdiney disease.  We will continue to monitor with dialysis team.  Potassium remains elevated, which I know your kidney provider monitors closely.   - Vitamin B12 is normal, on lower side normal.  You could try taking Vitamin B12 500 MCG daily which is good for nervous system health. - Thyroid and magnesium labs normal. - Your LDL is above normal and with your diabetes I do recommend starting statin therapy with Rosuvastatin.  Have you tried this before? The LDL is the bad cholesterol. Over time and in combination with inflammation and other factors, this contributes to plaque which in turn may lead to stroke and/or heart attack down the road. Sometimes high LDL is primarily genetic, and people might be eating all the right foods but still have high numbers. Other times, there is room for improvement in one's diet and eating healthier can bring this number down and potentially reduce one's risk of heart attack and/or stroke.   To reduce your LDL, Remember - more fruits and vegetables, more fish, and limit red meat and dairy products. More soy, nuts, beans, barley, lentils, oats and plant sterol ester enriched margarine instead of butter. I also encourage eliminating sugar and processed food. Remember, shop on the outside of the grocery store and visit your [employer]. If you would like to talk with me about dietary changes plus or minus medications for your cholesterol, please let me know. We should recheck your cholesterol in 3 months.  Any questions? Keep being amazing!!  Thank you for allowing me to participate in your care.  I appreciate you. Kindest regards, Staphany Ditton

## 2021-11-12 NOTE — Chronic Care Management (AMB) (Signed)
  Chronic Care Management   Outreach Note  11/12/2021 Name: [name] MRN: [id number] DOB: 1966/02/28  [name] is a 55 y.o. year old female who is a primary care patient of Cannady, Barbaraann Faster, NP. I reached out to [name] by phone today in response to a referral sent by Ms. [name] Nevitt's primary care provider.  An unsuccessful telephone outreach was attempted today. The patient was referred to the case management team for assistance with care management and care coordination.   Follow Up Plan: A HIPAA compliant phone message was left for the patient providing contact information and requesting a return call.  The care management team will reach out to the patient again over the next 7 days.  If patient returns call to provider office, please advise to call [facility] [place], [address], [place], [place] [address] Direct Dial: [phone] [email or web address] Website: [email or web address]

## 2021-11-12 NOTE — Telephone Encounter (Signed)
Unsure if pt is [place] or [place].

## 2021-11-12 NOTE — Telephone Encounter (Signed)
Requested medication (s) are due for refill today Last ordered 08/15/21 with 2 refills  Requested medication (s) are on the active medication list Yes  Future visit scheduled   Yes for 12/30/21. Lov 11/11/21.   Note to clinic-Last ordered by other physician.   Requested Prescriptions  Pending Prescriptions Disp Refills   insulin glargine (LANTUS) 100 UNIT/ML injection 10 mL 2    Sig: INJECT 10 UNITS SUBCUTANEOUSLY ONCE A DAY     Endocrinology:  Diabetes - Insulins Failed - 11/11/2021  4:33 PM      Failed - HBA1C is between 0 and 7.9 and within 180 days    Hemoglobin A1C  Date Value Ref Range Status  07/20/2021 8.2  Final    Comment:    [facility]          Passed - Valid encounter within last 6 months    Recent Outpatient Visits           Yesterday Type 2 diabetes mellitus with ESRD (end-stage renal disease) ([place])   [facility] Cannady, Jolene T, NP   2 months ago Type 2 diabetes mellitus with ESRD (end-stage renal disease) ([place])   [facility] Olin Hauser, DO   8 months ago Type 2 diabetes mellitus with ESRD (end-stage renal disease) ([place])   [facility] Olin Hauser, DO   1 year ago Type 2 diabetes mellitus with ESRD (end-stage renal disease) ([place])   [facility] Olin Hauser, DO   1 year ago Type 2 diabetes mellitus with ESRD (end-stage renal disease) ([place])   [facility]iter, [name], DO       Future Appointments             In 1 week [name], MD [place]   In 1 month [place], Barbaraann Faster, NP [employer]

## 2021-11-18 ENCOUNTER — Telehealth: Payer: Self-pay | Admitting: Nurse Practitioner

## 2021-11-18 NOTE — Telephone Encounter (Signed)
Prior Authorization was initiated via CoverMyMeds for prescription Novolog. Awaiting determination from patient's insurance company.   KEY: [id number]

## 2021-11-18 NOTE — Telephone Encounter (Signed)
[employer] called checking on status of prior authorization for pt NOVOLOG 100 UNIT/ML injection Caller is in need of additional information.   Caller will fax clinical questions to [phone]

## 2021-11-18 NOTE — Telephone Encounter (Signed)
Noted  

## 2021-11-19 ENCOUNTER — Encounter: Payer: Self-pay | Admitting: Nurse Practitioner

## 2021-11-19 NOTE — Telephone Encounter (Signed)
[name] from [place] called in to get a status update on this PA.   Please advice.

## 2021-11-19 NOTE — Telephone Encounter (Signed)
Paperwork was completed, signed by provider and faxed back to [employer]. Awaiting determination.

## 2021-11-21 ENCOUNTER — Ambulatory Visit: Payer: 59

## 2021-11-21 NOTE — Chronic Care Management (AMB) (Signed)
  Chronic Care Management   Outreach Note  11/21/2021 Name: [name] MRN: [id number] DOB: October 05, 1966  [name] is a 55 y.o. year old female who is a primary care patient of Cannady, Barbaraann Faster, NP. I reached out to [name] by phone today in response to a referral sent by Ms. [name] Wandell's primary care provider.  A second unsuccessful telephone outreach was attempted today. The patient was referred to the case management team for assistance with care management and care coordination.   Follow Up Plan: A HIPAA compliant phone message was left for the patient providing contact information and requesting a return call.  The care management team will reach out to the patient again over the next 7 days.  If patient returns call to provider office, please advise to call [facility] [place], [address], [place], [place] [address] Direct Dial: [phone] [email or web address] Website: [email or web address]

## 2021-11-25 ENCOUNTER — Encounter: Payer: Self-pay | Admitting: Gastroenterology

## 2021-11-25 ENCOUNTER — Other Ambulatory Visit: Payer: Self-pay

## 2021-11-25 ENCOUNTER — Ambulatory Visit (INDEPENDENT_AMBULATORY_CARE_PROVIDER_SITE_OTHER): Payer: 59 | Admitting: Gastroenterology

## 2021-11-25 ENCOUNTER — Ambulatory Visit: Payer: 59 | Admitting: Family Medicine

## 2021-11-25 VITALS — BP 148/69 | HR 65 | Ht 67.0 in | Wt 240.0 lb

## 2021-11-25 DIAGNOSIS — R1319 Other dysphagia: Secondary | ICD-10-CM

## 2021-11-25 DIAGNOSIS — R1310 Dysphagia, unspecified: Secondary | ICD-10-CM

## 2021-11-25 NOTE — Progress Notes (Signed)
Primary Care Physician: Venita Lick, NP  Primary Gastroenterologist:  Dr. [name]  Chief Complaint  Patient presents with   Abdominal Pain    HPI: [name] is a 55 y.o. female here for esophageal spasms. The patient reports that she has episodes of pains in her mid chest that are associated with eating.  She also has noticed that she has had dysphagia with inability to get the food down and less she drinks something is warm.  There is no report of any unexplained weight loss and the patient actually states that she is gaining weight.  The patient denies any fevers chills nausea or vomiting.  The patient has a history of hepatitis C with a HCV FibroSURE showing F2 fibrosis Without any sign of cirrhosis.  Past Medical History:  Diagnosis Date   Allergy    Anemia    Anxiety    worse when not at home - bowel incontinence   Arthritis    Ataxia    COPD (chronic obstructive pulmonary disease) ([place])    Depression    Diabetes mellitus with complication ([place])    Dialysis patient ([place])    ESRD on hemodialysis ([place])    MWF dialysis   Fistula    left upper arm   Hepatitis 2003   Hep C   Hiatal hernia    Hypercholesterolemia    Hypertension    Migraine    Neuropathy, diabetic (HCC)    lower legs   Non-alcoholic cirrhosis (HCC)    Peripheral vascular disease (HCC)    Pneumonia 2015   Psoriasis    Renal insufficiency    Tobacco dependence    Wears dentures    full upper, partial lower    Current Outpatient Medications  Medication Sig Dispense Refill   Abatacept (ORENCIA) 125 MG/ML SOSY Inject into the skin.     brimonidine (ALPHAGAN) 0.2 % ophthalmic solution Place 1 drop into the left eye in the morning and at bedtime.      busPIRone (BUSPAR) 5 MG tablet Take 2 tablets (10 mg total) by mouth 2 (two) times daily as needed. 180 tablet 4   diltiazem (CARDIZEM) 30 MG tablet TAKE ONE TABLET BY MOUTH THREE TIMES DAILY MAY REDUCE TO 1/2 TAB AS PREFERRED DAY BEFORE  HEMODIALYSIS. SKIP DOSE MORNING OF HD 270 tablet 1   dorzolamidel-timolol (COSOPT) 22.3-6.8 MG/ML SOLN ophthalmic solution Place 1 drop into the left eye 2 (two) times daily.      gentamicin cream (GARAMYCIN) 0.1 % APPLY TOPICALLY TO LEFT FOOT TWICE A DAY 30 g 1   glucose blood test strip Use to check blood sugar 3 times daily, fasting in morning with goal <130 and 2 hours after meals with goal <180.  Bring blood sugar log to visits. 100 each 12   heparin 1000 unit/ml SOLN injection Inject into the peritoneum as needed. Three times per week before dialysis     HUMALOG KWIKPEN 100 UNIT/ML KwikPen SMARTSIG:2 Unit(s) SUB-Q 3 Times Daily     hydrALAZINE (APRESOLINE) 10 MG tablet Take 1 tablet (10 mg total) by mouth 2 (two) times daily. 180 tablet 4   insulin glargine (LANTUS) 100 UNIT/ML injection INJECT 10 UNITS SUBCUTANEOUSLY ONCE A DAY 10 mL 2   Insulin Pen Needle 31G X 8 MM MISC Inject insulin 3 times a day as directed 300 each 3   ipratropium (ATROVENT) 0.06 % nasal spray INHALE 2 SPRAYS IN EACH NOSTRIL 4 TIMES A DAY 15 mL 5  ketoconazole (NIZORAL) 2 % cream APPLY TOPICALLY ONTO THE SKIN (BOTH FEET) TWICE A DAY AS NEEDED 30 g 5   latanoprost (XALATAN) 0.005 % ophthalmic solution Place 1 drop into the left eye daily.     levocetirizine (XYZAL) 5 MG tablet TAKE ONE TABLET BY MOUTH IN THE EVENING 90 tablet 3   metoprolol tartrate (LOPRESSOR) 25 MG tablet Take 25 mg by mouth 2 (two) times daily.     midodrine (PROAMATINE) 10 MG tablet Take 5-10 mg by mouth daily as needed.     NOVOLOG 100 UNIT/ML injection Inject 5-10 Units into the skin 3 (three) times daily before meals. 10 mL 11   omeprazole (PRILOSEC) 40 MG capsule Take 40 mg by mouth daily.     oxyCODONE (OXY IR/ROXICODONE) 5 MG immediate release tablet Take 1 tablet (5 mg total) by mouth every 8 (eight) hours as needed for severe pain. Must last 30 days. 75 tablet 0   [START ON 12/12/2021] oxyCODONE (OXY IR/ROXICODONE) 5 MG immediate release  tablet Take 1 tablet (5 mg total) by mouth every 8 (eight) hours as needed for severe pain. Must last 30 days. 75 tablet 0   promethazine (PHENERGAN) 12.5 MG tablet Take 1.0 Tablet Oral twice a day prn nausea     promethazine (PHENERGAN) 25 MG tablet Take 25 mg by mouth 2 (two) times daily as needed.     torsemide (DEMADEX) 100 MG tablet Take 1 tablet by mouth daily.     TRUEPLUS INSULIN SYRINGE 29G X 1/2" 0.5 ML MISC USE 4 TIMES A DAY WITH LANTUS & NOVOLOG 100 each 1   oxyCODONE (OXY IR/ROXICODONE) 5 MG immediate release tablet Take 1 tablet (5 mg total) by mouth every 8 (eight) hours as needed for severe pain. Must last 30 days. 75 tablet 0   No current facility-administered medications for this visit.    Allergies as of 11/25/2021 - Review Complete 11/25/2021  Allergen Reaction Noted   Cinnamon Anaphylaxis 53/29/9242   Garlic Anaphylaxis and Hives 11/28/2014   Onion Hives and Swelling 11/28/2014   Tylenol [acetaminophen] Anaphylaxis 07/25/2015   Losartan potassium  11/18/2021   Ciprofloxacin Diarrhea 11/05/2016   Ginger  09/03/2020   Prednisone Other (See Comments) 01/05/2017    ROS:  General: Negative for anorexia, weight loss, fever, chills, fatigue, weakness. ENT: Negative for hoarseness, difficulty swallowing , nasal congestion. CV: Negative for chest pain, angina, palpitations, dyspnea on exertion, peripheral edema.  Respiratory: Negative for dyspnea at rest, dyspnea on exertion, cough, sputum, wheezing.  GI: See history of present illness. GU:  Negative for dysuria, hematuria, urinary incontinence, urinary frequency, nocturnal urination.  Endo: Negative for unusual weight change.    Physical Examination:   BP (!) 148/69 (BP Location: Right Arm, Patient Position: Sitting, Cuff Size: Large)   Pulse 65   Ht 5\' 7"  (1.702 m)   Wt 240 lb (108.9 kg)   BMI 37.59 kg/m   General: Well-nourished, well-developed in no acute distress.  Eyes: No icterus. Conjunctivae  pink. Neuro: Alert and oriented x 3.  Grossly intact. Skin: Warm and dry, no jaundice.   Psych: Alert and cooperative, normal mood and affect.  Labs:    Imaging Studies: No results found.  Assessment and Plan:   [name] is a 55 y.o. y/o female who comes in today with a history of esophageal spasms seen on previous imaging.  The patient now has some dysphagia.  The patient will be set up for an upper endoscopy to rule  out any obstructing lesions or strictures. It was suggested that the patient may have cirrhosis due to her history of hepatitis C but her HCV FibroSURE Showed F2 with the likelihood of progression of this since that was done in 2020.The patient has been explained the plan and agrees with it.     [name], MD. [facility]    Note: This dictation was prepared with Dragon dictation along with smaller phrase technology. Any transcriptional errors that result from this process are unintentional.

## 2021-11-25 NOTE — H&P (View-Only) (Signed)
Primary Care Physician: Venita Lick, NP  Primary Gastroenterologist:  Dr. [name]  Chief Complaint  Patient presents with   Abdominal Pain    HPI: [name] is a 55 y.o. female here for esophageal spasms. The patient reports that she has episodes of pains in her mid chest that are associated with eating.  She also has noticed that she has had dysphagia with inability to get the food down and less she drinks something is warm.  There is no report of any unexplained weight loss and the patient actually states that she is gaining weight.  The patient denies any fevers chills nausea or vomiting.  The patient has a history of hepatitis C with a HCV FibroSURE showing F2 fibrosis Without any sign of cirrhosis.  Past Medical History:  Diagnosis Date   Allergy    Anemia    Anxiety    worse when not at home - bowel incontinence   Arthritis    Ataxia    COPD (chronic obstructive pulmonary disease) ([place])    Depression    Diabetes mellitus with complication ([place])    Dialysis patient ([place])    ESRD on hemodialysis ([place])    MWF dialysis   Fistula    left upper arm   Hepatitis 2003   Hep C   Hiatal hernia    Hypercholesterolemia    Hypertension    Migraine    Neuropathy, diabetic (H[facility])    lower legs   Non-alcoholic cirrhosis (H[facility])    Peripheral vascular disease (H[facility])    Pneumonia 2015   Psoriasis    Renal insufficiency    Tobacco dependence    Wears dentures    full upper, partial lower    Current Outpatient Medications  Medication Sig Dispense Refill   Abatacept (ORENCIA) 125 MG/ML SOSY Inject into the skin.     brimonidine (ALPHAGAN) 0.2 % ophthalmic solution Place 1 drop into the left eye in the morning and at bedtime.      busPIRone (BUSPAR) 5 MG tablet Take 2 tablets (10 mg total) by mouth 2 (two) times daily as needed. 180 tablet 4   diltiazem (CARDIZEM) 30 MG tablet TAKE ONE TABLET BY MOUTH THREE TIMES DAILY MAY REDUCE TO 1/2  TAB AS PREFERRED DAY BEFORE HEMODIALYSIS. SKIP DOSE MORNING OF HD 270 tablet 1   dorzolamidel-timolol (COSOPT) 22.3-6.8 MG/ML SOLN ophthalmic solution Place 1 drop into the left eye 2 (two) times daily.      gentamicin cream (GARAMYCIN) 0.1 % APPLY TOPICALLY TO LEFT FOOT TWICE A DAY 30 g 1   glucose blood test strip Use to check blood sugar 3 times daily, fasting in morning with goal <130 and 2 hours after meals with goal <180.  Bring blood sugar log to visits. 100 each 12   heparin 1000 unit/ml SOLN injection Inject into the peritoneum as needed. Three times per week before dialysis     HUMALOG KWIKPEN 100 UNIT/ML KwikPen SMARTSIG:2 Unit(s) SUB-Q 3 Times Daily     hydrALAZINE (APRESOLINE) 10 MG tablet Take 1 tablet (10 mg total) by mouth 2 (two) times daily. 180 tablet 4   insulin glargine (LANTUS) 100 UNIT/ML injection INJECT 10 UNITS SUBCUTANEOUSLY ONCE A DAY 10 mL 2   Insulin Pen Needle 31G X 8 MM MISC Inject insulin 3 times a day as directed 300 each 3   ipratropium (ATROVENT) 0.06 % nasal spray INHALE 2 SPRAYS IN EACH NOSTRIL 4 TIMES A DAY 15 mL 5  ketoconazole (NIZORAL) 2 % cream APPLY TOPICALLY ONTO THE SKIN (BOTH FEET) TWICE A DAY AS NEEDED 30 g 5   latanoprost (XALATAN) 0.005 % ophthalmic solution Place 1 drop into the left eye daily.     levocetirizine (XYZAL) 5 MG tablet TAKE ONE TABLET BY MOUTH IN THE EVENING 90 tablet 3   metoprolol tartrate (LOPRESSOR) 25 MG tablet Take 25 mg by mouth 2 (two) times daily.     midodrine (PROAMATINE) 10 MG tablet Take 5-10 mg by mouth daily as needed.     NOVOLOG 100 UNIT/ML injection Inject 5-10 Units into the skin 3 (three) times daily before meals. 10 mL 11   omeprazole (PRILOSEC) 40 MG capsule Take 40 mg by mouth daily.     oxyCODONE (OXY IR/ROXICODONE) 5 MG immediate release tablet Take 1 tablet (5 mg total) by mouth every 8 (eight) hours as needed for severe pain. Must last 30 days. 75 tablet 0   [START ON 12/12/2021] oxyCODONE  (OXY IR/ROXICODONE) 5 MG immediate release tablet Take 1 tablet (5 mg total) by mouth every 8 (eight) hours as needed for severe pain. Must last 30 days. 75 tablet 0   promethazine (PHENERGAN) 12.5 MG tablet Take 1.0 Tablet Oral twice a day prn nausea     promethazine (PHENERGAN) 25 MG tablet Take 25 mg by mouth 2 (two) times daily as needed.     torsemide (DEMADEX) 100 MG tablet Take 1 tablet by mouth daily.     TRUEPLUS INSULIN SYRINGE 29G X 1/2" 0.5 ML MISC USE 4 TIMES A DAY WITH LANTUS & NOVOLOG 100 each 1   oxyCODONE (OXY IR/ROXICODONE) 5 MG immediate release tablet Take 1 tablet (5 mg total) by mouth every 8 (eight) hours as needed for severe pain. Must last 30 days. 75 tablet 0   No current facility-administered medications for this visit.    Allergies as of 11/25/2021 - Review Complete 11/25/2021  Allergen Reaction Noted   Cinnamon Anaphylaxis 44/96/7591   Garlic Anaphylaxis and Hives 11/28/2014   Onion Hives and Swelling 11/28/2014   Tylenol [acetaminophen] Anaphylaxis 07/25/2015   Losartan potassium  11/18/2021   Ciprofloxacin Diarrhea 11/05/2016   Ginger  09/03/2020   Prednisone Other (See Comments) 01/05/2017    ROS:  General: Negative for anorexia, weight loss, fever, chills, fatigue, weakness. ENT: Negative for hoarseness, difficulty swallowing , nasal congestion. CV: Negative for chest pain, angina, palpitations, dyspnea on exertion, peripheral edema.  Respiratory: Negative for dyspnea at rest, dyspnea on exertion, cough, sputum, wheezing.  GI: See history of present illness. GU:  Negative for dysuria, hematuria, urinary incontinence, urinary frequency, nocturnal urination.  Endo: Negative for unusual weight change.    Physical Examination:   BP (!) 148/69 (BP Location: Right Arm, Patient Position: Sitting, Cuff Size: Large)    Pulse 65    Ht 5\' 7"  (1.702 m)    Wt 240 lb (108.9 kg)    BMI 37.59 kg/m   General: Well-nourished, well-developed in no acute  distress.  Eyes: No icterus. Conjunctivae pink. Neuro: Alert and oriented x 3.  Grossly intact. Skin: Warm and dry, no jaundice.   Psych: Alert and cooperative, normal mood and affect.  Labs:    Imaging Studies: No results found.  Assessment and Plan:   RAYANNE PADMANABHAN is a 55 y.o. y/o female who comes in today with a history of esophageal spasms seen on previous imaging.  The patient now has some dysphagia.  The patient will be set up for an  upper endoscopy to rule out any obstructing lesions or strictures. It was suggested that the patient may have cirrhosis due to her history of hepatitis C but her HCV FibroSURE Showed F2 with the likelihood of progression of this since that was done in 2020.The patient has been explained the plan and agrees with it.     [name], MD. Marval Regal    Note: This dictation was prepared with Dragon dictation along with smaller phrase technology. Any transcriptional errors that result from this process are unintentional.

## 2021-11-26 NOTE — Chronic Care Management (AMB) (Signed)
  Chronic Care Management   Outreach Note  11/26/2021 Name: MAHALEY SCHWERING MRN: [id number] DOB: 1966-07-21  LIBI CORSO is a 55 y.o. year old female who is a primary care patient of Cannady, Barbaraann Faster, NP. I reached out to [name] by phone today in response to a referral sent by Ms. [name] Toothaker's primary care provider.  Third unsuccessful telephone outreach was attempted today. The patient was referred to the case management team for assistance with care management and care coordination. The patient's primary care provider has been notified of our unsuccessful attempts to make or maintain contact with the patient. The care management team is pleased to engage with this patient at any time in the future should he/she be interested in assistance from the care management team.   Follow Up Plan: We have been unable to make contact with the patient for follow up. The care management team is available to follow up with the patient after provider conversation with the patient regarding recommendation for care management engagement and subsequent re-referral to the care management team.   Noreene Larsson, [place], [place], [place] [address] Direct Dial: [phone] [email or web address]

## 2021-12-04 ENCOUNTER — Other Ambulatory Visit: Payer: Self-pay

## 2021-12-04 ENCOUNTER — Encounter
Admission: RE | Disposition: A | Discharge status: Home / Self Care | Payer: Self-pay | Source: Home / Self Care | Attending: Gastroenterology

## 2021-12-04 ENCOUNTER — Ambulatory Visit: Payer: 59 | Admitting: Anesthesiology

## 2021-12-04 ENCOUNTER — Ambulatory Visit
Admission: RE | Admit: 2021-12-04 | Discharge: 2021-12-04 | Disposition: A | Discharge status: Home / Self Care | Payer: 59 | Attending: Gastroenterology | Admitting: Gastroenterology

## 2021-12-04 ENCOUNTER — Encounter: Payer: Self-pay | Admitting: Gastroenterology

## 2021-12-04 DIAGNOSIS — J449 Chronic obstructive pulmonary disease, unspecified: Secondary | ICD-10-CM | POA: Insufficient documentation

## 2021-12-04 DIAGNOSIS — I120 Hypertensive chronic kidney disease with stage 5 chronic kidney disease or end stage renal disease: Secondary | ICD-10-CM | POA: Diagnosis not present

## 2021-12-04 DIAGNOSIS — E1122 Type 2 diabetes mellitus with diabetic chronic kidney disease: Secondary | ICD-10-CM | POA: Insufficient documentation

## 2021-12-04 DIAGNOSIS — Z794 Long term (current) use of insulin: Secondary | ICD-10-CM | POA: Diagnosis not present

## 2021-12-04 DIAGNOSIS — Z992 Dependence on renal dialysis: Secondary | ICD-10-CM | POA: Diagnosis not present

## 2021-12-04 DIAGNOSIS — K209 Esophagitis, unspecified without bleeding: Secondary | ICD-10-CM | POA: Diagnosis not present

## 2021-12-04 DIAGNOSIS — F17200 Nicotine dependence, unspecified, uncomplicated: Secondary | ICD-10-CM | POA: Insufficient documentation

## 2021-12-04 DIAGNOSIS — Z79899 Other long term (current) drug therapy: Secondary | ICD-10-CM | POA: Insufficient documentation

## 2021-12-04 DIAGNOSIS — R1310 Dysphagia, unspecified: Secondary | ICD-10-CM | POA: Diagnosis present

## 2021-12-04 DIAGNOSIS — K449 Diaphragmatic hernia without obstruction or gangrene: Secondary | ICD-10-CM | POA: Insufficient documentation

## 2021-12-04 DIAGNOSIS — N186 End stage renal disease: Secondary | ICD-10-CM | POA: Diagnosis not present

## 2021-12-04 DIAGNOSIS — E119 Type 2 diabetes mellitus without complications: Secondary | ICD-10-CM | POA: Diagnosis not present

## 2021-12-04 HISTORY — PX: ESOPHAGOGASTRODUODENOSCOPY (EGD) WITH PROPOFOL: SHX5813

## 2021-12-04 LAB — GLUCOSE, CAPILLARY: Glucose-Capillary: 169 mg/dL — ABNORMAL HIGH (ref 70–99)

## 2021-12-04 SURGERY — ESOPHAGOGASTRODUODENOSCOPY (EGD) WITH PROPOFOL
Anesthesia: General

## 2021-12-04 MED ORDER — GLYCOPYRROLATE 0.2 MG/ML IJ SOLN
INTRAMUSCULAR | Status: DC | PRN
Start: 2021-12-04 — End: 2021-12-04
  Administered 2021-12-04: .2 mg via INTRAVENOUS

## 2021-12-04 MED ORDER — PROPOFOL 500 MG/50ML IV EMUL
INTRAVENOUS | Status: AC
  Filled 2021-12-04: qty 150

## 2021-12-04 MED ORDER — PROPOFOL 10 MG/ML IV BOLUS
INTRAVENOUS | Status: DC | PRN
  Administered 2021-12-04: 100 mg via INTRAVENOUS

## 2021-12-04 MED ORDER — LIDOCAINE HCL (PF) 2 % IJ SOLN
INTRAMUSCULAR | Status: AC
  Filled 2021-12-04: qty 5

## 2021-12-04 MED ORDER — PROPOFOL 500 MG/50ML IV EMUL
INTRAVENOUS | Status: DC | PRN
  Administered 2021-12-04: 120 ug/kg/min via INTRAVENOUS

## 2021-12-04 MED ORDER — GLYCOPYRROLATE 0.2 MG/ML IJ SOLN
INTRAMUSCULAR | Status: AC
  Filled 2021-12-04: qty 1

## 2021-12-04 MED ORDER — SODIUM CHLORIDE 0.9 % IV SOLN: INTRAVENOUS | Status: DC

## 2021-12-04 MED ORDER — LIDOCAINE 2% (20 MG/ML) 5 ML SYRINGE
INTRAMUSCULAR | Status: DC | PRN
Start: 2021-12-04 — End: 2021-12-04
  Administered 2021-12-04: 20 mg via INTRAVENOUS

## 2021-12-04 NOTE — Anesthesia Postprocedure Evaluation (Signed)
Anesthesia Post Note  Patient: [name]  Procedure(s) Performed: ESOPHAGOGASTRODUODENOSCOPY (EGD) WITH PROPOFOL  Patient location during evaluation: Endoscopy Anesthesia Type: General Level of consciousness: awake and alert Pain management: pain level controlled Vital Signs Assessment: post-procedure vital signs reviewed and stable Respiratory status: spontaneous breathing, nonlabored ventilation and respiratory function stable Cardiovascular status: blood pressure returned to baseline and stable Postop Assessment: no apparent nausea or vomiting Anesthetic complications: no   No notable events documented.   Last Vitals:  Vitals:   12/04/21 0803 12/04/21 0824  BP:  (!) 106/55  Pulse:    Resp:    Temp: (!) 35.9 C   SpO2:      Last Pain:  Vitals:   12/04/21 0824  TempSrc:   PainSc: 0-No pain                 [place] Ouch

## 2021-12-04 NOTE — Op Note (Signed)
[facility] Gastroenterology Patient Name: [name] Procedure Date: 12/04/2021 7:38 AM MRN: [id number] Account #: [id number] Date of Birth: June 20, 1966 Admit Type: Outpatient Age: 55 Room: [facility] ENDO ROOM 4 Gender: Female Note Status: Finalized Instrument Name: Upper Endoscope [id number] Procedure:             Upper GI endoscopy Indications:           Dysphagia Providers:             [name] MD, MD Referring MD:          Barbaraann Faster. Cannady (Referring MD) Medicines:             Propofol per Anesthesia Complications:         No immediate complications. Procedure:             Pre-Anesthesia Assessment:                        - Prior to the procedure, a History and Physical was                         performed, and patient medications and allergies were                         reviewed. The patient's tolerance of previous                         anesthesia was also reviewed. The risks and benefits                         of the procedure and the sedation options and risks                         were discussed with the patient. All questions were                         answered, and informed consent was obtained. Prior                         Anticoagulants: The patient has taken no previous                         anticoagulant or antiplatelet agents. ASA Grade                         Assessment: II - A patient with mild systemic disease.                         After reviewing the risks and benefits, the patient                         was deemed in satisfactory condition to undergo the                         procedure.                        After obtaining informed consent, the endoscope was  passed under direct vision. Throughout the procedure,                         the patient's blood pressure, pulse, and oxygen                         saturations were monitored continuously. The Endoscope                         was introduced  through the mouth, and advanced to the                         second part of duodenum. The upper GI endoscopy was                         accomplished without difficulty. The patient tolerated                         the procedure well. Findings:      A small hiatal hernia was present.      The stomach was normal.      The examined duodenum was normal.      Two biopsies were obtained with cold forceps for histology in the middle       third of the esophagus.      A TTS dilator was passed through the scope. Dilation with a 15-16.5-18       mm balloon dilator was performed to 18 mm in the entire esophagus. Impression:            - Small hiatal hernia.                        - Normal stomach.                        - Normal examined duodenum.                        - Biopsy performed in the middle third of the                         esophagus.                        - Dilation performed in the entire esophagus. Recommendation:        - Discharge patient to home.                        - Resume previous diet.                        - Continue present medications.                        - Await pathology results. Procedure Code(s):     --- Professional ---                        [id number], Esophagogastroduodenoscopy, flexible,                         transoral; with transendoscopic balloon dilation of  esophagus (less than 30 mm diameter)                        43239, 59, Esophagogastroduodenoscopy, flexible,                         transoral; with biopsy, single or multiple Diagnosis Code(s):     --- Professional ---                        R13.10, Dysphagia, unspecified CPT copyright 2019 [employer] Medical Association. All rights reserved. The codes documented in this report are preliminary and upon coder review may  be revised to meet current compliance requirements. [name] MD, MD 12/04/2021 7:55:23 AM This report has been signed electronically. Number of  Addenda: 0 Note Initiated On: 12/04/2021 7:38 AM Estimated Blood Loss:  Estimated blood loss: none.      [facility]

## 2021-12-04 NOTE — Transfer of Care (Signed)
Immediate Anesthesia Transfer of Care Note  Patient: [name]  Procedure(s) Performed: ESOPHAGOGASTRODUODENOSCOPY (EGD) WITH PROPOFOL  Patient Location: Endoscopy Unit  Anesthesia Type:General  Level of Consciousness: awake  Airway & Oxygen Therapy: Patient Spontanous Breathing and Patient connected to nasal cannula oxygen  Post-op Assessment: Report given to RN and Post -op Vital signs reviewed and stable  Post vital signs: Reviewed  Last Vitals:  Vitals Value Taken Time  BP 101/72 12/04/21 0802  Temp    Pulse 52 12/04/21 0802  Resp 17 12/04/21 0802  SpO2 98 % 12/04/21 0802  Vitals shown include unvalidated device data.  Last Pain:  Vitals:   12/04/21 0758  TempSrc:   PainSc: 0-No pain         Complications: No notable events documented.

## 2021-12-04 NOTE — Interval H&P Note (Signed)
[name], MD [address] [place], [place] [address] [phone] Fax : [phone]  Primary Care Physician:  Venita Lick, NP Primary Gastroenterologist:  Dr. Allen Norris  Pre-Procedure History & Physical: HPI:  [name] is a 55 y.o. female is here for an endoscopy.   Past Medical History:  Diagnosis Date   Allergy    Anemia    Anxiety    worse when not at home - bowel incontinence   Arthritis    Ataxia    COPD (chronic obstructive pulmonary disease) (HCC)    Depression    Diabetes mellitus with complication ([facility] [place])    Dialysis patient ([place])    ESRD on hemodialysis ([place])    MWF dialysis   Fistula    left upper arm   Hepatitis 2003   Hep C   Hiatal hernia    Hypercholesterolemia    Hypertension    Migraine    Neuropathy, diabetic (HCC)    lower legs   Non-alcoholic cirrhosis (HCC)    Peripheral vascular disease ([place])    Pneumonia 2015   Psoriasis    Renal insufficiency    Tobacco dependence    Wears dentures    full upper, partial lower    Past Surgical History:  Procedure Laterality Date   A/V FISTULAGRAM Left 04/23/2020   Procedure: A/V FISTULAGRAM;  Surgeon: Katha Cabal, MD;  Location: [facility];  Service: Cardiovascular;  Laterality: Left;   A/V SHUNT INTERVENTION N/A 12/16/2017   Procedure: A/V SHUNT INTERVENTION;  Surgeon: Algernon Huxley, MD;  Location: [facility];  Service: Cardiovascular;  Laterality: N/A;   AQUEOUS SHUNT Left 12/20/2019   Procedure: AHMED TUBE SHUNT WITH TUTOPLAST AND AC WASHOUT LEFT DIABETIC;  Surgeon: Leandrew Koyanagi, MD;  Location: [facility];  Service: Ophthalmology;  Laterality: Left;  Diabetic - insulin   AV FISTULA PLACEMENT Left 11/2014   CESAREAN SECTION     CHOLECYSTECTOMY     COLONOSCOPY N/A 09/22/2017   Procedure: COLONOSCOPY;  Surgeon: Lin Landsman, MD;  Location: [facility];  Service: Endoscopy;  Laterality: N/A;   COLONOSCOPY WITH  PROPOFOL N/A 04/16/2020   Procedure: COLONOSCOPY WITH PROPOFOL;  Surgeon: [name], MD;  Location: [facility];  Service: Endoscopy;  Laterality: N/A;  Priority 4   cyst removed  from left hand Left 1989   DIALYSIS/PERMA CATHETER INSERTION N/A 05/20/2017   Procedure: Dialysis/Perma Catheter Insertion and fistulagram/LUE angiogram;  Surgeon: Algernon Huxley, MD;  Location: [facility];  Service: Cardiovascular;  Laterality: N/A;   ESOPHAGOGASTRODUODENOSCOPY N/A 09/22/2017   Procedure: ESOPHAGOGASTRODUODENOSCOPY (EGD);  Surgeon: Lin Landsman, MD;  Location: [facility] [address];  Service: Endoscopy;  Laterality: N/A;   INCISION AND DRAINAGE ABSCESS N/A 07/16/2016   Procedure: INCISION AND DRAINAGE ABSCESS;  Surgeon: Carloyn Manner, MD;  Location: [facility];  Service: ENT;  Laterality: N/A;   PERIPHERAL VASCULAR CATHETERIZATION N/A 07/25/2015   Procedure: A/V Shuntogram/Fistulagram;  Surgeon: Algernon Huxley, MD;  Location: [facility];  Service: Cardiovascular;  Laterality: N/A;   PERIPHERAL VASCULAR CATHETERIZATION Left 07/25/2015   Procedure: A/V Shunt Intervention;  Surgeon: Algernon Huxley, MD;  Location: [facility];  Service: Cardiovascular;  Laterality: Left;   PERIPHERAL VASCULAR CATHETERIZATION Left 10/07/2015   Procedure: A/V Shuntogram/Fistulagram;  Surgeon: Algernon Huxley, MD;  Location: [facility];  Service: Cardiovascular;  Laterality: Left;   PERIPHERAL VASCULAR CATHETERIZATION N/A 10/07/2015   Procedure:  A/V Shunt Intervention;  Surgeon: Algernon Huxley, MD;  Location: [facility];  Service: Cardiovascular;  Laterality: N/A;   PERIPHERAL VASCULAR CATHETERIZATION  10/07/2015   Procedure: Dialysis/Perma Catheter Insertion;  Surgeon: Algernon Huxley, MD;  Location: [facility];  Service: Cardiovascular;;   PERIPHERAL VASCULAR CATHETERIZATION N/A 12/17/2015   Procedure: Dialysis/Perma Catheter Removal;  Surgeon: Katha Cabal, MD;   Location: [facility];  Service: Cardiovascular;  Laterality: N/A;   PERIPHERAL VASCULAR CATHETERIZATION N/A 01/11/2017   Procedure: Visceral Angiography;  Surgeon: Algernon Huxley, MD;  Location: [facility];  Service: Cardiovascular;  Laterality: N/A;   PERIPHERAL VASCULAR CATHETERIZATION N/A 01/11/2017   Procedure: Visceral Artery Intervention;  Surgeon: Algernon Huxley, MD;  Location: [facility] [place] [facility];  Service: Cardiovascular;  Laterality: N/A;   POLYPECTOMY  09/22/2017   Procedure: POLYPECTOMY;  Surgeon: Lin Landsman, MD;  Location: [facility];  Service: Endoscopy;;   rt. tubal and ovary removed     STENT PLACEMENT VASCULAR ([place] HX) Left 03/2018   Performed at [facility] using EV3 protg GPS stent graph [id number] lot [id number]   TONSILLECTOMY     UPPER EXTREMITY ANGIOGRAPHY Left 09/29/2019   Procedure: UPPER EXTREMITY ANGIOGRAPHY;  Surgeon: Katha Cabal, MD;  Location: [facility];  Service: Cardiovascular;  Laterality: Left;    Prior to Admission medications   Medication Sig Start Date End Date Taking? Authorizing Provider  Abatacept (ORENCIA) 125 MG/ML SOSY Inject into the skin.   Yes [provider]  brimonidine (ALPHAGAN) 0.2 % ophthalmic solution Place 1 drop into the left eye in the morning and at bedtime.    Yes [provider]  diltiazem (CARDIZEM) 30 MG tablet TAKE ONE TABLET BY MOUTH THREE TIMES DAILY MAY REDUCE TO 1/2 TAB AS PREFERRED DAY BEFORE HEMODIALYSIS. SKIP DOSE MORNING OF HD 08/15/21  Yes Karamalegos, [name], DO  dorzolamidel-timolol (COSOPT) 22.3-6.8 MG/ML SOLN ophthalmic solution Place 1 drop into the left eye 2 (two) times daily.    Yes [provider]  gentamicin cream (GARAMYCIN) 0.1 % APPLY TOPICALLY TO LEFT FOOT TWICE A DAY 10/13/21  Yes Edrick Kins, DPM  heparin 1000 unit/ml SOLN injection Inject into the peritoneum as needed. Three times per week before  dialysis   Yes [provider]  insulin glargine (LANTUS) 100 UNIT/ML injection INJECT 10 UNITS SUBCUTANEOUSLY ONCE A DAY 11/12/21  Yes Cannady, Jolene T, NP  latanoprost (XALATAN) 0.005 % ophthalmic solution Place 1 drop into the left eye daily. 10/18/21  Yes [provider]  levocetirizine (XYZAL) 5 MG tablet TAKE ONE TABLET BY MOUTH IN THE EVENING 10/13/21  Yes Karamalegos, [name], DO  midodrine (PROAMATINE) 10 MG tablet Take 5-10 mg by mouth daily as needed. 06/28/20  Yes [provider]  NOVOLOG 100 UNIT/ML injection Inject 5-10 Units into the skin 3 (three) times daily before meals. 11/11/21  Yes Cannady, Jolene T, NP  omeprazole (PRILOSEC) 40 MG capsule Take 40 mg by mouth daily. 04/27/19  Yes [provider]  promethazine (PHENERGAN) 12.5 MG tablet Take 1.0 Tablet Oral twice a day prn nausea 03/03/21  Yes [provider]  promethazine (PHENERGAN) 25 MG tablet Take 25 mg by mouth 2 (two) times daily as needed. 11/07/21  Yes [provider]  busPIRone (BUSPAR) 5 MG tablet Take 2 tablets (10 mg total) by mouth 2 (two) times daily as needed. 11/11/21   Venita Lick, NP  glucose blood test strip Use to check blood sugar 3 times daily, fasting in morning with goal <130 and 2 hours after meals with goal <180.  Bring blood sugar log to visits. 11/11/21   Marnee Guarneri T, NP  HUMALOG KWIKPEN 100 UNIT/ML KwikPen SMARTSIG:2 Unit(s) SUB-Q 3 Times Daily Patient not taking: Reported on 12/04/2021 11/11/21   [provider]  hydrALAZINE (APRESOLINE) 10 MG tablet Take 1 tablet (10 mg total) by mouth 2 (two) times daily. Patient not taking: Reported on 12/04/2021 11/11/21   Marnee Guarneri T, NP  Insulin Pen Needle 31G X 8 MM MISC Inject insulin 3 times a day as directed 07/17/21   Olin Hauser, DO  ipratropium (ATROVENT) 0.06 % nasal spray INHALE 2 SPRAYS IN EACH NOSTRIL 4 TIMES A DAY Patient not taking: Reported on 12/04/2021  07/14/21   Olin Hauser, DO  ketoconazole (NIZORAL) 2 % cream APPLY TOPICALLY ONTO THE SKIN (BOTH FEET) TWICE A DAY AS NEEDED 07/14/21   Parks Ranger, [name], DO  metoprolol tartrate (LOPRESSOR) 25 MG tablet Take 25 mg by mouth 2 (two) times daily. 11/18/21   [provider]  oxyCODONE (OXY IR/ROXICODONE) 5 MG immediate release tablet Take 1 tablet (5 mg total) by mouth every 8 (eight) hours as needed for severe pain. Must last 30 days. 10/13/21 11/12/21  Gillis Santa, MD  oxyCODONE (OXY IR/ROXICODONE) 5 MG immediate release tablet Take 1 tablet (5 mg total) by mouth every 8 (eight) hours as needed for severe pain. Must last 30 days. 11/12/21 12/12/21  Gillis Santa, MD  oxyCODONE (OXY IR/ROXICODONE) 5 MG immediate release tablet Take 1 tablet (5 mg total) by mouth every 8 (eight) hours as needed for severe pain. Must last 30 days. 12/12/21 01/11/22  Gillis Santa, MD  torsemide (DEMADEX) 100 MG tablet Take 1 tablet by mouth daily. 01/27/21   [provider]  TRUEPLUS INSULIN SYRINGE 29G X 1/2" 0.5 ML MISC USE 4 TIMES A DAY WITH LANTUS & NOVOLOG 10/13/21   Olin Hauser, DO    Allergies as of 11/26/2021 - Review Complete 11/25/2021  Allergen Reaction Noted   Cinnamon Anaphylaxis 00/93/8182   Garlic Anaphylaxis and Hives 11/28/2014   Onion Hives and Swelling 11/28/2014   Tylenol [acetaminophen] Anaphylaxis 07/25/2015   Losartan potassium  11/18/2021   Ciprofloxacin Diarrhea 11/05/2016   Ginger  09/03/2020   Prednisone Other (See Comments) 01/05/2017    Family History  Problem Relation Age of Onset   Hypertension Mother    Heart disease Mother    Hypertension Father    Skin cancer Father     Social History   Socioeconomic History   Marital status: Legally Separated    Spouse name: Not on file   Number of children: Not on file   Years of education: Not on file   Highest education level: Not on file  Occupational History   Occupation:  disabled  Tobacco Use   Smoking status: Former    Packs/day: 0.25    Years: 20.00    Pack years: 5.00    Types: Cigarettes    Quit date: 08/13/2021    Years since quitting: 0.3   Smokeless tobacco: Never  Vaping Use   Vaping Use: Never used  Substance and Sexual Activity   Alcohol use: No   Drug use: No   Sexual activity: Not on file  Other Topics Concern   Not on file  Social History Narrative   Patient lives at an extended stay hotel  currently.     Social Determinants of Health   Financial Resource Strain: Low Risk    Difficulty of Paying Living Expenses: Not hard at all  Food Insecurity: No Food Insecurity   Worried About Charity fundraiser in the Last Year: Never true   Arboriculturist in the Last Year: Never true  Transportation Needs: Unmet Transportation Needs   Lack of Transportation (Medical): Yes   Lack of Transportation (Non-Medical): No  Physical Activity: Inactive   Days of Exercise per Week: 0 days   Minutes of Exercise per Session: 0 min  Stress: No Stress Concern Present   Feeling of Stress : Only a little  Social Connections: Moderately Isolated   Frequency of Communication with Friends and Family: More than three times a week   Frequency of Social Gatherings with Friends and Family: More than three times a week   Attends Religious Services: Never   Marine scientist or Organizations: No   Attends Music therapist: Never   Marital Status: Living with partner  Intimate Partner Violence: Not on file    Review of Systems: See HPI, otherwise negative ROS  Physical Exam: BP (!) 180/78    Pulse 69    Temp (!) 96.3 F (35.7 C) (Temporal)    Resp 16    Ht 5\' 7"  (1.702 m)    Wt 108 kg    SpO2 100%    BMI 37.28 kg/m  General:   Alert,  pleasant and cooperative in NAD Head:  Normocephalic and atraumatic. Neck:  Supple; no masses or thyromegaly. Lungs:  Clear throughout to auscultation.    Heart:  Regular rate and rhythm. Abdomen:   Soft, nontender and nondistended. Normal bowel sounds, without guarding, and without rebound.   Neurologic:  Alert and  oriented x4;  grossly normal neurologically.  Impression/Plan: [name] is here for an endoscopy to be performed for dysphagia  Risks, benefits, limitations, and alternatives regarding  endoscopy have been reviewed with the patient.  Questions have been answered.  All parties agreeable.   [name], MD  12/04/2021, 7:36 AM

## 2021-12-04 NOTE — Anesthesia Preprocedure Evaluation (Addendum)
Anesthesia Evaluation  Patient identified by MRN, date of birth, ID band Patient awake    Reviewed: Allergy & Precautions, NPO status , Patient's Chart, lab work & pertinent test results  History of Anesthesia Complications Negative for: history of anesthetic complications  Airway Mallampati: III  TM Distance: >3 FB Neck ROM: Full    Dental  (+) Edentulous Upper,    Pulmonary COPD,  COPD inhaler, Current Smoker and Patient abstained from smoking., former smoker,    breath sounds clear to auscultation- rhonchi (-) wheezing      Cardiovascular Exercise Tolerance: Poor hypertension, Pt. on medications (-) CAD, (-) Past MI, (-) Cardiac Stents and (-) CABG  Rhythm:Regular Rate:Normal - Systolic murmurs and - Diastolic murmurs    Neuro/Psych  Headaches, neg Seizures PSYCHIATRIC DISORDERS Anxiety Depression    GI/Hepatic hiatal hernia, (+) Hepatitis -, Cdysphagia   Endo/Other  diabetes, Insulin Dependent  Renal/GU ESRF and DialysisRenal disease     Musculoskeletal  (+) Arthritis ,   Abdominal (+) + obese,   Peds  Hematology  (+) anemia ,   Anesthesia Other Findings Past Medical History: No date: Allergy No date: Anemia No date: Anxiety     Comment:  worse when not at home - bowel incontinence No date: Arthritis No date: Ataxia No date: COPD (chronic obstructive pulmonary disease) (HCC) No date: Depression No date: Diabetes mellitus with complication (HCC) No date: Dialysis patient [facility]) No date: ESRD on hemodialysis ([place])     Comment:  MWF dialysis No date: Fistula     Comment:  left upper arm 2003: Hepatitis     Comment:  Hep C No date: Hiatal hernia No date: Hypercholesterolemia No date: Hypertension No date: Migraine No date: Neuropathy, diabetic ([place])     Comment:  lower legs No date: Non-alcoholic cirrhosis ([place]) No date: Peripheral vascular disease ([place]) 2015: Pneumonia No date: Psoriasis No date:  Renal insufficiency No date: Tobacco dependence No date: Wears dentures     Comment:  full upper, partial lower   Reproductive/Obstetrics                            Anesthesia Physical  Anesthesia Plan  ASA: 3  Anesthesia Plan: General   Post-op Pain Management:    Induction: Intravenous  PONV Risk Score and Plan: 1 and Propofol infusion  Airway Management Planned: Natural Airway  Additional Equipment:   Intra-op Plan:   Post-operative Plan:   Informed Consent: I have reviewed the patients History and Physical, chart, labs and discussed the procedure including the risks, benefits and alternatives for the proposed anesthesia with the patient or authorized representative who has indicated his/her understanding and acceptance.     Dental advisory given  Plan Discussed with: CRNA and Anesthesiologist  Anesthesia Plan Comments:         Anesthesia Quick Evaluation

## 2021-12-04 NOTE — Interval H&P Note (Signed)
[name], MD [address] [place], [place] [address] [phone] Fax : [phone]  Primary Care Physician:  Venita Lick, NP Primary Gastroenterologist:  Dr. Allen Norris  Pre-Procedure History & Physical: HPI:  [name] is a 55 y.o. female is here for an endoscopy.   Past Medical History:  Diagnosis Date   Allergy    Anemia    Anxiety    worse when not at home - bowel incontinence   Arthritis    Ataxia    COPD (chronic obstructive pulmonary disease) (HCC)    Depression    Diabetes mellitus with complication ([facility])    Dialysis patient ([facility])    ESRD on hemodialysis ([facility])    MWF dialysis   Fistula    left upper arm   Hepatitis 2003   Hep C   Hiatal hernia    Hypercholesterolemia    Hypertension    Migraine    Neuropathy, diabetic (HCC)    lower legs   Non-alcoholic cirrhosis (HCC)    Peripheral vascular disease ([facility])    Pneumonia 2015   Psoriasis    Renal insufficiency    Tobacco dependence    Wears dentures    full upper, partial lower    Past Surgical History:  Procedure Laterality Date   A/V FISTULAGRAM Left 04/23/2020   Procedure: A/V FISTULAGRAM;  Surgeon: Katha Cabal, MD;  Location: [facility];  Service: Cardiovascular;  Laterality: Left;   A/V SHUNT INTERVENTION N/A 12/16/2017   Procedure: A/V SHUNT INTERVENTION;  Surgeon: Algernon Huxley, MD;  Location: [facility];  Service: Cardiovascular;  Laterality: N/A;   AQUEOUS SHUNT Left 12/20/2019   Procedure: AHMED TUBE SHUNT WITH TUTOPLAST AND AC WASHOUT LEFT DIABETIC;  Surgeon: Leandrew Koyanagi, MD;  Location: [facility];  Service: Ophthalmology;  Laterality: Left;  Diabetic - insulin   AV FISTULA PLACEMENT Left 11/2014   CESAREAN SECTION     CHOLECYSTECTOMY     COLONOSCOPY N/A 09/22/2017   Procedure: COLONOSCOPY;  Surgeon: Lin Landsman, MD;  Location: [facility];  Service: Endoscopy;  Laterality: N/A;   COLONOSCOPY WITH  PROPOFOL N/A 04/16/2020   Procedure: COLONOSCOPY WITH PROPOFOL;  Surgeon: [name], MD;  Location: [facility];  Service: Endoscopy;  Laterality: N/A;  Priority 4   cyst removed  from left hand Left 1989   DIALYSIS/PERMA CATHETER INSERTION N/A 05/20/2017   Procedure: Dialysis/Perma Catheter Insertion and fistulagram/LUE angiogram;  Surgeon: Algernon Huxley, MD;  Location: [facility];  Service: Cardiovascular;  Laterality: N/A;   ESOPHAGOGASTRODUODENOSCOPY N/A 09/22/2017   Procedure: ESOPHAGOGASTRODUODENOSCOPY (EGD);  Surgeon: Lin Landsman, MD;  Location: [facility];  Service: Endoscopy;  Laterality: N/A;   INCISION AND DRAINAGE ABSCESS N/A 07/16/2016   Procedure: INCISION AND DRAINAGE ABSCESS;  Surgeon: Carloyn Manner, MD;  Location: [facility];  Service: ENT;  Laterality: N/A;   PERIPHERAL VASCULAR CATHETERIZATION N/A 07/25/2015   Procedure: A/V Shuntogram/Fistulagram;  Surgeon: Algernon Huxley, MD;  Location: [facility];  Service: Cardiovascular;  Laterality: N/A;   PERIPHERAL VASCULAR CATHETERIZATION Left 07/25/2015   Procedure: A/V Shunt Intervention;  Surgeon: Algernon Huxley, MD;  Location: [facility];  Service: Cardiovascular;  Laterality: Left;   PERIPHERAL VASCULAR CATHETERIZATION Left 10/07/2015   Procedure: A/V Shuntogram/Fistulagram;  Surgeon: Algernon Huxley, MD;  Location: [facility];  Service: Cardiovascular;  Laterality: Left;   PERIPHERAL VASCULAR CATHETERIZATION N/A 10/07/2015   Procedure:  A/V Shunt Intervention;  Surgeon: Algernon Huxley, MD;  Location: [facility];  Service: Cardiovascular;  Laterality: N/A;   PERIPHERAL VASCULAR CATHETERIZATION  10/07/2015   Procedure: Dialysis/Perma Catheter Insertion;  Surgeon: Algernon Huxley, MD;  Location: [facility];  Service: Cardiovascular;;   PERIPHERAL VASCULAR CATHETERIZATION N/A 12/17/2015   Procedure: Dialysis/Perma Catheter Removal;  Surgeon: Katha Cabal, MD;   Location: [facility];  Service: Cardiovascular;  Laterality: N/A;   PERIPHERAL VASCULAR CATHETERIZATION N/A 01/11/2017   Procedure: Visceral Angiography;  Surgeon: Algernon Huxley, MD;  Location: [facility];  Service: Cardiovascular;  Laterality: N/A;   PERIPHERAL VASCULAR CATHETERIZATION N/A 01/11/2017   Procedure: Visceral Artery Intervention;  Surgeon: Algernon Huxley, MD;  Location: [facility];  Service: Cardiovascular;  Laterality: N/A;   POLYPECTOMY  09/22/2017   Procedure: POLYPECTOMY;  Surgeon: Lin Landsman, MD;  Location: [facility];  Service: Endoscopy;;   rt. tubal and ovary removed     STENT PLACEMENT VASCULAR ([place] [facility]) Left 03/2018   Performed at [facility] using EV3 protg GPS stent graph [id number] lot [id number]   TONSILLECTOMY     UPPER EXTREMITY ANGIOGRAPHY Left 09/29/2019   Procedure: UPPER EXTREMITY ANGIOGRAPHY;  Surgeon: Katha Cabal, MD;  Location: [facility];  Service: Cardiovascular;  Laterality: Left;    Prior to Admission medications   Medication Sig Start Date End Date Taking? Authorizing Provider  Abatacept (ORENCIA) 125 MG/ML SOSY Inject into the skin.   Yes [provider]  brimonidine (ALPHAGAN) 0.2 % ophthalmic solution Place 1 drop into the left eye in the morning and at bedtime.    Yes [provider]  diltiazem (CARDIZEM) 30 MG tablet TAKE ONE TABLET BY MOUTH THREE TIMES DAILY MAY REDUCE TO 1/2 TAB AS PREFERRED DAY BEFORE HEMODIALYSIS. SKIP DOSE MORNING OF HD 08/15/21  Yes Karamalegos, [name], DO  dorzolamidel-timolol (COSOPT) 22.3-6.8 MG/ML SOLN ophthalmic solution Place 1 drop into the left eye 2 (two) times daily.    Yes [provider]  gentamicin cream (GARAMYCIN) 0.1 % APPLY TOPICALLY TO LEFT FOOT TWICE A DAY 10/13/21  Yes Edrick Kins, DPM  heparin 1000 unit/ml SOLN injection Inject into the peritoneum as needed. Three times per week before  dialysis   Yes [provider]  insulin glargine (LANTUS) 100 UNIT/ML injection INJECT 10 UNITS SUBCUTANEOUSLY ONCE A DAY 11/12/21  Yes Cannady, Jolene T, NP  latanoprost (XALATAN) 0.005 % ophthalmic solution Place 1 drop into the left eye daily. 10/18/21  Yes [provider]  levocetirizine (XYZAL) 5 MG tablet TAKE ONE TABLET BY MOUTH IN THE EVENING 10/13/21  Yes Karamalegos, [name], DO  midodrine (PROAMATINE) 10 MG tablet Take 5-10 mg by mouth daily as needed. 06/28/20  Yes [provider]  NOVOLOG 100 UNIT/ML injection Inject 5-10 Units into the skin 3 (three) times daily before meals. 11/11/21  Yes Cannady, Jolene T, NP  omeprazole (PRILOSEC) 40 MG capsule Take 40 mg by mouth daily. 04/27/19  Yes [provider]  promethazine (PHENERGAN) 12.5 MG tablet Take 1.0 Tablet Oral twice a day prn nausea 03/03/21  Yes [provider]  promethazine (PHENERGAN) 25 MG tablet Take 25 mg by mouth 2 (two) times daily as needed. 11/07/21  Yes [provider]  busPIRone (BUSPAR) 5 MG tablet Take 2 tablets (10 mg total) by mouth 2 (two) times daily as needed. 11/11/21   Venita Lick, NP  glucose blood test strip Use to check blood sugar 3 times daily, fasting in morning with goal <130 and 2 hours after meals with goal <180.  Bring blood sugar log to visits. 11/11/21   Marnee Guarneri T, NP  HUMALOG KWIKPEN 100 UNIT/ML KwikPen SMARTSIG:2 Unit(s) SUB-Q 3 Times Daily Patient not taking: Reported on 12/04/2021 11/11/21   [provider]  hydrALAZINE (APRESOLINE) 10 MG tablet Take 1 tablet (10 mg total) by mouth 2 (two) times daily. Patient not taking: Reported on 12/04/2021 11/11/21   Marnee Guarneri T, NP  Insulin Pen Needle 31G X 8 MM MISC Inject insulin 3 times a day as directed 07/17/21   Olin Hauser, DO  ipratropium (ATROVENT) 0.06 % nasal spray INHALE 2 SPRAYS IN EACH NOSTRIL 4 TIMES A DAY Patient not taking: Reported on 12/04/2021  07/14/21   Olin Hauser, DO  ketoconazole (NIZORAL) 2 % cream APPLY TOPICALLY ONTO THE SKIN (BOTH FEET) TWICE A DAY AS NEEDED 07/14/21   Parks Ranger, [name], DO  metoprolol tartrate (LOPRESSOR) 25 MG tablet Take 25 mg by mouth 2 (two) times daily. 11/18/21   [provider]  oxyCODONE (OXY IR/ROXICODONE) 5 MG immediate release tablet Take 1 tablet (5 mg total) by mouth every 8 (eight) hours as needed for severe pain. Must last 30 days. 10/13/21 11/12/21  Gillis Santa, MD  oxyCODONE (OXY IR/ROXICODONE) 5 MG immediate release tablet Take 1 tablet (5 mg total) by mouth every 8 (eight) hours as needed for severe pain. Must last 30 days. 11/12/21 12/12/21  Gillis Santa, MD  oxyCODONE (OXY IR/ROXICODONE) 5 MG immediate release tablet Take 1 tablet (5 mg total) by mouth every 8 (eight) hours as needed for severe pain. Must last 30 days. 12/12/21 01/11/22  Gillis Santa, MD  torsemide (DEMADEX) 100 MG tablet Take 1 tablet by mouth daily. 01/27/21   [provider]  TRUEPLUS INSULIN SYRINGE 29G X 1/2" 0.5 ML MISC USE 4 TIMES A DAY WITH LANTUS & NOVOLOG 10/13/21   Olin Hauser, DO    Allergies as of 11/26/2021 - Review Complete 11/25/2021  Allergen Reaction Noted   Cinnamon Anaphylaxis 92/10/9416   Garlic Anaphylaxis and Hives 11/28/2014   Onion Hives and Swelling 11/28/2014   Tylenol [acetaminophen] Anaphylaxis 07/25/2015   Losartan potassium  11/18/2021   Ciprofloxacin Diarrhea 11/05/2016   Ginger  09/03/2020   Prednisone Other (See Comments) 01/05/2017    Family History  Problem Relation Age of Onset   Hypertension Mother    Heart disease Mother    Hypertension Father    Skin cancer Father     Social History   Socioeconomic History   Marital status: Legally Separated    Spouse name: Not on file   Number of children: Not on file   Years of education: Not on file   Highest education level: Not on file  Occupational History   Occupation:  disabled  Tobacco Use   Smoking status: Former    Packs/day: 0.25    Years: 20.00    Pack years: 5.00    Types: Cigarettes    Quit date: 08/13/2021    Years since quitting: 0.3   Smokeless tobacco: Never  Vaping Use   Vaping Use: Never used  Substance and Sexual Activity   Alcohol use: No   Drug use: No   Sexual activity: Not on file  Other Topics Concern   Not on file  Social History Narrative   Patient lives at an extended stay hotel  currently.     Social Determinants of Health   Financial Resource Strain: Low Risk    Difficulty of Paying Living Expenses: Not hard at all  Food Insecurity: No Food Insecurity   Worried About Charity fundraiser in the Last Year: Never true   Arboriculturist in the Last Year: Never true  Transportation Needs: Unmet Transportation Needs   Lack of Transportation (Medical): Yes   Lack of Transportation (Non-Medical): No  Physical Activity: Inactive   Days of Exercise per Week: 0 days   Minutes of Exercise per Session: 0 min  Stress: No Stress Concern Present   Feeling of Stress : Only a little  Social Connections: Moderately Isolated   Frequency of Communication with Friends and Family: More than three times a week   Frequency of Social Gatherings with Friends and Family: More than three times a week   Attends Religious Services: Never   Marine scientist or Organizations: No   Attends Music therapist: Never   Marital Status: Living with partner  Intimate Partner Violence: Not on file    Review of Systems: See HPI, otherwise negative ROS  Physical Exam: BP (!) 180/78    Pulse 69    Temp (!) 96.3 F (35.7 C) (Temporal)    Resp 16    Ht 5\' 7"  (1.702 m)    Wt 108 kg    SpO2 100%    BMI 37.28 kg/m  General:   Alert,  pleasant and cooperative in NAD Head:  Normocephalic and atraumatic. Neck:  Supple; no masses or thyromegaly. Lungs:  Clear throughout to auscultation.    Heart:  Regular rate and rhythm. Abdomen:   Soft, nontender and nondistended. Normal bowel sounds, without guarding, and without rebound.   Neurologic:  Alert and  oriented x4;  grossly normal neurologically.  Impression/Plan: [name] is here for an endoscopy to be performed for dysphagia  Risks, benefits, limitations, and alternatives regarding  endoscopy have been reviewed with the patient.  Questions have been answered.  All parties agreeable.   [name], MD  12/04/2021, 7:37 AM

## 2021-12-05 ENCOUNTER — Encounter: Payer: Self-pay | Admitting: Gastroenterology

## 2021-12-08 ENCOUNTER — Encounter: Payer: Self-pay | Admitting: Gastroenterology

## 2021-12-08 LAB — SURGICAL PATHOLOGY

## 2021-12-11 ENCOUNTER — Encounter: Payer: Self-pay | Admitting: Podiatry

## 2021-12-11 ENCOUNTER — Ambulatory Visit (INDEPENDENT_AMBULATORY_CARE_PROVIDER_SITE_OTHER): Payer: 59 | Admitting: Podiatry

## 2021-12-11 ENCOUNTER — Other Ambulatory Visit: Payer: Self-pay

## 2021-12-11 DIAGNOSIS — M79675 Pain in left toe(s): Secondary | ICD-10-CM | POA: Diagnosis not present

## 2021-12-11 DIAGNOSIS — M79674 Pain in right toe(s): Secondary | ICD-10-CM | POA: Diagnosis not present

## 2021-12-11 DIAGNOSIS — M2042 Other hammer toe(s) (acquired), left foot: Secondary | ICD-10-CM | POA: Diagnosis not present

## 2021-12-11 DIAGNOSIS — E1140 Type 2 diabetes mellitus with diabetic neuropathy, unspecified: Secondary | ICD-10-CM | POA: Diagnosis not present

## 2021-12-11 DIAGNOSIS — B351 Tinea unguium: Secondary | ICD-10-CM

## 2021-12-11 DIAGNOSIS — E0843 Diabetes mellitus due to underlying condition with diabetic autonomic (poly)neuropathy: Secondary | ICD-10-CM

## 2021-12-11 DIAGNOSIS — L84 Corns and callosities: Secondary | ICD-10-CM

## 2021-12-11 DIAGNOSIS — M2041 Other hammer toe(s) (acquired), right foot: Secondary | ICD-10-CM | POA: Diagnosis not present

## 2021-12-11 NOTE — Progress Notes (Signed)
°  Subjective:  Patient ID: [name], female    DOB: [birth year],  MRN: [id number]  Chief Complaint  Patient presents with   Callouses    callus   55 y.o. female returns for the above complaint.  Patient presents with thickened elongated dystrophic toenails x10.  Patient states it painful to walk on.  She would like to have the repair done.  She is diabetic with A1c of 7.9.  She also states that she has is hyperkeratotic lesion/preulcerative lesion to left submetatarsal 5.  Pain with ambulation she is also here to pick up her diabetic shoes.  She denies any other acute complaints.  Objective:  There were no vitals filed for this visit. Podiatric Exam: Vascular: dorsalis pedis and posterior tibial pulses are palpable bilateral. Capillary return is immediate. Temperature gradient is WNL. Skin turgor WNL  Sensorium: Diminished emmes Weinstein monofilament test.  Diminished actile sensation bilaterally. Nail Exam: Pt has thick disfigured discolored nails with subungual debris noted bilateral entire nail hallux through fifth toenails.  Pain on palpation to the nails. Ulcer Exam: There is no evidence of ulcer or pre-ulcerative changes or infection. Orthopedic Exam: Muscle tone and strength are WNL. No limitations in general ROM. No crepitus or effusions noted. HAV  B/L.  Hammer toes 2-5  B/L. Skin: No Porokeratosis. No infection or ulcers.  Preulcerative lesion/callus formation noted to left submetatarsal 5    Assessment & Plan:   1. Pre-ulcerative calluses   2. Diabetes mellitus due to underlying condition with diabetic autonomic neuropathy, unspecified whether long term insulin use (HCC)   3. Pain due to onychomycosis of toenails of both feet        Patient was evaluated and treated and all questions answered.  Hammertoe contractures 2 through 5 bilaterally -I explained the patient given the nature of the hammertoe contracture in setting of uncontrolled diabetes with last A1c of 8.4  I believe patient will benefit from diabetic shoes.  She has a prescription from primary care physician to obtain diabetic shoes.   -Diabetic shoes were dispensed and are functioning well.  Left submetatarsal 3/4 preulcerative lesion -I explained the patient the etiology of preulcerative lesion various treatment options were discussed.  Patient is likely putting excessive pressure to the submetatarsal 3/4 leading to large preulcerative lesion.  I aggressively debrided down to healthy striated tissue using chisel and a blade.  No ulceration noted.  Onychomycosis with pain  -Nails palliatively debrided as below. -Educated on self-care  Procedure: Nail Debridement Rationale: pain  Type of Debridement: manual, sharp debridement. Instrumentation: Nail nipper, rotary burr. Number of Nails: 10  Procedures and Treatment: Consent by patient was obtained for treatment procedures. The patient understood the discussion of treatment and procedures well. All questions were answered thoroughly reviewed. Debridement of mycotic and hypertrophic toenails, 1 through 5 bilateral and clearing of subungual debris. No ulceration, no infection noted.  Return Visit-Office Procedure: Patient instructed to return to the office for a follow up visit 3 months for continued evaluation and treatment.  [name], DPM    No follow-ups on file.

## 2021-12-30 ENCOUNTER — Ambulatory Visit: Payer: 59 | Admitting: Nurse Practitioner

## 2021-12-30 DIAGNOSIS — N186 End stage renal disease: Secondary | ICD-10-CM

## 2021-12-30 DIAGNOSIS — E1159 Type 2 diabetes mellitus with other circulatory complications: Secondary | ICD-10-CM

## 2021-12-30 DIAGNOSIS — E1169 Type 2 diabetes mellitus with other specified complication: Secondary | ICD-10-CM

## 2021-12-30 DIAGNOSIS — E1142 Type 2 diabetes mellitus with diabetic polyneuropathy: Secondary | ICD-10-CM

## 2021-12-30 DIAGNOSIS — E559 Vitamin D deficiency, unspecified: Secondary | ICD-10-CM

## 2021-12-30 DIAGNOSIS — E1122 Type 2 diabetes mellitus with diabetic chronic kidney disease: Secondary | ICD-10-CM

## 2022-01-01 ENCOUNTER — Telehealth: Payer: Self-pay | Admitting: Nurse Practitioner

## 2022-01-01 NOTE — Telephone Encounter (Signed)
Copied from [facility] [phone]. Topic: Medicare AWV >> Jan 01, 2022  3:37 PM Lavonia Drafts wrote: Reason for CRM:  Left message for patient to call back and schedule the Medicare Annual Wellness Visit (AWV) virtually or by telephone.  Last AWV 01/23/20  Please schedule at anytime with [facility]-Nurse Health Advisor.  45 minute appointment  Any questions, please call me at [phone]

## 2022-01-06 ENCOUNTER — Ambulatory Visit
Payer: 59 | Attending: Student in an Organized Health Care Education/Training Program | Admitting: Student in an Organized Health Care Education/Training Program

## 2022-01-06 ENCOUNTER — Other Ambulatory Visit: Payer: Self-pay

## 2022-01-06 ENCOUNTER — Encounter: Payer: Self-pay | Admitting: Student in an Organized Health Care Education/Training Program

## 2022-01-06 VITALS — BP 158/65 | HR 71 | Temp 97.3°F | Resp 16 | Ht 67.0 in | Wt 237.0 lb

## 2022-01-06 DIAGNOSIS — M5030 Other cervical disc degeneration, unspecified cervical region: Secondary | ICD-10-CM | POA: Diagnosis present

## 2022-01-06 DIAGNOSIS — G894 Chronic pain syndrome: Secondary | ICD-10-CM | POA: Diagnosis present

## 2022-01-06 DIAGNOSIS — M47816 Spondylosis without myelopathy or radiculopathy, lumbar region: Secondary | ICD-10-CM

## 2022-01-06 DIAGNOSIS — M65342 Trigger finger, left ring finger: Secondary | ICD-10-CM

## 2022-01-06 DIAGNOSIS — M5416 Radiculopathy, lumbar region: Secondary | ICD-10-CM

## 2022-01-06 DIAGNOSIS — E1142 Type 2 diabetes mellitus with diabetic polyneuropathy: Secondary | ICD-10-CM | POA: Diagnosis present

## 2022-01-06 DIAGNOSIS — M5412 Radiculopathy, cervical region: Secondary | ICD-10-CM | POA: Diagnosis present

## 2022-01-06 MED ORDER — OXYCODONE HCL 5 MG PO TABS: 5.0000 mg | ORAL_TABLET | Freq: Three times a day (TID) | ORAL | 0 refills | Status: DC | PRN

## 2022-01-06 NOTE — Progress Notes (Signed)
PROVIDER NOTE: Information contained herein reflects review and annotations entered in association with encounter. Interpretation of such information and data should be left to medically-trained personnel. Information provided to patient can be located elsewhere in the medical record under "Patient Instructions". Document created using STT-dictation technology, any transcriptional errors that may result from process are unintentional.    Patient: [name]  Service Category: E/M  Provider: Gillis Santa, MD  DOB: 26-May-1966  DOS: 01/06/2022  Specialty: Interventional Pain Management  MRN: [id number]  Setting: Ambulatory outpatient  PCP: Olivia Lick, NP  Type: Established Patient    Referring Provider: Nobie Wilson *  Location: Office  Delivery: Face-to-face     HPI  [name], a 56 y.o. year old female, is here today because of her Diabetic polyneuropathy associated with type 2 diabetes mellitus ([place]) [E11.42]. [name] primary complain today is chronic pain Last encounter: My last encounter with her was on 10/07/21 Pertinent problems: [name] has ESRD (end stage renal disease) on dialysis [facility]); Type 2 diabetes mellitus with ESRD (end-stage renal disease) ([place]); Chronic bilateral low back pain; DDD (degenerative disc disease), cervical; Spondylosis of cervical region without myelopathy or radiculopathy; PAD (peripheral artery disease) ([place]); Chronic pain syndrome; Diabetic polyneuropathy associated with type 2 diabetes mellitus ([place]); Cervical radiculopathy; Lumbar radiculopathy; GAD (generalized anxiety disorder); and Recurrent major depression in partial remission (HCC) on their pertinent problem list. Vitals:  height is $RemoveB'5\' 7"'gRSeHHAW$  (1.702 m) and weight is 237 lb (107.5 kg). Her temporal temperature is 97.3 F (36.3 C) (abnormal). Her blood pressure is 158/65 (abnormal) and her pulse is 71. Her respiration is 16 and oxygen saturation is 100%.   Reason for encounter:  medication management.    [name] presents today for medication management.  No significant change in her medical history.  She did try physical therapy for approximately 3 weeks.  She states that it was helpful but it was a little too much for her in the context of her dialysis days being on Monday Wednesday Friday.  We discussed Qutenza treatment for painful diabetic neuropathy.  Risk and benefits reviewed and patient like to proceed.  Otherwise refill of chronic pain medication as below.  No change in dose.  Pharmacotherapy Assessment   Analgesic: Oxycodone 5 mg q8 hrs prn quantity 75/month; MME equals 22.5    Monitoring: [name] PMP: PDMP reviewed during this encounter.       Pharmacotherapy: No side-effects or adverse reactions reported. Compliance: No problems identified. Effectiveness: Clinically acceptable.  Olivia Patience, RN  01/06/2022  8:18 AM  Sign when Signing Visit Nursing Pain Medication Assessment:  Safety precautions to be maintained throughout the outpatient stay will include: orient to surroundings, keep bed in low position, maintain call bell within reach at all times, provide assistance with transfer out of bed and ambulation.  Medication Inspection Compliance: Pill count conducted under aseptic conditions, in front of the patient. Neither the pills nor the bottle was removed from the patient's sight at any time. Once count was completed pills were immediately returned to the patient in their original bottle.  Medication:  oxycodone Pill/Patch Count:  18 of 75 pills remain Pill/Patch Appearance: Markings consistent with prescribed medication Bottle Appearance: Standard pharmacy container. Clearly labeled. Filled Date: 19 / 23 / 2022 Last Medication intake:  Yesterday        ROS  Constitutional: Denies any fever or chills Musculoskeletal:  low back pain Neurological:  Paresthesias bilateral feet  Medication Review  Abatacept, INSULIN  SYRINGE .5CC/29G, brimonidine,  busPIRone, diltiazem, dorzolamidel-timolol, gentamicin cream, glucose blood, heparin, insulin aspart, insulin glargine, latanoprost, levocetirizine, midodrine, omeprazole, oxyCODONE, promethazine, and torsemide  History Review  Allergy: [name] is allergic to cinnamon, garlic, onion, tylenol [acetaminophen], losartan potassium, ciprofloxacin, ginger, and prednisone. Drug: [name]  reports no history of drug use. Alcohol:  reports no history of alcohol use. Tobacco:  reports that she quit smoking about 4 months ago. Her smoking use included cigarettes. She has a 5.00 pack-year smoking history. She has never used smokeless tobacco. Social: [name]  reports that she quit smoking about 4 months ago. Her smoking use included cigarettes. She has a 5.00 pack-year smoking history. She has never used smokeless tobacco. She reports that she does not drink alcohol and does not use drugs. Medical:  has a past medical history of Allergy, Anemia, Anxiety, Arthritis, Ataxia, COPD (chronic obstructive pulmonary disease) ([place]), Depression, Diabetes mellitus with complication ([place]), Dialysis patient ([place]), ESRD on hemodialysis ([place]), Fistula, Hepatitis (2003), Hiatal hernia, Hypercholesterolemia, Hypertension, Migraine, Neuropathy, diabetic ([place]), Non-alcoholic cirrhosis ([place]), Peripheral vascular disease ([place]), Pneumonia (2015), Psoriasis, Renal insufficiency, Tobacco dependence, and Wears dentures. Surgical: [name]  has a past surgical history that includes Tonsillectomy; rt. tubal and ovary removed; Cholecystectomy; Cardiac catheterization (N/A, 07/25/2015); Cardiac catheterization (Left, 07/25/2015); Cardiac catheterization (Left, 10/07/2015); Cardiac catheterization (N/A, 10/07/2015); Cardiac catheterization (10/07/2015); Cardiac catheterization (N/A, 12/17/2015); Incision and drainage abscess (N/A, 07/16/2016); cyst removed  from left hand (Left, 1989); AV fistula placement (Left, 11/2014); Cardiac  catheterization (N/A, 01/11/2017); Cardiac catheterization (N/A, 01/11/2017); DIALYSIS/PERMA CATHETER INSERTION (N/A, 05/20/2017); Cesarean section; Colonoscopy (N/A, 09/22/2017); Esophagogastroduodenoscopy (N/A, 09/22/2017); polypectomy (09/22/2017); A/V SHUNT INTERVENTION (N/A, 12/16/2017); STENT PLACEMENT VASCULAR ([facility]) (Left, 03/2018); Upper Extremity Angiography (Left, 09/29/2019); Aqueous shunt (Left, 12/20/2019); Colonoscopy with propofol (N/A, 04/16/2020); A/V Fistulagram (Left, 04/23/2020); and Esophagogastroduodenoscopy (egd) with propofol (N/A, 12/04/2021). Family: family history includes Heart disease in her mother; Hypertension in her father and mother; Skin cancer in her father.  Laboratory Chemistry Profile   Renal Lab Results  Component Value Date   BUN 58 (H) 11/11/2021   CREATININE 6.21 (H) 11/11/2021   BCR 9 11/11/2021   GFRAA 6 (L) 05/01/2019   GFRNONAA 6 (L) 05/01/2019     Hepatic Lab Results  Component Value Date   AST 16 11/11/2021   ALT 17 11/11/2021   ALBUMIN 4.2 11/11/2021   ALKPHOS 83 11/11/2021   HCVAB >11.0 (H) 12/16/2016   LIPASE 21 04/30/2019     Electrolytes Lab Results  Component Value Date   NA 136 11/11/2021   K 6.1 (H) 11/11/2021   CL 97 11/11/2021   CALCIUM 8.6 (L) 11/11/2021   MG 2.1 11/11/2021   PHOS 4.2 05/01/2019     Bone No results found for: VD25OH, VD125OH2TOT, UP1031RX4, [id number], 25OHVITD1, 25OHVITD2, 25OHVITD3, TESTOFREE, TESTOSTERONE   Inflammation (CRP: Acute Phase) (ESR: Chronic Phase) Lab Results  Component Value Date   CRP <0.8 12/16/2016   ESRSEDRATE 117 (H) 10/12/2017   LATICACIDVEN 1.6 04/30/2019       Note: Above Lab results reviewed.  Recent Imaging Review  DG Lumbar Spine Complete W/Bend CLINICAL DATA:  Chronic low back pain without known injury.  EXAM: LUMBAR SPINE - COMPLETE WITH BENDING VIEWS  COMPARISON:  None.  FINDINGS: No fracture or spondylolisthesis is noted. No change in vertebral body  alignment is noted on flexion or extension views. Mild degenerative disc disease is noted at L1-2, L2-3 and L3-4.  IMPRESSION: Mild multilevel degenerative disc disease. No acute abnormality  seen in the lumbar spine.  Electronically Signed   By: Marijo Conception M.D.   On: 02/05/2021 08:27  Note: Reviewed        Physical Exam  General appearance: Well nourished, well developed, and well hydrated. In no apparent acute distress Mental status: Alert, oriented x 3 (person, place, & time)       Respiratory: No evidence of acute respiratory distress Eyes: PERLA Vitals: BP (!) 158/65    Pulse 71    Temp (!) 97.3 F (36.3 C) (Temporal)    Resp 16    Ht 5\' 7"  (1.702 m)    Wt 237 lb (107.5 kg)    SpO2 100%    BMI 37.12 kg/m  BMI: Estimated body mass index is 37.12 kg/m as calculated from the following:   Height as of this encounter: 5\' 7"  (1.702 m).   Weight as of this encounter: 237 lb (107.5 kg). Ideal: Ideal body weight: 61.6 kg (135 lb 12.9 oz) Adjusted ideal body weight: 80 kg (176 lb 4.5 oz)    Lumbar Spine Area Exam  Skin & Axial Inspection: No masses, redness, or swelling Alignment: Symmetrical Functional ROM: Pain restricted ROM affecting both sides Stability: No instability detected Muscle Tone/Strength: Functionally intact. No obvious neuro-muscular anomalies detected. Sensory (Neurological): Musculoskeletal pain pattern   Provocative Tests: Hyperextension/rotation test: (+) bilaterally for facet joint pain. Lumbar quadrant test (Kemp's test): (+) bilaterally for facet joint pain.   Gait & Posture Assessment  Ambulation: Unassisted Gait: Antalgic gait (limping) Posture: Difficulty standing up straight, due to pain   Neuropathic pain of bilateral feet.  Paresthesias present.   Assessment   Status Diagnosis  Persistent Persistent Persistent 1. Diabetic polyneuropathy associated with type 2 diabetes mellitus (HCC)   2. Cervical radiculopathy   3. Lumbar  spondylosis   4. Lumbar radiculopathy   5. Trigger finger, left ring finger   6. DDD (degenerative disc disease), cervical   7. Chronic pain syndrome         Plan of Care  [name] has a current medication list which includes the following long-term medication(s): orencia, diltiazem, insulin glargine, levocetirizine, novolog, [START ON 01/11/2022] oxycodone, [START ON 02/10/2022] oxycodone, and [START ON 03/12/2022] oxycodone.  Pharmacotherapy (Medications Ordered): Meds ordered this encounter  Medications   oxyCODONE (OXY IR/ROXICODONE) 5 MG immediate release tablet    Sig: Take 1 tablet (5 mg total) by mouth every 8 (eight) hours as needed for severe pain. Must last 30 days.    Dispense:  75 tablet    Refill:  0    Chronic Pain. (STOP Act - Not applicable). Fill one day early if closed on scheduled refill date.   oxyCODONE (OXY IR/ROXICODONE) 5 MG immediate release tablet    Sig: Take 1 tablet (5 mg total) by mouth every 8 (eight) hours as needed for severe pain. Must last 30 days.    Dispense:  75 tablet    Refill:  0    Chronic Pain. (STOP Act - Not applicable). Fill one day early if closed on scheduled refill date.   oxyCODONE (OXY IR/ROXICODONE) 5 MG immediate release tablet    Sig: Take 1 tablet (5 mg total) by mouth every 8 (eight) hours as needed for severe pain. Must last 30 days.    Dispense:  75 tablet    Refill:  0    Chronic Pain. (STOP Act - Not applicable). Fill one day early if closed on scheduled refill date.  Patient has a history of diabetic peripheral neuropathy.  We discussed Qutenza capsaicin topical therapy for her condition.  The patient does have multiple comorbidities.  We discussed the benefits of topical Qutenza which includes not systemic absorption minimizing drug drug interactions, sustained pain relief and nonopioid.    Orders Placed This Encounter  Procedures   NEUROLYSIS    Please order Qutenza patches from pharmacy    Standing Status:    Future    Standing Expiration Date:   04/06/2022    Order Specific Question:   Where will this procedure be performed?    Answer:   [facility]   Follow-up plan:   Return in about 3 months (around 04/06/2022) for Medication Management, in person.   Recent Visits No visits were found meeting these conditions. Showing recent visits within past 90 days and meeting all other requirements Today's Visits Date Type Provider Dept  01/06/22 Office Visit Olivia Santa, MD [facility]  Showing today's visits and meeting all other requirements Future Appointments Date Type Provider Dept  04/02/22 Appointment Olivia Santa, MD [facility]  Showing future appointments within next 90 days and meeting all other requirements  I discussed the assessment and treatment plan with the patient. The patient was provided an opportunity to ask questions and all were answered. The patient agreed with the plan and demonstrated an understanding of the instructions.  Patient advised to call back or seek an in-person evaluation if the symptoms or condition worsens.  Duration of encounter:30 minutes.  Note by: Olivia Santa, MD Date: 01/06/2022; Time: 8:55 AM

## 2022-01-06 NOTE — Patient Instructions (Signed)
Capsaicin topical patch What is this medication? CAPSAICIN (cap SAY sin) is a pain reliever. It is used to treat postherpetic neuralgia (PHN) or diabetic neuropathy pain. This medicine may be used for other purposes; ask your health care provider or pharmacist if you have questions. COMMON BRAND NAME(S): Qutenza What should I tell my care team before I take this medication? They need to know if you have any of these conditions: broken or irritated skin high blood pressure history of heart attack or stroke an unusual or allergic reaction to capsaicin, hot peppers, other medicines, foods, dyes, or preservatives pregnant or trying to get pregnant breast-feeding How should I use this medication? This medicine is for external use only. It is applied by a health care professional in a hospital or clinic setting. Talk to your pediatrician regarding the use of this medicine in children. Special care may be needed. Overdosage: If you think you have taken too much of this medicine contact a poison control center or emergency room at once. NOTE: This medicine is only for you. Do not share this medicine with others. What if I miss a dose? This does not apply. What may interact with this medication? Interactions are not expected. Do not use any other skin products on the affected area without asking your doctor or health care professional. This list may not describe all possible interactions. Give your health care provider a list of all the medicines, herbs, non-prescription drugs, or dietary supplements you use. Also tell them if you smoke, drink alcohol, or use illegal drugs. Some items may interact with your medicine. What should I watch for while using this medication? Your condition will be monitored carefully while you are receiving this medicine. Your blood pressure may go up during the procedure. Do not touch the drug patch during treatment. This medicine causes red, burning skin. You may need  pain medicine for during and after the procedure. This medicine can make you more sensitive to heat for a few days after treatment. Be careful in hot showers or baths. Keep out of the sun. Exercise may make the treated skin feel hotter. Tell your doctor or healthcare professional if your symptoms do not start to get better or if they get worse. What side effects may I notice from receiving this medication? Side effects that you should report to your doctor or health care professional as soon as possible: allergic reactions like skin rash, itching or hives, swelling of the face, lips, or tongue burning pain, redness that does not go away changes in blood pressure cough or trouble breathing eye irritation skin sores or thinning Side effects that usually do not require medical attention (report to your doctor or health care professional if they continue or are bothersome): bruising dry skin nausea unusual body smell This list may not describe all possible side effects. Call your doctor for medical advice about side effects. You may report side effects to FDA at 1-800-FDA-1088. Where should I keep my medication? This drug is given in a hospital or clinic and will not be stored at home. NOTE: This sheet is a summary. It may not cover all possible information. If you have questions about this medicine, talk to your doctor, pharmacist, or health care provider.  2022 Elsevier/Gold Standard (2019-07-25 00:00:00)

## 2022-01-06 NOTE — Progress Notes (Signed)
Nursing Pain Medication Assessment:  Safety precautions to be maintained throughout the outpatient stay will include: orient to surroundings, keep bed in low position, maintain call bell within reach at all times, provide assistance with transfer out of bed and ambulation.  Medication Inspection Compliance: Pill count conducted under aseptic conditions, in front of the patient. Neither the pills nor the bottle was removed from the patient's sight at any time. Once count was completed pills were immediately returned to the patient in their original bottle.  Medication:  oxycodone Pill/Patch Count:  18 of 75 pills remain Pill/Patch Appearance: Markings consistent with prescribed medication Bottle Appearance: Standard pharmacy container. Clearly labeled. Filled Date: 47 / 23 / 2022 Last Medication intake:  Yesterday

## 2022-01-16 ENCOUNTER — Other Ambulatory Visit: Payer: Self-pay | Admitting: Otolaryngology

## 2022-01-16 DIAGNOSIS — E041 Nontoxic single thyroid nodule: Secondary | ICD-10-CM

## 2022-01-22 ENCOUNTER — Encounter: Payer: Self-pay | Admitting: Gastroenterology

## 2022-01-23 ENCOUNTER — Other Ambulatory Visit: Payer: Self-pay | Admitting: Family Medicine

## 2022-01-23 DIAGNOSIS — E1122 Type 2 diabetes mellitus with diabetic chronic kidney disease: Secondary | ICD-10-CM

## 2022-01-23 DIAGNOSIS — N186 End stage renal disease: Secondary | ICD-10-CM

## 2022-01-23 NOTE — Telephone Encounter (Signed)
Requested medication (s) are due for refill today:   Not sure   Was last prescribed by Dr. Parks Ranger with [facility].   It looks like she has established care with [facility] now.  Requested medication (s) are on the active medication list:   Yes  Future visit scheduled:   Not sure   Last ordered: 10/13/2021 #100, 1 refill  Returned because no protocol assigned to this medication.   Also it was last prescribed by another provider.   Requested Prescriptions  Pending Prescriptions Disp Refills   TRUEPLUS INSULIN SYRINGE 29G X 1/2" 0.5 ML MISC [Pharmacy Med Name: TRUEplus Insulin 0.5 mL 29 gauge x 1/2" syringe] 100 each 1    Sig: USE 4 TIMES A DAY WITH LANTUS & NOVOLOG     There is no refill protocol information for this order

## 2022-01-28 ENCOUNTER — Other Ambulatory Visit: Payer: Self-pay | Admitting: Family Medicine

## 2022-01-28 DIAGNOSIS — I10 Essential (primary) hypertension: Secondary | ICD-10-CM

## 2022-01-28 NOTE — Telephone Encounter (Signed)
Last refill 01/08/22.  Requested Prescriptions  Refused Prescriptions Disp Refills   diltiazem (CARDIZEM) 30 MG tablet [Pharmacy Med Name: diltiazem 30 mg tablet] 270 tablet 1    Sig: TAKE ONE TABLET BY MOUTH THREE TIMES DAILY MAY REDUCE TO 1/2 TAB AS PREFERRED DAY BEFORE HEMODIALYSIS. SKIP DOSE MORNING OF HD     Cardiovascular: Calcium Channel Blockers 3 Failed - 01/28/2022  8:01 AM      Failed - Cr in normal range and within 360 days    Creat  Date Value Ref Range Status  12/27/2017 7.43 (H) 0.50 - 1.05 mg/dL Final    Comment:    For patients >4 years of age, the reference limit for Creatinine is approximately 13% higher for people identified as African-American. .    Creatinine, Ser  Date Value Ref Range Status  11/11/2021 6.21 (H) 0.57 - 1.00 mg/dL Final         Failed - Last BP in normal range    BP Readings from Last 1 Encounters:  01/06/22 (!) 158/65         Passed - ALT in normal range and within 360 days    ALT  Date Value Ref Range Status  11/11/2021 17 0 - 32 IU/L Final   SGPT (ALT)  Date Value Ref Range Status  10/15/2014 54 U/L Final    Comment:    14-63 NOTE: New Reference Range 07/10/14    ALT (SGPT) P5P  Date Value Ref Range Status  07/11/2019 28 0 - 40 IU/L Final         Passed - AST in normal range and within 360 days    AST  Date Value Ref Range Status  11/11/2021 16 0 - 40 IU/L Final   SGOT(AST)  Date Value Ref Range Status  10/15/2014 46 (H) 15 - 37 Unit/L Final         Passed - Last Heart Rate in normal range    Pulse Readings from Last 1 Encounters:  01/06/22 71         Passed - Valid encounter within last 6 months    Recent Outpatient Visits          2 months ago Type 2 diabetes mellitus with ESRD (end-stage renal disease) ([place])   [facility], Jolene T, NP   5 months ago Type 2 diabetes mellitus with ESRD (end-stage renal disease) ([place])   [facility], [name], DO   11  months ago Type 2 diabetes mellitus with ESRD (end-stage renal disease) ([place])   [facility] Olin Hauser, DO   1 year ago Type 2 diabetes mellitus with ESRD (end-stage renal disease) ([place])   [facility] Olin Hauser, DO   2 years ago Type 2 diabetes mellitus with ESRD (end-stage renal disease) ([place])   [facility] [place], [name], DO

## 2022-02-03 ENCOUNTER — Ambulatory Visit
Admission: RE | Admit: 2022-02-03 | Discharge: 2022-02-03 | Disposition: A | Discharge status: Home / Self Care | Payer: 59 | Source: Ambulatory Visit | Attending: Otolaryngology | Admitting: Otolaryngology

## 2022-02-03 DIAGNOSIS — E041 Nontoxic single thyroid nodule: Secondary | ICD-10-CM

## 2022-02-09 ENCOUNTER — Other Ambulatory Visit: Payer: Self-pay | Admitting: Family Medicine

## 2022-02-09 DIAGNOSIS — I10 Essential (primary) hypertension: Secondary | ICD-10-CM

## 2022-02-10 NOTE — Telephone Encounter (Signed)
Requested Prescriptions  Pending Prescriptions Disp Refills   diltiazem (CARDIZEM) 30 MG tablet [Pharmacy Med Name: diltiazem 30 mg tablet] 270 tablet 0    Sig: TAKE ONE TABLET BY MOUTH THREE TIMES DAILY MAY REDUCE TO 1/2 TAB AS PREFERRED DAY BEFORE HEMODIALYSIS. SKIP DOSE MORNING OF HD     Cardiovascular: Calcium Channel Blockers 3 Failed - 02/09/2022  8:30 AM      Failed - Cr in normal range and within 360 days    Creat  Date Value Ref Range Status  12/27/2017 7.43 (H) 0.50 - 1.05 mg/dL Final    Comment:    For patients >9 years of age, the reference limit for Creatinine is approximately 13% higher for people identified as African-American. .    Creatinine, Ser  Date Value Ref Range Status  11/11/2021 6.21 (H) 0.57 - 1.00 mg/dL Final         Failed - Last BP in normal range    BP Readings from Last 1 Encounters:  01/06/22 (!) 158/65         Passed - ALT in normal range and within 360 days    ALT  Date Value Ref Range Status  11/11/2021 17 0 - 32 IU/L Final   SGPT (ALT)  Date Value Ref Range Status  10/15/2014 54 U/L Final    Comment:    14-63 NOTE: New Reference Range 07/10/14    ALT (SGPT) P5P  Date Value Ref Range Status  07/11/2019 28 0 - 40 IU/L Final         Passed - AST in normal range and within 360 days    AST  Date Value Ref Range Status  11/11/2021 16 0 - 40 IU/L Final   SGOT(AST)  Date Value Ref Range Status  10/15/2014 46 (H) 15 - 37 Unit/L Final         Passed - Last Heart Rate in normal range    Pulse Readings from Last 1 Encounters:  01/06/22 71         Passed - Valid encounter within last 6 months    Recent Outpatient Visits          3 months ago Type 2 diabetes mellitus with ESRD (end-stage renal disease) ([place])   [place], Jolene T, NP   5 months ago Type 2 diabetes mellitus with ESRD (end-stage renal disease) ([place])   [facility], [name], DO   11 months ago Type 2 diabetes  mellitus with ESRD (end-stage renal disease) ([place])   [facility] Olin Hauser, DO   1 year ago Type 2 diabetes mellitus with ESRD (end-stage renal disease) ([place])   [facility] Olin Hauser, DO   2 years ago Type 2 diabetes mellitus with ESRD (end-stage renal disease) ([place])   [facility] Olin Hauser, DO      Future Appointments            In 1 week [name], MD [facility] [place]

## 2022-02-19 ENCOUNTER — Ambulatory Visit (INDEPENDENT_AMBULATORY_CARE_PROVIDER_SITE_OTHER): Payer: 59 | Admitting: Gastroenterology

## 2022-02-19 ENCOUNTER — Other Ambulatory Visit: Payer: Self-pay

## 2022-02-19 ENCOUNTER — Encounter: Payer: Self-pay | Admitting: Gastroenterology

## 2022-02-19 DIAGNOSIS — K224 Dyskinesia of esophagus: Secondary | ICD-10-CM

## 2022-02-19 MED ORDER — BACLOFEN 5 MG PO TABS: 1.0000 | ORAL_TABLET | Freq: Three times a day (TID) | ORAL | 3 refills | Status: DC

## 2022-02-19 NOTE — Progress Notes (Signed)
Primary Care Physician: Venita Lick, NP  Primary Gastroenterologist:  Dr. [name]  Chief Complaint  Patient presents with   Follow-up    HPI: [name] is a 56 y.o. female here who is seen by me for dysphagia with an upper endoscopy that the patient had with dilation of the esophagus.  The patient continued to have symptoms and was suggested to take baclofen.  The patient had read the side effects and did not want to start the baclofen.  The patient was thought to have esophageal spasms with mid sternal chest pain postprandially. She had baclofen for leg cramps but stopped it because of auditory side effects and visual hallucination.  The patient reports that she is not sure if it was due to the baclofen or the other medications.  She was on multiple medications for high blood pressure.  The patient is also taking diltiazem at the present time and does not report her symptoms to be affected by that.  She states that she had an episode this morning and it is always when she eats and the food feels like it gets stuck despite her waiting for it to become room temperature and she sometimes has to throw it up.  Past Medical History:  Diagnosis Date   Allergy    Anemia    Anxiety    worse when not at home - bowel incontinence   Arthritis    Ataxia    COPD (chronic obstructive pulmonary disease) ([place])    Depression    Diabetes mellitus with complication ([place])    Dialysis patient ([place])    ESRD on hemodialysis ([place])    MWF dialysis   Fistula    left upper arm   Hepatitis 2003   Hep C   Hiatal hernia    Hypercholesterolemia    Hypertension    Migraine    Neuropathy, diabetic (H[facility])    lower legs   Non-alcoholic cirrhosis (H[facility])    Peripheral vascular disease (H[facility])    Pneumonia 2015   Psoriasis    Renal insufficiency    Tobacco dependence    Wears dentures    full upper, partial lower    Current Outpatient Medications  Medication Sig Dispense Refill   Baclofen 5  MG TABS Take 1 tablet by mouth 3 (three) times daily. 90 tablet 3   Abatacept (ORENCIA) 125 MG/ML SOSY Inject into the skin.     brimonidine (ALPHAGAN) 0.2 % ophthalmic solution Place 1 drop into the left eye in the morning and at bedtime.      busPIRone (BUSPAR) 5 MG tablet Take 2 tablets (10 mg total) by mouth 2 (two) times daily as needed. 180 tablet 4   diltiazem (CARDIZEM) 30 MG tablet TAKE ONE TABLET BY MOUTH THREE TIMES DAILY MAY REDUCE TO 1/2 TAB AS PREFERRED DAY BEFORE HEMODIALYSIS. SKIP DOSE MORNING OF HD 270 tablet 0   dorzolamidel-timolol (COSOPT) 22.3-6.8 MG/ML SOLN ophthalmic solution Place 1 drop into the left eye 2 (two) times daily.      gentamicin cream (GARAMYCIN) 0.1 % APPLY TOPICALLY TO LEFT FOOT TWICE A DAY 30 g 1   glucose blood test strip Use to check blood sugar 3 times daily, fasting in morning with goal <130 and 2 hours after meals with goal <180.  Bring blood sugar log to visits. 100 each 12   heparin 1000 unit/ml SOLN injection Inject into the peritoneum as needed. Three times per week before dialysis  insulin glargine (LANTUS) 100 UNIT/ML injection INJECT 10 UNITS SUBCUTANEOUSLY ONCE A DAY 10 mL 2   latanoprost (XALATAN) 0.005 % ophthalmic solution Place 1 drop into the left eye daily.     levocetirizine (XYZAL) 5 MG tablet TAKE ONE TABLET BY MOUTH IN THE EVENING 90 tablet 3   midodrine (PROAMATINE) 10 MG tablet Take 5-10 mg by mouth daily as needed.     NOVOLOG 100 UNIT/ML injection Inject 5-10 Units into the skin 3 (three) times daily before meals. 10 mL 11   omeprazole (PRILOSEC) 40 MG capsule Take 40 mg by mouth daily.     oxyCODONE (OXY IR/ROXICODONE) 5 MG immediate release tablet Take 1 tablet (5 mg total) by mouth every 8 (eight) hours as needed for severe pain. Must last 30 days. 75 tablet 0   oxyCODONE (OXY IR/ROXICODONE) 5 MG immediate release tablet Take 1 tablet (5 mg total) by mouth every 8 (eight) hours as needed for severe pain. Must last 30 days. 75  tablet 0   [START ON 03/12/2022] oxyCODONE (OXY IR/ROXICODONE) 5 MG immediate release tablet Take 1 tablet (5 mg total) by mouth every 8 (eight) hours as needed for severe pain. Must last 30 days. 75 tablet 0   promethazine (PHENERGAN) 12.5 MG tablet Take 1.0 Tablet Oral twice a day prn nausea (Patient not taking: Reported on 01/06/2022)     promethazine (PHENERGAN) 25 MG tablet Take 25 mg by mouth 2 (two) times daily as needed.     torsemide (DEMADEX) 100 MG tablet Take 1 tablet by mouth daily.     TRUEPLUS INSULIN SYRINGE 29G X 1/2" 0.5 ML MISC USE 4 TIMES A DAY WITH LANTUS & NOVOLOG 100 each 4   No current facility-administered medications for this visit.    Allergies as of 02/19/2022 - Review Complete 01/06/2022  Allergen Reaction Noted   Cinnamon Anaphylaxis 70/62/3762   Garlic Anaphylaxis and Hives 11/28/2014   Onion Hives and Swelling 11/28/2014   Tylenol [acetaminophen] Anaphylaxis 07/25/2015   Losartan potassium  11/18/2021   Ciprofloxacin Diarrhea 11/05/2016   Ginger  09/03/2020   Prednisone Other (See Comments) 01/05/2017    ROS:  General: Negative for anorexia, weight loss, fever, chills, fatigue, weakness. ENT: Negative for hoarseness, difficulty swallowing , nasal congestion. CV: Negative for chest pain, angina, palpitations, dyspnea on exertion, peripheral edema.  Respiratory: Negative for dyspnea at rest, dyspnea on exertion, cough, sputum, wheezing.  GI: See history of present illness. GU:  Negative for dysuria, hematuria, urinary incontinence, urinary frequency, nocturnal urination.  Endo: Negative for unusual weight change.    Physical Examination:   There were no vitals taken for this visit.  General: Well-nourished, well-developed in no acute distress.  Eyes: No icterus. Conjunctivae pink. Neuro: Alert and oriented x 3.  Grossly intact. Skin: Warm and dry, no jaundice.   Psych: Alert and cooperative, normal mood and affect.  Labs:    Imaging Studies: US  THYROID  Result Date: 02/03/2022 CLINICAL DATA:  56 year old female with a history of thyroid nodules Prior biopsy 09/28/2019 EXAM: THYROID ULTRASOUND TECHNIQUE: Ultrasound examination of the thyroid gland and adjacent soft tissues was performed. COMPARISON:  09/28/2019, 09/12/2019 FINDINGS: Parenchymal Echotexture: Mildly heterogenous Isthmus: 0.7 cm Right lobe: 5.7 cm x 1.7 cm x 2.0 cm Left lobe: 4.7 cm x 1.6 cm x 1.8 cm _________________________________________________________ Estimated total number of nodules >/= 1 cm: 1 Number of spongiform nodules >/=  2 cm not described below (TR1): 0 Number of mixed cystic and solid nodules >/=  1.5 cm not described below (TR2): 0 _________________________________________________________ Left superior thyroid nodule measures 1.7 cm, previously 2.2 cm. Nodule has been previously biopsied. Assuming a prior benign result, no further specific follow-up would be indicated. No adenopathy Recommendations follow those established by the new ACR TI-RADS criteria (J Am Coll Radiol 2017;14:587-595). IMPRESSION: Decreasing size of the left superior thyroid nodule which was previously biopsied. Recommend correlation with prior biopsy result. Electronically Signed   By: Corrie Mckusick D.O.   On: 02/03/2022 15:14    Assessment and Plan:   [name] is a 56 y.o. y/o female who comes in today with a history of dysphagia with an upper endoscopy not showing any narrowing and the patient also had dilation of the esophagus without improvement.  Her symptoms are only while she is eating and only intermittently consistent with esophageal spasms.  The patient has been told that it is curious that her symptoms are not improved by her diltiazem but she is willing to retry the baclofen and she has been told to stop the medication if she starts having any side effects.  She has also been offered esophageal manometry if her symptoms do not improve.  The patient has been explained the plan agrees  with it.  [name], MD. Marval Regal    Note: This dictation was prepared with Dragon dictation along with smaller phrase technology. Any transcriptional errors that result from this process are unintentional.

## 2022-02-20 ENCOUNTER — Telehealth: Payer: Self-pay | Admitting: Nurse Practitioner

## 2022-02-20 NOTE — Telephone Encounter (Signed)
Copied from [place] [id number]. Topic: Medicare AWV ?>> Feb 20, 2022 12:28 PM Lavonia Drafts wrote: ?Reason for CRM:  ?N/A unable to leave a message for patient to call back and schedule the Medicare Annual Wellness Visit (AWV) virtually or by telephone. ? ?Last AWV 01/23/20 ? ?Please schedule at anytime with [facility]visor. ? ?45 minute appointment ? ?Any questions, please call me at [phone] ?

## 2022-02-25 ENCOUNTER — Other Ambulatory Visit: Payer: Self-pay | Admitting: Neurosurgery

## 2022-02-25 DIAGNOSIS — R531 Weakness: Secondary | ICD-10-CM

## 2022-03-03 ENCOUNTER — Encounter: Payer: Self-pay | Admitting: Nurse Practitioner

## 2022-03-05 ENCOUNTER — Telehealth: Payer: Self-pay | Admitting: Student in an Organized Health Care Education/Training Program

## 2022-03-05 ENCOUNTER — Other Ambulatory Visit: Payer: Self-pay

## 2022-03-05 ENCOUNTER — Telehealth: Payer: Self-pay

## 2022-03-05 DIAGNOSIS — E1142 Type 2 diabetes mellitus with diabetic polyneuropathy: Secondary | ICD-10-CM

## 2022-03-05 DIAGNOSIS — M5412 Radiculopathy, cervical region: Secondary | ICD-10-CM

## 2022-03-05 DIAGNOSIS — G894 Chronic pain syndrome: Secondary | ICD-10-CM

## 2022-03-05 MED ORDER — OXYCODONE HCL 5 MG PO TABS: 5.0000 mg | ORAL_TABLET | Freq: Three times a day (TID) | ORAL | 0 refills | Status: DC | PRN

## 2022-03-05 NOTE — Telephone Encounter (Signed)
Patient was using [facility], now going to [facility]. Please send new scripts to them. [facility] closed. Please notify patient when she can pick up meds. Thank you ?

## 2022-03-05 NOTE — Telephone Encounter (Signed)
Refill request sent to Dr Holley Raring ?

## 2022-03-06 NOTE — Patient Instructions (Incomplete)

## 2022-03-09 ENCOUNTER — Telehealth: Payer: Self-pay | Admitting: Nurse Practitioner

## 2022-03-09 ENCOUNTER — Ambulatory Visit: Payer: 59 | Admitting: Nurse Practitioner

## 2022-03-09 DIAGNOSIS — F3341 Major depressive disorder, recurrent, in partial remission: Secondary | ICD-10-CM

## 2022-03-09 DIAGNOSIS — E1122 Type 2 diabetes mellitus with diabetic chronic kidney disease: Secondary | ICD-10-CM

## 2022-03-09 DIAGNOSIS — E1159 Type 2 diabetes mellitus with other circulatory complications: Secondary | ICD-10-CM

## 2022-03-09 DIAGNOSIS — B182 Chronic viral hepatitis C: Secondary | ICD-10-CM

## 2022-03-09 DIAGNOSIS — M0579 Rheumatoid arthritis with rheumatoid factor of multiple sites without organ or systems involvement: Secondary | ICD-10-CM

## 2022-03-09 DIAGNOSIS — L4050 Arthropathic psoriasis, unspecified: Secondary | ICD-10-CM

## 2022-03-09 DIAGNOSIS — G894 Chronic pain syndrome: Secondary | ICD-10-CM

## 2022-03-09 DIAGNOSIS — E1142 Type 2 diabetes mellitus with diabetic polyneuropathy: Secondary | ICD-10-CM

## 2022-03-09 DIAGNOSIS — J00 Acute nasopharyngitis [common cold]: Secondary | ICD-10-CM

## 2022-03-09 DIAGNOSIS — N186 End stage renal disease: Secondary | ICD-10-CM

## 2022-03-09 DIAGNOSIS — E113593 Type 2 diabetes mellitus with proliferative diabetic retinopathy without macular edema, bilateral: Secondary | ICD-10-CM

## 2022-03-09 MED ORDER — NOVOLOG 100 UNIT/ML IJ SOLN: 5.0000 [IU] | Freq: Three times a day (TID) | INTRAMUSCULAR | 11 refills | Status: DC

## 2022-03-09 MED ORDER — INSULIN GLARGINE 100 UNIT/ML ~~LOC~~ SOLN: SUBCUTANEOUS | 5 refills | Status: DC

## 2022-03-09 MED ORDER — LEVOCETIRIZINE DIHYDROCHLORIDE 5 MG PO TABS: 5.0000 mg | ORAL_TABLET | Freq: Every evening | ORAL | 4 refills | Status: DC

## 2022-03-09 NOTE — Telephone Encounter (Signed)
Called pt to reschedule appt and we got her rescheduled for 3/29. Pt states she was coming in for medication refills and would like a courtesy refill for her medications to last to her appointment sent to [facility]. Levocetirizine (Xyzal), Lantus, and Novolog. Pt states she has enough of the other medications to last until her appt. These are the ones she will run out of. ?

## 2022-03-10 ENCOUNTER — Ambulatory Visit
Admission: RE | Admit: 2022-03-10 | Discharge: 2022-03-10 | Disposition: A | Discharge status: Home / Self Care | Payer: 59 | Source: Ambulatory Visit | Attending: Neurosurgery | Admitting: Neurosurgery

## 2022-03-10 ENCOUNTER — Other Ambulatory Visit: Payer: Self-pay

## 2022-03-10 DIAGNOSIS — R531 Weakness: Secondary | ICD-10-CM

## 2022-03-12 ENCOUNTER — Ambulatory Visit: Payer: 59 | Admitting: Podiatry

## 2022-03-15 NOTE — Patient Instructions (Signed)

## 2022-03-18 ENCOUNTER — Ambulatory Visit (INDEPENDENT_AMBULATORY_CARE_PROVIDER_SITE_OTHER): Payer: 59 | Admitting: Nurse Practitioner

## 2022-03-18 ENCOUNTER — Other Ambulatory Visit: Payer: Self-pay

## 2022-03-18 ENCOUNTER — Encounter: Payer: Self-pay | Admitting: Nurse Practitioner

## 2022-03-18 VITALS — BP 138/78 | HR 77 | Temp 97.4°F | Ht 67.01 in | Wt 234.8 lb

## 2022-03-18 DIAGNOSIS — E113593 Type 2 diabetes mellitus with proliferative diabetic retinopathy without macular edema, bilateral: Secondary | ICD-10-CM

## 2022-03-18 DIAGNOSIS — G894 Chronic pain syndrome: Secondary | ICD-10-CM

## 2022-03-18 DIAGNOSIS — N186 End stage renal disease: Secondary | ICD-10-CM

## 2022-03-18 DIAGNOSIS — B182 Chronic viral hepatitis C: Secondary | ICD-10-CM

## 2022-03-18 DIAGNOSIS — F411 Generalized anxiety disorder: Secondary | ICD-10-CM

## 2022-03-18 DIAGNOSIS — F17219 Nicotine dependence, cigarettes, with unspecified nicotine-induced disorders: Secondary | ICD-10-CM

## 2022-03-18 DIAGNOSIS — E1142 Type 2 diabetes mellitus with diabetic polyneuropathy: Secondary | ICD-10-CM

## 2022-03-18 DIAGNOSIS — F3341 Major depressive disorder, recurrent, in partial remission: Secondary | ICD-10-CM

## 2022-03-18 DIAGNOSIS — M0579 Rheumatoid arthritis with rheumatoid factor of multiple sites without organ or systems involvement: Secondary | ICD-10-CM

## 2022-03-18 DIAGNOSIS — L97521 Non-pressure chronic ulcer of other part of left foot limited to breakdown of skin: Secondary | ICD-10-CM

## 2022-03-18 DIAGNOSIS — E1122 Type 2 diabetes mellitus with diabetic chronic kidney disease: Secondary | ICD-10-CM | POA: Diagnosis not present

## 2022-03-18 DIAGNOSIS — R197 Diarrhea, unspecified: Secondary | ICD-10-CM

## 2022-03-18 DIAGNOSIS — Z992 Dependence on renal dialysis: Secondary | ICD-10-CM

## 2022-03-18 DIAGNOSIS — I739 Peripheral vascular disease, unspecified: Secondary | ICD-10-CM

## 2022-03-18 DIAGNOSIS — I152 Hypertension secondary to endocrine disorders: Secondary | ICD-10-CM

## 2022-03-18 DIAGNOSIS — E11621 Type 2 diabetes mellitus with foot ulcer: Secondary | ICD-10-CM

## 2022-03-18 DIAGNOSIS — E1159 Type 2 diabetes mellitus with other circulatory complications: Secondary | ICD-10-CM

## 2022-03-18 DIAGNOSIS — K580 Irritable bowel syndrome with diarrhea: Secondary | ICD-10-CM

## 2022-03-18 DIAGNOSIS — E559 Vitamin D deficiency, unspecified: Secondary | ICD-10-CM

## 2022-03-18 DIAGNOSIS — E538 Deficiency of other specified B group vitamins: Secondary | ICD-10-CM

## 2022-03-18 LAB — BAYER DCA HB A1C WAIVED: HB A1C (BAYER DCA - WAIVED): 7.5 % — ABNORMAL HIGH (ref 4.8–5.6)

## 2022-03-18 MED ORDER — OMEPRAZOLE 40 MG PO CPDR: 40.0000 mg | DELAYED_RELEASE_CAPSULE | Freq: Every day | ORAL | 4 refills | Status: DC

## 2022-03-18 MED ORDER — GLUCOSE BLOOD VI STRP: ORAL_STRIP | 12 refills | Status: DC

## 2022-03-18 MED ORDER — DILTIAZEM HCL 30 MG PO TABS: ORAL_TABLET | ORAL | 4 refills | Status: DC

## 2022-03-18 MED ORDER — "INSULIN SYRINGE 29G X 1/2"" 0.5 ML MISC": 4 refills | Status: DC

## 2022-03-18 NOTE — Assessment & Plan Note (Signed)
Refer to diabetes with ESRD plan of care. 

## 2022-03-18 NOTE — Assessment & Plan Note (Signed)
Chronic, ongoing with level above goal in office initially, but improved on manual repeat.  She does run low during dialysis -- continue Midodrine as needed in dialysis.  New referral to [facility] cardiology per patient request, she would like POTS assessment.  Can not take ACE/ARB due to underlying hyperkalemia.  Continue Diltiazem and Torsemide at current doses.  Has had multiple adverse affects to BP medications.  Focus on DASH diet at home.  Check BP daily and document for provider visits.  CMP and TSH today.  Return in 4 weeks. ?

## 2022-03-18 NOTE — Assessment & Plan Note (Signed)
Refer to depression plan of care. 

## 2022-03-18 NOTE — Assessment & Plan Note (Signed)
Chronic and ongoing issue.  Continue collaboration with GI. ?

## 2022-03-18 NOTE — Assessment & Plan Note (Signed)
No smoking since August 2022 -- recommend continued cessation. 

## 2022-03-18 NOTE — Assessment & Plan Note (Signed)
Followed by rheumatology and pain management, continue these collaborations.  Recent notes reviewed.  Continue medications as prescribed by them.   ?

## 2022-03-18 NOTE — Progress Notes (Signed)
? ?BP 138/78 (BP Location: Left Arm)   Pulse 77   Temp (!) 97.4 ?F (36.3 ?C) (Oral)   Ht 5' 7.01" (1.702 m)   Wt 234 lb 12.8 oz (106.5 kg)   SpO2 100%   BMI 36.77 kg/m?   ? ?Subjective:  ? ? Patient ID: [name], female    DOB: 06-07-1966, 56 y.o.   MRN: [id number] ? ?HPI: ?NUMA HEATWOLE is a 56 y.o. female ? ?Chief Complaint  ?Patient presents with  ? Diabetes  ? Hypertension  ?  She wishes testing for POTS  ? Hyperlipidemia  ? Chronic Kidney Disease  ? Pain  ?  Wishes to discuss recent imaging  ? Diarrhea  ? ?RHEUMATOID ARTHRITIS ?Followed by rheumatology with infusions -- last 03/17/22. She had recent visit with neurosurgery on 02/24/22 and MRIs were obtained of cervical and lumbar spine.  Follows with chronic pain clinic and had last visit 01/06/22 -- returns to him April 13th. ?Duration:  chronic ?Pain: yes ?Symmetric: yes ? 8/10 ?Quality: dull, aching, and throbbing ?Frequency: constant ?Context:  fluctuating ?Aggravating factors: movement ?Alleviating factors: Norco ?Relief with NSAIDs?: No NSAIDs Taken ?Treatments attempted:  followed by pain management ? ?CIRRHOSIS: ?She is followed by GI for history of Hep C she went through [place] procedure for this -- last visit was 02/19/22.  She reports ongoing GI issues, including incontinence and esophageal spasms -- they started Baclofen for this on 02/19/22. Last EGD was 12/04/21.   Tried starting Baclofen, but had a reaction -- has hallucinations with this.   ? ?Has been ill the past two weeks, with nausea and diarrhea.  Her husband has been sick as well.  Tried to eat yesterday after infusion, but after eating went immediately to bathroom.  Is able to get fluids in.  Was up all night long last night going to bathroom.  No recent abx use. ?  ?DIABETES ?Last A1c 8.2% with dialysis in July (has missed follow-up since initial visit with PCP in November) -- taking Lantus and Novolog. She prefers vials vs pens -- that way she can see how much she is getting. ?   ?History of ulcer to foot, which she reports is followed by podiatry, last visit 12/11/21. ?Hypoglycemic episodes:no ?Polydipsia/polyuria: no ?Visual disturbance: no ?Chest pain: no ?Paresthesias: yes ?Glucose Monitoring: no ?            Accucheck frequency: daily ?            Fasting glucose: 151 this morning ?            Post prandial: ?            Evening: ?            Before meals: ?Taking Insulin?: yes ?            Long acting insulin: 10 unit Lantus ?            Short acting insulin: Novolog 5 units ?Blood Pressure Monitoring: a few times a week ?Retinal Examination: Up to Date attends every 6 weeks -- glaucoma left eye and retinopathy both eyes -- gets injections == [facility]  ?Foot Exam: Up to Date ?Diabetic Education: Not Completed ?Pneumovax: Up to Date ?Influenza: Up to Date ?Aspirin: no  ?  ?CHRONIC KIDNEY DISEASE ?With ESRD followed by nephrology.  Started on dialysis >7 years ago.  She reports elevations in BP since then, but when in dialysis her BP will drop to lower levels.  Goes to dialysis Monday, Wednesday, Friday. ?CKD status: stable ?Medications renally dose: yes ?Previous renal evaluation: yes ?Pneumovax:  Up to Date ?Influenza Vaccine:  Up to Date  ?  ?HYPERTENSION / HYPERLIPIDEMIA ?Seen by cardiology for initial visit on 11/18/21 -- Metoprolol added to her regimen (is not taking, as it caused bad diarrhea in past and caused it again).  Continues on Diltiazem and Torsemide + no current statin.  Does not recall taking cholesterol medication in past.  She would like to be tested for POTS.  Has not smoked since August 24th, 2022 -- smoked on and off since middle school.  ? ?Tried Hydralazine in past, caused a little dry mouth + got orthostatic with this.  Has taken Clonidine, causes dry mouth.   ? ?Has been on ACE/ARB in past, but has hyperkalemia at baseline -- can not take these.  Diltiazem lowers her heart rate.  She takes Midodrine with her to dialysis due to low BP with dialysis.      ?Satisfied with current treatment? yes ?Duration of hypertension: chronic ?BP monitoring frequency: a few times a week ?BP range: 160/70 range ?BP medication side effects: no ?Past BP meds: multiple ?Duration of hyperlipidemia: chronic ?Past cholesterol medications: none -- Questran at one point for GI ?Medication compliance: good compliance ?Aspirin: no ?Recent stressors: no ?Recurrent headaches: no ?Visual changes: no ?Palpitations: no ?Dyspnea: no ?Chest pain: no ?Lower extremity edema: no ?Dizzy/lightheaded: no  ?  ?ANXIETY/STRESS ?No current medications for mood.  Last visit started Buspar 10 MG BID PRN for anxiety with dialysis. ?Duration:stable ?Anxious mood: yes ?Excessive worrying: yes ?Irritability: yes  ?Sweating: no ?Nausea: no ?Palpitations:no ?Hyperventilation: no ?Panic attacks:  yes ?Agoraphobia: no  ?Obscessions/compulsions: no ?Depressed mood: yes ? ?  03-20-2022  ? 10:34 AM 11/11/2021  ? 10:04 AM 08/21/2021  ?  9:30 AM 07/08/2021  ?  8:40 AM 04/01/2021  ?  9:39 AM  ?Depression screen PHQ 2/9  ?Decreased Interest '3 2 2 '$ 0 0  ?Down, Depressed, Hopeless '1 2 2 '$ 0 0  ?PHQ - 2 Score '4 4 4 '$ 0 0  ?Altered sleeping '3 3 2    '$ ?Tired, decreased energy '3 3 2    '$ ?Change in appetite '3 2 2    '$ ?Feeling bad or failure about yourself  0 3 2    ?Trouble concentrating 0  0    ?Moving slowly or fidgety/restless '1 2 3    '$ ?Suicidal thoughts 0 0 0    ?PHQ-9 Score '14 17 15    '$ ?Difficult doing work/chores  Somewhat difficult Very difficult    ? Anhedonia: no ?Weight changes: no ?Insomnia: yes hard to fall asleep  ?Hypersomnia: no ?Fatigue/loss of energy: yes ?Feelings of worthlessness: no ?Feelings of guilt: yes ?Impaired concentration/indecisiveness: no ?Suicidal ideations: no  ?Crying spells: yes ?Recent Stressors/Life Changes: yes ?  Relationship problems: no ?  Family stress: yes   ?  Financial stress: no  ?  Job stress: no  ?  Recent death/loss: no  ? ?  March 20, 2022  ? 10:34 AM 11/11/2021  ? 10:05 AM 08/21/2021  ?  9:30 AM  07/30/2020  ?  8:21 AM  ?GAD 7 : Generalized Anxiety Score  ?Nervous, Anxious, on Edge 0 '3 3 3  '$ ?Control/stop worrying 0 '3 2 3  '$ ?Worry too much - different things 0 '3 2 3  '$ ?Trouble relaxing 0 '3 3 3  '$ ?Restless 0 '3 3 3  '$ ?Easily annoyed or irritable 0 '3 3 3  '$ ?Afraid -  awful might happen 0 '2 3 3  '$ ?Total GAD 7 Score 0 '20 19 21  '$ ?Anxiety Difficulty  Somewhat difficult Very difficult Extremely difficult  ? ? ?Relevant past medical, surgical, family and social history reviewed and updated as indicated. Interim medical history since our last visit reviewed. ?Allergies and medications reviewed and updated. ? ?Review of Systems  ?Constitutional:  Negative for activity change, appetite change, diaphoresis, fatigue and fever.  ?Respiratory:  Negative for cough, chest tightness, shortness of breath and wheezing.   ?Cardiovascular:  Negative for chest pain, palpitations and leg swelling.  ?Gastrointestinal: Negative.   ?Endocrine: Negative for cold intolerance, heat intolerance, polydipsia, polyphagia and polyuria.  ?Musculoskeletal:  Positive for arthralgias.  ?Neurological: Negative.   ?Psychiatric/Behavioral:  Positive for decreased concentration and sleep disturbance. Negative for self-injury and suicidal ideas. The patient is nervous/anxious.   ? ?Per HPI unless specifically indicated above ? ?   ?Objective:  ?  ?BP 138/78 (BP Location: Left Arm)   Pulse 77   Temp (!) 97.4 ?F (36.3 ?C) (Oral)   Ht 5' 7.01" (1.702 m)   Wt 234 lb 12.8 oz (106.5 kg)   SpO2 100%   BMI 36.77 kg/m?   ?Wt Readings from Last 3 Encounters:  ?03/18/22 234 lb 12.8 oz (106.5 kg)  ?01/06/22 237 lb (107.5 kg)  ?12/04/21 238 lb (108 kg)  ?  ?Physical Exam ?Vitals and nursing note reviewed.  ?Constitutional:   ?   General: She is awake. She is not in acute distress. ?   Appearance: She is well-developed and well-groomed. She is obese. She is not ill-appearing or toxic-appearing.  ?HENT:  ?   Head: Normocephalic.  ?   Right Ear: Hearing, ear canal and  external ear normal.  ?   Left Ear: Hearing, ear canal and external ear normal.  ?Eyes:  ?   General: Lids are normal.     ?   Right eye: No discharge.     ?   Left eye: No discharge.  ?   Conjunctiva/sclera: Conjunctivae normal

## 2022-03-18 NOTE — Assessment & Plan Note (Signed)
Chronic, ongoing without SI/HI.  She does not want maintenance medication.  Would avoid benzos due to her opioid use for pain, discussed at length with her.  At this time will continue Buspar 10 MG BID as needed.  She would benefit from a maintenance regimen in future that is renal dosed, but declines today.   ?

## 2022-03-18 NOTE — Assessment & Plan Note (Signed)
Followed by [facility] every 6 weeks, continue this collaboration. 

## 2022-03-18 NOTE — Assessment & Plan Note (Signed)
Chronic, ongoing.  Continue collaboration with nephrology and current dialysis regimen. Labs today. 

## 2022-03-18 NOTE — Assessment & Plan Note (Signed)
Followed by podiatry, continue this collaboration. ?

## 2022-03-18 NOTE — Assessment & Plan Note (Signed)
Chronic, stable.  Continue to monitor and continue to assess for skin breakdown.   

## 2022-03-18 NOTE — Assessment & Plan Note (Signed)
Chronic, ongoing with recent dialysis A1c 7.5% today, downward trend from previous.  At this time will continue Lantus and Novolog as ordered, due to her diarrhea and decreased appetite will not increase.  Recommend she check blood sugar 3-4 times a day, may benefit from Freestyle in future.  New supplies sent.  Return in 4 weeks.   ?

## 2022-03-18 NOTE — Assessment & Plan Note (Signed)
Chronic, ongoing -- followed by GI, continue this collaboration. ?

## 2022-03-18 NOTE — Assessment & Plan Note (Signed)
Followed by rheumatology and pain management, continue these collaborations.  Recent notes reviewed.  Continue medications as prescribed by them. She is aware all opioids must come from pain management. ?

## 2022-03-19 ENCOUNTER — Ambulatory Visit: Payer: 59 | Admitting: Podiatry

## 2022-03-19 LAB — COMPREHENSIVE METABOLIC PANEL
ALT: 16 IU/L (ref 0–32)
AST: 20 IU/L (ref 0–40)
Albumin/Globulin Ratio: 1 — ABNORMAL LOW (ref 1.2–2.2)
Albumin: 4.2 g/dL (ref 3.8–4.9)
Alkaline Phosphatase: 134 IU/L — ABNORMAL HIGH (ref 44–121)
BUN/Creatinine Ratio: 6 — ABNORMAL LOW (ref 9–23)
BUN: 24 mg/dL (ref 6–24)
Bilirubin Total: 0.4 mg/dL (ref 0.0–1.2)
CO2: 23 mmol/L (ref 20–29)
Calcium: 8.7 mg/dL (ref 8.7–10.2)
Chloride: 99 mmol/L (ref 96–106)
Creatinine, Ser: 3.76 mg/dL — ABNORMAL HIGH (ref 0.57–1.00)
Globulin, Total: 4.1 g/dL (ref 1.5–4.5)
Glucose: 143 mg/dL — ABNORMAL HIGH (ref 70–99)
Potassium: 4.3 mmol/L (ref 3.5–5.2)
Sodium: 138 mmol/L (ref 134–144)
Total Protein: 8.3 g/dL (ref 6.0–8.5)
eGFR: 13 mL/min/{1.73_m2} — ABNORMAL LOW (ref >59)

## 2022-03-19 LAB — VITAMIN B12: Vitamin B-12: 536 pg/mL (ref 232–1245)

## 2022-03-19 LAB — LIPID PANEL W/O CHOL/HDL RATIO
Cholesterol, Total: 175 mg/dL (ref 100–199)
HDL: 39 mg/dL — ABNORMAL LOW (ref >39)
LDL Chol Calc (NIH): 109 mg/dL — ABNORMAL HIGH (ref 0–99)
Triglycerides: 153 mg/dL — ABNORMAL HIGH (ref 0–149)
VLDL Cholesterol Cal: 27 mg/dL (ref 5–40)

## 2022-03-19 LAB — TSH: TSH: 1.83 u[IU]/mL (ref 0.450–4.500)

## 2022-03-19 LAB — VITAMIN D 25 HYDROXY (VIT D DEFICIENCY, FRACTURES): Vit D, 25-Hydroxy: 6.5 ng/mL — ABNORMAL LOW (ref 30.0–100.0)

## 2022-03-19 NOTE — Progress Notes (Signed)
Contacted via [place] ? ? ?Good evening [name], your labs have returned: ?- Kidney function shows baseline kidney disease.  Liver function, AST and ALT, is normal. ?- Cholesterol levels are a little elevated, the LDL (bad cholesterol).  You would benefit at future visit from starting a low dose of Rosuvastatin for stroke and heart protection.  Have you taken this in the past? ?- Thyroid and B12 level normal ?- Vitamin D level is quite low, I would recommend taking Vitamin D3 2000 units daily if this is okay with your kidney doctor.  Any questions? ?Keep being stellar!!  Thank you for allowing me to participate in your care.  I appreciate you. ?Kindest regards, ?Nalaysia Manganiello ? ?

## 2022-03-26 NOTE — Progress Notes (Signed)
Contacted via [place] ? ? ?Good morning Izora Gala, waiting on one more stool result but so far all is negative for infection.  Any questions? ?Keep being awesome!!  Thank you for allowing me to participate in your care.  I appreciate you. ?Kindest regards, ?Rayvion Stumph ?

## 2022-03-27 LAB — CDIFF NAA+O+P+STOOL CULTURE
E coli, Shiga toxin Assay: NEGATIVE
Toxigenic C. Difficile by PCR: NEGATIVE

## 2022-04-02 ENCOUNTER — Ambulatory Visit
Payer: 59 | Attending: Student in an Organized Health Care Education/Training Program | Admitting: Student in an Organized Health Care Education/Training Program

## 2022-04-02 ENCOUNTER — Encounter: Payer: Self-pay | Admitting: Student in an Organized Health Care Education/Training Program

## 2022-04-02 VITALS — BP 162/61 | HR 66 | Temp 97.0°F | Resp 16 | Ht 66.0 in | Wt 233.0 lb

## 2022-04-02 DIAGNOSIS — G894 Chronic pain syndrome: Secondary | ICD-10-CM

## 2022-04-02 DIAGNOSIS — E1142 Type 2 diabetes mellitus with diabetic polyneuropathy: Secondary | ICD-10-CM

## 2022-04-02 DIAGNOSIS — M47812 Spondylosis without myelopathy or radiculopathy, cervical region: Secondary | ICD-10-CM

## 2022-04-02 DIAGNOSIS — M5412 Radiculopathy, cervical region: Secondary | ICD-10-CM | POA: Diagnosis present

## 2022-04-02 MED ORDER — OXYCODONE HCL 5 MG PO TABS: 5.0000 mg | ORAL_TABLET | Freq: Three times a day (TID) | ORAL | 0 refills | Status: DC | PRN

## 2022-04-02 NOTE — Progress Notes (Signed)
PROVIDER NOTE: Information contained herein reflects review and annotations entered in association with encounter. Interpretation of such information and data should be left to medically-trained personnel. Information provided to patient can be located elsewhere in the medical record under "Patient Instructions". Document created using STT-dictation technology, any transcriptional errors that may result from process are unintentional.  ?  ?Patient: [name]  Service Category: E/M  Provider: Gillis Santa, MD  ?DOB: [birth year]  DOS: 04/02/2022  Specialty: Interventional Pain Management  ?MRN: [id number]  Setting: Ambulatory outpatient  PCP: Venita Lick, NP  ?Type: Established Patient    Referring Provider: Venita Lick, NP  ?Location: Office  Delivery: Face-to-face    ? ?HPI  ?[name], a 56 y.o. year old female, is here today because of her Diabetic polyneuropathy associated with type 2 diabetes mellitus ([place]) [E11.42]. [name] primary complain today is Neck Pain ?Last encounter: My last encounter with her was on 03/05/2022. ?Pertinent problems: [name] has ESRD (end stage renal disease) on dialysis [facility]); Type 2 diabetes mellitus with ESRD (end-stage renal disease) ([place]); Chronic bilateral low back pain; DDD (degenerative disc disease), cervical; Spondylosis of cervical region without myelopathy or radiculopathy; PAD (peripheral artery disease) ([place]); Chronic pain syndrome; Diabetic polyneuropathy associated with type 2 diabetes mellitus ([place]); Cervical radiculopathy; Lumbar radiculopathy; GAD (generalized anxiety disorder); and Recurrent major depression in partial remission ([place]) on their pertinent problem list. ?Pain Assessment: Severity of Chronic pain is reported as a 8 /10. Location: Neck  /travels down throughout entire back. Onset: More than a month ago. Quality: Dull, Constant. Timing: Constant. Modifying factor(s): meds. ?Vitals:  height is $RemoveB'5\' 6"'HAghEoSc$  (1.676 m) and weight is 233 lb  (105.7 kg). Her temporal temperature is 97 ?F (36.1 ?C) (abnormal). Her blood pressure is 162/61 (abnormal) and her pulse is 66. Her respiration is 16 and oxygen saturation is 100%.  ? ?Reason for encounter: medication management.  ? ?[name] presents today for medication management.  Since her last visit with me she had a evaluation with Dr. Saintclair Wilson, neurosurgery.  He ordered a MRI of her cervical and lumbar spine for symptoms of dizziness, leg weakness, pain in cervical spine, shoulders as well as numbness in arms and hands.  She states that she has a follow-up evaluation with him on the 18th.  Of note she has done physical therapy for this condition in the past.  She has tried gabapentin and Lyrica in the past and was not able to tolerate these. ? ?She would like to avoid surgery. ? ?Her lumbar MRI also showed multiple cysts in her kidneys, she is followed by nephrology. ? ?Pharmacotherapy Assessment  ?Analgesic: Oxycodone 5 mg q8 hrs prn quantity 75/month; MME equals 22.5   ? ?Monitoring: ?[place] PMP: PDMP reviewed during this encounter.       ?Pharmacotherapy: No side-effects or adverse reactions reported. ?Compliance: No problems identified. ?Effectiveness: Clinically acceptable. ? ?Olivia Patience, RN  04/02/2022  8:30 AM  Sign when Signing Visit ?Nursing Pain Medication Assessment:  ?Safety precautions to be maintained throughout the outpatient stay will include: orient to surroundings, keep bed in low position, maintain call bell within reach at all times, provide assistance with transfer out of bed and ambulation.  ?Medication Inspection Compliance: Pill count conducted under aseptic conditions, in front of the patient. Neither the pills nor the bottle was removed from the patient's sight at any time. Once count was completed pills were immediately returned to the patient in their original bottle. ? ?Medication:  oxycodone 5 mg ?Pill/Patch Count:  20 of 75 pills remain ?Pill/Patch Appearance: Markings consistent with  prescribed medication ?Bottle Appearance: Standard pharmacy container. Clearly labeled. ?Filled Date: 03 / 23 / 2023 ?Last Medication intake:  Yesterday ?  ?  UDS:  ?No results found for: SUMMARY  ? ?ROS  ?Constitutional: Denies any fever or chills ?Gastrointestinal: No reported hemesis, hematochezia, vomiting, or acute GI distress ?Musculoskeletal: Denies any acute onset joint swelling, redness, loss of ROM, or weakness ?Neurological: No reported episodes of acute onset apraxia, aphasia, dysarthria, agnosia, amnesia, paralysis, loss of coordination, or loss of consciousness ? ?Medication Review  ?Abatacept, INSULIN SYRINGE .5CC/29G, brimonidine, busPIRone, diltiazem, dorzolamidel-timolol, glucose blood, heparin, insulin aspart, insulin glargine, latanoprost, levocetirizine, midodrine, omeprazole, oxyCODONE, promethazine, and torsemide ? ?History Review  ?Allergy: [name] is allergic to cinnamon, garlic, onion, tylenol [acetaminophen], losartan potassium, ciprofloxacin, ginger, and prednisone. ?Drug: [name]  reports no history of drug use. ?Alcohol:  reports no history of alcohol use. ?Tobacco:  reports that she quit smoking about 7 months ago. Her smoking use included cigarettes. She has a 5.00 pack-year smoking history. She has never used smokeless tobacco. ?Social: [name]  reports that she quit smoking about 7 months ago. Her smoking use included cigarettes. She has a 5.00 pack-year smoking history. She has never used smokeless tobacco. She reports that she does not drink alcohol and does not use drugs. ?Medical:  has a past medical history of Allergy, Anemia, Anxiety, Arthritis, Ataxia, COPD (chronic obstructive pulmonary disease) ([place]), Depression, Diabetes mellitus with complication ([place]), Dialysis patient ([place]), ESRD on hemodialysis ([place]), Fistula, Hepatitis (2003), Hiatal hernia, Hypercholesterolemia, Hypertension, Migraine, Neuropathy, diabetic ([place]), Non-alcoholic cirrhosis ([place]), Peripheral  vascular disease ([place]), Pneumonia (2015), Psoriasis, Renal insufficiency, Tobacco dependence, and Wears dentures. ?Surgical: Ms. Stege  has a past surgical history that includes Tonsillectomy; rt. tubal and ovary removed; Cholecystectomy; Cardiac catheterization (N/A, 07/25/2015); Cardiac catheterization (Left, 07/25/2015); Cardiac catheterization (Left, 10/07/2015); Cardiac catheterization (N/A, 10/07/2015); Cardiac catheterization (10/07/2015); Cardiac catheterization (N/A, 12/17/2015); Incision and drainage abscess (N/A, 07/16/2016); cyst removed  from left hand (Left, 1989); AV fistula placement (Left, 11/2014); Cardiac catheterization (N/A, 01/11/2017); Cardiac catheterization (N/A, 01/11/2017); DIALYSIS/PERMA CATHETER INSERTION (N/A, 05/20/2017); Cesarean section; Colonoscopy (N/A, 09/22/2017); Esophagogastroduodenoscopy (N/A, 09/22/2017); polypectomy (09/22/2017); A/V SHUNT INTERVENTION (N/A, 12/16/2017); STENT PLACEMENT VASCULAR ([facility] HX) (Left, 03/2018); Upper Extremity Angiography (Left, 09/29/2019); Aqueous shunt (Left, 12/20/2019); Colonoscopy with propofol (N/A, 04/16/2020); A/V Fistulagram (Left, 04/23/2020); and Esophagogastroduodenoscopy (egd) with propofol (N/A, 12/04/2021). ?Family: family history includes Heart disease in her mother; Hypertension in her father and mother; Skin cancer in her father. ? ?Laboratory Chemistry Profile  ? ?Renal ?Lab Results  ?Component Value Date  ? BUN 24 03/18/2022  ? CREATININE 3.76 (H) 03/18/2022  ? BCR 6 (L) 03/18/2022  ? GFRAA 6 (L) 05/01/2019  ? GFRNONAA 6 (L) 05/01/2019  ?  Hepatic ?Lab Results  ?Component Value Date  ? AST 20 03/18/2022  ? ALT 16 03/18/2022  ? ALBUMIN 4.2 03/18/2022  ? ALKPHOS 134 (H) 03/18/2022  ? HCVAB >11.0 (H) 12/16/2016  ? LIPASE 21 04/30/2019  ?  ?Electrolytes ?Lab Results  ?Component Value Date  ? NA 138 03/18/2022  ? K 4.3 03/18/2022  ? CL 99 03/18/2022  ? CALCIUM 8.7 03/18/2022  ? MG 2.1 11/11/2021  ? PHOS 4.2 05/01/2019  ?  Bone ?Lab  Results  ?Component Value Date  ? VD25OH 6.5 (L) 03/18/2022  ?  ?Inflammation (CRP: Acute Phase) (ESR: Chronic Phase) ?Lab Results  ?Component Value Date  ?  CRP <0.8 12/16/2016  ? ESRSEDRATE 117 (H) 10/23/2

## 2022-04-02 NOTE — Progress Notes (Signed)
Nursing Pain Medication Assessment:  ?Safety precautions to be maintained throughout the outpatient stay will include: orient to surroundings, keep bed in low position, maintain call bell within reach at all times, provide assistance with transfer out of bed and ambulation.  ?Medication Inspection Compliance: Pill count conducted under aseptic conditions, in front of the patient. Neither the pills nor the bottle was removed from the patient's sight at any time. Once count was completed pills were immediately returned to the patient in their original bottle. ? ?Medication:  oxycodone 5 mg ?Pill/Patch Count:  20 of 75 pills remain ?Pill/Patch Appearance: Markings consistent with prescribed medication ?Bottle Appearance: Standard pharmacy container. Clearly labeled. ?Filled Date: 03 / 23 / 2023 ?Last Medication intake:  Yesterday ?

## 2022-04-12 NOTE — Patient Instructions (Signed)
Diarrhea, Adult ?Diarrhea is when you pass loose and watery poop (stool) often. Diarrhea can make you feel weak and cause you to lose water in your body (get dehydrated). Losing water in your body can cause you to: ?Feel tired and thirsty. ?Have a dry mouth. ?Go pee (urinate) less often. ?Diarrhea often lasts 2-3 days. However, it can last longer if it is a sign of something more serious. It is important to treat your diarrhea as told by your doctor. ?Follow these instructions at home: ?Eating and drinking ? ?  ? ?Follow these instructions as told by your doctor: ?Take an ORS (oral rehydration solution). This is a drink that helps you replace fluids and minerals your body lost. It is sold at pharmacies and stores. ?Drink plenty of fluids, such as: ?Water. ?Ice chips. ?Diluted fruit juice. ?Low-calorie sports drinks. ?Milk, if you want. ?Avoid drinking fluids that have a lot of sugar or caffeine in them. ?Eat bland, easy-to-digest foods in small amounts as you are able. These foods include: ?Bananas. ?Applesauce. ?Rice. ?Low-fat (lean) meats. ?Toast. ?Crackers. ?Avoid alcohol. ?Avoid spicy or fatty foods. ? ?Medicines ?Take over-the-counter and prescription medicines only as told by your doctor. ?If you were prescribed an antibiotic medicine, take it as told by your doctor. Do not stop using the antibiotic even if you start to feel better. ?General instructions ? ?Wash your hands often using soap and water. If soap and water are not available, use a hand sanitizer. Others in your home should wash their hands as well. Hands should be washed: ?After using the toilet or changing a diaper. ?Before preparing, cooking, or serving food. ?While caring for a sick person. ?While visiting someone in a hospital. ?Drink enough fluid to keep your pee (urine) pale yellow. ?Rest at home while you get better. ?Take a warm bath to help with any burning or pain from having diarrhea. ?Watch your condition for any changes. ?Keep all  follow-up visits as told by your doctor. This is important. ?Contact a doctor if: ?You have a fever. ?Your diarrhea gets worse. ?You have new symptoms. ?You cannot keep fluids down. ?You feel light-headed or dizzy. ?You have a headache. ?You have muscle cramps. ?Get help right away if: ?You have chest pain. ?You feel very weak or you pass out (faint). ?You have bloody or black poop or poop that looks like tar. ?You have very bad pain, cramping, or bloating in your belly (abdomen). ?You have trouble breathing or you are breathing very quickly. ?Your heart is beating very quickly. ?Your skin feels cold and clammy. ?You feel confused. ?You have signs of losing too much water in your body, such as: ?Dark pee, very little pee, or no pee. ?Cracked lips. ?Dry mouth. ?Sunken eyes. ?Sleepiness. ?Weakness. ?Summary ?Diarrhea is when you pass loose and watery poop (stool) often. ?Diarrhea can make you feel weak and cause you to lose water in your body (get dehydrated). ?Take an ORS (oral rehydration solution). This is a drink that is sold at pharmacies and stores. ?Eat bland, easy-to-digest foods in small amounts as you are able. ?Contact a doctor if your condition gets worse. Get help right away if you have signs that you have lost too much water in your body. ?This information is not intended to replace advice given to you by your health care provider. Make sure you discuss any questions you have with your health care provider. ?Document Revised: 06/18/2021 Document Reviewed: 06/18/2021 ?Elsevier Patient Education ? 2023 [employer] ? ?

## 2022-04-16 ENCOUNTER — Other Ambulatory Visit: Payer: Self-pay | Admitting: *Deleted

## 2022-04-16 ENCOUNTER — Encounter: Payer: Self-pay | Admitting: Nurse Practitioner

## 2022-04-16 ENCOUNTER — Ambulatory Visit (INDEPENDENT_AMBULATORY_CARE_PROVIDER_SITE_OTHER): Payer: 59 | Admitting: Nurse Practitioner

## 2022-04-16 ENCOUNTER — Telehealth: Payer: Self-pay | Admitting: Student in an Organized Health Care Education/Training Program

## 2022-04-16 VITALS — BP 138/70 | HR 73 | Temp 97.5°F | Resp 18 | Wt 238.4 lb

## 2022-04-16 VITALS — BP 179/77 | HR 73 | Ht 66.5 in | Wt 233.0 lb

## 2022-04-16 DIAGNOSIS — E782 Mixed hyperlipidemia: Secondary | ICD-10-CM | POA: Diagnosis not present

## 2022-04-16 DIAGNOSIS — A09 Infectious gastroenteritis and colitis, unspecified: Secondary | ICD-10-CM | POA: Diagnosis not present

## 2022-04-16 DIAGNOSIS — I200 Unstable angina: Secondary | ICD-10-CM | POA: Diagnosis not present

## 2022-04-16 DIAGNOSIS — I152 Hypertension secondary to endocrine disorders: Secondary | ICD-10-CM

## 2022-04-16 DIAGNOSIS — M5412 Radiculopathy, cervical region: Secondary | ICD-10-CM

## 2022-04-16 DIAGNOSIS — Z794 Long term (current) use of insulin: Secondary | ICD-10-CM

## 2022-04-16 DIAGNOSIS — E1159 Type 2 diabetes mellitus with other circulatory complications: Secondary | ICD-10-CM

## 2022-04-16 DIAGNOSIS — N186 End stage renal disease: Secondary | ICD-10-CM | POA: Diagnosis not present

## 2022-04-16 DIAGNOSIS — E559 Vitamin D deficiency, unspecified: Secondary | ICD-10-CM | POA: Diagnosis not present

## 2022-04-16 DIAGNOSIS — Z992 Dependence on renal dialysis: Secondary | ICD-10-CM

## 2022-04-16 DIAGNOSIS — I10 Essential (primary) hypertension: Secondary | ICD-10-CM

## 2022-04-16 DIAGNOSIS — E1142 Type 2 diabetes mellitus with diabetic polyneuropathy: Secondary | ICD-10-CM

## 2022-04-16 DIAGNOSIS — E1122 Type 2 diabetes mellitus with diabetic chronic kidney disease: Secondary | ICD-10-CM

## 2022-04-16 DIAGNOSIS — G894 Chronic pain syndrome: Secondary | ICD-10-CM

## 2022-04-16 MED ORDER — DILTIAZEM HCL 90 MG PO TABS: 90.0000 mg | ORAL_TABLET | Freq: Four times a day (QID) | ORAL | 12 refills | Status: DC

## 2022-04-16 MED ORDER — OXYCODONE HCL 5 MG PO TABS: 5.0000 mg | ORAL_TABLET | Freq: Three times a day (TID) | ORAL | 0 refills | Status: DC | PRN

## 2022-04-16 MED ORDER — CARVEDILOL 3.125 MG PO TABS: 3.1250 mg | ORAL_TABLET | Freq: Two times a day (BID) | ORAL | 3 refills | Status: DC

## 2022-04-16 MED ORDER — CHOLECALCIFEROL 1.25 MG (50000 UT) PO TABS: 1.0000 | ORAL_TABLET | ORAL | 4 refills | Status: DC

## 2022-04-16 MED ORDER — DILTIAZEM HCL ER 90 MG PO CP12: 90.0000 mg | ORAL_CAPSULE | Freq: Four times a day (QID) | ORAL | 12 refills | Status: DC

## 2022-04-16 NOTE — Addendum Note (Signed)
Addended by: Marnee Guarneri T on: 04/16/2022 02:08 PM ? ? Modules accepted: Orders ? ?

## 2022-04-16 NOTE — Telephone Encounter (Signed)
Patient called stated that her pharmacy is out of the meds that she needs. Patients wants to know if her med can [facility] send [facility] [place]. Please give PT a call thanks. ?

## 2022-04-16 NOTE — Telephone Encounter (Signed)
[facility] [place] [facility] She was told to call us back and let us know so she can get meds there. Please call to confirm  ?

## 2022-04-16 NOTE — Telephone Encounter (Signed)
Message left that oxycodone has been sent to [facility] [place] ?

## 2022-04-16 NOTE — Patient Instructions (Addendum)
Medication Instructions:  ?Your physician has recommended you make the following change in your medication:  ? ?START Carvedilol 3.125 mg twice a day ? ?*If you need a refill on your cardiac medications before your next appointment, please call your pharmacy* ? ? ?Lab Work: ?None ? ?If you have labs (blood work) drawn today and your tests are completely normal, you will receive your results only by: ?MyChart Message (if you have MyChart) OR ?A paper copy in the mail ?If you have any lab test that is abnormal or we need to change your treatment, we will call you to review the results. ? ? ?Testing/Procedures: ?[facility] MYOVIEW ? ?Your caregiver has ordered a Stress Test with nuclear imaging. The purpose of this test is to evaluate the blood supply to your heart muscle. This procedure is referred to as a "Non-Invasive Stress Test." This is because other than having an IV started in your vein, nothing is inserted or "invades" your body. Cardiac stress tests are done to find areas of poor blood flow to the heart by determining the extent of coronary artery disease (CAD). Some patients exercise on a treadmill, which naturally increases the blood flow to your heart, while others who are  unable to walk on a treadmill due to physical limitations have a pharmacologic/chemical stress agent called Lexiscan . This medicine will mimic walking on a treadmill by temporarily increasing your coronary blood flow.  ? ?Please note: these test may take anywhere between 2-4 hours to complete ? ?PLEASE REPORT TO [facility]ICAL MALL ENTRANCE  ?THE VOLUNTEERS AT THE FIRST DESK WILL DIRECT YOU WHERE TO GO ? ?Date of Procedure:__________________ ? ?Arrival Time for Procedure:_______________________ ? ?Instructions regarding medication:  ? ?_XX__ : Hold diabetes medication morning of procedure ? ? ?_XX___:  Hold other medications as follows: Torsemide the morning of your procedure.  ? ?PLEASE NOTIFY THE OFFICE AT LEAST 66 HOURS IN ADVANCE IF YOU  ARE UNABLE TO KEEP YOUR APPOINTMENT.  [phone] ?AND  ?PLEASE NOTIFY NUCLEAR MEDICINE AT [facility] AT LEAST 47 HOURS IN ADVANCE IF YOU ARE UNABLE TO KEEP YOUR APPOINTMENT. [phone] ? ?How to prepare for your Myoview test: ? ?Do not eat or drink after midnight ?No caffeine for 24 hours prior to test ?No smoking 24 hours prior to test. ?Your medication may be taken with water.  If your doctor stopped a medication because of this test, do not take that medication. ?Ladies, please do not wear dresses.  Skirts or pants are appropriate. Please wear a short sleeve shirt. ?No perfume, cologne or lotion. ?Wear comfortable walking shoes. No heels! ? ? ? ?Follow-Up: ?At [facility], you and your health needs are our priority.  As part of our continuing mission to provide you with exceptional heart care, we have created designated Provider Care Teams.  These Care Teams include your primary Cardiologist (physician) and Advanced Practice Providers (APPs -  Physician Assistants and Nurse Practitioners) who all work together to provide you with the care you need, when you need it. ? ? ?Your next appointment:   ?1 month(s) ? ?The format for your next appointment:   ?In Person ? ?Provider:   ?Kathlyn [place], MD or Murray Hodgkins, NP  ? ? ? ? ? ?Important Information About Sugar ? ? ? ? ?  ?

## 2022-04-16 NOTE — Assessment & Plan Note (Signed)
Noted on recent labs, has not started supplement.  Will resend and recommend she take this weekly.  Recheck future visit. ?

## 2022-04-16 NOTE — Assessment & Plan Note (Addendum)
Chronic, ongoing with level above goal in office initially, but improved on manual repeat.  She does run low during dialysis -- continue Midodrine as needed in dialysis.  New referral to [facility] cardiology per patient request, she would like POTS assessment.  Can not take ACE/ARB due to underlying hyperkalemia.  Continue Diltiazem and Torsemide at current doses.  Has had multiple adverse affects to BP medications.  Focus on DASH diet at home.  Check BP daily and document for provider visits.  Labs up to date.  Sees cardiology this afternoon. ?

## 2022-04-16 NOTE — Progress Notes (Signed)
? ?BP 138/70 (BP Location: Right Arm, Patient Position: Sitting, Cuff Size: Large)   Pulse 73   Temp (!) 97.5 ?F (36.4 ?C) (Oral)   Resp 18   Wt 238 lb 6.4 oz (108.1 kg)   SpO2 97%   BMI 38.48 kg/m?   ? ?Subjective:  ? ? Patient ID: [name], female    DOB: 1966/04/08, 56 y.o.   MRN: [id number] ? ?HPI: ?[name] is a 56 y.o. female ? ?Chief Complaint  ?Patient presents with  ? Diarrhea  ?  Patient is here to follow up on Diarrhea. Patient states "the diarrhea is done and gone."  ? Medication Management  ?  Patient states she would like for the provider to take a look at her prescription bottle as she wasn't able to receive a refill from her pharmacy the last time she went. (Diltiazem). Patient states she received a 30 MG prescription and says she has not had the 90 MG prescription since last month and she has been making up with the 30 MG.  ? ?She reports she needs her Diltiazem prescription needs changed as her nephrologist ordered 90 MG QID (verified on her bottle).  Has not obtained Vitamin D prescription ordered last visit, level was 6.5. ? ?DIARRHEA ?Follow-up today for diarrhea, which was reported at visit on 03/18/22.  Had been ill for two weeks with nausea and diarrhea, husband had been sick as well. Stool sample returned with no infectious findings.  Currently diarrhea is gone and improved.   ?Duration:weeks ?Status: better ?Treatments attempted: diet changes and rest ?Fever: no ?Nausea: yes ?Vomiting: no ?Weight loss: no ?Decreased appetite: no ?Diarrhea: no ?Constipation:  a little ?Blood in stool: no ?Heartburn: no ?Jaundice: no ?Rash: no ?Dysuria/urinary frequency: no ?Hematuria: no ?History of sexually transmitted disease: no ?Recurrent NSAID use: no  ? ?Relevant past medical, surgical, family and social history reviewed and updated as indicated. Interim medical history since our last visit reviewed. ?Allergies and medications reviewed and updated. ? ?Review of Systems  ?Constitutional:   Negative for activity change, appetite change, diaphoresis, fatigue and fever.  ?Respiratory:  Negative for cough, chest tightness, shortness of breath and wheezing.   ?Cardiovascular:  Negative for chest pain, palpitations and leg swelling.  ?Gastrointestinal: Negative.   ?Endocrine: Negative for cold intolerance, heat intolerance, polydipsia, polyphagia and polyuria.  ?Neurological: Negative.   ?Psychiatric/Behavioral: Negative.    ? ?Per HPI unless specifically indicated above ? ?   ?Objective:  ?  ?BP 138/70 (BP Location: Right Arm, Patient Position: Sitting, Cuff Size: Large)   Pulse 73   Temp (!) 97.5 ?F (36.4 ?C) (Oral)   Resp 18   Wt 238 lb 6.4 oz (108.1 kg)   SpO2 97%   BMI 38.48 kg/m?   ?Wt Readings from Last 3 Encounters:  ?04/16/22 238 lb 6.4 oz (108.1 kg)  ?04/02/22 233 lb (105.7 kg)  ?03/18/22 234 lb 12.8 oz (106.5 kg)  ?  ?Physical Exam ?Vitals and nursing note reviewed.  ?Constitutional:   ?   General: She is awake. She is not in acute distress. ?   Appearance: She is well-developed and well-groomed. She is obese. She is not ill-appearing or toxic-appearing.  ?HENT:  ?   Head: Normocephalic.  ?   Right Ear: Hearing, ear canal and external ear normal.  ?   Left Ear: Hearing, ear canal and external ear normal.  ?Eyes:  ?   General: Lids are normal.     ?  Right eye: No discharge.     ?   Left eye: No discharge.  ?   Conjunctiva/sclera: Conjunctivae normal.  ?   Pupils: Pupils are equal, round, and reactive to light.  ?Neck:  ?   Thyroid: No thyromegaly.  ?   Vascular: No carotid bruit.  ?Cardiovascular:  ?   Rate and Rhythm: Normal rate and regular rhythm.  ?   Heart sounds: Normal heart sounds. No murmur heard. ?  No gallop.  ?   Arteriovenous access: Left arteriovenous access is present. ?Pulmonary:  ?   Effort: Pulmonary effort is normal. No accessory muscle usage or respiratory distress.  ?   Breath sounds: Normal breath sounds.  ?Abdominal:  ?   General: Bowel sounds are normal. There is no  distension.  ?   Palpations: Abdomen is soft. There is no hepatomegaly.  ?   Tenderness: There is no abdominal tenderness.  ?Musculoskeletal:  ?   Cervical back: Normal range of motion and neck supple.  ?   Right lower leg: No edema.  ?   Left lower leg: No edema.  ?Lymphadenopathy:  ?   Cervical: No cervical adenopathy.  ?Skin: ?   General: Skin is warm and dry.  ?Neurological:  ?   Mental Status: She is alert and oriented to person, place, and time.  ?Psychiatric:     ?   Attention and Perception: Attention normal.     ?   Mood and Affect: Mood normal.     ?   Speech: Speech normal.     ?   Behavior: Behavior normal. Behavior is cooperative.     ?   Thought Content: Thought content normal.  ? ?Results for orders placed or performed in visit on 03/18/22  ?Cdiff NAA+O+P+Stool Culture  ? Specimen: Stool  ? ST  ?Result Value Ref Range  ? Salmonella/Shigella Screen Final report   ? Stool Culture result 1 (RSASHR) Comment   ? Campylobacter Culture Final report   ? Stool Culture result 1 (CMPCXR) Comment   ? E coli, Shiga toxin Assay Negative Negative  ? OVA + PARASITE EXAM Final report   ? O&P result 1 Comment   ? Toxigenic C. Difficile by PCR Negative Negative  ?Bayer DCA Hb A1c Waived  ?Result Value Ref Range  ? HB A1C (BAYER DCA - WAIVED) 7.5 (H) 4.8 - 5.6 %  ?Comprehensive metabolic panel  ?Result Value Ref Range  ? Glucose 143 (H) 70 - 99 mg/dL  ? BUN 24 6 - 24 mg/dL  ? Creatinine, Ser 3.76 (H) 0.57 - 1.00 mg/dL  ? eGFR 13 (L) >59 mL/min/1.73  ? BUN/Creatinine Ratio 6 (L) 9 - 23  ? Sodium 138 134 - 144 mmol/L  ? Potassium 4.3 3.5 - 5.2 mmol/L  ? Chloride 99 96 - 106 mmol/L  ? CO2 23 20 - 29 mmol/L  ? Calcium 8.7 8.7 - 10.2 mg/dL  ? Total Protein 8.3 6.0 - 8.5 g/dL  ? Albumin 4.2 3.8 - 4.9 g/dL  ? Globulin, Total 4.1 1.5 - 4.5 g/dL  ? Albumin/Globulin Ratio 1.0 (L) 1.2 - 2.2  ? Bilirubin Total 0.4 0.0 - 1.2 mg/dL  ? Alkaline Phosphatase 134 (H) 44 - 121 IU/L  ? AST 20 0 - 40 IU/L  ? ALT 16 0 - 32 IU/L  ?Lipid Panel  w/o Chol/HDL Ratio  ?Result Value Ref Range  ? Cholesterol, Total 175 100 - 199 mg/dL  ? Triglycerides 153 (H) 0 - 149  mg/dL  ? HDL 39 (L) >39 mg/dL  ? VLDL Cholesterol Cal 27 5 - 40 mg/dL  ? LDL Chol Calc (NIH) 109 (H) 0 - 99 mg/dL  ?TSH  ?Result Value Ref Range  ? TSH 1.830 0.450 - 4.500 uIU/mL  ?VITAMIN D 25 Hydroxy (Vit-D Deficiency, Fractures)  ?Result Value Ref Range  ? Vit D, 25-Hydroxy 6.5 (L) 30.0 - 100.0 ng/mL  ?Vitamin B12  ?Result Value Ref Range  ? Vitamin B-12 536 232 - 1,245 pg/mL  ? ?   ?Assessment & Plan:  ? ?Problem List Items Addressed This Visit   ? ?  ? Cardiovascular and Mediastinum  ? Hypertension associated with diabetes ([place]) - Primary  ?  Chronic, ongoing with level above goal in office initially, but improved on manual repeat.  She does run low during dialysis -- continue Midodrine as needed in dialysis.  New referral to [facility] cardiology per patient request, she would like POTS assessment.  Can not take ACE/ARB due to underlying hyperkalemia.  Continue Diltiazem and Torsemide at current doses.  Has had multiple adverse affects to BP medications.  Focus on DASH diet at home.  Check BP daily and document for provider visits.  Labs up to date.  Sees cardiology this afternoon. ? ?  ?  ? Relevant Medications  ? diltiazem (CARDIZEM SR) 90 MG 12 hr capsule  ?  ? Other  ? Vitamin D deficiency  ?  Noted on recent labs, has not started supplement.  Will resend and recommend she take this weekly.  Recheck future visit. ? ?  ?  ? ?Other Visit Diagnoses   ? ? Diarrhea of infectious origin      ? Improved at this time, continue to monitor.  ? ?  ?  ? ?Follow up plan: ?Return in about 9 weeks (around 06/18/2022) for T2DM, HTN/HLD, CIRRHOSIS, VIT D, MOOD. ? ? ? ? ? ?

## 2022-04-16 NOTE — Progress Notes (Signed)
? ?Cardiology Clinic Note  ? ?Patient Name: [name] ?Date of Encounter: 04/16/2022 ? ?Primary Care Provider:  Venita Lick, NP ?Primary Cardiologist:  Kathlyn Sacramento, MD ? ?Patient Profile  ?  ?56 year old female with a history of end-stage renal disease on hemodialysis, COPD, prior tobacco abuse, diabetes mellitus (diagnosed 1989), hypertension, migraines, hepatitis C, hyperlipidemia, peripheral vascular disease, anxiety, and depression, who presents to reestablish care related to chest pain and hypertension. ? ?Past Medical History  ?  ?Past Medical History:  ?Diagnosis Date  ? Allergy   ? Anemia   ? Anxiety   ? worse when not at home - bowel incontinence  ? Arthritis   ? Ataxia   ? Chest pain   ? a. 10/2016 MV: EF 57%, no ischemia/infarct.  ? COPD (chronic obstructive pulmonary disease) ([place])   ? Depression   ? Diabetes mellitus with complication ([place])   ? Diastolic dysfunction   ? a. 06/2016 Echo: EF 55-60%; b. 05/2017 Echo: EF 50-55%; c. 05/2018 Echo: EF 55-60%, no rwma, GrI DD, mild AI, mild-mod MR, mod dil LA. PASP 72mHg.  ? ESRD on hemodialysis ([place]   ? MWF dialysis  ? Fistula   ? left upper arm  ? Hepatitis 2003  ? Hep C  ? Hiatal hernia   ? Hypercholesterolemia   ? Hypertension   ? Migraine   ? Neuropathy, diabetic ([place]   ? lower legs  ? Non-alcoholic cirrhosis ([place]   ? Palpitations   ? a. 06/2018 Zio: RSR, 74, rare PACs and PVCs.  No significant arrhythmia.  ? Peripheral vascular disease ([place]   ? Pneumonia 2015  ? Psoriasis   ? Tobacco dependence   ? Wears dentures   ? full upper, partial lower  ? ?Past Surgical History:  ?Procedure Laterality Date  ? A/V FISTULAGRAM Left 04/23/2020  ? Procedure: A/V FISTULAGRAM;  Surgeon: SKatha Cabal MD;  [facility];  Service: Cardiovascular;  Laterality: Left;  ? A/V SHUNT INTERVENTION N/A 12/16/2017  ? Procedure: A/V SHUNT INTERVENTION;  Surgeon: DAlgernon Huxley MD;  [facility];  Service: Cardiovascular;   Laterality: N/A;  ? AQUEOUS SHUNT Left 12/20/2019  ? Procedure: AHMED TUBE SHUNT WITH TUTOPLAST AND AC WASHOUT LEFT DIABETIC;  Surgeon: BLeandrew Koyanagi MD;  [place] [facility]  Service: Ophthalmology;  Laterality: Left;  Diabetic - insulin  ? AV FISTULA PLACEMENT Left 11/2014  ? CESAREAN SECTION    ? CHOLECYSTECTOMY    ? COLONOSCOPY N/A 09/22/2017  ? Procedure: COLONOSCOPY;  Surgeon: VLin Landsman MD;  [place] [facility]  Service: Endoscopy;  Laterality: N/A;  ? COLONOSCOPY WITH PROPOFOL N/A 04/16/2020  ? Procedure: COLONOSCOPY WITH PROPOFOL;  Surgeon: WLucilla Lame MD;  [facility];  Service: Endoscopy;  Laterality: N/A;  Priority 4  ? cyst removed  from left hand Left 1989  ? DIALYSIS/PERMA CATHETER INSERTION N/A 05/20/2017  ? Procedure: Dialysis/Perma Catheter Insertion and fistulagram/LUE angiogram;  Surgeon: DAlgernon Huxley MD;  [facility];  Service: Cardiovascular;  Laterality: N/A;  ? ESOPHAGOGASTRODUODENOSCOPY N/A 09/22/2017  ? Procedure: ESOPHAGOGASTRODUODENOSCOPY (EGD);  Surgeon: VLin Landsman MD;  [facility] [facility]  Service: Endoscopy;  Laterality: N/A;  ? ESOPHAGOGASTRODUODENOSCOPY (EGD) WITH PROPOFOL N/A 12/04/2021  ? Procedure: ESOPHAGOGASTRODUODENOSCOPY (EGD) WITH PROPOFOL;  Surgeon: WLucilla Lame MD;  [facility];  Service: Endoscopy;  Laterality: N/A;  ? INCISION AND DRAINAGE ABSCESS N/A 07/16/2016  ? Procedure: INCISION AND DRAINAGE  ABSCESS;  Surgeon: Carloyn Manner, MD;  Location: [facility];  Service: ENT;  Laterality: N/A;  ? PERIPHERAL VASCULAR CATHETERIZATION N/A 07/25/2015  ? Procedure: A/V Shuntogram/Fistulagram;  Surgeon: Algernon Huxley, MD;  Location: [place] [facility];  Service: Cardiovascular;  Laterality: N/A;  ? PERIPHERAL VASCULAR CATHETERIZATION Left 07/25/2015  ? Procedure: A/V Shunt Intervention;  Surgeon: Algernon Huxley, MD;  Location: [place] [facility];  Service: Cardiovascular;   Laterality: Left;  ? PERIPHERAL VASCULAR CATHETERIZATION Left 10/07/2015  ? Procedure: A/V Shuntogram/Fistulagram;  Surgeon: Algernon Huxley, MD;  Location: [place] [facility];  Service: Cardiovascular;  Laterality: Left;  ? PERIPHERAL VASCULAR CATHETERIZATION N/A 10/07/2015  ? Procedure: A/V Shunt Intervention;  Surgeon: Algernon Huxley, MD;  Location: [place] [facility];  Service: Cardiovascular;  Laterality: N/A;  ? PERIPHERAL VASCULAR CATHETERIZATION  10/07/2015  ? Procedure: Dialysis/Perma Catheter Insertion;  Surgeon: Algernon Huxley, MD;  Location: [place] [facility];  Service: Cardiovascular;;  ? PERIPHERAL VASCULAR CATHETERIZATION N/A 12/17/2015  ? Procedure: Dialysis/Perma Catheter Removal;  Surgeon: Katha Cabal, MD;  Location: [place] [facility];  Service: Cardiovascular;  Laterality: N/A;  ? PERIPHERAL VASCULAR CATHETERIZATION N/A 01/11/2017  ? Procedure: Visceral Angiography;  Surgeon: Algernon Huxley, MD;  Location: [place] [facility];  Service: Cardiovascular;  Laterality: N/A;  ? PERIPHERAL VASCULAR CATHETERIZATION N/A 01/11/2017  ? Procedure: Visceral Artery Intervention;  Surgeon: Algernon Huxley, MD;  Location: [place] [facility];  Service: Cardiovascular;  Laterality: N/A;  ? POLYPECTOMY  09/22/2017  ? Procedure: POLYPECTOMY;  Surgeon: Lin Landsman, MD;  Location: [place];  Service: Endoscopy;;  ? rt. tubal and ovary removed    ? STENT PLACEMENT VASCULAR ([facility] HX) Left 03/2018  ? Performed at [facility] using EV3 prot?g? GPS stent graph [id number] lot [id number]  ? TONSILLECTOMY    ? UPPER EXTREMITY ANGIOGRAPHY Left 09/29/2019  ? Procedure: UPPER EXTREMITY ANGIOGRAPHY;  Surgeon: Katha Cabal, MD;  Location: [place] [facility];  Service: Cardiovascular;  Laterality: Left;  ? ? ?Allergies ? ?Allergies  ?Allergen Reactions  ? Cinnamon Anaphylaxis  ? Garlic Anaphylaxis and Hives  ? Onion Hives and Swelling  ? Tylenol [Acetaminophen] Anaphylaxis  ? Losartan  Potassium   ?  Other reaction(s): Other (See Comments)  ? Amlodipine   ? Ciprofloxacin Diarrhea  ? Clonidine Derivatives   ?  Dry mouth; somnolence.  ? Ginger   ? Hydralazine   ?  Excessive thirst ?  ? Lisinopril   ? Metoprolol   ?  GI upset  ? Prednisone Other (See Comments)  ?  Vaginal blisters & oral blisters ?  ? ? ?History of Present Illness  ?  ?56 year old female with the above complex past medical history including end-stage renal disease on hemodialysis, COPD, tobacco abuse, diabetes, hypertension, migraines, hepatitis C, hyperlipidemia, peripheral vascular disease, anxiety, and depression.  She was previously evaluated in 2017 for atypical chest pain underwent stress echocardiogram, which showed normal LV function.  She was later referred to Dr. Fletcher Anon in June 2019 for dizziness and presyncope, along with palpitations.  Event monitoring was undertaken, and she was only noted to have rare PACs and PVCs with no significant arrhythmias.  Follow-up echo in June 2019, showed normal LV function with grade 1 diastolic dysfunction.  She was subsequently switched from long-acting to short acting diltiazem with improvement in dizziness.   ? ?Ms. Carrigg was last seen in cardiology clinic in February 2020.  Over  the past 3 years, she has remained on Monday, Wednesday, Friday dialysis, and has continued to have issues with significant hypertension outside of dialysis and hypotension during dialysis.  She notes that pressures typically get down into the 70s during dialysis.  She does have a prescription for as needed midodrine, though she rarely has to take it.  Her diltiazem dose has been titrated to 90 mg 4 times a day.  She still feels like she tolerates this better than she did long-acting diltiazem.  Pressures at home are typically in the 170s to 200 range.  In November 2022, she was seen by Dr. Saralyn Pilar, and was placed on metoprolol 25 mg twice daily.  Unfortunately, this resulted in GI upset and diarrhea and she  stopped taking it.  She does not think that she is ever tried any other beta-blockers.  In the setting of ongoing difficult to manage hypertension, she was referred to [place] today, at her request.  In addition

## 2022-04-16 NOTE — Telephone Encounter (Signed)
Voicemail left with patient to confirm that [facility] [place] [facility].  ?

## 2022-04-20 ENCOUNTER — Other Ambulatory Visit: Payer: Self-pay | Admitting: Family Medicine

## 2022-04-21 NOTE — Telephone Encounter (Signed)
Requested medication (s) are due for refill today: yes ? ?Requested medication (s) are on the active medication list: no ? ?Last refill:   ? ?Future visit scheduled: yes ? ?Notes to clinic:  supplies isn't listed on pt's med list. Please advise ? ? ?  ?Requested Prescriptions  ?Pending Prescriptions Disp Refills  ? Incontinence Supply Disposable (FQ PROTECTIVE UNDERWEAR) MISC [Pharmacy Med Name: IS,PRVL PU LG 4X18 CLASSIC [id number]"] 72 each 11  ?  Sig: 1 CSE PU LARGE PLUS USING DAILY AS NEEDED ADD WIPES MD=KARAMALEGOS-01/02/20 PER PT  ?  ? There is no refill protocol information for this order  ?  ? ?

## 2022-04-23 ENCOUNTER — Encounter
Admission: RE | Admit: 2022-04-23 | Discharge: 2022-04-23 | Disposition: A | Discharge status: Home / Self Care | Payer: 59 | Source: Ambulatory Visit | Attending: Nurse Practitioner | Admitting: Nurse Practitioner

## 2022-04-23 DIAGNOSIS — I200 Unstable angina: Secondary | ICD-10-CM | POA: Diagnosis present

## 2022-04-23 LAB — NM MYOCAR MULTI W/SPECT W/WALL MOTION / EF
LV dias vol: 120 mL (ref 46–106)
LV sys vol: 57 mL
Nuc Stress EF: 53 %
Peak HR: 76 {beats}/min
Percent HR: 46 %
Rest HR: 71 {beats}/min
Rest Nuclear Isotope Dose: 10.8 mCi
SDS: 4
SRS: 1
SSS: 4
ST Depression (mm): 0 mm
Stress Nuclear Isotope Dose: 31.7 mCi
TID: 0.86

## 2022-04-23 MED ORDER — REGADENOSON 0.4 MG/5ML IV SOLN
0.4000 mg | Freq: Once | INTRAVENOUS | Status: AC
  Administered 2022-04-23: 0.4 mg via INTRAVENOUS

## 2022-04-23 MED ORDER — TECHNETIUM TC 99M TETROFOSMIN IV KIT
31.7100 | PACK | Freq: Once | INTRAVENOUS | Status: AC | PRN
  Administered 2022-04-23: 31.71 via INTRAVENOUS

## 2022-04-23 MED ORDER — TECHNETIUM TC 99M TETROFOSMIN IV KIT
10.7500 | PACK | Freq: Once | INTRAVENOUS | Status: AC | PRN
  Administered 2022-04-23: 10.75 via INTRAVENOUS

## 2022-04-27 ENCOUNTER — Telehealth: Payer: Self-pay | Admitting: *Deleted

## 2022-04-27 DIAGNOSIS — E782 Mixed hyperlipidemia: Secondary | ICD-10-CM

## 2022-04-27 DIAGNOSIS — R9439 Abnormal result of other cardiovascular function study: Secondary | ICD-10-CM

## 2022-04-27 MED ORDER — ATORVASTATIN CALCIUM 40 MG PO TABS: 40.0000 mg | ORAL_TABLET | Freq: Every day | ORAL | 0 refills | Status: DC

## 2022-04-27 NOTE — Telephone Encounter (Signed)
Spoke with pt. Notified of myoview results and provider's recc.  ?Pt voiced understanding. Pt will: ? ?- Start atorvastatin 40 mg daily (Rx sent to [facility] per pt preference) ?- Follow up with lipid/lfts in 6 weeks (will forward to nurse to follow up notifying pt on where to have labs in 6 weeks) ?  Lab orders placed ?- Echo ordered, forwarded to scheduling to contact pt ? ?Pt has no further questions at this time.  ?

## 2022-04-27 NOTE — Telephone Encounter (Signed)
Order for echo in scheduling wq and patient was previously called.  ? ?Attempted to schedule.  LMOV to call office.  ? ?Closing encounter  ?

## 2022-04-27 NOTE — Telephone Encounter (Signed)
-----   Message from Theora Gianotti, NP sent at 04/23/2022  5:38 PM EDT ----- ?Good news.  Blood flow to the heart muscle looks good - so no suggestion of blockage.  The heart squeeze is recorded as moderately reduced, but sometimes that's a matter of the imaging that we get during stress testing, and so to better evaluate, I recommend that we obtain an echocardiogram. ?Coronary calcium again noted.  As she has been tolerating the carvedilol since we added, I recommend that we add lipitor '40mg'$  daily (we didn't want to add both at once previously, in case she developed an intolerance).  If she's willing to start, she should have f/u lipids/lfts in about 6 wks. ?

## 2022-05-04 ENCOUNTER — Other Ambulatory Visit: Payer: Self-pay | Admitting: Nurse Practitioner

## 2022-05-04 ENCOUNTER — Other Ambulatory Visit: Payer: Self-pay | Admitting: *Deleted

## 2022-05-04 ENCOUNTER — Encounter: Payer: Self-pay | Admitting: Nurse Practitioner

## 2022-05-04 MED ORDER — EZETIMIBE 10 MG PO TABS: 10.0000 mg | ORAL_TABLET | Freq: Every day | ORAL | 3 refills | Status: DC

## 2022-05-05 ENCOUNTER — Telehealth: Payer: Self-pay | Admitting: Student in an Organized Health Care Education/Training Program

## 2022-05-05 NOTE — Telephone Encounter (Signed)
Pt stated that she was having a hard time getting her script  (Oxycodone) because her regular Pharmacy has it on back order. Pt said that she [facility] [place] .[facility] and would like this update to be put in her chart. ?

## 2022-05-12 ENCOUNTER — Telehealth: Payer: Self-pay | Admitting: Nurse Practitioner

## 2022-05-12 NOTE — Telephone Encounter (Signed)
Copied from [place] [phone]. Topic: Medicare AWV >> May 12, 2022  3:10 PM Lavonia Drafts wrote: Reason for CRM:  N/A unable to leave a message for patient to call back and schedule the Medicare Annual Wellness Visit (AWV) virtually or by telephone.  Last AWV 01/23/20  Please schedule at anytime with [facility]FP-Nurse Health Advisor.   Any questions, please call me at [phone]

## 2022-05-16 ENCOUNTER — Encounter: Payer: Self-pay | Admitting: Student in an Organized Health Care Education/Training Program

## 2022-05-19 ENCOUNTER — Other Ambulatory Visit: Payer: Self-pay | Admitting: Nurse Practitioner

## 2022-05-19 ENCOUNTER — Telehealth: Payer: Self-pay | Admitting: Nurse Practitioner

## 2022-05-19 ENCOUNTER — Telehealth: Payer: Self-pay | Admitting: Student in an Organized Health Care Education/Training Program

## 2022-05-19 DIAGNOSIS — E1122 Type 2 diabetes mellitus with diabetic chronic kidney disease: Secondary | ICD-10-CM

## 2022-05-19 DIAGNOSIS — G894 Chronic pain syndrome: Secondary | ICD-10-CM

## 2022-05-19 DIAGNOSIS — M5412 Radiculopathy, cervical region: Secondary | ICD-10-CM

## 2022-05-19 DIAGNOSIS — J00 Acute nasopharyngitis [common cold]: Secondary | ICD-10-CM

## 2022-05-19 DIAGNOSIS — E1142 Type 2 diabetes mellitus with diabetic polyneuropathy: Secondary | ICD-10-CM

## 2022-05-19 NOTE — Telephone Encounter (Signed)
Pt stated that she needs a new script ([facility]codone[facility] [place]. Please call Pt and Pharmacy with an update.

## 2022-05-19 NOTE — Telephone Encounter (Signed)
Duplicate request

## 2022-05-19 NOTE — Telephone Encounter (Signed)
Dealing with this via Patient advise Request.  Waiting to make sure which pharmacy she would like this to go to.

## 2022-05-19 NOTE — Telephone Encounter (Unsigned)
Copied from [place] [phone]. Topic: General - Other >> May 19, 2022 11:33 AM Tessa Lerner A wrote: Reason for CRM: Medication Refill - Medication: ezetimibe (ZETIA) 10 MG tablet [[id number]]   Incontinence Supply Disposable (FQ PROTECTIVE UNDERWEAR) MISC [[id number]]   carvedilol (COREG) 3.125 MG tablet [[id number]]   diltiazem (CARDIZEM) 90 MG tablet [[id number]]   oxyCODONE (OXY IR/ROXICODONE) 5 MG immediate release tablet [[id number]]  ENDED  Cholecalciferol 1.25 MG (50000 UT) TABS [[id number]]   oxyCODONE (OXY IR/ROXICODONE) 5 MG immediate release tablet [[id number]]   oxyCODONE (OXY IR/ROXICODONE) 5 MG immediate release tablet [[id number]]   glucose blood test strip [[id number]]   omeprazole (PRILOSEC) 40 MG capsule [[id number]]   INSULIN SYRINGE .5CC/29G (TRUEPLUS INSULIN SYRINGE) 29G X 1/2" 0.5 ML MISC [[id number]]   levocetirizine (XYZAL) 5 MG tablet [[id number]]   insulin glargine (LANTUS) 100 UNIT/ML injection [[id number]]   NOVOLOG 100 UNIT/ML injection [[id number]]   busPIRone (BUSPAR) 5 MG tablet [[id number]]   latanoprost (XALATAN) 0.005 % ophthalmic solution [546270350]   promethazine (PHENERGAN) 25 MG tablet [[id number]]   heparin 1000 unit/ml SOLN injection [[id number]]   torsemide (DEMADEX) 100 MG tablet [[id number]]   Abatacept (ORENCIA) 125 MG/ML SOSY [[id number]]   midodrine (PROAMATINE) 10 MG tablet [[id number]]   brimonidine (ALPHAGAN) 0.2 % ophthalmic solution [[id number]]   dorzolamidel-timolol (COSOPT) 22.3-6.8 MG/ML SOLN ophthalmic solution [[id number]]   Has the patient contacted their pharmacy? Yes.   (Agent: If no, request that the patient contact the pharmacy for the refill. If patient does not wish to contact the pharmacy document the reason why and proceed with request.) (Agent: If yes, when and what did the pharmacy advise?)  Preferred Pharmacy (with phone number or street name): [id number] [place] [address] [address]Phone: [phone]: [phone]: Not open 24 hours   Has the patient been seen for an appointment in the last year OR does the patient have an upcoming appointment? Yes.    Agent: Please be advised that RX refills may take up to 3 business days. We ask that you follow-up with your pharmacy.

## 2022-05-19 NOTE — Telephone Encounter (Signed)
Copied from [place] [phone]. Topic: General - Other >> May 19, 2022 11:33 AM Tessa Lerner A wrote: Reason for CRM: Medication Refill - Medication: ezetimibe (ZETIA) 10 MG tablet [[id number]]   Incontinence Supply Disposable (FQ PROTECTIVE UNDERWEAR) MISC [[id number]]   carvedilol (COREG) 3.125 MG tablet [[id number]]   diltiazem (CARDIZEM) 90 MG tablet [[id number]]   oxyCODONE (OXY IR/ROXICODONE) 5 MG immediate release tablet [[id number]]  ENDED  Cholecalciferol 1.25 MG (50000 UT) TABS [[id number]]   oxyCODONE (OXY IR/ROXICODONE) 5 MG immediate release tablet [[id number]]   oxyCODONE (OXY IR/ROXICODONE) 5 MG immediate release tablet [[id number]]   glucose blood test strip [[id number]]   omeprazole (PRILOSEC) 40 MG capsule [[id number]]   INSULIN SYRINGE .5CC/29G (TRUEPLUS INSULIN SYRINGE) 29G X 1/2" 0.5 ML MISC [[id number]]   levocetirizine (XYZAL) 5 MG tablet [[id number]]   insulin glargine (LANTUS) 100 UNIT/ML injection [[id number]]   NOVOLOG 100 UNIT/ML injection [[id number]]   busPIRone (BUSPAR) 5 MG tablet [[id number]]   latanoprost (XALATAN) 0.005 % ophthalmic solution [[id number]]   promethazine (PHENERGAN) 25 MG tablet [[id number]]   heparin 1000 unit/ml SOLN injection [[id number]]   torsemide (DEMADEX) 100 MG tablet [[id number]]   Abatacept (ORENCIA) 125 MG/ML SOSY [[id number]]   midodrine (PROAMATINE) 10 MG tablet [[id number]]   brimonidine (ALPHAGAN) 0.2 % ophthalmic solution [[id number]]   dorzolamidel-timolol (COSOPT) 22.3-6.8 MG/ML SOLN ophthalmic solution [[id number]]   Has the patient contacted their pharmacy? Yes.  The patient will be using this pharmacy for the first time  (Agent: If no, request that the patient contact the pharmacy for the refill. If patient does not wish to contact the pharmacy document the reason why and proceed with request.) (Agent: If yes, when and what did the pharmacy advise?)  Preferred Pharmacy (with phone number or street name): [id number] [place] [address] [place]: [phone]: [phone]: Not open 24 hours   Has the patient been seen for an appointment in the last year OR does the patient have an upcoming appointment? Yes.    Agent: Please be advised that RX refills may take up to 3 business days. We ask that you follow-up with your pharmacy.

## 2022-05-20 ENCOUNTER — Telehealth: Payer: Self-pay

## 2022-05-20 MED ORDER — "INSULIN SYRINGE 29G X 1/2"" 0.5 ML MISC": 4 refills | Status: DC

## 2022-05-20 MED ORDER — GLUCOSE BLOOD VI STRP: ORAL_STRIP | 11 refills | Status: DC

## 2022-05-20 MED ORDER — DILTIAZEM HCL 90 MG PO TABS: 90.0000 mg | ORAL_TABLET | Freq: Four times a day (QID) | ORAL | 11 refills | Status: DC

## 2022-05-20 MED ORDER — NOVOLOG 100 UNIT/ML IJ SOLN: 5.0000 [IU] | Freq: Three times a day (TID) | INTRAMUSCULAR | 10 refills | Status: DC

## 2022-05-20 MED ORDER — INSULIN GLARGINE 100 UNIT/ML ~~LOC~~ SOLN: SUBCUTANEOUS | 5 refills | Status: DC

## 2022-05-20 MED ORDER — OMEPRAZOLE 40 MG PO CPDR: 40.0000 mg | DELAYED_RELEASE_CAPSULE | Freq: Every day | ORAL | 3 refills | Status: DC

## 2022-05-20 MED ORDER — LEVOCETIRIZINE DIHYDROCHLORIDE 5 MG PO TABS: 5.0000 mg | ORAL_TABLET | Freq: Every evening | ORAL | 3 refills | Status: DC

## 2022-05-20 NOTE — Telephone Encounter (Signed)
Called pt and LM on VM to call back office. Pt had several [facility] that [facility]  If pt calls back, there are 3 medications that are not refilled by PCP. Pt will need to call cardiologist for Zetia Pt will need to call pain clinic for the oxycodone [facility]. She will need to call her nephrologist to get a prescription for Heparin 1000 unit/ml SOLN injection.   Requested Prescriptions  Pending Prescriptions Disp Refills   dorzolamidel-timolol (COSOPT) 22.3-6.8 MG/ML SOLN ophthalmic solution      Sig: Place 1 drop into the left eye 2 (two) times daily.     Ophthalmology:  Glaucoma - dorzolamide / timolol Failed - 05/19/2022  1:11 PM      Failed - Cr in normal range and within 360 days    Creat  Date Value Ref Range Status  12/27/2017 7.43 (H) 0.50 - 1.05 mg/dL Final    Comment:    For patients >54 years of age, the reference limit for Creatinine is approximately 13% higher for people identified as African-American. .    Creatinine, Ser  Date Value Ref Range Status  03/18/2022 3.76 (H) 0.57 - 1.00 mg/dL Final         Failed - eGFR is 30 or above and within 360 days    GFR, Est African American  Date Value Ref Range Status  12/27/2017 7 (L) > OR = 60 mL/min/1.68m2 Final   GFR calc Af Amer  Date Value Ref Range Status  05/01/2019 6 (L) >60 mL/min Final   GFR, Est Non African American  Date Value Ref Range Status  12/27/2017 6 (L) > OR = 60 mL/min/1.7m2 Final   GFR calc non Af Amer  Date Value Ref Range Status  05/01/2019 6 (L) >60 mL/min Final   eGFR  Date Value Ref Range Status  03/18/2022 13 (L) >59 mL/min/1.73 Final         Failed - Last BP in normal range    BP Readings from Last 1 Encounters:  04/16/22 (!) 179/77         Passed - Last Heart Rate in normal range    Pulse Readings from Last 1 Encounters:  04/16/22 73         Passed - Valid encounter within last 12 months    Recent Outpatient Visits           1 month ago  Hypertension associated with diabetes ([place])   [place], Jolene T, NP   2 months ago Type 2 diabetes mellitus with ESRD (end-stage renal disease) ([place])   [place], Jolene T, NP   6 months ago Type 2 diabetes mellitus with ESRD (end-stage renal disease) ([place])   [place], Jolene T, NP   9 months ago Type 2 diabetes mellitus with ESRD (end-stage renal disease) ([place])   [facility], [name], DO   1 year ago Type 2 diabetes mellitus with ESRD (end-stage renal disease) ([place])   [facility], [name], DO       Future Appointments             Tomorrow Sharolyn Douglas, Clance Boll, NP [facility], [facility]   In 4 weeks [place], [place] T, NP [facility], [facility]              brimonidine (ALPHAGAN) 0.2 % ophthalmic solution 5 mL  Sig: Place 1 drop into the left eye in the morning and at bedtime.     Ophthalmology:  Glaucoma Passed - 05/19/2022  1:11 PM      Passed - Valid encounter within last 12 months    Recent Outpatient Visits           1 month ago Hypertension associated with diabetes ([place])   [place], Jolene T, NP   2 months ago Type 2 diabetes mellitus with ESRD (end-stage renal disease) ([place])   [place] Cannady, Jolene T, NP   6 months ago Type 2 diabetes mellitus with ESRD (end-stage renal disease) ([place])   [place] Cannady, Jolene T, NP   9 months ago Type 2 diabetes mellitus with ESRD (end-stage renal disease) ([place])   [facility], [name], DO   1 year ago Type 2 diabetes mellitus with ESRD (end-stage renal disease) ([place])   [facility], [name], DO       Future Appointments             Tomorrow Theora Gianotti, NP [facility], [facility]   In 4 weeks [place], Barbaraann Faster,  NP [facility], [facility]              midodrine (PROAMATINE) 10 MG tablet      Sig: Take 0.5-1 tablets (5-10 mg total) by mouth daily as needed.     Not Delegated - Cardiovascular: Midodrine Failed - 05/19/2022  1:11 PM      Failed - This refill cannot be delegated      Failed - Cr in normal range and within 360 days    Creat  Date Value Ref Range Status  12/27/2017 7.43 (H) 0.50 - 1.05 mg/dL Final    Comment:    For patients >65 years of age, the reference limit for Creatinine is approximately 13% higher for people identified as African-American. .    Creatinine, Ser  Date Value Ref Range Status  03/18/2022 3.76 (H) 0.57 - 1.00 mg/dL Final         Failed - Last BP in normal range    BP Readings from Last 1 Encounters:  04/16/22 (!) 179/77         Passed - ALT in normal range and within 360 days    ALT  Date Value Ref Range Status  03/18/2022 16 0 - 32 IU/L Final   SGPT (ALT)  Date Value Ref Range Status  10/15/2014 54 U/L Final    Comment:    14-63 NOTE: New Reference Range 07/10/14    ALT (SGPT) P5P  Date Value Ref Range Status  07/11/2019 28 0 - 40 IU/L Final         Passed - AST in normal range and within 360 days    AST  Date Value Ref Range Status  03/18/2022 20 0 - 40 IU/L Final   SGOT(AST)  Date Value Ref Range Status  10/15/2014 46 (H) 15 - 63 Unit/L Final         Passed - Valid encounter within last 12 months    Recent Outpatient Visits           1 month ago Hypertension associated with diabetes ([place])   [place], Jolene T, NP   2 months ago Type 2 diabetes mellitus with ESRD (end-stage renal disease) ([place])   [place] Cannady, Barbaraann Faster, NP   6  months ago Type 2 diabetes mellitus with ESRD (end-stage renal disease) ([place])   [place] Cannady, Jolene T, NP   9 months ago Type 2 diabetes mellitus with ESRD (end-stage renal disease) ([place])   [facility]  Olin Hauser, DO   1 year ago Type 2 diabetes mellitus with ESRD (end-stage renal disease) ([place])   [facility] Olin Hauser, DO       Future Appointments             Tomorrow Sharolyn Douglas Clance Boll, NP [facility], [facility]   In 4 weeks [place], Henrine Screws T, NP [facility], [facility]              Abatacept (ORENCIA) 125 MG/ML SOSY 3.92 mL     Sig: Inject into the skin.     Not Delegated - Analgesics:  Antirheumatic Agents - abatacept & hydroxychloroquine Failed - 05/19/2022  1:11 PM      Failed - This refill cannot be delegated      Passed - Valid encounter within last 6 months    Recent Outpatient Visits           1 month ago Hypertension associated with diabetes ()   [place], Jolene T, NP   2 months ago Type 2 diabetes mellitus with ESRD (end-stage renal disease) ([place])   [place] Cannady, Jolene T, NP   6 months ago Type 2 diabetes mellitus with ESRD (end-stage renal disease) ([place])   [place] Cannady, Jolene T, NP   9 months ago Type 2 diabetes mellitus with ESRD (end-stage renal disease) ([place])   [facility], [name], DO   1 year ago Type 2 diabetes mellitus with ESRD (end-stage renal disease) ([place])   [facility], [name], DO       Future Appointments             Tomorrow Theora Gianotti, NP [facility], [facility]   In 4 weeks [place], Henrine Screws T, NP [facility], [facility]              torsemide (DEMADEX) 100 MG tablet      Sig: Take 1 tablet (100 mg total) by mouth daily.     Cardiovascular:  Diuretics - Loop Failed - 05/19/2022  1:11 PM      Failed - Cr in normal range and within 180 days    Creat  Date Value Ref Range Status  12/27/2017 7.43 (H) 0.50 - 1.05 mg/dL Final    Comment:    For patients >62 years of age, the reference  limit for Creatinine is approximately 13% higher for people identified as African-American. .    Creatinine, Ser  Date Value Ref Range Status  03/18/2022 3.76 (H) 0.57 - 1.00 mg/dL Final         Failed - Mg Level in normal range and within 180 days    Magnesium  Date Value Ref Range Status  11/11/2021 2.1 1.6 - 2.3 mg/dL Final  12/29/2012 1.7 (L) mg/dL Final    Comment:    1.8-2.4 THERAPEUTIC RANGE: 4-7 mg/dL TOXIC: > 10 mg/dL  -----------------------          Failed - Last BP in normal range    BP Readings from Last 1 Encounters:  04/16/22 (!) 179/77         Passed - K in normal range and within 180  days    Potassium  Date Value Ref Range Status  03/18/2022 4.3 3.5 - 5.2 mmol/L Final  10/31/2014 4.7 3.5 - 5.1 mmol/L Final   Potassium [facility] vascular lab)  Date Value Ref Range Status  04/23/2020 5.0 3.5 - 5.1 Final    Comment:    Performed at [facility], [address] [place], [place] [address]         Passed - Ca in normal range and within 180 days    Calcium  Date Value Ref Range Status  03/18/2022 8.7 8.7 - 10.2 mg/dL Final   Calcium, Total  Date Value Ref Range Status  10/31/2014 7.7 (L) 8.5 - 10.1 mg/dL Final         Passed - Na in normal range and within 180 days    Sodium  Date Value Ref Range Status  03/18/2022 138 134 - 144 mmol/L Final  10/31/2014 133 (L) 136 - 145 mmol/L Final         Passed - Cl in normal range and within 180 days    Chloride  Date Value Ref Range Status  03/18/2022 99 96 - 106 mmol/L Final  10/31/2014 102 98 - 107 mmol/L Final         Passed - Valid encounter within last 6 months    Recent Outpatient Visits           1 month ago Hypertension associated with diabetes (HCC)   [facility] [place], Jolene T, NP   2 months ago Type 2 diabetes mellitus with ESRD (end-stage renal disease) (HCC)   [facility] Cannady, Jolene T, NP   6 months ago Type 2 diabetes mellitus with ESRD  (end-stage renal disease) (HCC)   [facility] Cannady, Jolene T, NP   9 months ago Type 2 diabetes mellitus with ESRD (end-stage renal disease) (HCC)   [facility], Netta Neat, DO   1 year ago Type 2 diabetes mellitus with ESRD (end-stage renal disease) (HCC)   [facility], Netta Neat, DO       Future Appointments             Tomorrow Creig Hines, NP [facility], [facility]   In 4 weeks [place], Dorie Rank, NP [facility], [facility]              promethazine (PHENERGAN) 25 MG tablet 30 tablet     Sig: Take 1 tablet (25 mg total) by mouth 2 (two) times daily as needed.     Not Delegated - Gastroenterology: Antiemetics Failed - 05/19/2022  1:11 PM      Failed - This refill cannot be delegated      Passed - Valid encounter within last 6 months    Recent Outpatient Visits           1 month ago Hypertension associated with diabetes (HCC)   [facility] [place], Jolene T, NP   2 months ago Type 2 diabetes mellitus with ESRD (end-stage renal disease) (HCC)   [facility] Cannady, Jolene T, NP   6 months ago Type 2 diabetes mellitus with ESRD (end-stage renal disease) (HCC)   [facility] Cannady, Jolene T, NP   9 months ago Type 2 diabetes mellitus with ESRD (end-stage renal disease) (HCC)   [facility] Smitty Cords, DO   1 year ago Type 2 diabetes mellitus with ESRD (end-stage renal disease) (HCC)  [place], DO       Future Appointments             Tomorrow Sharolyn Douglas, Clance Boll, NP [facility], [facility]   In 4 weeks [place], [place] T, NP [facility], [facility]              latanoprost (XALATAN) 0.005 % ophthalmic solution 2.5 mL     Sig: Place 1 drop into the left eye daily.     Ophthalmology:  Glaucoma Passed - 05/19/2022   1:11 PM      Passed - Valid encounter within last 12 months    Recent Outpatient Visits           1 month ago Hypertension associated with diabetes ([place])   [place], Jolene T, NP   2 months ago Type 2 diabetes mellitus with ESRD (end-stage renal disease) ([place])   [place] Cannady, Jolene T, NP   6 months ago Type 2 diabetes mellitus with ESRD (end-stage renal disease) ([place])   [place] Cannady, Jolene T, NP   9 months ago Type 2 diabetes mellitus with ESRD (end-stage renal disease) ([place])   [facility], [name], DO   1 year ago Type 2 diabetes mellitus with ESRD (end-stage renal disease) ([place])   [facility], [name], DO       Future Appointments             Tomorrow Theora Gianotti, NP [facility], [facility]   In 4 weeks [place], Barbaraann Faster, NP [facility], [facility]              busPIRone (BUSPAR) 5 MG tablet 180 tablet 3    Sig: Take 2 tablets (10 mg total) by mouth 2 (two) times daily as needed.     Psychiatry: Anxiolytics/Hypnotics - Non-controlled Passed - 05/19/2022  1:11 PM      Passed - Valid encounter within last 12 months    Recent Outpatient Visits           1 month ago Hypertension associated with diabetes ([place])   [place], Jolene T, NP   2 months ago Type 2 diabetes mellitus with ESRD (end-stage renal disease) ([place])   [place] Cannady, Jolene T, NP   6 months ago Type 2 diabetes mellitus with ESRD (end-stage renal disease) ([place])   [place] Cannady, Jolene T, NP   9 months ago Type 2 diabetes mellitus with ESRD (end-stage renal disease) ([place])   [facility], [name], DO   1 year ago Type 2 diabetes mellitus with ESRD (end-stage renal disease) ([place])   [facility], [name], DO       Future  Appointments             Tomorrow Theora Gianotti, NP [facility], [facility]   In 4 weeks [place], Henrine Screws T, NP [facility], [facility]              Cholecalciferol 1.25 MG (50000 UT) TABS 12 tablet     Sig: Take 1 tablet by mouth once a week.     Endocrinology:  Vitamins - Vitamin D Supplementation 2 Failed - 05/19/2022  1:11 PM      Failed - Manual Review: Route requests for 50,000 IU strength to the provider      Failed -  Vitamin D in normal range and within 360 days    Vit D, 25-Hydroxy  Date Value Ref Range Status  03/18/2022 6.5 (L) 30.0 - 100.0 ng/mL Final    Comment:    Vitamin D deficiency has been defined by the [place] practice guideline as a level of serum 25-OH vitamin D less than 20 ng/mL (1,2). The Endocrine Society went on to further define vitamin D insufficiency as a level between 21 and 29 ng/mL (2). 1. IOM (Institute of Medicine). 2010. Dietary reference    intakes for calcium and D. [place]: The    [employer]. 2. Holick MF, Binkley Doniphan, Bischoff-Ferrari HA, et al.    Evaluation, treatment, and prevention of vitamin D    deficiency: an Endocrine Society clinical practice    guideline. JCEM. 2011 Jul; 96(7):1911-30.          Passed - Ca in normal range and within 360 days    Calcium  Date Value Ref Range Status  03/18/2022 8.7 8.7 - 10.2 mg/dL Final   Calcium, Total  Date Value Ref Range Status  10/31/2014 7.7 (L) 8.5 - 10.1 mg/dL Final         Passed - Valid encounter within last 12 months    Recent Outpatient Visits           1 month ago Hypertension associated with diabetes ([place])   [place], Jolene T, NP   2 months ago Type 2 diabetes mellitus with ESRD (end-stage renal disease) ([place])   [place] Cannady, Jolene T, NP   6 months ago Type 2 diabetes mellitus with ESRD (end-stage renal disease) ([place])   [place] Cannady, Jolene T, NP   9 months ago Type 2 diabetes mellitus with ESRD (end-stage renal disease) ([place])   [facility], [name], DO   1 year ago Type 2 diabetes mellitus with ESRD (end-stage renal disease) ([place])   [facility], [name], DO       Future Appointments             Tomorrow Sharolyn Douglas, Clance Boll, NP [facility], [facility]   In 4 weeks [place], Barbaraann Faster, NP [facility]              Incontinence Supply Disposable (FQ PROTECTIVE UNDERWEAR) MISC 72 each     Sig: 1 CSE PU LARGE PLUS USING DAILY AS NEEDED ADD WIPES MD=KARAMALEGOS-01/02/20 PER PT     There is no refill protocol information for this order    Signed Prescriptions Disp Refills   NOVOLOG 100 UNIT/ML injection 10 mL 10    Sig: Inject 5-10 Units into the skin 3 (three) times daily before meals.     Endocrinology:  Diabetes - Insulins Passed - 05/19/2022  1:11 PM      Passed - HBA1C is between 0 and 7.9 and within 180 days    Hemoglobin A1C  Date Value Ref Range Status  07/20/2021 8.2  Final    Comment:    [facility]   HB A1C (BAYER DCA - WAIVED)  Date Value Ref Range Status  03/18/2022 7.5 (H) 4.8 - 5.6 % Final    Comment:             Prediabetes: 5.7 - 6.4          Diabetes: >6.4  Glycemic control for adults with diabetes: <7.0          Passed - Valid encounter within last 6 months    Recent Outpatient Visits           1 month ago Hypertension associated with diabetes ([place])   [place], Jolene T, NP   2 months ago Type 2 diabetes mellitus with ESRD (end-stage renal disease) ([place])   [place] Cannady, Jolene T, NP   6 months ago Type 2 diabetes mellitus with ESRD (end-stage renal disease) ([place])   [place] Cannady, Jolene T, NP   9 months ago Type 2 diabetes mellitus with ESRD (end-stage renal disease) ([place])    [facility] Olin Hauser, DO   1 year ago Type 2 diabetes mellitus with ESRD (end-stage renal disease) ([place])   [facility], [name], DO       Future Appointments             Tomorrow Sharolyn Douglas, Clance Boll, NP [facility], [facility]   In 4 weeks [place], Barbaraann Faster, NP [place]              insulin glargine (LANTUS) 100 UNIT/ML injection 10 mL 5    Sig: INJECT [place] A DAY     Endocrinology:  Diabetes - Insulins Passed - 05/19/2022  1:11 PM      Passed - HBA1C is between 0 and 7.9 and within 180 days    Hemoglobin A1C  Date Value Ref Range Status  07/20/2021 8.2  Final    Comment:    [facility]   HB A1C (BAYER DCA - WAIVED)  Date Value Ref Range Status  03/18/2022 7.5 (H) 4.8 - 5.6 % Final    Comment:             Prediabetes: 5.7 - 6.4          Diabetes: >6.4          Glycemic control for adults with diabetes: <7.0          Passed - Valid encounter within last 6 months    Recent Outpatient Visits           1 month ago Hypertension associated with diabetes ([place])   [place], Jolene T, NP   2 months ago Type 2 diabetes mellitus with ESRD (end-stage renal disease) ([place])   [place], Jolene T, NP   6 months ago Type 2 diabetes mellitus with ESRD (end-stage renal disease) ([place])   [place], Jolene T, NP   9 months ago Type 2 diabetes mellitus with ESRD (end-stage renal disease) ([place])   [facility], [name], DO   1 year ago Type 2 diabetes mellitus with ESRD (end-stage renal disease) ([place])   [facility] [place], [name], DO       Future Appointments             Tomorrow Theora Gianotti, NP [facility], [facility]   In 4 weeks [place], Barbaraann Faster, NP [facility], [facility]               levocetirizine (XYZAL) 5 MG tablet 90 tablet 3    Sig: Take 1 tablet (5 mg total) by mouth every evening.     Ear, Nose, and Throat:  Antihistamines - levocetirizine dihydrochloride Failed - 05/19/2022  1:11 PM      Failed - Cr in normal range and within 360 days    Creat  Date Value Ref Range Status  12/27/2017 7.43 (H) 0.50 - 1.05 mg/dL Final    Comment:    For patients >90 years of age, the reference limit for Creatinine is approximately 13% higher for people identified as African-American. .    Creatinine, Ser  Date Value Ref Range Status  03/18/2022 3.76 (H) 0.57 - 1.00 mg/dL Final         Passed - eGFR is 10 or above and within 360 days    GFR, Est African American  Date Value Ref Range Status  12/27/2017 7 (L) > OR = 60 mL/min/1.72m2 Final   GFR calc Af Amer  Date Value Ref Range Status  05/01/2019 6 (L) >60 mL/min Final   GFR, Est Non African American  Date Value Ref Range Status  12/27/2017 6 (L) > OR = 60 mL/min/1.54m2 Final   GFR calc non Af Amer  Date Value Ref Range Status  05/01/2019 6 (L) >60 mL/min Final   eGFR  Date Value Ref Range Status  03/18/2022 13 (L) >59 mL/min/1.73 Final         Passed - Valid encounter within last 12 months    Recent Outpatient Visits           1 month ago Hypertension associated with diabetes ([place])   [place], Jolene T, NP   2 months ago Type 2 diabetes mellitus with ESRD (end-stage renal disease) ([place])   [place], Jolene T, NP   6 months ago Type 2 diabetes mellitus with ESRD (end-stage renal disease) ([place])   [place], Jolene T, NP   9 months ago Type 2 diabetes mellitus with ESRD (end-stage renal disease) ([place])   [facility], [name], DO   1 year ago Type 2 diabetes mellitus with ESRD (end-stage renal disease) ([place])   [place], [name], DO       Future  Appointments             Tomorrow Sharolyn Douglas, Clance Boll, NP [facility], [facility]   In 4 weeks [place], Barbaraann Faster, NP [facility]              INSULIN SYRINGE .5CC/29G (TRUEPLUS INSULIN SYRINGE) 29G X 1/2" 0.5 ML MISC 100 each 4    Sig: USE 4 TIMES A DAY WITH LANTUS & NOVOLOG     There is no refill protocol information for this order     omeprazole (PRILOSEC) 40 MG capsule 90 capsule 3    Sig: Take 1 capsule (40 mg total) by mouth daily.     Gastroenterology: Proton Pump Inhibitors Passed - 05/19/2022  1:11 PM      Passed - Valid encounter within last 12 months    Recent Outpatient Visits           1 month ago Hypertension associated with diabetes ([place])   [place], Jolene T, NP   2 months ago Type 2 diabetes mellitus with ESRD (end-stage renal disease) ([place])   [place] Cannady, Jolene T, NP   6 months ago Type 2 diabetes mellitus with ESRD (end-stage renal disease) ([place])   [place], Jolene T, NP   9 months ago Type 2 diabetes mellitus with ESRD (end-stage renal disease) ([place])   [facility] Serene  [place], DO   1 year ago Type 2 diabetes mellitus with ESRD (end-stage renal disease) ([place])   [place], [name], DO       Future Appointments             Tomorrow Theora Gianotti, NP [facility], [facility]   In 4 weeks [place], Barbaraann Faster, NP [facility]              glucose blood test strip 100 each 11    Sig: Use to check blood sugar 3 times daily, fasting in morning with goal <130 and 2 hours after meals with goal <180.  Bring blood sugar log to visits.     Endocrinology: Diabetes - Testing Supplies Passed - 05/19/2022  1:11 PM      Passed - Valid encounter within last 12 months    Recent Outpatient Visits           1 month ago Hypertension associated with  diabetes ([place])   [place], Jolene T, NP   2 months ago Type 2 diabetes mellitus with ESRD (end-stage renal disease) ([place])   [place] Cannady, Jolene T, NP   6 months ago Type 2 diabetes mellitus with ESRD (end-stage renal disease) ([place])   [place] Cannady, Jolene T, NP   9 months ago Type 2 diabetes mellitus with ESRD (end-stage renal disease) ([place])   [facility], [name], DO   1 year ago Type 2 diabetes mellitus with ESRD (end-stage renal disease) ([place])   [facility], [name], DO       Future Appointments             Tomorrow Theora Gianotti, NP [facility], [facility]   In 4 weeks [place], Barbaraann Faster, NP [facility], [facility]              diltiazem (CARDIZEM) 90 MG tablet 120 tablet 11    Sig: Take 1 tablet (90 mg total) by mouth 4 (four) times daily.     Cardiovascular: Calcium Channel Blockers 3 Failed - 05/19/2022  1:11 PM      Failed - Cr in normal range and within 360 days    Creat  Date Value Ref Range Status  12/27/2017 7.43 (H) 0.50 - 1.05 mg/dL Final    Comment:    For patients >53 years of age, the reference limit for Creatinine is approximately 13% higher for people identified as African-American. .    Creatinine, Ser  Date Value Ref Range Status  03/18/2022 3.76 (H) 0.57 - 1.00 mg/dL Final         Failed - Last BP in normal range    BP Readings from Last 1 Encounters:  04/16/22 (!) 179/77         Passed - ALT in normal range and within 360 days    ALT  Date Value Ref Range Status  03/18/2022 16 0 - 32 IU/L Final   SGPT (ALT)  Date Value Ref Range Status  10/15/2014 54 U/L Final    Comment:    14-63 NOTE: New Reference Range 07/10/14    ALT (SGPT) P5P  Date Value Ref Range Status  07/11/2019 28 0 - 40 IU/L Final         Passed - AST in normal range and within 360 days    AST  Date  Value  Ref Range Status  03/18/2022 20 0 - 40 IU/L Final   SGOT(AST)  Date Value Ref Range Status  10/15/2014 46 (H) 15 - 37 Unit/L Final         Passed - Last Heart Rate in normal range    Pulse Readings from Last 1 Encounters:  04/16/22 73         Passed - Valid encounter within last 6 months    Recent Outpatient Visits           1 month ago Hypertension associated with diabetes ([place])   [facility], Jolene T, NP   2 months ago Type 2 diabetes mellitus with ESRD (end-stage renal disease) ([place])   [facility], Jolene T, NP   6 months ago Type 2 diabetes mellitus with ESRD (end-stage renal disease) ([place])   [facility], Jolene T, NP   9 months ago Type 2 diabetes mellitus with ESRD (end-stage renal disease) ([place])   [facility] Olin Hauser, DO   1 year ago Type 2 diabetes mellitus with ESRD (end-stage renal disease) ([place])   [facility], [name], DO       Future Appointments             Tomorrow Sharolyn Douglas, Clance Boll, NP [facility], [place]   In 4 weeks [place], Barbaraann Faster, NP [facility]

## 2022-05-20 NOTE — Telephone Encounter (Signed)
Patient called back she wants her pharmacy changed to [facility] in [place]. Please call patient when this done.

## 2022-05-20 NOTE — Telephone Encounter (Signed)
Change of pharmacy Requested Prescriptions  Pending Prescriptions Disp Refills  . ezetimibe (ZETIA) 10 MG tablet 90 tablet 3    Sig: Take 1 tablet (10 mg total) by mouth daily.     Cardiovascular:  Antilipid - Sterol Transport Inhibitors Failed - 05/19/2022  1:11 PM      Failed - Lipid Panel in normal range within the last 12 months    Cholesterol, Total  Date Value Ref Range Status  03/18/2022 175 100 - 199 mg/dL Final   Cholesterol  Date Value Ref Range Status  10/15/2014 157 0 - 200 mg/dL Final   Ldl Cholesterol, Calc  Date Value Ref Range Status  10/15/2014 91 0 - 100 mg/dL Final   LDL Chol Calc (NIH)  Date Value Ref Range Status  03/18/2022 109 (H) 0 - 99 mg/dL Final   HDL Cholesterol  Date Value Ref Range Status  10/15/2014 49 40 - 60 mg/dL Final   HDL  Date Value Ref Range Status  03/18/2022 39 (L) >39 mg/dL Final   Triglycerides  Date Value Ref Range Status  03/18/2022 153 (H) 0 - 149 mg/dL Final  10/15/2014 83 0 - 200 mg/dL Final         Passed - AST in normal range and within 360 days    AST  Date Value Ref Range Status  03/18/2022 20 0 - 40 IU/L Final   SGOT(AST)  Date Value Ref Range Status  10/15/2014 46 (H) 15 - 37 Unit/L Final         Passed - ALT in normal range and within 360 days    ALT  Date Value Ref Range Status  03/18/2022 16 0 - 32 IU/L Final   SGPT (ALT)  Date Value Ref Range Status  10/15/2014 54 U/L Final    Comment:    14-63 NOTE: New Reference Range 07/10/14    ALT (SGPT) P5P  Date Value Ref Range Status  07/11/2019 28 0 - 40 IU/L Final         Passed - Patient is not pregnant      Passed - Valid encounter within last 12 months    Recent Outpatient Visits          1 month ago Hypertension associated with diabetes ([place])   [place], Jolene T, NP   2 months ago Type 2 diabetes mellitus with ESRD (end-stage renal disease) ([place])   [place], Jolene T, NP   6 months ago  Type 2 diabetes mellitus with ESRD (end-stage renal disease) ([place])   [place], Jolene T, NP   9 months ago Type 2 diabetes mellitus with ESRD (end-stage renal disease) ([place])   [facility], [name], DO   1 year ago Type 2 diabetes mellitus with ESRD (end-stage renal disease) ([place])   [facility], [name], DO      Future Appointments            Tomorrow Theora Gianotti, NP [facility], [facility]   In 4 weeks [place], Quesada T, NP [facility], [facility]           . dorzolamidel-timolol (COSOPT) 22.3-6.8 MG/ML SOLN ophthalmic solution      Sig: Place 1 drop into the left eye 2 (two) times daily.     Ophthalmology:  Glaucoma - dorzolamide / timolol Failed - 05/19/2022  1:11 PM      Failed - Cr in  normal range and within 360 days    Creat  Date Value Ref Range Status  12/27/2017 7.43 (H) 0.50 - 1.05 mg/dL Final    Comment:    For patients >24 years of age, the reference limit for Creatinine is approximately 13% higher for people identified as African-American. .    Creatinine, Ser  Date Value Ref Range Status  03/18/2022 3.76 (H) 0.57 - 1.00 mg/dL Final         Failed - eGFR is 30 or above and within 360 days    GFR, Est African American  Date Value Ref Range Status  12/27/2017 7 (L) > OR = 60 mL/min/1.47m2 Final   GFR calc Af Amer  Date Value Ref Range Status  05/01/2019 6 (L) >60 mL/min Final   GFR, Est Non African American  Date Value Ref Range Status  12/27/2017 6 (L) > OR = 60 mL/min/1.30m2 Final   GFR calc non Af Amer  Date Value Ref Range Status  05/01/2019 6 (L) >60 mL/min Final   eGFR  Date Value Ref Range Status  03/18/2022 13 (L) >59 mL/min/1.73 Final         Failed - Last BP in normal range    BP Readings from Last 1 Encounters:  04/16/22 (!) 179/77         Passed - Last Heart Rate in normal range    Pulse Readings from Last 1  Encounters:  04/16/22 73         Passed - Valid encounter within last 12 months    Recent Outpatient Visits          1 month ago Hypertension associated with diabetes ([place])   [facility], J[facility]ene T, NP   2 months ago Type 2 diabetes mellitus with ESRD (end-stage renal disease) ([place])   [place], Jolene T, NP   6 months ago Type 2 diabetes mellitus with ESRD (end-stage renal disease) ([place])   [place], Jolene T, NP   9 months ago Type 2 diabetes mellitus with ESRD (end-stage renal disease) ([place])   [facility], [name], DO   1 year ago Type 2 diabetes mellitus with ESRD (end-stage renal disease) ([place])   [facility], [name], DO      Future Appointments            Tomorrow Theora Gianotti, NP [facility], [facility]   In 4 weeks [place], Barbaraann Faster, NP [facility], [facility]           . brimonidine (ALPHAGAN) 0.2 % ophthalmic solution 5 mL     Sig: Place 1 drop into the left eye in the morning and at bedtime.     Ophthalmology:  Glaucoma Passed - 05/19/2022  1:11 PM      Passed - Valid encounter within last 12 months    Recent Outpatient Visits          1 month ago Hypertension associated with diabetes ([place])   [place], Jolene T, NP   2 months ago Type 2 diabetes mellitus with ESRD (end-stage renal disease) ([place])   [place] [facility], Jolene T, NP   6 months ago Type 2 diabetes mellitus with ESRD (end-stage renal disease) ([place])   [place] [facility], Jolene T, NP   9 months ago Type 2 diabetes mellitus with ESRD (end-stage renal disease) ([place])   [facility]  Olin Hauser, DO   1 year ago Type 2 diabetes mellitus with ESRD (end-stage renal disease) ([place])   [place], DO      Future Appointments             Tomorrow Theora Gianotti, NP [facility], [facility]   In 4 weeks [place], Barbaraann Faster, NP [facility], [facility]           . midodrine (PROAMATINE) 10 MG tablet      Sig: Take 0.5-1 tablets (5-10 mg total) by mouth daily as needed.     Not Delegated - Cardiovascular: Midodrine Failed - 05/19/2022  1:11 PM      Failed - This refill cannot be delegated      Failed - Cr in normal range and within 360 days    Creat  Date Value Ref Range Status  12/27/2017 7.43 (H) 0.50 - 1.05 mg/dL Final    Comment:    For patients >31 years of age, the reference limit for Creatinine is approximately 13% higher for people identified as African-American. .    Creatinine, Ser  Date Value Ref Range Status  03/18/2022 3.76 (H) 0.57 - 1.00 mg/dL Final         Failed - Last BP in normal range    BP Readings from Last 1 Encounters:  04/16/22 (!) 179/77         Passed - ALT in normal range and within 360 days    ALT  Date Value Ref Range Status  03/18/2022 16 0 - 32 IU/L Final   SGPT (ALT)  Date Value Ref Range Status  10/15/2014 54 U/L Final    Comment:    14-63 NOTE: New Reference Range 07/10/14    ALT (SGPT) P5P  Date Value Ref Range Status  07/11/2019 28 0 - 40 IU/L Final         Passed - AST in normal range and within 360 days    AST  Date Value Ref Range Status  03/18/2022 20 0 - 40 IU/L Final   SGOT(AST)  Date Value Ref Range Status  10/15/2014 46 (H) 15 - 56 Unit/L Final         Passed - Valid encounter within last 12 months    Recent Outpatient Visits          1 month ago Hypertension associated with diabetes ([place])   [place], Jolene T, NP   2 months ago Type 2 diabetes mellitus with ESRD (end-stage renal disease) ([place])   [place], Jolene T, NP   6 months ago Type 2 diabetes mellitus with ESRD (end-stage renal disease) ([place])   [place], Jolene T, NP    9 months ago Type 2 diabetes mellitus with ESRD (end-stage renal disease) ([place])   [facility] Olin Hauser, DO   1 year ago Type 2 diabetes mellitus with ESRD (end-stage renal disease) ([place])   [facility], [name], DO      Future Appointments            Tomorrow Sharolyn Douglas, Clance Boll, NP [facility], [place]   In 4 weeks [place], Barbaraann Faster, NP [facility], [facility]           . Abatacept (ORENCIA) 125 MG/ML SOSY 3.92 mL     Sig: Inject into the skin.     Not Delegated - Analgesics:  Antirheumatic Agents - abatacept & hydroxychloroquine Failed - 05/19/2022  1:11 PM      Failed - This refill cannot be delegated      Passed - Valid encounter within last 6 months    Recent Outpatient Visits          1 month ago Hypertension associated with diabetes ([place])   [place], Jolene T, NP   2 months ago Type 2 diabetes mellitus with ESRD (end-stage renal disease) ([place])   [place] Cannady, Jolene T, NP   6 months ago Type 2 diabetes mellitus with ESRD (end-stage renal disease) ([place])   [place] [facility], Jolene T, NP   9 months ago Type 2 diabetes mellitus with ESRD (end-stage renal disease) ([place])   [facility], [name], DO   1 year ago Type 2 diabetes mellitus with ESRD (end-stage renal disease) ([place])   [facility], [name], DO      Future Appointments            Tomorrow Theora Gianotti, NP [facility], [facility]   In 4 weeks [place], Barbaraann Faster, NP [facility], [facility]           . torsemide (DEMADEX) 100 MG tablet      Sig: Take 1 tablet (100 mg total) by mouth daily.     Cardiovascular:  Diuretics - Loop Failed - 05/19/2022  1:11 PM      Failed - Cr in normal range and within 180 days    Creat  Date Value Ref Range Status  12/27/2017 7.43  (H) 0.50 - 1.05 mg/dL Final    Comment:    For patients >67 years of age, the reference limit for Creatinine is approximately 13% higher for people identified as African-American. .    Creatinine, Ser  Date Value Ref Range Status  03/18/2022 3.76 (H) 0.57 - 1.00 mg/dL Final         Failed - Mg Level in normal range and within 180 days    Magnesium  Date Value Ref Range Status  11/11/2021 2.1 1.6 - 2.3 mg/dL Final  12/29/2012 1.7 (L) mg/dL Final    Comment:    1.8-2.4 THERAPEUTIC RANGE: 4-7 mg/dL TOXIC: > 10 mg/dL  -----------------------          Failed - Last BP in normal range    BP Readings from Last 1 Encounters:  04/16/22 (!) 179/77         Passed - K in normal range and within 180 days    Potassium  Date Value Ref Range Status  03/18/2022 4.3 3.5 - 5.2 mmol/L Final  10/31/2014 4.7 3.5 - 5.1 mmol/L Final   Potassium [facility] vascular lab)  Date Value Ref Range Status  04/23/2020 5.0 3.5 - 5.1 Final    Comment:    Performed at [facility], [address] [place], [place] [address]         [place] in normal range and within 180 days    Calcium  Date Value Ref Range Status  03/18/2022 8.7 8.7 - 10.2 mg/dL Final   Calcium, Total  Date Value Ref Range Status  10/31/2014 7.7 (L) 8.5 - 10.1 mg/dL Final         Passed - Na in normal range and within 180 days    Sodium  Date Value Ref Range Status  03/18/2022 138 134 - 144 mmol/L Final  10/31/2014 133 (L) 136 - 145 mmol/L Final         Passed - Cl in normal range and within 180 days    Chloride  Date Value Ref Range Status  03/18/2022 99 96 - 106 mmol/L Final  10/31/2014 102 98 - 107 mmol/L Final         Passed - Valid encounter within last 6 months    Recent Outpatient Visits          1 month ago Hypertension associated with diabetes (HCC)   [facility] [place], Jolene T, NP   2 months ago Type 2 diabetes mellitus with ESRD (end-stage renal disease) (HCC)   [facility] [place], Jolene T, NP   6 months ago Type 2 diabetes mellitus with ESRD (end-stage renal disease) (HCC)   [facility] Cann[place], Jolene T, NP   9 months ago Type 2 diabetes mellitus with ESRD (end-stage renal disease) (HCC)   [facility] Smitty Cords, DO   1 year ago Type 2 diabetes mellitus with ESRD (end-stage renal disease) (HCC)   [facility], Netta Neat, DO      Future Appointments            Tomorrow Brion Aliment, Dois Davenport, NP [facility], [facility]   In 4 weeks [place], Dorie Rank, NP [facility], [facility]           . heparin 1000 unit/ml SOLN injection      Sig: Inject into the peritoneum as needed. Three times per week before dialysis     Off-Protocol Failed - 05/19/2022  1:11 PM      Failed - Medication not assigned to a protocol, review manually.      Passed - Valid encounter within last 12 months    Recent Outpatient Visits          1 month ago Hypertension associated with diabetes (HCC)   [facility] [place], Jolene T, NP   2 months ago Type 2 diabetes mellitus with ESRD (end-stage renal disease) (HCC)   [facility] Cann[place], Jolene T, NP   6 months ago Type 2 diabetes mellitus with ESRD (end-stage renal disease) (HCC)   [facility] Cann[place], Jolene T, NP   9 months ago Type 2 diabetes mellitus with ESRD (end-stage renal disease) (HCC)   [facility], Netta Neat, DO   1 year ago Type 2 diabetes mellitus with ESRD (end-stage renal disease) (HCC)   [facility], Netta Neat, DO      Future Appointments            Tomorrow Brion Aliment Dois Davenport, NP [facility], [facility]   In 4 weeks Marjie Skiff, NP [facility], [facility]          Off-Protocol Failed - 05/19/2022  1:11 PM      Failed - Medication not assigned to a protocol,  review manually.      Passed - Valid encounter within last 12 months    Recent Outpatient Visits          1 month ago Hypertension associated with diabetes (HCC)   [facility] [place], Jolene T, NP   2 months ago Type 2 diabetes mellitus with ESRD (end-stage renal disease) (HCC)   [facility] [place], Jolene T, NP   6 months ago Type 2 diabetes mellitus with ESRD (end-stage renal disease) (HCC)  [facility] [place], [place] T, NP   9 months ago Type 2 diabetes mellitus with ESRD (end-stage renal disease) ([place])   [facility] Olin Hauser, DO   1 year ago Type 2 diabetes mellitus with ESRD (end-stage renal disease) ([place])   [facility], [name], DO      Future Appointments            Tomorrow Sharolyn Douglas, Clance Boll, NP [facility], [place]   In 4 weeks Venita Lick, NP [facility], [facility]           . promethazine (PHENERGAN) 25 MG tablet 30 tablet     Sig: Take 1 tablet (25 mg total) by mouth 2 (two) times daily as needed.     Not Delegated - Gastroenterology: Antiemetics Failed - 05/19/2022  1:11 PM      Failed - This refill cannot be delegated      Passed - Valid encounter within last 6 months    Recent Outpatient Visits          1 month ago Hypertension associated with diabetes ([place])   [place], Jolene T, NP   2 months ago Type 2 diabetes mellitus with ESRD (end-stage renal disease) ([place])   [place], Jolene T, NP   6 months ago Type 2 diabetes mellitus with ESRD (end-stage renal disease) ([place])   [place] [facility], Jolene T, NP   9 months ago Type 2 diabetes mellitus with ESRD (end-stage renal disease) ([place])   [facility], [name], DO   1 year ago Type 2 diabetes mellitus with ESRD (end-stage renal disease) ([place])   [facility], [name], DO      Future Appointments            Tomorrow Theora Gianotti, NP [facility], [facility]   In 4 weeks [place], Henrine Screws T, NP [facility], [facility]           . latanoprost (XALATAN) 0.005 % ophthalmic solution 2.5 mL     Sig: Place 1 drop into the left eye daily.     Ophthalmology:  Glaucoma Passed - 05/19/2022  1:11 PM      Passed - Valid encounter within last 12 months    Recent Outpatient Visits          1 month ago Hypertension associated with diabetes ([place])   [place], Jolene T, NP   2 months ago Type 2 diabetes mellitus with ESRD (end-stage renal disease) ([place])   [place] Cannady, Jolene T, NP   6 months ago Type 2 diabetes mellitus with ESRD (end-stage renal disease) ([place])   [place] Cannady, Jolene T, NP   9 months ago Type 2 diabetes mellitus with ESRD (end-stage renal disease) ([place])   [facility], [name], DO   1 year ago Type 2 diabetes mellitus with ESRD (end-stage renal disease) ([place])   [facility] Olin Hauser, DO      Future Appointments            Tomorrow Sharolyn Douglas, Clance Boll, NP [facility], [facility]   In 4 weeks [place], Henrine Screws T, NP [facility], [facility]           . busPIRone (BUSPAR) 5 MG tablet 180 tablet     Sig: Take 2 tablets (10  mg total) by mouth 2 (two) times daily as needed.     Psychiatry: Anxiolytics/Hypnotics - Non-controlled Passed - 05/19/2022  1:11 PM      Passed - Valid encounter within last 12 months    Recent Outpatient Visits          1 month ago Hypertension associated with diabetes ([place])   [place], Jolene T, NP   2 months ago Type 2 diabetes mellitus with ESRD (end-stage renal disease) ([place])   [place] Cannady, Jolene T, NP   6 months ago Type 2 diabetes mellitus with ESRD  (end-stage renal disease) ([place])   [place] Cann[facility], Jolene T, NP   9 months ago Type 2 diabetes mellitus with ESRD (end-stage renal disease) ([place])   [facility] Olin Hauser, DO   1 year ago Type 2 diabetes mellitus with ESRD (end-stage renal disease) ([place])   [facility], [name], DO      Future Appointments            Tomorrow Sharolyn Douglas, Clance Boll, NP [facility], [place]   In 4 weeks [place], Barbaraann Faster, NP [facility], [facility]           . NOVOLOG 100 UNIT/ML injection 10 mL 10    Sig: Inject 5-10 Units into the skin 3 (three) times daily before meals.     Endocrinology:  Diabetes - Insulins Passed - 05/19/2022  1:11 PM      Passed - HBA1C is between 0 and 7.9 and within 180 days    Hemoglobin A1C  Date Value Ref Range Status  07/20/2021 8.2  Final    Comment:    [facility]   HB A1C (BAYER DCA - WAIVED)  Date Value Ref Range Status  03/18/2022 7.5 (H) 4.8 - 5.6 % Final    Comment:             Prediabetes: 5.7 - 6.4          Diabetes: >6.4          Glycemic control for adults with diabetes: <7.0          Passed - Valid encounter within last 6 months    Recent Outpatient Visits          1 month ago Hypertension associated with diabetes ([place])   [place], Jolene T, NP   2 months ago Type 2 diabetes mellitus with ESRD (end-stage renal disease) ([place])   [place], Jolene T, NP   6 months ago Type 2 diabetes mellitus with ESRD (end-stage renal disease) ([place])   [place], Jolene T, NP   9 months ago Type 2 diabetes mellitus with ESRD (end-stage renal disease) ([place])   [facility] Olin Hauser, DO   1 year ago Type 2 diabetes mellitus with ESRD (end-stage renal disease) ([place])   [facility], [name], DO      Future Appointments             Tomorrow Sharolyn Douglas, Clance Boll, NP [facility] [place], [place]   In 4 weeks [place], Barbaraann Faster, NP [facility] [facility], [facility]           . insulin glargine (LANTUS) 100 UNIT/ML injection 10 mL 5    Sig: INJECT 10 UNITS SUBCUTANEOUSLY ONCE A DAY     Endocrinology:  Diabetes - Insulins Passed - 05/19/2022  1:11 PM      Passed - HBA1C is between 0 and 7.9 and within 180 days    Hemoglobin A1C  Date Value Ref Range Status  07/20/2021 8.2  Final    Comment:    [facility]   HB A1C (BAYER DCA - WAIVED)  Date Value Ref Range Status  03/18/2022 7.5 (H) 4.8 - 5.6 % Final    Comment:             Prediabetes: 5.7 - 6.4          Diabetes: >6.4          Glycemic control for adults with diabetes: <7.0          Passed - Valid encounter within last 6 months    Recent Outpatient Visits          1 month ago Hypertension associated with diabetes ([place])   [place], Jolene T, NP   2 months ago Type 2 diabetes mellitus with ESRD (end-stage renal disease) ([place])   [place] [facility], Jolene T, NP   6 months ago Type 2 diabetes mellitus with ESRD (end-stage renal disease) ([place])   [place], Jolene T, NP   9 months ago Type 2 diabetes mellitus with ESRD (end-stage renal disease) ([place])   [facility], [name], DO   1 year ago Type 2 diabetes mellitus with ESRD (end-stage renal disease) ([place])   [facility] [place], [name], DO      Future Appointments            Tomorrow Theora Gianotti, NP [facility], [facility]   In 4 weeks [place], Barbaraann Faster, NP [facility], [facility]           . levocetirizine (XYZAL) 5 MG tablet 90 tablet 3    Sig: Take 1 tablet (5 mg total) by mouth every evening.     Ear, Nose, and Throat:  Antihistamines - levocetirizine dihydrochloride Failed - 05/19/2022  1:11 PM      Failed - Cr in  normal range and within 360 days    Creat  Date Value Ref Range Status  12/27/2017 7.43 (H) 0.50 - 1.05 mg/dL Final    Comment:    For patients >78 years of age, the reference limit for Creatinine is approximately 13% higher for people identified as African-American. .    Creatinine, Ser  Date Value Ref Range Status  03/18/2022 3.76 (H) 0.57 - 1.00 mg/dL Final         Passed - eGFR is 10 or above and within 360 days    GFR, Est African American  Date Value Ref Range Status  12/27/2017 7 (L) > OR = 60 mL/min/1.72m2 Final   GFR calc Af Amer  Date Value Ref Range Status  05/01/2019 6 (L) >60 mL/min Final   GFR, Est Non African American  Date Value Ref Range Status  12/27/2017 6 (L) > OR = 60 mL/min/1.53m2 Final   GFR calc non Af Amer  Date Value Ref Range Status  05/01/2019 6 (L) >60 mL/min Final   eGFR  Date Value Ref Range Status  03/18/2022 13 (L) >59 mL/min/1.73 Final         Passed - Valid encounter within last 12 months    Recent Outpatient Visits          1 month ago Hypertension associated with diabetes ([place])   [place]  Marnee Guarneri T, NP   2 months ago Type 2 diabetes mellitus with ESRD (end-stage renal disease) ([place])   [place], Jolene T, NP   6 months ago Type 2 diabetes mellitus with ESRD (end-stage renal disease) ([place])   [place] Cann[place], Jolene T, NP   9 months ago Type 2 diabetes mellitus with ESRD (end-stage renal disease) ([place])   [facility] Olin Hauser, DO   1 year ago Type 2 diabetes mellitus with ESRD (end-stage renal disease) ([place])   [facility], [name], DO      Future Appointments            Tomorrow Sharolyn Douglas, Clance Boll, NP [facility], [place]   In 4 weeks Venita Lick, NP [employer], [facility]           . INSULIN SYRINGE .5CC/29G (TRUEPLUS INSULIN SYRINGE) 29G X 1/2" 0.5 ML MISC  100 each 4    Sig: USE 4 TIMES A DAY WITH LANTUS & NOVOLOG     There is no refill protocol information for this order    . omeprazole (PRILOSEC) 40 MG capsule 90 capsule 3    Sig: Take 1 capsule (40 mg total) by mouth daily.     Gastroenterology: Proton Pump Inhibitors Passed - 05/19/2022  1:11 PM      Passed - Valid encounter within last 12 months    Recent Outpatient Visits          1 month ago Hypertension associated with diabetes ([place])   [place], Jolene T, NP   2 months ago Type 2 diabetes mellitus with ESRD (end-stage renal disease) ([place])   [place] Cannady, Jolene T, NP   6 months ago Type 2 diabetes mellitus with ESRD (end-stage renal disease) ([place])   [place] Cann[facility], Jolene T, NP   9 months ago Type 2 diabetes mellitus with ESRD (end-stage renal disease) ([place])   [facility] Olin Hauser, DO   1 year ago Type 2 diabetes mellitus with ESRD (end-stage renal disease) ([place])   [facility], [name], DO      Future Appointments            Tomorrow Sharolyn Douglas, Clance Boll, NP [facility], [place]   In 4 weeks [place], Barbaraann Faster, NP [facility] [facility], [facility]           . glucose blood test strip 100 each 11    Sig: Use to check blood sugar 3 times daily, fasting in morning with goal <130 and 2 hours after meals with goal <180.  Bring blood sugar log to visits.     Endocrinology: Diabetes - Testing Supplies Passed - 05/19/2022  1:11 PM      Passed - Valid encounter within last 12 months    Recent Outpatient Visits          1 month ago Hypertension associated with diabetes ([place])   [place], Jolene T, NP   2 months ago Type 2 diabetes mellitus with ESRD (end-stage renal disease) ([place])   [place] Cannady, Jolene T, NP   6 months ago Type 2 diabetes mellitus with ESRD (end-stage renal disease) ([place])    [place] Cannady, Jolene T, NP   9 months ago Type 2 diabetes mellitus with ESRD (end-stage renal disease) ([place])   [facility] [place], Sheppard Coil  J, DO   1 year ago Type 2 diabetes mellitus with ESRD (end-stage renal disease) ([place])   [facility] [place], [name], DO      Future Appointments            Tomorrow Sharolyn Douglas Clance Boll, NP [facility], [facility]   In 4 weeks [place], Barbaraann Faster, NP [facility], [facility]           . oxyCODONE (OXY IR/ROXICODONE) 5 MG immediate release tablet 75 tablet     Sig: Take 1 tablet (5 mg total) by mouth every 8 (eight) hours as needed for severe pain. Must last 30 days.     Not Delegated - Analgesics:  Opioid Agonists Failed - 05/19/2022  1:11 PM      Failed - This refill cannot be delegated      Failed - Urine Drug Screen completed in last 360 days      Passed - Valid encounter within last 3 months    Recent Outpatient Visits          1 month ago Hypertension associated with diabetes ([place])   [place], Jolene T, NP   2 months ago Type 2 diabetes mellitus with ESRD (end-stage renal disease) ([place])   [place] Cannady, Jolene T, NP   6 months ago Type 2 diabetes mellitus with ESRD (end-stage renal disease) ([place])   [place] Cannady, Jolene T, NP   9 months ago Type 2 diabetes mellitus with ESRD (end-stage renal disease) ([place])   [facility] Olin Hauser, DO   1 year ago Type 2 diabetes mellitus with ESRD (end-stage renal disease) ([place])   [facility], [name], DO      Future Appointments            Tomorrow Sharolyn Douglas, Clance Boll, NP [facility], [place]   In 4 weeks [place], Barbaraann Faster, NP [employer], [facility]           . Cholecalciferol 1.25 MG (50000 UT) TABS 12 tablet     Sig: Take 1 tablet by mouth once a  week.     Endocrinology:  Vitamins - Vitamin D Supplementation 2 Failed - 05/19/2022  1:11 PM      Failed - Manual Review: Route requests for 50,000 IU strength to the provider      Failed - Vitamin D in normal range and within 360 days    Vit D, 25-Hydroxy  Date Value Ref Range Status  03/18/2022 6.5 (L) 30.0 - 100.0 ng/mL Final    Comment:    Vitamin D deficiency has been defined by the [place] practice guideline as a level of serum 25-OH vitamin D less than 20 ng/mL (1,2). The Endocrine Society went on to further define vitamin D insufficiency as a level between 21 and 29 ng/mL (2). 1. IOM (Institute of Medicine). 2010. Dietary reference    intakes for calcium and D. [place]: The    [employer]. 2. Holick MF, Binkley [place], Bischoff-Ferrari HA, et al.    Evaluation, treatment, and prevention of vitamin D    deficiency: an Endocrine Society clinical practice    guideline. JCEM. 2011 Jul; 96(7):1911-30.          Passed - Ca in normal range and within 360 days    Calcium  Date Value Ref Range Status  03/18/2022 8.7 8.7 - 10.2  mg/dL Final   Calcium, Total  Date Value Ref Range Status  10/31/2014 7.7 (L) 8.5 - 10.1 mg/dL Final         Passed - Valid encounter within last 12 months    Recent Outpatient Visits          1 month ago Hypertension associated with diabetes ([place])   [place], Jolene T, NP   2 months ago Type 2 diabetes mellitus with ESRD (end-stage renal disease) ([place])   [place] Cannady, Jolene T, NP   6 months ago Type 2 diabetes mellitus with ESRD (end-stage renal disease) ([place])   [place] Cannady, Jolene T, NP   9 months ago Type 2 diabetes mellitus with ESRD (end-stage renal disease) ([place])   [facility], [name], DO   1 year ago Type 2 diabetes mellitus with ESRD (end-stage renal disease) ([place])   [facility], [name], DO      Future Appointments            Tomorrow Theora Gianotti, NP [facility], [facility]   In 4 weeks [place], Barbaraann Faster, NP [facility], [facility]           . diltiazem (CARDIZEM) 90 MG tablet 120 tablet 11    Sig: Take 1 tablet (90 mg total) by mouth 4 (four) times daily.     Cardiovascular: Calcium Channel Blockers 3 Failed - 05/19/2022  1:11 PM      Failed - Cr in normal range and within 360 days    Creat  Date Value Ref Range Status  12/27/2017 7.43 (H) 0.50 - 1.05 mg/dL Final    Comment:    For patients >29 years of age, the reference limit for Creatinine is approximately 13% higher for people identified as African-American. .    Creatinine, Ser  Date Value Ref Range Status  03/18/2022 3.76 (H) 0.57 - 1.00 mg/dL Final         Failed - Last BP in normal range    BP Readings from Last 1 Encounters:  04/16/22 (!) 179/77         Passed - ALT in normal range and within 360 days    ALT  Date Value Ref Range Status  03/18/2022 16 0 - 32 IU/L Final   SGPT (ALT)  Date Value Ref Range Status  10/15/2014 54 U/L Final    Comment:    14-63 NOTE: New Reference Range 07/10/14    ALT (SGPT) P5P  Date Value Ref Range Status  07/11/2019 28 0 - 40 IU/L Final         Passed - AST in normal range and within 360 days    AST  Date Value Ref Range Status  03/18/2022 20 0 - 40 IU/L Final   SGOT(AST)  Date Value Ref Range Status  10/15/2014 46 (H) 15 - 37 Unit/L Final         Passed - Last Heart Rate in normal range    Pulse Readings from Last 1 Encounters:  04/16/22 73         Passed - Valid encounter within last 6 months    Recent Outpatient Visits          1 month ago Hypertension associated with diabetes ([place])   [place], Jolene T, NP   2 months ago Type 2 diabetes mellitus with ESRD (end-stage renal disease) ([place])   [facility] [place], Henrine Screws T, NP    6 months ago Type 2 diabetes mellitus with ESRD (end-stage renal disease) ([place])   [place], Jolene T, NP   9 months ago Type 2 diabetes mellitus with ESRD (end-stage renal disease) ([place])   [facility] Olin Hauser, DO   1 year ago Type 2 diabetes mellitus with ESRD (end-stage renal disease) ([place])   [facility], [name], DO      Future Appointments            Tomorrow Sharolyn Douglas, Clance Boll, NP [facility], [facility]   In 4 weeks [place], Barbaraann Faster, NP [facility], [facility]           . carvedilol (COREG) 3.125 MG tablet 180 tablet     Sig: Take 1 tablet (3.125 mg total) by mouth 2 (two) times daily with a meal.     Cardiovascular: Beta Blockers 3 Failed - 05/19/2022  1:11 PM      Failed - Cr in normal range and within 360 days    Creat  Date Value Ref Range Status  12/27/2017 7.43 (H) 0.50 - 1.05 mg/dL Final    Comment:    For patients >27 years of age, the reference limit for Creatinine is approximately 13% higher for people identified as African-American. .    Creatinine, Ser  Date Value Ref Range Status  03/18/2022 3.76 (H) 0.57 - 1.00 mg/dL Final         Failed - Last BP in normal range    BP Readings from Last 1 Encounters:  04/16/22 (!) 179/77         Passed - AST in normal range and within 360 days    AST  Date Value Ref Range Status  03/18/2022 20 0 - 40 IU/L Final   SGOT(AST)  Date Value Ref Range Status  10/15/2014 46 (H) 15 - 37 Unit/L Final         Passed - ALT in normal range and within 360 days    ALT  Date Value Ref Range Status  03/18/2022 16 0 - 32 IU/L Final   SGPT (ALT)  Date Value Ref Range Status  10/15/2014 54 U/L Final    Comment:    14-63 NOTE: New Reference Range 07/10/14    ALT (SGPT) P5P  Date Value Ref Range Status  07/11/2019 28 0 - 40 IU/L Final         Passed - Last Heart Rate in normal range    Pulse  Readings from Last 1 Encounters:  04/16/22 73         Passed - Valid encounter within last 6 months    Recent Outpatient Visits          1 month ago Hypertension associated with diabetes ([place])   [place], Jolene T, NP   2 months ago Type 2 diabetes mellitus with ESRD (end-stage renal disease) ([place])   [place] Cannady, Jolene T, NP   6 months ago Type 2 diabetes mellitus with ESRD (end-stage renal disease) ([place])   [place], Jolene T, NP   9 months ago Type 2 diabetes mellitus with ESRD (end-stage renal disease) ([place])   [facility] Olin Hauser, DO   1 year ago Type 2 diabetes mellitus with ESRD (end-stage renal disease) ([place])   [facility] [place], [name], DO  Future Appointments            [place], Clance Boll, NP [facility], [facility]   In 4 weeks [place], Barbaraann Faster, NP [facility], [facility]           . Incontinence Supply Disposable (FQ PROTECTIVE UNDERWEAR) MISC 72 each 10    Sig: 1 CSE PU LARGE PLUS USING DAILY AS NEEDED ADD WIPES MD=KARAMALEGOS-01/02/20 PER PT     There is no refill protocol information for this order

## 2022-05-20 NOTE — Telephone Encounter (Signed)
Called pt a second time and LM for her to call cardiologist for Zetia prescription. Advised pt to call pain clinic [facility] and to call nephrologist regarding [facility].  Routing other medications to PCP.

## 2022-05-20 NOTE — Telephone Encounter (Signed)
Pt returned call -  reviewed meds that have been refilled.  Pt will pick those up today.

## 2022-05-20 NOTE — Telephone Encounter (Signed)
Left voicemail with patient just to touch base as to exactly we are needing to do for her.    [facility] and they currently are out of oxycodone 5 mg and unsure as to when they will get anymore in.  I will wait to speak to patient again to be sure that this is what she would like to do and then wait for them to receive medicine.

## 2022-05-21 ENCOUNTER — Ambulatory Visit: Payer: 59 | Admitting: Nurse Practitioner

## 2022-05-21 ENCOUNTER — Other Ambulatory Visit: Payer: Self-pay | Admitting: Nurse Practitioner

## 2022-05-21 MED ORDER — FQ PROTECTIVE UNDERWEAR MISC: 11 refills | Status: DC

## 2022-05-21 MED ORDER — MIDODRINE HCL 10 MG PO TABS: 5.0000 mg | ORAL_TABLET | Freq: Every day | ORAL | 1 refills | Status: DC | PRN

## 2022-05-21 MED ORDER — BUSPIRONE HCL 5 MG PO TABS: 10.0000 mg | ORAL_TABLET | Freq: Two times a day (BID) | ORAL | 4 refills | Status: DC | PRN

## 2022-05-21 MED ORDER — CHOLECALCIFEROL 1.25 MG (50000 UT) PO TABS: 1.0000 | ORAL_TABLET | ORAL | 4 refills | Status: DC

## 2022-05-21 MED ORDER — TORSEMIDE 100 MG PO TABS: 100.0000 mg | ORAL_TABLET | Freq: Every day | ORAL | 4 refills | Status: DC

## 2022-05-21 MED ORDER — PROMETHAZINE HCL 25 MG PO TABS: 25.0000 mg | ORAL_TABLET | Freq: Two times a day (BID) | ORAL | 4 refills | Status: DC | PRN

## 2022-05-25 ENCOUNTER — Telehealth: Payer: Self-pay | Admitting: Student in an Organized Health Care Education/Training Program

## 2022-05-25 NOTE — Telephone Encounter (Signed)
Patient stated that pharmacy [facility]'t [facility] [place]. Please give patient a call. Thanks

## 2022-05-25 NOTE — Telephone Encounter (Signed)
Message left for patient to please find a pharmacy that does have qty to fill and let us know and we will be glad to resend the Rx.

## 2022-05-26 ENCOUNTER — Telehealth: Payer: Self-pay

## 2022-05-26 NOTE — Telephone Encounter (Signed)
Spoke with patient.  She states that she called this morning and gave [place] the pharmacy [facility] in [place].  We did not get that message sent to [place].  I called [facility] in [place] to see if they had the supply.  They states they have it right now but cant guarantee that they will have it in the morning.  Dr Holley Raring has left for the day and states he will do it tomorrow.  I informed the patient to call [facility] in the morning and to [facility] to see if they have the medication in stock.  Informed her to call and ask for me and I will get it taken care of if she finds the pharmacy who has it in stock.

## 2022-05-27 ENCOUNTER — Telehealth: Payer: Self-pay | Admitting: Student in an Organized Health Care Education/Training Program

## 2022-05-27 ENCOUNTER — Encounter: Payer: Self-pay | Admitting: Student in an Organized Health Care Education/Training Program

## 2022-05-27 ENCOUNTER — Other Ambulatory Visit: Payer: Self-pay

## 2022-05-27 ENCOUNTER — Encounter: Payer: Self-pay | Admitting: Nurse Practitioner

## 2022-05-27 DIAGNOSIS — G894 Chronic pain syndrome: Secondary | ICD-10-CM

## 2022-05-27 DIAGNOSIS — E1142 Type 2 diabetes mellitus with diabetic polyneuropathy: Secondary | ICD-10-CM

## 2022-05-27 DIAGNOSIS — M5412 Radiculopathy, cervical region: Secondary | ICD-10-CM

## 2022-05-27 MED ORDER — OXYCODONE HCL 5 MG PO TABS: 5.0000 mg | ORAL_TABLET | Freq: Three times a day (TID) | ORAL | 0 refills | Status: DC | PRN

## 2022-05-27 NOTE — Telephone Encounter (Signed)
Pt called stated she was told to call back today to speak directly to [facility] about her meds. Pt was upset and stated that she will be calling back in 15 if she haven't got an call back. Please give pt a call. Thanks

## 2022-05-27 NOTE — Telephone Encounter (Signed)
Refill request sent to Dr Lateef.  

## 2022-06-04 ENCOUNTER — Ambulatory Visit (INDEPENDENT_AMBULATORY_CARE_PROVIDER_SITE_OTHER): Payer: 59

## 2022-06-04 DIAGNOSIS — R9439 Abnormal result of other cardiovascular function study: Secondary | ICD-10-CM

## 2022-06-04 LAB — ECHOCARDIOGRAM COMPLETE
AR max vel: 2.62 cm2
AV Area VTI: 2.67 cm2
AV Area mean vel: 2.57 cm2
AV Mean grad: 4 mmHg
AV Peak grad: 7.4 mmHg
Ao pk vel: 1.36 m/s
Area-P 1/2: 3.63 cm2
Calc EF: 50 %
S' Lateral: 3.4 cm
Single Plane A2C EF: 54.2 %
Single Plane A4C EF: 49.2 %

## 2022-06-05 ENCOUNTER — Telehealth: Payer: Self-pay | Admitting: *Deleted

## 2022-06-05 NOTE — Telephone Encounter (Signed)
-----   Message from Theora Gianotti, NP sent at 06/04/2022  5:32 PM EDT ----- Normal heart squeezing function.  The mitral valve is mildly to moderately leaky.  Overall reassuring findings.

## 2022-06-05 NOTE — Telephone Encounter (Signed)
Left voicemail message to call back for review of her results.  

## 2022-06-05 NOTE — Telephone Encounter (Signed)
Reviewed results with patient and she verbalized understanding with no further questions at this time.  

## 2022-06-15 ENCOUNTER — Telehealth: Payer: Self-pay | Admitting: Nurse Practitioner

## 2022-06-18 ENCOUNTER — Ambulatory Visit: Payer: 59 | Admitting: Nurse Practitioner

## 2022-06-18 DIAGNOSIS — E559 Vitamin D deficiency, unspecified: Secondary | ICD-10-CM

## 2022-06-18 DIAGNOSIS — B182 Chronic viral hepatitis C: Secondary | ICD-10-CM

## 2022-06-18 DIAGNOSIS — G894 Chronic pain syndrome: Secondary | ICD-10-CM

## 2022-06-18 DIAGNOSIS — N186 End stage renal disease: Secondary | ICD-10-CM

## 2022-06-18 DIAGNOSIS — E1122 Type 2 diabetes mellitus with diabetic chronic kidney disease: Secondary | ICD-10-CM

## 2022-06-18 DIAGNOSIS — F17219 Nicotine dependence, cigarettes, with unspecified nicotine-induced disorders: Secondary | ICD-10-CM

## 2022-06-18 DIAGNOSIS — F3341 Major depressive disorder, recurrent, in partial remission: Secondary | ICD-10-CM

## 2022-06-18 DIAGNOSIS — E1142 Type 2 diabetes mellitus with diabetic polyneuropathy: Secondary | ICD-10-CM

## 2022-06-18 DIAGNOSIS — M0579 Rheumatoid arthritis with rheumatoid factor of multiple sites without organ or systems involvement: Secondary | ICD-10-CM

## 2022-06-18 DIAGNOSIS — F411 Generalized anxiety disorder: Secondary | ICD-10-CM

## 2022-06-18 DIAGNOSIS — I152 Hypertension secondary to endocrine disorders: Secondary | ICD-10-CM

## 2022-06-18 DIAGNOSIS — E1169 Type 2 diabetes mellitus with other specified complication: Secondary | ICD-10-CM

## 2022-06-25 ENCOUNTER — Other Ambulatory Visit: Payer: Self-pay | Admitting: *Deleted

## 2022-06-25 ENCOUNTER — Telehealth: Payer: Self-pay | Admitting: Student in an Organized Health Care Education/Training Program

## 2022-06-25 DIAGNOSIS — M5412 Radiculopathy, cervical region: Secondary | ICD-10-CM

## 2022-06-25 DIAGNOSIS — G894 Chronic pain syndrome: Secondary | ICD-10-CM

## 2022-06-25 DIAGNOSIS — E1142 Type 2 diabetes mellitus with diabetic polyneuropathy: Secondary | ICD-10-CM

## 2022-06-25 MED ORDER — OXYCODONE HCL 5 MG PO TABS: 5.0000 mg | ORAL_TABLET | Freq: Three times a day (TID) | ORAL | 0 refills | Status: DC | PRN

## 2022-06-25 NOTE — Telephone Encounter (Signed)
[facility] [place] [facility] that she in. Patient wants med to be called in. Please give patient a call. Thanks

## 2022-06-25 NOTE — Telephone Encounter (Signed)
Patient aware that medication has been sent.

## 2022-06-26 ENCOUNTER — Telehealth: Payer: Self-pay | Admitting: *Deleted

## 2022-06-26 DIAGNOSIS — E782 Mixed hyperlipidemia: Secondary | ICD-10-CM

## 2022-06-30 ENCOUNTER — Encounter: Payer: Self-pay | Admitting: Student in an Organized Health Care Education/Training Program

## 2022-06-30 ENCOUNTER — Ambulatory Visit (INDEPENDENT_AMBULATORY_CARE_PROVIDER_SITE_OTHER): Payer: 59 | Admitting: Nurse Practitioner

## 2022-06-30 ENCOUNTER — Encounter: Payer: Self-pay | Admitting: Nurse Practitioner

## 2022-06-30 ENCOUNTER — Ambulatory Visit
Payer: 59 | Attending: Student in an Organized Health Care Education/Training Program | Admitting: Student in an Organized Health Care Education/Training Program

## 2022-06-30 VITALS — BP 137/59 | HR 70 | Temp 97.4°F | Resp 16 | Ht 66.0 in | Wt 230.0 lb

## 2022-06-30 VITALS — BP 138/68 | HR 66 | Ht 66.0 in | Wt 232.8 lb

## 2022-06-30 DIAGNOSIS — M5412 Radiculopathy, cervical region: Secondary | ICD-10-CM | POA: Diagnosis present

## 2022-06-30 DIAGNOSIS — E782 Mixed hyperlipidemia: Secondary | ICD-10-CM

## 2022-06-30 DIAGNOSIS — R072 Precordial pain: Secondary | ICD-10-CM

## 2022-06-30 DIAGNOSIS — I10 Essential (primary) hypertension: Secondary | ICD-10-CM

## 2022-06-30 DIAGNOSIS — I2510 Atherosclerotic heart disease of native coronary artery without angina pectoris: Secondary | ICD-10-CM

## 2022-06-30 DIAGNOSIS — Z992 Dependence on renal dialysis: Secondary | ICD-10-CM

## 2022-06-30 DIAGNOSIS — I5032 Chronic diastolic (congestive) heart failure: Secondary | ICD-10-CM

## 2022-06-30 DIAGNOSIS — N186 End stage renal disease: Secondary | ICD-10-CM

## 2022-06-30 DIAGNOSIS — E1142 Type 2 diabetes mellitus with diabetic polyneuropathy: Secondary | ICD-10-CM | POA: Diagnosis present

## 2022-06-30 DIAGNOSIS — I340 Nonrheumatic mitral (valve) insufficiency: Secondary | ICD-10-CM

## 2022-06-30 DIAGNOSIS — G894 Chronic pain syndrome: Secondary | ICD-10-CM | POA: Diagnosis present

## 2022-06-30 MED ORDER — OXYCODONE HCL 5 MG PO TABS: 5.0000 mg | ORAL_TABLET | Freq: Three times a day (TID) | ORAL | 0 refills | Status: DC | PRN

## 2022-06-30 NOTE — Patient Instructions (Signed)
Medication Instructions:  No changes at this time.   *If you need a refill on your cardiac medications before your next appointment, please call your pharmacy*   Lab Work: None  If you have labs (blood work) drawn today and your tests are completely normal, you will receive your results only by: [place] (if you have MyChart) OR A paper copy in the mail If you have any lab test that is abnormal or we need to change your treatment, we will call you to review the results.   Testing/Procedures: None   Follow-Up: At [facility], you and your health needs are our priority.  As part of our continuing mission to provide you with exceptional heart care, we have created designated Provider Care Teams.  These Care Teams include your primary Cardiologist (physician) and Advanced Practice Providers (APPs -  Physician Assistants and Nurse Practitioners) who all work together to provide you with the care you need, when you need it.   Your next appointment:   6-8 week(s)  The format for your next appointment:   In Person  Provider:   Kathlyn [place], MD or Murray Hodgkins, NP      Important Information About [place]

## 2022-06-30 NOTE — Progress Notes (Signed)
PROVIDER NOTE: Information contained herein reflects review and annotations entered in association with encounter. Interpretation of such information and data should be left to medically-trained personnel. Information provided to patient can be located elsewhere in the medical record under "Patient Instructions". Document created using STT-dictation technology, any transcriptional errors that may result from process are unintentional.    Patient: [name]  Service Category: E/M  Provider: Gillis Santa, MD  DOB: [birth year]  DOS: 06/30/2022  Specialty: Interventional Pain Management  MRN: [id number]  Setting: Ambulatory outpatient  PCP: Venita Lick, NP  Type: Established Patient    Referring Provider: Venita Lick, NP  Location: Office  Delivery: Face-to-face     HPI  [name], a 56 y.o. year old female, is here today because of her Diabetic polyneuropathy associated with type 2 diabetes mellitus ([place]) [E11.42]. [name] primary complain today is Neck Pain and Back Pain Last encounter: My last encounter with her was on 03/05/2022. Pertinent problems: [name] has ESRD (end stage renal disease) on dialysis [facility]); Type 2 diabetes mellitus with ESRD (end-stage renal disease) ([place]); Chronic bilateral low back pain; DDD (degenerative disc disease), cervical; Spondylosis of cervical region without myelopathy or radiculopathy; PAD (peripheral artery disease) ([place]); Chronic pain syndrome; Diabetic polyneuropathy associated with type 2 diabetes mellitus ([place]); Cervical radiculopathy; Lumbar radiculopathy; GAD (generalized anxiety disorder); and Recurrent major depression in partial remission (HCC) on their pertinent problem list. Pain Assessment: Severity of Chronic pain is reported as a 8 /10. Location: Neck  /down arms bilat to fingers, most significantly left middle finger. Onset: More than a month ago. Quality: Numbness, Pressure (pressure). Timing: Constant. Modifying factor(s): meds,  frequent breaks from standing. Vitals:  height is $RemoveB'5\' 6"'WjpjMVbf$  (1.676 m) and weight is 230 lb (104.3 kg). Her temporal temperature is 97.4 F (36.3 C) (abnormal). Her blood pressure is 137/59 (abnormal) and her pulse is 70. Her respiration is 16 and oxygen saturation is 100%.   Reason for encounter: medication management.  As well as increased cervical radicular pain.  Samina states that she is having increased cervical spine pain that radiates into bilateral arms and hands.  The pain is so severe that she is unable to tolerate dialysis at times.  She is wondering if a neck brace could be helpful for her.  We have tried a cervical epidural steroid injection in the past with limited response.  Her previous cervical MRI done 03/11/2022 shows significant C5-C6 disc protrusion with endplate spurring right greater than left disc osteophyte complex with facet arthrosis and ligamentum flavum hypertrophy.  She has severe spinal canal stenosis and measures 6 mm in AP dimension.  Bilateral neuroforaminal narrowing that is severe, on the right and moderate on the left.  This is a progression from her last cervical MRI.  Based upon this, I have offered the patient repeat cervical epidural injection with saline only and local anesthetic without steroid.  I have also offered a patient a referral to neurosurgery however she states that she needs to follow back up with Dr. Saintclair Halsted who initially ordered lumbar and cervical MRI.  I recommend that she visit a medical device store for a neck brace.  She is instructed to have them fax [place] a prescription request if she finds one that would be beneficial for her.  Pharmacotherapy Assessment  Analgesic: Oxycodone 5 mg q8 hrs prn quantity 75/month; MME equals 22.5    Monitoring: [place] PMP: PDMP reviewed during this encounter.  Pharmacotherapy: No side-effects or adverse reactions reported. Compliance: No problems identified. Effectiveness: Clinically acceptable.  Rise Patience, RN   06/30/2022  8:27 AM  Sign when Signing Visit Nursing Pain Medication Assessment:  Safety precautions to be maintained throughout the outpatient stay will include: orient to surroundings, keep bed in low position, maintain call bell within reach at all times, provide assistance with transfer out of bed and ambulation.  Medication Inspection Compliance: Pill count conducted under aseptic conditions, in front of the patient. Neither the pills nor the bottle was removed from the patient's sight at any time. Once count was completed pills were immediately returned to the patient in their original bottle.  Medication: Oxycodone IR Pill/Patch Count:  67 of 75 pills remain Pill/Patch Appearance: Markings consistent with prescribed medication Bottle Appearance: Standard pharmacy container. Clearly labeled. Filled Date: 07 / 06 / 2023 Last Medication intake:  Yesterday     UDS:  No results found for: "SUMMARY"   ROS  Constitutional: Denies any fever or chills Gastrointestinal: No reported hemesis, hematochezia, vomiting, or acute GI distress Musculoskeletal:  Neck pain with radiation into bilateral arms Neurological: No reported episodes of acute onset apraxia, aphasia, dysarthria, agnosia, amnesia, paralysis, loss of coordination, or loss of consciousness  Medication Review  Abatacept, FQ Protective Underwear, INSULIN SYRINGE .5CC/29G, Vitamin D (Ergocalciferol), brimonidine, busPIRone, diltiazem, dorzolamidel-timolol, ezetimibe, glucose blood, heparin, insulin aspart, insulin glargine, latanoprost, leflunomide, levocetirizine, midodrine, omeprazole, oxyCODONE, promethazine, and torsemide  History Review  Allergy: [name] is allergic to cinnamon, garlic, onion, tylenol [acetaminophen], losartan potassium, amlodipine, ciprofloxacin, clonidine derivatives, ginger, hydralazine, lisinopril, metoprolol, prednisone, and statins. Drug: [name]  reports no history of drug use. Alcohol:  reports no  history of alcohol use. Tobacco:  reports that she quit smoking about 10 months ago. Her smoking use included cigarettes. She has a 5.00 pack-year smoking history. She has never used smokeless tobacco. Social: Ms. Biby  reports that she quit smoking about 10 months ago. Her smoking use included cigarettes. She has a 5.00 pack-year smoking history. She has never used smokeless tobacco. She reports that she does not drink alcohol and does not use drugs. Medical:  has a past medical history of Allergy, Anemia, Anxiety, Arthritis, Ataxia, Chest pain, COPD (chronic obstructive pulmonary disease) ([place]), Depression, Diabetes mellitus with complication ([place]), Diastolic dysfunction, ESRD on hemodialysis ([place]), Fistula, Hepatitis (2003), Hiatal hernia, Hypercholesterolemia, Hypertension, Migraine, Mitral regurgitation, Neuropathy, diabetic ([place]), Non-alcoholic cirrhosis ([place]), Palpitations, Peripheral vascular disease ([place]), Pneumonia (2015), Psoriasis, Tobacco dependence, and Wears dentures. Surgical: Ms. Bulluck  has a past surgical history that includes Tonsillectomy; rt. tubal and ovary removed; Cholecystectomy; Cardiac catheterization (N/A, 07/25/2015); Cardiac catheterization (Left, 07/25/2015); Cardiac catheterization (Left, 10/07/2015); Cardiac catheterization (N/A, 10/07/2015); Cardiac catheterization (10/07/2015); Cardiac catheterization (N/A, 12/17/2015); Incision and drainage abscess (N/A, 07/16/2016); cyst removed  from left hand (Left, 1989); AV fistula placement (Left, 11/2014); Cardiac catheterization (N/A, 01/11/2017); Cardiac catheterization (N/A, 01/11/2017); DIALYSIS/PERMA CATHETER INSERTION (N/A, 05/20/2017); Cesarean section; Colonoscopy (N/A, 09/22/2017); Esophagogastroduodenoscopy (N/A, 09/22/2017); polypectomy (09/22/2017); A/V SHUNT INTERVENTION (N/A, 12/16/2017); STENT PLACEMENT VASCULAR ([facility] HX) (Left, 03/2018); Upper Extremity Angiography (Left, 09/29/2019); Aqueous shunt (Left, 12/20/2019);  Colonoscopy with propofol (N/A, 04/16/2020); A/V Fistulagram (Left, 04/23/2020); and Esophagogastroduodenoscopy (egd) with propofol (N/A, 12/04/2021). Family: family history includes Heart disease in her mother; Hypertension in her father and mother; Skin cancer in her father.  Laboratory Chemistry Profile   Renal Lab Results  Component Value Date   BUN 24 03/18/2022   CREATININE 3.76 (H) 03/18/2022   BCR 6 (L)  03/18/2022   GFRAA 6 (L) 05/01/2019   GFRNONAA 6 (L) 05/01/2019    Hepatic Lab Results  Component Value Date   AST 20 03/18/2022   ALT 16 03/18/2022   ALBUMIN 4.2 03/18/2022   ALKPHOS 134 (H) 03/18/2022   HCVAB >11.0 (H) 12/16/2016   LIPASE 21 04/30/2019    Electrolytes Lab Results  Component Value Date   NA 138 03/18/2022   K 4.3 03/18/2022   CL 99 03/18/2022   CALCIUM 8.7 03/18/2022   MG 2.1 11/11/2021   PHOS 4.2 05/01/2019    Bone Lab Results  Component Value Date   VD25OH 6.5 (L) 03/18/2022    Inflammation (CRP: Acute Phase) (ESR: Chronic Phase) Lab Results  Component Value Date   CRP <0.8 12/16/2016   ESRSEDRATE 117 (H) 10/12/2017   LATICACIDVEN 1.6 04/30/2019         Note: Above Lab results reviewed.  Recent Imaging Review  ECHOCARDIOGRAM COMPLETE    ECHOCARDIOGRAM REPORT       Patient Name:   FAITHLYN RECKTENWALD Date of Exam: 06/04/2022 Medical Rec #:  [id number]     Height:       66.5 in Accession #:    [id number]    Weight:       233.0 lb Date of Birth:  08/03/1966     BSA:          2.146 m Patient Age:    26 years      BP:           179/77 mmHg Patient Gender: F             HR:           66 bpm. Exam Location:  [place]  Procedure: 2D Echo, Cardiac Doppler, Color Doppler and Strain Analysis  Indications:    94.39, Abnormal stress test   History:        Patient has prior history of Echocardiogram examinations, most                 recent 06/09/2018. PAD, Mitral Valve Disease; Risk                 Factors:Hypertension, Diabetes and Former  Smoker.   Sonographer:    Pilar Jarvis RDMS, RVT, RDCS Referring Phys: [id number] CHRISTOPHER RONALD BERGE    Sonographer Comments: Poor [facility] tracking IMPRESSIONS   1. Left ventricular ejection fraction, by estimation, is 60 to 65%. The left ventricle has normal function. The left ventricle has no regional wall motion abnormalities. There is mild left ventricular hypertrophy. Left ventricular diastolic parameters  are consistent with Grade I diastolic dysfunction (impaired relaxation). The average left ventricular global longitudinal strain is -17.3 %. The global longitudinal strain is normal.  2. Right ventricular systolic function is normal. The right ventricular size is normal.  3. Left atrial size was mild to moderately dilated.  4. The mitral valve is normal in structure. Mild to moderate mitral valve regurgitation.  5. The aortic valve was not well visualized. Aortic valve regurgitation is mild. Aortic valve sclerosis is present, with no evidence of aortic valve stenosis.  6. The inferior vena cava is normal in size with greater than 50% respiratory variability, suggesting right atrial pressure of 3 mmHg.  FINDINGS  Left Ventricle: Left ventricular ejection fraction, by estimation, is 60 to 65%. The left ventricle has normal function. The left ventricle has no regional wall motion abnormalities. The average left ventricular global longitudinal strain is -17.3 %.  The global longitudinal strain is normal. 3D left ventricular ejection fraction analysis performed but not reported based on interpreter judgement due to suboptimal tracking. The left ventricular internal cavity size was normal in size. There is mild  left ventricular hypertrophy. Left ventricular diastolic parameters are consistent with Grade I diastolic dysfunction (impaired relaxation).  Right Ventricle: The right ventricular size is normal. No increase in right ventricular wall thickness. Right ventricular systolic function is  normal.  Left Atrium: Left atrial size was mild to moderately dilated.  Right Atrium: Right atrial size was normal in size.  Pericardium: There is no evidence of pericardial effusion.  Mitral Valve: The mitral valve is normal in structure. Mild to moderate mitral valve regurgitation.  Tricuspid Valve: The tricuspid valve is normal in structure. Tricuspid valve regurgitation is mild.  Aortic Valve: The aortic valve was not well visualized. Aortic valve regurgitation is mild. Aortic valve sclerosis is present, with no evidence of aortic valve stenosis. Aortic valve mean gradient measures 4.0 mmHg. Aortic valve peak gradient measures  7.4 mmHg. Aortic valve area, by VTI measures 2.67 cm.  Pulmonic Valve: The pulmonic valve was not well visualized. Pulmonic valve regurgitation is trivial.  Aorta: The aortic root and ascending aorta are structurally normal, with no evidence of dilitation.  Venous: The inferior vena cava is normal in size with greater than 50% respiratory variability, suggesting right atrial pressure of 3 mmHg.  IAS/Shunts: No atrial level shunt detected by color flow Doppler.    LEFT VENTRICLE PLAX 2D LVIDd:         5.10 cm      Diastology LVIDs:         3.40 cm      LV e' medial:    5.44 cm/s LV PW:         1.10 cm      LV E/e' medial:  18.3 LV IVS:        1.10 cm      LV e' lateral:   7.29 cm/s LVOT diam:     2.10 cm      LV E/e' lateral: 13.7 LV SV:         94 LV SV Index:   44           2D Longitudinal Strain LVOT Area:     3.46 cm     2D Strain GLS Avg:     -17.3 %   LV Volumes (MOD) LV vol d, MOD A2C: 158.0 ml LV vol d, MOD A4C: 138.0 ml LV vol s, MOD A2C: 72.4 ml LV vol s, MOD A4C: 70.1 ml LV SV MOD A2C:     85.6 ml LV SV MOD A4C:     138.0 ml LV SV MOD BP:      75.5 ml  RIGHT VENTRICLE             IVC RV Basal diam:  3.50 cm     IVC diam: 1.50 cm RV S prime:     11.40 cm/s TAPSE (M-mode): 2.6 cm  LEFT ATRIUM             Index        RIGHT ATRIUM            Index LA diam:        4.70 cm 2.19 cm/m   RA Area:     17.80 cm LA Vol (A2C):   95.3 ml 44.41 ml/m  RA Volume:   45.30 ml  21.11  ml/m LA Vol (A4C):   82.6 ml 38.49 ml/m LA Biplane Vol: 91.7 ml 42.73 ml/m  AORTIC VALVE AV Area (Vmax):    2.62 cm AV Area (Vmean):   2.57 cm AV Area (VTI):     2.67 cm AV Vmax:           136.00 cm/s AV Vmean:          99.600 cm/s AV VTI:            0.350 m AV Peak Grad:      7.4 mmHg AV Mean Grad:      4.0 mmHg LVOT Vmax:         103.00 cm/s LVOT Vmean:        73.900 cm/s LVOT VTI:          0.270 m LVOT/AV VTI ratio: 0.77   AORTA Ao Root diam: 3.30 cm Ao Asc diam:  3.50 cm Ao Arch diam: 3.0 cm  MITRAL VALVE                TRICUSPID VALVE MV Area (PHT): 3.63 cm     TR Peak grad:   37.0 mmHg MV Decel Time: 209 msec     TR Vmax:        304.00 cm/s MV E velocity: 99.80 cm/s MV A velocity: 121.00 cm/s  SHUNTS MV E/A ratio:  0.82         Systemic VTI:  0.27 m                             Systemic Diam: 2.10 cm  Kate Sable MD Electronically signed by Kate Sable MD Signature Date/Time: 06/04/2022/5:01:57 PM      Final   Note: Reviewed        Physical Exam  General appearance: Well nourished, well developed, and well hydrated. In no apparent acute distress Mental status: Alert, oriented x 3 (person, place, & time)       Respiratory: No evidence of acute respiratory distress Eyes: PERLA Vitals: BP (!) 137/59   Pulse 70   Temp (!) 97.4 F (36.3 C) (Temporal)   Resp 16   Ht $R'5\' 6"'LC$  (1.676 m)   Wt 230 lb (104.3 kg)   SpO2 100%   BMI 37.12 kg/m  BMI: Estimated body mass index is 37.12 kg/m as calculated from the following:   Height as of this encounter: $RemoveBeforeD'5\' 6"'luaJbLoLjGMdUm$  (1.676 m).   Weight as of this encounter: 230 lb (104.3 kg). Ideal: Ideal body weight: 59.3 kg (130 lb 11.7 oz) Adjusted ideal body weight: 77.3 kg (170 lb 7 oz)  Cervical Spine Area Exam  Skin & Axial Inspection: No masses, redness, edema, swelling, or  associated skin lesions Alignment: Symmetrical Functional ROM: Decreased ROM, to the right Stability: No instability detected Muscle Tone/Strength: Functionally intact. No obvious neuro-muscular anomalies detected. Sensory (Neurological): Dermatomal pain pattern RIGHT C 5,6,7 Palpation: No palpable anomalies             + spurling's on right      Upper Extremity (UE) Exam      Side: Right upper extremity   Side: Left upper extremity   Skin & Extremity Inspection: Skin color, temperature, and hair growth are WNL. No peripheral edema or cyanosis. No masses, redness, swelling, asymmetry, or associated skin lesions. No contractures.   Skin & Extremity Inspection: Skin color, temperature, and hair growth are WNL. No peripheral edema or cyanosis. No  masses, redness, swelling, asymmetry, or associated skin lesions. No contractures.   Functional ROM: Decreased ROM for all joints of upper extremity   Functional ROM: Unrestricted ROM           Muscle Tone/Strength: Functionally intact. No obvious neuro-muscular anomalies detected.   Muscle Tone/Strength: Functionally intact. No obvious neuro-muscular anomalies detected.   Sensory (Neurological): Dermatomal pain pattern affecting the shoulder and elbow   Sensory (Neurological): Unimpaired           Palpation: No palpable anomalies               Palpation: No palpable anomalies               Provocative Test(s):  Phalen's test: deferred Tinel's test: deferred Apley's scratch test (touch opposite shoulder):  Action 1 (Across chest): Decreased ROM Action 2 (Overhead): Decreased ROM Action 3 (LB reach): Decreased ROM     Provocative Test(s):  Phalen's test: deferred Tinel's test: deferred Apley's scratch test (touch opposite shoulder):  Action 1 (Across chest): deferred Action 2 (Overhead): deferred Action 3 (LB reach): deferred     4- out of 5 strength RIGHTupper extremity: Shoulder abduction, elbow flexion, elbow extension, thumb  extension. Thoracic Spine Area Exam  Skin & Axial Inspection: No masses, redness, or swelling Alignment: Symmetrical Functional ROM: Unrestricted ROM Stability: No instability detected Muscle Tone/Strength: Functionally intact. No obvious neuro-muscular anomalies detected. Sensory (Neurological): Unimpaired Muscle strength & Tone: No palpable anomalies  Assessment   Diagnosis Status  1. Diabetic polyneuropathy associated with type 2 diabetes mellitus ([place])   2. Chronic pain syndrome   3. Cervical radiculopathy    Controlled Having a Flare-up Having a Flare-up   Updated Problems: No problems updated.  Plan of Care    [name] has a current medication list which includes the following long-term medication(s): orencia, diltiazem, insulin glargine, levocetirizine, novolog, omeprazole, promethazine, torsemide, ezetimibe, leflunomide, [START ON 07/26/2022] oxycodone, [START ON 08/25/2022] oxycodone, and [START ON 09/24/2022] oxycodone.   1. Diabetic polyneuropathy associated with type 2 diabetes mellitus (HCC) - oxyCODONE (OXY IR/ROXICODONE) 5 MG immediate release tablet; Take 1 tablet (5 mg total) by mouth every 8 (eight) hours as needed for severe pain. Must last 30 days.  Dispense: 75 tablet; Refill: 0 - oxyCODONE (OXY IR/ROXICODONE) 5 MG immediate release tablet; Take 1 tablet (5 mg total) by mouth every 8 (eight) hours as needed for severe pain. Must last 30 days.  Dispense: 75 tablet; Refill: 0 - oxyCODONE (OXY IR/ROXICODONE) 5 MG immediate release tablet; Take 1 tablet (5 mg total) by mouth every 8 (eight) hours as needed for severe pain. Must last 30 days.  Dispense: 75 tablet; Refill: 0  2. Chronic pain syndrome - oxyCODONE (OXY IR/ROXICODONE) 5 MG immediate release tablet; Take 1 tablet (5 mg total) by mouth every 8 (eight) hours as needed for severe pain. Must last 30 days.  Dispense: 75 tablet; Refill: 0 - oxyCODONE (OXY IR/ROXICODONE) 5 MG immediate release tablet; Take  1 tablet (5 mg total) by mouth every 8 (eight) hours as needed for severe pain. Must last 30 days.  Dispense: 75 tablet; Refill: 0 - oxyCODONE (OXY IR/ROXICODONE) 5 MG immediate release tablet; Take 1 tablet (5 mg total) by mouth every 8 (eight) hours as needed for severe pain. Must last 30 days.  Dispense: 75 tablet; Refill: 0  3. Cervical radiculopathy - oxyCODONE (OXY IR/ROXICODONE) 5 MG immediate release tablet; Take 1 tablet (5 mg total) by mouth every 8 (eight)  hours as needed for severe pain. Must last 30 days.  Dispense: 75 tablet; Refill: 0 - oxyCODONE (OXY IR/ROXICODONE) 5 MG immediate release tablet; Take 1 tablet (5 mg total) by mouth every 8 (eight) hours as needed for severe pain. Must last 30 days.  Dispense: 75 tablet; Refill: 0 - oxyCODONE (OXY IR/ROXICODONE) 5 MG immediate release tablet; Take 1 tablet (5 mg total) by mouth every 8 (eight) hours as needed for severe pain. Must last 30 days.  Dispense: 75 tablet; Refill: 0   -Reviewed patient's cervical MRI with her again in great detail from 03/10/2022.  Could consider a repeat cervical epidural injection without steroid, local anesthetic and saline only. -Consider cervical spine brace during dialysis. -Follow-up with Dr. Saintclair Halsted to review cervical MRI and discuss if surgery will be beneficial. -Continue with TENS unit to cervical spine   Pharmacotherapy (Medications Ordered): Meds ordered this encounter  Medications   oxyCODONE (OXY IR/ROXICODONE) 5 MG immediate release tablet    Sig: Take 1 tablet (5 mg total) by mouth every 8 (eight) hours as needed for severe pain. Must last 30 days.    Dispense:  75 tablet    Refill:  0    Chronic Pain. (STOP Act - Not applicable). Fill one day early if closed on scheduled refill date.   oxyCODONE (OXY IR/ROXICODONE) 5 MG immediate release tablet    Sig: Take 1 tablet (5 mg total) by mouth every 8 (eight) hours as needed for severe pain. Must last 30 days.    Dispense:  75 tablet     Refill:  0    Chronic Pain. (STOP Act - Not applicable). Fill one day early if closed on scheduled refill date.   oxyCODONE (OXY IR/ROXICODONE) 5 MG immediate release tablet    Sig: Take 1 tablet (5 mg total) by mouth every 8 (eight) hours as needed for severe pain. Must last 30 days.    Dispense:  75 tablet    Refill:  0    Chronic Pain. (STOP Act - Not applicable). Fill one day early if closed on scheduled refill date.   Orders:  No orders of the defined types were placed in this encounter.  Follow-up plan:   Return in about 3 months (around 09/30/2022) for Medication Management, in person.     Recent Visits Date Type Provider Dept  04/02/22 Office Visit Gillis Santa, MD [facility]  Showing recent visits within past 90 days and meeting all other requirements Today's Visits Date Type Provider Dept  06/30/22 Office Visit Gillis Santa, MD [facility]  Showing today's visits and meeting all other requirements Future Appointments Date Type Provider Dept  09/22/22 Appointment Gillis Santa, MD [facility]  Showing future appointments within next 90 days and meeting all other requirements  I discussed the assessment and treatment plan with the patient. The patient was provided an opportunity to ask questions and all were answered. The patient agreed with the plan and demonstrated an understanding of the instructions.  Patient advised to call back or seek an in-person evaluation if the symptoms or condition worsens.  Duration of encounter: 63minutes.  Note by: Gillis Santa, MD Date: 06/30/2022; Time: 10:29 AM

## 2022-06-30 NOTE — Progress Notes (Signed)
Office Visit    Patient Name: [name] Date of Encounter: 06/30/2022  Primary Care Provider:  Venita Lick, NP Primary Cardiologist:  Kathlyn Sacramento, MD  Chief Complaint    56 year old female with a history of end-stage renal disease on hemodialysis, COPD, prior tobacco use, diabetes mellitus (diagnosed 1989), hypertension, migraines, hepatitis C, hyperlipidemia, peripheral vascular disease, anxiety, depression, who presents for follow-up related to hypertension.  Past Medical History    Past Medical History:  Diagnosis Date   Allergy    Anemia    Anxiety    worse when not at home - bowel incontinence   Arthritis    Ataxia    Chest pain    a. 10/2016 MV: EF 57%, no ischemia/infarct; b. 04/2022 MV: EF 43%, no ischemia/infarct. Heavy 3 vessel cor Ca2+ and mod aortic atherosclerosis.   COPD (chronic obstructive pulmonary disease) ([place])    Depression    Diabetes mellitus with complication (HCC)    Diastolic dysfunction    a. 06/2016 Echo: EF 55-60%; b. 05/2017 Echo: EF 50-55%; c. 05/2018 Echo: EF 55-60%, no rwma, GrI DD, mild AI, mild-mod MR, mod dil LA. PASP 67mHg; d. 05/2022 Echo: EF 60-65%, no rwma, mild LVH, GrI DD, nl RV fxn, mild-mod dil LA, mild-mod MR, mild AI.   ESRD on hemodialysis ([facility]    MWF dialysis   Fistula    left upper arm   Hepatitis 2003   Hep C   Hiatal hernia    Hypercholesterolemia    Hypertension    Migraine    Mitral regurgitation    a. 05/2022 Echo: mild-mod MR.   Neuropathy, diabetic ([place]    lower legs   [place]    Palpitations    a. 06/2018 Zio: RSR, 74, rare PACs and PVCs.  No significant arrhythmia.   Peripheral vascular disease ([place]    Pneumonia 2015   Psoriasis    Tobacco dependence    Wears dentures    full upper, partial lower   Past Surgical History:  Procedure Laterality Date   A/V FISTULAGRAM Left 04/23/2020   Procedure: A/V FISTULAGRAM;  Surgeon: SKatha Cabal MD;  [place] [facility];  Service: Cardiovascular;  Laterality: Left;   A/V SHUNT INTERVENTION N/A 12/16/2017   Procedure: A/V SHUNT INTERVENTION;  Surgeon: DAlgernon Huxley MD;  [place] [facility];  Service: Cardiovascular;  Laterality: N/A;   AQUEOUS SHUNT Left 12/20/2019   Procedure: AHMED TUBE SHUNT WITH TUTOPLAST AND AC WASHOUT LEFT DIABETIC;  Surgeon: BLeandrew Koyanagi MD;  [place]  Service: Ophthalmology;  Laterality: Left;  Diabetic - insulin   AV FISTULA PLACEMENT Left 11/2014   CESAREAN SECTION     CHOLECYSTECTOMY     COLONOSCOPY N/A 09/22/2017   Procedure: COLONOSCOPY;  Surgeon: VLin Landsman MD;  [place]  Service: Endoscopy;  Laterality: N/A;   COLONOSCOPY WITH PROPOFOL N/A 04/16/2020   Procedure: COLONOSCOPY WITH PROPOFOL;  Surgeon: WLucilla Lame MD;  [facility];  Service: Endoscopy;  Laterality: N/A;  Priority 4   cyst removed  from left hand Left 1989   DIALYSIS/PERMA CATHETER INSERTION N/A 05/20/2017   Procedure: Dialysis/Perma Catheter Insertion and fistulagram/LUE angiogram;  Surgeon: DAlgernon Huxley MD;  [place] [facility];  Service: Cardiovascular;  Laterality: N/A;   ESOPHAGOGASTRODUODENOSCOPY N/A 09/22/2017   Procedure: ESOPHAGOGASTRODUODENOSCOPY (EGD);  Surgeon: VLin Landsman MD;  [place]  Service: Endoscopy;  Laterality:  N/A;   ESOPHAGOGASTRODUODENOSCOPY (EGD) WITH PROPOFOL N/A 12/04/2021   Procedure: ESOPHAGOGASTRODUODENOSCOPY (EGD) WITH PROPOFOL;  Surgeon: [name], MD;  Location[facility];  Service: Endoscopy;  Laterality: N/A;   INCISION AND DRAINAGE ABSCESS N/A 07/16/2016   Procedure: INCISION AND DRAINAGE ABSCESS;  Surgeon: Carloyn Manner, MD;  Location: [facility];  Service: ENT;  Laterality: N/A;   PERIPHERAL VASCULAR CATHETERIZATION N/A 07/25/2015   Procedure: A/V Shuntogram/Fistulagram;  Surgeon: Algernon Huxley, MD;  Location: [place] [facility];   Service: Cardiovascular;  Laterality: N/A;   PERIPHERAL VASCULAR CATHETERIZATION Left 07/25/2015   Procedure: A/V Shunt Intervention;  Surgeon: Algernon Huxley, MD;  Location: [place] [facility];  Service: Cardiovascular;  Laterality: Left;   PERIPHERAL VASCULAR CATHETERIZATION Left 10/07/2015   Procedure: A/V Shuntogram/Fistulagram;  Surgeon: Algernon Huxley, MD;  Location: [place] [facility];  Service: Cardiovascular;  Laterality: Left;   PERIPHERAL VASCULAR CATHETERIZATION N/A 10/07/2015   Procedure: A/V Shunt Intervention;  Surgeon: Algernon Huxley, MD;  Location: [place] [facility];  Service: Cardiovascular;  Laterality: N/A;   PERIPHERAL VASCULAR CATHETERIZATION  10/07/2015   Procedure: Dialysis/Perma Catheter Insertion;  Surgeon: Algernon Huxley, MD;  Location: [place] [facility];  Service: Cardiovascular;;   PERIPHERAL VASCULAR CATHETERIZATION N/A 12/17/2015   Procedure: Dialysis/Perma Catheter Removal;  Surgeon: Katha Cabal, MD;  Location: [place] [facility];  Service: Cardiovascular;  Laterality: N/A;   PERIPHERAL VASCULAR CATHETERIZATION N/A 01/11/2017   Procedure: Visceral Angiography;  Surgeon: Algernon Huxley, MD;  Location: [place] [facility];  Service: Cardiovascular;  Laterality: N/A;   PERIPHERAL VASCULAR CATHETERIZATION N/A 01/11/2017   Procedure: Visceral Artery Intervention;  Surgeon: Algernon Huxley, MD;  Location: [place] [facility];  Service: Cardiovascular;  Laterality: N/A;   POLYPECTOMY  09/22/2017   Procedure: POLYPECTOMY;  Surgeon: Lin Landsman, MD;  Location: [place];  Service: Endoscopy;;   rt. tubal and ovary removed     STENT PLACEMENT VASCULAR ([place] HX) Left 03/2018   Performed at [facility] using EV3 protg GPS stent graph [id number] lot [id number]   TONSILLECTOMY     UPPER EXTREMITY ANGIOGRAPHY Left 09/29/2019   Procedure: UPPER EXTREMITY ANGIOGRAPHY;  Surgeon: Katha Cabal, MD;  Location: [place] [facility];   Service: Cardiovascular;  Laterality: Left;    Allergies  Allergies  Allergen Reactions   Cinnamon Anaphylaxis   Garlic Anaphylaxis and Hives   Onion Hives and Swelling   Tylenol [Acetaminophen] Anaphylaxis   Losartan Potassium     Other reaction(s): Other (See Comments)   Amlodipine    Ciprofloxacin Diarrhea   Clonidine Derivatives     Dry mouth; somnolence.   Ginger    Hydralazine     Excessive thirst    Lisinopril    Metoprolol     GI upset   Prednisone Other (See Comments)    Vaginal blisters & oral blisters    Statins     Myalgias     History of Present Illness    56 year old female with the above complex past medical history including end-stage renal disease on hemodialysis, COPD, tobacco abuse, diabetes, hypertension, migraines, hepatitis C, hyperlipidemia, peripheral vascular is, anxiety, depression.  She was previously evaluated in 2017 for atypical chest pain, and underwent stress echocardiogram, which showed normal LV function.  She was later referred to Dr. Fletcher Anon in June 2019 with dizziness, presyncope, and palpitations.  Event monitoring was undertaken and showed rare PACs and PVCs with no significant  arrhythmias.  Follow-up echo in June 2019 showed normal LV function with grade 1 diastolic dysfunction.  She reestablished care in our office in April 2023, in the setting of difficult to manage hypertension.  She also reported intermittent rest and exertional chest discomfort associated with dyspnea.  She was placed on carvedilol therapy and subsequently underwent stress testing which showed no evidence of ischemia or infarct though CT attenuation correction images showed heavy three-vessel coronary calcification and moderate aortic atherosclerosis.  EF was calculated at 43%, and this was followed by an echocardiogram which showed an EF of 60 to 65% with mild LVH, grade 1 diastolic dysfunction, mild to moderate MR, and mild AI.  In the setting of coronary calcification,  initiation of statin therapy was recommended however, patient reported prior intolerance and therefore, Zetia 10 mg daily was started.  Today, Ms. Heider reports that about 2 weeks ago, she stopped taking carvedilol because she was experiencing intermittent lightheadedness, which she associated with low blood pressures.  She notes that on several occasions, she felt like she might lose consciousness.  Since then, she has continued to take diltiazem, which is prescribed to be taken 4 times per day however, she notes that on dialysis days she usually takes it twice and on nondialysis days, she is taking it about 3 times.  She notes that on dialysis mornings, she does not routinely take diltiazem and pressures are high but these seem to improve after she comes home and takes her evening doses.  She denies chest pain but does continue to experience stable dyspnea on exertion.  She denies palpitations, PND, orthopnea, syncope, or early satiety.  She sometimes notes mild lower extremity swelling.  Home Medications    Current Outpatient Medications  Medication Sig Dispense Refill   Abatacept (ORENCIA) 125 MG/ML SOSY Inject into the skin.     brimonidine (ALPHAGAN) 0.2 % ophthalmic solution Place 1 drop into the left eye in the morning and at bedtime.      diltiazem (CARDIZEM) 90 MG tablet Take 1 tablet (90 mg total) by mouth 4 (four) times daily. **Taking twice daily on dialysis days and 3 times daily on nondialysis days 120 tablet 11   dorzolamidel-timolol (COSOPT) 22.3-6.8 MG/ML SOLN ophthalmic solution Place 1 drop into the left eye 2 (two) times daily.      glucose blood test strip Use to check blood sugar 3 times daily, fasting in morning with goal <130 and 2 hours after meals with goal <180.  Bring blood sugar log to visits. 100 each 11   heparin 1000 unit/ml SOLN injection Inject into the peritoneum as needed. Three times per week before dialysis     Incontinence Supply Disposable (FQ PROTECTIVE  UNDERWEAR) MISC 1 CSE PU LARGE PLUS USING DAILY AS NEEDED ADD WIPES 72 each 11   insulin glargine (LANTUS) 100 UNIT/ML injection INJECT 10 UNITS SUBCUTANEOUSLY ONCE A DAY 10 mL 5   INSULIN SYRINGE .5CC/29G (TRUEPLUS INSULIN SYRINGE) 29G X 1/2" 0.5 ML MISC USE 4 TIMES A DAY WITH LANTUS & NOVOLOG 100 each 4   latanoprost (XALATAN) 0.005 % ophthalmic solution Place 1 drop into the left eye daily.     leflunomide (ARAVA) 10 MG tablet Take 1 tablet by mouth daily.     levocetirizine (XYZAL) 5 MG tablet Take 1 tablet (5 mg total) by mouth every evening. 90 tablet 3   midodrine (PROAMATINE) 10 MG tablet Take 0.5-1 tablets (5-10 mg total) by mouth daily as needed. 90 tablet 1  NOVOLOG 100 UNIT/ML injection Inject 5-10 Units into the skin 3 (three) times daily before meals. 10 mL 10   omeprazole (PRILOSEC) 40 MG capsule Take 1 capsule (40 mg total) by mouth daily. 90 capsule 3   [START ON 07/26/2022] oxyCODONE (OXY IR/ROXICODONE) 5 MG immediate release tablet Take 1 tablet (5 mg total) by mouth every 8 (eight) hours as needed for severe pain. Must last 30 days. 75 tablet 0   [START ON 08/25/2022] oxyCODONE (OXY IR/ROXICODONE) 5 MG immediate release tablet Take 1 tablet (5 mg total) by mouth every 8 (eight) hours as needed for severe pain. Must last 30 days. 75 tablet 0   [START ON 09/24/2022] oxyCODONE (OXY IR/ROXICODONE) 5 MG immediate release tablet Take 1 tablet (5 mg total) by mouth every 8 (eight) hours as needed for severe pain. Must last 30 days. 75 tablet 0   promethazine (PHENERGAN) 25 MG tablet Take 1 tablet (25 mg total) by mouth 2 (two) times daily as needed. 90 tablet 4   torsemide (DEMADEX) 100 MG tablet Take 1 tablet (100 mg total) by mouth daily. 90 tablet 4   Vitamin D, Ergocalciferol, (DRISDOL) 1.25 MG (50000 UNIT) CAPS capsule Take 50,000 Units by mouth once a week.     busPIRone (BUSPAR) 5 MG tablet Take 2 tablets (10 mg total) by mouth 2 (two) times daily as needed. (Patient not taking:  Reported on 06/30/2022) 180 tablet 4   ezetimibe (ZETIA) 10 MG tablet Take 1 tablet (10 mg total) by mouth daily. (Patient not taking: Reported on 06/30/2022) 90 tablet 3   No current facility-administered medications for this visit.     Review of Systems    Chronic dyspnea on exertion and occasional orthostatic lightheadedness and ankle edema.  She denies palpitations, PND, orthopnea, syncope, edema, or early satiety.  All other systems reviewed and are otherwise negative except as noted above.    Physical Exam    VS:  BP 138/68 (BP Location: Right Arm, Patient Position: Sitting, Cuff Size: Large)   Pulse 66   Ht '5\' 6"'$  (1.676 m)   Wt 232 lb 12.8 oz (105.6 kg)   SpO2 99%   BMI 37.57 kg/m  , BMI Body mass index is 37.57 kg/m.     GEN: Well nourished, well developed, in no acute distress. HEENT: normal. Neck: Supple, no JVD, carotid bruits, or masses. Cardiac: RRR, 2/6 systolic murmur heard at the upper sternal borders, no rubs or gallops. No clubbing, cyanosis, trace bilateral ankle edema.  Radials/PT 2+ and equal bilaterally.  Respiratory:  Respirations regular and unlabored, clear to auscultation bilaterally. GI: Soft, nontender, nondistended, BS + x 4. MS: no deformity or atrophy. Skin: warm and dry, no rash. Neuro:  Strength and sensation are intact. Psych: Normal affect.  Accessory Clinical Findings     Lab Results  Component Value Date   WBC 7.5 07/17/2020   HGB 11.9 (A) 07/17/2020   HCT 38 07/17/2020   MCV 91.4 05/01/2019   PLT 179 07/17/2020   Lab Results  Component Value Date   CREATININE 3.76 (H) 03/18/2022   BUN 24 03/18/2022   NA 138 03/18/2022   K 4.3 03/18/2022   CL 99 03/18/2022   CO2 23 03/18/2022   Lab Results  Component Value Date   ALT 16 03/18/2022   AST 20 03/18/2022   ALKPHOS 134 (H) 03/18/2022   BILITOT 0.4 03/18/2022   Lab Results  Component Value Date   CHOL 175 03/18/2022   HDL 39 (  L) 03/18/2022   LDLCALC 109 (H) 03/18/2022    TRIG 153 (H) 03/18/2022    Lab Results  Component Value Date   HGBA1C 7.5 (H) 03/18/2022    Assessment & Plan    1.  Essential hypertension: At last visit, patient reported frequent elevations in blood pressure despite oral diltiazem therapy.  She also has multiple medication intolerances.  Low-dose carvedilol was added, which significantly improved her hypertension but did result in more frequent bottoming out, and thus she discontinued this 2 weeks ago.  Pressure today is stable at 138/68.  She is currently taking diltiazem twice a day on dialysis days and 3 times a day on nondialysis days.  It was previously prescribed as 4 times per day.  For future consideration, if compliance with multidose diltiazem suffers, it may be easier to simplify her regimen to a twice daily dose of carvedilol but given current stability and long-term tolerance of diltiazem, we will make no changes today.  2.  Precordial chest pain/coronary calcifications: At previous visit, she reported frequent rest and exertional chest tightness.  Stress testing was undertaken and was low risk without ischemia or infarct though heavy three-vessel coronary calcification and moderate aortic atherosclerosis was noted.  She denies chest pain today and has chronic dyspnea on exertion.  She is intolerant to statins and I did recommend that she start taking Zetia after seeing nuclear study results however, she has yet to start this.  She will plan to start this today.  If tolerated, she should also be taking low-dose aspirin.  3.  Hyperlipidemia: LDL of 109 in March with coronary calcifications noted on recent imaging.  She reports statin intolerance but is now willing to start Zetia 10 mg daily.  4.  End-stage renal disease: On Monday, Wednesday, Friday dialysis.  Generally tolerates this well as long as she does not take blood pressure medicine in the morning.  5.  Mild to moderate mitral regurgitation: Noted on echocardiogram.   Follow-up echo in approximately 2 years.  6.  Type 2 diabetes mellitus: A1c was 7.5 in March.  She is closely followed by primary care.  7.  Chronic heart failure with preserved ejection fraction: Volume management per nephrology/hemodialysis.  Relatively euvolemic on examination today with only trace ankle edema.  8.  COPD: She quit smoking last year.  No active wheezing.  9.  Disposition: Follow-up in 6 to 8 weeks to reassess blood pressure.  Murray Hodgkins, NP 06/30/2022, 10:12 AM

## 2022-06-30 NOTE — Telephone Encounter (Signed)
Patient in to see provider today. Closing encounter.

## 2022-06-30 NOTE — Progress Notes (Signed)
Nursing Pain Medication Assessment:  Safety precautions to be maintained throughout the outpatient stay will include: orient to surroundings, keep bed in low position, maintain call bell within reach at all times, provide assistance with transfer out of bed and ambulation.  Medication Inspection Compliance: Pill count conducted under aseptic conditions, in front of the patient. Neither the pills nor the bottle was removed from the patient's sight at any time. Once count was completed pills were immediately returned to the patient in their original bottle.  Medication: Oxycodone IR Pill/Patch Count:  67 of 75 pills remain Pill/Patch Appearance: Markings consistent with prescribed medication Bottle Appearance: Standard pharmacy container. Clearly labeled. Filled Date: 07 / 06 / 2023 Last Medication intake:  Yesterday

## 2022-07-09 LAB — HM DIABETES EYE EXAM

## 2022-07-27 ENCOUNTER — Emergency Department
Admission: EM | Admit: 2022-07-27 | Discharge: 2022-07-27 | Discharge status: Against Medical Advice | Payer: 59 | Attending: Emergency Medicine | Admitting: Emergency Medicine

## 2022-07-27 DIAGNOSIS — M25562 Pain in left knee: Secondary | ICD-10-CM | POA: Insufficient documentation

## 2022-07-27 DIAGNOSIS — Z5321 Procedure and treatment not carried out due to patient leaving prior to being seen by health care provider: Secondary | ICD-10-CM | POA: Diagnosis not present

## 2022-07-27 DIAGNOSIS — Z992 Dependence on renal dialysis: Secondary | ICD-10-CM | POA: Diagnosis not present

## 2022-07-27 DIAGNOSIS — M25512 Pain in left shoulder: Secondary | ICD-10-CM | POA: Diagnosis present

## 2022-07-27 NOTE — ED Notes (Signed)
Patients husband came to ED and informed RN that patient does not want to be seen anymore. Patient wheeled out by husband.

## 2022-07-27 NOTE — ED Triage Notes (Signed)
First nurse note: patient brought in by [facility] from dialysis c/o left knee and left shoulder pain. Patient uses cane at baseline and is visually impaired.

## 2022-07-28 LAB — HM DIABETES EYE EXAM

## 2022-07-29 ENCOUNTER — Telehealth: Payer: Self-pay | Admitting: Nurse Practitioner

## 2022-07-29 NOTE — Telephone Encounter (Signed)
Copied from [place] [id number]. Topic: Medicare AWV >> Jul 29, 2022  1:05 PM Josephina Gip wrote: Reason for CRM: N/A unable to leave a message for patient to call back and schedule the Medicare Annual Wellness Visit (AWV) virtually or by telephone.  Last AWV 01/23/20  Please schedule at anytime with [facility]-Nurse Health Advisor.  Any questions, please call me at [phone]

## 2022-08-20 ENCOUNTER — Ambulatory Visit: Payer: 59 | Admitting: Nurse Practitioner

## 2022-09-02 HISTORY — PX: EYE SURGERY: SHX253

## 2022-09-06 NOTE — Patient Instructions (Signed)

## 2022-09-10 ENCOUNTER — Ambulatory Visit (INDEPENDENT_AMBULATORY_CARE_PROVIDER_SITE_OTHER): Payer: 59 | Admitting: Nurse Practitioner

## 2022-09-10 ENCOUNTER — Ambulatory Visit (INDEPENDENT_AMBULATORY_CARE_PROVIDER_SITE_OTHER): Payer: 59

## 2022-09-10 ENCOUNTER — Encounter: Payer: Self-pay | Admitting: Nurse Practitioner

## 2022-09-10 VITALS — BP 140/78 | HR 79 | Temp 98.0°F | Ht 66.0 in | Wt 232.6 lb

## 2022-09-10 DIAGNOSIS — M0579 Rheumatoid arthritis with rheumatoid factor of multiple sites without organ or systems involvement: Secondary | ICD-10-CM

## 2022-09-10 DIAGNOSIS — Z0000 Encounter for general adult medical examination without abnormal findings: Secondary | ICD-10-CM | POA: Diagnosis not present

## 2022-09-10 DIAGNOSIS — N186 End stage renal disease: Secondary | ICD-10-CM

## 2022-09-10 DIAGNOSIS — E1142 Type 2 diabetes mellitus with diabetic polyneuropathy: Secondary | ICD-10-CM | POA: Diagnosis not present

## 2022-09-10 DIAGNOSIS — Z992 Dependence on renal dialysis: Secondary | ICD-10-CM

## 2022-09-10 DIAGNOSIS — G894 Chronic pain syndrome: Secondary | ICD-10-CM

## 2022-09-10 DIAGNOSIS — E1159 Type 2 diabetes mellitus with other circulatory complications: Secondary | ICD-10-CM | POA: Diagnosis not present

## 2022-09-10 DIAGNOSIS — E1122 Type 2 diabetes mellitus with diabetic chronic kidney disease: Secondary | ICD-10-CM | POA: Diagnosis not present

## 2022-09-10 DIAGNOSIS — B182 Chronic viral hepatitis C: Secondary | ICD-10-CM

## 2022-09-10 DIAGNOSIS — E559 Vitamin D deficiency, unspecified: Secondary | ICD-10-CM

## 2022-09-10 DIAGNOSIS — I739 Peripheral vascular disease, unspecified: Secondary | ICD-10-CM

## 2022-09-10 DIAGNOSIS — F411 Generalized anxiety disorder: Secondary | ICD-10-CM

## 2022-09-10 DIAGNOSIS — E113593 Type 2 diabetes mellitus with proliferative diabetic retinopathy without macular edema, bilateral: Secondary | ICD-10-CM | POA: Diagnosis not present

## 2022-09-10 DIAGNOSIS — F3341 Major depressive disorder, recurrent, in partial remission: Secondary | ICD-10-CM

## 2022-09-10 DIAGNOSIS — F17219 Nicotine dependence, cigarettes, with unspecified nicotine-induced disorders: Secondary | ICD-10-CM

## 2022-09-10 DIAGNOSIS — I152 Hypertension secondary to endocrine disorders: Secondary | ICD-10-CM

## 2022-09-10 DIAGNOSIS — L4050 Arthropathic psoriasis, unspecified: Secondary | ICD-10-CM

## 2022-09-10 DIAGNOSIS — E11621 Type 2 diabetes mellitus with foot ulcer: Secondary | ICD-10-CM

## 2022-09-10 LAB — BAYER DCA HB A1C WAIVED: HB A1C (BAYER DCA - WAIVED): 6.7 % — ABNORMAL HIGH (ref 4.8–5.6)

## 2022-09-10 MED ORDER — HYDROXYZINE PAMOATE 25 MG PO CAPS: 25.0000 mg | ORAL_CAPSULE | Freq: Three times a day (TID) | ORAL | 4 refills | Status: DC | PRN

## 2022-09-10 NOTE — Assessment & Plan Note (Signed)
Chronic, ongoing -- followed by GI, continue this collaboration, to return as needed.

## 2022-09-10 NOTE — Patient Instructions (Signed)
Diabetes Mellitus and Foot Care Foot care is an important part of your health, especially when you have diabetes. Diabetes may cause you to have problems because of poor blood flow (circulation) to your feet and legs, which can cause your skin to: Become thinner and drier. Break more easily. Heal more slowly. Peel and crack. You may also have nerve damage (neuropathy) in your legs and feet, causing decreased feeling in them. This means that you may not notice minor injuries to your feet that could lead to more serious problems. Noticing and addressing any potential problems early is the best way to prevent future foot problems. How to care for your feet Foot hygiene  Wash your feet daily with warm water and mild soap. Do not use hot water. Then, pat your feet and the areas between your toes until they are completely dry. Do not soak your feet as this can dry your skin. Trim your toenails straight across. Do not dig under them or around the cuticle. File the edges of your nails with an emery board or nail file. Apply a moisturizing lotion or petroleum jelly to the skin on your feet and to dry, brittle toenails. Use lotion that does not contain alcohol and is unscented. Do not apply lotion between your toes. Shoes and socks Wear clean socks or stockings every day. Make sure they are not too tight. Do not wear knee-high stockings since they may decrease blood flow to your legs. Wear shoes that fit properly and have enough cushioning. Always look in your shoes before you put them on to be sure there are no objects inside. To break in new shoes, wear them for just a few hours a day. This prevents injuries on your feet. Wounds, scrapes, corns, and calluses  Check your feet daily for blisters, cuts, bruises, sores, and redness. If you cannot see the bottom of your feet, use a mirror or ask someone for help. Do not cut corns or calluses or try to remove them with medicine. If you find a minor scrape,  cut, or break in the skin on your feet, keep it and the skin around it clean and dry. You may clean these areas with mild soap and water. Do not clean the area with peroxide, alcohol, or iodine. If you have a wound, scrape, corn, or callus on your foot, look at it several times a day to make sure it is healing and not infected. Check for: Redness, swelling, or pain. Fluid or blood. Warmth. Pus or a bad smell. General tips Do not cross your legs. This may decrease blood flow to your feet. Do not use heating pads or hot water bottles on your feet. They may burn your skin. If you have lost feeling in your feet or legs, you may not know this is happening until it is too late. Protect your feet from hot and cold by wearing shoes, such as at the beach or on hot pavement. Schedule a complete foot exam at least once a year (annually) or more often if you have foot problems. Report any cuts, sores, or bruises to your health care provider immediately. Where to find more information American Diabetes Association: www.diabetes.org Association of Diabetes [employer] & Education Specialists: www.diabeteseducator.org Contact a health care provider if: You have a medical condition that increases your risk of infection and you have any cuts, sores, or bruises on your feet. You have an injury that is not healing. You have redness on your legs or feet. You   feel burning or tingling in your legs or feet. You have pain or cramps in your legs and feet. Your legs or feet are numb. Your feet always feel cold. You have pain around any toenails. Get help right away if: You have a wound, scrape, corn, or callus on your foot and: You have pain, swelling, or redness that gets worse. You have fluid or blood coming from the wound, scrape, corn, or callus. Your wound, scrape, corn, or callus feels warm to the touch. You have pus or a bad smell coming from the wound, scrape, corn, or callus. You have a fever. You have a red  line going up your leg. Summary Check your feet every day for blisters, cuts, bruises, sores, and redness. Apply a moisturizing lotion or petroleum jelly to the skin on your feet and to dry, brittle toenails. Wear shoes that fit properly and have enough cushioning. If you have foot problems, report any cuts, sores, or bruises to your health care provider immediately. Schedule a complete foot exam at least once a year (annually) or more often if you have foot problems. This information is not intended to replace advice given to you by your health care provider. Make sure you discuss any questions you have with your health care provider. Document Revised: 06/27/2020 Document Reviewed: 06/27/2020 Elsevier Patient Education  [place] for Type 2 Diabetes  A screening test for type 2 diabetes (type 2 diabetes mellitus) is a blood test to measure your blood sugar (glucose) level. This test is done to check for early signs of diabetes, before you develop symptoms.  Type 2 diabetes is a long-term (chronic) disease. In type 2 diabetes, one or both of these problems may be present: The pancreas does not make enough of a hormone called insulin. Cells in the body do not respond properly to insulin that the body makes (insulin resistance). Normally, insulin allows blood sugar (glucose) to enter cells in the body. The cells use glucose for energy. Insulin resistance or lack of insulin causes excess glucose to build up in the blood instead of going into cells. This results in high blood glucose levels (hyperglycemia), which can cause many complications. You may be screened for type 2 diabetes as part of your regular health care, especially if you have a high risk for diabetes. Screening can help to identify type 2 diabetes at its early stage (prediabetes). Identifying and treating prediabetes may delay or prevent the development of type 2 diabetes. Tell a health care provider about: All  medicines you are taking, including vitamins, herbs, eye drops, creams, and over-the-counter medicines. Any bleeding problems you have. Any medical conditions you have. Whether you are pregnant or may be pregnant. Who should be screened for type 2 diabetes? Adults Adults age 15 and older. These adults should be screened once every three years. Adults who are any age, are overweight, and have one other risk factor. These adults should be screened once every three years. Adults who have normal blood glucose levels and two or more risk factors. These adults may be screened once every year (annually). Women who have had gestational diabetes in the past. These women should be screened once every three years. Pregnant women who have risk factors. These women should be screened at their first prenatal visit and again between weeks 24 and 28 of pregnancy. Children and adolescents Children and adolescents should be screened for type 2 diabetes if they are overweight and have any of the following  risk factors: A family history of type 2 diabetes. Being a member of a high-risk ethnic group. Signs of insulin resistance or conditions that are associated with insulin resistance. A mother who had gestational diabetes while pregnant. Screening should be done at least once every three years, starting at age 82 or at the onset of puberty, whichever comes first. Your health care provider or your child's health care provider may recommend having a screening more or less often. What are the risk factors for type 2 diabetes? The following are factors that may make you more likely to develop type 2 diabetes and can be modified: Not getting enough exercise. Having high blood pressure. Having low levels of good cholesterol (HDL-C) or high levels of blood fats (triglycerides). Having high blood glucose in a previous blood test. Being overweight or obese. The following are factors that may make you more likely to  develop type 2 diabetes and can not be modified: Having a parent or sibling (first-degree relative) who has diabetes. Being of American-Indian, African-American, Hispanic/Latino, Asian, or [place] descent. Being older than age 61. Having a history of diabetes during pregnancy (gestational diabetes). Having certain diseases or conditions that may be caused by insulin resistance, including: Acanthosis nigricans. This is a condition that causes dark skin on the neck, armpits, and groin. Polycystic ovary syndrome (PCOS). Cardiovascular heart disease. What happens during screening? During screening, your health care provider may ask questions about: Your health and your risk factors, including your activity level and any medical conditions that you have. The health of your first-degree relatives. Past pregnancies, if this applies. Your health care provider will also do a physical exam, including a blood pressure measurement and blood tests. There are four blood tests that can be used to screen for type 2 diabetes. You may have one or more of the following: A fasting blood glucose (FBG) test. You will not be allowed to eat (you will fast) for 8 hours or more before a blood sample is taken. A random blood glucose test. This test checks your blood glucose at any time of the day regardless of when you ate. An oral glucose tolerance test (OGTT). This test measures your blood glucose at two times: After you have not eaten (have fasted) overnight. This is your baseline glucose level. Two hours after you drink a glucose-containing beverage. An A1C (hemoglobin A1C) blood test. This test provides information about blood glucose control over the previous 2-3 months. What do the results mean? Your test results are a measurement of how much glucose is in your blood. Normal blood glucose levels mean that you do not have diabetes or prediabetes. High blood glucose levels may mean that you have prediabetes  or diabetes. Depending on the results, other tests may be needed to confirm the diagnosis. You may be diagnosed with type 2 diabetes if: Your FBG level is 126 mg/dL (7.0 mmol/L) or higher. Your random blood glucose level is 200 mg/dL (11.1 mmol/L) or higher. Your A1C level is 6.5% or higher. Your OGTT result is higher than 200 mg/dL (11.1 mmol/L). These blood tests may be repeated to confirm your diagnosis. Talk with your health care provider about what your results mean. Summary A screening test for type 2 diabetes (type 2 diabetes mellitus) is a blood test to measure your blood sugar (glucose) level. Know what your risk factors are for developing type 2 diabetes. If you are at risk, get screening tests as often as told by your health care provider.  Screening may help you identify type 2 diabetes at its early stage (prediabetes). Identifying and treating prediabetes may delay or prevent the development of type 2 diabetes. This information is not intended to replace advice given to you by your health care provider. Make sure you discuss any questions you have with your health care provider. Document Revised: 03/03/2021 Document Reviewed: 03/03/2021 Elsevier Patient Education  [place] Maintenance, Female Adopting a healthy lifestyle and getting preventive care are important in promoting health and wellness. Ask your health care provider about: The right schedule for you to have regular tests and exams. Things you can do on your own to prevent diseases and keep yourself healthy. What should I know about diet, weight, and exercise? Eat a healthy diet  Eat a diet that includes plenty of vegetables, fruits, low-fat dairy products, and lean protein. Do not eat a lot of foods that are high in solid fats, added sugars, or sodium. Maintain a healthy weight Body mass index (BMI) is used to identify weight problems. It estimates body fat based on height and weight. Your health care  provider can help determine your BMI and help you achieve or maintain a healthy weight. Get regular exercise Get regular exercise. This is one of the most important things you can do for your health. Most adults should: Exercise for at least 150 minutes each week. The exercise should increase your heart rate and make you sweat (moderate-intensity exercise). Do strengthening exercises at least twice a week. This is in addition to the moderate-intensity exercise. Spend less time sitting. Even light physical activity can be beneficial. Watch cholesterol and blood lipids Have your blood tested for lipids and cholesterol at 58 years of age, then have this test every 5 years. Have your cholesterol levels checked more often if: Your lipid or cholesterol levels are high. You are older than 58 years of age. You are at high risk for heart disease. What should I know about cancer screening? Depending on your health history and family history, you may need to have cancer screening at various ages. This may include screening for: Breast cancer. Cervical cancer. Colorectal cancer. Skin cancer. Lung cancer. What should I know about heart disease, diabetes, and high blood pressure? Blood pressure and heart disease High blood pressure causes heart disease and increases the risk of stroke. This is more likely to develop in people who have high blood pressure readings or are overweight. Have your blood pressure checked: Every 3-5 years if you are 43-54 years of age. Every year if you are 54 years old or older. Diabetes Have regular diabetes screenings. This checks your fasting blood sugar level. Have the screening done: Once every three years after age 67 if you are at a normal weight and have a low risk for diabetes. More often and at a younger age if you are overweight or have a high risk for diabetes. What should I know about preventing infection? Hepatitis B If you have a higher risk for hepatitis B,  you should be screened for this virus. Talk with your health care provider to find out if you are at risk for hepatitis B infection. Hepatitis C Testing is recommended for: Everyone born from [birth year] through 1967. Anyone with known risk factors for hepatitis C. Sexually transmitted infections (STIs) Get screened for STIs, including gonorrhea and chlamydia, if: You are sexually active and are younger than 58 years of age. You are older than 58 years of age and your health  care provider tells you that you are at risk for this type of infection. Your sexual activity has changed since you were last screened, and you are at increased risk for chlamydia or gonorrhea. Ask your health care provider if you are at risk. Ask your health care provider about whether you are at high risk for HIV. Your health care provider may recommend a prescription medicine to help prevent HIV infection. If you choose to take medicine to prevent HIV, you should first get tested for HIV. You should then be tested every 3 months for as long as you are taking the medicine. Pregnancy If you are about to stop having your period (premenopausal) and you may become pregnant, seek counseling before you get pregnant. Take 400 to 800 micrograms (mcg) of folic acid every day if you become pregnant. Ask for birth control (contraception) if you want to prevent pregnancy. Osteoporosis and menopause Osteoporosis is a disease in which the bones lose minerals and strength with aging. This can result in bone fractures. If you are 32 years old or older, or if you are at risk for osteoporosis and fractures, ask your health care provider if you should: Be screened for bone loss. Take a calcium or vitamin D supplement to lower your risk of fractures. Be given hormone replacement therapy (HRT) to treat symptoms of menopause. Follow these instructions at home: Alcohol use Do not drink alcohol if: Your health care provider tells you not to  drink. You are pregnant, may be pregnant, or are planning to become pregnant. If you drink alcohol: Limit how much you have to: 0-1 drink a day. Know how much alcohol is in your drink. In the [place] one drink equals one 12 oz bottle of beer (355 mL), one 5 oz glass of wine (148 mL), or one 1 oz glass of hard liquor (44 mL). Lifestyle Do not use any products that contain nicotine or tobacco. These products include cigarettes, chewing tobacco, and vaping devices, such as e-cigarettes. If you need help quitting, ask your health care provider. Do not use street drugs. Do not share needles. Ask your health care provider for help if you need support or information about quitting drugs. General instructions Schedule regular health, dental, and eye exams. Stay current with your vaccines. Tell your health care provider if: You often feel depressed. You have ever been abused or do not feel safe at home. Summary Adopting a healthy lifestyle and getting preventive care are important in promoting health and wellness. Follow your health care provider's instructions about healthy diet, exercising, and getting tested or screened for diseases. Follow your health care provider's instructions on monitoring your cholesterol and blood pressure. This information is not intended to replace advice given to you by your health care provider. Make sure you discuss any questions you have with your health care provider. Document Revised: 04/28/2021 Document Reviewed: 04/28/2021 Elsevier Patient Education  [place].

## 2022-09-10 NOTE — Progress Notes (Signed)
BP (!) 140/78 (BP Location: Left Arm, Patient Position: Sitting, Cuff Size: Large)   Pulse 79   Temp 98 F (36.7 C) (Oral)   Ht '5\' 6"'$  (1.676 m)   Wt 232 lb 9.6 oz (105.5 kg)   SpO2 98%   BMI 37.54 kg/m    Subjective:    Patient ID: [name], female    DOB: Jul 18, 1966, 56 y.o.   MRN: [id number]  HPI: [name] is a 55 y.o. female  Chief Complaint  Patient presents with   Diabetes   Hyperlipidemia   Hypertension   DIABETES A1c March 7.5% last visit -- taking Lantus and Novolog. She prefers vials vs pens -- that way she can see how much she is getting due to vision. Hypoglycemic episodes:no Polydipsia/polyuria: no Visual disturbance: no Chest pain: no Paresthesias: yes Glucose Monitoring: no             Accucheck frequency: a few times a month             Fasting glucose: 130-140 range             Post prandial:             Evening:             Before meals: Taking Insulin?: yes             Long acting insulin: 10 unit Lantus             Short acting insulin: Novolog 5 units Blood Pressure Monitoring: a few times a week Retinal Examination: Up to Date, every 6 weeks -- glaucoma left eye and retinopathy both eyes -- gets injections both eyes -- [facility]  Foot Exam: Up to Date Pneumovax: Up to Date Influenza: Up to Date Aspirin: no    CHRONIC KIDNEY DISEASE With ESRD followed by nephrology.  Started on dialysis >7 years ago. Elevations in BP since then, but when in dialysis her BP will drop to lower levels.  Goes to dialysis Monday, Wednesday, Friday. CKD status: stable Medications renally dose: yes Previous renal evaluation: yes Pneumovax:  Up to Date Influenza Vaccine:  Up to Date    HYPERTENSION / HYPERLIPIDEMIA Seen by cardiology for initial visit on 06/30/22 - tried taking Zetia, but stopped due to not liking way it made her feel. Continues on Diltiazem and Torsemide + no current statin -- gave her pain.    Tried Hydralazine in past = dry mouth +  got orthostatic with this.  Has taken Clonidine, causes dry mouth.    Has been on ACE/ARB in past, hyperkalemia at baseline -- can not take these.  Diltiazem lowers her heart rate.  She takes Midodrine with her to dialysis due to low BP with dialysis.     Satisfied with current treatment? yes Duration of hypertension: chronic BP monitoring frequency: a few times a week BP range:  BP medication side effects: no Past BP meds: multiple Duration of hyperlipidemia: chronic Past cholesterol medications: Zetia and statins Medication compliance: good compliance Aspirin: no Recent stressors: no Recurrent headaches: no Visual changes: no Palpitations: no Dyspnea: no Chest pain: no Lower extremity edema: no Dizzy/lightheaded: no   RHEUMATOID ARTHRITIS Followed by rheumatology with infusions -- last 06/09/22. Saw neurosurgery on 04/07/22 and MRIs were obtained of cervical and lumbar spine.  Follows with chronic pain clinic, last visit 06/30/22. Duration: chronic Pain: yes Symmetric: yes  8/10 Quality: dull, aching, and throbbing Frequency: constant Context:  fluctuating Aggravating  factors: movement Alleviating factors: Norco Relief with NSAIDs?: No NSAIDs Taken Treatments attempted:  followed by pain management  CIRRHOSIS: She is followed by GI for history of Hep C she went through [facility] procedure for this -- last visit was 02/19/22.  Last EGD was 12/04/21.  She is to return to GI as needed.   ANXIETY/STRESS No current medications for mood- reports Buspar made her feel bad.   Duration:stable Anxious mood: yes Excessive worrying: yes Irritability: yes  Sweating: no Nausea: no Palpitations:no Hyperventilation: no Panic attacks:  yes Agoraphobia: no  Obscessions/compulsions: no Depressed mood: yes    09/10/2022   11:06 AM 04-19-2022   10:14 AM 03/18/2022   10:34 AM 11/11/2021   10:04 AM 08/21/2021    9:30 AM  Depression screen PHQ 2/9  Decreased Interest 0 0 '3 2 2  '$ Down,  Depressed, Hopeless 0 0 '1 2 2  '$ PHQ - 2 Score 0 0 '4 4 4  '$ Altered sleeping '3 2 3 3 2  '$ Tired, decreased energy '1 2 3 3 2  '$ Change in appetite 0 0 '3 2 2  '$ Feeling bad or failure about yourself  0 0 0 3 2  Trouble concentrating 0 1 0  0  Moving slowly or fidgety/restless 0 '2 1 2 3  '$ Suicidal thoughts 0 0 0 0 0  PHQ-9 Score '4 7 14 17 15  '$ Difficult doing work/chores Not difficult at all   Somewhat difficult Very difficult   Anhedonia: no Weight changes: no Insomnia: yes hard to fall asleep  Hypersomnia: no Fatigue/loss of energy: yes Feelings of worthlessness: no Feelings of guilt: yes Impaired concentration/indecisiveness: no Suicidal ideations: no  Crying spells: yes Recent Stressors/Life Changes: yes   Relationship problems: no   Family stress: yes     Financial stress: no    Job stress: no    Recent death/loss: no     2022-04-19   10:15 AM 03/18/2022   10:34 AM 11/11/2021   10:05 AM 08/21/2021    9:30 AM  GAD 7 : Generalized Anxiety Score  Nervous, Anxious, on Edge 2 0 3 3  Control/stop worrying 2 0 3 2  Worry too much - different things 2 0 3 2  Trouble relaxing 2 0 3 3  Restless 2 0 3 3  Easily annoyed or irritable 2 0 3 3  Afraid - awful might happen 1 0 2 3  Total GAD 7 Score 13 0 20 19  Anxiety Difficulty Somewhat difficult  Somewhat difficult Very difficult    Relevant past medical, surgical, family and social history reviewed and updated as indicated. Interim medical history since our last visit reviewed. Allergies and medications reviewed and updated.  Review of Systems  Constitutional:  Negative for activity change, appetite change, diaphoresis, fatigue and fever.  Respiratory:  Negative for cough, chest tightness, shortness of breath and wheezing.   Cardiovascular:  Negative for chest pain, palpitations and leg swelling.  Gastrointestinal: Negative.   Endocrine: Negative for cold intolerance, heat intolerance, polydipsia, polyphagia and polyuria.   Musculoskeletal:  Positive for arthralgias.  Neurological: Negative.   Psychiatric/Behavioral:  Positive for sleep disturbance. Negative for decreased concentration, self-injury and suicidal ideas. The patient is nervous/anxious.     Per HPI unless specifically indicated above     Objective:    BP (!) 140/78 (BP Location: Left Arm, Patient Position: Sitting, Cuff Size: Large)   Pulse 79   Temp 98 F (36.7 C) (Oral)   Ht '5\' 6"'$  (1.676 m)  Wt 232 lb 9.6 oz (105.5 kg)   SpO2 98%   BMI 37.54 kg/m   Wt Readings from Last 3 Encounters:  09/10/22 232 lb 9.6 oz (105.5 kg)  09/10/22 232 lb 9.6 oz (105.5 kg)  06/30/22 232 lb 12.8 oz (105.6 kg)    Physical Exam Vitals and nursing note reviewed.  Constitutional:      General: She is awake. She is not in acute distress.    Appearance: She is well-developed and well-groomed. She is obese. She is not ill-appearing or toxic-appearing.  HENT:     Head: Normocephalic.     Right Ear: Hearing, ear canal and external ear normal.     Left Ear: Hearing, ear canal and external ear normal.  Eyes:     General: Lids are normal.        Right eye: No discharge.        Left eye: No discharge.     Conjunctiva/sclera: Conjunctivae normal.     Pupils: Pupils are equal, round, and reactive to light.  Neck:     Thyroid: No thyromegaly.     Vascular: No carotid bruit.  Cardiovascular:     Rate and Rhythm: Normal rate and regular rhythm.     Heart sounds: Normal heart sounds. No murmur heard.    No gallop.     Arteriovenous access: Left arteriovenous access is present. Pulmonary:     Effort: Pulmonary effort is normal. No accessory muscle usage or respiratory distress.     Breath sounds: Normal breath sounds.  Abdominal:     General: Bowel sounds are normal.     Palpations: Abdomen is soft. There is no hepatomegaly or splenomegaly.  Musculoskeletal:     Cervical back: Normal range of motion and neck supple.     Right lower leg: No edema.      Left lower leg: No edema.  Lymphadenopathy:     Cervical: No cervical adenopathy.  Skin:    General: Skin is warm and dry.  Neurological:     Mental Status: She is alert and oriented to person, place, and time.  Psychiatric:        Attention and Perception: Attention normal.        Mood and Affect: Mood normal.        Speech: Speech normal.        Behavior: Behavior normal. Behavior is cooperative.        Thought Content: Thought content normal.     Results for orders placed or performed in visit on 09/10/22  Bayer DCA Hb A1c Waived  Result Value Ref Range   HB A1C (BAYER DCA - WAIVED) 6.7 (H) 4.8 - 5.6 %      Assessment & Plan:   Problem List Items Addressed This Visit       Cardiovascular and Mediastinum   Hypertension associated with diabetes ([place])    Chronic, ongoing with initial BP elevated -- has not taken BP medication today -- repeat is trending down and previous readings + home better.  She does run low during dialysis -- continue Midodrine as needed in dialysis.  Continue to collaborate with cardiology.  Can not take ACE/ARB due to underlying hyperkalemia.  Continue Diltiazem and Torsemide at current doses.  Has had multiple adverse affects to BP medications.  Focus on DASH diet at home.  Check BP daily and document for provider visits.  Labs: CMP and Mag level.        Relevant Medications  LASIX 80 MG tablet   Other Relevant Orders   Bayer DCA Hb A1c Waived (Completed)   Comprehensive metabolic panel   PAD (peripheral artery disease) (HCC)    Chronic, stable.  Continue to monitor and continue to assess for skin breakdown.        Relevant Medications   LASIX 80 MG tablet     Digestive   Chronic hepatitis C without hepatic coma (HCC)    Chronic, ongoing -- followed by GI, continue this collaboration, to return as needed.      Relevant Orders   Comprehensive metabolic panel   Magnesium     Endocrine   Diabetic polyneuropathy associated with type 2  diabetes mellitus ([place]) - Primary    Refer to diabetes with ESRD plan of care.      Relevant Medications   hydrOXYzine (VISTARIL) 25 MG capsule   Other Relevant Orders   Bayer DCA Hb A1c Waived (Completed)   Comprehensive metabolic panel   Lipid Panel w/o Chol/HDL Ratio   Proliferative diabetic retinopathy associated with type 2 diabetes mellitus ([place])    Followed by [facility] every 6 weeks, continue this collaboration.      Relevant Orders   Bayer DCA Hb A1c Waived (Completed)   Type 2 diabetes mellitus with ESRD (end-stage renal disease) (HCC)    Chronic, ongoing with recent dialysis A1c 6.7% today, downward trend from previous.  At this time will continue Lantus and Novolog as ordered and adjust if needed in future.  Recommend she check blood sugar 3-4 times a day, may benefit from Freestyle in future.  Focus on diabetic diet at home.  Return in 3 months.      Relevant Orders   Bayer DCA Hb A1c Waived (Completed)   Comprehensive metabolic panel   Lipid Panel w/o Chol/HDL Ratio     Nervous and Auditory   Nicotine dependence, cigarettes, w unsp disorders    No smoking since August 2022 -- recommend continued cessation.        Musculoskeletal and Integument   Rheumatoid arthritis involving multiple sites with positive rheumatoid factor (HCC)    Chronic,ongoing.  Followed by rheumatology and pain management, continue these collaborations.  Recent notes reviewed.  Continue medications as prescribed by them.          Genitourinary   ESRD (end stage renal disease) on dialysis (HCC)    Chronic, ongoing.  Continue collaboration with nephrology and current dialysis regimen. Labs today.        Other   Chronic pain syndrome   GAD (generalized anxiety disorder)    Refer to depression plan of care.      Relevant Medications   hydrOXYzine (VISTARIL) 25 MG capsule   Recurrent major depression in partial remission (HCC)    Chronic, ongoing. Denies SI/HI.  She does not want  maintenance medication.  Would avoid benzos due to her opioid use for pain, discussed at length with her.  At this time will stop Buspar and start Vistaril to take as needed before dialysis for anxiety.  She would benefit from a maintenance regimen in future that is renal/liver dosed, but declines today.        Relevant Medications   hydrOXYzine (VISTARIL) 25 MG capsule   Vitamin D deficiency    Chronic, taking supplement on occasion.  Recheck today.      Relevant Orders   VITAMIN D 25 Hydroxy (Vit-D Deficiency, Fractures)     Follow up plan: Return in about 3 months (  around 12/10/2022) for T2DM, HTN/HLD, MOOD, CIRRHOSIS, RA.

## 2022-09-10 NOTE — Assessment & Plan Note (Signed)
Chronic, ongoing.  Continue collaboration with nephrology and current dialysis regimen. Labs today.

## 2022-09-10 NOTE — Assessment & Plan Note (Signed)
Chronic, ongoing with initial BP elevated -- has not taken BP medication today -- repeat is trending down and previous readings + home better.  She does run low during dialysis -- continue Midodrine as needed in dialysis.  Continue to collaborate with cardiology.  Can not take ACE/ARB due to underlying hyperkalemia.  Continue Diltiazem and Torsemide at current doses.  Has had multiple adverse affects to BP medications.  Focus on DASH diet at home.  Check BP daily and document for provider visits.  Labs: CMP and Mag level.

## 2022-09-10 NOTE — Progress Notes (Signed)
Subjective:   [name] is a 56 y.o. female who presents for Medicare Annual (Subsequent) preventive examination.  Review of Systems    Defer to PCP.     Objective:    Today's Vitals   09/10/22 1053  PainSc: 6    There is no height or weight on file to calculate BMI.     01/06/2022    8:18 AM 12/04/2021    6:58 AM 07/08/2021    8:40 AM 04/01/2021    9:39 AM 11/05/2020    1:51 PM 04/16/2020   10:10 AM 01/23/2020    1:55 PM  Advanced Directives  Does Patient Have a Medical Advance Directive? No No No No No No No  Would patient like information on creating a medical advance directive? No - Patient declined No - Patient declined No - Patient declined  No - Patient declined      Current Medications (verified) Outpatient Encounter Medications as of 09/10/2022  Medication Sig   Abatacept (ORENCIA) 125 MG/ML SOSY Inject into the skin.   brimonidine (ALPHAGAN) 0.2 % ophthalmic solution Place 1 drop into the left eye in the morning and at bedtime.    diltiazem (CARDIZEM) 90 MG tablet Take 1 tablet (90 mg total) by mouth 4 (four) times daily.   dorzolamidel-timolol (COSOPT) 22.3-6.8 MG/ML SOLN ophthalmic solution Place 1 drop into the left eye 2 (two) times daily.    glucose blood test strip Use to check blood sugar 3 times daily, fasting in morning with goal <130 and 2 hours after meals with goal <180.  Bring blood sugar log to visits.   Incontinence Supply Disposable (FQ PROTECTIVE UNDERWEAR) MISC 1 CSE PU LARGE PLUS USING DAILY AS NEEDED ADD WIPES   insulin glargine (LANTUS) 100 UNIT/ML injection INJECT 10 UNITS SUBCUTANEOUSLY ONCE A DAY   INSULIN SYRINGE .5CC/29G (TRUEPLUS INSULIN SYRINGE) 29G X 1/2" 0.5 ML MISC USE 4 TIMES A DAY WITH LANTUS & NOVOLOG   LASIX 80 MG tablet Take 80 mg by mouth daily.   latanoprost (XALATAN) 0.005 % ophthalmic solution Place 1 drop into the left eye daily.   leflunomide (ARAVA) 10 MG tablet Take 1 tablet by mouth daily.   levocetirizine (XYZAL) 5 MG  tablet Take 1 tablet (5 mg total) by mouth every evening.   midodrine (PROAMATINE) 10 MG tablet Take 0.5-1 tablets (5-10 mg total) by mouth daily as needed.   NOVOLOG 100 UNIT/ML injection Inject 5-10 Units into the skin 3 (three) times daily before meals.   ofloxacin (OCUFLOX) 0.3 % ophthalmic solution Place 1 drop into the right eye 4 (four) times daily.   omeprazole (PRILOSEC) 40 MG capsule Take 1 capsule (40 mg total) by mouth daily.   oxyCODONE (OXY IR/ROXICODONE) 5 MG immediate release tablet Take 1 tablet (5 mg total) by mouth every 8 (eight) hours as needed for severe pain. Must last 30 days.   [START ON 09/24/2022] oxyCODONE (OXY IR/ROXICODONE) 5 MG immediate release tablet Take 1 tablet (5 mg total) by mouth every 8 (eight) hours as needed for severe pain. Must last 30 days.   prednisoLONE acetate (PRED FORTE) 1 % ophthalmic suspension Place 1 drop into the right eye 4 (four) times daily.   promethazine (PHENERGAN) 25 MG tablet Take 1 tablet (25 mg total) by mouth 2 (two) times daily as needed.   Vitamin D, Ergocalciferol, (DRISDOL) 1.25 MG (50000 UNIT) CAPS capsule Take 50,000 Units by mouth once a week.   [DISCONTINUED] busPIRone (BUSPAR) 5 MG tablet  Take 2 tablets (10 mg total) by mouth 2 (two) times daily as needed.   oxyCODONE (OXY IR/ROXICODONE) 5 MG immediate release tablet Take 1 tablet (5 mg total) by mouth every 8 (eight) hours as needed for severe pain. Must last 30 days.   [DISCONTINUED] heparin 1000 unit/ml SOLN injection Inject into the peritoneum as needed. Three times per week before dialysis (Patient not taking: Reported on 09/10/2022)   No facility-administered encounter medications on file as of 09/10/2022.    Allergies (verified) Cinnamon, Garlic, Onion, Tylenol [acetaminophen], Losartan potassium, Amlodipine, Ciprofloxacin, Clonidine derivatives, Ginger, Hydralazine, Lisinopril, Metoprolol, Prednisone, and Statins   History: Past Medical History:  Diagnosis Date    Allergy    Anemia    Anxiety    worse when not at home - bowel incontinence   Arthritis    Ataxia    Chest pain    a. 10/2016 MV: EF 57%, no ischemia/infarct; b. 04/2022 MV: EF 43%, no ischemia/infarct. Heavy 3 vessel cor Ca2+ and mod aortic atherosclerosis.   COPD (chronic obstructive pulmonary disease) ([place])    Depression    Diabetes mellitus with complication (HCC)    Diastolic dysfunction    a. 06/2016 Echo: EF 55-60%; b. 05/2017 Echo: EF 50-55%; c. 05/2018 Echo: EF 55-60%, no rwma, GrI DD, mild AI, mild-mod MR, mod dil LA. PASP 79mHg; d. 05/2022 Echo: EF 60-65%, no rwma, mild LVH, GrI DD, nl RV fxn, mild-mod dil LA, mild-mod MR, mild AI.   ESRD on hemodialysis ([facility]    MWF dialysis   Fistula    left upper arm   Hepatitis 2003   Hep C   Hiatal hernia    Hypercholesterolemia    Hypertension    Migraine    Mitral regurgitation    a. 05/2022 Echo: mild-mod MR.   Neuropathy, diabetic ([place]    lower legs   [place]-alcoholic cirrhosis ([place]    Palpitations    a. 06/2018 Zio: RSR, 74, rare PACs and PVCs.  No significant arrhythmia.   Peripheral vascular disease ([place]    Pneumonia 2015   Psoriasis    Tobacco dependence    Wears dentures    full upper, partial lower   Past Surgical History:  Procedure Laterality Date   A/V FISTULAGRAM Left 04/23/2020   Procedure: A/V FISTULAGRAM;  Surgeon: SKatha Cabal MD;  [facility];  Service: Cardiovascular;  Laterality: Left;   A/V SHUNT INTERVENTION N/A 12/16/2017   Procedure: A/V SHUNT INTERVENTION;  Surgeon: DAlgernon Huxley MD;  [facility];  Service: Cardiovascular;  Laterality: N/A;   AQUEOUS SHUNT Left 12/20/2019   Procedure: AHMED TUBE SHUNT WITH TUTOPLAST AND AC WASHOUT LEFT DIABETIC;  Surgeon: BLeandrew Koyanagi MD;  [place]  Service: Ophthalmology;  Laterality: Left;  Diabetic - insulin   AV FISTULA PLACEMENT Left 11/2014   CESAREAN SECTION     CHOLECYSTECTOMY      COLONOSCOPY N/A 09/22/2017   Procedure: COLONOSCOPY;  Surgeon: VLin Landsman MD;  [place] [facility]  Service: Endoscopy;  Laterality: N/A;   COLONOSCOPY WITH PROPOFOL N/A 04/16/2020   Procedure: COLONOSCOPY WITH PROPOFOL;  Surgeon: WLucilla Lame MD;  [facility]  Service: Endoscopy;  Laterality: N/A;  Priority 4   cyst removed  from left hand Left 1989   DIALYSIS/PERMA CATHETER INSERTION N/A 05/20/2017   Procedure: Dialysis/Perma Catheter Insertion and fistulagram/LUE angiogram;  Surgeon: DAlgernon Huxley MD;  [facility];  Service: Cardiovascular;  Laterality: N/A;   ESOPHAGOGASTRODUODENOSCOPY N/A 09/22/2017   Procedure: ESOPHAGOGASTRODUODENOSCOPY (EGD);  Surgeon: Lin Landsman, MD;  Location: [place];  Service: Endoscopy;  Laterality: N/A;   ESOPHAGOGASTRODUODENOSCOPY (EGD) WITH PROPOFOL N/A 12/04/2021   Procedure: ESOPHAGOGASTRODUODENOSCOPY (EGD) WITH PROPOFOL;  Surgeon: [name], MD;  Location: [facility];  Service: Endoscopy;  Laterality: N/A;   EYE SURGERY Right 09/02/2022   INCISION AND DRAINAGE ABSCESS N/A 07/16/2016   Procedure: INCISION AND DRAINAGE ABSCESS;  Surgeon: Carloyn Manner, MD;  Location: [facility];  Service: ENT;  Laterality: N/A;   PERIPHERAL VASCULAR CATHETERIZATION N/A 07/25/2015   Procedure: A/V Shuntogram/Fistulagram;  Surgeon: Algernon Huxley, MD;  Location: [place] [facility];  Service: Cardiovascular;  Laterality: N/A;   PERIPHERAL VASCULAR CATHETERIZATION Left 07/25/2015   Procedure: A/V Shunt Intervention;  Surgeon: Algernon Huxley, MD;  Location: [place] [facility];  Service: Cardiovascular;  Laterality: Left;   PERIPHERAL VASCULAR CATHETERIZATION Left 10/07/2015   Procedure: A/V Shuntogram/Fistulagram;  Surgeon: Algernon Huxley, MD;  Location: [place] [facility];  Service: Cardiovascular;  Laterality: Left;   PERIPHERAL VASCULAR CATHETERIZATION N/A 10/07/2015   Procedure: A/V Shunt  Intervention;  Surgeon: Algernon Huxley, MD;  Location: [place] [facility];  Service: Cardiovascular;  Laterality: N/A;   PERIPHERAL VASCULAR CATHETERIZATION  10/07/2015   Procedure: Dialysis/Perma Catheter Insertion;  Surgeon: Algernon Huxley, MD;  Location: [place] [facility];  Service: Cardiovascular;;   PERIPHERAL VASCULAR CATHETERIZATION N/A 12/17/2015   Procedure: Dialysis/Perma Catheter Removal;  Surgeon: Katha Cabal, MD;  Location: [place] [facility];  Service: Cardiovascular;  Laterality: N/A;   PERIPHERAL VASCULAR CATHETERIZATION N/A 01/11/2017   Procedure: Visceral Angiography;  Surgeon: Algernon Huxley, MD;  Location: [place] [facility];  Service: Cardiovascular;  Laterality: N/A;   PERIPHERAL VASCULAR CATHETERIZATION N/A 01/11/2017   Procedure: Visceral Artery Intervention;  Surgeon: Algernon Huxley, MD;  Location: [place] [facility];  Service: Cardiovascular;  Laterality: N/A;   POLYPECTOMY  09/22/2017   Procedure: POLYPECTOMY;  Surgeon: Lin Landsman, MD;  Location: [place];  Service: Endoscopy;;   rt. tubal and ovary removed     STENT PLACEMENT VASCULAR ([place] [facility]) Left 03/2018   Performed at [facility] using EV3 protg GPS stent graph [id number] lot [id number]   TONSILLECTOMY     UPPER EXTREMITY ANGIOGRAPHY Left 09/29/2019   Procedure: UPPER EXTREMITY ANGIOGRAPHY;  Surgeon: Katha Cabal, MD;  Location: [place] [facility];  Service: Cardiovascular;  Laterality: Left;   Family History  Problem Relation Age of Onset   Hypertension Mother    Heart disease Mother        History of heart caths but patient does not think she has required interventions.   Hypertension Father    Skin cancer Father    Social History   Socioeconomic History   Marital status: Legally Separated    Spouse name: Not on file   Number of children: Not on file   Years of education: Not on file   Highest education level: Not on file  Occupational History    Occupation: disabled  Tobacco Use   Smoking status: Former    Packs/day: 0.25    Years: 20.00    Total pack years: 5.00    Types: Cigarettes    Quit date: 08/13/2021    Years since quitting: 1.0   Smokeless tobacco: Never  Vaping Use   Vaping Use: Never used  Substance and Sexual Activity  Alcohol use: No   Drug use: No   Sexual activity: Not on file  Other Topics Concern   Not on file  Social History Narrative   Patient lives at an extended stay hotel currently.  She lives with her boyfriend and son.  She does not routinely exercise.   Social Determinants of Health   Financial Resource Strain: High Risk (09/10/2022)   Overall Financial Resource Strain (CARDIA)    Difficulty of Paying Living Expenses: Hard  Food Insecurity: No Food Insecurity (09/10/2022)   Hunger Vital Sign    Worried About Running Out of Food in the Last Year: Never true    Ran Out of Food in the Last Year: Never true  Transportation Needs: No Transportation Needs (09/10/2022)   PRAPARE - Hydrologist (Medical): No    Lack of Transportation (Non-Medical): No  Physical Activity: Inactive (09/10/2022)   Exercise Vital Sign    Days of Exercise per Week: 0 days    Minutes of Exercise per Session: 0 min  Stress: Stress Concern Present (09/10/2022)   [place]    Feeling of Stress : Rather much  Social Connections: Moderately Isolated (09/10/2022)   Social Connection and Isolation Panel [NHANES]    Frequency of Communication with Friends and Family: More than three times a week    Frequency of Social Gatherings with Friends and Family: Never    Attends Religious Services: Never    Printmaker: No    Attends Music therapist: Never    Marital Status: Living with partner    Tobacco Counseling Counseling given: Not Answered   Clinical Intake:  Pre-visit preparation  completed: Yes  Pain : 0-10 Pain Score: 6  Pain Type: Chronic pain Pain Location: Back  Diabetes: Yes CBG done?: No Did pt. bring in CBG monitor from home?: No  How often do you need to have someone help you when you read instructions, pamphlets, or other written materials from your doctor or pharmacy?: 2 - Rarely  Diabetic- Yes  Interpreter Needed?: No  Activities of Daily Living    09/10/2022   11:07 AM  In your present state of health, do you have any difficulty performing the following activities:  Hearing? 0  Vision? 0  Difficulty concentrating or making decisions? 0  Walking or climbing stairs? 1  Dressing or bathing? 0  Doing errands, shopping? 1  Comment recent eye surgery    Patient Care Team: Venita Lick, NP as PCP - General (Nurse Practitioner) Wellington Hampshire, MD as PCP - Cardiology (Cardiology) Vernon Prey, MD as Referring Physician (Ophthalmology) Magnus Sinning, MD as Referring Physician (Nephrology) Sharlotte Alamo, DPM as Consulting Physician (Podiatry) Lucky Cowboy, Erskine Squibb, MD as Referring Physician (Vascular Surgery)  Indicate any recent Medical Services you may have received from other than [facility] providers in the past year (date may be approximate).     Assessment:   This is a routine wellness examination for [name].  Hearing/Vision screen No results found.  Dietary issues and exercise activities discussed:     Goals Addressed   None   Depression Screen    09/10/2022   11:06 AM 04/16/2022   10:14 AM 03/18/2022   10:34 AM 11/11/2021   10:04 AM 08/21/2021    9:30 AM 07/08/2021    8:40 AM 04/01/2021    9:39 AM  PHQ 2/9 Scores  PHQ - 2 Score  0 0 '4 4 4 '$ 0 0  PHQ- 9 Score '4 7 14 17 15      '$ Fall Risk    09/10/2022   11:06 AM 06/30/2022    8:25 AM 04/16/2022   10:14 AM 03/18/2022   10:33 AM 01/06/2022    8:18 AM  Fall Risk   Falls in the past year? 1 0 0 1 0  Number falls in past yr: 1  0 1   Injury with Fall? '1  1 1   '$ Risk for fall due  to : Impaired balance/gait;Impaired mobility  No Fall Risks Impaired balance/gait   Follow up Falls evaluation completed  Falls evaluation completed Falls evaluation completed     FALL RISK PREVENTION PERTAINING TO THE HOME:  Any stairs in or around the home? Yes  If so, are there any without handrails? No  Home free of loose throw rugs in walkways, pet beds, electrical cords, etc? Yes  Adequate lighting in your home to reduce risk of falls? Yes   ASSISTIVE DEVICES UTILIZED TO PREVENT FALLS:  Life alert? No  Use of a cane, walker or w/c? Yes  Grab bars in the bathroom? Yes  Shower chair or bench in shower? Yes  Elevated toilet seat or a handicapped toilet? Yes   TIMED UP AND GO:  Was the test performed? Yes .   Cognitive Function:      09/10/2022   11:21 AM 09/10/2022   11:17 AM  6CIT Screen  What Year? 0 points 0 points  What month? 0 points 0 points  What time? 0 points 0 points  Count back from 20 0 points 0 points  Months in reverse 0 points 0 points  Repeat phrase 2 points 2 points  Total Score 2 points 2 points    Immunizations Immunization History  Administered Date(s) Administered   Hep A / Hep B 08/08/2014   Hepatitis B, adult 06/17/2015, 07/15/2015, 08/19/2015, 12/20/2015, 06/04/2017, 07/05/2017, 08/02/2017   Influenza Inj Mdck Quad Pf 10/10/2021   Influenza Split 09/20/2014, 08/30/2015, 09/09/2016   Influenza,inj,Quad PF,6+ Mos 09/09/2016, 10/11/2017, 10/07/2018, 09/20/2019   Influenza-Unspecified 09/20/2014, 08/30/2015, 09/09/2016, 10/11/2017, 10/07/2018, 09/20/2019, 10/11/2020   PPD Test 02/18/2015, 02/12/2016, 05/17/2017, 05/04/2018, 04/24/2019, 03/27/2020, 03/28/2021   Pneumococcal Polysaccharide-23 09/20/2012, 06/26/2015, 05/27/2020   Pneumococcal-Unspecified 06/26/2015, 05/27/2020   Tdap 11/11/2021    TDAP status: Up to date  Flu Vaccine status: Completed at today's visit  Pneumococcal vaccine status: Up to date  Covid-19 vaccine status:  Declined, Education has been provided regarding the importance of this vaccine but patient still declined. Advised may receive this vaccine at local pharmacy or Health Dept.or vaccine clinic. Aware to provide a copy of the vaccination record if obtained from local pharmacy or Health Dept. Verbalized acceptance and understanding.  Qualifies for Shingles Vaccine? Yes   Zostavax completed No   Shingrix Completed?: No.    Education has been provided regarding the importance of this vaccine. Patient has been advised to call insurance company to determine out of pocket expense if they have not yet received this vaccine. Advised may also receive vaccine at local pharmacy or Health Dept. Verbalized acceptance and understanding.  Screening Tests Health Maintenance  Topic Date Due   COVID-19 Vaccine (1) Never done   Zoster Vaccines- Shingrix (1 of 2) Never done   PAP SMEAR-Modifier  Never done   INFLUENZA VACCINE  07/21/2022   MAMMOGRAM  11/11/2022 (Originally 09/13/2014)   HEMOGLOBIN A1C  03/11/2023   OPHTHALMOLOGY EXAM  07/29/2023  FOOT EXAM  09/11/2023   COLONOSCOPY (Pts 45-15yr Insurance coverage will need to be confirmed)  04/16/2025   TETANUS/TDAP  11/12/2031   Hepatitis C Screening  Completed   HIV Screening  Completed   HPV VACCINES  Aged Out    Health Maintenance  Health Maintenance Due  Topic Date Due   COVID-19 Vaccine (1) Never done   Zoster Vaccines- Shingrix (1 of 2) Never done   PAP SMEAR-Modifier  Never done   INFLUENZA VACCINE  07/21/2022    Colorectal cancer screening: Type of screening: Colonoscopy. Completed 04/16/20. Repeat every 5 years  Mammogram status: Ordered 11/11/2021. Pt provided with contact info and advised to call to schedule appt.   Lung Cancer Screening: (Low Dose CT Chest recommended if Age 58-80years, 30 pack-year currently smoking OR have quit w/in 15years.) does qualify.   Lung Cancer Screening Referral: Placed.  Additional  Screening:  Hepatitis C Screening: does qualify; Completed 09/10/22  Vision Screening: Recommended annual ophthalmology exams for early detection of glaucoma and other disorders of the eye. Is the patient up to date with their annual eye exam?  Yes  Who is the provider or what is the name of the office in which the patient attends annual eye exams? [facility] provider, would they like to be referred to a provider to establish care? No .   Dental Screening: Recommended annual dental exams for proper oral hygiene  Community Resource Referral / Chronic Care Management: CRR required this visit?  No   CCM required this visit?  No      Plan:     I have personally reviewed and noted the following in the patient's chart:   Medical and social history Use of alcohol, tobacco or illicit drugs  Current medications and supplements including opioid prescriptions. Patient is currently taking opioid prescriptions. Information provided to patient regarding non-opioid alternatives. Patient advised to discuss non-opioid treatment plan with their provider. Functional ability and status Nutritional status Physical activity Advanced directives List of other physicians Hospitalizations, surgeries, and ER visits in previous 12 months Vitals Screenings to include cognitive, depression, and falls Referrals and appointments  In addition, I have reviewed and discussed with patient certain preventive protocols, quality metrics, and best practice recommendations. A written personalized care plan for preventive services as well as general preventive health recommendations were provided to patient.     BGeorgina Peer [place]  09/10/2022   Nurse Notes: Face to Face 30 minutes.     Ms. RFeeser, Thank you for taking time to come for your Medicare Wellness Visit. I appreciate your ongoing commitment to your health goals. Please review the following plan we discussed  and let me know if I can assist you in the future.   These are the goals we discussed:  Goals   None     This is a list of the screening recommended for you and due dates:  Health Maintenance  Topic Date Due   COVID-19 Vaccine (1) Never done   Zoster (Shingles) Vaccine (1 of 2) Never done   Pap Smear  Never done   Flu Shot  07/21/2022   Mammogram  11/11/2022*   Hemoglobin A1C  03/11/2023   Eye exam for diabetics  07/29/2023   Complete foot exam   09/11/2023   Colon Cancer Screening  04/16/2025   Tetanus Vaccine  11/12/2031   Hepatitis C Screening: USPSTF Recommendation to screen - Ages 18-79 yo.  Completed   HIV Screening  Completed   HPV Vaccine  Aged Out  *Topic was postponed. The date shown is not the original due date.

## 2022-09-10 NOTE — Assessment & Plan Note (Signed)
Refer to diabetes with ESRD plan of care.

## 2022-09-10 NOTE — Assessment & Plan Note (Addendum)
Chronic, taking supplement on occasion.  Recheck today.

## 2022-09-10 NOTE — Assessment & Plan Note (Signed)
Chronic, ongoing with recent dialysis A1c 6.7% today, downward trend from previous.  At this time will continue Lantus and Novolog as ordered and adjust if needed in future.  Recommend she check blood sugar 3-4 times a day, may benefit from Freestyle in future.  Focus on diabetic diet at home.  Return in 3 months.

## 2022-09-10 NOTE — Assessment & Plan Note (Signed)
Followed by [place] every 6 weeks, continue this collaboration.

## 2022-09-10 NOTE — Assessment & Plan Note (Signed)
No smoking since August 2022 -- recommend continued cessation.

## 2022-09-10 NOTE — Assessment & Plan Note (Signed)
Chronic, stable.  Continue to monitor and continue to assess for skin breakdown.

## 2022-09-10 NOTE — Assessment & Plan Note (Signed)
Refer to depression plan of care. 

## 2022-09-10 NOTE — Assessment & Plan Note (Addendum)
Chronic,ongoing.  Followed by rheumatology and pain management, continue these collaborations.  Recent notes reviewed.  Continue medications as prescribed by them.

## 2022-09-10 NOTE — Assessment & Plan Note (Signed)
Chronic, ongoing. Denies SI/HI.  She does not want maintenance medication.  Would avoid benzos due to her opioid use for pain, discussed at length with her.  At this time will stop Buspar and start Vistaril to take as needed before dialysis for anxiety.  She would benefit from a maintenance regimen in future that is renal/liver dosed, but declines today.

## 2022-09-11 NOTE — Progress Notes (Signed)
Contacted via [place] -- please call and ensure she got message: Good morning Izora Gala, all labs have returned except Magnesium which is still pending.  If abnormal I will let you know.   - Cholesterol labs remain elevated -- in past you did not tolerate statins correct?  What happened? Would be good to have cholesterol medication on board. - Vitamin D level low, I recommend you take Vitamin D3 2000 units daily for bone health.  You can obtain over the counter. - Kidney function remains at baseline -- potassium is quite elevated please let your dialysis crew know as they may need to adjust a bit + reduce potassium rich foods in diet.  Would like to recheck in two weeks on outpatient las please or in dialysis have them check and let me know what it is.  Any questions? Keep being amazing!!  Thank you for allowing me to participate in your care.  I appreciate you. Kindest regards, Liv Rallis

## 2022-09-12 ENCOUNTER — Encounter: Payer: Self-pay | Admitting: Nurse Practitioner

## 2022-09-12 LAB — COMPREHENSIVE METABOLIC PANEL
ALT: 15 IU/L (ref 0–32)
AST: 19 IU/L (ref 0–40)
Albumin/Globulin Ratio: 1.3 (ref 1.2–2.2)
Albumin: 4.3 g/dL (ref 3.8–4.9)
Alkaline Phosphatase: 186 IU/L — ABNORMAL HIGH (ref 44–121)
BUN/Creatinine Ratio: 8 — ABNORMAL LOW (ref 9–23)
BUN: 56 mg/dL — ABNORMAL HIGH (ref 6–24)
Bilirubin Total: 0.4 mg/dL (ref 0.0–1.2)
CO2: 21 mmol/L (ref 20–29)
Calcium: 7.4 mg/dL — ABNORMAL LOW (ref 8.7–10.2)
Chloride: 96 mmol/L (ref 96–106)
Creatinine, Ser: 6.93 mg/dL — ABNORMAL HIGH (ref 0.57–1.00)
Globulin, Total: 3.4 g/dL (ref 1.5–4.5)
Glucose: 113 mg/dL — ABNORMAL HIGH (ref 70–99)
Potassium: 6.3 mmol/L — ABNORMAL HIGH (ref 3.5–5.2)
Sodium: 139 mmol/L (ref 134–144)
Total Protein: 7.7 g/dL (ref 6.0–8.5)
eGFR: 6 mL/min/{1.73_m2} — ABNORMAL LOW (ref >59)

## 2022-09-12 LAB — LIPID PANEL W/O CHOL/HDL RATIO
Cholesterol, Total: 175 mg/dL (ref 100–199)
HDL: 41 mg/dL (ref >39)
LDL Chol Calc (NIH): 115 mg/dL — ABNORMAL HIGH (ref 0–99)
Triglycerides: 105 mg/dL (ref 0–149)
VLDL Cholesterol Cal: 19 mg/dL (ref 5–40)

## 2022-09-12 LAB — VITAMIN D 25 HYDROXY (VIT D DEFICIENCY, FRACTURES): Vit D, 25-Hydroxy: 13.5 ng/mL — ABNORMAL LOW (ref 30.0–100.0)

## 2022-09-12 LAB — MAGNESIUM: Magnesium: 1.9 mg/dL (ref 1.6–2.3)

## 2022-09-21 ENCOUNTER — Other Ambulatory Visit: Payer: Self-pay | Admitting: Nurse Practitioner

## 2022-09-21 DIAGNOSIS — E1122 Type 2 diabetes mellitus with diabetic chronic kidney disease: Secondary | ICD-10-CM

## 2022-09-21 NOTE — Telephone Encounter (Signed)
Requested medications are due for refill today.  no  Requested medications are on the active medications list.  yes  Last refill. 05/20/2022 10 ml 5 rf  Future visit scheduled.   yes  Notes to clinic.  Pharmacy comment: Alternative Requested:THE PRESCRIBED MEDICATION IS NOT COVERED BY INSURANCE. PLEASE CONSIDER CHANGING TO ONE OF THE SUGGESTED COVERED ALTERNATIVES.    Requested Prescriptions  Pending Prescriptions Disp Refills   LEVEMIR FLEXTOUCH 100 UNIT/ML FlexTouch Pen [Pharmacy Med Name: LEVEMIR FLEXTOUCH 100 UNIT/ML]  0     Endocrinology:  Diabetes - Insulins Passed - 09/21/2022  8:01 AM      Passed - HBA1C is between 0 and 7.9 and within 180 days    Hemoglobin A1C  Date Value Ref Range Status  07/20/2021 8.2  Final    Comment:    [facility]   HB A1C ([facility] DCA - WAIVED)  Date Value Ref Range Status  09/10/2022 6.7 (H) 4.8 - 5.6 % Final    Comment:             Prediabetes: 5.7 - 6.4          Diabetes: >6.4          Glycemic control for adults with diabetes: <7.0          Passed - Valid encounter within last 6 months    Recent Outpatient Visits           1 week ago Diabetic polyneuropathy associated with type 2 diabetes mellitus ([place])   [place], Jolene T, NP   5 months ago Hypertension associated with diabetes ([place])   [place], Jolene T, NP   6 months ago Type 2 diabetes mellitus with ESRD (end-stage renal disease) ([place])   [place], Jolene T, NP   10 months ago Type 2 diabetes mellitus with ESRD (end-stage renal disease) ([place])   [place], Jolene T, NP   1 year ago Type 2 diabetes mellitus with ESRD (end-stage renal disease) ([place])   [facility] Olin Hauser, DO       Future Appointments             In 2 months [place], Barbaraann Faster, NP [facility]

## 2022-09-22 ENCOUNTER — Other Ambulatory Visit: Payer: Self-pay | Admitting: Nurse Practitioner

## 2022-09-22 ENCOUNTER — Ambulatory Visit
Payer: 59 | Attending: Student in an Organized Health Care Education/Training Program | Admitting: Student in an Organized Health Care Education/Training Program

## 2022-09-22 ENCOUNTER — Encounter: Payer: Self-pay | Admitting: Student in an Organized Health Care Education/Training Program

## 2022-09-22 VITALS — BP 151/75 | HR 64 | Temp 97.1°F | Resp 16 | Ht 66.0 in | Wt 232.0 lb

## 2022-09-22 DIAGNOSIS — M5412 Radiculopathy, cervical region: Secondary | ICD-10-CM | POA: Insufficient documentation

## 2022-09-22 DIAGNOSIS — M47816 Spondylosis without myelopathy or radiculopathy, lumbar region: Secondary | ICD-10-CM | POA: Diagnosis present

## 2022-09-22 DIAGNOSIS — G894 Chronic pain syndrome: Secondary | ICD-10-CM | POA: Diagnosis present

## 2022-09-22 DIAGNOSIS — E1142 Type 2 diabetes mellitus with diabetic polyneuropathy: Secondary | ICD-10-CM | POA: Insufficient documentation

## 2022-09-22 DIAGNOSIS — M5416 Radiculopathy, lumbar region: Secondary | ICD-10-CM | POA: Insufficient documentation

## 2022-09-22 MED ORDER — OXYCODONE HCL 5 MG PO TABS: 5.0000 mg | ORAL_TABLET | Freq: Three times a day (TID) | ORAL | 0 refills | Status: DC | PRN

## 2022-09-22 NOTE — Progress Notes (Signed)
PROVIDER NOTE: Information contained herein reflects review and annotations entered in association with encounter. Interpretation of such information and data should be left to medically-trained personnel. Information provided to patient can be located elsewhere in the medical record under "Patient Instructions". Document created using STT-dictation technology, any transcriptional errors that may result from process are unintentional.    Patient: [name]  Service Category: E/M  Provider: Gillis Santa, MD  DOB: 1966-06-23  DOS: 09/22/2022  Specialty: Interventional Pain Management  MRN: [id number]  Setting: Ambulatory outpatient  PCP: Venita Lick, NP  Type: Established Patient    Referring Provider: Venita Lick, NP  Location: Office  Delivery: Face-to-face     HPI  [name], a 56 y.o. year old female, is here today because of her Diabetic polyneuropathy associated with type 2 diabetes mellitus ([place]) [E11.42]. [name] primary complain today is Back Pain (lower) Last encounter: My last encounter with her was on 06/30/22 Pertinent problems: [name] has ESRD (end stage renal disease) on dialysis [facility]); Type 2 diabetes mellitus with ESRD (end-stage renal disease) ([place]); Chronic bilateral low back pain; DDD (degenerative disc disease), cervical; Spondylosis of cervical region without myelopathy or radiculopathy; PAD (peripheral artery disease) ([place]); Chronic pain syndrome; Diabetic polyneuropathy associated with type 2 diabetes mellitus ([place]); Cervical radiculopathy; Lumbar radiculopathy; GAD (generalized anxiety disorder); and Recurrent major depression in partial remission (HCC) on their pertinent problem list. Pain Assessment: Severity of Chronic pain is reported as a 8 /10. Location: Back  /denies. Onset: More than a month ago. Quality: Aching, Discomfort, Constant (meat hooks in my back). Timing: Constant. Modifying factor(s): medications. Vitals:  height is $RemoveB'5\' 6"'SQnmmhkR$  (1.676 m)  and weight is 232 lb (105.2 kg). Her temperature is 97.1 F (36.2 C) (abnormal). Her blood pressure is 151/75 (abnormal) and her pulse is 64. Her respiration is 16 and oxygen saturation is 99%.   Reason for encounter: medication management. Right eye pain   S/p right eye surgery for diabetic retinopathy and previous retinal hemorrhage. Endorsing a lot of right eye discomfort. Has appt today with ophthalmologist. Otherwise patient states that she has lost over 10 pounds.  She does resistant band training every morning.  She has been consistent with this and has noticed increased strength and mood.  She continues to go to dialysis.  She states that her A1c has reduced from 7.5-6.5.  She states that she is also eating better.  She is dieting.   06/30/22 [name] states that she is having increased cervical spine pain that radiates into bilateral arms and hands.  The pain is so severe that she is unable to tolerate dialysis at times.  She is wondering if a neck brace could be helpful for her.  We have tried a cervical epidural steroid injection in the past with limited response.  Her previous cervical MRI done 03/11/2022 shows significant C5-C6 disc protrusion with endplate spurring right greater than left disc osteophyte complex with facet arthrosis and ligamentum flavum hypertrophy.  She has severe spinal canal stenosis and measures 6 mm in AP dimension.  Bilateral neuroforaminal narrowing that is severe, on the right and moderate on the left.  This is a progression from her last cervical MRI.  Based upon this, I have offered the patient repeat cervical epidural injection with saline only and local anesthetic without steroid.  I have also offered a patient a referral to neurosurgery however she states that she needs to follow back up with Dr. Saintclair Halsted who initially  ordered lumbar and cervical MRI.  I recommend that she visit a medical device store for a neck brace.  She is instructed to have them fax [place] a  prescription request if she finds one that would be beneficial for her.  Pharmacotherapy Assessment  Analgesic: Oxycodone 5 mg q8 hrs prn quantity 75/month; MME equals 22.5    Monitoring: [place] PMP: PDMP reviewed during this encounter.       Pharmacotherapy: No side-effects or adverse reactions reported. Compliance: No problems identified. Effectiveness: Clinically acceptable.  Ignatius Specking, RN  09/22/2022  8:27 AM  Sign when Signing Visit Nursing Pain Medication Assessment:  Safety precautions to be maintained throughout the outpatient stay will include: orient to surroundings, keep bed in low position, maintain call bell within reach at all times, provide assistance with transfer out of bed and ambulation.  Medication Inspection Compliance: Pill count conducted under aseptic conditions, in front of the patient. Neither the pills nor the bottle was removed from the patient's sight at any time. Once count was completed pills were immediately returned to the patient in their original bottle.  Medication: Oxycodone IR Pill/Patch Count:  6 of 75 pills remain Pill/Patch Appearance: Markings consistent with prescribed medication Bottle Appearance: Standard pharmacy container. Clearly labeled. Filled Date: 80 / 5 / 2023 Last Medication intake:  Yesterday     UDS:  No results found for: "SUMMARY"   ROS  Constitutional: Denies any fever or chills Gastrointestinal: No reported hemesis, hematochezia, vomiting, or acute GI distress Musculoskeletal:  Neck pain with radiation into bilateral arms Neurological: No reported episodes of acute onset apraxia, aphasia, dysarthria, agnosia, amnesia, paralysis, loss of coordination, or loss of consciousness  Medication Review  Abatacept, FQ Protective Underwear, INSULIN SYRINGE .5CC/29G, Vitamin D (Ergocalciferol), brimonidine, diltiazem, dorzolamidel-timolol, furosemide, glucose blood, hydrOXYzine, insulin aspart, insulin glargine, latanoprost,  leflunomide, levocetirizine, midodrine, ofloxacin, omeprazole, oxyCODONE, prednisoLONE acetate, and promethazine  History Review  Allergy: Ms. Detert is allergic to cinnamon, garlic, onion, tylenol [acetaminophen], losartan potassium, amlodipine, ciprofloxacin, clonidine derivatives, ginger, hydralazine, lisinopril, metoprolol, prednisone, and statins. Drug: Ms. Cornacchia  reports no history of drug use. Alcohol:  reports no history of alcohol use. Tobacco:  reports that she quit smoking about 13 months ago. Her smoking use included cigarettes. She has a 5.00 pack-year smoking history. She has never used smokeless tobacco. Social: Ms. Barbier  reports that she quit smoking about 13 months ago. Her smoking use included cigarettes. She has a 5.00 pack-year smoking history. She has never used smokeless tobacco. She reports that she does not drink alcohol and does not use drugs. Medical:  has a past medical history of Allergy, Anemia, Anxiety, Arthritis, Ataxia, Chest pain, COPD (chronic obstructive pulmonary disease) ([place]), Depression, Diabetes mellitus with complication ([place]), Diastolic dysfunction, ESRD on hemodialysis ([place]), Fistula, Hepatitis (2003), Hiatal hernia, Hypercholesterolemia, Hypertension, Migraine, Mitral regurgitation, Neuropathy, diabetic ([place]), Non-alcoholic cirrhosis ([place]), Palpitations, Peripheral vascular disease ([place]), Pneumonia (2015), Psoriasis, Tobacco dependence, and Wears dentures. Surgical: Ms. Manganiello  has a past surgical history that includes Tonsillectomy; rt. tubal and ovary removed; Cholecystectomy; Cardiac catheterization (N/A, 07/25/2015); Cardiac catheterization (Left, 07/25/2015); Cardiac catheterization (Left, 10/07/2015); Cardiac catheterization (N/A, 10/07/2015); Cardiac catheterization (10/07/2015); Cardiac catheterization (N/A, 12/17/2015); Incision and drainage abscess (N/A, 07/16/2016); cyst removed  from left hand (Left, 1989); AV fistula placement (Left, 11/2014); Cardiac  catheterization (N/A, 01/11/2017); Cardiac catheterization (N/A, 01/11/2017); DIALYSIS/PERMA CATHETER INSERTION (N/A, 05/20/2017); Cesarean section; Colonoscopy (N/A, 09/22/2017); Esophagogastroduodenoscopy (N/A, 09/22/2017); polypectomy (09/22/2017); A/V SHUNT INTERVENTION (N/A, 12/16/2017); STENT PLACEMENT VASCULAR ([place] [facility]) (  Left, 03/2018); Upper Extremity Angiography (Left, 09/29/2019); Aqueous shunt (Left, 12/20/2019); Colonoscopy with propofol (N/A, 04/16/2020); A/V Fistulagram (Left, 04/23/2020); Esophagogastroduodenoscopy (egd) with propofol (N/A, 12/04/2021); and Eye surgery (Right, 09/02/2022). Family: family history includes Heart disease in her mother; Hypertension in her father and mother; Skin cancer in her father.  Laboratory Chemistry Profile   Renal Lab Results  Component Value Date   BUN 56 (H) 09/10/2022   CREATININE 6.93 (H) 09/10/2022   BCR 8 (L) 09/10/2022   GFRAA 6 (L) 05/01/2019   GFRNONAA 6 (L) 05/01/2019    Hepatic Lab Results  Component Value Date   AST 19 09/10/2022   ALT 15 09/10/2022   ALBUMIN 4.3 09/10/2022   ALKPHOS 186 (H) 09/10/2022   HCVAB >11.0 (H) 12/16/2016   LIPASE 21 04/30/2019    Electrolytes Lab Results  Component Value Date   NA 139 09/10/2022   K 6.3 (H) 09/10/2022   CL 96 09/10/2022   CALCIUM 7.4 (L) 09/10/2022   MG 1.9 09/10/2022   PHOS 4.2 05/01/2019    Bone Lab Results  Component Value Date   VD25OH 13.5 (L) 09/10/2022    Inflammation (CRP: Acute Phase) (ESR: Chronic Phase) Lab Results  Component Value Date   CRP <0.8 12/16/2016   ESRSEDRATE 117 (H) 10/12/2017   LATICACIDVEN 1.6 04/30/2019         Note: Above Lab results reviewed.  Recent Imaging Review  ECHOCARDIOGRAM COMPLETE    ECHOCARDIOGRAM REPORT       Patient Name:   LEVONNE CARRERAS Date of Exam: 06/04/2022 Medical Rec #:  [id number]     Height:       66.5 in Accession #:    [id number]    Weight:       233.0 lb Date of Birth:  1966-12-04     BSA:          2.146  m Patient Age:    21 years      BP:           179/77 mmHg Patient Gender: F             HR:           66 bpm. Exam Location:  [facility]  Procedure: 2D Echo, Cardiac Doppler, Color Doppler and Strain Analysis  Indications:    94.39, Abnormal stress test   History:        Patient has prior history of Echocardiogram examinations, most                 recent 06/09/2018. PAD, Mitral Valve Disease; Risk                 Factors:Hypertension, Diabetes and Former Smoker.   Sonographer:    Pilar Jarvis RDMS, RVT, RDCS Referring Phys: [id number] CHRISTOPHER RONALD BERGE    Sonographer Comments: Poor [facility] tracking IMPRESSIONS   1. Left ventricular ejection fraction, by estimation, is 60 to 65%. The left ventricle has normal function. The left ventricle has no regional wall motion abnormalities. There is mild left ventricular hypertrophy. Left ventricular diastolic parameters  are consistent with Grade I diastolic dysfunction (impaired relaxation). The average left ventricular global longitudinal strain is -17.3 %. The global longitudinal strain is normal.  2. Right ventricular systolic function is normal. The right ventricular size is normal.  3. Left atrial size was mild to moderately dilated.  4. The mitral valve is normal in structure. Mild to moderate mitral valve regurgitation.  5. The aortic valve was  not well visualized. Aortic valve regurgitation is mild. Aortic valve sclerosis is present, with no evidence of aortic valve stenosis.  6. The inferior vena cava is normal in size with greater than 50% respiratory variability, suggesting right atrial pressure of 3 mmHg.  FINDINGS  Left Ventricle: Left ventricular ejection fraction, by estimation, is 60 to 65%. The left ventricle has normal function. The left ventricle has no regional wall motion abnormalities. The average left ventricular global longitudinal strain is -17.3 %.  The global longitudinal strain is normal. 3D left ventricular ejection  fraction analysis performed but not reported based on interpreter judgement due to suboptimal tracking. The left ventricular internal cavity size was normal in size. There is mild  left ventricular hypertrophy. Left ventricular diastolic parameters are consistent with Grade I diastolic dysfunction (impaired relaxation).  Right Ventricle: The right ventricular size is normal. No increase in right ventricular wall thickness. Right ventricular systolic function is normal.  Left Atrium: Left atrial size was mild to moderately dilated.  Right Atrium: Right atrial size was normal in size.  Pericardium: There is no evidence of pericardial effusion.  Mitral Valve: The mitral valve is normal in structure. Mild to moderate mitral valve regurgitation.  Tricuspid Valve: The tricuspid valve is normal in structure. Tricuspid valve regurgitation is mild.  Aortic Valve: The aortic valve was not well visualized. Aortic valve regurgitation is mild. Aortic valve sclerosis is present, with no evidence of aortic valve stenosis. Aortic valve mean gradient measures 4.0 mmHg. Aortic valve peak gradient measures  7.4 mmHg. Aortic valve area, by VTI measures 2.67 cm.  Pulmonic Valve: The pulmonic valve was not well visualized. Pulmonic valve regurgitation is trivial.  Aorta: The aortic root and ascending aorta are structurally normal, with no evidence of dilitation.  Venous: The inferior vena cava is normal in size with greater than 50% respiratory variability, suggesting right atrial pressure of 3 mmHg.  IAS/Shunts: No atrial level shunt detected by color flow Doppler.    LEFT VENTRICLE PLAX 2D LVIDd:         5.10 cm      Diastology LVIDs:         3.40 cm      LV e' medial:    5.44 cm/s LV PW:         1.10 cm      LV E/e' medial:  18.3 LV IVS:        1.10 cm      LV e' lateral:   7.29 cm/s LVOT diam:     2.10 cm      LV E/e' lateral: 13.7 LV SV:         94 LV SV Index:   44           2D Longitudinal  Strain LVOT Area:     3.46 cm     2D Strain GLS Avg:     -17.3 %   LV Volumes (MOD) LV vol d, MOD A2C: 158.0 ml LV vol d, MOD A4C: 138.0 ml LV vol s, MOD A2C: 72.4 ml LV vol s, MOD A4C: 70.1 ml LV SV MOD A2C:     85.6 ml LV SV MOD A4C:     138.0 ml LV SV MOD BP:      75.5 ml  RIGHT VENTRICLE             IVC RV Basal diam:  3.50 cm     IVC diam: 1.50 cm RV S prime:  11.40 cm/s TAPSE (M-mode): 2.6 cm  LEFT ATRIUM             Index        RIGHT ATRIUM           Index LA diam:        4.70 cm 2.19 cm/m   RA Area:     17.80 cm LA Vol (A2C):   95.3 ml 44.41 ml/m  RA Volume:   45.30 ml  21.11 ml/m LA Vol (A4C):   82.6 ml 38.49 ml/m LA Biplane Vol: 91.7 ml 42.73 ml/m  AORTIC VALVE AV Area (Vmax):    2.62 cm AV Area (Vmean):   2.57 cm AV Area (VTI):     2.67 cm AV Vmax:           136.00 cm/s AV Vmean:          99.600 cm/s AV VTI:            0.350 m AV Peak Grad:      7.4 mmHg AV Mean Grad:      4.0 mmHg LVOT Vmax:         103.00 cm/s LVOT Vmean:        73.900 cm/s LVOT VTI:          0.270 m LVOT/AV VTI ratio: 0.77   AORTA Ao Root diam: 3.30 cm Ao Asc diam:  3.50 cm Ao Arch diam: 3.0 cm  MITRAL VALVE                TRICUSPID VALVE MV Area (PHT): 3.63 cm     TR Peak grad:   37.0 mmHg MV Decel Time: 209 msec     TR Vmax:        304.00 cm/s MV E velocity: 99.80 cm/s MV A velocity: 121.00 cm/s  SHUNTS MV E/A ratio:  0.82         Systemic VTI:  0.27 m                             Systemic Diam: 2.10 cm  Kate Sable MD Electronically signed by Kate Sable MD Signature Date/Time: 06/04/2022/5:01:57 PM      Final   Note: Reviewed        Physical Exam  General appearance: Well nourished, well developed, and well hydrated. In no apparent acute distress Mental status: Alert, oriented x 3 (person, place, & time)       Respiratory: No evidence of acute respiratory distress Eyes: PERLA Vitals: BP (!) 151/75   Pulse 64   Temp (!) 97.1 F (36.2 C)   Resp  16   Ht $R'5\' 6"'hL$  (1.676 m)   Wt 232 lb (105.2 kg)   SpO2 99%   BMI 37.45 kg/m  BMI: Estimated body mass index is 37.45 kg/m as calculated from the following:   Height as of this encounter: $RemoveBeforeD'5\' 6"'AcvzGtJkZuAUhk$  (1.676 m).   Weight as of this encounter: 232 lb (105.2 kg). Ideal: Ideal body weight: 59.3 kg (130 lb 11.7 oz) Adjusted ideal body weight: 77.7 kg (171 lb 3.8 oz)  Cervical Spine Area Exam  Skin & Axial Inspection: No masses, redness, edema, swelling, or associated skin lesions Alignment: Symmetrical Functional ROM: Decreased ROM, to the right Stability: No instability detected Muscle Tone/Strength: Functionally intact. No obvious neuro-muscular anomalies detected. Sensory (Neurological): Dermatomal pain pattern RIGHT C 5,6,7 Palpation: No palpable anomalies             +  spurling's on right      Upper Extremity (UE) Exam      Side: Right upper extremity   Side: Left upper extremity   Skin & Extremity Inspection: Skin color, temperature, and hair growth are WNL. No peripheral edema or cyanosis. No masses, redness, swelling, asymmetry, or associated skin lesions. No contractures.   Skin & Extremity Inspection: Skin color, temperature, and hair growth are WNL. No peripheral edema or cyanosis. No masses, redness, swelling, asymmetry, or associated skin lesions. No contractures.   Functional ROM: Decreased ROM for all joints of upper extremity   Functional ROM: Unrestricted ROM           Muscle Tone/Strength: Functionally intact. No obvious neuro-muscular anomalies detected.   Muscle Tone/Strength: Functionally intact. No obvious neuro-muscular anomalies detected.   Sensory (Neurological): Dermatomal pain pattern affecting the shoulder and elbow   Sensory (Neurological): Unimpaired           Palpation: No palpable anomalies               Palpation: No palpable anomalies               Provocative Test(s):  Phalen's test: deferred Tinel's test: deferred Apley's scratch test (touch opposite shoulder):   Action 1 (Across chest): Decreased ROM Action 2 (Overhead): Decreased ROM Action 3 (LB reach): Decreased ROM     Provocative Test(s):  Phalen's test: deferred Tinel's test: deferred Apley's scratch test (touch opposite shoulder):  Action 1 (Across chest): deferred Action 2 (Overhead): deferred Action 3 (LB reach): deferred     4- out of 5 strength RIGHTupper extremity: Shoulder abduction, elbow flexion, elbow extension, thumb extension. Thoracic Spine Area Exam  Skin & Axial Inspection: No masses, redness, or swelling Alignment: Symmetrical Functional ROM: Unrestricted ROM Stability: No instability detected Muscle Tone/Strength: Functionally intact. No obvious neuro-muscular anomalies detected. Sensory (Neurological): Unimpaired Muscle strength & Tone: No palpable anomalies  Assessment   Diagnosis Status  1. Diabetic polyneuropathy associated with type 2 diabetes mellitus (HCC)   2. Cervical radiculopathy   3. Lumbar spondylosis   4. Lumbar radiculopathy   5. Chronic pain syndrome     Controlled Having a Flare-up Having a Flare-up   Updated Problems: No problems updated.  Plan of Care    [name] has a current medication list which includes the following long-term medication(s): orencia, diltiazem, insulin glargine, leflunomide, levocetirizine, novolog, omeprazole, promethazine, [START ON 09/23/2022] oxycodone, [START ON 10/23/2022] oxycodone, and [START ON 11/22/2022] oxycodone.   1. Diabetic polyneuropathy associated with type 2 diabetes mellitus (HCC) - oxyCODONE (OXY IR/ROXICODONE) 5 MG immediate release tablet; Take 1 tablet (5 mg total) by mouth every 8 (eight) hours as needed for severe pain. Must last 30 days.  Dispense: 75 tablet; Refill: 0 - oxyCODONE (OXY IR/ROXICODONE) 5 MG immediate release tablet; Take 1 tablet (5 mg total) by mouth every 8 (eight) hours as needed for severe pain. Must last 30 days.  Dispense: 75 tablet; Refill: 0 - oxyCODONE (OXY  IR/ROXICODONE) 5 MG immediate release tablet; Take 1 tablet (5 mg total) by mouth every 8 (eight) hours as needed for severe pain. Must last 30 days.  Dispense: 75 tablet; Refill: 0  2. Cervical radiculopathy - oxyCODONE (OXY IR/ROXICODONE) 5 MG immediate release tablet; Take 1 tablet (5 mg total) by mouth every 8 (eight) hours as needed for severe pain. Must last 30 days.  Dispense: 75 tablet; Refill: 0 - oxyCODONE (OXY IR/ROXICODONE) 5 MG immediate release tablet; Take 1  tablet (5 mg total) by mouth every 8 (eight) hours as needed for severe pain. Must last 30 days.  Dispense: 75 tablet; Refill: 0 - oxyCODONE (OXY IR/ROXICODONE) 5 MG immediate release tablet; Take 1 tablet (5 mg total) by mouth every 8 (eight) hours as needed for severe pain. Must last 30 days.  Dispense: 75 tablet; Refill: 0  3. Lumbar spondylosis  4. Lumbar radiculopathy  5. Chronic pain syndrome - oxyCODONE (OXY IR/ROXICODONE) 5 MG immediate release tablet; Take 1 tablet (5 mg total) by mouth every 8 (eight) hours as needed for severe pain. Must last 30 days.  Dispense: 75 tablet; Refill: 0 - oxyCODONE (OXY IR/ROXICODONE) 5 MG immediate release tablet; Take 1 tablet (5 mg total) by mouth every 8 (eight) hours as needed for severe pain. Must last 30 days.  Dispense: 75 tablet; Refill: 0 - oxyCODONE (OXY IR/ROXICODONE) 5 MG immediate release tablet; Take 1 tablet (5 mg total) by mouth every 8 (eight) hours as needed for severe pain. Must last 30 days.  Dispense: 75 tablet; Refill: 0    -Repeat cervical ESI as needed cervical radicular pain flare.    Pharmacotherapy (Medications Ordered): Meds ordered this encounter  Medications   oxyCODONE (OXY IR/ROXICODONE) 5 MG immediate release tablet    Sig: Take 1 tablet (5 mg total) by mouth every 8 (eight) hours as needed for severe pain. Must last 30 days.    Dispense:  75 tablet    Refill:  0    Chronic Pain. (STOP Act - Not applicable). Fill one day early if closed on  scheduled refill date.   oxyCODONE (OXY IR/ROXICODONE) 5 MG immediate release tablet    Sig: Take 1 tablet (5 mg total) by mouth every 8 (eight) hours as needed for severe pain. Must last 30 days.    Dispense:  75 tablet    Refill:  0    Chronic Pain. (STOP Act - Not applicable). Fill one day early if closed on scheduled refill date.   oxyCODONE (OXY IR/ROXICODONE) 5 MG immediate release tablet    Sig: Take 1 tablet (5 mg total) by mouth every 8 (eight) hours as needed for severe pain. Must last 30 days.    Dispense:  75 tablet    Refill:  0    Chronic Pain. (STOP Act - Not applicable). Fill one day early if closed on scheduled refill date.   Orders:  No orders of the defined types were placed in this encounter.  Follow-up plan:   Return in about 3 months (around 12/23/2022) for Medication Management, in person.     Recent Visits Date Type Provider Dept  06/30/22 Office Visit Gillis Santa, MD [facility]  Showing recent visits within past 90 days and meeting all other requirements Today's Visits Date Type Provider Dept  09/22/22 Office Visit Gillis Santa, MD [facility]  Showing today's visits and meeting all other requirements Future Appointments Date Type Provider Dept  12/17/22 Appointment Gillis Santa, MD [facility]  Showing future appointments within next 90 days and meeting all other requirements  I discussed the assessment and treatment plan with the patient. The patient was provided an opportunity to ask questions and all were answered. The patient agreed with the plan and demonstrated an understanding of the instructions.  Patient advised to call back or seek an in-person evaluation if the symptoms or condition worsens.  Duration of encounter: 26minutes.  Note by: Gillis Santa, MD Date: 09/22/2022; Time: 8:48 AM

## 2022-09-22 NOTE — Telephone Encounter (Signed)
Requested medication (s) are due for refill today - yes  Requested medication (s) are on the active medication list -yes  Future visit scheduled -yes  Last refill: 05/20/22 #[id number]  Notes to clinic: no protocol applied- sent for provider review- diabetic medication supplies  Requested Prescriptions  Pending Prescriptions Disp Refills   BD INSULIN SYRINGE U/F 30G X 1/2" 0.5 ML MISC [Pharmacy Med Name: BD INS SYR UF 0.5ML 12.7MMX30G] 100 each 3    Sig: USE 4 TIMES DAILY WITH LANTUS & NOVOLOG     There is no refill protocol information for this order       Requested Prescriptions  Pending Prescriptions Disp Refills   BD INSULIN SYRINGE U/F 30G X 1/2" 0.5 ML MISC [Pharmacy Med Name: BD INS SYR UF 0.5ML 12.7MMX30G] 100 each 3    Sig: USE 4 TIMES DAILY WITH LANTUS & NOVOLOG     There is no refill protocol information for this order

## 2022-09-22 NOTE — Progress Notes (Signed)
Nursing Pain Medication Assessment:  Safety precautions to be maintained throughout the outpatient stay will include: orient to surroundings, keep bed in low position, maintain call bell within reach at all times, provide assistance with transfer out of bed and ambulation.  Medication Inspection Compliance: Pill count conducted under aseptic conditions, in front of the patient. Neither the pills nor the bottle was removed from the patient's sight at any time. Once count was completed pills were immediately returned to the patient in their original bottle.  Medication: Oxycodone IR Pill/Patch Count:  6 of 75 pills remain Pill/Patch Appearance: Markings consistent with prescribed medication Bottle Appearance: Standard pharmacy container. Clearly labeled. Filled Date: 24 / 5 / 2023 Last Medication intake:  Yesterday

## 2022-10-14 ENCOUNTER — Encounter: Payer: Self-pay | Admitting: Nurse Practitioner

## 2022-10-19 ENCOUNTER — Encounter (INDEPENDENT_AMBULATORY_CARE_PROVIDER_SITE_OTHER): Payer: Self-pay

## 2022-10-24 ENCOUNTER — Encounter: Payer: Self-pay | Admitting: Nurse Practitioner

## 2022-10-27 NOTE — Telephone Encounter (Unsigned)
Copied from [place] [phone]. Topic: General - Other >> Oct 27, 2022  8:45 AM Chapman Fitch wrote: Reason for CRM: Pt called to give Destiny the fax # to social services / she has two days left to get this done for her special assistance / Fax# [phone]/ please advise asap

## 2022-11-02 NOTE — Telephone Encounter (Unsigned)
Copied from [place]anda [phone]. Topic: General - Inquiry >> Nov 02, 2022  4:10 PM [name] P wrote: Reason for CRM: Pt called saying the [facility] papers were filled out incorrectly.  She said they should say domicallary? Instead of home.  She said there is a message in her my chart .    CB@   [phone]

## 2022-11-09 ENCOUNTER — Encounter: Payer: Self-pay | Admitting: Nurse Practitioner

## 2022-11-21 ENCOUNTER — Other Ambulatory Visit: Payer: Self-pay | Admitting: Nurse Practitioner

## 2022-11-23 NOTE — Telephone Encounter (Signed)
Requested medication (s) are due for refill today: routing for review  Requested medication (s) are on the active medication list: yes  Last refill:  05/21/22  Future visit scheduled: yes  Notes to clinic:  Unable to refill per protocol, cannot delegate.      Requested Prescriptions  Pending Prescriptions Disp Refills   midodrine (PROAMATINE) 10 MG tablet [Pharmacy Med Name: MIDODRINE HCL 10 MG TABLET] 90 tablet 1    Sig: Take 0.5-1 tablets (5-10 mg total) by mouth daily as needed.     Not Delegated - Cardiovascular: Midodrine Failed - 11/21/2022  9:40 AM      Failed - This refill cannot be delegated      Failed - Cr in normal range and within 360 days    Creat  Date Value Ref Range Status  12/27/2017 7.43 (H) 0.50 - 1.05 mg/dL Final    Comment:    For patients >44 years of age, the reference limit for Creatinine is approximately 13% higher for people identified as African-American. .    Creatinine, Ser  Date Value Ref Range Status  09/10/2022 6.93 (H) 0.57 - 1.00 mg/dL Final         Failed - Last BP in normal range    BP Readings from Last 1 Encounters:  09/22/22 (!) 151/75         Passed - ALT in normal range and within 360 days    ALT  Date Value Ref Range Status  09/10/2022 15 0 - 32 IU/L Final   SGPT (ALT)  Date Value Ref Range Status  10/15/2014 54 U/L Final    Comment:    14-63 NOTE: New Reference Range 07/10/14    ALT (SGPT) P5P  Date Value Ref Range Status  07/11/2019 28 0 - 40 IU/L Final         Passed - AST in normal range and within 360 days    AST  Date Value Ref Range Status  09/10/2022 19 0 - 40 IU/L Final   SGOT(AST)  Date Value Ref Range Status  10/15/2014 46 (H) 15 - 37 Unit/L Final         Passed - Valid encounter within last 12 months    Recent Outpatient Visits           2 months ago Diabetic polyneuropathy associated with type 2 diabetes mellitus ([place])   [name], Jolene T, NP   7 months ago  Hypertension associated with diabetes ([place])   [place], Jolene T, NP   8 months ago Type 2 diabetes mellitus with ESRD (end-stage renal disease) ([place])   [place], Jolene T, NP   1 year ago Type 2 diabetes mellitus with ESRD (end-stage renal disease) ([place])   [place], Jolene T, NP   1 year ago Type 2 diabetes mellitus with ESRD (end-stage renal disease) ([place])   [facility], [name], DO       Future Appointments             In 2 weeks [place], Barbaraann Faster, NP [employer]

## 2022-12-08 ENCOUNTER — Ambulatory Visit: Payer: 59 | Admitting: Nurse Practitioner

## 2022-12-17 ENCOUNTER — Ambulatory Visit
Payer: 59 | Attending: Student in an Organized Health Care Education/Training Program | Admitting: Student in an Organized Health Care Education/Training Program

## 2022-12-17 ENCOUNTER — Encounter: Payer: Self-pay | Admitting: Student in an Organized Health Care Education/Training Program

## 2022-12-17 VITALS — BP 173/66 | HR 67 | Temp 97.1°F | Resp 16 | Ht 66.0 in | Wt 230.0 lb

## 2022-12-17 DIAGNOSIS — M5416 Radiculopathy, lumbar region: Secondary | ICD-10-CM | POA: Insufficient documentation

## 2022-12-17 DIAGNOSIS — E1142 Type 2 diabetes mellitus with diabetic polyneuropathy: Secondary | ICD-10-CM | POA: Insufficient documentation

## 2022-12-17 DIAGNOSIS — G894 Chronic pain syndrome: Secondary | ICD-10-CM | POA: Diagnosis present

## 2022-12-17 DIAGNOSIS — M47816 Spondylosis without myelopathy or radiculopathy, lumbar region: Secondary | ICD-10-CM | POA: Insufficient documentation

## 2022-12-17 DIAGNOSIS — M5412 Radiculopathy, cervical region: Secondary | ICD-10-CM | POA: Diagnosis not present

## 2022-12-17 MED ORDER — OXYCODONE HCL 5 MG PO TABS: 5.0000 mg | ORAL_TABLET | Freq: Three times a day (TID) | ORAL | 0 refills | Status: DC | PRN

## 2022-12-17 NOTE — Progress Notes (Signed)
Nursing Pain Medication Assessment:  Safety precautions to be maintained throughout the outpatient stay will include: orient to surroundings, keep bed in low position, maintain call bell within reach at all times, provide assistance with transfer out of bed and ambulation.  Medication Inspection Compliance: Pill count conducted under aseptic conditions, in front of the patient. Neither the pills nor the bottle was removed from the patient's sight at any time. Once count was completed pills were immediately returned to the patient in their original bottle.  Medication: Oxycodone IR Pill/Patch Count:  17 of 75 pills remain Pill/Patch Appearance: Markings consistent with prescribed medication Bottle Appearance: Standard pharmacy container. Clearly labeled. Filled Date: 61 / 03 / 2023 Last Medication intake:  YesterdaySafety precautions to be maintained throughout the outpatient stay will include: orient to surroundings, keep bed in low position, maintain call bell within reach at all times, provide assistance with transfer out of bed and ambulation.

## 2022-12-17 NOTE — Progress Notes (Signed)
PROVIDER NOTE: Information contained herein reflects review and annotations entered in association with encounter. Interpretation of such information and data should be left to medically-trained personnel. Information provided to patient can be located elsewhere in the medical record under "Patient Instructions". Document created using STT-dictation technology, any transcriptional errors that may result from process are unintentional.    Patient: [name]  Service Category: E/M  Provider: Gillis Santa, MD  DOB: 1966-06-15  DOS: 12/17/2022  Specialty: Interventional Pain Management  MRN: [id number]  Setting: Ambulatory outpatient  PCP: Olivia Lick, NP  Type: Established Patient    Referring Provider: Venita Lick, NP  Location: Office  Delivery: Face-to-face     HPI  [name], a 56 y.o. year old female, is here today because of her Diabetic polyneuropathy associated with type 2 diabetes mellitus ([place]) [E11.42]. [name] primary complain today is Neck Pain and Back Pain (lower) Last encounter: My last encounter with her was on 09/22/22 Pertinent problems: [name] has ESRD (end stage renal disease) on dialysis [facility]); Type 2 diabetes mellitus with ESRD (end-stage renal disease) ([place]); Chronic bilateral low back pain; DDD (degenerative disc disease), cervical; Spondylosis of cervical region without myelopathy or radiculopathy; PAD (peripheral artery disease) ([place]); Chronic pain syndrome; Diabetic polyneuropathy associated with type 2 diabetes mellitus ([place]); Cervical radiculopathy; Lumbar radiculopathy; GAD (generalized anxiety disorder); and Recurrent major depression in partial remission (HCC) on their pertinent problem list. Pain Assessment: Severity of Chronic pain is reported as a 8 /10. Location: Back Lower/denies. Onset: More than a month ago. Quality: Dull. Timing: Constant. Modifying factor(s): Oxycodone. Vitals:  height is _0  (1.676 m) and weight is 230 lb (104.3 kg).  Her temporal temperature is 97.1 F (36.2 C) (abnormal). Her blood pressure is 173/66 (abnormal) and her pulse is 67. Her respiration is 16 and oxygen saturation is 99%.   Reason for encounter: medication management.   No change in medical history since last visit.  Endorsing more paraesthesias and swelling of bilateral hand. Has repeat A1c check end of January.  Patient continues multimodal pain regimen as prescribed.  States that it provides pain relief and improvement in functional status. Right eye doing better after surgery, she hopes to start walking more outside Continuing Orencia infusions with RA, unfortunately she was not scheduled for a December infusion, next infusion will be 12/24/21.    Pharmacotherapy Assessment  Analgesic: Oxycodone 5 mg q8 hrs prn quantity 75/month; MME equals 22.5    Monitoring: [place] PMP: PDMP reviewed during this encounter.       Pharmacotherapy: No side-effects or adverse reactions reported. Compliance: No problems identified. Effectiveness: Clinically acceptable.  Landis Martins, RN  12/17/2022  9:39 AM  Sign when Signing Visit Nursing Pain Medication Assessment:  Safety precautions to be maintained throughout the outpatient stay will include: orient to surroundings, keep bed in low position, maintain call bell within reach at all times, provide assistance with transfer out of bed and ambulation.  Medication Inspection Compliance: Pill count conducted under aseptic conditions, in front of the patient. Neither the pills nor the bottle was removed from the patient's sight at any time. Once count was completed pills were immediately returned to the patient in their original bottle.  Medication: Oxycodone IR Pill/Patch Count:  17 of 75 pills remain Pill/Patch Appearance: Markings consistent with prescribed medication Bottle Appearance: Standard pharmacy container. Clearly labeled. Filled Date: 80 / 03 / 2023 Last Medication intake:  YesterdaySafety  precautions to be maintained throughout  the outpatient stay will include: orient to surroundings, keep bed in low position, maintain call bell within reach at all times, provide assistance with transfer out of bed and ambulation.      UDS:  No results found for: "SUMMARY"   ROS  Constitutional: Denies any fever or chills Gastrointestinal: No reported hemesis, hematochezia, vomiting, or acute GI distress Musculoskeletal:  bilateral hand pain, parasthesias, and swelling Neurological: No reported episodes of acute onset apraxia, aphasia, dysarthria, agnosia, amnesia, paralysis, loss of coordination, or loss of consciousness  Medication Review  Abatacept, FQ Protective Underwear, INSULIN SYRINGE .5CC/29G, Insulin Syringe-Needle U-100, Vitamin D (Ergocalciferol), brimonidine, diltiazem, dorzolamidel-timolol, glucose blood, insulin aspart, insulin glargine, latanoprost, leflunomide, levocetirizine, midodrine, ofloxacin, omeprazole, oxyCODONE, and promethazine  History Review  Allergy: [name] is allergic to cinnamon, garlic, onion, tylenol [acetaminophen], losartan potassium, amlodipine, ciprofloxacin, clonidine derivatives, ginger, hydralazine, lisinopril, metoprolol, prednisone, and statins. Drug: [name]  reports no history of drug use. Alcohol:  reports no history of alcohol use. Tobacco:  reports that she quit smoking about 16 months ago. Her smoking use included cigarettes. She has a 5.00 pack-year smoking history. She has never used smokeless tobacco. Social: [name]  reports that she quit smoking about 16 months ago. Her smoking use included cigarettes. She has a 5.00 pack-year smoking history. She has never used smokeless tobacco. She reports that she does not drink alcohol and does not use drugs. Medical:  has a past medical history of Allergy, Anemia, Anxiety, Arthritis, Ataxia, Chest pain, COPD (chronic obstructive pulmonary disease) ([place]), Depression, Diabetes mellitus with  complication ([place]), Diastolic dysfunction, ESRD on hemodialysis ([place]), Fistula, Hepatitis (2003), Hiatal hernia, Hypercholesterolemia, Hypertension, Migraine, Mitral regurgitation, Neuropathy, diabetic ([place]), Non-alcoholic cirrhosis ([place]), Palpitations, Peripheral vascular disease ([place]), Pneumonia (2015), Psoriasis, Tobacco dependence, and Wears dentures. Surgical: Ms. Dobosz  has a past surgical history that includes Tonsillectomy; rt. tubal and ovary removed; Cholecystectomy; Cardiac catheterization (N/A, 07/25/2015); Cardiac catheterization (Left, 07/25/2015); Cardiac catheterization (Left, 10/07/2015); Cardiac catheterization (N/A, 10/07/2015); Cardiac catheterization (10/07/2015); Cardiac catheterization (N/A, 12/17/2015); Incision and drainage abscess (N/A, 07/16/2016); cyst removed  from left hand (Left, 1989); AV fistula placement (Left, 11/2014); Cardiac catheterization (N/A, 01/11/2017); Cardiac catheterization (N/A, 01/11/2017); DIALYSIS/PERMA CATHETER INSERTION (N/A, 05/20/2017); Cesarean section; Colonoscopy (N/A, 09/22/2017); Esophagogastroduodenoscopy (N/A, 09/22/2017); polypectomy (09/22/2017); A/V SHUNT INTERVENTION (N/A, 12/16/2017); STENT PLACEMENT VASCULAR ([facility] HX) (Left, 03/2018); Upper Extremity Angiography (Left, 09/29/2019); Aqueous shunt (Left, 12/20/2019); Colonoscopy with propofol (N/A, 04/16/2020); A/V Fistulagram (Left, 04/23/2020); Esophagogastroduodenoscopy (egd) with propofol (N/A, 12/04/2021); and Eye surgery (Right, 09/02/2022). Family: family history includes Heart disease in her mother; Hypertension in her father and mother; Skin cancer in her father.  Laboratory Chemistry Profile   Renal Lab Results  Component Value Date   BUN 56 (H) 09/10/2022   CREATININE 6.93 (H) 09/10/2022   BCR 8 (L) 09/10/2022   GFRAA 6 (L) 05/01/2019   GFRNONAA 6 (L) 05/01/2019    Hepatic Lab Results  Component Value Date   AST 19 09/10/2022   ALT 15 09/10/2022   ALBUMIN 4.3 09/10/2022    ALKPHOS 186 (H) 09/10/2022   HCVAB >11.0 (H) 12/16/2016   LIPASE 21 04/30/2019    Electrolytes Lab Results  Component Value Date   NA 139 09/10/2022   K 6.3 (H) 09/10/2022   CL 96 09/10/2022   CALCIUM 7.4 (L) 09/10/2022   MG 1.9 09/10/2022   PHOS 4.2 05/01/2019    Bone Lab Results  Component Value Date   VD25OH 13.5 (L) 09/10/2022    Inflammation (CRP:  Acute Phase) (ESR: Chronic Phase) Lab Results  Component Value Date   CRP <0.8 12/16/2016   ESRSEDRATE 117 (H) 10/12/2017   LATICACIDVEN 1.6 04/30/2019         Note: Above Lab results reviewed.  Recent Imaging Review  ECHOCARDIOGRAM COMPLETE    ECHOCARDIOGRAM REPORT       Patient Name:   SHAQUISHA WYNN Date of Exam: 06/04/2022 Medical Rec #:  [id number]     Height:       66.5 in Accession #:    [id number]    Weight:       233.0 lb Date of Birth:  Dec 02, 1966     BSA:          2.146 m Patient Age:    33 years      BP:           179/77 mmHg Patient Gender: F             HR:           66 bpm. Exam Location:  [place]  Procedure: 2D Echo, Cardiac Doppler, Color Doppler and Strain Analysis  Indications:    94.39, Abnormal stress test   History:        Patient has prior history of Echocardiogram examinations, most                 recent 06/09/2018. PAD, Mitral Valve Disease; Risk                 Factors:Hypertension, Diabetes and Former Smoker.   Sonographer:    Pilar Jarvis RDMS, RVT, RDCS Referring Phys: [id number] CHRISTOPHER RONALD BERGE    Sonographer Comments: Poor [facility] tracking IMPRESSIONS   1. Left ventricular ejection fraction, by estimation, is 60 to 65%. The left ventricle has normal function. The left ventricle has no regional wall motion abnormalities. There is mild left ventricular hypertrophy. Left ventricular diastolic parameters  are consistent with Grade I diastolic dysfunction (impaired relaxation). The average left ventricular global longitudinal strain is -17.3 %. The global longitudinal strain is  normal.  2. Right ventricular systolic function is normal. The right ventricular size is normal.  3. Left atrial size was mild to moderately dilated.  4. The mitral valve is normal in structure. Mild to moderate mitral valve regurgitation.  5. The aortic valve was not well visualized. Aortic valve regurgitation is mild. Aortic valve sclerosis is present, with no evidence of aortic valve stenosis.  6. The inferior vena cava is normal in size with greater than 50% respiratory variability, suggesting right atrial pressure of 3 mmHg.  FINDINGS  Left Ventricle: Left ventricular ejection fraction, by estimation, is 60 to 65%. The left ventricle has normal function. The left ventricle has no regional wall motion abnormalities. The average left ventricular global longitudinal strain is -17.3 %.  The global longitudinal strain is normal. 3D left ventricular ejection fraction analysis performed but not reported based on interpreter judgement due to suboptimal tracking. The left ventricular internal cavity size was normal in size. There is mild  left ventricular hypertrophy. Left ventricular diastolic parameters are consistent with Grade I diastolic dysfunction (impaired relaxation).  Right Ventricle: The right ventricular size is normal. No increase in right ventricular wall thickness. Right ventricular systolic function is normal.  Left Atrium: Left atrial size was mild to moderately dilated.  Right Atrium: Right atrial size was normal in size.  Pericardium: There is no evidence of pericardial effusion.  Mitral Valve: The mitral valve is normal in  structure. Mild to moderate mitral valve regurgitation.  Tricuspid Valve: The tricuspid valve is normal in structure. Tricuspid valve regurgitation is mild.  Aortic Valve: The aortic valve was not well visualized. Aortic valve regurgitation is mild. Aortic valve sclerosis is present, with no evidence of aortic valve stenosis. Aortic valve mean gradient  measures 4.0 mmHg. Aortic valve peak gradient measures  7.4 mmHg. Aortic valve area, by VTI measures 2.67 cm.  Pulmonic Valve: The pulmonic valve was not well visualized. Pulmonic valve regurgitation is trivial.  Aorta: The aortic root and ascending aorta are structurally normal, with no evidence of dilitation.  Venous: The inferior vena cava is normal in size with greater than 50% respiratory variability, suggesting right atrial pressure of 3 mmHg.  IAS/Shunts: No atrial level shunt detected by color flow Doppler.    LEFT VENTRICLE PLAX 2D LVIDd:         5.10 cm      Diastology LVIDs:         3.40 cm      LV e' medial:    5.44 cm/s LV PW:         1.10 cm      LV E/e' medial:  18.3 LV IVS:        1.10 cm      LV e' lateral:   7.29 cm/s LVOT diam:     2.10 cm      LV E/e' lateral: 13.7 LV SV:         94 LV SV Index:   44           2D Longitudinal Strain LVOT Area:     3.46 cm     2D Strain GLS Avg:     -17.3 %   LV Volumes (MOD) LV vol d, MOD A2C: 158.0 ml LV vol d, MOD A4C: 138.0 ml LV vol s, MOD A2C: 72.4 ml LV vol s, MOD A4C: 70.1 ml LV SV MOD A2C:     85.6 ml LV SV MOD A4C:     138.0 ml LV SV MOD BP:      75.5 ml  RIGHT VENTRICLE             IVC RV Basal diam:  3.50 cm     IVC diam: 1.50 cm RV S prime:     11.40 cm/s TAPSE (M-mode): 2.6 cm  LEFT ATRIUM             Index        RIGHT ATRIUM           Index LA diam:        4.70 cm 2.19 cm/m   RA Area:     17.80 cm LA Vol (A2C):   95.3 ml 44.41 ml/m  RA Volume:   45.30 ml  21.11 ml/m LA Vol (A4C):   82.6 ml 38.49 ml/m LA Biplane Vol: 91.7 ml 42.73 ml/m  AORTIC VALVE AV Area (Vmax):    2.62 cm AV Area (Vmean):   2.57 cm AV Area (VTI):     2.67 cm AV Vmax:           136.00 cm/s AV Vmean:          99.600 cm/s AV VTI:            0.350 m AV Peak Grad:      7.4 mmHg AV Mean Grad:      4.0 mmHg LVOT Vmax:  103.00 cm/s LVOT Vmean:        73.900 cm/s LVOT VTI:          0.270 m LVOT/AV VTI ratio: 0.77    AORTA Ao Root diam: 3.30 cm Ao Asc diam:  3.50 cm Ao Arch diam: 3.0 cm  MITRAL VALVE                TRICUSPID VALVE MV Area (PHT): 3.63 cm     TR Peak grad:   37.0 mmHg MV Decel Time: 209 msec     TR Vmax:        304.00 cm/s MV E velocity: 99.80 cm/s MV A velocity: 121.00 cm/s  SHUNTS MV E/A ratio:  0.82         Systemic VTI:  0.27 m                             Systemic Diam: 2.10 cm  Kate Sable MD Electronically signed by Kate Sable MD Signature Date/Time: 06/04/2022/5:01:57 PM      Final   Note: Reviewed        Physical Exam  General appearance: Well nourished, well developed, and well hydrated. In no apparent acute distress Mental status: Alert, oriented x 3 (person, place, & time)       Respiratory: No evidence of acute respiratory distress Eyes: PERLA Vitals: BP (!) 173/66   Pulse 67   Temp (!) 97.1 F (36.2 C) (Temporal)   Resp 16   Ht _0  (1.676 m)   Wt 230 lb (104.3 kg)   SpO2 99%   BMI 37.12 kg/m  BMI: Estimated body mass index is 37.12 kg/m as calculated from the following:   Height as of this encounter: _1  (1.676 m).   Weight as of this encounter: 230 lb (104.3 kg). Ideal: Ideal body weight: 59.3 kg (130 lb 11.7 oz) Adjusted ideal body weight: 77.3 kg (170 lb 7 oz)  Cervical Spine Area Exam  Skin & Axial Inspection: No masses, redness, edema, swelling, or associated skin lesions Alignment: Symmetrical Functional ROM: Decreased ROM, to the right Stability: No instability detected Muscle Tone/Strength: Functionally intact. No obvious neuro-muscular anomalies detected. Sensory (Neurological): Dermatomal pain pattern RIGHT C 5,6 Palpation: No palpable anomalies                   Upper Extremity (UE) Exam      Side: Right upper extremity   Side: Left upper extremity   Skin & Extremity Inspection: swelling of hands   Skin & Extremity Inspection: Skin color, temperature, and hair growth are WNL. No peripheral edema or cyanosis. No  masses, redness, swelling, asymmetry, or associated skin lesions. No contractures.   Functional ROM: Decreased ROM for all joints of upper extremity   Functional ROM: Unrestricted ROM           Muscle Tone/Strength: Functionally intact. No obvious neuro-muscular anomalies detected.   Muscle Tone/Strength: Functionally intact. No obvious neuro-muscular anomalies detected.   Sensory (Neurological): neurogenic pain of hand   Sensory (Neurological): neurogenic pain of hand   Palpation: No palpable anomalies               Palpation: No palpable anomalies               Provocative Test(s):  Phalen's test: deferred Tinel's test: deferred Apley's scratch test (touch opposite shoulder):  Action 1 (Across chest): Decreased ROM Action 2 (Overhead): Decreased ROM  Action 3 (LB reach): Decreased ROM     Provocative Test(s):  Phalen's test: deferred Tinel's test: deferred Apley's scratch test (touch opposite shoulder):  Action 1 (Across chest): deferred Action 2 (Overhead): deferred Action 3 (LB reach): deferred     4- out of 5 strength RIGHTupper extremity: Shoulder abduction, elbow flexion, elbow extension, thumb extension.  Thoracic Spine Area Exam  Skin & Axial Inspection: No masses, redness, or swelling Alignment: Symmetrical Functional ROM: Unrestricted ROM Stability: No instability detected Muscle Tone/Strength: Functionally intact. No obvious neuro-muscular anomalies detected. Sensory (Neurological): Unimpaired Muscle strength & Tone: No palpable anomalies  Assessment   Diagnosis   1. Diabetic polyneuropathy associated with type 2 diabetes mellitus (HCC)   2. Cervical radiculopathy   3. Lumbar spondylosis   4. Lumbar radiculopathy   5. Chronic pain syndrome         Plan of Care    [name] has a current medication list which includes the following long-term medication(s): orencia, diltiazem, insulin glargine, leflunomide, levocetirizine, novolog, omeprazole, oxycodone,  promethazine, oxycodone, [START ON 01/21/2023] oxycodone, and [START ON 02/20/2023] oxycodone.   1. Diabetic polyneuropathy associated with type 2 diabetes mellitus (HCC) - oxyCODONE (OXY IR/ROXICODONE) 5 MG immediate release tablet; Take 1 tablet (5 mg total) by mouth every 8 (eight) hours as needed for severe pain. Must last 30 days.  Dispense: 75 tablet; Refill: 0 - oxyCODONE (OXY IR/ROXICODONE) 5 MG immediate release tablet; Take 1 tablet (5 mg total) by mouth every 8 (eight) hours as needed for severe pain. Must last 30 days.  Dispense: 75 tablet; Refill: 0  2. Cervical radiculopathy - oxyCODONE (OXY IR/ROXICODONE) 5 MG immediate release tablet; Take 1 tablet (5 mg total) by mouth every 8 (eight) hours as needed for severe pain. Must last 30 days.  Dispense: 75 tablet; Refill: 0 - oxyCODONE (OXY IR/ROXICODONE) 5 MG immediate release tablet; Take 1 tablet (5 mg total) by mouth every 8 (eight) hours as needed for severe pain. Must last 30 days.  Dispense: 75 tablet; Refill: 0  3. Lumbar spondylosis  4. Lumbar radiculopathy  5. Chronic pain syndrome - oxyCODONE (OXY IR/ROXICODONE) 5 MG immediate release tablet; Take 1 tablet (5 mg total) by mouth every 8 (eight) hours as needed for severe pain. Must last 30 days.  Dispense: 75 tablet; Refill: 0 - oxyCODONE (OXY IR/ROXICODONE) 5 MG immediate release tablet; Take 1 tablet (5 mg total) by mouth every 8 (eight) hours as needed for severe pain. Must last 30 days.  Dispense: 75 tablet; Refill: 0    -Discussed exercise and encouraged her to walk more. -Repeat cervical ESI as needed cervical radicular pain flare.    Pharmacotherapy (Medications Ordered): Meds ordered this encounter  Medications   oxyCODONE (OXY IR/ROXICODONE) 5 MG immediate release tablet    Sig: Take 1 tablet (5 mg total) by mouth every 8 (eight) hours as needed for severe pain. Must last 30 days.    Dispense:  75 tablet    Refill:  0    Chronic Pain. (STOP Act - Not  applicable). Fill one day early if closed on scheduled refill date.   oxyCODONE (OXY IR/ROXICODONE) 5 MG immediate release tablet    Sig: Take 1 tablet (5 mg total) by mouth every 8 (eight) hours as needed for severe pain. Must last 30 days.    Dispense:  75 tablet    Refill:  0    Chronic Pain. (STOP Act - Not applicable). Fill one day early if  closed on scheduled refill date.   Orders:  No orders of the defined types were placed in this encounter.  Follow-up plan:   Return in about 3 months (around 03/18/2023) for Medication Management, in person.     Recent Visits Date Type Provider Dept  09/22/22 Office Visit Olivia Santa, MD [facility]  Showing recent visits within past 90 days and meeting all other requirements Today's Visits Date Type Provider Dept  12/17/22 Office Visit Olivia Santa, MD [facility]  Showing today's visits and meeting all other requirements Future Appointments No visits were found meeting these conditions. Showing future appointments within next 90 days and meeting all other requirements  I discussed the assessment and treatment plan with the patient. The patient was provided an opportunity to ask questions and all were answered. The patient agreed with the plan and demonstrated an understanding of the instructions.  Patient advised to call back or seek an in-person evaluation if the symptoms or condition worsens.  Duration of encounter: 62mnutes.  Note by: BGillis Santa MD Date: 12/17/2022; Time: 10:08 AM

## 2023-01-13 ENCOUNTER — Other Ambulatory Visit: Payer: Self-pay | Admitting: Nurse Practitioner

## 2023-01-13 NOTE — Telephone Encounter (Signed)
Requested medication (s) are due for refill today - yes  Requested medication (s) are on the active medication list -yes  Future visit scheduled -no  Last refill: 09/22/22 #100 3RF  Notes to clinic: diabetic supplies- no protocol listed   Requested Prescriptions  Pending Prescriptions Disp Refills   BD INSULIN SYRINGE U/F 30G X 1/2" 0.5 ML MISC [Pharmacy Med Name: BD INS SYR UF 0.5ML 12.7MMX30G] 100 each 3    Sig: USE 4 TIMES DAILY WITH LANTUS & NOVOLOG     There is no refill protocol information for this order       Requested Prescriptions  Pending Prescriptions Disp Refills   BD INSULIN SYRINGE U/F 30G X 1/2" 0.5 ML MISC [Pharmacy Med Name: BD INS SYR UF 0.5ML 12.7MMX30G] 100 each 3    Sig: USE 4 TIMES DAILY WITH LANTUS & NOVOLOG     There is no refill protocol information for this order

## 2023-03-16 LAB — HM DIABETES EYE EXAM

## 2023-03-17 ENCOUNTER — Encounter: Payer: Self-pay | Admitting: Nurse Practitioner

## 2023-03-18 ENCOUNTER — Ambulatory Visit
Payer: 59 | Attending: Student in an Organized Health Care Education/Training Program | Admitting: Student in an Organized Health Care Education/Training Program

## 2023-03-18 ENCOUNTER — Encounter: Payer: Self-pay | Admitting: Student in an Organized Health Care Education/Training Program

## 2023-03-18 VITALS — BP 146/62 | HR 64 | Temp 97.1°F | Ht 66.0 in | Wt 226.0 lb

## 2023-03-18 DIAGNOSIS — M5416 Radiculopathy, lumbar region: Secondary | ICD-10-CM | POA: Diagnosis present

## 2023-03-18 DIAGNOSIS — M47816 Spondylosis without myelopathy or radiculopathy, lumbar region: Secondary | ICD-10-CM

## 2023-03-18 DIAGNOSIS — M5412 Radiculopathy, cervical region: Secondary | ICD-10-CM | POA: Diagnosis present

## 2023-03-18 DIAGNOSIS — G894 Chronic pain syndrome: Secondary | ICD-10-CM

## 2023-03-18 DIAGNOSIS — E1142 Type 2 diabetes mellitus with diabetic polyneuropathy: Secondary | ICD-10-CM | POA: Diagnosis present

## 2023-03-18 MED ORDER — OXYCODONE HCL 5 MG PO TABS: 5.0000 mg | ORAL_TABLET | Freq: Three times a day (TID) | ORAL | 0 refills | Status: DC | PRN

## 2023-03-18 NOTE — Progress Notes (Signed)
PROVIDER NOTE: Information contained herein reflects review and annotations entered in association with encounter. Interpretation of such information and data should be left to medically-trained personnel. Information provided to patient can be located elsewhere in the medical record under "Patient Instructions". Document created using STT-dictation technology, any transcriptional errors that may result from process are unintentional.    Patient: [name]  Service Category: E/M  Provider: Gillis Santa, MD  DOB: January 27, 1966  DOS: 03/18/2023  Specialty: Interventional Pain Management  MRN: [id number]  Setting: Ambulatory outpatient  PCP: Venita Lick, NP  Type: Established Patient    Referring Provider: Venita Lick, NP  Location: Office  Delivery: Face-to-face     HPI  [name], a 57 y.o. year old female, is here today because of her Diabetic polyneuropathy associated with type 2 diabetes mellitus ([place]) [E11.42]. [name] primary complain today is Back Pain (lower) Last encounter: My last encounter with her was on 12/17/22 Pertinent problems: [name] has ESRD (end stage renal disease) on dialysis [facility]); Type 2 diabetes mellitus with ESRD (end-stage renal disease) ([place]); Chronic bilateral low back pain; DDD (degenerative disc disease), cervical; Spondylosis of cervical region without myelopathy or radiculopathy; PAD (peripheral artery disease) ([place]); Chronic pain syndrome; Diabetic polyneuropathy associated with type 2 diabetes mellitus ([place]); Cervical radiculopathy; Lumbar radiculopathy; GAD (generalized anxiety disorder); and Recurrent major depression in partial remission (HCC) on their pertinent problem list. Pain Assessment: Severity of Chronic pain is reported as a 8 /10. Location: Back Lower/denies. Onset: More than a month ago. Quality: Constant, Burning, Dull, Stabbing. Timing: Constant. Modifying factor(s): heat. Vitals:  height is 5\' 6"  (1.676 m) and weight is 226 lb  (102.5 kg). Her temporal temperature is 97.1 F (36.2 C) (abnormal). Her blood pressure is 146/62 (abnormal) and her pulse is 64. Her oxygen saturation is 100%.   Reason for encounter: medication management.   Increased low back pain with intermittent radiation to hips and SI joint area Continues on dialysis Continues with RA management with Dr Posey Pronto- states Orencia infusions are not very helpful Obtain serum tox panel today for compliance and monitoring of Oxycodone    Pharmacotherapy Assessment  Analgesic: Oxycodone 5 mg q8 hrs prn quantity 75/month; MME equals 22.5    Monitoring: [place] PMP: PDMP reviewed during this encounter.       Pharmacotherapy: No side-effects or adverse reactions reported. Compliance: No problems identified. Effectiveness: Clinically acceptable.  Arlice Colt, RN  03/18/2023  8:08 AM  Sign when Signing Visit Nursing Pain Medication Assessment:  Safety precautions to be maintained throughout the outpatient stay will include: orient to surroundings, keep bed in low position, maintain call bell within reach at all times, provide assistance with transfer out of bed and ambulation.  Medication Inspection Compliance: Pill count conducted under aseptic conditions, in front of the patient. Neither the pills nor the bottle was removed from the patient's sight at any time. Once count was completed pills were immediately returned to the patient in their original bottle.  Medication: Oxycodone IR Pill/Patch Count:  23 of 75 pills remain Pill/Patch Appearance: Markings consistent with prescribed medication Bottle Appearance: Standard pharmacy container. Clearly labeled. Filled Date: 03 / 05 / 2024 Last Medication intake:  YesterdaySafety precautions to be maintained throughout the outpatient stay will include: orient to surroundings, keep bed in low position, maintain call bell within reach at all times, provide assistance with transfer out of bed and ambulation.       UDS:  No results  found for: "SUMMARY"   ROS  Constitutional: Denies any fever or chills Gastrointestinal: No reported hemesis, hematochezia, vomiting, or acute GI distress Musculoskeletal:  low back pain, bilateral hand pain, parasthesias, and swelling Neurological: No reported episodes of acute onset apraxia, aphasia, dysarthria, agnosia, amnesia, paralysis, loss of coordination, or loss of consciousness  Medication Review  Abatacept, FQ Protective Underwear, Insulin Syringe-Needle U-100, Vitamin D (Ergocalciferol), brimonidine, carvedilol, diltiazem, dorzolamidel-timolol, glucose blood, insulin aspart, insulin glargine, latanoprost, leflunomide, levocetirizine, lidocaine-prilocaine, midodrine, ofloxacin, omeprazole, oxyCODONE, and promethazine  History Review  Allergy: [name] is allergic to cinnamon, garlic, onion, tylenol [acetaminophen], losartan potassium, amlodipine, ciprofloxacin, clonidine derivatives, ginger, hydralazine, lisinopril, metoprolol, prednisone, and statins. Drug: [name]  reports no history of drug use. Alcohol:  reports no history of alcohol use. Tobacco:  reports that she quit smoking about 19 months ago. Her smoking use included cigarettes. She has a 5.00 pack-year smoking history. She has never used smokeless tobacco. Social: [name]  reports that she quit smoking about 19 months ago. Her smoking use included cigarettes. She has a 5.00 pack-year smoking history. She has never used smokeless tobacco. She reports that she does not drink alcohol and does not use drugs. Medical:  has a past medical history of Allergy, Anemia, Anxiety, Arthritis, Ataxia, Chest pain, COPD (chronic obstructive pulmonary disease) ([place]), Depression, Diabetes mellitus with complication ([place]), Diastolic dysfunction, ESRD on hemodialysis ([place]), Fistula, Hepatitis (2003), Hiatal hernia, Hypercholesterolemia, Hypertension, Migraine, Mitral regurgitation, Neuropathy, diabetic ([place]), Non-alcoholic  cirrhosis ([place]), Palpitations, Peripheral vascular disease ([place]), Pneumonia (2015), Psoriasis, Tobacco dependence, and Wears dentures. Surgical: Ms. Shor  has a past surgical history that includes Tonsillectomy; rt. tubal and ovary removed; Cholecystectomy; Cardiac catheterization (N/A, 07/25/2015); Cardiac catheterization (Left, 07/25/2015); Cardiac catheterization (Left, 10/07/2015); Cardiac catheterization (N/A, 10/07/2015); Cardiac catheterization (10/07/2015); Cardiac catheterization (N/A, 12/17/2015); Incision and drainage abscess (N/A, 07/16/2016); cyst removed  from left hand (Left, 1989); AV fistula placement (Left, 11/2014); Cardiac catheterization (N/A, 01/11/2017); Cardiac catheterization (N/A, 01/11/2017); DIALYSIS/PERMA CATHETER INSERTION (N/A, 05/20/2017); Cesarean section; Colonoscopy (N/A, 09/22/2017); Esophagogastroduodenoscopy (N/A, 09/22/2017); polypectomy (09/22/2017); A/V SHUNT INTERVENTION (N/A, 12/16/2017); STENT PLACEMENT VASCULAR ([facility] H[facility]) (Left, 03/2018); Upper Extremity Angiography (Left, 09/29/2019); Aqueous shunt (Left, 12/20/2019); Colonoscopy with propofol (N/A, 04/16/2020); A/V Fistulagram (Left, 04/23/2020); Esophagogastroduodenoscopy (egd) with propofol (N/A, 12/04/2021); and Eye surgery (Right, 09/02/2022). Family: family history includes Heart disease in her mother; Hypertension in her father and mother; Skin cancer in her father.  Laboratory Chemistry Profile   Renal Lab Results  Component Value Date   BUN 56 (H) 09/10/2022   CREATININE 6.93 (H) 09/10/2022   BCR 8 (L) 09/10/2022   GFRAA 6 (L) 05/01/2019   GFRNONAA 6 (L) 05/01/2019    Hepatic Lab Results  Component Value Date   AST 19 09/10/2022   ALT 15 09/10/2022   ALBUMIN 4.3 09/10/2022   ALKPHOS 186 (H) 09/10/2022   HCVAB >11.0 (H) 12/16/2016   LIPASE 21 04/30/2019    Electrolytes Lab Results  Component Value Date   NA 139 09/10/2022   K 6.3 (H) 09/10/2022   CL 96 09/10/2022   CALCIUM 7.4 (L)  09/10/2022   MG 1.9 09/10/2022   PHOS 4.2 05/01/2019    Bone Lab Results  Component Value Date   VD25OH 13.5 (L) 09/10/2022    Inflammation (CRP: Acute Phase) (ESR: Chronic Phase) Lab Results  Component Value Date   CRP <0.8 12/16/2016   ESRSEDRATE 117 (H) 10/12/2017   LATICACIDVEN 1.6 04/30/2019         Note: Above  Lab results reviewed.  Recent Imaging Review  ECHOCARDIOGRAM COMPLETE    ECHOCARDIOGRAM REPORT       Patient Name:   ARMANDINA BOEREMA Date of Exam: 06/04/2022 Medical Rec #:  [id number]     Height:       66.5 in Accession #:    [id number]    Weight:       233.0 lb Date of Birth:  11/10/1966     BSA:          2.146 m Patient Age:    18 years      BP:           179/77 mmHg Patient Gender: F             HR:           66 bpm. Exam Location:  [facility]  Procedure: 2D Echo, Cardiac Doppler, Color Doppler and Strain Analysis  Indications:    94.39, Abnormal stress test   History:        Patient has prior history of Echocardiogram examinations, most                 recent 06/09/2018. PAD, Mitral Valve Disease; Risk                 Factors:Hypertension, Diabetes and Former Smoker.   Sonographer:    Pilar Jarvis RDMS, RVT, RDCS Referring Phys: [id number] CHRISTOPHER RONALD BERGE    Sonographer Comments: Poor [facility] tracking IMPRESSIONS   1. Left ventricular ejection fraction, by estimation, is 60 to 65%. The left ventricle has normal function. The left ventricle has no regional wall motion abnormalities. There is mild left ventricular hypertrophy. Left ventricular diastolic parameters  are consistent with Grade I diastolic dysfunction (impaired relaxation). The average left ventricular global longitudinal strain is -17.3 %. The global longitudinal strain is normal.  2. Right ventricular systolic function is normal. The right ventricular size is normal.  3. Left atrial size was mild to moderately dilated.  4. The mitral valve is normal in structure. Mild to moderate mitral valve  regurgitation.  5. The aortic valve was not well visualized. Aortic valve regurgitation is mild. Aortic valve sclerosis is present, with no evidence of aortic valve stenosis.  6. The inferior vena cava is normal in size with greater than 50% respiratory variability, suggesting right atrial pressure of 3 mmHg.  FINDINGS  Left Ventricle: Left ventricular ejection fraction, by estimation, is 60 to 65%. The left ventricle has normal function. The left ventricle has no regional wall motion abnormalities. The average left ventricular global longitudinal strain is -17.3 %.  The global longitudinal strain is normal. 3D left ventricular ejection fraction analysis performed but not reported based on interpreter judgement due to suboptimal tracking. The left ventricular internal cavity size was normal in size. There is mild  left ventricular hypertrophy. Left ventricular diastolic parameters are consistent with Grade I diastolic dysfunction (impaired relaxation).  Right Ventricle: The right ventricular size is normal. No increase in right ventricular wall thickness. Right ventricular systolic function is normal.  Left Atrium: Left atrial size was mild to moderately dilated.  Right Atrium: Right atrial size was normal in size.  Pericardium: There is no evidence of pericardial effusion.  Mitral Valve: The mitral valve is normal in structure. Mild to moderate mitral valve regurgitation.  Tricuspid Valve: The tricuspid valve is normal in structure. Tricuspid valve regurgitation is mild.  Aortic Valve: The aortic valve was not well visualized. Aortic valve regurgitation is mild.  Aortic valve sclerosis is present, with no evidence of aortic valve stenosis. Aortic valve mean gradient measures 4.0 mmHg. Aortic valve peak gradient measures  7.4 mmHg. Aortic valve area, by VTI measures 2.67 cm.  Pulmonic Valve: The pulmonic valve was not well visualized. Pulmonic valve regurgitation is trivial.  Aorta: The  aortic root and ascending aorta are structurally normal, with no evidence of dilitation.  Venous: The inferior vena cava is normal in size with greater than 50% respiratory variability, suggesting right atrial pressure of 3 mmHg.  IAS/Shunts: No atrial level shunt detected by color flow Doppler.    LEFT VENTRICLE PLAX 2D LVIDd:         5.10 cm      Diastology LVIDs:         3.40 cm      LV e' medial:    5.44 cm/s LV PW:         1.10 cm      LV E/e' medial:  18.3 LV IVS:        1.10 cm      LV e' lateral:   7.29 cm/s LVOT diam:     2.10 cm      LV E/e' lateral: 13.7 LV SV:         94 LV SV Index:   44           2D Longitudinal Strain LVOT Area:     3.46 cm     2D Strain GLS Avg:     -17.3 %   LV Volumes (MOD) LV vol d, MOD A2C: 158.0 ml LV vol d, MOD A4C: 138.0 ml LV vol s, MOD A2C: 72.4 ml LV vol s, MOD A4C: 70.1 ml LV SV MOD A2C:     85.6 ml LV SV MOD A4C:     138.0 ml LV SV MOD BP:      75.5 ml  RIGHT VENTRICLE             IVC RV Basal diam:  3.50 cm     IVC diam: 1.50 cm RV S prime:     11.40 cm/s TAPSE (M-mode): 2.6 cm  LEFT ATRIUM             Index        RIGHT ATRIUM           Index LA diam:        4.70 cm 2.19 cm/m   RA Area:     17.80 cm LA Vol (A2C):   95.3 ml 44.41 ml/m  RA Volume:   45.30 ml  21.11 ml/m LA Vol (A4C):   82.6 ml 38.49 ml/m LA Biplane Vol: 91.7 ml 42.73 ml/m  AORTIC VALVE AV Area (Vmax):    2.62 cm AV Area (Vmean):   2.57 cm AV Area (VTI):     2.67 cm AV Vmax:           136.00 cm/s AV Vmean:          99.600 cm/s AV VTI:            0.350 m AV Peak Grad:      7.4 mmHg AV Mean Grad:      4.0 mmHg LVOT Vmax:         103.00 cm/s LVOT Vmean:        73.900 cm/s LVOT VTI:          0.270 m LVOT/AV VTI ratio: 0.77   AORTA Ao Root diam: 3.30  cm Ao Asc diam:  3.50 cm Ao Arch diam: 3.0 cm  MITRAL VALVE                TRICUSPID VALVE MV Area (PHT): 3.63 cm     TR Peak grad:   37.0 mmHg MV Decel Time: 209 msec     TR Vmax:        304.00  cm/s MV E velocity: 99.80 cm/s MV A velocity: 121.00 cm/s  SHUNTS MV E/A ratio:  0.82         Systemic VTI:  0.27 m                             Systemic Diam: 2.10 cm  Kate Sable MD Electronically signed by Kate Sable MD Signature Date/Time: 06/04/2022/5:01:57 PM      Final   Note: Reviewed        Physical Exam  General appearance: Well nourished, well developed, and well hydrated. In no apparent acute distress Mental status: Alert, oriented x 3 (person, place, & time)       Respiratory: No evidence of acute respiratory distress Eyes: PERLA Vitals: BP (!) 146/62 (BP Location: Right Arm, Patient Position: Sitting, Cuff Size: Large)   Pulse 64   Temp (!) 97.1 F (36.2 C) (Temporal)   Ht 5\' 6"  (1.676 m)   Wt 226 lb (102.5 kg)   SpO2 100%   BMI 36.48 kg/m  BMI: Estimated body mass index is 36.48 kg/m as calculated from the following:   Height as of this encounter: 5\' 6"  (1.676 m).   Weight as of this encounter: 226 lb (102.5 kg). Ideal: Ideal body weight: 59.3 kg (130 lb 11.7 oz) Adjusted ideal body weight: 76.6 kg (168 lb 13.4 oz)  Cervical Spine Area Exam  Skin & Axial Inspection: No masses, redness, edema, swelling, or associated skin lesions Alignment: Symmetrical Functional ROM: Decreased ROM, to the right Stability: No instability detected Muscle Tone/Strength: Functionally intact. No obvious neuro-muscular anomalies detected. Sensory (Neurological): Dermatomal pain pattern RIGHT C 5,6 Palpation: No palpable anomalies               Thoracic Spine Area Exam  Skin & Axial Inspection: No masses, redness, or swelling Alignment: Symmetrical Functional ROM: Unrestricted ROM Stability: No instability detected Muscle Tone/Strength: Functionally intact. No obvious neuro-muscular anomalies detected. Sensory (Neurological): Unimpaired Muscle strength & Tone: No palpable anomalies  Lumbar Spine Area Exam  Skin & Axial Inspection: No masses, redness, or  swelling Alignment: Symmetrical Functional ROM: Pain restricted ROM       Stability: No instability detected Muscle Tone/Strength: Functionally intact. No obvious neuro-muscular anomalies detected. Sensory (Neurological): Musculoskeletal pain pattern  Gait & Posture Assessment  Ambulation: Patient ambulates using a cane Gait: Relatively normal for age and body habitus Posture: WNL  Lower Extremity Exam    Side: Right lower extremity  Side: Left lower extremity  Stability: No instability observed          Stability: No instability observed          Skin & Extremity Inspection: Skin color, temperature, and hair growth are WNL. No peripheral edema or cyanosis. No masses, redness, swelling, asymmetry, or associated skin lesions. No contractures.  Skin & Extremity Inspection: Skin color, temperature, and hair growth are WNL. No peripheral edema or cyanosis. No masses, redness, swelling, asymmetry, or associated skin lesions. No contractures.  Functional ROM: Unrestricted ROM  Functional ROM: Unrestricted ROM                  Muscle Tone/Strength: Functionally intact. No obvious neuro-muscular anomalies detected.  Muscle Tone/Strength: Functionally intact. No obvious neuro-muscular anomalies detected.  Sensory (Neurological): Neuropathic pain pattern        Sensory (Neurological): Neuropathic pain pattern        DTR: Patellar: deferred today Achilles: deferred today Plantar: deferred today  DTR: Patellar: deferred today Achilles: deferred today Plantar: deferred today  Palpation: No palpable anomalies  Palpation: No palpable anomalies    Assessment   Diagnosis   1. Diabetic polyneuropathy associated with type 2 diabetes mellitus (HCC)   2. Cervical radiculopathy   3. Lumbar spondylosis   4. Lumbar radiculopathy   5. Chronic pain syndrome          Plan of Care    [name] has a current medication list which includes the following long-term  medication(s): orencia, diltiazem, insulin glargine, leflunomide, levocetirizine, novolog, omeprazole, promethazine, [START ON 03/25/2023] oxycodone, [START ON 04/24/2023] oxycodone, and [START ON 05/24/2023] oxycodone.   1. Diabetic polyneuropathy associated with type 2 diabetes mellitus (HCC) - oxyCODONE (OXY IR/ROXICODONE) 5 MG immediate release tablet; Take 1 tablet (5 mg total) by mouth every 8 (eight) hours as needed for severe pain. Must last 30 days.  Dispense: 75 tablet; Refill: 0 - oxyCODONE (OXY IR/ROXICODONE) 5 MG immediate release tablet; Take 1 tablet (5 mg total) by mouth every 8 (eight) hours as needed for severe pain. Must last 30 days.  Dispense: 75 tablet; Refill: 0 - oxyCODONE (OXY IR/ROXICODONE) 5 MG immediate release tablet; Take 1 tablet (5 mg total) by mouth every 8 (eight) hours as needed for severe pain. Must last 30 days.  Dispense: 75 tablet; Refill: 0  2. Cervical radiculopathy - oxyCODONE (OXY IR/ROXICODONE) 5 MG immediate release tablet; Take 1 tablet (5 mg total) by mouth every 8 (eight) hours as needed for severe pain. Must last 30 days.  Dispense: 75 tablet; Refill: 0 - oxyCODONE (OXY IR/ROXICODONE) 5 MG immediate release tablet; Take 1 tablet (5 mg total) by mouth every 8 (eight) hours as needed for severe pain. Must last 30 days.  Dispense: 75 tablet; Refill: 0 - oxyCODONE (OXY IR/ROXICODONE) 5 MG immediate release tablet; Take 1 tablet (5 mg total) by mouth every 8 (eight) hours as needed for severe pain. Must last 30 days.  Dispense: 75 tablet; Refill: 0  3. Lumbar spondylosis  4. Lumbar radiculopathy  5. Chronic pain syndrome - oxyCODONE (OXY IR/ROXICODONE) 5 MG immediate release tablet; Take 1 tablet (5 mg total) by mouth every 8 (eight) hours as needed for severe pain. Must last 30 days.  Dispense: 75 tablet; Refill: 0 - oxyCODONE (OXY IR/ROXICODONE) 5 MG immediate release tablet; Take 1 tablet (5 mg total) by mouth every 8 (eight) hours as needed for severe  pain. Must last 30 days.  Dispense: 75 tablet; Refill: 0 - oxyCODONE (OXY IR/ROXICODONE) 5 MG immediate release tablet; Take 1 tablet (5 mg total) by mouth every 8 (eight) hours as needed for severe pain. Must last 30 days.  Dispense: 75 tablet; Refill: 0 - Drug Screen 10 W/Conf, Serum    -Continue with TENs unit -Discussed Qutenza, patient has a sensitivity to peppers so would like to avoid since active ingredient is concentrated capsaicin -Discussed exercise and encouraged her to walk more. -Repeat cervical ESI as needed cervical radicular pain flare.    Pharmacotherapy (Medications Ordered): Meds ordered  this encounter  Medications   oxyCODONE (OXY IR/ROXICODONE) 5 MG immediate release tablet    Sig: Take 1 tablet (5 mg total) by mouth every 8 (eight) hours as needed for severe pain. Must last 30 days.    Dispense:  75 tablet    Refill:  0    Chronic Pain. (STOP Act - Not applicable). Fill one day early if closed on scheduled refill date.   oxyCODONE (OXY IR/ROXICODONE) 5 MG immediate release tablet    Sig: Take 1 tablet (5 mg total) by mouth every 8 (eight) hours as needed for severe pain. Must last 30 days.    Dispense:  75 tablet    Refill:  0    Chronic Pain. (STOP Act - Not applicable). Fill one day early if closed on scheduled refill date.   oxyCODONE (OXY IR/ROXICODONE) 5 MG immediate release tablet    Sig: Take 1 tablet (5 mg total) by mouth every 8 (eight) hours as needed for severe pain. Must last 30 days.    Dispense:  75 tablet    Refill:  0    Chronic Pain. (STOP Act - Not applicable). Fill one day early if closed on scheduled refill date.   Orders:  Orders Placed This Encounter  Procedures   Drug Screen 10 W/Conf, Serum    Order Specific Question:   Release to patient    Answer:   Immediate   Follow-up plan:   Return in about 3 months (around 06/18/2023) for Medication Management, in person.     Recent Visits No visits were found meeting these  conditions. Showing recent visits within past 90 days and meeting all other requirements Today's Visits Date Type Provider Dept  03/18/23 Office Visit Gillis Santa, MD [facility]  Showing today's visits and meeting all other requirements Future Appointments No visits were found meeting these conditions. Showing future appointments within next 90 days and meeting all other requirements  I discussed the assessment and treatment plan with the patient. The patient was provided an opportunity to ask questions and all were answered. The patient agreed with the plan and demonstrated an understanding of the instructions.  Patient advised to call back or seek an in-person evaluation if the symptoms or condition worsens.  Duration of encounter: 25minutes.  Note by: Gillis Santa, MD Date: 03/18/2023; Time: 8:45 AM

## 2023-03-18 NOTE — Progress Notes (Signed)
Nursing Pain Medication Assessment:  Safety precautions to be maintained throughout the outpatient stay will include: orient to surroundings, keep bed in low position, maintain call bell within reach at all times, provide assistance with transfer out of bed and ambulation.  Medication Inspection Compliance: Pill count conducted under aseptic conditions, in front of the patient. Neither the pills nor the bottle was removed from the patient's sight at any time. Once count was completed pills were immediately returned to the patient in their original bottle.  Medication: Oxycodone IR Pill/Patch Count:  23 of 75 pills remain Pill/Patch Appearance: Markings consistent with prescribed medication Bottle Appearance: Standard pharmacy container. Clearly labeled. Filled Date: 03 / 05 / 2024 Last Medication intake:  YesterdaySafety precautions to be maintained throughout the outpatient stay will include: orient to surroundings, keep bed in low position, maintain call bell within reach at all times, provide assistance with transfer out of bed and ambulation.

## 2023-03-19 ENCOUNTER — Other Ambulatory Visit: Payer: Self-pay | Admitting: Nurse Practitioner

## 2023-03-19 DIAGNOSIS — N186 End stage renal disease: Secondary | ICD-10-CM

## 2023-03-19 NOTE — Telephone Encounter (Signed)
Requested medications are due for refill today.  See pharmacy note  Requested medications are on the active medications list.  yes  Last refill.   Future visit scheduled.     Notes to clinic.  Pharmacy comment: Script Clarification:THIS SIG CONFLICTS, PLS CLARIFY DOSE AND RESEND.    Requested Prescriptions  Pending Prescriptions Disp Refills   LANTUS 100 UNIT/ML injection [Pharmacy Med Name: LANTUS 100 UNIT/ML VIAL] 10 mL 0    Sig: INJECT 15 UNITS INTO THE SKIN DAILY. INJECT [place]     Endocrinology:  Diabetes - Insulins Failed - 03/19/2023 10:08 AM      Failed - HBA1C is between 0 and 7.9 and within 180 days    Hemoglobin A1C  Date Value Ref Range Status  07/20/2021 8.2  Final    Comment:    [facility]   HB A1C (BAYER DCA - WAIVED)  Date Value Ref Range Status  09/10/2022 6.7 (H) 4.8 - 5.6 % Final    Comment:             Prediabetes: 5.7 - 6.4          Diabetes: >6.4          Glycemic control for adults with diabetes: <7.0          Failed - Valid encounter within last 6 months    Recent Outpatient Visits           6 months ago Diabetic polyneuropathy associated with type 2 diabetes mellitus ([place])   [place], Mullins T, NP   11 months ago Hypertension associated with diabetes ([place])   [place], Ritchey T, NP   1 year ago Type 2 diabetes mellitus with ESRD (end-stage renal disease) ([place])   [place] Kibler, Bancroft T, NP   1 year ago Type 2 diabetes mellitus with ESRD (end-stage renal disease) ([place])   [place]ang, Henrine Screws T, NP   1 year ago Type 2 diabetes mellitus with ESRD (end-stage renal disease) ([place])   [facility] [place], [name], DO       Future Appointments             In 3 weeks Cannady, Barbaraann Faster, NP [place], [facility]

## 2023-03-19 NOTE — Telephone Encounter (Signed)
Future OV scheduled 04/13/23.  Requested Prescriptions  Pending Prescriptions Disp Refills   insulin glargine (LANTUS) 100 UNIT/ML injection [Pharmacy Med Name: LANTUS 100 UNIT/ML VIAL] 15 mL 0    Sig: Inject 0.15 mLs (15 Units total) into the skin daily. INJECT [place]     Endocrinology:  Diabetes - Insulins Failed - 03/19/2023  2:23 AM      Failed - HBA1C is between 0 and 7.9 and within 180 days    Hemoglobin A1C  Date Value Ref Range Status  07/20/2021 8.2  Final    Comment:    [facility]   HB A1C (BAYER DCA - WAIVED)  Date Value Ref Range Status  09/10/2022 6.7 (H) 4.8 - 5.6 % Final    Comment:             Prediabetes: 5.7 - 6.4          Diabetes: >6.4          Glycemic control for adults with diabetes: <7.0          Failed - Valid encounter within last 6 months    Recent Outpatient Visits           6 months ago Diabetic polyneuropathy associated with type 2 diabetes mellitus ([place])   [place], [place] T, NP   11 months ago Hypertension associated with diabetes ([place])   [place], Gaines T, NP   1 year ago Type 2 diabetes mellitus with ESRD (end-stage renal disease) ([place])   [place], [place] T, NP   1 year ago Type 2 diabetes mellitus with ESRD (end-stage renal disease) ([place])   [place], Henrine Screws T, NP   1 year ago Type 2 diabetes mellitus with ESRD (end-stage renal disease) ([place])   [facility] [place], [name], DO       Future Appointments             In 3 weeks Cannady, Barbaraann Faster, NP [place], [facility]

## 2023-03-25 ENCOUNTER — Other Ambulatory Visit: Payer: Self-pay | Admitting: Nurse Practitioner

## 2023-03-25 DIAGNOSIS — E1122 Type 2 diabetes mellitus with diabetic chronic kidney disease: Secondary | ICD-10-CM

## 2023-03-26 NOTE — Telephone Encounter (Signed)
Requested Prescriptions  Refused Prescriptions Disp Refills   LANTUS 100 UNIT/ML injection [Pharmacy Med Name: LANTUS 100 UNIT/ML VIAL] 10 mL 0    Sig: INJECT 15 UNITS INTO THE SKIN DAILY. INJECT 10 UNITS SUBCUTANEOUSLY ONCE A DAY     Endocrinology:  Diabetes - Insulins Failed - 03/25/2023  3:49 PM      Failed - HBA1C is between 0 and 7.9 and within 180 days    Hemoglobin A1C  Date Value Ref Range Status  07/20/2021 8.2  Final    Comment:    [facility]   HB A1C (BAYER DCA - WAIVED)  Date Value Ref Range Status  09/10/2022 6.7 (H) 4.8 - 5.6 % Final    Comment:             Prediabetes: 5.7 - 6.4          Diabetes: >6.4          Glycemic control for adults with diabetes: <7.0          Failed - Valid encounter within last 6 months    Recent Outpatient Visits           6 months ago Diabetic polyneuropathy associated with type 2 diabetes mellitus (HCC)   [place] [facility] [place], [place] T, NP   11 months ago Hypertension associated with diabetes (HCC)   [place] [facility] [place], [place]toe T, NP   1 year ago Type 2 diabetes mellitus with ESRD (end-stage renal disease) (HCC)   [place] [facility] [place], [place] T, NP   1 year ago Type 2 diabetes mellitus with ESRD (end-stage renal disease) (HCC)   [place] [facility] [place], Corrie Dandy T, NP   1 year ago Type 2 diabetes mellitus with ESRD (end-stage renal disease) (HCC)   [place] [facility] [place], Netta Neat, DO       Future Appointments             In 2 weeks Cannady, Dorie Rank, NP [facility], [facility]

## 2023-04-11 DIAGNOSIS — E1169 Type 2 diabetes mellitus with other specified complication: Secondary | ICD-10-CM | POA: Insufficient documentation

## 2023-04-11 DIAGNOSIS — E669 Obesity, unspecified: Secondary | ICD-10-CM | POA: Insufficient documentation

## 2023-04-11 NOTE — Patient Instructions (Signed)

## 2023-04-13 ENCOUNTER — Encounter: Payer: Self-pay | Admitting: Nurse Practitioner

## 2023-04-13 ENCOUNTER — Ambulatory Visit (INDEPENDENT_AMBULATORY_CARE_PROVIDER_SITE_OTHER): Payer: 59 | Admitting: Nurse Practitioner

## 2023-04-13 VITALS — BP 142/76 | HR 77 | Temp 97.9°F | Ht 65.98 in | Wt 231.0 lb

## 2023-04-13 DIAGNOSIS — M791 Myalgia, unspecified site: Secondary | ICD-10-CM

## 2023-04-13 DIAGNOSIS — E559 Vitamin D deficiency, unspecified: Secondary | ICD-10-CM

## 2023-04-13 DIAGNOSIS — E1159 Type 2 diabetes mellitus with other circulatory complications: Secondary | ICD-10-CM | POA: Diagnosis not present

## 2023-04-13 DIAGNOSIS — T466X5A Adverse effect of antihyperlipidemic and antiarteriosclerotic drugs, initial encounter: Secondary | ICD-10-CM

## 2023-04-13 DIAGNOSIS — B182 Chronic viral hepatitis C: Secondary | ICD-10-CM

## 2023-04-13 DIAGNOSIS — G894 Chronic pain syndrome: Secondary | ICD-10-CM

## 2023-04-13 DIAGNOSIS — E1142 Type 2 diabetes mellitus with diabetic polyneuropathy: Secondary | ICD-10-CM

## 2023-04-13 DIAGNOSIS — M0579 Rheumatoid arthritis with rheumatoid factor of multiple sites without organ or systems involvement: Secondary | ICD-10-CM

## 2023-04-13 DIAGNOSIS — N186 End stage renal disease: Secondary | ICD-10-CM

## 2023-04-13 DIAGNOSIS — Z6837 Body mass index (BMI) 37.0-37.9, adult: Secondary | ICD-10-CM

## 2023-04-13 DIAGNOSIS — E1169 Type 2 diabetes mellitus with other specified complication: Secondary | ICD-10-CM

## 2023-04-13 DIAGNOSIS — E1122 Type 2 diabetes mellitus with diabetic chronic kidney disease: Secondary | ICD-10-CM | POA: Diagnosis not present

## 2023-04-13 DIAGNOSIS — Z87891 Personal history of nicotine dependence: Secondary | ICD-10-CM

## 2023-04-13 DIAGNOSIS — R1012 Left upper quadrant pain: Secondary | ICD-10-CM | POA: Insufficient documentation

## 2023-04-13 DIAGNOSIS — Z992 Dependence on renal dialysis: Secondary | ICD-10-CM

## 2023-04-13 DIAGNOSIS — I739 Peripheral vascular disease, unspecified: Secondary | ICD-10-CM

## 2023-04-13 DIAGNOSIS — E785 Hyperlipidemia, unspecified: Secondary | ICD-10-CM

## 2023-04-13 DIAGNOSIS — F411 Generalized anxiety disorder: Secondary | ICD-10-CM

## 2023-04-13 DIAGNOSIS — F3341 Major depressive disorder, recurrent, in partial remission: Secondary | ICD-10-CM

## 2023-04-13 DIAGNOSIS — E113593 Type 2 diabetes mellitus with proliferative diabetic retinopathy without macular edema, bilateral: Secondary | ICD-10-CM

## 2023-04-13 DIAGNOSIS — F17219 Nicotine dependence, cigarettes, with unspecified nicotine-induced disorders: Secondary | ICD-10-CM

## 2023-04-13 DIAGNOSIS — I152 Hypertension secondary to endocrine disorders: Secondary | ICD-10-CM

## 2023-04-13 LAB — BAYER DCA HB A1C WAIVED: HB A1C (BAYER DCA - WAIVED): 7.4 % — ABNORMAL HIGH (ref 4.8–5.6)

## 2023-04-13 MED ORDER — CICLOPIROX 8 % EX SOLN: Freq: Every day | CUTANEOUS | 4 refills | Status: DC

## 2023-04-13 MED ORDER — LIDOCAINE 5 % EX PTCH: 1.0000 | MEDICATED_PATCH | CUTANEOUS | 0 refills | Status: DC

## 2023-04-13 MED ORDER — SEMAGLUTIDE(0.25 OR 0.5MG/DOS) 2 MG/3ML ~~LOC~~ SOPN: PEN_INJECTOR | SUBCUTANEOUS | 4 refills | Status: DC

## 2023-04-13 NOTE — Assessment & Plan Note (Signed)
BMI 37.30.  Recommended eating smaller high protein, low fat meals more frequently and exercising 30 mins a day 5 times a week with a goal of 10-15lb weight loss in the next 3 months. Patient voiced their understanding and motivation to adhere to these recommendations.  

## 2023-04-13 NOTE — Assessment & Plan Note (Signed)
Chronic, ongoing -- followed by GI, continue this collaboration, to return as needed.  Having current abdominal pain, will obtain CT to further assess and have her return to GI if any abnormal noted.

## 2023-04-13 NOTE — Assessment & Plan Note (Signed)
Chronic, ongoing.  Continue collaboration with nephrology and current dialysis regimen. Labs today. ?

## 2023-04-13 NOTE — Assessment & Plan Note (Signed)
Chronic, ongoing.  Did not tolerate any statins due to myalgia, even on reduced dosing.  Did not tolerate Zetia.  Discussed starting Repatha, she wishes to think about this and will alert provider if wishes to trial. 

## 2023-04-13 NOTE — Assessment & Plan Note (Signed)
Chronic, ongoing. Denies SI/HI.  She does not want maintenance medication.  Would avoid benzos due to her opioid use for pain, discussed at length with her.  She would benefit from a maintenance regimen in future that is renal/liver dosed, but declines today.

## 2023-04-13 NOTE — Assessment & Plan Note (Signed)
For 24 hours.  She has pain LUQ and RUQ with underlying cirrhosis.  Obtain CT scan without contrast to further assess abdomen and kidneys.  Determine next steps after imaging returned.  Can take Imodium as needed for diarrhea.

## 2023-04-13 NOTE — Assessment & Plan Note (Signed)
No smoking since August 2022 -- recommend continued cessation. 

## 2023-04-13 NOTE — Assessment & Plan Note (Addendum)
Followed by [facility] every 8 weeks, continue this collaboration.

## 2023-04-13 NOTE — Assessment & Plan Note (Signed)
Chronic, ongoing with recent dialysis A1c 7.4% today, upward trend from previous.  At this time will continue Lantus and Novolog as ordered and adjust if needed in future.  Start Ozempic 0.25 MG weekly for 4 weeks and then 0.5 MG weekly, instructed her on this and use + side effects to alert provider of.  No family history of thyroid cancer (MTC, MEN 2, thyroid cell tumors) or pancreatitis.  Discussed with her she may need to lower insulin dosing and to monitor sugars closely. Recommend she check blood sugar 3-4 times a day, may benefit from Freestyle in future.  Focus on diabetic diet at home.  Return in 4 weeks.

## 2023-04-13 NOTE — Assessment & Plan Note (Signed)
Followed by rheumatology and pain management, continue these collaborations.  Recent notes reviewed.  Continue medications as prescribed by them. She is aware all opioids must come from pain management. ?

## 2023-04-13 NOTE — Progress Notes (Signed)
BP (!) 142/76 (BP Location: Right Arm, Patient Position: Sitting, Cuff Size: Normal)   Pulse 77   Temp 97.9 F (36.6 C) (Oral)   Ht 5' 5.98" (1.676 m)   Wt 231 lb (104.8 kg)   SpO2 98%   BMI 37.30 kg/m    Subjective:    Patient ID: [name], female    DOB: Sep 09, 1966, 57 y.o.   MRN: [id number]  HPI: [name] is a 57 y.o. female  Chief Complaint  Patient presents with   Weight loss    Wants to talk about possibly getting on. Everyone at Dialysis on Ozempic and she states that they want her on it so she can lose weight   Flank Pain    Has been having pain in her left side since yesterday since they gave her imodium at Dialysis   Toe nail thicking    For the past 6 months, and would like something to help them.    DIABETES A1c September 6.7% -- taking Lantus and Novolog. She prefers vials vs pens -- that way she can see how much she is getting due to vision.  She is interested in weight loss and dialysis has recommended Ozempic. Hypoglycemic episodes:no Polydipsia/polyuria: no Visual disturbance: no Chest pain: no Paresthesias: yes Glucose Monitoring: no             Accucheck frequency: a few times a month             Fasting glucose: 50 to 200 range             Post prandial:             Evening:             Before meals: Taking Insulin?: yes             Long acting insulin: 15 unit Lantus             Short acting insulin: Novolog 5 to 10 units Blood Pressure Monitoring: a few times a week Retinal Examination: Up to Date, every 8 weeks -- glaucoma left eye and retinopathy both eyes -- gets injections both eyes -- [facility]  Foot Exam: Up to Date Pneumovax: Up to Date Influenza: Up to Date Aspirin: no    CHRONIC KIDNEY DISEASE With ESRD followed by nephrology.  Started on dialysis >7 years ago. Elevations in BP since then, but when in dialysis her BP will drop to lower levels.  Goes to dialysis Monday, Wednesday, Friday. CKD status: stable Medications  renally dose: yes Previous renal evaluation: yes Pneumovax:  Up to Date Influenza Vaccine:  Up to Date    HYPERTENSION / HYPERLIPIDEMIA Seen by cardiology with initial visit 06/30/22 - tried taking Zetia, but stopped as made her feel bad. Continues on Diltiazem and Coreg + no current statin -- gave her pain.  We discussed Repatha -- she wishes to think about this.  Tried Hydralazine in past = dry mouth + got orthostatic with this.  Has taken Clonidine, causes dry mouth.  Has been on ACE/ARB in past, hyperkalemia at baseline -- can not take these. She takes Midodrine with her to dialysis due to low BP with dialysis.     Satisfied with current treatment? yes Duration of hypertension: chronic BP monitoring frequency: a few times a week BP range:  BP medication side effects: no Past BP meds: multiple Duration of hyperlipidemia: chronic Past cholesterol medications: Zetia and statins Medication compliance: good compliance Aspirin:  no Recent stressors: no Recurrent headaches: no Visual changes: no Palpitations: no Dyspnea: no Chest pain: no Lower extremity edema: occasional Dizzy/lightheaded: no   RHEUMATOID ARTHRITIS Followed by rheumatology with infusions -- last 02/23/23. Saw neurosurgery on 04/07/22 last and MRIs with MRIs performed.  Follows with chronic pain clinic, last visit 03/18/23. Duration: chronic Pain: yes Symmetric: yes, 8/10 Quality: dull, aching, and throbbing Frequency: constant Context:  fluctuating Aggravating factors: movement Alleviating factors: Norco Relief with NSAIDs?: No NSAIDs Taken Treatments attempted: as above and on chart  CIRRHOSIS: She is followed by GI for history of Hep C she went through [place] procedure for this.  Last visit was 02/19/22.  Last EGD was 12/04/21.  She is to return to GI as needed.  Reports having flank pain to left side that started yesterday, was given some Imodium for diarrhea at dialysis, that helped the diarrhea.  The pain to  left side started after this.   Duration:days Onset: sudden Severity: 10/10 Quality: dull, aching, and throbbing Location:  LUQ Episode duration: constant Radiation: no Frequency: constant Alleviating factors: nothing Aggravating factors: taking a deep breath or yawning Status: fluctuating Treatments attempted:  Advil and Oxycodone Fever: no Nausea: occasional Vomiting: no Weight loss: no Decreased appetite: no Diarrhea: yes Constipation: yes Blood in stool: 2 days ago had constipation and passed some blood Heartburn: no Jaundice: no Rash: no Dysuria/urinary frequency: no Hematuria: no History of sexually transmitted disease: no Recurrent NSAID use: no    ANXIETY/STRESS No current medications for mood.  Buspar made her feel bad.  Mood depends on day of week. Duration:stable Anxious mood: yes Excessive worrying: yes Irritability: yes  Sweating: no Nausea: no Palpitations:no Hyperventilation: no Panic attacks:  yes Agoraphobia: no  Obscessions/compulsions: no Depressed mood: yes    12/17/2022    9:41 AM 09/10/2022   11:06 AM 05/12/22   10:14 AM 03/18/2022   10:34 AM 11/11/2021   10:04 AM  Depression screen PHQ 2/9  Decreased Interest 0 0 0 3 2  Down, Depressed, Hopeless 0 0 0 1 2  PHQ - 2 Score 0 0 0 4 4  Altered sleeping  3 2 3 3   Tired, decreased energy  1 2 3 3   Change in appetite  0 0 3 2  Feeling bad or failure about yourself   0 0 0 3  Trouble concentrating  0 1 0   Moving slowly or fidgety/restless  0 2 1 2   Suicidal thoughts  0 0 0 0  PHQ-9 Score  4 7 14 17   Difficult doing work/chores  Not difficult at all   Somewhat difficult   Anhedonia: no Weight changes: no Insomnia: yes hard to fall asleep  Hypersomnia: no Fatigue/loss of energy: yes Feelings of worthlessness: no Feelings of guilt: yes Impaired concentration/indecisiveness: no Suicidal ideations: no  Crying spells: yes Recent Stressors/Life Changes: yes   Relationship problems: no    Family stress: yes     Financial stress: no    Job stress: no    Recent death/loss: no     05-12-22   10:15 AM 03/18/2022   10:34 AM 11/11/2021   10:05 AM 08/21/2021    9:30 AM  GAD 7 : Generalized Anxiety Score  Nervous, Anxious, on Edge 2 0 3 3  Control/stop worrying 2 0 3 2  Worry too much - different things 2 0 3 2  Trouble relaxing 2 0 3 3  Restless 2 0 3 3  Easily annoyed or irritable 2 0  3 3  Afraid - awful might happen 1 0 2 3  Total GAD 7 Score 13 0 20 19  Anxiety Difficulty Somewhat difficult  Somewhat difficult Very difficult    Relevant past medical, surgical, family and social history reviewed and updated as indicated. Interim medical history since our last visit reviewed. Allergies and medications reviewed and updated.  Review of Systems  Constitutional:  Negative for activity change, appetite change, diaphoresis, fatigue and fever.  Respiratory:  Negative for cough, chest tightness, shortness of breath and wheezing.   Cardiovascular:  Negative for chest pain, palpitations and leg swelling.  Gastrointestinal:  Positive for abdominal pain, constipation and diarrhea. Negative for abdominal distention, nausea and vomiting.  Endocrine: Negative for cold intolerance, heat intolerance, polydipsia, polyphagia and polyuria.  Musculoskeletal:  Positive for arthralgias.  Neurological: Negative.   Psychiatric/Behavioral:  Positive for sleep disturbance. Negative for decreased concentration, self-injury and suicidal ideas. The patient is nervous/anxious.     Per HPI unless specifically indicated above     Objective:    BP (!) 142/76 (BP Location: Right Arm, Patient Position: Sitting, Cuff Size: Normal)   Pulse 77   Temp 97.9 F (36.6 C) (Oral)   Ht 5' 5.98" (1.676 m)   Wt 231 lb (104.8 kg)   SpO2 98%   BMI 37.30 kg/m   Wt Readings from Last 3 Encounters:  04/13/23 231 lb (104.8 kg)  03/18/23 226 lb (102.5 kg)  12/17/22 230 lb (104.3 kg)    Physical Exam Vitals  and nursing note reviewed.  Constitutional:      General: She is awake. She is not in acute distress.    Appearance: She is well-developed and well-groomed. She is obese. She is not ill-appearing or toxic-appearing.  HENT:     Head: Normocephalic.     Right Ear: Hearing, ear canal and external ear normal.     Left Ear: Hearing, ear canal and external ear normal.  Eyes:     General: Lids are normal.        Right eye: No discharge.        Left eye: No discharge.     Conjunctiva/sclera: Conjunctivae normal.     Pupils: Pupils are equal, round, and reactive to light.  Neck:     Thyroid: No thyromegaly.     Vascular: No carotid bruit.  Cardiovascular:     Rate and Rhythm: Normal rate and regular rhythm.     Heart sounds: Normal heart sounds. No murmur heard.    No gallop.     Arteriovenous access: Left arteriovenous access is present. Pulmonary:     Effort: Pulmonary effort is normal. No accessory muscle usage or respiratory distress.     Breath sounds: Normal breath sounds.  Abdominal:     General: Bowel sounds are normal. There is no distension.     Palpations: Abdomen is soft. There is no hepatomegaly or splenomegaly.     Tenderness: There is abdominal tenderness in the right upper quadrant and left upper quadrant. There is rebound (from right to left). There is no right CVA tenderness, left CVA tenderness or guarding.  Musculoskeletal:     Cervical back: Normal range of motion and neck supple.     Right lower leg: No edema.     Left lower leg: No edema.  Lymphadenopathy:     Cervical: No cervical adenopathy.  Skin:    General: Skin is warm and dry.  Neurological:     Mental Status: She is alert  and oriented to person, place, and time.  Psychiatric:        Attention and Perception: Attention normal.        Mood and Affect: Mood normal.        Speech: Speech normal.        Behavior: Behavior normal. Behavior is cooperative.        Thought Content: Thought content normal.     Diabetic Foot Exam - Simple   Simple Foot Form Visual Inspection See comments: Yes Sensation Testing See comments: Yes Pulse Check See comments: Yes Comments 1+ DP and PT to both feet.  Sensation 6/10 left and 5/10 right.  Large callus to pedal aspect upper left foot -- followed by podiatry in past and monitor only, has been present 7 years since burning foot.  Toenails thick.     Results for orders placed or performed in visit on 04/13/23  Bayer DCA Hb A1c Waived  Result Value Ref Range   HB A1C (BAYER DCA - WAIVED) 7.4 (H) 4.8 - 5.6 %      Assessment & Plan:   Problem List Items Addressed This Visit       Cardiovascular and Mediastinum   Hypertension associated with diabetes    Chronic, ongoing with initial BP elevated -- repeat is trending down and previous readings + home better.  She does run low during dialysis -- continue Midodrine as needed in dialysis.  Continue to collaborate with cardiology, recommend she follow-up with them.  Can not take ACE/ARB due to underlying hyperkalemia.  Continue Diltiazem and Torsemide at current doses.  Has had multiple adverse affects to BP medications.  Focus on DASH diet at home.  Check BP daily and document for provider visits.  Labs: CMP and TSH, unable to attain urine due to ESRD.      Relevant Medications   Semaglutide,0.25 or 0.5MG /DOS, 2 MG/3ML SOPN   Other Relevant Orders   Bayer DCA Hb A1c Waived (Completed)   Comprehensive metabolic panel   CBC with Differential/Platelet   TSH   PAD (peripheral artery disease)    Chronic, stable.  Continue to monitor and continue to assess for skin breakdown.          Digestive   Chronic hepatitis C without hepatic coma    Chronic, ongoing -- followed by GI, continue this collaboration, to return as needed.  Having current abdominal pain, will obtain CT to further assess and have her return to GI if any abnormal noted.      Relevant Medications   ciclopirox (PENLAC) 8 % solution      Endocrine   Diabetic polyneuropathy associated with type 2 diabetes mellitus    Chronic, ongoing with recent dialysis A1c 7.4% today, upward trend from previous.  At this time will continue Lantus and Novolog as ordered and adjust if needed in future.  Start Ozempic 0.25 MG weekly for 4 weeks and then 0.5 MG weekly, instructed her on this and use + side effects to alert provider of.  No family history of thyroid cancer (MTC, MEN 2, thyroid cell tumors) or pancreatitis.  Discussed with her she may need to lower insulin dosing and to monitor sugars closely. Recommend she check blood sugar 3-4 times a day, may benefit from Freestyle in future.  Focus on diabetic diet at home.  Return in 4 weeks.      Relevant Medications   Semaglutide,0.25 or 0.5MG /DOS, 2 MG/3ML SOPN   Other Relevant Orders   Bayer DCA Hb  A1c Waived (Completed)   Hyperlipidemia associated with type 2 diabetes mellitus    Chronic, ongoing.  Did not tolerate any statins due to myalgia, even on reduced dosing.  Did not tolerate Zetia.  Discussed starting Repatha, she wishes to think about this and will alert provider if wishes to trial.      Relevant Medications   Semaglutide,0.25 or 0.5MG /DOS, 2 MG/3ML SOPN   Other Relevant Orders   Bayer DCA Hb A1c Waived (Completed)   Lipid Panel w/o Chol/HDL Ratio   Proliferative diabetic retinopathy associated with type 2 diabetes mellitus    Followed by [facility] every 8 weeks, continue this collaboration.      Relevant Medications   Semaglutide,0.25 or 0.5MG /DOS, 2 MG/3ML SOPN   Type 2 diabetes mellitus with ESRD (end-stage renal disease) - Primary    Chronic, ongoing with recent dialysis A1c 7.4% today, upward trend from previous.  At this time will continue Lantus and Novolog as ordered and adjust if needed in future.  Start Ozempic 0.25 MG weekly for 4 weeks and then 0.5 MG weekly, instructed her on this and use + side effects to alert provider of.  No family history of thyroid  cancer (MTC, MEN 2, thyroid cell tumors) or pancreatitis.  Discussed with her she may need to lower insulin dosing and to monitor sugars closely. Recommend she check blood sugar 3-4 times a day, may benefit from Freestyle in future.  Focus on diabetic diet at home.  Return in 4 weeks.      Relevant Medications   Semaglutide,0.25 or 0.5MG /DOS, 2 MG/3ML SOPN   Other Relevant Orders   Bayer DCA Hb A1c Waived (Completed)     Musculoskeletal and Integument   Rheumatoid arthritis involving multiple sites with positive rheumatoid factor    Chronic,ongoing.  Followed by rheumatology and pain management, continue these collaborations.  Recent notes reviewed.  Continue medications as prescribed by them.          Genitourinary   ESRD (end stage renal disease) on dialysis    Chronic, ongoing.  Continue collaboration with nephrology and current dialysis regimen. Labs today.      Relevant Orders   Comprehensive metabolic panel     Other   Chronic pain syndrome    Followed by rheumatology and pain management, continue these collaborations.  Recent notes reviewed.  Continue medications as prescribed by them. She is aware all opioids must come from pain management.      Former cigarette smoker    No smoking since August 2022 -- recommend continued cessation.      GAD (generalized anxiety disorder)    Refer to depression plan of care.      LUQ pain    For 24 hours.  She has pain LUQ and RUQ with underlying cirrhosis.  Obtain CT scan without contrast to further assess abdomen and kidneys.  Determine next steps after imaging returned.  Can take Imodium as needed for diarrhea.      Relevant Orders   CT Abdomen Pelvis Wo Contrast   Myalgia due to statin    Chronic, ongoing.  Did not tolerate any statins due to myalgia, even on reduced dosing.  Did not tolerate Zetia.  Discussed starting Repatha, she wishes to think about this and will alert provider if wishes to trial.      Obesity    BMI  37.30.  Recommended eating smaller high protein, low fat meals more frequently and exercising 30 mins a day 5 times a  week with a goal of 10-15lb weight loss in the next 3 months. Patient voiced their understanding and motivation to adhere to these recommendations.       Relevant Medications   Semaglutide,0.25 or 0.5MG /DOS, 2 MG/3ML SOPN   Recurrent major depression in partial remission    Chronic, ongoing. Denies SI/HI.  She does not want maintenance medication.  Would avoid benzos due to her opioid use for pain, discussed at length with her.  She would benefit from a maintenance regimen in future that is renal/liver dosed, but declines today.        Vitamin D deficiency    Chronic, taking supplement on occasion.  Recheck today.      Relevant Orders   VITAMIN D 25 Hydroxy (Vit-D Deficiency, Fractures)     Follow up plan: Return in about 4 weeks (around 05/11/2023) for LUQ pain and T2DM -- started Ozempic.

## 2023-04-13 NOTE — Assessment & Plan Note (Signed)
Chronic, stable.  Continue to monitor and continue to assess for skin breakdown.   

## 2023-04-13 NOTE — Assessment & Plan Note (Signed)
Refer to depression plan of care. 

## 2023-04-13 NOTE — Assessment & Plan Note (Signed)
Chronic, ongoing.  Did not tolerate any statins due to myalgia, even on reduced dosing.  Did not tolerate Zetia.  Discussed starting Repatha, she wishes to think about this and will alert provider if wishes to trial.

## 2023-04-13 NOTE — Assessment & Plan Note (Signed)
Chronic,ongoing.  Followed by rheumatology and pain management, continue these collaborations.  Recent notes reviewed.  Continue medications as prescribed by them.   

## 2023-04-13 NOTE — Assessment & Plan Note (Addendum)
Chronic, ongoing with recent dialysis A1c 7.4% today, upward trend from previous.  At this time will continue Lantus and Novolog as ordered and adjust if needed in future.  Start Ozempic 0.25 MG weekly for 4 weeks and then 0.5 MG weekly, instructed her on this and use + side effects to alert provider of.  No family history of thyroid cancer (MTC, MEN 2, thyroid cell tumors) or pancreatitis.  Discussed with her she may need to lower insulin dosing and to monitor sugars closely. Recommend she check blood sugar 3-4 times a day, may benefit from Freestyle in future.  Focus on diabetic diet at home.  Return in 4 weeks. 

## 2023-04-13 NOTE — Assessment & Plan Note (Signed)
Chronic, ongoing with initial BP elevated -- repeat is trending down and previous readings + home better.  She does run low during dialysis -- continue Midodrine as needed in dialysis.  Continue to collaborate with cardiology, recommend she follow-up with them.  Can not take ACE/ARB due to underlying hyperkalemia.  Continue Diltiazem and Torsemide at current doses.  Has had multiple adverse affects to BP medications.  Focus on DASH diet at home.  Check BP daily and document for provider visits.  Labs: CMP and TSH, unable to attain urine due to ESRD.

## 2023-04-13 NOTE — Assessment & Plan Note (Signed)
Chronic, taking supplement on occasion.  Recheck today. 

## 2023-04-14 ENCOUNTER — Other Ambulatory Visit: Payer: Self-pay | Admitting: Nurse Practitioner

## 2023-04-14 LAB — COMPREHENSIVE METABOLIC PANEL
ALT: 19 IU/L (ref 0–32)
AST: 18 IU/L (ref 0–40)
Albumin/Globulin Ratio: 1.3 (ref 1.2–2.2)
Albumin: 4.3 g/dL (ref 3.8–4.9)
Alkaline Phosphatase: 104 IU/L (ref 44–121)
BUN/Creatinine Ratio: 8 — ABNORMAL LOW (ref 9–23)
BUN: 55 mg/dL — ABNORMAL HIGH (ref 6–24)
Bilirubin Total: 0.3 mg/dL (ref 0.0–1.2)
CO2: 19 mmol/L — ABNORMAL LOW (ref 20–29)
Calcium: 8.7 mg/dL (ref 8.7–10.2)
Chloride: 99 mmol/L (ref 96–106)
Creatinine, Ser: 6.96 mg/dL — ABNORMAL HIGH (ref 0.57–1.00)
Globulin, Total: 3.2 g/dL (ref 1.5–4.5)
Glucose: 236 mg/dL — ABNORMAL HIGH (ref 70–99)
Potassium: 5.2 mmol/L (ref 3.5–5.2)
Sodium: 139 mmol/L (ref 134–144)
Total Protein: 7.5 g/dL (ref 6.0–8.5)
eGFR: 6 mL/min/{1.73_m2} — ABNORMAL LOW (ref >59)

## 2023-04-14 LAB — LIPID PANEL W/O CHOL/HDL RATIO
Cholesterol, Total: 182 mg/dL (ref 100–199)
HDL: 40 mg/dL (ref >39)
LDL Chol Calc (NIH): 114 mg/dL — ABNORMAL HIGH (ref 0–99)
Triglycerides: 155 mg/dL — ABNORMAL HIGH (ref 0–149)
VLDL Cholesterol Cal: 28 mg/dL (ref 5–40)

## 2023-04-14 LAB — CBC WITH DIFFERENTIAL/PLATELET
Basophils Absolute: 0.1 10*3/uL (ref 0.0–0.2)
Basos: 1 %
EOS (ABSOLUTE): 0.2 10*3/uL (ref 0.0–0.4)
Eos: 3 %
Hematocrit: 34.4 % (ref 34.0–46.6)
Hemoglobin: 11.1 g/dL (ref 11.1–15.9)
Immature Grans (Abs): 0.1 10*3/uL (ref 0.0–0.1)
Immature Granulocytes: 1 %
Lymphocytes Absolute: 1.7 10*3/uL (ref 0.7–3.1)
Lymphs: 23 %
MCH: 30.8 pg (ref 26.6–33.0)
MCHC: 32.3 g/dL (ref 31.5–35.7)
MCV: 96 fL (ref 79–97)
Monocytes Absolute: 0.7 10*3/uL (ref 0.1–0.9)
Monocytes: 9 %
Neutrophils Absolute: 4.8 10*3/uL (ref 1.4–7.0)
Neutrophils: 63 %
Platelets: 155 10*3/uL (ref 150–450)
RBC: 3.6 x10E6/uL — ABNORMAL LOW (ref 3.77–5.28)
RDW: 12.2 % (ref 11.7–15.4)
WBC: 7.5 10*3/uL (ref 3.4–10.8)

## 2023-04-14 LAB — TSH: TSH: 1.46 u[IU]/mL (ref 0.450–4.500)

## 2023-04-14 LAB — VITAMIN D 25 HYDROXY (VIT D DEFICIENCY, FRACTURES): Vit D, 25-Hydroxy: 11.2 ng/mL — ABNORMAL LOW (ref 30.0–100.0)

## 2023-04-14 MED ORDER — VITAMIN D3 25 MCG (1000 UT) PO CAPS: 2000.0000 [IU] | ORAL_CAPSULE | Freq: Every day | ORAL | 12 refills | Status: DC

## 2023-04-14 NOTE — Progress Notes (Signed)
Contacted via MyChart   Good evening [name], your labs have returned: - Kidney function remains at baseline for you from previous labs.  Liver labs, AST and ALT, are normal. - CBC is stable at this time with no anemia or infection. - Cholesterol labs show elevation in LDL and triglycerides -- I do highly recommend starting Repatha as we discussed.  If would like to try this let me know. - Vitamin D level very low -- I recommend we add on Vitamin D 2000 units daily, which I will send in. You can also get over the counter in vitamin section. - Thyroid lab is normal.  Any questions? Keep being awesome!!  Thank you for allowing me to participate in your care.  I appreciate you. Kindest regards, Onesimo Lingard

## 2023-04-15 ENCOUNTER — Other Ambulatory Visit
Admission: RE | Admit: 2023-04-15 | Discharge: 2023-04-15 | Disposition: A | Discharge status: Home / Self Care | Payer: 59 | Source: Ambulatory Visit | Attending: Student in an Organized Health Care Education/Training Program | Admitting: Student in an Organized Health Care Education/Training Program

## 2023-04-15 DIAGNOSIS — G894 Chronic pain syndrome: Secondary | ICD-10-CM | POA: Diagnosis present

## 2023-04-25 LAB — DRUG SCREEN 10 W/CONF, SERUM
Amphetamines, IA: NEGATIVE ng/mL
Barbiturates, IA: NEGATIVE ug/mL
Benzodiazepines, IA: NEGATIVE ng/mL
Cocaine & Metabolite, IA: NEGATIVE ng/mL
Methadone, IA: NEGATIVE ng/mL
Opiates, IA: NEGATIVE ng/mL
Oxycodones, IA: POSITIVE ng/mL — AB
Phencyclidine, IA: NEGATIVE ng/mL
Propoxyphene, IA: NEGATIVE ng/mL
THC(Marijuana) Metabolite, IA: NEGATIVE ng/mL

## 2023-04-25 LAB — OXYCODONES,MS,WB/SP RFX
Oxycocone: 8.9 ng/mL
Oxycodones Confirmation: POSITIVE
Oxymorphone: NEGATIVE ng/mL

## 2023-04-26 ENCOUNTER — Other Ambulatory Visit: Payer: Self-pay | Admitting: Nurse Practitioner

## 2023-04-27 ENCOUNTER — Other Ambulatory Visit: Payer: Self-pay | Admitting: Nurse Practitioner

## 2023-04-27 DIAGNOSIS — E1122 Type 2 diabetes mellitus with diabetic chronic kidney disease: Secondary | ICD-10-CM

## 2023-04-27 NOTE — Telephone Encounter (Signed)
Requested Prescriptions  Pending Prescriptions Disp Refills   NOVOLOG 100 UNIT/ML injection [Pharmacy Med Name: NOVOLOG 100 UNIT/ML VIAL] 10 mL 5    Sig: INJECT 5 TO 10 UNITS INTO THE SKIN 3 TIMES A DAY BEFORE MEALS     Endocrinology:  Diabetes - Insulins Passed - 04/27/2023  8:40 AM      Passed - HBA1C is between 0 and 7.9 and within 180 days    Hemoglobin A1C  Date Value Ref Range Status  07/20/2021 8.2  Final    Comment:    [facility]   HB A1C (BAYER DCA - WAIVED)  Date Value Ref Range Status  04/13/2023 7.4 (H) 4.8 - 5.6 % Final    Comment:             Prediabetes: 5.7 - 6.4          Diabetes: >6.4          Glycemic control for adults with diabetes: <7.0          Passed - Valid encounter within last 6 months    Recent Outpatient Visits           2 weeks ago Type 2 diabetes mellitus with ESRD (end-stage renal disease) (HCC)   [place] [facility] [place], Jolene T, NP   7 months ago Diabetic polyneuropathy associated with type 2 diabetes mellitus (HCC)   [place] [facility] [place], [place]ral[place] T, NP   1 year ago Hypertension associated with diabetes (HCC)   [place] [facility] [place], [place] T, NP   1 year ago Type 2 diabetes mellitus with ESRD (end-stage renal disease) (HCC)   [place] [facility] [place], Jolene T, NP   1 year ago Type 2 diabetes mellitus with ESRD (end-stage renal disease) (HCC)   [place] [facility] [place], Dorie Rank, NP       Future Appointments             In 2 weeks Cannady, Dorie Rank, NP [place] [facility]             LANTUS 100 UNIT/ML injection [Pharmacy Med Name: LANTUS 100 UNIT/ML VIAL] 10 mL 0    Sig: INJECT 15 UNITS INTO THE SKIN DAILY. INJECT 10 UNITS SUBCUTANEOUSLY ONCE A DAY     Endocrinology:  Diabetes - Insulins Passed - 04/27/2023  8:40 AM      Passed - HBA1C is between 0 and 7.9 and within 180 days    Hemoglobin  A1C  Date Value Ref Range Status  07/20/2021 8.2  Final    Comment:    [facility]   HB A1C (BAYER DCA - WAIVED)  Date Value Ref Range Status  04/13/2023 7.4 (H) 4.8 - 5.6 % Final    Comment:             Prediabetes: 5.7 - 6.4          Diabetes: >6.4          Glycemic control for adults with diabetes: <7.0          Passed - Valid encounter within last 6 months    Recent Outpatient Visits           2 weeks ago Type 2 diabetes mellitus with ESRD (end-stage renal disease) (HCC)   [place] [facility] [place], Jolene T, NP   7 months ago Diabetic polyneuropathy associated with type 2 diabetes mellitus (HCC)  [place] [facility] [place], Corrie Dandy T, NP   1 year ago Hypertension associated with diabetes [facility])   [place] [facility] [place], [place] T, NP   1 year ago Type 2 diabetes mellitus with ESRD (end-stage renal disease) (HCC)   [place] [facility] [place], Corrie Dandy T, NP   1 year ago Type 2 diabetes mellitus with ESRD (end-stage renal disease) (HCC)   [place] [facility] [place], Dorie Rank, NP       Future Appointments             In 2 weeks Cannady, Dorie Rank, NP [place] [facility]

## 2023-04-27 NOTE — Telephone Encounter (Signed)
Requested medications are due for refill today.  yes  Requested medications are on the active medications list.  yes  Last refill. 01/13/2023 #100 3rf  Future visit scheduled.   yes  Notes to clinic.  Protocol did not transfer. Please review for refill.    Requested Prescriptions  Pending Prescriptions Disp Refills   BD INSULIN SYRINGE U/F 30G X 1/2" 0.5 ML MISC [Pharmacy Med Name: BD INS SYR UF 0.5ML 12.7MMX30G] 100 each 3    Sig: USE 4 TIMES DAILY WITH LANTUS & NOVOLOG     There is no refill protocol information for this order

## 2023-04-29 ENCOUNTER — Other Ambulatory Visit: Payer: Self-pay | Admitting: Nurse Practitioner

## 2023-04-29 ENCOUNTER — Ambulatory Visit
Admission: RE | Admit: 2023-04-29 | Discharge: 2023-04-29 | Disposition: A | Discharge status: Home / Self Care | Payer: 59 | Source: Ambulatory Visit | Attending: Nurse Practitioner | Admitting: Nurse Practitioner

## 2023-04-29 ENCOUNTER — Encounter: Payer: Self-pay | Admitting: Nurse Practitioner

## 2023-04-29 DIAGNOSIS — E1122 Type 2 diabetes mellitus with diabetic chronic kidney disease: Secondary | ICD-10-CM

## 2023-04-29 DIAGNOSIS — R1012 Left upper quadrant pain: Secondary | ICD-10-CM | POA: Diagnosis present

## 2023-04-29 MED ORDER — INSULIN GLARGINE 100 UNIT/ML ~~LOC~~ SOLN
SUBCUTANEOUS | 0 refills | Status: DC
Start: 2023-04-29 — End: 2023-05-25

## 2023-04-29 NOTE — Telephone Encounter (Signed)
Requested medication (s) are due for refill today -no  Requested medication (s) are on the active medication list -yes  Future visit scheduled -yes  Last refill: 04/29/23  Notes to clinic: Pharmacy request: Alternative requested- Rx not covered by insurance  Requested Prescriptions  Pending Prescriptions Disp Refills   LANTUS SOLOSTAR 100 UNIT/ML Solostar Pen [Pharmacy Med Name: LANTUS SOLOSTAR 100 UNIT/ML]  0     Endocrinology:  Diabetes - Insulins Passed - 04/29/2023  1:19 PM      Passed - HBA1C is between 0 and 7.9 and within 180 days    Hemoglobin A1C  Date Value Ref Range Status  07/20/2021 8.2  Final    Comment:    [facility]   HB A1C (BAYER DCA - WAIVED)  Date Value Ref Range Status  04/13/2023 7.4 (H) 4.8 - 5.6 % Final    Comment:             Prediabetes: 5.7 - 6.4          Diabetes: >6.4          Glycemic control for adults with diabetes: <7.0          Passed - Valid encounter within last 6 months    Recent Outpatient Visits           2 weeks ago Type 2 diabetes mellitus with ESRD (end-stage renal disease) (HCC)   [place] [facility] [place], Jolene T, NP   7 months ago Diabetic polyneuropathy associated with type 2 diabetes mellitus (HCC)   [place] [facility] [place], [place] T, NP   1 year ago Hypertension associated with diabetes (HCC)   [place] [facility] [place], Corrie Dandy T, NP   1 year ago Type 2 diabetes mellitus with ESRD (end-stage renal disease) (HCC)   [place] [facility] [place], Jolene T, NP   1 year ago Type 2 diabetes mellitus with ESRD (end-stage renal disease) (HCC)   [place] [facility] [place], Corrie Dandy T, NP       Future Appointments             In 1 week Cannady, Dorie Rank, NP [place] [facility]               Requested Prescriptions  Pending Prescriptions Disp Refills   LANTUS SOLOSTAR 100 UNIT/ML Solostar Pen  [Pharmacy Med Name: LANTUS SOLOSTAR 100 UNIT/ML]  0     Endocrinology:  Diabetes - Insulins Passed - 04/29/2023  1:19 PM      Passed - HBA1C is between 0 and 7.9 and within 180 days    Hemoglobin A1C  Date Value Ref Range Status  07/20/2021 8.2  Final    Comment:    [facility]   HB A1C (BAYER DCA - WAIVED)  Date Value Ref Range Status  04/13/2023 7.4 (H) 4.8 - 5.6 % Final    Comment:             Prediabetes: 5.7 - 6.4          Diabetes: >6.4          Glycemic control for adults with diabetes: <7.0          Passed - Valid encounter within last 6 months    Recent Outpatient Visits           2 weeks ago Type 2 diabetes mellitus with ESRD (end-stage renal disease) (HCC)   [place] [facility] [place], Jolene T, NP   7 months ago Diabetic polyneuropathy associated with type 2 diabetes mellitus (HCC)   [place] [facility] [place], Corrie Dandy T, NP   1 year ago Hypertension associated with diabetes (HCC)   [place] [facility] [place], Corrie Dandy T, NP   1 year ago Type 2 diabetes mellitus with ESRD (end-stage renal disease) (HCC)   [place] [facility] [place], Corrie Dandy T, NP   1 year ago Type 2 diabetes mellitus with ESRD (end-stage renal disease) (HCC)   [place] [facility] [place], Dorie Rank, NP       Future Appointments             In 1 week Cannady, Dorie Rank, NP [facility]

## 2023-05-03 NOTE — Telephone Encounter (Signed)
Spoke  with pharmacy and Pharmacy Tech  stated that when she ran it as brand name vials, it went through  with no charge

## 2023-05-03 NOTE — Telephone Encounter (Signed)
Spoke with patient and she stated that she is visually impaired and needs vials.

## 2023-05-04 ENCOUNTER — Encounter: Payer: Self-pay | Admitting: Nurse Practitioner

## 2023-05-04 NOTE — Progress Notes (Signed)
Contacted via MyChart   Good morning [name], your imaging has returned and no acute issues found.  Everything remains stable. If symptoms persist definitely reach out to GI for further assessment, especially with your underlying cirrhosis.  You did have some outpouching (diverticulosis) which is common finding on imaging, but no inflammation of this area concerning for infection.  Any questions? Keep being awesome!!  Thank you for allowing me to participate in your care.  I appreciate you. Kindest regards, Stephana Morell

## 2023-05-08 NOTE — Patient Instructions (Signed)
Diabetes Mellitus Basics  Diabetes mellitus, or diabetes, is a long-term (chronic) disease. It occurs when the body does not properly use sugar (glucose) that is released from food after you eat. Diabetes mellitus may be caused by one or both of these problems: Your pancreas does not make enough of a hormone called insulin. Your body does not react in a normal way to the insulin that it makes. Insulin lets glucose enter cells in your body. This gives you energy. If you have diabetes, glucose cannot get into cells. This causes high blood glucose (hyperglycemia). How to treat and manage diabetes You may need to take insulin or other diabetes medicines daily to keep your glucose in balance. If you are prescribed insulin, you will learn how to give yourself insulin by injection. You may need to adjust the amount of insulin you take based on the foods that you eat. You will need to check your blood glucose levels using a glucose monitor as told by your health care provider. The readings can help determine if you have low or high blood glucose. Generally, you should have these blood glucose levels: Before meals (preprandial): 80-130 mg/dL (4.4-7.2 mmol/L). After meals (postprandial): below 180 mg/dL (10 mmol/L). Hemoglobin A1c (HbA1c) level: less than 7%. Your health care provider will set treatment goals for you. Keep all follow-up visits. This is important. Follow these instructions at home: Diabetes medicines Take your diabetes medicines every day as told by your health care provider. List your diabetes medicines here: Name of medicine: ______________________________ Amount (dose): _______________ Time (a.m./p.m.): _______________ Notes: ___________________________________ Name of medicine: ______________________________ Amount (dose): _______________ Time (a.m./p.m.): _______________ Notes: ___________________________________ Name of medicine: ______________________________ Amount (dose):  _______________ Time (a.m./p.m.): _______________ Notes: ___________________________________ Insulin If you use insulin, list the types of insulin you use here: Insulin type: ______________________________ Amount (dose): _______________ Time (a.m./p.m.): _______________Notes: ___________________________________ Insulin type: ______________________________ Amount (dose): _______________ Time (a.m./p.m.): _______________ Notes: ___________________________________ Insulin type: ______________________________ Amount (dose): _______________ Time (a.m./p.m.): _______________ Notes: ___________________________________ Insulin type: ______________________________ Amount (dose): _______________ Time (a.m./p.m.): _______________ Notes: ___________________________________ Insulin type: ______________________________ Amount (dose): _______________ Time (a.m./p.m.): _______________ Notes: ___________________________________ Managing blood glucose  Check your blood glucose levels using a glucose monitor as told by your health care provider. Write down the times that you check your glucose levels here: Time: _______________ Notes: ___________________________________ Time: _______________ Notes: ___________________________________ Time: _______________ Notes: ___________________________________ Time: _______________ Notes: ___________________________________ Time: _______________ Notes: ___________________________________ Time: _______________ Notes: ___________________________________  Low blood glucose Low blood glucose (hypoglycemia) is when glucose is at or below 70 mg/dL (3.9 mmol/L). Symptoms may include: Feeling: Hungry. Sweaty and clammy. Irritable or easily upset. Dizzy. Sleepy. Having: A fast heartbeat. A headache. A change in your vision. Numbness around the mouth, lips, or tongue. Having trouble with: Moving (coordination). Sleeping. Treating low blood glucose To treat low blood  glucose, eat or drink something containing sugar right away. If you can think clearly and swallow safely, follow the 15:15 rule: Take 15 grams of a fast-acting carb (carbohydrate), as told by your health care provider. Some fast-acting carbs are: Glucose tablets: take 3-4 tablets. Hard candy: eat 3-5 pieces. Fruit juice: drink 4 oz (120 mL). Regular (not diet) soda: drink 4-6 oz (120-180 mL). Honey or sugar: eat 1 Tbsp (15 mL). Check your blood glucose levels 15 minutes after you take the carb. If your glucose is still at or below 70 mg/dL (3.9 mmol/L), take 15 grams of a carb again. If your glucose does not go above 70 mg/dL (3.9 mmol/L) after   3 tries, get help right away. After your glucose goes back to normal, eat a meal or a snack within 1 hour. Treating very low blood glucose If your glucose is at or below 54 mg/dL (3 mmol/L), you have very low blood glucose (severe hypoglycemia). This is an emergency. Do not wait to see if the symptoms will go away. Get medical help right away. Call your local emergency services (911 in the [place] Do not drive yourself to the hospital. Questions to ask your health care provider Should I talk with a diabetes educator? What equipment will I need to care for myself at home? What diabetes medicines do I need? When should I take them? How often do I need to check my blood glucose levels? What number can I call if I have questions? When is my follow-up visit? Where can I find a support group for people with diabetes? Where to find more information American Diabetes Association: www.diabetes.org Association of Diabetes Care and Education Specialists: www.diabeteseducator.org Contact a health care provider if: Your blood glucose is at or above 240 mg/dL (13.3 mmol/L) for 2 days in a row. You have been sick or have had a fever for 2 days or more, and you are not getting better. You have any of these problems for more than 6 hours: You cannot eat or  drink. You feel nauseous. You vomit. You have diarrhea. Get help right away if: Your blood glucose is lower than 54 mg/dL (3 mmol/L). You get confused. You have trouble thinking clearly. You have trouble breathing. These symptoms may represent a serious problem that is an emergency. Do not wait to see if the symptoms will go away. Get medical help right away. Call your local emergency services (911 in the [place] Do not drive yourself to the hospital. Summary Diabetes mellitus is a chronic disease that occurs when the body does not properly use sugar (glucose) that is released from food after you eat. Take insulin and diabetes medicines as told. Check your blood glucose every day, as often as told. Keep all follow-up visits. This is important. This information is not intended to replace advice given to you by your health care provider. Make sure you discuss any questions you have with your health care provider. Document Revised: 04/09/2020 Document Reviewed: 04/09/2020 Elsevier Patient Education  2023 [employer]  

## 2023-05-11 ENCOUNTER — Ambulatory Visit (INDEPENDENT_AMBULATORY_CARE_PROVIDER_SITE_OTHER): Payer: 59 | Admitting: Nurse Practitioner

## 2023-05-11 ENCOUNTER — Encounter: Payer: Self-pay | Admitting: Nurse Practitioner

## 2023-05-11 VITALS — BP 148/78 | HR 73 | Temp 98.4°F | Ht 65.98 in | Wt 219.4 lb

## 2023-05-11 DIAGNOSIS — E1122 Type 2 diabetes mellitus with diabetic chronic kidney disease: Secondary | ICD-10-CM | POA: Diagnosis not present

## 2023-05-11 DIAGNOSIS — N186 End stage renal disease: Secondary | ICD-10-CM

## 2023-05-11 DIAGNOSIS — F3341 Major depressive disorder, recurrent, in partial remission: Secondary | ICD-10-CM | POA: Diagnosis not present

## 2023-05-11 DIAGNOSIS — G894 Chronic pain syndrome: Secondary | ICD-10-CM | POA: Diagnosis not present

## 2023-05-11 DIAGNOSIS — R1012 Left upper quadrant pain: Secondary | ICD-10-CM | POA: Diagnosis not present

## 2023-05-11 NOTE — Assessment & Plan Note (Signed)
Improved at this time, continue to monitor closely.

## 2023-05-11 NOTE — Assessment & Plan Note (Signed)
Chronic, ongoing with recent dialysis A1c 7.4% today, upward trend from previous.  At this time will continue Ozempic with next dose increase 0.5 MG weekly, instructed her on this and use + side effects to alert provider of.  No family history of thyroid cancer (MTC, MEN 2, thyroid cell tumors) or pancreatitis. Continue to lower insulin dosing and monitor sugars closely -- she is aware to MyChart provider if sugars <70 consistently + reduce or stop insulin. Recommend she check blood sugar 3-4 times a day, may benefit from Freestyle in future.  Focus on diabetic diet at home.  Return in July for A1c check.

## 2023-05-11 NOTE — Assessment & Plan Note (Signed)
Followed by rheumatology and pain management, continue these collaborations.  Recent notes reviewed.  Continue medications as prescribed by them. She is aware all opioids must come from pain management. ?

## 2023-05-11 NOTE — Progress Notes (Signed)
BP (!) 148/78 (BP Location: Left Arm, Patient Position: Sitting, Cuff Size: Normal)   Pulse 73   Temp 98.4 F (36.9 C) (Oral)   Ht 5' 5.98" (1.676 m)   Wt 219 lb 6.4 oz (99.5 kg)   SpO2 98%   BMI 35.43 kg/m    Subjective:    Patient ID: [name], female    DOB: 1966/07/22, 57 y.o.   MRN: [id number]  HPI: AHMIYA BAUDER is a 57 y.o. female  Chief Complaint  Patient presents with   Back Pain    Low back pain   LUQ pain    Has gotten better,  does not seem as intense anynore   Diabetes    Started Ozempic at last visit   DIABETES At last visit A1c was 7.4% and we decided to trial Ozempic -- about to increase to 0.5 MG.  Has noticed reduction in appetite with current reduction 12 pounds.  Was some nausea initially, but overall tolerating well. She has reduced Lantus to 10 units at night + has reduced Novolog use -- sometimes not taking.    Has some LUQ pain last visit, overall CT imaging was stable and labs reassuring/at baseline.  Has changed to lower back area. Hypoglycemic episodes:yes -- has reduced insulin dosing Polydipsia/polyuria: no Visual disturbance: no Chest pain: no Paresthesias: no Glucose Monitoring: yes  Accucheck frequency: Daily  Fasting glucose:  Post prandial:  Evening:  Before meals: Taking Insulin?: yes  Long acting insulin: 10 units Lantus  Short acting insulin: Novolog 3 to 5 units Blood Pressure Monitoring: daily Retinal Examination: Up to Date Foot Exam: Up to Date Diabetic Education: Completed Pneumovax: Up to Date Influenza: Up to Date Aspirin: no   BACK PAIN Followed by rheumatology for RA with last infusion on 04/20/23.  Takes OXY IR as needed, prescribed by pain clinic -- last visit 03/18/23. Duration: months -- chronic Mechanism of injury: unknown Location: midline, bilateral, and low back Onset: gradual Severity: 6/10 Quality: dull, aching, and throbbing, heaviness Frequency: intermittent Radiation: none Aggravating  factors: lifting, movement, and bending Alleviating factors: rest and APAP, Oxy Status: fluctuating Treatments attempted: as above  Relief with NSAIDs?: No NSAIDs Taken Nighttime pain:  yes Paresthesias / decreased sensation:  yes Bowel / bladder incontinence:  no Fevers:  no Dysuria / urinary frequency:  no     05/11/2023   10:25 AM 12/17/2022    9:41 AM 09/10/2022   11:06 AM 04/16/2022   10:14 AM 03/18/2022   10:34 AM  Depression screen PHQ 2/9  Decreased Interest 0 0 0 0 3  Down, Depressed, Hopeless 1 0 0 0 1  PHQ - 2 Score 1 0 0 0 4  Altered sleeping 2  3 2 3   Tired, decreased energy 2  1 2 3   Change in appetite 2  0 0 3  Feeling bad or failure about yourself  3  0 0 0  Trouble concentrating 0  0 1 0  Moving slowly or fidgety/restless 2  0 2 1  Suicidal thoughts 0  0 0 0  PHQ-9 Score 12  4 7 14   Difficult doing work/chores Somewhat difficult  Not difficult at all         05/11/2023   10:26 AM 04/16/2022   10:15 AM 03/18/2022   10:34 AM 11/11/2021   10:05 AM  GAD 7 : Generalized Anxiety Score  Nervous, Anxious, on Edge 3 2 0 3  Control/stop worrying 3 2 0 3  Worry too much - different things 3 2 0 3  Trouble relaxing 2 2 0 3  Restless 2 2 0 3  Easily annoyed or irritable 2 2 0 3  Afraid - awful might happen 2 1 0 2  Total GAD 7 Score 17 13 0 20  Anxiety Difficulty Somewhat difficult Somewhat difficult  Somewhat difficult     Relevant past medical, surgical, family and social history reviewed and updated as indicated. Interim medical history since our last visit reviewed. Allergies and medications reviewed and updated.  Review of Systems  Constitutional:  Negative for activity change, appetite change, diaphoresis, fatigue and fever.  Respiratory:  Negative for cough, chest tightness, shortness of breath and wheezing.   Cardiovascular:  Negative for chest pain, palpitations and leg swelling.  Gastrointestinal: Negative.   Endocrine: Negative for cold intolerance,  heat intolerance, polydipsia, polyphagia and polyuria.  Musculoskeletal:  Positive for back pain.  Neurological: Negative.   Psychiatric/Behavioral:  Positive for sleep disturbance. Negative for decreased concentration, self-injury and suicidal ideas. The patient is nervous/anxious.     Per HPI unless specifically indicated above     Objective:    BP (!) 148/78 (BP Location: Left Arm, Patient Position: Sitting, Cuff Size: Normal)   Pulse 73   Temp 98.4 F (36.9 C) (Oral)   Ht 5' 5.98" (1.676 m)   Wt 219 lb 6.4 oz (99.5 kg)   SpO2 98%   BMI 35.43 kg/m   Wt Readings from Last 3 Encounters:  05/11/23 219 lb 6.4 oz (99.5 kg)  04/13/23 231 lb (104.8 kg)  03/18/23 226 lb (102.5 kg)    Physical Exam Vitals and nursing note reviewed.  Constitutional:      General: She is awake. She is not in acute distress.    Appearance: She is well-developed and well-groomed. She is obese. She is not ill-appearing or toxic-appearing.  HENT:     Head: Normocephalic.     Right Ear: Hearing, ear canal and external ear normal.     Left Ear: Hearing, ear canal and external ear normal.  Eyes:     General: Lids are normal.        Right eye: No discharge.        Left eye: No discharge.     Conjunctiva/sclera: Conjunctivae normal.     Pupils: Pupils are equal, round, and reactive to light.  Neck:     Thyroid: No thyromegaly.     Vascular: No carotid bruit.  Cardiovascular:     Rate and Rhythm: Normal rate and regular rhythm.     Heart sounds: Normal heart sounds. No murmur heard.    No gallop.     Arteriovenous access: Left arteriovenous access is present. Pulmonary:     Effort: Pulmonary effort is normal. No accessory muscle usage or respiratory distress.     Breath sounds: Normal breath sounds.  Abdominal:     General: Bowel sounds are normal. There is no distension.     Palpations: Abdomen is soft.  Musculoskeletal:     Cervical back: Normal range of motion and neck supple.     Right lower  leg: No edema.     Left lower leg: No edema.  Lymphadenopathy:     Cervical: No cervical adenopathy.  Skin:    General: Skin is warm and dry.  Neurological:     Mental Status: She is alert and oriented to person, place, and time.  Psychiatric:        Attention and  Perception: Attention normal.        Mood and Affect: Mood normal.        Speech: Speech normal.        Behavior: Behavior normal. Behavior is cooperative.        Thought Content: Thought content normal.     Results for orders placed or performed during the hospital encounter of 04/15/23  Drug Screen 10 W/Conf, Serum  Result Value Ref Range   Amphetamines, IA Negative Cutoff:50 ng/mL   Barbiturates, IA Negative Cutoff:0.1 ug/mL   Benzodiazepines, IA Negative Cutoff:20 ng/mL   Cocaine & Metabolite, IA Negative Cutoff:25 ng/mL   Phencyclidine, IA Negative Cutoff:8 ng/mL   THC(Marijuana) Metabolite, IA Negative Cutoff:5 ng/mL   Opiates, IA Negative Cutoff:5 ng/mL   Oxycodones, IA ++POSITIVE++ (A) Cutoff:5 ng/mL   Methadone, IA Negative Cutoff:25 ng/mL   Propoxyphene, IA Negative Cutoff:50 ng/mL  Oxycodones,MS,WB/Sp Rfx  Result Value Ref Range   Oxycodones Confirmation Positive    Oxycocone 8.9 ng/mL   Oxymorphone Negative ng/mL      Assessment & Plan:   Problem List Items Addressed This Visit       Endocrine   Type 2 diabetes mellitus with ESRD (end-stage renal disease) (HCC) - Primary    Chronic, ongoing with recent dialysis A1c 7.4% today, upward trend from previous.  At this time will continue Ozempic with next dose increase 0.5 MG weekly, instructed her on this and use + side effects to alert provider of.  No family history of thyroid cancer (MTC, MEN 2, thyroid cell tumors) or pancreatitis. Continue to lower insulin dosing and monitor sugars closely -- she is aware to MyChart provider if sugars <70 consistently + reduce or stop insulin. Recommend she check blood sugar 3-4 times a day, may benefit from Freestyle  in future.  Focus on diabetic diet at home.  Return in July for A1c check.        Other   Chronic pain syndrome    Followed by rheumatology and pain management, continue these collaborations.  Recent notes reviewed.  Continue medications as prescribed by them. She is aware all opioids must come from pain management.      LUQ pain    Improved at this time, continue to monitor closely.      Recurrent major depression in partial remission (HCC)    Chronic, ongoing. Denies SI/HI.  She does not want maintenance medication.  Would avoid benzos due to her opioid use for pain, discussed at length with her.  She would benefit from a maintenance regimen in future that is renal/liver dosed, but declines today.          Follow up plan: Return in about 2 months (around 07/14/2023) for T2DM, HTN/HLD, MOOD, CKD.

## 2023-05-11 NOTE — Assessment & Plan Note (Signed)
Chronic, ongoing. Denies SI/HI.  She does not want maintenance medication.  Would avoid benzos due to her opioid use for pain, discussed at length with her.  She would benefit from a maintenance regimen in future that is renal/liver dosed, but declines today.   

## 2023-05-21 ENCOUNTER — Other Ambulatory Visit: Payer: Self-pay | Admitting: Nurse Practitioner

## 2023-05-21 DIAGNOSIS — J00 Acute nasopharyngitis [common cold]: Secondary | ICD-10-CM

## 2023-05-21 NOTE — Telephone Encounter (Signed)
Requested Prescriptions  Pending Prescriptions Disp Refills   levocetirizine (XYZAL) 5 MG tablet [Pharmacy Med Name: LEVOCETIRIZINE 5 MG TABLET] 90 tablet 3    Sig: TAKE 1 TABLET BY MOUTH EVERY DAY IN THE EVENING     Ear, Nose, and Throat:  Antihistamines - levocetirizine dihydrochloride Failed - 05/21/2023  2:31 AM      Failed - Cr in normal range and within 360 days    Creat  Date Value Ref Range Status  12/27/2017 7.43 (H) 0.50 - 1.05 mg/dL Final    Comment:    For patients >69 years of age, the reference limit for Creatinine is approximately 13% higher for people identified as African-American. .    Creatinine, Ser  Date Value Ref Range Status  04/13/2023 6.96 (H) 0.57 - 1.00 mg/dL Final         Failed - eGFR is 10 or above and within 360 days    GFR, Est African American  Date Value Ref Range Status  12/27/2017 7 (L) > OR = 60 mL/min/1.62m2 Final   GFR calc Af Amer  Date Value Ref Range Status  05/01/2019 6 (L) >60 mL/min Final   GFR, Est Non African American  Date Value Ref Range Status  12/27/2017 6 (L) > OR = 60 mL/min/1.42m2 Final   GFR calc non Af Amer  Date Value Ref Range Status  05/01/2019 6 (L) >60 mL/min Final   eGFR  Date Value Ref Range Status  04/13/2023 6 (L) >59 mL/min/1.73 Final         Passed - Valid encounter within last 12 months    Recent Outpatient Visits           1 week ago Type 2 diabetes mellitus with ESRD (end-stage renal disease) (HCC)   [place] [facility] [place], Jolene T, NP   1 month ago Type 2 diabetes mellitus with ESRD (end-stage renal disease) (HCC)   [place] [facility] [place], Jolene T, NP   8 months ago Diabetic polyneuropathy associated with type 2 diabetes mellitus (HCC)   [place] [facility] [place], Corrie Dandy T, NP   1 year ago Hypertension associated with diabetes (HCC)   [place] [facility] [place], [place] T, NP   1 year ago Type 2 diabetes  mellitus with ESRD (end-stage renal disease) (HCC)   [place] [facility] [place], Dorie Rank, NP       Future Appointments             In 1 month Cannady, Dorie Rank, NP [place] [facility], [facility]

## 2023-05-24 ENCOUNTER — Other Ambulatory Visit: Payer: Self-pay | Admitting: Nurse Practitioner

## 2023-05-25 ENCOUNTER — Other Ambulatory Visit: Payer: Self-pay | Admitting: Nurse Practitioner

## 2023-05-25 DIAGNOSIS — N186 End stage renal disease: Secondary | ICD-10-CM

## 2023-05-25 NOTE — Telephone Encounter (Signed)
Requested Prescriptions  Pending Prescriptions Disp Refills   [name] test strip [Pharmacy Med Name: ONE TOUCH ULTRA BLUE TEST STRP] 100 strip 11    Sig: USE TO CHECK BLOOD SUGAR 3 TIMES DAILY, FASTING IN MORNING WITH GOAL <130 AND 2 HOURS AFTER MEALS WITH GOAL <180. BRING BLOOD SUGAR LOG TO VISITS.     Endocrinology: Diabetes - Testing Supplies Passed - 05/24/2023  2:18 AM      Passed - Valid encounter within last 12 months    Recent Outpatient Visits           2 weeks ago Type 2 diabetes mellitus with ESRD (end-stage renal disease) (HCC)   [place] [facility] [place], Jolene T, NP   1 month ago Type 2 diabetes mellitus with ESRD (end-stage renal disease) (HCC)   [place] [facility] [place], Jolene T, NP   8 months ago Diabetic polyneuropathy associated with type 2 diabetes mellitus (HCC)   [place] [facility] [place], Corrie Dandy T, NP   1 year ago Hypertension associated with diabetes (HCC)   [place] [facility] [place], [place] T, NP   1 year ago Type 2 diabetes mellitus with ESRD (end-stage renal disease) (HCC)   [place] [facility] [place], Dorie Rank, NP       Future Appointments             In 1 month Cannady, Dorie Rank, NP [place] [facility], [facility]

## 2023-05-25 NOTE — Telephone Encounter (Signed)
Left a message for the patient to call back if she is taking the Zetia and to schedule a follow up as she is past due. According to epic, PCP is following her lipids and wants the patient to start Repatha.

## 2023-05-25 NOTE — Telephone Encounter (Signed)
Requested Prescriptions  Pending Prescriptions Disp Refills   insulin glargine (LANTUS) 100 UNIT/ML injection [Pharmacy Med Name: LANTUS 100 UNIT/ML VIAL] 10 mL 0    Sig: INJECT 15 UNITS INTO THE SKIN DAILY.     Endocrinology:  Diabetes - Insulins Passed - 05/25/2023  9:49 AM      Passed - HBA1C is between 0 and 7.9 and within 180 days    Hemoglobin A1C  Date Value Ref Range Status  07/20/2021 8.2  Final    Comment:    [facility]   HB A1C (BAYER DCA - WAIVED)  Date Value Ref Range Status  04/13/2023 7.4 (H) 4.8 - 5.6 % Final    Comment:             Prediabetes: 5.7 - 6.4          Diabetes: >6.4          Glycemic control for adults with diabetes: <7.0          Passed - Valid encounter within last 6 months    Recent Outpatient Visits           2 weeks ago Type 2 diabetes mellitus with ESRD (end-stage renal disease) (HCC)   [place] [facility] [place], Jolene T, NP   1 month ago Type 2 diabetes mellitus with ESRD (end-stage renal disease) (HCC)   [place] [facility] [place], Jolene T, NP   8 months ago Diabetic polyneuropathy associated with type 2 diabetes mellitus (HCC)   [place] [facility] [place], Corrie Dandy T, NP   1 year ago Hypertension associated with diabetes (HCC)   [place] [facility] [place], [place] T, NP   1 year ago Type 2 diabetes mellitus with ESRD (end-stage renal disease) (HCC)   [place] [facility] [place], Dorie Rank, NP       Future Appointments             In 1 month Cannady, Kelford T, NP [place] [facility]             omeprazole (PRILOSEC) 40 MG capsule [Pharmacy Med Name: OMEPRAZOLE DR 40 MG CAPSULE] 90 capsule 3    Sig: TAKE 1 CAPSULE (40 MG TOTAL) BY MOUTH DAILY.     Gastroenterology: Proton Pump Inhibitors Passed - 05/25/2023  9:49 AM      Passed - Valid encounter within last 12 months    Recent Outpatient Visits           2  weeks ago Type 2 diabetes mellitus with ESRD (end-stage renal disease) (HCC)   [place] [facility] [place], Jolene T, NP   1 month ago Type 2 diabetes mellitus with ESRD (end-stage renal disease) (HCC)   [place] [facility] [place], Jolene T, NP   8 months ago Diabetic polyneuropathy associated with type 2 diabetes mellitus (HCC)   [place] [facility] [place], Corrie Dandy T, NP   1 year ago Hypertension associated with diabetes (HCC)   [place] [facility] [place], Frazer T, NP   1 year ago Type 2 diabetes mellitus with ESRD (end-stage renal disease) (HCC)   [place] [facility] [place], Dorie Rank, NP       Future Appointments             In 1 month Cannady, Dorie Rank, NP  [facility], [facility]  diltiazem (CARDIZEM) 90 MG tablet [Pharmacy Med Name: DILTIAZEM 90 MG TABLET] 360 tablet 0    Sig: TAKE 1 TABLET (90 MG TOTAL) BY MOUTH 4 (FOUR) TIMES DAILY.     Cardiovascular: Calcium Channel Blockers 3 Failed - 05/25/2023  9:49 AM      Failed - Cr in normal range and within 360 days    Creat  Date Value Ref Range Status  12/27/2017 7.43 (H) 0.50 - 1.05 mg/dL Final    Comment:    For patients >52 years of age, the reference limit for Creatinine is approximately 13% higher for people identified as African-American. .    Creatinine, Ser  Date Value Ref Range Status  04/13/2023 6.96 (H) 0.57 - 1.00 mg/dL Final         Failed - Last BP in normal range    BP Readings from Last 1 Encounters:  05/11/23 (!) 148/78         Passed - ALT in normal range and within 360 days    ALT  Date Value Ref Range Status  04/13/2023 19 0 - 32 IU/L Final   SGPT (ALT)  Date Value Ref Range Status  10/15/2014 54 U/L Final    Comment:    14-63 NOTE: New Reference Range 07/10/14    ALT (SGPT) P5P  Date Value Ref Range Status  07/11/2019 28 0 - 40 IU/L Final         Passed - AST in  normal range and within 360 days    AST  Date Value Ref Range Status  04/13/2023 18 0 - 40 IU/L Final   SGOT(AST)  Date Value Ref Range Status  10/15/2014 46 (H) 15 - 37 Unit/L Final         Passed - Last Heart Rate in normal range    Pulse Readings from Last 1 Encounters:  05/11/23 73         Passed - Valid encounter within last 6 months    Recent Outpatient Visits           2 weeks ago Type 2 diabetes mellitus with ESRD (end-stage renal disease) (HCC)   [place] [facility] [place], Jolene T, NP   1 month ago Type 2 diabetes mellitus with ESRD (end-stage renal disease) (HCC)   [place] [facility] [place], Jolene T, NP   8 months ago Diabetic polyneuropathy associated with type 2 diabetes mellitus (HCC)   [place] [facility] [place], Corrie Dandy T, NP   1 year ago Hypertension associated with diabetes (HCC)   [place] [facility] [place], [place] T, NP   1 year ago Type 2 diabetes mellitus with ESRD (end-stage renal disease) (HCC)   [place] [facility] [place], Dorie Rank, NP       Future Appointments             In 1 month Cannady, Dorie Rank, NP [place] [facility], [facility]

## 2023-05-25 NOTE — Telephone Encounter (Signed)
[name], please advise on refill request  Patient last seen by Ward Givens on 06/30/2022.  Patient reported 'not taking" and Zetia was discontinued on med list. F/U plan was for 6 to 8 weeks to reassess blood pressure.  Appt on 08/20/22 was cancelled with reason: Patient (doesn't have a ride, will call back to reschedule)

## 2023-05-26 ENCOUNTER — Encounter: Payer: Self-pay | Admitting: Nurse Practitioner

## 2023-05-27 MED ORDER — DILTIAZEM HCL 30 MG PO TABS: 90.0000 mg | ORAL_TABLET | Freq: Four times a day (QID) | ORAL | 12 refills | Status: DC

## 2023-06-01 ENCOUNTER — Telehealth: Payer: Self-pay | Admitting: Cardiovascular Disease

## 2023-06-01 NOTE — Telephone Encounter (Signed)
Pt called reporting issues with her blood pressure fluctuating. She stated while sitting, her blood pressure runs high but will drop when she stands. She reported during that time, she will become very dizzy and develop blurred vision.   Current BP 195/84  Pt also reported yesterday after dialysis, her BP dropped to 90/50. She stated she did not take blood pressure medications prior. However, she reported the machine clotted off and wasn't able to return some of her blood.  Appointment scheduled for next Tuesday 7/16. Pt made aware of ED precaution should any new symptoms develop or worsen.   Will also forward to NP for further recommendations

## 2023-06-01 NOTE — Telephone Encounter (Signed)
Pt c/o BP issue: STAT if pt c/o blurred vision, one-sided weakness or slurred speech  1. What are your last 5 BP readings?    90/50 yesterday around 10:30 am  2. Are you having any other symptoms (ex. Dizziness, headache, blurred vision, passed out)?   Dizziness, headache, blurred  3. What is your BP issue?   Patient stated her BP has been dropping low.  Patient noted she had not taken any BP pills since the night before.

## 2023-06-01 NOTE — Telephone Encounter (Signed)
Pt made aware of NP recommendations and stated nephrologist has already been made aware.

## 2023-06-01 NOTE — Telephone Encounter (Signed)
Pls ensure that she has notified her nephrologist as there is a correlation between her orthostasis and volume mgmt @ HD.

## 2023-06-08 ENCOUNTER — Ambulatory Visit
Payer: 59 | Attending: Student in an Organized Health Care Education/Training Program | Admitting: Student in an Organized Health Care Education/Training Program

## 2023-06-08 ENCOUNTER — Ambulatory Visit: Payer: 59 | Attending: Medical | Admitting: Medical

## 2023-06-08 ENCOUNTER — Encounter: Payer: Self-pay | Admitting: Medical

## 2023-06-08 ENCOUNTER — Ambulatory Visit: Payer: 59

## 2023-06-08 ENCOUNTER — Encounter: Payer: Self-pay | Admitting: Student in an Organized Health Care Education/Training Program

## 2023-06-08 ENCOUNTER — Telehealth: Payer: Self-pay | Admitting: Medical

## 2023-06-08 VITALS — BP 174/79 | HR 77 | Temp 97.5°F | Resp 16 | Ht 65.0 in | Wt 213.0 lb

## 2023-06-08 VITALS — BP 157/83 | HR 73 | Ht 65.0 in | Wt 213.4 lb

## 2023-06-08 DIAGNOSIS — I10 Essential (primary) hypertension: Secondary | ICD-10-CM | POA: Diagnosis not present

## 2023-06-08 DIAGNOSIS — E782 Mixed hyperlipidemia: Secondary | ICD-10-CM

## 2023-06-08 DIAGNOSIS — M4722 Other spondylosis with radiculopathy, cervical region: Secondary | ICD-10-CM | POA: Diagnosis not present

## 2023-06-08 DIAGNOSIS — Z7985 Long-term (current) use of injectable non-insulin antidiabetic drugs: Secondary | ICD-10-CM

## 2023-06-08 DIAGNOSIS — I340 Nonrheumatic mitral (valve) insufficiency: Secondary | ICD-10-CM

## 2023-06-08 DIAGNOSIS — M4726 Other spondylosis with radiculopathy, lumbar region: Secondary | ICD-10-CM

## 2023-06-08 DIAGNOSIS — G894 Chronic pain syndrome: Secondary | ICD-10-CM | POA: Diagnosis present

## 2023-06-08 DIAGNOSIS — M47812 Spondylosis without myelopathy or radiculopathy, cervical region: Secondary | ICD-10-CM | POA: Diagnosis present

## 2023-06-08 DIAGNOSIS — R42 Dizziness and giddiness: Secondary | ICD-10-CM | POA: Diagnosis not present

## 2023-06-08 DIAGNOSIS — E1142 Type 2 diabetes mellitus with diabetic polyneuropathy: Secondary | ICD-10-CM

## 2023-06-08 DIAGNOSIS — I2510 Atherosclerotic heart disease of native coronary artery without angina pectoris: Secondary | ICD-10-CM | POA: Diagnosis not present

## 2023-06-08 DIAGNOSIS — M47816 Spondylosis without myelopathy or radiculopathy, lumbar region: Secondary | ICD-10-CM | POA: Insufficient documentation

## 2023-06-08 DIAGNOSIS — R55 Syncope and collapse: Secondary | ICD-10-CM

## 2023-06-08 DIAGNOSIS — Z992 Dependence on renal dialysis: Secondary | ICD-10-CM

## 2023-06-08 DIAGNOSIS — M5416 Radiculopathy, lumbar region: Secondary | ICD-10-CM | POA: Insufficient documentation

## 2023-06-08 DIAGNOSIS — M65342 Trigger finger, left ring finger: Secondary | ICD-10-CM

## 2023-06-08 DIAGNOSIS — I951 Orthostatic hypotension: Secondary | ICD-10-CM

## 2023-06-08 DIAGNOSIS — M5412 Radiculopathy, cervical region: Secondary | ICD-10-CM | POA: Insufficient documentation

## 2023-06-08 DIAGNOSIS — N186 End stage renal disease: Secondary | ICD-10-CM

## 2023-06-08 MED ORDER — OXYCODONE HCL 5 MG PO TABS
5.0000 mg | ORAL_TABLET | Freq: Three times a day (TID) | ORAL | 0 refills | Status: DC | PRN
Start: 2023-07-24 — End: 2023-09-16

## 2023-06-08 MED ORDER — OXYCODONE HCL 5 MG PO TABS
5.0000 mg | ORAL_TABLET | Freq: Three times a day (TID) | ORAL | 0 refills | Status: DC | PRN
Start: 2023-08-23 — End: 2023-09-16

## 2023-06-08 MED ORDER — OXYCODONE HCL 5 MG PO TABS
5.0000 mg | ORAL_TABLET | Freq: Three times a day (TID) | ORAL | 0 refills | Status: DC | PRN
Start: 2023-06-24 — End: 2023-09-16

## 2023-06-08 NOTE — Progress Notes (Signed)
PROVIDER NOTE: Information contained herein reflects review and annotations entered in association with encounter. Interpretation of such information and data should be left to medically-trained personnel. Information provided to patient can be located elsewhere in the medical record under "Patient Instructions". Document created using STT-dictation technology, any transcriptional errors that may result from process are unintentional.    Patient: [name]  Service Category: E/M  Provider: Edward Jolly, MD  DOB: Jul 06, 1966  DOS: 06/08/2023  Specialty: Interventional Pain Management  MRN: [id number]  Setting: Ambulatory outpatient  PCP: Marjie Skiff, NP  Type: Established Patient    Referring Provider: Marjie Skiff, NP  Location: Office  Delivery: Face-to-face     HPI  [name], a 57 y.o. year old female, is here today because of her Diabetic polyneuropathy associated with type 2 diabetes mellitus (HCC) [E11.42]. Ms. Edouard primary complain today is Back Pain and Neck Pain Last encounter: My last encounter with her was on 03/18/2023 Pertinent problems: Ms. Stawicki has ESRD (end stage renal disease) on dialysis [facility]); Type 2 diabetes mellitus with ESRD (end-stage renal disease) (HCC); Chronic bilateral low back pain; DDD (degenerative disc disease), cervical; Spondylosis of cervical region without myelopathy or radiculopathy; PAD (peripheral artery disease) (HCC); Chronic pain syndrome; Diabetic polyneuropathy associated with type 2 diabetes mellitus (HCC); Cervical radiculopathy; Lumbar radiculopathy; GAD (generalized anxiety disorder); and Recurrent major depression in partial remission (HCC) on their pertinent problem list. Pain Assessment: Severity of Chronic pain is reported as a 8 /10. Location: Back Lower/denies. Onset: More than a month ago. Quality: Aching, Dull. Timing: Constant. Modifying factor(s): medication, hot shower. Vitals:  height is 5\' 5"  (1.651 m) and weight is 213  lb (96.6 kg). Her temperature is 97.5 F (36.4 C) (abnormal). Her blood pressure is 174/79 (abnormal) and her pulse is 77. Her respiration is 16 and oxygen saturation is 96%.   Reason for encounter: medication management.   Patient states that she has lost approximately 25 pounds since starting Ozempic.  She states that she has also reduced her blood pressure medications since losing weight.  She states that she is also monitoring her diet and eating more healthy foods.  She continues oxycodone as prescribed, no change in dose.  She continues with dialysis as well.    Pharmacotherapy Assessment  Analgesic: Oxycodone 5 mg q8 hrs prn quantity 75/month; MME equals 22.5    Monitoring: [place] PMP: PDMP reviewed during this encounter.       Pharmacotherapy: No side-effects or adverse reactions reported. Compliance: No problems identified. Effectiveness: Clinically acceptable.  Valerie Salts, RN  06/08/2023 11:14 AM  Sign when Signing Visit Nursing Pain Medication Assessment:  Safety precautions to be maintained throughout the outpatient stay will include: orient to surroundings, keep bed in low position, maintain call bell within reach at all times, provide assistance with transfer out of bed and ambulation.  Medication Inspection Compliance: Pill count conducted under aseptic conditions, in front of the patient. Neither the pills nor the bottle was removed from the patient's sight at any time. Once count was completed pills were immediately returned to the patient in their original bottle.  Medication: Oxycodone IR Pill/Patch Count:  40 of 75 pills remain Pill/Patch Appearance: Markings consistent with prescribed medication Bottle Appearance: Standard pharmacy container. Clearly labeled. Filled Date: 06 / 04 / 2024 Last Medication intake:  Yesterday     UDS:  No results found for: "SUMMARY"   ROS  Constitutional: Denies any fever or chills Gastrointestinal: No  reported hemesis, hematochezia,  vomiting, or acute GI distress Musculoskeletal:  low back pain, bilateral hand pain, parasthesias, and swelling Neurological: No reported episodes of acute onset apraxia, aphasia, dysarthria, agnosia, amnesia, paralysis, loss of coordination, or loss of consciousness  Medication Review  Abatacept, FQ Protective Underwear, Insulin Syringe-Needle U-100, Semaglutide(0.25 or 0.5MG /DOS), Vitamin D (Ergocalciferol), Vitamin D3, brimonidine, ciclopirox, diltiazem, dorzolamidel-timolol, glucose blood, insulin aspart, insulin glargine, latanoprost, leflunomide, levocetirizine, lidocaine, lidocaine-prilocaine, midodrine, omeprazole, oxyCODONE, and promethazine  History Review  Allergy: Ms. Ozanich is allergic to cinnamon, garlic, onion, tylenol [acetaminophen], losartan potassium, amlodipine, ciprofloxacin, clonidine derivatives, ginger, hydralazine, lisinopril, metoprolol, prednisone, and statins. Drug: Ms. Younghans  reports no history of drug use. Alcohol:  reports no history of alcohol use. Tobacco:  reports that she quit smoking about 21 months ago. Her smoking use included cigarettes. She has a 5.00 pack-year smoking history. She has never used smokeless tobacco. Social: Ms. Kanaley  reports that she quit smoking about 21 months ago. Her smoking use included cigarettes. She has a 5.00 pack-year smoking history. She has never used smokeless tobacco. She reports that she does not drink alcohol and does not use drugs. Medical:  has a past medical history of Allergy, Anemia, Anxiety, Arthritis, Ataxia, Chest pain, COPD (chronic obstructive pulmonary disease) (HCC), Depression, Diabetes mellitus with complication (HCC), Diastolic dysfunction, ESRD on hemodialysis (HCC), Fistula, Hepatitis (2003), Hiatal hernia, Hypercholesterolemia, Hypertension, Migraine, Mitral regurgitation, Neuropathy, diabetic (HCC), Non-alcoholic cirrhosis (HCC), Palpitations, Peripheral vascular disease (HCC), Pneumonia (2015), Psoriasis, Tobacco  dependence, and Wears dentures. Surgical: Ms. Kihm  has a past surgical history that includes Tonsillectomy; rt. tubal and ovary removed; Cholecystectomy; Cardiac catheterization (N/A, 07/25/2015); Cardiac catheterization (Left, 07/25/2015); Cardiac catheterization (Left, 10/07/2015); Cardiac catheterization (N/A, 10/07/2015); Cardiac catheterization (10/07/2015); Cardiac catheterization (N/A, 12/17/2015); Incision and drainage abscess (N/A, 07/16/2016); cyst removed  from left hand (Left, 1989); AV fistula placement (Left, 11/2014); Cardiac catheterization (N/A, 01/11/2017); Cardiac catheterization (N/A, 01/11/2017); DIALYSIS/PERMA CATHETER INSERTION (N/A, 05/20/2017); Cesarean section; Colonoscopy (N/A, 09/22/2017); Esophagogastroduodenoscopy (N/A, 09/22/2017); polypectomy (09/22/2017); A/V SHUNT INTERVENTION (N/A, 12/16/2017); STENT PLACEMENT VASCULAR ([facility] HX) (Left, 03/2018); Upper Extremity Angiography (Left, 09/29/2019); Aqueous shunt (Left, 12/20/2019); Colonoscopy with propofol (N/A, 04/16/2020); A/V Fistulagram (Left, 04/23/2020); Esophagogastroduodenoscopy (egd) with propofol (N/A, 12/04/2021); and Eye surgery (Right, 09/02/2022). Family: family history includes Heart disease in her mother; Hypertension in her father and mother; Skin cancer in her father.  Laboratory Chemistry Profile   Renal Lab Results  Component Value Date   BUN 55 (H) 04/13/2023   CREATININE 6.96 (H) 04/13/2023   BCR 8 (L) 04/13/2023   GFRAA 6 (L) 05/01/2019   GFRNONAA 6 (L) 05/01/2019    Hepatic Lab Results  Component Value Date   AST 18 04/13/2023   ALT 19 04/13/2023   ALBUMIN 4.3 04/13/2023   ALKPHOS 104 04/13/2023   HCVAB >11.0 (H) 12/16/2016   LIPASE 21 04/30/2019    Electrolytes Lab Results  Component Value Date   NA 139 04/13/2023   K 5.2 04/13/2023   CL 99 04/13/2023   CALCIUM 8.7 04/13/2023   MG 1.9 09/10/2022   PHOS 4.2 05/01/2019    Bone Lab Results  Component Value Date   VD25OH 11.2  (L) 04/13/2023    Inflammation (CRP: Acute Phase) (ESR: Chronic Phase) Lab Results  Component Value Date   CRP <0.8 12/16/2016   ESRSEDRATE 117 (H) 10/12/2017   LATICACIDVEN 1.6 04/30/2019         Note: Above Lab results reviewed.  Recent Imaging Review  CT Abdomen Pelvis Wo  Contrast CLINICAL DATA:  Left upper quadrant pain several days. Weight gain. Cirrhosis and end-stage renal disease.  EXAM: CT ABDOMEN AND PELVIS WITHOUT CONTRAST  TECHNIQUE: Multidetector CT imaging of the abdomen and pelvis was performed following the standard protocol without IV contrast.  RADIATION DOSE REDUCTION: This exam was performed according to the departmental dose-optimization program which includes automated exposure control, adjustment of the mA and/or kV according to patient size and/or use of iterative reconstruction technique.  COMPARISON:  07/14/2019  FINDINGS: Lower chest: No acute findings.  Hepatobiliary: No mass visualized on this unenhanced exam. Prior cholecystectomy. No evidence of biliary obstruction.  Pancreas: No mass or inflammatory process visualized on this unenhanced exam.  Spleen:  Within normal limits in size.  Adrenals/Urinary tract: Stable diffuse bilateral renal atrophy, consistent with end-stage renal disease. Extensive bilateral renal vascular calcification also seen. No evidence of ureteral calculi or hydronephrosis.  Stomach/Bowel: No evidence of obstruction, inflammatory process, or abnormal fluid collections. Normal appendix visualized. Diverticulosis is seen mainly involving the descending and sigmoid colon, however there is no evidence of diverticulitis.  Vascular/Lymphatic: No pathologically enlarged lymph nodes identified. No evidence of abdominal aortic aneurysm. Aortic atherosclerotic calcification incidentally noted. Extensive visceral arterial atherosclerotic calcification also seen.  Reproductive:  No mass or other significant  abnormality.  Other:  None.  Musculoskeletal:  No suspicious bone lesions identified.  IMPRESSION: No acute findings.  Colonic diverticulosis, without radiographic evidence of diverticulitis.  Stable diffuse bilateral renal atrophy, consistent with end-stage renal disease. No evidence of ureteral calculi or hydronephrosis.  Electronically Signed   By: Danae Orleans M.D.   On: 05/04/2023 11:16 Note: Reviewed        Physical Exam  General appearance: Well nourished, well developed, and well hydrated. In no apparent acute distress Mental status: Alert, oriented x 3 (person, place, & time)       Respiratory: No evidence of acute respiratory distress Eyes: PERLA Vitals: BP (!) 174/79   Pulse 77   Temp (!) 97.5 F (36.4 C)   Resp 16   Ht 5\' 5"  (1.651 m)   Wt 213 lb (96.6 kg)   SpO2 96%   BMI 35.45 kg/m  BMI: Estimated body mass index is 35.45 kg/m as calculated from the following:   Height as of this encounter: 5\' 5"  (1.651 m).   Weight as of this encounter: 213 lb (96.6 kg). Ideal: Ideal body weight: 57 kg (125 lb 10.6 oz) Adjusted ideal body weight: 72.8 kg (160 lb 9.6 oz)  Cervical Spine Area Exam  Skin & Axial Inspection: No masses, redness, edema, swelling, or associated skin lesions Alignment: Symmetrical Functional ROM: Decreased ROM, to the right Stability: No instability detected Muscle Tone/Strength: Functionally intact. No obvious neuro-muscular anomalies detected. Sensory (Neurological): Dermatomal pain pattern RIGHT C 5,6 Palpation: No palpable anomalies               Thoracic Spine Area Exam  Skin & Axial Inspection: No masses, redness, or swelling Alignment: Symmetrical Functional ROM: Unrestricted ROM Stability: No instability detected Muscle Tone/Strength: Functionally intact. No obvious neuro-muscular anomalies detected. Sensory (Neurological): Unimpaired Muscle strength & Tone: No palpable anomalies  Lumbar Spine Area Exam  Skin & Axial  Inspection: No masses, redness, or swelling Alignment: Symmetrical Functional ROM: Pain restricted ROM       Stability: No instability detected Muscle Tone/Strength: Functionally intact. No obvious neuro-muscular anomalies detected. Sensory (Neurological): Musculoskeletal pain pattern  Gait & Posture Assessment  Ambulation: Patient ambulates using a cane Gait: Relatively  normal for age and body habitus Posture: WNL  Lower Extremity Exam    Side: Right lower extremity  Side: Left lower extremity  Stability: No instability observed          Stability: No instability observed          Skin & Extremity Inspection: Skin color, temperature, and hair growth are WNL. No peripheral edema or cyanosis. No masses, redness, swelling, asymmetry, or associated skin lesions. No contractures.  Skin & Extremity Inspection: Skin color, temperature, and hair growth are WNL. No peripheral edema or cyanosis. No masses, redness, swelling, asymmetry, or associated skin lesions. No contractures.  Functional ROM: Unrestricted ROM                  Functional ROM: Unrestricted ROM                  Muscle Tone/Strength: Functionally intact. No obvious neuro-muscular anomalies detected.  Muscle Tone/Strength: Functionally intact. No obvious neuro-muscular anomalies detected.  Sensory (Neurological): Neuropathic pain pattern        Sensory (Neurological): Neuropathic pain pattern        DTR: Patellar: deferred today Achilles: deferred today Plantar: deferred today  DTR: Patellar: deferred today Achilles: deferred today Plantar: deferred today  Palpation: No palpable anomalies  Palpation: No palpable anomalies    Assessment   Diagnosis   1. Diabetic polyneuropathy associated with type 2 diabetes mellitus (HCC)   2. Cervical radiculopathy   3. Lumbar spondylosis   4. Lumbar radiculopathy   5. Spondylosis of cervical region without myelopathy or radiculopathy   6. Trigger finger, left ring finger   7. Chronic  pain syndrome          Plan of Care    [name] has a current medication list which includes the following long-term medication(s): orencia, novolog, insulin glargine, leflunomide, levocetirizine, omeprazole, promethazine, [START ON 06/24/2023] oxycodone, [START ON 07/24/2023] oxycodone, and [START ON 08/23/2023] oxycodone.   1. Diabetic polyneuropathy associated with type 2 diabetes mellitus (HCC) - oxyCODONE (OXY IR/ROXICODONE) 5 MG immediate release tablet; Take 1 tablet (5 mg total) by mouth every 8 (eight) hours as needed for severe pain. Must last 30 days.  Dispense: 75 tablet; Refill: 0 - oxyCODONE (OXY IR/ROXICODONE) 5 MG immediate release tablet; Take 1 tablet (5 mg total) by mouth every 8 (eight) hours as needed for severe pain. Must last 30 days.  Dispense: 75 tablet; Refill: 0 - oxyCODONE (OXY IR/ROXICODONE) 5 MG immediate release tablet; Take 1 tablet (5 mg total) by mouth every 8 (eight) hours as needed for severe pain. Must last 30 days.  Dispense: 75 tablet; Refill: 0  2. Cervical radiculopathy - oxyCODONE (OXY IR/ROXICODONE) 5 MG immediate release tablet; Take 1 tablet (5 mg total) by mouth every 8 (eight) hours as needed for severe pain. Must last 30 days.  Dispense: 75 tablet; Refill: 0 - oxyCODONE (OXY IR/ROXICODONE) 5 MG immediate release tablet; Take 1 tablet (5 mg total) by mouth every 8 (eight) hours as needed for severe pain. Must last 30 days.  Dispense: 75 tablet; Refill: 0 - oxyCODONE (OXY IR/ROXICODONE) 5 MG immediate release tablet; Take 1 tablet (5 mg total) by mouth every 8 (eight) hours as needed for severe pain. Must last 30 days.  Dispense: 75 tablet; Refill: 0  3. Lumbar spondylosis  4. Lumbar radiculopathy  5. Spondylosis of cervical region without myelopathy or radiculopathy  6. Trigger finger, left ring finger  7. Chronic pain syndrome -  oxyCODONE (OXY IR/ROXICODONE) 5 MG immediate release tablet; Take 1 tablet (5 mg total) by mouth every 8  (eight) hours as needed for severe pain. Must last 30 days.  Dispense: 75 tablet; Refill: 0 - oxyCODONE (OXY IR/ROXICODONE) 5 MG immediate release tablet; Take 1 tablet (5 mg total) by mouth every 8 (eight) hours as needed for severe pain. Must last 30 days.  Dispense: 75 tablet; Refill: 0 - oxyCODONE (OXY IR/ROXICODONE) 5 MG immediate release tablet; Take 1 tablet (5 mg total) by mouth every 8 (eight) hours as needed for severe pain. Must last 30 days.  Dispense: 75 tablet; Refill: 0    -Continue with TENs unit -Discussed exercise and encouraged her to walk more. -Repeat cervical ESI as needed cervical radicular pain flare.    Pharmacotherapy (Medications Ordered): Meds ordered this encounter  Medications   oxyCODONE (OXY IR/ROXICODONE) 5 MG immediate release tablet    Sig: Take 1 tablet (5 mg total) by mouth every 8 (eight) hours as needed for severe pain. Must last 30 days.    Dispense:  75 tablet    Refill:  0    Chronic Pain. (STOP Act - Not applicable). Fill one day early if closed on scheduled refill date.   oxyCODONE (OXY IR/ROXICODONE) 5 MG immediate release tablet    Sig: Take 1 tablet (5 mg total) by mouth every 8 (eight) hours as needed for severe pain. Must last 30 days.    Dispense:  75 tablet    Refill:  0    Chronic Pain. (STOP Act - Not applicable). Fill one day early if closed on scheduled refill date.   oxyCODONE (OXY IR/ROXICODONE) 5 MG immediate release tablet    Sig: Take 1 tablet (5 mg total) by mouth every 8 (eight) hours as needed for severe pain. Must last 30 days.    Dispense:  75 tablet    Refill:  0    Chronic Pain. (STOP Act - Not applicable). Fill one day early if closed on scheduled refill date.   Orders:  No orders of the defined types were placed in this encounter.  Follow-up plan:   Return in about 15 weeks (around 09/21/2023) for Medication Management, in person.     Recent Visits Date Type Provider Dept  03/18/23 Office Visit Edward Jolly,  MD [facility]  Showing recent visits within past 90 days and meeting all other requirements Today's Visits Date Type Provider Dept  06/08/23 Office Visit Edward Jolly, MD [facility]  Showing today's visits and meeting all other requirements Future Appointments No visits were found meeting these conditions. Showing future appointments within next 90 days and meeting all other requirements  I discussed the assessment and treatment plan with the patient. The patient was provided an opportunity to ask questions and all were answered. The patient agreed with the plan and demonstrated an understanding of the instructions.  Patient advised to call back or seek an in-person evaluation if the symptoms or condition worsens.  Duration of encounter: .  Note by: Edward Jolly, MD Date: 06/08/2023; Time: 11:41 AM

## 2023-06-08 NOTE — Progress Notes (Signed)
Cardiology Office Note:    Date:  06/08/2023   ID:  [name], DOB 04-Feb-1966, MRN [id number]  PCP:  Marjie Skiff, NP  [facility] Cardiologist:  Lorine Bears, MD  [facility] Electrophysiologist:  None   Referring MD: Marjie Skiff, NP   Chief Complaint: 1 year follow-up  History of Present Illness:    [name] is a 57 y.o. female with a hx of ESRD on HD, COPD, prior tobacco use, DM2, HTN, migraines, hep C, HLD, PVD, anxiety, depression who presents for follow-up.   She was preivously evaluated in 2017 for atypical chest pain with echo and stress test. This showed normal LVEF. She was later referred to Dr. Kirke Corin in June 2019 with dizziness, presyncope and palpitations. Event monitor showed rare PACs, PVCs with no significant arrythmia. Follow-up echo in June 2019 showed normal LVEF with G1DD. She reestablished care in out office in April 2023 in the setting of difficult to manage HTN. She reports intermittent rest and exertional chest discomfort associated with dyspnea. She was placed on Coreg and underwent stress testing which showed no ischemia or infarct. CT attenuation imaging showed heavy 3 V coronary calcification and moderate aortic atherosclerosis. EF 45%. Follow-up echo showed LVEF 60-65%, mild LVH, G1DD, mild to mod MR, mild AI. Statin therapy was recommended, however she reported intolerance, and Zetia was started.   Last seen 06/2022 and was taking diltiazem 30mg  QID. She denies chest pain.   Today, the patient reports labile pressures. She reports when she stands up her blood pressure bottoms out. At dialysis blood pressures also bottoms out. She is taking dilt 30mg  at night. She dropped from dilt 30mg  TID to dilt 30mg  at night within the last month. She feels lightheaded and dizzy when Bps bottom out. She takes midodrine as needed for low blood pressure. Orthostatics are positive today.   Past Medical History:  Diagnosis Date   Allergy    Anemia     Anxiety    worse when not at home - bowel incontinence   Arthritis    Ataxia    Chest pain    a. 10/2016 MV: EF 57%, no ischemia/infarct; b. 04/2022 MV: EF 43%, no ischemia/infarct. Heavy 3 vessel cor Ca2+ and mod aortic atherosclerosis.   COPD (chronic obstructive pulmonary disease) (HCC)    Depression    Diabetes mellitus with complication (HCC)    Diastolic dysfunction    a. 06/2016 Echo: EF 55-60%; b. 05/2017 Echo: EF 50-55%; c. 05/2018 Echo: EF 55-60%, no rwma, GrI DD, mild AI, mild-mod MR, mod dil LA. PASP ; d. 05/2022 Echo: EF 60-65%, no rwma, mild LVH, GrI DD, nl RV fxn, mild-mod dil LA, mild-mod MR, mild AI.   ESRD on hemodialysis [facility])    MWF dialysis   Fistula    left upper arm   Hepatitis 2003   Hep C   Hiatal hernia    Hypercholesterolemia    Hypertension    Migraine    Mitral regurgitation    a. 05/2022 Echo: mild-mod MR.   Neuropathy, diabetic (HCC)    lower legs   Non-alcoholic cirrhosis (HCC)    Palpitations    a. 06/2018 Zio: RSR, 74, rare PACs and PVCs.  No significant arrhythmia.   Peripheral vascular disease (HCC)    Pneumonia 2015   Psoriasis    Tobacco dependence    Wears dentures    full upper, partial lower    Past Surgical History:  Procedure Laterality Date  A/V FISTULAGRAM Left 04/23/2020   Procedure: A/V FISTULAGRAM;  Surgeon: Renford Dills, MD;  Location: [facility] INVASIVE CV LAB;  Service: Cardiovascular;  Laterality: Left;   A/V SHUNT INTERVENTION N/A 12/16/2017   Procedure: A/V SHUNT INTERVENTION;  Surgeon: Annice Needy, MD;  Location: [facility] INVAS[facility] CV LAB;  Service: Cardiovascular;  Laterality: N/A;   AQUEOUS SHUNT Left 12/20/2019   Procedure: AHMED TUBE SHUNT WITH TUTOPLAST AND AC WASHOUT LEFT DIABETIC;  Surgeon: Lockie Mola, MD;  Location: [facility];  Service: Ophthalmology;  Laterality: Left;  Diabetic - insulin   AV FISTULA PLACEMENT Left 11/2014   CESAREAN SECTION     CHOLECYSTECTOMY     COLONOSCOPY N/A  09/22/2017   Procedure: COLONOSCOPY;  Surgeon: Toney Reil, MD;  Location: [facility];  Service: Endoscopy;  Laterality: N/A;   COLONOSCOPY WITH PROPOFOL N/A 04/16/2020   Procedure: COLONOSCOPY WITH PROPOFOL;  Surgeon: Midge Minium, MD;  Location: [facility] ENDOSCOPY;  Service: Endoscopy;  Laterality: N/A;  Priority 4   cyst removed  from left hand Left 1989   DIALYSIS/PERMA CATHETER INSERTION N/A 05/20/2017   Procedure: Dialysis/Perma Catheter Insertion and fistulagram/LUE angiogram;  Surgeon: Annice Needy, MD;  Location: [facility] INVASIVE CV LAB;  Service: Cardiovascular;  Laterality: N/A;   ESOPHAGOGASTRODUODENOSCOPY N/A 09/22/2017   Procedure: ESOPHAGOGASTRODUODENOSCOPY (EGD);  Surgeon: Toney Reil, MD;  Location: [facility] SURGERY [facility];  Service: Endoscopy;  Laterality: N/A;   ESOPHAGOGASTRODUODENOSCOPY (EGD) WITH PROPOFOL N/A 12/04/2021   Procedure: ESOPHAGOGASTRODUODENOSCOPY (EGD) WITH PROPOFOL;  Surgeon: Midge Minium, MD;  Location: [facility] ENDOSCOPY;  Service: Endoscopy;  Laterality: N/A;   EYE SURGERY Right 09/02/2022   INCISION AND DRAINAGE ABSCESS N/A 07/16/2016   Procedure: INCISION AND DRAINAGE ABSCESS;  Surgeon: Bud Face, MD;  Location: [facility] ORS;  Service: ENT;  Laterality: N/A;   PERIPHERAL VASCULAR CATHETERIZATION N/A 07/25/2015   Procedure: A/V Shuntogram/Fistulagram;  Surgeon: Annice Needy, MD;  Location: [facility] CV LAB;  Service: Cardiovascular;  Laterality: N/A;   PERIPHERAL VASCULAR CATHETERIZATION Left 07/25/2015   Procedure: A/V Shunt Intervention;  Surgeon: Annice Needy, MD;  Location: [facility] CV [facility];  Service: Cardiovascular;  Laterality: Left;   PERIPHERAL VASCULAR CATHETERIZATION Left 10/07/2015   Procedure: A/V Shuntogram/Fistulagram;  Surgeon: Annice Needy, MD;  Location: [facility];  Service: Cardiovascular;  Laterality: Left;   PERIPHERAL VASCULAR CATHETERIZATION N/A 10/07/2015   Procedure: A/V Shunt Intervention;  Surgeon:  Annice Needy, MD;  Location: [facility] CV LAB;  Service: Cardiovascular;  Laterality: N/A;   PERIPHERAL VASCULAR CATHETERIZATION  10/07/2015   Procedure: Dialysis/Perma Catheter Insertion;  Surgeon: Annice Needy, MD;  Location: [facility] CV [facility];  Service: Cardiovascular;;   PERIPHERAL VASCULAR CATHETERIZATION N/A 12/17/2015   Procedure: Dialysis/Perma Catheter Removal;  Surgeon: Renford Dills, MD;  Location: [facility];  Service: Cardiovascular;  Laterality: N/A;   PERIPHERAL VASCULAR CATHETERIZATION N/A 01/11/2017   Procedure: Visceral Angiography;  Surgeon: Annice Needy, MD;  Location: [facility] CV LAB;  Service: Cardiovascular;  Laterality: N/A;   PERIPHERAL VASCULAR CATHETERIZATION N/A 01/11/2017   Procedure: Visceral Artery Intervention;  Surgeon: Annice Needy, MD;  Location: [facility] INV[facility] CV LAB;  Service: Cardiovascular;  Laterality: N/A;   POLYPECTOMY  09/22/2017   Procedure: POLYPECTOMY;  Surgeon: Toney Reil, MD;  Location: [facility];  Service: Endoscopy;;   rt. tubal and ovary removed     STENT PLACEMENT VASCULAR ([facility] HX) Left 03/2018   Performed at [facility] using EV3 protg GPS stent graph [id number] lot [id number]   TONSILLECTOMY     UPPER EXTREMITY ANGIOGRAPHY Left 09/29/2019   Procedure: UPPER EXTREMITY ANGIOGRAPHY;  Surgeon: Renford Dills, MD;  Location: [facility] INV[facility] CV LAB;  Service: Cardiovascular;  Laterality: Left;    Current Medications: Current Meds  Medication Sig   Abatacept (ORENCIA) 125 MG/ML SOSY Inject into the skin.   BD INSULIN SYRINGE U/F 30G X 1/2" 0.5 ML MISC USE 4 TIMES DAILY WITH LANTUS & NOVOLOG   brimonidine (ALPHAGAN) 0.2 % ophthalmic solution Place 1 drop into the left eye in the morning and at bedtime.    Cholecalciferol (VITAMIN D3) 25 MCG (1000 UT) CAPS Take 2 capsules (2,000 Units total) by mouth daily.   ciclopirox (PENLAC) 8 % solution Apply topically at bedtime. Apply over  nail and surrounding skin. Apply daily over previous coat. After seven (7) days, may remove with alcohol and continue cycle.   diltiazem (CARDIZEM) 30 MG tablet Take 30 mg by mouth at bedtime.   dorzolamidel-timolol (COSOPT) 22.3-6.8 MG/ML SOLN ophthalmic solution Place 1 drop into the left eye 2 (two) times daily.    Incontinence Supply Disposable (FQ PROTECTIVE UNDERWEAR) MISC 1 CSE PU LARGE PLUS USING DAILY AS NEEDED ADD WIPES   insulin aspart (NOVOLOG) 100 UNIT/ML injection INJECT 5 TO 10 UNITS INTO THE SKIN 3 TIMES A DAY BEFORE MEALS   insulin glargine (LANTUS) 100 UNIT/ML injection INJECT 15 UNITS INTO THE SKIN DAILY.   latanoprost (XALATAN) 0.005 % ophthalmic solution Place 1 drop into the left eye daily.   leflunomide (ARAVA) 10 MG tablet Take 1 tablet by mouth daily.   levocetirizine (XYZAL) 5 MG tablet TAKE 1 TABLET BY MOUTH EVERY DAY IN THE EVENING   lidocaine (LIDODERM) 5 % Place 1 patch onto the skin daily. Remove & Discard patch within 12 hours or as directed by MD   lidocaine-prilocaine (EMLA) cream Apply 1 Application topically daily.   midodrine (PROAMATINE) 10 MG tablet TAKE 0.5-1 TABLETS (5-10 MG TOTAL) BY MOUTH DAILY AS NEEDED.   omeprazole (PRILOSEC) 40 MG capsule TAKE 1 CAPSULE (40 MG TOTAL) BY MOUTH DAILY.   [name] test strip USE TO CHECK BLOOD SUGAR 3 TIMES DAILY, FASTING IN MORNING WITH GOAL <130 AND 2 HOURS AFTER MEALS WITH GOAL <180. BRING BLOOD SUGAR LOG TO VISITS.   promethazine (PHENERGAN) 25 MG tablet Take 1 tablet (25 mg total) by mouth 2 (two) times daily as needed.   Semaglutide,0.25 or 0.5MG /DOS, 2 MG/3ML SOPN Start out injecting 0.25 MG into the skin weekly for 4 weeks and then increase to 0.5 MG injected into the skin weekly.   Vitamin D, Ergocalciferol, (DRISDOL) 1.25 MG (50000 UNIT) CAPS capsule Take 50,000 Units by mouth 3 (three) times a week.   [DISCONTINUED] diltiazem (CARDIZEM) 30 MG tablet Take 3 tablets (90 mg total) by mouth 4 (four) times  daily. (Patient taking differently: Take 90 mg by mouth 4 (four) times daily. Dose based on blood pressure results.  Currently taking 30 mg QHS)   [DISCONTINUED] oxyCODONE (OXY IR/ROXICODONE) 5 MG immediate release tablet Take 1 tablet (5 mg total) by mouth every 8 (eight) hours as needed for severe pain. Must last 30 days.     Allergies:   Cinnamon, Garlic, Onion, Tylenol [acetaminophen], Losartan potassium, Amlodipine, Ciprofloxacin, Clonidine derivatives, Ginger, Hydralazine, Lisinopril, Metoprolol, Prednisone, and Statins   Social History   Socioeconomic History   Marital status: Divorced    Spouse name: Not on file  Number of children: Not on file   Years of education: Not on file   Highest education level: Not on file  Occupational History   Occupation: disabled  Tobacco Use   Smoking status: Former    Packs/day: 0.25    Years: 20.00    Additional pack years: 0.00    Total pack years: 5.00    Types: Cigarettes    Quit date: 08/13/2021    Years since quitting: 1.8   Smokeless tobacco: Never  Vaping Use   Vaping Use: Never used  Substance and Sexual Activity   Alcohol use: No   Drug use: No   Sexual activity: Not on file  Other Topics Concern   Not on file  Social History Narrative   Patient lives at an extended stay hotel currently.  She lives with her boyfriend and son.  She does not routinely exercise.   Social Determinants of Health   Financial Resource Strain: High Risk (09/10/2022)   Overall Financial Resource Strain (CARDIA)    Difficulty of Paying Living Expenses: Hard  Food Insecurity: No Food Insecurity (09/10/2022)   Hunger Vital Sign    Worried About Running Out of Food in the Last Year: Never true    Ran Out of Food in the Last Year: Never true  Transportation Needs: No Transportation Needs (09/10/2022)   PRAPARE - Administrator, Civil Service (Medical): No    Lack of Transportation (Non-Medical): No  Physical Activity: Inactive (09/10/2022)    Exercise Vital Sign    Days of Exercise per Week: 0 days    Minutes of Exercise per Session: 0 min  Stress: Stress Concern Present (09/10/2022)   [employer] of Occupational Health - Occupational Stress Questionnaire    Feeling of Stress : Rather much  Social Connections: Moderately Isolated (09/10/2022)   Social Connection and Isolation Panel [NHANES]    Frequency of Communication with Friends and Family: More than three times a week    Frequency of Social Gatherings with Friends and Family: Never    Attends Religious Services: Never    Database administrator or Organizations: No    Attends Engineer, structural: Never    Marital Status: Living with partner     Family History: The patient's family history includes Heart disease in her mother; Hypertension in her father and mother; Skin cancer in her father.  ROS:   Please see the history of present illness.     All other systems reviewed and are negative.  EKGs/Labs/Other Studies Reviewed:    The following studies were reviewed today:  Echo 05/2022 1. Left ventricular ejection fraction, by estimation, is 60 to 65%. The  left ventricle has normal function. The left ventricle has no regional  wall motion abnormalities. There is mild left ventricular hypertrophy.  Left ventricular diastolic parameters  are consistent with Grade I diastolic dysfunction (impaired relaxation).  The average left ventricular global longitudinal strain is -17.3 %. The  global longitudinal strain is normal.   2. Right ventricular systolic function is normal. The right ventricular  size is normal.   3. Left atrial size was mild to moderately dilated.   4. The mitral valve is normal in structure. Mild to moderate mitral valve  regurgitation.   5. The aortic valve was not well visualized. Aortic valve regurgitation  is mild. Aortic valve sclerosis is present, with no evidence of aortic  valve stenosis.   6. The inferior vena cava is  normal in size  with greater than 50%  respiratory variability, suggesting right atrial pressure of 3 mmHg.   EKG:  EKG is ordered today.  The ekg ordered today demonstrates NSR, 73bpm, LAD, LVH, nonspecific T wave changes  Recent Labs: 09/10/2022: Magnesium 1.9 04/13/2023: ALT 19; BUN 55; Creatinine, Ser 6.96; Hemoglobin 11.1; Platelets 155; Potassium 5.2; Sodium 139; TSH 1.460  Recent Lipid Panel    Component Value Date/Time   CHOL 182 04/13/2023 0930   CHOL 157 10/15/2014 0402   TRIG 155 (H) 04/13/2023 0930   TRIG 83 10/15/2014 0402   HDL 40 04/13/2023 0930   HDL 49 10/15/2014 0402   VLDL 17 10/15/2014 0402   LDLCALC 114 (H) 04/13/2023 0930   LDLCALC 91 10/15/2014 0402     Physical Exam:    VS:  BP (!) 157/83 (BP Location: Left Arm, Patient Position: Sitting, Cuff Size: Large)   Pulse 73   Ht 5\' 5"  (1.651 m)   Wt 213 lb 6.4 oz (96.8 kg)   SpO2 99%   BMI 35.51 kg/m     Wt Readings from Last 3 Encounters:  06/08/23 213 lb (96.6 kg)  06/08/23 213 lb 6.4 oz (96.8 kg)  05/11/23 219 lb 6.4 oz (99.5 kg)     GEN:  Well nourished, well developed in no acute distress HEENT: Normal NECK: No JVD; No carotid bruits LYMPHATICS: No lymphadenopathy CARDIAC: RRR, no murmurs, rubs, gallops RESPIRATORY:  Clear to auscultation without rales, wheezing or rhonchi  ABDOMEN: Soft, non-tender, non-distended MUSCULOSKELETAL:  No edema; No deformity  SKIN: Warm and dry NEUROLOGIC:  Alert and oriented x 3 PSYCHIATRIC:  Normal affect   ASSESSMENT:    1. Dizziness   2. Pre-syncope   3. Orthostatic hypotension   4. Essential hypertension   5. Coronary artery calcification seen on CT scan   6. Hyperlipidemia, mixed   7. ESRD (end stage renal disease) on dialysis (HCC)   8. Mitral valve insufficiency, unspecified etiology    PLAN:    In order of problems listed above:  Dizziness/lightheadedness Pre-syncope Orthostatic hypotension H/o HTN Patient reports intermittent dizziness and  lightheadedness when she stands up. She has not fainted. She is on dialysis 3 days a week and BP drops during that time. BP is elevated at baseline, 157/83 today. She has gone from diltiazem 30mg TID to dilt 30mg  at night over the last month due to orthostastatic symptoms. She has midodrine to take for hypotension. Orthostatics are positive today. This is a difficult situation given HTN and orthostasis. We will continue diltiazem 30mg  at night. I recommended thigh high socks and abdominal binder. I will check a heart monitor and echo for completeness. She will continue midodrine as neded for hypotension.   Coronary calcifications She denies chest tightness or shortness of breath. Stress test in 04/2022 showed no ischemia, low risk scan. Continue Aspirin 81mg  daily.   HLD LDL 114, goal<70. Continue Zetia.   ESRD on HD HD on M, W, F.   Mild to mod MR On echo 05/2022. Repeat echo as above.   Disposition: Follow up in 2 month(s) with MD/APP    Signed, Charod Slawinski David Stall, PA-C  06/08/2023 12:46 PM    [facility]

## 2023-06-08 NOTE — Patient Instructions (Addendum)
Medication Instructions:  Your Physician recommend you continue on your current medication as directed.      *If you need a refill on your cardiac medications before your next appointment, please call your pharmacy*   Lab Work: None ordered today   Testing/Procedures: Your physician has requested that you have an echocardiogram. Echocardiography is a painless test that uses sound waves to create images of your heart. It provides your doctor with information about the size and shape of your heart and how well your heart's chambers and valves are working.   You may receive an ultrasound enhancing agent through an IV if needed to better visualize your heart during the echo. This procedure takes approximately one hour.  There are no restrictions for this procedure.  This will take place at [address], [place] [address]  Your physician has recommended that you wear a 14 day Zio monitor.   This monitor is a medical device that records the heart's electrical activity. Doctors most often use these monitors to diagnose arrhythmias. Arrhythmias are problems with the speed or rhythm of the heartbeat. The monitor is a small device applied to your chest. You can wear one while you do your normal daily activities. While wearing this monitor if you have any symptoms to push the button and record what you felt. Once you have worn this monitor for the period of time provider prescribed (Usually 14 days), you will return the monitor device in the postage paid box. Once it is returned they will download the data collected and provide [place] with a report which the provider will then review and we will call you with those results. Important tips:  Avoid showering during the first 24 hours of wearing the monitor. Avoid excessive sweating to help maximize wear time. Do not submerge the device, no hot tubs, and no swimming pools. Keep any lotions or oils away from the  patch. After 24 hours you may shower with the patch on. Take brief showers with your back facing the shower head.  Do not remove patch once it has been placed because that will interrupt data and decrease adhesive wear time. Push the button when you have any symptoms and write down what you were feeling. Once you have completed wearing your monitor, remove and place into box which has postage paid and place in your outgoing mailbox.  If for some reason you have misplaced your box then call our office and we can provide another box and/or mail it off for you.      Follow-Up: At [facility], you and your health needs are our priority.  As part of our continuing mission to provide you with exceptional heart care, we have created designated Provider Care Teams.  These Care Teams include your primary Cardiologist (physician) and Advanced Practice Providers (APPs -  Physician Assistants and Nurse Practitioners) who all work together to provide you with the care you need, when you need it.  We recommend signing up for the patient portal called "MyChart".  Sign up information is provided on this After Visit Summary.  MyChart is used to connect with patients for Virtual Visits (Telemedicine).  Patients are able to view lab/test results, encounter notes, upcoming appointments, etc.  Non-urgent messages can be sent to your provider as well.   To learn more about what you can do with MyChart, go to [email or web address].    Your next appointment:   4-6 month(s)  Provider:   You  may see Lorine Bears, MD or one of the following Advanced Practice Providers on your designated Care Team:   Nicolasa Ducking, NP Eula Listen, PA-C Cadence Fransico Michael, PA-C Charlsie Quest, NP

## 2023-06-08 NOTE — Telephone Encounter (Signed)
I call pt to schedule an appointment and left a message to call for an echo and 4 to 6 month follow up

## 2023-06-08 NOTE — Telephone Encounter (Signed)
-----   Message from Parke Poisson, RN sent at 06/08/2023 11:14 AM EDT ----- Please schedule pt for and ECHO and 4-6 week follow up with Arida or Cadence.  Thanks, Elease Hashimoto, RN

## 2023-06-08 NOTE — Progress Notes (Signed)
Nursing Pain Medication Assessment:  Safety precautions to be maintained throughout the outpatient stay will include: orient to surroundings, keep bed in low position, maintain call bell within reach at all times, provide assistance with transfer out of bed and ambulation.  Medication Inspection Compliance: Pill count conducted under aseptic conditions, in front of the patient. Neither the pills nor the bottle was removed from the patient's sight at any time. Once count was completed pills were immediately returned to the patient in their original bottle.  Medication: Oxycodone IR Pill/Patch Count:  40 of 75 pills remain Pill/Patch Appearance: Markings consistent with prescribed medication Bottle Appearance: Standard pharmacy container. Clearly labeled. Filled Date: 06 / 04 / 2024 Last Medication intake:  Yesterday

## 2023-06-09 NOTE — Telephone Encounter (Signed)
Spoke to [place] and she said she will call later for appointment, echo and 4 to 6 wk follow up

## 2023-06-12 DIAGNOSIS — R55 Syncope and collapse: Secondary | ICD-10-CM | POA: Diagnosis not present

## 2023-06-12 DIAGNOSIS — R42 Dizziness and giddiness: Secondary | ICD-10-CM | POA: Diagnosis not present

## 2023-06-15 ENCOUNTER — Other Ambulatory Visit: Payer: Self-pay | Admitting: Nurse Practitioner

## 2023-06-15 DIAGNOSIS — E1122 Type 2 diabetes mellitus with diabetic chronic kidney disease: Secondary | ICD-10-CM

## 2023-06-16 NOTE — Telephone Encounter (Signed)
Requested Prescriptions  Pending Prescriptions Disp Refills   insulin glargine (LANTUS) 100 UNIT/ML injection [Pharmacy Med Name: LANTUS 100 UNIT/ML VIAL] 10 mL 0    Sig: INJECT 15 UNITS INTO THE SKIN DAILY.     Endocrinology:  Diabetes - Insulins Passed - 06/15/2023 12:32 PM      Passed - HBA1C is between 0 and 7.9 and within 180 days    Hemoglobin A1C  Date Value Ref Range Status  07/20/2021 8.2  Final    Comment:    [facility]   HB A1C ([facility] - WAIVED)  Date Value Ref Range Status  04/13/2023 7.4 (H) 4.8 - 5.6 % Final    Comment:             Prediabetes: 5.7 - 6.4          Diabetes: >6.4          Glycemic control for adults with diabetes: <7.0          Passed - Valid encounter within last 6 months    Recent Outpatient Visits           1 month ago Type 2 diabetes mellitus with ESRD (end-stage renal disease) (HCC)   [place] [facility] [place], Jolene T, NP   2 months ago Type 2 diabetes mellitus with ESRD (end-stage renal disease) (HCC)   [place] [facility] [place], Jolene T, NP   9 months ago Diabetic polyneuropathy associated with type 2 diabetes mellitus (HCC)   [place] [facility] [place], Corrie Dandy T, NP   1 year ago Hypertension associated with diabetes (HCC)   [place] [facility] [place], Bracey T, NP   1 year ago Type 2 diabetes mellitus with ESRD (end-stage renal disease) (HCC)   [place] [facility] [place], Dorie Rank, NP       Future Appointments             In 3 weeks [place], Dorie Rank, NP [facility]

## 2023-06-22 ENCOUNTER — Ambulatory Visit: Payer: 59

## 2023-06-23 ENCOUNTER — Other Ambulatory Visit: Payer: Self-pay | Admitting: Nurse Practitioner

## 2023-06-23 NOTE — Telephone Encounter (Signed)
Unable to refill per protocol, Rx request is too soon. Last refill 05/25/23 for 90 days.  Requested Prescriptions  Pending Prescriptions Disp Refills   omeprazole (PRILOSEC) 40 MG capsule [Pharmacy Med Name: OMEPRAZOLE DR 40 MG CAPSULE] 90 capsule 0    Sig: TAKE 1 CAPSULE (40 MG TOTAL) BY MOUTH DAILY.     Gastroenterology: Proton Pump Inhibitors Passed - 06/23/2023 10:08 AM      Passed - Valid encounter within last 12 months    Recent Outpatient Visits           1 month ago Type 2 diabetes mellitus with ESRD (end-stage renal disease) (HCC)   [place] [facility] [place], Jolene T, NP   2 months ago Type 2 diabetes mellitus with ESRD (end-stage renal disease) (HCC)   [place] [facility] [place], Jolene T, NP   9 months ago Diabetic polyneuropathy associated with type 2 diabetes mellitus (HCC)   [place] [facility] [place], Corrie Dandy T, NP   1 year ago Hypertension associated with diabetes (HCC)   [place] [facility] [place], [place] T, NP   1 year ago Type 2 diabetes mellitus with ESRD (end-stage renal disease) (HCC)   [place] [facility] [place], Dorie Rank, NP       Future Appointments             In 2 weeks [place], Dorie Rank, NP  [facility]

## 2023-07-12 ENCOUNTER — Ambulatory Visit: Payer: 59 | Admitting: Nurse Practitioner

## 2023-07-15 ENCOUNTER — Ambulatory Visit: Payer: 59 | Admitting: Nurse Practitioner

## 2023-07-15 DIAGNOSIS — E1142 Type 2 diabetes mellitus with diabetic polyneuropathy: Secondary | ICD-10-CM

## 2023-07-15 DIAGNOSIS — E1159 Type 2 diabetes mellitus with other circulatory complications: Secondary | ICD-10-CM

## 2023-07-15 DIAGNOSIS — B182 Chronic viral hepatitis C: Secondary | ICD-10-CM

## 2023-07-15 DIAGNOSIS — E1122 Type 2 diabetes mellitus with diabetic chronic kidney disease: Secondary | ICD-10-CM

## 2023-07-15 DIAGNOSIS — M0579 Rheumatoid arthritis with rheumatoid factor of multiple sites without organ or systems involvement: Secondary | ICD-10-CM

## 2023-07-15 DIAGNOSIS — E1169 Type 2 diabetes mellitus with other specified complication: Secondary | ICD-10-CM

## 2023-07-15 DIAGNOSIS — Z1231 Encounter for screening mammogram for malignant neoplasm of breast: Secondary | ICD-10-CM

## 2023-07-15 DIAGNOSIS — I739 Peripheral vascular disease, unspecified: Secondary | ICD-10-CM

## 2023-07-15 DIAGNOSIS — M791 Myalgia, unspecified site: Secondary | ICD-10-CM

## 2023-07-15 DIAGNOSIS — F3341 Major depressive disorder, recurrent, in partial remission: Secondary | ICD-10-CM

## 2023-07-15 DIAGNOSIS — N186 End stage renal disease: Secondary | ICD-10-CM

## 2023-07-15 DIAGNOSIS — F411 Generalized anxiety disorder: Secondary | ICD-10-CM

## 2023-07-15 DIAGNOSIS — G894 Chronic pain syndrome: Secondary | ICD-10-CM

## 2023-07-19 ENCOUNTER — Other Ambulatory Visit: Payer: Self-pay | Admitting: Nurse Practitioner

## 2023-07-19 DIAGNOSIS — E1122 Type 2 diabetes mellitus with diabetic chronic kidney disease: Secondary | ICD-10-CM

## 2023-07-20 NOTE — Telephone Encounter (Signed)
Requested Prescriptions  Pending Prescriptions Disp Refills   insulin glargine (LANTUS) 100 UNIT/ML injection [Pharmacy Med Name: LANTUS 100 UNIT/ML VIAL] 30 mL 0    Sig: INJECT 15 UNITS INTO THE SKIN DAILY.     Endocrinology:  Diabetes - Insulins Passed - 07/19/2023 11:30 AM      Passed - HBA1C is between 0 and 7.9 and within 180 days    Hemoglobin A1C  Date Value Ref Range Status  07/20/2021 8.2  Final    Comment:    [facility]   HB A1C (BAYER DCA - WAIVED)  Date Value Ref Range Status  04/13/2023 7.4 (H) 4.8 - 5.6 % Final    Comment:             Prediabetes: 5.7 - 6.4          Diabetes: >6.4          Glycemic control for adults with diabetes: <7.0          Passed - Valid encounter within last 6 months    Recent Outpatient Visits           2 months ago Type 2 diabetes mellitus with ESRD (end-stage renal disease) (HCC)   [place] [facility] [place], Jolene T, NP   3 months ago Type 2 diabetes mellitus with ESRD (end-stage renal disease) (HCC)   [place] [facility] [place], Jolene T, NP   10 months ago Diabetic polyneuropathy associated with type 2 diabetes mellitus (HCC)   [place] [facility] [place], Corrie Dandy T, NP   1 year ago Hypertension associated with diabetes (HCC)   [place] [facility] [place], [place] T, NP   1 year ago Type 2 diabetes mellitus with ESRD (end-stage renal disease) (HCC)   [place] [facility] [place], Dorie Rank, NP

## 2023-08-12 ENCOUNTER — Other Ambulatory Visit: Payer: Self-pay | Admitting: Nurse Practitioner

## 2023-08-12 NOTE — Telephone Encounter (Signed)
Requested medication (s) are due for refill today: routing for review  Requested medication (s) are on the active medication list: yes  Last refill:  04/27/23  Future visit scheduled: no  Notes to clinic:  Unable to refill per protocol, Rx not attached to protocol. Routing for approval.      Requested Prescriptions  Pending Prescriptions Disp Refills   BD INSULIN SYRINGE U/F 30G X 1/2" 0.5 ML MISC [Pharmacy Med Name: BD INS SYR UF 0.5ML 12.7MMX30G] 100 each 3    Sig: USE 4 TIMES DAILY WITH LANTUS & NOVOLOG     There is no refill protocol information for this order

## 2023-08-16 ENCOUNTER — Telehealth: Payer: Self-pay | Admitting: Cardiovascular Disease

## 2023-08-16 NOTE — Telephone Encounter (Signed)
Left voice mail to schedule appt

## 2023-08-16 NOTE — Telephone Encounter (Signed)
Left voicemail, pt needs echo scheduled

## 2023-08-19 ENCOUNTER — Telehealth: Payer: Self-pay | Admitting: Nurse Practitioner

## 2023-08-19 NOTE — Telephone Encounter (Signed)
Copied from CRM [id number]. Topic: General - Inquiry >> Aug 19, 2023 10:43 AM Lennox Pippins wrote: [facility] calling from [facility] has called in regards to patient's insurance and stated that provider status is terimated with Aura D[employer] with [facility] and [facility] said [employer]ura [employer] needs to contact provider services line to update credentials @ # [phone] this status with [facility]

## 2023-08-20 ENCOUNTER — Other Ambulatory Visit: Payer: Self-pay | Admitting: Nurse Practitioner

## 2023-08-20 NOTE — Telephone Encounter (Signed)
Requested Prescriptions  Refused Prescriptions Disp Refills   busPIRone (BUSPAR) 5 MG tablet [Pharmacy Med Name: BUSPIRONE HCL 5 MG TABLET] 180 tablet 4    Sig: TAKE 2 TABLETS (10 MG TOTAL) BY MOUTH 2 (TWO) TIMES DAILY AS NEEDED.     Psychiatry: Anxiolytics/Hypnotics - Non-controlled Passed - 08/20/2023  1:32 AM      Passed - Valid encounter within last 12 months    Recent Outpatient Visits           3 months ago Type 2 diabetes mellitus with ESRD (end-stage renal disease) (HCC)   [place] [facility] [place], Jolene T, NP   4 months ago Type 2 diabetes mellitus with ESRD (end-stage renal disease) (HCC)   [place] [facility] [place], Jolene T, NP   11 months ago Diabetic polyneuropathy associated with type 2 diabetes mellitus (HCC)   [place] [facility] [place], Corrie Dandy T, NP   1 year ago Hypertension associated with diabetes (HCC)   [place] [facility] [place], [place] T, NP   1 year ago Type 2 diabetes mellitus with ESRD (end-stage renal disease) (HCC)   [place] [facility] [place], Dorie Rank, NP

## 2023-08-23 ENCOUNTER — Other Ambulatory Visit: Payer: Self-pay | Admitting: Nurse Practitioner

## 2023-08-25 NOTE — Telephone Encounter (Signed)
Requested Prescriptions  Pending Prescriptions Disp Refills   omeprazole (PRILOSEC) 40 MG capsule [Pharmacy Med Name: OMEPRAZOLE DR 40 MG CAPSULE] 90 capsule 0    Sig: TAKE 1 CAPSULE (40 MG TOTAL) BY MOUTH DAILY.     Gastroenterology: Proton Pump Inhibitors Passed - 08/23/2023  3:48 PM      Passed - Valid encounter within last 12 months    Recent Outpatient Visits           3 months ago Type 2 diabetes mellitus with ESRD (end-stage renal disease) (HCC)   [place] [facility] [place], Jolene T, NP   4 months ago Type 2 diabetes mellitus with ESRD (end-stage renal disease) (HCC)   [place] [facility] [place], Jolene T, NP   11 months ago Diabetic polyneuropathy associated with type 2 diabetes mellitus (HCC)   [place] [facility] [place], Corrie Dandy T, NP   1 year ago Hypertension associated with diabetes (HCC)   [place] [facility] [place], [place] T, NP   1 year ago Type 2 diabetes mellitus with ESRD (end-stage renal disease) (HCC)   [place] [facility] [place], Dorie Rank, NP

## 2023-08-27 NOTE — Telephone Encounter (Signed)
[facility] thanking patient for making [place] aware of this issue. Also advised patient that the issue is being investigated and that she can still continue to see Jolene as this issue is believed to be an error.

## 2023-09-07 ENCOUNTER — Telehealth: Payer: Self-pay | Admitting: Nurse Practitioner

## 2023-09-07 NOTE — Telephone Encounter (Signed)
Copied from CRM [id number]. Topic: General - Inquiry >> Sep 07, 2023  9:35 AM Haroldine Laws wrote: Reason for CRM: pt called asking to speak to the office manager about her insurance.  She said her ins is telling her Corrie Dandy is not in network but Jolene says she is.  She needs an appt but is afraid to make one.  CB#  [phone]

## 2023-09-08 ENCOUNTER — Encounter: Payer: Self-pay | Admitting: Nurse Practitioner

## 2023-09-16 ENCOUNTER — Encounter: Payer: Self-pay | Admitting: Student in an Organized Health Care Education/Training Program

## 2023-09-16 ENCOUNTER — Ambulatory Visit
Payer: 59 | Attending: Student in an Organized Health Care Education/Training Program | Admitting: Student in an Organized Health Care Education/Training Program

## 2023-09-16 VITALS — BP 184/76 | HR 71 | Temp 98.4°F | Resp 16 | Ht 65.0 in | Wt 207.2 lb

## 2023-09-16 DIAGNOSIS — M5416 Radiculopathy, lumbar region: Secondary | ICD-10-CM | POA: Insufficient documentation

## 2023-09-16 DIAGNOSIS — G894 Chronic pain syndrome: Secondary | ICD-10-CM | POA: Insufficient documentation

## 2023-09-16 DIAGNOSIS — E1142 Type 2 diabetes mellitus with diabetic polyneuropathy: Secondary | ICD-10-CM | POA: Diagnosis present

## 2023-09-16 DIAGNOSIS — M4726 Other spondylosis with radiculopathy, lumbar region: Secondary | ICD-10-CM

## 2023-09-16 DIAGNOSIS — M5412 Radiculopathy, cervical region: Secondary | ICD-10-CM | POA: Insufficient documentation

## 2023-09-16 DIAGNOSIS — Z794 Long term (current) use of insulin: Secondary | ICD-10-CM

## 2023-09-16 DIAGNOSIS — M47816 Spondylosis without myelopathy or radiculopathy, lumbar region: Secondary | ICD-10-CM | POA: Diagnosis present

## 2023-09-16 MED ORDER — OXYCODONE HCL 5 MG PO TABS
5.0000 mg | ORAL_TABLET | Freq: Three times a day (TID) | ORAL | 0 refills | Status: DC | PRN
Start: 2023-10-28 — End: 2023-12-08

## 2023-09-16 MED ORDER — OXYCODONE HCL 5 MG PO TABS
5.0000 mg | ORAL_TABLET | Freq: Three times a day (TID) | ORAL | 0 refills | Status: DC | PRN
Start: 2023-11-27 — End: 2024-02-01

## 2023-09-16 MED ORDER — OXYCODONE HCL 5 MG PO TABS
5.0000 mg | ORAL_TABLET | Freq: Three times a day (TID) | ORAL | 0 refills | Status: DC | PRN
Start: 2023-09-28 — End: 2023-12-08

## 2023-09-16 NOTE — Progress Notes (Signed)
PROVIDER NOTE: Information contained herein reflects review and annotations entered in association with encounter. Interpretation of such information and data should be left to medically-trained personnel. Information provided to patient can be located elsewhere in the medical record under "Patient Instructions". Document created using STT-dictation technology, any transcriptional errors that may result from process are unintentional.    Patient: [name]  Service Category: E/M  Provider: Edward Jolly, MD  DOB: 11-05-1966  DOS: 09/16/2023  Specialty: Interventional Pain Management  MRN: [id number]  Setting: Ambulatory outpatient  PCP: Marjie Skiff, NP  Type: Established Patient    Referring Provider: Marjie Skiff, NP  Location: Office  Delivery: Face-to-face     HPI  [name], a 57 y.o. year old female, is here today because of her Diabetic polyneuropathy associated with type 2 diabetes mellitus (HCC) [E11.42]. Ms. Tackitt primary complain today is Neck Pain and Back Pain Last encounter: My last encounter with her was on 06/08/23 Pertinent problems: Ms. Mees has ESRD (end stage renal disease) on dialysis [facility]); Type 2 diabetes mellitus with ESRD (end-stage renal disease) (HCC); Chronic bilateral low back pain; DDD (degenerative disc disease), cervical; Spondylosis of cervical region without myelopathy or radiculopathy; PAD (peripheral artery disease) (HCC); Chronic pain syndrome; Diabetic polyneuropathy associated with type 2 diabetes mellitus (HCC); Cervical radiculopathy; Lumbar radiculopathy; GAD (generalized anxiety disorder); and Recurrent major depression in partial remission (HCC) on their pertinent problem list. Pain Assessment: Severity of Chronic pain is reported as a 8 /10. Location: Neck Posterior/Radaites from back of neck to elbow down left arm and down to the fingers of the right arm. Onset: More than a month ago. Quality: Aching, Constant, Shooting, Sore, Heaviness,  Sharp. Timing: Constant. Modifying factor(s): Heating pad and pain medication. Vitals:  height is 5\' 5"  (1.651 m) and weight is 207 lb 3.7 oz (94 kg). Her temporal temperature is 98.4 F (36.9 C). Her blood pressure is 184/76 (abnormal) and her pulse is 71. Her respiration is 16 and oxygen saturation is 98%.   Reason for encounter: medication management.   -Stopped Ozemepic bc of GI side effects (n/v). -Continues with dialysis -Patient continues multimodal pain regimen as prescribed.  States that it provides pain relief and improvement in functional status.     Pharmacotherapy Assessment  Analgesic: Oxycodone 5 mg q8 hrs prn quantity 75/month; MME equals 22.5    Monitoring: [place] [facility] [place] PMP: PDMP reviewed during this encounter.       Pharmacotherapy: No side-effects or adverse reactions reported. Compliance: No problems identified. Effectiveness: Clinically acceptable.  Earlyne Iba, RN  09/16/2023  8:09 AM  Sign when Signing Visit Nursing Pain Medication Assessment:  Safety precautions to be maintained throughout the outpatient stay will include: orient to surroundings, keep bed in low position, maintain call bell within reach at all times, provide assistance with transfer out of bed and ambulation.  Medication Inspection Compliance: Pill count conducted under aseptic conditions, in front of the patient. Neither the pills nor the bottle was removed from the patient's sight at any time. Once count was completed pills were immediately returned to the patient in their original bottle.  Medication: Oxycodone IR Pill/Patch Count:  39 of 75 pills remain Pill/Patch Appearance: Markings consistent with prescribed medication Bottle Appearance: Standard pharmacy container. Clearly labeled. Filled Date: 09 / 08 / 2024 Last Medication intake:  Day before yesterday     UDS:  No results found for: "SUMMARY"   ROS  Constitutional: Denies any fever or chills Gastrointestinal: No  reported hemesis,  hematochezia, vomiting, or acute GI distress Musculoskeletal:  low back pain, bilateral hand pain, parasthesias, and swelling Neurological: No reported episodes of acute onset apraxia, aphasia, dysarthria, agnosia, amnesia, paralysis, loss of coordination, or loss of consciousness  Medication Review  Abatacept, Banana Flakes, FQ Protective Underwear, Insulin Syringe-Needle U-100, Vitamin D (Ergocalciferol), Vitamin D3, brimonidine, ciclopirox, diltiazem, dorzolamidel-timolol, glucose blood, insulin aspart, insulin glargine, latanoprost, leflunomide, levocetirizine, lidocaine, lidocaine-prilocaine, midodrine, omeprazole, oxyCODONE, and promethazine  History Review  Allergy: Ms. Gehring is allergic to cinnamon, garlic, onion, tylenol [acetaminophen], losartan potassium, amlodipine, ciprofloxacin, clonidine derivatives, ginger, hydralazine, lisinopril, metoprolol, prednisone, and statins. Drug: Ms. Belinski  reports no history of drug use. Alcohol:  reports no history of alcohol use. Tobacco:  reports that she quit smoking about 2 years ago. Her smoking use included cigarettes. She started smoking about 22 years ago. She has a 5 pack-year smoking history. She has never used smokeless tobacco. Social: Ms. Goff  reports that she quit smoking about 2 years ago. Her smoking use included cigarettes. She started smoking about 22 years ago. She has a 5 pack-year smoking history. She has never used smokeless tobacco. She reports that she does not drink alcohol and does not use drugs. Medical:  has a past medical history of Allergy, Anemia, Anxiety, Arthritis, Ataxia, Chest pain, COPD (chronic obstructive pulmonary disease) (HCC), Depression, Diabetes mellitus with complication (HCC), Diastolic dysfunction, ESRD on hemodialysis (HCC), Fistula, Hepatitis (2003), Hiatal hernia, Hypercholesterolemia, Hypertension, Migraine, Mitral regurgitation, Neuropathy, diabetic (HCC), Non-alcoholic cirrhosis (HCC), Palpitations,  Peripheral vascular disease (HCC), Pneumonia (2015), Psoriasis, Tobacco dependence, and Wears dentures. Surgical: Ms. Goth  has a past surgical history that includes Tonsillectomy; rt. tubal and ovary removed; Cholecystectomy; Cardiac catheterization (N/A, 07/25/2015); Cardiac catheterization (Left, 07/25/2015); Cardiac catheterization (Left, 10/07/2015); Cardiac catheterization (N/A, 10/07/2015); Cardiac catheterization (10/07/2015); Cardiac catheterization (N/A, 12/17/2015); Incision and drainage abscess (N/A, 07/16/2016); cyst removed  from left hand (Left, 1989); AV fistula placement (Left, 11/2014); Cardiac catheterization (N/A, 01/11/2017); Cardiac catheterization (N/A, 01/11/2017); DIALYSIS/PERMA CATHETER INSERTION (N/A, 05/20/2017); Cesarean section; Colonoscopy (N/A, 09/22/2017); Esophagogastroduodenoscopy (N/A, 09/22/2017); polypectomy (09/22/2017); A/V SHUNT INTERVENTION (N/A, 12/16/2017); STENT PLACEMENT VASCULAR ([facility] HX) (Left, 03/2018); Upper Extremity Angiography (Left, 09/29/2019); Aqueous shunt (Left, 12/20/2019); Colonoscopy with propofol (N/A, 04/16/2020); A/V Fistulagram (Left, 04/23/2020); Esophagogastroduodenoscopy (egd) with propofol (N/A, 12/04/2021); and Eye surgery (Right, 09/02/2022). Family: family history includes Heart disease in her mother; Hypertension in her father and mother; Skin cancer in her father.  Laboratory Chemistry Profile   Renal Lab Results  Component Value Date   BUN 55 (H) 04/13/2023   CREATININE 6.96 (H) 04/13/2023   BCR 8 (L) 04/13/2023   GFRAA 6 (L) 05/01/2019   GFRNONAA 6 (L) 05/01/2019    Hepatic Lab Results  Component Value Date   AST 18 04/13/2023   ALT 19 04/13/2023   ALBUMIN 4.3 04/13/2023   ALKPHOS 104 04/13/2023   HCVAB >11.0 (H) 12/16/2016   LIPASE 21 04/30/2019    Electrolytes Lab Results  Component Value Date   NA 139 04/13/2023   K 5.2 04/13/2023   CL 99 04/13/2023   CALCIUM 8.7 04/13/2023   MG 1.9 09/10/2022   PHOS 4.2  05/01/2019    Bone Lab Results  Component Value Date   VD25OH 11.2 (L) 04/13/2023    Inflammation (CRP: Acute Phase) (ESR: Chronic Phase) Lab Results  Component Value Date   CRP <0.8 12/16/2016   ESRSEDRATE 117 (H) 10/12/2017   LATICACIDVEN 1.6 04/30/2019         Note:  Above Lab results reviewed.  Recent Imaging Review  LONG TERM MONITOR (3-14 DAYS) Patch Wear Time:  6 days and 11 hours (2024-06-22T09:07:05-399 to  2024-06-28T20:39:58-0400) Patient had a min HR of 67 bpm, max HR of 154 bpm, and avg HR of 81 bpm.  Predominant underlying rhythm was Sinus Rhythm. Intermittent Bundle Branch  Block was present. 1 run of Supraventricular Tachycardia occurred lasting  15 beats with a max rate of 154 bpm (avg 124 bpm).  Rare PACs and rare PVCs. Most triggered events did not correlate with arrhythmia. Note: Reviewed        Physical Exam  General appearance: Well nourished, well developed, and well hydrated. In no apparent acute distress Mental status: Alert, oriented x 3 (person, place, & time)       Respiratory: No evidence of acute respiratory distress Eyes: PERLA Vitals: BP (!) 184/76   Pulse 71   Temp 98.4 F (36.9 C) (Temporal)   Resp 16   Ht 5\' 5"  (1.651 m)   Wt 207 lb 3.7 oz (94 kg)   SpO2 98%   BMI 34.49 kg/m  BMI: Estimated body mass index is 34.49 kg/m as calculated from the following:   Height as of this encounter: 5\' 5"  (1.651 m).   Weight as of this encounter: 207 lb 3.7 oz (94 kg). Ideal: Ideal body weight: 57 kg (125 lb 10.6 oz) Adjusted ideal body weight: 71.8 kg (158 lb 4.6 oz)  Cervical Spine Area Exam  Skin & Axial Inspection: No masses, redness, edema, swelling, or associated skin lesions Alignment: Symmetrical Functional ROM: Decreased ROM, to the right Stability: No instability detected Muscle Tone/Strength: Functionally intact. No obvious neuro-muscular anomalies detected. Sensory (Neurological): Dermatomal pain pattern RIGHT C 5,6 Palpation:  No palpable anomalies               Thoracic Spine Area Exam  Skin & Axial Inspection: No masses, redness, or swelling Alignment: Symmetrical Functional ROM: Unrestricted ROM Stability: No instability detected Muscle Tone/Strength: Functionally intact. No obvious neuro-muscular anomalies detected. Sensory (Neurological): Unimpaired Muscle strength & Tone: No palpable anomalies  Lumbar Spine Area Exam  Skin & Axial Inspection: No masses, redness, or swelling Alignment: Symmetrical Functional ROM: Pain restricted ROM       Stability: No instability detected Muscle Tone/Strength: Functionally intact. No obvious neuro-muscular anomalies detected. Sensory (Neurological): Musculoskeletal pain pattern  Gait & Posture Assessment  Ambulation: Patient ambulates using a cane Gait: Relatively normal for age and body habitus Posture: WNL  Lower Extremity Exam    Side: Right lower extremity  Side: Left lower extremity  Stability: No instability observed          Stability: No instability observed          Skin & Extremity Inspection: Skin color, temperature, and hair growth are WNL. No peripheral edema or cyanosis. No masses, redness, swelling, asymmetry, or associated skin lesions. No contractures.  Skin & Extremity Inspection: Skin color, temperature, and hair growth are WNL. No peripheral edema or cyanosis. No masses, redness, swelling, asymmetry, or associated skin lesions. No contractures.  Functional ROM: Unrestricted ROM                  Functional ROM: Unrestricted ROM                  Muscle Tone/Strength: Functionally intact. No obvious neuro-muscular anomalies detected.  Muscle Tone/Strength: Functionally intact. No obvious neuro-muscular anomalies detected.  Sensory (Neurological): Neuropathic pain pattern  Sensory (Neurological): Neuropathic pain pattern        DTR: Patellar: deferred today Achilles: deferred today Plantar: deferred today  DTR: Patellar: deferred  today Achilles: deferred today Plantar: deferred today  Palpation: No palpable anomalies  Palpation: No palpable anomalies    Assessment   Diagnosis   1. Diabetic polyneuropathy associated with type 2 diabetes mellitus (HCC)   2. Cervical radiculopathy   3. Lumbar spondylosis   4. Lumbar radiculopathy   5. Chronic pain syndrome           Plan of Care    [name] has a current medication list which includes the following long-term medication(s): orencia, novolog, insulin glargine, leflunomide, levocetirizine, omeprazole, promethazine, [START ON 09/28/2023] oxycodone, [START ON 10/28/2023] oxycodone, and [START ON 11/27/2023] oxycodone.   1. Diabetic polyneuropathy associated with type 2 diabetes mellitus (HCC) - oxyCODONE (OXY IR/ROXICODONE) 5 MG immediate release tablet; Take 1 tablet (5 mg total) by mouth every 8 (eight) hours as needed for severe pain. Must last 30 days.  Dispense: 75 tablet; Refill: 0 - oxyCODONE (OXY IR/ROXICODONE) 5 MG immediate release tablet; Take 1 tablet (5 mg total) by mouth every 8 (eight) hours as needed for severe pain. Must last 30 days.  Dispense: 75 tablet; Refill: 0 - oxyCODONE (OXY IR/ROXICODONE) 5 MG immediate release tablet; Take 1 tablet (5 mg total) by mouth every 8 (eight) hours as needed for severe pain. Must last 30 days.  Dispense: 75 tablet; Refill: 0  2. Cervical radiculopathy - oxyCODONE (OXY IR/ROXICODONE) 5 MG immediate release tablet; Take 1 tablet (5 mg total) by mouth every 8 (eight) hours as needed for severe pain. Must last 30 days.  Dispense: 75 tablet; Refill: 0 - oxyCODONE (OXY IR/ROXICODONE) 5 MG immediate release tablet; Take 1 tablet (5 mg total) by mouth every 8 (eight) hours as needed for severe pain. Must last 30 days.  Dispense: 75 tablet; Refill: 0 - oxyCODONE (OXY IR/ROXICODONE) 5 MG immediate release tablet; Take 1 tablet (5 mg total) by mouth every 8 (eight) hours as needed for severe pain. Must last 30 days.   Dispense: 75 tablet; Refill: 0  3. Lumbar spondylosis  4. Lumbar radiculopathy  5. Chronic pain syndrome - oxyCODONE (OXY IR/ROXICODONE) 5 MG immediate release tablet; Take 1 tablet (5 mg total) by mouth every 8 (eight) hours as needed for severe pain. Must last 30 days.  Dispense: 75 tablet; Refill: 0 - oxyCODONE (OXY IR/ROXICODONE) 5 MG immediate release tablet; Take 1 tablet (5 mg total) by mouth every 8 (eight) hours as needed for severe pain. Must last 30 days.  Dispense: 75 tablet; Refill: 0 - oxyCODONE (OXY IR/ROXICODONE) 5 MG immediate release tablet; Take 1 tablet (5 mg total) by mouth every 8 (eight) hours as needed for severe pain. Must last 30 days.  Dispense: 75 tablet; Refill: 0     -Continue with TENs unit -Discussed exercise and encouraged her to walk more. -Repeat cervical ESI as needed cervical radicular pain flare.    Pharmacotherapy (Medications Ordered): Meds ordered this encounter  Medications   oxyCODONE (OXY IR/ROXICODONE) 5 MG immediate release tablet    Sig: Take 1 tablet (5 mg total) by mouth every 8 (eight) hours as needed for severe pain. Must last 30 days.    Dispense:  75 tablet    Refill:  0    Chronic Pain. (STOP Act - Not applicable). Fill one day early if closed on scheduled refill date.   oxyCODONE (OXY IR/ROXICODONE)  5 MG immediate release tablet    Sig: Take 1 tablet (5 mg total) by mouth every 8 (eight) hours as needed for severe pain. Must last 30 days.    Dispense:  75 tablet    Refill:  0    Chronic Pain. (STOP Act - Not applicable). Fill one day early if closed on scheduled refill date.   oxyCODONE (OXY IR/ROXICODONE) 5 MG immediate release tablet    Sig: Take 1 tablet (5 mg total) by mouth every 8 (eight) hours as needed for severe pain. Must last 30 days.    Dispense:  75 tablet    Refill:  0    Chronic Pain. (STOP Act - Not applicable). Fill one day early if closed on scheduled refill date.   Orders:  No orders of the defined types  were placed in this encounter.  Follow-up plan:   Return in about 3 months (around 12/16/2023) for MM, F2F.     Recent Visits No visits were found meeting these conditions. Showing recent visits within past 90 days and meeting all other requirements Today's Visits Date Type Provider Dept  09/16/23 Office Visit Edward Jolly, MD [facility]  Showing today's visits and meeting all other requirements Future Appointments No visits were found meeting these conditions. Showing future appointments within next 90 days and meeting all other requirements  I discussed the assessment and treatment plan with the patient. The patient was provided an opportunity to ask questions and all were answered. The patient agreed with the plan and demonstrated an understanding of the instructions.  Patient advised to call back or seek an in-person evaluation if the symptoms or condition worsens.  Duration of encounter: .  Note by: Edward Jolly, MD Date: 09/16/2023; Time: 8:18 AM

## 2023-09-16 NOTE — Progress Notes (Signed)
Nursing Pain Medication Assessment:  Safety precautions to be maintained throughout the outpatient stay will include: orient to surroundings, keep bed in low position, maintain call bell within reach at all times, provide assistance with transfer out of bed and ambulation.  Medication Inspection Compliance: Pill count conducted under aseptic conditions, in front of the patient. Neither the pills nor the bottle was removed from the patient's sight at any time. Once count was completed pills were immediately returned to the patient in their original bottle.  Medication: Oxycodone IR Pill/Patch Count:  39 of 75 pills remain Pill/Patch Appearance: Markings consistent with prescribed medication Bottle Appearance: Standard pharmacy container. Clearly labeled. Filled Date: 09 / 08 / 2024 Last Medication intake:  Day before yesterday

## 2023-09-21 ENCOUNTER — Ambulatory Visit: Payer: 59 | Attending: Medical

## 2023-10-03 ENCOUNTER — Encounter: Payer: Self-pay | Admitting: Nurse Practitioner

## 2023-10-04 NOTE — Telephone Encounter (Signed)
Attempted to reach patient, LVM to call office back to get schedule with provider or another provider in office.  If hers is full.  Put in CRM.

## 2023-10-07 ENCOUNTER — Ambulatory Visit: Payer: 59 | Admitting: Pediatrics

## 2023-10-07 VITALS — BP 162/70 | HR 72 | Temp 98.0°F | Wt 207.4 lb

## 2023-10-07 DIAGNOSIS — E11621 Type 2 diabetes mellitus with foot ulcer: Secondary | ICD-10-CM | POA: Diagnosis not present

## 2023-10-07 DIAGNOSIS — L97429 Non-pressure chronic ulcer of left heel and midfoot with unspecified severity: Secondary | ICD-10-CM | POA: Diagnosis not present

## 2023-10-07 DIAGNOSIS — E1159 Type 2 diabetes mellitus with other circulatory complications: Secondary | ICD-10-CM | POA: Diagnosis not present

## 2023-10-07 DIAGNOSIS — I152 Hypertension secondary to endocrine disorders: Secondary | ICD-10-CM | POA: Diagnosis not present

## 2023-10-07 DIAGNOSIS — Z7985 Long-term (current) use of injectable non-insulin antidiabetic drugs: Secondary | ICD-10-CM

## 2023-10-07 NOTE — Assessment & Plan Note (Addendum)
Per patient, goal BP is 150/90. She has upcoming cardiology and nephrology follow up. Declines further treatment discussion at this time.

## 2023-10-07 NOTE — Progress Notes (Signed)
Office Visit  BP (!) 162/70   Pulse 72   Temp 98 F (36.7 C) (Oral)   Wt 207 lb 6.4 oz (94.1 kg)   SpO2 97%   BMI 34.51 kg/m    Subjective:    Patient ID: [name], female    DOB: [birth year], 57 y.o.   MRN: [id number]  HPI: [name] is a 57 y.o. female  Chief Complaint  Patient presents with   Foot Problem    Patient states her L foot has a callus on the bottom. States she burnt her feet 7 years ago and ever since she has had this callus come up. States she used to go to the podiatrist but is no longer seeing them. States it did blister and bust, worried that it may be getting infected.    #L foot problem  Has had callus at bottom of food for years Previously with podiatry, stopped following up Has managed at home and recently had blister that burst, had pus and redness (no resolved) Denies fevers, chills, nausea, vomiting Wants to re-establish w podiatry  Relevant past medical, surgical, family and social history reviewed and updated as indicated. Interim medical history since our last visit reviewed. Allergies and medications reviewed and updated.  ROS per HPI unless specifically indicated above     Objective:    BP (!) 162/70   Pulse 72   Temp 98 F (36.7 C) (Oral)   Wt 207 lb 6.4 oz (94.1 kg)   SpO2 97%   BMI 34.51 kg/m   Wt Readings from Last 3 Encounters:  10/07/23 207 lb 6.4 oz (94.1 kg)  09/16/23 207 lb 3.7 oz (94 kg)  06/08/23 213 lb (96.6 kg)     Physical Exam Constitutional:      Appearance: Normal appearance.  HENT:     Head: Normocephalic and atraumatic.  Cardiovascular:     Rate and Rhythm: Normal rate and regular rhythm.     Pulses: Normal pulses.     Heart sounds: Normal heart sounds.  Pulmonary:     Effort: Pulmonary effort is normal.     Breath sounds: Normal breath sounds.  Musculoskeletal:        General: Normal range of motion.     Cervical back: Normal range of motion.     Left foot: Deformity present.        Feet:     Comments: Ulceration with various stages of healing, no eryhtema, pus expressed today, not tender  Skin:    General: Skin is warm and dry.     Capillary Refill: Capillary refill takes less than 2 seconds.  Neurological:     General: No focal deficit present.     Mental Status: She is alert. Mental status is at baseline.  Psychiatric:        Mood and Affect: Mood normal.        Behavior: Behavior normal.        Assessment & Plan:  Assessment & Plan   Diabetic ulcer of left midfoot associated with type 2 diabetes mellitus, unspecified ulcer stage (HCC) Chronic. No active infection based on exam. Otherwise well appearing. Plan on urgent podiatry referral for evaluation. Pt given strict return precautions and anticipatory guidance for ED. -     Ambulatory referral to Podiatry  Hypertension associated with diabetes [facility]) Assessment & Plan: Per patient, goal BP is 150/90. She has upcoming cardiology and nephrology follow up. Declines further treatment discussion at this time. Follow  up plan: Return if symptoms worsen or fail to improve.  Sheanna Dail Howell Pringle, MD

## 2023-10-07 NOTE — Patient Instructions (Addendum)
Dr Ether Griffins on 10/14/23 @10am  for podiatry evaluation [facility]. Address: [address] [place], [place], [place] [address]   Please discuss blood pressure control with your cardiologist

## 2023-10-14 ENCOUNTER — Ambulatory Visit: Payer: 59 | Attending: Medical

## 2023-10-14 DIAGNOSIS — R42 Dizziness and giddiness: Secondary | ICD-10-CM

## 2023-10-14 DIAGNOSIS — R55 Syncope and collapse: Secondary | ICD-10-CM

## 2023-10-15 ENCOUNTER — Other Ambulatory Visit: Payer: Self-pay | Admitting: Nurse Practitioner

## 2023-10-15 DIAGNOSIS — E1122 Type 2 diabetes mellitus with diabetic chronic kidney disease: Secondary | ICD-10-CM

## 2023-10-15 LAB — ECHOCARDIOGRAM COMPLETE
Area-P 1/2: 3.77 cm2
MV M vel: 6.85 m/s
MV Peak grad: 187.7 mm[Hg]
Radius: 0.3 cm
S' Lateral: 3.6 cm

## 2023-10-15 NOTE — Telephone Encounter (Signed)
Labs in date  Requested Prescriptions  Pending Prescriptions Disp Refills   insulin glargine (LANTUS) 100 UNIT/ML injection [Pharmacy Med Name: LANTUS 100 UNIT/ML VIAL] 10 mL 0    Sig: INJECT 15 UNITS INTO THE SKIN DAILY.     Endocrinology:  Diabetes - Insulins Failed - 10/15/2023  1:43 AM      Failed - HBA1C is between 0 and 7.9 and within 180 days    Hemoglobin A1C  Date Value Ref Range Status  07/20/2021 8.2  Final    Comment:    [facility]   HB A1C (BAYER DCA - WAIVED)  Date Value Ref Range Status  04/13/2023 7.4 (H) 4.8 - 5.6 % Final    Comment:             Prediabetes: 5.7 - 6.4          Diabetes: >6.4          Glycemic control for adults with diabetes: <7.0          Passed - Valid encounter within last 6 months    Recent Outpatient Visits           1 week ago Diabetic ulcer of left midfoot associated with type 2 diabetes mellitus, unspecified ulcer stage [facility])   [place] [facility] Jackolyn Confer, MD   5 months ago Type 2 diabetes mellitus with ESRD (end-stage renal disease) (HCC)   [place] [facility] [place], Jolene T, NP   6 months ago Type 2 diabetes mellitus with ESRD (end-stage renal disease) (HCC)   [place] [facility] [place], Corrie Dandy T, NP   1 year ago Diabetic polyneuropathy associated with type 2 diabetes mellitus (HCC)   [place] [facility], Corrie Dandy T, NP   1 year ago Hypertension associated with diabetes (HCC)   [place] [facility] [place], Dorie Rank, NP       Future Appointments             In 3 days Cannady, Dorie Rank, NP  [facility], [facility]

## 2023-10-18 ENCOUNTER — Ambulatory Visit: Payer: 59 | Admitting: Nurse Practitioner

## 2023-10-18 DIAGNOSIS — E1169 Type 2 diabetes mellitus with other specified complication: Secondary | ICD-10-CM

## 2023-10-18 DIAGNOSIS — I152 Hypertension secondary to endocrine disorders: Secondary | ICD-10-CM

## 2023-10-18 DIAGNOSIS — F3341 Major depressive disorder, recurrent, in partial remission: Secondary | ICD-10-CM

## 2023-10-18 DIAGNOSIS — G894 Chronic pain syndrome: Secondary | ICD-10-CM

## 2023-10-18 DIAGNOSIS — F411 Generalized anxiety disorder: Secondary | ICD-10-CM

## 2023-10-18 DIAGNOSIS — E1122 Type 2 diabetes mellitus with diabetic chronic kidney disease: Secondary | ICD-10-CM

## 2023-10-18 DIAGNOSIS — I739 Peripheral vascular disease, unspecified: Secondary | ICD-10-CM

## 2023-10-18 DIAGNOSIS — E559 Vitamin D deficiency, unspecified: Secondary | ICD-10-CM

## 2023-10-18 DIAGNOSIS — N186 End stage renal disease: Secondary | ICD-10-CM

## 2023-10-18 DIAGNOSIS — L4050 Arthropathic psoriasis, unspecified: Secondary | ICD-10-CM

## 2023-10-18 DIAGNOSIS — E1142 Type 2 diabetes mellitus with diabetic polyneuropathy: Secondary | ICD-10-CM

## 2023-10-28 ENCOUNTER — Encounter: Payer: Self-pay | Admitting: Nurse Practitioner

## 2023-11-01 ENCOUNTER — Other Ambulatory Visit: Payer: Self-pay | Admitting: Nurse Practitioner

## 2023-11-01 DIAGNOSIS — E1122 Type 2 diabetes mellitus with diabetic chronic kidney disease: Secondary | ICD-10-CM

## 2023-11-02 NOTE — Telephone Encounter (Signed)
Requested Prescriptions  Pending Prescriptions Disp Refills   NOVOLOG 100 UNIT/ML injection [Pharmacy Med Name: NOVOLOG 100 UNIT/ML VIAL] 30 mL 1    Sig: INJECT 5 TO 10 UNITS INTO THE SKIN 3 TIMES A DAY BEFORE MEALS     Endocrinology:  Diabetes - Insulins Failed - 11/01/2023  3:44 PM      Failed - HBA1C is between 0 and 7.9 and within 180 days    Hemoglobin A1C  Date Value Ref Range Status  07/20/2021 8.2  Final    Comment:    [facility]   HB A1C (BAYER DCA - WAIVED)  Date Value Ref Range Status  04/13/2023 7.4 (H) 4.8 - 5.6 % Final    Comment:             Prediabetes: 5.7 - 6.4          Diabetes: >6.4          Glycemic control for adults with diabetes: <7.0          Passed - Valid encounter within last 6 months    Recent Outpatient Visits           3 weeks ago Diabetic ulcer of left midfoot associated with type 2 diabetes mellitus, unspecified ulcer stage (HCC)   [place] [facility] Jackolyn Confer, MD   5 months ago Type 2 diabetes mellitus with ESRD (end-stage renal disease) (HCC)   [place] [facility] [place], Corrie Dandy T, NP   6 months ago Type 2 diabetes mellitus with ESRD (end-stage renal disease) (HCC)   [place] [facility] [place], Corrie Dandy T, NP   1 year ago Diabetic polyneuropathy associated with type 2 diabetes mellitus (HCC)   [place] [facility] [place], Corrie Dandy T, NP   1 year ago Hypertension associated with diabetes (HCC)   [place] [facility] [place], Dorie Rank, NP       Future Appointments             In 1 week Cannady, Dorie Rank, NP [place] [facility]             omeprazole (PRILOSEC) 40 MG capsule [Pharmacy Med Name: OMEPRAZOLE DR 40 MG CAPSULE] 90 capsule 1    Sig: TAKE 1 CAPSULE BY MOUTH EVERY DAY     Gastroenterology: Proton Pump Inhibitors Passed - 11/01/2023  3:44 PM      Passed - Valid encounter within last 12 months    Recent  Outpatient Visits           3 weeks ago Diabetic ulcer of left midfoot associated with type 2 diabetes mellitus, unspecified ulcer stage (HCC)   [place] [facility] Jackolyn Confer, MD   5 months ago Type 2 diabetes mellitus with ESRD (end-stage renal disease) (HCC)   [place] [facility] [place], Jolene T, NP   6 months ago Type 2 diabetes mellitus with ESRD (end-stage renal disease) (HCC)   [place] [facility] [place], Corrie Dandy T, NP   1 year ago Diabetic polyneuropathy associated with type 2 diabetes mellitus (HCC)   [place] [facility] [place], Corrie Dandy T, NP   1 year ago Hypertension associated with diabetes (HCC)    [facility] [place], Dorie Rank, NP       Future Appointments             In 1 week Marjie Skiff, NP [facility]  [facility], [facility]

## 2023-11-07 ENCOUNTER — Other Ambulatory Visit: Payer: Self-pay | Admitting: Nurse Practitioner

## 2023-11-07 DIAGNOSIS — E1122 Type 2 diabetes mellitus with diabetic chronic kidney disease: Secondary | ICD-10-CM

## 2023-11-08 NOTE — Telephone Encounter (Signed)
Requested medication (s) are due for refill today:yes}  Requested medication (s) are on the active medication list:yes  Last refill:  10/15/23 10 ml  Future visit scheduled: yes  Notes to clinic:  pharmacy requesting 90 day refills   Requested Prescriptions  Pending Prescriptions Disp Refills   LANTUS 100 UNIT/ML injection [Pharmacy Med Name: LANTUS 100 UNIT/ML VIAL] 30 mL 1    Sig: INJECT 15 UNITS INTO THE SKIN DAILY.     Endocrinology:  Diabetes - Insulins Failed - 11/07/2023 10:31 AM      Failed - HBA1C is between 0 and 7.9 and within 180 days    Hemoglobin A1C  Date Value Ref Range Status  07/20/2021 8.2  Final    Comment:    [facility]   HB A1C (BAYER DCA - WAIVED)  Date Value Ref Range Status  04/13/2023 7.4 (H) 4.8 - 5.6 % Final    Comment:             Prediabetes: 5.7 - 6.4          Diabetes: >6.4          Glycemic control for adults with diabetes: <7.0          Passed - Valid encounter within last 6 months    Recent Outpatient Visits           1 month ago Diabetic ulcer of left midfoot associated with type 2 diabetes mellitus, unspecified ulcer stage [facility])   [place] [facility] Jackolyn Confer, MD   6 months ago Type 2 diabetes mellitus with ESRD (end-stage renal disease) (HCC)   [place] [facility] [place], Jolene T, NP   6 months ago Type 2 diabetes mellitus with ESRD (end-stage renal disease) (HCC)   [place] [facility] [place], Corrie Dandy T, NP   1 year ago Diabetic polyneuropathy associated with type 2 diabetes mellitus (HCC)   [place] [facility] [place], Corrie Dandy T, NP   1 year ago Hypertension associated with diabetes (HCC)   [place] [facility] [place], Dorie Rank, NP       Future Appointments             In 3 days Cannady, Dorie Rank, NP [place] [facility], [facility]

## 2023-11-11 ENCOUNTER — Ambulatory Visit: Payer: 59 | Admitting: Nurse Practitioner

## 2023-11-11 DIAGNOSIS — I152 Hypertension secondary to endocrine disorders: Secondary | ICD-10-CM

## 2023-11-11 DIAGNOSIS — E113593 Type 2 diabetes mellitus with proliferative diabetic retinopathy without macular edema, bilateral: Secondary | ICD-10-CM

## 2023-11-11 DIAGNOSIS — B182 Chronic viral hepatitis C: Secondary | ICD-10-CM

## 2023-11-11 DIAGNOSIS — M0579 Rheumatoid arthritis with rheumatoid factor of multiple sites without organ or systems involvement: Secondary | ICD-10-CM

## 2023-11-11 DIAGNOSIS — F411 Generalized anxiety disorder: Secondary | ICD-10-CM

## 2023-11-11 DIAGNOSIS — E1142 Type 2 diabetes mellitus with diabetic polyneuropathy: Secondary | ICD-10-CM

## 2023-11-11 DIAGNOSIS — N186 End stage renal disease: Secondary | ICD-10-CM

## 2023-11-11 DIAGNOSIS — E1122 Type 2 diabetes mellitus with diabetic chronic kidney disease: Secondary | ICD-10-CM

## 2023-11-11 DIAGNOSIS — F3341 Major depressive disorder, recurrent, in partial remission: Secondary | ICD-10-CM

## 2023-11-11 DIAGNOSIS — K551 Chronic vascular disorders of intestine: Secondary | ICD-10-CM

## 2023-11-11 DIAGNOSIS — E1169 Type 2 diabetes mellitus with other specified complication: Secondary | ICD-10-CM

## 2023-11-11 DIAGNOSIS — M791 Myalgia, unspecified site: Secondary | ICD-10-CM

## 2023-11-12 ENCOUNTER — Encounter: Payer: Self-pay | Admitting: Nurse Practitioner

## 2023-11-12 ENCOUNTER — Ambulatory Visit (INDEPENDENT_AMBULATORY_CARE_PROVIDER_SITE_OTHER): Payer: 59 | Admitting: Nurse Practitioner

## 2023-11-12 VITALS — BP 114/68 | HR 68 | Temp 98.6°F | Ht 65.0 in | Wt 208.6 lb

## 2023-11-12 DIAGNOSIS — E66812 Obesity, class 2: Secondary | ICD-10-CM

## 2023-11-12 DIAGNOSIS — E1169 Type 2 diabetes mellitus with other specified complication: Secondary | ICD-10-CM

## 2023-11-12 DIAGNOSIS — Z992 Dependence on renal dialysis: Secondary | ICD-10-CM

## 2023-11-12 DIAGNOSIS — F411 Generalized anxiety disorder: Secondary | ICD-10-CM

## 2023-11-12 DIAGNOSIS — E1159 Type 2 diabetes mellitus with other circulatory complications: Secondary | ICD-10-CM | POA: Diagnosis not present

## 2023-11-12 DIAGNOSIS — I152 Hypertension secondary to endocrine disorders: Secondary | ICD-10-CM

## 2023-11-12 DIAGNOSIS — E1142 Type 2 diabetes mellitus with diabetic polyneuropathy: Secondary | ICD-10-CM

## 2023-11-12 DIAGNOSIS — E1122 Type 2 diabetes mellitus with diabetic chronic kidney disease: Secondary | ICD-10-CM | POA: Diagnosis not present

## 2023-11-12 DIAGNOSIS — F3341 Major depressive disorder, recurrent, in partial remission: Secondary | ICD-10-CM

## 2023-11-12 DIAGNOSIS — I739 Peripheral vascular disease, unspecified: Secondary | ICD-10-CM

## 2023-11-12 DIAGNOSIS — M0579 Rheumatoid arthritis with rheumatoid factor of multiple sites without organ or systems involvement: Secondary | ICD-10-CM

## 2023-11-12 DIAGNOSIS — N186 End stage renal disease: Secondary | ICD-10-CM

## 2023-11-12 DIAGNOSIS — T466X5A Adverse effect of antihyperlipidemic and antiarteriosclerotic drugs, initial encounter: Secondary | ICD-10-CM

## 2023-11-12 DIAGNOSIS — E559 Vitamin D deficiency, unspecified: Secondary | ICD-10-CM

## 2023-11-12 LAB — BAYER DCA HB A1C WAIVED: HB A1C (BAYER DCA - WAIVED): 6.5 % — ABNORMAL HIGH (ref 4.8–5.6)

## 2023-11-12 MED ORDER — LEFLUNOMIDE 10 MG PO TABS: 10.0000 mg | ORAL_TABLET | Freq: Every day | ORAL | 4 refills | Status: DC

## 2023-11-12 NOTE — Assessment & Plan Note (Signed)
Chronic, ongoing.  Did not tolerate any statins due to myalgia, even on reduced dosing.  Did not tolerate Zetia.  Discussed starting Repatha, she wishes to think about this and will alert provider if wishes to trial. 

## 2023-11-12 NOTE — Assessment & Plan Note (Signed)
Refer to depression plan of care.

## 2023-11-12 NOTE — Assessment & Plan Note (Addendum)
Chronic, ongoing.  A1c 7.4% last visit, will recheck today.  Continue Lantus and Novolog as ordered and adjust if needed in future + we may continue Ozempic, she wants to see what A1c is first.  Has lost 27 pounds with this, but makes her very nauseous.  No family history of thyroid cancer (MTC, MEN 2, thyroid cell tumors) or pancreatitis.  Discussed with her she may need to lower insulin dosing and to monitor sugars closely. Recommend she check blood sugar 3-4 times a day, may benefit from Freestyle in future.  Focus on diabetic diet at home.   - Foot and eye exams up to date - Vaccinations up to date - No ACE/ARB due to hyperkalemia, no statin due to severe myalgias. - Continue to collaborate with podiatry as currently has ulcerations to both feet

## 2023-11-12 NOTE — Assessment & Plan Note (Signed)
Chronic, ongoing.  Did not tolerate any statins due to myalgia, even on reduced dosing.  Did not tolerate Zetia.  Discussed starting Repatha, she wishes to think about this and will alert provider if wishes to trial.

## 2023-11-12 NOTE — Patient Instructions (Signed)

## 2023-11-12 NOTE — Assessment & Plan Note (Signed)
Chronic, taking supplement on occasion.  Recheck today.

## 2023-11-12 NOTE — Assessment & Plan Note (Signed)
Chronic, ongoing.  A1c 7.4% last visit, will recheck today.  Continue Lantus and Novolog as ordered and adjust if needed in future + we may continue Ozempic, she wants to see what A1c is first.  Has lost 27 pounds with this, but makes her very nauseous.  No family history of thyroid cancer (MTC, MEN 2, thyroid cell tumors) or pancreatitis.  Discussed with her she may need to lower insulin dosing and to monitor sugars closely. Recommend she check blood sugar 3-4 times a day, may benefit from Freestyle in future.  Focus on diabetic diet at home.   - Foot and eye exams up to date - Vaccinations up to date - No ACE/ARB due to hyperkalemia, no statin due to severe myalgias.

## 2023-11-12 NOTE — Assessment & Plan Note (Signed)
BMI 34.71.  Recommended eating smaller high protein, low fat meals more frequently and exercising 30 mins a day 5 times a week with a goal of 10-15lb weight loss in the next 3 months. Patient voiced their understanding and motivation to adhere to these recommendations.

## 2023-11-12 NOTE — Assessment & Plan Note (Signed)
Chronic, stable.  Continue to monitor and continue to assess for skin breakdown.

## 2023-11-12 NOTE — Assessment & Plan Note (Signed)
Chronic, ongoing. Denies SI/HI.  She does not want maintenance medication.  Would avoid benzos due to her opioid use for pain, discussed at length with her.  She would benefit from a maintenance regimen in future that is renal/liver dosed, but declines today.

## 2023-11-12 NOTE — Progress Notes (Signed)
Contacted via MyChart   A1c is remaining stable with level at 6.5%, coming down from previous 7.4% with Ozempic on board.

## 2023-11-12 NOTE — Assessment & Plan Note (Signed)
Chronic, ongoing.  Continue collaboration with nephrology and current dialysis regimen. Labs today. ?

## 2023-11-12 NOTE — Assessment & Plan Note (Signed)
Chronic,ongoing.  Followed by rheumatology and pain management, continue these collaborations.  Recent notes reviewed.  Continue medications as prescribed by them.  Will refill her medication today per request.

## 2023-11-12 NOTE — Assessment & Plan Note (Signed)
Chronic, ongoing with initial BP elevated -- repeat is trending down and previous readings + home better.  She does run low during dialysis -- continue Midodrine as needed in dialysis.  Continue to collaborate with cardiology, recommend she follow-up with them.  Can not take ACE/ARB due to underlying hyperkalemia.  Continue Diltiazem and Torsemide at current doses.  Has had multiple adverse affects to BP medications.  Focus on DASH diet at home.  Check BP daily and document for provider visits.  Labs: CMP, TSH.

## 2023-11-12 NOTE — Progress Notes (Addendum)
BP 114/68 (BP Location: Left Arm, Patient Position: Sitting, Cuff Size: Normal)   Pulse 68   Temp 98.6 F (37 C) (Oral)   Ht 5\' 5"  (1.651 m)   Wt 208 lb 9.6 oz (94.6 kg)   SpO2 97%   BMI 34.71 kg/m    Subjective:    Patient ID: [name], female    DOB: 12/10/1966, 57 y.o.   MRN: [id number]  HPI: [name] is a 57 y.o. female  Chief Complaint  Patient presents with   Diabetes   ozempic   Labs Only   Medication Refill    [facility]   DIABETES A1c April 7.4% -- taking Lantus and Novolog + Ozempic (started in April, makes her very nauseous). Prefers vials vs pens -- that way she can see how much she is getting due to vision loss.  Has lost 27 lbs with Ozempic.  Is following with podiatry, had visit yesterday.  He performed debridement on ulcer left foot and right great toe + was recommended to start IV antibiotics at dialysis due to cellulitis -- is currently receiving. Hypoglycemic episodes:no Polydipsia/polyuria: no Visual disturbance: no Chest pain: no Paresthesias: yes Glucose Monitoring: no             Accucheck frequency: occasional             Fasting glucose: 120 -140             Post prandial:             Evening:             Before meals: Taking Insulin?: yes             Long acting insulin: 10 unit Lantus             Short acting insulin: Novolog 5 units Blood Pressure Monitoring: a few times a week Retinal Examination: Up to Date, every 10 weeks -- glaucoma left eye and retinopathy both eyes -- gets injections both eyes -- [facility]  Foot Exam: Up to Date Pneumovax: Up to Date Influenza: Up to Date Aspirin: no    CHRONIC KIDNEY DISEASE ESRD followed by nephrology.  Started on dialysis >7 years ago. Goes to dialysis Monday, Wednesday, Friday.  BP can be elevated in office, but at dialysis her BP will drop. CKD status: stable Medications renally dose: yes Previous renal evaluation: yes Pneumovax:  Up to Date Influenza Vaccine:  Up to Date     HYPERTENSION / HYPERLIPIDEMIA Follows with cardiology, last visit 06/08/23 - to wear compression for orthostatic BP levels & they performed echo and Zio. Continues on Diltiazem and Coreg + no current statin -- gave her muscle pain.  We discussed Repatha.  EF on echo 10/14/23 -- 60 to 65%.  Tried Hydralazine in past = dry mouth + BP drop.  Clonidine, causes dry mouth.  Has been on ACE/ARB in past, hyperkalemia at baseline -- can not take these. She takes Midodrine with her to dialysis due to low BP with dialysis.     Satisfied with current treatment? yes Duration of hypertension: chronic BP monitoring frequency: a few times a week BP range:  BP medication side effects: no Past BP meds: multiple Duration of hyperlipidemia: chronic Past cholesterol medications: Zetia and statins Medication compliance: good compliance Aspirin: no Recent stressors: no Recurrent headaches: no Visual changes: no Palpitations: no Dyspnea: no Chest pain: no Lower extremity edema: with cellulitis at present to left leg Dizzy/lightheaded: no  RHEUMATOID ARTHRITIS Followed by rheumatology with infusions -- last visit 02/23/23 and last infusion 07/20/23. Follows with chronic pain clinic, last visit 09/16/23.  She would like to come off opioids.    She is no longer going for infusions due to them infiltrating arm in July, so she wishes not to go back.  Can not get to neurology outside [place]. Duration: chronic Pain: yes Symmetric: yes, 5/10 Quality: dull, aching, and throbbing Frequency: constant Context:  fluctuating Aggravating factors: movement Alleviating factors: Norco Relief with NSAIDs?: No NSAIDs Taken Treatments attempted: as above and on chart  ANXIETY/STRESS No current medications for mood.  Buspar made her feel bad.  Mood varies day to day. Duration:stable Anxious mood: yes Excessive worrying: yes Irritability: yes  Sweating: no Nausea: no Palpitations:no Hyperventilation: no Panic  attacks:  no Agoraphobia: no  Obscessions/compulsions: no Depressed mood: yes    2023/11/25    1:32 PM 09/16/2023    8:11 AM 06/08/2023   11:18 AM 05/11/2023   10:25 AM 12/17/2022    9:41 AM  Depression screen PHQ 2/9  Decreased Interest  0 0 0 0  Down, Depressed, Hopeless 0 0 0 1 0  PHQ - 2 Score 0 0 0 1 0  Altered sleeping 1   2   Tired, decreased energy 1   2   Change in appetite 0   2   Feeling bad or failure about yourself  0   3   Trouble concentrating 0   0   Moving slowly or fidgety/restless 0   2   Suicidal thoughts 0   0   PHQ-9 Score 2   12   Difficult doing work/chores Not difficult at all   Somewhat difficult   Anhedonia: no Weight changes: no Insomnia: yes hard to fall asleep  Hypersomnia: no Fatigue/loss of energy: yes Feelings of worthlessness: no Feelings of guilt: yes Impaired concentration/indecisiveness: no Suicidal ideations: no  Crying spells: yes Recent Stressors/Life Changes: yes   Relationship problems: no   Family stress: yes     Financial stress: no    Job stress: no    Recent death/loss: no     11-25-2023    1:32 PM 05/11/2023   10:26 AM 04/16/2022   10:15 AM 03/18/2022   10:34 AM  GAD 7 : Generalized Anxiety Score  Nervous, Anxious, on Edge 2 3 2  0  Control/stop worrying 1 3 2  0  Worry too much - different things 2 3 2  0  Trouble relaxing 0 2 2 0  Restless 0 2 2 0  Easily annoyed or irritable 2 2 2  0  Afraid - awful might happen 1 2 1  0  Total GAD 7 Score 8 17 13  0  Anxiety Difficulty Somewhat difficult Somewhat difficult Somewhat difficult    Relevant past medical, surgical, family and social history reviewed and updated as indicated. Interim medical history since our last visit reviewed. Allergies and medications reviewed and updated.  Review of Systems  Constitutional:  Negative for activity change, appetite change, diaphoresis, fatigue and fever.  Respiratory:  Negative for cough, chest tightness and shortness of breath.    Cardiovascular:  Negative for chest pain, palpitations and leg swelling.  Gastrointestinal: Negative.   Endocrine: Negative for cold intolerance, heat intolerance, polydipsia, polyphagia and polyuria.  Neurological:  Negative for dizziness, syncope, weakness, light-headedness, numbness and headaches.  Psychiatric/Behavioral:  Positive for sleep disturbance. Negative for decreased concentration, self-injury and suicidal ideas. The patient is nervous/anxious.     Per  HPI unless specifically indicated above     Objective:    BP 114/68 (BP Location: Left Arm, Patient Position: Sitting, Cuff Size: Normal)   Pulse 68   Temp 98.6 F (37 C) (Oral)   Ht 5\' 5"  (1.651 m)   Wt 208 lb 9.6 oz (94.6 kg)   SpO2 97%   BMI 34.71 kg/m   Wt Readings from Last 3 Encounters:  11/12/23 208 lb 9.6 oz (94.6 kg)  10/07/23 207 lb 6.4 oz (94.1 kg)  09/16/23 207 lb 3.7 oz (94 kg)    Physical Exam Vitals and nursing note reviewed.  Constitutional:      General: She is awake. She is not in acute distress.    Appearance: She is well-developed and well-groomed. She is not ill-appearing or toxic-appearing.  HENT:     Head: Normocephalic.     Right Ear: Hearing and external ear normal.     Left Ear: Hearing and external ear normal.  Eyes:     General: Lids are normal.        Right eye: No discharge.        Left eye: No discharge.     Conjunctiva/sclera: Conjunctivae normal.     Pupils: Pupils are equal, round, and reactive to light.  Neck:     Thyroid: No thyromegaly.     Vascular: No carotid bruit.  Cardiovascular:     Rate and Rhythm: Normal rate and regular rhythm.     Heart sounds: Normal heart sounds. No murmur heard.    No gallop.  Pulmonary:     Effort: Pulmonary effort is normal. No accessory muscle usage or respiratory distress.     Breath sounds: Normal breath sounds.  Abdominal:     General: Bowel sounds are normal. There is no distension.     Palpations: Abdomen is soft.      Tenderness: There is no abdominal tenderness.  Musculoskeletal:     Cervical back: Normal range of motion and neck supple.     Right lower leg: No edema.     Left lower leg: No edema.  Lymphadenopathy:     Cervical: No cervical adenopathy.  Skin:    General: Skin is warm and dry.  Neurological:     Mental Status: She is alert and oriented to person, place, and time.     Deep Tendon Reflexes: Reflexes are normal and symmetric.     Reflex Scores:      Brachioradialis reflexes are 2+ on the right side and 2+ on the left side.      Patellar reflexes are 2+ on the right side and 2+ on the left side. Psychiatric:        Attention and Perception: Attention normal.        Mood and Affect: Mood normal.        Speech: Speech normal.        Behavior: Behavior normal. Behavior is cooperative.        Thought Content: Thought content normal.    Results for orders placed or performed in visit on 10/14/23  ECHOCARDIOGRAM COMPLETE  Result Value Ref Range   S' Lateral 3.60 cm   Area-P 1/2 3.77 cm2   MV M vel 6.85 m/s   MV Peak grad 187.7 mmHg   Radius 0.30 cm   Est EF 60 - 65%       Assessment & Plan:   Problem List Items Addressed This Visit       Cardiovascular and Mediastinum  Hypertension associated with diabetes (HCC)    Chronic, ongoing with initial BP elevated -- repeat is trending down and previous readings + home better.  She does run low during dialysis -- continue Midodrine as needed in dialysis.  Continue to collaborate with cardiology, recommend she follow-up with them.  Can not take ACE/ARB due to underlying hyperkalemia.  Continue Diltiazem and Torsemide at current doses.  Has had multiple adverse affects to BP medications.  Focus on DASH diet at home.  Check BP daily and document for provider visits.  Labs: CMP, TSH.      Relevant Orders   Bayer DCA Hb A1c Waived   Magnesium   TSH   PAD (peripheral artery disease) (HCC)    Chronic, stable.  Continue to monitor and  continue to assess for skin breakdown.          Endocrine   Diabetic polyneuropathy associated with type 2 diabetes mellitus (HCC)    Chronic, ongoing.  A1c 7.4% last visit, will recheck today.  Continue Lantus and Novolog as ordered and adjust if needed in future + we may continue Ozempic, she wants to see what A1c is first.  Has lost 27 pounds with this, but makes her very nauseous.  No family history of thyroid cancer (MTC, MEN 2, thyroid cell tumors) or pancreatitis.  Discussed with her she may need to lower insulin dosing and to monitor sugars closely. Recommend she check blood sugar 3-4 times a day, may benefit from Freestyle in future.  Focus on diabetic diet at home.   - Foot and eye exams up to date - Vaccinations up to date - No ACE/ARB due to hyperkalemia, no statin due to severe myalgias. - Continue to collaborate with podiatry as currently has ulcerations to both feet      Relevant Orders   Bayer DCA Hb A1c Waived   Hyperlipidemia associated with type 2 diabetes mellitus (HCC)    Chronic, ongoing.  Did not tolerate any statins due to myalgia, even on reduced dosing.  Did not tolerate Zetia.  Discussed starting Repatha, she wishes to think about this and will alert provider if wishes to trial.      Relevant Orders   Bayer DCA Hb A1c Waived   Comprehensive metabolic panel   Lipid Panel w/o Chol/HDL Ratio   Type 2 diabetes mellitus with ESRD (end-stage renal disease) (HCC) - Primary    Chronic, ongoing.  A1c 7.4% last visit, will recheck today.  Continue Lantus and Novolog as ordered and adjust if needed in future + we may continue Ozempic, she wants to see what A1c is first.  Has lost 27 pounds with this, but makes her very nauseous.  No family history of thyroid cancer (MTC, MEN 2, thyroid cell tumors) or pancreatitis.  Discussed with her she may need to lower insulin dosing and to monitor sugars closely. Recommend she check blood sugar 3-4 times a day, may benefit from Freestyle  in future.  Focus on diabetic diet at home.   - Foot and eye exams up to date - Vaccinations up to date - No ACE/ARB due to hyperkalemia, no statin due to severe myalgias.      Relevant Orders   Bayer DCA Hb A1c Waived     Musculoskeletal and Integument   Rheumatoid arthritis involving multiple sites with positive rheumatoid factor (HCC)    Chronic,ongoing.  Followed by rheumatology and pain management, continue these collaborations.  Recent notes reviewed.  Continue medications as prescribed by them.  Will refill her medication today per request.      Relevant Medications   leflunomide (ARAVA) 10 MG tablet     Genitourinary   ESRD (end stage renal disease) on dialysis (HCC)    Chronic, ongoing.  Continue collaboration with nephrology and current dialysis regimen. Labs today.      Relevant Orders   Bayer DCA Hb A1c Waived   Comprehensive metabolic panel     Other   GAD (generalized anxiety disorder)    Refer to depression plan of care.      Myalgia due to statin    Chronic, ongoing.  Did not tolerate any statins due to myalgia, even on reduced dosing.  Did not tolerate Zetia.  Discussed starting Repatha, she wishes to think about this and will alert provider if wishes to trial.      Obesity    BMI 34.71.  Recommended eating smaller high protein, low fat meals more frequently and exercising 30 mins a day 5 times a week with a goal of 10-15lb weight loss in the next 3 months. Patient voiced their understanding and motivation to adhere to these recommendations.       Recurrent major depression in partial remission (HCC)    Chronic, ongoing. Denies SI/HI.  She does not want maintenance medication.  Would avoid benzos due to her opioid use for pain, discussed at length with her.  She would benefit from a maintenance regimen in future that is renal/liver dosed, but declines today.        Vitamin D deficiency    Chronic, taking supplement on occasion.  Recheck today.       Relevant Orders   VITAMIN D 25 Hydroxy (Vit-D Deficiency, Fractures)     Follow up plan: Return in about 3 months (around 02/12/2024) for T2DM, HTN/HLD, ESRD, DEPRESSION.

## 2023-11-13 LAB — COMPREHENSIVE METABOLIC PANEL
ALT: 16 [IU]/L (ref 0–32)
AST: 17 [IU]/L (ref 0–40)
Albumin: 3.8 g/dL (ref 3.8–4.9)
Alkaline Phosphatase: 117 [IU]/L (ref 44–121)
BUN/Creatinine Ratio: 8 — ABNORMAL LOW (ref 9–23)
BUN: 28 mg/dL — ABNORMAL HIGH (ref 6–24)
Bilirubin Total: 0.4 mg/dL (ref 0.0–1.2)
CO2: 25 mmol/L (ref 20–29)
Calcium: 8.6 mg/dL — ABNORMAL LOW (ref 8.7–10.2)
Chloride: 98 mmol/L (ref 96–106)
Creatinine, Ser: 3.65 mg/dL — ABNORMAL HIGH (ref 0.57–1.00)
Globulin, Total: 3.5 g/dL (ref 1.5–4.5)
Glucose: 203 mg/dL — ABNORMAL HIGH (ref 70–99)
Potassium: 4 mmol/L (ref 3.5–5.2)
Sodium: 139 mmol/L (ref 134–144)
Total Protein: 7.3 g/dL (ref 6.0–8.5)
eGFR: 14 mL/min/{1.73_m2} — ABNORMAL LOW (ref >59)

## 2023-11-13 LAB — LIPID PANEL W/O CHOL/HDL RATIO
Cholesterol, Total: 175 mg/dL (ref 100–199)
HDL: 32 mg/dL — ABNORMAL LOW (ref >39)
LDL Chol Calc (NIH): 115 mg/dL — ABNORMAL HIGH (ref 0–99)
Triglycerides: 158 mg/dL — ABNORMAL HIGH (ref 0–149)
VLDL Cholesterol Cal: 28 mg/dL (ref 5–40)

## 2023-11-13 LAB — VITAMIN D 25 HYDROXY (VIT D DEFICIENCY, FRACTURES): Vit D, 25-Hydroxy: 9.7 ng/mL — ABNORMAL LOW (ref 30.0–100.0)

## 2023-11-13 LAB — TSH: TSH: 0.905 u[IU]/mL (ref 0.450–4.500)

## 2023-11-13 LAB — MAGNESIUM: Magnesium: 1.7 mg/dL (ref 1.6–2.3)

## 2023-11-14 NOTE — Progress Notes (Signed)
Contacted via MyChart   Good morning [name], your labs have returned and overall remain at baseline for you.  LDL, bad cholesterol, is elevated and I know you can not take statins or Zetia.  I do recommend thinking about taking the cholesterol lowering injections like Repatha or Praluent which can often be better tolerated.  Vitamin D remains low, please ensure you are taking Vitaming D supplement daily.  Remainder of labs stable.  Any questions? Keep being amazing!!  Thank you for allowing me to participate in your care.  I appreciate you. Kindest regards, Andersyn Fragoso

## 2023-11-22 ENCOUNTER — Other Ambulatory Visit: Payer: Self-pay | Admitting: Nurse Practitioner

## 2023-11-22 ENCOUNTER — Encounter: Payer: Self-pay | Admitting: Nurse Practitioner

## 2023-11-22 DIAGNOSIS — E1122 Type 2 diabetes mellitus with diabetic chronic kidney disease: Secondary | ICD-10-CM

## 2023-11-22 MED ORDER — INSULIN GLARGINE 100 UNIT/ML ~~LOC~~ SOLN: SUBCUTANEOUS | 1 refills | Status: DC

## 2023-11-22 MED ORDER — OZEMPIC (0.25 OR 0.5 MG/DOSE) 2 MG/3ML ~~LOC~~ SOPN: 0.5000 mg | PEN_INJECTOR | SUBCUTANEOUS | 4 refills | Status: DC

## 2023-12-06 ENCOUNTER — Telehealth: Payer: Self-pay | Admitting: Medical

## 2023-12-06 ENCOUNTER — Telehealth: Payer: Self-pay | Admitting: Nurse Practitioner

## 2023-12-06 NOTE — Telephone Encounter (Signed)
Message from patient in myChart: Comments: Shortness of breath when laying down, chest pains with it. My kidney Dr said to try and get an appointment asap. I have dialysis Monday Wednesday and Friday from 5am til 10 am. Tues or Thurs early morning are best days but anything after 10 am on Wed or Fri will work also. Called patient to get more information regarding above message. Left voicemail for patient to call back.

## 2023-12-06 NOTE — Telephone Encounter (Signed)
Copied from CRM [id number]. Topic: Medicare AWV >> Dec 06, 2023  1:28 PM Payton Doughty wrote: Reason for CRM: Called LVM 12/06/2023 to schedule Annual Wellness Visit  Verlee Rossetti; Care Guide Ambulatory Clinical Support [place] [facility] Direct Dial: [phone]

## 2023-12-06 NOTE — Telephone Encounter (Signed)
Left message for patient to call back  

## 2023-12-07 ENCOUNTER — Ambulatory Visit: Payer: 59 | Attending: Cardiovascular Disease | Admitting: Cardiovascular Disease

## 2023-12-07 ENCOUNTER — Encounter: Payer: Self-pay | Admitting: Cardiovascular Disease

## 2023-12-07 ENCOUNTER — Encounter: Payer: Self-pay | Admitting: Nurse Practitioner

## 2023-12-07 VITALS — BP 140/58 | HR 68 | Ht 65.0 in | Wt 212.2 lb

## 2023-12-07 DIAGNOSIS — I340 Nonrheumatic mitral (valve) insufficiency: Secondary | ICD-10-CM

## 2023-12-07 DIAGNOSIS — E1122 Type 2 diabetes mellitus with diabetic chronic kidney disease: Secondary | ICD-10-CM

## 2023-12-07 DIAGNOSIS — I25110 Atherosclerotic heart disease of native coronary artery with unstable angina pectoris: Secondary | ICD-10-CM | POA: Diagnosis not present

## 2023-12-07 DIAGNOSIS — I10 Essential (primary) hypertension: Secondary | ICD-10-CM

## 2023-12-07 DIAGNOSIS — I5031 Acute diastolic (congestive) heart failure: Secondary | ICD-10-CM

## 2023-12-07 DIAGNOSIS — I200 Unstable angina: Secondary | ICD-10-CM

## 2023-12-07 MED ORDER — INSULIN ASPART 100 UNIT/ML IJ SOLN: INTRAMUSCULAR | 1 refills | Status: DC

## 2023-12-07 MED ORDER — ASPIRIN 81 MG PO TBEC: 81.0000 mg | DELAYED_RELEASE_TABLET | Freq: Every day | ORAL | Status: DC

## 2023-12-07 NOTE — Telephone Encounter (Signed)
 Appointment 12/17.

## 2023-12-07 NOTE — Progress Notes (Signed)
Cardiology Office Note   Date:  12/07/2023   ID:  Bellamae, Bignell [birth year], MRN [id number]  PCP:  Marjie Skiff, NP  Cardiologist:   Lorine Bears, MD   Chief Complaint  Patient presents with   Follow-up    Patient repots difficulty breathing while lying down at night.  The breathing does not improve if she sits up in chair.  No episodes during the day.      History of Present Illness: [name] is a 57 y.o. female who presents for for urgent evaluation regarding chest pain and shortness of breath. She has known history of end-stage renal disease on hemodialysis, COPD, prior tobacco use, type 2 diabetes, essential hypertension, hepatitis C, hyperlipidemia, peripheral arterial disease, anxiety and depression.  She has been seen since 2023 for resistant hypertension and intermittent chest pain.  She had a Lexiscan Myoview done in May 2023 which showed no significant perfusion defects.  CT attenuation images showed to heavy three-vessel coronary artery calcifications. She did have palpitations done and underwent outpatient monitor in July which showed mostly sinus rhythm with 1 run of supraventricular tachycardia lasting 15 beats.  Multiple triggered events did not correlate with arrhythmia. Echocardiogram done in October of this year showed normal LV systolic function, moderate mitral regurgitation and mild aortic insufficiency. She undergoes dialysis on Monday Wednesday and Friday.  Over the last 3 weeks, she experienced consistent symptoms of orthopnea, PND with associated substernal chest tightness.  The symptoms wake her up from sleep and then she has to sit in a recliner all night long to be able to sleep.  She has been undergoing dialysis on a regular basis and has been around her dry weight.    Past Medical History:  Diagnosis Date   Allergy    Anemia    Anxiety    worse when not at home - bowel incontinence   Arthritis    Ataxia    Chest pain    a. 10/2016  MV: EF 57%, no ischemia/infarct; b. 04/2022 MV: EF 43%, no ischemia/infarct. Heavy 3 vessel cor Ca2+ and mod aortic atherosclerosis.   COPD (chronic obstructive pulmonary disease) (HCC)    Depression    Diabetes mellitus with complication (HCC)    Diastolic dysfunction    a. 06/2016 Echo: EF 55-60%; b. 05/2017 Echo: EF 50-55%; c. 05/2018 Echo: EF 55-60%, no rwma, GrI DD, mild AI, mild-mod MR, mod dil LA. PASP ; d. 05/2022 Echo: EF 60-65%, no rwma, mild LVH, GrI DD, nl RV fxn, mild-mod dil LA, mild-mod MR, mild AI.   ESRD on hemodialysis [facility])    MWF dialysis   Fistula    left upper arm   Hepatitis 2003   Hep C   Hiatal hernia    Hypercholesterolemia    Hypertension    Migraine    Mitral regurgitation    a. 05/2022 Echo: mild-mod MR.   Neuropathy, diabetic (HCC)    lower legs   Non-alcoholic cirrhosis (HCC)    Palpitations    a. 06/2018 Zio: RSR, 74, rare PACs and PVCs.  No significant arrhythmia.   Peripheral vascular disease (HCC)    Pneumonia 2015   Psoriasis    Tobacco dependence    Wears dentures    full upper, partial lower    Past Surgical History:  Procedure Laterality Date   A/V FISTULAGRAM Left 04/23/2020   Procedure: A/V FISTULAGRAM;  Surgeon: Renford Dills, MD;  Location: [facility];  Service: Cardiovascular;  Laterality: Left;   A/V SHUNT INTERVENTION N/A 12/16/2017   Procedure: A/V SHUNT INTERVENTION;  Surgeon: Annice Needy, MD;  Location: [facility];  Service: Cardiovascular;  Laterality: N/A;   AQUEOUS SHUNT Left 12/20/2019   Procedure: AHMED TUBE SHUNT WITH TUTOPLAST AND AC WASHOUT LEFT DIABETIC;  Surgeon: Lockie Mola, MD;  Location: [facility];  Service: Ophthalmology;  Laterality: Left;  Diabetic - insulin   AV FISTULA PLACEMENT Left 11/2014   CESAREAN SECTION     CHOLECYSTECTOMY     COLONOSCOPY N/A 09/22/2017   Procedure: COLONOSCOPY;  Surgeon: Toney Reil, MD;  Location: [facility];  Service:  Endoscopy;  Laterality: N/A;   COLONOSCOPY WITH PROPOFOL N/A 04/16/2020   Procedure: COLONOSCOPY WITH PROPOFOL;  Surgeon: Midge Minium, MD;  Location: [facility] ENDOSCOPY;  Service: Endoscopy;  Laterality: N/A;  Priority 4   cyst removed  from left hand Left 1989   DIALYSIS/PERMA CATHETER INSERTION N/A 05/20/2017   Procedure: Dialysis/Perma Catheter Insertion and fistulagram/LUE angiogram;  Surgeon: Annice Needy, MD;  Location: [facility];  Service: Cardiovascular;  Laterality: N/A;   ESOPHAGOGASTRODUODENOSCOPY N/A 09/22/2017   Procedure: ESOPHAGOGASTRODUODENOSCOPY (EGD);  Surgeon: Toney Reil, MD;  Location: [facility];  Service: Endoscopy;  Laterality: N/A;   ESOPHAGOGASTRODUODENOSCOPY (EGD) WITH PROPOFOL N/A 12/04/2021   Procedure: ESOPHAGOGASTRODUODENOSCOPY (EGD) WITH PROPOFOL;  Surgeon: Midge Minium, MD;  Location: [facility] ENDOSCOPY;  Service: Endoscopy;  Laterality: N/A;   EYE SURGERY Right 09/02/2022   INCISION AND DRAINAGE ABSCESS N/A 07/16/2016   Procedure: INCISION AND DRAINAGE ABSCESS;  Surgeon: Bud Face, MD;  Location: [facility] ORS;  Service: ENT;  Laterality: N/A;   PERIPHERAL VASCULAR CATHETERIZATION N/A 07/25/2015   Procedure: A/V Shuntogram/Fistulagram;  Surgeon: Annice Needy, MD;  Location: [facility];  Service: Cardiovascular;  Laterality: N/A;   PERIPHERAL VASCULAR CATHETERIZATION Left 07/25/2015   Procedure: A/V Shunt Intervention;  Surgeon: Annice Needy, MD;  Location: [facility];  Service: Cardiovascular;  Laterality: Left;   PERIPHERAL VASCULAR CATHETERIZATION Left 10/07/2015   Procedure: A/V Shuntogram/Fistulagram;  Surgeon: Annice Needy, MD;  Location: [facility];  Service: Cardiovascular;  Laterality: Left;   PERIPHERAL VASCULAR CATHETERIZATION N/A 10/07/2015   Procedure: A/V Shunt Intervention;  Surgeon: Annice Needy, MD;  Location: [facility];  Service: Cardiovascular;  Laterality: N/A;   PERIPHERAL VASCULAR  CATHETERIZATION  10/07/2015   Procedure: Dialysis/Perma Catheter Insertion;  Surgeon: Annice Needy, MD;  Location: [facility];  Service: Cardiovascular;;   PERIPHERAL VASCULAR CATHETERIZATION N/A 12/17/2015   Procedure: Dialysis/Perma Catheter Removal;  Surgeon: Renford Dills, MD;  Location: [facility];  Service: Cardiovascular;  Laterality: N/A;   PERIPHERAL VASCULAR CATHETERIZATION N/A 01/11/2017   Procedure: Visceral Angiography;  Surgeon: Annice Needy, MD;  Location: [facility];  Service: Cardiovascular;  Laterality: N/A;   PERIPHERAL VASCULAR CATHETERIZATION N/A 01/11/2017   Procedure: Visceral Artery Intervention;  Surgeon: Annice Needy, MD;  Location: [facility];  Service: Cardiovascular;  Laterality: N/A;   POLYPECTOMY  09/22/2017   Procedure: POLYPECTOMY;  Surgeon: Toney Reil, MD;  Location: [facility];  Service: Endoscopy;;   rt. tubal and ovary removed     STENT PLACEMENT VASCULAR ([facility] H[facility]) Left 03/2018   Performed at [facility] using EV3 protg GPS stent graph [id number] lot [id number]   TONSILLECTOMY     UPPER EXTREMITY ANGIOGRAPHY Left 09/29/2019  Procedure: UPPER EXTREMITY ANGIOGRAPHY;  Surgeon: Renford Dills, MD;  Location: [facility];  Service: Cardiovascular;  Laterality: Left;     Current Outpatient Medications  Medication Sig Dispense Refill   Banana Flakes (BANATROL PLUS PO) Take by mouth. OTC to help with IBS     BD INSULIN SYRINGE U/F 30G X 1/2" 0.5 ML MISC USE 4 TIMES DAILY WITH LANTUS & NOVOLOG 100 each 3   brimonidine (ALPHAGAN) 0.2 % ophthalmic solution Place 1 drop into the left eye in the morning and at bedtime.      calcium carbonate (TUMS - DOSED IN MG ELEMENTAL CALCIUM) 500 MG chewable tablet Chew 1 tablet by mouth daily.     ciclopirox (PENLAC) 8 % solution Apply topically at bedtime. Apply over nail and surrounding skin. Apply daily over previous coat. After seven  (7) days, may remove with alcohol and continue cycle. 6.6 mL 4   diltiazem (CARDIZEM) 90 MG tablet Take 90 mg by mouth 4 (four) times daily.     dorzolamidel-timolol (COSOPT) 22.3-6.8 MG/ML SOLN ophthalmic solution Place 1 drop into the left eye 2 (two) times daily.      insulin aspart (NOVOLOG) 100 UNIT/ML injection INJECT 5 TO 10 UNITS INTO THE SKIN 3 TIMES A DAY BEFORE MEALS 30 mL 1   insulin glargine (LANTUS) 100 UNIT/ML injection INJECT 15 UNITS INTO THE SKIN DAILY. 30 mL 1   leflunomide (ARAVA) 10 MG tablet Take 1 tablet (10 mg total) by mouth daily. 90 tablet 4   lidocaine-prilocaine (EMLA) cream Apply 1 Application topically daily.     midodrine (PROAMATINE) 10 MG tablet TAKE 0.5-1 TABLETS (5-10 MG TOTAL) BY MOUTH DAILY AS NEEDED. 90 tablet 1   omeprazole (PRILOSEC) 40 MG capsule TAKE 1 CAPSULE BY MOUTH EVERY DAY 90 capsule 1   [name] test strip USE TO CHECK BLOOD SUGAR 3 TIMES DAILY, FASTING IN MORNING WITH GOAL <130 AND 2 HOURS AFTER MEALS WITH GOAL <180. BRING BLOOD SUGAR LOG TO VISITS. 100 strip 11   oxyCODONE (OXY IR/ROXICODONE) 5 MG immediate release tablet Take 1 tablet (5 mg total) by mouth every 8 (eight) hours as needed for severe pain. Must last 30 days. 75 tablet 0   promethazine (PHENERGAN) 25 MG tablet Take 1 tablet (25 mg total) by mouth 2 (two) times daily as needed. 90 tablet 4   Vitamin D, Ergocalciferol, (DRISDOL) 1.25 MG (50000 UNIT) CAPS capsule Take 50,000 Units by mouth 3 (three) times a week.     Incontinence Supply Disposable (FQ PROTECTIVE UNDERWEAR) MISC 1 CSE PU LARGE PLUS USING DAILY AS NEEDED ADD WIPES (Patient not taking: Reported on 12/07/2023) 72 each 11   lidocaine (LIDODERM) 5 % Place 1 patch onto the skin daily. Remove & Discard patch within 12 hours or as directed by MD (Patient not taking: Reported on 12/07/2023) 30 patch 0   oxyCODONE (OXY IR/ROXICODONE) 5 MG immediate release tablet Take 1 tablet (5 mg total) by mouth every 8 (eight) hours as  needed for severe pain. Must last 30 days. 75 tablet 0   oxyCODONE (OXY IR/ROXICODONE) 5 MG immediate release tablet Take 1 tablet (5 mg total) by mouth every 8 (eight) hours as needed for severe pain. Must last 30 days. 75 tablet 0   No current facility-administered medications for this visit.    Allergies:   Cinnamon, Garlic, Onion, Tylenol [acetaminophen], Losartan potassium, Amlodipine, Ciprofloxacin, Clonidine derivatives, Ginger, Hydralazine, Lisinopril, Metoprolol, Prednisone, and Statins    Social History:  The patient  reports that she quit smoking about 2 years ago. Her smoking use included cigarettes. She started smoking about 22 years ago. She has a 5 pack-year smoking history. She has never used smokeless tobacco. She reports that she does not drink alcohol and does not use drugs.   Family History:  The patient's family history includes Heart disease in her mother; Hypertension in her father and mother; Skin cancer in her father.    ROS:  Please see the history of present illness.   Otherwise, review of systems are positive for none.   All other systems are reviewed and negative.    PHYSICAL EXAM: VS:  BP (!) 140/58 (BP Location: Left Arm, Patient Position: Sitting, Cuff Size: Large)   Pulse 68   Ht 5\' 5"  (1.651 m)   Wt 212 lb 3.2 oz (96.3 kg)   SpO2 98%   BMI 35.31 kg/m  , BMI Body mass index is 35.31 kg/m. GEN: Well nourished, well developed, in no acute distress  HEENT: normal  Neck: no JVD, carotid bruits, or masses Cardiac: RRR; no murmurs, rubs, or gallops,no edema  Respiratory:  clear to auscultation bilaterally, normal work of breathing GI: soft, nontender, nondistended, + BS MS: no deformity or atrophy  Skin: warm and dry, no rash Neuro:  Strength and sensation are intact Psych: euthymic mood, full affect Dialysis vascular access is in the left arm.  Right radial pulse was not palpable.   EKG:  EKG is ordered today. The ekg ordered today demonstrates  : Normal sinus rhythm Moderate voltage criteria for LVH, may be normal variant ( R in aVL , Cornell product ) T wave abnormality, consider lateral ischemia    Recent Labs: 04/13/2023: Hemoglobin 11.1; Platelets 155 11/12/2023: ALT 16; BUN 28; Creatinine, Ser 3.65; Magnesium 1.7; Potassium 4.0; Sodium 139; TSH 0.905    Lipid Panel    Component Value Date/Time   CHOL 175 11/12/2023 1347   CHOL 157 10/15/2014 0402   TRIG 158 (H) 11/12/2023 1347   TRIG 83 10/15/2014 0402   HDL 32 (L) 11/12/2023 1347   HDL 49 10/15/2014 0402   VLDL 17 10/15/2014 0402   LDLCALC 115 (H) 11/12/2023 1347   LDLCALC 91 10/15/2014 0402      Wt Readings from Last 3 Encounters:  12/07/23 212 lb 3.2 oz (96.3 kg)  11/12/23 208 lb 9.6 oz (94.6 kg)  10/07/23 207 lb 6.4 oz (94.1 kg)          05/31/2018    1:57 PM  PAD Screen  Previous surgical procedure? Yes  Dates of procedures Performed at [facility] using EV3 protg GPS stent graph [id number] lot [id number]  Pain with walking? No  Feet/toe relief with dangling? No  Painful, non-healing ulcers? Yes  Extremities discolored? No      ASSESSMENT AND PLAN:  1.  Coronary artery disease involving native coronary arteries with unstable angina: The patient reports recent symptoms of nocturnal angina over the last few weeks.  Previous CT imaging showed heavy three-vessel coronary artery calcifications.  She has been diabetic for a long time and has been on dialysis for 9 years.  She is at high risk for left main and three-vessel coronary artery disease. In addition, she is having symptoms of heart failure manifested by orthopnea and PND and palliative undergoing regular dialysis and being around her dry weight. Due to all of that, I recommend proceeding with right and left cardiac catheterization and possible PCI.  I discussed  the procedure in details as well as risks and benefits. Planned access is via the right femoral artery and  vein. Asked her to start aspirin 81 mg daily.  2.  Hyperlipidemia: Intolerance to statins due to myalgia.  Most recent lipid profile showed an LDL of 115.  Will have to consider treatment with a PCSK9 inhibitor.  3.  End-stage renal disease on hemodialysis: She undergoes dialysis 3 times per week.  4.  moderate mitral regurgitation: This will have to be evaluated with a right heart catheterization.  5.  Orthostatic hypotension: Symptoms are stable.    Disposition:   Proceed with urgent right and left cardiac catheterization and follow-up after.  Signed,  Lorine Bears, MD  12/07/2023 4:33 PM    [facility]

## 2023-12-07 NOTE — Patient Instructions (Signed)
Medication Instructions:  START Aspirin 81 mg once daily  *If you need a refill on your cardiac medications before your next appointment, please call your pharmacy*   Lab Work: Your provider would like for you to have following labs drawn: CBC and BMET.   Please go to the [facility] entrance and check in at the front desk.  You do not need an appointment.  They are open from 7am-6 pm.   If you have labs (blood work) drawn today and your tests are completely normal, you will receive your results only by: MyChart Message (if you have MyChart) OR A paper copy in the mail If you have any lab test that is abnormal or we need to change your treatment, we will call you to review the results.   Testing/Procedures: Your physician has requested that you have a cardiac catheterization. Cardiac catheterization is used to diagnose and/or treat various heart conditions. Doctors may recommend this procedure for a number of different reasons. The most common reason is to evaluate chest pain. Chest pain can be a symptom of coronary artery disease (CAD), and cardiac catheterization can show whether plaque is narrowing or blocking your heart's arteries. This procedure is also used to evaluate the valves, as well as measure the blood flow and oxygen levels in different parts of your heart. For further information please visit [email or web address] Please follow instruction sheet, as given.    Follow-Up: At [facility], you and your health needs are our priority.  As part of our continuing mission to provide you with exceptional heart care, we have created designated Provider Care Teams.  These Care Teams include your primary Cardiologist (physician) and Advanced Practice Providers (APPs -  Physician Assistants and Nurse Practitioners) who all work together to provide you with the care you need, when you need it.  We recommend signing up for the patient portal called "MyChart".  Sign up  information is provided on this After Visit Summary.  MyChart is used to connect with patients for Virtual Visits (Telemedicine).  Patients are able to view lab/test results, encounter notes, upcoming appointments, etc.  Non-urgent messages can be sent to your provider as well.   To learn more about what you can do with MyChart, go to [email or web address].    Your next appointment:   3 week(s)  Provider:   You may see Lorine Bears, MD or one of the following Advanced Practice Providers on your designated Care Team:   Nicolasa Ducking, NP Eula Listen, PA-C Cadence Fransico Michael, PA-C Charlsie Quest, NP Carlos Levering, NP    Other Instructions  [facility] A DEPT OF [place]. [facility] AT [facility] [address] [place] [address] Dept: [phone] Loc: [phone]  [name]  12/07/2023  You are scheduled for a Cardiac Catheterization on Thursday, December 19 with Dr. Lorine Bears.  1. Please arrive at the [facility] Center Entrance of [facility], [address], [place] [address] at ? (This is 1 hour(s) prior to your procedure time).  Proceed to the Check-In Desk directly inside the entrance.  Procedure Parking: Use the entrance off of the [address] side of the hospital. Turn right upon entering and follow the driveway to parking that is directly in front of the Heart & Vascular Center. There is no valet parking available at this entrance, however there is an awning directly in front of the Heart & Vascular Center for drop off/  pick up for patients.  Special note: Every effort is made to have your procedure done on time. Please understand that emergencies sometimes delay scheduled procedures.  2. Diet: Do not eat solid foods after midnight.  The patient may have clear liquids until 5am upon the day of the procedure.  3. Labs: You will need to have blood drawn on 12/07/23. You do not need to be  fasting.  4. Medication instructions in preparation for your procedure: Hold all diabetic medication the morning of the procedure  -take half the dose of the Lantus the night before the cath  On the morning of your procedure, take your Aspirin 81 mg and any morning medicines NOT listed above.  You may use sips of water.  5. Plan to go home the same day, you will only stay overnight if medically necessary. 6. Bring a current list of your medications and current insurance cards. 7. You MUST have a responsible person to drive you home. 8. Someone MUST be with you the first 24 hours after you arrive home or your discharge will be delayed. 9. Please wear clothes that are easy to get on and off and wear slip-on shoes.  Thank you for allowing [place] to care for you!   -- [place] [facility]

## 2023-12-07 NOTE — H&P (View-Only) (Signed)
Cardiology Office Note   Date:  12/07/2023   ID:  Bellamae, Bignell [birth year], MRN [id number]  PCP:  Marjie Skiff, NP  Cardiologist:   Lorine Bears, MD   Chief Complaint  Patient presents with   Follow-up    Patient repots difficulty breathing while lying down at night.  The breathing does not improve if she sits up in chair.  No episodes during the day.      History of Present Illness: ADALAYA BYRNES is a 57 y.o. female who presents for for urgent evaluation regarding chest pain and shortness of breath. She has known history of end-stage renal disease on hemodialysis, COPD, prior tobacco use, type 2 diabetes, essential hypertension, hepatitis C, hyperlipidemia, peripheral arterial disease, anxiety and depression.  She has been seen since 2023 for resistant hypertension and intermittent chest pain.  She had a Lexiscan Myoview done in May 2023 which showed no significant perfusion defects.  CT attenuation images showed to heavy three-vessel coronary artery calcifications. She did have palpitations done and underwent outpatient monitor in July which showed mostly sinus rhythm with 1 run of supraventricular tachycardia lasting 15 beats.  Multiple triggered events did not correlate with arrhythmia. Echocardiogram done in October of this year showed normal LV systolic function, moderate mitral regurgitation and mild aortic insufficiency. She undergoes dialysis on Monday Wednesday and Friday.  Over the last 3 weeks, she experienced consistent symptoms of orthopnea, PND with associated substernal chest tightness.  The symptoms wake her up from sleep and then she has to sit in a recliner all night long to be able to sleep.  She has been undergoing dialysis on a regular basis and has been around her dry weight.    Past Medical History:  Diagnosis Date   Allergy    Anemia    Anxiety    worse when not at home - bowel incontinence   Arthritis    Ataxia    Chest pain    a. 10/2016  MV: EF 57%, no ischemia/infarct; b. 04/2022 MV: EF 43%, no ischemia/infarct. Heavy 3 vessel cor Ca2+ and mod aortic atherosclerosis.   COPD (chronic obstructive pulmonary disease) (HCC)    Depression    Diabetes mellitus with complication (HCC)    Diastolic dysfunction    a. 06/2016 Echo: EF 55-60%; b. 05/2017 Echo: EF 50-55%; c. 05/2018 Echo: EF 55-60%, no rwma, GrI DD, mild AI, mild-mod MR, mod dil LA. PASP ; d. 05/2022 Echo: EF 60-65%, no rwma, mild LVH, GrI DD, nl RV fxn, mild-mod dil LA, mild-mod MR, mild AI.   ESRD on hemodialysis [facility])    MWF dialysis   Fistula    left upper arm   Hepatitis 2003   Hep C   Hiatal hernia    Hypercholesterolemia    Hypertension    Migraine    Mitral regurgitation    a. 05/2022 Echo: mild-mod MR.   Neuropathy, diabetic (HCC)    lower legs   Non-alcoholic cirrhosis (HCC)    Palpitations    a. 06/2018 Zio: RSR, 74, rare PACs and PVCs.  No significant arrhythmia.   Peripheral vascular disease (HCC)    Pneumonia 2015   Psoriasis    Tobacco dependence    Wears dentures    full upper, partial lower    Past Surgical History:  Procedure Laterality Date   A/V FISTULAGRAM Left 04/23/2020   Procedure: A/V FISTULAGRAM;  Surgeon: Renford Dills, MD;  Location: [facility];  Service: Cardiovascular;  Laterality: Left;   A/V SHUNT INTERVENTION N/A 12/16/2017   Procedure: A/V SHUNT INTERVENTION;  Surgeon: Annice Needy, MD;  Location: [facility];  Service: Cardiovascular;  Laterality: N/A;   AQUEOUS SHUNT Left 12/20/2019   Procedure: AHMED TUBE SHUNT WITH TUTOPLAST AND AC WASHOUT LEFT DIABETIC;  Surgeon: Lockie Mola, MD;  Location: [facility];  Service: Ophthalmology;  Laterality: Left;  Diabetic - insulin   AV FISTULA PLACEMENT Left 11/2014   CESAREAN SECTION     CHOLECYSTECTOMY     COLONOSCOPY N/A 09/22/2017   Procedure: COLONOSCOPY;  Surgeon: Toney Reil, MD;  Location: [facility];  Service:  Endoscopy;  Laterality: N/A;   COLONOSCOPY WITH PROPOFOL N/A 04/16/2020   Procedure: COLONOSCOPY WITH PROPOFOL;  Surgeon: Midge Minium, MD;  Location: [facility] ENDOSCOPY;  Service: Endoscopy;  Laterality: N/A;  Priority 4   cyst removed  from left hand Left 1989   DIALYSIS/PERMA CATHETER INSERTION N/A 05/20/2017   Procedure: Dialysis/Perma Catheter Insertion and fistulagram/LUE angiogram;  Surgeon: Annice Needy, MD;  Location: [facility];  Service: Cardiovascular;  Laterality: N/A;   ESOPHAGOGASTRODUODENOSCOPY N/A 09/22/2017   Procedure: ESOPHAGOGASTRODUODENOSCOPY (EGD);  Surgeon: Toney Reil, MD;  Location: [facility];  Service: Endoscopy;  Laterality: N/A;   ESOPHAGOGASTRODUODENOSCOPY (EGD) WITH PROPOFOL N/A 12/04/2021   Procedure: ESOPHAGOGASTRODUODENOSCOPY (EGD) WITH PROPOFOL;  Surgeon: Midge Minium, MD;  Location: [facility] ENDOSCOPY;  Service: Endoscopy;  Laterality: N/A;   EYE SURGERY Right 09/02/2022   INCISION AND DRAINAGE ABSCESS N/A 07/16/2016   Procedure: INCISION AND DRAINAGE ABSCESS;  Surgeon: Bud Face, MD;  Location: [facility] OR[facility];  Service: ENT;  Laterality: N/A;   PERIPHERAL VASCULAR CATHETERIZATION N/A 07/25/2015   Procedure: A/V Shuntogram/Fistulagram;  Surgeon: Annice Needy, MD;  Location: [facility];  Service: Cardiovascular;  Laterality: N/A;   PERIPHERAL VASCULAR CATHETERIZATION Left 07/25/2015   Procedure: A/V Shunt Intervention;  Surgeon: Annice Needy, MD;  Location: [facility];  Service: Cardiovascular;  Laterality: Left;   PERIPHERAL VASCULAR CATHETERIZATION Left 10/07/2015   Procedure: A/V Shuntogram/Fistulagram;  Surgeon: Annice Needy, MD;  Location: [facility];  Service: Cardiovascular;  Laterality: Left;   PERIPHERAL VASCULAR CATHETERIZATION N/A 10/07/2015   Procedure: A/V Shunt Intervention;  Surgeon: Annice Needy, MD;  Location: [facility];  Service: Cardiovascular;  Laterality: N/A;   PERIPHERAL VASCULAR  CATHETERIZATION  10/07/2015   Procedure: Dialysis/Perma Catheter Insertion;  Surgeon: Annice Needy, MD;  Location: [facility];  Service: Cardiovascular;;   PERIPHERAL VASCULAR CATHETERIZATION N/A 12/17/2015   Procedure: Dialysis/Perma Catheter Removal;  Surgeon: Renford Dills, MD;  Location: [facility];  Service: Cardiovascular;  Laterality: N/A;   PERIPHERAL VASCULAR CATHETERIZATION N/A 01/11/2017   Procedure: Visceral Angiography;  Surgeon: Annice Needy, MD;  Location: [facility];  Service: Cardiovascular;  Laterality: N/A;   PERIPHERAL VASCULAR CATHETERIZATION N/A 01/11/2017   Procedure: Visceral Artery Intervention;  Surgeon: Annice Needy, MD;  Location: [facility];  Service: Cardiovascular;  Laterality: N/A;   POLYPECTOMY  09/22/2017   Procedure: POLYPECTOMY;  Surgeon: Toney Reil, MD;  Location: [facility];  Service: Endoscopy;;   rt. tubal and ovary removed     STENT PLACEMENT VASCULAR ([facility]) Left 03/2018   Performed at [facility] using EV3 protg GPS stent graph [id number] lot [id number]   TONSILLECTOMY     UPPER EXTREMITY ANGIOGRAPHY Left 09/29/2019  Procedure: UPPER EXTREMITY ANGIOGRAPHY;  Surgeon: Renford Dills, MD;  Location: [facility];  Service: Cardiovascular;  Laterality: Left;     Current Outpatient Medications  Medication Sig Dispense Refill   Banana Flakes (BANATROL PLUS PO) Take by mouth. OTC to help with IBS     BD INSULIN SYRINGE U/F 30G X 1/2" 0.5 ML MISC USE 4 TIMES DAILY WITH LANTUS & NOVOLOG 100 each 3   brimonidine (ALPHAGAN) 0.2 % ophthalmic solution Place 1 drop into the left eye in the morning and at bedtime.      calcium carbonate (TUMS - DOSED IN MG ELEMENTAL CALCIUM) 500 MG chewable tablet Chew 1 tablet by mouth daily.     ciclopirox (PENLAC) 8 % solution Apply topically at bedtime. Apply over nail and surrounding skin. Apply daily over previous coat. After seven  (7) days, may remove with alcohol and continue cycle. 6.6 mL 4   diltiazem (CARDIZEM) 90 MG tablet Take 90 mg by mouth 4 (four) times daily.     dorzolamidel-timolol (COSOPT) 22.3-6.8 MG/ML SOLN ophthalmic solution Place 1 drop into the left eye 2 (two) times daily.      insulin aspart (NOVOLOG) 100 UNIT/ML injection INJECT 5 TO 10 UNITS INTO THE SKIN 3 TIMES A DAY BEFORE MEALS 30 mL 1   insulin glargine (LANTUS) 100 UNIT/ML injection INJECT 15 UNITS INTO THE SKIN DAILY. 30 mL 1   leflunomide (ARAVA) 10 MG tablet Take 1 tablet (10 mg total) by mouth daily. 90 tablet 4   lidocaine-prilocaine (EMLA) cream Apply 1 Application topically daily.     midodrine (PROAMATINE) 10 MG tablet TAKE 0.5-1 TABLETS (5-10 MG TOTAL) BY MOUTH DAILY AS NEEDED. 90 tablet 1   omeprazole (PRILOSEC) 40 MG capsule TAKE 1 CAPSULE BY MOUTH EVERY DAY 90 capsule 1   [name] test strip USE TO CHECK BLOOD SUGAR 3 TIMES DAILY, FASTING IN MORNING WITH GOAL <130 AND 2 HOURS AFTER MEALS WITH GOAL <180. BRING BLOOD SUGAR LOG TO VISITS. 100 strip 11   oxyCODONE (OXY IR/ROXICODONE) 5 MG immediate release tablet Take 1 tablet (5 mg total) by mouth every 8 (eight) hours as needed for severe pain. Must last 30 days. 75 tablet 0   promethazine (PHENERGAN) 25 MG tablet Take 1 tablet (25 mg total) by mouth 2 (two) times daily as needed. 90 tablet 4   Vitamin D, Ergocalciferol, (DRISDOL) 1.25 MG (50000 UNIT) CAPS capsule Take 50,000 Units by mouth 3 (three) times a week.     Incontinence Supply Disposable (FQ PROTECTIVE UNDERWEAR) MISC 1 CSE PU LARGE PLUS USING DAILY AS NEEDED ADD WIPES (Patient not taking: Reported on 12/07/2023) 72 each 11   lidocaine (LIDODERM) 5 % Place 1 patch onto the skin daily. Remove & Discard patch within 12 hours or as directed by MD (Patient not taking: Reported on 12/07/2023) 30 patch 0   oxyCODONE (OXY IR/ROXICODONE) 5 MG immediate release tablet Take 1 tablet (5 mg total) by mouth every 8 (eight) hours as  needed for severe pain. Must last 30 days. 75 tablet 0   oxyCODONE (OXY IR/ROXICODONE) 5 MG immediate release tablet Take 1 tablet (5 mg total) by mouth every 8 (eight) hours as needed for severe pain. Must last 30 days. 75 tablet 0   No current facility-administered medications for this visit.    Allergies:   Cinnamon, Garlic, Onion, Tylenol [acetaminophen], Losartan potassium, Amlodipine, Ciprofloxacin, Clonidine derivatives, Ginger, Hydralazine, Lisinopril, Metoprolol, Prednisone, and Statins    Social History:  The patient  reports that she quit smoking about 2 years ago. Her smoking use included cigarettes. She started smoking about 22 years ago. She has a 5 pack-year smoking history. She has never used smokeless tobacco. She reports that she does not drink alcohol and does not use drugs.   Family History:  The patient's family history includes Heart disease in her mother; Hypertension in her father and mother; Skin cancer in her father.    ROS:  Please see the history of present illness.   Otherwise, review of systems are positive for none.   All other systems are reviewed and negative.    PHYSICAL EXAM: VS:  BP (!) 140/58 (BP Location: Left Arm, Patient Position: Sitting, Cuff Size: Large)   Pulse 68   Ht 5\' 5"  (1.651 m)   Wt 212 lb 3.2 oz (96.3 kg)   SpO2 98%   BMI 35.31 kg/m  , BMI Body mass index is 35.31 kg/m. GEN: Well nourished, well developed, in no acute distress  HEENT: normal  Neck: no JVD, carotid bruits, or masses Cardiac: RRR; no murmurs, rubs, or gallops,no edema  Respiratory:  clear to auscultation bilaterally, normal work of breathing GI: soft, nontender, nondistended, + BS MS: no deformity or atrophy  Skin: warm and dry, no rash Neuro:  Strength and sensation are intact Psych: euthymic mood, full affect Dialysis vascular access is in the left arm.  Right radial pulse was not palpable.   EKG:  EKG is ordered today. The ekg ordered today demonstrates  : Normal sinus rhythm Moderate voltage criteria for LVH, may be normal variant ( R in aVL , Cornell product ) T wave abnormality, consider lateral ischemia    Recent Labs: 04/13/2023: Hemoglobin 11.1; Platelets 155 11/12/2023: ALT 16; BUN 28; Creatinine, Ser 3.65; Magnesium 1.7; Potassium 4.0; Sodium 139; TSH 0.905    Lipid Panel    Component Value Date/Time   CHOL 175 11/12/2023 1347   CHOL 157 10/15/2014 0402   TRIG 158 (H) 11/12/2023 1347   TRIG 83 10/15/2014 0402   HDL 32 (L) 11/12/2023 1347   HDL 49 10/15/2014 0402   VLDL 17 10/15/2014 0402   LDLCALC 115 (H) 11/12/2023 1347   LDLCALC 91 10/15/2014 0402      Wt Readings from Last 3 Encounters:  12/07/23 212 lb 3.2 oz (96.3 kg)  11/12/23 208 lb 9.6 oz (94.6 kg)  10/07/23 207 lb 6.4 oz (94.1 kg)          05/31/2018    1:57 PM  PAD Screen  Previous surgical procedure? Yes  Dates of procedures Performed at [facility] using EV3 protg GPS stent graph SERB65-10-80-80 lot [id number]  Pain with walking? No  Feet/toe relief with dangling? No  Painful, non-healing ulcers? Yes  Extremities discolored? No      ASSESSMENT AND PLAN:  1.  Coronary artery disease involving native coronary arteries with unstable angina: The patient reports recent symptoms of nocturnal angina over the last few weeks.  Previous CT imaging showed heavy three-vessel coronary artery calcifications.  She has been diabetic for a long time and has been on dialysis for 9 years.  She is at high risk for left main and three-vessel coronary artery disease. In addition, she is having symptoms of heart failure manifested by orthopnea and PND and palliative undergoing regular dialysis and being around her dry weight. Due to all of that, I recommend proceeding with right and left cardiac catheterization and possible PCI.  I discussed  the procedure in details as well as risks and benefits. Planned access is via the right femoral artery and  vein. Asked her to start aspirin 81 mg daily.  2.  Hyperlipidemia: Intolerance to statins due to myalgia.  Most recent lipid profile showed an LDL of 115.  Will have to consider treatment with a PCSK9 inhibitor.  3.  End-stage renal disease on hemodialysis: She undergoes dialysis 3 times per week.  4.  moderate mitral regurgitation: This will have to be evaluated with a right heart catheterization.  5.  Orthostatic hypotension: Symptoms are stable.    Disposition:   Proceed with urgent right and left cardiac catheterization and follow-up after.  Signed,  Lorine Bears, MD  12/07/2023 4:33 PM    [facility]

## 2023-12-08 ENCOUNTER — Other Ambulatory Visit
Admission: RE | Admit: 2023-12-08 | Discharge: 2023-12-08 | Disposition: A | Discharge status: Home / Self Care | Payer: 59 | Attending: Cardiovascular Disease | Admitting: Cardiovascular Disease

## 2023-12-08 DIAGNOSIS — I200 Unstable angina: Secondary | ICD-10-CM | POA: Insufficient documentation

## 2023-12-08 DIAGNOSIS — I10 Essential (primary) hypertension: Secondary | ICD-10-CM | POA: Diagnosis present

## 2023-12-08 LAB — CBC
HCT: 36.5 % (ref 36.0–46.0)
Hemoglobin: 11.5 g/dL — ABNORMAL LOW (ref 12.0–15.0)
MCH: 30.6 pg (ref 26.0–34.0)
MCHC: 31.5 g/dL (ref 30.0–36.0)
MCV: 97.1 fL (ref 80.0–100.0)
Platelets: 147 10*3/uL — ABNORMAL LOW (ref 150–400)
RBC: 3.76 MIL/uL — ABNORMAL LOW (ref 3.87–5.11)
RDW: 13.2 % (ref 11.5–15.5)
WBC: 5.1 10*3/uL (ref 4.0–10.5)
nRBC: 0 % (ref 0.0–0.2)

## 2023-12-08 LAB — BASIC METABOLIC PANEL
Anion gap: 13 (ref 5–15)
BUN: 24 mg/dL — ABNORMAL HIGH (ref 6–20)
CO2: 27 mmol/L (ref 22–32)
Calcium: 9 mg/dL (ref 8.9–10.3)
Chloride: 97 mmol/L — ABNORMAL LOW (ref 98–111)
Creatinine, Ser: 3.55 mg/dL — ABNORMAL HIGH (ref 0.44–1.00)
GFR, Estimated: 14 mL/min — ABNORMAL LOW (ref >60)
Glucose, Bld: 163 mg/dL — ABNORMAL HIGH (ref 70–99)
Potassium: 4.1 mmol/L (ref 3.5–5.1)
Sodium: 137 mmol/L (ref 135–145)

## 2023-12-09 ENCOUNTER — Encounter: Payer: Self-pay | Admitting: Nurse Practitioner

## 2023-12-09 ENCOUNTER — Ambulatory Visit
Admission: RE | Admit: 2023-12-09 | Discharge: 2023-12-09 | Disposition: A | Discharge status: Home / Self Care | Payer: 59 | Attending: Cardiovascular Disease | Admitting: Cardiovascular Disease

## 2023-12-09 ENCOUNTER — Other Ambulatory Visit: Payer: Self-pay

## 2023-12-09 ENCOUNTER — Encounter: Payer: Self-pay | Admitting: Cardiovascular Disease

## 2023-12-09 ENCOUNTER — Encounter
Admission: RE | Disposition: A | Discharge status: Home / Self Care | Payer: Self-pay | Source: Home / Self Care | Attending: Cardiovascular Disease

## 2023-12-09 DIAGNOSIS — F32A Depression, unspecified: Secondary | ICD-10-CM | POA: Insufficient documentation

## 2023-12-09 DIAGNOSIS — B1920 Unspecified viral hepatitis C without hepatic coma: Secondary | ICD-10-CM | POA: Diagnosis not present

## 2023-12-09 DIAGNOSIS — F419 Anxiety disorder, unspecified: Secondary | ICD-10-CM | POA: Diagnosis not present

## 2023-12-09 DIAGNOSIS — I132 Hypertensive heart and chronic kidney disease with heart failure and with stage 5 chronic kidney disease, or end stage renal disease: Secondary | ICD-10-CM | POA: Diagnosis not present

## 2023-12-09 DIAGNOSIS — I340 Nonrheumatic mitral (valve) insufficiency: Secondary | ICD-10-CM | POA: Insufficient documentation

## 2023-12-09 DIAGNOSIS — I2584 Coronary atherosclerosis due to calcified coronary lesion: Secondary | ICD-10-CM | POA: Diagnosis not present

## 2023-12-09 DIAGNOSIS — E785 Hyperlipidemia, unspecified: Secondary | ICD-10-CM | POA: Insufficient documentation

## 2023-12-09 DIAGNOSIS — Z794 Long term (current) use of insulin: Secondary | ICD-10-CM | POA: Diagnosis not present

## 2023-12-09 DIAGNOSIS — J449 Chronic obstructive pulmonary disease, unspecified: Secondary | ICD-10-CM | POA: Insufficient documentation

## 2023-12-09 DIAGNOSIS — I200 Unstable angina: Secondary | ICD-10-CM

## 2023-12-09 DIAGNOSIS — I5031 Acute diastolic (congestive) heart failure: Secondary | ICD-10-CM | POA: Diagnosis not present

## 2023-12-09 DIAGNOSIS — Z992 Dependence on renal dialysis: Secondary | ICD-10-CM | POA: Diagnosis not present

## 2023-12-09 DIAGNOSIS — Z79899 Other long term (current) drug therapy: Secondary | ICD-10-CM | POA: Diagnosis not present

## 2023-12-09 DIAGNOSIS — E1122 Type 2 diabetes mellitus with diabetic chronic kidney disease: Secondary | ICD-10-CM | POA: Diagnosis not present

## 2023-12-09 DIAGNOSIS — E1151 Type 2 diabetes mellitus with diabetic peripheral angiopathy without gangrene: Secondary | ICD-10-CM | POA: Diagnosis not present

## 2023-12-09 DIAGNOSIS — I25110 Atherosclerotic heart disease of native coronary artery with unstable angina pectoris: Secondary | ICD-10-CM | POA: Diagnosis present

## 2023-12-09 DIAGNOSIS — Z87891 Personal history of nicotine dependence: Secondary | ICD-10-CM | POA: Diagnosis not present

## 2023-12-09 DIAGNOSIS — N186 End stage renal disease: Secondary | ICD-10-CM | POA: Diagnosis not present

## 2023-12-09 DIAGNOSIS — I951 Orthostatic hypotension: Secondary | ICD-10-CM | POA: Insufficient documentation

## 2023-12-09 DIAGNOSIS — Z7982 Long term (current) use of aspirin: Secondary | ICD-10-CM | POA: Diagnosis not present

## 2023-12-09 DIAGNOSIS — I272 Pulmonary hypertension, unspecified: Secondary | ICD-10-CM | POA: Insufficient documentation

## 2023-12-09 HISTORY — PX: RIGHT/LEFT HEART CATH AND CORONARY ANGIOGRAPHY: CATH118266

## 2023-12-09 LAB — POCT I-STAT EG7
Acid-Base Excess: 2 mmol/L (ref 0.0–2.0)
Bicarbonate: 27.8 mmol/L (ref 20.0–28.0)
Calcium, Ion: 1.1 mmol/L — ABNORMAL LOW (ref 1.15–1.40)
HCT: 32 % — ABNORMAL LOW (ref 36.0–46.0)
Hemoglobin: 10.9 g/dL — ABNORMAL LOW (ref 12.0–15.0)
O2 Saturation: 57 %
Potassium: 4.9 mmol/L (ref 3.5–5.1)
Sodium: 137 mmol/L (ref 135–145)
TCO2: 29 mmol/L (ref 22–32)
pCO2, Ven: 49.5 mm[Hg] (ref 44–60)
pH, Ven: 7.357 (ref 7.25–7.43)
pO2, Ven: 31 mm[Hg] — CL (ref 32–45)

## 2023-12-09 LAB — POCT I-STAT 7, (LYTES, BLD GAS, ICA,H+H)
Acid-Base Excess: 0 mmol/L (ref 0.0–2.0)
Bicarbonate: 24.7 mmol/L (ref 20.0–28.0)
Calcium, Ion: 1.06 mmol/L — ABNORMAL LOW (ref 1.15–1.40)
HCT: 32 % — ABNORMAL LOW (ref 36.0–46.0)
Hemoglobin: 10.9 g/dL — ABNORMAL LOW (ref 12.0–15.0)
O2 Saturation: 94 %
Potassium: 4.8 mmol/L (ref 3.5–5.1)
Sodium: 137 mmol/L (ref 135–145)
TCO2: 26 mmol/L (ref 22–32)
pCO2 arterial: 38.7 mm[Hg] (ref 32–48)
pH, Arterial: 7.412 (ref 7.35–7.45)
pO2, Arterial: 69 mm[Hg] — ABNORMAL LOW (ref 83–108)

## 2023-12-09 LAB — GLUCOSE, CAPILLARY: Glucose-Capillary: 229 mg/dL — ABNORMAL HIGH (ref 70–99)

## 2023-12-09 SURGERY — RIGHT/LEFT HEART CATH AND CORONARY ANGIOGRAPHY
Anesthesia: Moderate Sedation | Laterality: Bilateral

## 2023-12-09 MED ORDER — SODIUM CHLORIDE 0.9 % IV SOLN: INTRAVENOUS | Status: DC

## 2023-12-09 MED ORDER — FENTANYL CITRATE (PF) 100 MCG/2ML IJ SOLN
INTRAMUSCULAR | Status: DC | PRN
  Administered 2023-12-09: 25 ug via INTRAVENOUS

## 2023-12-09 MED ORDER — SODIUM CHLORIDE 0.9% FLUSH: 3.0000 mL | INTRAVENOUS | Status: DC | PRN

## 2023-12-09 MED ORDER — MIDAZOLAM HCL 2 MG/2ML IJ SOLN
INTRAMUSCULAR | Status: AC
Start: 2023-12-09 — End: ?
  Filled 2023-12-09: qty 2

## 2023-12-09 MED ORDER — HEPARIN (PORCINE) IN NACL 1000-0.9 UT/500ML-% IV SOLN
INTRAVENOUS | Status: AC
  Filled 2023-12-09: qty 1000

## 2023-12-09 MED ORDER — SODIUM CHLORIDE 0.9 % IV SOLN: 250.0000 mL | INTRAVENOUS | Status: DC | PRN

## 2023-12-09 MED ORDER — HEPARIN (PORCINE) IN NACL 1000-0.9 UT/500ML-% IV SOLN
INTRAVENOUS | Status: DC | PRN
  Administered 2023-12-09: 1000 mL

## 2023-12-09 MED ORDER — IOHEXOL 300 MG/ML  SOLN
INTRAMUSCULAR | Status: DC | PRN
  Administered 2023-12-09: 62 mL

## 2023-12-09 MED ORDER — FENTANYL CITRATE (PF) 100 MCG/2ML IJ SOLN
INTRAMUSCULAR | Status: AC
  Filled 2023-12-09: qty 2

## 2023-12-09 MED ORDER — ASPIRIN 81 MG PO CHEW: 81.0000 mg | CHEWABLE_TABLET | ORAL | Status: DC

## 2023-12-09 MED ORDER — MIDAZOLAM HCL 2 MG/2ML IJ SOLN
INTRAMUSCULAR | Status: DC | PRN
  Administered 2023-12-09 (×2): 1 mg via INTRAVENOUS

## 2023-12-09 MED ORDER — SODIUM CHLORIDE 0.9% FLUSH: 3.0000 mL | Freq: Two times a day (BID) | INTRAVENOUS | Status: DC

## 2023-12-09 SURGICAL SUPPLY — 13 items
CATH INFINITI 5FR MULTPACK ANG (CATHETERS) IMPLANT
CATH SWAN GANZ 7F STRAIGHT (CATHETERS) IMPLANT
DEVICE CLOSURE MYNXGRIP 5F (Vascular Products) IMPLANT
DRAPE BRACHIAL (DRAPES) IMPLANT
KIT MICROPUNCTURE NIT STIFF (SHEATH) IMPLANT
KIT SYRINGE INJ CVI SPIKEX1 (MISCELLANEOUS) IMPLANT
PACK CARDIAC CATH (CUSTOM PROCEDURE TRAY) ×1 IMPLANT
PROTECTION STATION PRESSURIZED (MISCELLANEOUS) ×1
SET ATX-X65L (MISCELLANEOUS) IMPLANT
SHEATH AVANTI 5FR X 11CM (SHEATH) IMPLANT
SHEATH AVANTI 7FRX11 (SHEATH) IMPLANT
STATION PROTECTION PRESSURIZED (MISCELLANEOUS) IMPLANT
WIRE GUIDERIGHT .035X150 (WIRE) IMPLANT

## 2023-12-09 NOTE — Discharge Instructions (Signed)

## 2023-12-09 NOTE — Interval H&P Note (Signed)
History and Physical Interval Note:  12/09/2023 8:49 AM  [name]  has presented today for surgery, with the diagnosis of R and L Cath    Unstable angina.  The various methods of treatment have been discussed with the patient and family. After consideration of risks, benefits and other options for treatment, the patient has consented to  Procedure(s): RIGHT/LEFT HEART CATH AND CORONARY ANGIOGRAPHY (Bilateral) as a surgical intervention.  The patient's history has been reviewed, patient examined, no change in status, stable for surgery.  I have reviewed the patient's chart and labs.  Questions were answered to the patient's satisfaction.     Lorine Bears

## 2023-12-10 ENCOUNTER — Encounter: Payer: Self-pay | Admitting: Cardiovascular Disease

## 2023-12-20 ENCOUNTER — Ambulatory Visit: Payer: 59 | Admitting: Student

## 2023-12-21 ENCOUNTER — Encounter: Payer: 59 | Admitting: Student in an Organized Health Care Education/Training Program

## 2023-12-23 ENCOUNTER — Encounter: Payer: 59 | Admitting: Student in an Organized Health Care Education/Training Program

## 2023-12-24 ENCOUNTER — Institutional Professional Consult (permissible substitution) (INDEPENDENT_AMBULATORY_CARE_PROVIDER_SITE_OTHER): Payer: 59 | Admitting: Thoracic Surgery (Cardiothoracic Vascular Surgery)

## 2023-12-24 VITALS — BP 145/80 | HR 84 | Resp 18 | Ht 65.0 in | Wt 207.0 lb

## 2023-12-24 DIAGNOSIS — I2510 Atherosclerotic heart disease of native coronary artery without angina pectoris: Secondary | ICD-10-CM

## 2023-12-24 NOTE — Progress Notes (Signed)
 [address]       [place] [address]             [phone]        [name] [facility] Medical Record #[id number] Date of Birth: Oct 28, 1966  Referring: Darron Deatrice LABOR, MD Primary Care: Valerio Melanie DASEN, NP Primary Cardiologist:Muhammad Darron, MD  Chief Complaint:    Chief Complaint  Patient presents with   Coronary Artery Disease    Cath 12/19, eCHO 10/24    History of Present Illness:     [name] 58 y.o. female presents for surgical evaluation of three-vessel coronary artery disease and mild to moderate mitral valve regurgitation.  She has a history of end-stage renal disease and has been on dialysis for the last 9 years.  She states that she has been having chest tightness symptoms for several years as well as labile blood pressures during dialysis.  She recently underwent a left heart catheterization which identified the disease.   Past Medical and Surgical History: Previous Chest Surgery: No Previous Chest Radiation: No Diabetes Mellitus: Yes.  HbA1C 6.5 Creatinine:  Lab Results  Component Value Date   CREATININE 3.55 (H) 12/08/2023   CREATININE 3.65 (H) 11/12/2023   CREATININE 6.96 (H) 04/13/2023     Past Medical History:  Diagnosis Date   Allergy    Anemia    Anxiety    worse when not at home - bowel incontinence   Arthritis    Ataxia    Chest pain    a. 10/2016 MV: EF 57%, no ischemia/infarct; b. 04/2022 MV: EF 43%, no ischemia/infarct. Heavy 3 vessel cor Ca2+ and mod aortic atherosclerosis.   COPD (chronic obstructive pulmonary disease) (HCC)    Depression    Diabetes mellitus with complication (HCC)    Diastolic dysfunction    a. 06/2016 Echo: EF 55-60%; b. 05/2017 Echo: EF 50-55%; c. 05/2018 Echo: EF 55-60%, no rwma, GrI DD, mild AI, mild-mod MR, mod dil LA. PASP ; d. 05/2022 Echo: EF 60-65%, no rwma, mild LVH, GrI DD, nl RV fxn, mild-mod dil LA, mild-mod MR, mild AI.   ESRD on hemodialysis [facility])    MWF dialysis    Fistula    left upper arm   Hepatitis 2003   Hep C   Hiatal hernia    Hypercholesterolemia    Hypertension    Migraine    Mitral regurgitation    a. 05/2022 Echo: mild-mod MR.   Neuropathy, diabetic (HCC)    lower legs   Non-alcoholic cirrhosis (HCC)    Palpitations    a. 06/2018 Zio: RSR, 74, rare PACs and PVCs.  No significant arrhythmia.   Peripheral vascular disease (HCC)    Pneumonia 2015   Psoriasis    Tobacco dependence    Wears dentures    full upper, partial lower    Past Surgical History:  Procedure Laterality Date   A/V FISTULAGRAM Left 04/23/2020   Procedure: A/V FISTULAGRAM;  Surgeon: Jama Cordella MATSU, MD;  Location: [facility];  Service: Cardiovascular;  Laterality: Left;   A/V SHUNT INTERVENTION N/A 12/16/2017   Procedure: A/V SHUNT INTERVENTION;  Surgeon: Marea Selinda RAMAN, MD;  Location: [facility];  Service: Cardiovascular;  Laterality: N/A;   AQUEOUS SHUNT Left 12/20/2019   Procedure: AHMED TUBE SHUNT WITH TUTOPLAST AND AC WASHOUT LEFT DIABETIC;  Surgeon: Mittie Gaskin, MD;  Location: [facility];  Service: Ophthalmology;  Laterality: Left;  Diabetic -  insulin    AV FISTULA PLACEMENT Left 11/2014   CESAREAN SECTION     CHOLECYSTECTOMY     COLONOSCOPY N/A 09/22/2017   Procedure: COLONOSCOPY;  Surgeon: Unk Corinn Skiff, MD;  Location: [facility];  Service: Endoscopy;  Laterality: N/A;   COLONOSCOPY WITH PROPOFOL  N/A 04/16/2020   Procedure: COLONOSCOPY WITH PROPOFOL ;  Surgeon: Jinny Carmine, MD;  Location: [facility];  Service: Endoscopy;  Laterality: N/A;  Priority 4   cyst removed  from left hand Left 1989   DIALYSIS/PERMA CATHETER INSERTION N/A 05/20/2017   Procedure: Dialysis/Perma Catheter Insertion and fistulagram/LUE angiogram;  Surgeon: Marea Selinda RAMAN, MD;  Location: [facility];  Service: Cardiovascular;  Laterality: N/A;   ESOPHAGOGASTRODUODENOSCOPY N/A 09/22/2017   Procedure:  ESOPHAGOGASTRODUODENOSCOPY (EGD);  Surgeon: Unk Corinn Skiff, MD;  Location: [facility];  Service: Endoscopy;  Laterality: N/A;   ESOPHAGOGASTRODUODENOSCOPY (EGD) WITH PROPOFOL  N/A 12/04/2021   Procedure: ESOPHAGOGASTRODUODENOSCOPY (EGD) WITH PROPOFOL ;  Surgeon: Jinny Carmine, MD;  Location: [facility];  Service: Endoscopy;  Laterality: N/A;   EYE SURGERY Right 09/02/2022   INCISION AND DRAINAGE ABSCESS N/A 07/16/2016   Procedure: INCISION AND DRAINAGE ABSCESS;  Surgeon: Carolee Hunter, MD;  Location: [facility];  Service: ENT;  Laterality: N/A;   PERIPHERAL VASCULAR CATHETERIZATION N/A 07/25/2015   Procedure: A/V Shuntogram/Fistulagram;  Surgeon: Selinda RAMAN Marea, MD;  Location: [facility];  Service: Cardiovascular;  Laterality: N/A;   PERIPHERAL VASCULAR CATHETERIZATION Left 07/25/2015   Procedure: A/V Shunt Intervention;  Surgeon: Selinda RAMAN Marea, MD;  Location: [facility];  Service: Cardiovascular;  Laterality: Left;   PERIPHERAL VASCULAR CATHETERIZATION Left 10/07/2015   Procedure: A/V Shuntogram/Fistulagram;  Surgeon: Selinda RAMAN Marea, MD;  Location: [facility];  Service: Cardiovascular;  Laterality: Left;   PERIPHERAL VASCULAR CATHETERIZATION N/A 10/07/2015   Procedure: A/V Shunt Intervention;  Surgeon: Selinda RAMAN Marea, MD;  Location: [facility];  Service: Cardiovascular;  Laterality: N/A;   PERIPHERAL VASCULAR CATHETERIZATION  10/07/2015   Procedure: Dialysis/Perma Catheter Insertion;  Surgeon: Selinda RAMAN Marea, MD;  Location: [facility];  Service: Cardiovascular;;   PERIPHERAL VASCULAR CATHETERIZATION N/A 12/17/2015   Procedure: Dialysis/Perma Catheter Removal;  Surgeon: Cordella KANDICE Shawl, MD;  Location: [facility];  Service: Cardiovascular;  Laterality: N/A;   PERIPHERAL VASCULAR CATHETERIZATION N/A 01/11/2017   Procedure: Visceral Angiography;  Surgeon: Selinda RAMAN Marea, MD;  Location: [facility];  Service: Cardiovascular;   Laterality: N/A;   PERIPHERAL VASCULAR CATHETERIZATION N/A 01/11/2017   Procedure: Visceral Artery Intervention;  Surgeon: Selinda RAMAN Marea, MD;  Location: [facility];  Service: Cardiovascular;  Laterality: N/A;   POLYPECTOMY  09/22/2017   Procedure: POLYPECTOMY;  Surgeon: Unk Corinn Skiff, MD;  Location: [facility];  Service: Endoscopy;;   RIGHT/LEFT HEART CATH AND CORONARY ANGIOGRAPHY Bilateral 12/09/2023   Procedure: RIGHT/LEFT HEART CATH AND CORONARY ANGIOGRAPHY;  Surgeon: Darron Deatrice LABOR, MD;  Location: [facility];  Service: Cardiovascular;  Laterality: Bilateral;   rt. tubal and ovary removed     STENT PLACEMENT VASCULAR ([facility]) Left 03/2018   Performed at [facility] using EV3 protg GPS stent graph [id number] lot [id number]   TONSILLECTOMY     UPPER EXTREMITY ANGIOGRAPHY Left 09/29/2019   Procedure: UPPER EXTREMITY ANGIOGRAPHY;  Surgeon: Shawl Cordella KANDICE, MD;  Location: [facility];  Service: Cardiovascular;  Laterality: Left;    Social History:  Social History   Tobacco Use  Smoking Status Former   Current packs/day: 0.00   Average packs/day: 0.3 packs/day for 20.0 years (5.0 ttl pk-yrs)   Types: Cigarettes   Start date: 08/13/2001   Quit date: 08/13/2021   Years since quitting: 2.3  Smokeless Tobacco Never    Social History   Substance and Sexual Activity  Alcohol Use No     Allergies  Allergen Reactions   Cinnamon Anaphylaxis   Garlic Anaphylaxis and Hives   Onion Hives and Swelling   Tylenol  [Acetaminophen ] Anaphylaxis   Losartan Potassium     Other reaction(s): Other (See Comments)   Amlodipine    Ciprofloxacin Diarrhea   Clonidine Derivatives     Dry mouth; somnolence.   Ginger    Hydralazine      Excessive thirst    Lisinopril     Metoprolol      GI upset   Prednisone  Other (See Comments)    Vaginal blisters & oral blisters    Statins     Myalgias       Current Outpatient  Medications  Medication Sig Dispense Refill   aspirin  EC 81 MG tablet Take 1 tablet (81 mg total) by mouth daily. Swallow whole.     BD INSULIN  SYRINGE U/F 30G X 1/2 0.5 ML MISC USE 4 TIMES DAILY WITH LANTUS  & NOVOLOG  100 each 3   brimonidine (ALPHAGAN) 0.2 % ophthalmic solution Place 1 drop into the left eye in the morning and at bedtime.      calcium  carbonate (TUMS - DOSED IN MG ELEMENTAL CALCIUM ) 500 MG chewable tablet Chew by mouth See admin instructions. Take 1 tablet (500 mg) by mouth 3 times daily with meals & take 2 tablets (1000 mg) by mouth twice daily in between meals.     ciclopirox  (PENLAC ) 8 % solution Apply topically at bedtime. Apply over nail and surrounding skin. Apply daily over previous coat. After seven (7) days, may remove with alcohol and continue cycle. 6.6 mL 4   diltiazem  (CARDIZEM ) 30 MG tablet Take 90 mg by mouth 4 (four) times daily.     dorzolamidel-timolol (COSOPT) 22.3-6.8 MG/ML SOLN ophthalmic solution Place 1 drop into the left eye 2 (two) times daily.      ibuprofen  (ADVIL ) 200 MG tablet Take 400 mg by mouth every 8 (eight) hours as needed (pain.).     insulin  aspart (NOVOLOG ) 100 UNIT/ML injection INJECT 5 TO 10 UNITS INTO THE SKIN 3 TIMES A DAY BEFORE MEALS 30 mL 1   insulin  glargine (LANTUS ) 100 UNIT/ML injection INJECT 15 UNITS INTO THE SKIN DAILY. 30 mL 1   leflunomide  (ARAVA ) 10 MG tablet Take 1 tablet (10 mg total) by mouth daily. 90 tablet 4   midodrine  (PROAMATINE ) 10 MG tablet TAKE 0.5-1 TABLETS (5-10 MG TOTAL) BY MOUTH DAILY AS NEEDED. 90 tablet 1   omeprazole  (PRILOSEC) 40 MG capsule TAKE 1 CAPSULE BY MOUTH EVERY DAY 90 capsule 1   [name] test strip USE TO CHECK BLOOD SUGAR 3 TIMES DAILY, FASTING IN MORNING WITH GOAL <130 AND 2 HOURS AFTER MEALS WITH GOAL <180. BRING BLOOD SUGAR LOG TO VISITS. 100 strip 11   oxyCODONE  (OXY IR/ROXICODONE ) 5 MG immediate release tablet Take 1 tablet (5 mg total) by mouth every 8 (eight) hours as needed for severe  pain. Must last 30 days. 75 tablet 0   promethazine  (PHENERGAN ) 25 MG tablet Take 1 tablet (25 mg total) by mouth 2 (two) times daily as needed. 90 tablet 4   Vitamin D , Ergocalciferol , (DRISDOL)  1.25 MG (50000 UNIT) CAPS capsule Take 50,000 Units by mouth every Monday, Wednesday, and Friday.     No current facility-administered medications for this visit.    (Not in a hospital admission)   Family History  Problem Relation Age of Onset   Hypertension Mother    Heart disease Mother        History of heart caths but patient does not think she has required interventions.   Hypertension Father    Skin cancer Father      Review of Systems:   Review of Systems  Constitutional:  Positive for malaise/fatigue and weight loss.  Respiratory:  Positive for shortness of breath.   Cardiovascular:  Positive for chest pain.  Neurological: Negative.       Physical Exam: BP (!) 145/80   Pulse 84   Resp 18   Ht 5' 5 (1.651 m)   Wt 207 lb (93.9 kg)   SpO2 97% Comment: ra  BMI 34.45 kg/m  Physical Exam Constitutional:      General: She is not in acute distress. HENT:     Head: Normocephalic and atraumatic.     Mouth/Throat:     Comments: edentulous Eyes:     Extraocular Movements: Extraocular movements intact.  Cardiovascular:     Rate and Rhythm: Normal rate.  Pulmonary:     Effort: Pulmonary effort is normal. No respiratory distress.  Abdominal:     General: Abdomen is flat.  Musculoskeletal:        General: Normal range of motion.     Cervical back: Normal range of motion.  Skin:    General: Skin is warm and dry.  Neurological:     General: No focal deficit present.     Mental Status: She is alert and oriented to person, place, and time.       Diagnostic Studies & Laboratory data:    Left Heart Catherization:  Intervention Echo:  IMPRESSIONS     1. Left ventricular ejection fraction, by estimation, is 60 to 65%. The  left ventricle has normal function. The  left ventricle demonstrates  regional wall motion abnormalities (basal inferior wall hypokinesis).  There is mild left ventricular hypertrophy.   Left ventricular diastolic parameters are consistent with Grade I  diastolic dysfunction (impaired relaxation). The average left ventricular  global longitudinal strain is -16.7 %.   2. Right ventricular systolic function is normal. The right ventricular  size is normal. Tricuspid regurgitation signal is inadequate for assessing  PA pressure.   3. Left atrial size was severely dilated.   4. The mitral valve is normal in structure. Moderate mitral valve  regurgitation. No evidence of mitral stenosis.   5. The aortic valve is tricuspid. Aortic valve regurgitation is mild.  Aortic valve sclerosis is present, with no evidence of aortic valve  stenosis.   6. The inferior vena cava is normal in size with greater than 50%  respiratory variability, suggesting right atrial pressure of 3 mmHg.    EKG: Sinus I have independently reviewed the above radiologic studies and discussed with the patient   Recent Lab Findings: Lab Results  Component Value Date   WBC 5.1 12/08/2023   HGB 10.9 (L) 12/09/2023   HGB 10.9 (L) 12/09/2023   HCT 32.0 (L) 12/09/2023   HCT 32.0 (L) 12/09/2023   PLT 147 (L) 12/08/2023   GLUCOSE 163 (H) 12/08/2023   CHOL 175 11/12/2023   TRIG 158 (H) 11/12/2023   HDL 32 (L) 11/12/2023  LDLCALC 115 (H) 11/12/2023   ALT 16 11/12/2023   AST 17 11/12/2023   NA 137 12/09/2023   NA 137 12/09/2023   K 4.9 12/09/2023   K 4.8 12/09/2023   CL 97 (L) 12/08/2023   CREATININE 3.55 (H) 12/08/2023   BUN 24 (H) 12/08/2023   CO2 27 12/08/2023   TSH 0.905 11/12/2023   INR 1.2 04/30/2019   HGBA1C 6.5 (H) 11/12/2023      Assessment / Plan:   58 year old female with multiple medical problems including end-stage renal disease currently receiving dialysis on Monday Wednesdays and Fridays.  Diabetes with foot ulcers, and three-vessel  coronary artery disease.  On review of her echocardiogram she has preserved biventricular function and mild to moderate mitral valve regurgitation.  The regurgitant jet is central.  We discussed the risks and benefits of a coronary artery bypass grafting and possible mitral valve repair.  I think that due to her comorbidities she should be admitted preoperatively to ensure an adequate fluid status through dialysis prior to surgery.  She is agreeable with this plan.     I  spent 60 minutes counseling the patient face to face.   [name] 12/24/2023 12:17 PM

## 2023-12-29 NOTE — Progress Notes (Signed)
 Cardiology Office Note:    Date:  12/30/2023   ID:  Inocente JINNY Dolores, DOB 1966-12-09, MRN [id number]  PCP:  Valerio Melanie DASEN, NP  [facility] Cardiologist:  Deatrice Cage, MD  [facility] Electrophysiologist:  None   Referring MD: Valerio Melanie DASEN, NP   Chief Complaint: heart cath follow-up  History of Present Illness:    LYRIQUE HAKIM is a 58 y.o. female with a hx of ESRD on HD, COPD, prior tobacco use, DM2, HTN, migraines, hep C, HLD, PVD, 3V CAD, anxiety, depression who presents for follow-up.    She was preivously evaluated in 2017 for atypical chest pain with echo and stress test. This showed normal LVEF. She was later referred to Dr. Cage in June 2019 with dizziness, presyncope and palpitations. Event monitor showed rare PACs, PVCs with no significant arrythmia. Follow-up echo in June 2019 showed normal LVEF with G1DD. She reestablished care in out office in April 2023 in the setting of difficult to manage HTN. She reports intermittent rest and exertional chest discomfort associated with dyspnea. She was placed on Coreg  and underwent stress testing which showed no ischemia or infarct. CT attenuation imaging showed heavy 3 V coronary calcification and moderate aortic atherosclerosis. EF 45%. Follow-up echo showed LVEF 60-65%, mild LVH, G1DD, mild to mod MR, mild AI. Statin therapy was recommended, however she reported intolerance, and Zetia  was started.  Heart monitor for palpitations showed sinus rhythm with 1 run of SVT lasting 15 beats, multiple triggered events did not correlate with arrhythmia.  Echo in October 2024 showed normal LVSF, moderate MR and mild AI.  She was seen by Dr. Cage in December 2024 reporting substernal chest tightness and she was set up for right and left heart cath.  Heart cath showed heavily calcified coronary arteries with significant three-vessel CAD, normal LVSF.  Right heart cath showed normal RA pressure, moderate pulmonary hypertension, moderate  elevated wedge pressure and normal cardiac output..  It was recommended urgent outpatient evaluation for CABG.  Today, the patient reports she feels SOB and tired. She was having chest pain prior to the cath, but not any lately. No trouble breathing at night. No lower leg edema. She has been eating healthy food and lost about 30lbs. BP is a little high but she only had one BP med today. She takes midodrine  occasionally, mostly at HD. Cath site, right groin, is stable.  Past Medical History:  Diagnosis Date   Allergy    Anemia    Anxiety    worse when not at home - bowel incontinence   Arthritis    Ataxia    Chest pain    a. 10/2016 MV: EF 57%, no ischemia/infarct; b. 04/2022 MV: EF 43%, no ischemia/infarct. Heavy 3 vessel cor Ca2+ and mod aortic atherosclerosis.   COPD (chronic obstructive pulmonary disease) (HCC)    Depression    Diabetes mellitus with complication (HCC)    Diastolic dysfunction    a. 06/2016 Echo: EF 55-60%; b. 05/2017 Echo: EF 50-55%; c. 05/2018 Echo: EF 55-60%, no rwma, GrI DD, mild AI, mild-mod MR, mod dil LA. PASP ; d. 05/2022 Echo: EF 60-65%, no rwma, mild LVH, GrI DD, nl RV fxn, mild-mod dil LA, mild-mod MR, mild AI.   ESRD on hemodialysis [facility])    MWF dialysis   Fistula    left upper arm   Hepatitis 2003   Hep C   Hiatal hernia    Hypercholesterolemia    Hypertension    Migraine  Mitral regurgitation    a. 05/2022 Echo: mild-mod MR.   Neuropathy, diabetic (HCC)    lower legs   Non-alcoholic cirrhosis (HCC)    Palpitations    a. 06/2018 Zio: RSR, 74, rare PACs and PVCs.  No significant arrhythmia.   Peripheral vascular disease (HCC)    Pneumonia 2015   Psoriasis    Tobacco dependence    Wears dentures    full upper, partial lower    Past Surgical History:  Procedure Laterality Date   A/V FISTULAGRAM Left 04/23/2020   Procedure: A/V FISTULAGRAM;  Surgeon: Jama Cordella MATSU, MD;  Location: [facility];  Service: Cardiovascular;   Laterality: Left;   A/V SHUNT INTERVENTION N/A 12/16/2017   Procedure: A/V SHUNT INTERVENTION;  Surgeon: Marea Selinda RAMAN, MD;  Location: [facility];  Service: Cardiovascular;  Laterality: N/A;   AQUEOUS SHUNT Left 12/20/2019   Procedure: AHMED TUBE SHUNT WITH TUTOPLAST AND AC WASHOUT LEFT DIABETIC;  Surgeon: Mittie Gaskin, MD;  Location: [facility];  Service: Ophthalmology;  Laterality: Left;  Diabetic - insulin    AV FISTULA PLACEMENT Left 11/2014   CESAREAN SECTION     CHOLECYSTECTOMY     COLONOSCOPY N/A 09/22/2017   Procedure: COLONOSCOPY;  Surgeon: Unk Corinn Skiff, MD;  Location: [facility];  Service: Endoscopy;  Laterality: N/A;   COLONOSCOPY WITH PROPOFOL  N/A 04/16/2020   Procedure: COLONOSCOPY WITH PROPOFOL ;  Surgeon: Jinny Carmine, MD;  Location: [facility] ENDOS[facility];  Service: Endoscopy;  Laterality: N/A;  Priority 4   cyst removed  from left hand Left 1989   DIALYSIS/PERMA CATHETER INSERTION N/A 05/20/2017   Procedure: Dialysis/Perma Catheter Insertion and fistulagram/LUE angiogram;  Surgeon: Marea Selinda RAMAN, MD;  Location: [facility];  Service: Cardiovascular;  Laterality: N/A;   ESOPHAGOGASTRODUODENOSCOPY N/A 09/22/2017   Procedure: ESOPHAGOGASTRODUODENOSCOPY (EGD);  Surgeon: Unk Corinn Skiff, MD;  Location: [facility];  Service: Endoscopy;  Laterality: N/A;   ESOPHAGOGASTRODUODENOSCOPY (EGD) WITH PROPOFOL  N/A 12/04/2021   Procedure: ESOPHAGOGASTRODUODENOSCOPY (EGD) WITH PROPOFOL ;  Surgeon: Jinny Carmine, MD;  Location: [facility] END[facility];  Service: Endoscopy;  Laterality: N/A;   EYE SURGERY Right 09/02/2022   INCISION AND DRAINAGE ABSCESS N/A 07/16/2016   Procedure: INCISION AND DRAINAGE ABSCESS;  Surgeon: Carolee Hunter, MD;  Location: [facility] ORS;  Service: ENT;  Laterality: N/A;   PERIPHERAL VASCULAR CATHETERIZATION N/A 07/25/2015   Procedure: A/V Shuntogram/Fistulagram;  Surgeon: Selinda RAMAN Marea, MD;  Location: [facility];   Service: Cardiovascular;  Laterality: N/A;   PERIPHERAL VASCULAR CATHETERIZATION Left 07/25/2015   Procedure: A/V Shunt Intervention;  Surgeon: Selinda RAMAN Marea, MD;  Location: [facility];  Service: Cardiovascular;  Laterality: Left;   PERIPHERAL VASCULAR CATHETERIZATION Left 10/07/2015   Procedure: A/V Shuntogram/Fistulagram;  Surgeon: Selinda RAMAN Marea, MD;  Location: [facility];  Service: Cardiovascular;  Laterality: Left;   PERIPHERAL VASCULAR CATHETERIZATION N/A 10/07/2015   Procedure: A/V Shunt Intervention;  Surgeon: Selinda RAMAN Marea, MD;  Location: [facility];  Service: Cardiovascular;  Laterality: N/A;   PERIPHERAL VASCULAR CATHETERIZATION  10/07/2015   Procedure: Dialysis/Perma Catheter Insertion;  Surgeon: Selinda RAMAN Marea, MD;  Location: [facility];  Service: Cardiovascular;;   PERIPHERAL VASCULAR CATHETERIZATION N/A 12/17/2015   Procedure: Dialysis/Perma Catheter Removal;  Surgeon: Cordella MATSU Jama, MD;  Location: [facility];  Service: Cardiovascular;  Laterality: N/A;   PERIPHERAL VASCULAR CATHETERIZATION N/A 01/11/2017   Procedure: Visceral Angiography;  Surgeon: Selinda RAMAN Marea, MD;  Location:  [facility];  Service: Cardiovascular;  Laterality: N/A;   PERIPHERAL VASCULAR CATHETERIZATION N/A 01/11/2017   Procedure: Visceral Artery Intervention;  Surgeon: Selinda GORMAN Gu, MD;  Location: [facility];  Service: Cardiovascular;  Laterality: N/A;   POLYPECTOMY  09/22/2017   Procedure: POLYPECTOMY;  Surgeon: Unk Corinn Skiff, MD;  Location: [facility];  Service: Endoscopy;;   RIGHT/LEFT HEART CATH AND CORONARY ANGIOGRAPHY Bilateral 12/09/2023   Procedure: RIGHT/LEFT HEART CATH AND CORONARY ANGIOGRAPHY;  Surgeon: Darron Deatrice LABOR, MD;  Location: [facility];  Service: Cardiovascular;  Laterality: Bilateral;   rt. tubal and ovary removed     STENT PLACEMENT VASCULAR ([facility] HX) Left 03/2018   Performed at [facility] using EV3 protg GPS stent graph [id number] lot [id number]   TONSILLECTOMY     UPPER EXTREMITY ANGIOGRAPHY Left 09/29/2019   Procedure: UPPER EXTREMITY ANGIOGRAPHY;  Surgeon: Jama Cordella MATSU, MD;  Location: [facility];  Service: Cardiovascular;  Laterality: Left;    Current Medications: Current Meds  Medication Sig   aspirin  EC 81 MG tablet Take 1 tablet (81 mg total) by mouth daily. Swallow whole.   BD INSULIN  SYRINGE U/F 30G X 1/2 0.5 ML MISC USE 4 TIMES DAILY WITH LANTUS  & NOVOLOG    brimonidine (ALPHAGAN) 0.2 % ophthalmic solution Place 1 drop into the left eye in the morning and at bedtime.    calcium  carbonate (TUMS - DOSED IN MG ELEMENTAL CALCIUM ) 500 MG chewable tablet Chew by mouth See admin instructions. Take 1 tablet (500 mg) by mouth 3 times daily with meals & take 2 tablets (1000 mg) by mouth twice daily in between meals.   ciclopirox  (PENLAC ) 8 % solution Apply topically at bedtime. Apply over nail and surrounding skin. Apply daily over previous coat. After seven (7) days, may remove with alcohol and continue cycle.   diltiazem  (CARDIZEM ) 30 MG tablet Take 90 mg by mouth 4 (four) times daily.   dorzolamidel-timolol (COSOPT) 22.3-6.8 MG/ML SOLN ophthalmic solution Place 1 drop into the left eye 2 (two) times daily.    ibuprofen  (ADVIL ) 200 MG tablet Take 400 mg by mouth every 8 (eight) hours as needed (pain.).   insulin  aspart (NOVOLOG ) 100 UNIT/ML injection INJECT 5 TO 10 UNITS INTO THE SKIN 3 TIMES A DAY BEFORE MEALS   insulin  glargine (LANTUS ) 100 UNIT/ML injection INJECT 15 UNITS INTO THE SKIN DAILY.   leflunomide  (ARAVA ) 10 MG tablet Take 1 tablet (10 mg total) by mouth daily.   midodrine  (PROAMATINE ) 10 MG tablet TAKE 0.5-1 TABLETS (5-10 MG TOTAL) BY MOUTH DAILY AS NEEDED.   omeprazole  (PRILOSEC) 40 MG capsule TAKE 1 CAPSULE BY MOUTH EVERY DAY   [name] test strip USE TO CHECK BLOOD SUGAR 3 TIMES DAILY, FASTING IN MORNING WITH GOAL <130 AND 2  HOURS AFTER MEALS WITH GOAL <180. BRING BLOOD SUGAR LOG TO VISITS.   promethazine  (PHENERGAN ) 25 MG tablet Take 1 tablet (25 mg total) by mouth 2 (two) times daily as needed.   Vitamin D , Ergocalciferol , (DRISDOL) 1.25 MG (50000 UNIT) CAPS capsule Take 50,000 Units by mouth every Monday, Wednesday, and Friday.     Allergies:   Cinnamon, Garlic, Onion, Tylenol  [acetaminophen ], Losartan potassium, Amlodipine, Ciprofloxacin, Clonidine derivatives, Ginger, Hydralazine , Lisinopril , Metoprolol , Prednisone , and Statins   Social History   Socioeconomic History   Marital status: Divorced    Spouse name: Not on file   Number of children: Not on file   Years of education: Not  on file   Highest education level: Some college, no degree  Occupational History   Occupation: disabled  Tobacco Use   Smoking status: Former    Current packs/day: 0.00    Average packs/day: 0.3 packs/day for 20.0 years (5.0 ttl pk-yrs)    Types: Cigarettes    Start date: 08/13/2001    Quit date: 08/13/2021    Years since quitting: 2.3   Smokeless tobacco: Never  Vaping Use   Vaping status: Never Used  Substance and Sexual Activity   Alcohol use: No   Drug use: No   Sexual activity: Not on file  Other Topics Concern   Not on file  Social History Narrative   Patient lives at an extended stay hotel currently.  She lives with her boyfriend and son.  She does not routinely exercise.   Social Drivers of Health   Financial Resource Strain: Medium Risk (11/11/2023)   Received from [facility]   Overall Financial Resource Strain (CARDIA)    Difficulty of Paying Living Expenses: Somewhat hard  Food Insecurity: Food Insecurity Present (11/11/2023)   Received from [facility]   Hunger Vital Sign    Worried About Running Out of Food in the Last Year: Sometimes true    Ran Out of Food in the Last Year: Sometimes true  Transportation Needs: Unmet Transportation Needs (11/11/2023)    Received from [facility] - Transportation    In the past 12 months, has lack of transportation kept you from medical appointments or from getting medications?: Yes    Lack of Transportation (Non-Medical): Yes  Physical Activity: Unknown (10/07/2023)   Exercise Vital Sign    Days of Exercise per Week: 0 days    Minutes of Exercise per Session: Not on file  Stress: Stress Concern Present (10/07/2023)   [employer] of [employer] - Occupational Stress Questionnaire    Feeling of Stress : To some extent  Social Connections: Moderately Isolated (10/07/2023)   Social Connection and Isolation Panel [NHANES]    Frequency of Communication with Friends and Family: More than three times a week    Frequency of Social Gatherings with Friends and Family: Once a week    Attends Religious Services: Never    Database Administrator or Organizations: No    Attends Engineer, Structural: Not on file    Marital Status: Living with partner     Family History: The patient's family history includes Heart disease in her mother; Hypertension in her father and mother; Skin cancer in her father.  ROS:   Please see the history of present illness.    All other systems reviewed and are negative.  EKGs/Labs/Other Studies Reviewed:    The following studies were reviewed today:  R/L heart cath 11/2023    2nd Mrg lesion is 70% stenosed.   Dist Cx lesion is 60% stenosed.   Prox RCA lesion is 60% stenosed.   Mid RCA lesion is 99% stenosed.   Dist RCA lesion is 99% stenosed.   1st Mrg lesion is 70% stenosed.   Mid LAD lesion is 95% stenosed.   Prox LAD lesion is 70% stenosed.   The left ventricular systolic function is normal.   LV end diastolic pressure is moderately elevated.   The left ventricular ejection fraction is 55-65% by visual estimate.   1.  Heavily calcified coronary arteries with significant three-vessel coronary artery disease. 2.  Normal LV  systolic function.  3.  Right heart catheterization showed normal RA pressure, moderate pulmonary hypertension, moderately elevated wedge pressure and normal cardiac output.   RA: 10 mmHg RV: 56/5 mmHg PW: 20 mmHg with no significant V waves. PA: 52/24 with a mean of 34 mmHg Cardiac output is 4.92 with an index of 2.42.   Recommendations: Recommend urgent outpatient evaluation for CABG. She is intolerant to statins and will require evaluation for treatment with a PCSK9 and repeat her after surgery.  Echo 09/2023 1. Left ventricular ejection fraction, by estimation, is 60 to 65%. The  left ventricle has normal function. The left ventricle demonstrates  regional wall motion abnormalities (basal inferior wall hypokinesis).  There is mild left ventricular hypertrophy.   Left ventricular diastolic parameters are consistent with Grade I  diastolic dysfunction (impaired relaxation). The average left ventricular  global longitudinal strain is -16.7 %.   2. Right ventricular systolic function is normal. The right ventricular  size is normal. Tricuspid regurgitation signal is inadequate for assessing  PA pressure.   3. Left atrial size was severely dilated.   4. The mitral valve is normal in structure. Moderate mitral valve  regurgitation. No evidence of mitral stenosis.   5. The aortic valve is tricuspid. Aortic valve regurgitation is mild.  Aortic valve sclerosis is present, with no evidence of aortic valve  stenosis.   6. The inferior vena cava is normal in size with greater than 50%  respiratory variability, suggesting right atrial pressure of 3 mmHg.   Heart monitor 06/2023   Patch Wear Time:  6 days and 11 hours (2024-06-22T09:07:05-399 to 2024-06-28T20:39:58-0400) Patient had a min HR of 67 bpm, max HR of 154 bpm, and avg HR of 81 bpm. Predominant underlying rhythm was Sinus Rhythm. Intermittent Bundle Branch Block was present. 1 run of Supraventricular Tachycardia occurred lasting 15  beats with a max rate of 154 bpm (avg 124 bpm).  Rare PACs and rare PVCs. Most triggered events did not correlate with arrhythmia.  EKG:  EKG is not ordered today.    Recent Labs: 11/12/2023: ALT 16; Magnesium 1.7; TSH 0.905 12/08/2023: BUN 24; Creatinine, Ser 3.55; Platelets 147 12/09/2023: Hemoglobin 10.9; Hemoglobin 10.9; Potassium 4.9; Potassium 4.8; Sodium 137; Sodium 137  Recent Lipid Panel    Component Value Date/Time   CHOL 175 11/12/2023 1347   CHOL 157 10/15/2014 0402   TRIG 158 (H) 11/12/2023 1347   TRIG 83 10/15/2014 0402   HDL 32 (L) 11/12/2023 1347   HDL 49 10/15/2014 0402   VLDL 17 10/15/2014 0402   LDLCALC 115 (H) 11/12/2023 1347   LDLCALC 91 10/15/2014 0402   Physical Exam:    VS:  BP (!) 159/85   Pulse 81   Ht 5' 6 (1.676 m)   Wt 208 lb 12.8 oz (94.7 kg)   SpO2 98%   BMI 33.70 kg/m     Wt Readings from Last 3 Encounters:  12/30/23 208 lb 12.8 oz (94.7 kg)  12/24/23 207 lb (93.9 kg)  12/09/23 211 lb 12.8 oz (96.1 kg)     GEN:  Well nourished, well developed in no acute distress HEENT: Normal NECK: No JVD; No carotid bruits LYMPHATICS: No lymphadenopathy CARDIAC: RRR, no murmurs, rubs, gallops RESPIRATORY:  Clear to auscultation without rales, wheezing or rhonchi  ABDOMEN: Soft, non-tender, non-distended MUSCULOSKELETAL:  No edema; No deformity  SKIN: Warm and dry NEUROLOGIC:  Alert and oriented x 3 PSYCHIATRIC:  Normal affect   ASSESSMENT:    1. Coronary artery disease involving  native coronary artery of native heart with unstable angina pectoris (HCC)   2. Hyperlipidemia, mixed   3. ESRD (end stage renal disease) on dialysis (HCC)   4. Essential hypertension   5. Orthostatic hypotension    PLAN:    In order of problems listed above:  3V CAD Heart cath showed severe 3V CAD, normal LVEF. RHC showed normal RA pressure, moderate pulmonary HTN, moderately elevated wedge pressure and normal cardiac output. The patient saw CTS for urgent  visit and is scheduled to undergo CABG 01/06/23. Cath site is stable. She denies chest pain, but reports generalized fatigue and shortness of breath at times. She appears euvolemic on exam. Continue ASA. I will start Repatha . We will see her after surgery.  HLD LDL 115. She has a h/o myalgias with statins. I will start Repatha .   ESRD on HD Patient occasionally requires midodrine  on dialysis days.   HTN Orthostatic hypotension She takes diltiazem  30mg  QID as well as midodrine  PRN. BP elevated today, but she only has taken one 30mg  diltiazem  pill. No changes today. We will reassess after surgery.  Disposition: Follow up in 2 month(s) with MD/APP    Signed, Chellsea Beckers VEAR Fishman, PA-C  12/30/2023 8:35 AM    [facility]

## 2023-12-30 ENCOUNTER — Ambulatory Visit: Payer: 59 | Attending: Medical | Admitting: Medical

## 2023-12-30 ENCOUNTER — Encounter: Payer: Self-pay | Admitting: Medical

## 2023-12-30 ENCOUNTER — Encounter: Payer: Self-pay | Admitting: Student in an Organized Health Care Education/Training Program

## 2023-12-30 VITALS — BP 159/85 | HR 81 | Ht 66.0 in | Wt 208.8 lb

## 2023-12-30 DIAGNOSIS — E782 Mixed hyperlipidemia: Secondary | ICD-10-CM | POA: Diagnosis not present

## 2023-12-30 DIAGNOSIS — I10 Essential (primary) hypertension: Secondary | ICD-10-CM

## 2023-12-30 DIAGNOSIS — I25110 Atherosclerotic heart disease of native coronary artery with unstable angina pectoris: Secondary | ICD-10-CM

## 2023-12-30 DIAGNOSIS — N186 End stage renal disease: Secondary | ICD-10-CM | POA: Diagnosis not present

## 2023-12-30 DIAGNOSIS — Z992 Dependence on renal dialysis: Secondary | ICD-10-CM

## 2023-12-30 DIAGNOSIS — I951 Orthostatic hypotension: Secondary | ICD-10-CM

## 2023-12-30 MED ORDER — REPATHA SURECLICK 140 MG/ML ~~LOC~~ SOAJ: 140.0000 mg | SUBCUTANEOUS | 6 refills | Status: DC

## 2023-12-30 NOTE — Patient Instructions (Addendum)
 Medication Instructions:  Your physician recommends the following medication changes.  START TAKING: Repatha  1 injection every 14 days  *If you need a refill on your cardiac medications before your next appointment, please call your pharmacy*   Lab Work: None ordered at this time  If you have labs (blood work) drawn today and your tests are completely normal, you will receive your results only by: MyChart Message (if you have MyChart) OR A paper copy in the mail If you have any lab test that is abnormal or we need to change your treatment, we will call you to review the results.   Testing/Procedures: None ordered at this time    Follow-Up: At [facility], you and your health needs are our priority.  As part of our continuing mission to provide you with exceptional heart care, we have created designated Provider Care Teams.  These Care Teams include your primary Cardiologist (physician) and Advanced Practice Providers (APPs -  Physician Assistants and Nurse Practitioners) who all work together to provide you with the care you need, when you need it.  We recommend signing up for the patient portal called MyChart.  Sign up information is provided on this After Visit Summary.  MyChart is used to connect with patients for Virtual Visits (Telemedicine).  Patients are able to view lab/test results, encounter notes, upcoming appointments, etc.  Non-urgent messages can be sent to your provider as well.   To learn more about what you can do with MyChart, go to [email or web address].    Your next appointment:   2 month(s)  Provider:   You may see Deatrice Cage, MD or one of the following Advanced Practice Providers on your designated Care Team:   Lonni Meager, NP Bernardino Bring, PA-C Cadence Franchester, PA-C Tylene Lunch, NP Barnie Hila, NP

## 2023-12-31 ENCOUNTER — Other Ambulatory Visit: Payer: Self-pay | Admitting: Nurse Practitioner

## 2024-01-03 NOTE — Telephone Encounter (Signed)
 Requested Prescriptions  Pending Prescriptions Disp Refills   BD INSULIN  SYRINGE U/F 30G X 1/2 0.5 ML MISC [Pharmacy Med Name: BD INS SYR UF 0.5ML 12.7MMX30G] 100 each 3    Sig: USE 4 TIMES DAILY WITH LANTUS  & NOVOLOG      There is no refill protocol information for this order

## 2024-01-06 ENCOUNTER — Other Ambulatory Visit: Payer: Self-pay | Admitting: Nurse Practitioner

## 2024-01-06 ENCOUNTER — Encounter (HOSPITAL_COMMUNITY): Payer: Self-pay | Admitting: Anesthesiology

## 2024-01-06 ENCOUNTER — Encounter: Payer: Self-pay | Admitting: Nurse Practitioner

## 2024-01-06 MED ORDER — BD INSULIN SYRINGE U/F 30G X 1/2" 0.5 ML MISC: 3 refills | Status: DC

## 2024-01-07 ENCOUNTER — Inpatient Hospital Stay: Admit: 2024-01-07 | Payer: 59 | Admitting: Thoracic Surgery (Cardiothoracic Vascular Surgery)

## 2024-01-08 ENCOUNTER — Emergency Department (HOSPITAL_COMMUNITY)
Admission: EM | Admit: 2024-01-08 | Discharge: 2024-01-08 | Discharge status: Against Medical Advice | Payer: 59 | Attending: Emergency Medicine | Admitting: Emergency Medicine

## 2024-01-08 ENCOUNTER — Encounter (HOSPITAL_COMMUNITY): Payer: Self-pay

## 2024-01-08 ENCOUNTER — Other Ambulatory Visit: Payer: Self-pay

## 2024-01-08 DIAGNOSIS — N186 End stage renal disease: Secondary | ICD-10-CM | POA: Diagnosis present

## 2024-01-08 DIAGNOSIS — Z992 Dependence on renal dialysis: Secondary | ICD-10-CM | POA: Insufficient documentation

## 2024-01-08 DIAGNOSIS — I2510 Atherosclerotic heart disease of native coronary artery without angina pectoris: Secondary | ICD-10-CM | POA: Insufficient documentation

## 2024-01-08 DIAGNOSIS — Z5321 Procedure and treatment not carried out due to patient leaving prior to being seen by health care provider: Secondary | ICD-10-CM | POA: Diagnosis not present

## 2024-01-08 DIAGNOSIS — Z951 Presence of aortocoronary bypass graft: Secondary | ICD-10-CM | POA: Insufficient documentation

## 2024-01-08 LAB — BASIC METABOLIC PANEL
Anion gap: 15 (ref 5–15)
BUN: 33 mg/dL — ABNORMAL HIGH (ref 6–20)
CO2: 23 mmol/L (ref 22–32)
Calcium: 9.4 mg/dL (ref 8.9–10.3)
Chloride: 98 mmol/L (ref 98–111)
Creatinine, Ser: 5.36 mg/dL — ABNORMAL HIGH (ref 0.44–1.00)
GFR, Estimated: 9 mL/min — ABNORMAL LOW (ref >60)
Glucose, Bld: 230 mg/dL — ABNORMAL HIGH (ref 70–99)
Potassium: 5.3 mmol/L — ABNORMAL HIGH (ref 3.5–5.1)
Sodium: 136 mmol/L (ref 135–145)

## 2024-01-08 LAB — CBC
HCT: 42.2 % (ref 36.0–46.0)
Hemoglobin: 13.1 g/dL (ref 12.0–15.0)
MCH: 31 pg (ref 26.0–34.0)
MCHC: 31 g/dL (ref 30.0–36.0)
MCV: 100 fL (ref 80.0–100.0)
Platelets: 181 10*3/uL (ref 150–400)
RBC: 4.22 MIL/uL (ref 3.87–5.11)
RDW: 14.1 % (ref 11.5–15.5)
WBC: 7.8 10*3/uL (ref 4.0–10.5)
nRBC: 0 % (ref 0.0–0.2)

## 2024-01-08 LAB — TROPONIN I (HIGH SENSITIVITY): Troponin I (High Sensitivity): 496 ng/L (ref <18)

## 2024-01-08 NOTE — ED Notes (Signed)
Pt left lobby AMA.

## 2024-01-08 NOTE — ED Triage Notes (Signed)
Pt to ED pov. Pt states she was told to come to the ED to be prepped for open heart surgery on Tuesday. Pt denies pain other than her normal pain, nausea or vomiting.

## 2024-01-08 NOTE — ED Provider Notes (Signed)
This is a 58 year old female with history of three-vessel coronary artery disease with planned CABG and ESRD on dialysis.  Patient presented to triage today.  Per triage notes and report from triage nurse, patient presented as she was told by staff with the cardiothoracic surgery office that she needed to come into the ED yesterday to be admitted.  She had dialysis yesterday and had diarrhea afterwards so did not feel well and came in today.  She was told to go directly to the emergency department to be admitted in preparation for CABG next week on the 21st.  I see a note from January 3 that states there was plan for admission but I do not see clear plan of who is going to admit her and when.  Patient left prior to being evaluated by provider.  I was called due to troponin coming back at 496.  I called patient back at her phone.  Reviewed her EKG, shows nonspecific intraventricular block but no STEMI.  She states she is not having any chest pain or shortness of breath and feels okay right now.  Her and her husband are very frustrated about the confusion.  She denies any manic, however given her significant troponinemia which is markedly elevated from prior I told her that I thought she needed to be evaluated medically and likely admitted from medical standpoint although this will depend on her evaluation and further discussion with cardiology and cardiothoracic surgery after she is evaluated.  She was agreeable to coming back to the emergency department, her husband is going to take her back.  I encouraged her to come back as quickly as possible.  Discussed with nursing staff to expedite patient's bedding after she gets here.   Laurence Spates, MD 01/08/24 1009

## 2024-01-08 NOTE — ED Notes (Signed)
Received critical value, trop=496. Pt had left from the lobby prior to being seen. Spoke w/Drs. Dorris Fetch and Callao. Dr. Earlene Plater called pt and spoke w/pt and pt's husband and asked pt to come back. Patient unsure if she will return.

## 2024-01-10 ENCOUNTER — Other Ambulatory Visit (HOSPITAL_COMMUNITY): Payer: 59

## 2024-01-10 ENCOUNTER — Other Ambulatory Visit: Payer: Self-pay | Admitting: *Deleted

## 2024-01-10 ENCOUNTER — Ambulatory Visit (HOSPITAL_COMMUNITY): Payer: 59

## 2024-01-10 DIAGNOSIS — I2510 Atherosclerotic heart disease of native coronary artery without angina pectoris: Secondary | ICD-10-CM

## 2024-01-10 MED ORDER — TRANEXAMIC ACID (OHS) BOLUS VIA INFUSION: 15.0000 mg/kg | INTRAVENOUS | Status: DC

## 2024-01-10 MED ORDER — CEFAZOLIN SODIUM-DEXTROSE 2-4 GM/100ML-% IV SOLN: 2.0000 g | INTRAVENOUS | Status: DC

## 2024-01-10 MED ORDER — EPINEPHRINE HCL 5 MG/250ML IV SOLN IN NS: 0.0000 ug/min | INTRAVENOUS | Status: DC

## 2024-01-10 MED ORDER — VANCOMYCIN HCL 1.5 G IV SOLR: 1500.0000 mg | INTRAVENOUS | Status: DC

## 2024-01-10 MED ORDER — NOREPINEPHRINE 4 MG/250ML-% IV SOLN: 0.0000 ug/min | INTRAVENOUS | Status: DC

## 2024-01-10 MED ORDER — MANNITOL 20 % IV SOLN: INTRAVENOUS | Status: DC

## 2024-01-10 MED ORDER — HEPARIN 30,000 UNITS/1000 ML (OHS) CELLSAVER SOLUTION: Status: DC

## 2024-01-10 MED ORDER — PLASMA-LYTE A IV SOLN: INTRAVENOUS | Status: DC

## 2024-01-10 MED ORDER — INSULIN REGULAR(HUMAN) IN NACL 100-0.9 UT/100ML-% IV SOLN: INTRAVENOUS | Status: DC

## 2024-01-10 MED ORDER — PHENYLEPHRINE HCL-NACL 20-0.9 MG/250ML-% IV SOLN: 30.0000 ug/min | INTRAVENOUS | Status: DC

## 2024-01-10 MED ORDER — NITROGLYCERIN IN D5W 200-5 MCG/ML-% IV SOLN: 2.0000 ug/min | INTRAVENOUS | Status: DC

## 2024-01-10 MED ORDER — POTASSIUM CHLORIDE 2 MEQ/ML IV SOLN: 80.0000 meq | INTRAVENOUS | Status: DC

## 2024-01-10 MED ORDER — TRANEXAMIC ACID (OHS) PUMP PRIME SOLUTION: 2.0000 mg/kg | INTRAVENOUS | Status: DC

## 2024-01-10 MED ORDER — DEXMEDETOMIDINE HCL IN NACL 400 MCG/100ML IV SOLN: 0.1000 ug/kg/h | INTRAVENOUS | Status: DC

## 2024-01-10 MED ORDER — TRANEXAMIC ACID 1000 MG/10ML IV SOLN: 1.5000 mg/kg/h | INTRAVENOUS | Status: DC

## 2024-01-10 MED ORDER — MILRINONE LACTATE IN DEXTROSE 20-5 MG/100ML-% IV SOLN: 0.3000 ug/kg/min | INTRAVENOUS | Status: DC

## 2024-01-11 SURGERY — CORONARY ARTERY BYPASS GRAFTING (CABG)
Anesthesia: General | Site: Chest

## 2024-01-13 ENCOUNTER — Telehealth: Payer: Self-pay

## 2024-01-13 ENCOUNTER — Other Ambulatory Visit (HOSPITAL_COMMUNITY): Payer: Self-pay

## 2024-01-13 NOTE — Telephone Encounter (Signed)
Pharmacy Patient Advocate Encounter   Received notification from CoverMyMeds that prior authorization for REPATHA is required/requested.   Insurance verification completed.   The patient is insured through [facility] .   Per test claim: PA required; PA submitted to above mentioned insurance via CoverMyMeds Key/confirmation #/EOC [id number] Status is pending

## 2024-01-14 ENCOUNTER — Other Ambulatory Visit (HOSPITAL_COMMUNITY): Payer: Self-pay

## 2024-01-14 NOTE — Telephone Encounter (Signed)
Pharmacy Patient Advocate Encounter  Received notification from [facility] that Prior Authorization for REPATHA has been APPROVED from 01/13/24 to 07/12/24. Ran test claim, Copay is $0. This test claim was processed through [facility]- copay amounts may vary at other pharmacies due to pharmacy/plan contracts, or as the patient moves through the different stages of their insurance plan.

## 2024-03-02 ENCOUNTER — Ambulatory Visit: Payer: 59 | Admitting: Medical
